# Patient Record
Sex: Male | Born: 1960 | Race: White | Hispanic: No | Smoking: Current every day smoker
Health system: Southern US, Community
[De-identification: ages and names within clinical notes are randomized; demographics above are authoritative.]

## PROBLEM LIST (undated history)

## (undated) DIAGNOSIS — I639 Cerebral infarction, unspecified: Secondary | ICD-10-CM

## (undated) DIAGNOSIS — N189 Chronic kidney disease, unspecified: Secondary | ICD-10-CM

## (undated) DIAGNOSIS — J9601 Acute respiratory failure with hypoxia: Secondary | ICD-10-CM

## (undated) DIAGNOSIS — I214 Non-ST elevation (NSTEMI) myocardial infarction: Secondary | ICD-10-CM

## (undated) DIAGNOSIS — I509 Heart failure, unspecified: Secondary | ICD-10-CM

## (undated) DIAGNOSIS — F329 Major depressive disorder, single episode, unspecified: Secondary | ICD-10-CM

## (undated) DIAGNOSIS — F319 Bipolar disorder, unspecified: Secondary | ICD-10-CM

## (undated) DIAGNOSIS — Z720 Tobacco use: Secondary | ICD-10-CM

## (undated) DIAGNOSIS — I255 Ischemic cardiomyopathy: Secondary | ICD-10-CM

## (undated) DIAGNOSIS — N183 Chronic kidney disease, stage 3 unspecified: Secondary | ICD-10-CM

## (undated) DIAGNOSIS — I5022 Chronic systolic (congestive) heart failure: Secondary | ICD-10-CM

## (undated) DIAGNOSIS — J449 Chronic obstructive pulmonary disease, unspecified: Secondary | ICD-10-CM

## (undated) DIAGNOSIS — R569 Unspecified convulsions: Secondary | ICD-10-CM

## (undated) DIAGNOSIS — I219 Acute myocardial infarction, unspecified: Secondary | ICD-10-CM

## (undated) DIAGNOSIS — I10 Essential (primary) hypertension: Secondary | ICD-10-CM

## (undated) DIAGNOSIS — J810 Acute pulmonary edema: Secondary | ICD-10-CM

## (undated) DIAGNOSIS — I2510 Atherosclerotic heart disease of native coronary artery without angina pectoris: Secondary | ICD-10-CM

## (undated) DIAGNOSIS — F32A Depression, unspecified: Secondary | ICD-10-CM

## (undated) DIAGNOSIS — Z95811 Presence of heart assist device: Secondary | ICD-10-CM

## (undated) HISTORY — DX: Chronic kidney disease, unspecified: N18.9

## (undated) HISTORY — PX: CORONARY ARTERY BYPASS GRAFT: SHX141

## (undated) HISTORY — DX: Heart failure, unspecified: I50.9

---

## 2012-07-13 DIAGNOSIS — S42002A Fracture of unspecified part of left clavicle, initial encounter for closed fracture: Secondary | ICD-10-CM | POA: Insufficient documentation

## 2015-07-09 DIAGNOSIS — J810 Acute pulmonary edema: Secondary | ICD-10-CM

## 2015-07-09 DIAGNOSIS — J9601 Acute respiratory failure with hypoxia: Secondary | ICD-10-CM

## 2015-07-09 HISTORY — DX: Acute pulmonary edema: J81.0

## 2015-07-09 HISTORY — DX: Acute respiratory failure with hypoxia: J96.01

## 2015-12-29 DIAGNOSIS — I10 Essential (primary) hypertension: Secondary | ICD-10-CM | POA: Insufficient documentation

## 2015-12-29 DIAGNOSIS — R7303 Prediabetes: Secondary | ICD-10-CM | POA: Insufficient documentation

## 2016-01-04 DIAGNOSIS — I340 Nonrheumatic mitral (valve) insufficiency: Secondary | ICD-10-CM | POA: Insufficient documentation

## 2016-01-06 DIAGNOSIS — Z95811 Presence of heart assist device: Secondary | ICD-10-CM

## 2016-01-06 HISTORY — DX: Presence of heart assist device: Z95.811

## 2016-08-09 DIAGNOSIS — I739 Peripheral vascular disease, unspecified: Secondary | ICD-10-CM | POA: Insufficient documentation

## 2016-08-09 DIAGNOSIS — F322 Major depressive disorder, single episode, severe without psychotic features: Secondary | ICD-10-CM | POA: Insufficient documentation

## 2016-08-09 DIAGNOSIS — F1921 Other psychoactive substance dependence, in remission: Secondary | ICD-10-CM | POA: Insufficient documentation

## 2016-08-09 DIAGNOSIS — F1011 Alcohol abuse, in remission: Secondary | ICD-10-CM | POA: Insufficient documentation

## 2016-08-28 DIAGNOSIS — I739 Peripheral vascular disease, unspecified: Secondary | ICD-10-CM | POA: Insufficient documentation

## 2016-11-19 DIAGNOSIS — E785 Hyperlipidemia, unspecified: Secondary | ICD-10-CM | POA: Insufficient documentation

## 2017-02-19 DIAGNOSIS — Z9181 History of falling: Secondary | ICD-10-CM | POA: Insufficient documentation

## 2017-06-20 DIAGNOSIS — W19XXXA Unspecified fall, initial encounter: Secondary | ICD-10-CM | POA: Insufficient documentation

## 2017-06-22 DIAGNOSIS — R338 Other retention of urine: Secondary | ICD-10-CM | POA: Insufficient documentation

## 2017-06-22 DIAGNOSIS — N401 Enlarged prostate with lower urinary tract symptoms: Secondary | ICD-10-CM | POA: Insufficient documentation

## 2017-06-26 DIAGNOSIS — I63512 Cerebral infarction due to unspecified occlusion or stenosis of left middle cerebral artery: Secondary | ICD-10-CM | POA: Insufficient documentation

## 2017-08-05 DIAGNOSIS — Z79899 Other long term (current) drug therapy: Secondary | ICD-10-CM | POA: Insufficient documentation

## 2017-10-11 DIAGNOSIS — R0789 Other chest pain: Secondary | ICD-10-CM | POA: Insufficient documentation

## 2018-09-17 DIAGNOSIS — M25562 Pain in left knee: Secondary | ICD-10-CM | POA: Insufficient documentation

## 2018-09-17 DIAGNOSIS — G8929 Other chronic pain: Secondary | ICD-10-CM | POA: Insufficient documentation

## 2018-11-03 ENCOUNTER — Inpatient Hospital Stay
Admission: EM | Admit: 2018-11-03 | Discharge: 2018-11-04 | DRG: 291 | Disposition: A | Discharge status: Home Health | Payer: Medicaid Other | Attending: Internal Medicine | Admitting: Internal Medicine

## 2018-11-03 ENCOUNTER — Inpatient Hospital Stay
Admit: 2018-11-03 | Discharge: 2018-11-03 | Disposition: A | Discharge status: Home / Self Care | Payer: Medicaid Other | Attending: Family Medicine | Admitting: Family Medicine

## 2018-11-03 ENCOUNTER — Other Ambulatory Visit: Payer: Self-pay

## 2018-11-03 ENCOUNTER — Emergency Department: Payer: Medicaid Other

## 2018-11-03 DIAGNOSIS — R778 Other specified abnormalities of plasma proteins: Secondary | ICD-10-CM

## 2018-11-03 DIAGNOSIS — Z8673 Personal history of transient ischemic attack (TIA), and cerebral infarction without residual deficits: Secondary | ICD-10-CM | POA: Diagnosis not present

## 2018-11-03 DIAGNOSIS — R0602 Shortness of breath: Secondary | ICD-10-CM | POA: Diagnosis present

## 2018-11-03 DIAGNOSIS — I447 Left bundle-branch block, unspecified: Secondary | ICD-10-CM | POA: Diagnosis present

## 2018-11-03 DIAGNOSIS — I2781 Cor pulmonale (chronic): Secondary | ICD-10-CM | POA: Diagnosis present

## 2018-11-03 DIAGNOSIS — Z79890 Hormone replacement therapy (postmenopausal): Secondary | ICD-10-CM

## 2018-11-03 DIAGNOSIS — E039 Hypothyroidism, unspecified: Secondary | ICD-10-CM | POA: Diagnosis present

## 2018-11-03 DIAGNOSIS — I252 Old myocardial infarction: Secondary | ICD-10-CM

## 2018-11-03 DIAGNOSIS — F17200 Nicotine dependence, unspecified, uncomplicated: Secondary | ICD-10-CM | POA: Diagnosis present

## 2018-11-03 DIAGNOSIS — J9621 Acute and chronic respiratory failure with hypoxia: Secondary | ICD-10-CM | POA: Diagnosis present

## 2018-11-03 DIAGNOSIS — Z91012 Allergy to eggs: Secondary | ICD-10-CM

## 2018-11-03 DIAGNOSIS — Z880 Allergy status to penicillin: Secondary | ICD-10-CM

## 2018-11-03 DIAGNOSIS — Z91018 Allergy to other foods: Secondary | ICD-10-CM

## 2018-11-03 DIAGNOSIS — Z881 Allergy status to other antibiotic agents status: Secondary | ICD-10-CM

## 2018-11-03 DIAGNOSIS — J9602 Acute respiratory failure with hypercapnia: Secondary | ICD-10-CM | POA: Diagnosis present

## 2018-11-03 DIAGNOSIS — I110 Hypertensive heart disease with heart failure: Principal | ICD-10-CM | POA: Diagnosis present

## 2018-11-03 DIAGNOSIS — R739 Hyperglycemia, unspecified: Secondary | ICD-10-CM | POA: Diagnosis present

## 2018-11-03 DIAGNOSIS — J9601 Acute respiratory failure with hypoxia: Secondary | ICD-10-CM | POA: Diagnosis present

## 2018-11-03 DIAGNOSIS — Z951 Presence of aortocoronary bypass graft: Secondary | ICD-10-CM

## 2018-11-03 DIAGNOSIS — E785 Hyperlipidemia, unspecified: Secondary | ICD-10-CM | POA: Diagnosis present

## 2018-11-03 DIAGNOSIS — R7989 Other specified abnormal findings of blood chemistry: Secondary | ICD-10-CM

## 2018-11-03 DIAGNOSIS — J441 Chronic obstructive pulmonary disease with (acute) exacerbation: Secondary | ICD-10-CM | POA: Diagnosis present

## 2018-11-03 DIAGNOSIS — N179 Acute kidney failure, unspecified: Secondary | ICD-10-CM | POA: Diagnosis present

## 2018-11-03 DIAGNOSIS — I2510 Atherosclerotic heart disease of native coronary artery without angina pectoris: Secondary | ICD-10-CM | POA: Diagnosis present

## 2018-11-03 DIAGNOSIS — I5043 Acute on chronic combined systolic (congestive) and diastolic (congestive) heart failure: Secondary | ICD-10-CM | POA: Diagnosis present

## 2018-11-03 DIAGNOSIS — R569 Unspecified convulsions: Secondary | ICD-10-CM | POA: Diagnosis present

## 2018-11-03 DIAGNOSIS — Z20828 Contact with and (suspected) exposure to other viral communicable diseases: Secondary | ICD-10-CM | POA: Diagnosis present

## 2018-11-03 DIAGNOSIS — Z95810 Presence of automatic (implantable) cardiac defibrillator: Secondary | ICD-10-CM | POA: Diagnosis not present

## 2018-11-03 DIAGNOSIS — J9600 Acute respiratory failure, unspecified whether with hypoxia or hypercapnia: Secondary | ICD-10-CM | POA: Diagnosis present

## 2018-11-03 DIAGNOSIS — I4892 Unspecified atrial flutter: Secondary | ICD-10-CM | POA: Diagnosis present

## 2018-11-03 DIAGNOSIS — Z91041 Radiographic dye allergy status: Secondary | ICD-10-CM

## 2018-11-03 DIAGNOSIS — F319 Bipolar disorder, unspecified: Secondary | ICD-10-CM | POA: Diagnosis present

## 2018-11-03 DIAGNOSIS — I255 Ischemic cardiomyopathy: Secondary | ICD-10-CM | POA: Diagnosis present

## 2018-11-03 DIAGNOSIS — Z882 Allergy status to sulfonamides status: Secondary | ICD-10-CM

## 2018-11-03 DIAGNOSIS — Z955 Presence of coronary angioplasty implant and graft: Secondary | ICD-10-CM

## 2018-11-03 DIAGNOSIS — I4891 Unspecified atrial fibrillation: Secondary | ICD-10-CM | POA: Diagnosis present

## 2018-11-03 DIAGNOSIS — I248 Other forms of acute ischemic heart disease: Secondary | ICD-10-CM | POA: Diagnosis present

## 2018-11-03 DIAGNOSIS — D649 Anemia, unspecified: Secondary | ICD-10-CM | POA: Diagnosis present

## 2018-11-03 DIAGNOSIS — I509 Heart failure, unspecified: Secondary | ICD-10-CM

## 2018-11-03 DIAGNOSIS — E872 Acidosis: Secondary | ICD-10-CM | POA: Diagnosis present

## 2018-11-03 HISTORY — DX: Chronic obstructive pulmonary disease, unspecified: J44.9

## 2018-11-03 HISTORY — DX: Tobacco use: Z72.0

## 2018-11-03 HISTORY — DX: Non-ST elevation (NSTEMI) myocardial infarction: I21.4

## 2018-11-03 HISTORY — DX: Cerebral infarction, unspecified: I63.9

## 2018-11-03 HISTORY — DX: Essential (primary) hypertension: I10

## 2018-11-03 HISTORY — DX: Depression, unspecified: F32.A

## 2018-11-03 HISTORY — DX: Bipolar disorder, unspecified: F31.9

## 2018-11-03 HISTORY — DX: Presence of heart assist device: Z95.811

## 2018-11-03 HISTORY — DX: Acute myocardial infarction, unspecified: I21.9

## 2018-11-03 HISTORY — DX: Unspecified convulsions: R56.9

## 2018-11-03 HISTORY — DX: Major depressive disorder, single episode, unspecified: F32.9

## 2018-11-03 HISTORY — DX: Acute respiratory failure with hypoxia: J96.01

## 2018-11-03 HISTORY — DX: Acute pulmonary edema: J81.0

## 2018-11-03 LAB — RESPIRATORY PANEL BY PCR

## 2018-11-03 LAB — PROCALCITONIN: Procalcitonin: <0.1 ng/mL

## 2018-11-03 LAB — LIPASE, BLOOD: Lipase: 38 U/L (ref 11–51)

## 2018-11-03 LAB — BLOOD GAS, VENOUS
Acid-base deficit: 19.7 mmol/L — ABNORMAL HIGH (ref 0.0–2.0)
Bicarbonate: 13.8 mmol/L — ABNORMAL LOW (ref 20.0–28.0)
Delivery systems: POSITIVE
FIO2: 0.7
O2 Saturation: 48.2 %
Patient temperature: 37
RATE: 10 resp/min
pCO2, Ven: 69 mmHg — ABNORMAL HIGH (ref 44.0–60.0)
pH, Ven: 6.91 — CL (ref 7.250–7.430)
pO2, Ven: 46 mmHg — ABNORMAL HIGH (ref 32.0–45.0)

## 2018-11-03 LAB — TROPONIN I
Troponin I: 0.05 ng/mL (ref <0.03)
Troponin I: 0.11 ng/mL (ref <0.03)
Troponin I: 0.08 ng/mL (ref <0.03)
Troponin I: 0.08 ng/mL (ref <0.03)

## 2018-11-03 LAB — TRIGLYCERIDES: Triglycerides: 220 mg/dL — ABNORMAL HIGH (ref <150)

## 2018-11-03 LAB — FIBRINOGEN: Fibrinogen: 355 mg/dL (ref 210–475)

## 2018-11-03 LAB — CBC
HCT: 33.6 % — ABNORMAL LOW (ref 39.0–52.0)
Hemoglobin: 11 g/dL — ABNORMAL LOW (ref 13.0–17.0)
MCH: 29.7 pg (ref 26.0–34.0)
MCHC: 32.7 g/dL (ref 30.0–36.0)
MCV: 90.8 fL (ref 80.0–100.0)
Platelets: 203 10*3/uL (ref 150–400)
RBC: 3.7 MIL/uL — ABNORMAL LOW (ref 4.22–5.81)
RDW: 15.9 % — ABNORMAL HIGH (ref 11.5–15.5)
WBC: 9.9 10*3/uL (ref 4.0–10.5)
nRBC: 0 % (ref 0.0–0.2)

## 2018-11-03 LAB — BASIC METABOLIC PANEL
Anion gap: 12 (ref 5–15)
BUN: 22 mg/dL — ABNORMAL HIGH (ref 6–20)
CO2: 17 mmol/L — ABNORMAL LOW (ref 22–32)
Calcium: 8.4 mg/dL — ABNORMAL LOW (ref 8.9–10.3)
Chloride: 108 mmol/L (ref 98–111)
Creatinine, Ser: 1.23 mg/dL (ref 0.61–1.24)
GFR calc Af Amer: >60 mL/min (ref >60)
GFR calc non Af Amer: >60 mL/min (ref >60)
Glucose, Bld: 160 mg/dL — ABNORMAL HIGH (ref 70–99)
Potassium: 3.7 mmol/L (ref 3.5–5.1)
Sodium: 137 mmol/L (ref 135–145)

## 2018-11-03 LAB — URINALYSIS, COMPLETE (UACMP) WITH MICROSCOPIC
Bacteria, UA: NONE SEEN
Bilirubin Urine: NEGATIVE
Glucose, UA: 50 mg/dL — AB
Ketones, ur: NEGATIVE mg/dL
Leukocytes,Ua: NEGATIVE
Nitrite: NEGATIVE
Protein, ur: 100 mg/dL — AB
Specific Gravity, Urine: 1.011 (ref 1.005–1.030)
Squamous Epithelial / HPF: NONE SEEN (ref 0–5)
pH: 6 (ref 5.0–8.0)

## 2018-11-03 LAB — CBC WITH DIFFERENTIAL/PLATELET
Abs Immature Granulocytes: 0.15 10*3/uL — ABNORMAL HIGH (ref 0.00–0.07)
Basophils Absolute: 0.1 10*3/uL (ref 0.0–0.1)
Basophils Relative: 1 %
Eosinophils Absolute: 0.4 10*3/uL (ref 0.0–0.5)
Eosinophils Relative: 2 %
HCT: 40.9 % (ref 39.0–52.0)
Hemoglobin: 12.3 g/dL — ABNORMAL LOW (ref 13.0–17.0)
Immature Granulocytes: 1 %
Lymphocytes Relative: 31 %
Lymphs Abs: 5.5 10*3/uL — ABNORMAL HIGH (ref 0.7–4.0)
MCH: 29.9 pg (ref 26.0–34.0)
MCHC: 30.1 g/dL (ref 30.0–36.0)
MCV: 99.3 fL (ref 80.0–100.0)
Monocytes Absolute: 1.7 10*3/uL — ABNORMAL HIGH (ref 0.1–1.0)
Monocytes Relative: 10 %
Neutro Abs: 10.1 10*3/uL — ABNORMAL HIGH (ref 1.7–7.7)
Neutrophils Relative %: 55 %
Platelets: 212 10*3/uL (ref 150–400)
RBC: 4.12 MIL/uL — ABNORMAL LOW (ref 4.22–5.81)
RDW: 16 % — ABNORMAL HIGH (ref 11.5–15.5)
WBC: 17.8 10*3/uL — ABNORMAL HIGH (ref 4.0–10.5)
nRBC: 0.3 % — ABNORMAL HIGH (ref 0.0–0.2)

## 2018-11-03 LAB — COMPREHENSIVE METABOLIC PANEL
ALT: 27 U/L (ref 0–44)
AST: 40 U/L (ref 15–41)
Albumin: 3.8 g/dL (ref 3.5–5.0)
Alkaline Phosphatase: 128 U/L — ABNORMAL HIGH (ref 38–126)
Anion gap: 25 — ABNORMAL HIGH (ref 5–15)
BUN: 18 mg/dL (ref 6–20)
CO2: 8 mmol/L — ABNORMAL LOW (ref 22–32)
Calcium: 9.1 mg/dL (ref 8.9–10.3)
Chloride: 106 mmol/L (ref 98–111)
Creatinine, Ser: 1.39 mg/dL — ABNORMAL HIGH (ref 0.61–1.24)
GFR calc Af Amer: >60 mL/min (ref >60)
GFR calc non Af Amer: 55 mL/min — ABNORMAL LOW (ref >60)
Glucose, Bld: 204 mg/dL — ABNORMAL HIGH (ref 70–99)
Potassium: 3.9 mmol/L (ref 3.5–5.1)
Sodium: 139 mmol/L (ref 135–145)
Total Bilirubin: 1.2 mg/dL (ref 0.3–1.2)
Total Protein: 7.6 g/dL (ref 6.5–8.1)

## 2018-11-03 LAB — BLOOD GAS, ARTERIAL
Acid-base deficit: 9.2 mmol/L — ABNORMAL HIGH (ref 0.0–2.0)
Bicarbonate: 15.1 mmol/L — ABNORMAL LOW (ref 20.0–28.0)
Delivery systems: POSITIVE
Expiratory PAP: 8
FIO2: 50
Inspiratory PAP: 14
Mechanical Rate: 10
O2 Saturation: 99.7 %
Patient temperature: 37
pCO2 arterial: 28 mmHg — ABNORMAL LOW (ref 32.0–48.0)
pH, Arterial: 7.34 — ABNORMAL LOW (ref 7.350–7.450)
pO2, Arterial: 215 mmHg — ABNORMAL HIGH (ref 83.0–108.0)

## 2018-11-03 LAB — FERRITIN: Ferritin: 201 ng/mL (ref 24–336)

## 2018-11-03 LAB — SARS CORONAVIRUS 2 BY RT PCR (HOSPITAL ORDER, PERFORMED IN ~~LOC~~ HOSPITAL LAB): SARS Coronavirus 2: NEGATIVE

## 2018-11-03 LAB — STREP PNEUMONIAE URINARY ANTIGEN: Strep Pneumo Urinary Antigen: NEGATIVE

## 2018-11-03 LAB — PROTIME-INR
INR: 1.1 (ref 0.8–1.2)
Prothrombin Time: 14.1 seconds (ref 11.4–15.2)

## 2018-11-03 LAB — C-REACTIVE PROTEIN: CRP: 0.9 mg/dL (ref <1.0)

## 2018-11-03 LAB — LACTIC ACID, PLASMA
Lactic Acid, Venous: 10.8 mmol/L (ref 0.5–1.9)
Lactic Acid, Venous: 4.1 mmol/L (ref 0.5–1.9)

## 2018-11-03 LAB — LACTATE DEHYDROGENASE: LDH: 226 U/L — ABNORMAL HIGH (ref 98–192)

## 2018-11-03 LAB — GLUCOSE, CAPILLARY: Glucose-Capillary: 115 mg/dL — ABNORMAL HIGH (ref 70–99)

## 2018-11-03 LAB — MRSA PCR SCREENING: MRSA by PCR: NEGATIVE

## 2018-11-03 LAB — BRAIN NATRIURETIC PEPTIDE: B Natriuretic Peptide: >4500 pg/mL — ABNORMAL HIGH (ref 0.0–100.0)

## 2018-11-03 LAB — FIBRIN DERIVATIVES D-DIMER (ARMC ONLY): Fibrin derivatives D-dimer (ARMC): 1643.47 ng/mL (FEU) — ABNORMAL HIGH (ref 0.00–499.00)

## 2018-11-03 LAB — TSH: TSH: 6.793 u[IU]/mL — ABNORMAL HIGH (ref 0.350–4.500)

## 2018-11-03 MED ORDER — METHYLPREDNISOLONE SODIUM SUCC 40 MG IJ SOLR
40.0000 mg | Freq: Four times a day (QID) | INTRAMUSCULAR | Status: DC
  Administered 2018-11-03: 40 mg via INTRAVENOUS
  Filled 2018-11-03: qty 1

## 2018-11-03 MED ORDER — METRONIDAZOLE IN NACL 5-0.79 MG/ML-% IV SOLN
500.0000 mg | Freq: Once | INTRAVENOUS | Status: AC
  Administered 2018-11-03: 500 mg via INTRAVENOUS
  Filled 2018-11-03: qty 100

## 2018-11-03 MED ORDER — IPRATROPIUM-ALBUTEROL 0.5-2.5 (3) MG/3ML IN SOLN: 3.0000 mL | RESPIRATORY_TRACT | Status: DC | PRN

## 2018-11-03 MED ORDER — BUDESONIDE 0.5 MG/2ML IN SUSP
0.5000 mg | Freq: Two times a day (BID) | RESPIRATORY_TRACT | Status: DC
  Administered 2018-11-03 – 2018-11-04 (×3): 0.5 mg via RESPIRATORY_TRACT
  Filled 2018-11-03 (×3): qty 2

## 2018-11-03 MED ORDER — SODIUM CHLORIDE 0.9 % IV BOLUS (SEPSIS): 500.0000 mL | Freq: Once | INTRAVENOUS | Status: DC

## 2018-11-03 MED ORDER — LISINOPRIL 5 MG PO TABS
2.5000 mg | ORAL_TABLET | Freq: Every day | ORAL | Status: DC
  Administered 2018-11-03 – 2018-11-04 (×2): 2.5 mg via ORAL
  Filled 2018-11-03 (×2): qty 1

## 2018-11-03 MED ORDER — TRAZODONE HCL 50 MG PO TABS
25.0000 mg | ORAL_TABLET | Freq: Every evening | ORAL | Status: DC | PRN
  Administered 2018-11-03: 25 mg via ORAL
  Filled 2018-11-03: qty 1

## 2018-11-03 MED ORDER — SODIUM CHLORIDE 0.9% FLUSH
3.0000 mL | Freq: Two times a day (BID) | INTRAVENOUS | Status: DC
  Administered 2018-11-03 – 2018-11-04 (×2): 3 mL via INTRAVENOUS

## 2018-11-03 MED ORDER — SODIUM CHLORIDE 0.9% FLUSH: 3.0000 mL | INTRAVENOUS | Status: DC | PRN

## 2018-11-03 MED ORDER — QUETIAPINE FUMARATE 25 MG PO TABS: 25.0000 mg | ORAL_TABLET | Freq: Every day | ORAL | Status: DC

## 2018-11-03 MED ORDER — METHYLPREDNISOLONE SODIUM SUCC 125 MG IJ SOLR
125.0000 mg | Freq: Once | INTRAMUSCULAR | Status: AC
  Administered 2018-11-03: 04:00:00 125 mg via INTRAVENOUS
  Filled 2018-11-03: qty 2

## 2018-11-03 MED ORDER — METOPROLOL TARTRATE 5 MG/5ML IV SOLN: 2.5000 mg | Freq: Four times a day (QID) | INTRAVENOUS | Status: DC | PRN

## 2018-11-03 MED ORDER — VITAMIN B-12 1000 MCG PO TABS
1000.0000 ug | ORAL_TABLET | Freq: Every day | ORAL | Status: DC
  Administered 2018-11-03 – 2018-11-04 (×2): 1000 ug via ORAL
  Filled 2018-11-03 (×2): qty 1

## 2018-11-03 MED ORDER — ACETAMINOPHEN 650 MG RE SUPP: 650.0000 mg | Freq: Four times a day (QID) | RECTAL | Status: DC | PRN

## 2018-11-03 MED ORDER — ASPIRIN EC 81 MG PO TBEC
81.0000 mg | DELAYED_RELEASE_TABLET | Freq: Every day | ORAL | Status: DC
  Administered 2018-11-03 – 2018-11-04 (×2): 81 mg via ORAL
  Filled 2018-11-03 (×2): qty 1

## 2018-11-03 MED ORDER — SODIUM CHLORIDE 0.9 % IV SOLN
1.0000 g | Freq: Three times a day (TID) | INTRAVENOUS | Status: DC
  Filled 2018-11-03 (×2): qty 1

## 2018-11-03 MED ORDER — QUETIAPINE FUMARATE 25 MG PO TABS
25.0000 mg | ORAL_TABLET | Freq: Every day | ORAL | Status: DC
  Administered 2018-11-04: 25 mg via ORAL
  Filled 2018-11-03: qty 1

## 2018-11-03 MED ORDER — MAGNESIUM HYDROXIDE 400 MG/5ML PO SUSP: 30.0000 mL | Freq: Every day | ORAL | Status: DC | PRN

## 2018-11-03 MED ORDER — IPRATROPIUM-ALBUTEROL 0.5-2.5 (3) MG/3ML IN SOLN
3.0000 mL | Freq: Once | RESPIRATORY_TRACT | Status: AC
  Administered 2018-11-03: 3 mL via RESPIRATORY_TRACT

## 2018-11-03 MED ORDER — TIOTROPIUM BROMIDE MONOHYDRATE 18 MCG IN CAPS
1.0000 | ORAL_CAPSULE | Freq: Every day | RESPIRATORY_TRACT | Status: DC
  Administered 2018-11-04: 18 ug via RESPIRATORY_TRACT
  Filled 2018-11-03: qty 5

## 2018-11-03 MED ORDER — SODIUM CHLORIDE 0.9 % IV SOLN
2.0000 g | Freq: Once | INTRAVENOUS | Status: AC
  Administered 2018-11-03: 03:00:00 2 g via INTRAVENOUS
  Filled 2018-11-03: qty 2

## 2018-11-03 MED ORDER — IPRATROPIUM-ALBUTEROL 0.5-2.5 (3) MG/3ML IN SOLN
3.0000 mL | Freq: Four times a day (QID) | RESPIRATORY_TRACT | Status: DC
  Administered 2018-11-03: 3 mL via RESPIRATORY_TRACT
  Filled 2018-11-03: qty 3

## 2018-11-03 MED ORDER — ENOXAPARIN SODIUM 40 MG/0.4ML ~~LOC~~ SOLN
40.0000 mg | SUBCUTANEOUS | Status: DC
  Administered 2018-11-03 – 2018-11-04 (×2): 40 mg via SUBCUTANEOUS
  Filled 2018-11-03 (×2): qty 0.4

## 2018-11-03 MED ORDER — VANCOMYCIN HCL 10 G IV SOLR: 1750.0000 mg | INTRAVENOUS | Status: DC

## 2018-11-03 MED ORDER — POTASSIUM CHLORIDE CRYS ER 20 MEQ PO TBCR
40.0000 meq | EXTENDED_RELEASE_TABLET | Freq: Once | ORAL | Status: AC
  Administered 2018-11-03: 16:00:00 40 meq via ORAL
  Filled 2018-11-03: qty 2

## 2018-11-03 MED ORDER — FUROSEMIDE 10 MG/ML IJ SOLN
40.0000 mg | Freq: Two times a day (BID) | INTRAMUSCULAR | Status: DC
  Administered 2018-11-03 – 2018-11-04 (×3): 40 mg via INTRAVENOUS
  Filled 2018-11-03 (×3): qty 4

## 2018-11-03 MED ORDER — LEVOFLOXACIN IN D5W 500 MG/100ML IV SOLN
500.0000 mg | INTRAVENOUS | Status: DC
  Administered 2018-11-03: 500 mg via INTRAVENOUS
  Filled 2018-11-03: qty 100

## 2018-11-03 MED ORDER — PREDNISONE 50 MG PO TABS
50.0000 mg | ORAL_TABLET | Freq: Every day | ORAL | Status: DC
  Administered 2018-11-04: 50 mg via ORAL
  Filled 2018-11-03: qty 1

## 2018-11-03 MED ORDER — ATORVASTATIN CALCIUM 20 MG PO TABS
80.0000 mg | ORAL_TABLET | Freq: Every day | ORAL | Status: DC
  Administered 2018-11-03 – 2018-11-04 (×2): 80 mg via ORAL
  Filled 2018-11-03 (×2): qty 4

## 2018-11-03 MED ORDER — QUETIAPINE FUMARATE 25 MG PO TABS
50.0000 mg | ORAL_TABLET | Freq: Every day | ORAL | Status: DC
  Administered 2018-11-03: 50 mg via ORAL
  Filled 2018-11-03: qty 2

## 2018-11-03 MED ORDER — GUAIFENESIN ER 600 MG PO TB12
600.0000 mg | ORAL_TABLET | Freq: Two times a day (BID) | ORAL | Status: DC
  Administered 2018-11-03 – 2018-11-04 (×3): 600 mg via ORAL
  Filled 2018-11-03 (×3): qty 1

## 2018-11-03 MED ORDER — ONDANSETRON HCL 4 MG PO TABS: 4.0000 mg | ORAL_TABLET | Freq: Four times a day (QID) | ORAL | Status: DC | PRN

## 2018-11-03 MED ORDER — SODIUM CHLORIDE 0.9 % IV BOLUS (SEPSIS)
1000.0000 mL | Freq: Once | INTRAVENOUS | Status: AC
  Administered 2018-11-03: 03:00:00 1000 mL via INTRAVENOUS

## 2018-11-03 MED ORDER — DIVALPROEX SODIUM 500 MG PO DR TAB
750.0000 mg | DELAYED_RELEASE_TABLET | Freq: Two times a day (BID) | ORAL | Status: DC
  Administered 2018-11-03 – 2018-11-04 (×2): 750 mg via ORAL
  Filled 2018-11-03 (×4): qty 1

## 2018-11-03 MED ORDER — VANCOMYCIN HCL 10 G IV SOLR
1750.0000 mg | Freq: Once | INTRAVENOUS | Status: AC
  Administered 2018-11-03: 1750 mg via INTRAVENOUS
  Filled 2018-11-03: qty 1750

## 2018-11-03 MED ORDER — PREDNISONE 20 MG PO TABS: 40.0000 mg | ORAL_TABLET | Freq: Every day | ORAL | Status: DC

## 2018-11-03 MED ORDER — ASPIRIN EC 81 MG PO TBEC: 81.0000 mg | DELAYED_RELEASE_TABLET | Freq: Every day | ORAL | Status: DC

## 2018-11-03 MED ORDER — VANCOMYCIN HCL IN DEXTROSE 1-5 GM/200ML-% IV SOLN: 1000.0000 mg | Freq: Once | INTRAVENOUS | Status: DC

## 2018-11-03 MED ORDER — IPRATROPIUM-ALBUTEROL 0.5-2.5 (3) MG/3ML IN SOLN
RESPIRATORY_TRACT | Status: AC
  Administered 2018-11-03: 02:00:00 3 mL via RESPIRATORY_TRACT
  Filled 2018-11-03: qty 9

## 2018-11-03 MED ORDER — ONDANSETRON HCL 4 MG/2ML IJ SOLN: 4.0000 mg | Freq: Four times a day (QID) | INTRAMUSCULAR | Status: DC | PRN

## 2018-11-03 MED ORDER — IPRATROPIUM-ALBUTEROL 0.5-2.5 (3) MG/3ML IN SOLN
3.0000 mL | Freq: Three times a day (TID) | RESPIRATORY_TRACT | Status: DC
  Administered 2018-11-03 – 2018-11-04 (×3): 3 mL via RESPIRATORY_TRACT
  Filled 2018-11-03 (×3): qty 3

## 2018-11-03 MED ORDER — LEVOTHYROXINE SODIUM 50 MCG PO TABS
25.0000 ug | ORAL_TABLET | Freq: Every day | ORAL | Status: DC
  Administered 2018-11-04: 06:00:00 25 ug via ORAL
  Filled 2018-11-03: qty 1

## 2018-11-03 MED ORDER — CLOPIDOGREL BISULFATE 75 MG PO TABS
75.0000 mg | ORAL_TABLET | Freq: Every day | ORAL | Status: DC
  Administered 2018-11-03 – 2018-11-04 (×2): 75 mg via ORAL
  Filled 2018-11-03 (×2): qty 1

## 2018-11-03 MED ORDER — SODIUM CHLORIDE 0.9 % IV BOLUS (SEPSIS): 1000.0000 mL | Freq: Once | INTRAVENOUS | Status: DC

## 2018-11-03 MED ORDER — FAMOTIDINE 20 MG PO TABS
20.0000 mg | ORAL_TABLET | Freq: Two times a day (BID) | ORAL | Status: DC
  Administered 2018-11-03 – 2018-11-04 (×2): 20 mg via ORAL
  Filled 2018-11-03 (×2): qty 1

## 2018-11-03 MED ORDER — CARVEDILOL 6.25 MG PO TABS
6.2500 mg | ORAL_TABLET | Freq: Two times a day (BID) | ORAL | Status: DC
  Administered 2018-11-03 – 2018-11-04 (×2): 6.25 mg via ORAL
  Filled 2018-11-03 (×2): qty 1

## 2018-11-03 MED ORDER — ACETAMINOPHEN 325 MG PO TABS
650.0000 mg | ORAL_TABLET | Freq: Four times a day (QID) | ORAL | Status: DC | PRN
  Administered 2018-11-03: 20:00:00 650 mg via ORAL
  Filled 2018-11-03: qty 2

## 2018-11-03 MED ORDER — FLUOXETINE HCL 20 MG PO CAPS
20.0000 mg | ORAL_CAPSULE | Freq: Every day | ORAL | Status: DC
  Administered 2018-11-03 – 2018-11-04 (×2): 20 mg via ORAL
  Filled 2018-11-03 (×2): qty 1

## 2018-11-03 MED ORDER — SODIUM CHLORIDE 0.9 % IV SOLN: 250.0000 mL | INTRAVENOUS | Status: DC | PRN

## 2018-11-03 NOTE — Progress Notes (Signed)
[facility] [place] [facility]   PATIENT NAME: Carl [name]    MR#:  [id number]  DATE OF BIRTH:  1960-07-17  SUBJECTIVE:  patient came in with acute onset shortness of breath the night before. He feels a lot better. Patient was diuresis with IV Lasix. He is currently 94% on room air. Denies any chest pain any productive cough or fever.  REVIEW OF SYSTEMS:   Review of Systems  Constitutional: Negative for chills, fever and weight loss.  HENT: Negative for ear discharge, ear pain and nosebleeds.   Eyes: Negative for blurred vision, pain and discharge.  Respiratory: Negative for sputum production, shortness of breath, wheezing and stridor.   Cardiovascular: Negative for chest pain, palpitations, orthopnea and PND.  Gastrointestinal: Negative for abdominal pain, diarrhea, nausea and vomiting.  Genitourinary: Negative for frequency and urgency.  Musculoskeletal: Negative for back pain and joint pain.  Neurological: Positive for weakness. Negative for sensory change, speech change and focal weakness.  Psychiatric/Behavioral: Negative for depression and hallucinations. The patient is not nervous/anxious.    Tolerating Diet:yes Tolerating PT:   DRUG ALLERGIES:   Allergies  Allergen Reactions  . Iodinated Diagnostic Agents Shortness Of Breath  . Penicillins Anaphylaxis and Shortness Of Breath    Respiratory  Tolerated cefuroxime on 01/06/16  . Strawberry Extract Anaphylaxis  . Cefepime Itching    Empiric antibiotic, developed pruritis.   . Eggs Or Egg-Derived Products Itching    Medicine related, pt tolerates ingestion of eggs  . Erythromycin Itching  . Sulfa Antibiotics Itching, Nausea And Vomiting and Nausea Only    VITALS:  Blood pressure 128/79, pulse 80, temperature 98.2 F (36.8 C), temperature source Oral, resp. rate (!) 22, height 5\' 9"  (1.753 m), weight 83.8 kg, SpO2 98 %.  PHYSICAL EXAMINATION:   Physical Exam  GENERAL:  58 y.o.-year-old  patient lying in the bed with no acute distress. He is chronically ill EYES: Pupils equal, round, reactive to light and accommodation. No scleral icterus. Extraocular muscles intact.  HEENT: Head atraumatic, normocephalic. Oropharynx and nasopharynx clear. edentulous NECK:  Supple, no jugular venous distention. No thyroid enlargement, no tenderness.  LUNGS: decreased breath sounds bilaterally, no wheezing, rales, rhonchi. No use of accessory muscles of respiration.  CARDIOVASCULAR: S1, S2 normal. No murmurs, rubs, or gallops.  ABDOMEN: Soft, nontender, nondistended. Bowel sounds present. No organomegaly or mass.  EXTREMITIES: No cyanosis, clubbing or edema b/l.    NEUROLOGIC: Cranial nerves II through XII are intact. No focal Motor or sensory deficits b/l.   PSYCHIATRIC:  patient is alert and oriented x 3.  SKIN: No obvious rash, lesion, or ulcer.   LABORATORY PANEL:  CBC Recent Labs  Lab 11/03/18 0842  WBC 9.9  HGB 11.0*  HCT 33.6*  PLT 203    Chemistries  Recent Labs  Lab 11/03/18 0205 11/03/18 0748  NA 139 137  K 3.9 3.7  CL 106 108  CO2 8* 17*  GLUCOSE 204* 160*  BUN 18 22*  CREATININE 1.39* 1.23  CALCIUM 9.1 8.4*  AST 40  --   ALT 27  --   ALKPHOS 128*  --   BILITOT 1.2  --    Cardiac Enzymes Recent Labs  Lab 11/03/18 0748  TROPONINI 0.11*   RADIOLOGY:  Dg Chest Portable 1 View  Result Date: 11/03/2018 CLINICAL DATA:  58 year old male with increased shortness of breath, wheezing. EXAM: PORTABLE CHEST 1 VIEW COMPARISON:  None. FINDINGS: Portable AP upright view at 0205 hours. Cardiomegaly  with left chest cardiac AICD. Prior median sternotomy and cardiac valve replacement. Other mediastinal contours are within normal limits. Normal lung volumes with mildly increased bilateral pulmonary interstitial opacity, symmetric. No pneumothorax, pleural effusion or consolidation. Prior instrumentation of the left clavicle. Visualized tracheal air column is within normal  limits. IMPRESSION: Cardiomegaly with increased bilateral pulmonary interstitial opacity. Consider acute interstitial edema versus viral/atypical respiratory infection. No pleural effusion identified. Electronically Signed   By: Odessa FlemingH  Hall M.D.   On: 11/03/2018 02:54   ASSESSMENT AND PLAN:  Carl AsalWiley [name]  is a 58 y.o. male with a known history of multiple medical problems that will be mentioned below including COPD, CHF, coronary artery disease status post CABG and stents and pacemaker, who presented to the emergency room with acute onset of severe respiratory distress  #1.  Acute respiratory failure likely secondary to severe COPD acute exacerbation  acute cor pulmonale with acute on chronic CHF likely systolic given history of ischemic cardiomyopathy  (EF 30%--he has AICD) -  I doubt pneumonia in his case and severe sepsis despite significantly elevated lactic acid level given his normal procalcitonin.  His lactic acid level elevation could be from acute CHF and acute respiratory failure.   -The patient was COVID-19 negative.   -patient diuresis well. He is saturating 94% on room air. Denies any cough shortness of breath or chest pain. No fever. I discontinued IV antibiotics. I have discussed this with the ICU attending. -Taper steroids.  2.  Coronary artery disease status post CABG status post PCI and stents.  l continue aspirin, beta-blocker therapy as well as statin therapy once medications are reconciled.  3.  Hypothyroidism.  His Synthroid to be continued   4.  Hypertension. continue his lisinopril and Coreg.  5.  Depression and bipolar disorder.  continue Prozac and Depakote.  6.  History of CVA and seizures.  Aspirin and Depakote XR   7.  Dyslipidemia.  Statin therapy will be resumed  8.  DVT prophylaxis.  lovenox    TOTAL TIME TAKING CARE OF THIS PATIENT: **30 minutes.  >50% time spent on counselling and coordination of care  POSSIBLE D/C IN *1-2 DAYS, DEPENDING ON CLINICAL  CONDITION.  Note: This dictation was prepared with Dragon dictation along with smaller phrase technology. Any transcriptional errors that result from this process are unintentional.  Enedina FinnerSona Fara Worthy M.D on 11/03/2018 at 2:58 PM  Between 7am to 6pm - Pager - [phone]  After 6pm go to [email or web address] - Social research officer, governmentpassword [email or web address]  [facility]  Office  [phone]  CC: Primary care physician; [facility] FacultyPatient ID: Carl [name], male   DOB: [birth year], 58 y.o.   MRN: [id number]

## 2018-11-03 NOTE — TOC Initial Note (Signed)
Transition of Care [name]) - Initial/Assessment Note    Patient Details  Name: Carl [name] MRN: [id number] Date of Birth: [birth year]  [facility] of [name]) CM/SW Contact:    [name], RN Phone Number: 11/03/2018, 3:40 PM  Clinical Narrative:        Patient is from from with 2 daughters and a son in law.  Admitted with respiratory distress.  History of COPD; CHF.  Does not have home oxygen or CPAP; on room air.  Has a functioning scale at home and weighs daily.  Current with PCP at [facility]-Dr. Theresia Lo.    Obtains medications at [facility].   [name] with [employer] is aware of COPD diagnoses and possible need for NIV.  Patient states daughter transports him to appointments.  States he feels safe at home.  He has a cane and a walker at home.          Expected Discharge Plan: Home/Self Care Barriers to Discharge: Continued Medical Work up   Patient Goals and CMS Choice        Expected Discharge Plan and Services Expected Discharge Plan: Home/Self Care   Discharge Planning Services: CM Consult   Living arrangements for the past 2 months: Single Family Home                                      Prior Living Arrangements/Services Living arrangements for the past 2 months: Single Family Home Lives with:: Adult Children Patient language and need for interpreter reviewed:: Yes Do you feel safe going back to the place where you live?: Yes            Criminal Activity/Legal Involvement Pertinent to Current Situation/Hospitalization: No - Comment as needed  Activities of Daily Living Home Assistive Devices/Equipment: None ADL Screening (condition at time of admission) Patient's cognitive ability adequate to safely complete daily activities?: Yes Is the patient deaf or have difficulty hearing?: No Does the patient have difficulty seeing, even when wearing glasses/contacts?: No Does the patient have difficulty concentrating, remembering, or  making decisions?: No Patient able to express need for assistance with ADLs?: Yes Does the patient have difficulty dressing or bathing?: No Independently performs ADLs?: Yes (appropriate for developmental age) Does the patient have difficulty walking or climbing stairs?: Yes Weakness of Legs: Left Weakness of Arms/Hands: None  Permission Sought/Granted                  Emotional Assessment Appearance:: Appears stated age Attitude/Demeanor/Rapport: Gracious Affect (typically observed): Accepting Orientation: : Oriented to Self, Oriented to Place, Oriented to  Time, Oriented to Situation Alcohol / Substance Use: Not Applicable    Admission diagnosis:  Demand ischemia (HCC) [I24.8] COPD exacerbation (HCC) [J44.1] Elevated troponin I level [R79.89] Elevated lactic acid level [R79.89] Acute respiratory failure with hypoxia and hypercapnia (HCC) [J96.01, J96.02] Acute on chronic congestive heart failure, unspecified heart failure type [facility]) [I50.9] Patient Active Problem List   Diagnosis Date Noted  . Acute respiratory failure (HCC) 11/03/2018   PCP:  [facility] Pharmacy:  No Pharmacies Listed    Social Determinants of Health (SDOH) Interventions    Readmission Risk Interventions Readmission Risk Prevention Plan 11/03/2018  Transportation Screening Complete  PCP or Specialist Appt within 5-7 Days Not Complete  Not Complete comments still in ICU  Home Care Screening Complete  Medication Review (RN CM) Complete

## 2018-11-03 NOTE — Progress Notes (Signed)
OT Cancellation Note  Patient Details Name: Carl [name] MRN: [id number] DOB: [birth year]   Cancelled Treatment:    Reason Eval/Treat Not Completed: Patient not medically ready. Consult received, chart reviewed. Pt noted to have been taken off BiPAP this am. Will hold OT evaluation this morning to ensure pt tolerates being off BiPAP well. Will continue to follow and re-attempt at later time as medically appropriate.   Richrd Prime, MPH, MS, OTR/L ascom [phone] 11/03/18, 10:46 AM

## 2018-11-03 NOTE — Progress Notes (Signed)
eLink Physician-Brief Progress Note Patient Name: Carl [name] DOB: 1960/11/22 MRN: [id number]   Date of Service  11/03/2018  HPI/Events of Note  58 y.o. male with a known history of multiple medical problems that will be mentioned below including COPD, CHF, coronary artery disease status post CABG and pacemaker, who presented to the emergency room with acute onset of severe respiratory distress.  He has been having dry cough as well as dyspnea and wheezing yesterday and significantly got worse last night.  He admitted to associated orthopnea and paroxysmal nocturnal dyspnea without significant lower extremity edema.  He was de-satting to 85% on room air per EMS and it was up to 100 percent on nonrebreather.  He was still in respiratory distress upon arrival to the ER that he had to be placed on BiPAP. COVID-19 negative. PCCM asked to assume care. VSS.  eICU Interventions  No new orders.      Intervention Category Evaluation Type: New Patient Evaluation  [name] 11/03/2018, 5:06 AM

## 2018-11-03 NOTE — Evaluation (Signed)
Physical Therapy Evaluation Patient Details Name: Carl [name] MRN: [id number] DOB: August 24, 1960 Today's Date: 11/03/2018   History of Present Illness  Patient is a pleasant 58 y.o. male with a known history of multiple medical problems including COPD, CHF, CAD s/p CABG and stents and pacemaker, Bipolar 1 disorder, HTN, depression, MI, NSTEMI, RVAD, seizures, ischemic cardiomyopathy with EF of 20%, and CVA who presented to the emergency room with acute onset of severe respiratory distress. Placed on nonrebreather with EMS. On BiPAP which was removed 4/28 am. COVID-19 negative.  Patient has history of L sided weakness and is very verbose and tangential making history challenging to obtain.  Clinical Impression  Patient is a pleasant 58 year old male who presents with LLE weakness and limited stability in standing. Patient is pleasantly verbose with tangential nature of conversation making PLOF and history challenging to obtain due to changing of stories frequently. Patient mod I in bed mobility, however demonstrates instability with standing mobility due to LLE buckling. Patient transfers with CGA and RW into standing position however does not utilize LLE. Upon attempt at weight shift onto LLE patient's knee buckled 5/5 tries. Ambulation not attempted due to inability to march in place with RW for safety. Patient required assistance retaining COM with L knee buckling.  During hospitalization patient will benefit from skilled physical therapy to increase LE strength, mobility, and decrease fall's risk. Due to patient's limited ability to ambulate safety and history of high fall rate patient would benefit from placement in SNF upon discharge from PT.     Follow Up Recommendations SNF    Equipment Recommendations  3in1 (PT);Wheelchair (measurements PT)    Recommendations for Other Services       Precautions / Restrictions Precautions Precautions: Fall;ICD/Pacemaker Precaution Comments: watch  HR/O2 Restrictions Weight Bearing Restrictions: No Other Position/Activity Restrictions: L knee buckling in standing       Mobility  Bed Mobility Overal bed mobility: Modified Independent             General bed mobility comments: cues to initiate and complete EOB but no physical assist  Transfers Overall transfer level: Needs assistance Equipment used: Standard walker Transfers: Sit to/from Stand Sit to Stand: Min guard        Lateral/Scoot Transfers: Min guard General transfer comment: performs transfer with BUE support and RLE support, heavy weight shift on RLE.   Ambulation/Gait             General Gait Details: not safe for ambulation at this time, LLE buckled 5/5 attempts to weight shift with static marching requiring PT and patient to catch self to stop from falling.   Stairs            Wheelchair Mobility    Modified Rankin (Stroke Patients Only)       Balance Overall balance assessment: Needs assistance;History of Falls Sitting-balance support: Feet supported Sitting balance-Leahy Scale: Fair Sitting balance - Comments: good reach within and outside BOS without LOB Postural control: Right lateral lean Standing balance support: Bilateral upper extremity supported;During functional activity Standing balance-Leahy Scale: Poor Standing balance comment: Requires BUE support for functional activities, LLE buckling with weight aceptance.                High Level Balance Comments: unable to perform              Pertinent Vitals/Pain Pain Assessment: 0-10 Pain Score: 8  Pain Location: L knee Pain Descriptors / Indicators: Aching Pain Intervention(s): Limited activity within  patient's tolerance;Monitored during session;Repositioned    Home Living Family/patient expects to be discharged to:: Private residence Living Arrangements: Children(daughter and son in law and step daughter) Available Help at Discharge: Family(not sure how much  they can help, daughter has mental issues per patient report) Type of Home: House Home Access: Stairs to enter Entrance Stairs-Rails: Can reach both Entrance Stairs-Number of Steps: 3-4 Home Layout: One level Home Equipment: Dance movement psychotherapist - 2 wheels;Other (comment)(lofstrand crutches, hurrycane, crutches) Additional Comments:  pt unreliable historian    Prior Function Level of Independence: Needs assistance   Gait / Transfers Assistance Needed: Per patient he falls a lot when he tries to walk, able to sometimes make it to the restroom in time but has difficulty with his left knee. Uses the power scooters at stores: daughter brings it to car and he scoots onto it from car seat.   ADL's / Homemaking Assistance Needed: per pt, family assists with IADL, but pt doesn't trust daughter and son in law so he manages his own medications so they don't go missing, family assists with showering and occasionally with LB dressing when L knee is acting up  Comments: pt endorses 25-30 falls in past 12 months primarily due to L knee buckling     Hand Dominance   Dominant Hand: (ambidextrous per pt)    Extremity/Trunk Assessment   Upper Extremity Assessment Upper Extremity Assessment: Defer to OT evaluation    Lower Extremity Assessment Lower Extremity Assessment: LLE deficits/detail(RLE 4+/5) LLE Deficits / Details: LLE: 3/5 gross assessment, unable to apply pressure due to pain with movements, slight tone noted in LLE LLE: Unable to fully assess due to pain LLE Sensation: decreased light touch LLE Coordination: decreased gross motor    Cervical / Trunk Assessment Cervical / Trunk Assessment: Normal  Communication   Communication: No difficulties  Cognition Arousal/Alertness: Awake/alert Behavior During Therapy: WFL for tasks assessed/performed Overall Cognitive Status: No family/caregiver present to determine baseline cognitive functioning                                  General Comments: pt endorses STM deficits since previous CVA, requires cues and redirection to answer simple questions as pt has tendency to be very verbose and extraneous in detail, follows commands fairly well, oriented to self, place, date, and somewhat to situation      General Comments General comments (skin integrity, edema, etc.): tone of LLE, scarring of L knee    Exercises Other Exercises Other Exercises: patient educated in safe transfers for mobility, Patient educated on techniques to reduce L knee buckling for safety   Assessment/Plan    PT Assessment Patient needs continued PT services  PT Problem List Decreased strength;Decreased activity tolerance;Decreased coordination;Decreased mobility;Decreased balance;Decreased knowledge of use of DME;Decreased safety awareness;Pain       PT Treatment Interventions DME instruction;Gait training;Stair training;Therapeutic exercise;Therapeutic activities;Functional mobility training;Balance training;Neuromuscular re-education;Manual techniques;Wheelchair mobility training;Patient/family education    PT Goals (Current goals can be found in the Care Plan section)  Acute Rehab PT Goals Patient Stated Goal: to get out of the hospital PT Goal Formulation: With patient Time For Goal Achievement: 11/17/18 Potential to Achieve Goals: Fair    Frequency Min 2X/week   Barriers to discharge Inaccessible home environment;Decreased caregiver support patient has steps to enter home and inconsistant assistance    Co-evaluation               AM-PAC  PT "6 Clicks" Mobility  Outcome Measure Help needed turning from your back to your side while in a flat bed without using bedrails?: None Help needed moving from lying on your back to sitting on the side of a flat bed without using bedrails?: A Little Help needed moving to and from a bed to a chair (including a wheelchair)?: A Little Help needed standing up from a chair using your  arms (e.g., wheelchair or bedside chair)?: A Little Help needed to walk in hospital room?: A Lot Help needed climbing 3-5 steps with a railing? : Total 6 Click Score: 16    End of Session Equipment Utilized During Treatment: Gait belt Activity Tolerance: Patient tolerated treatment well;Patient limited by pain(L knee pain ) Patient left: in bed;with call bell/phone within reach;with bed alarm set Nurse Communication: Mobility status;Other (comment)(L knee buckling, can SPT to bedside commode but will need CGA due to buckling ) PT Visit Diagnosis: Unsteadiness on feet (R26.81);Other abnormalities of gait and mobility (R26.89);Repeated falls (R29.6);Muscle weakness (generalized) (M62.81);History of falling (Z91.81);Difficulty in walking, not elsewhere classified (R26.2);Pain Pain - Right/Left: Left Pain - part of body: Knee    Time: 1455-1520 PT Time Calculation (min) (ACUTE ONLY): 25 min   Charges:   PT Evaluation $PT Eval Moderate Complexity: 1 Mod PT Treatments $Therapeutic Activity: 23-37 mins        Precious BardMarina Daysean Tinkham, PT, DPT    Precious BardMarina Kendricks Reap 11/03/2018, 4:23 PM

## 2018-11-03 NOTE — Evaluation (Signed)
Occupational Therapy Evaluation Patient Details Name: Carl [name] MRN: [id number] DOB: [birth year] Today's Date: 11/03/2018    History of Present Illness 58 y.o. male with a known history of multiple medical problems including COPD, CHF, CAD s/p CABG and stents and pacemaker, Bipolar 1 disorder, HTN, depression, MI, NSTEMI, RVAD, seizures, ischemic cardiomyopathy with EF of 20%, and CVA who presented to the emergency room with acute onset of severe respiratory distress. Placed on nonrebreather with EMS. On BiPAP which was removed 4/28 am. COVID-19 negative.    Clinical Impression   Pt seen for OT evaluation this date. Per pt, he required some assist with ADL/IADL including transportation, showering, and sometimes LB dressing. Difficult to determine pt's PLOF for mobility and use of AD as pt speaks to previous rehab and hospitalizations where he was using a w/c and crutches. Sounds like family has been assisting with mobility with AD (unable to verify this). Pt endorses previous STM deficits. Throughout session pt required cues to redirect and focus pt to answer specific questions asked. VSS throughout session and pt denies SOB. Pt required supervision for bed mobility, CGA for lateral transfers. Pt endorses L knee pain at baseline which limits him and has contributed to his 25-30 falls in past 12 months. Pt reports 8/10 L knee pain during session. RN notified. Pt requires MOD Assist for LB ADL tasks from seated position 2/2 L knee pain and poor activity tolerance. Pt instructed in energy conservation conservation strategies including pursed lip breathing, activity pacing, home/routines modifications, work simplification, AE/DME, prioritizing of meaningful occupations, and falls prevention. Pt would benefit from additional skilled OT services to maximize recall and carryover of learned techniques and facilitate implementation of learned techniques into daily routines. Upon discharge, recommend HHOT services  and 24/7 supervision pending progress with therapy during hospital stay.    Follow Up Recommendations  Home health OT;Supervision/Assistance - 24 hour    Equipment Recommendations  Toilet rise with handles    Recommendations for Other Services       Precautions / Restrictions Precautions Precautions: Fall;ICD/Pacemaker Precaution Comments: watch HR/O2 Restrictions Weight Bearing Restrictions: No Other Position/Activity Restrictions: pt reports hx of L knee pain/dysfunction resulting in buckling and significant number of falls      Mobility Bed Mobility Overal bed mobility: Modified Independent             General bed mobility comments: cues to initiate and complete EOB but no physical assist  Transfers Overall transfer level: Needs assistance   Transfers: Lateral/Scoot Transfers          Lateral/Scoot Transfers: Min guard      Balance                                           ADL either performed or assessed with clinical judgement   ADL Overall ADL's : Needs assistance/impaired Eating/Feeding: Independent   Grooming: Independent   Upper Body Bathing: Sitting;Set up;Supervision/ safety   Lower Body Bathing: Moderate assistance;Sit to/from stand   Upper Body Dressing : Sitting;Set up;Supervision/safety   Lower Body Dressing: Sit to/from stand;Moderate assistance                       Vision Baseline Vision/History: Wears glasses Wears Glasses: At all times(doesn't have them now) Patient Visual Report: No change from baseline Vision Assessment?: No apparent visual deficits     Perception  Praxis      Pertinent Vitals/Pain Pain Assessment: 0-10 Pain Score: 8  Pain Location: L knee Pain Descriptors / Indicators: Aching Pain Intervention(s): Limited activity within patient's tolerance;Monitored during session;Repositioned;Patient requesting pain meds-RN notified     Hand Dominance (ambidextrous per pt)    Extremity/Trunk Assessment Upper Extremity Assessment Upper Extremity Assessment: Overall WFL for tasks assessed   Lower Extremity Assessment Lower Extremity Assessment: LLE deficits/detail(RLE WFL) LLE Deficits / Details: significant L knee pain and some minor swelling noted which pt reports is baseline and not new, does functionally limit him, unable to fully assess LLE: Unable to fully assess due to pain   Cervical / Trunk Assessment Cervical / Trunk Assessment: Normal   Communication Communication Communication: No difficulties   Cognition Arousal/Alertness: Awake/alert Behavior During Therapy: WFL for tasks assessed/performed Overall Cognitive Status: No family/caregiver present to determine baseline cognitive functioning                                 General Comments: pt endorses STM deficits since previous CVA, requires cues and redirection to answer simple questions as pt has tendency to be very verbose and extraneous in detail, follows commands fairly well, oriented to self, place, date, and somewhat to situation   General Comments       Exercises Other Exercises Other Exercises: pt educated in ECS basics to support indep/safety with ADL tasks while minimizing SOB/over exertion leading to hospitalization - pt will benefit from additional instruction to support recall and carryover, particularly given STM deficits    Shoulder Instructions      Home Living Family/patient expects to be discharged to:: Private residence Living Arrangements: Children(daughter and son in law and step daughter) Available Help at Discharge: Family(pt reports he cannot trust his daughter and son in law) Type of Home: House Home Access: Stairs to enter Secretary/administrator of Steps: 3-4 Entrance Stairs-Rails: Can reach both Home Layout: One level     Bathroom Shower/Tub: Producer, television/film/video: Standard     Home Equipment: Horticulturist, commercial  Comments: loftstrand crutches? pt unreliable historian      Prior Functioning/Environment Level of Independence: Needs assistance  Gait / Transfers Assistance Needed: per pt, he was ambulating with family assist and no AD just prior to this hospitalization but reports he was at one time using a w/c and lofstrand crutches following a CVA and time at [facility] hospital ADL's / Homemaking Assistance Needed: per pt, family assists with IADL, but pt doesn't trust daughter and son in law so he manages his own medications so they don't go missing, family assists with showering and occasionally with LB dressing when L knee is acting up   Comments: pt endorses 25-30 falls in past 12 months primarily due to L knee buckling        OT Problem List: Decreased strength;Decreased range of motion;Decreased knowledge of use of DME or AE;Decreased cognition;Cardiopulmonary status limiting activity;Pain;Impaired balance (sitting and/or standing)      OT Treatment/Interventions: Self-care/ADL training;Balance training;Therapeutic exercise;Therapeutic activities;DME and/or AE instruction;Energy conservation;Patient/family education;Cognitive remediation/compensation    OT Goals(Current goals can be found in the care plan section) Acute Rehab OT Goals Patient Stated Goal: to get out of the hospital OT Goal Formulation: With patient Time For Goal Achievement: 11/17/18 Potential to Achieve Goals: Good ADL Goals Pt Will Perform Lower Body Dressing: with supervision;with adaptive equipment;sit to/from stand Pt Will Transfer to Toilet:  ambulating;bedside commode(with LRAD for amb, elevated commode) Additional ADL Goal #1: Pt will verbalize plan to implement at least 2 learned ECS to maximize safety/indep with ADL while minimizing SOB/over exertion.  OT Frequency: Min 1X/week   Barriers to D/C:            Co-evaluation              AM-PAC OT "6 Clicks" Daily Activity     Outcome Measure Help from another  person eating meals?: None Help from another person taking care of personal grooming?: None Help from another person toileting, which includes using toliet, bedpan, or urinal?: A Little Help from another person bathing (including washing, rinsing, drying)?: A Lot Help from another person to put on and taking off regular upper body clothing?: A Little Help from another person to put on and taking off regular lower body clothing?: A Lot 6 Click Score: 18   End of Session Nurse Communication: Patient requests pain meds  Activity Tolerance: Patient tolerated treatment well Patient left: in bed;with call bell/phone within reach;with bed alarm set;Other (comment)(echo in room)  OT Visit Diagnosis: Other abnormalities of gait and mobility (R26.89);Repeated falls (R29.6);Pain Pain - Right/Left: Left Pain - part of body: Knee                Time: 1325-1419 OT Time Calculation (min): 54 min Charges:  OT General Charges $OT Visit: 1 Visit OT Evaluation $OT Eval Moderate Complexity: 1 Mod OT Treatments $Self Care/Home Management : 23-37 mins  Richrd PrimeJamie Stiller, MPH, MS, OTR/L ascom [phone] 11/03/18, 3:05 PM

## 2018-11-03 NOTE — Consult Note (Signed)
Pharmacy Antibiotic Note  Carl [name] is a 58 y.o. male admitted on 11/03/2018 with sepsis.  Pharmacy has been consulted for aztreonam and vancomycin dosing. Patient has listed anaphylaxis allergy to PCN and pruritis with cefepime.   Plan: Vancomycin 1750 mg IV Q 24 hrs. Goal AUC 400-550. Expected AUC: 526.9 SCr used: 1.39  Start Aztreonam 1g every 8 hours   Height: 6' (182.9 cm) Weight: 180 lb (81.6 kg) IBW/kg (Calculated) : 77.6  Temp (24hrs), Avg:98.3 F (36.8 C), Min:98.3 F (36.8 C), Max:98.3 F (36.8 C)  Recent Labs  Lab 11/03/18 0151 11/03/18 0205  WBC  --  17.8*  CREATININE  --  1.39*  LATICACIDVEN 10.8*  --     Estimated Creatinine Clearance: 62.8 mL/min (A) (by C-G formula based on SCr of 1.39 mg/dL (H)).    Allergies  Allergen Reactions  . Iodinated Diagnostic Agents Shortness Of Breath  . Penicillins Anaphylaxis and Shortness Of Breath    Respiratory  Tolerated cefuroxime on 01/06/16  . Strawberry Extract Anaphylaxis  . Cefepime Itching    Empiric antibiotic, developed pruritis.   . Eggs Or Egg-Derived Products Itching    Medicine related, pt tolerates ingestion of eggs  . Erythromycin Itching  . Sulfa Antibiotics Itching, Nausea And Vomiting and Nausea Only    Antimicrobials this admission: 4/28 vancomycin >>  4/28 aztreonam  >>   Dose adjustments this admission:  Microbiology results: 4/28 BCx: pending  4/28 MRSA PCR: pending  4/28 SARS Coronavirus: negative   Thank you for allowing pharmacy to be a part of this patient's care.  Gardner Candle, PharmD, BCPS Clinical Pharmacist 11/03/2018 4:30 AM

## 2018-11-03 NOTE — Progress Notes (Signed)
PHARMACY CONSULT NOTE - FOLLOW UP  Pharmacy Consult for Electrolyte Monitoring and Replacement   Recent Labs: Potassium (mmol/L)  Date Value  11/03/2018 3.7   Calcium (mg/dL)  Date Value  16/04/9603 8.4 (L)   Albumin (g/dL)  Date Value  54/03/8118 3.8   Sodium (mmol/L)  Date Value  11/03/2018 137     Assessment: 57 yo male admitted with acute SOB.   Goal of Therapy:  Potassium ~4.0, Magnesium ~2.0  Plan:  Patient is receiving furosemide 40 mg IV q12h. Will order KCl 40 mEq PO x 1. Will order electrolytes with AM labs.  Pharmacy will continue to monitor.   Mauri Reading, PharmD Pharmacy Resident  11/03/2018 3:37 PM

## 2018-11-03 NOTE — Progress Notes (Signed)
CODE SEPSIS - PHARMACY COMMUNICATION  **Broad Spectrum Antibiotics should be administered within 1 hour of Sepsis diagnosis**  Time Code Sepsis Called/Page Received: @ 0237  Antibiotics Ordered: aztreonam, Flagyl, Vancomycin   Time of 1st antibiotic administration: @  0303  Additional action taken by pharmacy: Vancomycin dose adjusted to 20-25mg /kg loading dose. PCN allergy reviewed.   If necessary, Name of Provider/Nurse Contacted: N/A  Gardner Candle, PharmD, BCPS Clinical Pharmacist 11/03/2018 3:11 AM

## 2018-11-03 NOTE — ED Notes (Signed)
Coronavirus swab completed.

## 2018-11-03 NOTE — ED Notes (Signed)
EDP York Cerise notified in person of Lactic 10.8.

## 2018-11-03 NOTE — ED Notes (Signed)
Pt given warm blanket. Verbal okay to update daughter Jeanice Lim. Holly updated.

## 2018-11-03 NOTE — Progress Notes (Signed)
Pt taken off bipap tolerating well at this time. No shortness of breath at this time. Will continue to monitor.

## 2018-11-03 NOTE — Progress Notes (Signed)
*  PRELIMINARY RESULTS* Echocardiogram 2D Echocardiogram has been performed.  Joanette Gula Pranish Akhavan 11/03/2018, 3:20 PM

## 2018-11-03 NOTE — ED Provider Notes (Signed)
[facility] Emergency Department Provider Note  ____________________________________________   First MD Initiated Contact with Patient 11/03/18 0158     (approximate)  I have reviewed the triage vital signs and the nursing notes.   HISTORY  Chief Complaint Shortness of Breath  Level 5 caveat:  history/ROS limited by acute/critical illness  HPI Carl [name] is a 58 y.o. male with medical history as listed below who presents by EMS in severe respiratory distress.  There is almost no history; the patient apparently crawled into his daughter's bedroom and gasped for help during the night.  The patient is only able to speak in very short sentences (although he is trying to speak a lot).  He says that his shortness of breath came on acutely a few hours ago.  He also complains of chest tightness.  His symptoms are severe and any amount of exertion makes them worse and nothing makes them better.  The paramedics report that his room air oxygen saturation was 85% but on a nonrebreather he came up to 100% and he was out of percent when he arrived but in severe distress.  He says that he goes to [facility] for his medical care and he has had heart surgery in the past and has a pacer.         Past Medical History:  Diagnosis Date  . Acute pulmonary edema (HCC) 2017  . Acute respiratory failure with hypoxia (HCC) 2017  . Bipolar 1 disorder (HCC)   . COPD (chronic obstructive pulmonary disease) (HCC)   . Depression   . Hypertension   . Myocardial infarction (HCC)   . NSTEMI (non-ST elevated myocardial infarction) (HCC)   . 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 Seizures (HCC)    childhood  . Stroke (HCC)   . Tobacco abuse     Patient Active Problem List   Diagnosis Date Noted  . Acute respiratory failure (HCC) 11/03/2018    History reviewed. No pertinent surgical history.  Prior to Admission medications   Not on File    Allergies  Iodinated diagnostic agents; Penicillins; Strawberry extract; Cefepime; Eggs or egg-derived products; Erythromycin; and Sulfa antibiotics  History reviewed. No pertinent family history.  Social History Social History   Tobacco Use  . Smoking status: Current Every Day Smoker  . Smokeless tobacco: Never Used  Substance Use Topics  . Alcohol use: Not Currently  . Drug use: Never    Review of Systems Level 5 caveat:  history/ROS limited by acute/critical illness ____________________________________________   PHYSICAL EXAM:  VITAL SIGNS: ED Triage Vitals  Enc Vitals Group     BP 11/03/18 0200 (!) 158/99     Pulse Rate 11/03/18 0149 (!) 137     Resp 11/03/18 0148 (!) 51     Temp 11/03/18 0149 98.3 F (36.8 C)     Temp Source 11/03/18 0149 Oral     SpO2 11/03/18 0149 100 %     Weight 11/03/18 0154 81.6 kg (180 lb)     Height 11/03/18 0154 1.829 m (6')     Head Circumference --      Peak Flow --      Pain Score --      Pain Loc --      Pain Edu? --      Excl. in GC? --     Constitutional: Alert and oriented.  Severe respiratory distress nontoxic appearance. Eyes: Conjunctivae are normal.  Head: Atraumatic. Nose: No congestion/rhinnorhea.  Mouth/Throat: Mucous membranes are dry. Neck: No stridor.  No meningeal signs.   Cardiovascular: Rapid rate and variable rhythm, tachycardia in the 130s to 150s.Peri [place]. Good peripheral circulation. Grossly normal heart sounds. Respiratory: Greatly increased respiratory effort with intercostal muscle retractions and accessory muscle usage.  Tight breath sounds throughout with some expiratory wheezing, decreased air movement throughout all lung fields.  No coarse or wet breath sounds. Gastrointestinal: Soft and nontender. No distention.  Musculoskeletal: No lower extremity tenderness nor edema. No gross deformities of extremities. Neurologic:  No gross focal neurologic deficits are appreciated.  Skin:  Skin is pale, warm, dry and intact. No rash  noted.  ____________________________________________   LABS (all labs ordered are listed, but only abnormal results are displayed)  Labs Reviewed  BLOOD GAS, VENOUS - Abnormal; Notable for the following components:      Result Value   pH, Ven 6.91 (*)    pCO2, Ven 69 (*)    pO2, Ven 46.0 (*)    Bicarbonate 13.8 (*)    Acid-base deficit 19.7 (*)    All other components within normal limits  LACTIC ACID, PLASMA - Abnormal; Notable for the following components:   Lactic Acid, Venous 10.8 (*)    All other components within normal limits  CBC WITH DIFFERENTIAL/PLATELET - Abnormal; Notable for the following components:   WBC 17.8 (*)    RBC 4.12 (*)    Hemoglobin 12.3 (*)    RDW 16.0 (*)    nRBC 0.3 (*)    Neutro Abs 10.1 (*)    Lymphs Abs 5.5 (*)    Monocytes Absolute 1.7 (*)    Abs Immature Granulocytes 0.15 (*)    All other components within normal limits  COMPREHENSIVE METABOLIC PANEL - Abnormal; Notable for the following components:   CO2 8 (*)    Glucose, Bld 204 (*)    Creatinine, Ser 1.39 (*)    Alkaline Phosphatase 128 (*)    GFR calc non Af Amer 55 (*)    Anion gap 25 (*)    All other components within normal limits  FIBRIN DERIVATIVES D-DIMER ([facility] ONLY) - Abnormal; Notable for the following components:   Fibrin derivatives D-dimer [facility] 0,[phone],643.47 (*)    All other components within normal limits  LACTATE DEHYDROGENASE - Abnormal; Notable for the following components:   LDH 226 (*)    All other components within normal limits  TRIGLYCERIDES - Abnormal; Notable for the following components:   Triglycerides 220 (*)    All other components within normal limits  BRAIN NATRIURETIC PEPTIDE - Abnormal; Notable for the following components:   B Natriuretic Peptide >4,500.0 (*)    All other components within normal limits  TROPONIN I - Abnormal; Notable for the following components:   Troponin I 0.05 (*)    All other components within normal limits  SARS CORONAVIRUS 2  (HOSPITAL ORDER, PERFORMED IN [facility])  CULTURE, BLOOD (ROUTINE X 2)  CULTURE, BLOOD (ROUTINE X 2)  URINE CULTURE  EXPECTORATED SPUTUM ASSESSMENT W REFEX TO RESP CULTURE  CULTURE, BLOOD (ROUTINE X 2)  CULTURE, BLOOD (ROUTINE X 2)  PROCALCITONIN  FERRITIN  FIBRINOGEN  LIPASE, BLOOD  PROTIME-INR  LACTIC ACID, PLASMA  C-REACTIVE PROTEIN  URINALYSIS, COMPLETE (UACMP) WITH MICROSCOPIC  HIV ANTIBODY (ROUTINE TESTING W REFLEX)  BASIC METABOLIC PANEL  CBC  LACTIC ACID, PLASMA  LACTIC ACID, PLASMA   ____________________________________________  EKG  ED ECG REPORT I, Loleta Roseory Amaurie Wandel, the attending physician, personally viewed and interpreted  this ECG.  Date: 11/03/2018 EKG Time: 1:48 AM Rate: 145 Rhythm: A. fib with RVR QRS Axis: normal Intervals: Left bundle branch block ST/T Wave abnormalities: Non-specific ST segment / T-wave changes, but no clear evidence of acute ischemia. Narrative Interpretation: no definitive evidence of acute ischemia but is highly concerning for rate related to her demand ischemia.  No old EKGs are available in the computer against which to compare.  ____________________________________________  RADIOLOGY I, Loleta Rose, personally viewed and evaluated these images (plain radiographs) as part of my medical decision making, as well as reviewing the written report by the radiologist.  ED MD interpretation: Significant cardiomegaly with increased bilateral pulmonary interstitial opacities.  No obvious lobar pneumonia, interstitial edema versus atypical respiratory infection.  Official radiology report(s): Dg Chest Portable 1 View  Result Date: 11/03/2018 CLINICAL DATA:  58 year old male with increased shortness of breath, wheezing. EXAM: PORTABLE CHEST 1 VIEW COMPARISON:  None. FINDINGS: Portable AP upright view at 0205 hours. Cardiomegaly with left chest cardiac AICD. Prior median sternotomy and cardiac valve replacement. Other mediastinal  contours are within normal limits. Normal lung volumes with mildly increased bilateral pulmonary interstitial opacity, symmetric. No pneumothorax, pleural effusion or consolidation. Prior instrumentation of the left clavicle. Visualized tracheal air column is within normal limits. IMPRESSION: Cardiomegaly with increased bilateral pulmonary interstitial opacity. Consider acute interstitial edema versus viral/atypical respiratory infection. No pleural effusion identified. Electronically Signed   By: Odessa Fleming M.D.   On: 11/03/2018 02:54    ____________________________________________   PROCEDURES   Procedure(s) performed (including Critical Care):  .Critical Care Performed by: Loleta Rose, MD Authorized by: Loleta Rose, MD   Critical care provider statement:    Critical care time (minutes):  60   Critical care time was exclusive of:  Separately billable procedures and treating other patients   Critical care was necessary to treat or prevent imminent or life-threatening deterioration of the following conditions:  Respiratory failure and sepsis   Critical care was time spent personally by me on the following activities:  Development of treatment plan with patient or surrogate, discussions with consultants, evaluation of patient's response to treatment, examination of patient, obtaining history from patient or surrogate, ordering and performing treatments and interventions, ordering and review of laboratory studies, ordering and review of radiographic studies, pulse oximetry, re-evaluation of patient's condition and review of old charts     ____________________________________________   INITIAL IMPRESSION / MDM / ASSESSMENT AND PLAN / ED COURSE  As part of my medical decision making, I reviewed the following data within the electronic MEDICAL RECORD NUMBER Nursing notes reviewed and incorporated, Labs reviewed , EKG interpreted , Old chart reviewed, Radiograph reviewed , Discussed with admitting  physician  and Notes from prior ED visits      Carl [name] was evaluated in Emergency Department on 11/03/2018 for the symptoms described in the history of present illness. He was evaluated in the context of the global COVID-19 pandemic, which necessitated consideration that the patient might be at risk for infection with the SARS-CoV-2 virus that causes COVID-19. Institutional protocols and algorithms that pertain to the evaluation of patients at risk for COVID-19 are in a state of rapid change based on information released by regulatory bodies including the CDC and federal and state organizations. These policies and algorithms were followed during the patient's care in the ED.  Differential diagnosis includes, but is not limited to, COPD exacerbation, CHF exacerbation, ACS/MI, PE, COVID-19, pneumonia.  Minimal history is available and there are no  medical records on him currently.  He is in severe distress and after brief consideration about whether or not to intubate versus try BiPAP, I decided to start him on BiPAP with a viral filter given that this is most likely either a COPD or CHF exacerbation or both.  He has no high risk factors for COVID-19 than baseline as he has not been traveling and has not been in contact with anyone other than his family.  He denies any recent fever and states that the symptoms started acutely within the last couple of hours so that favor something like flash pulmonary edema although he does not sound wet or coarse.  I am sending a broad lab work and immediately we obtained a nasopharyngeal swab for COVID-19 testing.  Chest x-ray is pending.  EKG demonstrates left bundle branch block and A. fib with RVR but I have no old EKGs against which to compare.  His respiratory distress is the primary concern and I am also concerned that if I intubate him, it will be difficult to match his current respiratory rate which could lead to is significantly worse acidosis and  cardiopulmonary arrest.  He is protecting his airway at this time and on the BiPAP with duo nebs he may improve but we will move advanced airway equipment into the room and be prepared for intubation if needed.  He is in a negative pressure room.  Clinical Course as of Nov 03 403  Tue Nov 03, 2018  0224 Patient is tolerating the mask and his heart rate has improved but his respiratory rate is still in the 50s.  Venous blood gas is quite concerning with a pH of about 6.9 and a PCO2 of 69.  White blood cell count is 17.8 but it is unclear if this represents an infection or stress reaction to his respiratory failure.  Rest of his lab work is still pending.  I continue to be concerned about intubation given his acidosis and trying to match his respiratory rate on the ventilator will be very difficult.  I will hold off on any additional treatment at this time in favor of continued observation.  Chest x-ray was just performed I should see the image soon.   [CF]  [phone] The patient says he goes to [facility], but I cannot access his records through [facility]. I tried calling [facility] Records but was put on hold and then lost the phone connection.  Patient's identity was confirmed by his driver's license, and his address and DOB match.    [CF]  [phone] Given the patient's extreme lactic acid, which could simply be the result of respiratory failure, I am going to initiate code sepsis. I will give broad spectrum antibiotics until or unless we can prove this is not an infectious process, although this lactic acidosis would explain the severity of his VBG results. Awaiting CXR to determine if he is having volume overload, but he will need fluids.  Lactic Acid, Venous(!!): 10.8 [CF]  0252 [facility] medical records called back and we were able to get the 2 systems connected so I now have access to his history.   [CF]  0257 Probable atypical infection versus pulmonary edema, but I still feel that the patient needs the IV  fluids.  I am continuing a targeted 30 mL/kg total by giving 1 L at a time rather than bolusing 2 and half to 3 bag simultaneously.  Patient is continuing to tolerate BiPAP and his heart rate is come down  to 112.  DG Chest Portable 1 View [CF]  0315 Negative COVID-19 test which is reassuring  SARS Coronavirus 2: NEGATIVE [CF]  0324 Procalcitonin: <0.10 [CF]  0331 The patient's troponin slightly elevated 0.05 consistent with demand ischemia but unlikely to represent a myocardial infarction.  D-dimer is significantly elevated at 1600 but he has many reasons for the dimer to be elevated, not necessarily pulmonary embolism, which would also not explain his lactic acidosis or the fact that he improved so substantially on BiPAP.  Also confusing is the fact that his BNP is on measurably high greater than 4500, but clinically he is much better at this point after the BiPAP and almost 2 L of fluid.  His blood pressure is down to 119/82, respiratory rate is down to 20, and he is speaking in full sentences.  I have paged the hospitalist to discuss admission.   [CF]  [phone] Discussed the case with Dr. Arville Care with the hospitalist service.  He agreed with the plan for admission.   [CF]  [phone] Of note, the hospitalist and I agree that this patient does NOT appear consistent with sepsis, given the completely normal procalcitonin and the rest of the clinic picture.   [CF]    Clinical Course User Index [CF] Loleta Rose, MD     ____________________________________________  FINAL CLINICAL IMPRESSION(S) / ED DIAGNOSES  Final diagnoses:  Acute respiratory failure with hypoxia and hypercapnia (HCC)  Elevated troponin I level  Demand ischemia (HCC)  Elevated lactic acid level  COPD exacerbation (HCC)  Acute on chronic congestive heart failure, unspecified heart failure type (HCC)     MEDICATIONS GIVEN DURING THIS VISIT:  Medications  metroNIDAZOLE (FLAGYL) IVPB 500 mg (500 mg Intravenous New Bag/Given  11/03/18 0307)  vancomycin (VANCOCIN) 1,750 mg in sodium chloride 0.9 % 500 mL IVPB (1,750 mg Intravenous New Bag/Given 11/03/18 0324)  methylPREDNISolone sodium succinate (SOLU-MEDROL) 125 mg/2 mL injection 125 mg (has no administration in time range)  enoxaparin (LOVENOX) injection 40 mg (has no administration in time range)  sodium chloride flush (NS) 0.9 % injection 3 mL (has no administration in time range)  sodium chloride flush (NS) 0.9 % injection 3 mL (has no administration in time range)  0.9 %  sodium chloride infusion (has no administration in time range)  acetaminophen (TYLENOL) tablet 650 mg (has no administration in time range)    Or  acetaminophen (TYLENOL) suppository 650 mg (has no administration in time range)  traZODone (DESYREL) tablet 25 mg (has no administration in time range)  magnesium hydroxide (MILK OF MAGNESIA) suspension 30 mL (has no administration in time range)  ondansetron (ZOFRAN) tablet 4 mg (has no administration in time range)    Or  ondansetron (ZOFRAN) injection 4 mg (has no administration in time range)  aspirin EC tablet 81 mg (has no administration in time range)  methylPREDNISolone sodium succinate (SOLU-MEDROL) 40 mg/mL injection 40 mg (has no administration in time range)    Followed by  predniSONE (DELTASONE) tablet 40 mg (has no administration in time range)  furosemide (LASIX) injection 40 mg (has no administration in time range)  aztreonam (AZACTAM) 1 g in sodium chloride 0.9 % 100 mL IVPB (has no administration in time range)  ipratropium-albuterol (DUONEB) 0.5-2.5 (3) MG/3ML nebulizer solution 3 mL (has no administration in time range)  guaiFENesin (MUCINEX) 12 hr tablet 600 mg (has no administration in time range)  ipratropium-albuterol (DUONEB) 0.5-2.5 (3) MG/3ML nebulizer solution 3 mL (3 mLs Nebulization Given 11/03/18 0203)  ipratropium-albuterol (DUONEB) 0.5-2.5 (3) MG/3ML nebulizer solution 3 mL (3 mLs Nebulization Given 11/03/18 0203)   ipratropium-albuterol (DUONEB) 0.5-2.5 (3) MG/3ML nebulizer solution 3 mL (3 mLs Nebulization Given 11/03/18 0203)  aztreonam (AZACTAM) 2 g in sodium chloride 0.9 % 100 mL IVPB (0 g Intravenous Stopped 11/03/18 0344)  sodium chloride 0.9 % bolus 1,000 mL (1,000 mLs Intravenous New Bag/Given 11/03/18 0255)     ED Discharge Orders    None       Note:  This document was prepared using Dragon voice recognition software and may include unintentional dictation errors.   Loleta Rose, MD 11/03/18 [phone]

## 2018-11-03 NOTE — ED Notes (Signed)
2nd lav and 4 "gold/tiger tops" sent to lab.

## 2018-11-03 NOTE — Consult Note (Signed)
Name: Carl [name] MRN: [id number] DOB: 04-15-1961    ADMISSION DATE:  11/03/2018 CONSULTATION DATE: 11/03/2018  REFERRING MD : Dr. Sidney Ace   CHIEF COMPLAINT: Shortness of Breath   BRIEF PATIENT DESCRIPTION:  58 yo male admitted with acute hypoxic respiratory failure secondary to possible atypical respiratory infection vs. interstitial edema and AECOPD requiring Bipap. COVID-19 NEGATIVE    SIGNIFICANT EVENTS/STUDIES:  04/28-Pt admitted to the stepdown unit on Bipap   HISTORY OF PRESENT ILLNESS:   This is a 58 yo male with a PMH of Ischemic Left MCA Stroke, Hypothyroidism, Former ETOH and Drug Abuse, Tobacco Abuse, Stroke, Seizures, NSTEMI, MI, HTN, Depression, COPD, Prediabetes, Bipolar 1 Disorder, Pulmonary Edema, CAD, CABG with Bypass Grafting of OM1 and D1, Combined Systolic and Diastolic CHF (Echo 65/6812: EF 30-35%), PAD, and ICD.  He presented to [facility] ER via EMS on 04/28 with acute onset of shortness of breath and chest tightness. Per ER notes EMS reported pt remained hypoxic on NRB with O2 sats in the mid 80's.  In the ER pt noted to have audible wheezes with continued respiratory failure requiring Bipap.  COVID-19 negative, however CXR concerning for interstitial edema vs. atypical respiratory infection.  EKG revealed atrial fibrillation with rvr heart rate 145 bpm.  Lab results revealed BNP >4,500, lactic acid 10.8, triglycerides 220, CO2 8,  creatinine 1.39, anion gap 25, glucose 204, alk phos 128, LDH 226, troponin 0.05, wbc 17.8, hgb 12.3, pct <0.10, fibrin derivatives 1,643.47, and vbg 6.91, pCO2 69/acid base deficit 19/7/bicarb 13.8.  Therefore, code sepsis initiated pt received aztreonam, vancomycin, flagyl, and 1L NS bolus.  He also received duoneb x3 and 125 mg iv solumedrol.  He was subsequently admitted to the stepdown unit for additional workup and treatment.   PAST MEDICAL HISTORY :   has a past medical history of Acute pulmonary edema ([place]) (2017), Acute respiratory failure  with hypoxia ([place]) (2017), Bipolar 1 disorder ([place]), COPD (chronic obstructive pulmonary disease) ([place]), Depression, Hypertension, Myocardial infarction [facility]), NSTEMI (non-ST elevated myocardial infarction) [facility]), RVAD (right ventricular assist device) present ([place]) (01/06/2016), Seizures ([place]), Stroke ([place]), and Tobacco abuse.  has no past surgical history on file. Prior to Admission medications   Not on File   Allergies  Allergen Reactions   Iodinated Diagnostic Agents Shortness Of Breath   Penicillins Anaphylaxis and Shortness Of Breath    Respiratory  Tolerated cefuroxime on 01/06/16   Strawberry Extract Anaphylaxis   Cefepime Itching    Empiric antibiotic, developed pruritis.    Eggs Or Egg-Derived Products Itching    Medicine related, pt tolerates ingestion of eggs   Erythromycin Itching   Sulfa Antibiotics Itching, Nausea And Vomiting and Nausea Only    FAMILY HISTORY:  family history is not on file. SOCIAL HISTORY:  reports that he has been smoking. He has never used smokeless tobacco. He reports previous alcohol use. He reports that he does not use drugs.  REVIEW OF SYSTEMS:  Positives in BOLD  Constitutional: Negative for fever, chills, weight loss, malaise/fatigue and diaphoresis.  HENT: Negative for hearing loss, ear pain, nosebleeds, congestion, sore throat, neck pain, tinnitus and ear discharge.   Eyes: Negative for blurred vision, double vision, photophobia, pain, discharge and redness.  Respiratory: cough, hemoptysis, sputum production, shortness of breath, wheezing and stridor.   Cardiovascular: chest tightness, palpitations, orthopnea, claudication, leg swelling and PND.  Gastrointestinal: Negative for heartburn, nausea, vomiting, abdominal pain, diarrhea, constipation, blood in stool and melena.  Genitourinary: Negative for dysuria, urgency, frequency,  and flank pain.  °Musculoskeletal: Negative for myalgias, back pain, joint pain and falls.  °Skin:  Negative for itching and rash.  °Neurological: Negative for dizziness, tingling, tremors, sensory change, speech change, focal weakness, seizures, loss of consciousness, weakness and headaches.  °Endo/Heme/Allergies: Negative for environmental allergies and polydipsia. Does not bruise/bleed easily. ° °SUBJECTIVE:  °States breathing has improved on Bipap  ° °VITAL SIGNS: °Temp:  [98.3 °F (36.8 °C)] 98.3 °F (36.8 °C) (04/28 0149) °Pulse Rate:  [49-137] 98 (04/28 0302) °Resp:  [18-55] 27 (04/28 0330) °BP: (119-158)/(82-99) 121/94 (04/28 0330) °SpO2:  [99 %-100 %] 100 % (04/28 0319) °FiO2 (%):  [50 %] 50 % (04/28 0205) °Weight:  [81.6 kg] 81.6 kg (04/28 0154) ° °PHYSICAL EXAMINATION: °General: well developed, well nourished, NAD on Bipap  °Neuro: alert and oriented, follows commands  °HEENT: supple, no JVD  °Cardiovascular: irregular irregular, no R/G  °Lungs: faint crackles throughout, even, non labored °Abdomen: +BS x4, soft, non tender, reducible abdominal hernia, mildly distended   °Musculoskeletal: normal bulk and tone, no edema  °Skin: intact no rashes or lesions present  ° °Recent Labs  °Lab 11/03/18 °0205  °NA 139  °K 3.9  °CL 106  °CO2 8*  °BUN 18  °CREATININE 1.39*  °GLUCOSE 204*  ° °Recent Labs  °Lab 11/03/18 °0205  °HGB 12.3*  °HCT 40.9  °WBC 17.8*  °PLT 212  ° °Dg Chest Portable 1 View ° °Result Date: 11/03/2018 °CLINICAL DATA:  57-year-old male with increased shortness of breath, wheezing. EXAM: PORTABLE CHEST 1 VIEW COMPARISON:  None. FINDINGS: Portable AP upright view at 0205 hours. Cardiomegaly with left chest cardiac AICD. Prior median sternotomy and cardiac valve replacement. Other mediastinal contours are within normal limits. Normal lung volumes with mildly increased bilateral pulmonary interstitial opacity, symmetric. No pneumothorax, pleural effusion or consolidation. Prior instrumentation of the left clavicle. Visualized tracheal air column is within normal limits. IMPRESSION: Cardiomegaly with  increased bilateral pulmonary interstitial opacity. Consider acute interstitial edema versus viral/atypical respiratory infection. No pleural effusion identified. Electronically Signed   By: H  Hall M.D.   On: 11/03/2018 02:54  ° ° °ASSESSMENT / PLAN: ° °Acute hypoxic respiratory failure secondary to AECOPD and interstitial edema vs. atypical respiratory infection  °Hx: Tobacco Abuse  °Supplemental O2 for dyspnea and/or hypoxia °Prn and scheduled bronchodilator therapy  °IV and nebulized steroids  °Will check ABG  °IV lasix ° °Atrial fibrillation with rvr-improving °Elevated troponin likely demand ischemia in setting of acute respiratory failure  °Hx: HTN, CAD, NSTEMI, and RVAD °Continuous telemetry monitoring  °Trend troponin  °Echo pending  °Continue outpatient cardiac medications  ° °Acute renal failure  °Metabolic and lactic acidosis  °Trend BMP and lactic acid °Replace electrolytes as indicated  °Monitor UOP °Avoid nephrotoxic medications  ° °Leukocytosis in setting of possible atypical respiratory infection-COVID-19 NEGATIVE °Trend WBC and monitor fever curve  °Follow cultures  °Continue vancomycin and azactam, if MRSA PCR negative will discontinue vancomycin  ° °Anemia without obvious acute blood loss  °VTE px: subq lovenox  °Trend CBC  °Monitor for s/sx of bleeding and transfuse for hgb <7  ° °Hyperglycemia  °Hx: Hypothyroidism °CBG's ac/hs °SSI  °Hemoglobin A1c pending  °Continue synthroid  ° °Bipolar 1 disorder °Hx: Seizures and Depression  °Continue outpatient seroquel, fluoxetine, and depakote  ° ° , AGNP  °Pulmonary/Critical Care °Pager [phone] (please enter 7 digits) °PCCM Consult Pager [phone] (please enter 7 digits)  °

## 2018-11-03 NOTE — ED Notes (Signed)
RT x2 at bedside. Switched pt to BiPAP.

## 2018-11-03 NOTE — H&P (Addendum)
[facility] - [place] at [facility]   PATIENT NAME: Carl [name]    MR#:  [id number]  DATE OF BIRTH:  [birth year]  DATE OF ADMISSION:  11/03/2018  PRIMARY CARE PHYSICIAN: No primary care provider on file.   REQUESTING/REFERRING PHYSICIAN: Loleta RoseForbach, Cory, MD  CHIEF COMPLAINT:   Chief Complaint  Patient presents with   Shortness of Breath    HISTORY OF PRESENT ILLNESS:  Carl [name]  is a 58 y.o. male with a known history of multiple medical problems that will be mentioned below including COPD, CHF, coronary artery disease status post CABG and stents and pacemaker, who presented to the emergency room with acute onset of severe respiratory distress.  He has been having dry cough as well as dyspnea and wheezing yesterday and significantly got worse last night.  He admitted to associated orthopnea and paroxysmal nocturnal dyspnea without significant lower extremity edema.  He was de-satting to 85% on room air per EMS and it was up to 100 percent on nonrebreather.  He was still in respiratory distress upon arrival to the ER that he had to be placed on BiPAP.  Upon arrival to the ER he was significantly tachycardic with a pulse of 163 and tachypneic with a respiratory rate of 51, temperature 98.3 with a blood pressure 158/99 and pulse oximetry was 100% on 50% FiO2 on BiPAP.  Labs were remarkable for a creatinine of 1.39, chloride 106 and elevated BNP of more than 4500, LDH was 226 and troponin I 0.05 his lactic acid was 10.8 but procalcitonin less than 0.1.  CBC showed leukocytosis 17.8 and mild anemia with neutrophilia and absolute neutrophils of 10.1 and lymphocytes of 5.5.  Fibrinogen was 355 and fibrin derivatives 1643.47.  Blood glucose was 204.  The patient had a negative COVID-19 test.  Portable chest x-ray showed cardiomegaly, his pacemaker and interstitial pulmonary infiltrates concerning for pulmonary edema with differential diagnosis including atypical pneumonia.  2 blood  cultures were drawn.  EKG showed atrial flutter with rapid response of 145 with left bundle branch block.  The patient was given IV Azactam and vancomycin with Flagyl, duo nebs normal saline bolus and IV Solu-Medrol.  He will be admitted to a stepdown unit for further evaluation and management. PAST MEDICAL HISTORY:   Past Medical History:  Diagnosis Date   Acute pulmonary edema (HCC) 2017   Acute respiratory failure with hypoxia (HCC) 2017   Bipolar 1 disorder (HCC)    COPD (chronic obstructive pulmonary disease) (HCC)    Depression    Hypertension    Myocardial infarction [facility](HCC)    NSTEMI (non-ST elevated myocardial infarction) (HCC)    RVAD (right ventricular assist device) present (HCC) 01/06/2016   Seizures (HCC)    childhood   Stroke (HCC)    Tobacco abuse   -CVA -Ischemic cardiomyopathy with EF of 20% per the patient  PAST SURGICAL HISTORY:  History reviewed. No pertinent surgical history.  SOCIAL HISTORY:   Social History   Tobacco Use   Smoking status: Current Every Day Smoker   Smokeless tobacco: Never Used  Substance Use Topics   Alcohol use: Not Currently    FAMILY HISTORY:  History reviewed. No pertinent family history.  DRUG ALLERGIES:   Allergies  Allergen Reactions   Iodinated Diagnostic Agents Shortness Of Breath   Penicillins Anaphylaxis and Shortness Of Breath    Respiratory  Tolerated cefuroxime on 01/06/16   Strawberry Extract Anaphylaxis   Cefepime Itching    Empiric antibiotic, developed  pruritis.    Eggs Or Egg-Derived Products Itching    Medicine related, pt tolerates ingestion of eggs   Erythromycin Itching   Sulfa Antibiotics Itching, Nausea And Vomiting and Nausea Only    REVIEW OF SYSTEMS:   ROS As per history of present illness. All pertinent systems were reviewed above. Constitutional,  HEENT, cardiovascular, respiratory, GI, GU, musculoskeletal, neuro, psychiatric, endocrine,  integumentary and  hematologic systems were reviewed and are otherwise  negative/unremarkable except for positive findings mentioned above in the HPI.   MEDICATIONS AT HOME:   Prior to Admission medications   Not on File      VITAL SIGNS:  Blood pressure (!) 121/94, pulse 98, temperature 98.3 F (36.8 C), temperature source Oral, resp. rate (!) 27, height 6' (1.829 m), weight 81.6 kg, SpO2 100 %.  PHYSICAL EXAMINATION:  Physical Exam  GENERAL: Acutely ill 58 y.o.-year-old patient lying in the bed in moderat respiratory distress on BiPAP with conversational dyspnea.  EYES: Pupils equal, round, reactive to light and accommodation. No scleral icterus. Extraocular muscles intact.  HEENT: Head atraumatic, normocephalic. Oropharynx and nasopharynx clear.  NECK:  Supple, no jugular venous distention. No thyroid enlargement, no tenderness.  LUNGS: Bibasal crackles with diminished bibasilar breath sounds and rare mild expiratory wheezes and rhonchi. CARDIOVASCULAR: Regular rate and rhythm, S1, S2 normal. No murmurs, rubs, or gallops.  ABDOMEN: Soft, nondistended, nontender. Bowel sounds present. No organomegaly or mass.  EXTREMITIES: No pedal edema, cyanosis, or clubbing.  NEUROLOGIC: Cranial nerves II through XII are intact. Muscle strength 5/5 in all extremities. Sensation intact. Gait not checked.  PSYCHIATRIC: The patient is alert and oriented x 3.  Normal affect and good eye contact. SKIN: No obvious rash, lesion, or ulcer.   LABORATORY PANEL:   CBC Recent Labs  Lab 11/03/18 0205  WBC 17.8*  HGB 12.3*  HCT 40.9  PLT 212   ------------------------------------------------------------------------------------------------------------------  Chemistries  Recent Labs  Lab 11/03/18 0205  NA 139  K 3.9  CL 106  CO2 8*  GLUCOSE 204*  BUN 18  CREATININE 1.39*  CALCIUM 9.1  AST 40  ALT 27  ALKPHOS 128*  BILITOT 1.2    ------------------------------------------------------------------------------------------------------------------  Cardiac Enzymes Recent Labs  Lab 11/03/18 0205  TROPONINI 0.05*   ------------------------------------------------------------------------------------------------------------------  RADIOLOGY:  Dg Chest Portable 1 View  Result Date: 11/03/2018 CLINICAL DATA:  58 year old male with increased shortness of breath, wheezing. EXAM: PORTABLE CHEST 1 VIEW COMPARISON:  None. FINDINGS: Portable AP upright view at 0205 hours. Cardiomegaly with left chest cardiac AICD. Prior median sternotomy and cardiac valve replacement. Other mediastinal contours are within normal limits. Normal lung volumes with mildly increased bilateral pulmonary interstitial opacity, symmetric. No pneumothorax, pleural effusion or consolidation. Prior instrumentation of the left clavicle. Visualized tracheal air column is within normal limits. IMPRESSION: Cardiomegaly with increased bilateral pulmonary interstitial opacity. Consider acute interstitial edema versus viral/atypical respiratory infection. No pleural effusion identified. Electronically Signed   By: Odessa Fleming M.D.   On: 11/03/2018 02:54      IMPRESSION AND PLAN:   #1.  Acute respiratory failure likely secondary to severe COPD acute exacerbation likely secondary to acute bronchitis, in addition to likely acute cor pulmonale with acute on chronic CHF likely systolic given history of ischemic cardiomyopathy per available out of this hospital records.  The patient will be admitted to a stepdown unit.  We will continue him on scheduled and PRN duo nebs as well as antibiotic therapy with IV Azactam and vancomycin pending blood cultures  results.  Will provide mucolytics.  The patient will be diuresed with IV Lasix and we will obtain a 2D echo in a.m an serial troponin I.  I doubt pneumonia in his case and severe sepsis despite significantly elevated lactic acid  level given his normal procalcitonin.  His lactic acid level elevation could be from acute CHF and acute respiratory failure.  The patient was COVID-19 negative.  Will obtain ABG.  I notified the intensivist regarding this admission.  We also obtained a sputum culture and sensitivity and will follow his blood cultures.  Respiratory PCR panel was ordered.  2.  Coronary artery disease status post CABG status post PCI and stents.  Will continue aspirin, beta-blocker therapy as well as statin therapy once medications are reconciled.  3.  Hypothyroidism.  His Synthroid to be continued and TSH will be checked.  4.  Hypertension.  We will continue his lisinopril and Coreg.  5.  Depression and bipolar disorder.  We will continue Prozac and Depakote.  6.  History of CVA and seizures.  Aspirin and Depakote XR will be continued.  7.  Dyslipidemia.  Statin therapy will be resumed  8.  DVT prophylaxis.  This will be provided subtenons Lovenox.  GI prophylaxis will be provided with p.o. Pepcid given current steroid therapy.   All the records are reviewed and case discussed with ED provider. The plan of care was discussed in details with the patient (and family). I answered all questions. The patient agreed to proceed with the above mentioned plan. Further management will depend upon hospital course.   CODE STATUS: Full code  TOTAL CRITICAL CARE TIME TAKING CARE OF THIS PATIENT: 60 minutes.    Hannah Beat M.D on 11/03/2018 at 4:05 AM  Pager - [phone]  After 6pm go to [email or web address] - Scientist, research (life sciences) [facility]  Office  [phone]  CC: Primary care physician; No primary care provider on file.   Note: This dictation was prepared with Dragon dictation along with smaller phrase technology. Any transcriptional errors that result from this process are unintentional.

## 2018-11-03 NOTE — ED Triage Notes (Signed)
Pt in via EMS from home d/t SOB. EMS states pt crawled out to wife who called EMS. Pt sweating and has chest tightness. On nonrebreather with EMS as was satting mid 80s. Heavy wheezing. Aslo c/o abd pain.

## 2018-11-03 NOTE — Progress Notes (Signed)
Pt transported to ICU 18 on Bipap without incident. Pt remains on Bipap and is tol well at this time. Report given to ICU RT, ICU RT aware of ABG order.

## 2018-11-03 NOTE — Care Management (Signed)
This RNCM has attempted to see patient for assessment and CM consult for COPD.  Patient was working with PT and is now having ECHO performed.  Will attempt to assess at a later time.

## 2018-11-03 NOTE — ED Notes (Signed)
ED TO INPATIENT HANDOFF REPORT  ED Nurse Name and Phone #: Erie Noe [phone]  S Name/Age/Gender Carl [name] 58 y.o. male Room/Bed: ED11A/ED11A  Code Status   Code Status: Full Code  Home/SNF/Other Home Patient oriented to: self, place, time and situation Is this baseline? Yes   Triage Complete: Triage complete  Chief Complaint Breathing Difficulty  Triage Note Pt in via EMS from home d/t SOB. EMS states pt crawled out to wife who called EMS. Pt sweating and has chest tightness. On nonrebreather with EMS as was satting mid 80s. Heavy wheezing. Aslo c/o abd pain.    Allergies Allergies  Allergen Reactions  . Iodinated Diagnostic Agents Shortness Of Breath  . Penicillins Anaphylaxis and Shortness Of Breath    Respiratory  Tolerated cefuroxime on 01/06/16  . Strawberry Extract Anaphylaxis  . Cefepime Itching    Empiric antibiotic, developed pruritis.   . Eggs Or Egg-Derived Products Itching    Medicine related, pt tolerates ingestion of eggs  . Erythromycin Itching  . Sulfa Antibiotics Itching, Nausea And Vomiting and Nausea Only    Level of Care/Admitting Diagnosis ED Disposition    ED Disposition Condition Comment   Admit  Hospital Area: [facility] [[id number]]  Level of Care: Stepdown [14]  Covid Evaluation: N/A  Diagnosis: Acute respiratory failure (HCC) [518.81.ICD-9-CM]  Admitting Physician: Hannah Beat [[id number]]  Attending Physician: Hannah Beat [[id number]]  Estimated length of stay: past midnight tomorrow  Certification:: I certify this patient will need inpatient services for at least 2 midnights  PT Class (Do Not Modify): Inpatient [101]  PT Acc Code (Do Not Modify): Private [1]       B Medical/Surgery History Past Medical History:  Diagnosis Date  . Acute pulmonary edema (HCC) 2017  . Acute respiratory failure with hypoxia (HCC) 2017  . Bipolar 1 disorder (HCC)   . COPD (chronic obstructive pulmonary disease) (HCC)   . Depression    . Hypertension   . Myocardial infarction (HCC)   . NSTEMI (non-ST elevated myocardial infarction) (HCC)   . 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 Seizures (HCC)    childhood  . Stroke (HCC)   . Tobacco abuse    History reviewed. No pertinent surgical history.   A IV Location/Drains/Wounds Patient Lines/Drains/Airways Status   Active Line/Drains/Airways    Name:   Placement date:   Placement time:   Site:   Days:   Peripheral IV 11/03/18 Right Wrist   11/03/18    0145    Wrist   less than 1   Peripheral IV 11/03/18 Left Antecubital   11/03/18    0155    Antecubital   less than 1   Peripheral IV 11/03/18 Left Wrist   11/03/18    0321    Wrist   less than 1          Intake/Output Last 24 hours No intake or output data in the 24 hours ending 11/03/18 0432  Labs/Imaging Results for orders placed or performed during the hospital encounter of 11/03/18 (from the past 48 hour(s))  SARS Coronavirus 2 [facility] order, Performed in [facility] hospital lab)     Status: None   Collection Time: 11/03/18  1:51 AM  Result Value Ref Range   SARS Coronavirus 2 NEGATIVE NEGATIVE    Comment: (NOTE) If result is NEGATIVE SARS-CoV-2 target nucleic acids are NOT DETECTED. The SARS-CoV-2 RNA is generally detectable in upper and lower  respiratory specimens during the  acute phase of infection. The lowest  concentration of SARS-CoV-2 viral copies this assay can detect is 250  copies / mL. A negative result does not preclude SARS-CoV-2 infection  and should not be used as the sole basis for treatment or other  patient management decisions.  A negative result may occur with  improper specimen collection / handling, submission of specimen other  than nasopharyngeal swab, presence of viral mutation(s) within the  areas targeted by this assay, and inadequate number of viral copies  (<250 copies / mL). A negative result must be combined with clinical  observations,  patient history, and epidemiological information. If result is POSITIVE SARS-CoV-2 target nucleic acids are DETECTED. The SARS-CoV-2 RNA is generally detectable in upper and lower  respiratory specimens dur ing the acute phase of infection.  Positive  results are indicative of active infection with SARS-CoV-2.  Clinical  correlation with patient history and other diagnostic information is  necessary to determine patient infection status.  Positive results do  not rule out bacterial infection or co-infection with other viruses. If result is PRESUMPTIVE POSTIVE SARS-CoV-2 nucleic acids MAY BE PRESENT.   A presumptive positive result was obtained on the submitted specimen  and confirmed on repeat testing.  While 2019 novel coronavirus  (SARS-CoV-2) nucleic acids may be present in the submitted sample  additional confirmatory testing may be necessary for epidemiological  and / or clinical management purposes  to differentiate between  SARS-CoV-2 and other Sarbecovirus currently known to infect humans.  If clinically indicated additional testing with an alternate test  methodology [phone]) is advised. The SARS-CoV-2 RNA is generally  detectable in upper and lower respiratory sp ecimens during the acute  phase of infection. The expected result is Negative. Fact Sheet for Patients:  BoilerBrush.com.cy Fact Sheet for Healthcare Providers: https://pope.com/ This test is not yet approved or cleared by the [place] FDA and has been authorized for detection and/or diagnosis of SARS-CoV-2 by FDA under an Emergency Use Authorization (EUA).  This EUA will remain in effect (meaning this test can be used) for the duration of the COVID-19 declaration under Section 564(b)(1) of the Act, 21 U.S.C. section 360bbb-3(b)(1), unless the authorization is terminated or revoked sooner. Performed at [facility], [address]  [place], [place] [address]   Lactic acid, plasma     Status: Abnormal   Collection Time: 11/03/18  1:51 AM  Result Value Ref Range   Lactic Acid, Venous 10.8 (HH) 0.5 - 1.9 mmol/L    Comment: CRITICAL RESULT CALLED TO, READ BACK BY AND VERIFIED WITH GEORGIE HAHLE AT 0234 11/03/2018 SMA Performed at [facility], [address] [place], [place] [address]   Triglycerides     Status: Abnormal   Collection Time: 11/03/18  1:51 AM  Result Value Ref Range   Triglycerides 220 (H) <150 mg/dL    Comment: Performed at [facility], [address] [place], [place] [address]  Brain natriuretic peptide     Status: Abnormal   Collection Time: 11/03/18  1:51 AM  Result Value Ref Range   B Natriuretic Peptide >4,500.0 (H) 0.0 - 100.0 pg/mL    Comment: Performed at [facility], [address] [place], [place] [address]  Blood gas, venous     Status: Abnormal   Collection Time: 11/03/18  2:05 AM  Result Value Ref Range   FIO2 0.70    Delivery systems BILEVEL POSITIVE AIRWAY PRESSURE    LHR 10 resp/min   pH,  Ven 6.91 (LL) 7.250 - 7.430    Comment: CRITICAL RESULT CALLED TO, READ BACK BY AND VERIFIED WITH: FORBACH, MD AT 0211 ON 11/03/2018    pCO2, Ven 69 (H) 44.0 - 60.0 mmHg   pO2, Ven 46.0 (H) 32.0 - 45.0 mmHg   Bicarbonate 13.8 (L) 20.0 - 28.0 mmol/L   Acid-base deficit 19.7 (H) 0.0 - 2.0 mmol/L   O2 Saturation 48.2 %   Patient temperature 37.0    Collection site VEIN    Sample type VENOUS     Comment: Performed at [facility], [address] [place], [place] [address]  CBC WITH DIFFERENTIAL     Status: Abnormal   Collection Time: 11/03/18  2:05 AM  Result Value Ref Range   WBC 17.8 (H) 4.0 - 10.5 K/uL   RBC 4.12 (L) 4.22 - 5.81 MIL/uL   Hemoglobin 12.3 (L) 13.0 - 17.0 g/dL   HCT 78.440.9 69.639.0 - 29.552.0 %   MCV 99.3 80.0 - 100.0 fL   MCH 29.9 26.0 - 34.0 pg   MCHC 30.1 30.0 - 36.0 g/dL   RDW 28.416.0 (H) 13.211.5 - 44.015.5 %   Platelets 212 150 - 400 K/uL    nRBC 0.3 (H) 0.0 - 0.2 %   Neutrophils Relative % 55 %   Neutro Abs 10.1 (H) 1.7 - 7.7 K/uL   Lymphocytes Relative 31 %   Lymphs Abs 5.5 (H) 0.7 - 4.0 K/uL   Monocytes Relative 10 %   Monocytes Absolute 1.7 (H) 0.1 - 1.0 K/uL   Eosinophils Relative 2 %   Eosinophils Absolute 0.4 0.0 - 0.5 K/uL   Basophils Relative 1 %   Basophils Absolute 0.1 0.0 - 0.1 K/uL   Immature Granulocytes 1 %   Abs Immature Granulocytes 0.15 (H) 0.00 - 0.07 K/uL    Comment: Performed at [facility], [address] [place], [place] [address]  Comprehensive metabolic panel     Status: Abnormal   Collection Time: 11/03/18  2:05 AM  Result Value Ref Range   Sodium 139 135 - 145 mmol/L   Potassium 3.9 3.5 - 5.1 mmol/L   Chloride 106 98 - 111 mmol/L    Comment: LYTES REPEATED SMA   CO2 8 (L) 22 - 32 mmol/L   Glucose, Bld 204 (H) 70 - 99 mg/dL   BUN 18 6 - 20 mg/dL   Creatinine, Ser 5.361.39 (H) 0.61 - 1.24 mg/dL   Calcium 9.1 8.9 - 64.410.3 mg/dL   Total Protein 7.6 6.5 - 8.1 g/dL   Albumin 3.8 3.5 - 5.0 g/dL   AST 40 15 - 41 U/L   ALT 27 0 - 44 U/L   Alkaline Phosphatase 128 (H) 38 - 126 U/L   Total Bilirubin 1.2 0.3 - 1.2 mg/dL   GFR calc non Af Amer 55 (L) >60 mL/min   GFR calc Af Amer >60 >60 mL/min   Anion gap 25 (H) 5 - 15    Comment: Performed at [facility], [address] [place], [place] [address]  Fibrin derivatives D-Dimer     Status: Abnormal   Collection Time: 11/03/18  2:05 AM  Result Value Ref Range   Fibrin derivatives D-dimer ([facility]) 1,643.47 (H) 0.00 - 499.00 ng/mL (FEU)    Comment: (NOTE) <> Exclusion of Venous Thromboembolism (VTE) - OUTPATIENT ONLY   (Emergency Department or [place])   0-499 ng/ml (FEU): With a low to intermediate pretest probability  for VTE this test result excludes the diagnosis                      of VTE.   >499 ng/ml (FEU) : VTE not excluded; additional work up for VTE is                      required. <> Testing on  Inpatients and Evaluation of Disseminated Intravascular   Coagulation (DIC) Reference Range:   0-499 ng/ml (FEU) Performed at [facility], [address] [place], [place] [address]   Procalcitonin     Status: None   Collection Time: 11/03/18  2:05 AM  Result Value Ref Range   Procalcitonin <0.10 ng/mL    Comment:        Interpretation: PCT (Procalcitonin) <= 0.5 ng/mL: Systemic infection (sepsis) is not likely. Local bacterial infection is possible. (NOTE)       Sepsis PCT Algorithm           Lower Respiratory Tract                                      Infection PCT Algorithm    ----------------------------     ----------------------------         PCT < 0.25 ng/mL                PCT < 0.10 ng/mL         Strongly encourage             Strongly discourage   discontinuation of antibiotics    initiation of antibiotics    ----------------------------     -----------------------------       PCT 0.25 - 0.50 ng/mL            PCT 0.10 - 0.25 ng/mL               OR       >80% decrease in PCT            Discourage initiation of                                            antibiotics      Encourage discontinuation           of antibiotics    ----------------------------     -----------------------------         PCT >= 0.50 ng/mL              PCT 0.26 - 0.50 ng/mL               AND        <80% decrease in PCT             Encourage initiation of                                             antibiotics       Encourage continuation           of antibiotics    ----------------------------     -----------------------------        PCT >= 0.50 ng/mL  PCT > 0.50 ng/mL               AND         increase in PCT                  Strongly encourage                                      initiation of antibiotics    Strongly encourage escalation           of antibiotics                                     -----------------------------                                            PCT <= 0.25 ng/mL                                                 OR                                        > 80% decrease in PCT                                     Discontinue / Do not initiate                                             antibiotics Performed at [facility], [address] [place], [place] [address]   Lactate dehydrogenase     Status: Abnormal   Collection Time: 11/03/18  2:05 AM  Result Value Ref Range   LDH 226 (H) 98 - 192 U/L    Comment: Performed at [facility], [address] [place], [place] [address]  Ferritin     Status: None   Collection Time: 11/03/18  2:05 AM  Result Value Ref Range   Ferritin 201 24 - 336 ng/mL    Comment: Performed at [facility], [address] [place], [place] [address]  Fibrinogen     Status: None   Collection Time: 11/03/18  2:05 AM  Result Value Ref Range   Fibrinogen 355 210 - 475 mg/dL    Comment: Performed at [facility], [address] [place], [place] [address]  Troponin I - Once     Status: Abnormal   Collection Time: 11/03/18  2:05 AM  Result Value Ref Range   Troponin I 0.05 (HH) <0.03 ng/mL    Comment: CRITICAL RESULT CALLED TO, READ BACK BY AND VERIFIED WITH Craigmont BRYANT @ 6578 11/03/18 AKT Performed at [facility], [address] [place], [place] [address]   Lipase, blood     Status: None   Collection Time: 11/03/18  2:05 AM  Result Value Ref Range   Lipase 38 11 - 51 U/L    Comment: Performed at [facility], [address] [place], [place] [address]  Protime-INR     Status: None   Collection Time: 11/03/18  2:37 AM  Result Value Ref Range   Prothrombin Time 14.1 11.4 - 15.2 seconds   INR 1.1 0.8 - 1.2    Comment: (NOTE) INR goal varies based on device and disease states. Performed at [facility], [address] [place], [place] [address]    Dg Chest Portable 1 View  Result Date: 11/03/2018 CLINICAL DATA:  58 year old  male with increased shortness of breath, wheezing. EXAM: PORTABLE CHEST 1 VIEW COMPARISON:  None. FINDINGS: Portable AP upright view at 0205 hours. Cardiomegaly with left chest cardiac AICD. Prior median sternotomy and cardiac valve replacement. Other mediastinal contours are within normal limits. Normal lung volumes with mildly increased bilateral pulmonary interstitial opacity, symmetric. No pneumothorax, pleural effusion or consolidation. Prior instrumentation of the left clavicle. Visualized tracheal air column is within normal limits. IMPRESSION: Cardiomegaly with increased bilateral pulmonary interstitial opacity. Consider acute interstitial edema versus viral/atypical respiratory infection. No pleural effusion identified. Electronically Signed   By: Odessa Fleming M.D.   On: 11/03/2018 02:54    Pending Labs Unresulted Labs (From admission, onward)    Start     Ordered   11/03/18 0800  Troponin I - Now Then Q6H  Now then every 6 hours,   STAT     11/03/18 0430   11/03/18 0500  Basic metabolic panel  Tomorrow morning,   STAT     11/03/18 0352   11/03/18 0500  CBC  Tomorrow morning,   STAT     11/03/18 0352   11/03/18 0430  Respiratory Panel by PCR  (Respiratory virus panel with precautions)  Once,   STAT     11/03/18 0429   11/03/18 0430  Blood gas, arterial  Once,   STAT     11/03/18 0429   11/03/18 0420  MRSA PCR Screening  Once,   STAT     11/03/18 0419   11/03/18 0403  Lactic acid, plasma  STAT Now then every 3 hours,   STAT     11/03/18 0402   11/03/18 0356  Culture, sputum-assessment  Once,   STAT     11/03/18 0400   11/03/18 0356  Culture, blood (routine x 2) Call MD if unable to obtain prior to antibiotics being given  BLOOD CULTURE X 2,   STAT    Comments:  If blood cultures drawn in Emergency Department - Do not draw and cancel order    11/03/18 0400   11/03/18 0343  HIV antibody (Routine Testing)  Once,   STAT     11/03/18 0352   11/03/18 0236  Urinalysis, Complete w Microscopic   ONCE - STAT,   STAT     11/03/18 0238   11/03/18 0236  Urine culture  Add-on,   AD     11/03/18 0238   11/03/18 0200  Lactic acid, plasma  Now then every 2 hours,   STAT     11/03/18 0159   11/03/18 0200  Blood Culture (routine x 2)  BLOOD CULTURE X 2,   STAT     11/03/18 0159   11/03/18 0200  C-reactive protein  Once,   STAT     11/03/18 0159          Vitals/Pain Today's Vitals  11/03/18 0319 11/03/18 0319 11/03/18 0324 11/03/18 0330  BP:  119/82  (!) 121/94  Pulse:      Resp: (!) 31  (!) 22 (!) 27  Temp:      TempSrc:      SpO2: 100%     Weight:      Height:        Isolation Precautions Droplet precaution  Medications Medications  vancomycin (VANCOCIN) 1,750 mg in sodium chloride 0.9 % 500 mL IVPB (1,750 mg Intravenous New Bag/Given 11/03/18 0324)  enoxaparin (LOVENOX) injection 40 mg (has no administration in time range)  sodium chloride flush (NS) 0.9 % injection 3 mL (has no administration in time range)  sodium chloride flush (NS) 0.9 % injection 3 mL (has no administration in time range)  0.9 %  sodium chloride infusion (has no administration in time range)  acetaminophen (TYLENOL) tablet 650 mg (has no administration in time range)    Or  acetaminophen (TYLENOL) suppository 650 mg (has no administration in time range)  traZODone (DESYREL) tablet 25 mg (has no administration in time range)  magnesium hydroxide (MILK OF MAGNESIA) suspension 30 mL (has no administration in time range)  ondansetron (ZOFRAN) tablet 4 mg (has no administration in time range)    Or  ondansetron (ZOFRAN) injection 4 mg (has no administration in time range)  aspirin EC tablet 81 mg (has no administration in time range)  methylPREDNISolone sodium succinate (SOLU-MEDROL) 40 mg/mL injection 40 mg (has no administration in time range)    Followed by  predniSONE (DELTASONE) tablet 40 mg (has no administration in time range)  furosemide (LASIX) injection 40 mg (has no administration in time  range)  aztreonam (AZACTAM) 1 g in sodium chloride 0.9 % 100 mL IVPB (has no administration in time range)  ipratropium-albuterol (DUONEB) 0.5-2.5 (3) MG/3ML nebulizer solution 3 mL (has no administration in time range)  guaiFENesin (MUCINEX) 12 hr tablet 600 mg (has no administration in time range)  vancomycin (VANCOCIN) 1,750 mg in sodium chloride 0.9 % 500 mL IVPB (has no administration in time range)  ipratropium-albuterol (DUONEB) 0.5-2.5 (3) MG/3ML nebulizer solution 3 mL (3 mLs Nebulization Given 11/03/18 0203)  ipratropium-albuterol (DUONEB) 0.5-2.5 (3) MG/3ML nebulizer solution 3 mL (3 mLs Nebulization Given 11/03/18 0203)  ipratropium-albuterol (DUONEB) 0.5-2.5 (3) MG/3ML nebulizer solution 3 mL (3 mLs Nebulization Given 11/03/18 0203)  aztreonam (AZACTAM) 2 g in sodium chloride 0.9 % 100 mL IVPB (0 g Intravenous Stopped 11/03/18 0344)  metroNIDAZOLE (FLAGYL) IVPB 500 mg (0 mg Intravenous Stopped 11/03/18 0409)  sodium chloride 0.9 % bolus 1,000 mL (0 mLs Intravenous Stopped 11/03/18 0409)  methylPREDNISolone sodium succinate (SOLU-MEDROL) 125 mg/2 mL injection 125 mg (125 mg Intravenous Given 11/03/18 0410)    Mobility walks Moderate fall risk   Focused Assessments Cardiac Assessment Handoff:  Cardiac Rhythm: Atrial fibrillation Lab Results  Component Value Date   TROPONINI 0.05 (HH) 11/03/2018   No results found for: DDIMER Does the Patient currently have chest pain? No     R Recommendations: See Admitting Provider Note  Report given to:   Additional Notes:

## 2018-11-03 NOTE — ED Notes (Signed)
EKG completed

## 2018-11-03 NOTE — Progress Notes (Signed)
PT Cancellation Note  Patient Details Name: Carl [name] MRN: [id number] DOB: [birth year]   Cancelled Treatment:    Reason Eval/Treat Not Completed: Patient not medically ready Consult received, chart reviewed. Pt noted to have been taken off BiPAP this am. Will hold Physical Therapy evaluation this morning to ensure pt tolerates being off BiPAP. Will continue to follow/re-attempt at later time/date when medically appropriate.    Precious Bard, PT, DPT   11/03/2018, 10:52 AM

## 2018-11-04 LAB — BASIC METABOLIC PANEL
Anion gap: 9 (ref 5–15)
BUN: 32 mg/dL — ABNORMAL HIGH (ref 6–20)
CO2: 21 mmol/L — ABNORMAL LOW (ref 22–32)
Calcium: 9.2 mg/dL (ref 8.9–10.3)
Chloride: 110 mmol/L (ref 98–111)
Creatinine, Ser: 1.22 mg/dL (ref 0.61–1.24)
GFR calc Af Amer: >60 mL/min (ref >60)
GFR calc non Af Amer: >60 mL/min (ref >60)
Glucose, Bld: 128 mg/dL — ABNORMAL HIGH (ref 70–99)
Potassium: 4.7 mmol/L (ref 3.5–5.1)
Sodium: 140 mmol/L (ref 135–145)

## 2018-11-04 LAB — CBC
HCT: 29.8 % — ABNORMAL LOW (ref 39.0–52.0)
Hemoglobin: 9.7 g/dL — ABNORMAL LOW (ref 13.0–17.0)
MCH: 29.9 pg (ref 26.0–34.0)
MCHC: 32.6 g/dL (ref 30.0–36.0)
MCV: 92 fL (ref 80.0–100.0)
Platelets: 209 10*3/uL (ref 150–400)
RBC: 3.24 MIL/uL — ABNORMAL LOW (ref 4.22–5.81)
RDW: 16.2 % — ABNORMAL HIGH (ref 11.5–15.5)
WBC: 12.3 10*3/uL — ABNORMAL HIGH (ref 4.0–10.5)
nRBC: 0 % (ref 0.0–0.2)

## 2018-11-04 LAB — URINE CULTURE: Culture: NO GROWTH

## 2018-11-04 LAB — HEMOGLOBIN A1C
Hgb A1c MFr Bld: 5.6 % (ref 4.8–5.6)
Mean Plasma Glucose: 114.02 mg/dL

## 2018-11-04 LAB — ECHOCARDIOGRAM COMPLETE
Height: 69 in
Weight: 2955.93 oz

## 2018-11-04 LAB — HIV ANTIBODY (ROUTINE TESTING W REFLEX): HIV Screen 4th Generation wRfx: NONREACTIVE

## 2018-11-04 LAB — LEGIONELLA PNEUMOPHILA SEROGP 1 UR AG: L. pneumophila Serogp 1 Ur Ag: NEGATIVE

## 2018-11-04 MED ORDER — ENOXAPARIN SODIUM 40 MG/0.4ML ~~LOC~~ SOLN: 40.0000 mg | SUBCUTANEOUS | Status: DC

## 2018-11-04 MED ORDER — FUROSEMIDE 40 MG PO TABS: 40.0000 mg | ORAL_TABLET | Freq: Two times a day (BID) | ORAL | 0 refills | Status: DC

## 2018-11-04 MED ORDER — PREDNISONE 50 MG PO TABS: ORAL_TABLET | ORAL | 0 refills | Status: AC

## 2018-11-04 MED ORDER — FUROSEMIDE 10 MG/ML IJ SOLN: 40.0000 mg | Freq: Two times a day (BID) | INTRAMUSCULAR | Status: DC

## 2018-11-04 MED ORDER — FAMOTIDINE 20 MG PO TABS: 20.0000 mg | ORAL_TABLET | Freq: Two times a day (BID) | ORAL | Status: DC

## 2018-11-04 MED ORDER — ENSURE ENLIVE PO LIQD
237.0000 mL | Freq: Two times a day (BID) | ORAL | Status: DC
  Administered 2018-11-04 (×2): 237 mL via ORAL

## 2018-11-04 NOTE — Progress Notes (Signed)
CRITICAL CARE NOTE        SUBJECTIVE FINDINGS & SIGNIFICANT EVENTS    Prognosis is guarded, discussed with Dr Juliene Pina - will try to d/c to SNF - working with case management today.  Resting in bed comfortably, no new complaints.  Overnight events noted.   PAST MEDICAL HISTORY   Past Medical History:  Diagnosis Date  . Acute pulmonary edema (HCC) 2017  . Acute respiratory failure with hypoxia (HCC) 2017  . Bipolar 1 disorder (HCC)   . COPD (chronic obstructive pulmonary disease) (HCC)   . Depression   . Hypertension   . Myocardial infarction (HCC)   . NSTEMI (non-ST elevated myocardial infarction) (HCC)   . 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 Seizures (HCC)    childhood  . Stroke (HCC)   . Tobacco abuse      SURGICAL HISTORY   History reviewed. No pertinent surgical history.   FAMILY HISTORY   History reviewed. No pertinent family history.   SOCIAL HISTORY   Social History   Tobacco Use  . Smoking status: Current Every Day Smoker  . Smokeless tobacco: Never Used  Substance Use Topics  . Alcohol use: Not Currently  . Drug use: Never     MEDICATIONS   Current Medication:  Current Facility-Administered Medications:  .  0.9 %  sodium chloride infusion, 250 mL, Intravenous, PRN, Mansy, Jan A, MD .  acetaminophen (TYLENOL) tablet 650 mg, 650 mg, Oral, Q6H PRN, 650 mg at 11/03/18 2008 **OR** acetaminophen (TYLENOL) suppository 650 mg, 650 mg, Rectal, Q6H PRN, Mansy, Jan A, MD .  aspirin EC tablet 81 mg, 81 mg, Oral, Daily, Mansy, Jan A, MD, 81 mg at 11/03/18 0845 .  atorvastatin (LIPITOR) tablet 80 mg, 80 mg, Oral, Daily, Enedina Finner, MD, 80 mg at 11/03/18 1626 .  budesonide (PULMICORT) nebulizer solution 0.5 mg, 0.5 mg, Nebulization, BID, Eugenie Norrie,  NP, 0.5 mg at 11/04/18 0741 .  carvedilol (COREG) tablet 6.25 mg, 6.25 mg, Oral, BID, Enedina Finner, MD, 6.25 mg at 11/03/18 2237 .  clopidogrel (PLAVIX) tablet 75 mg, 75 mg, Oral, Daily, Enedina Finner, MD, 75 mg at 11/03/18 1626 .  divalproex (DEPAKOTE) DR tablet 750 mg, 750 mg, Oral, BID, Enedina Finner, MD, 750 mg at 11/03/18 2009 .  enoxaparin (LOVENOX) injection 40 mg, 40 mg, Subcutaneous, Q24H, Mansy, Jan A, MD, 40 mg at 11/03/18 0845 .  famotidine (PEPCID) tablet 20 mg, 20 mg, Oral, BID, Enedina Finner, MD, 20 mg at 11/03/18 2009 .  FLUoxetine (PROZAC) capsule 20 mg, 20 mg, Oral, Daily, Enedina Finner, MD, 20 mg at 11/03/18 1629 .  furosemide (LASIX) injection 40 mg, 40 mg, Intravenous, Q12H, Mansy, Jan A, MD, 40 mg at 11/04/18 0553 .  guaiFENesin (MUCINEX) 12 hr tablet 600 mg, 600 mg, Oral, BID, Mansy, Jan A, MD, 600 mg at 11/03/18 2237 .  ipratropium-albuterol (DUONEB) 0.5-2.5 (3) MG/3ML nebulizer solution 3 mL, 3 mL, Nebulization, TID, Mansy, Jan A, MD, 3 mL at 11/04/18 0741 .  ipratropium-albuterol (DUONEB) 0.5-2.5 (3) MG/3ML nebulizer solution 3 mL, 3 mL, Nebulization, Q4H PRN, Mansy, Jan A, MD .  levothyroxine (SYNTHROID) tablet 25 mcg, 25 mcg, Oral, Q0600, Enedina Finner, MD, 25 mcg at 11/04/18 0553 .  lisinopril (ZESTRIL) tablet 2.5 mg, 2.5 mg, Oral, Daily, Enedina Finner, MD, 2.5 mg at 11/03/18 1626 .  magnesium hydroxide (MILK OF MAGNESIA) suspension 30 mL, 30 mL, Oral, Daily PRN, Mansy, Jan A, MD .  metoprolol tartrate (  LOPRESSOR) injection 2.5-5 mg, 2.5-5 mg, Intravenous, Q6H PRN, [place] CasterBlakeney, Neldon Newportana G, NP .  ondansetron (ZOFRAN) tablet 4 mg, 4 mg, Oral, Q6H PRN **OR** ondansetron (ZOFRAN) injection 4 mg, 4 mg, Intravenous, Q6H PRN, Mansy, Jan A, MD .  predniSONE (DELTASONE) tablet 50 mg, 50 mg, Oral, Q breakfast, Enedina FinnerPatel, Sona, MD .  QUEtiapine (SEROQUEL) tablet 25 mg, 25 mg, Oral, Daily, Harlon DittyKeene, Jeremiah D, NP .  QUEtiapine (SEROQUEL) tablet 50 mg, 50 mg, Oral, QHS, Harlon DittyKeene, Jeremiah D, NP, 50 mg at  11/03/18 2237 .  sodium chloride flush (NS) 0.9 % injection 3 mL, 3 mL, Intravenous, Q12H, Mansy, Jan A, MD, 3 mL at 11/03/18 0846 .  sodium chloride flush (NS) 0.9 % injection 3 mL, 3 mL, Intravenous, PRN, Mansy, Jan A, MD .  tiotropium [facility](SPIRIVA) inhalation capsule ([facility] use ONLY) 18 mcg, 1 capsule, Inhalation, Daily, Enedina FinnerPatel, Sona, MD .  traZODone (DESYREL) tablet 25 mg, 25 mg, Oral, QHS PRN, Mansy, Jan A, MD, 25 mg at 11/03/18 2237 .  vitamin B-12 (CYANOCOBALAMIN) tablet 1,000 mcg, 1,000 mcg, Oral, Daily, Enedina FinnerPatel, Sona, MD, 1,000 mcg at 11/03/18 1626    ALLERGIES   Iodinated diagnostic agents; Penicillins; Strawberry extract; Cefepime; Eggs or egg-derived products; Erythromycin; and Sulfa antibiotics    REVIEW OF SYSTEMS     10 point ROS negative except as per subjective findings.   PHYSICAL EXAMINATION   Vitals:   11/04/18 0500 11/04/18 0600  BP:  115/69  Pulse: 67 62  Resp: (!) 29 19  Temp:    SpO2: 97% 97%    GENERAL: No apparent distress HEAD: Normocephalic, atraumatic.  EYES: Pupils equal, round, reactive to light.  No scleral icterus.  MOUTH: Moist mucosal membrane. NECK: Supple. No thyromegaly. No nodules. No JVD.  PULMONARY: Mild rhonchorous breath sounds CARDIOVASCULAR: S1 and S2. Regular rate and rhythm. No murmurs, rubs, or gallops.  GASTROINTESTINAL: Soft, nontender, non-distended. No masses. Positive bowel sounds. No hepatosplenomegaly.  MUSCULOSKELETAL: No swelling, clubbing, or edema.  NEUROLOGIC: Mild distress due to acute illness SKIN:intact,warm,dry   LABS AND IMAGING       LAB RESULTS: Recent Labs  Lab 11/03/18 0205 11/03/18 0748 11/04/18 0415  NA 139 137 140  K 3.9 3.7 4.7  CL 106 108 110  CO2 8* 17* 21*  BUN 18 22* 32*  CREATININE 1.39* 1.23 1.22  GLUCOSE 204* 160* 128*   Recent Labs  Lab 11/03/18 0205 11/03/18 0842 11/04/18 0415  HGB 12.3* 11.0* 9.7*  HCT 40.9 33.6* 29.8*  WBC 17.8* 9.9 12.3*  PLT 212 203 209     IMAGING  RESULTS: No results found.    ASSESSMENT AND PLAN   Acute hypoxic respiratory failure secondary to AECOPD  Hx: Tobacco Abuse  Supplemental O2 for dyspnea and/or hypoxia Prn and scheduled bronchodilator therapy  -Improved optimized for downgrade to medical floor  Atrial fibrillation with rvr-improving Elevated troponin likely demand ischemia in setting of acute respiratory failure  Hx: HTN, CAD, NSTEMI, and RVAD Continuous telemetry monitoring  Trend troponin  Echo pending  Continue outpatient cardiac medications   Acute renal failure  Metabolic and lactic acidosis  Trend BMP and lactic acid Replace electrolytes as indicated  Monitor UOP Avoid nephrotoxic medications   Leukocytosis in setting of possible atypical respiratory infection-COVID-19 NEGATIVE Trend WBC and monitor fever curve  Follow cultures    Anemia without obvious acute blood loss  VTE px: subq lovenox  Trend CBC  Monitor for s/sx of bleeding and transfuse for hgb <7  Hyperglycemia  Hx: Hypothyroidism CBG's ac/hs SSI  Hemoglobin A1c pending  Continue synthroid   Bipolar 1 disorder Hx: Seizures and Depression  Continue outpatient seroquel, fluoxetine, and depakote   This document was prepared using Dragon voice recognition software and may include unintentional dictation errors.    Vida Rigger, M.D.  Division of Pulmonary & Critical Care Medicine  [facility]

## 2018-11-04 NOTE — TOC Transition Note (Signed)
Transition of Care [facility]) - CM/SW Discharge Note   Patient Details  Name: Carl [name] MRN: [id number] Date of Birth: Dec 11, 1960  Transition of Care [facility]) CM/SW Contact:  [name], RN Phone Number: 11/04/2018, 2:42 PM   Clinical Narrative:   [facility] has accepted patient for home health services.  [facility] is working up patient for NIV at home.  Nebulizer delivered to room by [facility] with [facility].  Daughter will transport patient home.      Final next level of care: Home w Home Health Services Barriers to Discharge: No Barriers Identified   Patient Goals and CMS Choice Patient states their goals for this hospitalization and ongoing recovery are:: Wants to go home not rehab CMS Medicare.gov Compare Post Acute Care list provided to:: Patient Choice offered to / list presented to : Patient, Adult Children  Discharge Placement                       Discharge Plan and Services   Discharge Planning Services: CM Consult Post Acute Care Choice: Home Health          DME Arranged: Nebulizer machine DME Agency: [facility] Date DME Agency Contacted: 11/04/18 Time DME Agency Contacted: 1134 Representative spoke with at DME Agency: Mitchell Heir HH Arranged: RN, PT, OT, Nurse's Aide, Social Work [employer] Agency: [facility] Date HH Agency Contacted: 11/04/18 Time HH Agency Contacted: 1200 Representative spoke with at [facility] Agency: Barbara Cower  Social Determinants of Health (SDOH) Interventions     Readmission Risk Interventions Readmission Risk Prevention Plan 11/03/2018  Transportation Screening Complete  PCP or Specialist Appt within 5-7 Days Not Complete  Not Complete comments still in ICU  Home Care Screening Complete  Medication Review (RN CM) Complete

## 2018-11-04 NOTE — Progress Notes (Signed)
Pharmacy Electrolyte Monitoring Consult:  Pharmacy consulted to assist in monitoring and replacing electrolytes in this 58 y.o. male admitted on 11/03/2018 with Shortness of Breath   Labs:  Sodium (mmol/L)  Date Value  11/04/2018 140   Potassium (mmol/L)  Date Value  11/04/2018 4.7   Calcium (mg/dL)  Date Value  37/04/6268 9.2   Albumin (g/dL)  Date Value  48/54/6270 3.8    Assessment/Plan: Patient receiving furosemide 40mg  IV Q12hr. No replacement warranted.   Will replace to maintain electrolytes with in normal limits.   Pharmacy will continue to monitor and adjust per consult.   Jaquavion Mccannon L 11/04/2018 3:46 PM

## 2018-11-04 NOTE — Progress Notes (Signed)
Pt's FEV1 was 52%.

## 2018-11-04 NOTE — Progress Notes (Signed)
Initial Nutrition Assessment  RD working remotely.  DOCUMENTATION CODES:   Not applicable  INTERVENTION:  Provide Ensure Enlive po BID, each supplement provides 350 kcal and 20 grams of protein.  Encouraged adequate intake of protein at meals.  NUTRITION DIAGNOSIS:   Increased nutrient needs related to catabolic illness(AECOPD, CHF) as evidenced by estimated needs.  GOAL:   Patient will meet greater than or equal to 90% of their needs  MONITOR:   PO intake, Supplement acceptance, Labs, Weight trends, I & O's  REASON FOR ASSESSMENT:   Consult COPD Protocol  ASSESSMENT:   58 year old male with PMHx of COPD, bipolar 1 disorder, HTN, hx NSTEMI, hx placement of RVAD in 2017, hx CVA, hx seizures admitted with severe COPD exacerbation, acute on chronic CHF.   Spoke with patient over the phone to obtain nutrition and weight history. It was very difficult to obtain thorough history from patient as he kept getting off track and talking about unrelated things. Patient reports overall his appetite is decreased but he is unsure how long it has been like that. He is unable to describe specifics of intake PTA. Here he is eating about 50%, which is less than his usual intake. He is amenable to drinking ONS to help meet calorie/protein needs. Patient reports UBW is around 180 lbs. He thought he had lost down to 160 lbs. However, per weight taken yesterday he is 83.8 kg (184.75 lbs).   Medications reviewed and include: famotidine, Lasix 40 mg Q12hrs IV, levothyroxine, lisinopril, prednisone 50 mg daily, Seroquel, vitamin B12 1000 micrograms daily.  Labs reviewed: CO2 21, BUN 32.  Unable to determine if patient meets criteria for malnutrition at this time.  NUTRITION - FOCUSED PHYSICAL EXAM:  Unable to complete at this time.  Diet Order:   Diet Order            Diet Heart Room service appropriate? Yes; Fluid consistency: Thin  Diet effective now             EDUCATION NEEDS:   Not  appropriate for education at this time  Skin:  Skin Assessment: Reviewed RN Assessment  Last BM:  11/03/2018 - medium type 6  Height:   Ht Readings from Last 1 Encounters:  11/03/18 5\' 9"  (1.753 m)   Weight:   Wt Readings from Last 1 Encounters:  11/03/18 83.8 kg   Ideal Body Weight:  72.7 kg  BMI:  Body mass index is 27.28 kg/m.  Estimated Nutritional Needs:   Kcal:  2000-2200  Protein:  100-110 grams  Fluid:  1.8-2 L/day  Helane Rima, MS, RD, LDN Office: [phone] Pager: [phone] After Hours/Weekend Pager: [phone]

## 2018-11-04 NOTE — TOC Progression Note (Signed)
Transition of Care [facility]) - Progression Note    Patient Details  Name: Carl [name] MRN: [id number] Date of Birth: [birth year]  Transition of Care [facility]) CM/SW Contact  [name], RN Phone Number: 11/04/2018, 11:41 AM  Clinical Narrative:     Patient is being discharged today.  PT recommended SNF.  This RNCM spoke with patient and daughter Jeanice Lim and they both feel it would be best for him to return home with home health services.  Notified Dr. Juliene Pina via secure chat and will wait for orders.  Jeanice Lim states she can pick up her dad at anytime.  She also states he has had this knee buckling going on for about 4-5 months and uses a cane.  Offered a rolling walker with seat.  Referral made for walker to [facility] with [facility].  Referral for home health services to [facility] for RN, PT, OT, aide and SW.       Expected Discharge Plan: Home w Home Health Services Barriers to Discharge: No Barriers Identified  Expected Discharge Plan and Services Expected Discharge Plan: Home w Home Health Services   Discharge Planning Services: CM Consult Post Acute Care Choice: Home Health Living arrangements for the past 2 months: Single Family Home Expected Discharge Date: 11/04/18               DME Arranged: Dan Humphreys rolling with seat DME Agency: [facility] Date DME Agency Contacted: 11/04/18 Time DME Agency Contacted: 1134 Representative spoke with at DME Agency: Mitchell Heir HH Arranged: RN, PT, OT, Nurse's Aide, Social Work           Social Determinants of Health (SDOH) Interventions    Readmission Risk Interventions Readmission Risk Prevention Plan 11/03/2018  Transportation Screening Complete  PCP or Specialist Appt within 5-7 Days Not Complete  Not Complete comments still in ICU  Home Care Screening Complete  Medication Review (RN CM) Complete

## 2018-11-04 NOTE — Discharge Summary (Addendum)
[facility] - [employer] at [facility]   PATIENT NAME: Carl [name]    MR#:  [id number]  DATE OF BIRTH:  1960-09-03  DATE OF ADMISSION:  11/03/2018 ADMITTING PHYSICIAN: Hannah Beat, MD  DATE OF DISCHARGE: 11/04/2018  PRIMARY CARE PHYSICIAN: [facility]    ADMISSION DIAGNOSIS:  Demand ischemia (HCC) [I24.8] COPD exacerbation (HCC) [J44.1] Elevated troponin I level [R79.89] Elevated lactic acid level [R79.89] Acute respiratory failure with hypoxia and hypercapnia (HCC) [J96.01, J96.02] Acute on chronic congestive heart failure, unspecified heart failure type (HCC) [I50.9]  DISCHARGE DIAGNOSIS:  Active Problems:   Acute respiratory failure (HCC)   SECONDARY DIAGNOSIS:   Past Medical History:  Diagnosis Date  . Acute pulmonary edema (HCC) 2017  . Acute respiratory failure with hypoxia (HCC) 2017  . Bipolar 1 disorder (HCC)   . COPD (chronic obstructive pulmonary disease) (HCC)   . Depression   . Hypertension   . Myocardial infarction (HCC)   . NSTEMI (non-ST elevated myocardial infarction) (HCC)   . 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 Seizures (HCC)    childhood  . Stroke (HCC)   . Tobacco abuse     HOSPITAL COURSE:  58 year old male with a history of COPD and chronic systolic heart failure status post AICD/CABG who presented to the emergency room due to acute onset of severe respiratory distress.  1.  Acute respiratory failure with hypoxia due to acute COPD exacerbation and acute on chronic systolic heart failure with ejection fraction 30 to 35%: Patient was ruled out for pneumonia and COVID-19. Patient is now on room air.  Patient symptoms have much improved. Patient was diuresed with IV Lasix.Continue PO Lasix.  He will continue with steroids with outpatient inhalers. CHF clinic referral at discharge.   2.  CAD status post CABG/PCI and stents: Patient will continue outpatient regimen including aspirin,  Lipitor, Coreg, Plavix  3.  Depression: Continue Prozac, type pain  4.  HypoThyroid: Continue Synthroid  5.  Essential hypertension: Continue Coreg and lisinopril.  6. Left knee pain chronic: Patient orthopedic surgery consultation has been requested.   DISCHARGE CONDITIONS AND DIET:   Stable Cardiac diet  CONSULTS OBTAINED:  Treatment Team:  Pccm, Raymond Gurney, MD Enedina Finner, MD  DRUG ALLERGIES:   Allergies  Allergen Reactions  . Iodinated Diagnostic Agents Shortness Of Breath  . Penicillins Anaphylaxis and Shortness Of Breath    Respiratory  Tolerated cefuroxime on 01/06/16  . Strawberry Extract Anaphylaxis  . Cefepime Itching    Empiric antibiotic, developed pruritis.   . Eggs Or Egg-Derived Products Itching    Medicine related, pt tolerates ingestion of eggs  . Erythromycin Itching  . Sulfa Antibiotics Itching, Nausea And Vomiting and Nausea Only    DISCHARGE MEDICATIONS:   Allergies as of 11/04/2018      Reactions   Iodinated Diagnostic Agents Shortness Of Breath   Penicillins Anaphylaxis, Shortness Of Breath   Respiratory  Tolerated cefuroxime on 01/06/16   Strawberry Extract Anaphylaxis   Cefepime Itching   Empiric antibiotic, developed pruritis.    Eggs Or Egg-derived Products Itching   Medicine related, pt tolerates ingestion of eggs   Erythromycin Itching   Sulfa Antibiotics Itching, Nausea And Vomiting, Nausea Only      Medication List    TAKE these medications   albuterol 108 (90 Base) MCG/ACT inhaler Commonly known as:  VENTOLIN HFA Inhale 2 puffs into the lungs every 6 (six) hours as needed for wheezing.   aspirin  EC 81 MG tablet Take 1 tablet by mouth daily.   atorvastatin 80 MG tablet Commonly known as:  LIPITOR Take 1 tablet by mouth daily.   carvedilol 6.25 MG tablet Commonly known as:  COREG Take 1 tablet by mouth 2 (two) times daily.   clopidogrel 75 MG tablet Commonly known as:  PLAVIX Take 1 tablet by mouth daily.    divalproex 250 MG DR tablet Commonly known as:  DEPAKOTE Take 750 mg by mouth 2 (two) times a day.   famotidine 20 MG tablet Commonly known as:  PEPCID Take 1 tablet by mouth 2 (two) times a day.   FLUoxetine 20 MG capsule Commonly known as:  PROZAC Take 1 capsule by mouth daily.   furosemide 40 MG tablet Commonly known as:  Lasix Take 1 tablet (40 mg total) by mouth 2 (two) times daily. What changed:    medication strength  See the new instructions.   levothyroxine 25 MCG tablet Commonly known as:  SYNTHROID Take 1 tablet by mouth daily.   lisinopril 2.5 MG tablet Commonly known as:  ZESTRIL Take 1 tablet by mouth daily.   nicotine 14 mg/24hr patch Commonly known as:  NICODERM CQ - dosed in mg/24 hours Place 1 patch onto the skin daily.   predniSONE 50 MG tablet Commonly known as:  DELTASONE Take 1 tablet for 4 days daily then stopl Start taking on:  November 05, 2018   QUEtiapine 25 MG tablet Commonly known as:  SEROQUEL Take by mouth. Take 1 tablet (25 mg total) by mouth every morning and 2 tablets (50 mg total) nightly   Spiriva Respimat 2.5 MCG/ACT Aers Generic drug:  Tiotropium Bromide Monohydrate Inhale 2 puffs into the lungs daily.   therapeutic multivitamin-minerals tablet Take 1 tablet by mouth daily.   vitamin B-12 1000 MCG tablet Commonly known as:  CYANOCOBALAMIN Take 1 tablet by mouth daily.            Durable Medical Equipment  (From admission, onward)         Start     Ordered   11/04/18 1133  For home use only DME 4 wheeled rolling walker with seat  Once    Question:  Patient needs a walker to treat with the following condition  Answer:  Weakness   11/04/18 1132   11/04/18 1132  For home use only DME Walker rolling  Once    Question:  Patient needs a walker to treat with the following condition  Answer:  Weakness   11/04/18 1131            Today   CHIEF COMPLAINT:  Doing much better no sob or chest pain   VITAL SIGNS:   Blood pressure 115/69, pulse 62, temperature 98.5 F (36.9 C), temperature source Oral, resp. rate 19, height  (1.753 m), weight 83.8 kg, SpO2 97 %.   REVIEW OF SYSTEMS:  Review of Systems  Constitutional: Negative.  Negative for chills, fever and malaise/fatigue.  HENT: Negative.  Negative for ear discharge, ear pain, hearing loss, nosebleeds and sore throat.   Eyes: Negative.  Negative for blurred vision and pain.  Respiratory: Negative.  Negative for cough, hemoptysis, shortness of breath and wheezing.   Cardiovascular: Negative.  Negative for chest pain, palpitations and leg swelling.  Gastrointestinal: Negative.  Negative for abdominal pain, blood in stool, diarrhea, nausea and vomiting.  Genitourinary: Negative.  Negative for dysuria.  Musculoskeletal: Positive for joint pain. Negative for back pain.  Skin: Negative.  Neurological: Negative for dizziness, tremors, speech change, focal weakness, seizures and headaches.  Endo/Heme/Allergies: Negative.  Does not bruise/bleed easily.  Psychiatric/Behavioral: Negative.  Negative for depression, hallucinations and suicidal ideas.     PHYSICAL EXAMINATION:  GENERAL:  58 y.o.-year-old patient lying in the bed with no acute distress.  NECK:  Supple, no jugular venous distention. No thyroid enlargement, no tenderness.  LUNGS: Normal breath sounds bilaterally, no wheezing, rales,rhonchi  No use of accessory muscles of respiration.  CARDIOVASCULAR: S1, S2 normal. No murmurs, rubs, or gallops.  ABDOMEN: Soft, non-tender, non-distended. Bowel sounds present. No organomegaly or mass.  EXTREMITIES: No pedal edema, cyanosis, or clubbing.  PSYCHIATRIC: The patient is alert and oriented x 3.  SKIN: No obvious rash, lesion, or ulcer.   DATA REVIEW:   CBC Recent Labs  Lab 11/04/18 0415  WBC 12.3*  HGB 9.7*  HCT 29.8*  PLT 209    Chemistries  Recent Labs  Lab 11/03/18 0205  11/04/18 0415  NA 139   < > 140  K 3.9   < > 4.7   CL 106   < > 110  CO2 8*   < > 21*  GLUCOSE 204*   < > 128*  BUN 18   < > 32*  CREATININE 1.39*   < > 1.22  CALCIUM 9.1   < > 9.2  AST 40  --   --   ALT 27  --   --   ALKPHOS 128*  --   --   BILITOT 1.2  --   --    < > = values in this interval not displayed.    Cardiac Enzymes Recent Labs  Lab 11/03/18 0748 11/03/18 1607 11/03/18 1920  TROPONINI 0.11* 0.08* 0.08*    Microbiology Results  @MICRORSLT48 @  RADIOLOGY:  Dg Chest Portable 1 View  Result Date: 11/03/2018 CLINICAL DATA:  58 year old male with increased shortness of breath, wheezing. EXAM: PORTABLE CHEST 1 VIEW COMPARISON:  None. FINDINGS: Portable AP upright view at 0205 hours. Cardiomegaly with left chest cardiac AICD. Prior median sternotomy and cardiac valve replacement. Other mediastinal contours are within normal limits. Normal lung volumes with mildly increased bilateral pulmonary interstitial opacity, symmetric. No pneumothorax, pleural effusion or consolidation. Prior instrumentation of the left clavicle. Visualized tracheal air column is within normal limits. IMPRESSION: Cardiomegaly with increased bilateral pulmonary interstitial opacity. Consider acute interstitial edema versus viral/atypical respiratory infection. No pleural effusion identified. Electronically Signed   By: Odessa FlemingH  Hall M.D.   On: 11/03/2018 02:54      Allergies as of 11/04/2018      Reactions   Iodinated Diagnostic Agents Shortness Of Breath   Penicillins Anaphylaxis, Shortness Of Breath   Respiratory  Tolerated cefuroxime on 01/06/16   Strawberry Extract Anaphylaxis   Cefepime Itching   Empiric antibiotic, developed pruritis.    Eggs Or Egg-derived Products Itching   Medicine related, pt tolerates ingestion of eggs   Erythromycin Itching   Sulfa Antibiotics Itching, Nausea And Vomiting, Nausea Only      Medication List    TAKE these medications   albuterol 108 (90 Base) MCG/ACT inhaler Commonly known as:  VENTOLIN HFA Inhale 2  puffs into the lungs every 6 (six) hours as needed for wheezing.   aspirin EC 81 MG tablet Take 1 tablet by mouth daily.   atorvastatin 80 MG tablet Commonly known as:  LIPITOR Take 1 tablet by mouth daily.   carvedilol 6.25 MG tablet Commonly known as:  COREG Take 1  tablet by mouth 2 (two) times daily.   clopidogrel 75 MG tablet Commonly known as:  PLAVIX Take 1 tablet by mouth daily.   divalproex 250 MG DR tablet Commonly known as:  DEPAKOTE Take 750 mg by mouth 2 (two) times a day.   famotidine 20 MG tablet Commonly known as:  PEPCID Take 1 tablet by mouth 2 (two) times a day.   FLUoxetine 20 MG capsule Commonly known as:  PROZAC Take 1 capsule by mouth daily.   furosemide 40 MG tablet Commonly known as:  Lasix Take 1 tablet (40 mg total) by mouth 2 (two) times daily. What changed:    medication strength  See the new instructions.   levothyroxine 25 MCG tablet Commonly known as:  SYNTHROID Take 1 tablet by mouth daily.   lisinopril 2.5 MG tablet Commonly known as:  ZESTRIL Take 1 tablet by mouth daily.   nicotine 14 mg/24hr patch Commonly known as:  NICODERM CQ - dosed in mg/24 hours Place 1 patch onto the skin daily.   predniSONE 50 MG tablet Commonly known as:  DELTASONE Take 1 tablet for 4 days daily then stopl Start taking on:  November 05, 2018   QUEtiapine 25 MG tablet Commonly known as:  SEROQUEL Take by mouth. Take 1 tablet (25 mg total) by mouth every morning and 2 tablets (50 mg total) nightly   Spiriva Respimat 2.5 MCG/ACT Aers Generic drug:  Tiotropium Bromide Monohydrate Inhale 2 puffs into the lungs daily.   therapeutic multivitamin-minerals tablet Take 1 tablet by mouth daily.   vitamin B-12 1000 MCG tablet Commonly known as:  CYANOCOBALAMIN Take 1 tablet by mouth daily.            Durable Medical Equipment  (From admission, onward)         Start     Ordered   11/04/18 1133  For home use only DME 4 wheeled rolling walker  with seat  Once    Question:  Patient needs a walker to treat with the following condition  Answer:  Weakness   11/04/18 1132   11/04/18 1132  For home use only DME Walker rolling  Once    Question:  Patient needs a walker to treat with the following condition  Answer:  Weakness   11/04/18 1131            Management plans discussed with the patient and he is in agreement. Stable for discharge   Patient should follow up with pcp  CODE STATUS:     Code Status Orders  (From admission, onward)         Start     Ordered   11/03/18 0346  Full code  Continuous     11/03/18 0352        Code Status History    This patient has a current code status but no historical code status.      TOTAL TIME TAKING CARE OF THIS PATIENT: 38 minutes.    Note: This dictation was prepared with Dragon dictation along with smaller phrase technology. Any transcriptional errors that result from this process are unintentional.  Adrian Saran M.D on 11/04/2018 at 12:54 PM  Between 7am to 6pm - Pager - [phone] After 6pm go to [email or web address] - Social research officer, government  [facility]  Office  [phone]  CC: Primary care physician; [facility]

## 2018-11-08 LAB — CULTURE, BLOOD (ROUTINE X 2)
Culture: NO GROWTH
Culture: NO GROWTH
Special Requests: ADEQUATE
Special Requests: ADEQUATE

## 2018-12-23 ENCOUNTER — Encounter: Payer: Self-pay | Admitting: Emergency Medicine

## 2018-12-23 ENCOUNTER — Emergency Department: Payer: Medicaid Other

## 2018-12-23 ENCOUNTER — Emergency Department
Admission: EM | Admit: 2018-12-23 | Discharge: 2018-12-24 | Disposition: A | Discharge status: Other Acute Inpatient Hospital | Payer: Medicaid Other | Attending: Emergency Medicine | Admitting: Emergency Medicine

## 2018-12-23 ENCOUNTER — Other Ambulatory Visit: Payer: Self-pay

## 2018-12-23 DIAGNOSIS — I252 Old myocardial infarction: Secondary | ICD-10-CM | POA: Insufficient documentation

## 2018-12-23 DIAGNOSIS — F17200 Nicotine dependence, unspecified, uncomplicated: Secondary | ICD-10-CM | POA: Diagnosis not present

## 2018-12-23 DIAGNOSIS — J449 Chronic obstructive pulmonary disease, unspecified: Secondary | ICD-10-CM | POA: Diagnosis not present

## 2018-12-23 DIAGNOSIS — Z03818 Encounter for observation for suspected exposure to other biological agents ruled out: Secondary | ICD-10-CM | POA: Insufficient documentation

## 2018-12-23 DIAGNOSIS — I10 Essential (primary) hypertension: Secondary | ICD-10-CM | POA: Diagnosis not present

## 2018-12-23 DIAGNOSIS — Z8673 Personal history of transient ischemic attack (TIA), and cerebral infarction without residual deficits: Secondary | ICD-10-CM | POA: Insufficient documentation

## 2018-12-23 DIAGNOSIS — Z119 Encounter for screening for infectious and parasitic diseases, unspecified: Secondary | ICD-10-CM

## 2018-12-23 DIAGNOSIS — F319 Bipolar disorder, unspecified: Secondary | ICD-10-CM | POA: Diagnosis present

## 2018-12-23 DIAGNOSIS — R4689 Other symptoms and signs involving appearance and behavior: Secondary | ICD-10-CM | POA: Insufficient documentation

## 2018-12-23 DIAGNOSIS — Z79899 Other long term (current) drug therapy: Secondary | ICD-10-CM | POA: Insufficient documentation

## 2018-12-23 LAB — CBC
HCT: 38.9 % — ABNORMAL LOW (ref 39.0–52.0)
Hemoglobin: 12.8 g/dL — ABNORMAL LOW (ref 13.0–17.0)
MCH: 29.4 pg (ref 26.0–34.0)
MCHC: 32.9 g/dL (ref 30.0–36.0)
MCV: 89.2 fL (ref 80.0–100.0)
Platelets: 203 10*3/uL (ref 150–400)
RBC: 4.36 MIL/uL (ref 4.22–5.81)
RDW: 16.3 % — ABNORMAL HIGH (ref 11.5–15.5)
WBC: 8.1 10*3/uL (ref 4.0–10.5)
nRBC: 0 % (ref 0.0–0.2)

## 2018-12-23 LAB — ACETAMINOPHEN LEVEL: Acetaminophen (Tylenol), Serum: <10 ug/mL — ABNORMAL LOW (ref 10–30)

## 2018-12-23 LAB — SARS CORONAVIRUS 2 BY RT PCR (HOSPITAL ORDER, PERFORMED IN ~~LOC~~ HOSPITAL LAB): SARS Coronavirus 2: NEGATIVE

## 2018-12-23 LAB — URINE DRUG SCREEN, QUALITATIVE (ARMC ONLY)
Amphetamines, Ur Screen: NOT DETECTED
Barbiturates, Ur Screen: NOT DETECTED
Benzodiazepine, Ur Scrn: NOT DETECTED
Cannabinoid 50 Ng, Ur ~~LOC~~: NOT DETECTED
Cocaine Metabolite,Ur ~~LOC~~: NOT DETECTED
MDMA (Ecstasy)Ur Screen: NOT DETECTED
Methadone Scn, Ur: NOT DETECTED
Opiate, Ur Screen: NOT DETECTED
Phencyclidine (PCP) Ur S: NOT DETECTED
Tricyclic, Ur Screen: POSITIVE — AB

## 2018-12-23 LAB — COMPREHENSIVE METABOLIC PANEL
ALT: 22 U/L (ref 0–44)
AST: 32 U/L (ref 15–41)
Albumin: 4.2 g/dL (ref 3.5–5.0)
Alkaline Phosphatase: 68 U/L (ref 38–126)
Anion gap: 12 (ref 5–15)
BUN: 29 mg/dL — ABNORMAL HIGH (ref 6–20)
CO2: 23 mmol/L (ref 22–32)
Calcium: 9.7 mg/dL (ref 8.9–10.3)
Chloride: 104 mmol/L (ref 98–111)
Creatinine, Ser: 1.34 mg/dL — ABNORMAL HIGH (ref 0.61–1.24)
GFR calc Af Amer: >60 mL/min (ref >60)
GFR calc non Af Amer: 57 mL/min — ABNORMAL LOW (ref >60)
Glucose, Bld: 100 mg/dL — ABNORMAL HIGH (ref 70–99)
Potassium: 4.5 mmol/L (ref 3.5–5.1)
Sodium: 139 mmol/L (ref 135–145)
Total Bilirubin: 0.7 mg/dL (ref 0.3–1.2)
Total Protein: 8.1 g/dL (ref 6.5–8.1)

## 2018-12-23 LAB — URINALYSIS, ROUTINE W REFLEX MICROSCOPIC
Bacteria, UA: NONE SEEN
Bilirubin Urine: NEGATIVE
Glucose, UA: NEGATIVE mg/dL
Ketones, ur: NEGATIVE mg/dL
Leukocytes,Ua: NEGATIVE
Nitrite: NEGATIVE
Protein, ur: NEGATIVE mg/dL
Specific Gravity, Urine: 1.016 (ref 1.005–1.030)
pH: 6 (ref 5.0–8.0)

## 2018-12-23 LAB — LIPID PANEL
Cholesterol: 175 mg/dL (ref 0–200)
HDL: 38 mg/dL — ABNORMAL LOW (ref >40)
LDL Cholesterol: 104 mg/dL — ABNORMAL HIGH (ref 0–99)
Total CHOL/HDL Ratio: 4.6 RATIO
Triglycerides: 167 mg/dL — ABNORMAL HIGH (ref <150)
VLDL: 33 mg/dL (ref 0–40)

## 2018-12-23 LAB — TSH: TSH: 6.132 u[IU]/mL — ABNORMAL HIGH (ref 0.350–4.500)

## 2018-12-23 LAB — ETHANOL: Alcohol, Ethyl (B): <10 mg/dL (ref <10)

## 2018-12-23 LAB — SALICYLATE LEVEL: Salicylate Lvl: <7 mg/dL (ref 2.8–30.0)

## 2018-12-23 LAB — VALPROIC ACID LEVEL: Valproic Acid Lvl: 130 ug/mL — ABNORMAL HIGH (ref 50.0–100.0)

## 2018-12-23 MED ORDER — ASPIRIN EC 81 MG PO TBEC
81.0000 mg | DELAYED_RELEASE_TABLET | Freq: Every day | ORAL | Status: DC
  Administered 2018-12-24: 81 mg via ORAL
  Filled 2018-12-23: qty 1

## 2018-12-23 MED ORDER — CLOPIDOGREL BISULFATE 75 MG PO TABS
75.0000 mg | ORAL_TABLET | Freq: Every day | ORAL | Status: DC
  Administered 2018-12-24: 75 mg via ORAL
  Filled 2018-12-23: qty 1

## 2018-12-23 MED ORDER — QUETIAPINE FUMARATE 25 MG PO TABS
25.0000 mg | ORAL_TABLET | Freq: Two times a day (BID) | ORAL | Status: DC
  Administered 2018-12-24 (×2): 25 mg via ORAL
  Filled 2018-12-23 (×2): qty 1

## 2018-12-23 MED ORDER — TIOTROPIUM BROMIDE MONOHYDRATE 18 MCG IN CAPS
1.0000 | ORAL_CAPSULE | Freq: Every day | RESPIRATORY_TRACT | Status: DC
  Administered 2018-12-24: 18 ug via RESPIRATORY_TRACT
  Filled 2018-12-23: qty 5

## 2018-12-23 MED ORDER — LEVOTHYROXINE SODIUM 50 MCG PO TABS
25.0000 ug | ORAL_TABLET | Freq: Every day | ORAL | Status: DC
  Administered 2018-12-24: 25 ug via ORAL
  Filled 2018-12-23: qty 1

## 2018-12-23 MED ORDER — ATORVASTATIN CALCIUM 20 MG PO TABS
80.0000 mg | ORAL_TABLET | Freq: Every day | ORAL | Status: DC
  Administered 2018-12-24: 80 mg via ORAL
  Filled 2018-12-23: qty 4

## 2018-12-23 MED ORDER — QUETIAPINE FUMARATE 25 MG PO TABS
50.0000 mg | ORAL_TABLET | Freq: Every day | ORAL | Status: DC
  Administered 2018-12-23: 50 mg via ORAL
  Filled 2018-12-23: qty 2

## 2018-12-23 MED ORDER — ALBUTEROL SULFATE (2.5 MG/3ML) 0.083% IN NEBU: 3.0000 mL | INHALATION_SOLUTION | Freq: Four times a day (QID) | RESPIRATORY_TRACT | Status: DC | PRN

## 2018-12-23 MED ORDER — ADULT MULTIVITAMIN W/MINERALS CH
1.0000 | ORAL_TABLET | Freq: Every day | ORAL | Status: DC
  Administered 2018-12-24: 1 via ORAL
  Filled 2018-12-23: qty 1

## 2018-12-23 MED ORDER — LISINOPRIL 5 MG PO TABS
2.5000 mg | ORAL_TABLET | Freq: Every day | ORAL | Status: DC
  Administered 2018-12-24: 2.5 mg via ORAL
  Filled 2018-12-23: qty 1

## 2018-12-23 MED ORDER — DIVALPROEX SODIUM 500 MG PO DR TAB
750.0000 mg | DELAYED_RELEASE_TABLET | Freq: Two times a day (BID) | ORAL | Status: DC
  Administered 2018-12-23 – 2018-12-24 (×2): 750 mg via ORAL
  Filled 2018-12-23 (×2): qty 1

## 2018-12-23 MED ORDER — FAMOTIDINE 20 MG PO TABS
40.0000 mg | ORAL_TABLET | Freq: Two times a day (BID) | ORAL | Status: DC
  Administered 2018-12-23 – 2018-12-24 (×2): 40 mg via ORAL
  Filled 2018-12-23 (×2): qty 2

## 2018-12-23 MED ORDER — CARVEDILOL 6.25 MG PO TABS
6.2500 mg | ORAL_TABLET | Freq: Two times a day (BID) | ORAL | Status: DC
  Administered 2018-12-23 – 2018-12-24 (×2): 6.25 mg via ORAL
  Filled 2018-12-23 (×2): qty 1

## 2018-12-23 MED ORDER — VITAMIN B-12 1000 MCG PO TABS
1000.0000 ug | ORAL_TABLET | Freq: Every day | ORAL | Status: DC
  Administered 2018-12-24: 1000 ug via ORAL
  Filled 2018-12-23: qty 1

## 2018-12-23 NOTE — BH Assessment (Signed)
Writer called and spoke daughter [phone]). Updated her on patient's disposition. Recommended for inpatient treatment. Daughter requesting patient transfer to [facility]. Writer explained to her, [facility] hospital admit patient's from their ER and system first which results in psych patient's not transferring to their facility. However, Probation officer will try, per the family request.  Writer also explained, if he's unable to get a bed there, he will try other hospitals.  Daughter stated she understood and in agreement with the plan.
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
## 2018-12-23 NOTE — ED Notes (Signed)
Patient was sent from triage patient not dressed out ,patient was undressed and placed in our scrubs ,patient's clothes 1 pair of gray socks, 1 blue jeans, 1 red long sleeve shirt with buttons,,1 black belt , 1 pair blue underwear,1 pair of brown boots ,1 cell phone with 1 white charger all things placed in belonging bag and labeled

## 2018-12-23 NOTE — ED Notes (Signed)
Pt. Taken to X-ray.
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
## 2018-12-23 NOTE — ED Notes (Signed)
Pt. Waiting in hallway bed for additional tests.  Pt. Calm and cooperative at this time.

## 2018-12-23 NOTE — ED Notes (Signed)
IV[facility] PENDING  CONSULT ?
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
## 2018-12-23 NOTE — BH Assessment (Signed)
Referral information for Psychiatric Hospitalization faxed to;   [name] [phone]),   . [facility] [phone] -or- [phone]) [phone]  . [facility] [phone]---[phone]),  . [facility] [phone], [phone], [phone] or [phone]),   . [facility] [phone]),   . [facility] [phone] or [phone])  . [facility] [phone] or [phone]),   . [facility] [phone])  . [facility] ([phone]) Declined, they have no beds.

## 2018-12-23 NOTE — ED Notes (Signed)
Sent urine to lab
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
## 2018-12-23 NOTE — BH Assessment (Signed)
Per the request of Psych MD (Dr. Leverne Humbles), writer contacted [facility] and spoke with on call social worker. Writer submitted an APS report.
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
## 2018-12-24 DIAGNOSIS — F319 Bipolar disorder, unspecified: Secondary | ICD-10-CM

## 2018-12-24 NOTE — ED Provider Notes (Signed)
-----------------------------------------   4:53 AM on 12/24/2018 -----------------------------------------   Blood pressure 136/75, pulse 67, temperature 97.8 F (36.6 C), temperature source Oral, resp. rate 14, height 1.753 m (5\' 9" ), weight 83.8 kg, SpO2 100 %.  The patient is calm and cooperative at this time.  There have been no acute events since the last update.  Awaiting disposition plan from Behavioral Medicine team.    Hinda Kehr, MD 12/24/18 [phone]

## 2018-12-24 NOTE — ED Notes (Signed)
[facility]  DEPT  CALLED  FOR  TRANSFER  TO  OLD  [facility]

## 2018-12-24 NOTE — ED Notes (Signed)
EMTALA reviewed by charge RN 
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
## 2018-12-24 NOTE — ED Notes (Signed)
Patient ate 100% of lunch and beverage.  

## 2018-12-24 NOTE — BH Assessment (Signed)
Patient has been accepted to [facility].  Patient assigned to [place] Unit A Patient assigned to Bed: 107 Accepting physician is Dr. Dareen Piano.  Call report to [phone].  Representative was Roderic Palau (Intake Clinician).   ER Staff is aware of it: Lattie Haw, ER Secretary  Dr. Cinda Quest, ER MD  Berton Lan, Patient's Nurse     Patient's Family/Support System [phone]) have been updated as well.

## 2019-03-21 ENCOUNTER — Emergency Department: Payer: Medicaid Other

## 2019-03-21 ENCOUNTER — Inpatient Hospital Stay
Admission: EM | Admit: 2019-03-21 | Discharge: 2019-03-23 | DRG: 191 | Disposition: A | Discharge status: Home / Self Care | Payer: Medicaid Other | Attending: Internal Medicine | Admitting: Internal Medicine

## 2019-03-21 ENCOUNTER — Other Ambulatory Visit: Payer: Self-pay

## 2019-03-21 ENCOUNTER — Encounter: Payer: Self-pay | Admitting: Emergency Medicine

## 2019-03-21 DIAGNOSIS — I110 Hypertensive heart disease with heart failure: Secondary | ICD-10-CM | POA: Diagnosis present

## 2019-03-21 DIAGNOSIS — Z79899 Other long term (current) drug therapy: Secondary | ICD-10-CM

## 2019-03-21 DIAGNOSIS — F319 Bipolar disorder, unspecified: Secondary | ICD-10-CM | POA: Diagnosis present

## 2019-03-21 DIAGNOSIS — I5022 Chronic systolic (congestive) heart failure: Secondary | ICD-10-CM | POA: Diagnosis present

## 2019-03-21 DIAGNOSIS — I447 Left bundle-branch block, unspecified: Secondary | ICD-10-CM | POA: Diagnosis present

## 2019-03-21 DIAGNOSIS — Z91041 Radiographic dye allergy status: Secondary | ICD-10-CM

## 2019-03-21 DIAGNOSIS — Z881 Allergy status to other antibiotic agents status: Secondary | ICD-10-CM

## 2019-03-21 DIAGNOSIS — K5900 Constipation, unspecified: Secondary | ICD-10-CM | POA: Diagnosis present

## 2019-03-21 DIAGNOSIS — Z7982 Long term (current) use of aspirin: Secondary | ICD-10-CM

## 2019-03-21 DIAGNOSIS — F17210 Nicotine dependence, cigarettes, uncomplicated: Secondary | ICD-10-CM | POA: Diagnosis present

## 2019-03-21 DIAGNOSIS — J441 Chronic obstructive pulmonary disease with (acute) exacerbation: Principal | ICD-10-CM | POA: Diagnosis present

## 2019-03-21 DIAGNOSIS — E039 Hypothyroidism, unspecified: Secondary | ICD-10-CM | POA: Diagnosis present

## 2019-03-21 DIAGNOSIS — Z20828 Contact with and (suspected) exposure to other viral communicable diseases: Secondary | ICD-10-CM | POA: Diagnosis present

## 2019-03-21 DIAGNOSIS — Z825 Family history of asthma and other chronic lower respiratory diseases: Secondary | ICD-10-CM

## 2019-03-21 DIAGNOSIS — I252 Old myocardial infarction: Secondary | ICD-10-CM

## 2019-03-21 DIAGNOSIS — Z882 Allergy status to sulfonamides status: Secondary | ICD-10-CM

## 2019-03-21 DIAGNOSIS — R531 Weakness: Secondary | ICD-10-CM | POA: Diagnosis present

## 2019-03-21 DIAGNOSIS — Z7951 Long term (current) use of inhaled steroids: Secondary | ICD-10-CM

## 2019-03-21 DIAGNOSIS — I69398 Other sequelae of cerebral infarction: Secondary | ICD-10-CM

## 2019-03-21 DIAGNOSIS — Z880 Allergy status to penicillin: Secondary | ICD-10-CM

## 2019-03-21 DIAGNOSIS — Z79890 Hormone replacement therapy (postmenopausal): Secondary | ICD-10-CM

## 2019-03-21 LAB — COMPREHENSIVE METABOLIC PANEL
ALT: 36 U/L (ref 0–44)
AST: 45 U/L — ABNORMAL HIGH (ref 15–41)
Albumin: 4.2 g/dL (ref 3.5–5.0)
Alkaline Phosphatase: 193 U/L — ABNORMAL HIGH (ref 38–126)
Anion gap: 14 (ref 5–15)
BUN: 25 mg/dL — ABNORMAL HIGH (ref 6–20)
CO2: 22 mmol/L (ref 22–32)
Calcium: 10.1 mg/dL (ref 8.9–10.3)
Chloride: 101 mmol/L (ref 98–111)
Creatinine, Ser: 1.2 mg/dL (ref 0.61–1.24)
GFR calc Af Amer: >60 mL/min (ref >60)
GFR calc non Af Amer: >60 mL/min (ref >60)
Glucose, Bld: 121 mg/dL — ABNORMAL HIGH (ref 70–99)
Potassium: 3.8 mmol/L (ref 3.5–5.1)
Sodium: 137 mmol/L (ref 135–145)
Total Bilirubin: 0.6 mg/dL (ref 0.3–1.2)
Total Protein: 8.1 g/dL (ref 6.5–8.1)

## 2019-03-21 LAB — BRAIN NATRIURETIC PEPTIDE: B Natriuretic Peptide: 766 pg/mL — ABNORMAL HIGH (ref 0.0–100.0)

## 2019-03-21 LAB — CBC WITH DIFFERENTIAL/PLATELET
Abs Immature Granulocytes: 0.02 10*3/uL (ref 0.00–0.07)
Basophils Absolute: 0.1 10*3/uL (ref 0.0–0.1)
Basophils Relative: 1 %
Eosinophils Absolute: 0.2 10*3/uL (ref 0.0–0.5)
Eosinophils Relative: 2 %
HCT: 36.8 % — ABNORMAL LOW (ref 39.0–52.0)
Hemoglobin: 12.4 g/dL — ABNORMAL LOW (ref 13.0–17.0)
Immature Granulocytes: 0 %
Lymphocytes Relative: 25 %
Lymphs Abs: 2.1 10*3/uL (ref 0.7–4.0)
MCH: 29.7 pg (ref 26.0–34.0)
MCHC: 33.7 g/dL (ref 30.0–36.0)
MCV: 88.2 fL (ref 80.0–100.0)
Monocytes Absolute: 0.8 10*3/uL (ref 0.1–1.0)
Monocytes Relative: 10 %
Neutro Abs: 5.2 10*3/uL (ref 1.7–7.7)
Neutrophils Relative %: 62 %
Platelets: 290 10*3/uL (ref 150–400)
RBC: 4.17 MIL/uL — ABNORMAL LOW (ref 4.22–5.81)
RDW: 15.7 % — ABNORMAL HIGH (ref 11.5–15.5)
WBC: 8.4 10*3/uL (ref 4.0–10.5)
nRBC: 0 % (ref 0.0–0.2)

## 2019-03-21 LAB — TROPONIN I (HIGH SENSITIVITY): Troponin I (High Sensitivity): 32 ng/L — ABNORMAL HIGH (ref <18)

## 2019-03-21 LAB — SARS CORONAVIRUS 2 BY RT PCR (HOSPITAL ORDER, PERFORMED IN ~~LOC~~ HOSPITAL LAB): SARS Coronavirus 2: NEGATIVE

## 2019-03-21 MED ORDER — METHYLPREDNISOLONE SODIUM SUCC 125 MG IJ SOLR
80.0000 mg | Freq: Once | INTRAMUSCULAR | Status: AC
  Administered 2019-03-21: 80 mg via INTRAVENOUS
  Filled 2019-03-21: qty 2

## 2019-03-21 MED ORDER — IPRATROPIUM-ALBUTEROL 0.5-2.5 (3) MG/3ML IN SOLN
3.0000 mL | Freq: Once | RESPIRATORY_TRACT | Status: AC
  Administered 2019-03-21: 22:00:00 3 mL via RESPIRATORY_TRACT
  Filled 2019-03-21: qty 3

## 2019-03-21 MED ORDER — IPRATROPIUM-ALBUTEROL 0.5-2.5 (3) MG/3ML IN SOLN
3.0000 mL | Freq: Once | RESPIRATORY_TRACT | Status: AC
  Administered 2019-03-22: 3 mL via RESPIRATORY_TRACT
  Filled 2019-03-21: qty 3

## 2019-03-21 MED ORDER — FUROSEMIDE 10 MG/ML IJ SOLN
40.0000 mg | Freq: Once | INTRAMUSCULAR | Status: AC
  Administered 2019-03-21: 40 mg via INTRAVENOUS
  Filled 2019-03-21: qty 4

## 2019-03-21 NOTE — ED Triage Notes (Signed)
Per family c/o for breathing difficulties, and change of mental status.

## 2019-03-21 NOTE — ED Provider Notes (Signed)
[name] Emergency Department Provider Note  ____________________________________________   First MD Initiated Contact with Patient 03/21/19 2110     (approximate)  I have reviewed the triage vital signs and the nursing notes.   HISTORY  Chief Complaint Weakness    HPI Leanord AsalWiley Brecht is a 58 y.o. male with past medical history of COPD, heart failure with EF 25 to 30%, bipolar disorder, chronic, cyst, here with shortness of breath.  The patient reports that he was outside earlier today when he developed acute onset of severe shortness of breath.  Felt like he cannot catch his breath.  He had associated wheezing and increased cough.  He felt like he had some dull, substernal chest pressure as well.  Since then, he has not been able to catch his breath.  He tried using his inhaler which did mildly improve it, but has since returned.  Denies any fevers or chills.  No sputum production.  No leg swelling, but he does endorse sensation that his abdomen is increasingly swollen as well as his testicles, which she has had when he goes into CHF in the past.  He has been trying to take his medications but has intermittent difficulty obtaining them.  Has not seen cardiology in "a while."  Denies any other complaints.  No known COVID Exposures.        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 No pertinent surgical history.  Prior to Admission medications   Medication Sig Start Date End Date Taking? Authorizing Provider  albuterol (PROVENTIL) (2.5 MG/3ML) 0.083% nebulizer solution Take 2.5 mg by nebulization every 6 (six) hours as needed for wheezing or shortness of breath.   Yes [provider]  albuterol (VENTOLIN HFA) 108 (90 Base) MCG/ACT inhaler Inhale 2 puffs into the lungs every 6 (six) hours as needed for wheezing. 08/06/18 03/22/19 Yes [provider]  aspirin EC 81 MG tablet Take 81 mg by mouth daily.   Yes [provider]  atorvastatin (LIPITOR) 80 MG tablet Take 1 tablet by mouth daily. 08/06/18 03/22/19 Yes [provider]  carvedilol (COREG) 6.25 MG tablet Take 1 tablet by mouth 2 (two) times daily. 09/10/18 03/22/19 Yes [provider]  clopidogrel (PLAVIX) 75 MG tablet Take 1 tablet by mouth daily. 11/17/18 05/23/19 Yes [provider]  divalproex (DEPAKOTE ER) 500 MG 24 hr tablet Take 1 tablet by mouth 2 (two) times daily. 01/12/19  Yes [provider]  escitalopram (LEXAPRO) 10 MG tablet Take 1 tablet by mouth at bedtime. 01/12/19  Yes [provider]  furosemide (LASIX) 40 MG tablet Take 1 tablet by mouth 2 (two) times daily. 03/19/19 04/18/19 Yes [provider]  levothyroxine (SYNTHROID) 25 MCG tablet Take 1 tablet by mouth daily. 08/06/18 03/22/19 Yes [provider]  lisinopril (ZESTRIL) 2.5 MG tablet Take 1 tablet by mouth daily. 08/06/18 03/22/19 Yes [provider]  Multiple Vitamin (MULTI-VITAMIN) tablet Take 1 tablet by mouth daily. 08/06/18 06/02/19 Yes [provider]  QUEtiapine (SEROQUEL) 25 MG tablet Take by mouth. Take 2 tablets (50 mg total) by mouth every morning, 2 tablets at lunch, and 3 tablets (75 mg total) nightly 10/20/18 03/22/19 Yes [provider]  Tiotropium Bromide Monohydrate (SPIRIVA RESPIMAT) 2.5  MCG/ACT AERS Inhale 2 puffs into the lungs daily. 08/06/18 03/22/19 Yes [provider]  traZODone (DESYREL) 100 MG tablet Take 1 tablet by mouth at bedtime. 03/19/19  Yes [provider]  vitamin B-12 (CYANOCOBALAMIN) 1000 MCG tablet Take 1 tablet by mouth daily. 08/06/18  Yes [provider]    Allergies Iodinated diagnostic agents, Penicillins, Strawberry extract, Cefepime, Eggs or egg-derived products, Erythromycin, and Sulfa antibiotics  History reviewed. No pertinent family history.  Social History Social History   Tobacco Use   Smoking status: Current Every Day Smoker    Packs/day: 0.50    Types: Cigarettes   Smokeless tobacco: Never Used  Substance Use Topics   Alcohol use: Not Currently   Drug use: Never    Review of Systems  Review of Systems  Constitutional: Positive for fatigue. Negative for chills and fever.  HENT: Negative for sore throat.   Respiratory: Positive for cough, shortness of breath and wheezing.   Cardiovascular: Positive for leg swelling. Negative for chest pain.  Gastrointestinal: Positive for abdominal distention. Negative for abdominal pain.  Genitourinary: Negative for flank pain.  Musculoskeletal: Negative for neck pain.  Skin: Negative for rash and wound.  Allergic/Immunologic: Negative for immunocompromised state.  Neurological: Positive for weakness. Negative for numbness.  Hematological: Does not bruise/bleed easily.  All other systems reviewed and are negative.    ____________________________________________  PHYSICAL EXAM:      VITAL SIGNS: ED Triage Vitals  Enc Vitals Group     BP 03/21/19 2104 (!) 150/86     Pulse Rate 03/21/19 2104 89     Resp 03/21/19 2104 (!) 36     Temp 03/21/19 2104 98.9 F (37.2 C)     Temp src --      SpO2 03/21/19 2104 99 %     Weight 03/21/19 2106 177 lb (80.3 kg)     Height 03/21/19 2106 5\' 9"  (1.753 m)     Head Circumference --      Peak Flow --      Pain Score  03/21/19 2104 0     Pain Loc --      Pain Edu? --      Excl. in [place]? --      Physical Exam Vitals signs and nursing note reviewed.  Constitutional:      General: He is not in acute distress.    Appearance: He is well-developed.  HENT:     Head: Normocephalic and atraumatic.  Eyes:     Conjunctiva/sclera: Conjunctivae normal.  Neck:     Musculoskeletal: Neck supple.  Cardiovascular:     Rate and Rhythm: Normal rate and regular rhythm.     Heart sounds: Normal heart sounds. No murmur. No friction rub.  Pulmonary:     Effort: Respiratory distress present.     Breath sounds: Wheezing present. No rales.  Abdominal:     General: There is no distension.     Palpations: Abdomen is soft.     Tenderness: There is no abdominal tenderness.  Musculoskeletal:  Right lower leg: Edema present.     Left lower leg: Edema present.  Skin:    General: Skin is warm.     Capillary Refill: Capillary refill takes less than 2 seconds.  Neurological:     Mental Status: He is alert and oriented to person, place, and time.     Motor: No abnormal muscle tone.       ____________________________________________   LABS (all labs ordered are listed, but only abnormal results are displayed)  Labs Reviewed  BLOOD GAS, VENOUS - Abnormal; Notable for the following components:      Result Value   pH, Ven 7.46 (*)    pCO2, Ven 34 (*)    pO2, Ven 47.0 (*)    All other components within normal limits  CBC WITH DIFFERENTIAL/PLATELET - Abnormal; Notable for the following components:   RBC 4.17 (*)    Hemoglobin 12.4 (*)    HCT 36.8 (*)    RDW 15.7 (*)    All other components within normal limits  COMPREHENSIVE METABOLIC PANEL - Abnormal; Notable for the following components:   Glucose, Bld 121 (*)    BUN 25 (*)    AST 45 (*)    Alkaline Phosphatase 193 (*)    All other components within normal limits  BRAIN NATRIURETIC PEPTIDE - Abnormal; Notable for the following components:   B Natriuretic  Peptide 766.0 (*)    All other components within normal limits  TROPONIN I (HIGH SENSITIVITY) - Abnormal; Notable for the following components:   Troponin I (High Sensitivity) 32 (*)    All other components within normal limits  TROPONIN I (HIGH SENSITIVITY) - Abnormal; Notable for the following components:   Troponin I (High Sensitivity) 43 (*)    All other components within normal limits  SARS CORONAVIRUS 2 (HOSPITAL ORDER, PERFORMED IN [facility])  HIV ANTIBODY (ROUTINE TESTING W REFLEX)    ____________________________________________  EKG: NSR, VR 75, QRS 188, QTc 508. LBBB with multiform PVCs. No acute ischemia. ________________________________________  RADIOLOGY All imaging, including plain films, CT scans, and ultrasounds, independently reviewed by me, and interpretations confirmed via formal radiology reads.  ED MD interpretation:   CXR: Cardiomegaly, no PNA, no PTX  Official radiology report(s): Dg Chest Portable 1 View  Result Date: 03/21/2019 CLINICAL DATA:  Respiratory distress EXAM: PORTABLE CHEST 1 VIEW COMPARISON:  Radiograph December 23, 2018 FINDINGS: Pacer/defibrillator pack overlies the left chest wall with leads in stable position of the right atrium and cardiac apex. Postsurgical changes related to prior CABG including intact and aligned sternotomy wires and multiple surgical clips projecting over the mediastinum. Prior open reduction internal fixation of the left clavicle, unchanged appearance from comparison. The heart is somewhat enlarged though this may be in part due to the portable technique streaky opacities present in the bases are most compatible with atelectasis. No convincing evidence of edema. No consolidative process. No pneumothorax or visible effusion. No acute osseous or soft tissue abnormality. IMPRESSION: Borderline cardiomegaly, similar to priors. Basilar atelectasis, no other acute cardiopulmonary abnormality Electronically Signed   By:  Kreg Shropshire M.D.   On: 03/21/2019 21:37    ____________________________________________  PROCEDURES   Procedure(s) performed (including Critical Care):  Procedures  ____________________________________________  INITIAL IMPRESSION / MDM / ASSESSMENT AND PLAN / ED COURSE  As part of my medical decision making, I reviewed the following data within the electronic MEDICAL RECORD NUMBER Notes from prior ED visits and [place] Controlled Substance Database      *  Leanord AsalWiley Nold was evaluated in Emergency Department on 03/22/2019 for the symptoms described in the history of present illness. He was evaluated in the context of the global COVID-19 pandemic, which necessitated consideration that the patient might be at risk for infection with the SARS-CoV-2 virus that causes COVID-19. Institutional protocols and algorithms that pertain to the evaluation of patients at risk for COVID-19 are in a state of rapid change based on information released by regulatory bodies including the CDC and federal and state organizations. These policies and algorithms were followed during the patient's care in the ED.  Some ED evaluations and interventions may be delayed as a result of limited staffing during the pandemic.*      Medical Decision Making: 58 yo M here with wheezing, SOB. EKG nonischemic. Diffuse wheezing noted on exam and acuity of onset makes COPD exacerbation more likely, though could also be a component of CHF/edema given his history. COVID neg. No signs of infection or PNA. Admit to medicine.  ____________________________________________  FINAL CLINICAL IMPRESSION(S) / ED DIAGNOSES  Final diagnoses:  COPD exacerbation (HCC)  Chronic systolic congestive heart failure (HCC)     MEDICATIONS GIVEN DURING THIS VISIT:  Medications  atorvastatin (LIPITOR) tablet 80 mg (has no administration in time range)  carvedilol (COREG) tablet 6.25 mg (6.25 mg Oral Not Given 03/22/19 0143)  vitamin B-12 (CYANOCOBALAMIN)  tablet 1,000 mcg (has no administration in time range)  levothyroxine (SYNTHROID) tablet 25 mcg (has no administration in time range)  lisinopril (ZESTRIL) tablet 2.5 mg (has no administration in time range)  QUEtiapine (SEROQUEL) tablet 25 mg (has no administration in time range)  aspirin EC tablet 81 mg (has no administration in time range)  enoxaparin (LOVENOX) injection 40 mg (has no administration in time range)  ipratropium-albuterol (DUONEB) 0.5-2.5 (3) MG/3ML nebulizer solution 3 mL (has no administration in time range)  albuterol (PROVENTIL) (2.5 MG/3ML) 0.083% nebulizer solution 2.5 mg (has no administration in time range)  predniSONE (DELTASONE) tablet 40 mg (has no administration in time range)  ipratropium-albuterol (DUONEB) 0.5-2.5 (3) MG/3ML nebulizer solution 3 mL (3 mLs Nebulization Given 03/21/19 2144)  furosemide (LASIX) injection 40 mg (40 mg Intravenous Given 03/21/19 2356)  ipratropium-albuterol (DUONEB) 0.5-2.5 (3) MG/3ML nebulizer solution 3 mL (3 mLs Nebulization Given 03/22/19 0005)  methylPREDNISolone sodium succinate (SOLU-MEDROL) 125 mg/2 mL injection 80 mg (80 mg Intravenous Given 03/21/19 2359)     ED Discharge Orders    None       Note:  This document was prepared using Dragon voice recognition software and may include unintentional dictation errors.   Shaune PollackIsaacs, Tion Tse, MD 03/22/19 [phone]

## 2019-03-22 ENCOUNTER — Other Ambulatory Visit: Payer: Self-pay

## 2019-03-22 DIAGNOSIS — Z7982 Long term (current) use of aspirin: Secondary | ICD-10-CM | POA: Diagnosis not present

## 2019-03-22 DIAGNOSIS — I69398 Other sequelae of cerebral infarction: Secondary | ICD-10-CM | POA: Diagnosis not present

## 2019-03-22 DIAGNOSIS — Z20828 Contact with and (suspected) exposure to other viral communicable diseases: Secondary | ICD-10-CM | POA: Diagnosis present

## 2019-03-22 DIAGNOSIS — F319 Bipolar disorder, unspecified: Secondary | ICD-10-CM | POA: Diagnosis present

## 2019-03-22 DIAGNOSIS — K5900 Constipation, unspecified: Secondary | ICD-10-CM | POA: Diagnosis present

## 2019-03-22 DIAGNOSIS — Z880 Allergy status to penicillin: Secondary | ICD-10-CM | POA: Diagnosis not present

## 2019-03-22 DIAGNOSIS — Z91041 Radiographic dye allergy status: Secondary | ICD-10-CM | POA: Diagnosis not present

## 2019-03-22 DIAGNOSIS — I5022 Chronic systolic (congestive) heart failure: Secondary | ICD-10-CM | POA: Diagnosis present

## 2019-03-22 DIAGNOSIS — Z825 Family history of asthma and other chronic lower respiratory diseases: Secondary | ICD-10-CM | POA: Diagnosis not present

## 2019-03-22 DIAGNOSIS — J441 Chronic obstructive pulmonary disease with (acute) exacerbation: Secondary | ICD-10-CM | POA: Diagnosis present

## 2019-03-22 DIAGNOSIS — I110 Hypertensive heart disease with heart failure: Secondary | ICD-10-CM | POA: Diagnosis present

## 2019-03-22 DIAGNOSIS — Z7951 Long term (current) use of inhaled steroids: Secondary | ICD-10-CM | POA: Diagnosis not present

## 2019-03-22 DIAGNOSIS — F17210 Nicotine dependence, cigarettes, uncomplicated: Secondary | ICD-10-CM | POA: Diagnosis present

## 2019-03-22 DIAGNOSIS — E039 Hypothyroidism, unspecified: Secondary | ICD-10-CM | POA: Diagnosis present

## 2019-03-22 DIAGNOSIS — I252 Old myocardial infarction: Secondary | ICD-10-CM | POA: Diagnosis not present

## 2019-03-22 DIAGNOSIS — R531 Weakness: Secondary | ICD-10-CM | POA: Diagnosis present

## 2019-03-22 DIAGNOSIS — Z79890 Hormone replacement therapy (postmenopausal): Secondary | ICD-10-CM | POA: Diagnosis not present

## 2019-03-22 DIAGNOSIS — I447 Left bundle-branch block, unspecified: Secondary | ICD-10-CM | POA: Diagnosis present

## 2019-03-22 DIAGNOSIS — Z882 Allergy status to sulfonamides status: Secondary | ICD-10-CM | POA: Diagnosis not present

## 2019-03-22 DIAGNOSIS — Z881 Allergy status to other antibiotic agents status: Secondary | ICD-10-CM | POA: Diagnosis not present

## 2019-03-22 DIAGNOSIS — Z79899 Other long term (current) drug therapy: Secondary | ICD-10-CM | POA: Diagnosis not present

## 2019-03-22 LAB — TROPONIN I (HIGH SENSITIVITY): Troponin I (High Sensitivity): 43 ng/L — ABNORMAL HIGH (ref <18)

## 2019-03-22 MED ORDER — TIOTROPIUM BROMIDE MONOHYDRATE 2.5 MCG/ACT IN AERS: 2.0000 | INHALATION_SPRAY | Freq: Every day | RESPIRATORY_TRACT | Status: DC

## 2019-03-22 MED ORDER — FUROSEMIDE 40 MG PO TABS
40.0000 mg | ORAL_TABLET | Freq: Two times a day (BID) | ORAL | Status: DC
  Administered 2019-03-22 – 2019-03-23 (×2): 40 mg via ORAL
  Filled 2019-03-22 (×2): qty 1

## 2019-03-22 MED ORDER — BUDESONIDE 0.5 MG/2ML IN SUSP
0.5000 mg | Freq: Two times a day (BID) | RESPIRATORY_TRACT | Status: DC
  Administered 2019-03-22 – 2019-03-23 (×2): 0.5 mg via RESPIRATORY_TRACT
  Filled 2019-03-22 (×2): qty 2

## 2019-03-22 MED ORDER — CARVEDILOL 3.125 MG PO TABS
6.2500 mg | ORAL_TABLET | Freq: Two times a day (BID) | ORAL | Status: DC
  Administered 2019-03-22 – 2019-03-23 (×3): 6.25 mg via ORAL
  Filled 2019-03-22 (×3): qty 2

## 2019-03-22 MED ORDER — LISINOPRIL 5 MG PO TABS
2.5000 mg | ORAL_TABLET | Freq: Every day | ORAL | Status: DC
  Administered 2019-03-22 – 2019-03-23 (×2): 2.5 mg via ORAL
  Filled 2019-03-22 (×2): qty 1

## 2019-03-22 MED ORDER — ENOXAPARIN SODIUM 40 MG/0.4ML ~~LOC~~ SOLN
40.0000 mg | SUBCUTANEOUS | Status: DC
  Administered 2019-03-22: 40 mg via SUBCUTANEOUS
  Filled 2019-03-22: qty 0.4

## 2019-03-22 MED ORDER — OXYCODONE-ACETAMINOPHEN 5-325 MG PO TABS
1.0000 | ORAL_TABLET | Freq: Four times a day (QID) | ORAL | Status: DC | PRN
  Administered 2019-03-22: 1 via ORAL
  Filled 2019-03-22: qty 1

## 2019-03-22 MED ORDER — VITAMIN B-12 1000 MCG PO TABS
1000.0000 ug | ORAL_TABLET | Freq: Every day | ORAL | Status: DC
  Administered 2019-03-22 – 2019-03-23 (×2): 1000 ug via ORAL
  Filled 2019-03-22 (×2): qty 1

## 2019-03-22 MED ORDER — MAGNESIUM HYDROXIDE 400 MG/5ML PO SUSP
15.0000 mL | Freq: Once | ORAL | Status: AC
  Administered 2019-03-22: 23:00:00 15 mL via ORAL
  Filled 2019-03-22: qty 30

## 2019-03-22 MED ORDER — QUETIAPINE FUMARATE 25 MG PO TABS
25.0000 mg | ORAL_TABLET | Freq: Three times a day (TID) | ORAL | Status: DC
  Administered 2019-03-22 – 2019-03-23 (×4): 25 mg via ORAL
  Filled 2019-03-22 (×4): qty 1

## 2019-03-22 MED ORDER — FUROSEMIDE 20 MG PO TABS
20.0000 mg | ORAL_TABLET | Freq: Every day | ORAL | Status: DC
  Administered 2019-03-22: 12:00:00 20 mg via ORAL
  Filled 2019-03-22: qty 1

## 2019-03-22 MED ORDER — ASPIRIN EC 81 MG PO TBEC
81.0000 mg | DELAYED_RELEASE_TABLET | Freq: Every day | ORAL | Status: DC
  Administered 2019-03-22 – 2019-03-23 (×2): 81 mg via ORAL
  Filled 2019-03-22 (×2): qty 1

## 2019-03-22 MED ORDER — BUDESONIDE 0.5 MG/2ML IN SUSP: 2.0000 mg | Freq: Four times a day (QID) | RESPIRATORY_TRACT | Status: DC

## 2019-03-22 MED ORDER — DIVALPROEX SODIUM 500 MG PO DR TAB: 750.0000 mg | DELAYED_RELEASE_TABLET | Freq: Every day | ORAL | Status: DC

## 2019-03-22 MED ORDER — ALBUTEROL SULFATE (2.5 MG/3ML) 0.083% IN NEBU: 2.5000 mg | INHALATION_SOLUTION | RESPIRATORY_TRACT | Status: DC | PRN

## 2019-03-22 MED ORDER — ATORVASTATIN CALCIUM 20 MG PO TABS
80.0000 mg | ORAL_TABLET | Freq: Every day | ORAL | Status: DC
  Administered 2019-03-22 – 2019-03-23 (×2): 80 mg via ORAL
  Filled 2019-03-22 (×2): qty 4

## 2019-03-22 MED ORDER — IPRATROPIUM-ALBUTEROL 0.5-2.5 (3) MG/3ML IN SOLN
3.0000 mL | Freq: Four times a day (QID) | RESPIRATORY_TRACT | Status: DC
  Administered 2019-03-22 – 2019-03-23 (×6): 3 mL via RESPIRATORY_TRACT
  Filled 2019-03-22 (×6): qty 3

## 2019-03-22 MED ORDER — ALBUTEROL SULFATE HFA 108 (90 BASE) MCG/ACT IN AERS: 2.0000 | INHALATION_SPRAY | Freq: Four times a day (QID) | RESPIRATORY_TRACT | Status: DC | PRN

## 2019-03-22 MED ORDER — LEVOTHYROXINE SODIUM 25 MCG PO TABS
25.0000 ug | ORAL_TABLET | Freq: Every day | ORAL | Status: DC
  Administered 2019-03-22 – 2019-03-23 (×2): 25 ug via ORAL
  Filled 2019-03-22 (×2): qty 1

## 2019-03-22 MED ORDER — PREDNISONE 20 MG PO TABS
40.0000 mg | ORAL_TABLET | Freq: Every day | ORAL | Status: DC
  Administered 2019-03-22 – 2019-03-23 (×2): 40 mg via ORAL
  Filled 2019-03-22 (×2): qty 2

## 2019-03-22 NOTE — Evaluation (Signed)
Occupational Therapy Evaluation Patient Details Name: Carl [name] MRN: [id number] DOB: 30-Apr-1961 Today's Date: 03/22/2019    History of Present Illness 58 y.o. male with a known history of multiple medical problems including COPD, CHF, CAD s/p CABG and stents and pacemaker, Bipolar 1 disorder, HTN, depression, MI, NSTEMI, RVAD, seizures, ischemic cardiomyopathy with EF of 20%, and CVA who presented to the emergency room on 03/21/19 with c/o shortness of breath.   Clinical Impression   Carl [name] was seen for OT evaluation this date. Prior to hospital admission, Carl [name] required assistance for IADL and functional mobility. He reports significant falls history in the past 6 months due to limited balance and stability in BLE when ambulating even with assisted devices.  Carl [name] lives at home with his adult daughters, but is unclear as to how much his family is available to assist him throughout the day. Throughout the session this Carl [name] had difficulty with word finding and expressive communication. Was generally oriented to self, place, time, and limited situation. Perseverated t/o session on obtaining a wheelchair to assist him with functional mobility in the community. Carl [name] states he requires a wheel chair that will keep his knees from turning outward, and states that his legs turn out as he walks which causes him to fall. Carl [name] denied any SOB or lightheadedness t/o session and his SpO2 remained WNL (98-99) on RA.  Currently Carl [name] demonstrates impairments in balance, coordination, and BLE strength requiring CGA to moderate assistance for functional mobility and standing ADL tasks. Carl [name] would benefit from skilled OT to address noted impairments and functional limitations (see below for any additional details) in order to maximize safety and independence while minimizing falls risk and caregiver burden.  Carl [name] may be a good candidate for a wheelchair considering his significant falls history. Will continue to discuss with care team  members and monitor progress during this admission. Upon hospital discharge, recommend [facility] with supervision/assistance during functional mobility in the home.     Follow Up Recommendations  Home health OT;Supervision - Intermittent    Equipment Recommendations  3 in 1 bedside commode    Recommendations for Other Services       Precautions / Restrictions Precautions Precautions: Fall Precaution Comments: High fall Restrictions Weight Bearing Restrictions: No      Mobility Bed Mobility Overal bed mobility: Needs Assistance Bed Mobility: Supine to Sit;Sit to Supine     Supine to sit: Modified independent (Device/Increase time);Supervision Sit to supine: Modified independent (Device/Increase time);Supervision   General bed mobility comments: Carl [name] completes sup-sit more easily than sit-sup this date. Both require increased time/effort but no physical assist this date.  Transfers Overall transfer level: Needs assistance Equipment used: Rolling walker (2 wheeled) Transfers: Sit to/from Stand Sit to Stand: From elevated surface;Min assist         General transfer comment: Carl [name] completes STS from raised bed given cueing for safety and hand placement and min assist for lift-off this date. Heavy UE use on RW. Requires intermittent support during ambulation using RW.    Balance Overall balance assessment: Needs assistance Sitting-balance support: Feet supported;No upper extremity supported Sitting balance-Leahy Scale: Fair Sitting balance - Comments: Carl [name] initially required heavy UE support on bed to maintain seated balance. With encouragment is able to sit w/o UE support. Steady reaching within BOS. Limited weight shift during dynamic seated balance tasks. Postural control: Posterior lean Standing balance support: During functional activity;Bilateral upper extremity supported;Single extremity supported Standing balance-Leahy Scale: Poor Standing balance comment: Carl [name] unable to  maintain  standing balance without at least 1 UE supported. OT provided CGA to mod assist to support standing balance during functional tasks this date. Unsafe standing independently.                           ADL either performed or assessed with clinical judgement   ADL Overall ADL's : Needs assistance/impaired Eating/Feeding: Set up;Sitting   Grooming: Standing;Minimal assistance;Oral care;Cueing for safety;Cueing for sequencing Grooming Details (indicate cue type and reason): Carl [name] required CGA to min assist to maintain balance while standing at sink. Upper Body Bathing: Sitting;Minimal assistance;Min guard;Cueing for sequencing   Lower Body Bathing: Sitting/lateral leans;Moderate assistance;Cueing for sequencing   Upper Body Dressing : Sitting;Set up;Supervision/safety   Lower Body Dressing: Sit to/from stand;Minimal assistance;Moderate assistance   Toilet Transfer: Teacher, English as a foreign language;Set up;Minimal assistance;RW;Moderate assistance   Toileting- Clothing Manipulation and Hygiene: Set up;Min guard;Minimal assistance;Sit to/from stand       Functional mobility during ADLs: Rolling walker;Moderate assistance;Cueing for safety;Minimal assistance General ADL Comments: Carl [name] at times required up to mod assist to maintain standing balance during functional tasks.     Vision Baseline Vision/History: (Carl [name] states a Child psychotherapist is helping him get an eye doctor appt, but he hasn't been yet.) Patient Visual Report: No change from baseline       Perception     Praxis      Pertinent Vitals/Pain Pain Assessment: No/denies pain     Hand Dominance Right   Extremity/Trunk Assessment Upper Extremity Assessment Upper Extremity Assessment: Overall WFL for tasks assessed(BUE WFL for strength and ROM. Carl [name] noted to have some minor tremoring, particularly with resistance. Overall Carl [name] able to complete grooming, dressing, using BUE without difficulty.)   Lower Extremity Assessment Lower  Extremity Assessment: Generalized weakness;LLE deficits/detail;Defer to Carl [name] evaluation LLE Deficits / Details: Carl [name] LLE appears weaker that RLE during hip flexion and aD/Bduction. Carl [name] states his L-side weakness is residual from his past stroke. LLE Coordination: decreased gross motor       Communication Communication Communication: Expressive difficulties(Carl [name] noted to have significant difficulty with word finding on this date. Regularly had trouble expressing himself clearly. No family member present to determine if this is baseline.)   Cognition Arousal/Alertness: Awake/alert Behavior During Therapy: WFL for tasks assessed/performed;Impulsive Overall Cognitive Status: No family/caregiver present to determine baseline cognitive functioning                                 General Comments: Carl [name] required re-direction to task on occasion with limited ability to follow multi-step prompts without cueing from this therapist. Carl [name] perseverated on his percieved need for a wheelchair throughout the OT evaluation.   General Comments  SpO2 at 98 t/o session. Carl [name] denies SOB/dizziness on this date.    Exercises Other Exercises Other Exercises: Carl [name] educated on falls prevention strategies, safe use of AE for ADL mgt and functional mobility as well as limited ECS this date. Other Exercises: Carl [name] assisted with functional mobility in room and with completion of oral care on this date. OT provided up to moderate assist for Carl [name] to safely ambulate and stand at sink to brush his teeth.   Shoulder Instructions      Home Living Family/patient expects to be discharged to:: Private residence Living Arrangements: Children Available Help at Discharge: Family;Available PRN/intermittently(Carl [name] reprots family "helps out when they can" States dtr has health concerns of her own.) Type of Home: House  Home Access: Stairs to enter Entergy CorporationEntrance Stairs-Number of Steps: 3-4 Entrance Stairs-Rails: Can reach both Home Layout: One  level     Bathroom Shower/Tub: Other (comment)(Carl [name] very unclear about bathroom set-up. But states he generally sponge bathes in bathroom with assistance.)   Bathroom Toilet: Standard Bathroom Accessibility: No(Carl [name] states bathroom is very small.)   Home Equipment: Dance movement psychotherapisthower seat;Crutches;Walker - 2 wheels;Other (comment)   Additional Comments: Per chart, Carl [name] unreliable historian. Has difficulty with word finding, at times unclear about equipment he has or had used in the past.      Prior Functioning/Environment Level of Independence: Needs assistance  Gait / Transfers Assistance Needed: Carl [name] states he has used multiple adapted devices in the past including a SPC and a 2WW for functional mobiliyt, but he remains very unstable during functional mobility. Endorses significant falls history stating he falls "almost every day". ADL's / Homemaking Assistance Needed: Carl [name] states his family provides physical assist for functional mobility due to his poor balance and instability in his legs. Carl [name] states he is independent with dressing, sponge bathing, and medication mgt. Family assists with driving and community mobility.            OT Problem List: Decreased strength;Decreased coordination;Cardiopulmonary status limiting activity;Decreased range of motion;Decreased activity tolerance;Decreased safety awareness;Impaired balance (sitting and/or standing);Decreased knowledge of precautions;Decreased knowledge of use of DME or AE      OT Treatment/Interventions: Self-care/ADL training;Balance training;Therapeutic exercise;Therapeutic activities;Energy conservation;DME and/or AE instruction;Patient/family education    OT Goals(Current goals can be found in the care plan section) Acute Rehab OT Goals Patient Stated Goal: To be able to get out in the community more and have fewer falls. OT Goal Formulation: With patient Time For Goal Achievement: 04/05/19 Potential to Achieve Goals: Good ADL Goals Carl [name] Will  Perform Grooming: standing;with supervision;with min guard assist(With LRAD PRN for safety.) Carl [name] Will Perform Lower Body Bathing: with supervision;with min guard assist;sitting/lateral leans(With LRAD PRN for improved safety.) Carl [name] Will Perform Lower Body Dressing: with min guard assist;with supervision;sit to/from stand(With LRAD PRN for improved safety.) Carl [name] Will Transfer to Toilet: with min guard assist;with supervision;ambulating(With LRAD PRN for improved safety.)  OT Frequency: Min 2X/week   Barriers to D/C: Inaccessible home environment;Decreased caregiver support          Co-evaluation              AM-PAC OT "6 Clicks" Daily Activity     Outcome Measure Help from another person eating meals?: A Little Help from another person taking care of personal grooming?: A Little Help from another person toileting, which includes using toliet, bedpan, or urinal?: A Little Help from another person bathing (including washing, rinsing, drying)?: A Lot Help from another person to put on and taking off regular upper body clothing?: A Little Help from another person to put on and taking off regular lower body clothing?: A Lot 6 Click Score: 16   End of Session Equipment Utilized During Treatment: Gait belt;Rolling walker Nurse Communication: Mobility status  Activity Tolerance: Patient tolerated treatment well Patient left: in bed;with call bell/phone within reach;with bed alarm set  OT Visit Diagnosis: Other abnormalities of gait and mobility (R26.89);Muscle weakness (generalized) (M62.81);History of falling (Z91.81)                Time: [id number] OT Time Calculation (min): 34 min Charges:  OT General Charges $OT Visit: 1 Visit OT Evaluation $OT Eval Moderate Complexity: 1 Mod OT Treatments $Self Care/Home Management : 23-37 mins  Rockney GheeSerenity Courtez Twaddle, M.S.,  OTR/L Ascom: [phone] 03/22/19, 11:31 AM

## 2019-03-22 NOTE — Progress Notes (Signed)
Nutrition Brief Note  RD received consult for assessment of nutrition requirements/status per COPD gold protocol  Wt Readings from Last 15 Encounters:  03/21/19 80.3 kg  12/23/18 83.8 kg  11/03/18 83.8 kg   Patient reports his appetite is good now and at baseline. He is eating 100% of his meals here. At home he eats 100% of 3 meals daily. He reports he is weight-stable at around 175-180 lbs. Patient currently 80.3 kg (177 lbs). Patient does not meet criteria for malnutrition at this time. No nutrition questions or concerns at this time.  Body mass index is 26.14 kg/m. Patient meets criteria for overweight based on current BMI.   Current diet order is regular, patient is consuming approximately 100% of meals at this time. Labs and medications reviewed.   No nutrition interventions warranted at this time. If nutrition issues arise, please consult RD.   Willey Blade, MS, [place], LDN Office: [phone] Pager: [phone] After Hours/Weekend Pager: [phone]

## 2019-03-22 NOTE — Plan of Care (Signed)
  Problem: Education: Goal: Knowledge of General Education information will improve Description: Including pain rating scale, medication(s)/side effects and non-pharmacologic comfort measures Outcome: Progressing   Problem: Health Behavior/Discharge Planning: Goal: Ability to manage health-related needs will improve Outcome: Progressing   Problem: Clinical Measurements: Goal: Ability to maintain clinical measurements within normal limits will improve Outcome: Progressing Goal: Will remain free from infection Outcome: Progressing Goal: Diagnostic test results will improve Outcome: Progressing Goal: Respiratory complications will improve Outcome: Progressing Goal: Cardiovascular complication will be avoided Outcome: Progressing   Problem: Nutrition: Goal: Adequate nutrition will be maintained Outcome: Progressing   Problem: Activity: Goal: Risk for activity intolerance will decrease Outcome: Progressing   Problem: Coping: Goal: Level of anxiety will decrease Outcome: Progressing   Problem: Nutrition: Goal: Adequate nutrition will be maintained Outcome: Progressing   Problem: Elimination: Goal: Will not experience complications related to bowel motility Outcome: Progressing Goal: Will not experience complications related to urinary retention Outcome: Progressing   Problem: Pain Managment: Goal: General experience of comfort will improve Outcome: Progressing   Problem: Safety: Goal: Ability to remain free from injury will improve Outcome: Progressing

## 2019-03-22 NOTE — Progress Notes (Addendum)
Patient suffers from CVA, which impairs their ability to perform daily activities like  in the home.  A walking aid will not resolve  issue with performing activities of daily living. A wheelchair will allow patient to safely perform daily activities. Patient is not able to propel themselves in the home using a standard weight wheelchair due to weakness. Patient can self propel in the lightweight wheelchair. Length of need 99 months. Accessories: elevating leg rests, back cushion, wheel locks, extensions and anti-tippers.  Dr Loletha Grayer

## 2019-03-22 NOTE — H&P (Signed)
[facility] [place] [facility]   PATIENT NAME: Carl [name]    MR#:  [id number]  DATE OF BIRTH:  04-24-1961  DATE OF ADMISSION:  03/21/2019  PRIMARY CARE PHYSICIAN: [facility]   REQUESTING/REFERRING PHYSICIAN: Shaune Pollack, MD  CHIEF COMPLAINT:   Chief Complaint  Patient presents with   Weakness    HISTORY OF PRESENT ILLNESS:  Carl [name]  is a 58 y.o. male with a known history of COPD, heart failure with EF 25 to 30%, bipolar disorder is being admitted for COPD exacerbation. The patient reports that he was outside earlier today when he developed acute onset of severe shortness of breath.  Felt like he cannot catch his breath.  He had associated wheezing and increased cough.  He felt like he had some dull, substernal chest pressure as well.  Since then, he has not been able to catch his breath.  He tried using his inhaler which did mildly improve it, but has since returned.  He reports having swelling in the lower abdomen and is worried about having some urological issue as he feels he is not able to pee well.  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 No pertinent surgical history.  SOCIAL HISTORY:   Social History   Tobacco Use   Smoking status: Current Every Day Smoker    Packs/day: 0.50    Types: Cigarettes   Smokeless tobacco: Never Used  Substance Use Topics   Alcohol use: Not Currently    FAMILY HISTORY:  History reviewed.  No pertinent family history. Mother with COPD DRUG ALLERGIES:   Allergies  Allergen Reactions   Iodinated Diagnostic Agents Shortness Of Breath   Penicillins Anaphylaxis and Shortness Of Breath    Respiratory  Tolerated cefuroxime on 01/06/16   Strawberry Extract Anaphylaxis   Cefepime Itching    Empiric antibiotic, developed pruritis.    Eggs Or Egg-Derived Products Itching    Medicine related, pt tolerates ingestion of eggs   Erythromycin Itching   Sulfa Antibiotics Itching, Nausea And Vomiting and Nausea Only    REVIEW OF SYSTEMS:   Review of Systems  Constitutional: Negative for diaphoresis, fever, malaise/fatigue and weight loss.  HENT: Negative for ear discharge, ear pain, hearing loss, nosebleeds, sore throat and tinnitus.   Eyes: Negative for blurred vision and pain.  Respiratory: Positive for shortness of breath. Negative for cough, hemoptysis and wheezing.   Cardiovascular: Negative for chest pain, palpitations, orthopnea and leg swelling.  Gastrointestinal: Negative for abdominal pain, blood in stool, constipation, diarrhea, heartburn, nausea and vomiting.  Genitourinary: Negative for dysuria, frequency and urgency.  Musculoskeletal: Negative for back pain and myalgias.  Skin: Negative for itching and rash.  Neurological: Negative for dizziness, tingling, tremors, focal weakness, seizures, weakness and headaches.  Psychiatric/Behavioral: Negative for depression. The patient is not nervous/anxious.     MEDICATIONS AT HOME:   Prior to Admission medications   Medication Sig Start  Date End Date Taking? Authorizing Provider  albuterol (VENTOLIN HFA) 108 (90 Base) MCG/ACT inhaler Inhale 2 puffs into the lungs every 6 (six) hours as needed for wheezing. 08/06/18 12/23/18  [provider]  aspirin EC 81 MG tablet Take 81 mg by mouth daily.    [provider]  atorvastatin (LIPITOR) 80 MG tablet Take 1 tablet by mouth daily. 08/06/18 12/23/18  [provider]  carvedilol (COREG) 6.25 MG tablet Take 1 tablet by mouth 2 (two) times daily. 09/10/18 12/23/18  [provider]  divalproex (DEPAKOTE) 250 MG DR tablet Take 750 mg by mouth 2 (two) times a day. 10/20/18 01/18/19  [provider]  famotidine (PEPCID) 40 MG tablet Take 40 mg by mouth 2 (two) times a day.  08/06/18 12/23/18  [provider]  levothyroxine (SYNTHROID) 25 MCG tablet Take 1 tablet by mouth daily. 08/06/18 12/23/18  [provider]  lisinopril (ZESTRIL) 2.5 MG tablet Take 1 tablet by mouth daily. 08/06/18 12/23/18  [provider]  QUEtiapine (SEROQUEL) 25 MG tablet Take by mouth. Take 1 tablet (25 mg total) by mouth every morning, 1 tablet at lunch, and 2 tablets (50 mg total) nightly 10/20/18 01/28/19  [provider]  Tiotropium Bromide Monohydrate (SPIRIVA RESPIMAT) 2.5 MCG/ACT AERS Inhale 2 puffs into the lungs daily. 08/06/18 12/23/18  [provider]  vitamin B-12 (CYANOCOBALAMIN) 1000 MCG tablet Take 1 tablet by mouth daily. 08/06/18   [provider]      VITAL SIGNS:  Blood pressure 132/73, pulse 73, temperature 98.9 F (37.2 C), resp. rate 14, height 5\' 9"  (1.753 m), weight 80.3 kg, SpO2 95 %.  PHYSICAL EXAMINATION:  Physical Exam  GENERAL:  58 y.o.-year-old patient lying in the bed with no acute distress.  EYES: Pupils equal, round, reactive to light and accommodation. No scleral icterus. Extraocular muscles intact.  HEENT: Head atraumatic, normocephalic. Oropharynx and nasopharynx clear.  NECK:  Supple, no jugular venous distention. No thyroid enlargement, no tenderness.  LUNGS: Normal breath sounds bilaterally, no wheezing, rales,rhonchi or crepitation. No use of accessory muscles of respiration.  CARDIOVASCULAR: S1, S2 normal. No murmurs, rubs, or gallops.  ABDOMEN: Soft, nontender, nondistended. Bowel sounds present. No organomegaly or mass.  EXTREMITIES: No pedal edema, cyanosis, or clubbing.   NEUROLOGIC: Cranial nerves II through XII are intact. Muscle strength 5/5 in all extremities. Sensation intact. Gait not checked.  PSYCHIATRIC: The patient is alert and oriented x 3.  SKIN: No obvious rash, lesion, or ulcer.   LABORATORY PANEL:   CBC Recent Labs  Lab 03/21/19 2106  WBC 8.4  HGB 12.4*  HCT 36.8*  PLT 290   ------------------------------------------------------------------------------------------------------------------  Chemistries  Recent Labs  Lab 03/21/19 2106  NA 137  K 3.8  CL 101  CO2 22  GLUCOSE 121*  BUN 25*  CREATININE 1.20  CALCIUM 10.1  AST 45*  ALT 36  ALKPHOS 193*  BILITOT 0.6   ------------------------------------------------------------------------------------------------------------------  Cardiac Enzymes No results for input(s): TROPONINI in the last 168 hours. ------------------------------------------------------------------------------------------------------------------  RADIOLOGY:  Dg Chest Portable 1 View  Result Date: 03/21/2019 CLINICAL DATA:  Respiratory distress EXAM: PORTABLE CHEST 1 VIEW COMPARISON:  Radiograph December 23, 2018 FINDINGS: Pacer/defibrillator pack overlies the left chest wall with leads in stable position of the right atrium and cardiac apex. Postsurgical changes related to prior CABG including intact and aligned sternotomy wires and multiple surgical clips projecting over the mediastinum. Prior open reduction internal fixation of the left clavicle, unchanged appearance from comparison.  The heart is somewhat enlarged though this may be in part due to the portable technique streaky opacities present in the bases are most compatible with atelectasis. No convincing evidence of edema. No consolidative process. No pneumothorax or visible effusion. No acute osseous or soft tissue abnormality. IMPRESSION: Borderline cardiomegaly, similar to priors. Basilar atelectasis, no other acute cardiopulmonary abnormality  Electronically Signed   By: Lovena Le M.D.   On: 03/21/2019 21:37   IMPRESSION AND PLAN:  58 year old male being admitted for COPD exacerbation  *COPD exacerbation -IV steroids, nebulizer, continue inhalers  *Depression -Continue Seroquel  *Hypothyroidism -Continue Synthroid, check TSH  *Hypertension -Continue Coreg, lisinopril  *Weakness -Likely multifactorial -We will get PT and OT evaluation    All the records are reviewed and case discussed with ED provider. Management plans discussed with the patient, nursing and they are in agreement.  CODE STATUS: Full code  TOTAL TIME TAKING CARE OF THIS PATIENT: 45 minutes.    Max Sane M.D on 03/22/2019 at 12:45 AM  Between 7am to 6pm - Pager - [phone]  After 6pm go to [email or web address] - Proofreader  [facility] [place] [facility]  Office  [phone]  CC: Primary care physician; [facility]   Note: This dictation was prepared with Dragon dictation along with smaller phrase technology. Any transcriptional errors that result from this process are unintentional.

## 2019-03-22 NOTE — Plan of Care (Signed)
  Problem: Coping: Goal: Level of anxiety will decrease Outcome: Progressing   Problem: Pain Managment: Goal: General experience of comfort will improve Outcome: Progressing   Problem: Safety: Goal: Ability to remain free from injury will improve Outcome: Progressing   Problem: Skin Integrity: Goal: Risk for impaired skin integrity will decrease Outcome: Progressing   

## 2019-03-22 NOTE — Evaluation (Addendum)
Speech Language Pathology Evaluation Patient Details Name: Carl [name] MRN: [id number] DOB: 07/22/1960 Today's Date: 03/22/2019 Time: 9211-9417 SLP Time Calculation (min) (ACUTE ONLY): 45 min  Problem List:  Patient Active Problem List   Diagnosis Date Noted  . COPD exacerbation ([place]) 03/22/2019  . Bipolar 1 disorder ([place]) 12/23/2018  . Acute respiratory failure ([place]) 11/03/2018   Past Medical History:  Past Medical History:  Diagnosis Date  . Acute pulmonary edema ([place]) 2017  . Acute respiratory failure with hypoxia ([place]) 2017  . Bipolar 1 disorder ([place])   . COPD (chronic obstructive pulmonary disease) ([place])   . Depression   . Hypertension   . Myocardial infarction ([place])   . NSTEMI (non-ST elevated myocardial infarction) ([place])   . 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 Seizures ([place])    childhood  . Stroke ([place])   . Tobacco abuse    Past Surgical History: History reviewed. No pertinent surgical history. HPI:  Pt  is a 58 y.o. right-handed male with a PMHx of Bipolar I Dis, CHF(EF 25-30%), CAD, HTN, mitral insufficiency, COPD, ischemic cardiomyopathy s/p ICD and stents, hypothyroidism, left-sided MCA stroke(frontotemporal infarct), epilespy, and polysubstance abuse(tobacco use ongoing).  He was admitted w/ SOB; he has been trying to take his medications for CHF but has intermittent difficulty obtaining them.  Has not seen cardiology in "a while.".  He is asking to see Speech Therapy again as he did post his LMCA stroke prior to 2019(?).   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 Pt feels these deficits have "gotten worse" in the past few months. (Pt stated he had Speech Therapy post his CVA.) Also, unsure of pt's baseline Cognitive functioning as no family or caregiver was  present, but overt deficits were noted in his attention, memory, and problem solving/awareness of own medical issues. During this session, pt perseverated on his need to "get my body back on track again" and talked repeatedly about his PT at [facility] and not wanting to use a w/c but instead work on "moving my legs". His complaint w/ his speech was that he often "switched my Dtrs' names when calling for them", and he felt he had trouble "using his words". Noted word finding deficits, perseveration, and dysfluency during conversation. Brief education given on Slowing down when talking; further education on more strategies may be helpful. During oral motor exam, pt noted to have quite poor Dentition and a tongue tie, both of which can impact precision of speech articulation. Speech intelligibility was 100%.    Due to pt's overall presentation and his eagerness to explore further Speech Therapy, recommend formal assessment at discharge to address identified needs in his functional communication in ADLs.     SLP Assessment  SLP Recommendation/Assessment: All further Speech Lanaguage Pathology  needs can be addressed in the next venue of care SLP Visit Diagnosis: Aphasia (R47.01);Cognitive communication deficit (R41.841)    Follow Up Recommendations  Outpatient SLP    Frequency and Duration  TBD outpt        SLP Evaluation Cognition  Overall Cognitive Status: No family/caregiver present to determine baseline cognitive functioning Arousal/Alertness: Awake/alert Orientation Level: Oriented X4 Attention: Focused Focused Attention: Impaired Focused Attention Impairment: Verbal complex Memory: Impaired Memory Impairment: Decreased short term memory Awareness: Appears intact Problem Solving: Impaired Problem Solving Impairment: Verbal complex Executive Function: Reasoning Reasoning: Impaired Reasoning  Impairment: Verbal complex Behaviors: Restless;Confabulation Comments: further assessment is needed        Comprehension  Auditory Comprehension Overall Auditory Comprehension: Impaired at baseline Yes/No Questions: Within Functional Limits(basic) Commands: Within Functional Limits(basic) Conversation: Simple Other Conversation Comments: expressive deficits; motor speech deficits Interfering Components: Working memory;Processing speed;Motor planning EffectiveTechniques: Slowed speech;Pausing Visual Recognition/Discrimination Discrimination: Not tested Reading Comprehension Reading Status: Not tested    Expression Expression Primary Mode of Expression: Verbal Verbal Expression Overall Verbal Expression: Impaired Initiation: Impaired Automatic Speech: Name;Social Response;Day of week(WFL) Level of Generative/Spontaneous Verbalization: Conversation(where deficits were noted) Repetition: No impairment Naming: No impairment Interfering Components: Attention Non-Verbal Means of Communication: Not applicable Other Verbal Expression Comments: tongue tie; missing dentition Written Expression Dominant Hand: Right Written Expression: Not tested   Oral / Motor  Oral Motor/Sensory Function Overall Oral Motor/Sensory Function: Within functional limits(w/ tongue tie; missing dentition/poor condition) Motor Speech Overall Motor Speech: (unable to determine baseline deficits; tongue tie) Respiration: Within functional limits Phonation: Normal Resonance: Within functional limits   GO                      Carl SomKatherine Keri Veale, MS, CCC-SLP Carl [name] 03/22/2019, 5:02 PM

## 2019-03-22 NOTE — ED Notes (Signed)
ED TO INPATIENT HANDOFF REPORT  ED Nurse Name and Phone #: Marisue HumbleMaureen [phone]  S Name/Age/Gender Carl [name] 58 y.o. male Room/Bed: ED12A/ED12A  Code Status   Code Status: Prior  Home/SNF/Other Home Patient oriented to: self, place, time and situation Is this baseline? Yes   Triage Complete: Triage complete  Chief Complaint weakness  Triage Note Per family c/o for breathing difficulties, and change of mental status.    Allergies Allergies  Allergen Reactions  . Iodinated Diagnostic Agents Shortness Of Breath  . Penicillins Anaphylaxis and Shortness Of Breath    Respiratory  Tolerated cefuroxime on 01/06/16  . Strawberry Extract Anaphylaxis  . Cefepime Itching    Empiric antibiotic, developed pruritis.   . Eggs Or Egg-Derived Products Itching    Medicine related, pt tolerates ingestion of eggs  . Erythromycin Itching  . Sulfa Antibiotics Itching, Nausea And Vomiting and Nausea Only    Level of Care/Admitting Diagnosis ED Disposition    ED Disposition Condition Comment   Admit  [facility] [[id number]]  Level of Care: Med-Surg [16]  Covid Evaluation: Confirmed COVID Negative  Diagnosis: COPD exacerbation [facility](HCC) [[id number]][[id number]]  Admitting Physician: Delfino LovettSHAH, VIPUL [[id number]]  Attending Physician: Delfino LovettSHAH, VIPUL [[id number]]  Estimated length of stay: past midnight tomorrow  Certification:: I certify this patient will need inpatient services for at least 2 midnights  PT Class (Do Not Modify): Inpatient [101]  PT Acc Code (Do Not Modify): Private [1]       B Medical/Surgery History Past Medical History:  Diagnosis Date  . Acute pulmonary edema (HCC) 2017  . Acute respiratory failure with hypoxia (HCC) 2017  . Bipolar 1 disorder (HCC)   . COPD (chronic obstructive pulmonary disease) (HCC)   . Depression   . Hypertension   . Myocardial infarction (HCC)   . NSTEMI (non-ST elevated myocardial infarction) (HCC)   . RVAD (right ventricular assist  device) present (HCC) 01/06/2016  . Seizures (HCC)    childhood  . Stroke (HCC)   . Tobacco abuse    History reviewed. No pertinent surgical history.   A IV Location/Drains/Wounds Patient Lines/Drains/Airways Status   Active Line/Drains/Airways    Name:   Placement date:   Placement time:   Site:   Days:   Peripheral IV 03/21/19 Right Antecubital   03/21/19    2100    Antecubital   1   Peripheral IV 03/22/19 Left Antecubital   03/22/19    -    Antecubital   less than 1          Intake/Output Last 24 hours  Intake/Output Summary (Last 24 hours) at 03/22/2019 0034 Last data filed at 03/22/2019 0017 Gross per 24 hour  Intake -  Output 700 ml  Net -700 ml    Labs/Imaging Results for orders placed or performed during the hospital encounter of 03/21/19 (from the past 48 hour(s))  CBC with Differential     Status: Abnormal   Collection Time: 03/21/19  9:06 PM  Result Value Ref Range   WBC 8.4 4.0 - 10.5 K/uL   RBC 4.17 (L) 4.22 - 5.81 MIL/uL   Hemoglobin 12.4 (L) 13.0 - 17.0 g/dL   HCT 29.536.8 (L) 28.439.0 - 13.252.0 %   MCV 88.2 80.0 - 100.0 fL   MCH 29.7 26.0 - 34.0 pg   MCHC 33.7 30.0 - 36.0 g/dL   RDW 44.015.7 (H) 10.211.5 - 72.515.5 %   Platelets 290 150 - 400 K/uL   nRBC 0.0 0.0 -  0.2 %   Neutrophils Relative % 62 %   Neutro Abs 5.2 1.7 - 7.7 K/uL   Lymphocytes Relative 25 %   Lymphs Abs 2.1 0.7 - 4.0 K/uL   Monocytes Relative 10 %   Monocytes Absolute 0.8 0.1 - 1.0 K/uL   Eosinophils Relative 2 %   Eosinophils Absolute 0.2 0.0 - 0.5 K/uL   Basophils Relative 1 %   Basophils Absolute 0.1 0.0 - 0.1 K/uL   Immature Granulocytes 0 %   Abs Immature Granulocytes 0.02 0.00 - 0.07 K/uL    Comment: Performed at [facility], [address] [place], [place] [address]  Comprehensive metabolic panel     Status: Abnormal   Collection Time: 03/21/19  9:06 PM  Result Value Ref Range   Sodium 137 135 - 145 mmol/L   Potassium 3.8 3.5 - 5.1 mmol/L   Chloride 101 98 - 111 mmol/L   CO2  22 22 - 32 mmol/L   Glucose, Bld 121 (H) 70 - 99 mg/dL   BUN 25 (H) 6 - 20 mg/dL   Creatinine, Ser 1.20 0.61 - 1.24 mg/dL   Calcium 10.1 8.9 - 10.3 mg/dL   Total Protein 8.1 6.5 - 8.1 g/dL   Albumin 4.2 3.5 - 5.0 g/dL   AST 45 (H) 15 - 41 U/L   ALT 36 0 - 44 U/L   Alkaline Phosphatase 193 (H) 38 - 126 U/L   Total Bilirubin 0.6 0.3 - 1.2 mg/dL   GFR calc non Af Amer >60 >60 mL/min   GFR calc Af Amer >60 >60 mL/min   Anion gap 14 5 - 15    Comment: Performed at [facility], [address] [place], [place] [address]  Brain natriuretic peptide     Status: Abnormal   Collection Time: 03/21/19  9:06 PM  Result Value Ref Range   B Natriuretic Peptide 766.0 (H) 0.0 - 100.0 pg/mL    Comment: Performed at [facility], [place], [place] [address]  Troponin I (High Sensitivity)     Status: Abnormal   Collection Time: 03/21/19  9:06 PM  Result Value Ref Range   Troponin I (High Sensitivity) 32 (H) <18 ng/L    Comment: (NOTE) Elevated high sensitivity troponin I (hsTnI) values and significant  changes across serial measurements may suggest ACS but many other  chronic and acute conditions are known to elevate hsTnI results.  Refer to the "Links" section for chest pain algorithms and additional  guidance. Performed at [facility], [address] [place], [place] [address]   Blood gas, venous     Status: Abnormal (Preliminary result)   Collection Time: 03/21/19  9:32 PM  Result Value Ref Range   FIO2 PENDING    pH, Ven 7.46 (H) 7.250 - 7.430   pCO2, Ven 34 (L) 44.0 - 60.0 mmHg   pO2, Ven 47.0 (H) 32.0 - 45.0 mmHg   Bicarbonate 24.2 20.0 - 28.0 mmol/L   Acid-Base Excess 0.8 0.0 - 2.0 mmol/L   O2 Saturation 85.1 %   Patient temperature 37.0    Collection site VENOUS    Sample type VENOUS     Comment: Performed at [facility], [address] [place], [place] [address]   Mechanical Rate PENDING   SARS Coronavirus 2 [facility]  order, Performed in [facility] hospital lab) Nasopharyngeal Nasopharyngeal Swab     Status: None   Collection Time: 03/21/19  9:32 PM   Specimen:  Nasopharyngeal Swab  Result Value Ref Range   SARS Coronavirus 2 NEGATIVE NEGATIVE    Comment: (NOTE) If result is NEGATIVE SARS-CoV-2 target nucleic acids are NOT DETECTED. The SARS-CoV-2 RNA is generally detectable in upper and lower  respiratory specimens during the acute phase of infection. The lowest  concentration of SARS-CoV-2 viral copies this assay can detect is 250  copies / mL. A negative result does not preclude SARS-CoV-2 infection  and should not be used as the sole basis for treatment or other  patient management decisions.  A negative result may occur with  improper specimen collection / handling, submission of specimen other  than nasopharyngeal swab, presence of viral mutation(s) within the  areas targeted by this assay, and inadequate number of viral copies  (<250 copies / mL). A negative result must be combined with clinical  observations, patient history, and epidemiological information. If result is POSITIVE SARS-CoV-2 target nucleic acids are DETECTED. The SARS-CoV-2 RNA is generally detectable in upper and lower  respiratory specimens dur ing the acute phase of infection.  Positive  results are indicative of active infection with SARS-CoV-2.  Clinical  correlation with patient history and other diagnostic information is  necessary to determine patient infection status.  Positive results do  not rule out bacterial infection or co-infection with other viruses. If result is PRESUMPTIVE POSTIVE SARS-CoV-2 nucleic acids MAY BE PRESENT.   A presumptive positive result was obtained on the submitted specimen  and confirmed on repeat testing.  While 2019 novel coronavirus  (SARS-CoV-2) nucleic acids may be present in the submitted sample  additional confirmatory testing may be necessary for epidemiological  and / or  clinical management purposes  to differentiate between  SARS-CoV-2 and other Sarbecovirus currently known to infect humans.  If clinically indicated additional testing with an alternate test  methodology [phone] is advised. The SARS-CoV-2 RNA is generally  detectable in upper and lower respiratory sp ecimens during the acute  phase of infection. The expected result is Negative. Fact Sheet for Patients:  BoilerBrush.com.cyhttps://www.fda.gov/media/136312/download Fact Sheet for Healthcare Providers: https://pope.com/https://www.fda.gov/media/136313/download This test is not yet approved or cleared by the [place] FDA and has been authorized for detection and/or diagnosis of SARS-CoV-2 by FDA under an Emergency Use Authorization (EUA).  This EUA will remain in effect (meaning this test can be used) for the duration of the COVID-19 declaration under Section 564(b)(1) of the Act, 21 U.S.C. section 360bbb-3(b)(1), unless the authorization is terminated or revoked sooner. Performed at [facility], [address] [place], [place] [address]    Dg Chest Portable 1 View  Result Date: 03/21/2019 CLINICAL DATA:  Respiratory distress EXAM: PORTABLE CHEST 1 VIEW COMPARISON:  Radiograph December 23, 2018 FINDINGS: Pacer/defibrillator pack overlies the left chest wall with leads in stable position of the right atrium and cardiac apex. Postsurgical changes related to prior CABG including intact and aligned sternotomy wires and multiple surgical clips projecting over the mediastinum. Prior open reduction internal fixation of the left clavicle, unchanged appearance from comparison. The heart is somewhat enlarged though this may be in part due to the portable technique streaky opacities present in the bases are most compatible with atelectasis. No convincing evidence of edema. No consolidative process. No pneumothorax or visible effusion. No acute osseous or soft tissue abnormality. IMPRESSION: Borderline cardiomegaly, similar to  priors. Basilar atelectasis, no other acute cardiopulmonary abnormality Electronically Signed   By: Kreg ShropshirePrice  DeHay M.D.   On: 03/21/2019 21:37    Pending Labs [employer]resulted Labs (  From admission, onward)    Start     Ordered   Signed and Held  HIV antibody  Once,   R     Signed and Held   Signed and Held  CBC  (enoxaparin (LOVENOX)    CrCl >/= 30 ml/min)  Once,   R    Comments: Baseline for enoxaparin therapy IF NOT ALREADY DRAWN.  Notify MD if PLT < 100 K.    Signed and Held   Signed and Held  Creatinine, serum  (enoxaparin (LOVENOX)    CrCl >/= 30 ml/min)  Once,   R    Comments: Baseline for enoxaparin therapy IF NOT ALREADY DRAWN.    Signed and Held   Signed and Held  Creatinine, serum  (enoxaparin (LOVENOX)    CrCl >/= 30 ml/min)  Weekly,   R    Comments: while on enoxaparin therapy    Signed and Held          Vitals/Pain Today's Vitals   03/21/19 2300 03/21/19 2318 03/21/19 2330 03/22/19 0000  BP: 128/84  113/87 132/73  Pulse: 77  87 73  Resp: (!) 27  (!) 25 14  Temp:      SpO2: 95%  100% 95%  Weight:      Height:      PainSc:  0-No pain      Isolation Precautions No active isolations  Medications Medications  ipratropium-albuterol (DUONEB) 0.5-2.5 (3) MG/3ML nebulizer solution 3 mL (3 mLs Nebulization Given 03/21/19 2144)  furosemide (LASIX) injection 40 mg (40 mg Intravenous Given 03/21/19 2356)  ipratropium-albuterol (DUONEB) 0.5-2.5 (3) MG/3ML nebulizer solution 3 mL (3 mLs Nebulization Given 03/22/19 0005)  methylPREDNISolone sodium succinate (SOLU-MEDROL) 125 mg/2 mL injection 80 mg (80 mg Intravenous Given 03/21/19 2359)    Mobility walks with device Low fall risk   Focused Assessments Pulmonary Assessment Handoff:  Lung sounds: Bilateral Breath Sounds: Diminished(lower lobes) O2 Device: Room Air        R Recommendations: See Admitting Provider Note  Report given to:   Additional Notes: Pt's daughter's number in demographics

## 2019-03-22 NOTE — TOC Initial Note (Signed)
Transition of Care [facility]) - Initial/Assessment Note    Patient Details  Name: Carl [name] MRN: [id number] Date of Birth: September 25, 1960  [facility] CM/SW Contact:    Jurnei Latini, Lenice Llamas Phone Number: [phone]  03/22/2019, 2:47 PM  Clinical Narrative: Clinical Social Worker (CSW) met with patient alone at bedside to discuss D/C plan. Patient was alert and oriented X3 and was sitting up in the bed. OT is recommending home health OT and a wheel chair. Patient reported that he lives in [place] with his step daughter Helene Kelp, daughter Earnest Iyauna Sing and son in Sports coach. Patient reported that he has several canes and walkers at home. Patient requested a wheel chair. CSW sent [name] agency representative an email making him aware of above. CSW explained that barrier to home health is medicaid. CSW explained that most home health agencies will not accept medicaid. [facility], [facility] and [facility] declined home health referral. CSW explained that patient can go to outpatient therapy for PT, OT and SLP. Patient is agreeable to outpatient therapy at [facility] outpatient therapy. CSW faxed referral to [facility] outpatient therapy and confirmed they do accept Medicaid. CSW attempted to contact patient's daughter Earnest Tyyne Cliett however the phone line was busy and she did not answer. CSW will continue to follow and assist as needed.           Expected Discharge Plan: OP Rehab Barriers to Discharge: Continued Medical Work up   Patient Goals and CMS Choice Patient states their goals for this hospitalization and ongoing recovery are:: To go home.      Expected Discharge Plan and Services Expected Discharge Plan: OP Rehab In-house Referral: Clinical Social Work Discharge Planning Services: CM Consult Post Acute Care Choice: Home Health(Outpatient rehab.) Living arrangements for the past 2 months: Single Family Home Expected Discharge Date: 03/24/19               DME Arranged: Stage manager wheelchair with seat cushion DME Agency: [employer] Date DME Agency Contacted: 03/22/19 Time DME Agency Contacted: 16 Representative spoke with at DME Agency: Leroy Sea            Prior Living Arrangements/Services Living arrangements for the past 2 months: Independence with:: Adult Children Patient language and need for interpreter reviewed:: No Do you feel safe going back to the place where you live?: Yes      Need for Family Participation in Patient Care: Yes (Comment) Care giver support system in place?: Yes (comment) Current home services: DME(Patient reported that he has canes and walkers at home.) Criminal Activity/Legal Involvement Pertinent to Current Situation/Hospitalization: No - Comment as needed  Activities of Daily Living Home Assistive Devices/Equipment: Gilford Rile (specify type) ADL Screening (condition at time of admission) Patient's cognitive ability adequate to safely complete daily activities?: Yes Is the patient deaf or have difficulty hearing?: No Does the patient have difficulty seeing, even when wearing glasses/contacts?: No Does the patient have difficulty concentrating, remembering, or making decisions?: Yes Patient able to express need for assistance with ADLs?: Yes Does the patient have difficulty dressing or bathing?: Yes Independently performs ADLs?: Yes (appropriate for developmental age) Does the patient have difficulty walking or climbing stairs?: Yes Weakness of Legs: Both Weakness of Arms/Hands: None  Permission Sought/Granted Permission sought to share information with : Other (comment)(DME agnecy and outpatient PT agency.) Permission granted to share information with : Yes, Verbal Permission Granted  Emotional Assessment Appearance:: Appears stated age Attitude/Demeanor/Rapport: Engaged Affect (typically observed): Pleasant, Calm Orientation: : Oriented to Self, Oriented to Place, Oriented to  Time, Oriented  to Situation Alcohol / Substance Use: Not Applicable Psych Involvement: No (comment)  Admission diagnosis:  weakness Patient Active Problem List   Diagnosis Date Noted  . COPD exacerbation ([place]) 03/22/2019  . Bipolar 1 disorder ([place]) 12/23/2018  . Acute respiratory failure ([place]) 11/03/2018   PCP:  Physicians, [place]:   [place], [place] [address] [place] [address] Phone: [phone] Fax: [phone]     Social Determinants of Health (SDOH) Interventions    Readmission Risk Interventions Readmission Risk Prevention Plan 11/03/2018  Transportation Screening Complete  PCP or Specialist Appt within 5-7 Days Not Complete  Not Complete comments still in ICU  Home Care Screening Complete  Medication Review (RN CM) Complete

## 2019-03-22 NOTE — Evaluation (Signed)
Physical Therapy Evaluation Patient Details Name: Carl [name] MRN: [id number] DOB: 03/07/1961 Today's Date: 03/22/2019   History of Present Illness  Carl [name] is a 58yo male with PMH: COPD, CHF, CAD s/p CABG and stents and PPM, Bipolar 1 disorder, HTN, depression, MI, NSTEMI, RVAD, seizures, ischemic cardiomyopathy with EF of 20%, and multiple CVA (per patient). Pt comes to [facility] ED on 03/21/19 with c/o shortness of breath, admitted with COPD exacerbation. Pt was admitted in April with recommendations from PT at that time for [facility] and PT services at DC, pt issued a rollator but not clear whether he had PT. Pt back in June 2020 for IVC after HI toward family, DC to inpatient psych facility.  Clinical Impression  Pt admitted with above diagnosis. Pt currently with functional limitations due to the deficits listed below (see "PT Problem List"). Upon entry, pt in bed, awake and agreeable to participate. The pt is alert, pleasant, conversational, and generally a fair historian. Author finds it difficult to obtain details from patient at times, and some information is taken from prior admissions as well as what is available throughout 'care everywhere' records from [place]. In supine and sitting, full lower quarter exam reveals intact light touch sensation, abnormal testing of strength at most levels with pain/high level effort limiting ROM or end of range force production. Pt demonstrates neurological giving way with hips and knees bilat, and appears to have sciatic neural tension that limits ankle mobility. Carl [name] is negative in both hands, and tone is not tested as patient is not able to relax limbs when cued. Pt perseverates on biomechanical details of legs in sitting, transfers, and AMB that likely were offered as recommendations in the past, but no given this visit. Pt performs bed mobility and transfers at stand-by assist with some mild falls anxiety and caution, no cues needed for safety. Pt AMB ~71ft  with RW, then 46ft after a rest. Pt avoids knee flexion during the gait cycle bilat voicing concerns of buckling. Heavy UE support on RW is required for stability in AMB which is labor intensive even in the setting of sufficient strength, and in the context of EF: 20%, not ideal for limited community or community distances which would be more safely navigated with a WC. Functional mobility assessment demonstrates increased effort/time requirements, poor tolerance, and need for physical assistance, whereas the patient performed these at a higher level of independence PTA. Upon chart review, pt performed a 6MWT at [facility] prior to DC in Spring 2019 covering >560m, but has had decline in functional capacity since, etiology remains unclear to this author at this time. Pt will benefit from skilled PT intervention to increase independence and safety with basic mobility in preparation for discharge to the venue listed below. PT should be safe to DC to home from a mobility standpoint, as many of these findings are chronic, however, author strongly recommends close supervision for mobility at home and improved compliance with 4WW. Pt ideally should be using a WC for distances outside of the house.    Patient suffers from chronic leg buckling and 20% ejection fraction which impair their ability to perform daily activities like household distance ambulation in the home.  A walker alone will not resolve the issues with performing activities of daily living. A wheelchair will allow patient to safely perform daily activities.  The patient can self propel in the home or has a caregiver who can provide assistance.        Follow Up Recommendations  Supervision for mobility/OOB;Other (comment);Outpatient PT(OP neuro PT)    Equipment Recommendations  Wheelchair (measurements PT);Wheelchair cushion (measurements PT)(elevated leg rests, anti tipper devices.)    Recommendations for Other Services       Precautions /  Restrictions Precautions Precautions: Fall Restrictions Weight Bearing Restrictions: No      Mobility  Bed Mobility Overal bed mobility: Modified Independent             General bed mobility comments: additional time needed, no assist needed.  Transfers Overall transfer level: Needs assistance Equipment used: Rolling walker (2 wheeled) Transfers: Sit to/from Stand Sit to Stand: From elevated surface;Min guard;Supervision         General transfer comment: No cuing needed, good carryover from OT cues earlier in day. Heavy Effort and BUE use.  Ambulation/Gait Ambulation/Gait assistance: Min guard Gait Distance (Feet): 80 Feet(5935ft inititally; then again after rest.) Assistive device: Rolling walker (2 wheeled)       General Gait Details: high falls anxiety. Maintains normal stance width and perseverating over foot progression angle correctness/neutrality (origins unclear); Pt maintains a locke dknee gait bilat d/t concerns over bilat knee buckling AMB.(appears to have good awareness of limitations and generally stable with stand-by assist but Thereasa Parkinauthor concerned about potential for compliance with RW at DC.)  Stairs            Wheelchair Mobility    Modified Rankin (Stroke Patients Only)       Balance Overall balance assessment: History of Falls;Modified Independent(History of daily falls for ~10 months. Author cannot r/o polypharmacy or mental health contributions.)                                           Pertinent Vitals/Pain Pain Assessment: ("not too bad"; has pain/(sciatic) pulling in posterior calf/popiteal space with active ankle plantar flexion and passive ankle DF bilat)    Home Living Family/patient expects to be discharged to:: Private residence Living Arrangements: Children(DTR, SIL, stepDTR) Available Help at Discharge: Family;Available PRN/intermittently Type of Home: House Home Access: Stairs to enter Entrance  Stairs-Rails: Can reach both Entrance Stairs-Number of Steps: 3-4 Home Layout: One level Home Equipment: Shower seat;Crutches;Other (comment);Walker - 4 wheels;Cane - single point Additional Comments: Per chart, pt unreliable historian. Tangential speech and perseverating behavior, combined with chronic anomia s/p expressive aphasia CVA in 2018.    Prior Function Level of Independence: Needs assistance   Gait / Transfers Assistance Needed: PT reports daily falls beginning in November 2019, was issued a 4WW in April 2020 when admitted, but pt noncompliant with device, using fixed objects in home for stabilization and "hurrycane" when out of house.  ADL's / Homemaking Assistance Needed: Pt states his family provides physical assist for functional mobility due to his poor balance and instability in his legs. Pt states he is independent with dressing, sponge bathing, and medication mgt. Family assists with driving and community mobility.        Hand Dominance   Dominant Hand: Right    Extremity/Trunk Assessment   Upper Extremity Assessment Upper Extremity Assessment: Overall WFL for tasks assessed(excellent use of BUE to support self on RW in AMB)    Lower Extremity Assessment Lower Extremity Assessment: RLE deficits/detail;LLE deficits/detail RLE Deficits / Details: 5/5 ankle DFlexion; ankle PFlexion not tested d/t pain and limited range; quads 4+/5 with giving way at end range, hamstrings 3+/5; hip flexion 4+/5 with giving  way; hip ABDCT 3/5. LLE Deficits / Details: 5/5 ankle DFlexion; ankle PFlexion not tested d/t pain and limited range; quads 4+/5 with giving way at end range, hamstrings 3+/5; hip flexion 4+/5 with giving way; hip ABDCT 3/5. LLE Coordination: decreased gross motor       Communication   Communication: Expressive difficulties  Cognition Arousal/Alertness: Awake/alert Behavior During Therapy: Impulsive;Restless Overall Cognitive Status: No family/caregiver present  to determine baseline cognitive functioning                                 General Comments: Tangential speech, perseverating on several topics, requires frequent redirection. Attends to task of AMB when performing AMB.      General Comments      Exercises     Assessment/Plan    PT Assessment Patient needs continued PT services  PT Problem List Decreased strength;Decreased range of motion;Decreased activity tolerance;Decreased mobility;Decreased balance;Decreased cognition;Decreased knowledge of use of DME;Impaired tone       PT Treatment Interventions DME instruction;Gait training;Stair training;Functional mobility training;Therapeutic activities;Therapeutic exercise;Patient/family education;Cognitive remediation;Neuromuscular re-education;Balance training    PT Goals (Current goals can be found in the Care Plan section)  Acute Rehab PT Goals Patient Stated Goal: To be able to get out in the community more and have fewer falls. PT Goal Formulation: With patient Time For Goal Achievement: 04/05/19 Potential to Achieve Goals: Fair    Frequency Min 2X/week   Barriers to discharge Inaccessible home environment(steps to enter, but has been maanging these some how.)      Co-evaluation               AM-PAC PT "6 Clicks" Mobility  Outcome Measure Help needed turning from your back to your side while in a flat bed without using bedrails?: A Little Help needed moving from lying on your back to sitting on the side of a flat bed without using bedrails?: A Little Help needed moving to and from a bed to a chair (including a wheelchair)?: A Little Help needed standing up from a chair using your arms (e.g., wheelchair or bedside chair)?: A Little Help needed to walk in hospital room?: A Little Help needed climbing 3-5 steps with a railing? : A Lot 6 Click Score: 17    End of Session Equipment Utilized During Treatment: Gait belt Activity Tolerance: Patient  tolerated treatment well;No increased pain Patient left: in bed;with call bell/phone within reach;with bed alarm set Nurse Communication: Mobility status PT Visit Diagnosis: Unsteadiness on feet (R26.81);Difficulty in walking, not elsewhere classified (R26.2);Other abnormalities of gait and mobility (R26.89);Repeated falls (R29.6)    Time: 3009-2330 PT Time Calculation (min) (ACUTE ONLY): 30 min   Charges:   PT Evaluation $PT Eval High Complexity: 1 High PT Treatments $Gait Training: 8-22 mins        4:08 PM, 03/22/19 Rosamaria Lints, PT, DPT Physical Therapist - [facility]  [phone] (ASCOM)    , C 03/22/2019, 3:59 PM

## 2019-03-22 NOTE — Progress Notes (Signed)
Patient ID: Carl [name], male   DOB: [birth year], 59 y.o.   MRN: [id number]  [facility] PROGRESS NOTE  Carl [name] AGT:[id number] DOB: 1960-11-08 DOA: 03/21/2019 PCP: [facility]  HPI/Subjective: Patient states that he had pains in the chest and was breathing really hard and his nebulizers did not help and his blood pressure was very high.  He states he wheezes all the time.  He states that he needs a wheelchair at home.  Objective: Vitals:   03/22/19 0819 03/22/19 1331  BP:    Pulse:    Resp:    Temp:    SpO2: 96% 98%    Intake/Output Summary (Last 24 hours) at 03/22/2019 1504 Last data filed at 03/22/2019 0900 Gross per 24 hour  Intake 240 ml  Output 1675 ml  Net -1435 ml   Filed Weights   03/21/19 2106  Weight: 80.3 kg    ROS: Review of Systems  Constitutional: Negative for chills and fever.  Eyes: Negative for blurred vision.  Respiratory: Positive for cough and shortness of breath.   Cardiovascular: Negative for chest pain.  Gastrointestinal: Negative for abdominal pain, constipation, diarrhea, nausea and vomiting.  Genitourinary: Negative for dysuria.  Musculoskeletal: Negative for joint pain.  Neurological: Negative for dizziness and headaches.   Exam: Physical Exam  Constitutional: He is oriented to person, place, and time.  HENT:  Nose: No mucosal edema.  Mouth/Throat: No oropharyngeal exudate or posterior oropharyngeal edema.  Eyes: Pupils are equal, round, and reactive to light. Conjunctivae, EOM and lids are normal.  Neck: No JVD present. Carotid bruit is not present. No edema present. No thyroid mass and no thyromegaly present.  Cardiovascular: S1 normal and S2 normal. Exam reveals no gallop.  No murmur heard. Pulses:      Dorsalis pedis pulses are 2+ on the right side and 2+ on the left side.  Respiratory: No respiratory distress. He has decreased breath sounds in the right lower field and the left lower field. He has wheezes in the  right middle field, the right lower field and the left lower field. He has no rhonchi. He has no rales.  GI: Soft. Bowel sounds are normal. There is no abdominal tenderness.  Musculoskeletal:     Right ankle: He exhibits swelling.     Left ankle: He exhibits swelling.  Lymphadenopathy:    He has no cervical adenopathy.  Neurological: He is alert and oriented to person, place, and time. No cranial nerve deficit.  Skin: Skin is warm. No rash noted. Nails show no clubbing.  Psychiatric: He has a normal mood and affect.      Data Reviewed: Basic Metabolic Panel: Recent Labs  Lab 03/21/19 2106  NA 137  K 3.8  CL 101  CO2 22  GLUCOSE 121*  BUN 25*  CREATININE 1.20  CALCIUM 10.1   Liver Function Tests: Recent Labs  Lab 03/21/19 2106  AST 45*  ALT 36  ALKPHOS 193*  BILITOT 0.6  PROT 8.1  ALBUMIN 4.2   CBC: Recent Labs  Lab 03/21/19 2106  WBC 8.4  NEUTROABS 5.2  HGB 12.4*  HCT 36.8*  MCV 88.2  PLT 290   BNP (last 3 results) Recent Labs    11/03/18 0151 03/21/19 2106  BNP >4,500.0* 766.0*      Recent Results (from the past 240 hour(s))  SARS Coronavirus 2 [facility] order, Performed in [facility] hospital lab) Nasopharyngeal Nasopharyngeal Swab     Status: None   Collection Time: 03/21/19  9:32 PM   Specimen: Nasopharyngeal Swab  Result Value Ref Range Status   SARS Coronavirus 2 NEGATIVE NEGATIVE Final    Comment: (NOTE) If result is NEGATIVE SARS-CoV-2 target nucleic acids are NOT DETECTED. The SARS-CoV-2 RNA is generally detectable in upper and lower  respiratory specimens during the acute phase of infection. The lowest  concentration of SARS-CoV-2 viral copies this assay can detect is 250  copies / mL. A negative result does not preclude SARS-CoV-2 infection  and should not be used as the sole basis for treatment or other  patient management decisions.  A negative result may occur with  improper specimen collection / handling, submission of  specimen other  than nasopharyngeal swab, presence of viral mutation(s) within the  areas targeted by this assay, and inadequate number of viral copies  (<250 copies / mL). A negative result must be combined with clinical  observations, patient history, and epidemiological information. If result is POSITIVE SARS-CoV-2 target nucleic acids are DETECTED. The SARS-CoV-2 RNA is generally detectable in upper and lower  respiratory specimens dur ing the acute phase of infection.  Positive  results are indicative of active infection with SARS-CoV-2.  Clinical  correlation with patient history and other diagnostic information is  necessary to determine patient infection status.  Positive results do  not rule out bacterial infection or co-infection with other viruses. If result is PRESUMPTIVE POSTIVE SARS-CoV-2 nucleic acids MAY BE PRESENT.   A presumptive positive result was obtained on the submitted specimen  and confirmed on repeat testing.  While 2019 novel coronavirus  (SARS-CoV-2) nucleic acids may be present in the submitted sample  additional confirmatory testing may be necessary for epidemiological  and / or clinical management purposes  to differentiate between  SARS-CoV-2 and other Sarbecovirus currently known to infect humans.  If clinically indicated additional testing with an alternate test  methodology [phone]) is advised. The SARS-CoV-2 RNA is generally  detectable in upper and lower respiratory sp ecimens during the acute  phase of infection. The expected result is Negative. Fact Sheet for Patients:  BoilerBrush.com.cyhttps://www.fda.gov/media/136312/download Fact Sheet for Healthcare Providers: https://pope.com/https://www.fda.gov/media/136313/download This test is not yet approved or cleared by the [place] FDA and has been authorized for detection and/or diagnosis of SARS-CoV-2 by FDA under an Emergency Use Authorization (EUA).  This EUA will remain in effect (meaning this test can be used) for the  duration of the COVID-19 declaration under Section 564(b)(1) of the Act, 21 U.S.C. section 360bbb-3(b)(1), unless the authorization is terminated or revoked sooner. Performed at [facility], [address] [place], [place] [address]      Studies: Dg Chest Portable 1 View  Result Date: 03/21/2019 CLINICAL DATA:  Respiratory distress EXAM: PORTABLE CHEST 1 VIEW COMPARISON:  Radiograph December 23, 2018 FINDINGS: Pacer/defibrillator pack overlies the left chest wall with leads in stable position of the right atrium and cardiac apex. Postsurgical changes related to prior CABG including intact and aligned sternotomy wires and multiple surgical clips projecting over the mediastinum. Prior open reduction internal fixation of the left clavicle, unchanged appearance from comparison. The heart is somewhat enlarged though this may be in part due to the portable technique streaky opacities present in the bases are most compatible with atelectasis. No convincing evidence of edema. No consolidative process. No pneumothorax or visible effusion. No acute osseous or soft tissue abnormality. IMPRESSION: Borderline cardiomegaly, similar to priors. Basilar atelectasis, no other acute cardiopulmonary abnormality Electronically Signed   By: Coralie KeensPrice  DeHay M.D.  On: 03/21/2019 21:37    Scheduled Meds: . aspirin EC  81 mg Oral Daily  . atorvastatin  80 mg Oral Daily  . budesonide (PULMICORT) nebulizer solution  0.5 mg Nebulization BID  . carvedilol  6.25 mg Oral BID  . enoxaparin (LOVENOX) injection  40 mg Subcutaneous Q24H  . furosemide  40 mg Oral BID  . ipratropium-albuterol  3 mL Nebulization Q6H  . levothyroxine  25 mcg Oral Daily  . lisinopril  2.5 mg Oral Daily  . predniSONE  40 mg Oral Q breakfast  . QUEtiapine  25 mg Oral TID  . vitamin B-12  1,000 mcg Oral Daily   Continuous Infusions:  Assessment/Plan:  1. COPD exacerbation on DuoNeb nebulizer solution and budesonide.  P.o. prednisone.   Evaluate tomorrow 2. Chronic systolic congestive heart failure.  Restart Lasix 40 mg p.o. twice daily continue Coreg and lisinopril.  Will consider low-dose Aldactone upon discharge. 3. Hypertension on Coreg and lisinopril 4. Weakness physical therapy and Occupational Therapy evaluation.  Patient requesting wheelchair. 5. Depression on Seroquel 6. History of stroke on aspirin and atorvastatin 7. Hypothyroidism on levothyroxine  Code Status:     Code Status Orders  (From admission, onward)         Start     Ordered   03/22/19 0125  Full code  Continuous     03/22/19 0124        Code Status History    Date Active Date Inactive Code Status Order ID Comments User Context   11/03/2018 0352 11/04/2018 2115 Full Code [id number]  Mansy, Arvella Merles, MD ED   Advance Care Planning Activity     Family Communication: Tried to call the patient's daughter but no answer at either number in the computer Disposition Plan: Reevaluate tomorrow for disposition  Consultants:  Patient requested to see speech pathology  Time spent: 68 minutes  [place]

## 2019-03-23 LAB — HIV ANTIBODY (ROUTINE TESTING W REFLEX): HIV Screen 4th Generation wRfx: NONREACTIVE

## 2019-03-23 MED ORDER — NICOTINE 14 MG/24HR TD PT24: MEDICATED_PATCH | TRANSDERMAL | 0 refills | Status: DC

## 2019-03-23 MED ORDER — POLYETHYLENE GLYCOL 3350 17 G PO PACK: 17.0000 g | PACK | Freq: Every day | ORAL | Status: DC

## 2019-03-23 MED ORDER — POLYETHYLENE GLYCOL 3350 17 G PO PACK
17.0000 g | PACK | Freq: Every day | ORAL | Status: DC
  Administered 2019-03-23: 17 g via ORAL
  Filled 2019-03-23: qty 1

## 2019-03-23 MED ORDER — POLYETHYLENE GLYCOL 3350 17 G PO PACK: 17.0000 g | PACK | Freq: Every day | ORAL | 0 refills | Status: DC | PRN

## 2019-03-23 MED ORDER — PREDNISONE 20 MG PO TABS: ORAL_TABLET | ORAL | 0 refills | Status: AC

## 2019-03-23 NOTE — Discharge Summary (Signed)
[facility] - [place] at [facility]   PATIENT NAME: Carl [name]    MR#:  [id number]  DATE OF BIRTH:  02-Mar-1961  DATE OF ADMISSION:  03/21/2019 ADMITTING PHYSICIAN: Delfino LovettVipul Shah, MD  DATE OF DISCHARGE: 03/23/2019  PRIMARY CARE PHYSICIAN: [facility]    ADMISSION DIAGNOSIS:  weakness  DISCHARGE DIAGNOSIS:  Active Problems:   COPD exacerbation (HCC)   SECONDARY DIAGNOSIS:   Past Medical History:  Diagnosis Date  . Acute pulmonary edema (HCC) 2017  . Acute respiratory failure with hypoxia (HCC) 2017  . Bipolar 1 disorder (HCC)   . COPD (chronic obstructive pulmonary disease) (HCC)   . Depression   . Hypertension   . Myocardial infarction (HCC)   . NSTEMI (non-ST elevated myocardial infarction) (HCC)   . 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 Seizures (HCC)    childhood  . Stroke (HCC)   . Tobacco abuse     HOSPITAL COURSE:   1.  COPD exacerbation on DuoNeb nebulizer solution and budesonide.  Patient is on p.o. prednisone.  The patient states that he always wheezes a little bit.  Patient moving better air today than yesterday.  Patient believes that he is ready to go home.  I advised that he needs to stop smoking.  Daughter states that she is not to give any more cigarettes.  Nicotine patch prescribed also. 2.  Chronic systolic congestive heart failure.  Continue Lasix 40 mg twice a day Coreg and lisinopril.  Patient's blood pressure too low this morning to start Aldactone upon discharge. 3.  Hypertension on Coreg and lisinopril low-dose. 4.  Weakness.  Appreciate physical therapy and Occupational Therapy consultations.  Patient requesting a wheelchair.  Outpatient PT. 5.  Depression on Seroquel 6.  History of stroke on aspirin and atorvastatin 7.  Hypothyroidism on levothyroxine 8.  Constipation start MiraLAX  DISCHARGE CONDITIONS:   Satisfactory  CONSULTS OBTAINED:  None  DRUG ALLERGIES:   Allergies   Allergen Reactions  . Iodinated Diagnostic Agents Shortness Of Breath  . Penicillins Anaphylaxis and Shortness Of Breath    Respiratory  Tolerated cefuroxime on 01/06/16  . Strawberry Extract Anaphylaxis  . Cefepime Itching    Empiric antibiotic, developed pruritis.   . Eggs Or Egg-Derived Products Itching    Medicine related, pt tolerates ingestion of eggs  . Erythromycin Itching  . Sulfa Antibiotics Itching, Nausea And Vomiting and Nausea Only    DISCHARGE MEDICATIONS:   Allergies as of 03/23/2019      Reactions   Iodinated Diagnostic Agents Shortness Of Breath   Penicillins Anaphylaxis, Shortness Of Breath   Respiratory  Tolerated cefuroxime on 01/06/16   Strawberry Extract Anaphylaxis   Cefepime Itching   Empiric antibiotic, developed pruritis.    Eggs Or Egg-derived Products Itching   Medicine related, pt tolerates ingestion of eggs   Erythromycin Itching   Sulfa Antibiotics Itching, Nausea And Vomiting, Nausea Only      Medication List    TAKE these medications   albuterol (2.5 MG/3ML) 0.083% nebulizer solution Commonly known as: PROVENTIL Take 2.5 mg by nebulization every 6 (six) hours as needed for wheezing or shortness of breath. Notes to patient: Not given during this hospitalization   albuterol 108 (90 Base) MCG/ACT inhaler Commonly known as: VENTOLIN HFA Inhale 2 puffs into the lungs every 6 (six) hours as needed for wheezing. Notes to patient: Not given during this hospitalization   aspirin EC 81 MG tablet Take 81 mg by mouth  daily.   atorvastatin 80 MG tablet Commonly known as: LIPITOR Take 1 tablet by mouth daily.   carvedilol 6.25 MG tablet Commonly known as: COREG Take 1 tablet by mouth 2 (two) times daily.   clopidogrel 75 MG tablet Commonly known as: PLAVIX Take 1 tablet by mouth daily.   divalproex 500 MG 24 hr tablet Commonly known as: DEPAKOTE ER Take 1 tablet by mouth 2 (two) times daily.   escitalopram 10 MG tablet Commonly known  as: LEXAPRO Take 1 tablet by mouth at bedtime.   furosemide 40 MG tablet Commonly known as: LASIX Take 1 tablet by mouth 2 (two) times daily.   levothyroxine 25 MCG tablet Commonly known as: SYNTHROID Take 1 tablet by mouth daily.   lisinopril 2.5 MG tablet Commonly known as: ZESTRIL Take 1 tablet by mouth daily.   Multi-Vitamin tablet Take 1 tablet by mouth daily.   nicotine 14 mg/24hr patch Commonly known as: NICODERM CQ - dosed in mg/24 hours 14 mg patch chest wall daily (may substitute generic)   polyethylene glycol 17 g packet Commonly known as: MIRALAX / GLYCOLAX Take 17 g by mouth daily as needed for moderate constipation.   predniSONE 20 MG tablet Commonly known as: DELTASONE 2 tabs po daily for three days Start taking on: March 24, 2019   QUEtiapine 25 MG tablet Commonly known as: SEROQUEL Take by mouth. Take 2 tablets (50 mg total) by mouth every morning, 2 tablets at lunch, and 3 tablets (75 mg total) nightly   Spiriva Respimat 2.5 MCG/ACT Aers Generic drug: Tiotropium Bromide Monohydrate Inhale 2 puffs into the lungs daily.   traZODone 100 MG tablet Commonly known as: DESYREL Take 1 tablet by mouth at bedtime.   vitamin B-12 1000 MCG tablet Commonly known as: CYANOCOBALAMIN Take 1 tablet by mouth daily.            Durable Medical Equipment  (From admission, onward)         Start     Ordered   03/22/19 1405  For home use only DME lightweight manual wheelchair with seat cushion  Once    Comments: Patient suffers from CVA, which impairs their ability to perform daily activities like  in the home.  A walking aid will not resolve  issue with performing activities of daily living. A wheelchair will allow patient to safely perform daily activities. Patient is not able to propel themselves in the home using a standard weight wheelchair due to weakness. Patient can self propel in the lightweight wheelchair. Length of need 99 months. Accessories:  elevating leg rests, back cushion, wheel locks, extensions and anti-tippers.   03/22/19 1407           DISCHARGE INSTRUCTIONS:   Follow-up PMD 5 days  If you experience worsening of your admission symptoms, develop shortness of breath, life threatening emergency, suicidal or homicidal thoughts you must seek medical attention immediately by calling 911 or calling your MD immediately  if symptoms less severe.  You Must read complete instructions/literature along with all the possible adverse reactions/side effects for all the Medicines you take and that have been prescribed to you. Take any new Medicines after you have completely understood and accept all the possible adverse reactions/side effects.   Please note  You were cared for by a hospitalist during your hospital stay. If you have any questions about your discharge medications or the care you received while you were in the hospital after you are discharged, you can call the  unit and asked to speak with the hospitalist on call if the hospitalist that took care of you is not available. Once you are discharged, your primary care physician will handle any further medical issues. Please note that NO REFILLS for any discharge medications will be authorized once you are discharged, as it is imperative that you return to your primary care physician (or establish a relationship with a primary care physician if you do not have one) for your aftercare needs so that they can reassess your need for medications and monitor your lab values.    Today   CHIEF COMPLAINT:   Chief Complaint  Patient presents with  . Weakness    HISTORY OF PRESENT ILLNESS:  Carl [name]  is a 58 y.o. male coming in with weakness and shortness of breath   VITAL SIGNS:  Blood pressure 105/84, pulse 70, temperature (!) 97.5 F (36.4 C), temperature source Oral, resp. rate 20, height 5\' 9"  (1.753 m), weight 80.3 kg, SpO2 97 %.   PHYSICAL EXAMINATION:  GENERAL:   58 y.o.-year-old patient lying in the bed with no acute distress.  EYES: Pupils equal, round, reactive to light and accommodation. No scleral icterus. Extraocular muscles intact.  HEENT: Head atraumatic, normocephalic. Oropharynx and nasopharynx clear.  NECK:  Supple, no jugular venous distention. No thyroid enlargement, no tenderness.  LUNGS: Decreased breath sounds bilaterally, slight expiratory wheezing at bases.  No rales,rhonchi or crepitation. No use of accessory muscles of respiration.  CARDIOVASCULAR: S1, S2 normal. No murmurs, rubs, or gallops.  ABDOMEN: Soft, non-tender, non-distended. Bowel sounds present. No organomegaly or mass.  EXTREMITIES: No pedal edema, cyanosis, or clubbing.  NEUROLOGIC: Cranial nerves II through XII are intact. Muscle strength 5/5 in all extremities. Sensation intact. Gait not checked.  PSYCHIATRIC: The patient is alert and oriented x 3.  SKIN: No obvious rash, lesion, or ulcer.   DATA REVIEW:   CBC Recent Labs  Lab 03/21/19 2106  WBC 8.4  HGB 12.4*  HCT 36.8*  PLT 290    Chemistries  Recent Labs  Lab 03/21/19 2106  NA 137  K 3.8  CL 101  CO2 22  GLUCOSE 121*  BUN 25*  CREATININE 1.20  CALCIUM 10.1  AST 45*  ALT 36  ALKPHOS 193*  BILITOT 0.6      Microbiology Results  Results for orders placed or performed during the hospital encounter of 03/21/19  SARS Coronavirus 2 [facility](Hospital order, Performed in [facility] hospital lab) Nasopharyngeal Nasopharyngeal Swab     Status: None   Collection Time: 03/21/19  9:32 PM   Specimen: Nasopharyngeal Swab  Result Value Ref Range Status   SARS Coronavirus 2 NEGATIVE NEGATIVE Final    Comment: (NOTE) If result is NEGATIVE SARS-CoV-2 target nucleic acids are NOT DETECTED. The SARS-CoV-2 RNA is generally detectable in upper and lower  respiratory specimens during the acute phase of infection. The lowest  concentration of SARS-CoV-2 viral copies this assay can detect is 250  copies / mL. A  negative result does not preclude SARS-CoV-2 infection  and should not be used as the sole basis for treatment or other  patient management decisions.  A negative result may occur with  improper specimen collection / handling, submission of specimen other  than nasopharyngeal swab, presence of viral mutation(s) within the  areas targeted by this assay, and inadequate number of viral copies  (<250 copies / mL). A negative result must be combined with clinical  observations, patient history, and epidemiological information. If  result is POSITIVE SARS-CoV-2 target nucleic acids are DETECTED. The SARS-CoV-2 RNA is generally detectable in upper and lower  respiratory specimens dur ing the acute phase of infection.  Positive  results are indicative of active infection with SARS-CoV-2.  Clinical  correlation with patient history and other diagnostic information is  necessary to determine patient infection status.  Positive results do  not rule out bacterial infection or co-infection with other viruses. If result is PRESUMPTIVE POSTIVE SARS-CoV-2 nucleic acids MAY BE PRESENT.   A presumptive positive result was obtained on the submitted specimen  and confirmed on repeat testing.  While 2019 novel coronavirus  (SARS-CoV-2) nucleic acids may be present in the submitted sample  additional confirmatory testing may be necessary for epidemiological  and / or clinical management purposes  to differentiate between  SARS-CoV-2 and other Sarbecovirus currently known to infect humans.  If clinically indicated additional testing with an alternate test  methodology [phone]) is advised. The SARS-CoV-2 RNA is generally  detectable in upper and lower respiratory sp ecimens during the acute  phase of infection. The expected result is Negative. Fact Sheet for Patients:  StrictlyIdeas.no Fact Sheet for Healthcare Providers: BankingDealers.co.za This test is not  yet approved or cleared by the [place] FDA and has been authorized for detection and/or diagnosis of SARS-CoV-2 by FDA under an Emergency Use Authorization (EUA).  This EUA will remain in effect (meaning this test can be used) for the duration of the COVID-19 declaration under Section 564(b)(1) of the Act, 21 U.S.C. section 360bbb-3(b)(1), unless the authorization is terminated or revoked sooner. Performed at [facility], [address] [place], [place] [address]     RADIOLOGY:  Dg Chest Portable 1 View  Result Date: 03/21/2019 CLINICAL DATA:  Respiratory distress EXAM: PORTABLE CHEST 1 VIEW COMPARISON:  Radiograph December 23, 2018 FINDINGS: Pacer/defibrillator pack overlies the left chest wall with leads in stable position of the right atrium and cardiac apex. Postsurgical changes related to prior CABG including intact and aligned sternotomy wires and multiple surgical clips projecting over the mediastinum. Prior open reduction internal fixation of the left clavicle, unchanged appearance from comparison. The heart is somewhat enlarged though this may be in part due to the portable technique streaky opacities present in the bases are most compatible with atelectasis. No convincing evidence of edema. No consolidative process. No pneumothorax or visible effusion. No acute osseous or soft tissue abnormality. IMPRESSION: Borderline cardiomegaly, similar to priors. Basilar atelectasis, no other acute cardiopulmonary abnormality Electronically Signed   By: Lovena Le M.D.   On: 03/21/2019 21:37     Management plans discussed with the patient, family (patient's daughter at [phone] and they are in agreement.  CODE STATUS:     Code Status Orders  (From admission, onward)         Start     Ordered   03/22/19 0125  Full code  Continuous     03/22/19 0124        Code Status History    Date Active Date Inactive Code Status Order ID Comments User Context   11/03/2018 0352  11/04/2018 2115 Full Code [id number]  Mansy, Arvella Merles, MD ED   Advance Care Planning Activity      TOTAL TIME TAKING CARE OF THIS PATIENT: 35 minutes.    Loletha Grayer M.D on 03/23/2019 at 1:25 PM  Between 7am to 6pm - Pager - [phone]  After 6pm go to [email or web address] - Clinical cytogeneticist  [phone]  CC: Primary care physician; [facility]

## 2019-03-23 NOTE — Plan of Care (Signed)

## 2019-03-23 NOTE — TOC Transition Note (Signed)
Transition of Care [facility]) - CM/SW Discharge Note   Patient Details  Name: Carl [name] MRN: [id number] Date of Birth: 1961-01-24  Transition of Care [facility]) CM/SW Contact:  Su Hilt, RN Phone Number: 03/23/2019, 12:12 PM   Clinical Narrative:    Spoke with the patient and let home know per Leroy Sea with [employer] the Wheelchair he needs will be delivered to his home today, he stated that his daughter would be picking him up to take him home.  He has a RW at home that he can use until the [facility] gets there. I encouraged him to not be moving around a lot once he gets home until he gets that wheelchair delivered.  He stated understanding and agreed   Final next level of care: OP Rehab Barriers to Discharge: Barriers Resolved   Patient Goals and CMS Choice Patient states their goals for this hospitalization and ongoing recovery are:: To go home.      Discharge Placement                       Discharge Plan and Services In-house Referral: Clinical Social Work Discharge Planning Services: CM Consult Post Acute Care Choice: Home Health(Outpatient rehab.)          DME Arranged: Youth worker wheelchair with seat cushion DME Agency: [employer] Date DME Agency Contacted: 03/22/19 Time DME Agency Contacted: 44 Representative spoke with at DME Agency: Leroy Sea            Social Determinants of Health ([place]) Interventions     Readmission Risk Interventions Readmission Risk Prevention Plan 11/03/2018  Transportation Screening Complete  PCP or Specialist Appt within 5-7 Days Not Complete  Not Complete comments still in ICU  Home Care Screening Complete  Medication Review (RN CM) Complete

## 2019-03-23 NOTE — Progress Notes (Signed)
Pt is being discharged to home. Pt is Aox4, VSS, pt does not show any signs of distress. AVS/RX was given and explained to the pt and he verbalized understanding of all information.    

## 2019-05-06 DIAGNOSIS — N5089 Other specified disorders of the male genital organs: Secondary | ICD-10-CM | POA: Insufficient documentation

## 2019-05-06 DIAGNOSIS — R8290 Unspecified abnormal findings in urine: Secondary | ICD-10-CM | POA: Insufficient documentation

## 2019-05-06 DIAGNOSIS — K5900 Constipation, unspecified: Secondary | ICD-10-CM | POA: Insufficient documentation

## 2019-05-13 ENCOUNTER — Other Ambulatory Visit: Payer: Self-pay

## 2019-05-13 ENCOUNTER — Encounter: Payer: Self-pay | Admitting: Emergency Medicine

## 2019-05-13 DIAGNOSIS — Z7982 Long term (current) use of aspirin: Secondary | ICD-10-CM | POA: Diagnosis not present

## 2019-05-13 DIAGNOSIS — J449 Chronic obstructive pulmonary disease, unspecified: Secondary | ICD-10-CM | POA: Diagnosis not present

## 2019-05-13 DIAGNOSIS — Z79899 Other long term (current) drug therapy: Secondary | ICD-10-CM | POA: Diagnosis not present

## 2019-05-13 DIAGNOSIS — Z7902 Long term (current) use of antithrombotics/antiplatelets: Secondary | ICD-10-CM | POA: Diagnosis not present

## 2019-05-13 DIAGNOSIS — I252 Old myocardial infarction: Secondary | ICD-10-CM | POA: Insufficient documentation

## 2019-05-13 DIAGNOSIS — Z20828 Contact with and (suspected) exposure to other viral communicable diseases: Secondary | ICD-10-CM | POA: Insufficient documentation

## 2019-05-13 DIAGNOSIS — F332 Major depressive disorder, recurrent severe without psychotic features: Secondary | ICD-10-CM | POA: Diagnosis not present

## 2019-05-13 DIAGNOSIS — Z8673 Personal history of transient ischemic attack (TIA), and cerebral infarction without residual deficits: Secondary | ICD-10-CM | POA: Diagnosis not present

## 2019-05-13 DIAGNOSIS — F17210 Nicotine dependence, cigarettes, uncomplicated: Secondary | ICD-10-CM | POA: Diagnosis not present

## 2019-05-13 DIAGNOSIS — I10 Essential (primary) hypertension: Secondary | ICD-10-CM | POA: Diagnosis not present

## 2019-05-13 LAB — COMPREHENSIVE METABOLIC PANEL
ALT: 23 U/L (ref 0–44)
AST: 27 U/L (ref 15–41)
Albumin: 4.5 g/dL (ref 3.5–5.0)
Alkaline Phosphatase: 82 U/L (ref 38–126)
Anion gap: 12 (ref 5–15)
BUN: 18 mg/dL (ref 6–20)
CO2: 25 mmol/L (ref 22–32)
Calcium: 10.4 mg/dL — ABNORMAL HIGH (ref 8.9–10.3)
Chloride: 105 mmol/L (ref 98–111)
Creatinine, Ser: 1.04 mg/dL (ref 0.61–1.24)
GFR calc Af Amer: >60 mL/min (ref >60)
GFR calc non Af Amer: >60 mL/min (ref >60)
Glucose, Bld: 97 mg/dL (ref 70–99)
Potassium: 3.8 mmol/L (ref 3.5–5.1)
Sodium: 142 mmol/L (ref 135–145)
Total Bilirubin: 1 mg/dL (ref 0.3–1.2)
Total Protein: 8.1 g/dL (ref 6.5–8.1)

## 2019-05-13 LAB — BLOOD GAS, VENOUS
Acid-Base Excess: 0.8 mmol/L (ref 0.0–2.0)
Bicarbonate: 24.2 mmol/L (ref 20.0–28.0)
O2 Saturation: 85.1 %
Patient temperature: 37
pCO2, Ven: 34 mmHg — ABNORMAL LOW (ref 44.0–60.0)
pH, Ven: 7.46 — ABNORMAL HIGH (ref 7.250–7.430)
pO2, Ven: 47 mmHg — ABNORMAL HIGH (ref 32.0–45.0)

## 2019-05-13 LAB — CBC
HCT: 37.2 % — ABNORMAL LOW (ref 39.0–52.0)
Hemoglobin: 12.5 g/dL — ABNORMAL LOW (ref 13.0–17.0)
MCH: 30.3 pg (ref 26.0–34.0)
MCHC: 33.6 g/dL (ref 30.0–36.0)
MCV: 90.1 fL (ref 80.0–100.0)
Platelets: 257 10*3/uL (ref 150–400)
RBC: 4.13 MIL/uL — ABNORMAL LOW (ref 4.22–5.81)
RDW: 14.9 % (ref 11.5–15.5)
WBC: 7.4 10*3/uL (ref 4.0–10.5)
nRBC: 0 % (ref 0.0–0.2)

## 2019-05-13 LAB — ETHANOL: Alcohol, Ethyl (B): <10 mg/dL (ref <10)
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
## 2019-05-14 ENCOUNTER — Other Ambulatory Visit: Payer: Self-pay

## 2019-05-14 ENCOUNTER — Emergency Department
Admission: EM | Admit: 2019-05-14 | Discharge: 2019-05-14 | Disposition: A | Discharge status: Other Acute Inpatient Hospital | Payer: Medicaid Other | Attending: Emergency Medicine | Admitting: Emergency Medicine

## 2019-05-14 ENCOUNTER — Inpatient Hospital Stay
Admission: RE | Admit: 2019-05-14 | Discharge: 2019-05-24 | DRG: 885 | Disposition: A | Discharge status: Home / Self Care | Payer: Medicaid Other | Source: Intra-hospital | Attending: Psychiatry | Admitting: Psychiatry

## 2019-05-14 DIAGNOSIS — E039 Hypothyroidism, unspecified: Secondary | ICD-10-CM | POA: Diagnosis present

## 2019-05-14 DIAGNOSIS — I2510 Atherosclerotic heart disease of native coronary artery without angina pectoris: Secondary | ICD-10-CM

## 2019-05-14 DIAGNOSIS — I25118 Atherosclerotic heart disease of native coronary artery with other forms of angina pectoris: Secondary | ICD-10-CM | POA: Diagnosis present

## 2019-05-14 DIAGNOSIS — I10 Essential (primary) hypertension: Secondary | ICD-10-CM | POA: Diagnosis present

## 2019-05-14 DIAGNOSIS — F32A Depression, unspecified: Secondary | ICD-10-CM

## 2019-05-14 DIAGNOSIS — Z8673 Personal history of transient ischemic attack (TIA), and cerebral infarction without residual deficits: Secondary | ICD-10-CM | POA: Diagnosis not present

## 2019-05-14 DIAGNOSIS — Z79899 Other long term (current) drug therapy: Secondary | ICD-10-CM | POA: Diagnosis not present

## 2019-05-14 DIAGNOSIS — F411 Generalized anxiety disorder: Secondary | ICD-10-CM | POA: Diagnosis present

## 2019-05-14 DIAGNOSIS — Z7689 Persons encountering health services in other specified circumstances: Secondary | ICD-10-CM

## 2019-05-14 DIAGNOSIS — F329 Major depressive disorder, single episode, unspecified: Secondary | ICD-10-CM

## 2019-05-14 DIAGNOSIS — F319 Bipolar disorder, unspecified: Secondary | ICD-10-CM | POA: Diagnosis present

## 2019-05-14 DIAGNOSIS — F332 Major depressive disorder, recurrent severe without psychotic features: Secondary | ICD-10-CM | POA: Diagnosis not present

## 2019-05-14 DIAGNOSIS — Z87891 Personal history of nicotine dependence: Secondary | ICD-10-CM

## 2019-05-14 DIAGNOSIS — J449 Chronic obstructive pulmonary disease, unspecified: Secondary | ICD-10-CM | POA: Diagnosis present

## 2019-05-14 DIAGNOSIS — R45850 Homicidal ideations: Secondary | ICD-10-CM | POA: Diagnosis present

## 2019-05-14 DIAGNOSIS — R7611 Nonspecific reaction to tuberculin skin test without active tuberculosis: Secondary | ICD-10-CM

## 2019-05-14 DIAGNOSIS — I959 Hypotension, unspecified: Secondary | ICD-10-CM

## 2019-05-14 LAB — ACETAMINOPHEN LEVEL: Acetaminophen (Tylenol), Serum: <10 ug/mL — ABNORMAL LOW (ref 10–30)

## 2019-05-14 LAB — SALICYLATE LEVEL: Salicylate Lvl: <7 mg/dL (ref 2.8–30.0)

## 2019-05-14 LAB — SARS CORONAVIRUS 2 (TAT 6-24 HRS): SARS Coronavirus 2: NEGATIVE

## 2019-05-14 MED ORDER — ALBUTEROL SULFATE HFA 108 (90 BASE) MCG/ACT IN AERS
2.0000 | INHALATION_SPRAY | Freq: Four times a day (QID) | RESPIRATORY_TRACT | Status: DC | PRN
  Filled 2019-05-14: qty 6.7

## 2019-05-14 MED ORDER — ESCITALOPRAM OXALATE 10 MG PO TABS: 10.0000 mg | ORAL_TABLET | Freq: Every day | ORAL | Status: DC

## 2019-05-14 MED ORDER — LEVOTHYROXINE SODIUM 50 MCG PO TABS
25.0000 ug | ORAL_TABLET | Freq: Every day | ORAL | Status: DC
  Administered 2019-05-15 – 2019-05-24 (×10): 25 ug via ORAL
  Filled 2019-05-14 (×9): qty 1

## 2019-05-14 MED ORDER — ALBUTEROL SULFATE (2.5 MG/3ML) 0.083% IN NEBU: 2.5000 mg | INHALATION_SOLUTION | Freq: Four times a day (QID) | RESPIRATORY_TRACT | Status: DC | PRN

## 2019-05-14 MED ORDER — QUETIAPINE FUMARATE 25 MG PO TABS: 75.0000 mg | ORAL_TABLET | Freq: Every day | ORAL | Status: DC

## 2019-05-14 MED ORDER — LISINOPRIL 5 MG PO TABS
2.5000 mg | ORAL_TABLET | Freq: Every day | ORAL | Status: DC
  Administered 2019-05-14: 09:00:00 2.5 mg via ORAL
  Filled 2019-05-14: qty 1

## 2019-05-14 MED ORDER — LISINOPRIL 5 MG PO TABS
2.5000 mg | ORAL_TABLET | Freq: Every day | ORAL | Status: DC
  Administered 2019-05-15 – 2019-05-24 (×10): 2.5 mg via ORAL
  Filled 2019-05-14 (×10): qty 0.5

## 2019-05-14 MED ORDER — CLOPIDOGREL BISULFATE 75 MG PO TABS
75.0000 mg | ORAL_TABLET | Freq: Every day | ORAL | Status: DC
  Administered 2019-05-14: 09:00:00 75 mg via ORAL
  Filled 2019-05-14: qty 1

## 2019-05-14 MED ORDER — LEVOTHYROXINE SODIUM 25 MCG PO TABS
25.0000 ug | ORAL_TABLET | Freq: Every day | ORAL | Status: DC
  Administered 2019-05-14: 09:00:00 25 ug via ORAL
  Filled 2019-05-14: qty 1

## 2019-05-14 MED ORDER — CARVEDILOL 6.25 MG PO TABS
6.2500 mg | ORAL_TABLET | Freq: Two times a day (BID) | ORAL | Status: DC
  Administered 2019-05-15 – 2019-05-24 (×18): 6.25 mg via ORAL
  Filled 2019-05-14 (×19): qty 1

## 2019-05-14 MED ORDER — CLOPIDOGREL BISULFATE 75 MG PO TABS
75.0000 mg | ORAL_TABLET | Freq: Every day | ORAL | Status: DC
  Administered 2019-05-15 – 2019-05-24 (×10): 75 mg via ORAL
  Filled 2019-05-14 (×10): qty 1

## 2019-05-14 MED ORDER — ASPIRIN EC 81 MG PO TBEC
81.0000 mg | DELAYED_RELEASE_TABLET | Freq: Every day | ORAL | Status: DC
  Administered 2019-05-15 – 2019-05-24 (×10): 81 mg via ORAL
  Filled 2019-05-14 (×10): qty 1

## 2019-05-14 MED ORDER — TRAZODONE HCL 100 MG PO TABS: 100.0000 mg | ORAL_TABLET | Freq: Every day | ORAL | Status: DC

## 2019-05-14 MED ORDER — QUETIAPINE FUMARATE 25 MG PO TABS
50.0000 mg | ORAL_TABLET | ORAL | Status: DC
  Administered 2019-05-14: 09:00:00 50 mg via ORAL
  Filled 2019-05-14: qty 2

## 2019-05-14 MED ORDER — ALUM & MAG HYDROXIDE-SIMETH 200-200-20 MG/5ML PO SUSP: 30.0000 mL | ORAL | Status: DC | PRN

## 2019-05-14 MED ORDER — ACETAMINOPHEN 325 MG PO TABS
650.0000 mg | ORAL_TABLET | Freq: Four times a day (QID) | ORAL | Status: DC | PRN
  Administered 2019-05-15 – 2019-05-23 (×5): 650 mg via ORAL
  Filled 2019-05-14 (×5): qty 2

## 2019-05-14 MED ORDER — QUETIAPINE FUMARATE 25 MG PO TABS
50.0000 mg | ORAL_TABLET | Freq: Every day | ORAL | Status: DC
  Administered 2019-05-14: 50 mg via ORAL
  Filled 2019-05-14: qty 2

## 2019-05-14 MED ORDER — CARVEDILOL 6.25 MG PO TABS
6.2500 mg | ORAL_TABLET | Freq: Two times a day (BID) | ORAL | Status: DC
  Administered 2019-05-14: 09:00:00 6.25 mg via ORAL
  Filled 2019-05-14 (×2): qty 1

## 2019-05-14 MED ORDER — ESCITALOPRAM OXALATE 10 MG PO TABS
10.0000 mg | ORAL_TABLET | Freq: Every day | ORAL | Status: DC
  Administered 2019-05-15 – 2019-05-23 (×9): 10 mg via ORAL
  Filled 2019-05-14 (×9): qty 1

## 2019-05-14 MED ORDER — QUETIAPINE FUMARATE 100 MG PO TABS
50.0000 mg | ORAL_TABLET | Freq: Every day | ORAL | Status: DC
  Administered 2019-05-15 – 2019-05-23 (×9): 50 mg via ORAL
  Filled 2019-05-14 (×5): qty 1
  Filled 2019-05-14: qty 2
  Filled 2019-05-14: qty 1
  Filled 2019-05-14 (×2): qty 2

## 2019-05-14 MED ORDER — DIVALPROEX SODIUM ER 500 MG PO TB24
500.0000 mg | ORAL_TABLET | Freq: Two times a day (BID) | ORAL | Status: DC
  Administered 2019-05-15 – 2019-05-24 (×19): 500 mg via ORAL
  Filled 2019-05-14 (×19): qty 1

## 2019-05-14 MED ORDER — DIVALPROEX SODIUM ER 500 MG PO TB24
500.0000 mg | ORAL_TABLET | Freq: Two times a day (BID) | ORAL | Status: DC
  Administered 2019-05-14: 09:00:00 500 mg via ORAL
  Filled 2019-05-14: qty 2

## 2019-05-14 MED ORDER — MAGNESIUM HYDROXIDE 400 MG/5ML PO SUSP: 30.0000 mL | Freq: Every day | ORAL | Status: DC | PRN

## 2019-05-14 MED ORDER — QUETIAPINE FUMARATE 100 MG PO TABS
50.0000 mg | ORAL_TABLET | ORAL | Status: DC
  Administered 2019-05-15 – 2019-05-24 (×10): 50 mg via ORAL
  Filled 2019-05-14 (×2): qty 2
  Filled 2019-05-14 (×4): qty 1
  Filled 2019-05-14 (×3): qty 2
  Filled 2019-05-14 (×2): qty 1

## 2019-05-14 MED ORDER — ASPIRIN EC 81 MG PO TBEC
81.0000 mg | DELAYED_RELEASE_TABLET | Freq: Every day | ORAL | Status: DC
  Administered 2019-05-14: 09:00:00 81 mg via ORAL
  Filled 2019-05-14: qty 1

## 2019-05-14 NOTE — ED Notes (Signed)
Given Meal [employer]. 
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
## 2019-05-14 NOTE — ED Notes (Signed)
This RN and Alissa NT changed patient into hospital provided clothing. Patient's belongings placed into labeled bag.  Patient's belonging's include: 1 pair boots, 1 pair jeans, 1 belt, 1 pair grey pants, 1 shirt, 1 jacket, 1 pair underwear, 1 pair socks.

## 2019-05-14 NOTE — ED Notes (Signed)
Meal tray given 

## 2019-05-14 NOTE — ED Notes (Signed)
Report given to Wendy, RN.
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
## 2019-05-14 NOTE — ED Notes (Signed)
Breakfast tray was given with juice. 

## 2019-05-14 NOTE — ED Notes (Signed)
[facility] is involved in pt's care.  Pt is currently homeless.  APS would like to be made aware of any updates while he is in the hospital [phone].

## 2019-05-14 NOTE — ED Provider Notes (Signed)
-----------------------------------------   11:36 AM on 05/14/2019 -----------------------------------------  Patient has been seen and evaluated by psychiatry they will be admitting to their service once a bed becomes available.  Medical work-up largely nonrevealing.   Harvest Dark, MD 05/14/19 1136

## 2019-05-14 NOTE — ED Notes (Signed)
[facility]/PENDING PSYCH CONSULT 

## 2019-05-14 NOTE — ED Notes (Signed)
Patient ate 100% of supper and beverage.  
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
## 2019-05-15 DIAGNOSIS — Z8673 Personal history of transient ischemic attack (TIA), and cerebral infarction without residual deficits: Secondary | ICD-10-CM

## 2019-05-15 DIAGNOSIS — E039 Hypothyroidism, unspecified: Secondary | ICD-10-CM

## 2019-05-15 DIAGNOSIS — I959 Hypotension, unspecified: Secondary | ICD-10-CM

## 2019-05-15 DIAGNOSIS — I2510 Atherosclerotic heart disease of native coronary artery without angina pectoris: Secondary | ICD-10-CM

## 2019-05-15 DIAGNOSIS — I10 Essential (primary) hypertension: Secondary | ICD-10-CM

## 2019-05-15 DIAGNOSIS — R45850 Homicidal ideations: Secondary | ICD-10-CM

## 2019-05-15 LAB — LIPID PANEL
Cholesterol: 192 mg/dL (ref 0–200)
HDL: 46 mg/dL (ref >40)
LDL Cholesterol: 90 mg/dL (ref 0–99)
Total CHOL/HDL Ratio: 4.2 RATIO
Triglycerides: 281 mg/dL — ABNORMAL HIGH (ref <150)
VLDL: 56 mg/dL — ABNORMAL HIGH (ref 0–40)

## 2019-05-15 LAB — HEPATIC FUNCTION PANEL
ALT: 21 U/L (ref 0–44)
AST: 29 U/L (ref 15–41)
Albumin: 4.5 g/dL (ref 3.5–5.0)
Alkaline Phosphatase: 92 U/L (ref 38–126)
Bilirubin, Direct: <0.1 mg/dL (ref 0.0–0.2)
Total Bilirubin: 0.8 mg/dL (ref 0.3–1.2)
Total Protein: 7.9 g/dL (ref 6.5–8.1)

## 2019-05-15 LAB — TSH: TSH: 9.124 u[IU]/mL — ABNORMAL HIGH (ref 0.350–4.500)

## 2019-05-15 MED ORDER — INFLUENZA VAC SPLIT QUAD 0.5 ML IM SUSY: 0.5000 mL | PREFILLED_SYRINGE | INTRAMUSCULAR | Status: DC

## 2019-05-15 MED ORDER — QUETIAPINE FUMARATE 100 MG PO TABS
100.0000 mg | ORAL_TABLET | Freq: Every day | ORAL | Status: DC
  Administered 2019-05-15 – 2019-05-23 (×9): 100 mg via ORAL
  Filled 2019-05-15 (×9): qty 1
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
## 2019-05-15 NOTE — Plan of Care (Signed)
Encourage patient participation with unit programing , encourage verbalization of feeling  in therapy groups. Patient knowledgeable of information received  concerning medication.   Voice no concerns around sleep and wake cycle .  Patient working on  Estate agent , and anxiety. Patient  denies suicidal ideations  Problem: [facility] Concurrent Medical Problem Goal: LTG-Pt will be physically stable and he/significant other Description: (Patient will be physically stable and he/significant other will be able to verbalize understanding of follow-up care and symptoms that would warrant further treatment) Outcome: Progressing Goal: STG-Vital signs will be within defined limits or stabilized Description: (STG- Vital signs will be within defined limits or stabilized for individual) Outcome: Progressing Goal: STG-Compliance with medication and/or treatment as ordered Description: (STG-Compliance with medication and/or treatment as ordered by MD) Outcome: Progressing Goal: STG-Verbalize two symptoms that would warrant further Description: (STG-Verbalize two symptoms that would warrant further treatment) Outcome: Progressing Goal: STG-Patient will participate in management/stabilization Description: (STG-Patient will participate in management/stabilization of medical condition) Outcome: Progressing Goal: STG-Other (Specify): Description: STG-Other Concurrent Medical (Specify): Outcome: Progressing   Problem: Self-Concept: Goal: Will verbalize positive feelings about self Outcome: Progressing Goal: Level of anxiety will decrease Outcome: Progressing   Problem: Consults Goal: Concurrent Medical Patient Education Description: (See Patient Education Module for education specifics) Outcome: Progressing   Problem: Safety: Goal: Ability to disclose and discuss suicidal ideas will improve Outcome: Progressing Goal: Ability to identify and utilize support systems that promote safety will  improve Outcome: Progressing   Problem: Role Relationship: Goal: Will demonstrate positive changes in social behaviors and relationships Outcome: Progressing   Problem: Health Behavior/Discharge Planning: Goal: Ability to make decisions will improve Outcome: Progressing Goal: Compliance with therapeutic regimen will improve Outcome: Progressing   Problem: Coping: Goal: Coping ability will improve Outcome: Progressing Goal: Will verbalize feelings Outcome: Progressing   Problem: Activity: Goal: Interest or engagement in leisure activities will improve Outcome: Progressing Goal: Imbalance in normal sleep/wake cycle will improve Outcome: Progressing   Problem: Education: Goal: Utilization of techniques to improve thought processes will improve Outcome: Progressing Goal: Knowledge of the prescribed therapeutic regimen will improve Outcome: Progressing

## 2019-05-15 NOTE — [facility] Group Notes (Addendum)
LCSW Group Therapy 05/15/2019 1:00pm  Type of Therapy and Topic:  Group Therapy:  Setting Goals  Participation Level:  Active  Description of Group: In this process group, patients discussed using strengths to work toward goals and address challenges.  Patients identified two positive things about themselves and one goal they were working on.  Patients were given the opportunity to share openly and support each other's plan for self-empowerment.  The group discussed the value of gratitude and were encouraged to have a daily reflection of positive characteristics or circumstances.  Patients were encouraged to identify a plan to utilize their strengths to work on current challenges and goals.  Therapeutic Goals 1. Patient will verbalize personal strengths/positive qualities and relate how these can assist with achieving desired personal goals 2. Patients will verbalize affirmation of peers plans for personal change and goal setting 3. Patients will explore the value of gratitude and positive focus as related to successful achievement of goals 4. Patients will verbalize a plan for regular reinforcement of personal positive qualities and circumstances.  Summary of Patient Progress: Patient identified the definition of goals.Patients was given the opportunity to share openly and support other group members' plan for self-empowerment. Patient verbalized personal strength and how they relate to achieving the desired goal. Patient was able to identify positive goals to work towards when she returns home. Patient participated in group discussion. During check-ins, patient identified feeling "calm because I have been lying in bed all day trying to remain calm and not think about the things my daughter did." Patient discussed goals and identified what makes him happy. He participated in discussion of SMART goals. He discussed his goal of working with APS to find somewhere else to live. Patients were given a  SMART goal worksheet for them to write down their goals and verify that their goal is SMART.    Therapeutic Modalities Cognitive Behavioral Therapy Motivational Interviewing    Netta Neat, [place]
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
## 2019-05-15 NOTE — H&P (Signed)
Psychiatric Admission Assessment Adult  Patient Identification: Carl [name] MRN:  [id number] Date of Evaluation:  05/15/2019 Chief Complaint:  major depressive disorder Principal Diagnosis: Bipolar 1 disorder (HCC) Diagnosis:  Principal Problem:   Bipolar 1 disorder (HCC) Active Problems:   Coronary artery disease   Essential hypertension   History of stroke   Homicidal ideation   Hypothyroidism  History of Present Illness: Patient seen chart reviewed.  This is a gentleman with a history of chronic mood and behavior problems who was brought in under IVC filed by his daughter alleging that he had made homicidal threats.  The patient himself is an imperfect historian.  He has difficulty answering even the simplest yes or no questions without becoming tangential and disorganized.  Tends to ramble and get overly emotional.  Patient admits that he stated that he would kill his daughter Carl [name].  His rationale for this is convoluted and involves a long history of grievances.  Patient admits that he had been off of his medication for a while recently but he is unable to quantify it.  It is also unclear to me whether he had gotten back on his medicine properly prior to hospitalization or not.  Evidently there had been practical reasons somewhat related to the coronavirus that he had fallen out of treatment with his [facility] providers [facility].  Patient denies recent alcohol or drug abuse.  He denies hallucinations.  He denies suicidal thoughts.  Apparently Adult Protective Services is already involved in the case although exactly what they are take on it and what their plan is is not stated.  Patient is able to tell me that he needs to be on his Depakote and Seroquel.  When asked to describe the events leading up to this argument with his daughter he just rambles about a variety of complaints and cannot tell a coherent story.  Apparently his wife died of cancer a little over a year ago it sounds like since that  time he has been staying with his daughter and the friction has been pretty continuous. Associated Signs/Symptoms: Depression Symptoms:  difficulty concentrating, hopelessness, anxiety, disturbed sleep, (Hypo) Manic Symptoms:  Irritable Mood, Anxiety Symptoms:  Nothing specific Psychotic Symptoms:  His complaints are wide ranging.  I am not sure if any of that is delusional or not.  For example he accuses his son-in-law being a pedophile.  I have no idea whether that is delusional just exaggerated or what.  He denies hallucinations and does not appear to be responding to hallucinations. PTSD Symptoms: Negative Total Time spent with patient: 1 hour  Past Psychiatric History: I can only piece things together through some of the old notes from [facility].  Most of those old notes, as is typical for electronic medical records, contain a great deal of useless boiler plate and only scraps of useful clinical information.  It does sound however like this patient has had problems with mood and behavior going back to childhood.  Had seizures at least in childhood.  Has had several strokes.  Patient tells me he has never been in a psychiatric hospital before but I think that is probably untrue since it looks like the last time he was in our emergency room we referred him to geriatric psychiatry.  He denies ever having tried to kill himself in the past.  It sounds like anger problems and threats of violence are a common part of his issue.  Most common medicines I see over the course of the last  few years are Depakote and Seroquel.  Medication management somewhat limited I assume by his heart disease and his long QT interval.  Diagnosis used to be major depression recurrent but more recently changed to bipolar.  Is the patient at risk to self? Yes.    Has the patient been a risk to self in the past 6 months? Yes.    Has the patient been a risk to self within the distant past? Yes.    Is the patient a risk to others?  Yes.    Has the patient been a risk to others in the past 6 months? Yes.    Has the patient been a risk to others within the distant past? Yes.     Prior Inpatient Therapy:   Prior Outpatient Therapy:    Alcohol Screening: 1. How often do you have a drink containing alcohol?: Never 2. How many drinks containing alcohol do you have on a typical day when you are drinking?: 1 or 2 3. How often do you have six or more drinks on one occasion?: Never AUDIT-C Score: 0 4. How often during the last year have you found that you were not able to stop drinking once you had started?: Never 5. How often during the last year have you failed to do what was normally expected from you becasue of drinking?: Never 6. How often during the last year have you needed a first drink in the morning to get yourself going after a heavy drinking session?: Never 7. How often during the last year have you had a feeling of guilt of remorse after drinking?: Never 8. How often during the last year have you been unable to remember what happened the night before because you had been drinking?: Never 9. Have you or someone else been injured as a result of your drinking?: No 10. Has a relative or friend or a doctor or another health worker been concerned about your drinking or suggested you cut down?: No Alcohol Use Disorder Identification Test Final Score (AUDIT): 0 Alcohol Brief Interventions/Follow-up: AUDIT Score <7 follow-up not indicated Substance Abuse History in the last 12 months:  No. 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  I do not see any definite documentation of it so it must predate the records from [facility].  He tells me he does not use any alcohol or drugs recently. Previous Psychotropic Medications: Yes  Psychological Evaluations: Yes  Past Medical History:  Past Medical History:  Diagnosis Date  . Acute pulmonary edema ([place]) 2017  . Acute respiratory  failure with hypoxia ([place]) 2017  . Bipolar 1 disorder ([place])   . COPD (chronic obstructive pulmonary disease) ([place])   . Depression   . Hypertension   . Myocardial infarction ([place])   . NSTEMI (non-ST elevated myocardial infarction) ([place])   . 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 Seizures ([place])    childhood  . Stroke ([place])   . Tobacco abuse    History reviewed. No pertinent surgical history. Family History: History reviewed. No pertinent family history. Family Psychiatric  History: Knows of none Tobacco Screening: Have you used any form of tobacco in the last 30 days? (Cigarettes, Smokeless Tobacco, Cigars, and/or Pipes): No Social History:  Social History   Substance and Sexual Activity  Alcohol Use Not Currently     Social History   Substance and Sexual Activity  Drug Use Never    Additional  Social History:      History of alcohol / drug use?: Yes                    Allergies:   Allergies  Allergen Reactions  . Iodinated Diagnostic Agents Shortness Of Breath  . Penicillins Anaphylaxis and Shortness Of Breath    Respiratory  Tolerated cefuroxime on 01/06/16  . Strawberry Extract Anaphylaxis  . Cefepime Itching    Empiric antibiotic, developed pruritis.   . Eggs Or Egg-Derived Products Itching    Medicine related, pt tolerates ingestion of eggs  . Erythromycin Itching  . Sulfa Antibiotics Itching, Nausea And Vomiting and Nausea Only   Lab Results:  Results for orders placed or performed during the hospital encounter of 05/14/19 (from the past 48 hour(s))  Hepatic function panel     Status: None   Collection Time: 05/15/19  6:51 AM  Result Value Ref Range   Total Protein 7.9 6.5 - 8.1 g/dL   Albumin 4.5 3.5 - 5.0 g/dL   AST 29 15 - 41 U/L   ALT 21 0 - 44 U/L   Alkaline Phosphatase 92 38 - 126 U/L   Total Bilirubin 0.8 0.3 - 1.2 mg/dL   Bilirubin, Direct <1.6 0.0 - 0.2 mg/dL   Indirect Bilirubin NOT CALCULATED 0.3 - 0.9 mg/dL     Comment: Performed at [facility], [address] [place], [place] [address]  Lipid panel     Status: Abnormal   Collection Time: 05/15/19  6:51 AM  Result Value Ref Range   Cholesterol 192 0 - 200 mg/dL   Triglycerides 454 (H) <150 mg/dL   HDL 46 >09 mg/dL   Total CHOL/HDL Ratio 4.2 RATIO   VLDL 56 (H) 0 - 40 mg/dL   LDL Cholesterol 90 0 - 99 mg/dL    Comment:        Total Cholesterol/HDL:CHD Risk Coronary Heart Disease Risk Table                     Men   Women  1/2 Average Risk   3.4   3.3  Average Risk       5.0   4.4  2 X Average Risk   9.6   7.1  3 X Average Risk  23.4   11.0        Use the calculated Patient Ratio above and the CHD Risk Table to determine the patient's CHD Risk.        ATP III CLASSIFICATION (LDL):  <100     mg/dL   Optimal  811-914  mg/dL   Near or Above                    Optimal  130-159  mg/dL   Borderline  782-956  mg/dL   High  >213     mg/dL   Very High Performed at [facility], [address] [place], [place] [address]   TSH     Status: Abnormal   Collection Time: 05/15/19  6:51 AM  Result Value Ref Range   TSH 9.124 (H) 0.350 - 4.500 uIU/mL    Comment: Performed by a 3rd Generation assay with a functional sensitivity of <=0.01 uIU/mL. Performed at [facility], [address] [place], [place] [address]     Blood Alcohol level:  Lab Results  Component Value Date   [facility] <10 05/13/2019   ETH <10 12/23/2018    Metabolic  Disorder Labs:  Lab Results  Component Value Date   HGBA1C 5.6 11/03/2018   MPG 114.02 11/03/2018   No results found for: PROLACTIN Lab Results  Component Value Date   CHOL 192 05/15/2019   TRIG 281 (H) 05/15/2019   HDL 46 05/15/2019   CHOLHDL 4.2 05/15/2019   VLDL 56 (H) 05/15/2019   LDLCALC 90 05/15/2019   LDLCALC 104 (H) 12/23/2018    Current Medications: Current Facility-Administered Medications  Medication Dose Route Frequency Provider Last Rate Last Dose  .  acetaminophen (TYLENOL) tablet 650 mg  650 mg Oral Q6H PRN Cristofano, Paul A, MD      . albuterol (VENTOLIN HFA) 108 (90 Base) MCG/ACT inhaler 2 puff  2 puff Inhalation Q6H PRN Cristofano, Worthy Rancher, MD      . alum & mag hydroxide-simeth (MAALOX/MYLANTA) 200-200-20 MG/5ML suspension 30 mL  30 mL Oral Q4H PRN Cristofano, Worthy Rancher, MD      . aspirin EC tablet 81 mg  81 mg Oral Daily Cristofano, Worthy Rancher, MD   81 mg at 05/15/19 0820  . carvedilol (COREG) tablet 6.25 mg  6.25 mg Oral BID Cristofano, Worthy Rancher, MD   6.25 mg at 05/15/19 0820  . clopidogrel (PLAVIX) tablet 75 mg  75 mg Oral Daily Cristofano, Worthy Rancher, MD   75 mg at 05/15/19 0820  . divalproex (DEPAKOTE ER) 24 hr tablet 500 mg  500 mg Oral BID Cristofano, Worthy Rancher, MD   500 mg at 05/15/19 0820  . escitalopram (LEXAPRO) tablet 10 mg  10 mg Oral QHS Cristofano, Worthy Rancher, MD      . Melene Muller ON 05/16/2019] influenza vac split quadrivalent PF (FLUARIX) injection 0.5 mL  0.5 mL Intramuscular Tomorrow-1000 Beronica Lansdale T, MD      . levothyroxine (SYNTHROID) tablet 25 mcg  25 mcg Oral Daily Cristofano, Worthy Rancher, MD   25 mcg at 05/15/19 0729  . lisinopril (ZESTRIL) tablet 2.5 mg  2.5 mg Oral Daily Cristofano, Worthy Rancher, MD   2.5 mg at 05/15/19 0847  . magnesium hydroxide (MILK OF MAGNESIA) suspension 30 mL  30 mL Oral Daily PRN Cristofano, Paul A, MD      . QUEtiapine (SEROQUEL) tablet 100 mg  100 mg Oral QHS Mauro Arps T, MD      . QUEtiapine (SEROQUEL) tablet 50 mg  50 mg Oral BH-q7a Cristofano, Worthy Rancher, MD   50 mg at 05/15/19 0707  . QUEtiapine (SEROQUEL) tablet 50 mg  50 mg Oral Q1200 Cristofano, Worthy Rancher, MD       PTA Medications: Medications Prior to Admission  Medication Sig Dispense Refill Last Dose  . albuterol (PROVENTIL) (2.5 MG/3ML) 0.083% nebulizer solution Take 2.5 mg by nebulization every 6 (six) hours as needed for wheezing or shortness of breath.     [name] albuterol (VENTOLIN HFA) 108 (90 Base) MCG/ACT inhaler Inhale 2 puffs into the lungs every 6 (six)  hours as needed for wheezing.     [name] aspirin EC 81 MG tablet Take 81 mg by mouth daily.     [name] atorvastatin (LIPITOR) 80 MG tablet Take 1 tablet by mouth daily.     . carvedilol (COREG) 6.25 MG tablet Take 1 tablet by mouth 2 (two) times daily.     . clopidogrel (PLAVIX) 75 MG tablet Take 1 tablet by mouth daily.     . divalproex (DEPAKOTE ER) 500 MG 24 hr tablet Take 1 tablet by mouth 2 (two) times daily.     [name]  escitalopram (LEXAPRO) 10 MG tablet Take 1 tablet by mouth at bedtime.     [name] levothyroxine (SYNTHROID) 25 MCG tablet Take 1 tablet by mouth daily.     [name] lisinopril (ZESTRIL) 2.5 MG tablet Take 1 tablet by mouth daily.     . Multiple Vitamin (MULTI-VITAMIN) tablet Take 1 tablet by mouth daily.     . nicotine (NICODERM CQ - DOSED IN MG/24 HOURS) 14 mg/24hr patch 14 mg patch chest wall daily (may substitute generic) 30 patch 0   . polyethylene glycol (MIRALAX / GLYCOLAX) 17 g packet Take 17 g by mouth daily as needed for moderate constipation. 14 each 0   . QUEtiapine (SEROQUEL) 25 MG tablet Take by mouth. Take 2 tablets (50 mg total) by mouth every morning, 2 tablets at lunch, and 3 tablets (75 mg total) nightly     . Tiotropium Bromide Monohydrate (SPIRIVA RESPIMAT) 2.5 MCG/ACT AERS Inhale 2 puffs into the lungs daily.     . traZODone (DESYREL) 100 MG tablet Take 1 tablet by mouth at bedtime.     . vitamin B-12 (CYANOCOBALAMIN) 1000 MCG tablet Take 1 tablet by mouth daily.       Musculoskeletal: Strength & Muscle Tone: within normal limits Gait & Station: normal Patient leans: N/A  Psychiatric Specialty Exam: Physical Exam  Nursing note and vitals reviewed. Constitutional: He appears well-developed and well-nourished.  HENT:  Head: Normocephalic and atraumatic.  Eyes: Pupils are equal, round, and reactive to light. Conjunctivae are normal.  Neck: Normal range of motion.  Cardiovascular: Regular rhythm and normal heart sounds.  Respiratory: Effort normal.  GI: Soft.   Musculoskeletal: Normal range of motion.  Neurological: He is alert.  Skin: Skin is warm and dry.  Psychiatric: His mood appears anxious. His affect is angry and labile. His speech is delayed and tangential. He is agitated. He is not aggressive. Thought content is paranoid. Cognition and memory are impaired. He expresses impulsivity and inappropriate judgment. He expresses homicidal ideation. He expresses no homicidal plans. He exhibits abnormal recent memory and abnormal remote memory.    Review of Systems  Constitutional: Negative.   HENT: Negative.   Eyes: Negative.   Respiratory: Negative.   Cardiovascular: Negative.   Gastrointestinal: Negative.   Musculoskeletal: Negative.   Skin: Negative.   Neurological: Negative.   Psychiatric/Behavioral: Negative for depression, hallucinations, memory loss, substance abuse and suicidal ideas. The patient is not nervous/anxious and does not have insomnia.     Blood pressure 118/78, pulse 69, temperature 97.7 F (36.5 C), temperature source Oral, resp. rate 17, height 5\' 9"  (1.753 m), weight 78.9 kg, SpO2 100 %.Body mass index is 25.7 kg/m.  General Appearance: Casual  Eye Contact:  Minimal  Speech:  Blocked and Slow  Volume:  Decreased  Mood:  Angry, Dysphoric and Irritable  Affect:  Inappropriate and Labile  Thought Process:  Disorganized  Orientation:  Other:  Inexact.  Vaguely knows the basics but not the specifics  Thought Content:  Illogical, Paranoid Ideation, Rumination and Tangential  Suicidal Thoughts:  No  Homicidal Thoughts:  Yes.  without intent/plan  Memory:  Immediate;   Fair Recent;   Poor Remote;   Poor  Judgement:  Impaired  Insight:  Shallow  Psychomotor Activity:  Decreased  Concentration:  Concentration: Poor  Recall:  Poor  Fund of Knowledge:  Poor  Language:  Fair  Akathisia:  No  Handed:  Right  AIMS (if indicated):     Assets:  Desire for Improvement Housing  Resilience Social Support Others:  Current  reported involvement of Adult Protective Services  ADL's:  Impaired  Cognition:  Impaired,  Mild and Moderate  Sleep:  Number of Hours: 7.5    Treatment Plan Summary: Daily contact with patient to assess and evaluate symptoms and progress in treatment, Medication management and Plan Continue 15-minute checks.  Continue medication management with Depakote.  Slight increase in Seroquel.  Check EKG.  Continue medicines for hypothyroidism and hypertension.  We will get APS involved as soon as possible.  Observation Level/Precautions:  15 minute checks  Laboratory:  EKG  Psychotherapy:    Medications:    Consultations:    Discharge Concerns:    Estimated LOS:  Other:     Physician Treatment Plan for Primary Diagnosis: Bipolar 1 disorder (HCC) Long Term Goal(s): Improvement in symptoms so as ready for discharge  Short Term Goals: Ability to verbalize feelings will improve, Ability to demonstrate self-control will improve and Ability to identify and develop effective coping behaviors will improve  Physician Treatment Plan for Secondary Diagnosis: Principal Problem:   Bipolar 1 disorder (HCC) Active Problems:   Coronary artery disease   Essential hypertension   History of stroke   Homicidal ideation   Hypothyroidism  Long Term Goal(s): Improvement in symptoms so as ready for discharge  Short Term Goals: Ability to maintain clinical measurements within normal limits will improve and Compliance with prescribed medications will improve  I certify that inpatient services furnished can reasonably be expected to improve the patient's condition.    Mordecai Rasmussen, MD 11/7/202011:19 AM
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
## 2019-05-15 NOTE — Progress Notes (Addendum)
D: Bipolar A: Patient stated slept good last night .Stated appetite good and energy level  Is normal. Stated concentration is good . Stated on Depression scale 0, hopeless 10 and anxiety 0 .( low 0-10 high) Denies suicidal  homicidal ideations  .  No auditory hallucinations  No pain concerns . Appropriate ADL'S. Interacting with peers and staff. Encourage patient participation with unit programing , encourage verbalization of feeling  in therapy groups. Patient knowledgeable of information received  concerning medication.   Voice no concerns around sleep and wake cycle .  Patient working on  Estate agent , and anxiety. Patient  denies suicidal ideations  Encourage patient participation with unit programming . Instruction  Given on  Medication , verbalize understanding. Attended Treatment Team :  Stated  Goal  Is for placement  referral  To APS . Patient stated he had a altercation  With  daughter . Patient later stated  His sister thinks he attempted suicide . Out of bed attending  Unit programing.   R: Voice no other concerns. Staff continue to monitor

## 2019-05-15 NOTE — Tx Team (Signed)
Initial Treatment Plan 05/15/2019 12:26 AM Carl [name] [id number]    PATIENT STRESSORS: Health problems Marital or family conflict Traumatic event   PATIENT STRENGTHS: Ability for insight Average or above average intelligence Capable of independent living Motivation for treatment/growth   PATIENT IDENTIFIED PROBLEMS: medication noncompliance     Bipolar 1 disorder                   DISCHARGE CRITERIA:  Adequate post-discharge living arrangements Improved stabilization in mood, thinking, and/or behavior Motivation to continue treatment in a less acute level of care  PRELIMINARY DISCHARGE PLAN: Participate in family therapy  PATIENT/FAMILY INVOLVEMENT: This treatment plan has been presented to and reviewed with the patient, [name] patient and family have been given the opportunity to ask questions and make suggestions.  Harl Bowie, RN 05/15/2019, 12:26 AM

## 2019-05-16 NOTE — Plan of Care (Signed)
  Problem: Education: Goal: Utilization of techniques to improve thought processes will improve Outcome: Progressing Goal: Knowledge of the prescribed therapeutic regimen will improve Outcome: Progressing  D: Patient has been isolative to room. Mood is pleasant. Affect is appropriate to circumstance. Denies SI, HI and AVH A: continue to monitor for safety R: Safety maintained.

## 2019-05-16 NOTE — [facility] Group Notes (Signed)
LCSW Group Therapy Note 05/16/2019 1:15pm  Type of Therapy and Topic: Group Therapy: Feelings Around Returning Home & Establishing a Supportive Framework and Supporting Oneself When Supports Not Available  Participation Level: Active  Description of Group:  Patients first processed thoughts and feelings about upcoming discharge. These included fears of upcoming changes, lack of change, new living environments, judgements and expectations from others and overall stigma of mental health issues. The group then discussed the definition of a supportive framework, what that looks and feels like, and how do to discern it from an unhealthy non-supportive network. The group identified different types of supports as well as what to do when your family/friends are less than helpful or unavailable  Therapeutic Goals  1. Patient will identify one healthy supportive network that they can use at discharge. 2. Patient will identify one factor of a supportive framework and how to tell it from an unhealthy network. 3. Patient able to identify one coping skill to use when they do not have positive supports from others. 4. Patient will demonstrate ability to communicate their needs through discussion and/or role plays.  Summary of Patient Progress:   Patient scored his mood a 10 (10 best.) Pt engaged during group session. As patients processed their anxiety about discharge and described healthy supports patient shared he is not ready to be discharge.  Patients identified at least one self-care tool they were willing to use after discharge.   Therapeutic Modalities Cognitive Behavioral Therapy Motivational Interviewing   Tanette Chauca  CUEBAS-COLON, LCSW 05/16/2019 11:13 AM

## 2019-05-16 NOTE — Progress Notes (Signed)
[facility] MD Progress Note  05/16/2019 10:44 AM Carl [name]  MRN:  [id number] Subjective: Follow-up for this gentleman with bipolar disorder who got in an argument with his daughter and made some homicidal statements.  Patient managed to sleep okay last night.  Today he presents as significantly [place].  He was able to talk about his situation without getting agitated.  He is still mad at his daughter but did not make any homicidal statements.  He has been calm and appropriate here on the unit.  His only complaint to me was that he felt like he was not getting the diet that he ordered.  At first I had thought that must mean he was on a heart healthy diet but it looks like he is on regular diet so I do not know what the problem is.  He needs to just keep ordering what he wants.  His blood pressure is slightly high but not so much that I need to add any medicine.  No indication for changing his psychiatric medicine today. Principal Problem: Bipolar 1 disorder (HCC) Diagnosis: Principal Problem:   Bipolar 1 disorder (HCC) Active Problems:   Coronary artery disease   Essential hypertension   History of stroke   Homicidal ideation   Hypothyroidism  Total Time spent with patient: 30 minutes  Past Psychiatric History: Past history of longstanding anger problems and mood instability.  Past Medical History:  Past Medical History:  Diagnosis Date  . Acute pulmonary edema (HCC) 2017  . Acute respiratory failure with hypoxia (HCC) 2017  . Bipolar 1 disorder (HCC)   . COPD (chronic obstructive pulmonary disease) (HCC)   . Depression   . Hypertension   . Myocardial infarction (HCC)   . NSTEMI (non-ST elevated myocardial infarction) (HCC)   . 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 Seizures (HCC)    childhood  . Stroke (HCC)   . Tobacco abuse    History reviewed. No pertinent surgical history. Family History: History reviewed. No pertinent family history. Family Psychiatric   History: See previous Social History:  Social History   Substance and Sexual Activity  Alcohol Use Not Currently     Social History   Substance and Sexual Activity  Drug Use Never    Social History   Socioeconomic History  . Marital status: Widowed    Spouse name: Not on file  . Number of children: Not on file  . Years of education: Not on file  . Highest education level: Not on file  Occupational History  . Not on file  Social Needs  . Financial resource strain: Not on file  . Food insecurity    Worry: Not on file    Inability: Not on file  . Transportation needs    Medical: Not on file    Non-medical: Not on file  Tobacco Use  . Smoking status: Former Smoker    Packs/day: 0.00    Years: 0.00    Pack years: 0.00  . Smokeless tobacco: Never Used  Substance and Sexual Activity  . Alcohol use: Not Currently  . Drug use: Never  . Sexual activity: Not Currently    Birth control/protection: Abstinence  Lifestyle  . Physical activity    Days per week: Not on file    Minutes per session: Not on file  . Stress: Not on file  Relationships  . Social Musician on phone: Not on file    Gets together: Not on file  Attends religious service: Not on file    Active member of club or organization: Not on file    Attends meetings of clubs or organizations: Not on file    Relationship status: Not on file  Other Topics Concern  . Not on file  Social History Narrative  . Not on file   Additional Social History:    History of alcohol / drug use?: Yes                    Sleep: Fair  Appetite:  Fair  Current Medications: Current Facility-Administered Medications  Medication Dose Route Frequency Provider Last Rate Last Dose  . acetaminophen (TYLENOL) tablet 650 mg  650 mg Oral Q6H PRN Cristofano, Dorene Ar, MD   650 mg at 05/15/19 1439  . albuterol (VENTOLIN HFA) 108 (90 Base) MCG/ACT inhaler 2 puff  2 puff Inhalation Q6H PRN Cristofano, Paul A, MD       . alum & mag hydroxide-simeth (MAALOX/MYLANTA) 200-200-20 MG/5ML suspension 30 mL  30 mL Oral Q4H PRN Cristofano, Dorene Ar, MD      . aspirin EC tablet 81 mg  81 mg Oral Daily Cristofano, Dorene Ar, MD   81 mg at 05/16/19 0806  . carvedilol (COREG) tablet 6.25 mg  6.25 mg Oral BID Cristofano, Dorene Ar, MD   6.25 mg at 05/16/19 0806  . clopidogrel (PLAVIX) tablet 75 mg  75 mg Oral Daily Cristofano, Dorene Ar, MD   75 mg at 05/16/19 0806  . divalproex (DEPAKOTE ER) 24 hr tablet 500 mg  500 mg Oral BID Cristofano, Dorene Ar, MD   500 mg at 05/16/19 0806  . escitalopram (LEXAPRO) tablet 10 mg  10 mg Oral QHS Cristofano, Dorene Ar, MD   10 mg at 05/15/19 2122  . influenza vac split quadrivalent PF (FLUARIX) injection 0.5 mL  0.5 mL Intramuscular Tomorrow-1000 Clapacs, John T, MD      . levothyroxine (SYNTHROID) tablet 25 mcg  25 mcg Oral Daily Cristofano, Dorene Ar, MD   25 mcg at 05/15/19 0729  . lisinopril (ZESTRIL) tablet 2.5 mg  2.5 mg Oral Daily Cristofano, Dorene Ar, MD   2.5 mg at 05/16/19 0913  . magnesium hydroxide (MILK OF MAGNESIA) suspension 30 mL  30 mL Oral Daily PRN Cristofano, Paul A, MD      . QUEtiapine (SEROQUEL) tablet 100 mg  100 mg Oral QHS Clapacs, John T, MD   100 mg at 05/15/19 2123  . QUEtiapine (SEROQUEL) tablet 50 mg  50 mg Oral BH-q7a Cristofano, Dorene Ar, MD   50 mg at 05/16/19 0716  . QUEtiapine (SEROQUEL) tablet 50 mg  50 mg Oral Q1200 Cristofano, Dorene Ar, MD   50 mg at 05/15/19 1154    Lab Results:  Results for orders placed or performed during the hospital encounter of 05/14/19 (from the past 48 hour(s))  Hepatic function panel     Status: None   Collection Time: 05/15/19  6:51 AM  Result Value Ref Range   Total Protein 7.9 6.5 - 8.1 g/dL   Albumin 4.5 3.5 - 5.0 g/dL   AST 29 15 - 41 U/L   ALT 21 0 - 44 U/L   Alkaline Phosphatase 92 38 - 126 U/L   Total Bilirubin 0.8 0.3 - 1.2 mg/dL   Bilirubin, Direct <0.1 0.0 - 0.2 mg/dL   Indirect Bilirubin NOT CALCULATED 0.3 - 0.9 mg/dL     Comment: Performed at [facility], [address]  [place], [place] [address]  Lipid panel     Status: Abnormal   Collection Time: 05/15/19  6:51 AM  Result Value Ref Range   Cholesterol 192 0 - 200 mg/dL   Triglycerides 443 (H) <150 mg/dL   HDL 46 >15 mg/dL   Total CHOL/HDL Ratio 4.2 RATIO   VLDL 56 (H) 0 - 40 mg/dL   LDL Cholesterol 90 0 - 99 mg/dL    Comment:        Total Cholesterol/HDL:CHD Risk Coronary Heart Disease Risk Table                     Men   Women  1/2 Average Risk   3.4   3.3  Average Risk       5.0   4.4  2 X Average Risk   9.6   7.1  3 X Average Risk  23.4   11.0        Use the calculated Patient Ratio above and the CHD Risk Table to determine the patient's CHD Risk.        ATP III CLASSIFICATION (LDL):  <100     mg/dL   Optimal  400-867  mg/dL   Near or Above                    Optimal  130-159  mg/dL   Borderline  619-509  mg/dL   High  >326     mg/dL   Very High Performed at [facility], [address] [place], [place] [address]   TSH     Status: Abnormal   Collection Time: 05/15/19  6:51 AM  Result Value Ref Range   TSH 9.124 (H) 0.350 - 4.500 uIU/mL    Comment: Performed by a 3rd Generation assay with a functional sensitivity of <=0.01 uIU/mL. Performed at [facility], [address] [place], [place] [address]     Blood Alcohol level:  Lab Results  Component Value Date   [facility] <10 05/13/2019   ETH <10 12/23/2018    Metabolic Disorder Labs: Lab Results  Component Value Date   HGBA1C 5.6 11/03/2018   MPG 114.02 11/03/2018   No results found for: PROLACTIN Lab Results  Component Value Date   CHOL 192 05/15/2019   TRIG 281 (H) 05/15/2019   HDL 46 05/15/2019   CHOLHDL 4.2 05/15/2019   VLDL 56 (H) 05/15/2019   LDLCALC 90 05/15/2019   LDLCALC 104 (H) 12/23/2018    Physical Findings: AIMS: Facial and Oral Movements Muscles of Facial Expression: None, normal Lips and Perioral Area: None, normal Jaw:  None, normal Tongue: None, normal,Extremity Movements Upper (arms, wrists, hands, fingers): None, normal Lower (legs, knees, ankles, toes): None, normal, Trunk Movements Neck, shoulders, hips: None, normal, Overall Severity Severity of abnormal movements (highest score from questions above): None, normal Incapacitation due to abnormal movements: None, normal Patient's awareness of abnormal movements (rate only patient's report): No Awareness, Dental Status Current problems with teeth and/or dentures?: Yes Does patient usually wear dentures?: No  CIWA:  CIWA-Ar Total: 1 COWS:  COWS Total Score: 0  Musculoskeletal: Strength & Muscle Tone: within normal limits Gait & Station: normal Patient leans: N/A  Psychiatric Specialty Exam: Physical Exam  Nursing note and vitals reviewed. Constitutional: He appears well-developed and well-nourished.  HENT:  Head: Normocephalic and atraumatic.  Eyes: Pupils are equal, round, and reactive to light. Conjunctivae are normal.  Neck: Normal range of motion.  Cardiovascular: Regular rhythm and  normal heart sounds.  Respiratory: Effort normal. No respiratory distress.  GI: Soft.  Musculoskeletal: Normal range of motion.  Neurological: He is alert.  Skin: Skin is warm and dry.  Psychiatric: He has a normal mood and affect. His speech is normal and behavior is normal. Thought content normal. Cognition and memory are normal. He expresses impulsivity.    Review of Systems  Constitutional: Negative.   HENT: Negative.   Eyes: Negative.   Respiratory: Negative.   Cardiovascular: Negative.   Gastrointestinal: Negative.   Musculoskeletal: Negative.   Skin: Negative.   Neurological: Negative.   Psychiatric/Behavioral: Negative.     Blood pressure (!) 143/81, pulse 89, temperature 98 F (36.7 C), temperature source Oral, resp. rate 18, height 5\' 9"  (1.753 m), weight 78.9 kg, SpO2 99 %.Body mass index is 25.7 kg/m.  General Appearance: Casual  Eye  Contact:  Fair  Speech:  Clear and Coherent  Volume:  Normal  Mood:  Euthymic  Affect:  Constricted  Thought Process:  Coherent  Orientation:  Full (Time, Place, and Person)  Thought Content:  Logical  Suicidal Thoughts:  No  Homicidal Thoughts:  No  Memory:  Immediate;   Fair Recent;   Fair Remote;   Fair  Judgement:  Fair  Insight:  Fair  Psychomotor Activity:  Normal  Concentration:  Concentration: Fair  Recall:  [employer]Fair  Fund of Knowledge:  Fair  Language:  Fair  Akathisia:  Negative  Handed:  Right  AIMS (if indicated):     Assets:  Desire for Improvement  ADL's:  Intact  Cognition:  WNL  Sleep:  Number of Hours: 8     Treatment Plan Summary: Daily contact with patient to assess and evaluate symptoms and progress in treatment, Medication management and Plan EKG came back.  His QT corrected is still over 500 but it is also stable from what it was before.  This is not good but at least it is stable.  And Seroquel has been clearly helpful for him with his mood.  We will go up on the dose and if we needed to change something I will try and aim for something that would not have any QT interaction.  Meanwhile no change in psychiatric medicine as he seems to be reasonably stable today.  He is quite aware that Adult Protective Services are involved in the case and we will have to get in touch with them starting tomorrow to see what they are going to be able to do about placement.  Mordecai RasmussenJohn Clapacs, MD 05/16/2019, 10:44 AM

## 2019-05-16 NOTE — Progress Notes (Signed)
D: Patient has been isolative to room. Mood is pleasant. Affect is appropriate to circumstance. Denies SI, HI and AVH A: continue to monitor for safety R: Safety maintained.

## 2019-05-16 NOTE — Plan of Care (Signed)
D- Patient alert and oriented. Patient presents in a pleasant mood on assessment stating that he slept ok last night and had no complaints or concerns to voice to this Probation officer. Patient denies SI, HI, AVH, and pain at this time. Patient also denies any signs/symptoms of depression/anxiety, reporting that overall he is "doing good". Patient had no stated goals for today.  A- Scheduled medications administered to patient, per MD orders. Support and encouragement provided.  Routine safety checks conducted every 15 minutes.  Patient informed to notify staff with problems or concerns.  R- No adverse drug reactions noted. Patient contracts for safety at this time. Patient compliant with medications and treatment plan. Patient receptive, calm, and cooperative. Patient interacts well with others on the unit.  Patient remains safe at this time.  Problem: Education: Goal: Utilization of techniques to improve thought processes will improve Outcome: Progressing Goal: Knowledge of the prescribed therapeutic regimen will improve Outcome: Progressing   Problem: Activity: Goal: Interest or engagement in leisure activities will improve Outcome: Progressing Goal: Imbalance in normal sleep/wake cycle will improve Outcome: Progressing   Problem: Coping: Goal: Coping ability will improve Outcome: Progressing Goal: Will verbalize feelings Outcome: Progressing   Problem: Health Behavior/Discharge Planning: Goal: Ability to make decisions will improve Outcome: Progressing Goal: Compliance with therapeutic regimen will improve Outcome: Progressing   Problem: Role Relationship: Goal: Will demonstrate positive changes in social behaviors and relationships Outcome: Progressing   Problem: Safety: Goal: Ability to disclose and discuss suicidal ideas will improve Outcome: Progressing Goal: Ability to identify and utilize support systems that promote safety will improve Outcome: Progressing   Problem:  Self-Concept: Goal: Will verbalize positive feelings about self Outcome: Progressing Goal: Level of anxiety will decrease Outcome: Progressing   Problem: Consults Goal: Concurrent Medical Patient Education Description: (See Patient Education Module for education specifics) Outcome: Progressing   Problem: BHH Concurrent Medical Problem Goal: LTG-Pt will be physically stable and he/significant other Description: (Patient will be physically stable and he/significant other will be able to verbalize understanding of follow-up care and symptoms that would warrant further treatment) Outcome: Progressing Goal: STG-Vital signs will be within defined limits or stabilized Description: (STG- Vital signs will be within defined limits or stabilized for individual) Outcome: Progressing Goal: STG-Compliance with medication and/or treatment as ordered Description: (STG-Compliance with medication and/or treatment as ordered by MD) Outcome: Progressing Goal: STG-Verbalize two symptoms that would warrant further Description: (STG-Verbalize two symptoms that would warrant further treatment) Outcome: Progressing Goal: STG-Patient will participate in management/stabilization Description: (STG-Patient will participate in management/stabilization of medical condition) Outcome: Progressing Goal: STG-Other (Specify): Description: STG-Other Concurrent Medical (Specify): Outcome: Progressing

## 2019-05-16 NOTE — Progress Notes (Signed)
Patient already had breakfast, so this writer will administer Synthroid before lunch.

## 2019-05-17 DIAGNOSIS — F319 Bipolar disorder, unspecified: Principal | ICD-10-CM

## 2019-05-17 MED ORDER — TUBERCULIN PPD 5 UNIT/0.1ML ID SOLN
5.0000 [IU] | Freq: Once | INTRADERMAL | Status: AC
  Administered 2019-05-17: 5 [IU] via INTRADERMAL
  Filled 2019-05-17: qty 0.1

## 2019-05-17 NOTE — Progress Notes (Signed)
Recreation Therapy Notes  INPATIENT RECREATION THERAPY ASSESSMENT  Patient Details Name: Carl [name] MRN: [id number] DOB: 1961-01-04 Today's Date: 05/17/2019       Information Obtained From: Patient  Able to Participate in Assessment/Interview: Yes  Patient Presentation: Responsive  Reason for Admission (Per Patient): Active Symptoms  Patient Stressors:    Coping Skills:   TV, Talk  Leisure Interests (2+):  Individual - TV(Martial arts)  Frequency of Recreation/Participation: Monthly  Awareness of Community Resources:     [employer]:     Current Use:    If no, Barriers?:    Expressed Interest in [place] of Residence:  Insurance underwriter  Patient Main Form of Transportation: Other (Comment)(Daughter)  Patient Strengths:  Nice  Patient Identified Areas of Improvement:  Get Stronger  Patient Goal for Hospitalization:  To get a place for me and my step daughter  Current SI (including self-harm):  No  Current HI:  No  Current AVH: No  Staff Intervention Plan: Group Attendance, Collaborate with Interdisciplinary Treatment Team  Consent to Intern Participation: N/A  Carl [name] 05/17/2019, 2:44 PM

## 2019-05-17 NOTE — [place] FL2 (Signed)
[place] MEDICAID FL2 LEVEL OF CARE SCREENING TOOL     IDENTIFICATION  Patient Name: Carl [name] Birthdate: 12/12/1960 Sex: male Admission Date (Current Location): 05/14/2019  [place] and [place] Number:  Chiropodist and Address:  [facility] [address] [place], [place] [address]      Provider Number: [id number]  Attending Physician Name and Address:  Clapacs, Jackquline Denmark, MD  Relative Name and Phone Number:       Current Level of Care: Hospital Recommended Level of Care: Family Care Home, Other (Comment)(Group Home) Prior Approval Number:    Date Approved/Denied:   PASRR Number:    Discharge Plan: Other (Comment)([facility] or group home)    Current Diagnoses: Patient Active Problem List   Diagnosis Date Noted  . Coronary artery disease 05/15/2019  . Essential hypertension 05/15/2019  . History of stroke 05/15/2019  . Homicidal ideation 05/15/2019  . Hypothyroidism 05/15/2019  . COPD exacerbation (HCC) 03/22/2019  . Bipolar 1 disorder (HCC) 12/23/2018  . Acute respiratory failure (HCC) 11/03/2018    Orientation RESPIRATION BLADDER Height & Weight     Situation, Place, Time, Self  Normal Continent Weight: 174 lb (78.9 kg) Height:  5\' 9"  (175.3 cm)  BEHAVIORAL SYMPTOMS/MOOD NEUROLOGICAL BOWEL NUTRITION STATUS  Verbally abusive (none reported) Continent Diet(regular)  AMBULATORY STATUS COMMUNICATION OF NEEDS Skin   Independent Verbally Normal                       Personal Care Assistance Level of Assistance  (none reported)           Functional Limitations Info  Sight Sight Info: Impaired(wears glasses)        SPECIAL CARE FACTORS FREQUENCY  Blood pressure(Coronary artery disease)                    Contractures Contractures Info: Not present    Additional Factors Info  Code Status Code Status Info: FULL             Current Medications (05/17/2019):  This is the current hospital active medication  list Current Facility-Administered Medications  Medication Dose Route Frequency Provider Last Rate Last Dose  . acetaminophen (TYLENOL) tablet 650 mg  650 mg Oral Q6H PRN Cristofano, 13/03/2019, MD   650 mg at 05/15/19 1439  . albuterol (VENTOLIN HFA) 108 (90 Base) MCG/ACT inhaler 2 puff  2 puff Inhalation Q6H PRN Cristofano, Paul A, MD      . alum & mag hydroxide-simeth (MAALOX/MYLANTA) 200-200-20 MG/5ML suspension 30 mL  30 mL Oral Q4H PRN Cristofano, 01-14-2001, MD      . aspirin EC tablet 81 mg  81 mg Oral Daily Cristofano, Worthy Rancher, MD   81 mg at 05/17/19 0825  . carvedilol (COREG) tablet 6.25 mg  6.25 mg Oral BID Cristofano, 13/09/20, MD   6.25 mg at 05/17/19 0825  . clopidogrel (PLAVIX) tablet 75 mg  75 mg Oral Daily Cristofano, 13/09/20, MD   75 mg at 05/17/19 0825  . divalproex (DEPAKOTE ER) 24 hr tablet 500 mg  500 mg Oral BID Cristofano, 13/09/20, MD   500 mg at 05/17/19 0826  . escitalopram (LEXAPRO) tablet 10 mg  10 mg Oral QHS Cristofano, 13/09/20, MD   10 mg at 05/16/19 2124  . influenza vac split quadrivalent PF (FLUARIX) injection 0.5 mL  0.5 mL Intramuscular Tomorrow-1000 Clapacs, 2125, MD      . levothyroxine (SYNTHROID)  tablet 25 mcg  25 mcg Oral Daily Cristofano, Dorene Ar, MD   25 mcg at 05/17/19 0826  . lisinopril (ZESTRIL) tablet 2.5 mg  2.5 mg Oral Daily Cristofano, Dorene Ar, MD   2.5 mg at 05/17/19 0827  . magnesium hydroxide (MILK OF MAGNESIA) suspension 30 mL  30 mL Oral Daily PRN Cristofano, Paul A, MD      . QUEtiapine (SEROQUEL) tablet 100 mg  100 mg Oral QHS Clapacs, John T, MD   100 mg at 05/16/19 2124  . QUEtiapine (SEROQUEL) tablet 50 mg  50 mg Oral BH-q7a Cristofano, Dorene Ar, MD   50 mg at 05/17/19 0644  . QUEtiapine (SEROQUEL) tablet 50 mg  50 mg Oral Q1200 Cristofano, Dorene Ar, MD   50 mg at 05/17/19 1150  . tuberculin injection 5 Units  5 Units Intradermal Once Money, Lowry Ram, FNP         Discharge Medications: Please see discharge summary for a list of discharge  medications.  Relevant Imaging Results:  Relevant Lab Results:   Additional Aragon, LCSW

## 2019-05-17 NOTE — Progress Notes (Signed)
D - Patient was in the day room upon arrival to the unit. Patient was pleasant during assessment and denies SI/HI/AVH and pain. Patient endorses anxiety and depression. Patient observed interacting appropriately with staff and peers on the unit.   A - Patient compliant with medication administration per MD orders. Patient given education. Patient given support and encouragement to be active in his treatment plan. Patient informed to let staff know if there are any issues or problems on the unit.   R - Patient being monitored Q 15 minutes for safety per unit protocol. Patient remains safe on the unit.

## 2019-05-17 NOTE — Plan of Care (Signed)
Patient rated his depression and anxiety 3/10.Denies SI,HI and AVH.Patient stated that he need to get out of that house otherwise something worse will happen.Patient is appropriate with staff & peers.Compliant with medications.No issues verbalized.Appetite and energy level good.Attended some groups.Support and encouragement given.

## 2019-05-17 NOTE — Progress Notes (Signed)
D: Denies SI, HI and AVH A: continue to monitor for safety R: Safety maintained.

## 2019-05-17 NOTE — Progress Notes (Signed)
[facility] MD Progress Note  11/9/20[facility]20 4:57 PM Leanord AsalWiley Lukasiewicz  MRN:  [id number] Subjective: Follow-up for this gentleman with chronic anger and irritability.  Patient seen.  Patient has no new complaints other than still thinking that his menu is not coming in the way that he orders it.  He has not been angry or agitated today.  Taking care of his hygiene adequately.  Getting along with staff fine.  Tolerating medicine well.  Patient is aware that Adult Protective Services is on the case. Principal Problem: Bipolar 1 disorder (HCC) Diagnosis: Principal Problem:   Bipolar 1 disorder (HCC) Active Problems:   Coronary artery disease   Essential hypertension   History of stroke   Homicidal ideation   Hypothyroidism  Total Time spent with patient: 30 minutes  Past Psychiatric History: Longstanding problems with anger irritability aggression and impulsivity  Past Medical History:  Past Medical History:  Diagnosis Date  . Acute pulmonary edema (HCC) 2017  . Acute respiratory failure with hypoxia (HCC) 2017  . Bipolar 1 disorder (HCC)   . COPD (chronic obstructive pulmonary disease) (HCC)   . Depression   . Hypertension   . Myocardial infarction (HCC)   . NSTEMI (non-ST elevated myocardial infarction) (HCC)   . 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 Seizures (HCC)    childhood  . Stroke (HCC)   . Tobacco abuse    History reviewed. No pertinent surgical history. Family History: History reviewed. No pertinent family history. Family Psychiatric  History: See previous Social History:  Social History   Substance and Sexual Activity  Alcohol Use Not Currently     Social History   Substance and Sexual Activity  Drug Use Never    Social History   Socioeconomic History  . Marital status: Widowed    Spouse name: Not on file  . Number of children: Not on file  . Years of education: Not on file  . Highest education level: Not on file  Occupational History  .  Not on file  Social Needs  . Financial resource strain: Not on file  . Food insecurity    Worry: Not on file    Inability: Not on file  . Transportation needs    Medical: Not on file    Non-medical: Not on file  Tobacco Use  . Smoking status: Former Smoker    Packs/day: 0.00    Years: 0.00    Pack years: 0.00  . Smokeless tobacco: Never Used  Substance and Sexual Activity  . Alcohol use: Not Currently  . Drug use: Never  . Sexual activity: Not Currently    Birth control/protection: Abstinence  Lifestyle  . Physical activity    Days per week: Not on file    Minutes per session: Not on file  . Stress: Not on file  Relationships  . Social Musicianconnections    Talks on phone: Not on file    Gets together: Not on file    Attends religious service: Not on file    Active member of club or organization: Not on file    Attends meetings of clubs or organizations: Not on file    Relationship status: Not on file  Other Topics Concern  . Not on file  Social History Narrative  . Not on file   Additional Social History:    History of alcohol / drug use?: Yes  Sleep: Fair  Appetite:  Fair  Current Medications: Current Facility-Administered Medications  Medication Dose Route Frequency Provider Last Rate Last Dose  . acetaminophen (TYLENOL) tablet 650 mg  650 mg Oral Q6H PRN Cristofano, Dorene Ar, MD   650 mg at 05/15/19 1439  . albuterol (VENTOLIN HFA) 108 (90 Base) MCG/ACT inhaler 2 puff  2 puff Inhalation Q6H PRN Cristofano, Paul A, MD      . alum & mag hydroxide-simeth (MAALOX/MYLANTA) 200-200-20 MG/5ML suspension 30 mL  30 mL Oral Q4H PRN Cristofano, Dorene Ar, MD      . aspirin EC tablet 81 mg  81 mg Oral Daily Cristofano, Dorene Ar, MD   81 mg at 05/17/19 0825  . carvedilol (COREG) tablet 6.25 mg  6.25 mg Oral BID Cristofano, Dorene Ar, MD   6.25 mg at 05/17/19 1638  . clopidogrel (PLAVIX) tablet 75 mg  75 mg Oral Daily Cristofano, Dorene Ar, MD   75 mg at 05/17/19  0825  . divalproex (DEPAKOTE ER) 24 hr tablet 500 mg  500 mg Oral BID Cristofano, Dorene Ar, MD   500 mg at 05/17/19 1638  . escitalopram (LEXAPRO) tablet 10 mg  10 mg Oral QHS Cristofano, Dorene Ar, MD   10 mg at 05/16/19 2124  . influenza vac split quadrivalent PF (FLUARIX) injection 0.5 mL  0.5 mL Intramuscular Tomorrow-1000 Giulio Bertino T, MD      . levothyroxine (SYNTHROID) tablet 25 mcg  25 mcg Oral Daily Cristofano, Dorene Ar, MD   25 mcg at 05/17/19 0826  . lisinopril (ZESTRIL) tablet 2.5 mg  2.5 mg Oral Daily Cristofano, Dorene Ar, MD   2.5 mg at 05/17/19 0827  . magnesium hydroxide (MILK OF MAGNESIA) suspension 30 mL  30 mL Oral Daily PRN Cristofano, Paul A, MD      . QUEtiapine (SEROQUEL) tablet 100 mg  100 mg Oral QHS Florentina Marquart T, MD   100 mg at 05/16/19 2124  . QUEtiapine (SEROQUEL) tablet 50 mg  50 mg Oral BH-q7a Cristofano, Dorene Ar, MD   50 mg at 05/17/19 0644  . QUEtiapine (SEROQUEL) tablet 50 mg  50 mg Oral Q1200 Cristofano, Dorene Ar, MD   50 mg at 05/17/19 1150  . tuberculin injection 5 Units  5 Units Intradermal Once Money, Lowry Ram, FNP   5 Units at 05/17/19 1638    Lab Results: No results found for this or any previous visit (from the past 48 hour(s)).  Blood Alcohol level:  Lab Results  Component Value Date   ETH <10 05/13/2019   ETH <10 73/41/9379    Metabolic Disorder Labs: Lab Results  Component Value Date   HGBA1C 5.6 11/03/2018   MPG 114.02 11/03/2018   No results found for: PROLACTIN Lab Results  Component Value Date   CHOL 192 05/15/2019   TRIG 281 (H) 05/15/2019   HDL 46 05/15/2019   CHOLHDL 4.2 05/15/2019   VLDL 56 (H) 05/15/2019   LDLCALC 90 05/15/2019   LDLCALC 104 (H) 12/23/2018    Physical Findings: AIMS: Facial and Oral Movements Muscles of Facial Expression: None, normal Lips and Perioral Area: None, normal Jaw: None, normal Tongue: None, normal,Extremity Movements Upper (arms, wrists, hands, fingers): None, normal Lower (legs, knees, ankles,  toes): None, normal, Trunk Movements Neck, shoulders, hips: None, normal, Overall Severity Severity of abnormal movements (highest score from questions above): None, normal Incapacitation due to abnormal movements: None, normal Patient's awareness of abnormal movements (rate only patient's report): No Awareness, Dental Status Current  problems with teeth and/or dentures?: Yes Does patient usually wear dentures?: No  CIWA:  CIWA-Ar Total: 1 COWS:  COWS Total Score: 0  Musculoskeletal: Strength & Muscle Tone: within normal limits Gait & Station: normal Patient leans: N/A  Psychiatric Specialty Exam: Physical Exam  Nursing note and vitals reviewed. Constitutional: He appears well-developed and well-nourished.  HENT:  Head: Normocephalic and atraumatic.  Eyes: Pupils are equal, round, and reactive to light. Conjunctivae are normal.  Neck: Normal range of motion.  Cardiovascular: Regular rhythm and normal heart sounds.  Respiratory: Effort normal.  GI: Soft.  Musculoskeletal: Normal range of motion.  Neurological: He is alert.  Skin: Skin is warm and dry.  Psychiatric: He has a normal mood and affect. His speech is normal and behavior is normal. Thought content normal. He expresses impulsivity. He exhibits abnormal recent memory.    ROS  Blood pressure 120/78, pulse 80, temperature 97.6 F (36.4 C), temperature source Oral, resp. rate 18, height 5\' 9"  (1.753 m), weight 78.9 kg, SpO2 100 %.Body mass index is 25.7 kg/m.  General Appearance: Casual  Eye Contact:  Good  Speech:  Clear and Coherent  Volume:  Normal  Mood:  Euthymic  Affect:  Congruent  Thought Process:  Goal Directed  Orientation:  Full (Time, Place, and Person)  Thought Content:  Logical  Suicidal Thoughts:  No  Homicidal Thoughts:  No  Memory:  Immediate;   Fair Recent;   Fair Remote;   Fair  Judgement:  Fair  Insight:  Fair  Psychomotor Activity:  Normal  Concentration:  Concentration: Fair  Recall:  of Knowledge:  Fair  Language:  Fair  Akathisia:  No  Handed:  Right  AIMS (if indicated):     Assets:  Desire for Improvement Physical Health Resilience  ADL's:  Intact  Cognition:  Impaired,  Mild  Sleep:  Number of Hours: 8     Treatment Plan Summary: Daily contact with patient to assess and evaluate symptoms and progress in treatment, Medication management and Plan Patient seems to be doing well on current mood stabilizing medicine.  No new complaints.  Adult Protective Services is working with social work and we hope we will be able to come up with a discharge plan before too long.  Reassured patient that we will keep working on trying to get him the food he desires.  No change to medicine.  [employer], MD 05/17/2019, 4:57 PM

## 2019-05-17 NOTE — [facility] Group Notes (Signed)
LCSW Group Therapy Note   05/17/2019 1:00 PM  Type of Therapy and Topic:  Group Therapy:  Overcoming Obstacles   Participation Level:  Minimal   Description of Group:    In this group patients will be encouraged to explore what they see as obstacles to their own wellness and recovery. They will be guided to discuss their thoughts, feelings, and behaviors related to these obstacles. The group will process together ways to cope with barriers, with attention given to specific choices patients can make. Each patient will be challenged to identify changes they are motivated to make in order to overcome their obstacles. This group will be process-oriented, with patients participating in exploration of their own experiences as well as giving and receiving support and challenge from other group members.   Therapeutic Goals: 1. Patient will identify personal and current obstacles as they relate to admission. 2. Patient will identify barriers that currently interfere with their wellness or overcoming obstacles.  3. Patient will identify feelings, thought process and behaviors related to these barriers. 4. Patient will identify two changes they are willing to make to overcome these obstacles:      Summary of Patient Progress Patient was present in group. Patient participated some in discussing how "self-esteem" has been an obstacle for him. Patient was unable to identify how to to challenge this obstacle or what he was willing to do to address it.  Pt did appear to be an attentive listener as CSW discussed ways to address self-esteem, such as affirmations.     Therapeutic Modalities:   Cognitive Behavioral Therapy Solution Focused Therapy Motivational Interviewing Relapse Prevention Therapy  Assunta Curtis, MSW, LCSW 05/17/2019 12:38 PM

## 2019-05-17 NOTE — [facility] Counselor (Signed)
CSW received phone call from [place] at [facility] [phone] stating they have requested a protective order due to concerns of incompetency. She states the pt is homeless and cannot return to his family's home. She is requesting copy of covid 19 test, fl2, and tb test results and plans to work on finding the pt housing.

## 2019-05-17 NOTE — Plan of Care (Signed)
  Problem: Education: Goal: Utilization of techniques to improve thought processes will improve Outcome: Progressing Goal: Knowledge of the prescribed therapeutic regimen will improve Outcome: Progressing  D: Denies SI, HI and AVH A: continue to monitor for safety R: Safety maintained.

## 2019-05-17 NOTE — Plan of Care (Signed)
Patient compliant with medication administration per MD orders  Problem: Education: Goal: Knowledge of the prescribed therapeutic regimen will improve Outcome: Progressing   

## 2019-05-17 NOTE — Progress Notes (Signed)
Recreation Therapy Notes  Date: 05/17/2019  Time: 9:30 am  Location: Craft room  Behavioral response: Appropriate   Intervention Topic: Values  Discussion/Intervention:   Group content today was focused on values. The group identified what values are and where they come from. Individuals expressed some values and how many they have. Patients described how they go about add or removing values. The group described the importance of having values and how they go about using them in daily life. Patient participated in the intervention "My Values" where they were able to pick out values that were important to them and made a visual aide.  Clinical Observations/Feedback:  Patient came to group and was focused on what peers and staff had to say about values. Individual was social with peers and staff while participating in the intervention.    Carl [name] LRT/CTRS         Carl [name] 05/17/2019 12:17 PM

## 2019-05-17 NOTE — Progress Notes (Signed)
Pt reported that CSW team could contact his step-daughter, Cornelia Copa, but did not know her contact information. Information is not in pts chart.   Evalina Field, MSW, LCSW Clinical Social Work 05/17/2019 11:25 AM

## 2019-05-18 NOTE — Progress Notes (Signed)
Recreation Therapy Notes     Date: 05/18/2019  Time: 9:30 am  Location: Craft room  Behavioral response: Appropriate   Intervention Topic: Relaxation   Discussion/Intervention:   Group content today was focused on relaxation. The group defined relaxation and identified healthy ways to relax. Individuals expressed how much time they spend relaxing. Patients expressed how much their life would be if they did not make time for themselves to relax. The group stated ways they could improve their relaxation techniques in the future.  Individuals participated in the intervention "Time to Relax" where they had a chance to experience different relaxation techniques. Clinical Observations/Feedback:  Patient came to group and was focused on what peers and staff had to say about relaxation.  Individual was social with peers and staff while participating in the intervention.    Carl [name] LRT/CTRS       Jamirah Zelaya 05/18/2019 12:32 PM

## 2019-05-18 NOTE — Plan of Care (Signed)
Patient is appropriate in the unit.Rated his depression and anxiety 0/10.Denies SI,HI and AVH.Attended groups.Compliant with medications.Appetite and energy level good.Support and encouragement given.

## 2019-05-18 NOTE — Progress Notes (Signed)
[facility]  05/18/2019 4:07 PM Carl [name]  MRN:  [id number]   Subjective: Patient seen and reassessed by writer. Patient is alert and oriented at this time. Carl [name] is a 58 year old male who presents with chronic irritability and anger under IVC. Patient reports improved mood, and his affect is congruent. He is observed in the [employer] congregating with peers and watching TV. He rates his depression and anxiety 0/10 with 10 being the worst on Likert scale. He is taking his medication as directed. He denies any side effects at this time. He is anticipating discharge soon, once a secure place is found for him. He denies any si/hi/avh.    Principal Problem: Bipolar 1 disorder (HCC) Diagnosis: Principal Problem:   Bipolar 1 disorder (HCC) Active Problems:   Coronary artery disease   Essential hypertension   History of stroke   Homicidal ideation   Hypothyroidism  Total Time spent with patient: 30 minutes  Past Psychiatric History: Longstanding problems with anger irritability aggression and impulsivity  Past Medical History:  Past Medical History:  Diagnosis Date  . Acute pulmonary edema (HCC) 2017  . Acute respiratory failure with hypoxia (HCC) 2017  . Bipolar 1 disorder (HCC)   . COPD (chronic obstructive pulmonary disease) (HCC)   . Depression   . Hypertension   . Myocardial infarction (HCC)   . NSTEMI (non-ST elevated myocardial infarction) (HCC)   . 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 Seizures (HCC)    childhood  . Stroke (HCC)   . Tobacco abuse    History reviewed. No pertinent surgical history. Family History: History reviewed. No pertinent family history. Family Psychiatric  History: See previous Social History:  Social History   Substance and Sexual Activity  Alcohol Use Not Currently     Social History   Substance and Sexual Activity  Drug Use Never    Social History   Socioeconomic History  . Marital status: Widowed     Spouse name: Not on file  . Number of children: Not on file  . Years of education: Not on file  . Highest education level: Not on file  Occupational History  . Not on file  Social Needs  . Financial resource strain: Not on file  . Food insecurity    Worry: Not on file    Inability: Not on file  . Transportation needs    Medical: Not on file    Non-medical: Not on file  Tobacco Use  . Smoking status: Former Smoker    Packs/day: 0.00    Years: 0.00    Pack years: 0.00  . Smokeless tobacco: Never Used  Substance and Sexual Activity  . Alcohol use: Not Currently  . Drug use: Never  . Sexual activity: Not Currently    Birth control/protection: Abstinence  Lifestyle  . Physical activity    Days per week: Not on file    Minutes per session: Not on file  . Stress: Not on file  Relationships  . Social Musicianconnections    Talks on phone: Not on file    Gets together: Not on file    Attends religious service: Not on file    Active member of club or organization: Not on file    Attends meetings of clubs or organizations: Not on file    Relationship status: Not on file  Other Topics Concern  . Not on file  Social History Narrative  . Not on file   Additional  Social History:    History of alcohol / drug use?: Yes     Sleep: Fair  Appetite:  Fair  Current Medications: Current Facility-Administered Medications  Medication Dose Route Frequency Provider Last Rate Last Dose  . acetaminophen (TYLENOL) tablet 650 mg  650 mg Oral Q6H PRN Cristofano, Dorene Ar, MD   650 mg at 05/15/19 1439  . albuterol (VENTOLIN HFA) 108 (90 Base) MCG/ACT inhaler 2 puff  2 puff Inhalation Q6H PRN Cristofano, Paul A, MD      . alum & mag hydroxide-simeth (MAALOX/MYLANTA) 200-200-20 MG/5ML suspension 30 mL  30 mL Oral Q4H PRN Cristofano, Dorene Ar, MD      . aspirin EC tablet 81 mg  81 mg Oral Daily Cristofano, Dorene Ar, MD   81 mg at 05/18/19 5465  . carvedilol (COREG) tablet 6.25 mg  6.25 mg Oral BID  Cristofano, Dorene Ar, MD   6.25 mg at 05/18/19 6812  . clopidogrel (PLAVIX) tablet 75 mg  75 mg Oral Daily Cristofano, Dorene Ar, MD   75 mg at 05/18/19 7517  . divalproex (DEPAKOTE ER) 24 hr tablet 500 mg  500 mg Oral BID Cristofano, Dorene Ar, MD   500 mg at 05/18/19 0017  . escitalopram (LEXAPRO) tablet 10 mg  10 mg Oral QHS Cristofano, Dorene Ar, MD   10 mg at 05/17/19 2112  . influenza vac split quadrivalent PF (FLUARIX) injection 0.5 mL  0.5 mL Intramuscular Tomorrow-1000 Clapacs, John T, MD      . levothyroxine (SYNTHROID) tablet 25 mcg  25 mcg Oral Daily Cristofano, Dorene Ar, MD   25 mcg at 05/18/19 [phone]  . lisinopril (ZESTRIL) tablet 2.5 mg  2.5 mg Oral Daily Cristofano, Dorene Ar, MD   2.5 mg at 05/18/19 0811  . magnesium hydroxide (MILK OF MAGNESIA) suspension 30 mL  30 mL Oral Daily PRN Cristofano, Paul A, MD      . QUEtiapine (SEROQUEL) tablet 100 mg  100 mg Oral QHS Clapacs, John T, MD   100 mg at 05/17/19 2112  . QUEtiapine (SEROQUEL) tablet 50 mg  50 mg Oral Lavina Hamman A, RPH   50 mg at 05/18/19 9675  . QUEtiapine (SEROQUEL) tablet 50 mg  50 mg Oral Q1200 Rito Ehrlich A, RPH   50 mg at 05/18/19 1309  . tuberculin injection 5 Units  5 Units Intradermal Once Money, Lowry Ram, FNP   5 Units at 05/17/19 1638    Lab Results: No results found for this or any previous visit (from the past 48 hour(s)).  Blood Alcohol level:  Lab Results  Component Value Date   ETH <10 05/13/2019   ETH <10 91/63/8466    Metabolic Disorder Labs: Lab Results  Component Value Date   HGBA1C 5.6 11/03/2018   MPG 114.02 11/03/2018   No results found for: PROLACTIN Lab Results  Component Value Date   CHOL 192 05/15/2019   TRIG 281 (H) 05/15/2019   HDL 46 05/15/2019   CHOLHDL 4.2 05/15/2019   VLDL 56 (H) 05/15/2019   LDLCALC 90 05/15/2019   LDLCALC 104 (H) 12/23/2018    Physical Findings: AIMS: Facial and Oral Movements Muscles of Facial Expression: None, normal Lips and Perioral Area: None,  normal Jaw: None, normal Tongue: None, normal,Extremity Movements Upper (arms, wrists, hands, fingers): None, normal Lower (legs, knees, ankles, toes): None, normal, Trunk Movements Neck, shoulders, hips: None, normal, Overall Severity Severity of abnormal movements (highest score from questions above): None, normal Incapacitation due to abnormal movements:  None, normal Patient's awareness of abnormal movements (rate only patient's report): No Awareness, Dental Status Current problems with teeth and/or dentures?: Yes Does patient usually wear dentures?: No  CIWA:  CIWA-Ar Total: 1 COWS:  COWS Total Score: 0  Musculoskeletal: Strength & Muscle Tone: within normal limits Gait & Station: normal Patient leans: N/A  Psychiatric Specialty Exam: Physical Exam  Nursing note and vitals reviewed. Constitutional: He appears well-developed and well-nourished.  HENT:  Head: Normocephalic and atraumatic.  Eyes: Pupils are equal, round, and reactive to light. Conjunctivae are normal.  Neck: Normal range of motion.  Cardiovascular: Regular rhythm and normal heart sounds.  Respiratory: Effort normal.  GI: Soft.  Musculoskeletal: Normal range of motion.  Neurological: He is alert.  Skin: Skin is warm and dry.  Psychiatric: He has a normal mood and affect. His speech is normal and behavior is normal. Thought content normal. He expresses impulsivity. He exhibits abnormal recent memory.    ROS   Blood pressure 116/77, pulse 96, temperature 98.2 F (36.8 C), temperature source Oral, resp. rate 18, height 5\' 9"  (1.753 m), weight 78.9 kg, SpO2 100 %.Body mass index is 25.7 kg/m.  General Appearance: Casual  Eye Contact:  Good  Speech:  Clear and Coherent  Volume:  Normal  Mood:  Euthymic  Affect:  Congruent  Thought Process:  Goal Directed  Orientation:  Full (Time, Place, and Person)  Thought Content:  Logical  Suicidal Thoughts:  No  Homicidal Thoughts:  No  Memory:  Immediate;    Fair Recent;   Fair Remote;   Fair  Judgement:  Fair  Insight:  Fair  Psychomotor Activity:  Normal  Concentration:  Concentration: Fair  Recall:  of Knowledge:  Fair  Language:  Fair  Akathisia:  No  Handed:  Right  AIMS (if indicated):     Assets:  Desire for Improvement Physical Health Resilience  ADL's:  Intact  Cognition:  Impaired,  Mild  Sleep:  Number of Hours: 6.5     Treatment Plan Summary: No new changes to treatment at this time. WIll continue current treatment at this time.  Daily contact with patient to assess and evaluate symptoms and progress in treatment, Medication management and Plan Patient seems to be doing well on current mood stabilizing medicine.  No new complaints.  Adult Protective Services is working with social work and we hope we will be able to come up with a discharge plan before too long.  Reassured patient that we will keep working on trying to get him the food he desires.  No change to medicine.  [employer], FNP 05/18/2019, 4:07 PM

## 2019-05-18 NOTE — [facility] Group Notes (Signed)
Feelings Around Diagnosis 05/18/2019 1PM  Type of Therapy/Topic:  Group Therapy:  Feelings about Diagnosis  Participation Level:  Active   Description of Group:   This group will allow patients to explore their thoughts and feelings about diagnoses they have received. Patients will be guided to explore their level of understanding and acceptance of these diagnoses. Facilitator will encourage patients to process their thoughts and feelings about the reactions of others to their diagnosis and will guide patients in identifying ways to discuss their diagnosis with significant others in their lives. This group will be process-oriented, with patients participating in exploration of their own experiences, giving and receiving support, and processing challenge from other group members.   Therapeutic Goals: 1. Patient will demonstrate understanding of diagnosis as evidenced by identifying two or more symptoms of the disorder 2. Patient will be able to express two feelings regarding the diagnosis 3. Patient will demonstrate their ability to communicate their needs through discussion and/or role play  Summary of Patient Progress: Pt participated in todays session and discussed his relationship with his daughter and stepdaughter. Pt did not identify his diagnosis, unsure of his level of understanding and acceptance of his diagnosis. Pt respected boundaries during session.   Therapeutic Modalities:   Cognitive Behavioral Therapy Brief Therapy Feelings Identification    Yvette Rack, LCSW 05/18/2019 1:43 PM

## 2019-05-19 ENCOUNTER — Inpatient Hospital Stay: Payer: Medicaid Other

## 2019-05-19 NOTE — Progress Notes (Addendum)
Recreation Therapy Notes  Date: 05/19/2019  Time: 9:30 am  Location: Craft room  Behavioral response: Appropriate   Intervention Topic: Problem Solving  Discussion/Intervention:   Group content on today was focused on problem solving. The group described what problem solving is. Patients expressed how problems affect them and how they deal with problems. Individuals identified healthy ways to deal with problems. Patients explained what normally happens to them when they do not deal with problems. The group expressed reoccurring problems for them. The group participated in the intervention "Ways to Solve problems" where patients were given a chance to explore different ways to solve problems.  Clinical Observations/Feedback:  Patient came to group and was focused on what peers and staff had to say about problem solving. Individual was social with peers and staff while participating in group.  Ginevra Tacker LRT/CTRS         Lenell Lama 05/19/2019 10:58 AM

## 2019-05-19 NOTE — Progress Notes (Signed)
[facility]  05/19/2019 1:51 PM Carl [name]  MRN:  [id number]   Subjective: The follow-up on a patient diagnosed with bipolar 1 disorder.  Patient reports that he feels that he is doing pretty good today.  He states he has no complaints.  He denies any suicidal or homicidal ideations and denies any hallucinations.  He reports that he has been sleeping well and his appetite is good.  He then starts stating that he is still not getting all the food that he is requesting from the cafeteria and is continued to be focused on specific such as he did not get gravy on his mashed potatoes and that now he can have eggs and he is now allergic to them any longer.  Principal Problem: Bipolar 1 disorder (HCC) Diagnosis: Principal Problem:   Bipolar 1 disorder (HCC) Active Problems:   Coronary artery disease   Essential hypertension   History of stroke   Homicidal ideation   Hypothyroidism  Total Time spent with patient: 20 minutes  Past Psychiatric History: can only piece [facility]  Most of those old notes, as is typical for electronic medical records, contain a great deal of useless boiler plate and only scraps of useful clinical information.  It does sound however like this patient has had problems with mood and behavior going back to childhood.  Had seizures at least in childhood.  Has had several strokes.  Patient tells me he has never been in a psychiatric hospital before but I think that is probably untrue since it looks like the last time he was in our emergency room we referred him to geriatric psychiatry.  He denies ever having tried to kill himself in the past.  It sounds like anger problems and threats of violence are a common part of his issue.  Most common medicines I see over the course of the last few years are Depakote and Seroquel.  Medication management somewhat limited I assume by his heart disease and his long QT interval.  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 Acute pulmonary edema (HCC) 2017  . Acute respiratory failure with hypoxia (HCC) 2017  . Bipolar 1 disorder (HCC)   . COPD (chronic obstructive pulmonary disease) (HCC)   . Depression   . Hypertension   . Myocardial infarction (HCC)   . NSTEMI (non-ST elevated myocardial infarction) (HCC)   . 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 Seizures (HCC)    childhood  . Stroke (HCC)   . Tobacco abuse    History reviewed. No pertinent surgical history. Family History: History reviewed. No pertinent family history. Family Psychiatric  History: None known Social History:  Social History   Substance and Sexual Activity  Alcohol Use Not Currently     Social History   Substance and Sexual Activity  Drug Use Never    Social History   Socioeconomic History  . Marital status: Widowed    Spouse name: Not on file  . Number of children: Not on file  . Years of education: Not on file  . Highest education level: Not on file  Occupational History  . Not on file  Social Needs  . Financial resource strain: Not on file  . Food insecurity    Worry: Not on file    Inability: Not on file  .  Transportation needs    Medical: Not on file    Non-medical: Not on file  Tobacco Use  . Smoking status: Former Smoker    Packs/day: 0.00    Years: 0.00    Pack years: 0.00  . Smokeless tobacco: Never Used  Substance and Sexual Activity  . Alcohol use: Not Currently  . Drug use: Never  . Sexual activity: Not Currently    Birth control/protection: Abstinence  Lifestyle  . Physical activity    Days per week: Not on file    Minutes per session: Not on file  . Stress: Not on file  Relationships  . Social Herbalist on phone: Not on file    Gets together: Not on file    Attends religious service: Not on file    Active  member of club or organization: Not on file    Attends meetings of clubs or organizations: Not on file    Relationship status: Not on file  Other Topics Concern  . Not on file  Social History Narrative  . Not on file   Additional Social History:    History of alcohol / drug use?: Yes                    Sleep: Good  Appetite:  Good  Current Medications: Current Facility-Administered Medications  Medication Dose Route Frequency Provider Last Rate Last Dose  . acetaminophen (TYLENOL) tablet 650 mg  650 mg Oral Q6H PRN Cristofano, Dorene Ar, MD   650 mg at 05/15/19 1439  . albuterol (VENTOLIN HFA) 108 (90 Base) MCG/ACT inhaler 2 puff  2 puff Inhalation Q6H PRN Cristofano, Paul A, MD      . alum & mag hydroxide-simeth (MAALOX/MYLANTA) 200-200-20 MG/5ML suspension 30 mL  30 mL Oral Q4H PRN Cristofano, Dorene Ar, MD      . aspirin EC tablet 81 mg  81 mg Oral Daily Cristofano, Dorene Ar, MD   81 mg at 05/19/19 0814  . carvedilol (COREG) tablet 6.25 mg  6.25 mg Oral BID Cristofano, Dorene Ar, MD   6.25 mg at 05/19/19 0815  . clopidogrel (PLAVIX) tablet 75 mg  75 mg Oral Daily Cristofano, Dorene Ar, MD   75 mg at 05/19/19 0815  . divalproex (DEPAKOTE ER) 24 hr tablet 500 mg  500 mg Oral BID Cristofano, Dorene Ar, MD   500 mg at 05/19/19 0814  . escitalopram (LEXAPRO) tablet 10 mg  10 mg Oral QHS Cristofano, Dorene Ar, MD   10 mg at 05/18/19 2113  . influenza vac split quadrivalent PF (FLUARIX) injection 0.5 mL  0.5 mL Intramuscular Tomorrow-1000 Clapacs, John T, MD      . levothyroxine (SYNTHROID) tablet 25 mcg  25 mcg Oral Daily Cristofano, Dorene Ar, MD   25 mcg at 05/19/19 0646  . lisinopril (ZESTRIL) tablet 2.5 mg  2.5 mg Oral Daily Cristofano, Dorene Ar, MD   2.5 mg at 05/19/19 0815  . magnesium hydroxide (MILK OF MAGNESIA) suspension 30 mL  30 mL Oral Daily PRN Cristofano, Paul A, MD      . QUEtiapine (SEROQUEL) tablet 100 mg  100 mg Oral QHS Clapacs, John T, MD   100 mg at 05/18/19 2113  . QUEtiapine  (SEROQUEL) tablet 50 mg  50 mg Oral Lavina Hamman A, RPH   50 mg at 05/18/19 3295  . QUEtiapine (SEROQUEL) tablet 50 mg  50 mg Oral Q1200 Nazari, Walid A, RPH   50 mg at  05/19/19 1220  . tuberculin injection 5 Units  5 Units Intradermal Once Hermilo Dutter, Gerlene Burdock, FNP   5 Units at 05/17/19 1638    Lab Results: No results found for this or any previous visit (from the past 48 hour(s)).  Blood Alcohol level:  Lab Results  Component Value Date   ETH <10 05/13/2019   ETH <10 12/23/2018    Metabolic Disorder Labs: Lab Results  Component Value Date   HGBA1C 5.6 11/03/2018   MPG 114.02 11/03/2018   No results found for: PROLACTIN Lab Results  Component Value Date   CHOL 192 05/15/2019   TRIG 281 (H) 05/15/2019   HDL 46 05/15/2019   CHOLHDL 4.2 05/15/2019   VLDL 56 (H) 05/15/2019   LDLCALC 90 05/15/2019   LDLCALC 104 (H) 12/23/2018    Physical Findings: AIMS: Facial and Oral Movements Muscles of Facial Expression: None, normal Lips and Perioral Area: None, normal Jaw: None, normal Tongue: None, normal,Extremity Movements Upper (arms, wrists, hands, fingers): None, normal Lower (legs, knees, ankles, toes): None, normal, Trunk Movements Neck, shoulders, hips: None, normal, Overall Severity Severity of abnormal movements (highest score from questions above): None, normal Incapacitation due to abnormal movements: None, normal Patient's awareness of abnormal movements (rate only patient's report): No Awareness, Dental Status Current problems with teeth and/or dentures?: Yes Does patient usually wear dentures?: No  CIWA:  CIWA-Ar Total: 1 COWS:  COWS Total Score: 0  Musculoskeletal: Strength & Muscle Tone: within normal limits Gait & Station: normal Patient leans: N/A  Psychiatric Specialty Exam: Physical Exam  Nursing note and vitals reviewed. Constitutional: He is oriented to person, place, and time. He appears well-developed and well-nourished.  Cardiovascular: Normal  rate.  Respiratory: Effort normal.  Musculoskeletal: Normal range of motion.  Neurological: He is alert and oriented to person, place, and time.  Skin: Skin is warm.    Review of Systems  Constitutional: Negative.   HENT: Negative.   Eyes: Negative.   Respiratory: Negative.   Cardiovascular: Negative.   Gastrointestinal: Negative.   Genitourinary: Negative.   Musculoskeletal: Negative.   Skin: Negative.   Neurological: Negative.   Endo/Heme/Allergies: Negative.   Psychiatric/Behavioral: Negative.     Blood pressure 135/81, pulse 94, temperature 97.9 F (36.6 C), temperature source Oral, resp. rate 18, height 5\' 9"  (1.753 m), weight 78.9 kg, SpO2 98 %.Body mass index is 25.7 kg/m.  General Appearance: Casual  Eye Contact:  Good  Speech:  Clear and Coherent and Normal Rate  Volume:  Normal  Mood:  Euthymic  Affect:  Congruent  Thought Process:  Coherent and Descriptions of Associations: Intact  Orientation:  Full (Time, Place, and Person)  Thought Content:  WDL  Suicidal Thoughts:  No  Homicidal Thoughts:  No  Memory:  Immediate;   Good Recent;   Good Remote;   Good  Judgement:  Fair  Insight:  Fair  Psychomotor Activity:  Normal  Concentration:  Concentration: Good  Recall:  Good  Fund of Knowledge:  Good  Language:  Good  Akathisia:  No  Handed:  Right  AIMS (if indicated):     Assets:  Communication Skills Desire for Improvement Resilience Social Support  ADL's:  Intact  Cognition:  WNL  Sleep:  Number of Hours: 7.75   Assessment: Patient presents in the day room has been interacting with peers and staff properly.  Patient is pleasant, calm, cooperative.  We walked with his room to discuss today's events and he is very pleasant during his conversation.  He has remained focused on what he does and does not get on his meal trays.  However he is continued to deny any suicidal or homicidal ideations and denies any hallucinations.  He states that is his only concern  is where he is going to go after he is discharged from the hospital and has been told that they are working on group home placement for him.  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  Patient had reported to the RN that he had another positive test result in 1995.  Treatment Plan Summary: Daily contact with patient to assess and evaluate symptoms and progress in treatment and Medication management  Continue Depakote DR 500 mg p.o. twice daily for mood stability Continue Lexapro 10 mg p.o. nightly for depression and anxiety Continue Seroquel 100 mg p.o. nightly, 50 mg p.o. every morning and 50 mg at noon for mood stability Encourage group therapy participation Still awaiting APS assistance for placement Continue every 15 minute safety checks  Maryfrances Bunnell, FNP 05/19/2019, 1:51 PM

## 2019-05-19 NOTE — [facility] Group Notes (Signed)
[place] Group Notes:  (Nursing/MHT/Case Management/Adjunct)  Date:  05/19/2019  Time:  7:29 AM  Type of Therapy:  Group Therapy  Participation Level:  Active  Participation Quality:  Appropriate  Affect:  Appropriate  Cognitive:  Appropriate  Insight:  Good  Engagement in Group:  Engaged  Modes of Intervention:  Support  Summary of Progress/Problems:  Carl [name] 05/19/2019, 7:29 AM

## 2019-05-19 NOTE — Progress Notes (Signed)
Pt had a positive PPD. Dr. Weber Cooks was notified and orders out in. Collier Bullock RN

## 2019-05-19 NOTE — [facility] Group Notes (Signed)
LCSW Group Therapy Note  05/19/2019 1:49 PM  Type of Therapy/Topic:  Group Therapy:  Emotion Regulation  Participation Level:  Did Not Attend   Description of Group:   The purpose of this group is to assist patients in learning to regulate negative emotions and experience positive emotions. Patients will be guided to discuss ways in which they have been vulnerable to their negative emotions. These vulnerabilities will be juxtaposed with experiences of positive emotions or situations, and patients will be challenged to use positive emotions to combat negative ones. Special emphasis will be placed on coping with negative emotions in conflict situations, and patients will process healthy conflict resolution skills.  Therapeutic Goals: 1. Patient will identify two positive emotions or experiences to reflect on in order to balance out negative emotions 2. Patient will label two or more emotions that they find the most difficult to experience 3. Patient will demonstrate positive conflict resolution skills through discussion and/or role plays  Summary of Patient Progress: x   Therapeutic Modalities:   Cognitive Behavioral Therapy Feelings Identification Dialectical Behavioral Therapy   Evalina Field, MSW, LCSW Clinical Social Work 05/19/2019 1:49 PM

## 2019-05-19 NOTE — Plan of Care (Signed)
  Problem: Coping: Goal: Will verbalize feelings Outcome: Progressing  Patient verbalized feelings

## 2019-05-19 NOTE — Plan of Care (Signed)
Pt was rates depression and anxiety 2/10. Pt denies SI, HI and AVH. Pt was educated on care plan and verbalizes understanding. Collier Bullock RN Problem: Education: Goal: Utilization of techniques to improve thought processes will improve Outcome: Progressing Goal: Knowledge of the prescribed therapeutic regimen will improve Outcome: Progressing   Problem: Activity: Goal: Interest or engagement in leisure activities will improve Outcome: Progressing Goal: Imbalance in normal sleep/wake cycle will improve Outcome: Progressing   Problem: Coping: Goal: Coping ability will improve Outcome: Progressing Goal: Will verbalize feelings Outcome: Progressing   Problem: Health Behavior/Discharge Planning: Goal: Ability to make decisions will improve Outcome: Progressing Goal: Compliance with therapeutic regimen will improve Outcome: Progressing   Problem: Role Relationship: Goal: Will demonstrate positive changes in social behaviors and relationships Outcome: Progressing   Problem: Safety: Goal: Ability to disclose and discuss suicidal ideas will improve Outcome: Progressing Goal: Ability to identify and utilize support systems that promote safety will improve Outcome: Progressing   Problem: Self-Concept: Goal: Will verbalize positive feelings about self Outcome: Progressing Goal: Level of anxiety will decrease Outcome: Progressing   Problem: Consults Goal: Concurrent Medical Patient Education Description: (See Patient Education Module for education specifics) Outcome: Progressing   Problem: [facility] Concurrent Medical Problem Goal: LTG-Pt will be physically stable and he/significant other Description: (Patient will be physically stable and he/significant other will be able to verbalize understanding of follow-up care and symptoms that would warrant further treatment) Outcome: Progressing Goal: STG-Vital signs will be within defined limits or stabilized Description: (STG- Vital signs  will be within defined limits or stabilized for individual) Outcome: Progressing Goal: STG-Compliance with medication and/or treatment as ordered Description: (STG-Compliance with medication and/or treatment as ordered by MD) Outcome: Progressing Goal: STG-Verbalize two symptoms that would warrant further Description: (STG-Verbalize two symptoms that would warrant further treatment) Outcome: Progressing Goal: STG-Patient will participate in management/stabilization Description: (STG-Patient will participate in management/stabilization of medical condition) Outcome: Progressing Goal: STG-Other (Specify): Description: STG-Other Concurrent Medical (Specify): Outcome: Progressing

## 2019-05-20 NOTE — Plan of Care (Signed)
Patient compliant with medication administration per MD orders and procedures on the unit.   Problem: Education: Goal: Knowledge of the prescribed therapeutic regimen will improve Outcome: Progressing   

## 2019-05-20 NOTE — Plan of Care (Signed)
  Problem: Coping: Goal: Will verbalize feelings Outcome: Progressing  Patient verbalized feelings about his placement issues.

## 2019-05-20 NOTE — Progress Notes (Signed)
Recreation Therapy Notes   Date: 05/20/2019  Time: 9:30 am  Location: Craft room  Behavioral response: Appropriate   Intervention Topic: Emotions   Discussion/Intervention:   Group content on today was focused on emotions. The group identified what emotions are and why it is important to have emotions. Patients expressed some positive and negative emotions. Individuals gave some past experiences on how they normally dealt with emotions in the past. The group described some positive ways to deal with emotions in the future. Patients participated in the intervention "Name the Carl [name]" where individuals were given a chance to experience different emotions.  Clinical Observations/Feedback:  Patient came to group and defined emotions as the way you feel. Individual was social with peers and staff while participating in group.  Carl [name] LRT/CTRS         Nuh Lipton 05/20/2019 11:28 AM

## 2019-05-20 NOTE — Plan of Care (Signed)
D- Patient alert and oriented. Patient presents in a pleasant mood on assessment stating that he slept "good, sound" last night and he had no complaints or concerns to voice to this Probation officer. Patient denies depression/anxiety, however, he states "the only thing I have to worry about is APS finding me a place to go". Patient also denies SI, HI, AVH, and pain at this time. Patient's goal for today is "finding a home".  A- Scheduled medications administered to patient, per MD orders. Support and encouragement provided.  Routine safety checks conducted every 15 minutes.  Patient informed to notify staff with problems or concerns.  R- No adverse drug reactions noted. Patient contracts for safety at this time. Patient compliant with medications and treatment plan. Patient receptive, calm, and cooperative. Patient interacts well with others on the unit.  Patient remains safe at this time.  Problem: Education: Goal: Utilization of techniques to improve thought processes will improve Outcome: Progressing Goal: Knowledge of the prescribed therapeutic regimen will improve Outcome: Progressing   Problem: Activity: Goal: Interest or engagement in leisure activities will improve Outcome: Progressing Goal: Imbalance in normal sleep/wake cycle will improve Outcome: Progressing   Problem: Coping: Goal: Coping ability will improve Outcome: Progressing Goal: Will verbalize feelings Outcome: Progressing   Problem: Health Behavior/Discharge Planning: Goal: Ability to make decisions will improve Outcome: Progressing Goal: Compliance with therapeutic regimen will improve Outcome: Progressing   Problem: Role Relationship: Goal: Will demonstrate positive changes in social behaviors and relationships Outcome: Progressing   Problem: Safety: Goal: Ability to disclose and discuss suicidal ideas will improve Outcome: Progressing Goal: Ability to identify and utilize support systems that promote safety will  improve Outcome: Progressing   Problem: Self-Concept: Goal: Will verbalize positive feelings about self Outcome: Progressing Goal: Level of anxiety will decrease Outcome: Progressing   Problem: Consults Goal: Concurrent Medical Patient Education Description: (See Patient Education Module for education specifics) Outcome: Progressing   Problem: [facility] Concurrent Medical Problem Goal: LTG-Pt will be physically stable and he/significant other Description: (Patient will be physically stable and he/significant other will be able to verbalize understanding of follow-up care and symptoms that would warrant further treatment) Outcome: Progressing Goal: STG-Vital signs will be within defined limits or stabilized Description: (STG- Vital signs will be within defined limits or stabilized for individual) Outcome: Progressing Goal: STG-Compliance with medication and/or treatment as ordered Description: (STG-Compliance with medication and/or treatment as ordered by MD) Outcome: Progressing Goal: STG-Verbalize two symptoms that would warrant further Description: (STG-Verbalize two symptoms that would warrant further treatment) Outcome: Progressing Goal: STG-Patient will participate in management/stabilization Description: (STG-Patient will participate in management/stabilization of medical condition) Outcome: Progressing Goal: STG-Other (Specify): Description: STG-Other Concurrent Medical (Specify): Outcome: Progressing

## 2019-05-20 NOTE — [facility] Counselor (Signed)
CSW received phone call from [place] worker) who reports she has found housing for the pt at [facility] in [place]. She says he cannot be admitted until Monday(11/16). CSW informed physician.

## 2019-05-20 NOTE — Progress Notes (Signed)
[facility] MD Progress Note  05/20/2019 11:40 AM Carl [name]  MRN:  [id number]   Subjective: The follow-up on a patient diagnosed with bipolar 1 disorder.  Patient states that he is doing good today.  Patient denies having any complaints.  Patient denies any suicidal or homicidal ideations and denies any hallucinations.  Patient does report that he is hoping to find out a place that he is going to stay and then states that he found out that his social worker with [facility] is out of the office until the 15th.  Patient also reports that he would like more information on various mental health diagnoses because she would like to read up on them so that in case he has symptoms of them that he can recognize it before he gets bad.  Patient shows me that he has [facility]AMI literature on anxiety, depression, and psychosis.  Principal Problem: Bipolar 1 disorder (HCC) Diagnosis: Principal Problem:   Bipolar 1 disorder (HCC) Active Problems:   Coronary artery disease   Essential hypertension   History of stroke   Homicidal ideation   Hypothyroidism  Total Time spent with patient: 20 minutes  Past Psychiatric History: can only piece things together through some of the old notes from [facility].  Most of those old notes, as is typical for electronic medical records, contain a great deal of useless boiler plate and only scraps of useful clinical information.  It does sound however like this patient has had problems with mood and behavior going back to childhood.  Had seizures at least in childhood.  Has had several strokes.  Patient tells me he has never been in a psychiatric hospital before but I think that is probably untrue since it looks like the last time he was in our emergency room we referred him to geriatric psychiatry.  He denies ever having tried to kill himself in the past.  It sounds like anger problems and threats of violence are a common part of his issue.  Most common medicines I see over the course of the last few  years are Depakote and Seroquel.  Medication management somewhat limited I assume by his heart disease and his long QT interval.  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 Acute pulmonary edema (HCC) 2017  . Acute respiratory failure with hypoxia (HCC) 2017  . Bipolar 1 disorder (HCC)   . COPD (chronic obstructive pulmonary disease) (HCC)   . Depression   . Hypertension   . Myocardial infarction (HCC)   . NSTEMI (non-ST elevated myocardial infarction) (HCC)   . 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 Seizures (HCC)    childhood  . Stroke (HCC)   . Tobacco abuse    History reviewed. No pertinent surgical history. Family History: History reviewed. No pertinent family history. Family Psychiatric  History: None known Social History:  Social History   Substance and Sexual Activity  Alcohol Use Not Currently     Social History   Substance and Sexual Activity  Drug Use Never    Social History   Socioeconomic History  . Marital status: Widowed    Spouse name: Not on file  . Number of children: Not on file  . Years of education: Not on file  . Highest education level: Not on file  Occupational History  . Not on file  Social Needs  . Financial resource strain:  Not on file  . Food insecurity    Worry: Not on file    Inability: Not on file  . Transportation needs    Medical: Not on file    Non-medical: Not on file  Tobacco Use  . Smoking status: Former Smoker    Packs/day: 0.00    Years: 0.00    Pack years: 0.00  . Smokeless tobacco: Never Used  Substance and Sexual Activity  . Alcohol use: Not Currently  . Drug use: Never  . Sexual activity: Not Currently    Birth control/protection: Abstinence  Lifestyle  . Physical activity    Days per week: Not on file    Minutes per session: Not on file  . Stress: Not on file  Relationships   . Social Musicianconnections    Talks on phone: Not on file    Gets together: Not on file    Attends religious service: Not on file    Active member of club or organization: Not on file    Attends meetings of clubs or organizations: Not on file    Relationship status: Not on file  Other Topics Concern  . Not on file  Social History Narrative  . Not on file   Additional Social History:    History of alcohol / drug use?: Yes                    Sleep: Good  Appetite:  Good  Current Medications: Current Facility-Administered Medications  Medication Dose Route Frequency Provider Last Rate Last Dose  . acetaminophen (TYLENOL) tablet 650 mg  650 mg Oral Q6H PRN Cristofano, Worthy RancherPaul A, MD   650 mg at 05/19/19 2114  . albuterol (VENTOLIN HFA) 108 (90 Base) MCG/ACT inhaler 2 puff  2 puff Inhalation Q6H PRN Cristofano, Paul A, MD      . alum & mag hydroxide-simeth (MAALOX/MYLANTA) 200-200-20 MG/5ML suspension 30 mL  30 mL Oral Q4H PRN Cristofano, Worthy RancherPaul A, MD      . aspirin EC tablet 81 mg  81 mg Oral Daily Cristofano, Worthy RancherPaul A, MD   81 mg at 05/20/19 0803  . carvedilol (COREG) tablet 6.25 mg  6.25 mg Oral BID Cristofano, Worthy RancherPaul A, MD   6.25 mg at 05/20/19 0803  . clopidogrel (PLAVIX) tablet 75 mg  75 mg Oral Daily Cristofano, Worthy RancherPaul A, MD   75 mg at 05/20/19 0803  . divalproex (DEPAKOTE ER) 24 hr tablet 500 mg  500 mg Oral BID Cristofano, Worthy RancherPaul A, MD   500 mg at 05/20/19 0803  . escitalopram (LEXAPRO) tablet 10 mg  10 mg Oral QHS Cristofano, Worthy RancherPaul A, MD   10 mg at 05/19/19 2114  . influenza vac split quadrivalent PF (FLUARIX) injection 0.5 mL  0.5 mL Intramuscular Tomorrow-1000 Clapacs, John T, MD      . levothyroxine (SYNTHROID) tablet 25 mcg  25 mcg Oral Daily Cristofano, Worthy RancherPaul A, MD   25 mcg at 05/20/19 [phone]  . lisinopril (ZESTRIL) tablet 2.5 mg  2.5 mg Oral Daily Cristofano, Worthy RancherPaul A, MD   2.5 mg at 05/20/19 0803  . magnesium hydroxide (MILK OF MAGNESIA) suspension 30 mL  30 mL Oral Daily PRN  Cristofano, Paul A, MD      . QUEtiapine (SEROQUEL) tablet 100 mg  100 mg Oral QHS Clapacs, John T, MD   100 mg at 05/19/19 2115  . QUEtiapine (SEROQUEL) tablet 50 mg  50 mg Oral BH-q7a Nazari, Walid A, RPH   50 mg at  05/20/19 6967  . QUEtiapine (SEROQUEL) tablet 50 mg  50 mg Oral Q1200 Rito Ehrlich A, RPH   50 mg at 05/19/19 1220    Lab Results: No results found for this or any previous visit (from the past 48 hour(s)).  Blood Alcohol level:  Lab Results  Component Value Date   ETH <10 05/13/2019   ETH <10 89/38/1017    Metabolic Disorder Labs: Lab Results  Component Value Date   HGBA1C 5.6 11/03/2018   MPG 114.02 11/03/2018   No results found for: PROLACTIN Lab Results  Component Value Date   CHOL 192 05/15/2019   TRIG 281 (H) 05/15/2019   HDL 46 05/15/2019   CHOLHDL 4.2 05/15/2019   VLDL 56 (H) 05/15/2019   LDLCALC 90 05/15/2019   LDLCALC 104 (H) 12/23/2018    Physical Findings: AIMS: Facial and Oral Movements Muscles of Facial Expression: None, normal Lips and Perioral Area: None, normal Jaw: None, normal Tongue: None, normal,Extremity Movements Upper (arms, wrists, hands, fingers): None, normal Lower (legs, knees, ankles, toes): None, normal, Trunk Movements Neck, shoulders, hips: None, normal, Overall Severity Severity of abnormal movements (highest score from questions above): None, normal Incapacitation due to abnormal movements: None, normal Patient's awareness of abnormal movements (rate only patient's report): No Awareness, Dental Status Current problems with teeth and/or dentures?: Yes Does patient usually wear dentures?: No  CIWA:  CIWA-Ar Total: 1 COWS:  COWS Total Score: 0  Musculoskeletal: Strength & Muscle Tone: within normal limits Gait & Station: normal Patient leans: N/A  Psychiatric Specialty Exam: Physical Exam  Nursing note and vitals reviewed. Constitutional: He is oriented to person, place, and time. He appears well-developed and  well-nourished.  Cardiovascular: Normal rate.  Respiratory: Effort normal.  Musculoskeletal: Normal range of motion.  Neurological: He is alert and oriented to person, place, and time.  Skin: Skin is warm.    Review of Systems  Constitutional: Negative.   HENT: Negative.   Eyes: Negative.   Respiratory: Negative.   Cardiovascular: Negative.   Gastrointestinal: Negative.   Genitourinary: Negative.   Musculoskeletal: Negative.   Skin: Negative.   Neurological: Negative.   Endo/Heme/Allergies: Negative.   Psychiatric/Behavioral: Negative.     Blood pressure 118/83, pulse 94, temperature 98.4 F (36.9 C), temperature source Oral, resp. rate 15, height 5\' 9"  (1.753 m), weight 78.9 kg, SpO2 100 %.Body mass index is 25.7 kg/m.  General Appearance: Casual  Eye Contact:  Good  Speech:  Clear and Coherent and Normal Rate  Volume:  Normal  Mood:  Euthymic  Affect:  Congruent  Thought Process:  Coherent and Descriptions of Associations: Intact  Orientation:  Full (Time, Place, and Person)  Thought Content:  WDL  Suicidal Thoughts:  No  Homicidal Thoughts:  No  Memory:  Immediate;   Good Recent;   Good Remote;   Good  Judgement:  Fair  Insight:  Fair  Psychomotor Activity:  Normal  Concentration:  Concentration: Good  Recall:  Good  Fund of Knowledge:  Good  Language:  Good  Akathisia:  No  Handed:  Right  AIMS (if indicated):     Assets:  Communication Skills Desire for Improvement Resilience Social Support  ADL's:  Intact  Cognition:  WNL  Sleep:  Number of Hours: 6.5   Assessment: Patient presents in his room lying in the bed but is awake.  Patient is pleasant, calm, and cooperative.  Unsure if patient is attempting to try to stay longer about telling me that his caseworker with  [facility] is out of the office but was told by the CSW that that spoken with his [facility] case worker this morning.  He was concerned for the patient asking for additional literature on various mental  health disorders and will need to learn about it as if the patient may be attempting to learn the signs and symptoms that he needs to protracted for these disorders.  Still waiting for [facility] to find placement for this patient as he has been financially cleared.  Patient had chest x-ray which report does not inhale any type of tuberculosis signs or symptoms.  I am still waiting on the results of the QuantiFERON TB Gold lab  Treatment Plan Summary: Daily contact with patient to assess and evaluate symptoms and progress in treatment and Medication management  Continue Depakote DR 500 mg p.o. twice daily for mood stability Continue Lexapro 10 mg p.o. nightly for depression and anxiety Continue Seroquel 100 mg p.o. nightly, 50 mg p.o. every morning and 50 mg at noon for mood stability Encourage group therapy participation Still awaiting [facility]PS assistance for placement Continue every 15 minute safety checks  Maryfrances Bunnell, FNP 05/20/2019, 11:40 AM

## 2019-05-20 NOTE — [facility] Group Notes (Signed)
LCSW Group Therapy Note  05/20/2019 1:00 PM  Type of Therapy/Topic:  Group Therapy:  Balance in Life  Participation Level:  Active  Description of Group:    This group will address the concept of balance and how it feels and looks when one is unbalanced. Patients will be encouraged to process areas in their lives that are out of balance and identify reasons for remaining unbalanced. Facilitators will guide patients in utilizing problem-solving interventions to address and correct the stressor making their life unbalanced. Understanding and applying boundaries will be explored and addressed for obtaining and maintaining a balanced life. Patients will be encouraged to explore ways to assertively make their unbalanced needs known to significant others in their lives, using other group members and facilitator for support and feedback.  Therapeutic Goals: 1. Patient will identify two or more emotions or situations they have that consume much of in their lives. 2. Patient will identify signs/triggers that life has become out of balance:  3. Patient will identify two ways to set boundaries in order to achieve balance in their lives:  4. Patient will demonstrate ability to communicate their needs through discussion and/or role plays  Summary of Patient Progress: Patient was present and active in group.  Patient shared what wellness and health means to him.  Patient was supportive of other group members.  Patient participated in the Wellness wheel activity.    Therapeutic Modalities:   Cognitive Behavioral Therapy Solution-Focused Therapy Assertiveness Training  Assunta Curtis MSW, LCSW 05/20/2019 2:24 PM

## 2019-05-20 NOTE — Tx Team (Signed)
Interdisciplinary Treatment and Diagnostic Plan Update  05/20/2019 Time of Session: 8:30AM Carl [name] MRN: [id number]  Principal Diagnosis: Bipolar 1 disorder ([place])  Secondary Diagnoses: Principal Problem:   Bipolar 1 disorder ([place]) Active Problems:   Coronary artery disease   Essential hypertension   History of stroke   Homicidal ideation   Hypothyroidism   Current Medications:  Current Facility-Administered Medications  Medication Dose Route Frequency Provider Last Rate Last Dose  . acetaminophen (TYLENOL) tablet 650 mg  650 mg Oral Q6H PRN Cristofano, Dorene Ar, MD   650 mg at 05/19/19 2114  . albuterol (VENTOLIN HFA) 108 (90 Base) MCG/ACT inhaler 2 puff  2 puff Inhalation Q6H PRN Cristofano, Paul A, MD      . alum & mag hydroxide-simeth (MAALOX/MYLANTA) 200-200-20 MG/5ML suspension 30 mL  30 mL Oral Q4H PRN Cristofano, Dorene Ar, MD      . aspirin EC tablet 81 mg  81 mg Oral Daily Cristofano, Dorene Ar, MD   81 mg at 05/20/19 0803  . carvedilol (COREG) tablet 6.25 mg  6.25 mg Oral BID Cristofano, Dorene Ar, MD   6.25 mg at 05/20/19 0803  . clopidogrel (PLAVIX) tablet 75 mg  75 mg Oral Daily Cristofano, Dorene Ar, MD   75 mg at 05/20/19 0803  . divalproex (DEPAKOTE ER) 24 hr tablet 500 mg  500 mg Oral BID Cristofano, Dorene Ar, MD   500 mg at 05/20/19 0803  . escitalopram (LEXAPRO) tablet 10 mg  10 mg Oral QHS Cristofano, Dorene Ar, MD   10 mg at 05/19/19 2114  . influenza vac split quadrivalent PF (FLUARIX) injection 0.5 mL  0.5 mL Intramuscular Tomorrow-1000 Clapacs, John T, MD      . levothyroxine (SYNTHROID) tablet 25 mcg  25 mcg Oral Daily Cristofano, Dorene Ar, MD   25 mcg at 05/20/19 [phone]  . lisinopril (ZESTRIL) tablet 2.5 mg  2.5 mg Oral Daily Cristofano, Dorene Ar, MD   2.5 mg at 05/20/19 0803  . magnesium hydroxide (MILK OF MAGNESIA) suspension 30 mL  30 mL Oral Daily PRN Cristofano, Paul A, MD      . QUEtiapine (SEROQUEL) tablet 100 mg  100 mg Oral QHS Clapacs, John T, MD   100 mg at 05/19/19  2115  . QUEtiapine (SEROQUEL) tablet 50 mg  50 mg Oral Beryle Beams, Walid A, RPH   50 mg at 05/20/19 6979  . QUEtiapine (SEROQUEL) tablet 50 mg  50 mg Oral Q1200 Rito Ehrlich A, RPH   50 mg at 05/19/19 1220   PTA Medications: Medications Prior to Admission  Medication Sig Dispense Refill Last Dose  . albuterol (PROVENTIL) (2.5 MG/3ML) 0.083% nebulizer solution Take 2.5 mg by nebulization every 6 (six) hours as needed for wheezing or shortness of breath.     [name] albuterol (VENTOLIN HFA) 108 (90 Base) MCG/ACT inhaler Inhale 2 puffs into the lungs every 6 (six) hours as needed for wheezing.     [name] aspirin EC 81 MG tablet Take 81 mg by mouth daily.     [name] atorvastatin (LIPITOR) 80 MG tablet Take 1 tablet by mouth daily.     . carvedilol (COREG) 6.25 MG tablet Take 1 tablet by mouth 2 (two) times daily.     . clopidogrel (PLAVIX) 75 MG tablet Take 1 tablet by mouth daily.     . divalproex (DEPAKOTE ER) 500 MG 24 hr tablet Take 1 tablet by mouth 2 (two) times daily.     [name] escitalopram (LEXAPRO) 10 MG tablet  Take 1 tablet by mouth at bedtime.     [name]. levothyroxine (SYNTHROID) 25 MCG tablet Take 1 tablet by mouth daily.     [name]. lisinopril (ZESTRIL) 2.5 MG tablet Take 1 tablet by mouth daily.     . Multiple Vitamin (MULTI-VITAMIN) tablet Take 1 tablet by mouth daily.     . nicotine (NICODERM CQ - DOSED IN MG/24 HOURS) 14 mg/24hr patch 14 mg patch chest wall daily (may substitute generic) 30 patch 0   . polyethylene glycol (MIRALAX / GLYCOLAX) 17 g packet Take 17 g by mouth daily as needed for moderate constipation. 14 each 0   . QUEtiapine (SEROQUEL) 25 MG tablet Take by mouth. Take 2 tablets (50 mg total) by mouth every morning, 2 tablets at lunch, and 3 tablets (75 mg total) nightly     . Tiotropium Bromide Monohydrate (SPIRIVA RESPIMAT) 2.5 MCG/ACT AERS Inhale 2 puffs into the lungs daily.     . traZODone (DESYREL) 100 MG tablet Take 1 tablet by mouth at bedtime.     . vitamin B-12 (CYANOCOBALAMIN) 1000  MCG tablet Take 1 tablet by mouth daily.       Patient Stressors: Health problems Marital or family conflict Traumatic event  Patient Strengths: Ability for insight Average or above average intelligence Capable of independent living Motivation for treatment/growth  Treatment Modalities: Medication Management, Group therapy, Case management,  1 to 1 session with clinician, Psychoeducation, Recreational therapy.   Physician Treatment Plan for Primary Diagnosis: Bipolar 1 disorder (HCC) Long Term Goal(s): Improvement in symptoms so as ready for discharge Improvement in symptoms so as ready for discharge   Short Term Goals: Ability to verbalize feelings will improve Ability to demonstrate self-control will improve Ability to identify and develop effective coping behaviors will improve Ability to maintain clinical measurements within normal limits will improve Compliance with prescribed medications will improve  Medication Management: Evaluate patient's response, side effects, and tolerance of medication regimen.  Therapeutic Interventions: 1 to 1 sessions, Unit Group sessions and Medication administration.  Evaluation of Outcomes: Progressing  Physician Treatment Plan for Secondary Diagnosis: Principal Problem:   Bipolar 1 disorder (HCC) Active Problems:   Coronary artery disease   Essential hypertension   History of stroke   Homicidal ideation   Hypothyroidism  Long Term Goal(s): Improvement in symptoms so as ready for discharge Improvement in symptoms so as ready for discharge   Short Term Goals: Ability to verbalize feelings will improve Ability to demonstrate self-control will improve Ability to identify and develop effective coping behaviors will improve Ability to maintain clinical measurements within normal limits will improve Compliance with prescribed medications will improve     Medication Management: Evaluate patient's response, side effects, and tolerance of  medication regimen.  Therapeutic Interventions: 1 to 1 sessions, Unit Group sessions and Medication administration.  Evaluation of Outcomes: Progressing   RN Treatment Plan for Primary Diagnosis: Bipolar 1 disorder (HCC) Long Term Goal(s): Knowledge of disease and therapeutic regimen to maintain health will improve  Short Term Goals: Ability to remain free from injury will improve, Ability to verbalize frustration and anger appropriately will improve, Ability to demonstrate self-control, Ability to participate in decision making will improve, Ability to verbalize feelings will improve, Ability to disclose and discuss suicidal ideas, Ability to identify and develop effective coping behaviors will improve and Compliance with prescribed medications will improve  Medication Management: RN will administer medications as ordered by provider, will assess and evaluate patient's response and provide education to patient for  prescribed medication. RN will report any adverse and/or side effects to prescribing provider.  Therapeutic Interventions: 1 on 1 counseling sessions, Psychoeducation, Medication administration, Evaluate responses to treatment, Monitor vital signs and CBGs as ordered, Perform/monitor CIWA, COWS, AIMS and Fall Risk screenings as ordered, Perform wound care treatments as ordered.  Evaluation of Outcomes: Progressing   LCSW Treatment Plan for Primary Diagnosis: Bipolar 1 disorder (HCC) Long Term Goal(s): Safe transition to appropriate next level of care at discharge, Engage patient in therapeutic group addressing interpersonal concerns.  Short Term Goals: Engage patient in aftercare planning with referrals and resources, Increase social support, Increase ability to appropriately verbalize feelings, Increase emotional regulation, Facilitate acceptance of mental health diagnosis and concerns, Facilitate patient progression through stages of change regarding substance use diagnoses and  concerns, Identify triggers associated with mental health/substance abuse issues and Increase skills for wellness and recovery  Therapeutic Interventions: Assess for all discharge needs, 1 to 1 time with Social worker, Explore available resources and support systems, Assess for adequacy in community support network, Educate family and significant other(s) on suicide prevention, Complete Psychosocial Assessment, Interpersonal group therapy.  Evaluation of Outcomes: Progressing   Progress in Treatment: Attending groups: Yes. Participating in groups: Yes. Taking medication as prescribed: Yes. Toleration medication: Yes. Family/Significant other contact made: No, will contact:  when pt gives permission Patient understands diagnosis: Yes. Discussing patient identified problems/goals with staff: Yes. Medical problems stabilized or resolved: Yes. Denies suicidal/homicidal ideation:Yes Issues/concerns per patient self-inventory: No. Other: NA  New problem(s) identified: No, Describe:  None  New Short Term/Long Term Goal(s):  Engage patient in aftercare planning with referrals and resources, Increase social support, Increase emotional regulation, Facilitate acceptance of mental health diagnosis and concerns, Increase medication compliance and Increase skills for wellness and recovery  Patient Goals:  "to work with adult services to deal with the garbage from my other daughter"  Discharge Plan or Barriers: Patient will not return back to the home with his daughter after discharge. He is working with APS in an effort to secure placement. Update 05/20/19- Pt is involved with [place]  [facility] and they are planning to file protective order to take custody of the patient. They will also be assisting him with finding housing.   Reason for Continuation of Hospitalization: Homicidal ideation Other; describe Medication non-compliance  Estimated Length of Stay: TBD  Attendees: Patient:   05/20/2019  9:48 AM  Physician: Dr. Toni Amend 05/20/2019 9:48 AM  Nursing: Mickeal Needy [place] 05/20/2019 9:48 AM  RN Care Manager: 05/20/2019 9:48 AM  Social Worker: Suzan Slick, LCSW Terri Piedra 05/20/2019 9:48 AM  Recreational Therapist: Garret Reddish 05/20/2019 9:48 AM  Other:  05/20/2019 9:48 AM  Other:  05/20/2019 9:48 AM  Other: 05/20/2019 9:48 AM    Scribe for Treatment Team: Roselyn Bering, MSW, LCSW Clinical Social Work 05/20/2019 9:48 AM

## 2019-05-20 NOTE — Progress Notes (Signed)
Patient alert and oriented x 4, affect is flat but he brightens upon approach, interacting appropriately with peers and staff, no distress, noted, she appaers less anxious, compliant with medication regimen, was offered emotional support, 15 minutes safety checks maintained will continue to monitor .

## 2019-05-20 NOTE — Progress Notes (Signed)
D - Patient was in the day room upon arrival to the unit. Patient was pleasant during assessment and denies SI/HI/AVH and pain. Patient endorses anxiety and depression. Patient observed interacting appropriately with staff and peers on the unit.   A - Patient compliant with medication administration per MD orders. Patient given education. Patient given support and encouragement to be active in his treatment plan. Patient informed to let staff know if there are any issues or problems on the unit.   R - Patient being monitored Q 15 minutes for safety per unit protocol. Patient remains safe on the unit.  

## 2019-05-21 NOTE — [facility] Suicide Risk Assessment (Signed)
[facility] INPATIENT:  Family/Significant Other Suicide Prevention Education  Suicide Prevention Education:  Education Completed; Lydia Guiles legal guardian [facility] [id number] has been identified by the patient as the family member/significant other with whom the patient will be residing, and identified as the person(s) who will aid the patient in the event of a mental health crisis (suicidal ideations/suicide attempt).  With written consent from the patient, the family member/significant other has been provided the following suicide prevention education, prior to the and/or following the discharge of the patient.  The suicide prevention education provided includes the following:  Suicide risk factors  Suicide prevention and interventions  National Suicide Hotline telephone number  [facility] assessment telephone number  [facility] Emergency Assistance [place] and/or Residential Mobile Crisis Unit telephone number  Request made of family/significant other to:  Remove weapons (e.g., guns, rifles, knives), all items previously/currently identified as safety concern.    Remove drugs/medications (over-the-counter, prescriptions, illicit drugs), all items previously/currently identified as a safety concern.  The family member/significant other verbalizes understanding of the suicide prevention education information provided.  The family member/significant other agrees to remove the items of safety concern listed above. Ms. Roxy Manns reports [facility] DSS assumed guardianship for the pt on 11/12. She states she has secured housing for him and [facility]. In addition, she reports the pt is being referred to doctors making house calls for his outpatient mental health treatment, where medical providers will be able to provide treatment in the group home.  Oneal Schoenberger T Alyscia Carmon 05/21/2019, 11:26 AM

## 2019-05-21 NOTE — [facility] Group Notes (Signed)
Feelings Around Relapse 05/21/2019 1PM  Type of Therapy and Topic:  Group Therapy:  Feelings around Relapse and Recovery  Participation Level:  Minimal   Description of Group:    Patients in this group will discuss emotions they experience before and after a relapse. They will process how experiencing these feelings, or avoidance of experiencing them, relates to having a relapse. Facilitator will guide patients to explore emotions they have related to recovery. Patients will be encouraged to process which emotions are more powerful. They will be guided to discuss the emotional reaction significant others in their lives may have to patients' relapse or recovery. Patients will be assisted in exploring ways to respond to the emotions of others without this contributing to a relapse.  Therapeutic Goals: 1. Patient will identify two or more emotions that lead to a relapse for them 2. Patient will identify two emotions that result when they relapse 3. Patient will identify two emotions related to recovery 4. Patient will demonstrate ability to communicate their needs through discussion and/or role plays   Summary of Patient Progress:  Minimal participation during session. Pt sat quietly but did complete the assignment on relapse prevention. Minimal input provided.   Therapeutic Modalities:   Cognitive Behavioral Therapy Solution-Focused Therapy Assertiveness Training Relapse Prevention Therapy   Yvette Rack, LCSW 05/21/2019 2:09 PM

## 2019-05-21 NOTE — Progress Notes (Signed)
Recreation Therapy Notes    Date: 05/21/2019  Time: 9:30 am  Location: Craft room  Behavioral response: Appropriate   Intervention Topic: Self-esteem   Discussion/Intervention:   Group content today was focused on self-esteem. Patient defined self-esteem and where it comes form. The group described reasons self-esteem is important. Individuals stated things that impact self-esteem and positive ways to improve self-esteem. The group participated in the intervention "Exploring self-esteem" where patients were able to create a collage of positive things that makes them who they are. Clinical Observations/Feedback:  Patient came to group and stated self-esteem is control of a situation. Individual was social with peers and staff while participating in group.  Tayloranne Lekas LRT/CTRS         Marquite Attwood 05/21/2019 11:33 AM

## 2019-05-21 NOTE — Progress Notes (Signed)
[facility] MD Progress Note  05/21/2019 2:33 PM Carl [name]  MRN:  [id number] Subjective: Follow-up for this patient with a history of bipolar disorder.  Patient has no new complaints.  He has had no behavior problems.  He is calm and cooperative and appears to be lucid.  He is aware that he has a guardianship hearing next week and he is aware that he is likely to be discharged early next week Principal Problem: Bipolar 1 disorder (HCC) Diagnosis: Principal Problem:   Bipolar 1 disorder (HCC) Active Problems:   Coronary artery disease   Essential hypertension   History of stroke   Homicidal ideation   Hypothyroidism  Total Time spent with patient: 15 minutes  Past Psychiatric History: Long history of anger problems  Past Medical History:  Past Medical History:  Diagnosis Date  . Acute pulmonary edema (HCC) 2017  . Acute respiratory failure with hypoxia (HCC) 2017  . Bipolar 1 disorder (HCC)   . COPD (chronic obstructive pulmonary disease) (HCC)   . Depression   . Hypertension   . Myocardial infarction (HCC)   . NSTEMI (non-ST elevated myocardial infarction) (HCC)   . 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 Seizures (HCC)    childhood  . Stroke (HCC)   . Tobacco abuse    History reviewed. No pertinent surgical history. Family History: History reviewed. No pertinent family history. Family Psychiatric  History: See previous Social History:  Social History   Substance and Sexual Activity  Alcohol Use Not Currently     Social History   Substance and Sexual Activity  Drug Use Never    Social History   Socioeconomic History  . Marital status: Widowed    Spouse name: Not on file  . Number of children: Not on file  . Years of education: Not on file  . Highest education level: Not on file  Occupational History  . Not on file  Social Needs  . Financial resource strain: Not on file  . Food insecurity    Worry: Not on file    Inability: Not on  file  . Transportation needs    Medical: Not on file    Non-medical: Not on file  Tobacco Use  . Smoking status: Former Smoker    Packs/day: 0.00    Years: 0.00    Pack years: 0.00  . Smokeless tobacco: Never Used  Substance and Sexual Activity  . Alcohol use: Not Currently  . Drug use: Never  . Sexual activity: Not Currently    Birth control/protection: Abstinence  Lifestyle  . Physical activity    Days per week: Not on file    Minutes per session: Not on file  . Stress: Not on file  Relationships  . Social Musician on phone: Not on file    Gets together: Not on file    Attends religious service: Not on file    Active member of club or organization: Not on file    Attends meetings of clubs or organizations: Not on file    Relationship status: Not on file  Other Topics Concern  . Not on file  Social History Narrative  . Not on file   Additional Social History:    History of alcohol / drug use?: Yes                    Sleep: Fair  Appetite:  Fair  Current Medications: Current Facility-Administered Medications  Medication Dose  Route Frequency Provider Last Rate Last Dose  . acetaminophen (TYLENOL) tablet 650 mg  650 mg Oral Q6H PRN Cristofano, Worthy RancherPaul A, MD   650 mg at 05/21/19 1029  . albuterol (VENTOLIN HFA) 108 (90 Base) MCG/ACT inhaler 2 puff  2 puff Inhalation Q6H PRN Cristofano, Worthy RancherPaul A, MD      . alum & mag hydroxide-simeth (MAALOX/MYLANTA) 200-200-20 MG/5ML suspension 30 mL  30 mL Oral Q4H PRN Cristofano, Worthy RancherPaul A, MD      . aspirin EC tablet 81 mg  81 mg Oral Daily Cristofano, Worthy RancherPaul A, MD   81 mg at 05/21/19 0807  . carvedilol (COREG) tablet 6.25 mg  6.25 mg Oral BID Cristofano, Worthy RancherPaul A, MD   6.25 mg at 05/21/19 0808  . clopidogrel (PLAVIX) tablet 75 mg  75 mg Oral Daily Cristofano, Worthy RancherPaul A, MD   75 mg at 05/21/19 0808  . divalproex (DEPAKOTE ER) 24 hr tablet 500 mg  500 mg Oral BID Cristofano, Worthy RancherPaul A, MD   500 mg at 05/21/19 0808  . escitalopram  (LEXAPRO) tablet 10 mg  10 mg Oral QHS Cristofano, Worthy RancherPaul A, MD   10 mg at 05/20/19 2124  . influenza vac split quadrivalent PF (FLUARIX) injection 0.5 mL  0.5 mL Intramuscular Tomorrow-1000 Constance Hackenberg T, MD      . levothyroxine (SYNTHROID) tablet 25 mcg  25 mcg Oral Daily Cristofano, Worthy RancherPaul A, MD   25 mcg at 05/21/19 1028  . lisinopril (ZESTRIL) tablet 2.5 mg  2.5 mg Oral Daily Cristofano, Worthy RancherPaul A, MD   2.5 mg at 05/21/19 0808  . magnesium hydroxide (MILK OF MAGNESIA) suspension 30 mL  30 mL Oral Daily PRN Cristofano, Paul A, MD      . QUEtiapine (SEROQUEL) tablet 100 mg  100 mg Oral QHS Naz Denunzio T, MD   100 mg at 05/20/19 2124  . QUEtiapine (SEROQUEL) tablet 50 mg  50 mg Oral Minerva FesterBH-q7a Nazari, Walid A, RPH   50 mg at 05/21/19 [id number]  . QUEtiapine (SEROQUEL) tablet 50 mg  50 mg Oral Q1200 Mila Merryazari, Walid A, RPH   50 mg at 05/21/19 1212    Lab Results: No results found for this or any previous visit (from the past 48 hour(s)).  Blood Alcohol level:  Lab Results  Component Value Date   ETH <10 05/13/2019   ETH <10 12/23/2018    Metabolic Disorder Labs: Lab Results  Component Value Date   HGBA1C 5.6 11/03/2018   MPG 114.02 11/03/2018   No results found for: PROLACTIN Lab Results  Component Value Date   CHOL 192 05/15/2019   TRIG 281 (H) 05/15/2019   HDL 46 05/15/2019   CHOLHDL 4.2 05/15/2019   VLDL 56 (H) 05/15/2019   LDLCALC 90 05/15/2019   LDLCALC 104 (H) 12/23/2018    Physical Findings: AIMS: Facial and Oral Movements Muscles of Facial Expression: None, normal Lips and Perioral Area: None, normal Jaw: None, normal Tongue: None, normal,Extremity Movements Upper (arms, wrists, hands, fingers): None, normal Lower (legs, knees, ankles, toes): None, normal, Trunk Movements Neck, shoulders, hips: None, normal, Overall Severity Severity of abnormal movements (highest score from questions above): None, normal Incapacitation due to abnormal movements: None, normal Patient's  awareness of abnormal movements (rate only patient's report): No Awareness, Dental Status Current problems with teeth and/or dentures?: Yes Does patient usually wear dentures?: No  CIWA:  CIWA-Ar Total: 1 COWS:  COWS Total Score: 0  Musculoskeletal: Strength & Muscle Tone: within normal limits Gait & Station: normal  Patient leans: N/A  Psychiatric Specialty Exam: Physical Exam  Nursing note and vitals reviewed. Constitutional: He appears well-developed and well-nourished.  HENT:  Head: Normocephalic and atraumatic.  Eyes: Pupils are equal, round, and reactive to light. Conjunctivae are normal.  Neck: Normal range of motion.  Cardiovascular: Regular rhythm and normal heart sounds.  Respiratory: Effort normal. No respiratory distress.  GI: Soft.  Musculoskeletal: Normal range of motion.  Neurological: He is alert.  Skin: Skin is warm and dry.  Psychiatric: He has a normal mood and affect. His behavior is normal. Judgment and thought content normal.    Review of Systems  Constitutional: Negative.   HENT: Negative.   Eyes: Negative.   Respiratory: Negative.   Cardiovascular: Negative.   Gastrointestinal: Negative.   Musculoskeletal: Negative.   Skin: Negative.   Neurological: Negative.   Psychiatric/Behavioral: Negative.     Blood pressure 108/70, pulse 88, temperature 98.1 F (36.7 C), temperature source Oral, resp. rate 17, height 5\' 9"  (1.753 m), weight 78.9 kg, SpO2 100 %.Body mass index is 25.7 kg/m.  General Appearance: Casual  Eye Contact:  Good  Speech:  Clear and Coherent  Volume:  Normal  Mood:  Euthymic  Affect:  Congruent  Thought Process:  Goal Directed  Orientation:  Full (Time, Place, and Person)  Thought Content:  Logical  Suicidal Thoughts:  No  Homicidal Thoughts:  No  Memory:  Immediate;   Fair Recent;   Fair Remote;   Fair  Judgement:  Fair  Insight:  Fair  Psychomotor Activity:  Normal  Concentration:  Concentration: Fair  Recall:  [employer] of Knowledge:  Fair  Language:  Fair  Akathisia:  No  Handed:  Right  AIMS (if indicated):     Assets:  Desire for Improvement Housing Physical Health  ADL's:  Intact  Cognition:  WNL  Sleep:  Number of Hours: 8     Treatment Plan Summary: Daily contact with patient to assess and evaluate symptoms and progress in treatment, Medication management and Plan Patient appears to be calm and stable.  I reassured him that as far as I know the plan is for discharge on Monday.  He is agreeable to that.  No indication to change any treatment at this time.  He did have a positive PPD so we will check to make sure that his follow-up chest x-ray and blood test for tuberculosis are negative  Alethia Berthold, MD 05/21/2019, 2:33 PM

## 2019-05-21 NOTE — BHH Counselor (Addendum)
CSW spoke with Lydia Guiles, legal guardian([facility] [employer]) to discuss pt discharge plan. Carly reports she has made a referral to [facility] for the pt's outpatient mental health treatment. She states the medical professionals will be able to deliver services in the group home, no referral for outpatient treatment requested at this time. Carly reports she will pick up the pt on Monday 11/16 at 10am-11am [facility].

## 2019-05-21 NOTE — Plan of Care (Signed)
D- Patient alert and oriented. Patient presents in a pleasant mood on assessment stating that he slept "good, great" last night and had no complaints to voice to this Probation officer. Patient denies any signs/symptoms of depression/anxiety. Patient also denies SI, HI, AVH, and pain at this time. Patient's goal for today is to "go home".  A- Scheduled medications administered to patient, per MD orders. Support and encouragement provided.  Routine safety checks conducted every 15 minutes.  Patient informed to notify staff with problems or concerns.  R- No adverse drug reactions noted. Patient contracts for safety at this time. Patient compliant with medications and treatment plan. Patient receptive, calm, and cooperative. Patient interacts well with others on the unit.  Patient remains safe at this time.  Problem: Education: Goal: Utilization of techniques to improve thought processes will improve Outcome: Progressing Goal: Knowledge of the prescribed therapeutic regimen will improve Outcome: Progressing   Problem: Activity: Goal: Interest or engagement in leisure activities will improve Outcome: Progressing Goal: Imbalance in normal sleep/wake cycle will improve Outcome: Progressing   Problem: Coping: Goal: Coping ability will improve Outcome: Progressing Goal: Will verbalize feelings Outcome: Progressing   Problem: Health Behavior/Discharge Planning: Goal: Ability to make decisions will improve Outcome: Progressing Goal: Compliance with therapeutic regimen will improve Outcome: Progressing   Problem: Role Relationship: Goal: Will demonstrate positive changes in social behaviors and relationships Outcome: Progressing   Problem: Safety: Goal: Ability to disclose and discuss suicidal ideas will improve Outcome: Progressing Goal: Ability to identify and utilize support systems that promote safety will improve Outcome: Progressing   Problem: Self-Concept: Goal: Will verbalize positive  feelings about self Outcome: Progressing Goal: Level of anxiety will decrease Outcome: Progressing   Problem: Consults Goal: Concurrent Medical Patient Education Description: (See Patient Education Module for education specifics) Outcome: Progressing   Problem: BHH Concurrent Medical Problem Goal: LTG-Pt will be physically stable and he/significant other Description: (Patient will be physically stable and he/significant other will be able to verbalize understanding of follow-up care and symptoms that would warrant further treatment) Outcome: Progressing Goal: STG-Vital signs will be within defined limits or stabilized Description: (STG- Vital signs will be within defined limits or stabilized for individual) Outcome: Progressing Goal: STG-Compliance with medication and/or treatment as ordered Description: (STG-Compliance with medication and/or treatment as ordered by MD) Outcome: Progressing Goal: STG-Verbalize two symptoms that would warrant further Description: (STG-Verbalize two symptoms that would warrant further treatment) Outcome: Progressing Goal: STG-Patient will participate in management/stabilization Description: (STG-Patient will participate in management/stabilization of medical condition) Outcome: Progressing Goal: STG-Other (Specify): Description: STG-Other Concurrent Medical (Specify): Outcome: Progressing

## 2019-05-22 LAB — QUANTIFERON-TB GOLD PLUS (RQFGPL)
QuantiFERON Mitogen Value: >10 IU/mL
QuantiFERON Nil Value: 0.03 IU/mL
QuantiFERON TB1 Ag Value: 0.03 IU/mL
QuantiFERON TB2 Ag Value: 0.03 IU/mL

## 2019-05-22 LAB — QUANTIFERON-TB GOLD PLUS: QuantiFERON-TB Gold Plus: NEGATIVE

## 2019-05-22 NOTE — Progress Notes (Signed)
[facility]  05/22/2019 12:24 PM Leanord AsalWiley Skibinski  MRN:  [id number] Subjective: Patient is a 58 year old male with a past history of chronic mood and behavioral problems who originally was [facility] under involuntary commitment filed by his daughter alleging that he had made homicidal threats.  Objective: Patient is seen and examined.  Patient is a 58 year old male with a reported history of bipolar disorder.  He is seen in follow-up.  Review of the electronic medical record revealed that his plan for discharge as early this week.  He is having a guardianship hearing sometime next week.  He has no complaints today.  His only request was to have double portions, and I referred him to the dietitian.  He is otherwise calm and cooperative.  He stated he is going to go stay with a stepdaughter her granddaughter.  He continues on Lexapro 10 mg p.o. daily, Seroquel 50 mg p.o. daily and 100 mg p.o. nightly, and Depakote ER 500 mg p.o. twice daily.  His Depakote level has not been measured as of yet.  He is mildly anemic with a hemoglobin of 12.5 and hematocrit of 37.2.  Liver function enzymes were normal.  His vital signs are stable, he is afebrile.  He slept 7.25 hours last night.  He denied any auditory or visual hallucinations.  He denied any suicidal or homicidal ideation.  Principal Problem: Bipolar 1 disorder (HCC) Diagnosis: Principal Problem:   Bipolar 1 disorder (HCC) Active Problems:   Coronary artery disease   Essential hypertension   History of stroke   Homicidal ideation   Hypothyroidism  Total Time spent with patient: 20 minutes  Past Psychiatric History: See admission H&P  Past Medical History:  Past Medical History:  Diagnosis Date  . Acute pulmonary edema (HCC) 2017  . Acute respiratory failure with hypoxia (HCC) 2017  . Bipolar 1 disorder (HCC)   . COPD (chronic obstructive pulmonary disease) (HCC)   . Depression    . Hypertension   . Myocardial infarction (HCC)   . NSTEMI (non-ST elevated myocardial infarction) (HCC)   . 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 Seizures (HCC)    childhood  . Stroke (HCC)   . Tobacco abuse    History reviewed. No pertinent surgical history. Family History: History reviewed. No pertinent family history. Family Psychiatric  History: See admission H&P Social History:  Social History   Substance and Sexual Activity  Alcohol Use Not Currently     Social History   Substance and Sexual Activity  Drug Use Never    Social History   Socioeconomic History  . Marital status: Widowed    Spouse name: Not on file  . Number of children: Not on file  . Years of education: Not on file  . Highest education level: Not on file  Occupational History  . Not on file  Social Needs  . Financial resource strain: Not on file  . Food insecurity    Worry: Not on file    Inability: Not on file  . Transportation needs    Medical: Not on file    Non-medical: Not on file  Tobacco Use  . Smoking status: Former Smoker    Packs/day: 0.00    Years: 0.00    Pack years: 0.00  . Smokeless tobacco: Never Used  Substance and Sexual Activity  . Alcohol use: Not Currently  . Drug use: Never  . Sexual activity: Not Currently  Birth control/protection: Abstinence  Lifestyle  . Physical activity    Days per week: Not on file    Minutes per session: Not on file  . Stress: Not on file  Relationships  . Social Herbalist on phone: Not on file    Gets together: Not on file    Attends religious service: Not on file    Active member of club or organization: Not on file    Attends meetings of clubs or organizations: Not on file    Relationship status: Not on file  Other Topics Concern  . Not on file  Social History Narrative  . Not on file   Additional Social History:    History of alcohol / drug use?: Yes                     Sleep: Good  Appetite:  Good  Current Medications: Current Facility-Administered Medications  Medication Dose Route Frequency Provider Last Rate Last Dose  . acetaminophen (TYLENOL) tablet 650 mg  650 mg Oral Q6H PRN Cristofano, Dorene Ar, MD   650 mg at 05/21/19 1029  . albuterol (VENTOLIN HFA) 108 (90 Base) MCG/ACT inhaler 2 puff  2 puff Inhalation Q6H PRN Cristofano, Dorene Ar, MD      . alum & mag hydroxide-simeth (MAALOX/MYLANTA) 200-200-20 MG/5ML suspension 30 mL  30 mL Oral Q4H PRN Cristofano, Dorene Ar, MD      . aspirin EC tablet 81 mg  81 mg Oral Daily Cristofano, Dorene Ar, MD   81 mg at 05/22/19 0808  . carvedilol (COREG) tablet 6.25 mg  6.25 mg Oral BID Cristofano, Dorene Ar, MD   6.25 mg at 05/22/19 0808  . clopidogrel (PLAVIX) tablet 75 mg  75 mg Oral Daily Cristofano, Dorene Ar, MD   75 mg at 05/22/19 0808  . divalproex (DEPAKOTE ER) 24 hr tablet 500 mg  500 mg Oral BID Cristofano, Dorene Ar, MD   500 mg at 05/22/19 0808  . escitalopram (LEXAPRO) tablet 10 mg  10 mg Oral QHS Cristofano, Dorene Ar, MD   10 mg at 05/21/19 2119  . influenza vac split quadrivalent PF (FLUARIX) injection 0.5 mL  0.5 mL Intramuscular Tomorrow-1000 Clapacs, John T, MD      . levothyroxine (SYNTHROID) tablet 25 mcg  25 mcg Oral Daily Cristofano, Dorene Ar, MD   25 mcg at 05/22/19 [phone]  . lisinopril (ZESTRIL) tablet 2.5 mg  2.5 mg Oral Daily Cristofano, Dorene Ar, MD   2.5 mg at 05/22/19 0808  . magnesium hydroxide (MILK OF MAGNESIA) suspension 30 mL  30 mL Oral Daily PRN Cristofano, Paul A, MD      . QUEtiapine (SEROQUEL) tablet 100 mg  100 mg Oral QHS Clapacs, Madie Reno, MD   100 mg at 05/21/19 2119  . QUEtiapine (SEROQUEL) tablet 50 mg  50 mg Oral Beryle Beams, Walid A, RPH   50 mg at 05/22/19 4259  . QUEtiapine (SEROQUEL) tablet 50 mg  50 mg Oral Q1200 Rito Ehrlich A, RPH   50 mg at 05/21/19 1212    Lab Results: No results found for this or any previous visit (from the past 48 hour(s)).  Blood Alcohol level:  Lab Results   Component Value Date   [facility] <10 05/13/2019   ETH <10 56/38/7564    Metabolic Disorder Labs: Lab Results  Component Value Date   HGBA1C 5.6 11/03/2018   MPG 114.02 11/03/2018   No results found for: PROLACTIN Lab  Results  Component Value Date   CHOL 192 05/15/2019   TRIG 281 (H) 05/15/2019   HDL 46 05/15/2019   CHOLHDL 4.2 05/15/2019   VLDL 56 (H) 05/15/2019   LDLCALC 90 05/15/2019   LDLCALC 104 (H) 12/23/2018    Physical Findings: AIMS: Facial and Oral Movements Muscles of Facial Expression: None, normal Lips and Perioral Area: None, normal Jaw: None, normal Tongue: None, normal,Extremity Movements Upper (arms, wrists, hands, fingers): None, normal Lower (legs, knees, ankles, toes): None, normal, Trunk Movements Neck, shoulders, hips: None, normal, Overall Severity Severity of abnormal movements (highest score from questions above): None, normal Incapacitation due to abnormal movements: None, normal Patient's awareness of abnormal movements (rate only patient's report): No Awareness, Dental Status Current problems with teeth and/or dentures?: Yes Does patient usually wear dentures?: No  CIWA:  CIWA-Ar Total: 1 COWS:  COWS Total Score: 0  Musculoskeletal: Strength & Muscle Tone: within normal limits Gait & Station: normal Patient leans: N/A  Psychiatric Specialty Exam: Physical Exam  Nursing note and vitals reviewed. Constitutional: He appears well-developed and well-nourished.  HENT:  Head: Normocephalic and atraumatic.  Respiratory: Effort normal.    ROS  Blood pressure 124/75, pulse 74, temperature 97.7 F (36.5 C), temperature source Oral, resp. rate 17, height 5\' 9"  (1.753 m), weight 78.9 kg, SpO2 100 %.Body mass index is 25.7 kg/m.  General Appearance: Casual  Eye Contact:  Fair  Speech:  Normal Rate  Volume:  Normal  Mood:  Anxious  Affect:  Congruent  Thought Process:  Coherent and Descriptions of Associations: Intact  Orientation:  Full (Time,  Place, and Person)  Thought Content:  Logical  Suicidal Thoughts:  No  Homicidal Thoughts:  No  Memory:  Immediate;   Fair Recent;   Fair Remote;   Fair  Judgement:  Intact  Insight:  Fair  Psychomotor Activity:  Increased  Concentration:  Concentration: Fair and Attention Span: Fair  Recall:  of Knowledge:  Fair  Language:  Fair  Akathisia:  Negative  Handed:  Right  AIMS (if indicated):     Assets:  Desire for Improvement Resilience  ADL's:  Intact  Cognition:  WNL  Sleep:  Number of Hours: 7.25     Treatment Plan Summary: Daily contact with patient to assess and evaluate symptoms and progress in treatment, Medication management and Plan : Patient is seen and examined.  Patient is a 58 year old male with the above-stated past psychiatric history who is seen in follow-up.   Diagnosis: #1 bipolar disorder, type I  Patient is seen in follow-up.  He is doing well.  The plan is for discharge early this week.  Guardianship hearing is later this week.  He is sleeping well.  No evidence of psychosis or suicidal or homicidal ideation.  I will order Depakote levels, liver function enzymes and a CBC for him prior to discharge.  No other changes in his treatment plan or medications at this point. 1.  Continue Ventolin HFA 2 puffs every 6 hours as needed wheezing. 2.  Continue coated aspirin 81 mg p.o. daily for heart health. 3.  Continue carvedilol 6.25 mg p.o. twice daily for blood pressure and heart health. 4.  Continue Plavix 75 mg p.o. daily for anticoagulation. 5.  Continue Depakote ER 500 mg p.o. twice daily for mood stability. 6.  Continue Lexapro 10 mg p.o. daily for anxiety and depression. 7.  Continue levothyroxine 25 mcg p.o. daily for hypothyroidism. 8.  Continue lisinopril 2.5 mg p.o.  daily for hypertension. 8.  Continue Seroquel 50 mg p.o. daily and 100 mg p.o. nightly for mood stability. 9.  Disposition planning-in progress.  Antonieta Pert,  MD 05/22/2019, 12:24 PM

## 2019-05-22 NOTE — Progress Notes (Signed)
Patient alert and oriented x 4, affect is flat. he brightens upon approach, interacting appropriately with peers and staff, writer noted patient pacing the unit he stated " I am worried about where l am going " he appaers anxious, he was offered emotional support and informed him social work was helping him with placement. Patient is compliant with medication regimen,  15 minutes safety checks maintained will continue to monitor

## 2019-05-22 NOTE — Plan of Care (Signed)
Denies SI/AVH/HI. Reports "I am going home soon, I feel great. Denies any anxiety and depression at this time. Patient is seen in milieu interacting with peers. Patient's safety is maintained with safety rounds.    Problem: Education: Goal: Utilization of techniques to improve thought processes will improve Outcome: Not Progressing   Problem: Education: Goal: Utilization of techniques to improve thought processes will improve Outcome: Not Progressing Goal: Knowledge of the prescribed therapeutic regimen will improve Outcome: Not Progressing   Problem: Activity: Goal: Interest or engagement in leisure activities will improve Outcome: Not Progressing

## 2019-05-22 NOTE — [facility] Group Notes (Signed)
[facility] Group Notes:  (Nursing/MHT/Case Management/Adjunct)  Date:  05/22/2019  Time:  8:58 PM  Type of Therapy:  Wrap up  group  Participation Level:  Did Not Attend   Nehemiah Settle 05/22/2019, 8:58 PM

## 2019-05-23 NOTE — Plan of Care (Signed)
Patient is pending routine  discharge , is attending groups and is not requesting PRNs , denies SI/HI/AVH , sleep is restful through the night and only requiring routine visual check ,support and education is provided no distress.   Problem: Education: Goal: Utilization of techniques to improve thought processes will improve Outcome: Progressing Goal: Knowledge of the prescribed therapeutic regimen will improve Outcome: Progressing   Problem: Activity: Goal: Interest or engagement in leisure activities will improve Outcome: Progressing Goal: Imbalance in normal sleep/wake cycle will improve Outcome: Progressing   Problem: Coping: Goal: Coping ability will improve Outcome: Progressing Goal: Will verbalize feelings Outcome: Progressing   Problem: Health Behavior/Discharge Planning: Goal: Ability to make decisions will improve Outcome: Progressing Goal: Compliance with therapeutic regimen will improve Outcome: Progressing   Problem: Role Relationship: Goal: Will demonstrate positive changes in social behaviors and relationships Outcome: Progressing   Problem: Safety: Goal: Ability to disclose and discuss suicidal ideas will improve Outcome: Progressing Goal: Ability to identify and utilize support systems that promote safety will improve Outcome: Progressing   Problem: Self-Concept: Goal: Will verbalize positive feelings about self Outcome: Progressing Goal: Level of anxiety will decrease Outcome: Progressing   Problem: Consults Goal: Concurrent Medical Patient Education Description: (See Patient Education Module for education specifics) Outcome: Progressing   Problem: [facility] Concurrent Medical Problem Goal: LTG-Pt will be physically stable and he/significant other Description: (Patient will be physically stable and he/significant other will be able to verbalize understanding of follow-up care and symptoms that would warrant further treatment) Outcome: Progressing Goal:  STG-Vital signs will be within defined limits or stabilized Description: (STG- Vital signs will be within defined limits or stabilized for individual) Outcome: Progressing Goal: STG-Compliance with medication and/or treatment as ordered Description: (STG-Compliance with medication and/or treatment as ordered by MD) Outcome: Progressing Goal: STG-Verbalize two symptoms that would warrant further Description: (STG-Verbalize two symptoms that would warrant further treatment) Outcome: Progressing Goal: STG-Patient will participate in management/stabilization Description: (STG-Patient will participate in management/stabilization of medical condition) Outcome: Progressing Goal: STG-Other (Specify): Description: STG-Other Concurrent Medical (Specify): Outcome: Progressing

## 2019-05-23 NOTE — Progress Notes (Signed)
Patient stated to this writer that he is upset because the doctor mentioned to him about going to a nursing home. Patient stated that he couldn't deal with that because he's known people to die in nursing homes.

## 2019-05-23 NOTE — Plan of Care (Signed)
Patient said I am doing fine now that I know that I will be going home , I don't want anything spoil it", patient is comfortable and socializing well with peers with out any issues , patient is medication compliant and maintaining safety regimen, denies any SI/HI/AVH , did not request PRM meds, only needing 15 minutes safety check no distress.   Problem: Education: Goal: Utilization of techniques to improve thought processes will improve Outcome: Progressing Goal: Knowledge of the prescribed therapeutic regimen will improve Outcome: Progressing   Problem: Activity: Goal: Interest or engagement in leisure activities will improve Outcome: Progressing Goal: Imbalance in normal sleep/wake cycle will improve Outcome: Progressing   Problem: Coping: Goal: Coping ability will improve Outcome: Progressing Goal: Will verbalize feelings Outcome: Progressing   Problem: Health Behavior/Discharge Planning: Goal: Ability to make decisions will improve Outcome: Progressing Goal: Compliance with therapeutic regimen will improve Outcome: Progressing   Problem: Role Relationship: Goal: Will demonstrate positive changes in social behaviors and relationships Outcome: Progressing   Problem: Safety: Goal: Ability to disclose and discuss suicidal ideas will improve Outcome: Progressing Goal: Ability to identify and utilize support systems that promote safety will improve Outcome: Progressing   Problem: Self-Concept: Goal: Will verbalize positive feelings about self Outcome: Progressing Goal: Level of anxiety will decrease Outcome: Progressing   Problem: Consults Goal: Concurrent Medical Patient Education Description: (See Patient Education Module for education specifics) Outcome: Progressing   Problem: [facility] Concurrent Medical Problem Goal: LTG-Pt will be physically stable and he/significant other Description: (Patient will be physically stable and he/significant other will be able to  verbalize understanding of follow-up care and symptoms that would warrant further treatment) Outcome: Progressing Goal: STG-Vital signs will be within defined limits or stabilized Description: (STG- Vital signs will be within defined limits or stabilized for individual) Outcome: Progressing Goal: STG-Compliance with medication and/or treatment as ordered Description: (STG-Compliance with medication and/or treatment as ordered by MD) Outcome: Progressing Goal: STG-Verbalize two symptoms that would warrant further Description: (STG-Verbalize two symptoms that would warrant further treatment) Outcome: Progressing Goal: STG-Patient will participate in management/stabilization Description: (STG-Patient will participate in management/stabilization of medical condition) Outcome: Progressing Goal: STG-Other (Specify): Description: STG-Other Concurrent Medical (Specify): Outcome: Progressing

## 2019-05-23 NOTE — [facility] Group Notes (Signed)
LCSW Group Therapy Note 05/23/2019 1:15pm  Type of Therapy and Topic: Group Therapy: Feelings Around Returning Home & Establishing a Supportive Framework and Supporting Oneself When Supports Not Available  Participation Level: Active  Description of Group:  Patients first processed thoughts and feelings about upcoming discharge. These included fears of upcoming changes, lack of change, new living environments, judgements and expectations from others and overall stigma of mental health issues. The group then discussed the definition of a supportive framework, what that looks and feels like, and how do to discern it from an unhealthy non-supportive network. The group identified different types of supports as well as what to do when your family/friends are less than helpful or unavailable  Therapeutic Goals  1. Patient will identify one healthy supportive network that they can use at discharge. 2. Patient will identify one factor of a supportive framework and how to tell it from an unhealthy network. 3. Patient able to identify one coping skill to use when they do not have positive supports from others. 4. Patient will demonstrate ability to communicate their needs through discussion and/or role plays.  Summary of Patient Progress:  The patient reported he feels "good." Pt engaged during group session. As patients processed their anxiety about discharge and described healthy supports patient shared he is ready to be discharge.  Patients identified at least one self-care tool they were willing to use after discharge.   Therapeutic Modalities Cognitive Behavioral Therapy Motivational Interviewing   Cheree Ditto, LCSW 05/23/2019 12:24 PM

## 2019-05-23 NOTE — Plan of Care (Signed)
D- Patient alert and oriented. Patient presents in a pleasant mood on assessment stating that he slept good last night and had no complaints or concerns to voice. Patient denies SI, HI, AVH, and pain at this time. Patient also denies signs/symptoms of depression/anxiety to this Probation officer. Patient's goal for today is "going home", in which he "everything today" will allow him to achieve his goal.  A- Scheduled medications administered to patient, per MD orders. Support and encouragement provided.  Routine safety checks conducted every 15 minutes.  Patient informed to notify staff with problems or concerns.  R- No adverse drug reactions noted. Patient contracts for safety at this time. Patient compliant with medications and treatment plan. Patient receptive, calm, and cooperative. Patient interacts well with others on the unit.  Patient remains safe at this time.  Problem: Education: Goal: Utilization of techniques to improve thought processes will improve Outcome: Progressing Goal: Knowledge of the prescribed therapeutic regimen will improve Outcome: Progressing   Problem: Activity: Goal: Interest or engagement in leisure activities will improve Outcome: Progressing Goal: Imbalance in normal sleep/wake cycle will improve Outcome: Progressing   Problem: Coping: Goal: Coping ability will improve Outcome: Progressing Goal: Will verbalize feelings Outcome: Progressing   Problem: Health Behavior/Discharge Planning: Goal: Ability to make decisions will improve Outcome: Progressing Goal: Compliance with therapeutic regimen will improve Outcome: Progressing   Problem: Role Relationship: Goal: Will demonstrate positive changes in social behaviors and relationships Outcome: Progressing   Problem: Safety: Goal: Ability to disclose and discuss suicidal ideas will improve Outcome: Progressing Goal: Ability to identify and utilize support systems that promote safety will improve Outcome:  Progressing   Problem: Self-Concept: Goal: Will verbalize positive feelings about self Outcome: Progressing Goal: Level of anxiety will decrease Outcome: Progressing   Problem: Consults Goal: Concurrent Medical Patient Education Description: (See Patient Education Module for education specifics) Outcome: Progressing   Problem: [facility] Concurrent Medical Problem Goal: LTG-Pt will be physically stable and he/significant other Description: (Patient will be physically stable and he/significant other will be able to verbalize understanding of follow-up care and symptoms that would warrant further treatment) Outcome: Progressing Goal: STG-Vital signs will be within defined limits or stabilized Description: (STG- Vital signs will be within defined limits or stabilized for individual) Outcome: Progressing Goal: STG-Compliance with medication and/or treatment as ordered Description: (STG-Compliance with medication and/or treatment as ordered by MD) Outcome: Progressing Goal: STG-Verbalize two symptoms that would warrant further Description: (STG-Verbalize two symptoms that would warrant further treatment) Outcome: Progressing Goal: STG-Patient will participate in management/stabilization Description: (STG-Patient will participate in management/stabilization of medical condition) Outcome: Progressing Goal: STG-Other (Specify): Description: STG-Other Concurrent Medical (Specify): Outcome: Progressing

## 2019-05-23 NOTE — Progress Notes (Signed)
[facility] MD Progress Note  05/23/2019 10:16 AM Miliano Cotten  MRN:  [id number] Subjective:  Patient is a 58 year old male with a past history of chronic mood and behavioral problems who originally was brought into the [facility] emergency department under involuntary commitment filed by his daughter alleging that he had made homicidal threats.  Objective: Patient is seen and examined.  Patient is a 58 year old male with a reported past psychiatric history significant for bipolar disorder who is seen in follow-up.  He is essentially unchanged from yesterday.  Yesterday he was talking about going to his stepdaughters or some other relative on discharge on Monday.  Unfortunately this morning he still has not been able to make contact with any family members.  Nursing believes that he is going to some form of a group home.  His main focus this morning is finding out the outcome of the DSS investigation about his family.  I did remind him that his daughter is apparently his guardian, but he stated she was not.  He stated he was unable to return to that home because of the behavior that took place in the home.  His vital signs are stable this morning.  He slept 8 hours.  He does not have a TSH in the chart, and I have written for that to take place today or tomorrow.  No other new laboratories.  He denied any auditory or visual hallucinations.  He denied any suicidal or homicidal ideation.  Principal Problem: Bipolar 1 disorder (HCC) Diagnosis: Principal Problem:   Bipolar 1 disorder (HCC) Active Problems:   Coronary artery disease   Essential hypertension   History of stroke   Homicidal ideation   Hypothyroidism  Total Time spent with patient: 15 minutes  Past Psychiatric History: See admission H&P  Past Medical History:  Past Medical History:  Diagnosis Date  . Acute pulmonary edema (HCC) 2017  . Acute respiratory failure with hypoxia (HCC) 2017  . Bipolar 1 disorder (HCC)   .  COPD (chronic obstructive pulmonary disease) (HCC)   . Depression   . Hypertension   . Myocardial infarction (HCC)   . NSTEMI (non-ST elevated myocardial infarction) (HCC)   . 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 Seizures (HCC)    childhood  . Stroke (HCC)   . Tobacco abuse    History reviewed. No pertinent surgical history. Family History: History reviewed. No pertinent family history. Family Psychiatric  History: See admission H&P Social History:  Social History   Substance and Sexual Activity  Alcohol Use Not Currently     Social History   Substance and Sexual Activity  Drug Use Never    Social History   Socioeconomic History  . Marital status: Widowed    Spouse name: Not on file  . Number of children: Not on file  . Years of education: Not on file  . Highest education level: Not on file  Occupational History  . Not on file  Social Needs  . Financial resource strain: Not on file  . Food insecurity    Worry: Not on file    Inability: Not on file  . Transportation needs    Medical: Not on file    Non-medical: Not on file  Tobacco Use  . Smoking status: Former Smoker    Packs/day: 0.00    Years: 0.00    Pack years: 0.00  . Smokeless tobacco: Never Used  Substance and Sexual Activity  . Alcohol use: Not  Currently  . Drug use: Never  . Sexual activity: Not Currently    Birth control/protection: Abstinence  Lifestyle  . Physical activity    Days per week: Not on file    Minutes per session: Not on file  . Stress: Not on file  Relationships  . Social Musicianconnections    Talks on phone: Not on file    Gets together: Not on file    Attends religious service: Not on file    Active member of club or organization: Not on file    Attends meetings of clubs or organizations: Not on file    Relationship status: Not on file  Other Topics Concern  . Not on file  Social History Narrative  . Not on file   Additional Social History:     History of alcohol / drug use?: Yes                    Sleep: Good  Appetite:  Good  Current Medications: Current Facility-Administered Medications  Medication Dose Route Frequency Provider Last Rate Last Dose  . acetaminophen (TYLENOL) tablet 650 mg  650 mg Oral Q6H PRN Cristofano, Worthy RancherPaul A, MD   650 mg at 05/22/19 1346  . albuterol (VENTOLIN HFA) 108 (90 Base) MCG/ACT inhaler 2 puff  2 puff Inhalation Q6H PRN Cristofano, Worthy RancherPaul A, MD      . alum & mag hydroxide-simeth (MAALOX/MYLANTA) 200-200-20 MG/5ML suspension 30 mL  30 mL Oral Q4H PRN Cristofano, Worthy RancherPaul A, MD      . aspirin EC tablet 81 mg  81 mg Oral Daily Cristofano, Worthy RancherPaul A, MD   81 mg at 05/23/19 0829  . carvedilol (COREG) tablet 6.25 mg  6.25 mg Oral BID Cristofano, Worthy RancherPaul A, MD   6.25 mg at 05/23/19 0829  . clopidogrel (PLAVIX) tablet 75 mg  75 mg Oral Daily Cristofano, Worthy RancherPaul A, MD   75 mg at 05/23/19 0829  . divalproex (DEPAKOTE ER) 24 hr tablet 500 mg  500 mg Oral BID Cristofano, Worthy RancherPaul A, MD   500 mg at 05/23/19 0829  . escitalopram (LEXAPRO) tablet 10 mg  10 mg Oral QHS Cristofano, Worthy RancherPaul A, MD   10 mg at 05/22/19 2105  . influenza vac split quadrivalent PF (FLUARIX) injection 0.5 mL  0.5 mL Intramuscular Tomorrow-1000 Clapacs, John T, MD      . levothyroxine (SYNTHROID) tablet 25 mcg  25 mcg Oral Daily Cristofano, Worthy RancherPaul A, MD   25 mcg at 05/23/19 0615  . lisinopril (ZESTRIL) tablet 2.5 mg  2.5 mg Oral Daily Cristofano, Worthy RancherPaul A, MD   2.5 mg at 05/23/19 0829  . magnesium hydroxide (MILK OF MAGNESIA) suspension 30 mL  30 mL Oral Daily PRN Cristofano, Paul A, MD      . QUEtiapine (SEROQUEL) tablet 100 mg  100 mg Oral QHS Clapacs, John T, MD   100 mg at 05/22/19 2105  . QUEtiapine (SEROQUEL) tablet 50 mg  50 mg Oral Herbie DrapeBH-q7a Nazari, Walid A, RPH   50 mg at 05/23/19 0615  . QUEtiapine (SEROQUEL) tablet 50 mg  50 mg Oral Q1200 Mila Merryazari, Walid A, RPH   50 mg at 05/22/19 1256    Lab Results: No results found for this or any previous visit  (from the past 48 hour(s)).  Blood Alcohol level:  Lab Results  Component Value Date   [facility]   ETH <10 12/23/2018    Metabolic Disorder Labs: Lab Results  Component Value Date   HGBA1C  5.6 11/03/2018   MPG 114.02 11/03/2018   No results found for: PROLACTIN Lab Results  Component Value Date   CHOL 192 05/15/2019   TRIG 281 (H) 05/15/2019   HDL 46 05/15/2019   CHOLHDL 4.2 05/15/2019   VLDL 56 (H) 05/15/2019   LDLCALC 90 05/15/2019   LDLCALC 104 (H) 12/23/2018    Physical Findings: AIMS: Facial and Oral Movements Muscles of Facial Expression: None, normal Lips and Perioral Area: None, normal Jaw: None, normal Tongue: None, normal,Extremity Movements Upper (arms, wrists, hands, fingers): None, normal Lower (legs, knees, ankles, toes): None, normal, Trunk Movements Neck, shoulders, hips: None, normal, Overall Severity Severity of abnormal movements (highest score from questions above): None, normal Incapacitation due to abnormal movements: None, normal Patient's awareness of abnormal movements (rate only patient's report): No Awareness, Dental Status Current problems with teeth and/or dentures?: Yes Does patient usually wear dentures?: No  CIWA:  CIWA-Ar Total: 1 COWS:  COWS Total Score: 0  Musculoskeletal: Strength & Muscle Tone: within normal limits Gait & Station: normal Patient leans: N/A  Psychiatric Specialty Exam: Physical Exam  Vitals reviewed. Constitutional: He is oriented to person, place, and time. He appears well-developed and well-nourished.  HENT:  Head: Normocephalic and atraumatic.  Respiratory: Effort normal.  Neurological: He is alert and oriented to person, place, and time.    ROS  Blood pressure 133/86, pulse 74, temperature (!) 97.5 F (36.4 C), temperature source Oral, resp. rate 18, height  (1.753 m), weight 78.9 kg, SpO2 100 %.Body mass index is 25.7 kg/m.  General Appearance: Casual  Eye Contact:  Fair  Speech:   Normal Rate  Volume:  Normal  Mood:  Anxious and Irritable  Affect:  Congruent  Thought Process:  Coherent and Descriptions of Associations: Circumstantial  Orientation:  Full (Time, Place, and Person)  Thought Content:  Logical  Suicidal Thoughts:  No  Homicidal Thoughts:  No  Memory:  Immediate;   Fair Recent;   Fair Remote;   Fair  Judgement:  Intact  Insight:  Lacking  Psychomotor Activity:  Normal  Concentration:  Concentration: Fair and Attention Span: Fair  Recall:  [employer] of Knowledge:  Fair  Language:  Fair  Akathisia:  Negative  Handed:  Right  AIMS (if indicated):     Assets:  Desire for Improvement Resilience  ADL's:  Intact  Cognition:  WNL  Sleep:  Number of Hours: 8     Treatment Plan Summary: Daily contact with patient to assess and evaluate symptoms and progress in treatment, Medication management and Plan : Patient is seen and examined.  Patient is a 58 year old male with the above-stated past psychiatric history who is seen in follow-up.  Diagnosis: #1 bipolar disorder, type I, #2 hypothyroidism, #3 hypertension  Patient is seen in follow-up.  He is essentially unchanged from yesterday.  The plan about his discharge from his perspective is not very clear.  I have spoken the nursing to discuss with social work to give him a little bit more additional information of what the discharge plan is and where he is ending up going tomorrow.  He denied any suicidal or homicidal ideation.  No racing thoughts, no pressured speech.  His vital signs are stable, he is afebrile.  I have ordered a TSH.  On admission his TSH was elevated at 9, and he has been on 25 mcg p.o. daily.  Hopefully it will be trending down. 1.  Continue Ventolin HFA 2 puffs every 6 hours as  needed wheezing. 2.  Continue coated aspirin 81 mg p.o. daily for heart health. 3.  Continue carvedilol 6.25 mg p.o. twice daily for blood pressure and heart health. 4.  Continue Plavix 75 mg p.o. daily for  anticoagulation. 5.  Continue Depakote ER 500 mg p.o. twice daily for mood stability. 6.  Continue Lexapro 10 mg p.o. daily for anxiety and depression. 7.  Continue levothyroxine 25 mcg p.o. daily for hypothyroidism. 8.  Continue lisinopril 2.5 mg p.o. daily for hypertension. 8.  Continue Seroquel 50 mg p.o. daily and 100 mg p.o. nightly for mood stability. 9.    Order TSH prior to discharge. 10.  Disposition planning-in progress.   Sharma Covert, MD 05/23/2019, 10:16 AM

## 2019-05-24 LAB — HEPATIC FUNCTION PANEL
ALT: 75 U/L — ABNORMAL HIGH (ref 0–44)
AST: 61 U/L — ABNORMAL HIGH (ref 15–41)
Albumin: 3.7 g/dL (ref 3.5–5.0)
Alkaline Phosphatase: 86 U/L (ref 38–126)
Bilirubin, Direct: 0.1 mg/dL (ref 0.0–0.2)
Indirect Bilirubin: 0.7 mg/dL (ref 0.3–0.9)
Total Bilirubin: 0.8 mg/dL (ref 0.3–1.2)
Total Protein: 6.7 g/dL (ref 6.5–8.1)

## 2019-05-24 LAB — CBC WITH DIFFERENTIAL/PLATELET
Abs Immature Granulocytes: 0.05 10*3/uL (ref 0.00–0.07)
Basophils Absolute: 0.1 10*3/uL (ref 0.0–0.1)
Basophils Relative: 1 %
Eosinophils Absolute: 0.2 10*3/uL (ref 0.0–0.5)
Eosinophils Relative: 4 %
HCT: 35.2 % — ABNORMAL LOW (ref 39.0–52.0)
Hemoglobin: 11.6 g/dL — ABNORMAL LOW (ref 13.0–17.0)
Immature Granulocytes: 1 %
Lymphocytes Relative: 32 %
Lymphs Abs: 1.9 10*3/uL (ref 0.7–4.0)
MCH: 30.4 pg (ref 26.0–34.0)
MCHC: 33 g/dL (ref 30.0–36.0)
MCV: 92.1 fL (ref 80.0–100.0)
Monocytes Absolute: 0.8 10*3/uL (ref 0.1–1.0)
Monocytes Relative: 13 %
Neutro Abs: 2.9 10*3/uL (ref 1.7–7.7)
Neutrophils Relative %: 49 %
Platelets: 230 10*3/uL (ref 150–400)
RBC: 3.82 MIL/uL — ABNORMAL LOW (ref 4.22–5.81)
RDW: 14.7 % (ref 11.5–15.5)
WBC: 6 10*3/uL (ref 4.0–10.5)
nRBC: 0 % (ref 0.0–0.2)

## 2019-05-24 LAB — TSH: TSH: 4.523 u[IU]/mL — ABNORMAL HIGH (ref 0.350–4.500)

## 2019-05-24 LAB — VALPROIC ACID LEVEL: Valproic Acid Lvl: 83 ug/mL (ref 50.0–100.0)

## 2019-05-24 MED ORDER — LISINOPRIL 2.5 MG PO TABS: 2.5000 mg | ORAL_TABLET | Freq: Every day | ORAL | 1 refills | Status: DC

## 2019-05-24 MED ORDER — CARVEDILOL 6.25 MG PO TABS: 6.2500 mg | ORAL_TABLET | Freq: Two times a day (BID) | ORAL | 1 refills | Status: DC

## 2019-05-24 MED ORDER — ASPIRIN EC 81 MG PO TBEC: 81.0000 mg | DELAYED_RELEASE_TABLET | Freq: Every day | ORAL | 1 refills | Status: DC

## 2019-05-24 MED ORDER — CLOPIDOGREL BISULFATE 75 MG PO TABS: 75.0000 mg | ORAL_TABLET | Freq: Every day | ORAL | 1 refills | Status: DC

## 2019-05-24 MED ORDER — QUETIAPINE FUMARATE 50 MG PO TABS: 50.0000 mg | ORAL_TABLET | ORAL | 1 refills | Status: DC

## 2019-05-24 MED ORDER — ALBUTEROL SULFATE HFA 108 (90 BASE) MCG/ACT IN AERS: 2.0000 | INHALATION_SPRAY | Freq: Four times a day (QID) | RESPIRATORY_TRACT | 1 refills | Status: DC | PRN

## 2019-05-24 MED ORDER — DIVALPROEX SODIUM ER 500 MG PO TB24: 500.0000 mg | ORAL_TABLET | Freq: Two times a day (BID) | ORAL | 1 refills | Status: DC

## 2019-05-24 MED ORDER — ESCITALOPRAM OXALATE 10 MG PO TABS: 10.0000 mg | ORAL_TABLET | Freq: Every day | ORAL | 1 refills | Status: DC

## 2019-05-24 MED ORDER — LEVOTHYROXINE SODIUM 25 MCG PO TABS: 25.0000 ug | ORAL_TABLET | Freq: Every day | ORAL | 1 refills | Status: DC

## 2019-05-24 NOTE — [facility] Suicide Risk Assessment (Signed)
[facility] Discharge Suicide Risk Assessment   Principal Problem: Bipolar 1 disorder [facility]) Discharge Diagnoses: Principal Problem:   Bipolar 1 disorder ([place]) Active Problems:   Coronary artery disease   Essential hypertension   History of stroke   Homicidal ideation   Hypothyroidism   Total Time spent with patient: 30 minutes  Musculoskeletal: Strength & Muscle Tone: within normal limits Gait & Station: normal Patient leans: N/A  Psychiatric Specialty Exam: Review of Systems  Constitutional: Negative.   HENT: Negative.   Eyes: Negative.   Respiratory: Negative.   Cardiovascular: Negative.   Gastrointestinal: Negative.   Musculoskeletal: Negative.   Skin: Negative.   Neurological: Negative.   Psychiatric/Behavioral: Negative.     Blood pressure (!) 141/93, pulse 87, temperature (!) 97.3 F (36.3 C), temperature source Oral, resp. rate 18, height 5\' 9"  (1.753 m), weight 78.9 kg, SpO2 98 %.Body mass index is 25.7 kg/m.  General Appearance: Casual  Eye Contact::  Good  Speech:  Clear and [id number]  Volume:  Normal  Mood:  Euthymic  Affect:  Congruent  Thought Process:  Goal Directed  Orientation:  Full (Time, Place, and Person)  Thought Content:  Logical  Suicidal Thoughts:  No  Homicidal Thoughts:  No  Memory:  Immediate;   Fair Recent;   Fair Remote;   Fair  Judgement:  Fair  Insight:  Fair  Psychomotor Activity:  Normal  Concentration:  Fair  Recall:  [employer] of Knowledge:Fair  Language: Fair  Akathisia:  No  Handed:  Right  AIMS (if indicated):     Assets:  Communication Skills Desire for Improvement Resilience Social Support  Sleep:  Number of Hours: 8  Cognition: WNL  ADL's:  Intact   Mental Status Per Nursing Assessment::   On Admission:  NA  Demographic Factors:  Male  Loss Factors: Financial problems/change in socioeconomic status  Historical Factors: Impulsivity  Risk Reduction Factors:   Living with another person, especially a  relative, Positive social support and Positive therapeutic relationship  Continued Clinical Symptoms:  Bipolar Disorder:   Mixed State Schizophrenia:   Depressive state  Cognitive Features That Contribute To Risk:  None    Suicide Risk:  Minimal: No identifiable suicidal ideation.  Patients presenting with no risk factors but with morbid ruminations; may be classified as minimal risk based on the severity of the depressive symptoms  Follow-up Information    Legal guardian declined Follow up.   Why: Legal guardian is setting the pt up with doctors making house calls.          Plan Of Care/Follow-up recommendations:  Activity:  Activity as tolerated Diet:  Heart healthy diet if possible or regular diet Other:  Follow-up with outpatient providers as arranged by guardian  Alethia Berthold, MD 05/24/2019, 9:34 AM

## 2019-05-24 NOTE — Plan of Care (Signed)
  Problem: Group Participation Goal: STG - Patient will engage in groups without prompting or encouragement from LRT x3 group sessions within 5 recreation therapy group sessions Description: STG - Patient will engage in groups without prompting or encouragement from LRT x3 group sessions within 5 recreation therapy group sessions Outcome: Completed/Met
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
## 2019-05-24 NOTE — Progress Notes (Signed)
Recreation Therapy Notes   Date: 05/24/2019  Time: 9:30 am  Location: Craft room  Behavioral response: Appropriate   Intervention Topic: Goals   Discussion/Intervention:   Group content on today was focused on goals. Patients described what goals are and how they define goals. Individuals expressed how they go about setting goals and reaching them. The group identified how important goals are and if they make short term goals to reach long term goals. Patients described how many goals they work on at a time and what affects them not reaching their goal. Individuals described how much time they put into planning and obtaining their goals. The group participated in the intervention "My Goal Board" and made personal goal boards to help them achieve their goal. Clinical Observations/Feedback:  Patient came to group and was focused on what peers and staff had to say about setting goals. Individual was social with peers and staff while participating in group.  Carl [name] LRT/CTRS         Carl [name] 05/24/2019 1:29 PM

## 2019-05-24 NOTE — Progress Notes (Signed)
  [facility] Adult Case Management Discharge Plan :  Will you be returning to the same living situation after discharge:  No. [facility] At discharge, do you have transportation home?: Yes,  Lydia Guiles, legal guardian will pick up Do you have the ability to pay for your medications: Yes,  Medicaid  Release of information consent forms completed and in the chart;  Patient's signature needed at discharge.  Patient to Follow up at: Follow-up Information    Legal guardian declined Follow up.   Why: Legal guardian is setting the pt up with doctors making house calls.          Next level of care provider has access to [place] and Suicide Prevention discussed: Yes,  Lydia Guiles, legal guardian  Have you used any form of tobacco in the last 30 days? (Cigarettes, Smokeless Tobacco, Cigars, and/or Pipes): No  Has patient been referred to the Quitline?: N/A patient is not a smoker  Patient has been referred for addiction treatment: N/A  Yvette Rack, LCSW 05/24/2019, 9:45 AM

## 2019-05-24 NOTE — Progress Notes (Signed)
Recreation Therapy Notes  INPATIENT RECREATION TR PLAN  Patient Details Name: Carl [name] MRN: [id number] DOB: 1961-02-01 Today's Date: 05/24/2019  Rec Therapy Plan Is patient appropriate for Therapeutic Recreation?: Yes Treatment times per week: at least 3 Estimated Length of Stay: 5-7 days TR Treatment/Interventions: Group participation (Comment)  Discharge Criteria Pt will be discharged from therapy if:: Discharged Treatment plan/goals/alternatives discussed and agreed upon by:: Patient/family  Discharge Summary Short term goals set: Patient will engage in groups without prompting or encouragement from LRT x3 group sessions within 5 recreation therapy group sessions Short term goals met: Adequate for discharge Progress toward goals comments: Groups attended Which groups?: Self-esteem, Goal setting, Other (Comment)(Values, Relaxation,Emotions, Problem Solving) Reason goals not met: N/A Therapeutic equipment acquired: N/A Reason patient discharged from therapy: Discharge from hospital Pt/family agrees with progress & goals achieved: Yes Date patient discharged from therapy: 05/24/19   Vir Whetstine 05/24/2019, 1:28 PM

## 2019-05-24 NOTE — Progress Notes (Signed)
Patient ID: Carl [name], male   DOB: 1960-10-24, 58 y.o.   MRN: [id number]   Discharge Note:  Patient denies SI/HI/AVH at this time. Discharge instructions, AVS, prescriptions, and transition record gone over with patient. Patient agrees to comply with medication management and outpatient therapy. Patient belongings returned to patient. Patient questions and concerns addressed and answered. Patient ambulatory off unit. Patient discharged to APS custody, with Carly, an APS worker.

## 2019-11-18 ENCOUNTER — Inpatient Hospital Stay
Admission: EM | Admit: 2019-11-18 | Discharge: 2019-11-22 | DRG: 190 | Disposition: A | Discharge status: Nursing Home (Includes ICF) | Payer: Medicaid Other | Attending: Internal Medicine | Admitting: Internal Medicine

## 2019-11-18 ENCOUNTER — Other Ambulatory Visit: Payer: Self-pay

## 2019-11-18 ENCOUNTER — Inpatient Hospital Stay
Admit: 2019-11-18 | Discharge: 2019-11-18 | Disposition: A | Discharge status: Home / Self Care | Payer: Medicaid Other | Attending: Internal Medicine | Admitting: Internal Medicine

## 2019-11-18 ENCOUNTER — Emergency Department: Payer: Medicaid Other

## 2019-11-18 DIAGNOSIS — E872 Acidosis: Secondary | ICD-10-CM | POA: Diagnosis present

## 2019-11-18 DIAGNOSIS — I959 Hypotension, unspecified: Secondary | ICD-10-CM | POA: Diagnosis present

## 2019-11-18 DIAGNOSIS — J9621 Acute and chronic respiratory failure with hypoxia: Secondary | ICD-10-CM

## 2019-11-18 DIAGNOSIS — I69322 Dysarthria following cerebral infarction: Secondary | ICD-10-CM

## 2019-11-18 DIAGNOSIS — J441 Chronic obstructive pulmonary disease with (acute) exacerbation: Principal | ICD-10-CM | POA: Diagnosis present

## 2019-11-18 DIAGNOSIS — I447 Left bundle-branch block, unspecified: Secondary | ICD-10-CM | POA: Diagnosis present

## 2019-11-18 DIAGNOSIS — J9601 Acute respiratory failure with hypoxia: Secondary | ICD-10-CM | POA: Diagnosis present

## 2019-11-18 DIAGNOSIS — F319 Bipolar disorder, unspecified: Secondary | ICD-10-CM | POA: Diagnosis present

## 2019-11-18 DIAGNOSIS — Z881 Allergy status to other antibiotic agents status: Secondary | ICD-10-CM

## 2019-11-18 DIAGNOSIS — E785 Hyperlipidemia, unspecified: Secondary | ICD-10-CM | POA: Diagnosis present

## 2019-11-18 DIAGNOSIS — E039 Hypothyroidism, unspecified: Secondary | ICD-10-CM | POA: Diagnosis present

## 2019-11-18 DIAGNOSIS — I252 Old myocardial infarction: Secondary | ICD-10-CM

## 2019-11-18 DIAGNOSIS — I471 Supraventricular tachycardia: Secondary | ICD-10-CM | POA: Diagnosis present

## 2019-11-18 DIAGNOSIS — R0602 Shortness of breath: Secondary | ICD-10-CM | POA: Diagnosis present

## 2019-11-18 DIAGNOSIS — Z951 Presence of aortocoronary bypass graft: Secondary | ICD-10-CM | POA: Diagnosis not present

## 2019-11-18 DIAGNOSIS — K7031 Alcoholic cirrhosis of liver with ascites: Secondary | ICD-10-CM | POA: Diagnosis present

## 2019-11-18 DIAGNOSIS — Z880 Allergy status to penicillin: Secondary | ICD-10-CM

## 2019-11-18 DIAGNOSIS — F1020 Alcohol dependence, uncomplicated: Secondary | ICD-10-CM | POA: Diagnosis present

## 2019-11-18 DIAGNOSIS — Z20822 Contact with and (suspected) exposure to covid-19: Secondary | ICD-10-CM | POA: Diagnosis present

## 2019-11-18 DIAGNOSIS — Z8673 Personal history of transient ischemic attack (TIA), and cerebral infarction without residual deficits: Secondary | ICD-10-CM

## 2019-11-18 DIAGNOSIS — R188 Other ascites: Secondary | ICD-10-CM

## 2019-11-18 DIAGNOSIS — Z91041 Radiographic dye allergy status: Secondary | ICD-10-CM

## 2019-11-18 DIAGNOSIS — Z95810 Presence of automatic (implantable) cardiac defibrillator: Secondary | ICD-10-CM

## 2019-11-18 DIAGNOSIS — Z91012 Allergy to eggs: Secondary | ICD-10-CM

## 2019-11-18 DIAGNOSIS — I2510 Atherosclerotic heart disease of native coronary artery without angina pectoris: Secondary | ICD-10-CM | POA: Diagnosis present

## 2019-11-18 DIAGNOSIS — F17210 Nicotine dependence, cigarettes, uncomplicated: Secondary | ICD-10-CM | POA: Diagnosis present

## 2019-11-18 DIAGNOSIS — Z882 Allergy status to sulfonamides status: Secondary | ICD-10-CM | POA: Diagnosis not present

## 2019-11-18 DIAGNOSIS — Z79899 Other long term (current) drug therapy: Secondary | ICD-10-CM

## 2019-11-18 DIAGNOSIS — I5043 Acute on chronic combined systolic (congestive) and diastolic (congestive) heart failure: Secondary | ICD-10-CM | POA: Diagnosis present

## 2019-11-18 DIAGNOSIS — Z7982 Long term (current) use of aspirin: Secondary | ICD-10-CM

## 2019-11-18 DIAGNOSIS — E871 Hypo-osmolality and hyponatremia: Secondary | ICD-10-CM | POA: Diagnosis present

## 2019-11-18 DIAGNOSIS — Z79890 Hormone replacement therapy (postmenopausal): Secondary | ICD-10-CM

## 2019-11-18 DIAGNOSIS — I110 Hypertensive heart disease with heart failure: Secondary | ICD-10-CM | POA: Diagnosis present

## 2019-11-18 DIAGNOSIS — I10 Essential (primary) hypertension: Secondary | ICD-10-CM | POA: Diagnosis present

## 2019-11-18 DIAGNOSIS — I5023 Acute on chronic systolic (congestive) heart failure: Secondary | ICD-10-CM

## 2019-11-18 DIAGNOSIS — N179 Acute kidney failure, unspecified: Secondary | ICD-10-CM | POA: Diagnosis present

## 2019-11-18 DIAGNOSIS — Z7902 Long term (current) use of antithrombotics/antiplatelets: Secondary | ICD-10-CM

## 2019-11-18 DIAGNOSIS — R0902 Hypoxemia: Secondary | ICD-10-CM | POA: Diagnosis present

## 2019-11-18 DIAGNOSIS — Z9102 Food additives allergy status: Secondary | ICD-10-CM

## 2019-11-18 DIAGNOSIS — R778 Other specified abnormalities of plasma proteins: Secondary | ICD-10-CM

## 2019-11-18 LAB — COMPREHENSIVE METABOLIC PANEL
ALT: 23 U/L (ref 0–44)
AST: 35 U/L (ref 15–41)
Albumin: 3.4 g/dL — ABNORMAL LOW (ref 3.5–5.0)
Alkaline Phosphatase: 62 U/L (ref 38–126)
Anion gap: 13 (ref 5–15)
BUN: 13 mg/dL (ref 8–23)
CO2: 18 mmol/L — ABNORMAL LOW (ref 22–32)
Calcium: 8.7 mg/dL — ABNORMAL LOW (ref 8.9–10.3)
Chloride: 109 mmol/L (ref 98–111)
Creatinine, Ser: 1.28 mg/dL — ABNORMAL HIGH (ref 0.61–1.24)
GFR calc Af Amer: >60 mL/min (ref >60)
GFR calc non Af Amer: >60 mL/min (ref >60)
Glucose, Bld: 187 mg/dL — ABNORMAL HIGH (ref 70–99)
Potassium: 4.5 mmol/L (ref 3.5–5.1)
Sodium: 140 mmol/L (ref 135–145)
Total Bilirubin: 0.7 mg/dL (ref 0.3–1.2)
Total Protein: 6.6 g/dL (ref 6.5–8.1)

## 2019-11-18 LAB — CBC WITH DIFFERENTIAL/PLATELET
Abs Immature Granulocytes: 0.15 10*3/uL — ABNORMAL HIGH (ref 0.00–0.07)
Basophils Absolute: 0.1 10*3/uL (ref 0.0–0.1)
Basophils Relative: 1 %
Eosinophils Absolute: 0.5 10*3/uL (ref 0.0–0.5)
Eosinophils Relative: 6 %
HCT: 39.8 % (ref 39.0–52.0)
Hemoglobin: 12.6 g/dL — ABNORMAL LOW (ref 13.0–17.0)
Immature Granulocytes: 2 %
Lymphocytes Relative: 29 %
Lymphs Abs: 2.4 10*3/uL (ref 0.7–4.0)
MCH: 29.6 pg (ref 26.0–34.0)
MCHC: 31.7 g/dL (ref 30.0–36.0)
MCV: 93.4 fL (ref 80.0–100.0)
Monocytes Absolute: 0.7 10*3/uL (ref 0.1–1.0)
Monocytes Relative: 8 %
Neutro Abs: 4.6 10*3/uL (ref 1.7–7.7)
Neutrophils Relative %: 54 %
Platelets: 218 10*3/uL (ref 150–400)
RBC: 4.26 MIL/uL (ref 4.22–5.81)
RDW: 15.6 % — ABNORMAL HIGH (ref 11.5–15.5)
WBC: 8.3 10*3/uL (ref 4.0–10.5)
nRBC: 0 % (ref 0.0–0.2)

## 2019-11-18 LAB — BLOOD GAS, VENOUS
Acid-base deficit: 9 mmol/L — ABNORMAL HIGH (ref 0.0–2.0)
Bicarbonate: 18.8 mmol/L — ABNORMAL LOW (ref 20.0–28.0)
Delivery systems: POSITIVE
FIO2: 0.4
Mechanical Rate: 12
O2 Saturation: 76.7 %
Patient temperature: 37
RATE: 12 resp/min
pCO2, Ven: 47 mmHg (ref 44.0–60.0)
pH, Ven: 7.21 — ABNORMAL LOW (ref 7.250–7.430)
pO2, Ven: 51 mmHg — ABNORMAL HIGH (ref 32.0–45.0)

## 2019-11-18 LAB — TROPONIN I (HIGH SENSITIVITY)
Troponin I (High Sensitivity): 49 ng/L — ABNORMAL HIGH (ref <18)
Troponin I (High Sensitivity): 106 ng/L (ref <18)
Troponin I (High Sensitivity): 73 ng/L — ABNORMAL HIGH (ref <18)
Troponin I (High Sensitivity): 67 ng/L — ABNORMAL HIGH (ref <18)

## 2019-11-18 LAB — HIV ANTIBODY (ROUTINE TESTING W REFLEX): HIV Screen 4th Generation wRfx: NONREACTIVE

## 2019-11-18 LAB — MAGNESIUM: Magnesium: 1.9 mg/dL (ref 1.7–2.4)

## 2019-11-18 LAB — BRAIN NATRIURETIC PEPTIDE: B Natriuretic Peptide: 1292 pg/mL — ABNORMAL HIGH (ref 0.0–100.0)

## 2019-11-18 LAB — SARS CORONAVIRUS 2 BY RT PCR (HOSPITAL ORDER, PERFORMED IN ~~LOC~~ HOSPITAL LAB): SARS Coronavirus 2: NEGATIVE

## 2019-11-18 MED ORDER — IPRATROPIUM-ALBUTEROL 0.5-2.5 (3) MG/3ML IN SOLN
3.0000 mL | Freq: Once | RESPIRATORY_TRACT | Status: AC
  Administered 2019-11-18: 3 mL via RESPIRATORY_TRACT

## 2019-11-18 MED ORDER — IPRATROPIUM-ALBUTEROL 0.5-2.5 (3) MG/3ML IN SOLN
3.0000 mL | Freq: Four times a day (QID) | RESPIRATORY_TRACT | Status: DC
  Administered 2019-11-18 – 2019-11-21 (×13): 3 mL via RESPIRATORY_TRACT
  Filled 2019-11-18 (×13): qty 3

## 2019-11-18 MED ORDER — FUROSEMIDE 10 MG/ML IJ SOLN
20.0000 mg | Freq: Two times a day (BID) | INTRAMUSCULAR | Status: DC
  Administered 2019-11-18 – 2019-11-22 (×9): 20 mg via INTRAVENOUS
  Filled 2019-11-18: qty 4
  Filled 2019-11-18 (×4): qty 2
  Filled 2019-11-18: qty 4
  Filled 2019-11-18 (×2): qty 2
  Filled 2019-11-18: qty 4
  Filled 2019-11-18: qty 2

## 2019-11-18 MED ORDER — LISINOPRIL 5 MG PO TABS
2.5000 mg | ORAL_TABLET | Freq: Every day | ORAL | Status: DC
  Administered 2019-11-18 – 2019-11-22 (×5): 2.5 mg via ORAL
  Filled 2019-11-18 (×5): qty 1

## 2019-11-18 MED ORDER — ALBUTEROL SULFATE (2.5 MG/3ML) 0.083% IN NEBU
2.5000 mg | INHALATION_SOLUTION | RESPIRATORY_TRACT | Status: DC | PRN
  Administered 2019-11-19: 2.5 mg via RESPIRATORY_TRACT
  Filled 2019-11-18: qty 3

## 2019-11-18 MED ORDER — METHYLPREDNISOLONE SODIUM SUCC 40 MG IJ SOLR
40.0000 mg | Freq: Four times a day (QID) | INTRAMUSCULAR | Status: AC
  Administered 2019-11-18 (×3): 40 mg via INTRAVENOUS
  Filled 2019-11-18 (×3): qty 1

## 2019-11-18 MED ORDER — LEVOTHYROXINE SODIUM 25 MCG PO TABS
25.0000 ug | ORAL_TABLET | Freq: Every day | ORAL | Status: DC
  Administered 2019-11-18 – 2019-11-21 (×4): 25 ug via ORAL
  Filled 2019-11-18 (×4): qty 1

## 2019-11-18 MED ORDER — ROSUVASTATIN CALCIUM 20 MG PO TABS
40.0000 mg | ORAL_TABLET | Freq: Every day | ORAL | Status: DC
  Filled 2019-11-18: qty 2

## 2019-11-18 MED ORDER — METHYLPREDNISOLONE SODIUM SUCC 125 MG IJ SOLR
125.0000 mg | Freq: Once | INTRAMUSCULAR | Status: AC
  Administered 2019-11-18: 125 mg via INTRAVENOUS
  Filled 2019-11-18: qty 2

## 2019-11-18 MED ORDER — PREDNISONE 20 MG PO TABS
40.0000 mg | ORAL_TABLET | Freq: Every day | ORAL | Status: AC
  Administered 2019-11-19 – 2019-11-22 (×4): 40 mg via ORAL
  Filled 2019-11-18 (×4): qty 2

## 2019-11-18 MED ORDER — IPRATROPIUM-ALBUTEROL 0.5-2.5 (3) MG/3ML IN SOLN
RESPIRATORY_TRACT | Status: AC
  Filled 2019-11-18: qty 6

## 2019-11-18 MED ORDER — QUETIAPINE FUMARATE 100 MG PO TABS
100.0000 mg | ORAL_TABLET | Freq: Every day | ORAL | Status: DC
  Administered 2019-11-18 – 2019-11-21 (×4): 100 mg via ORAL
  Filled 2019-11-18: qty 1
  Filled 2019-11-18 (×2): qty 4
  Filled 2019-11-18 (×5): qty 1

## 2019-11-18 MED ORDER — DOXYCYCLINE HYCLATE 100 MG PO TABS
100.0000 mg | ORAL_TABLET | Freq: Two times a day (BID) | ORAL | Status: DC
  Administered 2019-11-18 – 2019-11-22 (×9): 100 mg via ORAL
  Filled 2019-11-18 (×9): qty 1

## 2019-11-18 MED ORDER — ENOXAPARIN SODIUM 40 MG/0.4ML ~~LOC~~ SOLN
40.0000 mg | SUBCUTANEOUS | Status: DC
  Administered 2019-11-18 – 2019-11-22 (×5): 40 mg via SUBCUTANEOUS
  Filled 2019-11-18 (×5): qty 0.4

## 2019-11-18 MED ORDER — FUROSEMIDE 10 MG/ML IJ SOLN
40.0000 mg | Freq: Once | INTRAMUSCULAR | Status: AC
  Administered 2019-11-18: 40 mg via INTRAVENOUS
  Filled 2019-11-18: qty 4

## 2019-11-18 MED ORDER — ROSUVASTATIN CALCIUM 10 MG PO TABS
40.0000 mg | ORAL_TABLET | Freq: Every day | ORAL | Status: DC
  Administered 2019-11-18 – 2019-11-21 (×4): 40 mg via ORAL
  Filled 2019-11-18: qty 4
  Filled 2019-11-18: qty 2
  Filled 2019-11-18: qty 4
  Filled 2019-11-18: qty 2
  Filled 2019-11-18: qty 4

## 2019-11-18 MED ORDER — FUROSEMIDE 10 MG/ML IJ SOLN
40.0000 mg | Freq: Once | INTRAMUSCULAR | Status: DC
  Filled 2019-11-18: qty 4

## 2019-11-18 MED ORDER — CARVEDILOL 6.25 MG PO TABS
6.2500 mg | ORAL_TABLET | Freq: Two times a day (BID) | ORAL | Status: DC
  Administered 2019-11-18 – 2019-11-22 (×9): 6.25 mg via ORAL
  Filled 2019-11-18 (×9): qty 1

## 2019-11-18 MED ORDER — DIVALPROEX SODIUM 500 MG PO DR TAB
500.0000 mg | DELAYED_RELEASE_TABLET | Freq: Two times a day (BID) | ORAL | Status: DC
  Administered 2019-11-18 – 2019-11-22 (×9): 500 mg via ORAL
  Filled 2019-11-18 (×10): qty 1

## 2019-11-18 MED ORDER — ASPIRIN EC 81 MG PO TBEC
81.0000 mg | DELAYED_RELEASE_TABLET | Freq: Every day | ORAL | Status: DC
  Administered 2019-11-18 – 2019-11-22 (×5): 81 mg via ORAL
  Filled 2019-11-18 (×5): qty 1

## 2019-11-18 MED ORDER — CLOPIDOGREL BISULFATE 75 MG PO TABS
75.0000 mg | ORAL_TABLET | Freq: Every day | ORAL | Status: DC
  Administered 2019-11-18 – 2019-11-22 (×5): 75 mg via ORAL
  Filled 2019-11-18 (×5): qty 1

## 2019-11-18 NOTE — Progress Notes (Signed)
OT Cancellation Note  Patient Details Name: Carl [name] MRN: [id number] DOB: 08/23/1960   Cancelled Treatment:    Reason Eval/Treat Not Completed: Other (comment). Order received and chart reviewed. Pt noted to be pending transfer to medical unit. Will hold OT evaluation until pt up to floor and POC established. Will continue to follow remotely and initiate services as available and pt medically appropriate for OT services. Thank you.    Rockney Ghee, M.S., OTR/L Ascom: [phone] 11/18/19, 12:51 PM

## 2019-11-18 NOTE — ED Notes (Signed)
Pt [facility] on stretcher in exam room with no distress noted; pt assisted into hosp gown; RT at bedside to adjust bipap mask for comfort; pt denies any c/o at present

## 2019-11-18 NOTE — ED Provider Notes (Signed)
[facility] Emergency Department Provider Note  ____________________________________________   First MD Initiated Contact with Patient 11/18/19 [phone]     (approximate)  I have reviewed the triage vital signs and the nursing notes.   HISTORY  Chief Complaint No chief complaint on file.    HPI Carl [name] is a 59 y.o. male with history of congestive heart failure COPD MI previous CABG presents to the emergency department via EMS secondary to acute onset of respiratory distress.  Patient was apparently outside of the facility where he resides smoking a cigarette when he abruptly started having shortness of breath.  Patient denies any chest pain.  EMS states that the patient's oxygen saturation was in the 40s on their arrival with apparent respiratory distress.  As such CPAP was applied        Past Medical History:  Diagnosis Date  . Acute pulmonary edema (HCC) 2017  . Acute respiratory failure with hypoxia (HCC) 2017  . Bipolar 1 disorder (HCC)   . COPD (chronic obstructive pulmonary disease) (HCC)   . Depression   . Hypertension   . Myocardial infarction (HCC)   . NSTEMI (non-ST elevated myocardial infarction) (HCC)   . 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 Seizures (HCC)    childhood  . Stroke (HCC)   . Tobacco abuse     Patient Active Problem List   Diagnosis Date Noted  . Acute on chronic systolic CHF (congestive heart failure) (HCC) 11/18/2019  . 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 Elevated troponin 11/18/2019  . Hypoxia 11/18/2019  . AKI (acute kidney injury) (HCC) 11/18/2019  . Coronary artery disease 05/15/2019  . Essential hypertension 05/15/2019  . History of stroke 05/15/2019  . Homicidal ideation 05/15/2019  . Hypothyroidism 05/15/2019  . COPD exacerbation (HCC) 03/22/2019  . Bipolar 1 disorder (HCC) 12/23/2018  . Acute respiratory failure with hypoxia (HCC) 11/03/2018      No past surgical history on file.  Prior to Admission medications   Medication Sig Start Date End Date Taking? Authorizing Provider  albuterol (VENTOLIN HFA) 108 (90 Base) MCG/ACT inhaler Inhale 2 puffs into the lungs every 6 (six) hours as needed for wheezing. 05/24/19 01/08/20  Clapacs, Jackquline Denmark, MD  aspirin EC 81 MG tablet Take 1 tablet (81 mg total) by mouth daily. 05/24/19   Clapacs, Jackquline Denmark, MD  carvedilol (COREG) 6.25 MG tablet Take 1 tablet (6.25 mg total) by mouth 2 (two) times daily. 05/24/19 12/04/19  Clapacs, Jackquline Denmark, MD  clopidogrel (PLAVIX) 75 MG tablet Take 1 tablet (75 mg total) by mouth daily. 05/25/19   Clapacs, Jackquline Denmark, MD  divalproex (DEPAKOTE ER) 500 MG 24 hr tablet Take 1 tablet (500 mg total) by mouth 2 (two) times daily. 05/24/19   Clapacs, Jackquline Denmark, MD  escitalopram (LEXAPRO) 10 MG tablet Take 1 tablet (10 mg total) by mouth at bedtime. 05/24/19   Clapacs, Jackquline Denmark, MD  levothyroxine (SYNTHROID) 25 MCG tablet Take 1 tablet (25 mcg total) by mouth daily. 05/24/19 01/08/20  Clapacs, Jackquline Denmark, MD  lisinopril (ZESTRIL) 2.5 MG tablet Take 1 tablet (2.5 mg total) by mouth daily. 05/24/19 01/08/20  Clapacs, Jackquline Denmark, MD  QUEtiapine (SEROQUEL) 50 MG tablet Take 1 tablet (50 mg total) by mouth every morning. 05/25/19   Clapacs, Jackquline Denmark, MD    Allergies Iodinated diagnostic agents, Penicillins, Strawberry extract, Cefepime, Eggs or egg-derived products, Erythromycin, and Sulfa antibiotics  No family history on file.  Social  History Social History   Tobacco Use  . Smoking status: Former Smoker    Packs/day: 0.00    Years: 0.00    Pack years: 0.00  . Smokeless tobacco: Never Used  Substance Use Topics  . Alcohol use: Not Currently  . Drug use: Never    Review of Systems Constitutional: No fever/chills Eyes: No visual changes. ENT: No sore throat. Cardiovascular: Denies chest pain. Respiratory: Positive for shortness of breath. Gastrointestinal: No abdominal pain.  No nausea, no  vomiting.  No diarrhea.  No constipation. Genitourinary: Negative for dysuria. Musculoskeletal: Negative for neck pain.  Negative for back pain. Integumentary: Negative for rash. Neurological: Negative for headaches, focal weakness or numbness.   ____________________________________________   PHYSICAL EXAM:  VITAL SIGNS: ED Triage Vitals [11/18/19 0241]  Enc Vitals Group     BP      Pulse Rate (!) 144     Resp (!) 43     Temp      Temp src      SpO2 99 %     Weight      Height      Head Circumference      Peak Flow      Pain Score      Pain Loc      Pain Edu?      Excl. in GC?    Constitutional: Alert and oriented.  Apparent respiratory distress Eyes: Conjunctivae are normal.  Mouth/Throat: Patient is wearing a mask. Neck: No stridor.  No meningeal signs.   Cardiovascular: Tachycardia, regular rhythm. Good peripheral circulation. Grossly normal heart sounds. Respiratory: Tachypnea, diffuse expiratory wheezing and rhonchi, positive accessory respiratory muscle use. Gastrointestinal: Soft and nontender. No distention.  Musculoskeletal: No lower extremity tenderness nor edema. No gross deformities of extremities. Neurologic:  Normal speech and language. No gross focal neurologic deficits are appreciated.  Skin:  Skin is warm, dry and intact. Psychiatric: Mood and affect are normal. Speech and behavior are normal.  ____________________________________________   LABS (all labs ordered are listed, but only abnormal results are displayed)  Labs Reviewed  CBC WITH DIFFERENTIAL/PLATELET - Abnormal; Notable for the following components:      Result Value   Hemoglobin 12.6 (*)    RDW 15.6 (*)    Abs Immature Granulocytes 0.15 (*)    All other components within normal limits  BRAIN NATRIURETIC PEPTIDE - Abnormal; Notable for the following components:   B Natriuretic Peptide 1,292.0 (*)    All other components within normal limits  COMPREHENSIVE METABOLIC PANEL - Abnormal;  Notable for the following components:   CO2 18 (*)    Glucose, Bld 187 (*)    Creatinine, Ser 1.28 (*)    Calcium 8.7 (*)    Albumin 3.4 (*)    All other components within normal limits  BLOOD GAS, VENOUS - Abnormal; Notable for the following components:   pH, Ven 7.21 (*)    pO2, Ven 51.0 (*)    Bicarbonate 18.8 (*)    Acid-base deficit 9.0 (*)    All other components within normal limits  TROPONIN I (HIGH SENSITIVITY) - Abnormal; Notable for the following components:   Troponin I (High Sensitivity) 49 (*)    All other components within normal limits  TROPONIN I (HIGH SENSITIVITY) - Abnormal; Notable for the following components:   Troponin I (High Sensitivity) 106 (*)    All other components within normal limits  SARS CORONAVIRUS 2 BY RT PCR (HOSPITAL ORDER, PERFORMED IN [facility])  MAGNESIUM  HIV ANTIBODY (ROUTINE TESTING W REFLEX)   ____________________________________________  EKG  ED ECG REPORT I, McLean N Dyan Creelman, the attending physician, personally viewed and interpreted this ECG.   Date: 11/18/2019  EKG Time: 2:42 AM  Rate: 142  Rhythm: Sinus tachycardia left bundle branch block  Axis: Normal  Intervals: Normal  ST&T Change: None  ____________________________________________  RADIOLOGY I, [place] N Deago Burruss, personally viewed and evaluated these images (plain radiographs) as part of my medical decision making, as well as reviewing the written report by the radiologist.  ED MD interpretation: Unchanged cardiomegaly without any pulmonary edema or focal airspace disease on chest x-ray impression per radiologist.  Official radiology report(s): DG Chest Port 1 View  Result Date: 11/18/2019 CLINICAL DATA:  Dyspnea EXAM: PORTABLE CHEST 1 VIEW COMPARISON:  05/19/2019 FINDINGS: Unchanged appearance of left chest wall AICD, median sternotomy wires and left clavicular hardware. There is moderate cardiomegaly, which is unchanged. No pulmonary edema or focal  airspace consolidation. No pleural effusion or pneumothorax. IMPRESSION: Unchanged cardiomegaly without pulmonary edema or focal airspace disease. Electronically Signed   By: Deatra Robinson M.D.   On: 11/18/2019 03:06    ____________________________________________   PROCEDURES     .Critical Care Performed by: Darci Current, MD Authorized by: Darci Current, MD   Critical care provider statement:    Critical care time (minutes):  30   Critical care time was exclusive of:  Separately billable procedures and treating other patients   Critical care was necessary to treat or prevent imminent or life-threatening deterioration of the following conditions:  Respiratory failure   Critical care was time spent personally by me on the following activities:  Development of treatment plan with patient or surrogate, discussions with consultants, evaluation of patient's response to treatment, examination of patient, obtaining history from patient or surrogate, ordering and performing treatments and interventions, ordering and review of laboratory studies, ordering and review of radiographic studies, pulse oximetry, re-evaluation of patient's condition and review of old charts     ____________________________________________   INITIAL IMPRESSION / MDM / ASSESSMENT AND PLAN / ED COURSE  As part of my medical decision making, I reviewed the following data within the electronic MEDICAL RECORD NUMBER  59 year old male presented with above-stated history and physical exam differential diagnosis including but not limited to COPD exacerbation, CHF exacerbation, ACS pulmonary edema pulmonary emboli.  Patient placed on BiPAP on arrival to the emergency department.  2 duo nebs administered as well as Solu-Medrol 125 mg.  Chest x-ray findings revealed cardiomegaly however no frank pulmonary edema.  Following beforementioned intervention patient's respiratory status markedly improved as such favoring COPD  exacerbation.  Patient discussed with Dr. Para March for hospital admission for further evaluation and management.  ____________________________________________  FINAL CLINICAL IMPRESSION(S) / ED DIAGNOSES  Final diagnoses:  Acute respiratory failure with hypoxia (HCC)     MEDICATIONS GIVEN DURING THIS VISIT:  Medications  furosemide (LASIX) injection 20 mg (has no administration in time range)  enoxaparin (LOVENOX) injection 40 mg (has no administration in time range)  methylPREDNISolone sodium succinate (SOLU-MEDROL) 40 mg/mL injection 40 mg (has no administration in time range)    Followed by  predniSONE (DELTASONE) tablet 40 mg (has no administration in time range)  doxycycline (VIBRA-TABS) tablet 100 mg (has no administration in time range)  ipratropium-albuterol (DUONEB) 0.5-2.5 (3) MG/3ML nebulizer solution 3 mL (3 mLs Nebulization Not Given 11/18/19 0622)  albuterol (PROVENTIL) (2.5 MG/3ML) 0.083% nebulizer solution 2.5 mg (has no  administration in time range)  carvedilol (COREG) tablet 6.25 mg (has no administration in time range)  lisinopril (ZESTRIL) tablet 2.5 mg (has no administration in time range)  levothyroxine (SYNTHROID) tablet 25 mcg (has no administration in time range)  clopidogrel (PLAVIX) tablet 75 mg (has no administration in time range)  ipratropium-albuterol (DUONEB) 0.5-2.5 (3) MG/3ML nebulizer solution 3 mL (3 mLs Nebulization Given 11/18/19 0254)  ipratropium-albuterol (DUONEB) 0.5-2.5 (3) MG/3ML nebulizer solution 3 mL (3 mLs Nebulization Given 11/18/19 0255)  methylPREDNISolone sodium succinate (SOLU-MEDROL) 125 mg/2 mL injection 125 mg (125 mg Intravenous Given 11/18/19 0259)  furosemide (LASIX) injection 40 mg (40 mg Intravenous Given 11/18/19 0542)     ED Discharge Orders    None      *Please note:  Alfie Rideaux was evaluated in Emergency Department on 11/18/2019 for the symptoms described in the history of present illness. He was evaluated in the context  of the global COVID-19 pandemic, which necessitated consideration that the patient might be at risk for infection with the SARS-CoV-2 virus that causes COVID-19. Institutional protocols and algorithms that pertain to the evaluation of patients at risk for COVID-19 are in a state of rapid change based on information released by regulatory bodies including the CDC and federal and state organizations. These policies and algorithms were followed during the patient's care in the ED.  Some ED evaluations and interventions may be delayed as a result of limited staffing during the pandemic.*  Note:  This document was prepared using Dragon voice recognition software and may include unintentional dictation errors.   Gregor Hams, MD 11/18/19 [phone]

## 2019-11-18 NOTE — Progress Notes (Signed)
*  PRELIMINARY RESULTS* Echocardiogram 2D Echocardiogram has been performed.  Joanette Gula Samule Life 11/18/2019, 9:44 AM

## 2019-11-18 NOTE — Progress Notes (Signed)
Pt taken off bipap and placed on 4lpm [place], respiratory rate 18/min, sats 98%. Pt stated that he does not have any shortness of breath at this time. Will continue to monitor RN notified.

## 2019-11-18 NOTE — ED Notes (Signed)
Pt titrated to 2L [place]. 95% SpO2

## 2019-11-18 NOTE — ED Triage Notes (Signed)
Pt to ED via [facility] from [facility]. Per EMS pt c/o SOB. Initial RA sats 46% and pt placed on CPAP with improvement to 94%. No meds given in route.   BP 118/68 CBG 221  Pt with hx COPD, CHF and pacemaker in place.

## 2019-11-18 NOTE — H&P (Signed)
History and Physical    Azazel Franze ZHG:[id number] DOB: 1960/08/13 DOA: 11/18/2019  PCP: [facility]   Patient coming from: Assisted living facility I have personally briefly reviewed patient's old medical records in [facility]  Chief Complaint: Shortness of breath  HPI: Vasili Fok is a 59 y.o. male with medical history significant for CAD with history of CABG, systolic heart failure status post AICD, COPD, hypertension, bipolar mood disorder and history of CVA and nicotine dependence who presents to the emergency room after he was awoken suddenly from his sleep with severe shortness of breath.  He said it felt like the last time he had heart failure.  He denied chest pain.  He denied pain or swelling in the legs.  Has had no cough fever or chills...  On arrival of EMS he was in severe respiratory distress with O2 sat was 40% on room air improving to 94% after they applied CPAP.  History limited due to acuity of his condition  ED Course: By arrival in the ER O2 sat was in the 90s and maintained at 100 after he was transitioned to BiPAP.  VBG done at the time of application of BiPAP revealed pH of 7.21, PO2 of 57 and PCO2 of 47.  Blood work significant for BNP of 1000 292, troponin 49.  Bicarb 18, creatinine 1.28, WBC 8.9.  EKG showed no acute ST-T wave changes and old left bundle branch block.  Chest x-ray showed unchanged cardiomegaly without pulmonary edema or focal airspace disease.  Patient was treated with repeat duo nebs and given IV Solu-Medrol.  Hospitalist consulted for admission.  Review of Systems: As per HPI otherwise 10 point review of systems negative.    Past Medical History:  Diagnosis Date  . Acute pulmonary edema (HCC) 2017  . Acute respiratory failure with hypoxia (HCC) 2017  . Bipolar 1 disorder (HCC)   . COPD (chronic obstructive pulmonary disease) (HCC)   . Depression   . Hypertension   . Myocardial infarction (HCC)   . NSTEMI (non-ST elevated  myocardial infarction) (HCC)   . 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 Seizures (HCC)    childhood  . Stroke (HCC)   . Tobacco abuse     No past surgical history on file.   reports that he has quit smoking. He smoked 0.00 packs per day for 0.00 years. He has never used smokeless tobacco. He reports previous alcohol use. He reports that he does not use drugs.  Allergies  Allergen Reactions  . Iodinated Diagnostic Agents Shortness Of Breath  . Penicillins Anaphylaxis and Shortness Of Breath    Respiratory  Tolerated cefuroxime on 01/06/16  . Strawberry Extract Anaphylaxis  . Cefepime Itching    Empiric antibiotic, developed pruritis.   . Eggs Or Egg-Derived Products Itching    Medicine related, pt tolerates ingestion of eggs  . Erythromycin Itching  . Sulfa Antibiotics Itching, Nausea And Vomiting and Nausea Only    No family history on file.   Prior to Admission medications   Medication Sig Start Date End Date Taking? Authorizing Provider  albuterol (VENTOLIN HFA) 108 (90 Base) MCG/ACT inhaler Inhale 2 puffs into the lungs every 6 (six) hours as needed for wheezing. 05/24/19 01/08/20  Clapacs, Jackquline Denmark, MD  aspirin EC 81 MG tablet Take 1 tablet (81 mg total) by mouth daily. 05/24/19   Clapacs, Jackquline Denmark, MD  carvedilol (COREG) 6.25 MG tablet Take 1 tablet (6.25 mg total) by mouth  2 (two) times daily. 05/24/19 12/04/19  Clapacs, Madie Reno, MD  clopidogrel (PLAVIX) 75 MG tablet Take 1 tablet (75 mg total) by mouth daily. 05/25/19   Clapacs, Madie Reno, MD  divalproex (DEPAKOTE ER) 500 MG 24 hr tablet Take 1 tablet (500 mg total) by mouth 2 (two) times daily. 05/24/19   Clapacs, Madie Reno, MD  escitalopram (LEXAPRO) 10 MG tablet Take 1 tablet (10 mg total) by mouth at bedtime. 05/24/19   Clapacs, Madie Reno, MD  levothyroxine (SYNTHROID) 25 MCG tablet Take 1 tablet (25 mcg total) by mouth daily. 05/24/19 01/08/20  Clapacs, Madie Reno, MD  lisinopril (ZESTRIL) 2.5 MG tablet Take 1  tablet (2.5 mg total) by mouth daily. 05/24/19 01/08/20  Clapacs, Madie Reno, MD  QUEtiapine (SEROQUEL) 50 MG tablet Take 1 tablet (50 mg total) by mouth every morning. 05/25/19   Clapacs, Madie Reno, MD    Physical Exam: Vitals:   11/18/19 0241 11/18/19 0242 11/18/19 0246 11/18/19 0247  BP:   116/72   Pulse: (!) 144   82  Resp: (!) 43     SpO2: 99% 99%    Weight:   91.6 kg   Height:   5\' 7"  (1.702 m)      Vitals:   11/18/19 0241 11/18/19 0242 11/18/19 0246 11/18/19 0247  BP:   116/72   Pulse: (!) 144   82  Resp: (!) 43     SpO2: 99% 99%    Weight:   91.6 kg   Height:   5\' 7"  (1.702 m)     Constitutional: Alert and awake, oriented x3, mild conversational dyspnea Eyes: PERLA, EOMI, irises appear normal, anicteric sclera,  ENMT: external ears and nose appear normal, normal hearing  Neck: neck appears normal, no masses, normal ROM, no thyromegaly, no JVD  CVS: S1-S2 clear, no murmur rubs or gallops,  , no carotid bruits, pedal pulses palpable, No LE edema Respiratory: Bilateral rhonchi, rales or rhonchi. Respiratory effort increased.  On BiPAP. No accessory muscle use.  Abdomen: soft nontender, nondistended, normal bowel sounds, no hepatosplenomegaly, no hernias Musculoskeletal: : no cyanosis, clubbing , no contractures or atrophy Neuro: No gross focal neurologic deficits Psych: judgement and insight appear normal, stable mood and affect,  Skin: no rashes or lesions or ulcers, no induration or nodules   Labs on Admission: I have personally reviewed following labs and imaging studies  CBC: Recent Labs  Lab 11/18/19 0249  WBC 8.3  NEUTROABS 4.6  HGB 12.6*  HCT 39.8  MCV 93.4  PLT 175   Basic Metabolic Panel: Recent Labs  Lab 11/18/19 0249  NA 140  K 4.5  CL 109  CO2 18*  GLUCOSE 187*  BUN 13  CREATININE 1.28*  CALCIUM 8.7*   GFR: Estimated Creatinine Clearance: 65.4 mL/min (A) (by C-G formula based on SCr of 1.28 mg/dL (H)). Liver Function Tests: Recent Labs  Lab  11/18/19 0249  AST 35  ALT 23  ALKPHOS 62  BILITOT 0.7  PROT 6.6  ALBUMIN 3.4*   No results for input(s): LIPASE, AMYLASE in the last 168 hours. No results for input(s): AMMONIA in the last 168 hours. Coagulation Profile: No results for input(s): INR, PROTIME in the last 168 hours. Cardiac Enzymes: No results for input(s): CKTOTAL, CKMB, CKMBINDEX, TROPONINI in the last 168 hours. BNP (last 3 results) No results for input(s): PROBNP in the last 8760 hours. HbA1C: No results for input(s): HGBA1C in the last 72 hours. CBG: No results for input(s): GLUCAP  in the last 168 hours. Lipid Profile: No results for input(s): CHOL, HDL, LDLCALC, TRIG, CHOLHDL, LDLDIRECT in the last 72 hours. Thyroid Function Tests: No results for input(s): TSH, T4TOTAL, FREET4, T3FREE, THYROIDAB in the last 72 hours. Anemia Panel: No results for input(s): VITAMINB12, FOLATE, FERRITIN, TIBC, IRON, RETICCTPCT in the last 72 hours. Urine analysis:    Component Value Date/Time   COLORURINE YELLOW (A) 12/23/2018 1648   APPEARANCEUR CLEAR (A) 12/23/2018 1648   LABSPEC 1.016 12/23/2018 1648   PHURINE 6.0 12/23/2018 1648   GLUCOSEU NEGATIVE 12/23/2018 1648   HGBUR SMALL (A) 12/23/2018 1648   BILIRUBINUR NEGATIVE 12/23/2018 1648   KETONESUR NEGATIVE 12/23/2018 1648   PROTEINUR NEGATIVE 12/23/2018 1648   NITRITE NEGATIVE 12/23/2018 1648   LEUKOCYTESUR NEGATIVE 12/23/2018 1648    Radiological Exams on Admission: DG Chest Port 1 View  Result Date: 11/18/2019 CLINICAL DATA:  Dyspnea EXAM: PORTABLE CHEST 1 VIEW COMPARISON:  05/19/2019 FINDINGS: Unchanged appearance of left chest wall AICD, median sternotomy wires and left clavicular hardware. There is moderate cardiomegaly, which is unchanged. No pulmonary edema or focal airspace consolidation. No pleural effusion or pneumothorax. IMPRESSION: Unchanged cardiomegaly without pulmonary edema or focal airspace disease. Electronically Signed   By: Deatra Robinson M.D.    On: 11/18/2019 03:06    EKG: Independently reviewed.   Assessment/Plan    Acute respiratory failure with hypoxia (HCC) -Patient presented with sudden onset shortness of breath with O2 sat of 40% on room air and extreme work of breathing, currently on BiPAP with VBG showing respiratory acidosis -Clinical assessment suggests COPD as the main etiology -Some suspicion for mild acute exacerbation of CHF -Acute PE not ruled out but low suspicion given overt wheezing consistent with COPD -Follow-up Covid test.  Airborne precautions pending rule out -Continue BiPAP and wean as tolerated to O2 via nasal cannula -Treat the underlying etiology as outlined below    COPD exacerbation (HCC) -Scheduled and as needed bronchodilator treatments -IV steroids and IV antibiotics -   Acute on chronic systolic CHF (congestive heart failure) (HCC)     AICD -BNP elevated at 1292 but chest x-ray shows no acute airspace disease -Last recorded EF 25 to 30% -Echocardiogram -Low-dose IV Lasix 20 mg twice daily, continue home carvedilol and lisinopril    Elevated troponin, suspect demand ischemia   Coronary artery disease with history of CABG -Troponin elevated at 49, suspect secondary to demand ischemia from increased work of breathing -Patient denies chest pain and EKG with no acute ST-T wave changes though with old left bundle branch block -Continue to trend troponin -Continue home carvedilol, lisinopril, clopidogrel pending med rec  Acute kidney injury -Creatinine 1.28 above baseline of 1.04 -Continue to monitor renal function in view of Lasix therapy    Essential hypertension -Continue home meds pending med rec, carvedilol and lisinopril    History of stroke -Continue clopidogrel.  Not currently on statin but full med rec pending    Hypothyroidism -Continue levothyroxine    DVT prophylaxis: Lovenox  Code Status: full code  Family Communication:  none  Disposition Plan: Back to previous home  environment Consults called: none  Status:obs    Andris Baumann MD [facility]     11/18/2019, 4:39 AM

## 2019-11-18 NOTE — Progress Notes (Signed)
Attempted to call [facility], They had me talk to three different people and at the very end they tell me, we need to call back in the morning to get the [facility].

## 2019-11-18 NOTE — Consult Note (Signed)
CARDIOLOGY CONSULT NOTE               Patient ID: Carl [name] MRN: [id number] DOB/AGE: Jul 06, 1961 59 y.o.  Admit date: 11/18/2019 Referring Physician: Delfino Lovett, MD  Primary Physician: [facility] Primary Cardiologist: [facility] Reason for Consultation: CHF exacerbation   HPI: Carl [name] is a 59 year old male with PMH significant for CAD, MI s/p CABG, NSTEMI, chronic combined systolic and diastolic CHF, SVT, s/p PPM/ACID, COPD, HTN, HLD, CVA, thyroid disease, bipolar 1 disorder and tobacco use who presented to [facility] ED for acute respiratory distress.   The patient lives at [facility] and was apparently smoking outside of the facility and began to become dyspneic. EMS was called and upon their arrival the patient was found to be in respiratory distress with an oxygen saturation in the 40's. He was immediately placed on CPAP and brought to [facility] ED.   ED course: Upon ED arrival the patient tachycardiac and tachypneic. Troponin=49>>106, BNP=1292.0, Creat=1.28, CXR revealed unchanged cardiomegaly without pulmonary edema or focal airspace disease, and his venous blood gas was abnormal. The patient was treated with 40mg  of IV lasix, Duo-Neb and solu-medrol. He was also placed on Bipap.   Upon examination the patient reports to be feeling slightly better and states that he became [facility] this morning around 0900 and it progressively got worse upon arrival to the hospital. The patient denies any chest pain, peripheral edema, palpitations or syncope. He states that he does not know his medication names, as all of his medications are managed by the facility in which he lives. The patient does report that he previously had 10 strokes and has residual dysarthria. The patient is in no apparent distress at this time, appears slightly hypervolemic as his abdomen is distended and taut. He is AOx4, vss, ECG unchanged. Previous Echocardiogram from 10/2018 revealed severely  reduced systolic function with and estimated EF of 20-25%, LV doppler parameters consistent with impaired relaxation and then right ventricle had moderately reduced systolic function with moderately elevated RV pressures.    Review of systems complete and found to be negative unless listed above     Past Medical History:  Diagnosis Date  . Acute pulmonary edema (HCC) 2017  . Acute respiratory failure with hypoxia (HCC) 2017  . Bipolar 1 disorder (HCC)   . COPD (chronic obstructive pulmonary disease) (HCC)   . Depression   . Hypertension   . Myocardial infarction (HCC)   . NSTEMI (non-ST elevated myocardial infarction) (HCC)   . 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 Seizures (HCC)    childhood  . Stroke (HCC)   . Tobacco abuse     No past surgical history on file.  (Not in a hospital admission)  Social History   Socioeconomic History  . Marital status: Widowed    Spouse name: Not on file  . Number of children: Not on file  . Years of education: Not on file  . Highest education level: Not on file  Occupational History  . Not on file  Tobacco Use  . Smoking status: Former Smoker    Packs/day: 0.00    Years: 0.00    Pack years: 0.00  . Smokeless tobacco: Never Used  Substance and Sexual Activity  . Alcohol use: Not Currently  . Drug use: Never  . Sexual activity: Not Currently    Birth control/protection: Abstinence  Other Topics Concern  . Not on file  Social History Narrative  .  Not on file   Social Determinants of Health   Financial Resource Strain:   . Difficulty of Paying Living Expenses:   Food Insecurity:   . Worried About Programme researcher, broadcasting/film/video in the Last Year:   . Barista in the Last Year:   Transportation Needs:   . Freight forwarder (Medical):   [name] Lack of Transportation (Non-Medical):   Physical Activity:   . Days of Exercise per Week:   . Minutes of Exercise per Session:   Stress:   . Feeling of Stress :    Social Connections:   . Frequency of Communication with Friends and Family:   . Frequency of Social Gatherings with Friends and Family:   . Attends Religious Services:   . Active Member of Clubs or Organizations:   . Attends Banker Meetings:   [name] Marital Status:   Intimate Partner Violence:   . Fear of Current or Ex-Partner:   . Emotionally Abused:   [name] Physically Abused:   . Sexually Abused:     No family history on file.    Review of systems complete and found to be negative unless listed above      PHYSICAL EXAM  General: Well developed, well nourished, in no acute distress HEENT:  Normocephalic and atraumatic Neck:  No JVD. Carotid +2. Supple.  Lungs: expiratory wheezes bilaterally to auscultation. Increased effort of breathing. Chest expansion symmetrical. Negative for rales, rhonchi or crackles.  Heart: HRRR . Normal S1 and S2 without gallops or murmurs.  Abdomen: Bowel sounds are positive, abdomen distended and taut and non-tender  Msk:  Back normal. Normal strength and tone for age. Extremities: No clubbing, cyanosis or edema.   Neuro: Alert and oriented X 4. Dysarthria noted.  Psych:  Good affect, responds appropriately  Labs:   Lab Results  Component Value Date   WBC 8.3 11/18/2019   HGB 12.6 (L) 11/18/2019   HCT 39.8 11/18/2019   MCV 93.4 11/18/2019   PLT 218 11/18/2019    Recent Labs  Lab 11/18/19 0249  NA 140  K 4.5  CL 109  CO2 18*  BUN 13  CREATININE 1.28*  CALCIUM 8.7*  PROT 6.6  BILITOT 0.7  ALKPHOS 62  ALT 23  AST 35  GLUCOSE 187*   Lab Results  Component Value Date   TROPONINI 0.08 (HH) 11/03/2018    Lab Results  Component Value Date   CHOL 192 05/15/2019   CHOL 175 12/23/2018   Lab Results  Component Value Date   HDL 46 05/15/2019   HDL 38 (L) 12/23/2018   Lab Results  Component Value Date   LDLCALC 90 05/15/2019   LDLCALC 104 (H) 12/23/2018   Lab Results  Component Value Date   TRIG 281 (H)  05/15/2019   TRIG 167 (H) 12/23/2018   TRIG 220 (H) 11/03/2018   Lab Results  Component Value Date   CHOLHDL 4.2 05/15/2019   CHOLHDL 4.6 12/23/2018   No results found for: LDLDIRECT    Radiology: [facility] Chest Port 1 View  Result Date: 11/18/2019 CLINICAL DATA:  Dyspnea EXAM: PORTABLE CHEST 1 VIEW COMPARISON:  05/19/2019 FINDINGS: Unchanged appearance of left chest wall AICD, median sternotomy wires and left clavicular hardware. There is moderate cardiomegaly, which is unchanged. No pulmonary edema or focal airspace consolidation. No pleural effusion or pneumothorax. IMPRESSION: Unchanged cardiomegaly without pulmonary edema or focal airspace disease. Electronically Signed   By: Deatra Robinson M.D.   On: 11/18/2019  03:06    EKG: atrial paced with LBBB. Non-specific ST-T wave changes.   ASSESSMENT AND PLAN:  Acute on chronic combined systolic and diastolic CHF, fairly stable  -We will continue IV diuresis with Lasix 20mg  twice daily.  -Echocardiogram pending  -Continue lisinopril and coreg. Dependent upon Echo results, we may switch patient to entresto due to reduced EF.  -Strict I&O's, low sodium diet, daily weights.   Elevated Troponin, (initial=49>>106), likely demand ischemia  -Recommend trending troponin.   Acute Respiratory Failure with hypoxia, likely due to COPD exacerbation versus CHF exacerbation, fairly stable  -Agree with current plan with bipap, nebulizers, steroids and supplement oxygen as needed.   -Recommend obtaining Covid-19 test.   CAD s/p CABG, stable  -Continue management with Plavix. We will consider adding aspirin 81mg .   - We will start crestor 40mg .   AKI, reasonably stable  -Recommend trending CMP.   -Continued Gentle diuresis as ordered.   HTN in the presence of episodic hypotension, reasonably stable  -Continue management with lisinopril and coreg.   -VS every 4 hours.   H/o CVA, stable, with residual dysarthria, patient is not on anticoagulant  -We  will start crestor.   Hypothyroidism,s table  -Recommend continuing levothyroxine.     Signed: Wren Gallaga ACNPC-AG 11/18/2019, 9:18 AM

## 2019-11-18 NOTE — ED Notes (Signed)
Pt on 3L  at this time. RR even and unlabored. NAD noted

## 2019-11-18 NOTE — ED Notes (Signed)
Dr Manson Passey notified of pt's BP and lasix held

## 2019-11-18 NOTE — Progress Notes (Signed)
Same day rounding progress note  Seen and examined while in the ED. patient waiting for floor bed.  He wants to get off the BiPAP as it is uncomfortable.  Acute respiratory failure with hypoxia (HCC) -Patient presented with sudden onset shortness of breath with O2 sat of 40% on room air and extreme work of breathing, currently on BiPAP and trying to wean off to 4 L nasal cannula -Likely due to COPD/CHF exacerbation -Acute PE not ruled out but low suspicion given overt wheezing consistent with COPD -Covid negative    COPD exacerbation (HCC) -Scheduled and as needed bronchodilator treatments -IV steroids and IV antibiotics    Acute on chronic systolic CHF (congestive heart failure) (HCC)     AICD -BNP elevated at 1292 but chest x-ray shows no acute airspace disease -Last recorded EF 25 to 30% -Echocardiogram pending -Low-dose IV Lasix 20 mg twice daily, continue home carvedilol and lisinopril -Strict I's and O's, low-sodium diet, daily weights    Elevated troponin, due to demand ischemia   Coronary artery disease with history of CABG -Troponin elevated at 49->106, suspect secondary to demand ischemia from increased work of breathing -Patient denies chest pain and EKG with no acute ST-T wave changes though with old left bundle branch block -Continue to trend troponin -Continue home carvedilol, lisinopril, clopidogrel   Acute kidney injury -Creatinine 1.28 above baseline of 1.04 -Continue to monitor renal function in view of Lasix therapy    Essential hypertension -Continue home meds pending med rec, carvedilol and lisinopril    History of stroke -Continue clopidogrel.  Not currently on statin     Hypothyroidism -Continue levothyroxine    DVT prophylaxis: Lovenox  Code Status: full code  Family Communication:  none  Disposition Plan: Back to previous home environment Consults called:  Cardiology Status:  Inpatient  Time spent: 15 minutes

## 2019-11-18 NOTE — ED Notes (Signed)
Pt given dinner tray.

## 2019-11-19 ENCOUNTER — Inpatient Hospital Stay: Payer: Medicaid Other

## 2019-11-19 ENCOUNTER — Encounter: Payer: Self-pay | Admitting: Internal Medicine

## 2019-11-19 LAB — GLUCOSE, CAPILLARY: Glucose-Capillary: 166 mg/dL — ABNORMAL HIGH (ref 70–99)

## 2019-11-19 LAB — BASIC METABOLIC PANEL
Anion gap: 12 (ref 5–15)
BUN: 31 mg/dL — ABNORMAL HIGH (ref 8–23)
CO2: 26 mmol/L (ref 22–32)
Calcium: 9.3 mg/dL (ref 8.9–10.3)
Chloride: 96 mmol/L — ABNORMAL LOW (ref 98–111)
Creatinine, Ser: 1.3 mg/dL — ABNORMAL HIGH (ref 0.61–1.24)
GFR calc Af Amer: >60 mL/min (ref >60)
GFR calc non Af Amer: 59 mL/min — ABNORMAL LOW (ref >60)
Glucose, Bld: 242 mg/dL — ABNORMAL HIGH (ref 70–99)
Potassium: 4.8 mmol/L (ref 3.5–5.1)
Sodium: 134 mmol/L — ABNORMAL LOW (ref 135–145)

## 2019-11-19 LAB — ECHOCARDIOGRAM COMPLETE
Height: 67 in
Weight: 3232 oz

## 2019-11-19 MED ORDER — ADULT MULTIVITAMIN W/MINERALS CH
1.0000 | ORAL_TABLET | Freq: Every day | ORAL | Status: DC
  Administered 2019-11-20 – 2019-11-22 (×3): 1 via ORAL
  Filled 2019-11-19 (×3): qty 1

## 2019-11-19 MED ORDER — ENSURE ENLIVE PO LIQD
237.0000 mL | Freq: Two times a day (BID) | ORAL | Status: DC
  Administered 2019-11-19 – 2019-11-22 (×6): 237 mL via ORAL

## 2019-11-19 NOTE — Evaluation (Signed)
Physical Therapy Evaluation Patient Details Name: Carl [name] MRN: [id number] DOB: 1961/06/21 Today's Date: 11/19/2019   History of Present Illness  59 y.o. male with medical history significant for CAD with history of CABG, systolic heart failure status post AICD, COPD, hypertension, bipolar mood disorder and history of CVA and nicotine dependence who presents to the emergency room after he was awoken suddenly from his sleep with severe shortness of breath.  He said it felt like the last time he had heart failure.  Aparently sats were in the 40s% before placing CPAP  Clinical Impression  Pt was consistently difficult to keep on task t/o the session, often perseverating on topics unrelated to conversation and difficult to redirect.  However from a PT stand-point he was able to do all mobility and a prolonged bout of ambulation w/o direct assist.  He reports some very mild fatigue with circumambulation of the nurses station but even being on room air his O2 remained in the high 90s.  Pt with some (baseline) expressive aphasia, but does indicate that he is close to his normal, he should be able to return to Assisted Living Facility w/o further PT follow up.     Follow Up Recommendations No PT follow up    Equipment Recommendations  None recommended by PT    Recommendations for Other Services       Precautions / Restrictions Precautions Precautions: Fall Restrictions Weight Bearing Restrictions: No      Mobility  Bed Mobility Overal bed mobility: Modified Independent             General bed mobility comments: Pt was able to easily and confidently get to sitting EOB from supint  Transfers Overall transfer level: Modified independent Equipment used: Rolling walker (2 wheeled)             General transfer comment: Pt was able to rise to standing w/o assist and w/o hesitation.  Did need extra cuing to get him to initiate transition.  Ambulation/Gait Ambulation/Gait  assistance: Supervision Gait Distance (Feet): 200 Feet Assistive device: Rolling walker (2 wheeled)       General Gait Details: Pt was able to circumambulate the nurses' station w/ only light use of walker.  He was on room air the entire time with sats in the high 90s t/o the effort, no shortness of breath and only expected c/o fatigue from longer bout of ambulation.  Pt reports he feels that he is close to his baseline with this effort.   Stairs            Wheelchair Mobility    Modified Rankin (Stroke Patients Only)       Balance Overall balance assessment: Modified Independent                                           Pertinent Vitals/Pain Pain Assessment: (chronic R knee pain (meniscus) )    Home Living Family/patient expects to be discharged to:: Assisted living               Home Equipment: Walker - standard;Cane - single point Additional Comments: Unreliable historian. Tangential speech and perseverating behavior, combined with chronic anomia s/p expressive aphasia CVA in 2018.    Prior Function Level of Independence: Needs assistance   Gait / Transfers Assistance Needed: Pt reports that he is able to get around in the hallway and mobilize  as needed, occasionally able to go on community trips           Hand Dominance   Dominant Hand: Right    Extremity/Trunk Assessment   Upper Extremity Assessment Upper Extremity Assessment: Overall WFL for tasks assessed    Lower Extremity Assessment Lower Extremity Assessment: Overall WFL for tasks assessed       Communication   Communication: Expressive difficulties  Cognition Arousal/Alertness: Awake/alert Behavior During Therapy: Restless Overall Cognitive Status: History of cognitive impairments - at baseline                                 General Comments: unsure of actual baseline, however per previous notes it appears he is near his baseline      General  Comments General comments (skin integrity, edema, etc.): Pt displays only very minimal limp on R, able to maintain consistent cadence, etc with good safety despite poor awareness.    Exercises     Assessment/Plan    PT Assessment Patent does not need any further PT services  PT Problem List Decreased strength;Decreased range of motion;Decreased activity tolerance;Decreased balance;Decreased mobility;Decreased cognition;Decreased knowledge of use of DME;Decreased safety awareness       PT Treatment Interventions      PT Goals (Current goals can be found in the Care Plan section)  Acute Rehab PT Goals Patient Stated Goal: go back to facility PT Goal Formulation: All assessment and education complete, DC therapy    Frequency     Barriers to discharge        Co-evaluation               AM-PAC PT "6 Clicks" Mobility  Outcome Measure Help needed turning from your back to your side while in a flat bed without using bedrails?: None Help needed moving from lying on your back to sitting on the side of a flat bed without using bedrails?: None Help needed moving to and from a bed to a chair (including a wheelchair)?: None Help needed standing up from a chair using your arms (e.g., wheelchair or bedside chair)?: None Help needed to walk in hospital room?: None Help needed climbing 3-5 steps with a railing? : A Little 6 Click Score: 23    End of Session Equipment Utilized During Treatment: Gait belt Activity Tolerance: Patient tolerated treatment well Patient left: with chair alarm set;with call bell/phone within reach Nurse Communication: Mobility status(O2 sats) PT Visit Diagnosis: Difficulty in walking, not elsewhere classified (R26.2);Unsteadiness on feet (R26.81);Muscle weakness (generalized) (M62.81)    Time: 0828-0900 PT Time Calculation (min) (ACUTE ONLY): 32 min   Charges:   PT Evaluation $PT Eval Low Complexity: 1 Low PT Treatments $Gait Training: 8-22 mins         Kreg Shropshire, DPT 11/19/2019, 10:01 AM

## 2019-11-19 NOTE — Plan of Care (Signed)
  Problem: Education: Goal: Knowledge of General Education information will improve Description: Including pain rating scale, medication(s)/side effects and non-pharmacologic comfort measures Outcome: Progressing   Problem: Health Behavior/Discharge Planning: Goal: Ability to manage health-related needs will improve Outcome: Progressing Note: Worked with P.T. today, they recommend no follow-up. Will continue to monitor activity tolerance for the remainder of the shift. Jari Favre [facility]

## 2019-11-19 NOTE — Evaluation (Signed)
Occupational Therapy Evaluation Patient Details Name: Carl [name] MRN: [id number] DOB: 06-07-1961 Today's Date: 11/19/2019    History of Present Illness 59 y.o. male with medical history significant for CAD with history of CABG, systolic heart failure status post AICD, COPD, hypertension, bipolar mood disorder and history of CVA and nicotine dependence who presents to the emergency room after he was awoken suddenly from his sleep with severe shortness of breath.  He said it felt like the last time he had heart failure.  Aparently sats were in the 40s% before placing CPAP   Clinical Impression   Carl [name] was seen for OT evaluation this date. Prior to hospital admission, pt was living in ALF. Pt presents to acute OT grossly SUPERVISION only for functional mobility, standing ADLs, and LBD seated EOB. Pt noted to have expressive aphasia difficulties that pt states is baseline. Pt has no further skilled acute OT needs identified at this time. Will sign off. Please re-consult if new needs arise. Anticipate no OT follow up after discharge.      Follow Up Recommendations  No OT follow up    Equipment Recommendations  None recommended by OT    Recommendations for Other Services       Precautions / Restrictions Precautions Precautions: Fall Restrictions Weight Bearing Restrictions: No      Mobility Bed Mobility Overal bed mobility: Modified Independent             General bed mobility comments: sup<>sit c SUPERVISION   Transfers Overall transfer level: Modified independent               General transfer comment: sit<>stand c no AD and SUPERVISION only     Balance Overall balance assessment: Modified Independent(SUPERVISION for static/dynamic standing at sink)                                         ADL either performed or assessed with clinical judgement   ADL Overall ADL's : At baseline                                        General ADL Comments: SUPERVISION grossly for standing grooming and LBD seated EOB.      Vision Baseline Vision/History: Wears glasses Wears Glasses: At all times       Perception     Praxis      Pertinent Vitals/Pain Pain Assessment: No/denies pain     Hand Dominance Right   Extremity/Trunk Assessment Upper Extremity Assessment Upper Extremity Assessment: Overall WFL for tasks assessed   Lower Extremity Assessment Lower Extremity Assessment: Overall WFL for tasks assessed       Communication Communication Communication: Expressive difficulties   Cognition Arousal/Alertness: Awake/alert Behavior During Therapy: WFL for tasks assessed/performed Overall Cognitive Status: History of cognitive impairments - at baseline                                 General Comments: unsure of actual baseline, however per previous notes it appears he is near his baseline   General Comments       Exercises Exercises: Other exercises Other Exercises Other Exercises: Pt educated re: OT role, energy conservation, home/routines modification Other Exercises: LBD, UBD, simulated grooming sink side, static/dynamic  sitting/standing balance, sup<>sit, sit<>stand   Shoulder Instructions      Home Living Family/patient expects to be discharged to:: Assisted living                             Home Equipment: Walker - standard;Cane - single point   Additional Comments: Pt had difficulty addressing home setup questions 2/2 high distractability      Prior Functioning/Environment Level of Independence: Needs assistance    ADL's / Homemaking Assistance Needed: Pt reports supervision for balance deficits             OT Problem List: Impaired balance (sitting and/or standing);Decreased safety awareness      OT Treatment/Interventions:      OT Goals(Current goals can be found in the care plan section) Acute Rehab OT Goals Patient Stated Goal: go back to  facility OT Goal Formulation: With patient Time For Goal Achievement: 12/03/19 Potential to Achieve Goals: Good  OT Frequency:     Barriers to D/C:            Co-evaluation              AM-PAC OT "6 Clicks" Daily Activity     Outcome Measure Help from another person eating meals?: None Help from another person taking care of personal grooming?: None Help from another person toileting, which includes using toliet, bedpan, or urinal?: A Little Help from another person bathing (including washing, rinsing, drying)?: A Little Help from another person to put on and taking off regular upper body clothing?: None Help from another person to put on and taking off regular lower body clothing?: None 6 Click Score: 22   End of Session    Activity Tolerance: Patient tolerated treatment well Patient left: in bed;with call bell/phone within reach;with bed alarm set  OT Visit Diagnosis: Other abnormalities of gait and mobility (R26.89)                Time: 5643-3295 OT Time Calculation (min): 9 min Charges:  OT General Charges $OT Visit: 1 Visit OT Evaluation $OT Eval Low Complexity: 1 Low  Carl [name], M.S. OTR/L  11/19/19, 3:09 PM

## 2019-11-19 NOTE — Progress Notes (Signed)
Initial Nutrition Assessment  DOCUMENTATION CODES:   Not applicable  INTERVENTION:   Ensure Enlive po BID, each supplement provides 350 kcal and 20 grams of protein  MVI daily   NUTRITION DIAGNOSIS:   Increased nutrient needs related to chronic illness(COPD, CHF) as evidenced by increased estimated needs.  GOAL:   Patient will meet greater than or equal to 90% of their needs  MONITOR:   PO intake, Supplement acceptance, Labs, Weight trends, Skin, I & O's  REASON FOR ASSESSMENT:   Consult Assessment of nutrition requirement/status  ASSESSMENT:   59 year old male with PMH significant for CAD, MI s/p CABG, NSTEMI, chronic combined systolic and diastolic CHF, SVT, s/p PPM/ACID, COPD, HTN, HLD, CVA, thyroid disease, bipolar 1 disorder and tobacco use who was admitted for acute respiratory distress likely secondary to COPD and CHF exacerbation.   Met with pt in room today. Pt is difficult to obtain history from as he gets distracted and gets off topic. Pt reports good appetite and oral intake at baseline. Pt reports that he is tired of the food at his facility because it is always the same. It sounds like pt does try to make heart healthy food choices but he has a lack of understanding of what foods are good and bad choices. Pt reports that he loves chocolate milk and drinks 3 per day. Pt is unable to tell me whether or not he likes supplements. Pt also has poor dentition and reports that he is scheduled to have his teeth removed sometime in the near future. Pt reports that he ate 100% of his meals at breakfast and lunch today. RD will add chocolate Ensure to help pt meet his estimated needs. Pt declines a mechanical soft diet as he reports that he was able to eat a hamburger while he was here.   Pt reports his UBW is ~176lbs. Per chart, pt with weight gain pta.   Medications reviewed and include: aspirin, plavix, doxycycline, lovenox, lasix, prednisone  Labs reviewed: Na 134(L), BUN  31(H), creat 1.30(H)  NUTRITION - FOCUSED PHYSICAL EXAM:    Most Recent Value  Orbital Region  No depletion  Upper Arm Region  Mild depletion  Thoracic and Lumbar Region  No depletion  Buccal Region  No depletion  Temple Region  Mild depletion  Clavicle Bone Region  Mild depletion  Clavicle and Acromion Bone Region  Mild depletion  Scapular Bone Region  No depletion  Dorsal Hand  Mild depletion  Patellar Region  Mild depletion  Anterior Thigh Region  Mild depletion  Posterior Calf Region  Moderate depletion  Edema (RD Assessment)  None  Hair  Reviewed  Eyes  Reviewed  Mouth  Reviewed  Skin  Reviewed  Nails  Reviewed     Diet Order:   Diet Order            Diet Heart Room service appropriate? Yes; Fluid consistency: Thin  Diet effective now             EDUCATION NEEDS:   Education needs have been addressed  Skin:  Skin Assessment: Reviewed RN Assessment  Last BM:  5/14  Height:   Ht Readings from Last 1 Encounters:  11/18/19 5' 9.5" (1.765 m)    Weight:   Wt Readings from Last 1 Encounters:  11/19/19 88.6 kg    Ideal Body Weight:  74 kg  BMI:  Body mass index is 28.43 kg/m.  Estimated Nutritional Needs:   Kcal:  1800-2100kcal/day  Protein:  90-100g/day  Fluid:  2L/day  Koleen Distance MS, RD, LDN Please refer to [facility] for RD and/or RD on-call/weekend/after hours pager

## 2019-11-19 NOTE — Progress Notes (Signed)
[place] at [facility]   PATIENT NAME: Carl [name]    MR#:  [id number]  DATE OF BIRTH:  05/11/1961  SUBJECTIVE:  CHIEF COMPLAINT:  No chief complaint on file. SOB improving, abd distension, sitting in chair, 3.2 liters neg REVIEW OF SYSTEMS:  Review of Systems  Constitutional: Negative for diaphoresis, fever, malaise/fatigue and weight loss.  HENT: Negative for ear discharge, ear pain, hearing loss, nosebleeds, sore throat and tinnitus.   Eyes: Negative for blurred vision and pain.  Respiratory: Positive for shortness of breath. Negative for cough, hemoptysis and wheezing.   Cardiovascular: Negative for chest pain, palpitations, orthopnea and leg swelling.  Gastrointestinal: Negative for abdominal pain, blood in stool, constipation, diarrhea, heartburn, nausea and vomiting.  Genitourinary: Negative for dysuria, frequency and urgency.  Musculoskeletal: Negative for back pain and myalgias.  Skin: Negative for itching and rash.  Neurological: Negative for dizziness, tingling, tremors, focal weakness, seizures, weakness and headaches.  Psychiatric/Behavioral: Negative for depression. The patient is not nervous/anxious.    DRUG ALLERGIES:   Allergies  Allergen Reactions  . Iodinated Diagnostic Agents Shortness Of Breath  . Penicillins Anaphylaxis and Shortness Of Breath    Respiratory  Tolerated cefuroxime on 01/06/16  . Strawberry Extract Anaphylaxis  . Cefepime Itching    Empiric antibiotic, developed pruritis.   . Eggs Or Egg-Derived Products Itching    Medicine related, pt tolerates ingestion of eggs  . Erythromycin Itching  . Sulfa Antibiotics Itching, Nausea And Vomiting and Nausea Only   VITALS:  Blood pressure 113/61, pulse 62, temperature 97.8 F (36.6 C), resp. rate 16, height 5' 9.5" (1.765 m), weight 88.6 kg, SpO2 96 %. PHYSICAL EXAMINATION:  Physical Exam HENT:     Head: Normocephalic and atraumatic.  Eyes:     Conjunctiva/sclera: Conjunctivae  normal.     Pupils: Pupils are equal, round, and reactive to light.  Neck:     Thyroid: No thyromegaly.     Trachea: No tracheal deviation.  Cardiovascular:     Rate and Rhythm: Normal rate and regular rhythm.     Heart sounds: Normal heart sounds.  Pulmonary:     Effort: Pulmonary effort is normal. No respiratory distress.     Breath sounds: Normal breath sounds. No wheezing.  Chest:     Chest wall: No tenderness.  Abdominal:     General: Bowel sounds are normal. There is distension.     Palpations: Abdomen is soft.     Tenderness: There is no abdominal tenderness.  Musculoskeletal:        General: Normal range of motion.     Cervical back: Normal range of motion and neck supple.  Skin:    General: Skin is warm and dry.     Findings: No rash.  Neurological:     Mental Status: He is alert and oriented to person, place, and time.     Cranial Nerves: No cranial nerve deficit.    LABORATORY PANEL:  Male CBC Recent Labs  Lab 11/18/19 0249  WBC 8.3  HGB 12.6*  HCT 39.8  PLT 218   ------------------------------------------------------------------------------------------------------------------ Chemistries  Recent Labs  Lab 11/18/19 0249 11/18/19 0249 11/19/19 0519  NA 140   < > 134*  K 4.5   < > 4.8  CL 109   < > 96*  CO2 18*   < > 26  GLUCOSE 187*   < > 242*  BUN 13   < > 31*  CREATININE 1.28*   < > 1.30*  CALCIUM 8.7*   < > 9.3  MG 1.9  --   --   AST 35  --   --   ALT 23  --   --   ALKPHOS 62  --   --   BILITOT 0.7  --   --    < > = values in this interval not displayed.   RADIOLOGY:  No results found. ASSESSMENT AND PLAN:  Carl [name] is a 59 year old male with PMH significant for CAD, MI s/p CABG, NSTEMI, chronic combined systolic and diastolic CHF, SVT, s/p PPM/ACID, COPD, HTN, HLD, CVA, thyroid disease, bipolar 1 disorder and tobacco use admitted for acute respiratory distress likely secondary to COPD and CHF exacerbation.   Acute respiratory failure  with hypoxia (HCC) -Patient presented with sudden onset shortness of breath with O2 sat of 40% on room air and extreme work of breathing, initially needed BiPAP and now weaned off to RA -Likely due to COPD/CHF exacerbation -Covid negative  COPD exacerbation ([place]) -Scheduled and as needed bronchodilator treatments -IV steroids and IV antibiotics  Acute on chronic systolic CHF (congestive heart failure) (HCC) AICD -BNP elevated at 1292 but chest x-ray shows no acute airspace disease -Last recorded EF 25 to 30% -Echocardiogram pending, Neg 3.2 liters fluid balance -Low-dose IV Lasix 20 mg twice daily, continue home carvedilol and lisinopril -Strict I's and O's, low-sodium diet, daily weights  ReDS Vest / Clip - 11/19/19 1240      ReDS Vest / Clip   Station Marker  D    Ruler Value  34    ReDS Value Range  Low volume    ReDS Actual Value  33       Elevated troponin, due to demand ischemia Coronary artery diseasewith history of CABG -Troponin elevated at 49->106->73->67, due to demand ischemia from increased work of breathing -Patient denies chest pain and EKG with no acute ST-T wave changes though with old left bundle branch block -Continue home carvedilol, clopidogrel   Acute kidney injury -Creatinine 1.30 above baseline of 1.04 -Continue to monitor renal function in view of Lasix therapy  Essential hypertension -Continue carvedilol - hold lisinopril due to AKI  History of stroke -Continue clopidogrel. Not currently on statin   Hypothyroidism -Continue levothyroxine  Ascites - abd [place], if significant collection, may do ascites tap   Consults called: Cardiology   Status is: Inpatient  Remains inpatient appropriate because:Ongoing diagnostic testing needed not appropriate for outpatient work up   Dispo: The patient is from: Home              Anticipated d/c is to: Home              Anticipated d/c date is: 3 days               Patient currently is not medically stable to d/c.    DVT prophylaxis: Lovenox Family Communication: Discussed with patient   All the records are reviewed and case discussed with Care Management/Social Worker. Management plans discussed with the patient, nursing and they are in agreement.  CODE STATUS: Full Code  TOTAL TIME TAKING CARE OF THIS PATIENT: 35 minutes.   More than 50% of the time was spent in counseling/coordination of care: YES  POSSIBLE D/C IN 3-4 DAYS, DEPENDING ON CLINICAL CONDITION.   Max Sane M.D on 11/19/2019 at 6:44 PM  [facility]   CC: Primary care physician; Physicians, [facility]  Note: This dictation was prepared with Dragon dictation along with  smaller phrase technology. Any transcriptional errors that result from this process are unintentional. 

## 2019-11-19 NOTE — Progress Notes (Signed)
[facility]  Patient Description:Carl [name] is a 59 year old male with PMH significant for CAD, MI s/p CABG, NSTEMI, chronic combined systolic and diastolic CHF, SVT, s/p PPM/ACID, COPD, HTN, HLD, CVA, thyroid disease, bipolar 1 disorder and tobacco use who was admitted for acute respiratory distress likely secondary to COPD and CHF exacerbation.   SUBJECTIVE: The patient reports to be feeling better on today, but continues to c/o dyspnea and wheezing. The patient denies any cardiac symptoms including chest pain, palpitations or peripheral edema at this time. He does express concerns over his future discharge back to the facility.   OBJECTIVE: The patient appears to be doing well, is in NAD and is now off of BIPAP but on 2L of O2 via [place]. The patient continues to be in NSR and Echocardiogram is pending. VSS.  Laboratory work on today reveals hyponatremia, hypochloridemia, hyperglycemia and elevated BUN=31 as well as creatinine=1.30. His abdomen continues to be distended and taut.   Vitals:   11/19/19 0515 11/19/19 0721 11/19/19 0740 11/19/19 1128  BP: 123/60 122/68  116/60  Pulse: 74 79  68  Resp:  17  18  Temp: 98 F (36.7 C) 98 F (36.7 C)  98.1 F (36.7 C)  TempSrc: Oral Oral    SpO2: 98% 97% 93% 98%  Weight: 88.6 kg     Height:         Intake/Output Summary (Last 24 hours) at 11/19/2019 1312 Last data filed at 11/19/2019 1238 Gross per 24 hour  Intake --  Output 2275 ml  Net -2275 ml      PHYSICAL EXAM General: Well developed, well nourished, in no acute distress HEENT:  Normocephalic and atraumatic Neck:   No JVD. Carotid +2. Supple.  Lungs: expiratory wheezes bilaterally to auscultation. Increased effort of breathing. Chest expansion symmetrical. Negative for rales, rhonchi or crackles.  Heart: HRRR . Normal S1 and S2 without gallops or murmurs.  Abdomen: Bowel sounds are positive, abdomen distended and taut and non-tender  Msk:  Back normal. Normal strength and tone for  age. Extremities: No clubbing, cyanosis or edema.   Neuro: Alert and oriented X 4. Dysarthria noted.  Psych:  Good affect, responds appropriately   LABS: Basic Metabolic Panel: Recent Labs    11/18/19 0249 11/19/19 0519  NA 140 134*  K 4.5 4.8  CL 109 96*  CO2 18* 26  GLUCOSE 187* 242*  BUN 13 31*  CREATININE 1.28* 1.30*  CALCIUM 8.7* 9.3  MG 1.9  --    Liver Function Tests: Recent Labs    11/18/19 0249  AST 35  ALT 23  ALKPHOS 62  BILITOT 0.7  PROT 6.6  ALBUMIN 3.4*   No results for input(s): LIPASE, AMYLASE in the last 72 hours. CBC: Recent Labs    11/18/19 0249  WBC 8.3  NEUTROABS 4.6  HGB 12.6*  HCT 39.8  MCV 93.4  PLT 218   Cardiac Enzymes: No results for input(s): CKTOTAL, CKMB, CKMBINDEX, TROPONINI in the last 72 hours. BNP: Invalid input(s): POCBNP D-Dimer: No results for input(s): DDIMER in the last 72 hours. Hemoglobin A1C: No results for input(s): HGBA1C in the last 72 hours. Fasting Lipid Panel: No results for input(s): CHOL, HDL, LDLCALC, TRIG, CHOLHDL, LDLDIRECT in the last 72 hours. Thyroid Function Tests: No results for input(s): TSH, T4TOTAL, T3FREE, THYROIDAB in the last 72 hours.  Invalid input(s): FREET3 Anemia Panel: No results for input(s): VITAMINB12, FOLATE, FERRITIN, TIBC, IRON, RETICCTPCT in the last 72 hours.  DG Chest [facility]  1 View  Result Date: 11/18/2019 CLINICAL DATA:  Dyspnea EXAM: PORTABLE CHEST 1 VIEW COMPARISON:  05/19/2019 FINDINGS: Unchanged appearance of left chest wall AICD, median sternotomy wires and left clavicular hardware. There is moderate cardiomegaly, which is unchanged. No pulmonary edema or focal airspace consolidation. No pleural effusion or pneumothorax. IMPRESSION: Unchanged cardiomegaly without pulmonary edema or focal airspace disease. Electronically Signed   By: Deatra Robinson M.D.   On: 11/18/2019 03:06     Echo: pending   TELEMETRY: NSR without any ST or T wave changes   ASSESSMENT AND  PLAN:  Principal Problem:   Acute respiratory failure with hypoxia (HCC) Active Problems:   COPD exacerbation (HCC)   Coronary artery disease   Essential hypertension   History of stroke   Hypothyroidism   Acute on chronic systolic CHF (congestive heart failure) (HCC)   AICD (automatic cardioverter/defibrillator) present   Elevated troponin   Hypoxia   AKI (acute kidney injury) (HCC)  Electrolyte imbalance     Acute on chronic combined systolic and diastolic CHF, fairly stable             -We will continue IV diuresis with Lasix 20mg  twice daily.             -Echocardiogram pending             -Continue lisinopril and coreg. Dependent upon Echo results, we may switch patient to entresto due to reduced EF.             -Strict I&O's, low sodium diet, daily weights (weight at 88.6kg down from initial 91.6kg).   Elevated Troponin, (initial=49>>106>>73>>67), likely demand ischemia             -We will continue conservative management at this time.  -Recommend outpatient stress test.   Acute Respiratory Failure with hypoxia, likely due to COPD exacerbation versus CHF exacerbation, fairly stable             -Agree with current plan with nebulizers, steroids and supplement oxygen as needed.              -Covid-19 test negative.   CAD s/p CABG, stable             -Continue management with Plavix and aspirin.              - continue crestor 40mg .   AKI, progressive worsening, BUN=31 (previous 13), creatinine=1.30 (previous 1.28)             -Recommend trending CMP. Consider Nephrology consult if kidney functioning continues to worsen.              -Continue Gentle diuresis as ordered.   HTN in the presence of episodic hypotension, reasonably stable             -Continue management with lisinopril and coreg.              -VS every 4 hours.   H/o CVA, stable, with residual dysarthria, patient is not on anticoagulant             -We will continue crestor.   Hypothyroidism,  stable             -Recommend continuing levothyroxine.    Electrolyte imbalance  -Recommend electrolyte replacement.    Jaythen Hamme, ACNPC-AG  11/19/2019 1:12 PM

## 2019-11-20 LAB — CBC
HCT: 35.1 % — ABNORMAL LOW (ref 39.0–52.0)
Hemoglobin: 12.2 g/dL — ABNORMAL LOW (ref 13.0–17.0)
MCH: 30 pg (ref 26.0–34.0)
MCHC: 34.8 g/dL (ref 30.0–36.0)
MCV: 86.2 fL (ref 80.0–100.0)
Platelets: 203 10*3/uL (ref 150–400)
RBC: 4.07 MIL/uL — ABNORMAL LOW (ref 4.22–5.81)
RDW: 15.3 % (ref 11.5–15.5)
WBC: 15.2 10*3/uL — ABNORMAL HIGH (ref 4.0–10.5)
nRBC: 0 % (ref 0.0–0.2)

## 2019-11-20 LAB — COMPREHENSIVE METABOLIC PANEL
ALT: 23 U/L (ref 0–44)
AST: 21 U/L (ref 15–41)
Albumin: 3.4 g/dL — ABNORMAL LOW (ref 3.5–5.0)
Alkaline Phosphatase: 48 U/L (ref 38–126)
Anion gap: 9 (ref 5–15)
BUN: 49 mg/dL — ABNORMAL HIGH (ref 8–23)
CO2: 28 mmol/L (ref 22–32)
Calcium: 9.5 mg/dL (ref 8.9–10.3)
Chloride: 99 mmol/L (ref 98–111)
Creatinine, Ser: 1.02 mg/dL (ref 0.61–1.24)
GFR calc Af Amer: >60 mL/min (ref >60)
GFR calc non Af Amer: >60 mL/min (ref >60)
Glucose, Bld: 121 mg/dL — ABNORMAL HIGH (ref 70–99)
Potassium: 4.2 mmol/L (ref 3.5–5.1)
Sodium: 136 mmol/L (ref 135–145)
Total Bilirubin: 0.6 mg/dL (ref 0.3–1.2)
Total Protein: 6.6 g/dL (ref 6.5–8.1)

## 2019-11-20 NOTE — Evaluation (Signed)
Clinical/Bedside Swallow Evaluation Patient Details  Name: Carl [name] MRN: [id number] Date of Birth: Sep 23, 1960  Today's Date: 11/20/2019 Time: SLP Start Time (ACUTE ONLY): 0915 SLP Stop Time (ACUTE ONLY): 0955 SLP Time Calculation (min) (ACUTE ONLY): 40 min  Past Medical History:  Past Medical History:  Diagnosis Date  . Acute pulmonary edema (HCC) 2017  . Acute respiratory failure with hypoxia (HCC) 2017  . Bipolar 1 disorder (HCC)   . COPD (chronic obstructive pulmonary disease) (HCC)   . Depression   . Hypertension   . Myocardial infarction (HCC)   . NSTEMI (non-ST elevated myocardial infarction) (HCC)   . 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 Seizures (HCC)    childhood  . Stroke (HCC)   . Tobacco abuse    Past Surgical History: History reviewed. No pertinent surgical history. HPI:  Pt is a 59 y.o. male with medical history significant for CAD with history of CABG, systolic heart failure status post AICD, COPD, hypertension, bipolar mood disorder and history of CVA and nicotine dependence who presents to the emergency room after he was awoken suddenly from his sleep with severe shortness of breath.  He said it felt like the last time he had heart failure.  He denied chest pain.  He denied pain or swelling in the legs.  Has had no cough fever or chills...  On arrival of EMS he was in severe respiratory distress with O2 sat was 40% on room air improving to 94% after they applied CPAP.  CXR: "cardiomegaly without pulmonary edema or focal airspace disease".    Assessment / Plan / Recommendation Clinical Impression  Pt appears to present w/ adequate pharyngeal phase swallow function; grossly adequate oral phase swallow function -- pt is missing most Dentition which impacts mastication effort and efficiency. This is Baseline for pt; he states he does "fine" and "chews the foods" well. Recommend general aspiration precautions; a more mech soft diet  consistency w/ gravies added to cut meats to moisten. Reccommend monitoring of status while admitted, need for tray setup at meals and/or any difficulty swallowing Pills w/ NSG. ST services can be reconsult if any new needs while admitted.  SLP Visit Diagnosis: Dysphagia, unspecified (R13.10)    Aspiration Risk  (reduced following precs.)    Diet Recommendation  Mech Soft foods w/ meats cut small, moistened foods; Thin liquids. General aspiration precautions.  Medication Administration: Whole meds with liquid(or in Puree if needed)    Other  Recommendations Recommended Consults: (Dietician f/u as needed) Oral Care Recommendations: Oral care BID;Oral care before and after PO;Patient independent with oral care Other Recommendations: (n/a)   Follow up Recommendations None      Frequency and Duration (n/a)  (n/a)       Prognosis Prognosis for Safe Diet Advancement: Good Barriers to Reach Goals: Cognitive deficits;Severity of deficits;Behavior(baseline)      Swallow Study   General Date of Onset: 11/18/19 HPI: Pt is a 59 y.o. male with medical history significant for CAD with history of CABG, systolic heart failure status post AICD, COPD, hypertension, bipolar mood disorder and history of CVA and nicotine dependence who presents to the emergency room after he was awoken suddenly from his sleep with severe shortness of breath.  He said it felt like the last time he had heart failure.  He denied chest pain.  He denied pain or swelling in the legs.  Has had no cough fever or chills...  On arrival of EMS he was in  severe respiratory distress with O2 sat was 40% on room air improving to 94% after they applied CPAP.  CXR: "cardiomegaly without pulmonary edema or focal airspace disease".  Type of Study: Bedside Swallow Evaluation Previous Swallow Assessment: none Diet Prior to this Study: Regular;Thin liquids Temperature Spikes Noted: No Respiratory Status: Nasal cannula(2L) History of Recent  Intubation: No Behavior/Cognition: Alert;Cooperative;Pleasant mood;Distractible;Requires cueing Oral Cavity Assessment: Within Functional Limits Oral Care Completed by SLP: Recent completion by staff Oral Cavity - Dentition: Poor condition;Missing dentition(most teeth missing) Vision: Functional for self-feeding Self-Feeding Abilities: Able to feed self;Needs set up Patient Positioning: Upright in bed(already sitting fully upright) Baseline Vocal Quality: Normal Volitional Cough: Strong Volitional Swallow: Able to elicit    Oral/Motor/Sensory Function Overall Oral Motor/Sensory Function: Within functional limits   Ice Chips Ice chips: Not tested   Thin Liquid Thin Liquid: Within functional limits Presentation: Cup;Self Fed(bottle, several sips/ozs)    Nectar Thick Nectar Thick Liquid: Not tested   Honey Thick Honey Thick Liquid: Not tested   Puree Puree: Within functional limits Presentation: Spoon;Self Fed(5 trials)   Solid     Solid: Not tested       Orinda Kenner, MS, CCC-SLP Watson,Katherine 11/20/2019,12:11 PM

## 2019-11-20 NOTE — Progress Notes (Addendum)
[place] [facility] [place] NAME: Carl [name]    MR#:  [id number]  DATE OF BIRTH:  1960-07-25  SUBJECTIVE:  CHIEF COMPLAINT:  No chief complaint on file. SOB improving, abd distension, sitting in chair, 6 liters neg REVIEW OF SYSTEMS:  Review of Systems  Constitutional: Negative for diaphoresis, fever, malaise/fatigue and weight loss.  HENT: Negative for ear discharge, ear pain, hearing loss, nosebleeds, sore throat and tinnitus.   Eyes: Negative for blurred vision and pain.  Respiratory: Positive for shortness of breath. Negative for cough, hemoptysis and wheezing.   Cardiovascular: Negative for chest pain, palpitations, orthopnea and leg swelling.  Gastrointestinal: Negative for abdominal pain, blood in stool, constipation, diarrhea, heartburn, nausea and vomiting.  Genitourinary: Negative for dysuria, frequency and urgency.  Musculoskeletal: Negative for back pain and myalgias.  Skin: Negative for itching and rash.  Neurological: Negative for dizziness, tingling, tremors, focal weakness, seizures, weakness and headaches.  Psychiatric/Behavioral: Negative for depression. The patient is not nervous/anxious.    DRUG ALLERGIES:   Allergies  Allergen Reactions  . Iodinated Diagnostic Agents Shortness Of Breath  . Penicillins Anaphylaxis and Shortness Of Breath    Respiratory  Tolerated cefuroxime on 01/06/16  . Strawberry Extract Anaphylaxis  . Cefepime Itching    Empiric antibiotic, developed pruritis.   . Erythromycin Itching  . Sulfa Antibiotics Itching, Nausea And Vomiting and Nausea Only   VITALS:  Blood pressure (!) 141/97, pulse 80, temperature 97.7 F (36.5 C), temperature source Oral, resp. rate 17, height 5' 9.5" (1.765 m), weight 87.7 kg, SpO2 98 %. PHYSICAL EXAMINATION:  Physical Exam HENT:     Head: Normocephalic and atraumatic.  Eyes:     Conjunctiva/sclera: Conjunctivae normal.     Pupils: Pupils are equal, round, and reactive to light.    Neck:     Thyroid: No thyromegaly.     Trachea: No tracheal deviation.  Cardiovascular:     Rate and Rhythm: Normal rate and regular rhythm.     Heart sounds: Normal heart sounds.  Pulmonary:     Effort: Pulmonary effort is normal. No respiratory distress.     Breath sounds: Normal breath sounds. No wheezing.  Chest:     Chest wall: No tenderness.  Abdominal:     General: Bowel sounds are normal. There is distension.     Palpations: Abdomen is soft.     Tenderness: There is no abdominal tenderness.  Musculoskeletal:        General: Normal range of motion.     Cervical back: Normal range of motion and neck supple.  Skin:    General: Skin is warm and dry.     Findings: No rash.  Neurological:     Mental Status: He is alert and oriented to person, place, and time.     Cranial Nerves: No cranial nerve deficit.    LABORATORY PANEL:  Male CBC Recent Labs  Lab 11/20/19 0552  WBC 15.2*  HGB 12.2*  HCT 35.1*  PLT 203   ------------------------------------------------------------------------------------------------------------------ Chemistries  Recent Labs  Lab 11/18/19 0249 11/19/19 0519 11/20/19 0552  NA 140   < > 136  K 4.5   < > 4.2  CL 109   < > 99  CO2 18*   < > 28  GLUCOSE 187*   < > 121*  BUN 13   < > 49*  CREATININE 1.28*   < > 1.02  CALCIUM 8.7*   < > 9.5  MG 1.9  --   --  AST 35  --  21  ALT 23  --  23  ALKPHOS 62  --  48  BILITOT 0.7  --  0.6   < > = values in this interval not displayed.   RADIOLOGY:  US Abdomen Limited  Result Date: 11/19/2019 CLINICAL DATA:  Ascites. EXAM: LIMITED ABDOMEN ULTRASOUND FOR ASCITES TECHNIQUE: Limited ultrasound survey for ascites was performed in all four abdominal quadrants. COMPARISON:  None. FINDINGS: No significant or drainable ascites in all 4 quadrants of the abdomen. IMPRESSION: No significant or drainable ascites in all 4 quadrants of the abdomen. Electronically Signed   By: Narda Rutherford M.D.   On:  11/19/2019 23:19   ASSESSMENT AND PLAN:  Mr. Dollens is a 59 year old male with PMH significant for CAD, MI s/p CABG, NSTEMI, chronic combined systolic and diastolic CHF, SVT, s/p PPM/ACID, COPD, HTN, HLD, CVA, thyroid disease, bipolar 1 disorder and tobacco use admitted for acute respiratory distress likely secondary to COPD and CHF exacerbation.   Acute respiratory failure with hypoxia (HCC) -Patient presented with sudden onset shortness of breath with O2 sat of 40% on room air and extreme work of breathing, initially needed BiPAP and now weaned off to RA -Likely due to COPD/CHF exacerbation -Covid negative  COPD exacerbation (HCC) -Scheduled and as needed bronchodilator treatments -IV steroids and IV antibiotics  Acute on chronic systolic CHF (congestive heart failure) (HCC) AICD -BNP elevated at 1292 but chest x-ray shows no acute airspace disease -Last recorded EF 25 to 30% -Echocardiogram pending, Neg 6 liters fluid balance -Low-dose IV Lasix 20 mg twice daily, continue home carvedilol and lisinopril -Strict I's and O's, low-sodium diet, daily weights  ReDS Vest / Clip - 11/19/19 1240      ReDS Vest / Clip   Station Marker  D    Ruler Value  34    ReDS Value Range  Low volume    ReDS Actual Value  33       Elevated troponin, due to demand ischemia Coronary artery diseasewith history of CABG -Troponin elevated at 49->106->73->67, due to demand ischemia from increased work of breathing -Patient denies chest pain and EKG with no acute ST-T wave changes though with old left bundle branch block -Continue home carvedilol, clopidogrel   Acute kidney injury -Creatinine 1.02 -monitor while on Lasix  Essential hypertension -Continue carvedilol - hold lisinopril due to AKI  History of stroke -Continue clopidogrel. Not currently on statin   Hypothyroidism -Continue levothyroxine  Abdominal distention Ultrasound not showing significant amount of  fluid for ascites tap   Consults called: Cardiology   Status is: Inpatient  Remains inpatient appropriate because:Ongoing diagnostic testing needed not appropriate for outpatient work up   Dispo: The patient is from: Home              Anticipated d/c is to: Home              Anticipated d/c date is: 2 days              Patient currently is not medically stable to d/c.    DVT prophylaxis: Lovenox Family Communication: Discussed with patient   All the records are reviewed and case discussed with Care Management/Social Worker. Management plans discussed with the patient, nursing and they are in agreement.  CODE STATUS: Full Code  TOTAL TIME TAKING CARE OF THIS PATIENT: 35 minutes.   More than 50% of the time was spent in counseling/coordination of care: YES  POSSIBLE D/C IN 2-3  DAYS, DEPENDING ON CLINICAL CONDITION.   Delfino Lovett M.D on 11/20/2019 at 1:57 PM  [facility]   CC: Primary care physician; Physicians, [facility]  Note: This dictation was prepared with Dragon dictation along with smaller phrase technology. Any transcriptional errors that result from this process are unintentional.

## 2019-11-21 LAB — BASIC METABOLIC PANEL
Anion gap: 10 (ref 5–15)
BUN: 53 mg/dL — ABNORMAL HIGH (ref 8–23)
CO2: 29 mmol/L (ref 22–32)
Calcium: 9.6 mg/dL (ref 8.9–10.3)
Chloride: 97 mmol/L — ABNORMAL LOW (ref 98–111)
Creatinine, Ser: 1.27 mg/dL — ABNORMAL HIGH (ref 0.61–1.24)
GFR calc Af Amer: >60 mL/min (ref >60)
GFR calc non Af Amer: >60 mL/min (ref >60)
Glucose, Bld: 118 mg/dL — ABNORMAL HIGH (ref 70–99)
Potassium: 4.4 mmol/L (ref 3.5–5.1)
Sodium: 136 mmol/L (ref 135–145)

## 2019-11-21 MED ORDER — NICOTINE 21 MG/24HR TD PT24
21.0000 mg | MEDICATED_PATCH | Freq: Every day | TRANSDERMAL | Status: DC
  Administered 2019-11-21 – 2019-11-22 (×2): 21 mg via TRANSDERMAL
  Filled 2019-11-21 (×2): qty 1

## 2019-11-21 MED ORDER — CALCIUM CARBONATE ANTACID 500 MG PO CHEW: 1.0000 | CHEWABLE_TABLET | Freq: Once | ORAL | Status: DC

## 2019-11-21 MED ORDER — ACETAMINOPHEN 325 MG PO TABS
650.0000 mg | ORAL_TABLET | Freq: Four times a day (QID) | ORAL | Status: DC | PRN
  Administered 2019-11-21: 650 mg via ORAL
  Filled 2019-11-21: qty 2

## 2019-11-21 MED ORDER — IPRATROPIUM-ALBUTEROL 0.5-2.5 (3) MG/3ML IN SOLN
3.0000 mL | Freq: Three times a day (TID) | RESPIRATORY_TRACT | Status: DC
  Administered 2019-11-21 – 2019-11-22 (×4): 3 mL via RESPIRATORY_TRACT
  Filled 2019-11-21 (×4): qty 3

## 2019-11-21 NOTE — Progress Notes (Signed)
[place] at [facility]   PATIENT NAME: Carl [name]    MR#:  [id number]  DATE OF BIRTH:  06-May-1961  SUBJECTIVE:  CHIEF COMPLAINT:  No chief complaint on file. SOB improving, requesting nicotine patch, sitting in chair, 7.8 liters neg REVIEW OF SYSTEMS:  Review of Systems  Constitutional: Negative for diaphoresis, fever, malaise/fatigue and weight loss.  HENT: Negative for ear discharge, ear pain, hearing loss, nosebleeds, sore throat and tinnitus.   Eyes: Negative for blurred vision and pain.  Respiratory: Positive for shortness of breath. Negative for cough, hemoptysis and wheezing.   Cardiovascular: Negative for chest pain, palpitations, orthopnea and leg swelling.  Gastrointestinal: Negative for abdominal pain, blood in stool, constipation, diarrhea, heartburn, nausea and vomiting.  Genitourinary: Negative for dysuria, frequency and urgency.  Musculoskeletal: Negative for back pain and myalgias.  Skin: Negative for itching and rash.  Neurological: Negative for dizziness, tingling, tremors, focal weakness, seizures, weakness and headaches.  Psychiatric/Behavioral: Negative for depression. The patient is not nervous/anxious.    DRUG ALLERGIES:   Allergies  Allergen Reactions  . Iodinated Diagnostic Agents Shortness Of Breath  . Penicillins Anaphylaxis and Shortness Of Breath    Respiratory  Tolerated cefuroxime on 01/06/16  . Strawberry Extract Anaphylaxis  . Cefepime Itching    Empiric antibiotic, developed pruritis.   . Erythromycin Itching  . Sulfa Antibiotics Itching, Nausea And Vomiting and Nausea Only   VITALS:  Blood pressure 121/66, pulse 72, temperature 98.2 F (36.8 C), resp. rate 15, height 5' 9.5" (1.765 m), weight 86.9 kg, SpO2 98 %. PHYSICAL EXAMINATION:  Physical Exam HENT:     Head: Normocephalic and atraumatic.  Eyes:     Conjunctiva/sclera: Conjunctivae normal.     Pupils: Pupils are equal, round, and reactive to light.  Neck:   Thyroid: No thyromegaly.     Trachea: No tracheal deviation.  Cardiovascular:     Rate and Rhythm: Normal rate and regular rhythm.     Heart sounds: Normal heart sounds.  Pulmonary:     Effort: Pulmonary effort is normal. No respiratory distress.     Breath sounds: Normal breath sounds. No wheezing.  Chest:     Chest wall: No tenderness.  Abdominal:     General: Bowel sounds are normal. There is distension.     Palpations: Abdomen is soft.     Tenderness: There is no abdominal tenderness.  Musculoskeletal:        General: Normal range of motion.     Cervical back: Normal range of motion and neck supple.  Skin:    General: Skin is warm and dry.     Findings: No rash.  Neurological:     Mental Status: He is alert and oriented to person, place, and time.     Cranial Nerves: No cranial nerve deficit.    LABORATORY PANEL:  Male CBC Recent Labs  Lab 11/20/19 0552  WBC 15.2*  HGB 12.2*  HCT 35.1*  PLT 203   ------------------------------------------------------------------------------------------------------------------ Chemistries  Recent Labs  Lab 11/18/19 0249 11/19/19 0519 11/20/19 0552 11/20/19 0552 11/21/19 0445  NA 140   < > 136   < > 136  K 4.5   < > 4.2   < > 4.4  CL 109   < > 99   < > 97*  CO2 18*   < > 28   < > 29  GLUCOSE 187*   < > 121*   < > 118*  BUN 13   < >  49*   < > 53*  CREATININE 1.28*   < > 1.02   < > 1.27*  CALCIUM 8.7*   < > 9.5   < > 9.6  MG 1.9  --   --   --   --   AST 35  --  21  --   --   ALT 23  --  23  --   --   ALKPHOS 62  --  48  --   --   BILITOT 0.7  --  0.6  --   --    < > = values in this interval not displayed.   RADIOLOGY:  No results found. ASSESSMENT AND PLAN:  Mr. Caples is a 59 year old male with PMH significant for CAD, MI s/p CABG, NSTEMI, chronic combined systolic and diastolic CHF, SVT, s/p PPM/ACID, COPD, HTN, HLD, CVA, thyroid disease, bipolar 1 disorder and tobacco use admitted for acute respiratory distress  likely secondary to COPD and CHF exacerbation.   Acute respiratory failure with hypoxia (HCC) -Patient presented with sudden onset shortness of breath with O2 sat of 40% on room air and extreme work of breathing, initially needed BiPAP and now weaned off to RA -Likely due to COPD/CHF exacerbation -Covid negative  COPD exacerbation (HCC) -Scheduled and as needed bronchodilator treatments -IV steroids and IV antibiotics -We will add nicotine patch per patient request  Acute on chronic systolic CHF (congestive heart failure) (HCC) AICD -BNP elevated at 1292 but chest x-ray shows no acute airspace disease -Last recorded EF 25 to 30% -Echocardiogram pending, Neg 7.8 liters fluid balance -Low-dose IV Lasix 20 mg twice daily, continue home carvedilol and lisinopril -Strict I's and O's, low-sodium diet, daily weights  ReDS Vest / Clip - 11/19/19 1240      ReDS Vest / Clip   Station Marker  D    Ruler Value  34    ReDS Value Range  Low volume    ReDS Actual Value  33      Elevated troponin, due to demand ischemia Coronary artery diseasewith history of CABG -Troponin elevated at 49->106->73->67, due to demand ischemia from increased work of breathing -Patient denies chest pain and EKG with no acute ST-T wave changes though with old left bundle branch block -Continue home carvedilol, clopidogrel   Acute kidney injury -Creatinine 1.02->1.27 -monitor while on Lasix  Essential hypertension -Continue carvedilol - hold lisinopril due to AKI  History of stroke -Continue clopidogrel. Not currently on statin   Hypothyroidism -Continue levothyroxine  Abdominal distention Ultrasound not showing significant amount of fluid for ascites tap   Consults called: Cardiology. No follow-up on 5/15 and 5/16   Status is: Inpatient  Remains inpatient appropriate because:Ongoing diagnostic testing needed not appropriate for outpatient work up   Dispo: The patient  is from: Group [facility]              Anticipated d/c is to: Group home              Anticipated d/c date is: 2 days              Patient currently is not medically stable to d/c.    DVT prophylaxis: Lovenox Family Communication: Discussed with patient   All the records are reviewed and case discussed with Care Management/Social Worker. Management plans discussed with the patient, nursing and they are in agreement.  CODE STATUS: Full Code  TOTAL TIME TAKING CARE OF THIS PATIENT: 35 minutes.   More  than 50% of the time was spent in counseling/coordination of care: YES  POSSIBLE D/C IN 1-2 DAYS, DEPENDING ON CLINICAL CONDITION.   Max Sane M.D on 11/21/2019 at 1:43 PM  [facility]   CC: Primary care physician; [facility], [facility]  Note: This dictation was prepared with Dragon dictation along with smaller phrase technology. Any transcriptional errors that result from this process are unintentional.

## 2019-11-21 NOTE — Progress Notes (Signed)
While administering meds this AM, patient stated he had thoughts of taking an exacto knife to his neck and had thoughts of "murdering his sister". Patient stated he feels better when he is able to talk to someone about it. He does not appear to show any signs of agitation or aggression. MD made aware. Sharps container taken out of pts rm for safety.

## 2019-11-21 NOTE — Plan of Care (Signed)
  Problem: Clinical Measurements: Goal: Respiratory complications will improve Outcome: Progressing   Problem: Activity: Goal: Risk for activity intolerance will decrease Outcome: Progressing   Problem: Activity: Goal: Capacity to carry out activities will improve Outcome: Progressing   

## 2019-11-22 DIAGNOSIS — J441 Chronic obstructive pulmonary disease with (acute) exacerbation: Principal | ICD-10-CM

## 2019-11-22 DIAGNOSIS — K7031 Alcoholic cirrhosis of liver with ascites: Secondary | ICD-10-CM

## 2019-11-22 LAB — BASIC METABOLIC PANEL
Anion gap: 12 (ref 5–15)
BUN: 63 mg/dL — ABNORMAL HIGH (ref 8–23)
CO2: 25 mmol/L (ref 22–32)
Calcium: 9.7 mg/dL (ref 8.9–10.3)
Chloride: 94 mmol/L — ABNORMAL LOW (ref 98–111)
Creatinine, Ser: 1.4 mg/dL — ABNORMAL HIGH (ref 0.61–1.24)
GFR calc Af Amer: >60 mL/min (ref >60)
GFR calc non Af Amer: 54 mL/min — ABNORMAL LOW (ref >60)
Glucose, Bld: 113 mg/dL — ABNORMAL HIGH (ref 70–99)
Potassium: 4.5 mmol/L (ref 3.5–5.1)
Sodium: 131 mmol/L — ABNORMAL LOW (ref 135–145)

## 2019-11-22 LAB — CBC
HCT: 39.9 % (ref 39.0–52.0)
Hemoglobin: 14.2 g/dL (ref 13.0–17.0)
MCH: 30 pg (ref 26.0–34.0)
MCHC: 35.6 g/dL (ref 30.0–36.0)
MCV: 84.4 fL (ref 80.0–100.0)
Platelets: 240 10*3/uL (ref 150–400)
RBC: 4.73 MIL/uL (ref 4.22–5.81)
RDW: 14.8 % (ref 11.5–15.5)
WBC: 13.3 10*3/uL — ABNORMAL HIGH (ref 4.0–10.5)
nRBC: 0 % (ref 0.0–0.2)

## 2019-11-22 MED ORDER — FUROSEMIDE 20 MG PO TABS
20.0000 mg | ORAL_TABLET | Freq: Every day | ORAL | 11 refills | Status: DC | PRN
Start: 2019-11-22 — End: 2020-01-27

## 2019-11-22 MED ORDER — NICOTINE 21 MG/24HR TD PT24: 21.0000 mg | MEDICATED_PATCH | Freq: Every day | TRANSDERMAL | 0 refills | Status: DC

## 2019-11-22 MED ORDER — SPIRIVA HANDIHALER 18 MCG IN CAPS
18.0000 ug | ORAL_CAPSULE | Freq: Every day | RESPIRATORY_TRACT | 0 refills | Status: DC
Start: 2019-11-22 — End: 2020-03-01

## 2019-11-22 MED ORDER — FLUTICASONE-SALMETEROL 250-50 MCG/DOSE IN AEPB: 1.0000 | INHALATION_SPRAY | Freq: Two times a day (BID) | RESPIRATORY_TRACT | 0 refills | Status: DC

## 2019-11-22 MED ORDER — SPIRIVA HANDIHALER 18 MCG IN CAPS
18.0000 ug | ORAL_CAPSULE | Freq: Every day | RESPIRATORY_TRACT | 0 refills | Status: DC
Start: 2019-11-22 — End: 2019-11-22

## 2019-11-22 MED ORDER — FUROSEMIDE 20 MG PO TABS
20.0000 mg | ORAL_TABLET | Freq: Every day | ORAL | 11 refills | Status: DC | PRN
Start: 2019-11-22 — End: 2019-11-22

## 2019-11-22 NOTE — Discharge Instructions (Signed)

## 2019-11-22 NOTE — Progress Notes (Signed)
Discharge instructions explained to [facility] from [facility]/ verbalized an understanding/ ivs removed/ will transport pt off unit when ride arrives

## 2019-11-22 NOTE — NC FL2 (Signed)
[place] MEDICAID FL2 LEVEL OF CARE SCREENING TOOL     IDENTIFICATION  Patient Name: Carl [name] Birthdate: Nov 22, 1960 Sex: male Admission Date (Current Location): 11/18/2019  [facility] and [place] Number:  Chiropodist and Address:  [facility], [address], [place], [place] [address]      Provider Number: [id number]  Attending Physician Name and Address:  Delfino Lovett, MD  Relative Name and Phone Number:       Current Level of Care: Hospital Recommended Level of Care: Assisted Living Facility Prior Approval Number:    Date Approved/Denied:   PASRR Number:    Discharge Plan: (ALF)    Current Diagnoses: Patient Active Problem List   Diagnosis Date Noted  . Ascites due to alcoholic cirrhosis (HCC)   . Acute on chronic systolic CHF (congestive heart failure) (HCC) 11/18/2019  . 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 Elevated troponin 11/18/2019  . Hypoxia 11/18/2019  . AKI (acute kidney injury) (HCC) 11/18/2019  . Coronary artery disease 05/15/2019  . Essential hypertension 05/15/2019  . History of stroke 05/15/2019  . Homicidal ideation 05/15/2019  . Hypothyroidism 05/15/2019  . COPD exacerbation (HCC) 03/22/2019  . Bipolar 1 disorder (HCC) 12/23/2018  . Acute respiratory failure with hypoxia (HCC) 11/03/2018    Orientation RESPIRATION BLADDER Height & Weight     Self, Place, Situation  Normal Continent Weight: 86.6 kg Height:  5' 9.5" (176.5 cm)  BEHAVIORAL SYMPTOMS/MOOD NEUROLOGICAL BOWEL NUTRITION STATUS      Continent Diet(Regular)  AMBULATORY STATUS COMMUNICATION OF NEEDS Skin   Supervision Verbally Normal                       Personal Care Assistance Level of Assistance  Dressing, Bathing Bathing Assistance: Limited assistance   Dressing Assistance: Limited assistance     Functional Limitations Info  Sight Sight Info: Adequate Hearing Info: Adequate      SPECIAL CARE  FACTORS FREQUENCY                       Contractures Contractures Info: Not present    Additional Factors Info  Code Status, Allergies Code Status Info: Full Allergies Info: iodinated diagnostic agents, strawberry extract, penicillin, sulfa, cefepime, eurythromycin           Current Medications (11/22/2019):  This is the current hospital active medication list Current Facility-Administered Medications  Medication Dose Route Frequency Provider Last Rate Last Admin  . acetaminophen (TYLENOL) tablet 650 mg  650 mg Oral Q6H PRN Jimmye Norman, NP   650 mg at 11/21/19 1958  . albuterol (PROVENTIL) (2.5 MG/3ML) 0.083% nebulizer solution 2.5 mg  2.5 mg Nebulization Q2H PRN Andris Baumann, MD   2.5 mg at 11/19/19 2346  . aspirin EC tablet 81 mg  81 mg Oral Daily White, Delicia, NP   81 mg at 11/22/19 0833  . calcium carbonate (TUMS - dosed in mg elemental calcium) chewable tablet 200 mg of elemental calcium  1 tablet Oral Once Delfino Lovett, MD      . carvedilol (COREG) tablet 6.25 mg  6.25 mg Oral BID Andris Baumann, MD   6.25 mg at 11/22/19 0834  . clopidogrel (PLAVIX) tablet 75 mg  75 mg Oral Daily Andris Baumann, MD   75 mg at 11/22/19 0834  . divalproex (DEPAKOTE) DR tablet 500 mg  500 mg Oral Q12H Delfino Lovett, MD   500 mg at 11/22/19 0834  .  doxycycline (VIBRA-TABS) tablet 100 mg  100 mg Oral Q12H Andris Baumann, MD   100 mg at 11/22/19 4562  . enoxaparin (LOVENOX) injection 40 mg  40 mg Subcutaneous Q24H Andris Baumann, MD   40 mg at 11/22/19 5638  . feeding supplement (ENSURE ENLIVE) (ENSURE ENLIVE) liquid 237 mL  237 mL Oral BID BM Sherryll Burger, Vipul, MD   237 mL at 11/22/19 0835  . furosemide (LASIX) injection 20 mg  20 mg Intravenous Q12H Andris Baumann, MD   20 mg at 11/22/19 9373  . ipratropium-albuterol (DUONEB) 0.5-2.5 (3) MG/3ML nebulizer solution 3 mL  3 mL Nebulization TID Delfino Lovett, MD   3 mL at 11/22/19 0743  . levothyroxine (SYNTHROID) tablet 25 mcg  25 mcg  Oral Daily Andris Baumann, MD   25 mcg at 11/21/19 [phone]  . lisinopril (ZESTRIL) tablet 2.5 mg  2.5 mg Oral Daily Lindajo Royal V, MD   2.5 mg at 11/22/19 6811  . multivitamin with minerals tablet 1 tablet  1 tablet Oral Daily Delfino Lovett, MD   1 tablet at 11/22/19 0834  . nicotine (NICODERM CQ - dosed in mg/24 hours) patch 21 mg  21 mg Transdermal Daily Delfino Lovett, MD   21 mg at 11/22/19 0834  . QUEtiapine (SEROQUEL) tablet 100 mg  100 mg Oral QHS Delfino Lovett, MD   100 mg at 11/21/19 2130  . rosuvastatin (CRESTOR) tablet 40 mg  40 mg Oral Q2000 Delfino Lovett, MD   40 mg at 11/21/19 1958     Discharge Medications: TAKE these medications   albuterol 108 (90 Base) MCG/ACT inhaler Commonly known as: VENTOLIN HFA Inhale 2 puffs into the lungs every 6 (six) hours as needed for wheezing.   aspirin EC 81 MG tablet Take 1 tablet (81 mg total) by mouth daily.   carvedilol 6.25 MG tablet Commonly known as: COREG Take 1 tablet (6.25 mg total) by mouth 2 (two) times daily.   clopidogrel 75 MG tablet Commonly known as: PLAVIX Take 1 tablet (75 mg total) by mouth daily.   divalproex 500 MG 24 hr tablet Commonly known as: DEPAKOTE ER Take 1 tablet (500 mg total) by mouth 2 (two) times daily.   escitalopram 10 MG tablet Commonly known as: LEXAPRO Take 1 tablet (10 mg total) by mouth at bedtime.   Fluticasone-Salmeterol 250-50 MCG/DOSE Aepb Commonly known as: Advair Diskus Inhale 1 puff into the lungs 2 (two) times daily.   furosemide 20 MG tablet Commonly known as: Lasix Take 1 tablet (20 mg total) by mouth daily as needed.   levothyroxine 25 MCG tablet Commonly known as: SYNTHROID Take 1 tablet (25 mcg total) by mouth daily.   lisinopril 2.5 MG tablet Commonly known as: ZESTRIL Take 1 tablet (2.5 mg total) by mouth daily.   nicotine 21 mg/24hr patch Commonly known as: NICODERM CQ - dosed in mg/24 hours Place 1 patch (21 mg total) onto the skin daily. Start taking on:  Nov 23, 2019   omeprazole 20 MG capsule Commonly known as: PRILOSEC Take 20 mg by mouth daily.   polyethylene glycol powder 17 GM/SCOOP powder Commonly known as: GLYCOLAX/MIRALAX Take 17 g (1 packet mixed in 4-8 oz of liquid)  by mouth daily as needed for moderate constipation.   QUEtiapine 100 MG tablet Commonly known as: SEROQUEL Take 100 mg by mouth at bedtime.   QUETIAPINE FUMARATE ER PO Take 500 mg by mouth in the morning and at bedtime.   simvastatin 40 MG  tablet Commonly known as: ZOCOR Take 40 mg by mouth at bedtime.   Spiriva HandiHaler 18 MCG inhalation capsule Generic drug: tiotropium Place 1 capsule (18 mcg total) into inhaler and inhale daily.     Relevant Imaging Results:  Relevant Lab Results:   Additional Information    Victorino Dike, RN

## 2019-11-22 NOTE — TOC Transition Note (Signed)
Transition of Care [facility]) - CM/SW Discharge Note   Patient Details  Name: Carl [name] MRN: [id number] Date of Birth: [birth year]  [facility]) CM/SW Contact:  Shawn Route, RN Phone Number: 11/22/2019, 2:27 PM   Clinical Narrative:     Patient to return to [facility] ALF facility, spoke with Administrator Leanne Chang.  She will arrange transportation.  New FL2 completed and placed in discharge packet on chart.  Bedside nurse notified.     Final next level of care: Group Home Barriers to Discharge: Barriers Resolved   Patient Goals and CMS Choice        Discharge Placement                       Discharge Plan and Services                                     Social Determinants of Health (SDOH) Interventions     Readmission Risk Interventions Readmission Risk Prevention Plan 11/03/2018  Transportation Screening Complete  PCP or Specialist Appt within 5-7 Days Not Complete  Not Complete comments still in ICU  Home Care Screening Complete  Medication Review (RN CM) Complete

## 2019-11-22 NOTE — Discharge Summary (Addendum)
5        [place] [facility]   PATIENT NAME: Carl [name]    MR#:  [id number]  DATE OF BIRTH:  1960-12-15  DATE OF ADMISSION:  11/18/2019   ADMITTING PHYSICIAN: Andris Baumann, MD  DATE OF DISCHARGE: 11/22/2019  PRIMARY CARE PHYSICIAN: [facility]   ADMISSION DIAGNOSIS:  Hypoxia [R09.02] Acute respiratory failure with hypoxia (HCC) [J96.01] DISCHARGE DIAGNOSIS:  Principal Problem:   Acute respiratory failure with hypoxia (HCC) Active Problems:   COPD exacerbation (HCC)   Coronary artery disease   Essential hypertension   History of stroke   Hypothyroidism   Acute on chronic systolic CHF (congestive heart failure) (HCC)   AICD (automatic cardioverter/defibrillator) present   Elevated troponin   Hypoxia   AKI (acute kidney injury) (HCC)   Ascites due to alcoholic cirrhosis (HCC)  SECONDARY DIAGNOSIS:   Past Medical History:  Diagnosis Date  . Acute pulmonary edema (HCC) 2017  . Acute respiratory failure with hypoxia (HCC) 2017  . Bipolar 1 disorder (HCC)   . COPD (chronic obstructive pulmonary disease) (HCC)   . Depression   . Hypertension   . Myocardial infarction (HCC)   . NSTEMI (non-ST elevated myocardial infarction) (HCC)   . 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 Seizures (HCC)    childhood  . Stroke (HCC)   . Tobacco abuse    HOSPITAL COURSE:  Carl [name] is a 59 year old male with PMH significant for CAD, MI s/p CABG, NSTEMI, chronic combined systolic and diastolic CHF, SVT, s/p PPM/ACID, COPD, HTN, HLD, CVA, thyroid disease, bipolar 1 disorder and tobacco use admitted foracute respiratory distress likely secondary to COPD and CHF exacerbation  Acute respiratory failure with hypoxia (HCC) - initially needed BiPAPand now weaned off to RA -due to COPD/CHF exacerbation -Covid negative  COPD exacerbation (HCC) -Improved with steroids, empiric antibiotics and bronchodilator.  He is no longer wheezing  and is at baseline now -Patient requested nicotine patch while in the hospital which was ordered and will continue at discharge per his request  Acute on chronic systolic CHF, AICD -Echo on this admission showed EF of 25 to 30% -Patient is diuresed about 10.7 L since admission with IV Lasix, will switch him to Lasix 20 mg p.o. daily as needed (for weight > 3 lbs/day or leg edema) at D/C until he follows up with cardiology and/or CHF clinic considering some worsening of kidney function       ReDS Vest / Clip - 11/19/19 1240            ReDS Vest / Clip   Station Marker  D    Ruler Value  34    ReDS Value Range  Low volume    ReDS Actual Value  33       Elevated troponin,due todemand ischemia Coronary artery diseasewith history of CABG -Troponin elevated at 49->106->73->67, due to demand ischemia from increased work of breathing -Patient denies chest pain and EKG with no acute ST-T wave changes though with old left bundle branch block -Continue home carvedilol, clopidogrel   Acute kidney injury -Creatinine 1.4 -change Lasix to as needed basis on discharge.  Recommend repeating labs as an outpatient to monitor kidney function  Essential hypertension -Continue carvedilol and lisinopril. Lasix (as need) and lisinopril may need to be held if her kidney function continues to get worse as an outpatient  History of stroke -Continue clopidogrel. Not currently on statin   Hypothyroidism -Continue levothyroxine  Abdominal distention Ultrasound not showing significant amount of fluid for ascites tap  DISCHARGE CONDITIONS:  Stable CONSULTS OBTAINED:   DRUG ALLERGIES:   Allergies  Allergen Reactions  . Iodinated Diagnostic Agents Shortness Of Breath  . Penicillins Anaphylaxis and Shortness Of Breath    Respiratory  Tolerated cefuroxime on 01/06/16  . Strawberry Extract Anaphylaxis  . Cefepime Itching    Empiric antibiotic, developed pruritis.   .  Erythromycin Itching  . Sulfa Antibiotics Itching, Nausea And Vomiting and Nausea Only   DISCHARGE MEDICATIONS:   Allergies as of 11/22/2019      Reactions   Iodinated Diagnostic Agents Shortness Of Breath   Penicillins Anaphylaxis, Shortness Of Breath   Respiratory  Tolerated cefuroxime on 01/06/16   Strawberry Extract Anaphylaxis   Cefepime Itching   Empiric antibiotic, developed pruritis.    Erythromycin Itching   Sulfa Antibiotics Itching, Nausea And Vomiting, Nausea Only      Medication List    TAKE these medications   albuterol 108 (90 Base) MCG/ACT inhaler Commonly known as: VENTOLIN HFA Inhale 2 puffs into the lungs every 6 (six) hours as needed for wheezing.   aspirin EC 81 MG tablet Take 1 tablet (81 mg total) by mouth daily.   carvedilol 6.25 MG tablet Commonly known as: COREG Take 1 tablet (6.25 mg total) by mouth 2 (two) times daily.   clopidogrel 75 MG tablet Commonly known as: PLAVIX Take 1 tablet (75 mg total) by mouth daily.   divalproex 500 MG 24 hr tablet Commonly known as: DEPAKOTE ER Take 1 tablet (500 mg total) by mouth 2 (two) times daily.   escitalopram 10 MG tablet Commonly known as: LEXAPRO Take 1 tablet (10 mg total) by mouth at bedtime.   Fluticasone-Salmeterol 250-50 MCG/DOSE Aepb Commonly known as: Advair Diskus Inhale 1 puff into the lungs 2 (two) times daily.   furosemide 20 MG tablet Commonly known as: Lasix Take 1 tablet (20 mg total) by mouth daily as needed.   levothyroxine 25 MCG tablet Commonly known as: SYNTHROID Take 1 tablet (25 mcg total) by mouth daily.   lisinopril 2.5 MG tablet Commonly known as: ZESTRIL Take 1 tablet (2.5 mg total) by mouth daily.   nicotine 21 mg/24hr patch Commonly known as: NICODERM CQ - dosed in mg/24 hours Place 1 patch (21 mg total) onto the skin daily. Start taking on: Nov 23, 2019   omeprazole 20 MG capsule Commonly known as: PRILOSEC Take 20 mg by mouth daily.   polyethylene  glycol powder 17 GM/SCOOP powder Commonly known as: GLYCOLAX/MIRALAX Take 17 g (1 packet mixed in 4-8 oz of liquid)  by mouth daily as needed for moderate constipation.   QUEtiapine 100 MG tablet Commonly known as: SEROQUEL Take 100 mg by mouth at bedtime.   QUETIAPINE FUMARATE ER PO Take 500 mg by mouth in the morning and at bedtime.   simvastatin 40 MG tablet Commonly known as: ZOCOR Take 40 mg by mouth at bedtime.   Spiriva HandiHaler 18 MCG inhalation capsule Generic drug: tiotropium Place 1 capsule (18 mcg total) into inhaler and inhale daily.      DISCHARGE INSTRUCTIONS:  Recommend BMP recheck in 2 days (11/24/2019) with results to PCP and cardiology to monitor kidney function and adjust Lasix and lisinopril if needed DIET:  Renal diet DISCHARGE CONDITION:  Stable ACTIVITY:  Activity as tolerated OXYGEN:  Home Oxygen: No.  Oxygen Delivery: room air DISCHARGE LOCATION:  group home   If you experience worsening of your  admission symptoms, develop shortness of breath, life threatening emergency, suicidal or homicidal thoughts you must seek medical attention immediately by calling 911 or calling your MD immediately  if symptoms less severe.  You Must read complete instructions/literature along with all the possible adverse reactions/side effects for all the Medicines you take and that have been prescribed to you. Take any new Medicines after you have completely understood and accpet all the possible adverse reactions/side effects.   Please note  You were cared for by a hospitalist during your hospital stay. If you have any questions about your discharge medications or the care you received while you were in the hospital after you are discharged, you can call the unit and asked to speak with the hospitalist on call if the hospitalist that took care of you is not available. Once you are discharged, your primary care physician will handle any further medical issues. Please note  that NO REFILLS for any discharge medications will be authorized once you are discharged, as it is imperative that you return to your primary care physician (or establish a relationship with a primary care physician if you do not have one) for your aftercare needs so that they can reassess your need for medications and monitor your lab values.    On the day of Discharge:  VITAL SIGNS:  Blood pressure 135/86, pulse 72, temperature 97.7 F (36.5 C), resp. rate 18, height 5' 9.5" (1.765 m), weight 86.6 kg, SpO2 97 %. PHYSICAL EXAMINATION:  GENERAL:  59 y.o.-year-old patient lying in the bed with no acute distress.  EYES: Pupils equal, round, reactive to light and accommodation. No scleral icterus. Extraocular muscles intact.  HEENT: Head atraumatic, normocephalic. Oropharynx and nasopharynx clear.  NECK:  Supple, no jugular venous distention. No thyroid enlargement, no tenderness.  LUNGS: Normal breath sounds bilaterally, no wheezing, rales,rhonchi or crepitation. No use of accessory muscles of respiration.  CARDIOVASCULAR: S1, S2 normal. No murmurs, rubs, or gallops.  ABDOMEN: Soft, non-tender, non-distended. Bowel sounds present. No organomegaly or mass.  EXTREMITIES: No pedal edema, cyanosis, or clubbing.  NEUROLOGIC: Cranial nerves II through XII are intact. Muscle strength 5/5 in all extremities. Sensation intact. Gait not checked.  PSYCHIATRIC: The patient is alert and oriented x 3.  SKIN: No obvious rash, lesion, or ulcer.  DATA REVIEW:   CBC Recent Labs  Lab 11/22/19 0416  WBC 13.3*  HGB 14.2  HCT 39.9  PLT 240    Chemistries  Recent Labs  Lab 11/18/19 0249 11/19/19 0519 11/20/19 0552 11/21/19 0445 11/22/19 0416  NA 140   < > 136   < > 131*  K 4.5   < > 4.2   < > 4.5  CL 109   < > 99   < > 94*  CO2 18*   < > 28   < > 25  GLUCOSE 187*   < > 121*   < > 113*  BUN 13   < > 49*   < > 63*  CREATININE 1.28*   < > 1.02   < > 1.40*  CALCIUM 8.7*   < > 9.5   < > 9.7  MG 1.9   --   --   --   --   AST 35  --  21  --   --   ALT 23  --  23  --   --   ALKPHOS 62  --  48  --   --   BILITOT 0.7  --  0.6  --   --    < > = values in this interval not displayed.     Outpatient follow-up Follow-up Information    [facility] Follow up on 11/25/2019.   Specialty: Cardiology Why: at 3:00pm. Enter through the Medical Mall entrance Contact information: [address] [place] [address] [phone]       [facility]. Schedule an appointment as soon as possible for a visit in 1 week(s).   Contact information: [address] [place] [address] [phone]        Antonieta Iba, MD. Schedule an appointment as soon as possible for a visit in 1 week(s).   Specialty: Cardiology Contact information: [address] [place] [address] [phone]        Vida Rigger, MD. Schedule an appointment as soon as possible for a visit in 1 week(s).   Specialty: Pulmonary Disease Contact information: [address] [place] [address] [phone]            Management plans discussed with the patient, family and they are in agreement.  CODE STATUS: Full Code   TOTAL TIME TAKING CARE OF THIS PATIENT: 45 minutes.    Delfino Lovett M.D on 11/22/2019 at 12:11 PM  [facility]   CC: Primary care physician; [facility]   Note: This dictation was prepared with Dragon dictation along with smaller phrase technology. Any transcriptional errors that result from this process are unintentional.

## 2019-11-23 ENCOUNTER — Telehealth: Payer: Self-pay | Admitting: Family

## 2019-11-23 ENCOUNTER — Telehealth: Payer: Self-pay

## 2019-11-23 NOTE — Telephone Encounter (Signed)
Spoke to staff at [facility] who confirmed Carl [name]'s New patient CHF Clinic appointment on 5/20. They said he is doing well by taking his medications daily, daily weight, and following a low sodium diet. He is not exercising and and patient has no complaints of any symptoms related to HF. Patient does have legal guardian.    [name], [place]

## 2019-11-23 NOTE — Telephone Encounter (Signed)
hosp fu Received: [employer] stream[employer], Ladona Ridgel R  P C[facility] Scheduling  LVM for patient to call and schedule hosp fu

## 2019-11-23 NOTE — Telephone Encounter (Signed)
Attempted to schedule no ans no vm  

## 2019-11-23 NOTE — Telephone Encounter (Signed)
-----   Message from Muhammad A Arida, MD sent at 11/22/2019 12:17 PM EDT ----- This is a new patient being discharged from [facility] today.  They wanted him to establish with our clinic as a new patient regarding CAD and heart failure.  Please schedule with first available MD. Previous care at [facility]  

## 2019-11-24 NOTE — Progress Notes (Deleted)
   Patient ID: Bradly Sangiovanni, male    DOB: May 07, 1961, 59 y.o.   MRN: [id number]  HPI  Mr Beevers is a 59 y/o male with a history of  Echo report from 11/18/19 reviewed and showed an EF of 25-30%.  Admitted 11/18/19 due to acute on chronic HF. Cardiology consult obtained. Needed bipap and then weaned off to room air. Initially needed IV lasix and then transitioned to oral diuretics with resultant loss of 10.7 L. REDS vest reading was 33. Elevated troponin thought to be due to demand ischemia. Discharged after 4 days.   He presents today for his initial visit with a chief complaint of  Review of Systems    Physical Exam    Assessment & Plan:  1: Chronic heart failure with reduced ejection fraction- - NYHA class - BNP 11/18/19 was 1292.0  2: HTN- - BP - saw PCP Pascal Lux @ [facility]) 05/06/2019 - BMP 11/22/19 reviewed and showed sodium 131, potassium 4.5, creatinine 1.4 and GFR 54

## 2019-11-25 ENCOUNTER — Ambulatory Visit: Payer: Medicaid Other | Admitting: Family

## 2019-11-25 NOTE — Telephone Encounter (Signed)
Attempted to schedule no ans no vm  

## 2019-11-25 NOTE — Telephone Encounter (Signed)
-----   Message from Iran Ouch, MD sent at 11/22/2019 12:17 PM EDT ----- This is a new patient being discharged from [facility] today.  They wanted him to establish with our clinic as a new patient regarding CAD and heart failure.  Please schedule with first available MD. Previous care at [facility]

## 2019-12-02 ENCOUNTER — Ambulatory Visit (INDEPENDENT_AMBULATORY_CARE_PROVIDER_SITE_OTHER): Payer: Medicaid Other | Admitting: Cardiovascular Disease

## 2019-12-02 ENCOUNTER — Other Ambulatory Visit: Payer: Self-pay

## 2019-12-02 ENCOUNTER — Encounter: Payer: Self-pay | Admitting: Cardiovascular Disease

## 2019-12-02 ENCOUNTER — Encounter: Payer: Self-pay | Admitting: Family

## 2019-12-02 ENCOUNTER — Ambulatory Visit: Payer: Medicaid Other | Attending: Family | Admitting: Family

## 2019-12-02 VITALS — BP 119/67 | HR 71 | Resp 18 | Ht 69.0 in | Wt 194.0 lb

## 2019-12-02 VITALS — BP 96/60 | HR 64 | Ht 69.0 in | Wt 194.2 lb

## 2019-12-02 DIAGNOSIS — I252 Old myocardial infarction: Secondary | ICD-10-CM | POA: Diagnosis not present

## 2019-12-02 DIAGNOSIS — F319 Bipolar disorder, unspecified: Secondary | ICD-10-CM | POA: Diagnosis not present

## 2019-12-02 DIAGNOSIS — I471 Supraventricular tachycardia: Secondary | ICD-10-CM | POA: Diagnosis not present

## 2019-12-02 DIAGNOSIS — Z79899 Other long term (current) drug therapy: Secondary | ICD-10-CM | POA: Insufficient documentation

## 2019-12-02 DIAGNOSIS — J449 Chronic obstructive pulmonary disease, unspecified: Secondary | ICD-10-CM | POA: Insufficient documentation

## 2019-12-02 DIAGNOSIS — I10 Essential (primary) hypertension: Secondary | ICD-10-CM

## 2019-12-02 DIAGNOSIS — F419 Anxiety disorder, unspecified: Secondary | ICD-10-CM | POA: Diagnosis not present

## 2019-12-02 DIAGNOSIS — I739 Peripheral vascular disease, unspecified: Secondary | ICD-10-CM | POA: Diagnosis not present

## 2019-12-02 DIAGNOSIS — Z882 Allergy status to sulfonamides status: Secondary | ICD-10-CM | POA: Insufficient documentation

## 2019-12-02 DIAGNOSIS — Z8673 Personal history of transient ischemic attack (TIA), and cerebral infarction without residual deficits: Secondary | ICD-10-CM | POA: Diagnosis not present

## 2019-12-02 DIAGNOSIS — M79606 Pain in leg, unspecified: Secondary | ICD-10-CM | POA: Insufficient documentation

## 2019-12-02 DIAGNOSIS — I130 Hypertensive heart and chronic kidney disease with heart failure and stage 1 through stage 4 chronic kidney disease, or unspecified chronic kidney disease: Secondary | ICD-10-CM | POA: Insufficient documentation

## 2019-12-02 DIAGNOSIS — R079 Chest pain, unspecified: Secondary | ICD-10-CM

## 2019-12-02 DIAGNOSIS — Z87891 Personal history of nicotine dependence: Secondary | ICD-10-CM | POA: Insufficient documentation

## 2019-12-02 DIAGNOSIS — I5022 Chronic systolic (congestive) heart failure: Secondary | ICD-10-CM

## 2019-12-02 DIAGNOSIS — Z881 Allergy status to other antibiotic agents status: Secondary | ICD-10-CM | POA: Insufficient documentation

## 2019-12-02 DIAGNOSIS — Z7982 Long term (current) use of aspirin: Secondary | ICD-10-CM | POA: Insufficient documentation

## 2019-12-02 DIAGNOSIS — Z79890 Hormone replacement therapy (postmenopausal): Secondary | ICD-10-CM | POA: Insufficient documentation

## 2019-12-02 DIAGNOSIS — Z7951 Long term (current) use of inhaled steroids: Secondary | ICD-10-CM | POA: Insufficient documentation

## 2019-12-02 DIAGNOSIS — Z720 Tobacco use: Secondary | ICD-10-CM

## 2019-12-02 DIAGNOSIS — Z880 Allergy status to penicillin: Secondary | ICD-10-CM | POA: Insufficient documentation

## 2019-12-02 DIAGNOSIS — N189 Chronic kidney disease, unspecified: Secondary | ICD-10-CM | POA: Diagnosis not present

## 2019-12-02 DIAGNOSIS — I25118 Atherosclerotic heart disease of native coronary artery with other forms of angina pectoris: Secondary | ICD-10-CM

## 2019-12-02 NOTE — Patient Instructions (Signed)
Medication Instructions:  Your physician recommends that you continue on your current medications as directed. Please refer to the Current Medication list given to you today.  *If you need a refill on your cardiac medications before your next appointment, please call your pharmacy*   Lab Work: None ordered  If you have labs (blood work) drawn today and your tests are completely normal, you will receive your results only by: [name] MyChart Message (if you have MyChart) OR . A paper copy in the mail If you have any lab test that is abnormal or we need to change your treatment, we will call you to review the results.   Testing/Procedures: 1- [place]  Your caregiver has ordered a Stress Test with nuclear imaging. The purpose of this test is to evaluate the blood supply to your heart muscle. This procedure is referred to as a "Non-Invasive Stress Test." This is because other than having an IV started in your vein, nothing is inserted or "invades" your body. Cardiac stress tests are done to find areas of poor blood flow to the heart by determining the extent of coronary artery disease (CAD). Some patients exercise on a treadmill, which naturally increases the blood flow to your heart, while others who are  unable to walk on a treadmill due to physical limitations have a pharmacologic/chemical stress agent called Lexiscan . This medicine will mimic walking on a treadmill by temporarily increasing your coronary blood flow.   Please note: these test may take anywhere between 2-4 hours to complete  PLEASE REPORT TO [facility] AT THE FIRST DESK WILL DIRECT YOU WHERE TO GO  Date of Procedure:_____________________________________  Arrival Time for Procedure:______________________________  Instructions regarding medication:   __x__:  Hold betablocker(s) night before procedure and morning of procedure (coreg)  __x__:  Hold other medications as follows:________Lasix  hold morning of ___________________________________________________________________________  PLEASE NOTIFY THE OFFICE AT LEAST 24 HOURS IN ADVANCE IF YOU ARE UNABLE TO KEEP YOUR APPOINTMENT.  [phone] AND  PLEASE NOTIFY NUCLEAR MEDICINE AT [facility] AT LEAST 24 HOURS IN ADVANCE IF YOU ARE UNABLE TO KEEP YOUR APPOINTMENT. [phone]  How to prepare for your Myoview test:  1. Do not eat or drink after midnight 2. No caffeine for 24 hours prior to test 3. No smoking 24 hours prior to test. 4. Your medication may be taken with water.  If your doctor stopped a medication because of this test, do not take that medication. 5. Ladies, please do not wear dresses.  Skirts or pants are appropriate. Please wear a short sleeve shirt. 6. No perfume, cologne or lotion. 7. Wear comfortable walking shoes. No heels!  2- Your physician has requested that you have a lower extremity arterial exercise duplex. During this test, exercise and ultrasound are used to evaluate arterial blood flow in the legs. Allow one hour for this exam. There are no restrictions or special instructions.  3- Your physician has requested that you have an ankle brachial index (ABI). During this test an ultrasound and blood pressure cuff are used to evaluate the arteries that supply the arms and legs with blood. Allow thirty minutes for this exam. There are no restrictions or special instructions.     Follow-Up: At [facility], you and your health needs are our priority.  As part of our continuing mission to provide you with exceptional heart care, we have created designated Provider Care Teams.  These Care Teams include your primary Cardiologist (physician) and Advanced  Practice Providers (APPs -  Physician Assistants and Nurse Practitioners) who all work together to provide you with the care you need, when you need it.  We recommend signing up for the patient portal called "MyChart".  Sign up information is provided on this After  Visit Summary.  MyChart is used to connect with patients for Virtual Visits (Telemedicine).  Patients are able to view lab/test results, encounter notes, upcoming appointments, etc.  Non-urgent messages can be sent to your provider as well.   To learn more about what you can do with MyChart, go to [email or web address].    Your next appointment:   1 month(s)  The format for your next appointment:   In Person  Provider:    You may see Dr. Kirke Corin or one of the following Advanced Practice Providers on your designated Care Team:    Nicolasa Ducking, NP  Eula Listen, PA-C  Marisue Ivan, PA-C

## 2019-12-02 NOTE — Progress Notes (Signed)
Patient ID: Carl [name], male    DOB: Jun 18, 1961, 59 y.o.   MRN: [id number]  HPI  Carl [name] is a 59 y/o male with a history of HTN, CKD, stroke, COPD, NSTEMI, bipolar, depression, seizures, current tobacco use and chronic heart failure.   Echo report from 11/18/19 reviewed and showed an EF of 25-30%.  Admitted 11/18/19 due to acute on chronic HF. Cardiology consult obtained. Needed bipap and then weaned off to room air. Initially needed IV lasix and then transitioned to oral diuretics with resultant loss of 10.7 L. REDS vest reading was 33. Elevated troponin thought to be due to demand ischemia. Discharged after 4 days.   He presents today for his initial visit with a chief complaint of minimal shortness of breath upon moderate exertion. He describes this as chronic in nature having been present for several months. He has associated fatigue, intermittent chest pain, leg pain, light-headedness, anxiety and chronic difficulty sleeping along with this. He denies any abdominal distention, palpitations, pedal edema, cough or change in appetite.  He just saw cardiology earlier today who will be ordering a stress test along with dopplers of lower extremities.   Past Medical History:  Diagnosis Date  . Acute pulmonary edema ([place]) 2017  . Acute respiratory failure with hypoxia ([place]) 2017  . Bipolar 1 disorder ([place])   . CHF (congestive heart failure) ([place])   . Chronic kidney disease   . COPD (chronic obstructive pulmonary disease) ([place])   . Depression   . Hypertension   . Myocardial infarction ([place])   . NSTEMI (non-ST elevated myocardial infarction) ([place])   . 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 Seizures ([place])    childhood  . Stroke ([place])   . Tobacco abuse    History reviewed. No pertinent surgical history. History reviewed. No pertinent family history. Social History   Tobacco Use  . Smoking status: Former Smoker    Packs/day: 0.00    Years: 0.00    Pack  years: 0.00  . Smokeless tobacco: Never Used  Substance Use Topics  . Alcohol use: Not Currently   Allergies  Allergen Reactions  . Iodinated Diagnostic Agents Shortness Of Breath  . Penicillins Anaphylaxis and Shortness Of Breath    Respiratory  Tolerated cefuroxime on 01/06/16  . Strawberry Extract Anaphylaxis  . Cefepime Itching    Empiric antibiotic, developed pruritis.   . Erythromycin Itching  . Sulfa Antibiotics Itching, Nausea And Vomiting and Nausea Only   Prior to Admission medications   Medication Sig Start Date End Date Taking? Authorizing Provider  acetaminophen (TYLENOL) 500 MG tablet Take 500 mg by mouth every 6 (six) hours as needed for mild pain.   Yes [provider]  albuterol (VENTOLIN HFA) 108 (90 Base) MCG/ACT inhaler Inhale 2 puffs into the lungs every 6 (six) hours as needed for wheezing. 05/24/19 01/08/20 Yes Clapacs, Madie Reno, MD  aspirin EC 81 MG tablet Take 1 tablet (81 mg total) by mouth daily. 05/24/19  Yes Clapacs, Madie Reno, MD  carvedilol (COREG) 6.25 MG tablet Take 1 tablet (6.25 mg total) by mouth 2 (two) times daily. 05/24/19 12/04/19 Yes Clapacs, Madie Reno, MD  clopidogrel (PLAVIX) 75 MG tablet Take 1 tablet (75 mg total) by mouth daily. 05/25/19  Yes Clapacs, Madie Reno, MD  divalproex (DEPAKOTE ER) 500 MG 24 hr tablet Take 1 tablet (500 mg total) by mouth 2 (two) times daily. 05/24/19  Yes Clapacs, Madie Reno, MD  escitalopram (LEXAPRO) 10  MG tablet Take 1 tablet (10 mg total) by mouth at bedtime. 05/24/19  Yes Clapacs, Jackquline Denmark, MD  Fluticasone-Salmeterol (ADVAIR DISKUS) 250-50 MCG/DOSE AEPB Inhale 1 puff into the lungs 2 (two) times daily. 11/22/19 12/22/19 Yes Delfino Lovett, MD  furosemide (LASIX) 20 MG tablet Take 1 tablet (20 mg total) by mouth daily as needed. 11/22/19 11/21/20 Yes Delfino Lovett, MD  levothyroxine (SYNTHROID) 25 MCG tablet Take 1 tablet (25 mcg total) by mouth daily. 05/24/19 01/08/20 Yes Clapacs, Jackquline Denmark, MD  lisinopril (ZESTRIL) 2.5 MG tablet Take 1  tablet (2.5 mg total) by mouth daily. 05/24/19 01/08/20 Yes Clapacs, Jackquline Denmark, MD  nicotine (NICODERM CQ - DOSED IN MG/24 HOURS) 21 mg/24hr patch Place 1 patch (21 mg total) onto the skin daily. 11/23/19  Yes Delfino Lovett, MD  omeprazole (PRILOSEC) 20 MG capsule Take 20 mg by mouth daily.   Yes [provider]  QUEtiapine (SEROQUEL) 100 MG tablet Take 100 mg by mouth at bedtime.   Yes [provider]  QUETIAPINE FUMARATE ER PO Take 50 mg by mouth daily. Taking 1 tablet of 50MG  daily in the morning.   Yes [provider]  simvastatin (ZOCOR) 40 MG tablet Take 40 mg by mouth at bedtime.   Yes [provider]  tiotropium (SPIRIVA HANDIHALER) 18 MCG inhalation capsule Place 1 capsule (18 mcg total) into inhaler and inhale daily. 11/22/19 12/22/19 Yes 12/24/19, MD  polyethylene glycol powder (GLYCOLAX/MIRALAX) 17 GM/SCOOP powder Take 17 g (1 packet mixed in 4-8 oz of liquid)  by mouth daily as needed for moderate constipation. 03/23/19   [provider]    Review of Systems  Constitutional: Positive for fatigue. Negative for appetite change.  HENT: Negative for congestion, postnasal drip and sore throat.   Eyes: Negative.   Respiratory: Positive for shortness of breath. Negative for cough.   Cardiovascular: Positive for chest pain (sometimes; worse when stressed or upset). Negative for palpitations and leg swelling.  Gastrointestinal: Negative for abdominal distention and abdominal pain.  Endocrine: Negative.   Genitourinary: Negative.   Musculoskeletal: Positive for arthralgias (leg pain). Negative for gait problem.  Skin: Negative.   Allergic/Immunologic: Negative.   Neurological: Positive for light-headedness. Negative for dizziness.  Hematological: Negative for adenopathy. Does not bruise/bleed easily.  Psychiatric/Behavioral: Positive for sleep disturbance (off and on at times; sleeping on 2 pillows). Negative for dysphoric mood. The patient is  nervous/anxious.     Vitals:   12/02/19 1051  BP: 119/67  Pulse: 71  Resp: 18  SpO2: 98%  Weight: 194 lb (88 kg)  Height: 5\' 9"  (1.753 m)   Wt Readings from Last 3 Encounters:  12/02/19 194 lb (88 kg)  12/02/19 194 lb 4 oz (88.1 kg)  11/22/19 191 lb (86.6 kg)   Lab Results  Component Value Date   CREATININE 1.40 (H) 11/22/2019   CREATININE 1.27 (H) 11/21/2019   CREATININE 1.02 11/20/2019    Physical Exam Vitals and nursing note reviewed. Exam conducted with a chaperone present.  Constitutional:      Appearance: Normal appearance.  HENT:     Head: Normocephalic and atraumatic.  Cardiovascular:     Rate and Rhythm: Normal rate and regular rhythm.  Pulmonary:     Effort: Pulmonary effort is normal. No respiratory distress.     Breath sounds: No wheezing or rales.  Abdominal:     Palpations: Abdomen is soft.     Tenderness: There is no abdominal tenderness.  Musculoskeletal:  Cervical back: Normal range of motion and neck supple.     Right lower leg: No edema.     Left lower leg: No edema.  Skin:    General: Skin is warm and dry.  Neurological:     General: No focal deficit present.     Mental Status: He is alert and oriented to person, place, and time.  Psychiatric:        Mood and Affect: Mood is anxious.        Behavior: Behavior normal.    Assessment & Plan:  1: Chronic heart failure with reduced ejection fraction- - NYHA class II - euvolemic today - not weighing daily but staff member says that they can do that; advised to weigh daily and call for an overnight weight gain of >2 pounds or a weekly weight gain of >5 pounds - not adding salt and facility doesn't cook with salt either - saw cardiology Kirke Corin) earlier today; discussion about changing lisinopril to entresto in the future as well as adding spironolactone if able - stress test to be ordered - has ICD; cardiology referred to EP Graciela Husbands)  - BNP 11/18/19 was 1292.0 - PharmD reconciled facility  medications   2: HTN- - BP looks good today although was low at earlier cardiology visit - saw PCP Pascal Lux @ [facility]) 05/06/2019 - BMP 11/22/19 reviewed and showed sodium 131, potassium 4.5, creatinine 1.4 and GFR 54  3: Tobacco use- - currently wearing nicotine patch 21mg  every other day - patient says that he doesn't smoke but caregiver says that she thinks he does - complete cessation discussed for 3 minutes with him  4: Bipolar- - currently living at [facility] - patient cooperative   Facility medication list reviewed.   Return in 2 months or sooner for any questions/problems before then.

## 2019-12-02 NOTE — Patient Instructions (Addendum)
Begin weighing daily and call for an overnight weight gain of > 2 pounds or a weekly weight gain of >5 pounds. 

## 2019-12-02 NOTE — Progress Notes (Signed)
Cardiology Office Note   Date:  12/02/2019   ID:  Carl [name], DOB 09/18/1960, MRN [id number]  PCP:  Orvis Brill, [facility]  Cardiologist:   Kathlyn Sacramento, MD   Chief Complaint  Patient presents with  . office visit    Pt concern w/ area on chest getting bigger/ pain in joints (hips/ Knees)/ swelling. Meds verbally reviewed w/ pt.       History of Present Illness: Carl [name] is a 59 y.o. male who presents to establish cardiovascular care.  He was previously followed by [facility] cardiology. He has history of coronary artery disease with previous myocardial infarction in 2017 status post CABG, chronic systolic heart failure due to ischemic cardiomyopathy status post ICD placement in 2017, SVT, COPD, essential hypertension, hyperlipidemia, previous stroke, bipolar disorder and tobacco use.  The patient has significant mental health issues.  He used to have problems with follow-up when he was living with his daughter.  Social services got involved and he was moved to an assisted living facility.  [facility] lives at [facility].  He was recently smoking outside and became short of breath.  EMS called and he was noted to be in respiratory distress with oxygen saturation in the 40s.  He was placed on CPAP.  He was tachycardic and tachypneic on presentation.  High-sensitivity troponin was mildly elevated at 106.  BNP was 1300.  Chest x-ray showed cardiomegaly without pulmonary edema.  He was treated for COPD and also was given IV furosemide.  Previous echocardiogram in 2020 showed an EF of 20 to 25%.  Repeat echocardiogram during hospitalization showed an EF of 25 to 30% with mild biatrial enlargement. He reports improvement in symptoms since hospital discharge.  Currently he is taking furosemide 20 mg once daily.  He is trying to quit smoking with the help of nicotine patch.  He reports episodes of chest pain while he was hospitalized described as tightness feeling.  No recent  ischemic evaluation. Patient does have underlying left bundle branch block.  There was discussion about upgrading his device to CRT but that never happened. He has been having bilateral leg pain at rest and with walking.   Past Medical History:  Diagnosis Date  . Acute pulmonary edema ([place]) 2017  . Acute respiratory failure with hypoxia ([place]) 2017  . Bipolar 1 disorder ([place])   . COPD (chronic obstructive pulmonary disease) ([place])   . Depression   . Hypertension   . Myocardial infarction ([place])   . NSTEMI (non-ST elevated myocardial infarction) ([place])   . 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 Seizures ([place])    childhood  . Stroke ([place])   . Tobacco abuse     History reviewed. No pertinent surgical history.   Current Outpatient Medications  Medication Sig Dispense Refill  . albuterol (VENTOLIN HFA) 108 (90 Base) MCG/ACT inhaler Inhale 2 puffs into the lungs every 6 (six) hours as needed for wheezing. 8 g 1  . aspirin EC 81 MG tablet Take 1 tablet (81 mg total) by mouth daily. 30 tablet 1  . carvedilol (COREG) 6.25 MG tablet Take 1 tablet (6.25 mg total) by mouth 2 (two) times daily. 60 tablet 1  . clopidogrel (PLAVIX) 75 MG tablet Take 1 tablet (75 mg total) by mouth daily. 30 tablet 1  . divalproex (DEPAKOTE ER) 500 MG 24 hr tablet Take 1 tablet (500 mg total) by mouth 2 (two) times daily. 60 tablet 1  . escitalopram (  LEXAPRO) 10 MG tablet Take 1 tablet (10 mg total) by mouth at bedtime. 30 tablet 1  . Fluticasone-Salmeterol (ADVAIR DISKUS) 250-50 MCG/DOSE AEPB Inhale 1 puff into the lungs 2 (two) times daily. 60 each 0  . furosemide (LASIX) 20 MG tablet Take 1 tablet (20 mg total) by mouth daily as needed. 30 tablet 11  . levothyroxine (SYNTHROID) 25 MCG tablet Take 1 tablet (25 mcg total) by mouth daily. 30 tablet 1  . lisinopril (ZESTRIL) 2.5 MG tablet Take 1 tablet (2.5 mg total) by mouth daily. 30 tablet 1  . nicotine (NICODERM CQ - DOSED IN MG/24  HOURS) 21 mg/24hr patch Place 1 patch (21 mg total) onto the skin daily. 28 patch 0  . omeprazole (PRILOSEC) 20 MG capsule Take 20 mg by mouth daily.    . polyethylene glycol powder (GLYCOLAX/MIRALAX) 17 GM/SCOOP powder Take 17 g (1 packet mixed in 4-8 oz of liquid)  by mouth daily as needed for moderate constipation.    . QUEtiapine (SEROQUEL) 100 MG tablet Take 100 mg by mouth at bedtime.    [name] QUETIAPINE FUMARATE ER PO Take 50 mg by mouth daily. Taking 1 tablet of 50MG  daily in the morning.    . simvastatin (ZOCOR) 40 MG tablet Take 40 mg by mouth at bedtime.    tiotropium (SPIRIVA HANDIHALER) 18 MCG inhalation capsule Place 1 capsule (18 mcg total) into inhaler and inhale daily. 30 capsule 0   No current facility-administered medications for this visit.    Allergies:   Iodinated diagnostic agents, Penicillins, Strawberry extract, Cefepime, Erythromycin, and Sulfa antibiotics    Social History:  The patient  reports that he has quit smoking. He smoked 0.00 packs per day for 0.00 years. He has never used smokeless tobacco. He reports previous alcohol use. He reports that he does not use drugs.   Family History:  The patient does not know his family history.   ROS:  Please see the history of present illness.   Otherwise, review of systems are positive for none.   All other systems are reviewed and negative.    PHYSICAL EXAM: VS:  BP 96/60 (BP Location: Left Arm, Patient Position: Sitting, Cuff Size: Normal)   Pulse 64   Ht 5\' 9"  (1.753 m)   Wt 194 lb 4 oz (88.1 kg)   SpO2 98%   BMI 28.69 kg/m  , BMI Body mass index is 28.69 kg/m. GEN: Well nourished, well developed, in no acute distress  HEENT: normal  Neck: no JVD, carotid bruits, or masses Cardiac: RRR; no murmurs, rubs, or gallops, mild bilateral leg edema. Respiratory:  clear to auscultation bilaterally, normal work of breathing GI: soft, nontender, nondistended, + BS MS: no deformity or atrophy  Skin: warm and dry, no  rash Neuro:  Strength and sensation are intact Psych: euthymic mood, full affect   EKG:  EKG is ordered today. The ekg ordered today demonstrates normal sinus rhythm with left bundle branch block.  QRS duration is 172 ms.   Recent Labs: 05/24/2019: TSH 4.523 11/18/2019: B Natriuretic Peptide 1,292.0; Magnesium 1.9 11/20/2019: ALT 23 11/22/2019: BUN 63; Creatinine, Ser 1.40; Hemoglobin 14.2; Platelets 240; Potassium 4.5; Sodium 131    Lipid Panel    Component Value Date/Time   CHOL 192 05/15/2019 0651   TRIG 281 (H) 05/15/2019 0651   HDL 46 05/15/2019 0651   CHOLHDL 4.2 05/15/2019 0651   VLDL 56 (H) 05/15/2019 0651   LDLCALC 90 05/15/2019 13/01/2019  Wt Readings from Last 3 Encounters:  12/02/19 194 lb 4 oz (88.1 kg)  11/22/19 191 lb (86.6 kg)  05/13/19 170 lb (77.1 kg)      Other studies Reviewed: Additional studies/ records that were reviewed today include: I reviewed his previous records from [facility] and recent hospitalizations at [facility].. Review of the above records demonstrates: Findings summarized above.  Spent at least 30 minutes reviewing records.  No flowsheet data found.    ASSESSMENT AND PLAN:  1.  Chronic systolic heart failure: Due to ischemic cardiomyopathy with recent echo showing an EF of 25 to 30%.  He is currently [place] heart association class III with recent hospitalization for heart failure and COPD exacerbation.  He appears to be mildly volume overloaded and currently is on small dose furosemide 20 mg daily.  He did have issues with worsening renal function recently during hospitalization and thus am going to keep him on the same dose for now. We should consider switching lisinopril to [facility].  However, his systolic blood pressure is below 100 and not able to do that at the present time.  This will be considered in the near future.  In addition, we should consider adding spironolactone.  2.  Coronary artery disease involving native coronary arteries with  other forms of angina: The patient had recent chest pain in the setting of heart failure exacerbation with no recent ischemic evaluation.  I requested a Lexiscan Myoview.  3.  Status post ICD placement: He does have underlying left bundle branch block.  I am going to refer to Dr. Graciela Husbands to establish with our device clinic and also consider upgrading his device to CRT.  4.  Tobacco use: We had a prolonged discussion about the importance of smoking cessation.  He is currently using a nicotine patch 21 mg.  We can consider decreasing the dose to 14 in 1 month.  5.  Bilateral leg pain: Overall symptoms atypical for claudication but he has prolonged history of tobacco use and I requested lower extremity arterial Doppler.   Disposition:   FU with me in 1 month  Signed,  Lorine Bears, MD  12/02/2019 9:55 AM    [facility]

## 2019-12-09 ENCOUNTER — Other Ambulatory Visit: Payer: Self-pay

## 2019-12-09 ENCOUNTER — Encounter
Admission: RE | Admit: 2019-12-09 | Discharge: 2019-12-09 | Disposition: A | Discharge status: Home / Self Care | Payer: Medicaid Other | Source: Ambulatory Visit | Attending: Cardiovascular Disease | Admitting: Cardiovascular Disease

## 2019-12-09 DIAGNOSIS — R079 Chest pain, unspecified: Secondary | ICD-10-CM

## 2019-12-09 LAB — NM MYOCAR MULTI W/SPECT W/WALL MOTION / EF
LV dias vol: 281 mL (ref 62–150)
LV sys vol: 219 mL
Peak HR: 78 {beats}/min
Percent HR: 49 %
Rest HR: 61 {beats}/min
SDS: 2
SRS: 32
SSS: 27
TID: 1.18

## 2019-12-09 MED ORDER — TECHNETIUM TC 99M TETROFOSMIN IV KIT
32.3500 | PACK | Freq: Once | INTRAVENOUS | Status: AC | PRN
  Administered 2019-12-09: 32.35 via INTRAVENOUS

## 2019-12-09 MED ORDER — TECHNETIUM TC 99M TETROFOSMIN IV KIT
10.0120 | PACK | Freq: Once | INTRAVENOUS | Status: AC | PRN
  Administered 2019-12-09: 10.012 via INTRAVENOUS

## 2019-12-09 MED ORDER — REGADENOSON 0.4 MG/5ML IV SOLN
0.4000 mg | Freq: Once | INTRAVENOUS | Status: AC
  Administered 2019-12-09: 0.4 mg via INTRAVENOUS
  Filled 2019-12-09: qty 5

## 2020-01-07 ENCOUNTER — Ambulatory Visit (INDEPENDENT_AMBULATORY_CARE_PROVIDER_SITE_OTHER): Payer: Medicaid Other

## 2020-01-07 ENCOUNTER — Other Ambulatory Visit: Payer: Self-pay

## 2020-01-07 DIAGNOSIS — I739 Peripheral vascular disease, unspecified: Secondary | ICD-10-CM | POA: Diagnosis not present

## 2020-01-13 ENCOUNTER — Other Ambulatory Visit: Payer: Self-pay

## 2020-01-13 ENCOUNTER — Ambulatory Visit (INDEPENDENT_AMBULATORY_CARE_PROVIDER_SITE_OTHER): Payer: Medicaid Other | Admitting: Internal Medicine

## 2020-01-13 ENCOUNTER — Telehealth: Payer: Self-pay | Admitting: Internal Medicine

## 2020-01-13 ENCOUNTER — Encounter: Payer: Self-pay | Admitting: Internal Medicine

## 2020-01-13 VITALS — BP 102/62 | HR 67 | Ht 69.5 in | Wt 204.0 lb

## 2020-01-13 DIAGNOSIS — I255 Ischemic cardiomyopathy: Secondary | ICD-10-CM

## 2020-01-13 DIAGNOSIS — I5022 Chronic systolic (congestive) heart failure: Secondary | ICD-10-CM

## 2020-01-13 MED ORDER — APIXABAN 5 MG PO TABS: 5.0000 mg | ORAL_TABLET | Freq: Two times a day (BID) | ORAL | 11 refills | Status: DC

## 2020-01-13 NOTE — Patient Instructions (Addendum)
Medication Instructions:  - Your physician has recommended you make the following change in your medication:    1) Stop aspirin  2) Stop plavix  3) Start eliquis 5 mg- take 1 tablet by mouth twice daily   *If you need a refill on your cardiac medications before your next appointment, please call your pharmacy*   Lab Work: - none ordered  If you have labs (blood work) drawn today and your tests are completely normal, you will receive your results only by: [name] MyChart Message (if you have MyChart) OR . A paper copy in the mail If you have any lab test that is abnormal or we need to change your treatment, we will call you to review the results.   Testing/Procedures: - none ordered   Follow-Up: At [facility], you and your health needs are our priority.  As part of our continuing mission to provide you with exceptional heart care, we have created designated Provider Care Teams.  These Care Teams include your primary Cardiologist (physician) and Advanced Practice Providers (APPs -  Physician Assistants and Nurse Practitioners) who all work together to provide you with the care you need, when you need it.  We recommend signing up for the patient portal called "MyChart".  Sign up information is provided on this After Visit Summary.  MyChart is used to connect with patients for Virtual Visits (Telemedicine).  Patients are able to view lab/test results, encounter notes, upcoming appointments, etc.  Non-urgent messages can be sent to your provider as well.   To learn more about what you can do with MyChart, go to [email or web address].    Your next appointment:   1) 6 weeks with Dr. Kirke Corin APP  2) 1 year with Dr. Graciela Husbands  The format for your next appointment:   In Person  Provider:   As above   Other Instructions - Please call the office after discussion with the county/ state about a possible device upgrade [phone] Seta, RN (for Dr. Graciela Husbands)

## 2020-01-13 NOTE — Progress Notes (Signed)
ELECTROPHYSIOLOGY CONSULT NOTE  Patient ID: Carl [name], MRN: [id number], DOB/AGE: April 09, 1961 59 y.o. Admit date: (Not on file) Date of Consult: 01/13/2020  Primary Physician: [facility] Primary Cardiologist: Carl [name]     Carl [name] is a 59 y.o. male who is being seen today for the evaluation of ICD  at the request of Carl [name].    HPI Carl [name] is a 59 y.o. male seen to establish Medtronic ICD DUAL chamber follow-up as well as to be considered for CRT upgrade  He has a history of ischemic heart disease previously managed at [facility].  He underwent bypass surgery following MI in 2017 with mitral valve repair and closure of a PFO.  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 But he and the woman who came with him from [place] years described shortness of breath with modest activity, walking back and forth across the grounds.  Not able to tell whether he has nocturnal dyspnea. No edema. But some abdominal distention which seems to aggravate his dyspnea.  No known atrial fibrillation per him (see below)  Recently hospitalized for dyspnea ascribed to COPD exacerbation although BNP was 1200  He has a legal [facility]  DATE TEST EF   6/17 LHC    % LAD p-80, D1-80, CX m-80 RCA m >>DESx2  4/20 Echo   20-25% %   5/21 Echo  25-30%    Date Cr K Hgb  5/21 1.4<<1.02 4.5 14.2          Thromboembolic risk factors (, HTN-1, TIA/CVA-2, Vasc disease -1, CHF-1) for a CHADSVASc Score of .>=5   Past Medical History:  Diagnosis Date  . Acute pulmonary edema (HCC) 2017  . Acute respiratory failure with hypoxia (HCC) 2017  . Bipolar 1 disorder (HCC)   . CHF (congestive heart failure) (HCC)   . Chronic kidney disease   . COPD (chronic obstructive pulmonary disease) (HCC)   . Depression   . Hypertension   . Myocardial infarction (HCC)   . NSTEMI  (non-ST elevated myocardial infarction) (HCC)   . 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 Seizures (HCC)    childhood  . Stroke (HCC)   . Tobacco abuse       Surgical History:  Past Surgical History:  Procedure Laterality Date  . CORONARY ARTERY BYPASS GRAFT       Home Meds: Current Meds  Medication Sig  . acetaminophen (TYLENOL) 500 MG tablet Take 500 mg by mouth every 6 (six) hours as needed for mild pain.  [name] albuterol (VENTOLIN HFA) 108 (90 Base) MCG/ACT inhaler Inhale 2 puffs into the lungs every 6 (six) hours as needed for wheezing.  [name] aspirin EC 81 MG tablet Take 1 tablet (81 mg total) by mouth daily.  . carvedilol (COREG) 6.25 MG tablet Take 1 tablet (6.25 mg total) by mouth 2 (two) times daily.  . clopidogrel (PLAVIX) 75 MG tablet Take 1 tablet (75 mg total) by mouth daily.  . divalproex (DEPAKOTE ER) 500 MG 24 hr tablet Take 1 tablet (500 mg total) by mouth 2 (two) times daily.  [name] escitalopram (LEXAPRO) 10 MG tablet Take 1 tablet (10 mg total) by mouth at bedtime.  . Fluticasone-Salmeterol (ADVAIR DISKUS) 250-50 MCG/DOSE AEPB Inhale 1 puff into the lungs 2 (two) times daily.  . furosemide (LASIX) 20 MG tablet Take 1  tablet (20 mg total) by mouth daily as needed.  [name] levothyroxine (SYNTHROID) 25 MCG tablet Take 1 tablet (25 mcg total) by mouth daily.  [name] lisinopril (ZESTRIL) 2.5 MG tablet Take 1 tablet (2.5 mg total) by mouth daily.  . nicotine (NICODERM CQ - DOSED IN MG/24 HOURS) 21 mg/24hr patch Place 1 patch (21 mg total) onto the skin daily.  [name] omeprazole (PRILOSEC) 20 MG capsule Take 20 mg by mouth daily.  . polyethylene glycol powder (GLYCOLAX/MIRALAX) 17 GM/SCOOP powder Take 17 g (1 packet mixed in 4-8 oz of liquid)  by mouth daily as needed for moderate constipation.  . QUEtiapine (SEROQUEL) 100 MG tablet Take 100 mg by mouth at bedtime.  [name] QUETIAPINE FUMARATE ER PO Take 50 mg by mouth daily. Taking 1 tablet of 50MG  daily in the morning.  .  simvastatin (ZOCOR) 40 MG tablet Take 40 mg by mouth at bedtime.  tiotropium (SPIRIVA HANDIHALER) 18 MCG inhalation capsule Place 1 capsule (18 mcg total) into inhaler and inhale daily.    Allergies:  Allergies  Allergen Reactions  . Iodinated Diagnostic Agents Shortness Of Breath  . Penicillins Anaphylaxis and Shortness Of Breath    Respiratory  Tolerated cefuroxime on 01/06/16  . Strawberry Extract Anaphylaxis  . Cefepime Itching    Empiric antibiotic, developed pruritis.   . Erythromycin Itching  . Sulfa Antibiotics Itching, Nausea And Vomiting and Nausea Only    Social History   Socioeconomic History  . Marital status: Widowed    Spouse name: Not on file  . Number of children: Not on file  . Years of education: Not on file  . Highest education level: Not on file  Occupational History  . Not on file  Tobacco Use  . Smoking status: Former Smoker    Packs/day: 0.00    Years: 0.00    Pack years: 0.00  . Smokeless tobacco: Never Used  Substance and Sexual Activity  . Alcohol use: Not Currently  . Drug use: Never  . Sexual activity: Not Currently    Birth control/protection: Abstinence  Other Topics Concern  . Not on file  Social History Narrative  . Not on file   Social Determinants of Health   Financial Resource Strain:   . Difficulty of Paying Living Expenses:   Food Insecurity:   . Worried About 03/08/16 in the Last Year:   . Programme researcher, broadcasting/film/video in the Last Year:   Transportation Needs:   . Barista (Medical):   Freight forwarder Lack of Transportation (Non-Medical):   Physical Activity:   . Days of Exercise per Week:   . Minutes of Exercise per Session:   Stress:   . Feeling of Stress :   Social Connections:   . Frequency of Communication with Friends and Family:   . Frequency of Social Gatherings with Friends and Family:   . Attends Religious Services:   . Active Member of Clubs or Organizations:   . Attends [name] Meetings:   Banker  Marital Status:   Intimate Partner Violence:   . Fear of Current or Ex-Partner:   . Emotionally Abused:   [name] Physically Abused:   . Sexually Abused:      History reviewed. No pertinent family history.  Neg for cardiac disease  ROS:  Please see the history of present illness.     All other systems reviewed and negative.    Physical Exam:  Blood pressure 102/62, pulse 67, height 5' 9.5" (  1.765 m), weight 204 lb (92.5 kg), SpO2 98 %. General: Well developed, well nourished male in no acute distress. Head: Normocephalic, atraumatic, sclera non-icteric, no xanthomas, nares are without discharge. EENT: normal  Lymph Nodes:  none Neck: Negative for carotid bruits. JVD not elevated. Back:without scoliosis kyphosis  Lungs: Clear bilaterally to auscultation without wheezes, rales, or rhonchi. Breathing is unlabored. Heart: RRR with S1 S2. No murmur . No rubs, or gallops appreciated. Abdomen: Soft, non-tender, non-distended with normoactive bowel sounds. No hepatomegaly. No rebound/guarding. No obvious abdominal masses. Msk:  Strength and tone appear normal for age. Extremities: No clubbing or cyanosis. No edema.  Distal pedal pulses are 2+ and equal bilaterally. Skin: Warm and Dry Neuro: Alert and oriented X 3. CN III-XII intact Grossly normal sensory and motor function . Psych:  Responds to questions appropriately with a normal affect. Speech dysarthric and word selection poor      Labs: Cardiac Enzymes No results for input(s): CKTOTAL, CKMB, TROPONINI in the last 72 hours. CBC Lab Results  Component Value Date   WBC 13.3 (H) 11/22/2019   HGB 14.2 11/22/2019   HCT 39.9 11/22/2019   MCV 84.4 11/22/2019   PLT 240 11/22/2019   PROTIME: No results for input(s): LABPROT, INR in the last 72 hours. Chemistry No results for input(s): NA, K, CL, CO2, BUN, CREATININE, CALCIUM, PROT, BILITOT, ALKPHOS, ALT, AST, GLUCOSE in the last 168 hours.  Invalid input(s): LABALBU Lipids Lab Results   Component Value Date   CHOL 192 05/15/2019   HDL 46 05/15/2019   LDLCALC 90 05/15/2019   TRIG 281 (H) 05/15/2019   BNP No results found for: PROBNP Thyroid Function Tests: No results for input(s): TSH, T4TOTAL, T3FREE, THYROIDAB in the last 72 hours.  Invalid input(s): FREET3 Miscellaneous No results found for: DDIMER  Radiology/Studies:  VAS [place] LOWER EXT ART SEG MULTI (SEGMENTALS & LE RAYNAUDS)  Result Date: 01/08/2020 LOWER EXTREMITY DOPPLER STUDY Indications: "Spastic" muscle pain from lower back to knees. Intermittent and              unpredictable but ambulation usually exacerbates the symptoms. L>R.              Present for less than a year. Rest usually resolves pain but not              always. High Risk Factors: Hypertension, past history of smoking, prior MI, coronary                    artery disease, prior CVA.  Comparison Study: No previous report on record Performing Technologist: Quentin Ore RDMS, RVT, RDCS  Examination Guidelines: A complete evaluation includes at minimum, Doppler waveform signals and systolic blood pressure reading at the level of bilateral brachial, anterior tibial, and posterior tibial arteries, when vessel segments are accessible. Bilateral testing is considered an integral part of a complete examination. Photoelectric Plethysmograph (PPG) waveforms and toe systolic pressure readings are included as required and additional duplex testing as needed. Limited examinations for reoccurring indications may be performed as noted.  ABI Findings: +---------+------------------+-----+---------+--------+ Right    Rt Pressure (mmHg)IndexWaveform Comment  +---------+------------------+-----+---------+--------+ Brachial 126                    triphasic         +---------+------------------+-----+---------+--------+ ATA      140               1.11 triphasic         +---------+------------------+-----+---------+--------+  PTA      139               1.10  triphasic         +---------+------------------+-----+---------+--------+ PERO     141               1.12 triphasic         +---------+------------------+-----+---------+--------+ Great Toe95                0.75 Normal            +---------+------------------+-----+---------+--------+ +---------+------------------+-----+---------+-------+ Left     Lt Pressure (mmHg)IndexWaveform Comment +---------+------------------+-----+---------+-------+ Brachial 124                    triphasic        +---------+------------------+-----+---------+-------+ ATA      131               1.04 triphasic        +---------+------------------+-----+---------+-------+ PTA      141               1.12 triphasic        +---------+------------------+-----+---------+-------+ PERO     141               1.12 triphasic        +---------+------------------+-----+---------+-------+ Great Toe86                0.68 Normal           +---------+------------------+-----+---------+-------+ +-------+-----------+-----------+------------+------------+ ABI/TBIToday's ABIToday's TBIPrevious ABIPrevious TBI +-------+-----------+-----------+------------+------------+ Right  1.12       0.75                                +-------+-----------+-----------+------------+------------+ Left   1.12       0.68                                +-------+-----------+-----------+------------+------------+  Summary: Right: Resting right ankle-brachial index is within normal range. No evidence of significant right lower extremity arterial disease. The right toe-brachial index is normal. Left: Resting left ankle-brachial index is within normal range. No evidence of significant left lower extremity arterial disease. The left toe-brachial index is abnormal.  *See table(s) above for measurements and observations.  Electronically signed by Nanetta Batty MD on 01/08/2020 at 7:30:02 AM.    Final     EKG: Sinus  rhythm with left bundle branch block  QRS duration about 165 ms   Assessment and Plan:  Ischemic cardiomyopathy-'s s/p CABG plus mitral valve ring  Congestive heart failure-chronic-systolic-class IIb-IIIa  Implantable defibrillator-dual-chamber-Medtronic  Left bundle branch block  Atrial fibrillation-subclinical  Strokes-multiple  Bipolar disorder  Renal insufficiency acute/chronic    The patient has a dual-chamber defibrillator implanted according to him but not notes that I can corroborate for ventricular tachycardia  He has intercurrently developed left bundle branch block and notes from [facility] raise the possibility of CRT upgrade. His heart failure status including recent hospitalizations with support such an undertaking. We have discussed potential benefits and risks. Leadership from [place] [facility] was here and they will have to reach out to this county for consent  We have reviewed the benefits and risks of generator replacement.  These include but are not limited to lead fracture and infection as well as complications related to the placement including pneumothorax, cardiac perforation and lead dislodgment the  patient voices understanding  The patient is also found to have significant subclinical atrial fibrillation with episodes of at least 6 hours. Given his history of strokes, reasonable to transition from antiplatelet therapy to anticoagulation. Have reviewed this. We'll begin him on Eliquis 5 mg twice daily.  We'll plan to see him in 1 years time and earlier depending on consent for CRT upgrade     Sherryl MangesSteven Cesia Orf

## 2020-01-13 NOTE — Telephone Encounter (Signed)
Guardian from [facility] calling to discuss care.

## 2020-01-20 ENCOUNTER — Ambulatory Visit: Payer: Medicaid Other | Admitting: Nurse Practitioner

## 2020-01-24 ENCOUNTER — Other Ambulatory Visit: Payer: Self-pay

## 2020-01-24 ENCOUNTER — Inpatient Hospital Stay
Admit: 2020-01-24 | Discharge: 2020-01-24 | Disposition: A | Discharge status: Home / Self Care | Payer: Medicaid Other | Attending: Internal Medicine | Admitting: Internal Medicine

## 2020-01-24 ENCOUNTER — Emergency Department: Payer: Medicaid Other

## 2020-01-24 ENCOUNTER — Inpatient Hospital Stay
Admission: EM | Admit: 2020-01-24 | Discharge: 2020-01-27 | DRG: 871 | Disposition: A | Discharge status: Home / Self Care | Payer: Medicaid Other | Source: Skilled Nursing Facility | Attending: Internal Medicine | Admitting: Internal Medicine

## 2020-01-24 DIAGNOSIS — J440 Chronic obstructive pulmonary disease with acute lower respiratory infection: Secondary | ICD-10-CM | POA: Diagnosis present

## 2020-01-24 DIAGNOSIS — E785 Hyperlipidemia, unspecified: Secondary | ICD-10-CM | POA: Diagnosis present

## 2020-01-24 DIAGNOSIS — R569 Unspecified convulsions: Secondary | ICD-10-CM

## 2020-01-24 DIAGNOSIS — J441 Chronic obstructive pulmonary disease with (acute) exacerbation: Secondary | ICD-10-CM | POA: Diagnosis present

## 2020-01-24 DIAGNOSIS — Z87891 Personal history of nicotine dependence: Secondary | ICD-10-CM | POA: Diagnosis not present

## 2020-01-24 DIAGNOSIS — J9601 Acute respiratory failure with hypoxia: Secondary | ICD-10-CM | POA: Diagnosis present

## 2020-01-24 DIAGNOSIS — I5043 Acute on chronic combined systolic (congestive) and diastolic (congestive) heart failure: Secondary | ICD-10-CM | POA: Diagnosis present

## 2020-01-24 DIAGNOSIS — Z79890 Hormone replacement therapy (postmenopausal): Secondary | ICD-10-CM

## 2020-01-24 DIAGNOSIS — I959 Hypotension, unspecified: Secondary | ICD-10-CM | POA: Diagnosis present

## 2020-01-24 DIAGNOSIS — G40909 Epilepsy, unspecified, not intractable, without status epilepticus: Secondary | ICD-10-CM | POA: Diagnosis present

## 2020-01-24 DIAGNOSIS — N179 Acute kidney failure, unspecified: Secondary | ICD-10-CM | POA: Diagnosis present

## 2020-01-24 DIAGNOSIS — I25118 Atherosclerotic heart disease of native coronary artery with other forms of angina pectoris: Secondary | ICD-10-CM | POA: Diagnosis not present

## 2020-01-24 DIAGNOSIS — I10 Essential (primary) hypertension: Secondary | ICD-10-CM

## 2020-01-24 DIAGNOSIS — I2510 Atherosclerotic heart disease of native coronary artery without angina pectoris: Secondary | ICD-10-CM | POA: Diagnosis present

## 2020-01-24 DIAGNOSIS — Z8673 Personal history of transient ischemic attack (TIA), and cerebral infarction without residual deficits: Secondary | ICD-10-CM | POA: Diagnosis not present

## 2020-01-24 DIAGNOSIS — A419 Sepsis, unspecified organism: Principal | ICD-10-CM | POA: Diagnosis present

## 2020-01-24 DIAGNOSIS — I130 Hypertensive heart and chronic kidney disease with heart failure and stage 1 through stage 4 chronic kidney disease, or unspecified chronic kidney disease: Secondary | ICD-10-CM | POA: Diagnosis present

## 2020-01-24 DIAGNOSIS — J189 Pneumonia, unspecified organism: Secondary | ICD-10-CM | POA: Diagnosis present

## 2020-01-24 DIAGNOSIS — Z7951 Long term (current) use of inhaled steroids: Secondary | ICD-10-CM | POA: Diagnosis not present

## 2020-01-24 DIAGNOSIS — I252 Old myocardial infarction: Secondary | ICD-10-CM

## 2020-01-24 DIAGNOSIS — N1831 Chronic kidney disease, stage 3a: Secondary | ICD-10-CM | POA: Diagnosis present

## 2020-01-24 DIAGNOSIS — I5023 Acute on chronic systolic (congestive) heart failure: Secondary | ICD-10-CM | POA: Diagnosis not present

## 2020-01-24 DIAGNOSIS — E875 Hyperkalemia: Secondary | ICD-10-CM | POA: Diagnosis present

## 2020-01-24 DIAGNOSIS — Z95810 Presence of automatic (implantable) cardiac defibrillator: Secondary | ICD-10-CM

## 2020-01-24 DIAGNOSIS — Z6830 Body mass index (BMI) 30.0-30.9, adult: Secondary | ICD-10-CM

## 2020-01-24 DIAGNOSIS — Z7901 Long term (current) use of anticoagulants: Secondary | ICD-10-CM

## 2020-01-24 DIAGNOSIS — I509 Heart failure, unspecified: Secondary | ICD-10-CM

## 2020-01-24 DIAGNOSIS — Z79899 Other long term (current) drug therapy: Secondary | ICD-10-CM | POA: Diagnosis not present

## 2020-01-24 DIAGNOSIS — Z20822 Contact with and (suspected) exposure to covid-19: Secondary | ICD-10-CM | POA: Diagnosis present

## 2020-01-24 DIAGNOSIS — F319 Bipolar disorder, unspecified: Secondary | ICD-10-CM | POA: Diagnosis present

## 2020-01-24 DIAGNOSIS — E039 Hypothyroidism, unspecified: Secondary | ICD-10-CM | POA: Diagnosis present

## 2020-01-24 DIAGNOSIS — J9621 Acute and chronic respiratory failure with hypoxia: Secondary | ICD-10-CM | POA: Diagnosis present

## 2020-01-24 DIAGNOSIS — E669 Obesity, unspecified: Secondary | ICD-10-CM | POA: Diagnosis present

## 2020-01-24 DIAGNOSIS — Z951 Presence of aortocoronary bypass graft: Secondary | ICD-10-CM

## 2020-01-24 DIAGNOSIS — Z87898 Personal history of other specified conditions: Secondary | ICD-10-CM

## 2020-01-24 LAB — PROCALCITONIN: Procalcitonin: <0.1 ng/mL

## 2020-01-24 LAB — COMPREHENSIVE METABOLIC PANEL
ALT: 24 U/L (ref 0–44)
AST: 35 U/L (ref 15–41)
Albumin: 3.7 g/dL (ref 3.5–5.0)
Alkaline Phosphatase: 102 U/L (ref 38–126)
Anion gap: 10 (ref 5–15)
BUN: 23 mg/dL (ref 8–23)
CO2: 22 mmol/L (ref 22–32)
Calcium: 8.6 mg/dL — ABNORMAL LOW (ref 8.9–10.3)
Chloride: 104 mmol/L (ref 98–111)
Creatinine, Ser: 1.68 mg/dL — ABNORMAL HIGH (ref 0.61–1.24)
GFR calc Af Amer: 50 mL/min — ABNORMAL LOW (ref >60)
GFR calc non Af Amer: 43 mL/min — ABNORMAL LOW (ref >60)
Glucose, Bld: 168 mg/dL — ABNORMAL HIGH (ref 70–99)
Potassium: 5.2 mmol/L — ABNORMAL HIGH (ref 3.5–5.1)
Sodium: 136 mmol/L (ref 135–145)
Total Bilirubin: 0.7 mg/dL (ref 0.3–1.2)
Total Protein: 7.6 g/dL (ref 6.5–8.1)

## 2020-01-24 LAB — ECHOCARDIOGRAM COMPLETE
AR max vel: 2.86 cm2
AV Area VTI: 2.83 cm2
AV Area mean vel: 2.42 cm2
AV Mean grad: 4 mmHg
AV Peak grad: 7.1 mmHg
Ao pk vel: 1.33 m/s
Area-P 1/2: 3.27 cm2
Calc EF: 29.4 %
Height: 69 in
S' Lateral: 5.44 cm
Single Plane A2C EF: 27.6 %
Single Plane A4C EF: 30.3 %
Weight: 3284.8 oz

## 2020-01-24 LAB — MRSA PCR SCREENING: MRSA by PCR: NEGATIVE

## 2020-01-24 LAB — EXPECTORATED SPUTUM ASSESSMENT W GRAM STAIN, RFLX TO RESP C

## 2020-01-24 LAB — CBC WITH DIFFERENTIAL/PLATELET
Abs Immature Granulocytes: 0.08 10*3/uL — ABNORMAL HIGH (ref 0.00–0.07)
Basophils Absolute: 0.1 10*3/uL (ref 0.0–0.1)
Basophils Relative: 1 %
Eosinophils Absolute: 0.3 10*3/uL (ref 0.0–0.5)
Eosinophils Relative: 2 %
HCT: 39.1 % (ref 39.0–52.0)
Hemoglobin: 13 g/dL (ref 13.0–17.0)
Immature Granulocytes: 1 %
Lymphocytes Relative: 9 %
Lymphs Abs: 1 10*3/uL (ref 0.7–4.0)
MCH: 29.8 pg (ref 26.0–34.0)
MCHC: 33.2 g/dL (ref 30.0–36.0)
MCV: 89.7 fL (ref 80.0–100.0)
Monocytes Absolute: 0.9 10*3/uL (ref 0.1–1.0)
Monocytes Relative: 7 %
Neutro Abs: 9.5 10*3/uL — ABNORMAL HIGH (ref 1.7–7.7)
Neutrophils Relative %: 80 %
Platelets: 249 10*3/uL (ref 150–400)
RBC: 4.36 MIL/uL (ref 4.22–5.81)
RDW: 16.3 % — ABNORMAL HIGH (ref 11.5–15.5)
WBC: 11.8 10*3/uL — ABNORMAL HIGH (ref 4.0–10.5)
nRBC: 0 % (ref 0.0–0.2)

## 2020-01-24 LAB — BLOOD GAS, VENOUS
Acid-base deficit: 2.1 mmol/L — ABNORMAL HIGH (ref 0.0–2.0)
Bicarbonate: 22.4 mmol/L (ref 20.0–28.0)
O2 Saturation: 75.7 %
Patient temperature: 37
pCO2, Ven: 37 mmHg — ABNORMAL LOW (ref 44.0–60.0)
pH, Ven: 7.39 (ref 7.250–7.430)
pO2, Ven: 41 mmHg (ref 32.0–45.0)

## 2020-01-24 LAB — SARS CORONAVIRUS 2 BY RT PCR (HOSPITAL ORDER, PERFORMED IN ~~LOC~~ HOSPITAL LAB): SARS Coronavirus 2: NEGATIVE

## 2020-01-24 LAB — BRAIN NATRIURETIC PEPTIDE: B Natriuretic Peptide: 1089.7 pg/mL — ABNORMAL HIGH (ref 0.0–100.0)

## 2020-01-24 LAB — LACTIC ACID, PLASMA: Lactic Acid, Venous: 2.5 mmol/L (ref 0.5–1.9)

## 2020-01-24 MED ORDER — FUROSEMIDE 10 MG/ML IJ SOLN
40.0000 mg | Freq: Once | INTRAMUSCULAR | Status: AC
  Administered 2020-01-24: 40 mg via INTRAVENOUS
  Filled 2020-01-24: qty 4

## 2020-01-24 MED ORDER — QUETIAPINE FUMARATE 25 MG PO TABS
50.0000 mg | ORAL_TABLET | Freq: Every morning | ORAL | Status: DC
  Administered 2020-01-25 – 2020-01-27 (×3): 50 mg via ORAL
  Filled 2020-01-24 (×3): qty 2

## 2020-01-24 MED ORDER — ESCITALOPRAM OXALATE 10 MG PO TABS
10.0000 mg | ORAL_TABLET | Freq: Every day | ORAL | Status: DC
  Administered 2020-01-24 – 2020-01-26 (×3): 10 mg via ORAL
  Filled 2020-01-24 (×4): qty 1

## 2020-01-24 MED ORDER — LORAZEPAM 2 MG/ML IJ SOLN: 0.5000 mg | INTRAMUSCULAR | Status: DC | PRN

## 2020-01-24 MED ORDER — ALBUTEROL SULFATE (2.5 MG/3ML) 0.083% IN NEBU: 5.0000 mg | INHALATION_SOLUTION | Freq: Once | RESPIRATORY_TRACT | Status: AC

## 2020-01-24 MED ORDER — MOMETASONE FURO-FORMOTEROL FUM 200-5 MCG/ACT IN AERO
2.0000 | INHALATION_SPRAY | Freq: Two times a day (BID) | RESPIRATORY_TRACT | Status: DC
  Administered 2020-01-24 – 2020-01-27 (×6): 2 via RESPIRATORY_TRACT
  Filled 2020-01-24: qty 8.8

## 2020-01-24 MED ORDER — QUETIAPINE FUMARATE 25 MG PO TABS
100.0000 mg | ORAL_TABLET | Freq: Every day | ORAL | Status: DC
  Administered 2020-01-24 – 2020-01-26 (×3): 100 mg via ORAL
  Filled 2020-01-24 (×3): qty 4

## 2020-01-24 MED ORDER — IPRATROPIUM-ALBUTEROL 0.5-2.5 (3) MG/3ML IN SOLN
3.0000 mL | RESPIRATORY_TRACT | Status: DC
  Administered 2020-01-24 – 2020-01-25 (×5): 3 mL via RESPIRATORY_TRACT
  Filled 2020-01-24 (×5): qty 3

## 2020-01-24 MED ORDER — DIVALPROEX SODIUM ER 500 MG PO TB24
500.0000 mg | ORAL_TABLET | Freq: Two times a day (BID) | ORAL | Status: DC
  Administered 2020-01-24 – 2020-01-27 (×6): 500 mg via ORAL
  Filled 2020-01-24 (×8): qty 1

## 2020-01-24 MED ORDER — PANTOPRAZOLE SODIUM 40 MG PO TBEC
40.0000 mg | DELAYED_RELEASE_TABLET | Freq: Every day | ORAL | Status: DC
  Administered 2020-01-25 – 2020-01-27 (×3): 40 mg via ORAL
  Filled 2020-01-24 (×3): qty 1

## 2020-01-24 MED ORDER — VANCOMYCIN HCL IN DEXTROSE 1-5 GM/200ML-% IV SOLN: 1000.0000 mg | Freq: Once | INTRAVENOUS | Status: DC

## 2020-01-24 MED ORDER — IPRATROPIUM-ALBUTEROL 0.5-2.5 (3) MG/3ML IN SOLN
3.0000 mL | Freq: Once | RESPIRATORY_TRACT | Status: AC
  Administered 2020-01-24: 3 mL via RESPIRATORY_TRACT
  Filled 2020-01-24: qty 3

## 2020-01-24 MED ORDER — APIXABAN 5 MG PO TABS
5.0000 mg | ORAL_TABLET | Freq: Two times a day (BID) | ORAL | Status: DC
  Administered 2020-01-24 – 2020-01-27 (×6): 5 mg via ORAL
  Filled 2020-01-24 (×6): qty 1

## 2020-01-24 MED ORDER — POLYETHYLENE GLYCOL 3350 17 G PO PACK
17.0000 g | PACK | Freq: Every day | ORAL | Status: DC | PRN
  Administered 2020-01-24: 17 g via ORAL
  Filled 2020-01-24: qty 1

## 2020-01-24 MED ORDER — METHYLPREDNISOLONE SODIUM SUCC 125 MG IJ SOLR
125.0000 mg | Freq: Once | INTRAMUSCULAR | Status: AC
  Administered 2020-01-24: 125 mg via INTRAVENOUS
  Filled 2020-01-24: qty 2

## 2020-01-24 MED ORDER — ALBUTEROL SULFATE HFA 108 (90 BASE) MCG/ACT IN AERS
2.0000 | INHALATION_SPRAY | RESPIRATORY_TRACT | Status: DC | PRN
  Filled 2020-01-24: qty 6.7

## 2020-01-24 MED ORDER — METHYLPREDNISOLONE SODIUM SUCC 40 MG IJ SOLR
40.0000 mg | Freq: Two times a day (BID) | INTRAMUSCULAR | Status: DC
  Administered 2020-01-24 – 2020-01-25 (×2): 40 mg via INTRAVENOUS
  Filled 2020-01-24 (×2): qty 1

## 2020-01-24 MED ORDER — POLYETHYLENE GLYCOL 3350 17 GM/SCOOP PO POWD
1.0000 | Freq: Every day | ORAL | Status: DC | PRN
  Filled 2020-01-24: qty 255

## 2020-01-24 MED ORDER — SODIUM CHLORIDE 0.9 % IV SOLN
2.0000 g | Freq: Once | INTRAVENOUS | Status: AC
  Administered 2020-01-24: 2 g via INTRAVENOUS
  Filled 2020-01-24: qty 2

## 2020-01-24 MED ORDER — DOXYCYCLINE HYCLATE 100 MG PO TABS
100.0000 mg | ORAL_TABLET | Freq: Two times a day (BID) | ORAL | Status: DC
  Administered 2020-01-24 – 2020-01-25 (×2): 100 mg via ORAL
  Filled 2020-01-24 (×2): qty 1

## 2020-01-24 MED ORDER — SODIUM CHLORIDE 0.9 % IV SOLN: 250.0000 mL | INTRAVENOUS | Status: DC | PRN

## 2020-01-24 MED ORDER — LEVOTHYROXINE SODIUM 25 MCG PO TABS
25.0000 ug | ORAL_TABLET | Freq: Every day | ORAL | Status: DC
  Administered 2020-01-25 – 2020-01-27 (×3): 25 ug via ORAL
  Filled 2020-01-24 (×3): qty 1

## 2020-01-24 MED ORDER — FUROSEMIDE 10 MG/ML IJ SOLN
40.0000 mg | Freq: Two times a day (BID) | INTRAMUSCULAR | Status: DC
  Administered 2020-01-24 – 2020-01-27 (×5): 40 mg via INTRAVENOUS
  Filled 2020-01-24 (×5): qty 4

## 2020-01-24 MED ORDER — VANCOMYCIN HCL 2000 MG/400ML IV SOLN
2000.0000 mg | Freq: Once | INTRAVENOUS | Status: AC
  Administered 2020-01-24: 2000 mg via INTRAVENOUS
  Filled 2020-01-24: qty 400

## 2020-01-24 MED ORDER — IPRATROPIUM-ALBUTEROL 0.5-2.5 (3) MG/3ML IN SOLN
3.0000 mL | Freq: Once | RESPIRATORY_TRACT | Status: AC
  Administered 2020-01-24: 3 mL via RESPIRATORY_TRACT

## 2020-01-24 MED ORDER — SODIUM CHLORIDE 0.9% FLUSH
3.0000 mL | Freq: Two times a day (BID) | INTRAVENOUS | Status: DC
  Administered 2020-01-24 – 2020-01-27 (×6): 3 mL via INTRAVENOUS

## 2020-01-24 MED ORDER — SODIUM CHLORIDE 0.9% FLUSH: 3.0000 mL | INTRAVENOUS | Status: DC | PRN

## 2020-01-24 MED ORDER — CARVEDILOL 6.25 MG PO TABS
6.2500 mg | ORAL_TABLET | Freq: Two times a day (BID) | ORAL | Status: DC
  Administered 2020-01-24 – 2020-01-27 (×6): 6.25 mg via ORAL
  Filled 2020-01-24 (×6): qty 1

## 2020-01-24 MED ORDER — NICOTINE 21 MG/24HR TD PT24
21.0000 mg | MEDICATED_PATCH | Freq: Every day | TRANSDERMAL | Status: DC
  Administered 2020-01-26: 21 mg via TRANSDERMAL
  Filled 2020-01-24 (×2): qty 1

## 2020-01-24 MED ORDER — ACETAMINOPHEN 325 MG PO TABS: 650.0000 mg | ORAL_TABLET | ORAL | Status: DC | PRN

## 2020-01-24 MED ORDER — SIMVASTATIN 20 MG PO TABS
40.0000 mg | ORAL_TABLET | Freq: Every day | ORAL | Status: DC
  Administered 2020-01-24 – 2020-01-26 (×3): 40 mg via ORAL
  Filled 2020-01-24 (×3): qty 2

## 2020-01-24 MED ORDER — DM-GUAIFENESIN ER 30-600 MG PO TB12: 1.0000 | ORAL_TABLET | Freq: Two times a day (BID) | ORAL | Status: DC | PRN

## 2020-01-24 NOTE — ED Notes (Signed)
Grey tube sent to lab

## 2020-01-24 NOTE — ED Provider Notes (Addendum)
[facility] Emergency Department Provider Note    First MD Initiated Contact with Patient 01/24/20 [phone]     (approximate)  I have reviewed the triage vital signs and the nursing notes.   HISTORY  Chief Complaint Shortness of Breath    HPI Carl [name] is a 59 y.o. male the below listed past medical history presents to the ER for worsening shortness of breath wheezing congestion for the past 24 hours.  Denies any chest pain.  Has history of COPD as well as CHF.  States has been compliant with his Lasix.  Was given an albuterol inhaler in route via EMS with some improvement.  Denies any productive cough.  No recent fevers.  Does not wear home oxygen.    Past Medical History:  Diagnosis Date  . Acute pulmonary edema (HCC) 2017  . Acute respiratory failure with hypoxia (HCC) 2017  . Bipolar 1 disorder (HCC)   . CHF (congestive heart failure) (HCC)   . Chronic kidney disease   . COPD (chronic obstructive pulmonary disease) (HCC)   . Depression   . Hypertension   . Myocardial infarction (HCC)   . NSTEMI (non-ST elevated myocardial infarction) (HCC)   . 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 Seizures (HCC)    childhood  . Stroke (HCC)   . Tobacco abuse    Family History  Problem Relation Age of Onset  . Cancer Mother        "femal cancer"  . Prostate cancer Father    Past Surgical History:  Procedure Laterality Date  . CORONARY ARTERY BYPASS GRAFT     Patient Active Problem List   Diagnosis Date Noted  . HCAP (healthcare-associated pneumonia) 01/24/2020  . Hyperkalemia 01/24/2020  . Acute on chronic systolic (congestive) heart failure (HCC) 01/24/2020  . Seizure (HCC) 01/24/2020  . HLD (hyperlipidemia) 01/24/2020  . Ascites due to alcoholic cirrhosis (HCC)   . Acute on chronic systolic CHF (congestive heart failure) (HCC) 11/18/2019  . 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 Elevated  troponin 11/18/2019  . Hypoxia 11/18/2019  . Acute renal failure superimposed on stage 3a chronic kidney disease (HCC) 11/18/2019  . Coronary artery disease 05/15/2019  . Essential hypertension 05/15/2019  . History of stroke 05/15/2019  . Homicidal ideation 05/15/2019  . Hypothyroidism 05/15/2019  . COPD exacerbation (HCC) 03/22/2019  . Bipolar 1 disorder (HCC) 12/23/2018  . Acute on chronic respiratory failure with hypoxia (HCC) 11/03/2018      Prior to Admission medications   Medication Sig Start Date End Date Taking? Authorizing Provider  acetaminophen (TYLENOL) 500 MG tablet Take 500 mg by mouth every 6 (six) hours as needed for mild pain.   Yes [provider]  albuterol (VENTOLIN HFA) 108 (90 Base) MCG/ACT inhaler Inhale 2 puffs into the lungs every 6 (six) hours as needed for wheezing. 05/24/19 01/24/20 Yes Clapacs, Jackquline Denmark, MD  apixaban (ELIQUIS) 5 MG TABS tablet Take 1 tablet (5 mg total) by mouth 2 (two) times daily. 01/13/20  Yes Duke Salvia, MD  carvedilol (COREG) 6.25 MG tablet Take 1 tablet (6.25 mg total) by mouth 2 (two) times daily. 05/24/19 01/24/20 Yes Clapacs, Jackquline Denmark, MD  divalproex (DEPAKOTE ER) 500 MG 24 hr tablet Take 1 tablet (500 mg total) by mouth 2 (two) times daily. 05/24/19  Yes Clapacs, Jackquline Denmark, MD  escitalopram (LEXAPRO) 10 MG tablet Take 1 tablet (10 mg total) by mouth at bedtime. 05/24/19  Yes Clapacs,  Jackquline Denmark, MD  Fluticasone-Salmeterol (ADVAIR DISKUS) 250-50 MCG/DOSE AEPB Inhale 1 puff into the lungs 2 (two) times daily. 11/22/19 01/24/20 Yes Delfino Lovett, MD  furosemide (LASIX) 20 MG tablet Take 1 tablet (20 mg total) by mouth daily as needed. 11/22/19 11/21/20 Yes Delfino Lovett, MD  levothyroxine (SYNTHROID) 25 MCG tablet Take 1 tablet (25 mcg total) by mouth daily. 05/24/19 01/24/20 Yes Clapacs, Jackquline Denmark, MD  lisinopril (ZESTRIL) 2.5 MG tablet Take 1 tablet (2.5 mg total) by mouth daily. 05/24/19 01/24/20 Yes Clapacs, Jackquline Denmark, MD  nicotine (NICODERM CQ -  DOSED IN MG/24 HOURS) 21 mg/24hr patch Place 1 patch (21 mg total) onto the skin daily. 11/23/19  Yes Delfino Lovett, MD  omeprazole (PRILOSEC) 20 MG capsule Take 20 mg by mouth daily.   Yes [provider]  polyethylene glycol powder (GLYCOLAX/MIRALAX) 17 GM/SCOOP powder Take 17 g (1 packet mixed in 4-8 oz of liquid)  by mouth daily as needed for moderate constipation. 03/23/19  Yes [provider]  QUEtiapine (SEROQUEL) 100 MG tablet Take 100 mg by mouth at bedtime.   Yes [provider]  QUEtiapine (SEROQUEL) 50 MG tablet Take 50 mg by mouth in the morning.   Yes [provider]  simvastatin (ZOCOR) 40 MG tablet Take 40 mg by mouth at bedtime.   Yes [provider]  tiotropium (SPIRIVA HANDIHALER) 18 MCG inhalation capsule Place 1 capsule (18 mcg total) into inhaler and inhale daily. 11/22/19 01/24/20 Yes Delfino Lovett, MD    Allergies Iodinated diagnostic agents, Penicillins, Strawberry extract, Cefepime, Erythromycin, and Sulfa antibiotics    Social History Social History   Tobacco Use  . Smoking status: Former Smoker    Packs/day: 0.00    Years: 0.00    Pack years: 0.00  . Smokeless tobacco: Never Used  Substance Use Topics  . Alcohol use: Not Currently  . Drug use: Never    Review of Systems Patient denies headaches, rhinorrhea, blurry vision, numbness, shortness of breath, chest pain, edema, cough, abdominal pain, nausea, vomiting, diarrhea, dysuria, fevers, rashes or hallucinations unless otherwise stated above in HPI. ____________________________________________   PHYSICAL EXAM:  VITAL SIGNS: Vitals:   01/24/20 1200 01/24/20 1300  BP: 123/75 131/73  Pulse: 69 71  Resp: 15 (!) 24  Temp:    SpO2: 100% 99%    Constitutional: Alert and oriented.  Eyes: Conjunctivae are normal.  Head: Atraumatic. Nose: No congestion/rhinnorhea. Mouth/Throat: Mucous membranes are moist.   Neck: No stridor. Painless ROM.  Cardiovascular: Normal  rate, regular rhythm. Grossly normal heart sounds.  Good peripheral circulation. Respiratory:tachypnea with diffuse inspiratory and expiratory wheeze, speaking in short phrases Gastrointestinal: Soft and nontender. No distention. No abdominal bruits. No CVA tenderness. Genitourinary:  Musculoskeletal: No lower extremity tenderness, 2+ BLE edema.  No joint effusions. Neurologic:  Normal speech and language. No gross focal neurologic deficits are appreciated. No facial droop Skin:  Skin is warm, dry and intact. No rash noted. Psychiatric: Mood and affect are normal. Speech and behavior are normal.  ____________________________________________   LABS (all labs ordered are listed, but only abnormal results are displayed)  Results for orders placed or performed during the hospital encounter of 01/24/20 (from the past 24 hour(s))  CBC with Differential/Platelet     Status: Abnormal   Collection Time: 01/24/20  7:53 AM  Result Value Ref Range   WBC 11.8 (H) 4.0 - 10.5 K/uL   RBC 4.36 4.22 - 5.81 MIL/uL   Hemoglobin 13.0 13.0 - 17.0 g/dL  HCT 39.1 39 - 52 %   MCV 89.7 80.0 - 100.0 fL   MCH 29.8 26.0 - 34.0 pg   MCHC 33.2 30.0 - 36.0 g/dL   RDW 16.116.3 (H) 09.611.5 - 04.515.5 %   Platelets 249 150 - 400 K/uL   nRBC 0.0 0.0 - 0.2 %   Neutrophils Relative % 80 %   Neutro Abs 9.5 (H) 1.7 - 7.7 K/uL   Lymphocytes Relative 9 %   Lymphs Abs 1.0 0.7 - 4.0 K/uL   Monocytes Relative 7 %   Monocytes Absolute 0.9 0 - 1 K/uL   Eosinophils Relative 2 %   Eosinophils Absolute 0.3 0 - 0 K/uL   Basophils Relative 1 %   Basophils Absolute 0.1 0 - 0 K/uL   Immature Granulocytes 1 %   Abs Immature Granulocytes 0.08 (H) 0.00 - 0.07 K/uL  Comprehensive metabolic panel     Status: Abnormal   Collection Time: 01/24/20  7:53 AM  Result Value Ref Range   Sodium 136 135 - 145 mmol/L   Potassium 5.2 (H) 3.5 - 5.1 mmol/L   Chloride 104 98 - 111 mmol/L   CO2 22 22 - 32 mmol/L   Glucose, Bld 168 (H) 70 - 99 mg/dL    BUN 23 8 - 23 mg/dL   Creatinine, Ser 4.091.68 (H) 0.61 - 1.24 mg/dL   Calcium 8.6 (L) 8.9 - 10.3 mg/dL   Total Protein 7.6 6.5 - 8.1 g/dL   Albumin 3.7 3.5 - 5.0 g/dL   AST 35 15 - 41 U/L   ALT 24 0 - 44 U/L   Alkaline Phosphatase 102 38 - 126 U/L   Total Bilirubin 0.7 0.3 - 1.2 mg/dL   GFR calc non Af Amer 43 (L) >60 mL/min   GFR calc Af Amer 50 (L) >60 mL/min   Anion gap 10 5 - 15  Brain natriuretic peptide     Status: Abnormal   Collection Time: 01/24/20  7:53 AM  Result Value Ref Range   B Natriuretic Peptide 1,089.7 (H) 0.0 - 100.0 pg/mL  Procalcitonin     Status: None   Collection Time: 01/24/20  7:53 AM  Result Value Ref Range   Procalcitonin <0.10 ng/mL  Blood gas, venous     Status: Abnormal   Collection Time: 01/24/20  9:01 AM  Result Value Ref Range   pH, Ven 7.39 7.25 - 7.43   pCO2, Ven 37 (L) 44 - 60 mmHg   pO2, Ven 41.0 32 - 45 mmHg   Bicarbonate 22.4 20.0 - 28.0 mmol/L   Acid-base deficit 2.1 (H) 0.0 - 2.0 mmol/L   O2 Saturation 75.7 %   Patient temperature 37.0    Collection site VEIN    Sample type VENOUS   SARS Coronavirus 2 by RT PCR (hospital order, performed in [facility] lab) Nasopharyngeal Nasopharyngeal Swab     Status: None   Collection Time: 01/24/20  9:27 AM   Specimen: Nasopharyngeal Swab  Result Value Ref Range   SARS Coronavirus 2 NEGATIVE NEGATIVE  Lactic acid, plasma     Status: Abnormal   Collection Time: 01/24/20 11:07 AM  Result Value Ref Range   Lactic Acid, Venous 2.5 (HH) 0.5 - 1.9 mmol/L   ____________________________________________  EKG My review and personal interpretation at Time: 7:53   Indication: sob  Rate: 90  Rhythm: sinus Axis: left Other: Ivcd, abnml ekg ____________________________________________  RADIOLOGY I personally reviewed all radiographic images ordered to evaluate for the  above acute complaints and reviewed radiology reports and findings.  These findings were personally discussed with the patient.   Please see medical record for radiology report.   ____________________________________________   PROCEDURES  Procedure(s) performed:  .Critical Care Performed by: Willy Eddy, MD Authorized by: Willy Eddy, MD   Critical care provider statement:    Critical care time (minutes):  15   Critical care time was exclusive of:  Separately billable procedures and treating other patients   Critical care was necessary to treat or prevent imminent or life-threatening deterioration of the following conditions:  Respiratory failure   Critical care was time spent personally by me on the following activities:  Development of treatment plan with patient or surrogate, discussions with consultants, evaluation of patient's response to treatment, examination of patient, obtaining history from patient or surrogate, ordering and performing treatments and interventions, ordering and review of laboratory studies, ordering and review of radiographic studies, pulse oximetry, re-evaluation of patient's condition and review of old charts      Critical Care performed: yes ____________________________________________   INITIAL IMPRESSION / ASSESSMENT AND PLAN / ED COURSE  Pertinent labs & imaging results that were available during my care of the patient were reviewed by me and considered in my medical decision making (see chart for details).   DDX: Asthma, copd, CHF, pna, ptx, malignancy, Pe, anemia   Braycen Gorin is a 59 y.o. who presents to the ED with respiratory symptoms as described above.  Patient is tachypneic speaking short phrases.  Does have diffuse wheezing and feeling some improvement on after receiving nebulizers.  We will continue the same also has a history of CHF.  Does feel is having some chills and has some productive cough but not febrile here.  Clinical Course as of Jan 24 1316  Mon Jan 24, 2020  0907 Patient does seem to have had some improvement after nebs.   [PR]  1120  Patient with some significant improvement with nebs.  Will also give dose of Lasix.  Patient received dose of antibiotics due to concern for pneumonia but is not having other signs or infectious markers.  May simply be edema.  Nevertheless given his respiratory symptoms patient will require hospitalization for further work-up and medical management.  Will discuss with hospitalist.   [PR]  1137 Lactate is elevated which I suspect is more related to demand ischemia in setting of his shortness of breath and increased work of breathing.  When I went to evaluate him just now he does feel improved after nebs and current therapies.  Does not have any other markers to suggest sepsis.   [PR]    Clinical Course User Index [PR] Willy Eddy, MD    The patient was evaluated in Emergency Department today for the symptoms described in the history of present illness. He/she was evaluated in the context of the global COVID-19 pandemic, which necessitated consideration that the patient might be at risk for infection with the SARS-CoV-2 virus that causes COVID-19. Institutional protocols and algorithms that pertain to the evaluation of patients at risk for COVID-19 are in a state of rapid change based on information released by regulatory bodies including the CDC and federal and state organizations. These policies and algorithms were followed during the patient's care in the ED.  As part of my medical decision making, I reviewed the following data within the electronic MEDICAL RECORD NUMBER Nursing notes reviewed and incorporated, Labs reviewed, notes from prior ED visits and [place] Controlled Substance Database  ____________________________________________   FINAL CLINICAL IMPRESSION(S) / ED DIAGNOSES  Final diagnoses:  COPD exacerbation (HCC)  Acute on chronic congestive heart failure, unspecified heart failure type (HCC)      NEW MEDICATIONS STARTED DURING THIS VISIT:  New Prescriptions   No medications  on file     Note:  This document was prepared using Dragon voice recognition software and may include unintentional dictation errors.    Willy Eddy, MD 01/24/20 1122    Willy Eddy, MD 01/24/20 [phone]

## 2020-01-24 NOTE — ED Triage Notes (Signed)
Pt arrives via [facility] from [facility] for reports of shob that started this morning. Pt with audible wheezing and difficulty speaking in complete sentences without shob. Pt A&Ox4, skin warm and dry. Pt got 5 of albuterol in route. Pt reports legs are swollen with hx CHF.

## 2020-01-24 NOTE — Progress Notes (Signed)
Pt is refusing the bed alarm. Pt educated on safety precautions and verbalizes understanding. Pt states he "will call for help before he gets up but does not want the bed alarm on." Pt agreeable to seizure precautions bed rail pads. Yellow socks applied to pt and call bell is within reach. Will continue to monitor.

## 2020-01-24 NOTE — ED Notes (Signed)
Light green, lavender, red and blue tubes sent to lab

## 2020-01-24 NOTE — ED Notes (Signed)
Dr. Robinson informed of critical lactic 

## 2020-01-24 NOTE — ED Notes (Signed)
Pt placed on 2L [place] for O2 90% on RA

## 2020-01-24 NOTE — H&P (Signed)
History and Physical    Yuval Rubens YQI:[id number] DOB: 1961/03/31 DOA: 01/24/2020  Referring MD/NP/PA:   PCP: [facility]   Patient coming from:  The patient is coming from ALF.  At baseline, pt is dependent for most of ADL.        Chief Complaint: SOB   HPI: Carl [name] is a 59 y.o. male with medical history significant of hypertension, hyperlipidemia, COPD, stroke, hypothyroidism, bipolar, depression,, smoker, seizure, RVAD (right ventricular assistant device present), CAD, CABG, CKD-3, alcohol abuse, AICD placement, dCHF with EF 25-30%, on Eliquis, who presents with shortness of breath.  Patient started having shortness breath started this early morning, which has been progressively worsening.  Patient has cough with little mucus production.  Denies chest pain, fever or chills.  Patient does not have nausea, vomiting, diarrhea, abdominal pain, symptoms of UTI or unilateral weakness.  Patient has worsening bilateral lower leg edema. Oxygen saturation 82--89% on room air.  Patient also has bilateral leg edema.  ED Course: pt was found to have BNP 1089, negative COVID-19 PCR, potassium 5.2, worsening renal function, temperature normal, blood pressure 134/77, tachycardia, tachypnea, oxygen saturation 94% on 2 L nasal cannula oxygen, chest x-ray showed new left middle lobe infiltration.  Patient is admitted to progressive bed as inpatient.   Review of Systems:   General: no fevers, chills, no body weight gain, has fatigue HEENT: no blurry vision, hearing changes or sore throat Respiratory: has dyspnea, coughing, wheezing CV: no chest pain, no palpitations GI: no nausea, vomiting, abdominal pain, diarrhea, constipation GU: no dysuria, burning on urination, increased urinary frequency, hematuria  Ext: has leg edema Neuro: no unilateral weakness, numbness, or tingling, no vision change or hearing loss Skin: no rash, no skin tear. MSK: No muscle spasm, no deformity, no  limitation of range of movement in spin Heme: No easy bruising.  Travel history: No recent long distant travel.  Allergy:  Allergies  Allergen Reactions  . Iodinated Diagnostic Agents Shortness Of Breath  . Penicillins Anaphylaxis and Shortness Of Breath    Respiratory  Tolerated cefuroxime on 01/06/16  . Strawberry Extract Anaphylaxis  . Cefepime Itching    Empiric antibiotic, developed pruritis.   . Erythromycin Itching  . Sulfa Antibiotics Itching, Nausea And Vomiting and Nausea Only    Past Medical History:  Diagnosis Date  . Acute pulmonary edema (HCC) 2017  . Acute respiratory failure with hypoxia (HCC) 2017  . Bipolar 1 disorder (HCC)   . CHF (congestive heart failure) (HCC)   . Chronic kidney disease   . COPD (chronic obstructive pulmonary disease) (HCC)   . Depression   . Hypertension   . Myocardial infarction (HCC)   . NSTEMI (non-ST elevated myocardial infarction) (HCC)   . 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 Seizures (HCC)    childhood  . Stroke (HCC)   . Tobacco abuse     Past Surgical History:  Procedure Laterality Date  . CORONARY ARTERY BYPASS GRAFT      Social History:  reports that he has quit smoking. He smoked 0.00 packs per day for 0.00 years. He has never used smokeless tobacco. He reports previous alcohol use. He reports that he does not use drugs.  Family History:  Family History  Problem Relation Age of Onset  . Cancer Mother        "femal cancer"  . Prostate cancer Father      Prior to Admission medications   Medication Sig Start Date  End Date Taking? Authorizing Provider  acetaminophen (TYLENOL) 500 MG tablet Take 500 mg by mouth every 6 (six) hours as needed for mild pain.    [provider]  albuterol (VENTOLIN HFA) 108 (90 Base) MCG/ACT inhaler Inhale 2 puffs into the lungs every 6 (six) hours as needed for wheezing. 05/24/19 01/13/20  Clapacs, Jackquline Denmark, MD  apixaban (ELIQUIS) 5 MG TABS tablet Take  1 tablet (5 mg total) by mouth 2 (two) times daily. 01/13/20   Duke Salvia, MD  carvedilol (COREG) 6.25 MG tablet Take 1 tablet (6.25 mg total) by mouth 2 (two) times daily. 05/24/19 01/13/20  Clapacs, Jackquline Denmark, MD  divalproex (DEPAKOTE ER) 500 MG 24 hr tablet Take 1 tablet (500 mg total) by mouth 2 (two) times daily. 05/24/19   Clapacs, Jackquline Denmark, MD  escitalopram (LEXAPRO) 10 MG tablet Take 1 tablet (10 mg total) by mouth at bedtime. 05/24/19   Clapacs, Jackquline Denmark, MD  Fluticasone-Salmeterol (ADVAIR DISKUS) 250-50 MCG/DOSE AEPB Inhale 1 puff into the lungs 2 (two) times daily. 11/22/19 01/13/20  Delfino Lovett, MD  furosemide (LASIX) 20 MG tablet Take 1 tablet (20 mg total) by mouth daily as needed. 11/22/19 11/21/20  Delfino Lovett, MD  levothyroxine (SYNTHROID) 25 MCG tablet Take 1 tablet (25 mcg total) by mouth daily. 05/24/19 01/13/20  Clapacs, Jackquline Denmark, MD  lisinopril (ZESTRIL) 2.5 MG tablet Take 1 tablet (2.5 mg total) by mouth daily. 05/24/19 01/13/20  Clapacs, Jackquline Denmark, MD  nicotine (NICODERM CQ - DOSED IN MG/24 HOURS) 21 mg/24hr patch Place 1 patch (21 mg total) onto the skin daily. 11/23/19   Delfino Lovett, MD  omeprazole (PRILOSEC) 20 MG capsule Take 20 mg by mouth daily.    [provider]  polyethylene glycol powder (GLYCOLAX/MIRALAX) 17 GM/SCOOP powder Take 17 g (1 packet mixed in 4-8 oz of liquid)  by mouth daily as needed for moderate constipation. 03/23/19   [provider]  QUEtiapine (SEROQUEL) 100 MG tablet Take 100 mg by mouth at bedtime.    [provider]  QUETIAPINE FUMARATE ER PO Take 50 mg by mouth daily. Taking 1 tablet of 50MG  daily in the morning.    [provider]  simvastatin (ZOCOR) 40 MG tablet Take 40 mg by mouth at bedtime.    [provider]  tiotropium (SPIRIVA HANDIHALER) 18 MCG inhalation capsule Place 1 capsule (18 mcg total) into inhaler and inhale daily. 11/22/19 01/13/20  03/15/20, MD    Physical Exam: Vitals:   01/24/20 01/26/20 01/24/20 0754  01/24/20 0801 01/24/20 0830  BP: 117/80 117/80 131/74 134/77  Pulse: 93 93 (!) 107 93  Resp: (!) 40 (!) 30  (!) 45  Temp: 98.9 F (37.2 C)     TempSrc: Oral     SpO2: 96% 96% 100% 94%  Weight:  117.9 kg    Height:  5\' 9"  (1.753 m)     General: Not in acute distress HEENT:       Eyes: PERRL, EOMI, no scleral icterus.       ENT: No discharge from the ears and nose, no pharynx injection, no tonsillar enlargement.        Neck: Positive JVD, no bruit, no mass felt. Heme: No neck lymph node enlargement. Cardiac: S1/S2, RRR, No murmurs, No gallops or rubs. Respiratory: Has fine crackles bilaterally, has mild wheezing bilaterally GI: Soft, nondistended, nontender, no rebound pain, no organomegaly, BS present. GU: No hematuria Ext: 1+ pitting leg edema bilaterally. 1+DP/PT pulse  bilaterally. Musculoskeletal: No joint deformities, No joint redness or warmth, no limitation of ROM in spin. Skin: No rashes.  Neuro: Alert, oriented X3, cranial nerves II-XII grossly intact, moves all extremities normally.  Psych: Patient is not psychotic, no suicidal or hemocidal ideation.  Labs on Admission: I have personally reviewed following labs and imaging studies  CBC: Recent Labs  Lab 01/24/20 0753  WBC 11.8*  NEUTROABS 9.5*  HGB 13.0  HCT 39.1  MCV 89.7  PLT 249   Basic Metabolic Panel: Recent Labs  Lab 01/24/20 0753  NA 136  K 5.2*  CL 104  CO2 22  GLUCOSE 168*  BUN 23  CREATININE 1.68*  CALCIUM 8.6*   GFR: Estimated Creatinine Clearance: 58.5 mL/min (A) (by C-G formula based on SCr of 1.68 mg/dL (H)). Liver Function Tests: Recent Labs  Lab 01/24/20 0753  AST 35  ALT 24  ALKPHOS 102  BILITOT 0.7  PROT 7.6  ALBUMIN 3.7   No results for input(s): LIPASE, AMYLASE in the last 168 hours. No results for input(s): AMMONIA in the last 168 hours. Coagulation Profile: No results for input(s): INR, PROTIME in the last 168 hours. Cardiac Enzymes: No results for input(s): CKTOTAL,  CKMB, CKMBINDEX, TROPONINI in the last 168 hours. BNP (last 3 results) No results for input(s): PROBNP in the last 8760 hours. HbA1C: No results for input(s): HGBA1C in the last 72 hours. CBG: No results for input(s): GLUCAP in the last 168 hours. Lipid Profile: No results for input(s): CHOL, HDL, LDLCALC, TRIG, CHOLHDL, LDLDIRECT in the last 72 hours. Thyroid Function Tests: No results for input(s): TSH, T4TOTAL, FREET4, T3FREE, THYROIDAB in the last 72 hours. Anemia Panel: No results for input(s): VITAMINB12, FOLATE, FERRITIN, TIBC, IRON, RETICCTPCT in the last 72 hours. Urine analysis:    Component Value Date/Time   COLORURINE YELLOW (A) 12/23/2018 1648   APPEARANCEUR CLEAR (A) 12/23/2018 1648   LABSPEC 1.016 12/23/2018 1648   PHURINE 6.0 12/23/2018 1648   GLUCOSEU NEGATIVE 12/23/2018 1648   HGBUR SMALL (A) 12/23/2018 1648   BILIRUBINUR NEGATIVE 12/23/2018 1648   KETONESUR NEGATIVE 12/23/2018 1648   PROTEINUR NEGATIVE 12/23/2018 1648   NITRITE NEGATIVE 12/23/2018 1648   LEUKOCYTESUR NEGATIVE 12/23/2018 1648   Sepsis Labs: (procalcitonin:4,lacticidven:4) ) Recent Results (from the past 240 hour(s))  SARS Coronavirus 2 by RT PCR (hospital order, performed in [facility] hospital lab) Nasopharyngeal Nasopharyngeal Swab     Status: None   Collection Time: 01/24/20  9:27 AM   Specimen: Nasopharyngeal Swab  Result Value Ref Range Status   SARS Coronavirus 2 NEGATIVE NEGATIVE Final    Comment: (NOTE) SARS-CoV-2 target nucleic acids are NOT DETECTED.  The SARS-CoV-2 RNA is generally detectable in upper and lower respiratory specimens during the acute phase of infection. The lowest concentration of SARS-CoV-2 viral copies this assay can detect is 250 copies / mL. A negative result does not preclude SARS-CoV-2 infection and should not be used as the sole basis for treatment or other patient management decisions.  A negative result may occur with improper specimen  collection / handling, submission of specimen other than nasopharyngeal swab, presence of viral mutation(s) within the areas targeted by this assay, and inadequate number of viral copies (<250 copies / mL). A negative result must be combined with clinical observations, patient history, and epidemiological information.  Fact Sheet for Patients:   BoilerBrush.com.cy  Fact Sheet for Healthcare Providers: https://pope.com/  This test is not yet approved or  cleared by the [place] FDA  and has been authorized for detection and/or diagnosis of SARS-CoV-2 by FDA under an Emergency Use Authorization (EUA).  This EUA will remain in effect (meaning this test can be used) for the duration of the COVID-19 declaration under Section 564(b)(1) of the Act, 21 U.S.C. section 360bbb-3(b)(1), unless the authorization is terminated or revoked sooner.  Performed at [facility], [address] [place], [place] [address]      Radiological Exams on Admission: DG Chest Portable 1 View  Result Date: 01/24/2020 CLINICAL DATA:  Shortness of breath. EXAM: PORTABLE CHEST 1 VIEW COMPARISON:  11/18/2019 FINDINGS: The pacer wires are stable. Stable surgical changes from bypass surgery and valve replacement surgery. Stable cardiac enlargement and age advanced atherosclerotic calcification involving the thoracic aorta. Mild vascular congestion but no overt pulmonary edema or pleural effusions. New airspace opacity in the left mid lung suspicious for pneumonia. IMPRESSION: New left mid lung airspace opacity suspicious for pneumonia. Electronically Signed   By: Rudie Meyer M.D.   On: 01/24/2020 08:36     EKG: Independently reviewed.  Sinus rhythm, QTC 508, poor R wave progression, atypical left bundle blockage  Assessment/Plan Principal Problem:   Acute on chronic respiratory failure with hypoxia (HCC) Active Problems:   Bipolar 1 disorder (HCC)    COPD exacerbation (HCC)   Coronary artery disease   Essential hypertension   History of stroke   Hypothyroidism   Acute renal failure superimposed on stage 3a chronic kidney disease (HCC)   Hyperkalemia   Acute on chronic systolic (congestive) heart failure (HCC)   Seizure (HCC)   HLD (hyperlipidemia)   Acute on chronic respiratory failure with hypoxia: Likely due to combination of CHF and COPD exacerbation.  Patient has wheezing on auscultation, indicating COPD exacerbation.  Patient has 1+ leg edema, positive JVD, elevated BNP 1019, clinically consistent with CHF exacerbation.  Chest x-ray showed new left middle lobe infiltration, but patient has only mild leukocytosis with WBC 11.8, temperature normal.  Procalcitonin <0.10, clinically does not seem to have pneumonia.  Patient received 1 dose of vancomycin and cefepime by ED, will switch antibiotics to doxycycline for COPD exacerbation.  -Admit to progressive bed as inpatient -Nasal cannula oxygen to maintain oxygen saturation above 93% -Bronchodilators -IV Lasix for CHF -Treat COPD and CHF exacerbation as below  Acute on chronic systolic (congestive) heart failure: 2D echo on 11/18/2019 showed EF 25-30% -Lasix 40 mg bid by IV -2d echo -Daily weights -strict I/O's -Low salt diet -Fluid restriction -Obtain REDs Vest reading  COPD exacerbation: -Bronchodilators -Solu-Medrol 40 mg IV tid - Doxycycline 100 mg twice daily -Mucinex for cough  -Follow up blood culture x2, sputum culture -Nasal cannula oxygen as needed to maintain O2 saturation 93% or greater  Bipolar 1 disorder (HCC) -Depakote, Seroquel, Lexapro  Coronary artery disease: s/p of CABG. No CP -Continue Zocor, Coreg  HLD: -zocor  Essential hypertension -IV hydralazine as needed -Continue Coreg -Patient is IV Lasix -Hold lisinopril due to worsening renal function  History of stroke -Patient is on Eliquis and Toprol  Hypothyroidism -Synthroid  Acute  renal failure superimposed on stage 3a chronic kidney disease (HCC): Baseline creatinine 1.0-1.3.  His creatinine is 1.68, BUN 23.  Likely due to cardiorenal syndrome and continuation of lisinopril and diuretics. -Hold lisinopril -Avoid using renal toxic medications -Follow-up by BMP  Hyperkalemia: Potassium 5.2 -Expect correction with IV Lasix  Seizure -Seizure precaution -When necessary Ativan for seizure -Continue Home medications: Depakote    Eliquis use: pt is on Eliquis,  not sure why. He has sCHF with EF 25-30.  Chart review showed that patient has RVAD (right ventricular assist in the device presents) -Continue Eliquis       DVT ppx: on Eliquis Code Status: Full code Family Communication: not done, no family member is at bed side.  I have tried to call patient's legal guardian without success Disposition Plan:  Anticipate discharge back to previous group environment Consults called:  none Admission status:  progressive unit as inpt         Status is: Inpatient  Remains inpatient appropriate because:Inpatient level of care appropriate due to severity of illness patient's multiple comorbidities, now presents with acute on chronic respiratory failure with hypoxia possibly due to combination of CHF exacerbation and COPD exacerbation.  Patient also has hyperkalemia and worsening renal function.  His presentation is highly complicated.  Patient is at high risk of deteriorating.  Patient will need to be treated in the hospital for at least 2 days.   Dispo: The patient is from: ALF               Anticipated d/c is to: ALF               Anticipated d/c date is: 2 days              Patient currently is not medically stable to d/c.           Date of Service 01/24/2020    Lorretta HarpXilin Naiya Corral [facility]   If 7PM-7AM, please contact night-coverage [email or web address] 01/24/2020, 12:27 PM

## 2020-01-24 NOTE — Progress Notes (Signed)
*  PRELIMINARY RESULTS* Echocardiogram 2D Echocardiogram has been performed.  Carl Caul C Shaconda [name] 01/24/2020, 7:10 PM

## 2020-01-24 NOTE — Consult Note (Signed)
PHARMACY -  BRIEF ANTIBIOTIC NOTE   Pharmacy has received consult(s) for Vancomycin/Cefepime for PNA from an ED provider.  The patient's profile has been reviewed for ht/wt/allergies/indication/available labs.    One time order(s) placed for Vancomycin 2000mg  IV x 1 and Cefepime 2g IV x 1  Further antibiotics/pharmacy consults should be ordered by admitting physician if indicated.                       Thank you,  , PharmD, BCPS Clinical Pharmacist 01/24/2020 9:15 AM

## 2020-01-25 LAB — BASIC METABOLIC PANEL
Anion gap: 11 (ref 5–15)
BUN: 35 mg/dL — ABNORMAL HIGH (ref 8–23)
CO2: 24 mmol/L (ref 22–32)
Calcium: 8.8 mg/dL — ABNORMAL LOW (ref 8.9–10.3)
Chloride: 103 mmol/L (ref 98–111)
Creatinine, Ser: 1.65 mg/dL — ABNORMAL HIGH (ref 0.61–1.24)
GFR calc Af Amer: 51 mL/min — ABNORMAL LOW (ref >60)
GFR calc non Af Amer: 44 mL/min — ABNORMAL LOW (ref >60)
Glucose, Bld: 211 mg/dL — ABNORMAL HIGH (ref 70–99)
Potassium: 4.1 mmol/L (ref 3.5–5.1)
Sodium: 138 mmol/L (ref 135–145)

## 2020-01-25 LAB — CBC
HCT: 34.8 % — ABNORMAL LOW (ref 39.0–52.0)
Hemoglobin: 11.5 g/dL — ABNORMAL LOW (ref 13.0–17.0)
MCH: 29.9 pg (ref 26.0–34.0)
MCHC: 33 g/dL (ref 30.0–36.0)
MCV: 90.6 fL (ref 80.0–100.0)
Platelets: 207 10*3/uL (ref 150–400)
RBC: 3.84 MIL/uL — ABNORMAL LOW (ref 4.22–5.81)
RDW: 16.1 % — ABNORMAL HIGH (ref 11.5–15.5)
WBC: 14.6 10*3/uL — ABNORMAL HIGH (ref 4.0–10.5)
nRBC: 0 % (ref 0.0–0.2)

## 2020-01-25 LAB — GLUCOSE, CAPILLARY: Glucose-Capillary: 252 mg/dL — ABNORMAL HIGH (ref 70–99)

## 2020-01-25 LAB — LACTIC ACID, PLASMA
Lactic Acid, Venous: 4.5 mmol/L (ref 0.5–1.9)
Lactic Acid, Venous: 3.3 mmol/L (ref 0.5–1.9)

## 2020-01-25 LAB — MAGNESIUM: Magnesium: 1.9 mg/dL (ref 1.7–2.4)

## 2020-01-25 MED ORDER — ENSURE ENLIVE PO LIQD
237.0000 mL | Freq: Two times a day (BID) | ORAL | Status: DC
  Administered 2020-01-25 – 2020-01-27 (×2): 237 mL via ORAL

## 2020-01-25 MED ORDER — PREDNISONE 20 MG PO TABS
40.0000 mg | ORAL_TABLET | Freq: Every day | ORAL | Status: DC
  Administered 2020-01-26 – 2020-01-27 (×2): 40 mg via ORAL
  Filled 2020-01-25 (×2): qty 2

## 2020-01-25 MED ORDER — SODIUM CHLORIDE 0.9 % IV SOLN
1.0000 g | INTRAVENOUS | Status: DC
  Administered 2020-01-25 – 2020-01-26 (×2): 1 g via INTRAVENOUS
  Filled 2020-01-25: qty 1
  Filled 2020-01-25: qty 10
  Filled 2020-01-25: qty 1

## 2020-01-25 MED ORDER — DIPHENHYDRAMINE HCL 50 MG/ML IJ SOLN: 25.0000 mg | Freq: Four times a day (QID) | INTRAMUSCULAR | Status: DC | PRN

## 2020-01-25 MED ORDER — SODIUM CHLORIDE 0.9 % IV SOLN
500.0000 mg | INTRAVENOUS | Status: DC
  Administered 2020-01-25: 500 mg via INTRAVENOUS
  Filled 2020-01-25 (×2): qty 500

## 2020-01-25 NOTE — Progress Notes (Signed)
PROGRESS NOTE    Carl [name]   ZOX:[id number]  DOB: 1961-01-19  PCP: [facility]    DOA: 01/24/2020 LOS: 1   Brief Narrative   Carl [name] is a 59 y.o. male with medical history of hypertension, hyperlipidemia, COPD, CVA, hypothyroidism, bipolar disorder, depression, smoker, seizure, RVAD (right ventricular assistant device present), CAD, CABG, CKD-3, alcohol abuse, AICD placement, sCHF with EF 25-30%, on Eliquis, who presented to the ED on 01/24/20 with progressive shortness of breath, productive cough and lower extremity edema.   In the ED, oxygen saturation 82--89% on room air but imporved to 94% on 2L/min oxygen.  Afebrile, tachycardic and tachypneic.  Labs notable for BNP 1089, Covid-19 PCR negative, K 5.2, worsening renal function.  Chest xray showed new left mid lung infiltrate.   Admitted to hospitalist service.  On IV antibiotics for PNA and undergoing IV diuresis.  Echo on 7/19 showed EF 25-30% with LV global hypokinesis, grade 1 diastolic dysfunction, mild-mod MR (unchanged from prior Echo).     Assessment & Plan   Principal Problem:   Acute on chronic respiratory failure with hypoxia (HCC) Active Problems:   Bipolar 1 disorder (HCC)   COPD exacerbation (HCC)   Coronary artery disease   Essential hypertension   History of stroke   Hypothyroidism   Acute renal failure superimposed on stage 3a chronic kidney disease (HCC)   Hyperkalemia   Acute on chronic systolic (congestive) heart failure (HCC)   Seizure (HCC)   HLD (hyperlipidemia)   Acute on chronic respiratory failure with hypoxia -present on admission, due to pneumonia, CHF and mild COPD exacerbation.  Supplemental O2 to maintain sat greater than 88%.  Monitor respiratory status closely.  Further management follow.  Sepsis -present on admission with tachycardia, tachypnea, leukocytosis and chest x-ray concerning for pneumonia.  Treated per sepsis protocol in the ED.  Lactic acid increased  today from 2.5-4.5.  Repeat is pending.  Undergoing IV diuresis for acute on chronic systolic CHF so will hold fluids for now and monitor.  Community-acquired pneumonia - present on admission symptoms as above and x-ray showing infiltration in the left midlung zone.  Continue Rocephin and Zithromax IV.  Follow-up blood and sputum cultures.  Antitussives as needed.  Tylenol as needed fever.  Acute on chronic mixed systolic/diastolic CHF -present on admission with symptoms of worsening shortness of breath, edema, BNP over 1000.  Improving with diuresis.  Continue IV Lasix 40 mg IV twice daily.  Likely can scale back tomorrow.  Monitor volume status closely -strict I/O's and daily weights.  Low-sodium diet with fluid restriction.  Echo is unchanged from prior, no cardiology consult indicated at this time.  Outpatient cardiology follow-up.  Monitor renal function and electrolytes closely with diuresis.  COPD exacerbation -mild, present on admission.  Not wheezing today.  Will change steroids to prednisone 40 daily and taper over few days.  Continue bronchodilators, Zithromax as above.  AKI superimposed on CKD stage IIIa -AKI present on admission likely prerenal versus cardiorenal syndrome.  Presented with creatinine 1.68, baseline appears to be 1.01.3.  Hold lisinopril.  Avoid nephrotoxins and hypotension.  Daily BMP to monitor.  Hyperkalemia -presented with K5.2.  Resolved with IV Lasix.  Daily BMP.  Coronary artery disease -chronic, stable without any chest pain or ischemic changes on EKG.  History of CABG.  Continue Coreg and Zocor.  Essential hypertension -continue home Coreg, hold lisinopril due to AKI.  Undergoing IV diuresis.  IV hydralazine as needed.  Hyperlipidemia -continue Zocor  Seizure disorder -continue home Depakote, as needed Ativan for seizure activity.  Seizure precautions.  Bipolar 1 disorder -continue home Depakote, Seroquel, Lexapro  Hypothyroidism -continue  Synthroid  History of CVA -continue Eliquis   Obesity: Body mass index is 30.27 kg/m.  Complicates overall care and prognosis.   DVT prophylaxis:  apixaban (ELIQUIS) tablet 5 mg   Diet:  Diet Orders (From admission, onward)    Start     Ordered   01/24/20 1427  Diet 2 gram sodium Room service appropriate? Yes; Fluid consistency: Thin  Diet effective now       Question Answer Comment  Room service appropriate? Yes   Fluid consistency: Thin      01/24/20 1426            Code Status: Full Code    Subjective 01/25/20    Patient seen and examined at bedside today.  He says he is feeling better.  Denies any chest pain or shortness of breath.  Says his leg swelling is improved.  Denies fevers or chills.  No acute events reported no further acute complaints.   Disposition Plan & Communication   Status is: Inpatient  Remains inpatient appropriate because:IV treatments appropriate due to intensity of illness or inability to take PO   Dispo: The patient is from: ALF              Anticipated d/c is to: ALF              Anticipated d/c date is: 2 days              Patient currently is not medically stable to d/c.        Family Communication: None at bedside, will attempt to call   Consults, Procedures, Significant Events   Consultants:   None  Procedures:   Echo  Antimicrobials:   Rocephin and Zithromax   Objective   Vitals:   01/25/20 0509 01/25/20 0747 01/25/20 1056 01/25/20 1526  BP: (!) 116/58 109/64 (!) 115/59 (!) 102/56  Pulse: 68 67 67 72  Resp:  17 18 18   Temp: 97.6 F (36.4 C) (!) 97.5 F (36.4 C) 97.6 F (36.4 C) 97.9 F (36.6 C)  TempSrc: Oral Oral Oral Oral  SpO2: 98% 96% 98% 95%  Weight: 93 kg     Height:        Intake/Output Summary (Last 24 hours) at 01/25/2020 1721 Last data filed at 01/25/2020 1707 Gross per 24 hour  Intake 960 ml  Output 2075 ml  Net -1115 ml   Filed Weights   01/24/20 1421 01/24/20 1427 01/25/20 0509   Weight: 93.1 kg 93.1 kg 93 kg    Physical Exam:  General exam: awake, alert, no acute distress HEENT: moist mucus membranes, hearing grossly normal  Respiratory system: Left-sided crackles, no wheezes or rhonchi, normal respiratory effort. Cardiovascular system: normal S1/S2, RRR, no JVD, murmurs, rubs, gallops, no pedal edema.   Gastrointestinal system: soft, NT, ND, no HSM felt, +bowel sounds. Central nervous system: A&O x3. no gross focal neurologic deficits, normal speech Extremities: moves all, no cyanosis, normal tone Skin: dry, intact, normal temperature, normal color Psychiatry: normal mood, congruent affect, judgement and insight appear normal  Labs   Data Reviewed: I have personally reviewed following labs and imaging studies  CBC: Recent Labs  Lab 01/24/20 0753 01/25/20 0456  WBC 11.8* 14.6*  NEUTROABS 9.5*  --   HGB 13.0 11.5*  HCT 39.1 34.8*  MCV 89.7 90.6  PLT 249 207   Basic Metabolic Panel: Recent Labs  Lab 01/24/20 0753 01/25/20 0456  NA 136 138  K 5.2* 4.1  CL 104 103  CO2 22 24  GLUCOSE 168* 211*  BUN 23 35*  CREATININE 1.68* 1.65*  CALCIUM 8.6* 8.8*  MG  --  1.9   GFR: Estimated Creatinine Clearance: 52.9 mL/min (A) (by C-G formula based on SCr of 1.65 mg/dL (H)). Liver Function Tests: Recent Labs  Lab 01/24/20 0753  AST 35  ALT 24  ALKPHOS 102  BILITOT 0.7  PROT 7.6  ALBUMIN 3.7   No results for input(s): LIPASE, AMYLASE in the last 168 hours. No results for input(s): AMMONIA in the last 168 hours. Coagulation Profile: No results for input(s): INR, PROTIME in the last 168 hours. Cardiac Enzymes: No results for input(s): CKTOTAL, CKMB, CKMBINDEX, TROPONINI in the last 168 hours. BNP (last 3 results) No results for input(s): PROBNP in the last 8760 hours. HbA1C: No results for input(s): HGBA1C in the last 72 hours. CBG: No results for input(s): GLUCAP in the last 168 hours. Lipid Profile: No results for input(s): CHOL, HDL,  LDLCALC, TRIG, CHOLHDL, LDLDIRECT in the last 72 hours. Thyroid Function Tests: No results for input(s): TSH, T4TOTAL, FREET4, T3FREE, THYROIDAB in the last 72 hours. Anemia Panel: No results for input(s): VITAMINB12, FOLATE, FERRITIN, TIBC, IRON, RETICCTPCT in the last 72 hours. Sepsis Labs: Recent Labs  Lab 01/24/20 0753 01/24/20 1107 01/25/20 0906  PROCALCITON <0.10  --   --   LATICACIDVEN  --  2.5* 4.5*    Recent Results (from the past 240 hour(s))  Blood culture (routine x 2)     Status: None (Preliminary result)   Collection Time: 01/24/20  9:26 AM   Specimen: BLOOD  Result Value Ref Range Status   Specimen Description BLOOD LEFT ASSIST CONTROL  Final   Special Requests   Final    BOTTLES DRAWN AEROBIC AND ANAEROBIC Blood Culture adequate volume   Culture   Final    NO GROWTH < 24 HOURS Performed at [facility], [address], [place], [place] [address]    Report Status PENDING  Incomplete  Blood culture (routine x 2)     Status: None (Preliminary result)   Collection Time: 01/24/20  9:27 AM   Specimen: BLOOD  Result Value Ref Range Status   Specimen Description BLOOD LEFT HAND  Final   Special Requests   Final    BOTTLES DRAWN AEROBIC AND ANAEROBIC Blood Culture results may not be optimal due to an inadequate volume of blood received in culture bottles   Culture   Final    NO GROWTH < 24 HOURS Performed at [facility], [address] [place], [place] [address]    Report Status PENDING  Incomplete  SARS Coronavirus 2 by RT PCR (hospital order, performed in [facility] hospital lab) Nasopharyngeal Nasopharyngeal Swab     Status: None   Collection Time: 01/24/20  9:27 AM   Specimen: Nasopharyngeal Swab  Result Value Ref Range Status   SARS Coronavirus 2 NEGATIVE NEGATIVE Final    Comment: (NOTE) SARS-CoV-2 target nucleic acids are NOT DETECTED.  The SARS-CoV-2 RNA is generally detectable in upper and lower respiratory specimens during the  acute phase of infection. The lowest concentration of SARS-CoV-2 viral copies this assay can detect is 250 copies / mL. A negative result does not preclude SARS-CoV-2 infection and should not be used as the sole basis for treatment or other  patient management decisions.  A negative result may occur with improper specimen collection / handling, submission of specimen other than nasopharyngeal swab, presence of viral mutation(s) within the areas targeted by this assay, and inadequate number of viral copies (<250 copies / mL). A negative result must be combined with clinical observations, patient history, and epidemiological information.  Fact Sheet for Patients:   BoilerBrush.com.cy  Fact Sheet for Healthcare Providers: https://pope.com/  This test is not yet approved or  cleared by the [place] FDA and has been authorized for detection and/or diagnosis of SARS-CoV-2 by FDA under an Emergency Use Authorization (EUA).  This EUA will remain in effect (meaning this test can be used) for the duration of the COVID-19 declaration under Section 564(b)(1) of the Act, 21 U.S.C. section 360bbb-3(b)(1), unless the authorization is terminated or revoked sooner.  Performed at [facility], [address] [place], [place] [address]   Expectorated sputum assessment w rflx to resp cult     Status: None   Collection Time: 01/24/20  2:46 PM   Specimen: Expectorated Sputum  Result Value Ref Range Status   Specimen Description EXPECTORATED SPUTUM  Final   Special Requests NONE  Final   Sputum evaluation   Final    Sputum specimen not acceptable for testing.  Please recollect.   AMY DALTON RN AT 1639 ON 01/24/20 [facility] Performed at [facility], [address] [place], [place] [address]    Report Status 01/24/2020 FINAL  Final  MRSA PCR Screening     Status: None   Collection Time: 01/24/20  2:46 PM   Specimen: Nasal Mucosa;  Nasopharyngeal  Result Value Ref Range Status   MRSA by PCR NEGATIVE NEGATIVE Final    Comment:        The GeneXpert MRSA Assay (FDA approved for NASAL specimens only), is one component of a comprehensive MRSA colonization surveillance program. It is not intended to diagnose MRSA infection nor to guide or monitor treatment for MRSA infections. Performed at [facility], [address], [place], [place] [address]       Imaging Studies   DG Chest Portable 1 View  Result Date: 01/24/2020 CLINICAL DATA:  Shortness of breath. EXAM: PORTABLE CHEST 1 VIEW COMPARISON:  11/18/2019 FINDINGS: The pacer wires are stable. Stable surgical changes from bypass surgery and valve replacement surgery. Stable cardiac enlargement and age advanced atherosclerotic calcification involving the thoracic aorta. Mild vascular congestion but no overt pulmonary edema or pleural effusions. New airspace opacity in the left mid lung suspicious for pneumonia. IMPRESSION: New left mid lung airspace opacity suspicious for pneumonia. Electronically Signed   By: Rudie Meyer M.D.   On: 01/24/2020 08:36   ECHOCARDIOGRAM COMPLETE  Result Date: 01/24/2020    ECHOCARDIOGRAM REPORT   Patient Name:   ELADIO DENTREMONT Date of Exam: 01/24/2020 Medical Rec #:  [id number]    Height:       69.0 in Accession #:    [id number]   Weight:       205.3 lb Date of Birth:  [birth year]    BSA:          2.089 m Patient Age:    59 years     BP:           114/67 mmHg Patient Gender: M            HR:           68 bpm. Exam Location:  [facility] Procedure: 2D Echo, Cardiac  Doppler and Color Doppler Indications:     CHF-Acute Systolic 428.21 / I50.21  History:         Patient has prior history of Echocardiogram examinations.                  Stroke; Risk Factors:Hypertension. MI.  Sonographer:     Neysa Bonito Roar Referring Phys:  Wynona Neat NIU Diagnosing Phys: Adrian Blackwater MD IMPRESSIONS  1. Left ventricular ejection fraction, by estimation, is 25 to 30%.  The left ventricle has severely decreased function. The left ventricle demonstrates global hypokinesis. Left ventricular diastolic parameters are consistent with Grade I diastolic dysfunction (impaired relaxation).  2. Right ventricular systolic function is mildly reduced. The right ventricular size is moderately enlarged. There is mildly elevated pulmonary artery systolic pressure.  3. Left atrial size was mild to moderately dilated.  4. Right atrial size was moderately dilated.  5. The mitral valve is degenerative. Mild to moderate mitral valve regurgitation. No evidence of mitral stenosis.  6. The aortic valve is normal in structure. Aortic valve regurgitation is trivial. No aortic stenosis is present.  7. The inferior vena cava is normal in size with greater than 50% respiratory variability, suggesting right atrial pressure of 3 mmHg. Conclusion(s)/Recommendation(s): Findings consistent with ischemic cardiomyopathy. FINDINGS  Left Ventricle: Left ventricular ejection fraction, by estimation, is 25 to 30%. The left ventricle has severely decreased function. The left ventricle demonstrates global hypokinesis. The left ventricular internal cavity size was normal in size. There is no left ventricular hypertrophy. Left ventricular diastolic parameters are consistent with Grade I diastolic dysfunction (impaired relaxation). Right Ventricle: The right ventricular size is moderately enlarged. No increase in right ventricular wall thickness. Right ventricular systolic function is mildly reduced. There is mildly elevated pulmonary artery systolic pressure. The tricuspid regurgitant velocity is 2.93 m/s, and with an assumed right atrial pressure of 10 mmHg, the estimated right ventricular systolic pressure is 44.3 mmHg. Left Atrium: Left atrial size was mild to moderately dilated. Right Atrium: Right atrial size was moderately dilated. Pericardium: There is no evidence of pericardial effusion. Mitral Valve: The mitral  valve is degenerative in appearance. There is moderate thickening of the posterior mitral valve leaflet(s). There is moderate calcification of the posterior mitral valve leaflet(s). Moderately decreased mobility of the mitral valve leaflets. Severe mitral annular calcification. Mild to moderate mitral valve regurgitation. No evidence of mitral valve stenosis. MV peak gradient, 13.0 mmHg. The mean mitral valve gradient is 6.0 mmHg. Tricuspid Valve: The tricuspid valve is normal in structure. Tricuspid valve regurgitation is mild . No evidence of tricuspid stenosis. Aortic Valve: The aortic valve is normal in structure. Aortic valve regurgitation is trivial. No aortic stenosis is present. Aortic valve mean gradient measures 4.0 mmHg. Aortic valve peak gradient measures 7.1 mmHg. Aortic valve area, by VTI measures 2.83 cm. Pulmonic Valve: The pulmonic valve was normal in structure. Pulmonic valve regurgitation is not visualized. No evidence of pulmonic stenosis. Aorta: The aortic root is normal in size and structure. Venous: The inferior vena cava is normal in size with greater than 50% respiratory variability, suggesting right atrial pressure of 3 mmHg. IAS/Shunts: No atrial level shunt detected by color flow Doppler.  LEFT VENTRICLE PLAX 2D LVIDd:         6.39 cm      Diastology LVIDs:         5.44 cm      LV e' lateral:   8.81 cm/s LV PW:  1.25 cm      LV E/e' lateral: 18.6 LV IVS:        1.45 cm      LV e' medial:    5.44 cm/s LVOT diam:     2.10 cm      LV E/e' medial:  30.1 LV SV:         80 LV SV Index:   38 LVOT Area:     3.46 cm  LV Volumes (MOD) LV vol d, MOD A2C: 174.0 ml LV vol d, MOD A4C: 145.0 ml LV vol s, MOD A2C: 126.0 ml LV vol s, MOD A4C: 101.0 ml LV SV MOD A2C:     48.0 ml LV SV MOD A4C:     145.0 ml LV SV MOD BP:      48.0 ml RIGHT VENTRICLE RV Mid diam:    3.28 cm RV S prime:     11.20 cm/s TAPSE (M-mode): 1.3 cm LEFT ATRIUM              Index       RIGHT ATRIUM           Index LA diam:         4.80 cm  2.30 cm/m  RA Area:     17.70 cm LA Vol (A2C):   95.5 ml  45.71 ml/m RA Volume:   45.10 ml  21.59 ml/m LA Vol (A4C):   101.0 ml 48.35 ml/m LA Biplane Vol: 101.0 ml 48.35 ml/m  AORTIC VALVE                   PULMONIC VALVE AV Area (Vmax):    2.86 cm    PV Vmax:       1.19 m/s AV Area (Vmean):   2.42 cm    PV Peak grad:  5.7 mmHg AV Area (VTI):     2.83 cm AV Vmax:           133.00 cm/s AV Vmean:          99.500 cm/s AV VTI:            0.283 m AV Peak Grad:      7.1 mmHg AV Mean Grad:      4.0 mmHg LVOT Vmax:         110.00 cm/s LVOT Vmean:        69.600 cm/s LVOT VTI:          0.231 m LVOT/AV VTI ratio: 0.82  AORTA Ao Root diam: 2.90 cm MITRAL VALVE                TRICUSPID VALVE MV Area (PHT): 3.27 cm     TR Peak grad:   34.3 mmHg MV Peak grad:  13.0 mmHg    TR Vmax:        293.00 cm/s MV Mean grad:  6.0 mmHg MV Vmax:       1.80 m/s     SHUNTS MV Vmean:      118.0 cm/s   Systemic VTI:  0.23 m MV Decel Time: 232 msec     Systemic Diam: 2.10 cm MV E velocity: 164.00 cm/s MV A velocity: 111.00 cm/s MV E/A ratio:  1.48 MV A Prime:    6.4 cm/s Adrian Blackwater MD Electronically signed by Adrian Blackwater MD Signature Date/Time: 01/24/2020/8:53:51 PM    Final      Medications   Scheduled Meds: . apixaban  5 mg Oral BID  .  carvedilol  6.25 mg Oral BID  . divalproex  500 mg Oral BID  . escitalopram  10 mg Oral QHS  . feeding supplement (ENSURE ENLIVE)  237 mL Oral BID BM  . furosemide  40 mg Intravenous Q12H  . levothyroxine  25 mcg Oral Q0600  . mometasone-formoterol  2 puff Inhalation BID  . nicotine  21 mg Transdermal Daily  . pantoprazole  40 mg Oral Daily  . [START ON 01/26/2020] predniSONE  40 mg Oral Q breakfast  . QUEtiapine  100 mg Oral QHS  . QUEtiapine  50 mg Oral q AM  . simvastatin  40 mg Oral QHS  . sodium chloride flush  3 mL Intravenous Q12H   Continuous Infusions: . sodium chloride    . azithromycin 500 mg (01/25/20 1217)  . cefTRIAXone (ROCEPHIN)  IV 1 g (01/25/20  1129)       LOS: 1 day    Time spent: 30 minutes    Pennie BanterKelly A Cordie Buening, DO [facility]  01/25/2020, 5:21 PM    If 7PM-7AM, please contact night-coverage. How to contact the [facility] Attending or Consulting provider 7A - 7P or covering provider during after hours 7P -7A, for this patient?    1. Check the care team in [facility] and look for a) attending/consulting [facility] provider listed and b) the [facility] team listed 2. Log into [email or web address] and use [place]'s universal password to access. If you do not have the password, please contact the hospital operator. 3. Locate the [facility] provider you are looking for under [facility] and page to a number that you can be directly reached. 4. If you still have difficulty reaching the provider, please page the [facility] (Director on Call) for the Hospitalists listed on amion for assistance.

## 2020-01-25 NOTE — Hospital Course (Addendum)
Wilmore Rodenberg is a 59 y.o. male with medical history of hypertension, hyperlipidemia, COPD, CVA, hypothyroidism, bipolar disorder, depression, smoker, seizure, RVAD (right ventricular assistant device present), CAD, CABG, CKD-3, alcohol abuse, AICD placement, sCHF with EF 25-30%, on Eliquis, who presented to the ED on 01/24/20 with progressive shortness of breath, productive cough and lower extremity edema.   In the ED, oxygen saturation 82--89% on room air but imporved to 94% on 2L/min oxygen.  Afebrile, tachycardic and tachypneic.  Labs notable for BNP 1089, Covid-19 PCR negative, K 5.2, worsening renal function.  Chest xray showed new left mid lung infiltrate.   Admitted to hospitalist service.  On IV antibiotics for PNA and undergoing IV diuresis.  Echo on 7/19 showed EF 25-30% with LV global hypokinesis, grade 1 diastolic dysfunction, mild-mod MR (unchanged from prior Echo).

## 2020-01-26 LAB — BASIC METABOLIC PANEL
Anion gap: 9 (ref 5–15)
BUN: 46 mg/dL — ABNORMAL HIGH (ref 8–23)
CO2: 28 mmol/L (ref 22–32)
Calcium: 9.4 mg/dL (ref 8.9–10.3)
Chloride: 101 mmol/L (ref 98–111)
Creatinine, Ser: 1.34 mg/dL — ABNORMAL HIGH (ref 0.61–1.24)
GFR calc Af Amer: >60 mL/min (ref >60)
GFR calc non Af Amer: 57 mL/min — ABNORMAL LOW (ref >60)
Glucose, Bld: 147 mg/dL — ABNORMAL HIGH (ref 70–99)
Potassium: 4.2 mmol/L (ref 3.5–5.1)
Sodium: 138 mmol/L (ref 135–145)

## 2020-01-26 LAB — CBC
HCT: 32.9 % — ABNORMAL LOW (ref 39.0–52.0)
Hemoglobin: 11.4 g/dL — ABNORMAL LOW (ref 13.0–17.0)
MCH: 30.4 pg (ref 26.0–34.0)
MCHC: 34.7 g/dL (ref 30.0–36.0)
MCV: 87.7 fL (ref 80.0–100.0)
Platelets: 230 10*3/uL (ref 150–400)
RBC: 3.75 MIL/uL — ABNORMAL LOW (ref 4.22–5.81)
RDW: 16.3 % — ABNORMAL HIGH (ref 11.5–15.5)
WBC: 16.8 10*3/uL — ABNORMAL HIGH (ref 4.0–10.5)
nRBC: 0.1 % (ref 0.0–0.2)

## 2020-01-26 LAB — MAGNESIUM: Magnesium: 2 mg/dL (ref 1.7–2.4)

## 2020-01-26 MED ORDER — AZITHROMYCIN 250 MG PO TABS
500.0000 mg | ORAL_TABLET | Freq: Every day | ORAL | Status: DC
  Administered 2020-01-26 – 2020-01-27 (×2): 500 mg via ORAL
  Filled 2020-01-26 (×2): qty 2

## 2020-01-26 NOTE — Progress Notes (Signed)
Progress Note    Carl [name]  WJX:[id number] DOB: 11/30/1960  DOA: 01/24/2020 PCP: [facility]      Brief Narrative:    Medical records reviewed and are as summarized below:  Carl [name] is a 59 y.o. male with medical history ofhypertension, hyperlipidemia, COPD, CVA, hypothyroidism, bipolar disorder, depression, smoker, seizure, RVAD (right ventricular assistant device present), CAD, CABG, CKD-3, alcohol abuse, AICD placement,sCHF with EF 25-30%,on Eliquis,who presented to the ED on 01/24/20 with progressive shortness of breath, productive cough and lower extremity edema.  In the ED, oxygen saturation 82--89% on room air but imporved to 94% on 2L/min oxygen.   Echo on 7/19 showed EF 25-30% with LV global hypokinesis, grade 1 diastolic dysfunction, mild-mod MR (unchanged from prior Echo).   Assessment/Plan:   Principal Problem:   Acute on chronic respiratory failure with hypoxia (HCC) Active Problems:   Bipolar 1 disorder (HCC)   COPD exacerbation (HCC)   Coronary artery disease   Essential hypertension   History of stroke   Hypothyroidism   Acute renal failure superimposed on stage 3a chronic kidney disease (HCC)   Hyperkalemia   Acute on chronic systolic (congestive) heart failure (HCC)   Seizure (HCC)   HLD (hyperlipidemia)   Acute on chronic respiratory failure with hypoxia -present on admission, due to pneumonia, CHF and mild COPD exacerbation.  Supplemental O2 to maintain sat greater than 88%.  Monitor respiratory status closely.   Sepsis -present on admission with tachycardia, tachypnea, leukocytosis and chest x-ray concerning for pneumonia.  Treated per sepsis protocol in the ED.  Lactic acid peaked at 4.5.  Repeat lactic acid was 3.3 on 01/25/2020.   Community-acquired pneumonia - present on admission symptoms as above and x-ray showing infiltration in the left midlung zone.  Continue IV Rocephin and Zithromax. Follow-up blood cultures.   Antitussives as needed.  Tylenol as needed fever.  Acute on chronic mixed systolic/diastolic CHF -present on admission with symptoms of worsening shortness of breath, edema, BNP over 1000.  Improving with diuresis.  Continue IV Lasix 40 mg IV twice daily.   Monitor BMP, daily weights and urine output. Echo is unchanged from prior, no cardiology consult indicated at this time.  Outpatient cardiology follow-up.  Monitor renal function and electrolytes closely with diuresis.  COPD exacerbation -mild, present on admission. continue prednisone.  Bronchodilators, Zithromax as above.  AKI superimposed on CKD stage IIIa -AKI present on admission likely prerenal versus cardiorenal syndrome.  Presented with creatinine 1.68, baseline appears to be around 1.01.  Hold lisinopril.  Avoid nephrotoxins and hypotension.  Daily BMP to monitor.  Hyperkalemia -presented with K5.2.  Resolved.  Coronary artery disease -chronic, stable without any chest pain or ischemic changes on EKG.  History of CABG.  Continue Coreg and Zocor.  Essential hypertension -continue home Coreg, hold lisinopril due to AKI.  Undergoing IV diuresis.  IV hydralazine as needed.  Hyperlipidemia -continue Zocor  Seizure disorder -continue home Depakote, as needed Ativan for seizure activity.  Seizure precautions.  Bipolar 1 disorder -continue home Depakote, Seroquel, Lexapro  Hypothyroidism -continue Synthroid  History of CVA -continue Eliquis    Body mass index is 29.82 kg/m.  Diet Order            Diet 2 gram sodium Room service appropriate? Yes; Fluid consistency: Thin  Diet effective now                       Medications:   .  apixaban  5 mg Oral BID  . azithromycin  500 mg Oral Daily  . carvedilol  6.25 mg Oral BID  . divalproex  500 mg Oral BID  . escitalopram  10 mg Oral QHS  . feeding supplement (ENSURE ENLIVE)  237 mL Oral BID BM  . furosemide  40 mg Intravenous Q12H  . levothyroxine  25 mcg  Oral Q0600  . mometasone-formoterol  2 puff Inhalation BID  . nicotine  21 mg Transdermal Daily  . pantoprazole  40 mg Oral Daily  . predniSONE  40 mg Oral Q breakfast  . QUEtiapine  100 mg Oral QHS  . QUEtiapine  50 mg Oral q AM  . simvastatin  40 mg Oral QHS  . sodium chloride flush  3 mL Intravenous Q12H   Continuous Infusions: . sodium chloride    . cefTRIAXone (ROCEPHIN)  IV 1 g (01/26/20 1121)     Anti-infectives (From admission, onward)   Start     Dose/Rate Route Frequency Ordered Stop   01/26/20 1100  azithromycin (ZITHROMAX) tablet 500 mg     Discontinue     500 mg Oral Daily 01/26/20 1008 01/30/20 0959   01/25/20 1145  cefTRIAXone (ROCEPHIN) 1 g in sodium chloride 0.9 % 100 mL IVPB     Discontinue     1 g 200 mL/hr over 30 Minutes Intravenous Every 24 hours 01/25/20 1059     01/25/20 1145  azithromycin (ZITHROMAX) 500 mg in sodium chloride 0.9 % 250 mL IVPB  Status:  Discontinued        500 mg 250 mL/hr over 60 Minutes Intravenous Every 24 hours 01/25/20 1059 01/26/20 1008   01/24/20 1200  doxycycline (VIBRA-TABS) tablet 100 mg  Status:  Discontinued        100 mg Oral Every 12 hours 01/24/20 1146 01/25/20 1059   01/24/20 0915  vancomycin (VANCOCIN) IVPB 1000 mg/200 mL premix  Status:  Discontinued        1,000 mg 200 mL/hr over 60 Minutes Intravenous  Once 01/24/20 0904 01/24/20 0913   01/24/20 0915  ceFEPIme (MAXIPIME) 2 g in sodium chloride 0.9 % 100 mL IVPB        2 g 200 mL/hr over 30 Minutes Intravenous  Once 01/24/20 0904 01/24/20 1107   01/24/20 0915  vancomycin (VANCOREADY) IVPB 2000 mg/400 mL        2,000 mg 200 mL/hr over 120 Minutes Intravenous  Once 01/24/20 0913 01/24/20 1508             Family Communication/Anticipated D/C date and plan/Code Status   DVT prophylaxis:  apixaban (ELIQUIS) tablet 5 mg     Code Status: Full Code  Family Communication:  Disposition Plan:    Status is: Inpatient  Remains inpatient appropriate because:IV  treatments appropriate due to intensity of illness or inability to take PO and Inpatient level of care appropriate due to severity of illness   Dispo:  Patient From: Assisted Living Facility  Planned Disposition: ALF  Expected discharge date: 01/27/20  Medically stable for discharge: No            Subjective:   C/o difficulty breathing and coughing up "lots of mucus".  Objective:    Vitals:   01/25/20 1937 01/26/20 0420 01/26/20 0746 01/26/20 1153  BP: 118/64 127/71 112/73 125/88  Pulse: 67 62 75 64  Resp: 19 19    Temp: 97.6 F (36.4 C) 98.1 F (36.7 C) 97.7 F (36.5 C) (!) 97.5 F (36.4  C)  TempSrc: Oral Oral Oral Oral  SpO2: 99% 97% 97% 97%  Weight:  91.6 kg    Height:       No data found.   Intake/Output Summary (Last 24 hours) at 01/26/2020 1437 Last data filed at 01/26/2020 1400 Gross per 24 hour  Intake 1788.41 ml  Output 2525 ml  Net -736.59 ml   Filed Weights   01/24/20 1427 01/25/20 0509 01/26/20 0420  Weight: 93.1 kg 93 kg 91.6 kg    Exam:  GEN: NAD SKIN: No rash EYES: EOMI ENT: MMM, poor dentition CV: RRR PULM: Bilateral wheezing and rales ABD: soft, ND, NT, +BS, epigastric hernia (nontender) CNS: AAO x 3, non focal EXT: No edema or tenderness   Data Reviewed:   I have personally reviewed following labs and imaging studies:  Labs: Labs show the following:   Basic Metabolic Panel: Recent Labs  Lab 01/24/20 0753 01/24/20 0753 01/25/20 0456 01/26/20 0424  NA 136  --  138 138  K 5.2*   < > 4.1 4.2  CL 104  --  103 101  CO2 22  --  24 28  GLUCOSE 168*  --  211* 147*  BUN 23  --  35* 46*  CREATININE 1.68*  --  1.65* 1.34*  CALCIUM 8.6*  --  8.8* 9.4  MG  --   --  1.9 2.0   < > = values in this interval not displayed.   GFR Estimated Creatinine Clearance: 64.8 mL/min (A) (by C-G formula based on SCr of 1.34 mg/dL (H)). Liver Function Tests: Recent Labs  Lab 01/24/20 0753  AST 35  ALT 24  ALKPHOS 102  BILITOT 0.7   PROT 7.6  ALBUMIN 3.7   No results for input(s): LIPASE, AMYLASE in the last 168 hours. No results for input(s): AMMONIA in the last 168 hours. Coagulation profile No results for input(s): INR, PROTIME in the last 168 hours.  CBC: Recent Labs  Lab 01/24/20 0753 01/25/20 0456 01/26/20 0424  WBC 11.8* 14.6* 16.8*  NEUTROABS 9.5*  --   --   HGB 13.0 11.5* 11.4*  HCT 39.1 34.8* 32.9*  MCV 89.7 90.6 87.7  PLT 249 207 230   Cardiac Enzymes: No results for input(s): CKTOTAL, CKMB, CKMBINDEX, TROPONINI in the last 168 hours. BNP (last 3 results) No results for input(s): PROBNP in the last 8760 hours. CBG: Recent Labs  Lab 01/25/20 2115  GLUCAP 252*   D-Dimer: No results for input(s): DDIMER in the last 72 hours. Hgb A1c: No results for input(s): HGBA1C in the last 72 hours. Lipid Profile: No results for input(s): CHOL, HDL, LDLCALC, TRIG, CHOLHDL, LDLDIRECT in the last 72 hours. Thyroid function studies: No results for input(s): TSH, T4TOTAL, T3FREE, THYROIDAB in the last 72 hours.  Invalid input(s): FREET3 Anemia work up: No results for input(s): VITAMINB12, FOLATE, FERRITIN, TIBC, IRON, RETICCTPCT in the last 72 hours. Sepsis Labs: Recent Labs  Lab 01/24/20 0753 01/24/20 1107 01/25/20 0456 01/25/20 0906 01/25/20 1741 01/26/20 0424  PROCALCITON <0.10  --   --   --   --   --   WBC 11.8*  --  14.6*  --   --  16.8*  LATICACIDVEN  --  2.5*  --  4.5* 3.3*  --     Microbiology Recent Results (from the past 240 hour(s))  Blood culture (routine x 2)     Status: None (Preliminary result)   Collection Time: 01/24/20  9:26 AM   Specimen: BLOOD  Result Value Ref Range Status   Specimen Description BLOOD LEFT ASSIST CONTROL  Final   Special Requests   Final    BOTTLES DRAWN AEROBIC AND ANAEROBIC Blood Culture adequate volume   Culture   Final    NO GROWTH 2 DAYS Performed at [facility], [address], [place], [place] [address]    Report Status PENDING   Incomplete  Blood culture (routine x 2)     Status: None (Preliminary result)   Collection Time: 01/24/20  9:27 AM   Specimen: BLOOD  Result Value Ref Range Status   Specimen Description BLOOD LEFT HAND  Final   Special Requests   Final    BOTTLES DRAWN AEROBIC AND ANAEROBIC Blood Culture results may not be optimal due to an inadequate volume of blood received in culture bottles   Culture   Final    NO GROWTH 2 DAYS Performed at [facility] [address] [place], [place] [address]    Report Status PENDING  Incomplete  SARS Coronavirus 2 by RT PCR (hospital order, performed in [facility] hospital lab) Nasopharyngeal Nasopharyngeal Swab     Status: None   Collection Time: 01/24/20  9:27 AM   Specimen: Nasopharyngeal Swab  Result Value Ref Range Status   SARS Coronavirus 2 NEGATIVE NEGATIVE Final    Comment: (NOTE) SARS-CoV-2 target nucleic acids are NOT DETECTED.  The SARS-CoV-2 RNA is generally detectable in upper and lower respiratory specimens during the acute phase of infection. The lowest concentration of SARS-CoV-2 viral copies this assay can detect is 250 copies / mL. A negative result does not preclude SARS-CoV-2 infection and should not be used as the sole basis for treatment or other patient management decisions.  A negative result may occur with improper specimen collection / handling, submission of specimen other than nasopharyngeal swab, presence of viral mutation(s) within the areas targeted by this assay, and inadequate number of viral copies (<250 copies / mL). A negative result must be combined with clinical observations, patient history, and epidemiological information.  Fact Sheet for Patients:   BoilerBrush.com.cy  Fact Sheet for Healthcare Providers: https://pope.com/  This test is not yet approved or  cleared by the [place] FDA and has been authorized for detection and/or diagnosis of  SARS-CoV-2 by FDA under an Emergency Use Authorization (EUA).  This EUA will remain in effect (meaning this test can be used) for the duration of the COVID-19 declaration under Section 564(b)(1) of the Act, 21 U.S.C. section 360bbb-3(b)(1), unless the authorization is terminated or revoked sooner.  Performed at [facility], [address] [place], [place] [address]   Expectorated sputum assessment w rflx to resp cult     Status: None   Collection Time: 01/24/20  2:46 PM   Specimen: Expectorated Sputum  Result Value Ref Range Status   Specimen Description EXPECTORATED SPUTUM  Final   Special Requests NONE  Final   Sputum evaluation   Final    Sputum specimen not acceptable for testing.  Please recollect.   AMY DALTON RN AT 1639 ON 01/24/20 [facility] Performed at [facility], [address] [place], [place] [address]    Report Status 01/24/2020 FINAL  Final  MRSA PCR Screening     Status: None   Collection Time: 01/24/20  2:46 PM   Specimen: Nasal Mucosa; Nasopharyngeal  Result Value Ref Range Status   MRSA by PCR NEGATIVE NEGATIVE Final    Comment:  The GeneXpert MRSA Assay (FDA approved for NASAL specimens only), is one component of a comprehensive MRSA colonization surveillance program. It is not intended to diagnose MRSA infection nor to guide or monitor treatment for MRSA infections. Performed at [facility], [address] [place], [place] [address]     Procedures and diagnostic studies:  ECHOCARDIOGRAM COMPLETE  Result Date: 01/24/2020    ECHOCARDIOGRAM REPORT   [name] Date of Exam: 01/24/2020 Medical Rec #:  [id number]    Height:       69.0 in Accession #:    [id number]   Weight:       205.3 lb Date of Birth:  03-30-1961    BSA:          2.089 m Patient Age:    59 years     BP:           114/67 mmHg Patient Gender: M            HR:           68 bpm. Exam Location:  [facility] Procedure: 2D Echo, Cardiac Doppler and  Color Doppler Indications:     CHF-Acute Systolic 428.21 / I50.21  History:         Patient has prior history of Echocardiogram examinations.                  Stroke; Risk Factors:Hypertension. MI.  Sonographer:     Neysa Bonito Roar Referring Phys:  Wynona Neat NIU Diagnosing Phys: Adrian Blackwater MD IMPRESSIONS  1. Left ventricular ejection fraction, by estimation, is 25 to 30%. The left ventricle has severely decreased function. The left ventricle demonstrates global hypokinesis. Left ventricular diastolic parameters are consistent with Grade I diastolic dysfunction (impaired relaxation).  2. Right ventricular systolic function is mildly reduced. The right ventricular size is moderately enlarged. There is mildly elevated pulmonary artery systolic pressure.  3. Left atrial size was mild to moderately dilated.  4. Right atrial size was moderately dilated.  5. The mitral valve is degenerative. Mild to moderate mitral valve regurgitation. No evidence of mitral stenosis.  6. The aortic valve is normal in structure. Aortic valve regurgitation is trivial. No aortic stenosis is present.  7. The inferior vena cava is normal in size with greater than 50% respiratory variability, suggesting right atrial pressure of 3 mmHg. Conclusion(s)/Recommendation(s): Findings consistent with ischemic cardiomyopathy. FINDINGS  Left Ventricle: Left ventricular ejection fraction, by estimation, is 25 to 30%. The left ventricle has severely decreased function. The left ventricle demonstrates global hypokinesis. The left ventricular internal cavity size was normal in size. There is no left ventricular hypertrophy. Left ventricular diastolic parameters are consistent with Grade I diastolic dysfunction (impaired relaxation). Right Ventricle: The right ventricular size is moderately enlarged. No increase in right ventricular wall thickness. Right ventricular systolic function is mildly reduced. There is mildly elevated pulmonary artery systolic  pressure. The tricuspid regurgitant velocity is 2.93 m/s, and with an assumed right atrial pressure of 10 mmHg, the estimated right ventricular systolic pressure is 44.3 mmHg. Left Atrium: Left atrial size was mild to moderately dilated. Right Atrium: Right atrial size was moderately dilated. Pericardium: There is no evidence of pericardial effusion. Mitral Valve: The mitral valve is degenerative in appearance. There is moderate thickening of the posterior mitral valve leaflet(s). There is moderate calcification of the posterior mitral valve leaflet(s). Moderately decreased mobility of the mitral valve leaflets. Severe mitral annular calcification. Mild to moderate mitral valve regurgitation.  No evidence of mitral valve stenosis. MV peak gradient, 13.0 mmHg. The mean mitral valve gradient is 6.0 mmHg. Tricuspid Valve: The tricuspid valve is normal in structure. Tricuspid valve regurgitation is mild . No evidence of tricuspid stenosis. Aortic Valve: The aortic valve is normal in structure. Aortic valve regurgitation is trivial. No aortic stenosis is present. Aortic valve mean gradient measures 4.0 mmHg. Aortic valve peak gradient measures 7.1 mmHg. Aortic valve area, by VTI measures 2.83 cm. Pulmonic Valve: The pulmonic valve was normal in structure. Pulmonic valve regurgitation is not visualized. No evidence of pulmonic stenosis. Aorta: The aortic root is normal in size and structure. Venous: The inferior vena cava is normal in size with greater than 50% respiratory variability, suggesting right atrial pressure of 3 mmHg. IAS/Shunts: No atrial level shunt detected by color flow Doppler.  LEFT VENTRICLE PLAX 2D LVIDd:         6.39 cm      Diastology LVIDs:         5.44 cm      LV e' lateral:   8.81 cm/s LV PW:         1.25 cm      LV E/e' lateral: 18.6 LV IVS:        1.45 cm      LV e' medial:    5.44 cm/s LVOT diam:     2.10 cm      LV E/e' medial:  30.1 LV SV:         80 LV SV Index:   38 LVOT Area:     3.46 cm   LV Volumes (MOD) LV vol d, MOD A2C: 174.0 ml LV vol d, MOD A4C: 145.0 ml LV vol s, MOD A2C: 126.0 ml LV vol s, MOD A4C: 101.0 ml LV SV MOD A2C:     48.0 ml LV SV MOD A4C:     145.0 ml LV SV MOD BP:      48.0 ml RIGHT VENTRICLE RV Mid diam:    3.28 cm RV S prime:     11.20 cm/s TAPSE (M-mode): 1.3 cm LEFT ATRIUM              Index       RIGHT ATRIUM           Index LA diam:        4.80 cm  2.30 cm/m  RA Area:     17.70 cm LA Vol (A2C):   95.5 ml  45.71 ml/m RA Volume:   45.10 ml  21.59 ml/m LA Vol (A4C):   101.0 ml 48.35 ml/m LA Biplane Vol: 101.0 ml 48.35 ml/m  AORTIC VALVE                   PULMONIC VALVE AV Area (Vmax):    2.86 cm    PV Vmax:       1.19 m/s AV Area (Vmean):   2.42 cm    PV Peak grad:  5.7 mmHg AV Area (VTI):     2.83 cm AV Vmax:           133.00 cm/s AV Vmean:          99.500 cm/s AV VTI:            0.283 m AV Peak Grad:      7.1 mmHg AV Mean Grad:      4.0 mmHg LVOT Vmax:         110.00 cm/s LVOT Vmean:  69.600 cm/s LVOT VTI:          0.231 m LVOT/AV VTI ratio: 0.82  AORTA Ao Root diam: 2.90 cm MITRAL VALVE                TRICUSPID VALVE MV Area (PHT): 3.27 cm     TR Peak grad:   34.3 mmHg MV Peak grad:  13.0 mmHg    TR Vmax:        293.00 cm/s MV Mean grad:  6.0 mmHg MV Vmax:       1.80 m/s     SHUNTS MV Vmean:      118.0 cm/s   Systemic VTI:  0.23 m MV Decel Time: 232 msec     Systemic Diam: 2.10 cm MV E velocity: 164.00 cm/s MV A velocity: 111.00 cm/s MV E/A ratio:  1.48 MV A Prime:    6.4 cm/s Adrian Blackwater MD Electronically signed by Adrian Blackwater MD Signature Date/Time: 01/24/2020/8:53:51 PM    Final                LOS: 2 days   Audreyana Huntsberry  [facility]     01/26/2020, 2:37 PM

## 2020-01-26 NOTE — Plan of Care (Signed)
  Problem: Education: Goal: Knowledge of General Education information will improve Description Including pain rating scale, medication(s)/side effects and non-pharmacologic comfort measures Outcome: Progressing   

## 2020-01-27 LAB — CBC WITH DIFFERENTIAL/PLATELET
Abs Immature Granulocytes: 0.33 10*3/uL — ABNORMAL HIGH (ref 0.00–0.07)
Basophils Absolute: 0.1 10*3/uL (ref 0.0–0.1)
Basophils Relative: 0 %
Eosinophils Absolute: 0 10*3/uL (ref 0.0–0.5)
Eosinophils Relative: 0 %
HCT: 35.7 % — ABNORMAL LOW (ref 39.0–52.0)
Hemoglobin: 11.7 g/dL — ABNORMAL LOW (ref 13.0–17.0)
Immature Granulocytes: 2 %
Lymphocytes Relative: 13 %
Lymphs Abs: 1.9 10*3/uL (ref 0.7–4.0)
MCH: 29.5 pg (ref 26.0–34.0)
MCHC: 32.8 g/dL (ref 30.0–36.0)
MCV: 90.2 fL (ref 80.0–100.0)
Monocytes Absolute: 1.2 10*3/uL — ABNORMAL HIGH (ref 0.1–1.0)
Monocytes Relative: 9 %
Neutro Abs: 10.7 10*3/uL — ABNORMAL HIGH (ref 1.7–7.7)
Neutrophils Relative %: 76 %
Platelets: 251 10*3/uL (ref 150–400)
RBC: 3.96 MIL/uL — ABNORMAL LOW (ref 4.22–5.81)
RDW: 15.9 % — ABNORMAL HIGH (ref 11.5–15.5)
WBC: 14.1 10*3/uL — ABNORMAL HIGH (ref 4.0–10.5)
nRBC: 0.1 % (ref 0.0–0.2)

## 2020-01-27 LAB — BASIC METABOLIC PANEL
Anion gap: 9 (ref 5–15)
BUN: 47 mg/dL — ABNORMAL HIGH (ref 8–23)
CO2: 30 mmol/L (ref 22–32)
Calcium: 9.5 mg/dL (ref 8.9–10.3)
Chloride: 100 mmol/L (ref 98–111)
Creatinine, Ser: 1.19 mg/dL (ref 0.61–1.24)
GFR calc Af Amer: >60 mL/min (ref >60)
GFR calc non Af Amer: >60 mL/min (ref >60)
Glucose, Bld: 123 mg/dL — ABNORMAL HIGH (ref 70–99)
Potassium: 4.3 mmol/L (ref 3.5–5.1)
Sodium: 139 mmol/L (ref 135–145)

## 2020-01-27 LAB — MAGNESIUM: Magnesium: 2.2 mg/dL (ref 1.7–2.4)

## 2020-01-27 MED ORDER — CEFUROXIME AXETIL 500 MG PO TABS
500.0000 mg | ORAL_TABLET | Freq: Two times a day (BID) | ORAL | Status: DC
  Filled 2020-01-27 (×2): qty 1

## 2020-01-27 MED ORDER — PREDNISONE 20 MG PO TABS: 40.0000 mg | ORAL_TABLET | Freq: Every day | ORAL | 0 refills | Status: AC

## 2020-01-27 MED ORDER — CEFUROXIME AXETIL 500 MG PO TABS: 500.0000 mg | ORAL_TABLET | Freq: Two times a day (BID) | ORAL | 0 refills | Status: AC

## 2020-01-27 MED ORDER — FUROSEMIDE 20 MG PO TABS
20.0000 mg | ORAL_TABLET | Freq: Every day | ORAL | 0 refills | Status: DC
Start: 2020-01-27 — End: 2020-08-01

## 2020-01-27 NOTE — Evaluation (Signed)
Physical Therapy Evaluation Patient Details Name: Carl [name] MRN: [id number] DOB: 1961-01-22 Today's Date: 01/27/2020   History of Present Illness  Pt is a 59 y.o. male presenting to hospital 7/19 with SOB, wheezing, congestion, and LE edema.  Pt admitted with acute on chronic respiratory failure with hypoxia, acute on chronic systolic CHF, COPD exacerbation, and sepsis.  PMH includes acute pulmonary edema, acute respiratory failure with hypoxia, Bipolar 1 disorder, CHF, CKD, COPD, depression, htn, MI, NSTEMI, RVAD, seizures (childhood), stroke, CABG.  Clinical Impression  Prior to hospital admission, pt was independent with ambulation; lives at [facility].  Currently pt is modified independent with bed mobility; independent with transfers; CGA to ambulate 10 feet x2 in room (pt preferring to hold onto furniture in room for stability) and SBA to ambulate 180 feet with RW (L LE external rotation noted--pt reports h/o stroke causing L LE impairments).  Overall pt steady and safe with functional mobility during session.  Pt focusing on needing back brace though d/t reporting it would improve his posture and stability in back/hips (MD notified).  Pt would benefit from skilled PT to address noted impairments and functional limitations (see below for any additional details).  Upon hospital discharge, pt would benefit from OP PT.    Follow Up Recommendations Outpatient PT    Equipment Recommendations   (pt appears appropriate (in terms of balance and safety) to use rollator that pt prefers and has at home already)    Recommendations for Other Services       Precautions / Restrictions Precautions Precautions: Fall Precaution Comments: seizure precautions Restrictions Weight Bearing Restrictions: No      Mobility  Bed Mobility Overal bed mobility: Modified Independent             General bed mobility comments: Semi-supine to/from sitting without any noted  difficulties  Transfers Overall transfer level: Independent Equipment used: None             General transfer comment: steady safe transfers noted  Ambulation/Gait Ambulation/Gait assistance: Min guard;Supervision Gait Distance (Feet):  (10 feet x2 no AD (pt preferring to hold onto furniture in room though); 180 feet with RW) Assistive device: None;Rolling walker (2 wheeled)   Gait velocity: decreased   General Gait Details: step through gait pattern; L LE external rotation noted; pt preferring to hold onto furniture in room and pt preferring using of RW in hallway (steady and safe with both methods)  Stairs            Wheelchair Mobility    Modified Rankin (Stroke Patients Only)       Balance Overall balance assessment: Needs assistance Sitting-balance support: No upper extremity supported;Feet supported Sitting balance-Leahy Scale: Normal Sitting balance - Comments: steady sitting reaching outside BOS   Standing balance support: No upper extremity supported Standing balance-Leahy Scale: Good Standing balance comment: steady standing adjusting sheets on bed                             Pertinent Vitals/Pain Pain Assessment: Faces Faces Pain Scale: No hurt Pain Intervention(s): Limited activity within patient's tolerance;Monitored during session;Repositioned  Vitals (HR and O2 on room air) stable and WFL throughout treatment session.    Home Living Family/patient expects to be discharged to:: Assisted living               Home Equipment: Walker - 4 wheels;Wheelchair - manual Additional Comments: [facility].  Pt reports  ramp to enter.  Difficult to get rest of home set-up details (pt tangential and unable to redirect to answer these questions)    Prior Function Level of Independence: Independent         Comments: Pt reports only 1 fall (about 2-3 weeks ago) in last 6 months (L LE giving out d/t h/o stroke).  Pt reports he tries  not to use 4ww or manual w/c (unless it is a medical emergency).     Hand Dominance   Dominant Hand: Right    Extremity/Trunk Assessment   Upper Extremity Assessment Upper Extremity Assessment: Overall WFL for tasks assessed    Lower Extremity Assessment Lower Extremity Assessment: RLE deficits/detail;LLE deficits/detail RLE Deficits / Details: hip flexion, knee flexion/extension, and DF 4+/5 LLE Deficits / Details: hip flexion 4/5; knee extension/flexion 4/5; DF 4/5    Cervical / Trunk Assessment Cervical / Trunk Assessment:  (mild forward head posture)  Communication   Communication: Other (comment) (Tangential; often repeating need for back brace to improve posture and back stability)  Cognition Arousal/Alertness: Awake/alert Behavior During Therapy: Impulsive Overall Cognitive Status: Within Functional Limits for tasks assessed                                        General Comments   Nursing cleared pt for participation in physical therapy.  Pt agreeable to PT session.    Exercises  Ambulation with RW   Assessment/Plan    PT Assessment Patient needs continued PT services  PT Problem List Decreased strength;Decreased balance;Decreased mobility;Decreased safety awareness       PT Treatment Interventions DME instruction;Gait training;Functional mobility training;Therapeutic activities;Therapeutic exercise;Balance training;Patient/family education    PT Goals (Current goals can be found in the Care Plan section)  Acute Rehab PT Goals Patient Stated Goal: to improve walking PT Goal Formulation: With patient Time For Goal Achievement: 02/10/20 Potential to Achieve Goals: Good    Frequency Min 2X/week   Barriers to discharge        Co-evaluation               AM-PAC PT "6 Clicks" Mobility  Outcome Measure Help needed turning from your back to your side while in a flat bed without using bedrails?: None Help needed moving from lying on  your back to sitting on the side of a flat bed without using bedrails?: None Help needed moving to and from a bed to a chair (including a wheelchair)?: None Help needed standing up from a chair using your arms (e.g., wheelchair or bedside chair)?: None Help needed to walk in hospital room?: A Little Help needed climbing 3-5 steps with a railing? : A Little 6 Click Score: 22    End of Session Equipment Utilized During Treatment: Gait belt Activity Tolerance: Patient tolerated treatment well Patient left: in bed;with call bell/phone within reach;with bed alarm set Nurse Communication: Mobility status;Precautions PT Visit Diagnosis: Other abnormalities of gait and mobility (R26.89);History of falling (Z91.81);Muscle weakness (generalized) (M62.81)    Time: 6063-0160 PT Time Calculation (min) (ACUTE ONLY): 23 min   Charges:   PT Evaluation $PT Eval Low Complexity: 1 Low         Marshe Shrestha, PT 01/27/20, 10:09 AM

## 2020-01-27 NOTE — TOC Initial Note (Signed)
Transition of Care [facility]) - Initial/Assessment Note    Patient Details  Name: Carl [name] MRN: [id number] Date of Birth: Jul 18, 1960  [facility]) CM/SW Contact:    Maree Krabbe, LCSW Phone Number: 01/27/2020, 1:50 PM  Clinical Narrative:     Pt is alert and oriented. Pt is agreeable to outpatient PT. Pt states facility transports him to apts. Referral made to [facility] Outpatient clinic. CSW spoke with facility and they will pick pt up at d/c.             Expected Discharge Plan: Group Home Barriers to Discharge: No Barriers Identified   Patient Goals and CMS Choice Patient states their goals for this hospitalization and ongoing recovery are:: to go back home      Expected Discharge Plan and Services Expected Discharge Plan: Group Home In-house Referral: Clinical Social Work   Post Acute Care Choice: NA Living arrangements for the past 2 months: Group Home Expected Discharge Date: 01/27/20                                    Prior Living Arrangements/Services Living arrangements for the past 2 months: Group Home Lives with:: Self Patient language and need for interpreter reviewed:: Yes Do you feel safe going back to the place where you live?: Yes      Need for Family Participation in Patient Care: Yes (Comment) Care giver support system in place?: Yes (comment)   Criminal Activity/Legal Involvement Pertinent to Current Situation/Hospitalization: No - Comment as needed  Activities of Daily Living Home Assistive Devices/Equipment: Walker (specify type), Eyeglasses ADL Screening (condition at time of admission) Patient's cognitive ability adequate to safely complete daily activities?: Yes Is the patient deaf or have difficulty hearing?: No Does the patient have difficulty seeing, even when wearing glasses/contacts?: No Does the patient have difficulty concentrating, remembering, or making decisions?: No Patient able to express need for assistance with  ADLs?: Yes Does the patient have difficulty dressing or bathing?: No Independently performs ADLs?: Yes (appropriate for developmental age) Does the patient have difficulty walking or climbing stairs?: No Weakness of Legs: None Weakness of Arms/Hands: None  Permission Sought/Granted Permission sought to share information with : Guardian (DSS)                Emotional Assessment Appearance:: Appears stated age Attitude/Demeanor/Rapport: Engaged Affect (typically observed): Accepting, Appropriate Orientation: : Oriented to Self, Oriented to Place, Oriented to  Time, Oriented to Situation Alcohol / Substance Use: Not Applicable Psych Involvement: No (comment)  Admission diagnosis:  COPD exacerbation (HCC) [J44.1] Acute on chronic systolic (congestive) heart failure (HCC) [I50.23] Acute on chronic congestive heart failure, unspecified heart failure type [facility]) [I50.9] Patient Active Problem List   Diagnosis Date Noted  . HCAP (healthcare-associated pneumonia) 01/24/2020  . Hyperkalemia 01/24/2020  . Acute on chronic systolic (congestive) heart failure (HCC) 01/24/2020  . Seizure (HCC) 01/24/2020  . HLD (hyperlipidemia) 01/24/2020  . Ascites due to alcoholic cirrhosis (HCC)   . Acute on chronic systolic CHF (congestive heart failure) (HCC) 11/18/2019  . 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 Elevated troponin 11/18/2019  . Hypoxia 11/18/2019  . Acute renal failure superimposed on stage 3a chronic kidney disease (HCC) 11/18/2019  . Coronary artery disease 05/15/2019  . Essential hypertension 05/15/2019  . History of stroke 05/15/2019  . Homicidal ideation 05/15/2019  . Hypothyroidism 05/15/2019  . COPD exacerbation (HCC) 03/22/2019  .  Bipolar 1 disorder (HCC) 12/23/2018  . Acute on chronic respiratory failure with hypoxia (HCC) 11/03/2018   PCP:  [facility]:   [facility] - [place], [place] - [address] [place] [address] Phone: [phone] Fax: [phone]     Social Determinants of Health (SDOH) Interventions    Readmission Risk Interventions Readmission Risk Prevention Plan 11/03/2018  Transportation Screening Complete  PCP or Specialist Appt within 5-7 Days Not Complete  Not Complete comments still in ICU  Home Care Screening Complete  Medication Review (RN CM) Complete

## 2020-01-27 NOTE — Discharge Summary (Signed)
Physician Discharge Summary  Carl [name] WJX:[id number] DOB: August 01, 1960 DOA: 01/24/2020  PCP: Carl [name], [facility]  Admit date: 01/24/2020 Discharge date: 01/27/2020  Discharge disposition: Group home   Recommendations for Outpatient Follow-Up:   Outpatient follow-up with PCP in 1 week Outpatient follow-up at heart failure clinic as scheduled.   Discharge Diagnosis:   Principal Problem:   Acute on chronic respiratory failure with hypoxia (HCC) Active Problems:   Bipolar 1 disorder (HCC)   COPD exacerbation (HCC)   Coronary artery disease   Essential hypertension   History of stroke   Hypothyroidism   Acute renal failure superimposed on stage 3a chronic kidney disease (HCC)   Hyperkalemia   Acute on chronic systolic (congestive) heart failure (HCC)   Seizure (HCC)   HLD (hyperlipidemia)    Discharge Condition: Stable.  Diet recommendation:  Diet Order            Diet - low sodium heart healthy           Diet 2 gram sodium Room service appropriate? Yes; Fluid consistency: Thin  Diet effective now                   Code Status: Full Code     Hospital Course:   [name] is a 59 y.o. male with medical history ofhypertension, hyperlipidemia, COPD, CVA, hypothyroidism, bipolar disorder, depression, tobacco use disorder seizure, RVAD (right ventricular assistant device present), CAD, CABG, CKD stage 3a, alcohol abuse, AICD placement,sCHF with EF 25-30%,on Eliquis,who presented to the ED on 01/24/20 with progressive shortness of breath, productive cough and lower extremity edema. In the ED, oxygen saturation 82--89% on room air but imporved to 94% on 2L/min oxygen.  He was treated with empiric IV antibiotics for sepsis secondary to community-acquired pneumonia.  He was also treated with IV Lasix for acute exacerbation of chronic systolic and diastolic CHF and also received prednisone for COPD exacerbation.  His condition has improved and he has been  successfully weaned off of oxygen.  He had acute kidney injury on admission but this has improved with diuretics. Echo on 7/19 showed EF 25-30% with LV global hypokinesis, grade 1 diastolic dysfunction, mild-mod MR (unchanged from prior Echo).  His condition has improved and he is deemed stable for discharge to home today.      Discharge Exam:   Vitals:   01/27/20 0600 01/27/20 0746  BP:  121/83  Pulse:  (!) 54  Resp: 20   Temp:  97.7 F (36.5 C)  SpO2:  99%   Vitals:   01/26/20 2149 01/27/20 0438 01/27/20 0600 01/27/20 0746  BP: (!) 118/44 118/68  121/83  Pulse: 81 65  (!) 54  Resp: Temp: 98.7 F (37.1 C) 98.1 F (36.7 C)  97.7 F (36.5 C)  TempSrc:  Oral  Oral  SpO2: 100% 96%  99%  Weight:  91.8 kg    Height:         GEN: NAD SKIN: No rash EYES: EOMI ENT: MMM, poor dentition CV: RRR PULM: Mild bilateral wheezing, no rales heard. ABD: soft, ND, NT, +BS CNS: AAO x 3, non focal EXT: No edema or tenderness   The results of significant diagnostics from this hospitalization (including imaging, microbiology, ancillary and laboratory) are listed below for reference.     Procedures and Diagnostic Studies:   DG Chest Portable 1 View  Result Date: 01/24/2020 CLINICAL DATA:  Shortness of breath. EXAM: PORTABLE CHEST 1 VIEW COMPARISON:  11/18/2019 FINDINGS: The pacer wires are stable. Stable surgical changes from bypass surgery and valve replacement surgery. Stable cardiac enlargement and age advanced atherosclerotic calcification involving the thoracic aorta. Mild vascular congestion but no overt pulmonary edema or pleural effusions. New airspace opacity in the left mid lung suspicious for pneumonia. IMPRESSION: New left mid lung airspace opacity suspicious for pneumonia. Electronically Signed   By: Rudie Meyer M.D.   On: 01/24/2020 08:36   ECHOCARDIOGRAM COMPLETE  Result Date: 01/24/2020    ECHOCARDIOGRAM REPORT   [name] Date of  Exam: 01/24/2020 Medical Rec #:  [id number]    Height:       69.0 in Accession #:    [id number]   Weight:       205.3 lb Date of Birth:  [birth year]    BSA:          2.089 m Patient Age:    59 years     BP:           114/67 mmHg Patient Gender: M            HR:           68 bpm. Exam Location:  [facility] Procedure: 2D Echo, Cardiac Doppler and Color Doppler Indications:     CHF-Acute Systolic 428.21 / I50.21  History:         Patient has prior history of Echocardiogram examinations.                  Stroke; Risk Factors:Hypertension. MI.  Sonographer:     Carl [name] Referring Phys:  Carl [name] NIU Diagnosing Phys: Carl Blackwater MD IMPRESSIONS  1. Left ventricular ejection fraction, by estimation, is 25 to 30%. The left ventricle has severely decreased function. The left ventricle demonstrates global hypokinesis. Left ventricular diastolic parameters are consistent with Grade I diastolic dysfunction (impaired relaxation).  2. Right ventricular systolic function is mildly reduced. The right ventricular size is moderately enlarged. There is mildly elevated pulmonary artery systolic pressure.  3. Left atrial size was mild to moderately dilated.  4. Right atrial size was moderately dilated.  5. The mitral valve is degenerative. Mild to moderate mitral valve regurgitation. No evidence of mitral stenosis.  6. The aortic valve is normal in structure. Aortic valve regurgitation is trivial. No aortic stenosis is present.  7. The inferior vena cava is normal in size with greater than 50% respiratory variability, suggesting right atrial pressure of 3 mmHg. Conclusion(s)/Recommendation(s): Findings consistent with ischemic cardiomyopathy. FINDINGS  Left Ventricle: Left ventricular ejection fraction, by estimation, is 25 to 30%. The left ventricle has severely decreased function. The left ventricle demonstrates global hypokinesis. The left ventricular internal cavity size was normal in size. There is no left ventricular hypertrophy.  Left ventricular diastolic parameters are consistent with Grade I diastolic dysfunction (impaired relaxation). Right Ventricle: The right ventricular size is moderately enlarged. No increase in right ventricular wall thickness. Right ventricular systolic function is mildly reduced. There is mildly elevated pulmonary artery systolic pressure. The tricuspid regurgitant velocity is 2.93 m/s, and with an assumed right atrial pressure of 10 mmHg, the estimated right ventricular systolic pressure is 44.3 mmHg. Left Atrium: Left atrial size was mild to moderately dilated. Right Atrium: Right atrial size was moderately dilated. Pericardium: There is no evidence of pericardial effusion. Mitral Valve: The mitral valve is degenerative in appearance. There is moderate thickening of the posterior mitral valve leaflet(s). There is moderate calcification of the posterior mitral valve  leaflet(s). Moderately decreased mobility of the mitral valve leaflets. Severe mitral annular calcification. Mild to moderate mitral valve regurgitation. No evidence of mitral valve stenosis. MV peak gradient, 13.0 mmHg. The mean mitral valve gradient is 6.0 mmHg. Tricuspid Valve: The tricuspid valve is normal in structure. Tricuspid valve regurgitation is mild . No evidence of tricuspid stenosis. Aortic Valve: The aortic valve is normal in structure. Aortic valve regurgitation is trivial. No aortic stenosis is present. Aortic valve mean gradient measures 4.0 mmHg. Aortic valve peak gradient measures 7.1 mmHg. Aortic valve area, by VTI measures 2.83 cm. Pulmonic Valve: The pulmonic valve was normal in structure. Pulmonic valve regurgitation is not visualized. No evidence of pulmonic stenosis. Aorta: The aortic root is normal in size and structure. Venous: The inferior vena cava is normal in size with greater than 50% respiratory variability, suggesting right atrial pressure of 3 mmHg. IAS/Shunts: No atrial level shunt detected by color flow Doppler.   LEFT VENTRICLE PLAX 2D LVIDd:         6.39 cm      Diastology LVIDs:         5.44 cm      LV e' lateral:   8.81 cm/s LV PW:         1.25 cm      LV E/e' lateral: 18.6 LV IVS:        1.45 cm      LV e' medial:    5.44 cm/s LVOT diam:     2.10 cm      LV E/e' medial:  30.1 LV SV:         80 LV SV Index:   38 LVOT Area:     3.46 cm  LV Volumes (MOD) LV vol d, MOD A2C: 174.0 ml LV vol d, MOD A4C: 145.0 ml LV vol s, MOD A2C: 126.0 ml LV vol s, MOD A4C: 101.0 ml LV SV MOD A2C:     48.0 ml LV SV MOD A4C:     145.0 ml LV SV MOD BP:      48.0 ml RIGHT VENTRICLE RV Mid diam:    3.28 cm RV S prime:     11.20 cm/s TAPSE (M-mode): 1.3 cm LEFT ATRIUM              Index       RIGHT ATRIUM           Index LA diam:        4.80 cm  2.30 cm/m  RA Area:     17.70 cm LA Vol (A2C):   95.5 ml  45.71 ml/m RA Volume:   45.10 ml  21.59 ml/m LA Vol (A4C):   101.0 ml 48.35 ml/m LA Biplane Vol: 101.0 ml 48.35 ml/m  AORTIC VALVE                   PULMONIC VALVE AV Area (Vmax):    2.86 cm    PV Vmax:       1.19 m/s AV Area (Vmean):   2.42 cm    PV Peak grad:  5.7 mmHg AV Area (VTI):     2.83 cm AV Vmax:           133.00 cm/s AV Vmean:          99.500 cm/s AV VTI:            0.283 m AV Peak Grad:      7.1 mmHg AV Mean Grad:  4.0 mmHg LVOT Vmax:         110.00 cm/s LVOT Vmean:        69.600 cm/s LVOT VTI:          0.231 m LVOT/AV VTI ratio: 0.82  AORTA Ao Root diam: 2.90 cm MITRAL VALVE                TRICUSPID VALVE MV Area (PHT): 3.27 cm     TR Peak grad:   34.3 mmHg MV Peak grad:  13.0 mmHg    TR Vmax:        293.00 cm/s MV Mean grad:  6.0 mmHg MV Vmax:       1.80 m/s     SHUNTS MV Vmean:      118.0 cm/s   Systemic VTI:  0.23 m MV Decel Time: 232 msec     Systemic Diam: 2.10 cm MV E velocity: 164.00 cm/s MV A velocity: 111.00 cm/s MV E/A ratio:  1.48 MV A Prime:    6.4 cm/s Carl Blackwater MD Electronically signed by Carl Blackwater MD Signature Date/Time: 01/24/2020/8:53:51 PM    Final      Labs:   Basic Metabolic Panel: Recent  Labs  Lab 01/24/20 0753 01/24/20 0753 01/25/20 0456 01/25/20 0456 01/26/20 0424 01/27/20 0514  NA 136  --  138  --  138 139  K 5.2*   < > 4.1   < > 4.2 4.3  CL 104  --  103  --  101 100  CO2 22  --  24  --  28 30  GLUCOSE 168*  --  211*  --  147* 123*  BUN 23  --  35*  --  46* 47*  CREATININE 1.68*  --  1.65*  --  1.34* 1.19  CALCIUM 8.6*  --  8.8*  --  9.4 9.5  MG  --   --  1.9  --  2.0 2.2   < > = values in this interval not displayed.   GFR Estimated Creatinine Clearance: 72.9 mL/min (by C-G formula based on SCr of 1.19 mg/dL). Liver Function Tests: Recent Labs  Lab 01/24/20 0753  AST 35  ALT 24  ALKPHOS 102  BILITOT 0.7  PROT 7.6  ALBUMIN 3.7   No results for input(s): LIPASE, AMYLASE in the last 168 hours. No results for input(s): AMMONIA in the last 168 hours. Coagulation profile No results for input(s): INR, PROTIME in the last 168 hours.  CBC: Recent Labs  Lab 01/24/20 0753 01/25/20 0456 01/26/20 0424 01/27/20 0514  WBC 11.8* 14.6* 16.8* 14.1*  NEUTROABS 9.5*  --   --  10.7*  HGB 13.0 11.5* 11.4* 11.7*  HCT 39.1 34.8* 32.9* 35.7*  MCV 89.7 90.6 87.7 90.2  PLT 249 207 230 251   Cardiac Enzymes: No results for input(s): CKTOTAL, CKMB, CKMBINDEX, TROPONINI in the last 168 hours. BNP: Invalid input(s): POCBNP CBG: Recent Labs  Lab 01/25/20 2115  GLUCAP 252*   D-Dimer No results for input(s): DDIMER in the last 72 hours. Hgb A1c No results for input(s): HGBA1C in the last 72 hours. Lipid Profile No results for input(s): CHOL, HDL, LDLCALC, TRIG, CHOLHDL, LDLDIRECT in the last 72 hours. Thyroid function studies No results for input(s): TSH, T4TOTAL, T3FREE, THYROIDAB in the last 72 hours.  Invalid input(s): FREET3 Anemia work up No results for input(s): VITAMINB12, FOLATE, FERRITIN, TIBC, IRON, RETICCTPCT in the last 72 hours. Microbiology Recent Results (from the past 240 hour(s))  Blood  culture (routine x 2)     Status: None (Preliminary  result)   Collection Time: 01/24/20  9:26 AM   Specimen: BLOOD  Result Value Ref Range Status   Specimen Description BLOOD LEFT ASSIST CONTROL  Final   Special Requests   Final    BOTTLES DRAWN AEROBIC AND ANAEROBIC Blood Culture adequate volume   Culture   Final    NO GROWTH 3 DAYS Performed at [facility], [address] [place], [place] [address]    Report Status PENDING  Incomplete  Blood culture (routine x 2)     Status: None (Preliminary result)   Collection Time: 01/24/20  9:27 AM   Specimen: BLOOD  Result Value Ref Range Status   Specimen Description BLOOD LEFT HAND  Final   Special Requests   Final    BOTTLES DRAWN AEROBIC AND ANAEROBIC Blood Culture results may not be optimal due to an inadequate volume of blood received in culture bottles   Culture   Final    NO GROWTH 3 DAYS Performed at [facility], [address] [place], [place] [address]    Report Status PENDING  Incomplete  SARS Coronavirus 2 by RT PCR (hospital order, performed in [facility] hospital lab) Nasopharyngeal Nasopharyngeal Swab     Status: None   Collection Time: 01/24/20  9:27 AM   Specimen: Nasopharyngeal Swab  Result Value Ref Range Status   SARS Coronavirus 2 NEGATIVE NEGATIVE Final    Comment: (NOTE) SARS-CoV-2 target nucleic acids are NOT DETECTED.  The SARS-CoV-2 RNA is generally detectable in upper and lower respiratory specimens during the acute phase of infection. The lowest concentration of SARS-CoV-2 viral copies this assay can detect is 250 copies / mL. A negative result does not preclude SARS-CoV-2 infection and should not be used as the sole basis for treatment or other patient management decisions.  A negative result may occur with improper specimen collection / handling, submission of specimen other than nasopharyngeal swab, presence of viral mutation(s) within the areas targeted by this assay, and inadequate number of viral copies (<250 copies / mL). A  negative result must be combined with clinical observations, patient history, and epidemiological information.  Fact Sheet for Patients:   BoilerBrush.com.cy  Fact Sheet for Healthcare Providers: https://pope.com/  This test is not yet approved or  cleared by the [place] FDA and has been authorized for detection and/or diagnosis of SARS-CoV-2 by FDA under an Emergency Use Authorization (EUA).  This EUA will remain in effect (meaning this test can be used) for the duration of the COVID-19 declaration under Section 564(b)(1) of the Act, 21 U.S.C. section 360bbb-3(b)(1), unless the authorization is terminated or revoked sooner.  Performed at [facility], [address] [place], [place] [address]   Expectorated sputum assessment w rflx to resp cult     Status: None   Collection Time: 01/24/20  2:46 PM   Specimen: Expectorated Sputum  Result Value Ref Range Status   Specimen Description EXPECTORATED SPUTUM  Final   Special Requests NONE  Final   Sputum evaluation   Final    Sputum specimen not acceptable for testing.  Please recollect.   AMY DALTON RN AT 1639 ON 01/24/20 [facility] Performed at [facility], [address], [place], [place] [address]    Report Status 01/24/2020 FINAL  Final  MRSA PCR Screening     Status: None   Collection Time: 01/24/20  2:46 PM   Specimen: Nasal Mucosa; Nasopharyngeal  Result Value Ref Range Status   MRSA by PCR NEGATIVE NEGATIVE Final    Comment:        The GeneXpert MRSA Assay (FDA approved for NASAL specimens only), is one component of a comprehensive MRSA colonization surveillance program. It is not intended to diagnose MRSA infection nor to guide or monitor treatment for MRSA infections. Performed at [facility], [address] [place], [place] [address]      Discharge Instructions:   Discharge Instructions    AMB referral to CHF clinic   Complete  by: As directed    Diet - low sodium heart healthy   Complete by: As directed    Discharge instructions   Complete by: As directed    Stop smoking cigarettes   Increase activity slowly   Complete by: As directed      Allergies as of 01/27/2020      Reactions   Iodinated Diagnostic Agents Shortness Of Breath   Penicillins Anaphylaxis, Shortness Of Breath   Respiratory  Tolerated cefuroxime on 01/06/16   Strawberry Extract Anaphylaxis   Cefepime Itching   Empiric antibiotic, developed pruritis.    Erythromycin Itching   Sulfa Antibiotics Itching, Nausea And Vomiting, Nausea Only      Medication List    TAKE these medications   acetaminophen 500 MG tablet Commonly known as: TYLENOL Take 500 mg by mouth every 6 (six) hours as needed for mild pain.   albuterol 108 (90 Base) MCG/ACT inhaler Commonly known as: VENTOLIN HFA Inhale 2 puffs into the lungs every 6 (six) hours as needed for wheezing.   apixaban 5 MG Tabs tablet Commonly known as: Eliquis Take 1 tablet (5 mg total) by mouth 2 (two) times daily.   carvedilol 6.25 MG tablet Commonly known as: COREG Take 1 tablet (6.25 mg total) by mouth 2 (two) times daily.   cefUROXime 500 MG tablet Commonly known as: CEFTIN Take 1 tablet (500 mg total) by mouth 2 (two) times daily with a meal for 3 days.   divalproex 500 MG 24 hr tablet Commonly known as: DEPAKOTE ER Take 1 tablet (500 mg total) by mouth 2 (two) times daily.   escitalopram 10 MG tablet Commonly known as: LEXAPRO Take 1 tablet (10 mg total) by mouth at bedtime.   Fluticasone-Salmeterol 250-50 MCG/DOSE Aepb Commonly known as: Advair Diskus Inhale 1 puff into the lungs 2 (two) times daily.   furosemide 20 MG tablet Commonly known as: Lasix Take 1 tablet (20 mg total) by mouth daily. What changed:   when to take this  reasons to take this   levothyroxine 25 MCG tablet Commonly known as: SYNTHROID Take 1 tablet (25 mcg total) by mouth daily.     lisinopril 2.5 MG tablet Commonly known as: ZESTRIL Take 1 tablet (2.5 mg total) by mouth daily.   nicotine 21 mg/24hr patch Commonly known as: NICODERM CQ - dosed in mg/24 hours Place 1 patch (21 mg total) onto the skin daily.   omeprazole 20 MG capsule Commonly known as: PRILOSEC Take 20 mg by mouth daily.   polyethylene glycol powder 17 GM/SCOOP powder Commonly known as: GLYCOLAX/MIRALAX Take 17 g (1 packet mixed in 4-8 oz of liquid)  by mouth daily as needed for moderate constipation.   predniSONE 20 MG tablet Commonly known as: DELTASONE Take 2 tablets (40 mg total) by mouth daily with breakfast for 3 days. Start taking on: January 28, 2020   QUEtiapine 100 MG tablet Commonly known as: SEROQUEL Take  100 mg by mouth at bedtime.   QUEtiapine 50 MG tablet Commonly known as: SEROQUEL Take 50 mg by mouth in the morning.   simvastatin 40 MG tablet Commonly known as: ZOCOR Take 40 mg by mouth at bedtime.   Spiriva HandiHaler 18 MCG inhalation capsule Generic drug: tiotropium Place 1 capsule (18 mcg total) into inhaler and inhale daily.       Follow-up Information    [facility] Follow up on 03/01/2020.   Specialty: Cardiology Why: at 9:00am. Enter through the Medical Mall entrance Contact information: [address] [place] [address] [phone]               Time coordinating discharge: 32 minutes  Signed:  Lurene Shadow  [facility] 01/27/2020, 11:52 AM

## 2020-01-27 NOTE — Plan of Care (Signed)
Pt progressing toward goal, slept well, no prn given, VSS. Denies any pain all night.

## 2020-01-28 ENCOUNTER — Telehealth: Payer: Self-pay | Admitting: Family

## 2020-01-28 NOTE — Telephone Encounter (Signed)
Spoke to patients cargiver who said patient is doing well since he was discharged from the hospital. He is checking his weight daily, following a low sodium diet, staying active as much as he can with no complaints or symptoms. They confirmed his follow up CHF Clinic appointment with [place] on 8/25.   Allie Ousley, NT

## 2020-01-29 LAB — CULTURE, BLOOD (ROUTINE X 2)
Culture: NO GROWTH
Culture: NO GROWTH
Special Requests: ADEQUATE

## 2020-02-22 NOTE — Telephone Encounter (Addendum)
I called and spoke with Sela Hua, Social Worker for the patient. She states that they will give approval for the patient to have an upgrade of his device with Dr. Graciela Husbands. They need consents and documentation faxed to them for sign off and coordination of care. I advised we do not have the consents for the procedure in the office, but I will fax her a copy of Dr. Odessa Fleming most recent office note to her.  [facility] - DSS  AttnSela Hua Fax: [phone]   The patient is due to see Gillian Shields, NP on 02/28/20. I have advised Charlene that due to the spike in COVID cases, Dr. Graciela Husbands wanted to delay the patient's procedure until ~ November as this is not urgent.   Charlene advised I will need to coordinate a date for the procedure with the patient's caretaker on Monday 8/23 as she will be at that appointment.   I advised Westley Hummer I will talk with the patient's care taker on Monday and coordinate a date for his device upgrade to be done. I will then fax notes to her from Dr. Odessa Fleming visit and Caitlin's visit.   The patient will need an updated H & P closer to the time of his procedure.  I would like to plan on:  1) 05/02/20 >> OV with Dr. Graciela Husbands (would obtain labs/ COVID testing that day)  - 05/05/20>> upgrade of his device or  2) 05/04/20>> OV with Dr. Graciela Husbands (would obtain labs/ COVID testing that day)  - 05/08/20>> upgrade of his device

## 2020-02-24 ENCOUNTER — Ambulatory Visit: Payer: Medicaid Other | Admitting: Family

## 2020-02-28 ENCOUNTER — Ambulatory Visit (INDEPENDENT_AMBULATORY_CARE_PROVIDER_SITE_OTHER): Payer: Medicaid Other | Admitting: Family

## 2020-02-28 ENCOUNTER — Encounter: Payer: Self-pay | Admitting: Family

## 2020-02-28 ENCOUNTER — Other Ambulatory Visit: Payer: Self-pay

## 2020-02-28 VITALS — BP 120/68 | HR 63 | Ht 69.0 in | Wt 210.8 lb

## 2020-02-28 DIAGNOSIS — E785 Hyperlipidemia, unspecified: Secondary | ICD-10-CM

## 2020-02-28 DIAGNOSIS — I480 Paroxysmal atrial fibrillation: Secondary | ICD-10-CM

## 2020-02-28 DIAGNOSIS — I255 Ischemic cardiomyopathy: Secondary | ICD-10-CM

## 2020-02-28 DIAGNOSIS — I5042 Chronic combined systolic (congestive) and diastolic (congestive) heart failure: Secondary | ICD-10-CM | POA: Diagnosis not present

## 2020-02-28 DIAGNOSIS — Z720 Tobacco use: Secondary | ICD-10-CM

## 2020-02-28 DIAGNOSIS — Z7901 Long term (current) use of anticoagulants: Secondary | ICD-10-CM

## 2020-02-28 DIAGNOSIS — I25118 Atherosclerotic heart disease of native coronary artery with other forms of angina pectoris: Secondary | ICD-10-CM

## 2020-02-28 DIAGNOSIS — I10 Essential (primary) hypertension: Secondary | ICD-10-CM | POA: Diagnosis not present

## 2020-02-28 MED ORDER — ENTRESTO 24-26 MG PO TABS: 1.0000 | ORAL_TABLET | Freq: Two times a day (BID) | ORAL | 6 refills | Status: DC

## 2020-02-28 NOTE — Patient Instructions (Addendum)
Medication Instructions:  Your physician has recommended you make the following change in your medication:   STOP Lisinopril  AFTER 3 DAYS  START Entresto 24-26mg  twice daily  *If you need a refill on your cardiac medications before your next appointment, please call your pharmacy*  Lab Work: Your provider recommends lab work today: CBC, BMP  If you have labs (blood work) drawn today and your tests are completely normal, you will receive your results only by:  MyChart Message (if you have MyChart) OR  A paper copy in the mail If you have any lab test that is abnormal or we need to change your treatment, we wil call you to review the results.   Testing/Procedures: 1) Your EKG today was stable compared to previous.   2) Device upgrade with Dr. Graciela Husbands at [facility] in [place] - will be scheduled for Friday 05/05/20 (arrive at 7:30 am for a 9:30 am case) - Dr. Odessa Fleming nurse, Herbert Seta, will fax a detailed letter of instructions  Follow-Up: At [facility], you and your health needs are our priority.  As part of our continuing mission to provide you with exceptional heart care, we have created designated Provider Care Teams.  These Care Teams include your primary Cardiologist (physician) and Advanced Practice Providers (APPs -  Physician Assistants and Nurse Practitioners) who all work together to provide you with the care you need, when you need it.  We recommend signing up for the patient portal called "MyChart".  Sign up information is provided on this After Visit Summary.  MyChart is used to connect with patients for Virtual Visits (Telemedicine).  Patients are able to view lab/test results, encounter notes, upcoming appointments, etc.  Non-urgent messages can be sent to your provider as well.   To learn more about what you can do with MyChart, go to [email or web address].    Your next appointment:   1) In 1 month with Dr. Kirke Corin or APP  2) On Tuesday 05/02/20 with  Dr. Graciela Husbands (to get an updated History & Phyiscal prior to your procedure - you will need pre-procedure labs & a COVID swab that day  Other Instructions  Recommend checking blood pressure once per day at least 2 hours after your medications and keeping a log. If blood pressure is consistently less than 100/60 or if there is new lightheadedness, dizziness, near-syncope, or syncope - please call our office  Recommend less than 2L of fluid per day (about 4 sixteen ounce bottles).

## 2020-02-28 NOTE — Progress Notes (Signed)
Office Visit    Patient Name: Carl [name] Date of Encounter: 02/28/2020  Primary Care Provider:  Almetta Lovely, [facility] Cardiologist:  Lorine Bears, MD Electrophysiologist:  [name], MD   Chief Complaint    Carl [name] is a 59 y.o. male with a hx of CAD s/p CABG 2017 with MV repair and PFO closure, ischemic heart disease, CVA, Metronic ICD DUAL chamber, COPD, HTN, LBBB, PAF presents today for follow up of heart failure.   Past Medical History    Past Medical History:  Diagnosis Date  . Acute pulmonary edema (HCC) 2017  . Acute respiratory failure with hypoxia (HCC) 2017  . Bipolar 1 disorder (HCC)   . CHF (congestive heart failure) (HCC)   . Chronic kidney disease   . COPD (chronic obstructive pulmonary disease) (HCC)   . Depression   . Hypertension   . Myocardial infarction (HCC)   . NSTEMI (non-ST elevated myocardial infarction) (HCC)   . 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 Seizures (HCC)    childhood  . Stroke (HCC)   . Tobacco abuse    Past Surgical History:  Procedure Laterality Date  . CORONARY ARTERY BYPASS GRAFT      Allergies  Allergies  Allergen Reactions  . Iodinated Diagnostic Agents Shortness Of Breath  . Penicillins Anaphylaxis and Shortness Of Breath    Respiratory  Tolerated cefuroxime on 01/06/16  . Strawberry Extract Anaphylaxis  . Cefepime Itching    Empiric antibiotic, developed pruritis.   . Erythromycin Itching  . Sulfa Antibiotics Itching, Nausea And Vomiting and Nausea Only    History of Present Illness    Carl [name] is a 59 y.o. male with a hx of  CAD s/p CABG 2017 with MV repair and PFO closure, ischemic cardiomyopathy/combined systolic and diastolic heart failure, CVA, Metronic ICD DUAL chamber, COPD, HTN, LBBB, PAF on Eliquis, tobacco use, thyroid disease. He was last seen 01/13/20 by Dr. Graciela Husbands.  He was hospitalized at [facility] 11/18/2019 for acute respiratory distress in setting of  COPD and heart failure exacerbation.  Treated with steroid, empiric antibiotics, bronchodilator, IV diuresis at 10.7 L.  Echo during admission with EF 25-30%.  He previously followed with [facility] cardiology.  He has since established with Dr. Kirke Corin, [facility] heart failure clinic, and Dr. Graciela Husbands.  When seen by Dr. Kirke Corin 12/02/2019 for hospital follow-up he was recommended for [facility].  He was referred to Dr. Graciela Husbands for consideration of upgrade to CRT device.  Lexiscan Myoview 12/08/2019 showed evidence of prior infarct with no significant ischemia -large defect of severe severity present in mid anterior, mid inferior, mid inferolateral, apical anterior, apical inferior and apex location with no significant reversibility and EF 23%.  He was readmitted 01/24/2020 to 01/27/2020 treated with IV antibiotics for sepsis secondary to community-acquired pneumonia.  Also given IV Lasix for acute CHF exacerbation and prednisone for COPD exacerbation.  Repeat echocardiogram 01/24/2020 with EF 25-30%, grade 1 diastolic dysfunction, mild to moderate MR unchanged compared to previous.  Lives at [facility]. Presents today with his caretaker.   His chief complaint today is lower back pain that radiates up to his neck.  Per discussion with his caretaker Medicaid only pays for 3 PT visits per year so he has not been recommended for therapy.  He uses Tylenol 500 mg as needed with some relief but tries not to use frequently.  We discussed stretching regimen.  Reports no chest pain, pressure, tightness.  Reports no shortness of breath at rest.  Endorses continued dyspnea on exertion.  Reports no lower extremity edema, orthopnea, PND.  Tells me he does not sleep well at night due to his back and neck pain.  He reports no melena, hematuria.  We reviewed recent echocardiogram with continued low EF.  We discussed transition from lisinopril to [facility].  He presently has his blood pressure checked once per month  at his facility and we discussed need to check daily along with daily weight.  He is hopeful to have his device upgraded by Dr. Graciela Husbands for optimal care.  EKGs/Labs/Other Studies Reviewed:   The following studies were reviewed today:  Echo 01/24/20 1. Left ventricular ejection fraction, by estimation, is 25 to 30%. The  left ventricle has severely decreased function. The left ventricle  demonstrates global hypokinesis. Left ventricular diastolic parameters are  consistent with Grade I diastolic  dysfunction (impaired relaxation).   2. Right ventricular systolic function is mildly reduced. The right  ventricular size is moderately enlarged. There is mildly elevated  pulmonary artery systolic pressure.   3. Left atrial size was mild to moderately dilated.   4. Right atrial size was moderately dilated.   5. The mitral valve is degenerative. Mild to moderate mitral valve  regurgitation. No evidence of mitral stenosis.   6. The aortic valve is normal in structure. Aortic valve regurgitation is  trivial. No aortic stenosis is present.   7. The inferior vena cava is normal in size with greater than 50%  respiratory variability, suggesting right atrial pressure of 3 mmHg.   VAS [place] LE ABI 01/07/20 Right: Resting right ankle-brachial index is within normal range. No  evidence of significant right lower extremity arterial disease. The right  toe-brachial index is normal.   Left: Resting left ankle-brachial index is within normal range. No  evidence of significant left lower extremity arterial disease. The left  toe-brachial index is abnormal.   Echo 11/18/19  1. Left ventricular ejection fraction, by estimation, is 25 to 30%. The  left ventricle has moderate to severely decreased function. The left  ventricle demonstrates global hypokinesis. The left ventricular internal  cavity size was moderately dilated.  Left ventricular diastolic parameters were normal.   2. Right ventricular systolic  function is normal. The right ventricular  size is mildly enlarged.   3. Left atrial size was mildly dilated.   4. Right atrial size was mildly dilated.   5. The mitral valve is normal in structure. No evidence of mitral valve  regurgitation.   6. The aortic valve is grossly normal. Aortic valve regurgitation is not  visualized.   EKG:  EKG is  ordered today.  The ekg ordered today demonstrates atrial paced rhythm 63 bpm with left bundle branch block and no acute ST/T wave changes.  Recent Labs: 05/24/2019: TSH 4.523 01/24/2020: ALT 24; B Natriuretic Peptide 1,089.7 01/27/2020: BUN 47; Creatinine, Ser 1.19; Hemoglobin 11.7; Magnesium 2.2; Platelets 251; Potassium 4.3; Sodium 139  Recent Lipid Panel    Component Value Date/Time   CHOL 192 05/15/2019 0651   TRIG 281 (H) 05/15/2019 0651   HDL 46 05/15/2019 0651   CHOLHDL 4.2 05/15/2019 0651   VLDL 56 (H) 05/15/2019 0651   LDLCALC 90 05/15/2019 0651    Home Medications   Current Meds  Medication Sig  . acetaminophen (TYLENOL) 500 MG tablet Take 500 mg by mouth every 6 (six) hours as needed for mild pain.  [name] albuterol (VENTOLIN HFA) 108 (90 Base)  MCG/ACT inhaler Inhale 2 puffs into the lungs every 6 (six) hours as needed for wheezing.  [name]. apixaban (ELIQUIS) 5 MG TABS tablet Take 1 tablet (5 mg total) by mouth 2 (two) times daily.  . carvedilol (COREG) 6.25 MG tablet Take 1 tablet (6.25 mg total) by mouth 2 (two) times daily.  . divalproex (DEPAKOTE ER) 500 MG 24 hr tablet Take 1 tablet (500 mg total) by mouth 2 (two) times daily.  [name]. escitalopram (LEXAPRO) 10 MG tablet Take 1 tablet (10 mg total) by mouth at bedtime.  . Fluticasone-Salmeterol (ADVAIR DISKUS) 250-50 MCG/DOSE AEPB Inhale 1 puff into the lungs 2 (two) times daily.  . furosemide (LASIX) 20 MG tablet Take 1 tablet (20 mg total) by mouth daily.  [name]. levothyroxine (SYNTHROID) 25 MCG tablet Take 1 tablet (25 mcg total) by mouth daily.  [name]. lisinopril (ZESTRIL) 2.5 MG tablet Take 1  tablet (2.5 mg total) by mouth daily.  . nicotine (NICODERM CQ - DOSED IN MG/24 HOURS) 21 mg/24hr patch Place 1 patch (21 mg total) onto the skin daily.  [name]. omeprazole (PRILOSEC) 20 MG capsule Take 20 mg by mouth daily.  . polyethylene glycol powder (GLYCOLAX/MIRALAX) 17 GM/SCOOP powder Take 17 g (1 packet mixed in 4-8 oz of liquid)  by mouth daily as needed for moderate constipation.  . QUEtiapine (SEROQUEL) 100 MG tablet Take 100 mg by mouth at bedtime.  [name]. QUEtiapine (SEROQUEL) 50 MG tablet Take 50 mg by mouth in the morning.  . simvastatin (ZOCOR) 40 MG tablet Take 40 mg by mouth at bedtime.  [name]. tiotropium (SPIRIVA HANDIHALER) 18 MCG inhalation capsule Place 1 capsule (18 mcg total) into inhaler and inhale daily.    Review of Systems   Review of Systems  Constitutional: Negative for chills, fever and malaise/fatigue.  Cardiovascular: Positive for dyspnea on exertion. Negative for chest pain, irregular heartbeat, leg swelling, near-syncope, orthopnea, palpitations and syncope.  Respiratory: Negative for cough, shortness of breath and wheezing.   Musculoskeletal: Positive for back pain and neck pain.  Gastrointestinal: Positive for bloating. Negative for melena, nausea and vomiting.  Genitourinary: Negative for hematuria.  Neurological: Negative for dizziness, light-headedness and weakness.   All other systems reviewed and are otherwise negative except as noted above.  Physical Exam    VS:  BP 120/68 (BP Location: Left Arm, Patient Position: Sitting, Cuff Size: Normal)   Pulse 63   Ht 5\' 9"  (1.753 m)   Wt 210 lb 12.8 oz (95.6 kg)   SpO2 98%   BMI 31.13 kg/m  , BMI Body mass index is 31.13 kg/m. GEN: Well nourished, overweight, well developed, in no acute distress. HEENT: normal. Neck: Supple, no JVD, carotid bruits, or masses. Cardiac: RRR, no murmurs, rubs, or gallops. No clubbing, cyanosis.  Trace pedal edema.  Radials/PT 2+ and equal bilaterally.  Respiratory:  Respirations  regular and unlabored, clear to auscultation bilaterally. GI: Abdomen is firm to touch but nontender. MS: No deformity or atrophy. Skin: Warm and dry, no rash. Neuro:  Strength and sensation are intact. Psych: Normal affect.  Assessment & Plan    1. Chronic systolic and diastolic heart failure/ischemic cardiomyopathy - Mild volume overload on exam with abdominal bloating and trace pedal edema.  He has had multiple hospitalizations over the last few months for COPD exacerbation and heart failure exacerbation.  Stop lisinopril and after 3 days start Entresto 24-26 mg twice daily.  We discussed the importance of stopping lisinopril for 3 days prior to initiating Entresto.  BMP today for monitoring.  GDMT will now include Coreg, Lasix, Entresto.  Future consideration include addition of spironolactone.  Recommend less than 2 g sodium diet.  Recommend less than 2 L of fluid per day.  Recommend reduction of dark sodas as a contain high amounts of sodium.  As he has had relative low blood pressure in the past will need careful monitoring of BP in setting of Entresto.  Please check blood pressure once per day at least 2 hours after medications.  If BP is routinely less than 100/60 or if there is new onset lightheadedness, dizziness, near-syncope, syncope recommend call our office.  Recommend to daily weights.  If weight gain of 2 pounds overnight or 5 pounds in 1 week please call our office.  Plan for upgrade to CRT, as below  2. CAD -Lexiscan Myoview 12/14/2019 with prior infarct noted no acute ischemia.  Reports no chest pain, pressure, tightness.  GDMT includes Coreg, simvastatin.  No aspirin secondary to chronic anticoagulation.  Regular cardiovascular exercise encouraged.  3. S/p ICD/LBBB -EKG today shows atrial paced rhythm with known LBBB.  Denies lightheadedness, dizziness, near-syncope, syncope.  Continue to follow with EP Dr. Graciela Husbands.  Plan for device upgrade to CRT device in setting of  cardiomyopathy and LBBB. Heather, RN who works with Dr. Graciela Husbands has been in touch with patient and his caretaker today and dates for preoperative testing were communicated as well as placed in AVS.  4. Back/neck pain - Reports back and neck pain predominantly when he lies down at night.  Relieved by 500 mg Tylenol.  Unfortunately per his caretaker Medicaid only pays for 3 PT sessions.  Encouraged stretching regimen.  5. COPD -discussed importance of tobacco cessation in the setting of COPD.  Continue Spiriva daily and PRN albuterol inhaler.  6. HLD, LDL <70 - Continue Simvastatin.   7. PAF - Noted significant subclinical atrial fibrillation with episodes up to 6 hours.  Due to history of CVA he was transitioned from antiplatelet to Eliquis 5 mg twice daily by Dr. Graciela Husbands.  Denies bleeding complications on Eliquis.  CBC today for monitoring.  8. Tobacco use - Continues to work on reducing his cigarette intake by using nicotine patch.  Complete cessation encouraged.  Congratulated on reducing how much he smokes.   9. Bilateral leg pain - Lower extremity Doppler 12/2019 without evidence of PAD.  Reports no recurrent leg pain.  Walking regimen encouraged.  10. Hypothyroidism -continue to follow with PCP.  Continue present levothyroxine dose.  Disposition: Follow up in 1 month(s) with Dr. Kirke Corin or APP for follow up of heart failure after addition of Entresto.   Alver Sorrow, NP 02/28/2020, 12:17 PM

## 2020-02-29 LAB — CBC WITH DIFFERENTIAL/PLATELET
Basophils Absolute: 0.1 10*3/uL (ref 0.0–0.2)
Basos: 1 %
EOS (ABSOLUTE): 0.2 10*3/uL (ref 0.0–0.4)
Eos: 3 %
Hematocrit: 37.6 % (ref 37.5–51.0)
Hemoglobin: 12.4 g/dL — ABNORMAL LOW (ref 13.0–17.7)
Immature Grans (Abs): 0.1 10*3/uL (ref 0.0–0.1)
Immature Granulocytes: 2 %
Lymphocytes Absolute: 1.7 10*3/uL (ref 0.7–3.1)
Lymphs: 22 %
MCH: 30.2 pg (ref 26.6–33.0)
MCHC: 33 g/dL (ref 31.5–35.7)
MCV: 92 fL (ref 79–97)
Monocytes Absolute: 1.1 10*3/uL — ABNORMAL HIGH (ref 0.1–0.9)
Monocytes: 15 %
Neutrophils Absolute: 4.5 10*3/uL (ref 1.4–7.0)
Neutrophils: 57 %
Platelets: 194 10*3/uL (ref 150–450)
RBC: 4.1 x10E6/uL — ABNORMAL LOW (ref 4.14–5.80)
RDW: 15.6 % — ABNORMAL HIGH (ref 11.6–15.4)
WBC: 7.8 10*3/uL (ref 3.4–10.8)

## 2020-02-29 LAB — BASIC METABOLIC PANEL
BUN/Creatinine Ratio: 19 (ref 10–24)
BUN: 19 mg/dL (ref 8–27)
CO2: 24 mmol/L (ref 20–29)
Calcium: 9.7 mg/dL (ref 8.6–10.2)
Chloride: 100 mmol/L (ref 96–106)
Creatinine, Ser: 1.02 mg/dL (ref 0.76–1.27)
GFR calc Af Amer: 91 mL/min/{1.73_m2} (ref >59)
GFR calc non Af Amer: 79 mL/min/{1.73_m2} (ref >59)
Glucose: 83 mg/dL (ref 65–99)
Potassium: 4.5 mmol/L (ref 3.5–5.2)
Sodium: 136 mmol/L (ref 134–144)

## 2020-03-01 ENCOUNTER — Encounter: Payer: Self-pay | Admitting: Family

## 2020-03-01 ENCOUNTER — Other Ambulatory Visit: Payer: Self-pay

## 2020-03-01 ENCOUNTER — Ambulatory Visit: Payer: Medicaid Other | Attending: Family | Admitting: Family

## 2020-03-01 VITALS — BP 109/52 | HR 66 | Resp 16 | Ht 68.0 in | Wt 208.0 lb

## 2020-03-01 DIAGNOSIS — I5022 Chronic systolic (congestive) heart failure: Secondary | ICD-10-CM | POA: Insufficient documentation

## 2020-03-01 DIAGNOSIS — F319 Bipolar disorder, unspecified: Secondary | ICD-10-CM | POA: Insufficient documentation

## 2020-03-01 DIAGNOSIS — Z881 Allergy status to other antibiotic agents status: Secondary | ICD-10-CM | POA: Diagnosis not present

## 2020-03-01 DIAGNOSIS — I10 Essential (primary) hypertension: Secondary | ICD-10-CM

## 2020-03-01 DIAGNOSIS — Z7951 Long term (current) use of inhaled steroids: Secondary | ICD-10-CM | POA: Diagnosis not present

## 2020-03-01 DIAGNOSIS — Z882 Allergy status to sulfonamides status: Secondary | ICD-10-CM | POA: Diagnosis not present

## 2020-03-01 DIAGNOSIS — I130 Hypertensive heart and chronic kidney disease with heart failure and stage 1 through stage 4 chronic kidney disease, or unspecified chronic kidney disease: Secondary | ICD-10-CM | POA: Insufficient documentation

## 2020-03-01 DIAGNOSIS — Z8673 Personal history of transient ischemic attack (TIA), and cerebral infarction without residual deficits: Secondary | ICD-10-CM | POA: Diagnosis not present

## 2020-03-01 DIAGNOSIS — I252 Old myocardial infarction: Secondary | ICD-10-CM | POA: Insufficient documentation

## 2020-03-01 DIAGNOSIS — J449 Chronic obstructive pulmonary disease, unspecified: Secondary | ICD-10-CM | POA: Insufficient documentation

## 2020-03-01 DIAGNOSIS — Z7901 Long term (current) use of anticoagulants: Secondary | ICD-10-CM | POA: Diagnosis not present

## 2020-03-01 DIAGNOSIS — N189 Chronic kidney disease, unspecified: Secondary | ICD-10-CM | POA: Insufficient documentation

## 2020-03-01 DIAGNOSIS — Z720 Tobacco use: Secondary | ICD-10-CM

## 2020-03-01 DIAGNOSIS — Z951 Presence of aortocoronary bypass graft: Secondary | ICD-10-CM | POA: Diagnosis not present

## 2020-03-01 DIAGNOSIS — F419 Anxiety disorder, unspecified: Secondary | ICD-10-CM | POA: Diagnosis not present

## 2020-03-01 DIAGNOSIS — I5042 Chronic combined systolic (congestive) and diastolic (congestive) heart failure: Secondary | ICD-10-CM

## 2020-03-01 DIAGNOSIS — Z79899 Other long term (current) drug therapy: Secondary | ICD-10-CM | POA: Insufficient documentation

## 2020-03-01 DIAGNOSIS — Z880 Allergy status to penicillin: Secondary | ICD-10-CM | POA: Diagnosis not present

## 2020-03-01 DIAGNOSIS — F17210 Nicotine dependence, cigarettes, uncomplicated: Secondary | ICD-10-CM | POA: Insufficient documentation

## 2020-03-01 NOTE — Progress Notes (Signed)
Patient ID: Carl [name], male    DOB: 1961-04-12, 59 y.o.   MRN: [id number]  HPI  Carl [name] is a 59 y/o male with a history of HTN, CKD, stroke, COPD, NSTEMI, bipolar, depression, seizures, current tobacco use and chronic heart failure.   Echo report from 01/24/20 reviewed and showed an EF of 25-30% along with mildly elevated PA pressure, LAE and mild/moderate Carl. Echo report from 11/18/19 reviewed and showed an EF of 25-30%.  Admitted 01/24/20 due to HF/COPD exacerbation. Initially given IV lasix with transition to oral diuretics. Needed oxygen but was able to be weaned to room air. Given antibiotics for CAP along with prednisone for COPD.     Discharged after 3 days. Admitted 11/18/19 due to acute on chronic HF. Cardiology consult obtained. Needed bipap and then weaned off to room air. Initially needed IV lasix and then transitioned to oral diuretics with resultant loss of 10.7 L. REDS vest reading was 33. Elevated troponin thought to be due to demand ischemia. Discharged after 4 days.   He presents today for a follow-up visit with a chief complaint of minimal shortness of breath upon moderate exertion. He describes this as chronic in nature having been present for several years. He has associated fatigue, increased appetite, intermittent chest pain, light-headedness, anxiety and gradual weight gain along with this. He denies any difficulty sleeping, abdominal distention, palpitations, pedal edema or cough.   Caregiver that is with him says that he's eating "constantly" and also tends to eat non healthy foods like potato chips, sweet tea and lots of pasta.   Lisinopril has been stopped and will be starting entresto BID in 2 days.   Past Medical History:  Diagnosis Date  . Acute pulmonary edema (HCC) 2017  . Acute respiratory failure with hypoxia (HCC) 2017  . Bipolar 1 disorder (HCC)   . CHF (congestive heart failure) (HCC)   . Chronic kidney disease   . COPD (chronic obstructive pulmonary  disease) (HCC)   . Depression   . Hypertension   . Myocardial infarction (HCC)   . NSTEMI (non-ST elevated myocardial infarction) (HCC)   . 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 Seizures (HCC)    childhood  . Stroke (HCC)   . Tobacco abuse    Past Surgical History:  Procedure Laterality Date  . CORONARY ARTERY BYPASS GRAFT     Family History  Problem Relation Age of Onset  . Cancer Mother        "femal cancer"  . Prostate cancer Father    Social History   Tobacco Use  . Smoking status: Current Every Day Smoker    Packs/day: 0.25    Years: 0.00    Pack years: 0.00    Types: Cigarettes  . Smokeless tobacco: Never Used  . Tobacco comment: Using patch to cut back  Substance Use Topics  . Alcohol use: Not Currently   Allergies  Allergen Reactions  . Iodinated Diagnostic Agents Shortness Of Breath  . Penicillins Anaphylaxis and Shortness Of Breath    Respiratory  Tolerated cefuroxime on 01/06/16  . Strawberry Extract Anaphylaxis  . Cefepime Itching    Empiric antibiotic, developed pruritis.   . Erythromycin Itching  . Sulfa Antibiotics Itching, Nausea And Vomiting and Nausea Only   Prior to Admission medications   Medication Sig Start Date End Date Taking? Authorizing Provider  acetaminophen (TYLENOL) 500 MG tablet Take 500 mg by mouth every 6 (six) hours as needed for mild pain.  Yes [provider]  albuterol (VENTOLIN HFA) 108 (90 Base) MCG/ACT inhaler Inhale 2 puffs into the lungs every 6 (six) hours as needed for wheezing. 05/24/19 03/01/20 Yes Clapacs, Jackquline Denmark, MD  apixaban (ELIQUIS) 5 MG TABS tablet Take 1 tablet (5 mg total) by mouth 2 (two) times daily. 01/13/20  Yes Duke Salvia, MD  carvedilol (COREG) 6.25 MG tablet Take 1 tablet (6.25 mg total) by mouth 2 (two) times daily. 05/24/19 03/01/20 Yes Clapacs, Jackquline Denmark, MD  divalproex (DEPAKOTE ER) 500 MG 24 hr tablet Take 1 tablet (500 mg total) by mouth 2 (two) times daily.  05/24/19  Yes Clapacs, Jackquline Denmark, MD  escitalopram (LEXAPRO) 10 MG tablet Take 1 tablet (10 mg total) by mouth at bedtime. 05/24/19  Yes Clapacs, Jackquline Denmark, MD  Fluticasone-Salmeterol (ADVAIR DISKUS) 250-50 MCG/DOSE AEPB Inhale 1 puff into the lungs 2 (two) times daily. 11/22/19 03/01/20 Yes Delfino Lovett, MD  furosemide (LASIX) 20 MG tablet Take 1 tablet (20 mg total) by mouth daily. 01/27/20  Yes Lurene Shadow, MD  levothyroxine (SYNTHROID) 25 MCG tablet Take 1 tablet (25 mcg total) by mouth daily. 05/24/19 03/01/20 Yes Clapacs, Jackquline Denmark, MD  nicotine (NICODERM CQ - DOSED IN MG/24 HOURS) 21 mg/24hr patch Place 1 patch (21 mg total) onto the skin daily. 11/23/19  Yes Delfino Lovett, MD  omeprazole (PRILOSEC) 20 MG capsule Take 20 mg by mouth daily.   Yes [provider]  polyethylene glycol powder (GLYCOLAX/MIRALAX) 17 GM/SCOOP powder Take 17 g (1 packet mixed in 4-8 oz of liquid)  by mouth daily as needed for moderate constipation. 03/23/19  Yes [provider]  QUEtiapine (SEROQUEL) 100 MG tablet Take 100 mg by mouth at bedtime.   Yes [provider]  QUEtiapine (SEROQUEL) 50 MG tablet Take 50 mg by mouth in the morning.   Yes [provider]  simvastatin (ZOCOR) 40 MG tablet Take 40 mg by mouth at bedtime.   Yes [provider]  sacubitril-valsartan (ENTRESTO) 24-26 MG Take 1 tablet by mouth 2 (two) times daily. 02/28/20   Alver Sorrow, NP     Review of Systems  Constitutional: Positive for appetite change (increased) and fatigue.  HENT: Negative for congestion, postnasal drip and sore throat.   Eyes: Negative.   Respiratory: Positive for shortness of breath. Negative for cough.   Cardiovascular: Positive for chest pain (sometimes; worse when stressed or upset). Negative for palpitations and leg swelling.  Gastrointestinal: Negative for abdominal distention and abdominal pain.  Endocrine: Negative.   Genitourinary: Negative.   Musculoskeletal: Positive for  arthralgias (leg pain). Negative for gait problem.  Skin: Negative.   Allergic/Immunologic: Negative.   Neurological: Positive for light-headedness. Negative for dizziness.  Hematological: Negative for adenopathy. Does not bruise/bleed easily.  Psychiatric/Behavioral: Negative for dysphoric mood and sleep disturbance (sleeping on 2 pillows). The patient is nervous/anxious.    Vitals:   03/01/20 0900  BP: (!) 109/52  Pulse: 66  Resp: 16  SpO2: 99%  Weight: 208 lb (94.3 kg)  Height: 5\' 8"  (1.727 m)   Wt Readings from Last 3 Encounters:  03/01/20 208 lb (94.3 kg)  02/28/20 210 lb 12.8 oz (95.6 kg)  01/27/20 202 lb 6.4 oz (91.8 kg)   Lab Results  Component Value Date   CREATININE 1.02 02/28/2020   CREATININE 1.19 01/27/2020   CREATININE 1.34 (H) 01/26/2020    Physical Exam Vitals and nursing note reviewed. Exam conducted with a chaperone present.  Constitutional:  Appearance: Normal appearance.  HENT:     Head: Normocephalic and atraumatic.  Cardiovascular:     Rate and Rhythm: Normal rate and regular rhythm.  Pulmonary:     Effort: Pulmonary effort is normal. No respiratory distress.     Breath sounds: No wheezing or rales.  Abdominal:     Palpations: Abdomen is soft.     Tenderness: There is no abdominal tenderness.  Musculoskeletal:        General: No tenderness.     Cervical back: Normal range of motion and neck supple.     Right lower leg: Edema (trace pitting) present.     Left lower leg: Edema (trace pitting) present.  Skin:    General: Skin is warm and dry.  Neurological:     General: No focal deficit present.     Mental Status: He is alert and oriented to person, place, and time.  Psychiatric:        Mood and Affect: Mood is anxious.        Behavior: Behavior normal.    Assessment & Plan:  1: Chronic heart failure with reduced ejection fraction- - NYHA class II - euvolemic today - weighing daily and staff member says that weight has gradually  risen; reminded to call for an overnight weight gain of >2 pounds or a weekly weight gain of >5 pounds - weight up 14 pounds from last visit here 3 months ago - had lengthy discussion about diet and decreasing portion sizes/ carb intake & increasing activity - saw cardiology Dan Humphreys) 02/28/20 and lisinopril has been stopped to transition to entresto 24/26mg  BID - saw EP Graciela Husbands) 01/13/20 - BNP 01/24/20 was 1089.7  2: HTN- - BP looks good today - [facility] does his primary care  - BMP 02/28/20 reviewed and showed sodium 136, potassium 4.5, creatinine 1.02 and GFR 79  3: Tobacco use- - currently wearing nicotine patch 21mg  every other day - on the days he doesn't wear the patch, he does smoke  - complete cessation discussed for 3 minutes with him  4: Bipolar- - currently living at [facility] - patient cooperative   Facility medication list reviewed.   Due to HF stability, will not make a return appointment for patient at this time. Advised patient and caregiver that they could call back at anytime to schedule another appointment.

## 2020-03-01 NOTE — Patient Instructions (Addendum)
Continue weighing daily and call for an overnight weight gain of > 2 pounds or a weekly weight gain of >5 pounds.   Call us in the future if you'd like to schedule another appointment 

## 2020-03-10 ENCOUNTER — Encounter: Payer: Self-pay | Admitting: *Deleted

## 2020-03-10 NOTE — Telephone Encounter (Signed)
I spoke with the patient and his guardian, Carl [name], at his last office visit on 02/28/20 with Gillian Shields, NP.  The patient will be coming for an appointment on 05/02/20 with Dr. Graciela Husbands for an updated H&P. He will have his Bi-V ICD upgrade on 05/05/20.   I have completed a copy of the patient's pre-procedure instructions and faxed them to [facility] at [phone] per her request.  I have also faxed a copy of these instructions, Dr. Odessa Fleming last office note, & Gillian Shields, NP's last office note to Sela Hua, Social Worker at [phone].  Fax confirmations received for both locations.

## 2020-03-24 ENCOUNTER — Encounter: Payer: Self-pay | Admitting: Emergency Medicine

## 2020-03-24 ENCOUNTER — Emergency Department
Admission: EM | Admit: 2020-03-24 | Discharge: 2020-03-25 | Disposition: A | Discharge status: Home / Self Care | Payer: Medicaid Other | Attending: Emergency Medicine | Admitting: Emergency Medicine

## 2020-03-24 ENCOUNTER — Other Ambulatory Visit: Payer: Self-pay

## 2020-03-24 ENCOUNTER — Emergency Department: Payer: Medicaid Other

## 2020-03-24 DIAGNOSIS — Z79899 Other long term (current) drug therapy: Secondary | ICD-10-CM | POA: Diagnosis not present

## 2020-03-24 DIAGNOSIS — I5023 Acute on chronic systolic (congestive) heart failure: Secondary | ICD-10-CM | POA: Diagnosis not present

## 2020-03-24 DIAGNOSIS — J449 Chronic obstructive pulmonary disease, unspecified: Secondary | ICD-10-CM | POA: Diagnosis not present

## 2020-03-24 DIAGNOSIS — E039 Hypothyroidism, unspecified: Secondary | ICD-10-CM | POA: Diagnosis not present

## 2020-03-24 DIAGNOSIS — I2510 Atherosclerotic heart disease of native coronary artery without angina pectoris: Secondary | ICD-10-CM | POA: Diagnosis not present

## 2020-03-24 DIAGNOSIS — Z7951 Long term (current) use of inhaled steroids: Secondary | ICD-10-CM | POA: Diagnosis not present

## 2020-03-24 DIAGNOSIS — F17210 Nicotine dependence, cigarettes, uncomplicated: Secondary | ICD-10-CM | POA: Insufficient documentation

## 2020-03-24 DIAGNOSIS — N1831 Chronic kidney disease, stage 3a: Secondary | ICD-10-CM | POA: Diagnosis not present

## 2020-03-24 DIAGNOSIS — I130 Hypertensive heart and chronic kidney disease with heart failure and stage 1 through stage 4 chronic kidney disease, or unspecified chronic kidney disease: Secondary | ICD-10-CM | POA: Diagnosis not present

## 2020-03-24 DIAGNOSIS — R569 Unspecified convulsions: Secondary | ICD-10-CM

## 2020-03-24 LAB — URINALYSIS, COMPLETE (UACMP) WITH MICROSCOPIC
Bacteria, UA: NONE SEEN
Bilirubin Urine: NEGATIVE
Glucose, UA: NEGATIVE mg/dL
Hgb urine dipstick: NEGATIVE
Ketones, ur: NEGATIVE mg/dL
Leukocytes,Ua: NEGATIVE
Nitrite: NEGATIVE
Protein, ur: NEGATIVE mg/dL
Specific Gravity, Urine: 1.017 (ref 1.005–1.030)
Squamous Epithelial / HPF: NONE SEEN (ref 0–5)
pH: 6 (ref 5.0–8.0)

## 2020-03-24 LAB — CBC
HCT: 40.8 % (ref 39.0–52.0)
Hemoglobin: 13.4 g/dL (ref 13.0–17.0)
MCH: 30 pg (ref 26.0–34.0)
MCHC: 32.8 g/dL (ref 30.0–36.0)
MCV: 91.5 fL (ref 80.0–100.0)
Platelets: 219 10*3/uL (ref 150–400)
RBC: 4.46 MIL/uL (ref 4.22–5.81)
RDW: 16.3 % — ABNORMAL HIGH (ref 11.5–15.5)
WBC: 6.3 10*3/uL (ref 4.0–10.5)
nRBC: 0 % (ref 0.0–0.2)

## 2020-03-24 LAB — BASIC METABOLIC PANEL
Anion gap: 9 (ref 5–15)
BUN: 24 mg/dL — ABNORMAL HIGH (ref 8–23)
CO2: 26 mmol/L (ref 22–32)
Calcium: 9.3 mg/dL (ref 8.9–10.3)
Chloride: 101 mmol/L (ref 98–111)
Creatinine, Ser: 1.55 mg/dL — ABNORMAL HIGH (ref 0.61–1.24)
GFR calc Af Amer: 55 mL/min — ABNORMAL LOW (ref >60)
GFR calc non Af Amer: 48 mL/min — ABNORMAL LOW (ref >60)
Glucose, Bld: 92 mg/dL (ref 70–99)
Potassium: 4.3 mmol/L (ref 3.5–5.1)
Sodium: 136 mmol/L (ref 135–145)

## 2020-03-24 LAB — GLUCOSE, CAPILLARY: Glucose-Capillary: 96 mg/dL (ref 70–99)

## 2020-03-24 MED ORDER — LEVETIRACETAM 250 MG PO TABS
250.0000 mg | ORAL_TABLET | Freq: Two times a day (BID) | ORAL | 0 refills | Status: DC
Start: 2020-03-24 — End: 2022-11-18

## 2020-03-24 NOTE — ED Notes (Addendum)
Pt's daughter contacted to ask permission for pt to be discharged back to [facility] via cab. Pt verbalizes consent for cab. Cheyenne Adas called for cab, driver states they will be 20 minutes. First nurse given cab voucher.   [facility] contacted at this time to expect patient's arrival.

## 2020-03-24 NOTE — ED Notes (Signed)
Pt given meal tray and sprite. 

## 2020-03-24 NOTE — ED Provider Notes (Signed)
[facility] Emergency Department Provider Note  Time seen: 10:16 PM  I have reviewed the triage vital signs and the nursing notes.   HISTORY  Chief Complaint Dizziness and Seizures   HPI Carl [name] is a 59 y.o. male with a past medical history of bipolar, CHF, CKD, COPD, hypertension, presents to the emergency department after a seizure-like episode.  According to the patient as a child he was an epileptic, was on medications until adulthood.  Patient states he has not had a seizure in many many years, but over the past week or so he has been having intermittent jerking motions mostly of his left side which she states has been previously affected by a stroke.  Patient uses a cane when ambulating.  He states last night he had a bad episode where his left side was shaking and his head jerked back and states he almost fell on the ground but he did not.  Patient also states last night he had a moderate headache which is atypical as well.  Currently patient appears well, denies any headache at this time denies any episodes today.  Patient states he has not seen a neurologist in many many years.  Patient states he is hungry and is asking for something to eat.   Past Medical History:  Diagnosis Date  . Acute pulmonary edema (HCC) 2017  . Acute respiratory failure with hypoxia (HCC) 2017  . Bipolar 1 disorder (HCC)   . CHF (congestive heart failure) (HCC)   . Chronic kidney disease   . COPD (chronic obstructive pulmonary disease) (HCC)   . Depression   . Hypertension   . Myocardial infarction (HCC)   . NSTEMI (non-ST elevated myocardial infarction) (HCC)   . 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 Seizures (HCC)    childhood  . Stroke (HCC)   . Tobacco abuse     Patient Active Problem List   Diagnosis Date Noted  . HCAP (healthcare-associated pneumonia) 01/24/2020  . Hyperkalemia 01/24/2020  . Acute on chronic systolic (congestive)  heart failure (HCC) 01/24/2020  . Seizure (HCC) 01/24/2020  . HLD (hyperlipidemia) 01/24/2020  . Ascites due to alcoholic cirrhosis (HCC)   . Acute on chronic systolic CHF (congestive heart failure) (HCC) 11/18/2019  . 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 Elevated troponin 11/18/2019  . Hypoxia 11/18/2019  . Acute renal failure superimposed on stage 3a chronic kidney disease (HCC) 11/18/2019  . Coronary artery disease 05/15/2019  . Essential hypertension 05/15/2019  . History of stroke 05/15/2019  . Homicidal ideation 05/15/2019  . Hypothyroidism 05/15/2019  . COPD exacerbation (HCC) 03/22/2019  . Bipolar 1 disorder (HCC) 12/23/2018  . Acute on chronic respiratory failure with hypoxia (HCC) 11/03/2018    Past Surgical History:  Procedure Laterality Date  . CORONARY ARTERY BYPASS GRAFT      Prior to Admission medications   Medication Sig Start Date End Date Taking? Authorizing Provider  acetaminophen (TYLENOL) 500 MG tablet Take 500 mg by mouth every 6 (six) hours as needed for mild pain.    [provider]  albuterol (VENTOLIN HFA) 108 (90 Base) MCG/ACT inhaler Inhale 2 puffs into the lungs every 6 (six) hours as needed for wheezing. 05/24/19 03/01/20  Clapacs, Jackquline Denmark, MD  apixaban (ELIQUIS) 5 MG TABS tablet Take 1 tablet (5 mg total) by mouth 2 (two) times daily. 01/13/20   Duke Salvia, MD  carvedilol (COREG) 6.25 MG tablet Take 1 tablet (6.25 mg total)  by mouth 2 (two) times daily. 05/24/19 03/01/20  Clapacs, Jackquline Denmark, MD  divalproex (DEPAKOTE ER) 500 MG 24 hr tablet Take 1 tablet (500 mg total) by mouth 2 (two) times daily. 05/24/19   Clapacs, Jackquline Denmark, MD  escitalopram (LEXAPRO) 10 MG tablet Take 1 tablet (10 mg total) by mouth at bedtime. 05/24/19   Clapacs, Jackquline Denmark, MD  Fluticasone-Salmeterol (ADVAIR DISKUS) 250-50 MCG/DOSE AEPB Inhale 1 puff into the lungs 2 (two) times daily. 11/22/19 03/01/20  Delfino Lovett, MD  furosemide (LASIX) 20 MG tablet  Take 1 tablet (20 mg total) by mouth daily. 01/27/20   Lurene Shadow, MD  levothyroxine (SYNTHROID) 25 MCG tablet Take 1 tablet (25 mcg total) by mouth daily. 05/24/19 03/01/20  Clapacs, Jackquline Denmark, MD  nicotine (NICODERM CQ - DOSED IN MG/24 HOURS) 21 mg/24hr patch Place 1 patch (21 mg total) onto the skin daily. 11/23/19   Delfino Lovett, MD  omeprazole (PRILOSEC) 20 MG capsule Take 20 mg by mouth daily.    [provider]  polyethylene glycol powder (GLYCOLAX/MIRALAX) 17 GM/SCOOP powder Take 17 g (1 packet mixed in 4-8 oz of liquid)  by mouth daily as needed for moderate constipation. 03/23/19   [provider]  QUEtiapine (SEROQUEL) 100 MG tablet Take 100 mg by mouth at bedtime.    [provider]  QUEtiapine (SEROQUEL) 50 MG tablet Take 50 mg by mouth in the morning.    [provider]  sacubitril-valsartan (ENTRESTO) 24-26 MG Take 1 tablet by mouth 2 (two) times daily. 02/28/20   Alver Sorrow, NP  simvastatin (ZOCOR) 40 MG tablet Take 40 mg by mouth at bedtime.    [provider]    Allergies  Allergen Reactions  . Iodinated Diagnostic Agents Shortness Of Breath  . Penicillins Anaphylaxis and Shortness Of Breath    Respiratory  Tolerated cefuroxime on 01/06/16  . Strawberry Extract Anaphylaxis  . Cefepime Itching    Empiric antibiotic, developed pruritis.   . Erythromycin Itching  . Sulfa Antibiotics Itching, Nausea And Vomiting and Nausea Only    Family History  Problem Relation Age of Onset  . Cancer Mother        "femal cancer"  . Prostate cancer Father     Social History Social History   Tobacco Use  . Smoking status: Current Every Day Smoker    Packs/day: 0.25    Years: 0.00    Pack years: 0.00    Types: Cigarettes  . Smokeless tobacco: Never Used  . Tobacco comment: Using patch to cut back  Substance Use Topics  . Alcohol use: Not Currently  . Drug use: Never    Review of Systems Constitutional: Negative for  fever. Cardiovascular: Negative for chest pain. Respiratory: Negative for shortness of breath. Gastrointestinal: Negative for abdominal pain Musculoskeletal: Negative for musculoskeletal complaints Neurological: Headache last night, none today.  Denies any new weakness or numbness. All other ROS negative  ____________________________________________   PHYSICAL EXAM:  VITAL SIGNS: ED Triage Vitals  Enc Vitals Group     BP 03/24/20 1913 121/75     Pulse Rate 03/24/20 1913 64     Resp 03/24/20 1913 20     Temp 03/24/20 1913 (!) 97.5 F (36.4 C)     Temp Source 03/24/20 1913 Oral     SpO2 03/24/20 1913 100 %     Weight 03/24/20 1914 202 lb (91.6 kg)     Height 03/24/20 1914 5\' 9"  (1.753 m)  Head Circumference --      Peak Flow --      Pain Score 03/24/20 1914 0     Pain Loc --      Pain Edu? --      Excl. in GC? --    Constitutional: Alert and oriented. Well appearing and in no distress. Eyes: Normal exam ENT      Head: Normocephalic and atraumatic.      Mouth/Throat: Mucous membranes are moist. Cardiovascular: Normal rate, regular rhythm Respiratory: Normal respiratory effort without tachypnea nor retractions. Breath sounds are clear  Gastrointestinal: Soft and nontender. No distention.  Musculoskeletal: Nontender with normal range of motion in all extremities.  Neurologic:  Normal speech and language. No gross focal neurologic deficits  Skin:  Skin is warm, dry and intact.  Psychiatric: Mood and affect are normal  ____________________________________________    EKG  EKG viewed and interpreted by myself shows atrial paced rhythm at 64 bpm with a widened QRS, normal axis largely normal intervals nonspecific ST changes  ____________________________________________    RADIOLOGY  IMPRESSION:  1. No acute intracranial abnormality identified.  2. 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  3. Chronic appearing left  maxillary sinusitis.   ____________________________________________   INITIAL IMPRESSION / ASSESSMENT AND PLAN / ED COURSE  Pertinent labs & imaging results that were available during my care of the patient were reviewed by me and considered in my medical decision making (see chart for details).   Patient presents emergency department with concerns over episodes in which he states is mostly left side will convulse at times.  Patient denies any loss of consciousness.  Denies any head trauma.  Patient does state a history of seizures in the past including partial and grand mal seizures has not had a seizure in many many years.  Patient's description of the episodes could be consistent with partial seizure.  Given the patient's headache and possible new onset of seizures after years of remission we will obtain CT imaging of the head to rule out mass tumor, etc.  Patient's lab work is largely nonrevealing, besides slight renal insufficiency.  Patient is orally hydrating.  He is also requesting something to eat we will feed the patient.  We will obtain CT scan, if CT scan is negative anticipate likely discharge home.  I do believe however with the patient's description of symptoms patient should be started on antiepileptic such as Keppra with neurology follow-up.  Patient agreeable to plan of care.  Patient CT scan is essentially negative for acute abnormality.  We will discharge patient from the emergency department on a low dose of Keppra with PCP follow-up and neurology follow-up.  Patient agreeable.  Carl [name] was evaluated in Emergency Department on 03/24/2020 for the symptoms described in the history of present illness. He was evaluated in the context of the global COVID-19 pandemic, which necessitated consideration that the patient might be at risk for infection with the SARS-CoV-2 virus that causes COVID-19. Institutional protocols and algorithms that pertain to the evaluation of patients at risk  for COVID-19 are in a state of rapid change based on information released by regulatory bodies including the CDC and federal and state organizations. These policies and algorithms were followed during the patient's care in the ED.  ____________________________________________   FINAL CLINICAL IMPRESSION(S) / ED DIAGNOSES  Seizure-like activity    Minna Antis, MD 03/24/20 2319

## 2020-03-24 NOTE — ED Notes (Signed)
Unable to contact patient's legal guardian for discharge.
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
## 2020-03-24 NOTE — ED Triage Notes (Signed)
Pt presents to ER from [facility] facility reports he had a seizure episode yesterday, reports history of seizures stopped taking his medication back in 2008. Pt talks in complete sentences no respiratory distress noted.

## 2020-03-30 ENCOUNTER — Ambulatory Visit (INDEPENDENT_AMBULATORY_CARE_PROVIDER_SITE_OTHER): Payer: Medicaid Other | Admitting: Family

## 2020-03-30 ENCOUNTER — Other Ambulatory Visit: Payer: Self-pay

## 2020-03-30 ENCOUNTER — Encounter: Payer: Self-pay | Admitting: Family

## 2020-03-30 VITALS — BP 100/56 | HR 76 | Ht 71.0 in | Wt 203.4 lb

## 2020-03-30 DIAGNOSIS — I255 Ischemic cardiomyopathy: Secondary | ICD-10-CM

## 2020-03-30 DIAGNOSIS — I5042 Chronic combined systolic (congestive) and diastolic (congestive) heart failure: Secondary | ICD-10-CM

## 2020-03-30 DIAGNOSIS — I25118 Atherosclerotic heart disease of native coronary artery with other forms of angina pectoris: Secondary | ICD-10-CM | POA: Diagnosis not present

## 2020-03-30 DIAGNOSIS — E785 Hyperlipidemia, unspecified: Secondary | ICD-10-CM | POA: Diagnosis not present

## 2020-03-30 DIAGNOSIS — Z720 Tobacco use: Secondary | ICD-10-CM

## 2020-03-30 DIAGNOSIS — Z7901 Long term (current) use of anticoagulants: Secondary | ICD-10-CM

## 2020-03-30 DIAGNOSIS — I480 Paroxysmal atrial fibrillation: Secondary | ICD-10-CM

## 2020-03-30 MED ORDER — LISINOPRIL 2.5 MG PO TABS: 2.5000 mg | ORAL_TABLET | Freq: Every day | ORAL | 1 refills | Status: DC

## 2020-03-30 NOTE — Progress Notes (Signed)
Office Visit    Patient Name: Carl [name] Date of Encounter: 03/30/2020  Primary Care Provider:  Almetta Lovely, [facility] Primary Cardiologist:  Lorine Bears, MD Electrophysiologist:  [name], MD   Chief Complaint    Carl [name] is a 59 y.o. male with a hx of CAD s/p CABG 2017 with MV repair and PFO closure, ischemic heart disease, CVA, Metronic ICD DUAL chamber, COPD, HTN, LBBB, PAF presents today for follow up of heart failure after addition of Entresto.   Past Medical History    Past Medical History:  Diagnosis Date  . Acute pulmonary edema (HCC) 2017  . Acute respiratory failure with hypoxia (HCC) 2017  . Bipolar 1 disorder (HCC)   . CHF (congestive heart failure) (HCC)   . Chronic kidney disease   . COPD (chronic obstructive pulmonary disease) (HCC)   . Depression   . Hypertension   . Myocardial infarction (HCC)   . NSTEMI (non-ST elevated myocardial infarction) (HCC)   . 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 Seizures (HCC)    childhood  . Stroke (HCC)   . Tobacco abuse    Past Surgical History:  Procedure Laterality Date  . CORONARY ARTERY BYPASS GRAFT      Allergies  Allergies  Allergen Reactions  . Iodinated Diagnostic Agents Shortness Of Breath  . Penicillins Anaphylaxis and Shortness Of Breath    Respiratory  Tolerated cefuroxime on 01/06/16  . Strawberry Extract Anaphylaxis  . Cefepime Itching    Empiric antibiotic, developed pruritis.   . Erythromycin Itching  . Sulfa Antibiotics Itching, Nausea And Vomiting and Nausea Only    History of Present Illness    Carl [name] is a 59 y.o. male with a hx of  CAD s/p CABG 2017 with MV repair and PFO closure, ischemic cardiomyopathy/combined systolic and diastolic heart failure, CVA, Metronic ICD DUAL chamber, COPD, HTN, LBBB, PAF on Eliquis, tobacco use, thyroid disease. He was last seen 02/28/20.[name]  He was hospitalized at [facility] 11/18/2019 for acute respiratory  distress in setting of COPD and heart failure exacerbation.  Treated with steroid, empiric antibiotics, bronchodilator, IV diuresis at 10.7 L.  Echo during admission with EF 25-30%.  He previously followed with [facility] cardiology.  He has since established with Dr. Kirke Corin, [facility] heart failure clinic, and Dr. Graciela Husbands.  When seen by Dr. Kirke Corin 12/02/2019 for hospital follow-up he was recommended for [facility]  He was referred to Dr. Graciela Husbands for consideration of upgrade to CRT device.  Lexiscan Myoview 12/08/2019 showed evidence of prior infarct with no significant ischemia -large defect of severe severity present in mid anterior, mid inferior, mid inferolateral, apical anterior, apical inferior and apex location with no significant reversibility and EF 23%.  He was readmitted 01/24/2020 to 01/27/2020 treated with IV antibiotics for sepsis secondary to community-acquired pneumonia.  Also given IV Lasix for acute CHF exacerbation and prednisone for COPD exacerbation.  Repeat echocardiogram 01/24/2020 with EF 25-30%, grade 1 diastolic dysfunction, mild to moderate MR unchanged compared to previous.  Lives at [facility]. Presents today with his caretaker.   Seen in clinic 02/28/20. Lisinopril switched to [facility] for optimization of GDMT.  Tells me yesterday he spent most of the day in bed. Tells me he "felt like crap".  Difficult to ascertain history.  Tells me he feels better today. His weight is down 7 pounds from clinic visit one month ago. Reports no lightheadedness nor dizziness.  His blood pressure is not  routinely checked at his assisted living facility.  Does report some fatigue and weakness.  Reports intermittent lower extremity edema worse in the evening though does still eat some high sodium foods.  Reports no chest pain, pressure, tightness. .   EKGs/Labs/Other Studies Reviewed:   The following studies were reviewed today:  Echo 01/24/20 1. Left ventricular ejection fraction,  by estimation, is 25 to 30%. The  left ventricle has severely decreased function. The left ventricle  demonstrates global hypokinesis. Left ventricular diastolic parameters are  consistent with Grade I diastolic  dysfunction (impaired relaxation).   2. Right ventricular systolic function is mildly reduced. The right  ventricular size is moderately enlarged. There is mildly elevated  pulmonary artery systolic pressure.   3. Left atrial size was mild to moderately dilated.   4. Right atrial size was moderately dilated.   5. The mitral valve is degenerative. Mild to moderate mitral valve  regurgitation. No evidence of mitral stenosis.   6. The aortic valve is normal in structure. Aortic valve regurgitation is  trivial. No aortic stenosis is present.   7. The inferior vena cava is normal in size with greater than 50%  respiratory variability, suggesting right atrial pressure of 3 mmHg.   VAS [place] LE ABI 01/07/20 Right: Resting right ankle-brachial index is within normal range. No  evidence of significant right lower extremity arterial disease. The right  toe-brachial index is normal.   Left: Resting left ankle-brachial index is within normal range. No  evidence of significant left lower extremity arterial disease. The left  toe-brachial index is abnormal.   Echo 11/18/19  1. Left ventricular ejection fraction, by estimation, is 25 to 30%. The  left ventricle has moderate to severely decreased function. The left  ventricle demonstrates global hypokinesis. The left ventricular internal  cavity size was moderately dilated.  Left ventricular diastolic parameters were normal.   2. Right ventricular systolic function is normal. The right ventricular  size is mildly enlarged.   3. Left atrial size was mildly dilated.   4. Right atrial size was mildly dilated.   5. The mitral valve is normal in structure. No evidence of mitral valve  regurgitation.   6. The aortic valve is grossly normal. Aortic  valve regurgitation is not  visualized.   EKG: No EKG today today.  EKG independently reviewed from 02/28/2020 demonstrates  atrial paced rhythm 63 bpm with left bundle branch block and no acute ST/T wave changes.  Recent Labs: 05/24/2019: TSH 4.523 01/24/2020: ALT 24; B Natriuretic Peptide 1,089.7 01/27/2020: Magnesium 2.2 03/24/2020: BUN 24; Creatinine, Ser 1.55; Hemoglobin 13.4; Platelets 219; Potassium 4.3; Sodium 136  Recent Lipid Panel    Component Value Date/Time   CHOL 192 05/15/2019 0651   TRIG 281 (H) 05/15/2019 0651   HDL 46 05/15/2019 0651   CHOLHDL 4.2 05/15/2019 0651   VLDL 56 (H) 05/15/2019 0651   LDLCALC 90 05/15/2019 0651    Home Medications   Current Meds  Medication Sig  . acetaminophen (TYLENOL) 500 MG tablet Take 500 mg by mouth every 6 (six) hours as needed for mild pain.  [name] albuterol (VENTOLIN HFA) 108 (90 Base) MCG/ACT inhaler Inhale 2 puffs into the lungs every 6 (six) hours as needed for wheezing.  [name] apixaban (ELIQUIS) 5 MG TABS tablet Take 1 tablet (5 mg total) by mouth 2 (two) times daily.  . carvedilol (COREG) 6.25 MG tablet Take 1 tablet (6.25 mg total) by mouth 2 (two) times daily.  . divalproex (DEPAKOTE  ER) 500 MG 24 hr tablet Take 1 tablet (500 mg total) by mouth 2 (two) times daily.  [name] escitalopram (LEXAPRO) 10 MG tablet Take 1 tablet (10 mg total) by mouth at bedtime.  . fenofibrate 160 MG tablet Take 160 mg by mouth daily.  . Fluticasone-Salmeterol (ADVAIR DISKUS) 250-50 MCG/DOSE AEPB Inhale 1 puff into the lungs 2 (two) times daily.  . furosemide (LASIX) 20 MG tablet Take 1 tablet (20 mg total) by mouth daily.  [name] levETIRAcetam (KEPPRA) 250 MG tablet Take 1 tablet (250 mg total) by mouth 2 (two) times daily.  [name] levothyroxine (SYNTHROID) 25 MCG tablet Take 1 tablet (25 mcg total) by mouth daily.  . nicotine (NICODERM CQ - DOSED IN MG/24 HOURS) 21 mg/24hr patch Place 1 patch (21 mg total) onto the skin daily.  [name] omeprazole (PRILOSEC) 20 MG capsule  Take 20 mg by mouth daily.  . polyethylene glycol powder (GLYCOLAX/MIRALAX) 17 GM/SCOOP powder Take 17 g (1 packet mixed in 4-8 oz of liquid)  by mouth daily as needed for moderate constipation.  . QUEtiapine (SEROQUEL) 100 MG tablet Take 100 mg by mouth at bedtime.  [name] QUEtiapine (SEROQUEL) 50 MG tablet Take 50 mg by mouth in the morning.  . simvastatin (ZOCOR) 40 MG tablet Take 40 mg by mouth at bedtime.  . [DISCONTINUED] sacubitril-valsartan (ENTRESTO) 24-26 MG Take 1 tablet by mouth 2 (two) times daily.    Review of Systems  Review of Systems  Constitutional: Positive for malaise/fatigue. Negative for chills and fever.  Cardiovascular: Positive for dyspnea on exertion and leg swelling. Negative for chest pain, irregular heartbeat, near-syncope, orthopnea, palpitations and syncope.  Respiratory: Negative for cough, shortness of breath and wheezing.   Gastrointestinal: Negative for melena, nausea and vomiting.  Genitourinary: Negative for hematuria.  Neurological: Negative for dizziness, light-headedness and weakness.   All other systems reviewed and are otherwise negative except as noted above.  Physical Exam    VS:  BP (!) 100/56   Pulse 76   Ht 5\' 11"  (1.803 m)   Wt 203 lb 6 oz (92.3 kg)   SpO2 98%   BMI 28.37 kg/m  , BMI Body mass index is 28.37 kg/m. GEN: Well nourished, overweight, well developed, in no acute distress. HEENT: normal. Neck: Supple, no JVD, carotid bruits, or masses. Cardiac: RRR, no murmurs, rubs, or gallops. No clubbing, cyanosis.  Trace pedal edema.  Radials/PT 2+ and equal bilaterally.  Respiratory:  Respirations regular and unlabored, clear to auscultation bilaterally. GI: Abdomen is firm to touch but nontender. MS: No deformity or atrophy. Skin: Warm and dry, no rash. Neuro:  Strength and sensation are intact. Psych: Normal affect.  Assessment & Plan    1. Chronic systolic and diastolic heart failure/ischemic cardiomyopathy -unable to tolerate  Entresto due to hypotension.  Stop Entresto, resume lisinopril 2.5 mg daily.  Additional GDMT includes Coreg 6.25 mg twice daily, furosemide 20 mg daily. Future consideration include addition of spironolactone.  Recommend less than 2 g sodium diet.  Recommend less than 2 L of fluid per day.  Recommend reduction of dark sodas as a contain high amounts of sodium.  Recommend to daily weights.  If weight gain of 2 pounds overnight or 5 pounds in 1 week please call our office.  Plan for upgrade to CRT, as below  2. CAD -Lexiscan Myoview 12/14/2019 with prior infarct noted no acute ischemia.  Reports no chest pain, pressure, tightness.  GDMT includes Coreg, simvastatin.  No aspirin secondary to chronic  anticoagulation.  Regular cardiovascular exercise encouraged.  3. S/p ICD/LBBB -.  Denies lightheadedness, dizziness, near-syncope, syncope.  Continue to follow with EP Dr. Graciela HusbandsKlein.  Plan for device upgrade to CRT device in setting of cardiomyopathy and LBBB.  Scheduled in October.  4. COPD -discussed importance of tobacco cessation in the setting of COPD.  Continue Spiriva daily and PRN albuterol inhaler.  5. HLD, LDL <70 - Continue Simvastatin.   6. PAF - Noted significant subclinical atrial fibrillation with episodes up to 6 hours.  Due to history of CVA he was transitioned from antiplatelet to Eliquis 5 mg twice daily by Dr. Graciela HusbandsKlein.  Denies bleeding complications on Eliquis.  Recent CBC stable.  7. Tobacco use - Continues to work on reducing his cigarette intake by using nicotine patch.  Complete cessation encouraged.  Congratulated on reducing how much he smokes.   8. Bilateral leg pain - Lower extremity Doppler 12/2019 without evidence of PAD.  Reports no recurrent leg pain.  Walking regimen encouraged.  9. Hypothyroidism -continue to follow with PCP.  Continue present levothyroxine dose.  Disposition: Follow up in October with Dr. Graciela HusbandsKlein as previously scheduled and in 3 to 4 months with Dr. Kirke CorinArida  or APP  Alver Sorrowaitlin S Charlei Ramsaran, NP 03/30/2020, 4:53 PM

## 2020-03-30 NOTE — Patient Instructions (Addendum)
Medication Instructions:  Your physician has recommended you make the following change in your medication:   STOP Entresto   After 3 days   START Lisinopril 2.5mg  daily  *If you need a refill on your cardiac medications before your next appointment, please call your pharmacy*  Lab Work: None ordered today  Testing/Procedures: None ordered today.   Follow-Up: At [facility], you and your health needs are our priority.  As part of our continuing mission to provide you with exceptional heart care, we have created designated Provider Care Teams.  These Care Teams include your primary Cardiologist (physician) and Advanced Practice Providers (APPs -  Physician Assistants and Nurse Practitioners) who all work together to provide you with the care you need, when you need it.  We recommend signing up for the patient portal called "MyChart".  Sign up information is provided on this After Visit Summary.  MyChart is used to connect with patients for Virtual Visits (Telemedicine).  Patients are able to view lab/test results, encounter notes, upcoming appointments, etc.  Non-urgent messages can be sent to your provider as well.   To learn more about what you can do with MyChart, go to [email or web address].    Your next appointment:   With Dr. Graciela Husbands in October as previously scheduled.   In 3 months with Dr. Kirke Corin or APP  Other Instructions  Continue low salt diet.  Continue to drink less than 2 liters of fluid per day.

## 2020-05-02 ENCOUNTER — Ambulatory Visit (INDEPENDENT_AMBULATORY_CARE_PROVIDER_SITE_OTHER): Payer: Medicaid Other | Admitting: Internal Medicine

## 2020-05-02 ENCOUNTER — Other Ambulatory Visit: Payer: Self-pay

## 2020-05-02 ENCOUNTER — Other Ambulatory Visit
Admission: RE | Admit: 2020-05-02 | Discharge: 2020-05-02 | Disposition: A | Discharge status: Home / Self Care | Payer: Medicaid Other | Source: Ambulatory Visit | Attending: Internal Medicine | Admitting: Internal Medicine

## 2020-05-02 ENCOUNTER — Encounter: Payer: Self-pay | Admitting: Internal Medicine

## 2020-05-02 ENCOUNTER — Telehealth: Payer: Self-pay | Admitting: Internal Medicine

## 2020-05-02 ENCOUNTER — Encounter: Payer: Self-pay | Admitting: *Deleted

## 2020-05-02 VITALS — BP 108/70 | HR 64 | Ht 71.0 in | Wt 202.0 lb

## 2020-05-02 DIAGNOSIS — Z01812 Encounter for preprocedural laboratory examination: Secondary | ICD-10-CM

## 2020-05-02 DIAGNOSIS — I255 Ischemic cardiomyopathy: Secondary | ICD-10-CM | POA: Diagnosis not present

## 2020-05-02 DIAGNOSIS — Z95810 Presence of automatic (implantable) cardiac defibrillator: Secondary | ICD-10-CM | POA: Diagnosis not present

## 2020-05-02 DIAGNOSIS — Z20822 Contact with and (suspected) exposure to covid-19: Secondary | ICD-10-CM | POA: Diagnosis not present

## 2020-05-02 DIAGNOSIS — I5042 Chronic combined systolic (congestive) and diastolic (congestive) heart failure: Secondary | ICD-10-CM

## 2020-05-02 DIAGNOSIS — I447 Left bundle-branch block, unspecified: Secondary | ICD-10-CM

## 2020-05-02 LAB — SARS CORONAVIRUS 2 (TAT 6-24 HRS): SARS Coronavirus 2: NEGATIVE

## 2020-05-02 NOTE — Progress Notes (Signed)
    Patient Care Team: Housecalls, Doctors Making as PCP - General (Geriatric Medicine) Arida, Muhammad A, MD as PCP - Cardiology (Cardiology) Korrie Hofbauer C, MD as PCP - Electrophysiology (Cardiology)   HPI  Carl [name] is a 59 y.o. male seen in follow-up for dual-chamber ICD with intercurrent left bundle branch block and the issue raised at [facility] regarding CRT upgrade.    History of SCAF detected on the device.  Greater than 6 hours.  Started on Eliquis.  Intercurrent hospitalization for community-acquired pneumonia complicated by CHF exacerbation.  Intercurrently started on Entresto.  With clinical improvement but still with moderate shortness of breath  Scheduled for CRT upgrade aware of the plan and hoping for clinical improvement  He has a state as his legal guardian.  DATE TEST EF   6/17 LHC    % LAD p-80, D1-80, CX m-80 RCA m >>DESx2  4/20 Echo   20-25% %   5/21 Echo  25-30%   6/21 Myoview 23% Antero/infero apical Defect fixed   7/21 Echo  25-30% MR mild-mod   Date Cr K Hgb  5/21 1.4<<1.02 4.5 14.2          Thromboembolic risk factors (, HTN-1, TIA/CVA-2, Vasc disease -1, CHF-1) for a CHADSVASc Score of .>=5  Records and Results Reviewed   Past Medical History:  Diagnosis Date  . Acute pulmonary edema (HCC) 2017  . Acute respiratory failure with hypoxia (HCC) 2017  . Bipolar 1 disorder (HCC)   . CHF (congestive heart failure) (HCC)   . Chronic kidney disease   . COPD (chronic obstructive pulmonary disease) (HCC)   . Depression   . Hypertension   . Myocardial infarction (HCC)   . NSTEMI (non-ST elevated myocardial infarction) (HCC)   . 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 Seizures (HCC)    childhood  . Stroke (HCC)   . Tobacco abuse     Past Surgical History:  Procedure Laterality Date  . CORONARY ARTERY BYPASS GRAFT      No outpatient medications have been marked as taking for the 05/02/20 encounter  (Office Visit) with Alexcis Bicking C, MD.    Allergies  Allergen Reactions  . Iodinated Diagnostic Agents Shortness Of Breath  . Penicillins Anaphylaxis and Shortness Of Breath    Respiratory  Tolerated cefuroxime on 01/06/16  . Strawberry Extract Anaphylaxis  . Cefepime Itching    Empiric antibiotic, developed pruritis.   . Erythromycin Itching  . Sulfa Antibiotics Itching, Nausea And Vomiting and Nausea Only      Review of Systems negative except from HPI and PMH  Physical Exam BP 108/70 (BP Location: Left Arm, Patient Position: Sitting, Cuff Size: Normal)   Pulse 64   Ht 5' 11" (1.803 m)   Wt 202 lb (91.6 kg)   SpO2 98%   BMI 28.17 kg/m  Well developed and well nourished in no acute distress HENT normal E scleral and icterus clear Neck Supple JVP flat; carotids brisk and full Clear to ausculation  Regular rate and rhythm, no murmurs gallops or rub Soft with active bowel sounds No clubbing cyanosis  Edema Alert and oriented, grossly normal motor and sensory function Skin Warm and Dry  ECG sinus with left bundle branch block  CrCl cannot be calculated (Patient's most recent lab result is older than the maximum 21 days allowed.).   Assessment and  Plan  Ischemic cardiomyopathy-'s s/p CABG plus mitral valve ring  Congestive heart failure-chronic-systolic-class IIb-IIIa  Implantable   defibrillator-dual-chamber-Medtronic  Left bundle branch block  Atrial fibrillation-subclinical  Strokes-multiple  Bipolar disorder  Renal insufficiency acute/chronic   For CRT upgrade.  Potential risks include but are not limited to pneumothorax, cardiac perforation, bleeding in the pocket, diaphragmatic stimulation, death and infection.  There is increasing RV pacing thresholds, will attempt left bundle branch block area pacing and/or CRT depending on the anatomy.  We will hold the Eliquis for 24 hours prior to the procedure.  We will be in touch with [facility] as his legal guardian.  Without symptoms of ischemia      Current medicines are reviewed at length with the patient today .  The patient does not  have concerns regarding medicines.

## 2020-05-02 NOTE — H&P (View-Only) (Signed)
Patient Care Team: Housecalls, Doctors Making as PCP - General (Geriatric Medicine) Iran Ouch, MD as PCP - Cardiology (Cardiology) Duke Salvia, MD as PCP - Electrophysiology (Cardiology)   HPI  Carl [name] is a 59 y.o. male seen in follow-up for dual-chamber ICD with intercurrent left bundle branch block and the issue raised at [facility] regarding CRT upgrade.    History of SCAF detected on the device.  Greater than 6 hours.  Started on Eliquis.  Intercurrent hospitalization for community-acquired pneumonia complicated by CHF exacerbation.  Intercurrently started on Entresto.  With clinical improvement but still with moderate shortness of breath  Scheduled for CRT upgrade aware of the plan and hoping for clinical improvement  He has a state as his legal guardian.  DATE TEST EF   6/17 LHC    % LAD p-80, D1-80, CX m-80 RCA m >>DESx2  4/20 Echo   20-25% %   5/21 Echo  25-30%   6/21 Myoview 23% Antero/infero apical Defect fixed   7/21 Echo  25-30% MR mild-mod   Date Cr K Hgb  5/21 1.4<<1.02 4.5 14.2          Thromboembolic risk factors (, HTN-1, TIA/CVA-2, Vasc disease -1, CHF-1) for a CHADSVASc Score of .>=5  Records and Results Reviewed   Past Medical History:  Diagnosis Date  . Acute pulmonary edema (HCC) 2017  . Acute respiratory failure with hypoxia (HCC) 2017  . Bipolar 1 disorder (HCC)   . CHF (congestive heart failure) (HCC)   . Chronic kidney disease   . COPD (chronic obstructive pulmonary disease) (HCC)   . Depression   . Hypertension   . Myocardial infarction (HCC)   . NSTEMI (non-ST elevated myocardial infarction) (HCC)   . 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 Seizures (HCC)    childhood  . Stroke (HCC)   . Tobacco abuse     Past Surgical History:  Procedure Laterality Date  . CORONARY ARTERY BYPASS GRAFT      No outpatient medications have been marked as taking for the 05/02/20 encounter  (Office Visit) with Duke Salvia, MD.    Allergies  Allergen Reactions  . Iodinated Diagnostic Agents Shortness Of Breath  . Penicillins Anaphylaxis and Shortness Of Breath    Respiratory  Tolerated cefuroxime on 01/06/16  . Strawberry Extract Anaphylaxis  . Cefepime Itching    Empiric antibiotic, developed pruritis.   . Erythromycin Itching  . Sulfa Antibiotics Itching, Nausea And Vomiting and Nausea Only      Review of Systems negative except from HPI and PMH  Physical Exam BP 108/70 (BP Location: Left Arm, Patient Position: Sitting, Cuff Size: Normal)   Pulse 64   Ht 5\' 11"  (1.803 m)   Wt 202 lb (91.6 kg)   SpO2 98%   BMI 28.17 kg/m  Well developed and well nourished in no acute distress HENT normal E scleral and icterus clear Neck Supple JVP flat; carotids brisk and full Clear to ausculation  Regular rate and rhythm, no murmurs gallops or rub Soft with active bowel sounds No clubbing cyanosis  Edema Alert and oriented, grossly normal motor and sensory function Skin Warm and Dry  ECG sinus with left bundle branch block  CrCl cannot be calculated (Patient's most recent lab result is older than the maximum 21 days allowed.).   Assessment and  Plan  Ischemic cardiomyopathy-'s s/p CABG plus mitral valve ring  Congestive heart failure-chronic-systolic-class IIb-IIIa  Implantable  defibrillator-dual-chamber-Medtronic  Left bundle branch block  Atrial fibrillation-subclinical  Strokes-multiple  Bipolar disorder  Renal insufficiency acute/chronic   For CRT upgrade.  Potential risks include but are not limited to pneumothorax, cardiac perforation, bleeding in the pocket, diaphragmatic stimulation, death and infection.  There is increasing RV pacing thresholds, will attempt left bundle branch block area pacing and/or CRT depending on the anatomy.  We will hold the Eliquis for 24 hours prior to the procedure.  We will be in touch with [facility] as his legal guardian.  Without symptoms of ischemia      Current medicines are reviewed at length with the patient today .  The patient does not  have concerns regarding medicines.

## 2020-05-02 NOTE — Patient Instructions (Signed)
Medication Instructions:  - Your physician recommends that you continue on your current medications as directed. Please refer to the Current Medication list given to you today.  *If you need a refill on your cardiac medications before your next appointment, please call your pharmacy*   Lab Work: 1) Pre procedure lab work: today- BMP/ CBC  2) Pre procedure COVID swab: today - please drive around to [facility] - staff will come out to the car to swab you  If you have labs (blood work) drawn today and your tests are completely normal, you will receive your results only by: [name] MyChart Message (if you have MyChart) OR . A paper copy in the mail If you have any lab test that is abnormal or we need to change your treatment, we will call you to review the results.   Testing/Procedures: - Your physician has recommended that you have a defibrillator upgrade.  - see attached instruction sheet   Follow-Up: At [facility], you and your health needs are our priority.  As part of our continuing mission to provide you with exceptional heart care, we have created designated Provider Care Teams.  These Care Teams include your primary Cardiologist (physician) and Advanced Practice Providers (APPs -  Physician Assistants and Nurse Practitioners) who all work together to provide you with the care you need, when you need it.  We recommend signing up for the patient portal called "MyChart".  Sign up information is provided on this After Visit Summary.  MyChart is used to connect with patients for Virtual Visits (Telemedicine).  Patients are able to view lab/test results, encounter notes, upcoming appointments, etc.  Non-urgent messages can be sent to your provider as well.   To learn more about what you can do with MyChart, go to [email or web address].    Your next appointment:   1) 10-14 days (from 05/05/20) with the Device Clinic for wound check    2) 91 days (from 05/05/20) with Dr.  Graciela Husbands  The format for your next appointment:   In Person  Provider:   As above   Other Instructions

## 2020-05-02 NOTE — Telephone Encounter (Signed)
Reynaldo Minium with DSS returning call re: forms to be signed   Fax: [phone]   Phone : [phone]

## 2020-05-03 LAB — CBC WITH DIFFERENTIAL/PLATELET
Basophils Absolute: 0.1 10*3/uL (ref 0.0–0.2)
Basos: 1 %
EOS (ABSOLUTE): 0.2 10*3/uL (ref 0.0–0.4)
Eos: 2 %
Hematocrit: 44.5 % (ref 37.5–51.0)
Hemoglobin: 14.4 g/dL (ref 13.0–17.7)
Immature Grans (Abs): 0.1 10*3/uL (ref 0.0–0.1)
Immature Granulocytes: 1 %
Lymphocytes Absolute: 1.8 10*3/uL (ref 0.7–3.1)
Lymphs: 23 %
MCH: 28.9 pg (ref 26.6–33.0)
MCHC: 32.4 g/dL (ref 31.5–35.7)
MCV: 89 fL (ref 79–97)
Monocytes Absolute: 0.9 10*3/uL (ref 0.1–0.9)
Monocytes: 11 %
Neutrophils Absolute: 5.1 10*3/uL (ref 1.4–7.0)
Neutrophils: 62 %
Platelets: 259 10*3/uL (ref 150–450)
RBC: 4.99 x10E6/uL (ref 4.14–5.80)
RDW: 14.4 % (ref 11.6–15.4)
WBC: 8.1 10*3/uL (ref 3.4–10.8)

## 2020-05-03 LAB — BASIC METABOLIC PANEL
BUN/Creatinine Ratio: 15 (ref 10–24)
BUN: 22 mg/dL (ref 8–27)
CO2: 24 mmol/L (ref 20–29)
Calcium: 10 mg/dL (ref 8.6–10.2)
Chloride: 98 mmol/L (ref 96–106)
Creatinine, Ser: 1.47 mg/dL — ABNORMAL HIGH (ref 0.76–1.27)
GFR calc Af Amer: 59 mL/min/{1.73_m2} — ABNORMAL LOW (ref >59)
GFR calc non Af Amer: 51 mL/min/{1.73_m2} — ABNORMAL LOW (ref >59)
Glucose: 84 mg/dL (ref 65–99)
Potassium: 4.7 mmol/L (ref 3.5–5.2)
Sodium: 136 mmol/L (ref 134–144)

## 2020-05-03 NOTE — Telephone Encounter (Signed)
I called and spoke with staff at [facility], pre surgery. I have advised them that I am needing a paper copy of the consent form that they use for EP procedures as this has to be signed by his case worker since the patient is a ward of the state.  I have been advised this will be faxed to me.   Waiting on fax to be received.

## 2020-05-04 MED ORDER — SODIUM CHLORIDE 0.9 % IV SOLN
80.0000 mg | INTRAVENOUS | Status: AC
  Administered 2020-05-05: 80 mg
  Filled 2020-05-04: qty 2

## 2020-05-04 MED ORDER — SODIUM CHLORIDE 0.9 % IV SOLN
80.0000 mg | INTRAVENOUS | Status: DC
  Filled 2020-05-04: qty 2

## 2020-05-04 NOTE — Telephone Encounter (Signed)
Late entry:  Attempted again to call Freda, just prior to 5:00 pm. No answer- I left a message to please call back ASAP regarding the patient's consent form.   Dr. Graciela Husbands is aware I do not currently have this in my possession.  DSS contact #'s Freda: [phone] Fax: [phone]   I have checked the fax again at this time and no consent has been obtained.  I have notified Pam, RN of what is going on and she will follow up on this in the AM.

## 2020-05-04 NOTE — Telephone Encounter (Signed)
I have received a copy of the patient's Informed Consent for his procedure tomorrow with Dr. Graciela Husbands.  I attempted to contact Lucille Passy w/ DSS. No answer- I left a message to please call back today.

## 2020-05-04 NOTE — Telephone Encounter (Signed)
No call received back from DSS yet.  I have faxed a copy of the consent to the fax # listed below for Freda: [phone]. Confirmation received.    Staff message also received from Janne Napoleon in the cath lab: Jolyn Nap, RN  Jefferey Pica, RN Did we every get the consent form? If so please fax to cath lab [phone]. I will take to short stay    I asked on the fax that Freda fax this to myself at the office or directly to the cath lab fax # as above.

## 2020-05-05 ENCOUNTER — Ambulatory Visit (HOSPITAL_COMMUNITY): Payer: Medicaid Other | Admitting: Certified Registered"

## 2020-05-05 ENCOUNTER — Ambulatory Visit (HOSPITAL_COMMUNITY)
Admission: RE | Admit: 2020-05-05 | Discharge: 2020-05-05 | Disposition: A | Discharge status: Home / Self Care | Payer: Medicaid Other | Attending: Internal Medicine | Admitting: Internal Medicine

## 2020-05-05 ENCOUNTER — Encounter (HOSPITAL_COMMUNITY): Payer: Self-pay | Admitting: Internal Medicine

## 2020-05-05 ENCOUNTER — Ambulatory Visit (HOSPITAL_COMMUNITY): Payer: Medicaid Other

## 2020-05-05 ENCOUNTER — Encounter (HOSPITAL_COMMUNITY)
Admission: RE | Disposition: A | Discharge status: Home / Self Care | Payer: Medicaid Other | Source: Home / Self Care | Attending: Internal Medicine

## 2020-05-05 ENCOUNTER — Other Ambulatory Visit: Payer: Self-pay

## 2020-05-05 DIAGNOSIS — Z95810 Presence of automatic (implantable) cardiac defibrillator: Secondary | ICD-10-CM

## 2020-05-05 DIAGNOSIS — I130 Hypertensive heart and chronic kidney disease with heart failure and stage 1 through stage 4 chronic kidney disease, or unspecified chronic kidney disease: Secondary | ICD-10-CM | POA: Diagnosis not present

## 2020-05-05 DIAGNOSIS — Z881 Allergy status to other antibiotic agents status: Secondary | ICD-10-CM | POA: Diagnosis not present

## 2020-05-05 DIAGNOSIS — F319 Bipolar disorder, unspecified: Secondary | ICD-10-CM | POA: Insufficient documentation

## 2020-05-05 DIAGNOSIS — Z880 Allergy status to penicillin: Secondary | ICD-10-CM | POA: Insufficient documentation

## 2020-05-05 DIAGNOSIS — I5022 Chronic systolic (congestive) heart failure: Secondary | ICD-10-CM | POA: Insufficient documentation

## 2020-05-05 DIAGNOSIS — Z882 Allergy status to sulfonamides status: Secondary | ICD-10-CM | POA: Diagnosis not present

## 2020-05-05 DIAGNOSIS — Z7901 Long term (current) use of anticoagulants: Secondary | ICD-10-CM | POA: Insufficient documentation

## 2020-05-05 DIAGNOSIS — I4891 Unspecified atrial fibrillation: Secondary | ICD-10-CM | POA: Diagnosis not present

## 2020-05-05 DIAGNOSIS — Z8673 Personal history of transient ischemic attack (TIA), and cerebral infarction without residual deficits: Secondary | ICD-10-CM | POA: Insufficient documentation

## 2020-05-05 DIAGNOSIS — Z951 Presence of aortocoronary bypass graft: Secondary | ICD-10-CM | POA: Insufficient documentation

## 2020-05-05 DIAGNOSIS — I447 Left bundle-branch block, unspecified: Secondary | ICD-10-CM | POA: Diagnosis present

## 2020-05-05 DIAGNOSIS — I255 Ischemic cardiomyopathy: Secondary | ICD-10-CM | POA: Insufficient documentation

## 2020-05-05 DIAGNOSIS — Z4502 Encounter for adjustment and management of automatic implantable cardiac defibrillator: Secondary | ICD-10-CM | POA: Diagnosis not present

## 2020-05-05 HISTORY — PX: BIV UPGRADE: EP1202

## 2020-05-05 SURGERY — BIV UPGRADE
Anesthesia: Monitor Anesthesia Care

## 2020-05-05 MED ORDER — METHYLPREDNISOLONE SODIUM SUCC 125 MG IJ SOLR
125.0000 mg | Freq: Once | INTRAMUSCULAR | Status: AC
  Administered 2020-05-05: 125 mg via INTRAVENOUS

## 2020-05-05 MED ORDER — CHLORHEXIDINE GLUCONATE 4 % EX LIQD
4.0000 "application " | Freq: Once | CUTANEOUS | Status: DC
  Filled 2020-05-05: qty 60

## 2020-05-05 MED ORDER — MIDAZOLAM HCL 5 MG/5ML IJ SOLN
INTRAMUSCULAR | Status: DC | PRN
  Administered 2020-05-05 (×2): 1 mg via INTRAVENOUS

## 2020-05-05 MED ORDER — METHYLPREDNISOLONE SODIUM SUCC 125 MG IJ SOLR
INTRAMUSCULAR | Status: AC
  Filled 2020-05-05: qty 2

## 2020-05-05 MED ORDER — HEPARIN (PORCINE) IN NACL 1000-0.9 UT/500ML-% IV SOLN
INTRAVENOUS | Status: DC | PRN
  Administered 2020-05-05: 500 mL

## 2020-05-05 MED ORDER — SODIUM CHLORIDE 0.9 % IV SOLN: INTRAVENOUS | Status: DC

## 2020-05-05 MED ORDER — ACETAMINOPHEN 325 MG PO TABS: 325.0000 mg | ORAL_TABLET | ORAL | Status: DC | PRN

## 2020-05-05 MED ORDER — DIPHENHYDRAMINE HCL 50 MG/ML IJ SOLN
INTRAMUSCULAR | Status: AC
  Filled 2020-05-05: qty 1

## 2020-05-05 MED ORDER — VANCOMYCIN HCL IN DEXTROSE 1-5 GM/200ML-% IV SOLN
1000.0000 mg | INTRAVENOUS | Status: AC
  Administered 2020-05-05: 1000 mg via INTRAVENOUS

## 2020-05-05 MED ORDER — PROPOFOL 10 MG/ML IV BOLUS
INTRAVENOUS | Status: DC | PRN
  Administered 2020-05-05: 20 mg via INTRAVENOUS

## 2020-05-05 MED ORDER — DIPHENHYDRAMINE HCL 50 MG/ML IJ SOLN
25.0000 mg | Freq: Once | INTRAMUSCULAR | Status: AC
  Administered 2020-05-05: 25 mg via INTRAVENOUS

## 2020-05-05 MED ORDER — ONDANSETRON HCL 4 MG/2ML IJ SOLN: 4.0000 mg | Freq: Four times a day (QID) | INTRAMUSCULAR | Status: DC | PRN

## 2020-05-05 MED ORDER — FUROSEMIDE 10 MG/ML IJ SOLN
INTRAMUSCULAR | Status: AC
  Filled 2020-05-05: qty 8

## 2020-05-05 MED ORDER — FUROSEMIDE 10 MG/ML IJ SOLN
INTRAMUSCULAR | Status: DC | PRN
  Administered 2020-05-05: 80 mg via INTRAMUSCULAR

## 2020-05-05 MED ORDER — PHENYLEPHRINE HCL-NACL 10-0.9 MG/250ML-% IV SOLN
INTRAVENOUS | Status: DC | PRN
  Administered 2020-05-05: 15 ug/min via INTRAVENOUS

## 2020-05-05 MED ORDER — VANCOMYCIN HCL IN DEXTROSE 1-5 GM/200ML-% IV SOLN
INTRAVENOUS | Status: AC
  Filled 2020-05-05: qty 200

## 2020-05-05 MED ORDER — FENTANYL CITRATE (PF) 100 MCG/2ML IJ SOLN
INTRAMUSCULAR | Status: DC | PRN
  Administered 2020-05-05: 50 ug via INTRAVENOUS

## 2020-05-05 MED ORDER — LIDOCAINE HCL (PF) 1 % IJ SOLN
INTRAMUSCULAR | Status: DC | PRN
  Administered 2020-05-05: 60 mL

## 2020-05-05 MED ORDER — SODIUM CHLORIDE 0.9 % IV SOLN
INTRAVENOUS | Status: AC
  Filled 2020-05-05: qty 2

## 2020-05-05 MED ORDER — IOHEXOL 350 MG/ML SOLN
INTRAVENOUS | Status: DC | PRN
  Administered 2020-05-05: 10 mL

## 2020-05-05 MED ORDER — PROPOFOL 500 MG/50ML IV EMUL
INTRAVENOUS | Status: DC | PRN
  Administered 2020-05-05: 50 ug/kg/min via INTRAVENOUS

## 2020-05-05 MED ORDER — LIDOCAINE 2% (20 MG/ML) 5 ML SYRINGE
INTRAMUSCULAR | Status: DC | PRN
  Administered 2020-05-05: 40 mg via INTRAVENOUS

## 2020-05-05 SURGICAL SUPPLY — 18 items
BALLN ATTAIN 80 (BALLOONS) ×4
BALLN ATTAIN 80CM 6215 (BALLOONS) ×2
BALLOON ATTAIN 80 (BALLOONS) ×2 IMPLANT
BLANKET WARM UNDERBOD FULL ACC (MISCELLANEOUS) ×3 IMPLANT
CABLE SURGICAL S-101-97-12 (CABLE) ×3 IMPLANT
CATH CPS DIRECT 135 DS2C020 (CATHETERS) ×3 IMPLANT
HEMOSTAT SURGICEL 2X4 FIBR (HEMOSTASIS) ×3 IMPLANT
ICD CLARIA MRI DTMA1QQ (ICD Generator) ×3 IMPLANT
LEAD QUARTET 1456Q-86 (Lead) ×1 IMPLANT
MAT PREVALON FULL STRYKER (MISCELLANEOUS) ×3 IMPLANT
PAD PRO RADIOLUCENT 2001M-C (PAD) ×3 IMPLANT
QUARTET 1456Q-86 (Lead) ×3 IMPLANT
SHEATH 9.5FR PRELUDE SNAP 13 (SHEATH) ×3 IMPLANT
SHEATH 9FR PRELUDE SNAP 25 (SHEATH) ×3 IMPLANT
SLITTER UNIVERSAL DS2A003 (MISCELLANEOUS) ×3 IMPLANT
TRAY PACEMAKER INSERTION (PACKS) ×3 IMPLANT
WIRE ACUITY WHISPER EDS 4648 (WIRE) ×3 IMPLANT
WIRE HI TORQ VERSACORE-J 145CM (WIRE) ×3 IMPLANT

## 2020-05-05 NOTE — Telephone Encounter (Signed)
Spoke with Jerolyn Center at [facility] office and she explained that she is a Child psychotherapist and it Control and instrumentation engineer in order to give verbal consent. She is trying to reach one of them to try and exopodite this to be done so his procedure does not have to be canceled. She did get to speak with Dr. Graciela Husbands and will have someone call with consent once she reaches them on the phone.

## 2020-05-05 NOTE — Progress Notes (Signed)
Attempted to contact [place] DSS message left for her to call me back.

## 2020-05-05 NOTE — Transfer of Care (Signed)
Immediate Anesthesia Transfer of Care Note  Patient: Carl [name]  Procedure(s) Performed: BIV ICD UPGRADE (N/A )  Patient Location: Cath Lab  Anesthesia Type:MAC  Level of Consciousness: awake, alert , oriented and patient cooperative  Airway & Oxygen Therapy: Patient Spontanous Breathing  Post-op Assessment: Report given to RN and Post -op Vital signs reviewed and stable  Post vital signs: Reviewed and stable  Last Vitals:  Vitals Value Taken Time  BP    Temp    Pulse    Resp    SpO2      Last Pain:  Vitals:   05/05/20 0756  TempSrc:   PainSc: 0-No pain         Complications: No complications documented.

## 2020-05-05 NOTE — Anesthesia Procedure Notes (Signed)
Procedure Name: MAC Date/Time: 05/05/2020 11:25 AM Performed by: Colin Benton, CRNA Pre-anesthesia Checklist: Patient identified, Emergency Drugs available, Suction available and Patient being monitored Patient Re-evaluated:Patient Re-evaluated prior to induction Oxygen Delivery Method: Simple face mask Induction Type: IV induction Placement Confirmation: positive ETCO2 Dental Injury: Teeth and Oropharynx as per pre-operative assessment

## 2020-05-05 NOTE — Telephone Encounter (Signed)
Spoke with Jerolyn Center and she reports they were able to get consent for this patient to proceed with his procedure. She was appreciative for assisting with no further needs at this time.

## 2020-05-05 NOTE — Anesthesia Preprocedure Evaluation (Signed)
Anesthesia Evaluation  Patient identified by MRN, date of birth, ID band Patient awake    Reviewed: Allergy & Precautions, NPO status , Patient's Chart, lab work & pertinent test results  Airway Mallampati: II  TM Distance: >3 FB Neck ROM: Full    Dental no notable dental hx.    Pulmonary COPD, Current Smoker and Patient abstained from smoking.,    Pulmonary exam normal breath sounds clear to auscultation       Cardiovascular hypertension, + CAD, + Past MI and +CHF  Normal cardiovascular exam+ Cardiac Defibrillator  Rhythm:Regular Rate:Normal     Neuro/Psych Seizures -,  Depression Bipolar Disorder CVA negative psych ROS   GI/Hepatic negative GI ROS, Neg liver ROS,   Endo/Other  Hypothyroidism   Renal/GU Renal InsufficiencyRenal disease  negative genitourinary   Musculoskeletal negative musculoskeletal ROS (+)   Abdominal   Peds negative pediatric ROS (+)  Hematology negative hematology ROS (+)   Anesthesia Other Findings   Reproductive/Obstetrics negative OB ROS                             Anesthesia Physical Anesthesia Plan  ASA: IV  Anesthesia Plan: MAC   Post-op Pain Management:    Induction: Intravenous  PONV Risk Score and Plan: 0 and Treatment may vary due to age or medical condition  Airway Management Planned: Simple Face Mask  Additional Equipment:   Intra-op Plan:   Post-operative Plan:   Informed Consent: I have reviewed the patients History and Physical, chart, labs and discussed the procedure including the risks, benefits and alternatives for the proposed anesthesia with the patient or authorized representative who has indicated his/her understanding and acceptance.     Dental advisory given  Plan Discussed with: CRNA  Anesthesia Plan Comments:         Anesthesia Quick Evaluation

## 2020-05-05 NOTE — Progress Notes (Signed)
Consent obtained from the director of [facility] DSS

## 2020-05-05 NOTE — Interval H&P Note (Signed)
History and Physical Interval Note:  05/05/2020 9:23 AM  Carl [name]  has presented today for surgery, with the diagnosis of cardiomyopathy, left bundle branch block.  The various methods of treatment have been discussed with the patient and family. After consideration of risks, benefits and other options for treatment, the patient has consented to  Procedure(s): BIV ICD UPGRADE (N/A) as a surgical intervention.  The patient's history has been reviewed, patient examined, no change in status, stable for surgery.  I have reviewed the patient's chart and labs.  Questions were answered to the patient's satisfaction.     [name]  Pt has dye allergy and has been premedicated Await consent from guardian

## 2020-05-05 NOTE — Telephone Encounter (Signed)
Confirmed with pre procedure that they did in fact receive verbal consent for patient to have procedure. No further needs.

## 2020-05-05 NOTE — Progress Notes (Signed)
Attempted to call Lucille Passy no answer.

## 2020-05-05 NOTE — Telephone Encounter (Signed)
Spoke with Jerolyn Center at [facility] and she inquired about need for procedure. She also wanted to know if they could do verbal consent to have this done. Called over to pre procedure at [facility] and they stated they could in fact do verbal consent. They provided me with their number to pass on to [place]. Will follow up with pre procedure to see if they were able to obtain consent for patient.

## 2020-05-05 NOTE — Discharge Instructions (Signed)
    Supplemental Discharge Instructions for  Pacemaker/Defibrillator Patients  Tomorrow, 05/06/20, send in a device transmission  Activity No heavy lifting or vigorous activity with your left/right arm for 6 to 8 weeks.  Do not raise your left/right arm above your head for one week.  Gradually raise your affected arm as drawn below.             05/09/2020                 05/10/2020                05/11/2020               05/12/2020 __  NO DRIVING for 1 week  ; you may begin driving on   37/02/5884  .  WOUND CARE - Keep the wound area clean and dry.  Do not get this area wet , no showers for 24 hours; you may shower on   05/06/2020 evening. - Dr. Graciela Husbands used DERMABOND to close your wound.  DO NOT peel this off.  Do not rub/scrub the area, pat dry. - DO  NOT apply any creams, oils, or ointments to the wound area.  No bandage is needed on the site - If you notice any drainage or discharge from the wound, any swelling or bruising at the site, or you develop a fever > 101? F after you are discharged home, call the office at once.  Special Instructions - You are still able to use cellular telephones; use the ear opposite the side where you have your pacemaker/defibrillator.  Avoid carrying your cellular phone near your device. - When traveling through airports, show security personnel your identification card to avoid being screened in the metal detectors.  Ask the security personnel to use the hand wand. - Avoid arc welding equipment, MRI testing (magnetic resonance imaging), TENS units (transcutaneous nerve stimulators).  Call the office for questions about other devices. - Avoid electrical appliances that are in poor condition or are not properly grounded. - Microwave ovens are safe to be near or to operate.  Additional information for defibrillator patients should your device go off: - If your device goes off ONCE and you feel fine afterward, notify the device clinic nurses. - If your device  goes off ONCE and you do not feel well afterward, call 911. - If your device goes off TWICE, call 911. - If your device goes off THREE times in one day, call 911.  DO NOT DRIVE YOURSELF OR A FAMILY MEMBER WITH A DEFIBRILLATOR TO THE HOSPITAL--CALL 911.

## 2020-05-05 NOTE — Progress Notes (Signed)
Spoke with Jerolyn Center at [facility] office. States she will have her director call me back. Director is Lady Gary

## 2020-05-08 ENCOUNTER — Encounter (HOSPITAL_COMMUNITY): Payer: Self-pay | Admitting: Internal Medicine

## 2020-05-09 NOTE — Anesthesia Postprocedure Evaluation (Signed)
Anesthesia Post Note  Patient: Carl [name]  Procedure(s) Performed: BIV ICD UPGRADE (N/A )     Patient location during evaluation: PACU Anesthesia Type: MAC Level of consciousness: awake and alert Pain management: pain level controlled Vital Signs Assessment: post-procedure vital signs reviewed and stable Respiratory status: spontaneous breathing, nonlabored ventilation and respiratory function stable Cardiovascular status: blood pressure returned to baseline and stable Postop Assessment: no apparent nausea or vomiting Anesthetic complications: no   No complications documented.  Last Vitals:  Vitals:   05/05/20 1710 05/05/20 1740  BP: (!) 111/59 106/63  Pulse: 63 64  Resp: 18 20  Temp:    SpO2: 100% 97%    Last Pain:  Vitals:   05/05/20 1455  TempSrc:   PainSc: 0-No pain                 Lynda Rainwater

## 2020-05-16 ENCOUNTER — Other Ambulatory Visit: Payer: Self-pay

## 2020-05-16 ENCOUNTER — Ambulatory Visit (INDEPENDENT_AMBULATORY_CARE_PROVIDER_SITE_OTHER): Payer: Medicaid Other | Admitting: Emergency Medicine

## 2020-05-16 ENCOUNTER — Ambulatory Visit
Admission: RE | Admit: 2020-05-16 | Discharge: 2020-05-16 | Disposition: A | Discharge status: Home / Self Care | Payer: Medicaid Other | Source: Ambulatory Visit | Attending: Cardiology | Admitting: Cardiology

## 2020-05-16 DIAGNOSIS — Z95810 Presence of automatic (implantable) cardiac defibrillator: Secondary | ICD-10-CM | POA: Diagnosis not present

## 2020-05-16 DIAGNOSIS — T82110D Breakdown (mechanical) of cardiac electrode, subsequent encounter: Secondary | ICD-10-CM | POA: Diagnosis not present

## 2020-05-16 DIAGNOSIS — I5023 Acute on chronic systolic (congestive) heart failure: Secondary | ICD-10-CM | POA: Diagnosis not present

## 2020-05-16 LAB — CUP PACEART INCLINIC DEVICE CHECK
Battery Remaining Longevity: 56 mo
Battery Voltage: 3.02 V
Brady Statistic AP VP Percent: 39.3 %
Brady Statistic AP VS Percent: 0.64 %
Brady Statistic AS VP Percent: 58.47 %
Brady Statistic AS VS Percent: 1.59 %
Brady Statistic RA Percent Paced: 38.8 %
Brady Statistic RV Percent Paced: 29.71 %
Date Time Interrogation Session: 20211109163717
HighPow Impedance: 69 Ohm
Implantable Lead Implant Date: 20171201
Implantable Lead Implant Date: 20171201
Implantable Lead Implant Date: 20211029
Implantable Lead Location: 753858
Implantable Lead Location: 753859
Implantable Lead Location: 753860
Implantable Lead Model: 5076
Implantable Pulse Generator Implant Date: 20211029
Lead Channel Impedance Value: 180.5 Ohm
Lead Channel Impedance Value: 180.5 Ohm
Lead Channel Impedance Value: 205.114
Lead Channel Impedance Value: 205.114
Lead Channel Impedance Value: 205.114
Lead Channel Impedance Value: 361 Ohm
Lead Channel Impedance Value: 361 Ohm
Lead Channel Impedance Value: 361 Ohm
Lead Channel Impedance Value: 418 Ohm
Lead Channel Impedance Value: 475 Ohm
Lead Channel Impedance Value: 532 Ohm
Lead Channel Impedance Value: 551 Ohm
Lead Channel Impedance Value: 589 Ohm
Lead Channel Impedance Value: 703 Ohm
Lead Channel Impedance Value: 722 Ohm
Lead Channel Impedance Value: 722 Ohm
Lead Channel Impedance Value: 874 Ohm
Lead Channel Impedance Value: 950 Ohm
Lead Channel Pacing Threshold Amplitude: 0.5 V
Lead Channel Pacing Threshold Amplitude: 1.5 V
Lead Channel Pacing Threshold Amplitude: 2 V
Lead Channel Pacing Threshold Pulse Width: 0.4 ms
Lead Channel Pacing Threshold Pulse Width: 0.4 ms
Lead Channel Pacing Threshold Pulse Width: 1 ms
Lead Channel Sensing Intrinsic Amplitude: 1.1 mV
Lead Channel Sensing Intrinsic Amplitude: 21 mV
Lead Channel Setting Pacing Amplitude: 1.5 V
Lead Channel Setting Pacing Amplitude: 4.5 V
Lead Channel Setting Pacing Amplitude: 6 V
Lead Channel Setting Pacing Pulse Width: 0.4 ms
Lead Channel Setting Pacing Pulse Width: 0.4 ms
Lead Channel Setting Sensing Sensitivity: 0.3 mV

## 2020-05-16 MED ORDER — CLINDAMYCIN HCL 150 MG PO CAPS: 300.0000 mg | ORAL_CAPSULE | Freq: Three times a day (TID) | ORAL | Status: DC

## 2020-05-16 NOTE — Patient Instructions (Signed)
Please call if you see increased redness, drainage, fever or chills. Please do not touch you site. Wash with warm soup and water, clean wash cloth each time and pat dry. Keep site clean and dry.

## 2020-05-16 NOTE — Progress Notes (Signed)
CRT-D device/wound check in office. Dermabond was removed prior to OV by patient. Wound has appox. 1.5 cm of open area to distal end of wound with scabbing noted over site. Redness noted around site. No pain or tenderness noted. Patient reports of clear drainage today to site, none present at OV. Patient reports of picking at site and appears to not healed well after per patient. Patient encouraged to not touch site and avoid in hopes to reduce any further possible infection. Wound care education provided to patient.  Dr. Estill Dooms made aware and assess wound himself. VO to start patient on Clindymacin, sent to pharmacy. Sensing and impedance trends stable over time. LV threshold elevated 3.5 mV @ 0.4 ms. Expended LV pulse width to 1.0 ms and received 2.0 V output. [employer] rep Kathlene November present to assist. CXR ordered, VO from Dr. Elberta Fortis.  Patient not dependent. AT/AF burden <0.1%, no episodes noted, + Eliquis. No ventricular arrhythmia episodes recorded. Histogram distribution appropriate for patient and level of activity. Patient educated about wound care, arm mobility, lifting restrictions, shock plan. ROV in 3 months with Dr. Graciela Husbands. Patient education completed including shock plan.  CXR images have resulted. Dr. Elberta Fortis message sent to make aware.  Patient has follow up with Dr. Graciela Husbands next week (05/25/20) at [facility]. Office. Patient and visitor aware of location, date and time.  Patient did not receive new remote monitor at implant. Kem aware and will call [employer] to a monitor sent to patient.

## 2020-05-17 ENCOUNTER — Telehealth: Payer: Self-pay

## 2020-05-17 MED ORDER — CLINDAMYCIN HCL 300 MG PO CAPS: 300.0000 mg | ORAL_CAPSULE | Freq: Three times a day (TID) | ORAL | 0 refills | Status: AC

## 2020-05-17 NOTE — Addendum Note (Signed)
Addended by: Judy Pimple E on: 05/17/2020 10:41 AM   Modules accepted: Orders

## 2020-05-17 NOTE — Telephone Encounter (Signed)
I had to order the pt a new monitor because the old monitor was not assigned at implant date. It will take 7-10 business days for the monitor to get to the patient.

## 2020-05-17 NOTE — Progress Notes (Signed)
Rx not sent to pharmacy, ordered as clinic admisitered.  Updated Rx and sent to [facility] pharmacy.

## 2020-05-22 DIAGNOSIS — I5022 Chronic systolic (congestive) heart failure: Secondary | ICD-10-CM | POA: Insufficient documentation

## 2020-05-22 DIAGNOSIS — I255 Ischemic cardiomyopathy: Secondary | ICD-10-CM | POA: Insufficient documentation

## 2020-05-22 DIAGNOSIS — I5042 Chronic combined systolic (congestive) and diastolic (congestive) heart failure: Secondary | ICD-10-CM | POA: Insufficient documentation

## 2020-05-25 ENCOUNTER — Encounter: Payer: Self-pay | Admitting: Internal Medicine

## 2020-05-25 ENCOUNTER — Ambulatory Visit (INDEPENDENT_AMBULATORY_CARE_PROVIDER_SITE_OTHER): Payer: Medicaid Other | Admitting: Internal Medicine

## 2020-05-25 ENCOUNTER — Other Ambulatory Visit: Payer: Self-pay

## 2020-05-25 VITALS — BP 110/80 | HR 79 | Ht 69.0 in | Wt 203.8 lb

## 2020-05-25 DIAGNOSIS — I255 Ischemic cardiomyopathy: Secondary | ICD-10-CM

## 2020-05-25 DIAGNOSIS — I5022 Chronic systolic (congestive) heart failure: Secondary | ICD-10-CM

## 2020-05-25 NOTE — Patient Instructions (Addendum)
Medication Instructions:  Your physician recommends that you continue on your current medications as directed. Please refer to the Current Medication list given to you today.  *If you need a refill on your cardiac medications before your next appointment, please call your pharmacy*   Lab Work: None ordered.  If you have labs (blood work) drawn today and your tests are completely normal, you will receive your results only by: [name] MyChart Message (if you have MyChart) OR . A paper copy in the mail If you have any lab test that is abnormal or we need to change your treatment, we will call you to review the results.   Testing/Procedures: None ordered.    Follow-Up: At [facility], you and your health needs are our priority.  As part of our continuing mission to provide you with exceptional heart care, we have created designated Provider Care Teams.  These Care Teams include your primary Cardiologist (physician) and Advanced Practice Providers (APPs -  Physician Assistants and Nurse Practitioners) who all work together to provide you with the care you need, when you need it.  We recommend signing up for the patient portal called "MyChart".  Sign up information is provided on this After Visit Summary.  MyChart is used to connect with patients for Virtual Visits (Telemedicine).  Patients are able to view lab/test results, encounter notes, upcoming appointments, etc.  Non-urgent messages can be sent to your provider as well.   To learn more about what you can do with MyChart, go to [email or web address].    Your next appointment:   As scheduled   Other Instructions Wet to dry dressings 4 times daily as discussed by Dr Graciela Husbands

## 2020-05-25 NOTE — Progress Notes (Signed)
Patient Care Team: [facility] as PCP - General (Geriatric Medicine) Carl Ouch, MD as PCP - Cardiology (Cardiology) Carl Salvia, MD as PCP - Electrophysiology (Cardiology)   HPI  Carl [name] is a 59 y.o. male seen in follow-up for dual-chamber ICD with intercurrent left bundle branch block and the issue raised at [facility] following CRT upgrade. 10/21   He was seen following device implantation and was noted to have a superficial infection along the lateral incision he was started on antibiotics  History of SCAF detected on the device.  Greater than 6 hours.  Started on Eliquis.  Intercurrent hospitalization for community-acquired pneumonia complicated by CHF exacerbation.  Intercurrently started on Entresto.  With clinical improvement but still with moderate shortness of breath    He has a state as his legal guardian.  DATE TEST EF   6/17 LHC    % LAD p-80, D1-80, CX m-80 RCA m >>DESx2  4/20 Echo   20-25% %   5/21 Echo  25-30%   6/21 Myoview 23% Antero/infero apical Defect fixed   7/21 Echo  25-30% MR mild-mod   Date Cr K Hgb  5/21 1.4<<1.02 4.5 14.2          Thromboembolic risk factors (, HTN-1, TIA/CVA-2, Vasc disease -1, CHF-1) for a CHADSVASc Score of .>=5  Records and Results Reviewed   Past Medical History:  Diagnosis Date  . Acute pulmonary edema (HCC) 2017  . Acute respiratory failure with hypoxia (HCC) 2017  . Bipolar 1 disorder (HCC)   . CHF (congestive heart failure) (HCC)   . Chronic kidney disease   . COPD (chronic obstructive pulmonary disease) (HCC)   . Depression   . Hypertension   . Myocardial infarction (HCC)   . NSTEMI (non-ST elevated myocardial infarction) (HCC)   . 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 Seizures (HCC)    childhood  . Stroke (HCC)   . Tobacco abuse     Past Surgical History:  Procedure Laterality Date  . BIV UPGRADE N/A 05/05/2020   Procedure: BIV  ICD UPGRADE;  Surgeon: Carl Salvia, MD;  Location: [facility] INVASIVE CV LAB;  Service: Cardiovascular;  Laterality: N/A;  . CORONARY ARTERY BYPASS GRAFT      Current Meds  Medication Sig  . acetaminophen (TYLENOL) 500 MG tablet Take 500 mg by mouth every 6 (six) hours as needed for mild pain.  [name] albuterol (VENTOLIN HFA) 108 (90 Base) MCG/ACT inhaler Inhale 2 puffs into the lungs every 6 (six) hours as needed for wheezing.  [name] apixaban (ELIQUIS) 5 MG TABS tablet Take 1 tablet (5 mg total) by mouth 2 (two) times daily.  . carvedilol (COREG) 6.25 MG tablet Take 1 tablet (6.25 mg total) by mouth 2 (two) times daily.  . divalproex (DEPAKOTE ER) 500 MG 24 hr tablet Take 1 tablet (500 mg total) by mouth 2 (two) times daily.  [name] escitalopram (LEXAPRO) 10 MG tablet Take 1 tablet (10 mg total) by mouth at bedtime.  . fenofibrate 160 MG tablet Take 160 mg by mouth daily.  . Fluticasone-Salmeterol (ADVAIR DISKUS) 250-50 MCG/DOSE AEPB Inhale 1 puff into the lungs 2 (two) times daily.  . furosemide (LASIX) 20 MG tablet Take 1 tablet (20 mg total) by mouth daily.  [name] levETIRAcetam (KEPPRA) 250 MG tablet Take 1 tablet (250 mg total) by mouth 2 (two) times daily.  [name] levothyroxine (SYNTHROID) 25 MCG tablet Take 1 tablet (25 mcg total)  by mouth daily.  [name] lisinopril (ZESTRIL) 2.5 MG tablet Take 1 tablet (2.5 mg total) by mouth daily.  . nicotine (NICODERM CQ - DOSED IN MG/24 HOURS) 21 mg/24hr patch Place 1 patch (21 mg total) onto the skin daily.  [name] omeprazole (PRILOSEC) 20 MG capsule Take 20 mg by mouth daily.  . QUEtiapine (SEROQUEL) 100 MG tablet Take 100 mg by mouth at bedtime.  [name] QUEtiapine (SEROQUEL) 50 MG tablet Take 50 mg by mouth in the morning.  . simvastatin (ZOCOR) 40 MG tablet Take 40 mg by mouth at bedtime.  [name] tiotropium (SPIRIVA) 18 MCG inhalation capsule Place 18 mcg into inhaler and inhale daily.    Allergies  Allergen Reactions  . Iodinated Diagnostic Agents Shortness Of Breath  . Penicillins  Anaphylaxis and Shortness Of Breath    Respiratory  Tolerated cefuroxime on 01/06/16  . Strawberry Extract Anaphylaxis  . Cefepime Itching    Empiric antibiotic, developed pruritis.   . Erythromycin Itching  . Sulfa Antibiotics Itching, Nausea And Vomiting and Nausea Only      Review of Systems negative except from HPI and PMH  Physical Exam BP 110/80   [name] 79   Ht 5\' 9"  (1.753 m)   Wt 203 lb 12.8 oz (92.4 kg)   SpO2 97%   BMI 30.10 kg/m  Well developed and well nourished in no acute distress HENT normal Neck supple with JVP-flat Clear Device pocket has lateral separation of the incision with an eschar over the spacing of about 2 cm.  There is minimal surrounding erythema and no tenderness Regular rate and rhythm, no  gallop No  murmur Abd-soft with active BS No Clubbing cyanosis  edema Skin-warm and dry A & Oriented  Grossly normal sensory and motor function       Assessment and  Plan  Ischemic cardiomyopathy-'s s/p CABG plus mitral valve ring  Congestive heart failure-chronic-systolic-class IIb-IIIa  Implantable defibrillator-dual-chamber-[employer]  Left bundle branch block  Atrial fibrillation-subclinical  Strokes-multiple  Bipolar disorder  Renal insufficiency acute/chronic   There is poor healing and concern of device infection.  There is clearly superficial incisional infection.  We will begin wet-to-dry dressings and plan to evaluate the wound again next week.  Threshold was high in the 2 3 configuration of the device was reprogrammed 1-coil

## 2020-05-30 ENCOUNTER — Ambulatory Visit (INDEPENDENT_AMBULATORY_CARE_PROVIDER_SITE_OTHER): Payer: Medicaid Other | Admitting: Internal Medicine

## 2020-05-30 ENCOUNTER — Other Ambulatory Visit: Payer: Self-pay

## 2020-05-30 ENCOUNTER — Encounter: Payer: Self-pay | Admitting: Internal Medicine

## 2020-05-30 VITALS — BP 126/72 | HR 81 | Ht 69.0 in | Wt 203.0 lb

## 2020-05-30 DIAGNOSIS — I255 Ischemic cardiomyopathy: Secondary | ICD-10-CM

## 2020-05-30 DIAGNOSIS — I10 Essential (primary) hypertension: Secondary | ICD-10-CM

## 2020-05-30 DIAGNOSIS — I5022 Chronic systolic (congestive) heart failure: Secondary | ICD-10-CM | POA: Diagnosis not present

## 2020-05-30 DIAGNOSIS — Z95810 Presence of automatic (implantable) cardiac defibrillator: Secondary | ICD-10-CM | POA: Diagnosis not present

## 2020-05-30 DIAGNOSIS — I480 Paroxysmal atrial fibrillation: Secondary | ICD-10-CM

## 2020-05-30 DIAGNOSIS — I447 Left bundle-branch block, unspecified: Secondary | ICD-10-CM | POA: Diagnosis not present

## 2020-05-30 NOTE — Patient Instructions (Signed)
Medication Instructions:  - Your physician recommends that you continue on your current medications as directed. Please refer to the Current Medication list given to you today.  *If you need a refill on your cardiac medications before your next appointment, please call your pharmacy*   Lab Work: - none ordered  If you have labs (blood work) drawn today and your tests are completely normal, you will receive your results only by: [name] MyChart Message (if you have MyChart) OR . A paper copy in the mail If you have any lab test that is abnormal or we need to change your treatment, we will call you to review the results.   Testing/Procedures: - none ordered   Follow-Up: At [facility], you and your health needs are our priority.  As part of our continuing mission to provide you with exceptional heart care, we have created designated Provider Care Teams.  These Care Teams include your primary Cardiologist (physician) and Advanced Practice Providers (APPs -  Physician Assistants and Nurse Practitioners) who all work together to provide you with the care you need, when you need it.  We recommend signing up for the patient portal called "MyChart".  Sign up information is provided on this After Visit Summary.  MyChart is used to connect with patients for Virtual Visits (Telemedicine).  Patients are able to view lab/test results, encounter notes, upcoming appointments, etc.  Non-urgent messages can be sent to your provider as well.   To learn more about what you can do with MyChart, go to [email or web address].    Your next appointment:   2 week(s)- wound check  The format for your next appointment:   In Person  Provider:   [name], MD   Other Instructions

## 2020-05-30 NOTE — Progress Notes (Signed)
Seen in follow-up for an eschar on the lateral aspect of his incision following CRT upgrade 05/05/2020  Eschar seems some smaller.  Measured today and its transverse diameter at 7/8" minimal erythema and no tenderness  Continue wet-to-dry we will see again in 2 weeks

## 2020-06-13 ENCOUNTER — Other Ambulatory Visit: Payer: Self-pay

## 2020-06-13 ENCOUNTER — Ambulatory Visit (INDEPENDENT_AMBULATORY_CARE_PROVIDER_SITE_OTHER): Payer: Medicaid Other | Admitting: Internal Medicine

## 2020-06-13 ENCOUNTER — Encounter: Payer: Self-pay | Admitting: Internal Medicine

## 2020-06-13 VITALS — BP 104/68 | HR 70 | Ht 69.0 in | Wt 202.0 lb

## 2020-06-13 DIAGNOSIS — I480 Paroxysmal atrial fibrillation: Secondary | ICD-10-CM

## 2020-06-13 DIAGNOSIS — I447 Left bundle-branch block, unspecified: Secondary | ICD-10-CM

## 2020-06-13 DIAGNOSIS — I5022 Chronic systolic (congestive) heart failure: Secondary | ICD-10-CM

## 2020-06-13 DIAGNOSIS — I255 Ischemic cardiomyopathy: Secondary | ICD-10-CM

## 2020-06-13 DIAGNOSIS — I10 Essential (primary) hypertension: Secondary | ICD-10-CM

## 2020-06-13 DIAGNOSIS — Z95810 Presence of automatic (implantable) cardiac defibrillator: Secondary | ICD-10-CM

## 2020-06-13 NOTE — Patient Instructions (Signed)
Medication Instructions:  - Your physician has recommended you make the following change in your medication:   1) ok to stop nicotine patches  *If you need a refill on your cardiac medications before your next appointment, please call your pharmacy*   Lab Work: - none ordered  If you have labs (blood work) drawn today and your tests are completely normal, you will receive your results only by: [name] MyChart Message (if you have MyChart) OR . A paper copy in the mail If you have any lab test that is abnormal or we need to change your treatment, we will call you to review the results.   Testing/Procedures: - none ordered   Follow-Up: At [facility], you and your health needs are our priority.  As part of our continuing mission to provide you with exceptional heart care, we have created designated Provider Care Teams.  These Care Teams include your primary Cardiologist (physician) and Advanced Practice Providers (APPs -  Physician Assistants and Nurse Practitioners) who all work together to provide you with the care you need, when you need it.  We recommend signing up for the patient portal called "MyChart".  Sign up information is provided on this After Visit Summary.  MyChart is used to connect with patients for Virtual Visits (Telemedicine).  Patients are able to view lab/test results, encounter notes, upcoming appointments, etc.  Non-urgent messages can be sent to your provider as well.   To learn more about what you can do with MyChart, go to [email or web address].    Your next appointment:   2 week(s)- wound check only (ok to double book)  The format for your next appointment:   In Person  Provider:   [name], MD   Other Instructions n/a

## 2020-06-13 NOTE — Progress Notes (Signed)
Seen in follow-up for an eschar on the lateral aspect of his incision following CRT upgrade 05/05/2020  Eschar much smaller.  Continue wet-to-dry dressings will check again in 2 weeks

## 2020-06-27 ENCOUNTER — Encounter: Payer: Self-pay | Admitting: Internal Medicine

## 2020-06-27 ENCOUNTER — Ambulatory Visit (INDEPENDENT_AMBULATORY_CARE_PROVIDER_SITE_OTHER): Payer: Medicaid Other | Admitting: Internal Medicine

## 2020-06-27 ENCOUNTER — Other Ambulatory Visit: Payer: Self-pay

## 2020-06-27 VITALS — BP 90/60 | HR 88 | Ht 71.0 in | Wt 202.1 lb

## 2020-06-27 DIAGNOSIS — Z950 Presence of cardiac pacemaker: Secondary | ICD-10-CM

## 2020-06-27 NOTE — Progress Notes (Signed)
Seen in follow-up for slowly healing wound following device implantation.  Much improved.  Small scab about 1/2 cm in diameter.

## 2020-06-27 NOTE — Patient Instructions (Signed)
Medication Instructions:  - Your physician recommends that you continue on your current medications as directed. Please refer to the Current Medication list given to you today.  *If you need a refill on your cardiac medications before your next appointment, please call your pharmacy*   Lab Work: - none ordered  If you have labs (blood work) drawn today and your tests are completely normal, you will receive your results only by: [name] MyChart Message (if you have MyChart) OR . A paper copy in the mail If you have any lab test that is abnormal or we need to change your treatment, we will call you to review the results.   Testing/Procedures: - none ordered   Follow-Up: At [facility], you and your health needs are our priority.  As part of our continuing mission to provide you with exceptional heart care, we have created designated Provider Care Teams.  These Care Teams include your primary Cardiologist (physician) and Advanced Practice Providers (APPs -  Physician Assistants and Nurse Practitioners) who all work together to provide you with the care you need, when you need it.  We recommend signing up for the patient portal called "MyChart".  Sign up information is provided on this After Visit Summary.  MyChart is used to connect with patients for Virtual Visits (Telemedicine).  Patients are able to view lab/test results, encounter notes, upcoming appointments, etc.  Non-urgent messages can be sent to your provider as well.   To learn more about what you can do with MyChart, go to [email or web address].    Your next appointment:   As scheduled in January    The format for your next appointment:   In Person  Provider:   [name], MD   Other Instructions n/a

## 2020-06-29 ENCOUNTER — Ambulatory Visit: Payer: Medicaid Other | Admitting: Family

## 2020-07-19 ENCOUNTER — Ambulatory Visit: Payer: Medicaid Other | Admitting: Family

## 2020-07-24 ENCOUNTER — Telehealth: Payer: Self-pay

## 2020-07-24 NOTE — Telephone Encounter (Signed)
Carelink alert- Alert for AT/AF >= 6 hr for 3 days.; AF burden 5.7%; longest logged 10 hours; EGMs suggest A. Fib - ongoing;. Per V rate histogram ~55% binned >100 bpm.  Known PAF on apixaban and carvedilol.   Attempted to reach pt, primary # on record if for [facility] with [facility] care.  Left message requesting she callback.  We will need a manual transmission to determine if R has converted;  Assess symptoms and med compliance.     '

## 2020-07-26 NOTE — Telephone Encounter (Signed)
Follow-up transmission received, at time of transmission 07/24/20 8pm, pt was still in AF.    Spoke with Leanne Chang at [employer] care.  She reports pt feeling good, no cardiac complaints.  She will have patient send another transmission to determine if still in AF.  She also confirmed patient compliance with meds as ordered including: Eliquis 5mg  BID and Carvedilol 6.25mg  BID.  Pt is also taking Furosemide 20mg  daily as prescribed.

## 2020-07-26 NOTE — Telephone Encounter (Signed)
Follow-up trasnmission received, pt appears to be in AF/ AFl still.  Forwarding to MD for recommednation since pt is asymptomatic for both AF and Fluid retention.

## 2020-07-28 MED ORDER — CARVEDILOL 12.5 MG PO TABS: 12.5000 mg | ORAL_TABLET | Freq: Two times a day (BID) | ORAL | 5 refills | Status: DC

## 2020-07-28 NOTE — Telephone Encounter (Signed)
Ladies  can we 1) increase his carvedilol from 6.25>>12.5 for HR control 2) see an AP for DCCV  Thanks SK

## 2020-07-28 NOTE — Telephone Encounter (Signed)
Spoke with Leanne Chang, pt caregiver.  She v/u to increase dose of Carvedilol.  Advised I can have scheduling contact her to set up appt with APP.  She states that patient is scheduled to see Dr. Graciela Husbands on Tuesday 1/25.  They will keep that appt.

## 2020-08-01 ENCOUNTER — Ambulatory Visit (INDEPENDENT_AMBULATORY_CARE_PROVIDER_SITE_OTHER): Payer: Medicaid Other | Admitting: Internal Medicine

## 2020-08-01 ENCOUNTER — Encounter: Payer: Self-pay | Admitting: Internal Medicine

## 2020-08-01 ENCOUNTER — Other Ambulatory Visit: Payer: Self-pay

## 2020-08-01 VITALS — BP 110/60 | HR 86 | Ht 69.0 in | Wt 201.2 lb

## 2020-08-01 DIAGNOSIS — I5022 Chronic systolic (congestive) heart failure: Secondary | ICD-10-CM

## 2020-08-01 DIAGNOSIS — I447 Left bundle-branch block, unspecified: Secondary | ICD-10-CM | POA: Diagnosis not present

## 2020-08-01 DIAGNOSIS — Z95810 Presence of automatic (implantable) cardiac defibrillator: Secondary | ICD-10-CM | POA: Diagnosis not present

## 2020-08-01 DIAGNOSIS — I255 Ischemic cardiomyopathy: Secondary | ICD-10-CM

## 2020-08-01 MED ORDER — FUROSEMIDE 40 MG PO TABS: 40.0000 mg | ORAL_TABLET | Freq: Every day | ORAL | 3 refills | Status: DC

## 2020-08-01 NOTE — Patient Instructions (Signed)
Medication Instructions:  - Your physician has recommended you make the following change in your medication:   1) INCREASE lasix (furosemide) to 40 mg- take 1 tablet by mouth once daily   *If you need a refill on your cardiac medications before your next appointment, please call your pharmacy*   Lab Work: - none ordered  If you have labs (blood work) drawn today and your tests are completely normal, you will receive your results only by: [name] MyChart Message (if you have MyChart) OR . A paper copy in the mail If you have any lab test that is abnormal or we need to change your treatment, we will call you to review the results.   Testing/Procedures: - Your physician has requested that you have an echocardiogram. Echocardiography is a painless test that uses sound waves to create images of your heart. It provides your doctor with information about the size and shape of your heart and how well your heart's chambers and valves are working. This procedure takes approximately one hour. There are no restrictions for this procedure.There is a possibility that an IV may need to be started during your test to inject an image enhancing agent. This is done to obtain more optimal pictures of your heart. Therefore we ask that you do at least drink some water prior to coming in to hydrate your veins.     Follow-Up: At [facility], you and your health needs are our priority.  As part of our continuing mission to provide you with exceptional heart care, we have created designated Provider Care Teams.  These Care Teams include your primary Cardiologist (physician) and Advanced Practice Providers (APPs -  Physician Assistants and Nurse Practitioners) who all work together to provide you with the care you need, when you need it.  We recommend signing up for the patient portal called "MyChart".  Sign up information is provided on this After Visit Summary.  MyChart is used to connect with patients for Virtual  Visits (Telemedicine).  Patients are able to view lab/test results, encounter notes, upcoming appointments, etc.  Non-urgent messages can be sent to your provider as well.   To learn more about what you can do with MyChart, go to [email or web address].    Your next appointment:   3 month(s)  The format for your next appointment:   In Person  Provider:   [name], MD   Other Instructions   Echocardiogram An echocardiogram is a test that uses sound waves (ultrasound) to produce images of the heart. Images from an echocardiogram can provide important information about:  Heart size and shape.  The size and thickness and movement of your heart's walls.  Heart muscle function and strength.  Heart valve function or if you have stenosis. Stenosis is when the heart valves are too narrow.  If blood is flowing backward through the heart valves (regurgitation).  A tumor or infectious growth around the heart valves.  Areas of heart muscle that are not working well because of poor blood flow or injury from a heart attack.  Aneurysm detection. An aneurysm is a weak or damaged part of an artery wall. The wall bulges out from the normal force of blood pumping through the body. Tell a health care provider about:  Any allergies you have.  All medicines you are taking, including vitamins, herbs, eye drops, creams, and over-the-counter medicines.  Any blood disorders you have.  Any surgeries you have had.  Any medical conditions you have.  Whether you  are pregnant or may be pregnant. What are the risks? Generally, this is a safe test. However, problems may occur, including an allergic reaction to dye (contrast) that may be used during the test. What happens before the test? No specific preparation is needed. You may eat and drink normally. What happens during the test?  You will take off your clothes from the waist up and put on a hospital gown.  Electrodes or  electrocardiogram (ECG)patches may be placed on your chest. The electrodes or patches are then connected to a device that monitors your heart rate and rhythm.  You will lie down on a table for an ultrasound exam. A gel will be applied to your chest to help sound waves pass through your skin.  A handheld device, called a transducer, will be pressed against your chest and moved over your heart. The transducer produces sound waves that travel to your heart and bounce back (or "echo" back) to the transducer. These sound waves will be captured in real-time and changed into images of your heart that can be viewed on a video monitor. The images will be recorded on a computer and reviewed by your health care provider.  You may be asked to change positions or hold your breath for a short time. This makes it easier to get different views or better views of your heart.  In some cases, you may receive contrast through an IV in one of your veins. This can improve the quality of the pictures from your heart. The procedure may vary among health care providers and hospitals.   What can I expect after the test? You may return to your normal, everyday life, including diet, activities, and medicines, unless your health care provider tells you not to do that. Follow these instructions at home:  It is up to you to get the results of your test. Ask your health care provider, or the department that is doing the test, when your results will be ready.  Keep all follow-up visits. This is important. Summary  An echocardiogram is a test that uses sound waves (ultrasound) to produce images of the heart.  Images from an echocardiogram can provide important information about the size and shape of your heart, heart muscle function, heart valve function, and other possible heart problems.  You do not need to do anything to prepare before this test. You may eat and drink normally.  After the echocardiogram is completed, you  may return to your normal, everyday life, unless your health care provider tells you not to do that. This information is not intended to replace advice given to you by your health care provider. Make sure you discuss any questions you have with your health care provider. Document Revised: 02/15/2020 Document Reviewed: 02/15/2020 Elsevier Patient Education  2021 [employer].

## 2020-08-01 NOTE — Progress Notes (Signed)
Patient Care Team: [facility]calls, Doctors Making as PCP - General (Geriatric Medicine) Iran Ouch, MD as PCP - Cardiology (Cardiology) Duke Salvia, MD as PCP - Electrophysiology (Cardiology)   HPI  Carl [name] is a 60 y.o. male seen in follow-up for dual-chamber ICD with intercurrent left bundle branch block and the issue raised at [facility] following CRT upgrade. 10/21  Slow healing and great care at the facility >>> well healed  Intermittent DOE without chest pain, occ edema  No palps       SCAF detected on the device.  Greater than 6 hours.  Started on Eliquis. Now recurrent episodes lasting days     Previously on Entresto stopped because of weakness and hypotension    He has a state is his legal guardian.  DATE TEST EF   6/17 LHC    % LAD p-80, D1-80, CX m-80 RCA m >>DESx2  4/20 Echo   20-25% %   5/21 Echo  25-30%   6/21 Myoview 23% Antero/infero apical Defect fixed   7/21 Echo  25-30% MR mild-mod   Date Cr K Hgb  5/21 1.4<<1.02 4.5 14.2          Thromboembolic risk factors (, HTN-1, TIA/CVA-2, Vasc disease -1, CHF-1) for a CHADSVASc Score of .>=5  Records and Results Reviewed   Past Medical History:  Diagnosis Date  . Acute pulmonary edema (HCC) 2017  . Acute respiratory failure with hypoxia (HCC) 2017  . Bipolar 1 disorder (HCC)   . CHF (congestive heart failure) (HCC)   . Chronic kidney disease   . COPD (chronic obstructive pulmonary disease) (HCC)   . Depression   . Hypertension   . Myocardial infarction (HCC)   . NSTEMI (non-ST elevated myocardial infarction) (HCC)   . 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 Seizures (HCC)    childhood  . Stroke (HCC)   . Tobacco abuse     Past Surgical History:  Procedure Laterality Date  . BIV UPGRADE N/A 05/05/2020   Procedure: BIV ICD UPGRADE;  Surgeon: Duke Salvia, MD;  Location: [facility] INVASIVE CV LAB;  Service: Cardiovascular;  Laterality: N/A;  . CORONARY  ARTERY BYPASS GRAFT      Current Meds  Medication Sig  . acetaminophen (TYLENOL) 500 MG tablet Take 500 mg by mouth every 6 (six) hours as needed for mild pain.  [name] albuterol (VENTOLIN HFA) 108 (90 Base) MCG/ACT inhaler Inhale 2 puffs into the lungs every 6 (six) hours as needed for wheezing.  [name] apixaban (ELIQUIS) 5 MG TABS tablet Take 1 tablet (5 mg total) by mouth 2 (two) times daily.  . carvedilol (COREG) 12.5 MG tablet Take 1 tablet (12.5 mg total) by mouth 2 (two) times daily.  . divalproex (DEPAKOTE ER) 500 MG 24 hr tablet Take 1 tablet (500 mg total) by mouth 2 (two) times daily.  [name] escitalopram (LEXAPRO) 10 MG tablet Take 1 tablet (10 mg total) by mouth at bedtime.  . fenofibrate 160 MG tablet Take 160 mg by mouth daily.  . Fluticasone-Salmeterol (ADVAIR DISKUS) 250-50 MCG/DOSE AEPB Inhale 1 puff into the lungs 2 (two) times daily.  . furosemide (LASIX) 20 MG tablet Take 1 tablet (20 mg total) by mouth daily.  [name] levETIRAcetam (KEPPRA) 250 MG tablet Take 1 tablet (250 mg total) by mouth 2 (two) times daily.  [name] levothyroxine (SYNTHROID) 25 MCG tablet Take 1 tablet (25 mcg total) by mouth daily.  [name] lisinopril (ZESTRIL)  2.5 MG tablet Take 1 tablet (2.5 mg total) by mouth daily.  [name] omeprazole (PRILOSEC) 20 MG capsule Take 20 mg by mouth daily.  . QUEtiapine (SEROQUEL) 100 MG tablet Take 100 mg by mouth at bedtime.  [name] QUEtiapine (SEROQUEL) 50 MG tablet Take 50 mg by mouth in the morning.  . simvastatin (ZOCOR) 40 MG tablet Take 40 mg by mouth at bedtime.  [name] tiotropium (SPIRIVA) 18 MCG inhalation capsule Place 18 mcg into inhaler and inhale daily.    Allergies  Allergen Reactions  . Iodinated Diagnostic Agents Shortness Of Breath  . Penicillins Anaphylaxis and Shortness Of Breath    Respiratory  Tolerated cefuroxime on 01/06/16  . Strawberry Extract Anaphylaxis  . Cefepime Itching    Empiric antibiotic, developed pruritis.   . Erythromycin Itching  . Sulfa Antibiotics Itching, Nausea  And Vomiting and Nausea Only      Review of Systems negative except from HPI and PMH  Physical Exam BP 110/60 (BP Location: Left Arm, Patient Position: Sitting, Cuff Size: Normal)   Pulse 86   Ht 5\' 9"  (1.753 m)   Wt 201 lb 4 oz (91.3 kg)   SpO2 98%   BMI 29.72 kg/m  Well developed and well nourished in no acute distress HENT normal Neck supple with JVP-8-10 Basilar crackles Device pocket well healed; without hematoma or erythema.  There is no tethering  Regular rate and rhythm, no  gallop No  murmur Abd-soft with active BS No Clubbing cyanosis 1+ edema Skin-warm and dry A & Oriented  Grossly normal sensory and motor function  ECG sinus with P synchronous pacing upright QRS in V1 negative QRS lead one  Assessment and  Plan  Ischemic cardiomyopathy-'s s/p CABG plus mitral valve ring  Congestive heart failure-chronic-systolic-class IIb-IIIa  Implantable defibrillator-dual-chamber-Medtronic  Left bundle branch block  Atrial fibrillation-paroxysmal   strokes-multiple  Bipolar disorder  Renal insufficiency acute/chronic  Without symptoms of ischemia--some of his exercise intolerance might be ischemia.  He might benefit from adjunctive nitrates.  Initially however, with his volume overload and peripheral edema we will increase his furosemide from 20 daily--40 daily.  Further up titration may be beneficial  Continues with paroxysms of atrial fibrillation rate control seems reasonable however, we do lose CRT which has resulted in about 85% effective CRT.  AV junction ablation and/or catheter ablation might be reasonable for mortality benefit.  No bleeding on Eliquis  We will repeat his echocardiogram to reassess left ventricular function now following CRT.  This may inform treatment options.  Wound is well-healed

## 2020-08-02 NOTE — Addendum Note (Signed)
Addended by: Festus Aloe on: 08/02/2020 01:44 PM   Modules accepted: Orders

## 2020-08-03 ENCOUNTER — Ambulatory Visit: Payer: Medicaid Other | Admitting: Family

## 2020-08-04 ENCOUNTER — Ambulatory Visit (INDEPENDENT_AMBULATORY_CARE_PROVIDER_SITE_OTHER): Payer: Medicaid Other

## 2020-08-04 DIAGNOSIS — I255 Ischemic cardiomyopathy: Secondary | ICD-10-CM

## 2020-08-04 LAB — CUP PACEART REMOTE DEVICE CHECK
Battery Remaining Longevity: 98 mo
Battery Voltage: 2.99 V
Brady Statistic AP VP Percent: 76.25 %
Brady Statistic AP VS Percent: 1.3 %
Brady Statistic AS VP Percent: 21.9 %
Brady Statistic AS VS Percent: 0.54 %
Brady Statistic RA Percent Paced: 75.73 %
Brady Statistic RV Percent Paced: 76.22 %
Date Time Interrogation Session: 20220128012403
HighPow Impedance: 89 Ohm
Implantable Lead Implant Date: 20171201
Implantable Lead Implant Date: 20171201
Implantable Lead Implant Date: 20211029
Implantable Lead Location: 753858
Implantable Lead Location: 753859
Implantable Lead Location: 753860
Implantable Lead Model: 5076
Implantable Pulse Generator Implant Date: 20211029
Lead Channel Impedance Value: 246.635
Lead Channel Impedance Value: 246.635
Lead Channel Impedance Value: 256.5 Ohm
Lead Channel Impedance Value: 256.5 Ohm
Lead Channel Impedance Value: 256.5 Ohm
Lead Channel Impedance Value: 456 Ohm
Lead Channel Impedance Value: 475 Ohm
Lead Channel Impedance Value: 513 Ohm
Lead Channel Impedance Value: 513 Ohm
Lead Channel Impedance Value: 513 Ohm
Lead Channel Impedance Value: 722 Ohm
Lead Channel Impedance Value: 760 Ohm
Lead Channel Impedance Value: 779 Ohm
Lead Channel Impedance Value: 817 Ohm
Lead Channel Impedance Value: 817 Ohm
Lead Channel Impedance Value: 836 Ohm
Lead Channel Impedance Value: 874 Ohm
Lead Channel Impedance Value: 893 Ohm
Lead Channel Pacing Threshold Amplitude: 0.625 V
Lead Channel Pacing Threshold Amplitude: 0.875 V
Lead Channel Pacing Threshold Amplitude: 2.5 V
Lead Channel Pacing Threshold Pulse Width: 0.4 ms
Lead Channel Pacing Threshold Pulse Width: 0.4 ms
Lead Channel Pacing Threshold Pulse Width: 0.4 ms
Lead Channel Sensing Intrinsic Amplitude: 0.75 mV
Lead Channel Sensing Intrinsic Amplitude: 0.75 mV
Lead Channel Sensing Intrinsic Amplitude: 17.375 mV
Lead Channel Sensing Intrinsic Amplitude: 17.375 mV
Lead Channel Setting Pacing Amplitude: 1.5 V
Lead Channel Setting Pacing Amplitude: 1.5 V
Lead Channel Setting Pacing Amplitude: 2.5 V
Lead Channel Setting Pacing Pulse Width: 0.4 ms
Lead Channel Setting Pacing Pulse Width: 0.8 ms
Lead Channel Setting Sensing Sensitivity: 0.3 mV

## 2020-08-08 ENCOUNTER — Ambulatory Visit: Payer: Medicaid Other | Admitting: Family

## 2020-08-12 NOTE — Progress Notes (Signed)
Remote ICD transmission.   

## 2020-08-16 ENCOUNTER — Ambulatory Visit (INDEPENDENT_AMBULATORY_CARE_PROVIDER_SITE_OTHER): Payer: Medicaid Other

## 2020-08-16 ENCOUNTER — Other Ambulatory Visit: Payer: Self-pay

## 2020-08-16 DIAGNOSIS — I255 Ischemic cardiomyopathy: Secondary | ICD-10-CM

## 2020-08-16 DIAGNOSIS — I5022 Chronic systolic (congestive) heart failure: Secondary | ICD-10-CM

## 2020-08-16 LAB — ECHOCARDIOGRAM COMPLETE
AR max vel: 3.14 cm2
AV Area VTI: 2.74 cm2
AV Area mean vel: 2.61 cm2
AV Mean grad: 3 mmHg
AV Peak grad: 4.5 mmHg
Ao pk vel: 1.06 m/s
Area-P 1/2: 2.54 cm2
Calc EF: 40.5 %
MV M vel: 4.78 m/s
MV Peak grad: 91.4 mmHg
MV VTI: 1.64 cm2
S' Lateral: 5.4 cm
Single Plane A2C EF: 39.5 %
Single Plane A4C EF: 43.5 %

## 2020-08-16 MED ORDER — PERFLUTREN LIPID MICROSPHERE
1.0000 mL | INTRAVENOUS | Status: AC | PRN
  Administered 2020-08-16: 3 mL via INTRAVENOUS

## 2020-11-03 ENCOUNTER — Ambulatory Visit (INDEPENDENT_AMBULATORY_CARE_PROVIDER_SITE_OTHER): Payer: Medicaid Other

## 2020-11-03 DIAGNOSIS — I5022 Chronic systolic (congestive) heart failure: Secondary | ICD-10-CM

## 2020-11-03 DIAGNOSIS — I255 Ischemic cardiomyopathy: Secondary | ICD-10-CM

## 2020-11-03 LAB — CUP PACEART REMOTE DEVICE CHECK
Battery Remaining Longevity: 92 mo
Battery Voltage: 3.01 V
Brady Statistic AP VP Percent: 78.51 %
Brady Statistic AP VS Percent: 1.4 %
Brady Statistic AS VP Percent: 19.57 %
Brady Statistic AS VS Percent: 0.52 %
Brady Statistic RA Percent Paced: 77.24 %
Brady Statistic RV Percent Paced: 56.24 %
Date Time Interrogation Session: 20220429012203
HighPow Impedance: 84 Ohm
Implantable Lead Implant Date: 20171201
Implantable Lead Implant Date: 20171201
Implantable Lead Implant Date: 20211029
Implantable Lead Location: 753858
Implantable Lead Location: 753859
Implantable Lead Location: 753860
Implantable Lead Model: 5076
Implantable Pulse Generator Implant Date: 20211029
Lead Channel Impedance Value: 237.5 Ohm
Lead Channel Impedance Value: 246.635
Lead Channel Impedance Value: 246.635
Lead Channel Impedance Value: 246.635
Lead Channel Impedance Value: 256.5 Ohm
Lead Channel Impedance Value: 418 Ohm
Lead Channel Impedance Value: 475 Ohm
Lead Channel Impedance Value: 475 Ohm
Lead Channel Impedance Value: 513 Ohm
Lead Channel Impedance Value: 513 Ohm
Lead Channel Impedance Value: 779 Ohm
Lead Channel Impedance Value: 779 Ohm
Lead Channel Impedance Value: 817 Ohm
Lead Channel Impedance Value: 817 Ohm
Lead Channel Impedance Value: 836 Ohm
Lead Channel Impedance Value: 836 Ohm
Lead Channel Impedance Value: 874 Ohm
Lead Channel Impedance Value: 931 Ohm
Lead Channel Pacing Threshold Amplitude: 0.5 V
Lead Channel Pacing Threshold Amplitude: 1 V
Lead Channel Pacing Threshold Amplitude: 2.5 V
Lead Channel Pacing Threshold Pulse Width: 0.4 ms
Lead Channel Pacing Threshold Pulse Width: 0.4 ms
Lead Channel Pacing Threshold Pulse Width: 0.4 ms
Lead Channel Sensing Intrinsic Amplitude: 1 mV
Lead Channel Sensing Intrinsic Amplitude: 1 mV
Lead Channel Sensing Intrinsic Amplitude: 17.375 mV
Lead Channel Sensing Intrinsic Amplitude: 17.375 mV
Lead Channel Setting Pacing Amplitude: 1.5 V
Lead Channel Setting Pacing Amplitude: 1.5 V
Lead Channel Setting Pacing Amplitude: 2.5 V
Lead Channel Setting Pacing Pulse Width: 0.4 ms
Lead Channel Setting Pacing Pulse Width: 0.8 ms
Lead Channel Setting Sensing Sensitivity: 0.3 mV

## 2020-11-14 ENCOUNTER — Ambulatory Visit (INDEPENDENT_AMBULATORY_CARE_PROVIDER_SITE_OTHER): Payer: Medicaid Other | Admitting: Internal Medicine

## 2020-11-14 ENCOUNTER — Encounter: Payer: Self-pay | Admitting: Internal Medicine

## 2020-11-14 ENCOUNTER — Other Ambulatory Visit: Payer: Self-pay

## 2020-11-14 VITALS — BP 104/60 | HR 68 | Ht 69.0 in | Wt 197.0 lb

## 2020-11-14 DIAGNOSIS — Z79899 Other long term (current) drug therapy: Secondary | ICD-10-CM

## 2020-11-14 DIAGNOSIS — Z95810 Presence of automatic (implantable) cardiac defibrillator: Secondary | ICD-10-CM

## 2020-11-14 DIAGNOSIS — I255 Ischemic cardiomyopathy: Secondary | ICD-10-CM

## 2020-11-14 DIAGNOSIS — I5022 Chronic systolic (congestive) heart failure: Secondary | ICD-10-CM

## 2020-11-14 DIAGNOSIS — I447 Left bundle-branch block, unspecified: Secondary | ICD-10-CM | POA: Diagnosis not present

## 2020-11-14 DIAGNOSIS — I480 Paroxysmal atrial fibrillation: Secondary | ICD-10-CM

## 2020-11-14 LAB — PACEMAKER DEVICE OBSERVATION

## 2020-11-14 NOTE — Patient Instructions (Signed)
Medication Instructions:  - Your physician recommends that you continue on your current medications as directed. Please refer to the Current Medication list given to you today.  *If you need a refill on your cardiac medications before your next appointment, please call your pharmacy*   Lab Work: - Your physician recommends that you have lab work today: BMP/ CBC  If you have labs (blood work) drawn today and your tests are completely normal, you will receive your results only by: [name] MyChart Message (if you have MyChart) OR . A paper copy in the mail If you have any lab test that is abnormal or we need to change your treatment, we will call you to review the results.   Testing/Procedures: - none ordered   Follow-Up: At [facility], you and your health needs are our priority.  As part of our continuing mission to provide you with exceptional heart care, we have created designated Provider Care Teams.  These Care Teams include your primary Cardiologist (physician) and Advanced Practice Providers (APPs -  Physician Assistants and Nurse Practitioners) who all work together to provide you with the care you need, when you need it.  We recommend signing up for the patient portal called "MyChart".  Sign up information is provided on this After Visit Summary.  MyChart is used to connect with patients for Virtual Visits (Telemedicine).  Patients are able to view lab/test results, encounter notes, upcoming appointments, etc.  Non-urgent messages can be sent to your provider as well.   To learn more about what you can do with MyChart, go to [email or web address].    Your next appointment:   9 month(s)  The format for your next appointment:   In Person  Provider:   [name], MD   Other Instructions n/a

## 2020-11-14 NOTE — Progress Notes (Signed)
Patient Care Team: Housecalls, Doctors Making as PCP - General (Geriatric Medicine) Iran Ouch, MD as PCP - Cardiology (Cardiology) Duke Salvia, MD as PCP - Electrophysiology (Cardiology)  CC CHF  HPI  Carl [name] is a 60 y.o. male seen in follow-up for  CHF and atrial fibrillation with  dual-chamber ICD, intercurrent left bundle branch block and the issue raised at [facility] of CRT  Underwent CRT upgrade 10/21  Slow healing and great care at the facility >>> well healed  The patient denies chest pain, nocturnal dyspnea, orthopnea or peripheral edema.  There have been no palpitations, lightheadedness or syncope  Shortness of breath is much less problematic  Describes recurrent falls.  This is new information to accompany healthcare provider.  They have cameras in the institutions.  Have not seen this.  Describes lightheadedness with standing.  Same feelings associated with his falls.  Often accompanied by body jerking.  No falls in the shower..     Previously on Entresto stopped because of weakness and hypotension   SCAF detected on the device.  Greater than 6 hours.  Started on Eliquis. Now recurrent episodes lasting days   No bleeding      The state is his legal guardian.  DATE TEST EF   6/17 LHC    % LAD p-80, D1-80, CX m-80 RCA m >>DESx2  4/20 Echo   20-25% %   5/21 Echo  25-30%   6/21 Myoview 23% Antero/infero apical Defect fixed   7/21 Echo  25-30% MR mild-mod  2/22 Echo  30-35% LAE severe   Date Cr K Hgb  5/21 1.4<<1.02 4.5 14.2   10/21 1.47 4.7 14.4    Thromboembolic risk factors (, HTN-1, TIA/CVA-2, Vasc disease -1, CHF-1) for a CHADSVASc Score of .>=5  Records and Results Reviewed   Past Medical History:  Diagnosis Date  . Acute pulmonary edema (HCC) 2017  . Acute respiratory failure with hypoxia (HCC) 2017  . Bipolar 1 disorder (HCC)   . CHF (congestive heart failure) (HCC)   . Chronic kidney disease   . COPD (chronic obstructive  pulmonary disease) (HCC)   . Depression   . Hypertension   . Myocardial infarction (HCC)   . NSTEMI (non-ST elevated myocardial infarction) (HCC)   . 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 Seizures (HCC)    childhood  . Stroke (HCC)   . Tobacco abuse     Past Surgical History:  Procedure Laterality Date  . BIV UPGRADE N/A 05/05/2020   Procedure: BIV ICD UPGRADE;  Surgeon: Duke Salvia, MD;  Location: [facility] INVASIVE CV LAB;  Service: Cardiovascular;  Laterality: N/A;  . CORONARY ARTERY BYPASS GRAFT      Current Meds  Medication Sig  . acetaminophen (TYLENOL) 500 MG tablet Take 500 mg by mouth every 6 (six) hours as needed for mild pain.  [name] albuterol (VENTOLIN HFA) 108 (90 Base) MCG/ACT inhaler Inhale 2 puffs into the lungs every 6 (six) hours as needed for wheezing.  [name] apixaban (ELIQUIS) 5 MG TABS tablet Take 1 tablet (5 mg total) by mouth 2 (two) times daily.  . carvedilol (COREG) 12.5 MG tablet Take 1 tablet (12.5 mg total) by mouth 2 (two) times daily.  . divalproex (DEPAKOTE ER) 500 MG 24 hr tablet Take 1 tablet (500 mg total) by mouth 2 (two) times daily.  [name] escitalopram (LEXAPRO) 10 MG tablet Take 1 tablet (10 mg total) by mouth at bedtime.  [name]  fenofibrate 160 MG tablet Take 160 mg by mouth daily.  . Fluticasone-Salmeterol (ADVAIR DISKUS) 250-50 MCG/DOSE AEPB Inhale 1 puff into the lungs 2 (two) times daily.  [name] levETIRAcetam (KEPPRA) 250 MG tablet Take 1 tablet (250 mg total) by mouth 2 (two) times daily.  [name] levothyroxine (SYNTHROID) 25 MCG tablet Take 1 tablet (25 mcg total) by mouth daily.  [name] omeprazole (PRILOSEC) 20 MG capsule Take 20 mg by mouth daily.  . QUEtiapine (SEROQUEL) 100 MG tablet Take 100 mg by mouth at bedtime.  [name] QUEtiapine (SEROQUEL) 50 MG tablet Take 50 mg by mouth in the morning.  . simvastatin (ZOCOR) 40 MG tablet Take 40 mg by mouth at bedtime.  [name] tiotropium (SPIRIVA) 18 MCG inhalation capsule Place 18 mcg into inhaler and  inhale daily.    Allergies  Allergen Reactions  . Iodinated Diagnostic Agents Shortness Of Breath  . Penicillins Anaphylaxis and Shortness Of Breath    Respiratory  Tolerated cefuroxime on 01/06/16  . Strawberry Extract Anaphylaxis  . Cefepime Itching    Empiric antibiotic, developed pruritis.   . Erythromycin Itching  . Sulfa Antibiotics Itching, Nausea And Vomiting and Nausea Only      Review of Systems negative except from HPI and PMH  Physical Exam BP 104/60   Pulse 68   Ht 5\' 9"  (1.753 m)   Wt 197 lb (89.4 kg)   BMI 29.09 kg/m  Well developed and well nourished in no acute distress HENT normal Neck supple with JVP-flat Clear Device pocket well healed; without hematom; mild discoloration at the lateral aspect of incision There is no tethering  No tenderness  Regular rate and rhythm, no  murmur Abd-soft with active BS No Clubbing cyanosis   edema Skin-warm and dry A & Oriented  Grossly normal sensory and motor function  ECG AV pacing with upright QRS lead V1 and neg Lead 1   Assessment and  Plan  Ischemic cardiomyopathy-'s s/p CABG plus mitral valve ring  Congestive heart failure-chronic-systolic-class IIb-IIIa  Implantable defibrillator-CRT-Medtronic  Left bundle branch block  Atrial fibrillation-paroxysmal   strokes-multiple  Bipolar disorder  Renal insufficiency chronic class 2 (GFR -65  2/22)  Without symptoms of ischemia  Euvolemic continue current meds--uptitration of Guideline directed medical therapy limited by symptomatic hypotension.  No intercurrent atrial fibrillation or flutter, On Anticoagulation;  No bleeding issues   Surprised that orthostatic VS were not more demonstrative, but BP runs low so will not augment meds for cardiomyopathy  Seeing psych actively--to the CMA made threats of suicide and killing the people who dont let him eat salt  This information was given to the accompanying health care provider who says these are  recurrent and well known

## 2020-11-15 LAB — CBC WITH DIFFERENTIAL/PLATELET
Basophils Absolute: 0.1 10*3/uL (ref 0.0–0.2)
Basos: 1 %
EOS (ABSOLUTE): 0.2 10*3/uL (ref 0.0–0.4)
Eos: 2 %
Hematocrit: 36.2 % — ABNORMAL LOW (ref 37.5–51.0)
Hemoglobin: 12.1 g/dL — ABNORMAL LOW (ref 13.0–17.7)
Immature Grans (Abs): 0.2 10*3/uL — ABNORMAL HIGH (ref 0.0–0.1)
Immature Granulocytes: 2 %
Lymphocytes Absolute: 1.6 10*3/uL (ref 0.7–3.1)
Lymphs: 18 %
MCH: 30.3 pg (ref 26.6–33.0)
MCHC: 33.4 g/dL (ref 31.5–35.7)
MCV: 91 fL (ref 79–97)
Monocytes Absolute: 1.1 10*3/uL — ABNORMAL HIGH (ref 0.1–0.9)
Monocytes: 12 %
Neutrophils Absolute: 6 10*3/uL (ref 1.4–7.0)
Neutrophils: 65 %
Platelets: 320 10*3/uL (ref 150–450)
RBC: 3.99 x10E6/uL — ABNORMAL LOW (ref 4.14–5.80)
RDW: 14.1 % (ref 11.6–15.4)
WBC: 9.2 10*3/uL (ref 3.4–10.8)

## 2020-11-15 LAB — BASIC METABOLIC PANEL
BUN/Creatinine Ratio: 16 (ref 10–24)
BUN: 27 mg/dL (ref 8–27)
CO2: 24 mmol/L (ref 20–29)
Calcium: 9.6 mg/dL (ref 8.6–10.2)
Chloride: 98 mmol/L (ref 96–106)
Creatinine, Ser: 1.69 mg/dL — ABNORMAL HIGH (ref 0.76–1.27)
Glucose: 92 mg/dL (ref 65–99)
Potassium: 4.6 mmol/L (ref 3.5–5.2)
Sodium: 137 mmol/L (ref 134–144)
eGFR: 46 mL/min/{1.73_m2} — ABNORMAL LOW (ref >59)

## 2020-11-21 ENCOUNTER — Telehealth: Payer: Self-pay | Admitting: Internal Medicine

## 2020-11-21 NOTE — Telephone Encounter (Signed)
Duke Salvia, MD  11/17/2020 3:07 PM EDT      Please Inform Patient  Labs are normal x *continued variability of renal function  plan to recheck by his PCP in 2 months   Thanks

## 2020-11-21 NOTE — Telephone Encounter (Signed)
I called and spoke with the patient's caregiver, Leanne Chang, regarding his lab results. I inquired if his PCP will be seeing him anytime soon. The patient is under DSS and [facility] advised [employer] should be seeing him next week.   I advised I will forward a copy of his labs to them.  Queen voices understanding and is agreeable.

## 2020-11-23 NOTE — Progress Notes (Signed)
Remote ICD transmission.   

## 2021-01-18 ENCOUNTER — Other Ambulatory Visit: Payer: Self-pay | Admitting: Internal Medicine

## 2021-01-18 ENCOUNTER — Other Ambulatory Visit: Payer: Self-pay | Admitting: Family

## 2021-01-18 NOTE — Telephone Encounter (Signed)
Pt last saw Dr Graciela Husbands 11/14/20, last labs 11/14/20 Creat 1.69, age 60, weight 89.4kg, based on specified criteria pt is on appropriate dosage of Eliquis 5mg  BID.  Will refill rx.

## 2021-02-02 ENCOUNTER — Ambulatory Visit (INDEPENDENT_AMBULATORY_CARE_PROVIDER_SITE_OTHER): Payer: Medicaid Other

## 2021-02-02 DIAGNOSIS — I255 Ischemic cardiomyopathy: Secondary | ICD-10-CM

## 2021-02-03 LAB — CUP PACEART REMOTE DEVICE CHECK
Battery Remaining Longevity: 85 mo
Battery Voltage: 3 V
Brady Statistic AP VP Percent: 62.09 %
Brady Statistic AP VS Percent: 1.1 %
Brady Statistic AS VP Percent: 32.9 %
Brady Statistic AS VS Percent: 3.9 %
Brady Statistic RA Percent Paced: 59.87 %
Brady Statistic RV Percent Paced: 70.01 %
Date Time Interrogation Session: 20220729043825
HighPow Impedance: 78 Ohm
Implantable Lead Implant Date: 20171201
Implantable Lead Implant Date: 20171201
Implantable Lead Implant Date: 20211029
Implantable Lead Location: 753858
Implantable Lead Location: 753859
Implantable Lead Location: 753860
Implantable Lead Model: 5076
Implantable Pulse Generator Implant Date: 20211029
Lead Channel Impedance Value: 218.087
Lead Channel Impedance Value: 222.34 Ohm
Lead Channel Impedance Value: 232.653
Lead Channel Impedance Value: 232.653
Lead Channel Impedance Value: 237.5 Ohm
Lead Channel Impedance Value: 418 Ohm
Lead Channel Impedance Value: 418 Ohm
Lead Channel Impedance Value: 456 Ohm
Lead Channel Impedance Value: 475 Ohm
Lead Channel Impedance Value: 475 Ohm
Lead Channel Impedance Value: 760 Ohm
Lead Channel Impedance Value: 760 Ohm
Lead Channel Impedance Value: 760 Ohm
Lead Channel Impedance Value: 779 Ohm
Lead Channel Impedance Value: 817 Ohm
Lead Channel Impedance Value: 817 Ohm
Lead Channel Impedance Value: 817 Ohm
Lead Channel Impedance Value: 950 Ohm
Lead Channel Pacing Threshold Amplitude: 0.625 V
Lead Channel Pacing Threshold Amplitude: 1.25 V
Lead Channel Pacing Threshold Amplitude: 2.5 V
Lead Channel Pacing Threshold Pulse Width: 0.4 ms
Lead Channel Pacing Threshold Pulse Width: 0.4 ms
Lead Channel Pacing Threshold Pulse Width: 0.4 ms
Lead Channel Sensing Intrinsic Amplitude: 0.625 mV
Lead Channel Sensing Intrinsic Amplitude: 0.625 mV
Lead Channel Sensing Intrinsic Amplitude: 16.5 mV
Lead Channel Sensing Intrinsic Amplitude: 16.5 mV
Lead Channel Setting Pacing Amplitude: 1.5 V
Lead Channel Setting Pacing Amplitude: 2 V
Lead Channel Setting Pacing Amplitude: 2.5 V
Lead Channel Setting Pacing Pulse Width: 0.4 ms
Lead Channel Setting Pacing Pulse Width: 0.8 ms
Lead Channel Setting Sensing Sensitivity: 0.3 mV

## 2021-02-28 NOTE — Progress Notes (Signed)
Remote ICD transmission.   

## 2021-03-01 ENCOUNTER — Other Ambulatory Visit: Payer: Self-pay

## 2021-03-01 ENCOUNTER — Other Ambulatory Visit (INDEPENDENT_AMBULATORY_CARE_PROVIDER_SITE_OTHER): Payer: Self-pay

## 2021-03-01 DIAGNOSIS — Z1211 Encounter for screening for malignant neoplasm of colon: Secondary | ICD-10-CM

## 2021-03-01 MED ORDER — PEG 3350-KCL-NA BICARB-NACL 420 G PO SOLR: 4000.0000 mL | Freq: Once | ORAL | 0 refills | Status: AC

## 2021-03-01 NOTE — Progress Notes (Signed)
Gastroenterology Pre-Procedure Review  Request Date: 04/10/21 Requesting Physician: Dr. Maximino Greenland  PATIENT REVIEW QUESTIONS: The patient responded to the following health history questions as indicated:    1. Are you having any GI issues? no 2. Do you have a personal history of Polyps? no 3. Do you have a family history of Colon Cancer or Polyps? no 4. Diabetes Mellitus? no 5. Joint replacements in the past 12 months?no 6. Major health problems in the past 3 months?no 7. Any artificial heart valves, MVP, or defibrillator?yes (AICD defibrillator)    MEDICATIONS & ALLERGIES:    Patient reports the following regarding taking any anticoagulation/antiplatelet therapy:   Plavix, Coumadin, Eliquis, Xarelto, Lovenox, Pradaxa, Brilinta, or Effient? yes (Eliquis 5 mg) Aspirin? no  Patient confirms/reports the following medications:  Current Outpatient Medications  Medication Sig Dispense Refill   acetaminophen (TYLENOL) 500 MG tablet Take 500 mg by mouth every 6 (six) hours as needed for mild pain.     albuterol (VENTOLIN HFA) 108 (90 Base) MCG/ACT inhaler Inhale 2 puffs into the lungs every 6 (six) hours as needed for wheezing. 8 g 1   apixaban (ELIQUIS) 5 MG TABS tablet TAKE 1 TABLET BY MOUTH TWICE A DAY 60 tablet 6   carvedilol (COREG) 12.5 MG tablet TAKE 1 TABLET (12.5MG ) BY MOUTH TWICE A DAY 60 tablet 11   divalproex (DEPAKOTE ER) 500 MG 24 hr tablet Take 1 tablet (500 mg total) by mouth 2 (two) times daily. 60 tablet 1   escitalopram (LEXAPRO) 10 MG tablet Take 1 tablet (10 mg total) by mouth at bedtime. 30 tablet 1   fenofibrate 160 MG tablet Take 160 mg by mouth daily.     Fluticasone-Salmeterol (ADVAIR DISKUS) 250-50 MCG/DOSE AEPB Inhale 1 puff into the lungs 2 (two) times daily. 60 each 0   furosemide (LASIX) 40 MG tablet Take 1 tablet (40 mg total) by mouth daily. 90 tablet 3   levETIRAcetam (KEPPRA) 250 MG tablet Take 1 tablet (250 mg total) by mouth 2 (two) times daily. 60 tablet 0    levothyroxine (SYNTHROID) 25 MCG tablet Take 1 tablet (25 mcg total) by mouth daily. 30 tablet 1   lisinopril (ZESTRIL) 2.5 MG tablet TAKE 1 TABLET BY MOUTH ONCE DAILY 30 tablet 11   omeprazole (PRILOSEC) 20 MG capsule Take 20 mg by mouth daily.     QUEtiapine (SEROQUEL) 100 MG tablet Take 100 mg by mouth at bedtime.     QUEtiapine (SEROQUEL) 50 MG tablet Take 50 mg by mouth in the morning.     simvastatin (ZOCOR) 40 MG tablet Take 40 mg by mouth at bedtime.     tiotropium (SPIRIVA) 18 MCG inhalation capsule Place 18 mcg into inhaler and inhale daily.     No current facility-administered medications for this visit.    Patient confirms/reports the following allergies:  Allergies  Allergen Reactions   Iodinated Diagnostic Agents Shortness Of Breath   Penicillins Anaphylaxis and Shortness Of Breath    Respiratory  Tolerated cefuroxime on 01/06/16   Strawberry Extract Anaphylaxis   Cefepime Itching    Empiric antibiotic, developed pruritis.    Erythromycin Itching   Sulfa Antibiotics Itching, Nausea And Vomiting and Nausea Only    No orders of the defined types were placed in this encounter.   AUTHORIZATION INFORMATION Primary Insurance: 1D#: Group #:  Secondary Insurance: 1D#: Group #:  SCHEDULE INFORMATION: Date: 04/10/21 Time: Location: [facility]

## 2021-03-01 NOTE — Progress Notes (Signed)
Blood/Cardiac clearance sent to Dr. Graciela Husbands.

## 2021-03-03 IMAGING — DX DG CHEST 1V PORT
1 series · 1 of 1 positions shown · non-contrast
Comparison: 11/18/2019

CLINICAL DATA: Shortness of breath.

EXAM:
PORTABLE CHEST 1 VIEW

[chest ap]
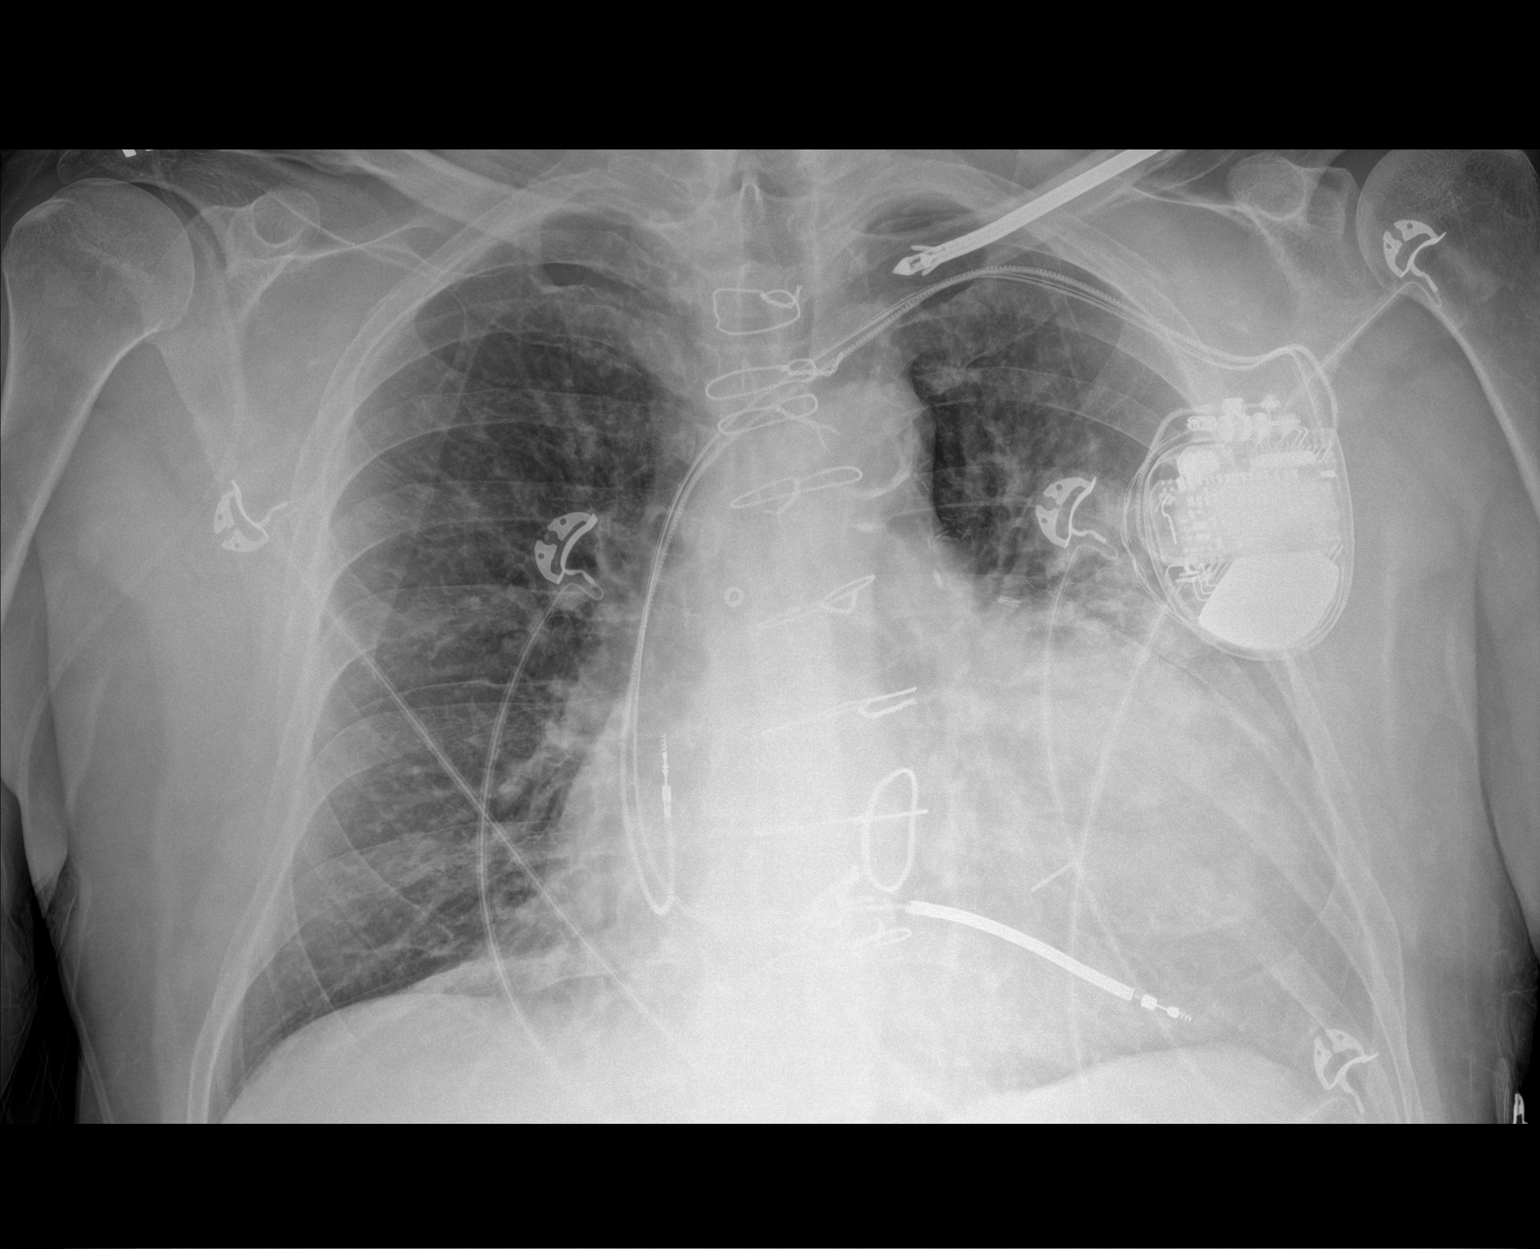

[1 of 1 positions shown; findings below may reference images not displayed]

FINDINGS: The pacer wires are stable.

Stable surgical changes from bypass surgery and valve replacement
surgery.

Stable cardiac enlargement and age advanced atherosclerotic
calcification involving the thoracic aorta.

Mild vascular congestion but no overt pulmonary edema or pleural
effusions. New airspace opacity in the left mid lung suspicious for
pneumonia.
IMPRESSION: New left mid lung airspace opacity suspicious for pneumonia.

## 2021-03-14 NOTE — Progress Notes (Signed)
Cardiology Office Note:    Date:  03/15/2021   ID:  Carl [name], DOB May 06, 1961, MRN [id number]  PCP:  [facility]  [facility] Cardiologist:  Kathlyn Sacramento, MD  [facility] Electrophysiologist:  Virl Axe, MD   Referring MD: Orvis Brill, Doctors [facility]*   Chief Complaint:pre-op cardiac eval  History of Present Illness:    Carl [name] is a 60 y.o. male with a hx of CAD s/p CABG in 2017 with MV repair and PFO closure, ischemic heart disease, CVA, Metronic ICD Dual chamber, PAF on Eliquis, COPD, HTN, LBBB, CKD stage 2-3, multiple strokes who presents for follow-up.   Previously followed with [facility] cardiology.  Patient was hospitalized at [facility] 11/18/2019 for acute respiratory distress in the setting of COPD and heart failure exacerbation.  Treated with steroids antibiotics, bronchodilators, IV diuretics.  Echo during admission showed EF 25 to 30%.  Patient started following with Dr. Velva Harman, [facility] heart failure clinic, Dr. Caryl Comes.  Seen by Dr. Velva Harman 12/02/2019 for hospital follow-up and recommended Lexiscan Myoview.  He was referred to Dr. Caryl Comes for consideration of upgrade to CRT device.  CT scan Myoview 12/08/2019 showed evidence of prior infarct with no significant ischemia, large defect of severe severity present and mid anterior, mid inferior, mid inferolateral, apical anterior, apical inferior and apex location with no significant reversibility and EF of 23%.  Patient was admitted 01/25/2020 for sepsis secondary to pneumonia.  Also treated with IV Lasix for CHF exacerbation.  Repeat echo 01/24/2020 showed EF 25 to 41%, grade 1 diastolic dysfunction, mild to moderate MR, unchanged compared to previous.  In the past Entresto stopped due to weakness and hypotension.  Recently patient has been seen by Dr. Caryl Comes.  He underwent BiV ICD upgrade on 32/44/0102, postop complicated by infection.  Most recent echo 08/2020 showed improved EF to 30-35%, global HK, moderately dilated LA, G2DD,  mild to moderate MR gradient 40mHg, dilated IVC  Today, the pateint is here for cardiac evalution prior to getting all his teeth pulled. He will be put under. He is accompanied by a caregiver. Has been waiting a year for this. Last device check showed Afib burden 19.5%. Patient reports chest pain with movement. Breathing is normal. No LLE, orthopnea, pnd. He can walk up and down the hall. Dont' have stairs. He lives in a facility. He can perform all ADLs.   Past Medical History:  Diagnosis Date   [place]ute pulmonary edema ([place]   Acute respiratory failure with hypoxia ([place] 2017   Bipolar 1 disorder (HCC)    CHF (congestive heart failure) (HCC)    Chronic kidney disease    COPD (chronic obstructive pulmonary disease) (HCC)    Depression    Hypertension    Myocardial infarction ([facility]    NSTEMI (non[place]ST elevated myocardial infarction) ([place]    RVAD (right ventricular assist device) present ([place] 01/06/2016   Seizures ([place]    childhood   Stroke ([place]    Tobacco abuse     Past Surgical History:  Procedure Laterality Date   BIV UPGRADE N/A 05/05/2020   Procedure: BIV ICD UPGRADE;  Surgeon: KDeboraha Sprang MD;  [place];  Service: Cardiovascular;  Laterality: N/A;   CORONARY ARTERY BYPASS GRAFT      Current Medications: Current Meds  Medication Sig   acetaminophen (TYLENOL) 500 MG tablet Take 500 mg by mouth every 6 (six) hours as needed for mild pain.   albuterol (VENTOLIN HFA) 108 (90 Base) MCG/ACT inhaler Inhale  2 puffs into the lungs every 6 (six) hours as needed for wheezing.   apixaban (ELIQUIS) 5 MG TABS tablet TAKE 1 TABLET BY MOUTH TWICE A DAY   carvedilol (COREG) 12.5 MG tablet TAKE 1 TABLET (12.5MG) BY MOUTH TWICE A DAY   divalproex (DEPAKOTE ER) 500 MG 24 hr tablet Take 1 tablet (500 mg total) by mouth 2 (two) times daily.   escitalopram (LEXAPRO) 10 MG tablet Take 1 tablet (10 mg total) by mouth at bedtime.   fenofibrate 160 MG tablet Take 160 mg by  mouth daily.   Fluticasone-Salmeterol (ADVAIR DISKUS) 250-50 MCG/DOSE AEPB Inhale 1 puff into the lungs 2 (two) times daily.   furosemide (LASIX) 40 MG tablet Take 1 tablet (40 mg total) by mouth daily.   levETIRAcetam (KEPPRA) 250 MG tablet Take 1 tablet (250 mg total) by mouth 2 (two) times daily.   levothyroxine (SYNTHROID) 25 MCG tablet Take 1 tablet (25 mcg total) by mouth daily.   lisinopril (ZESTRIL) 2.5 MG tablet TAKE 1 TABLET BY MOUTH ONCE DAILY   omeprazole (PRILOSEC) 20 MG capsule Take 20 mg by mouth daily.   QUEtiapine (SEROQUEL) 100 MG tablet Take 100 mg by mouth at bedtime.   QUEtiapine (SEROQUEL) 50 MG tablet Take 50 mg by mouth in the morning.   simvastatin (ZOCOR) 80 MG tablet Take 80 mg by mouth daily.   tiotropium (SPIRIVA) 18 MCG inhalation capsule Place 18 mcg into inhaler and inhale daily.     Allergies:   Iodinated diagnostic agents, Penicillins, Strawberry extract, Cefepime, Erythromycin, and Sulfa antibiotics   Social History   Socioeconomic History   Marital status: Widowed    Spouse name: Not on file   Number of children: Not on file   Years of education: Not on file   Highest education level: Not on file  Occupational History   Not on file  Tobacco Use   Smoking status: Every Day    Packs/day: 0.25    Years: 0.00    Pack years: 0.00    Types: Cigarettes   Smokeless tobacco: Never  Vaping Use   Vaping Use: Never used  Substance and Sexual Activity   Alcohol use: Not Currently   Drug use: Never   Sexual activity: Not Currently    Birth control/protection: Abstinence  Other Topics Concern   Not on file  Social History Narrative   Not on file   Social Determinants of Health   Financial Resource Strain: Not on file  Food Insecurity: Not on file  Transportation Needs: Not on file  Physical Activity: Not on file  Stress: Not on file  Social Connections: Not on file     Family History: The patient's family history includes Cancer in his  mother; Prostate cancer in his father.  ROS:   Please see the history of present illness.     All other systems reviewed and are negative.  EKGs/Labs/Other Studies Reviewed:    The following studies were reviewed today:    Echo 01/24/20 1. Left ventricular ejection fraction, by estimation, is 25 to 30%. The  left ventricle has severely decreased function. The left ventricle  demonstrates global hypokinesis. Left ventricular diastolic parameters are  consistent with Grade I diastolic  dysfunction (impaired relaxation).   2. Right ventricular systolic function is mildly reduced. The right  ventricular size is moderately enlarged. There is mildly elevated  pulmonary artery systolic pressure.   3. Left atrial size was mild to moderately dilated.   4. Right atrial  size was moderately dilated.   5. The mitral valve is degenerative. Mild to moderate mitral valve  regurgitation. No evidence of mitral stenosis.   6. The aortic valve is normal in structure. Aortic valve regurgitation is  trivial. No aortic stenosis is present.   7. The inferior vena cava is normal in size with greater than 50%  respiratory variability, suggesting right atrial pressure of 3 mmHg.    VAS [place] LE ABI 01/07/20 Right: Resting right ankle-brachial index is within normal range. No  evidence of significant right lower extremity arterial disease. The right  toe-brachial index is normal.   Left: Resting left ankle-brachial index is within normal range. No  evidence of significant left lower extremity arterial disease. The left  toe-brachial index is abnormal.    Echo 11/18/19  1. Left ventricular ejection fraction, by estimation, is 25 to 30%. The  left ventricle has moderate to severely decreased function. The left  ventricle demonstrates global hypokinesis. The left ventricular internal  cavity size was moderately dilated.  Left ventricular diastolic parameters were normal.   2. Right ventricular systolic  function is normal. The right ventricular  size is mildly enlarged.   3. Left atrial size was mildly dilated.   4. Right atrial size was mildly dilated.   5. The mitral valve is normal in structure. No evidence of mitral valve  regurgitation.   6. The aortic valve is grossly normal. Aortic valve regurgitation is not  visualized.   Myoview Lexiscan 12/2019 Narrative & Impression  There was no ST segment deviation noted during stress. No T wave inversion was noted during stress. Defect 1: There is a large defect of severe severity present in the mid anterior, mid inferior, mid inferolateral, apical anterior, apical inferior and apex location. Findings consistent with prior myocardial infarction in the mid to distal LAD and RCA/left circumflex. No significant reversibility. This is a high risk study. Nuclear stress EF: 23%.   Echo 2/20221. Left ventricular ejection fraction, by estimation, is 30 to 35%. The  left ventricle has moderate to severely decreased function. The left  ventricle demonstrates global hypokinesis. The left ventricular internal  cavity size was moderately dilated.  Left ventricular diastolic parameters are consistent with Grade II  diastolic dysfunction (pseudonormalization).   2. Right ventricular systolic function is normal. The right ventricular  size is normal.   3. Left atrial size was severely dilated.   4. The mitral valve is degenerative. Mild to moderate mitral valve  regurgitation. Mild mitral stenosis. The mean mitral valve gradient is 4.0  mmHg.   5. The aortic valve is tricuspid. Aortic valve regurgitation is not  visualized.   6. The inferior vena cava is dilated in size with >50% respiratory  variability, suggesting right atrial pressure of 8 mmHg.   Comparison(s): EF 25-30%, global hypokinesis, mild-moderate dilated LA and  RA, moderate MAC posterior leaflet.   EKG:  EKG is  ordered today.  The ekg ordered today demonstrates AV paced rhythm,  65bpm, PVC, no significant change  Recent Labs: 11/14/2020: BUN 27; Creatinine, Ser 1.69; Hemoglobin 12.1; Platelets 320; Potassium 4.6; Sodium 137  Recent Lipid Panel    Component Value Date/Time   CHOL 192 05/15/2019 0651   TRIG 281 (H) 05/15/2019 0651   HDL 46 05/15/2019 0651   CHOLHDL 4.2 05/15/2019 0651   VLDL 56 (H) 05/15/2019 0651   LDLCALC 90 05/15/2019 0651    Physical Exam:    VS:  BP 100/60 (BP Location: Left Arm, Patient  Position: Sitting, Cuff Size: Large)   Pulse 75   Ht _0  (1.803 m)   Wt 196 lb (88.9 kg)   SpO2 97%   BMI 27.34 kg/m     Wt Readings from Last 3 Encounters:  03/15/21 196 lb (88.9 kg)  11/14/20 197 lb (89.4 kg)  08/01/20 201 lb 4 oz (91.3 kg)     GEN:  Well nourished, well developed in no acute distress HEENT: Normal NECK: No JVD; No carotid bruits LYMPHATICS: No lymphadenopathy CARDIAC: RRR, no murmurs, rubs, gallops RESPIRATORY:  Clear to auscultation without rales, wheezing or rhonchi  ABDOMEN: Soft, non-tender, non-distended MUSCULOSKELETAL:  No edema; No deformity  SKIN: Warm and dry NEUROLOGIC:  Alert and oriented x 3 PSYCHIATRIC:  Normal affect   ASSESSMENT:    1. Preoperative cardiovascular examination   2. Coronary artery disease involving native coronary artery of native heart with other form of angina pectoris ([place])   3. Essential hypertension   4. Chronic systolic heart failure (HCC)    PLAN:    In order of problems listed above:  Preoperative cardiac evaluation Patient to undergo teeth extraction, he will put under since they will be taking out all teeth. Wanting to hold Eliquis. Patient has history of PAF with last device reading with 19.5% Afib. He denies and chest pain or shortness of breath. He is euvolemic on exam. EKG with no change. Prior [facility] 12/2019 showed prior infarct and no acute ischemia. Known h/o HFrEF with last echo 08/2020 showing LVEF up 20-25%>>30-35%. METS>4. According to the Revised  Cardiac Risk assessment patient is class IV risk, 15% 30 day risk of death, MI, or cardiac arrest.   Spoke with Dr. Caryl Comes regarding holding Eliquis. We do not recommend stopping Eliquis given history of Afib and multiple strokes. If surgery is absolutely necessary can stop Eliquis 36 hours before and restart as soon as possible afterwards. Defer this risk to surgeon.    Disposition: Follow up in 6 month with MD/APP    Signed, Mackinley Kiehn Ninfa Meeker, PA-C  03/15/2021 9:02 PM    [facility]

## 2021-03-15 ENCOUNTER — Ambulatory Visit (INDEPENDENT_AMBULATORY_CARE_PROVIDER_SITE_OTHER): Payer: Medicaid Other | Admitting: Medical

## 2021-03-15 ENCOUNTER — Other Ambulatory Visit: Payer: Self-pay

## 2021-03-15 ENCOUNTER — Encounter: Payer: Self-pay | Admitting: Medical

## 2021-03-15 VITALS — BP 100/60 | HR 75 | Ht 71.0 in | Wt 196.0 lb

## 2021-03-15 DIAGNOSIS — I5022 Chronic systolic (congestive) heart failure: Secondary | ICD-10-CM

## 2021-03-15 DIAGNOSIS — I10 Essential (primary) hypertension: Secondary | ICD-10-CM

## 2021-03-15 DIAGNOSIS — I25118 Atherosclerotic heart disease of native coronary artery with other forms of angina pectoris: Secondary | ICD-10-CM

## 2021-03-15 DIAGNOSIS — Z01810 Encounter for preprocedural cardiovascular examination: Secondary | ICD-10-CM

## 2021-03-15 NOTE — Patient Instructions (Signed)
Medication Instructions:   Your physician recommends that you continue on your current medications as directed. Please refer to the Current Medication list given to you today.  *If you need a refill on your cardiac medications before your next appointment, please call your pharmacy*   Lab Work:  None ordered  Testing/Procedures:  None ordered   Follow-Up: At [facility], you and your health needs are our priority.  As part of our continuing mission to provide you with exceptional heart care, we have created designated Provider Care Teams.  These Care Teams include your primary Cardiologist (physician) and Advanced Practice Providers (APPs -  Physician Assistants and Nurse Practitioners) who all work together to provide you with the care you need, when you need it.  We recommend signing up for the patient portal called "MyChart".  Sign up information is provided on this After Visit Summary.  MyChart is used to connect with patients for Virtual Visits (Telemedicine).  Patients are able to view lab/test results, encounter notes, upcoming appointments, etc.  Non-urgent messages can be sent to your provider as well.   To learn more about what you can do with MyChart, go to [email or web address].    Your next appointment:   6 month(s)  The format for your next appointment:   In Person  Provider:   You may see Muhammad Arida, MD or one of the following Advanced Practice Providers on your designated Care Team:   Christopher Berge, NP Ryan Dunn, PA-C Jacquelyn Visser, PA-C Cadence Furth, PA-C   

## 2021-03-30 ENCOUNTER — Telehealth: Payer: Self-pay | Admitting: *Deleted

## 2021-03-30 NOTE — Telephone Encounter (Signed)
Patient with diagnosis of afib on Eliquis for anticoagulation.    Procedure: colonoscopy Date of procedure: 04/10/21  CHA2DS2-VASc Score = 5  This indicates a 7.2% annual risk of stroke. The patient's score is based upon: CHF History: 1 HTN History: 1 Diabetes History: 0 Stroke History: 2 Vascular Disease History: 1 Age Score: 0 Gender Score: 0   CrCl 25mL/min Platelet count 320K  Per office protocol, patient can hold Eliquis for 1 day prior to procedure. Resume as soon as safely possible given elevated CV risk (hx of multiple strokes).

## 2021-03-30 NOTE — Telephone Encounter (Signed)
    Patient Name: Emerald Gehres  DOB: [birth year] MRN: [id number]  Primary Cardiologist: Lorine Bears, MD  Chart reviewed as part of pre-operative protocol coverage. Given past medical history and time since last visit, based on ACC/AHA guidelines, Yedidya Pfalzgraf would be at acceptable risk for the planned procedure without further cardiovascular testing.   Patient may hold Eliquis for 1 day prior to the procedure and restart as soon as possible afterward at the GI doctor's discretion.   I will route this recommendation to the requesting party via Epic fax function and remove from pre-op pool.  Please call with questions.  [place], [place] 03/30/2021, 10:37 PM

## 2021-03-30 NOTE — Telephone Encounter (Signed)
   [facility] Pre-operative Risk Assessment    Patient Name: Carl [name]  DOB: Jul 21, 1960 MRN: [id number]  HEARTCARE STAFF:  - IMPORTANT!!!!!! Under Visit Info/Reason for Call, type in Other and utilize the format Clearance MM/DD/YY or Clearance TBD. Do not use dashes or single digits. - Please review there is not already an duplicate clearance open for this procedure. - If request is for dental extraction, please clarify the # of teeth to be extracted. - If the patient is currently at the dentist's office, call Pre-Op Callback Staff (MA/nurse) to input urgent request.  - If the patient is not currently in the dentist office, please route to the Pre-Op pool.  Request for surgical clearance:  What type of surgery is being performed? COLONOSCOPY  When is this surgery scheduled? 04/10/21  What type of clearance is required (medical clearance vs. Pharmacy clearance to hold med vs. Both)? BOTH  Are there any medications that need to be held prior to surgery and how long? [place] name and name of physician performing surgery? [facility]; DR. Vonda Antigua  What is the office phone number? [phone]   7.   What is the office fax number? [phone]  8.   Anesthesia type (None, local, MAC, general) ? NOT LISTED (PROPOFOL?)   Julaine Hua 03/30/2021, 2:07 PM  _________________________________________________________________   (provider comments below)

## 2021-03-30 NOTE — Telephone Encounter (Signed)
Clinical pharmacist to review Eliquis.  See recent note, patient was seen for preoperative clearance in early September prior to multiple teeth extractions.  She was instructed to hold Eliquis for 36 hours prior to and restart as soon as possible after the procedure.  Does the same apply to colonoscopy?

## 2021-04-02 NOTE — Telephone Encounter (Signed)
I will fax notes to requesting office giving clearance as well as medication instructions. Will remove from the pre op call back pool.

## 2021-04-04 ENCOUNTER — Telehealth: Payer: Self-pay

## 2021-04-04 NOTE — Telephone Encounter (Signed)
Left message on voicemail    Primary Cardiologist: Lorine Bears, MD   Chart reviewed as part of pre-operative protocol coverage. Given past medical history and time since last visit, based on ACC/AHA guidelines, Carl [name] would be at acceptable risk for the planned procedure without further cardiovascular testing.    Patient may hold Eliquis for 1 day prior to the procedure and restart as soon as possible afterward at the GI doctor's discretion.

## 2021-04-09 ENCOUNTER — Encounter: Payer: Self-pay | Admitting: Gastroenterology

## 2021-04-10 ENCOUNTER — Ambulatory Visit: Admission: RE | Admit: 2021-04-10 | Payer: Medicaid Other | Source: Home / Self Care | Admitting: Gastroenterology

## 2021-04-10 ENCOUNTER — Encounter: Payer: Self-pay | Admitting: Registered Nurse

## 2021-04-10 ENCOUNTER — Encounter: Admission: RE | Payer: Self-pay | Source: Home / Self Care

## 2021-04-10 SURGERY — COLONOSCOPY WITH PROPOFOL
Anesthesia: General

## 2021-04-10 MED ORDER — PROPOFOL 500 MG/50ML IV EMUL
INTRAVENOUS | Status: AC
  Filled 2021-04-10: qty 200

## 2021-04-10 MED ORDER — PHENYLEPHRINE HCL (PRESSORS) 10 MG/ML IV SOLN
INTRAVENOUS | Status: AC
  Filled 2021-04-10: qty 1

## 2021-04-10 MED ORDER — LIDOCAINE HCL (PF) 2 % IJ SOLN
INTRAMUSCULAR | Status: AC
  Filled 2021-04-10: qty 5

## 2021-04-10 MED ORDER — LIDOCAINE HCL (PF) 2 % IJ SOLN
INTRAMUSCULAR | Status: AC
  Filled 2021-04-10: qty 20

## 2021-04-10 MED ORDER — PROPOFOL 10 MG/ML IV BOLUS
INTRAVENOUS | Status: AC
  Filled 2021-04-10: qty 20

## 2021-05-04 ENCOUNTER — Ambulatory Visit (INDEPENDENT_AMBULATORY_CARE_PROVIDER_SITE_OTHER): Payer: Medicaid Other

## 2021-05-04 DIAGNOSIS — I255 Ischemic cardiomyopathy: Secondary | ICD-10-CM | POA: Diagnosis not present

## 2021-05-04 LAB — CUP PACEART REMOTE DEVICE CHECK
Battery Remaining Longevity: 81 mo
Battery Voltage: 2.99 V
Brady Statistic AP VP Percent: 80.3 %
Brady Statistic AP VS Percent: 1.54 %
Brady Statistic AS VP Percent: 17.64 %
Brady Statistic AS VS Percent: 0.52 %
Brady Statistic RA Percent Paced: 78.83 %
Brady Statistic RV Percent Paced: 66.83 %
Date Time Interrogation Session: 20221028033524
HighPow Impedance: 85 Ohm
Implantable Lead Implant Date: 20171201
Implantable Lead Implant Date: 20171201
Implantable Lead Implant Date: 20211029
Implantable Lead Location: 753858
Implantable Lead Location: 753859
Implantable Lead Location: 753860
Implantable Lead Model: 5076
Implantable Pulse Generator Implant Date: 20211029
Lead Channel Impedance Value: 212.8 Ohm
Lead Channel Impedance Value: 212.8 Ohm
Lead Channel Impedance Value: 228 Ohm
Lead Channel Impedance Value: 241.412
Lead Channel Impedance Value: 241.412
Lead Channel Impedance Value: 399 Ohm
Lead Channel Impedance Value: 399 Ohm
Lead Channel Impedance Value: 456 Ohm
Lead Channel Impedance Value: 456 Ohm
Lead Channel Impedance Value: 513 Ohm
Lead Channel Impedance Value: 703 Ohm
Lead Channel Impedance Value: 703 Ohm
Lead Channel Impedance Value: 703 Ohm
Lead Channel Impedance Value: 760 Ohm
Lead Channel Impedance Value: 760 Ohm
Lead Channel Impedance Value: 779 Ohm
Lead Channel Impedance Value: 836 Ohm
Lead Channel Impedance Value: 836 Ohm
Lead Channel Pacing Threshold Amplitude: 0.5 V
Lead Channel Pacing Threshold Amplitude: 1.125 V
Lead Channel Pacing Threshold Amplitude: 2.5 V
Lead Channel Pacing Threshold Pulse Width: 0.4 ms
Lead Channel Pacing Threshold Pulse Width: 0.4 ms
Lead Channel Pacing Threshold Pulse Width: 0.4 ms
Lead Channel Sensing Intrinsic Amplitude: 1.25 mV
Lead Channel Sensing Intrinsic Amplitude: 1.25 mV
Lead Channel Sensing Intrinsic Amplitude: 18.75 mV
Lead Channel Sensing Intrinsic Amplitude: 18.75 mV
Lead Channel Setting Pacing Amplitude: 1.5 V
Lead Channel Setting Pacing Amplitude: 1.75 V
Lead Channel Setting Pacing Amplitude: 2.5 V
Lead Channel Setting Pacing Pulse Width: 0.4 ms
Lead Channel Setting Pacing Pulse Width: 0.8 ms
Lead Channel Setting Sensing Sensitivity: 0.3 mV

## 2021-05-14 NOTE — Progress Notes (Signed)
Remote ICD transmission.   

## 2021-05-22 ENCOUNTER — Other Ambulatory Visit: Payer: Self-pay

## 2021-05-22 ENCOUNTER — Emergency Department: Payer: Medicaid Other

## 2021-05-22 DIAGNOSIS — I480 Paroxysmal atrial fibrillation: Secondary | ICD-10-CM | POA: Diagnosis not present

## 2021-05-22 DIAGNOSIS — Y92129 Unspecified place in nursing home as the place of occurrence of the external cause: Secondary | ICD-10-CM | POA: Diagnosis not present

## 2021-05-22 DIAGNOSIS — Z79899 Other long term (current) drug therapy: Secondary | ICD-10-CM | POA: Insufficient documentation

## 2021-05-22 DIAGNOSIS — F17210 Nicotine dependence, cigarettes, uncomplicated: Secondary | ICD-10-CM | POA: Diagnosis not present

## 2021-05-22 DIAGNOSIS — S0990XA Unspecified injury of head, initial encounter: Principal | ICD-10-CM | POA: Insufficient documentation

## 2021-05-22 DIAGNOSIS — Z95810 Presence of automatic (implantable) cardiac defibrillator: Secondary | ICD-10-CM | POA: Diagnosis not present

## 2021-05-22 DIAGNOSIS — I2510 Atherosclerotic heart disease of native coronary artery without angina pectoris: Secondary | ICD-10-CM | POA: Insufficient documentation

## 2021-05-22 DIAGNOSIS — W19XXXA Unspecified fall, initial encounter: Secondary | ICD-10-CM | POA: Insufficient documentation

## 2021-05-22 DIAGNOSIS — I5022 Chronic systolic (congestive) heart failure: Secondary | ICD-10-CM | POA: Diagnosis not present

## 2021-05-22 DIAGNOSIS — E039 Hypothyroidism, unspecified: Secondary | ICD-10-CM | POA: Insufficient documentation

## 2021-05-22 DIAGNOSIS — I130 Hypertensive heart and chronic kidney disease with heart failure and stage 1 through stage 4 chronic kidney disease, or unspecified chronic kidney disease: Secondary | ICD-10-CM | POA: Insufficient documentation

## 2021-05-22 DIAGNOSIS — Z20822 Contact with and (suspected) exposure to covid-19: Secondary | ICD-10-CM | POA: Insufficient documentation

## 2021-05-22 DIAGNOSIS — Z951 Presence of aortocoronary bypass graft: Secondary | ICD-10-CM | POA: Insufficient documentation

## 2021-05-22 DIAGNOSIS — J441 Chronic obstructive pulmonary disease with (acute) exacerbation: Secondary | ICD-10-CM | POA: Diagnosis not present

## 2021-05-22 DIAGNOSIS — N189 Chronic kidney disease, unspecified: Secondary | ICD-10-CM | POA: Insufficient documentation

## 2021-05-22 DIAGNOSIS — Z7901 Long term (current) use of anticoagulants: Secondary | ICD-10-CM | POA: Insufficient documentation

## 2021-05-22 LAB — BASIC METABOLIC PANEL
Anion gap: 8 (ref 5–15)
BUN: 15 mg/dL (ref 8–23)
CO2: 25 mmol/L (ref 22–32)
Calcium: 8.5 mg/dL — ABNORMAL LOW (ref 8.9–10.3)
Chloride: 102 mmol/L (ref 98–111)
Creatinine, Ser: 1.18 mg/dL (ref 0.61–1.24)
GFR, Estimated: >60 mL/min (ref >60)
Glucose, Bld: 101 mg/dL — ABNORMAL HIGH (ref 70–99)
Potassium: 3.6 mmol/L (ref 3.5–5.1)
Sodium: 135 mmol/L (ref 135–145)

## 2021-05-22 LAB — CBC
HCT: 38.8 % — ABNORMAL LOW (ref 39.0–52.0)
Hemoglobin: 13.1 g/dL (ref 13.0–17.0)
MCH: 31.2 pg (ref 26.0–34.0)
MCHC: 33.8 g/dL (ref 30.0–36.0)
MCV: 92.4 fL (ref 80.0–100.0)
Platelets: 172 10*3/uL (ref 150–400)
RBC: 4.2 MIL/uL — ABNORMAL LOW (ref 4.22–5.81)
RDW: 15.4 % (ref 11.5–15.5)
WBC: 8.5 10*3/uL (ref 4.0–10.5)
nRBC: 0 % (ref 0.0–0.2)

## 2021-05-22 NOTE — ED Triage Notes (Signed)
Pt comes into the ED via EMS from [facility], confusion at baseline on arrival, fell back with rectal pain today is the chief complaint  136/70 88 97%RA

## 2021-05-22 NOTE — ED Notes (Signed)
While obtaining VS, pt began talking about attempted suicide 6 months ago because of woman named "love" at facility that he does not like, showed RN scar on left wrist. Denies SI at this time. Also started talking about daughters in APS and how they are "motherfuckers".  Per first nurse note, pt altered at baseline.  No contact info for legal guardian in chart, pt unable to provide . MSE Loralyn Freshwater NP in triage

## 2021-05-23 ENCOUNTER — Observation Stay
Admission: EM | Admit: 2021-05-23 | Discharge: 2021-05-26 | Disposition: A | Discharge status: Home / Self Care | Payer: Medicaid Other | Attending: Internal Medicine | Admitting: Internal Medicine

## 2021-05-23 ENCOUNTER — Emergency Department: Payer: Medicaid Other

## 2021-05-23 DIAGNOSIS — J441 Chronic obstructive pulmonary disease with (acute) exacerbation: Secondary | ICD-10-CM

## 2021-05-23 DIAGNOSIS — I714 Abdominal aortic aneurysm, without rupture, unspecified: Secondary | ICD-10-CM

## 2021-05-23 DIAGNOSIS — Z8673 Personal history of transient ischemic attack (TIA), and cerebral infarction without residual deficits: Secondary | ICD-10-CM

## 2021-05-23 DIAGNOSIS — R531 Weakness: Secondary | ICD-10-CM

## 2021-05-23 DIAGNOSIS — I5022 Chronic systolic (congestive) heart failure: Secondary | ICD-10-CM | POA: Diagnosis present

## 2021-05-23 DIAGNOSIS — Z95810 Presence of automatic (implantable) cardiac defibrillator: Secondary | ICD-10-CM | POA: Diagnosis present

## 2021-05-23 DIAGNOSIS — F319 Bipolar disorder, unspecified: Secondary | ICD-10-CM | POA: Diagnosis present

## 2021-05-23 DIAGNOSIS — I255 Ischemic cardiomyopathy: Secondary | ICD-10-CM | POA: Diagnosis present

## 2021-05-23 DIAGNOSIS — Z7901 Long term (current) use of anticoagulants: Secondary | ICD-10-CM

## 2021-05-23 DIAGNOSIS — I5042 Chronic combined systolic (congestive) and diastolic (congestive) heart failure: Secondary | ICD-10-CM | POA: Diagnosis present

## 2021-05-23 DIAGNOSIS — W19XXXA Unspecified fall, initial encounter: Secondary | ICD-10-CM

## 2021-05-23 DIAGNOSIS — I480 Paroxysmal atrial fibrillation: Secondary | ICD-10-CM

## 2021-05-23 DIAGNOSIS — I959 Hypotension, unspecified: Secondary | ICD-10-CM | POA: Diagnosis present

## 2021-05-23 DIAGNOSIS — I5023 Acute on chronic systolic (congestive) heart failure: Secondary | ICD-10-CM

## 2021-05-23 DIAGNOSIS — E039 Hypothyroidism, unspecified: Secondary | ICD-10-CM | POA: Diagnosis present

## 2021-05-23 DIAGNOSIS — I2510 Atherosclerotic heart disease of native coronary artery without angina pectoris: Secondary | ICD-10-CM | POA: Diagnosis present

## 2021-05-23 DIAGNOSIS — I10 Essential (primary) hypertension: Secondary | ICD-10-CM | POA: Diagnosis present

## 2021-05-23 LAB — URINALYSIS, COMPLETE (UACMP) WITH MICROSCOPIC
Bacteria, UA: NONE SEEN
Bilirubin Urine: NEGATIVE
Glucose, UA: NEGATIVE mg/dL
Hgb urine dipstick: NEGATIVE
Ketones, ur: NEGATIVE mg/dL
Leukocytes,Ua: NEGATIVE
Nitrite: NEGATIVE
Protein, ur: 30 mg/dL — AB
Specific Gravity, Urine: 1.025 (ref 1.005–1.030)
Squamous Epithelial / HPF: NONE SEEN (ref 0–5)
pH: 5 (ref 5.0–8.0)

## 2021-05-23 LAB — URINE DRUG SCREEN, QUALITATIVE (ARMC ONLY)
Amphetamines, Ur Screen: NOT DETECTED
Barbiturates, Ur Screen: NOT DETECTED
Benzodiazepine, Ur Scrn: NOT DETECTED
Cannabinoid 50 Ng, Ur ~~LOC~~: NOT DETECTED
Cocaine Metabolite,Ur ~~LOC~~: NOT DETECTED
MDMA (Ecstasy)Ur Screen: POSITIVE — AB
Methadone Scn, Ur: NOT DETECTED
Opiate, Ur Screen: POSITIVE — AB
Phencyclidine (PCP) Ur S: NOT DETECTED
Tricyclic, Ur Screen: POSITIVE — AB

## 2021-05-23 LAB — VALPROIC ACID LEVEL: Valproic Acid Lvl: 48 ug/mL — ABNORMAL LOW (ref 50.0–100.0)

## 2021-05-23 LAB — RESP PANEL BY RT-PCR (FLU A&B, COVID) ARPGX2
Influenza A by PCR: NEGATIVE
Influenza B by PCR: NEGATIVE
SARS Coronavirus 2 by RT PCR: NEGATIVE

## 2021-05-23 LAB — HIV ANTIBODY (ROUTINE TESTING W REFLEX): HIV Screen 4th Generation wRfx: NONREACTIVE

## 2021-05-23 MED ORDER — LISINOPRIL 5 MG PO TABS
2.5000 mg | ORAL_TABLET | Freq: Every day | ORAL | Status: DC
  Administered 2021-05-24 – 2021-05-25 (×2): 2.5 mg via ORAL
  Filled 2021-05-23 (×3): qty 1
  Filled 2021-05-23: qty 0.5

## 2021-05-23 MED ORDER — MOMETASONE FURO-FORMOTEROL FUM 200-5 MCG/ACT IN AERO
2.0000 | INHALATION_SPRAY | Freq: Two times a day (BID) | RESPIRATORY_TRACT | Status: DC
  Administered 2021-05-23 – 2021-05-26 (×6): 2 via RESPIRATORY_TRACT
  Filled 2021-05-23: qty 8.8

## 2021-05-23 MED ORDER — IPRATROPIUM-ALBUTEROL 0.5-2.5 (3) MG/3ML IN SOLN
3.0000 mL | Freq: Once | RESPIRATORY_TRACT | Status: AC
  Administered 2021-05-23: 3 mL via RESPIRATORY_TRACT
  Filled 2021-05-23: qty 3

## 2021-05-23 MED ORDER — HYDROCODONE-ACETAMINOPHEN 5-325 MG PO TABS
1.0000 | ORAL_TABLET | Freq: Once | ORAL | Status: AC
  Administered 2021-05-23: 1 via ORAL
  Filled 2021-05-23: qty 1

## 2021-05-23 MED ORDER — FENOFIBRATE 160 MG PO TABS
160.0000 mg | ORAL_TABLET | Freq: Every day | ORAL | Status: DC
  Administered 2021-05-24 – 2021-05-26 (×3): 160 mg via ORAL
  Filled 2021-05-23 (×4): qty 1

## 2021-05-23 MED ORDER — PREDNISONE 20 MG PO TABS
60.0000 mg | ORAL_TABLET | Freq: Once | ORAL | Status: AC
  Administered 2021-05-23: 60 mg via ORAL
  Filled 2021-05-23: qty 3

## 2021-05-23 MED ORDER — ALBUTEROL SULFATE (2.5 MG/3ML) 0.083% IN NEBU
2.5000 mg | INHALATION_SOLUTION | RESPIRATORY_TRACT | Status: DC | PRN
  Administered 2021-05-24: 07:00:00 2.5 mg via RESPIRATORY_TRACT
  Filled 2021-05-23: qty 3

## 2021-05-23 MED ORDER — LEVETIRACETAM 250 MG PO TABS
250.0000 mg | ORAL_TABLET | Freq: Two times a day (BID) | ORAL | Status: DC
  Administered 2021-05-23 – 2021-05-26 (×6): 250 mg via ORAL
  Filled 2021-05-23 (×8): qty 1

## 2021-05-23 MED ORDER — QUETIAPINE FUMARATE 25 MG PO TABS
100.0000 mg | ORAL_TABLET | Freq: Every day | ORAL | Status: DC
  Administered 2021-05-23 – 2021-05-25 (×3): 100 mg via ORAL
  Filled 2021-05-23 (×3): qty 4

## 2021-05-23 MED ORDER — HYDROCODONE-ACETAMINOPHEN 5-325 MG PO TABS
1.0000 | ORAL_TABLET | ORAL | Status: DC | PRN
  Administered 2021-05-24 (×2): 1 via ORAL
  Filled 2021-05-23 (×2): qty 1

## 2021-05-23 MED ORDER — ONDANSETRON HCL 4 MG/2ML IJ SOLN: 4.0000 mg | Freq: Four times a day (QID) | INTRAMUSCULAR | Status: DC | PRN

## 2021-05-23 MED ORDER — ATORVASTATIN CALCIUM 20 MG PO TABS
40.0000 mg | ORAL_TABLET | Freq: Every day | ORAL | Status: DC
  Administered 2021-05-24 – 2021-05-26 (×3): 40 mg via ORAL
  Filled 2021-05-23 (×4): qty 2

## 2021-05-23 MED ORDER — IPRATROPIUM-ALBUTEROL 0.5-2.5 (3) MG/3ML IN SOLN
3.0000 mL | Freq: Four times a day (QID) | RESPIRATORY_TRACT | Status: DC
  Administered 2021-05-23 – 2021-05-26 (×10): 3 mL via RESPIRATORY_TRACT
  Filled 2021-05-23 (×11): qty 3

## 2021-05-23 MED ORDER — APIXABAN 5 MG PO TABS
5.0000 mg | ORAL_TABLET | Freq: Two times a day (BID) | ORAL | Status: DC
  Administered 2021-05-23 – 2021-05-26 (×6): 5 mg via ORAL
  Filled 2021-05-23 (×6): qty 1

## 2021-05-23 MED ORDER — ESCITALOPRAM OXALATE 10 MG PO TABS
10.0000 mg | ORAL_TABLET | Freq: Every day | ORAL | Status: DC
  Administered 2021-05-23 – 2021-05-25 (×3): 10 mg via ORAL
  Filled 2021-05-23 (×4): qty 1

## 2021-05-23 MED ORDER — FUROSEMIDE 40 MG PO TABS
40.0000 mg | ORAL_TABLET | Freq: Every day | ORAL | Status: DC
  Administered 2021-05-24 – 2021-05-26 (×3): 40 mg via ORAL
  Filled 2021-05-23 (×3): qty 1

## 2021-05-23 MED ORDER — ACETAMINOPHEN 500 MG PO TABS: 500.0000 mg | ORAL_TABLET | Freq: Four times a day (QID) | ORAL | Status: DC | PRN

## 2021-05-23 MED ORDER — DIVALPROEX SODIUM ER 500 MG PO TB24
500.0000 mg | ORAL_TABLET | Freq: Two times a day (BID) | ORAL | Status: DC
  Administered 2021-05-23 – 2021-05-26 (×5): 500 mg via ORAL
  Filled 2021-05-23: qty 2
  Filled 2021-05-23 (×3): qty 1
  Filled 2021-05-23: qty 2
  Filled 2021-05-23 (×2): qty 1

## 2021-05-23 MED ORDER — QUETIAPINE FUMARATE 25 MG PO TABS
50.0000 mg | ORAL_TABLET | Freq: Every day | ORAL | Status: DC
  Administered 2021-05-24 – 2021-05-26 (×3): 50 mg via ORAL
  Filled 2021-05-23 (×3): qty 2

## 2021-05-23 MED ORDER — ONDANSETRON HCL 4 MG PO TABS: 4.0000 mg | ORAL_TABLET | Freq: Four times a day (QID) | ORAL | Status: DC | PRN

## 2021-05-23 MED ORDER — ONDANSETRON 4 MG PO TBDP
4.0000 mg | ORAL_TABLET | Freq: Once | ORAL | Status: AC
  Administered 2021-05-23: 4 mg via ORAL
  Filled 2021-05-23: qty 1

## 2021-05-23 MED ORDER — PREDNISONE 20 MG PO TABS
40.0000 mg | ORAL_TABLET | Freq: Every day | ORAL | Status: DC
  Administered 2021-05-23 – 2021-05-26 (×4): 40 mg via ORAL
  Filled 2021-05-23 (×4): qty 2

## 2021-05-23 MED ORDER — LEVOTHYROXINE SODIUM 25 MCG PO TABS
25.0000 ug | ORAL_TABLET | Freq: Every day | ORAL | Status: DC
  Administered 2021-05-23 – 2021-05-26 (×4): 25 ug via ORAL
  Filled 2021-05-23 (×4): qty 1

## 2021-05-23 MED ORDER — CARVEDILOL 12.5 MG PO TABS
12.5000 mg | ORAL_TABLET | Freq: Two times a day (BID) | ORAL | Status: DC
  Administered 2021-05-24 – 2021-05-26 (×4): 12.5 mg via ORAL
  Filled 2021-05-23: qty 2
  Filled 2021-05-23 (×3): qty 1

## 2021-05-23 MED ORDER — PANTOPRAZOLE SODIUM 40 MG PO TBEC
40.0000 mg | DELAYED_RELEASE_TABLET | Freq: Every day | ORAL | Status: DC
  Administered 2021-05-24 – 2021-05-26 (×3): 40 mg via ORAL
  Filled 2021-05-23 (×3): qty 1

## 2021-05-23 NOTE — ED Notes (Signed)
Sitting with patient at this time.

## 2021-05-23 NOTE — ED Provider Notes (Addendum)
[facility] Emergency Department Provider Note  ____________________________________________   Event Date/Time   First MD Initiated Contact with Patient 05/23/21 0005     (approximate)  I have reviewed the triage vital signs and the nursing notes.   HISTORY  Chief Complaint Fall    HPI Carl [name] is a 60 y.o. male with history of TBI, hypertension, CAD status post CABG, CVA, CHF status post pacemaker, chronic kidney disease, COPD, bipolar disorder, PAF on Eliquis, MV repair who presents to the emergency department from [place] [facility] living [facility] after he had a fall.  It is very difficult to get a clear history from patient.  He is unable to tell me why he fell but states he did hit his head.  He is not able to tell me if he is on blood thinners but it looks like per our records he is on Eliquis.  He has not complaining of any headache, neck pain but is complaining of lower lumbar pain, sacral pain.  Also complaining of left hand pain.  Patient currently speaking in short sentences and has audible wheezing on exam.  He states he is feeling short of breath.  No fevers, cough, chest pain.  Patient states he is still smoking.        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   Seizures (HCC)    childhood   Stroke (HCC)    Tobacco abuse     Patient Active Problem List   Diagnosis Date Noted   COPD with acute exacerbation (HCC) 05/23/2021   PAF (paroxysmal atrial fibrillation) (HCC) 05/23/2021   Chronic anticoagulation 05/23/2021    Abdominal aortic aneurysm (AAA) 35 to 39 mm in diameter 05/23/2021   Ischemic cardiomyopathy 05/22/2020   Chronic systolic heart failure (HCC) 05/22/2020   HCAP (healthcare-associated pneumonia) 01/24/2020   Hyperkalemia 01/24/2020   Acute on chronic systolic (congestive) heart failure (HCC) 01/24/2020   Seizure (HCC) 01/24/2020   HLD (hyperlipidemia) 01/24/2020   Ascites due to alcoholic cirrhosis (HCC)    Acute on chronic systolic CHF (congestive heart failure) (HCC) 11/18/2019   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/18/2019   Elevated troponin 11/18/2019   Hypoxia 11/18/2019   Acute renal failure superimposed on stage 3a chronic kidney disease (HCC) 11/18/2019   Coronary artery disease 05/15/2019   Essential hypertension 05/15/2019   History of stroke 05/15/2019   Homicidal ideation 05/15/2019   Hypothyroidism 05/15/2019   Constipation 05/06/2019   Scrotal mass 05/06/2019   Bipolar 1 disorder (HCC) 12/23/2018   Acute on chronic respiratory failure with hypoxia (HCC) 11/03/2018   Chronic pain of left knee 09/17/2018   Atypical chest pain 10/11/2017   Polypharmacy 08/05/2017   Cerebrovascular accident (CVA) due to stenosis of left middle cerebral artery (HCC) 06/26/2017   Urinary retention due to benign prostatic hyperplasia 06/22/2017   Falls 06/20/2017   Risk for falls 02/19/2017   Dyslipidemia 11/19/2016   Peripheral artery disease (HCC) 08/28/2016   Claudication (HCC) 08/09/2016   History of alcohol abuse 08/09/2016  History of drug dependence/abuse ([place]) 08/09/2016   Severe single current episode of major depressive disorder, without psychotic features ([place]) 08/09/2016   Mitral insufficiency 01/04/2016   Prediabetes 12/29/2015   Fracture of left clavicle 07/13/2012    Past Surgical History:  Procedure Laterality Date   BIV UPGRADE N/A 05/05/2020   Procedure: BIV ICD UPGRADE;  Surgeon: Deboraha Sprang, MD;  Location: [place] [facility];  Service:  Cardiovascular;  Laterality: N/A;   CORONARY ARTERY BYPASS GRAFT      Prior to Admission medications   Medication Sig Start Date End Date Taking? Authorizing Provider  acetaminophen (TYLENOL) 500 MG tablet Take 500 mg by mouth every 6 (six) hours as needed for mild pain.    [provider]  albuterol (VENTOLIN HFA) 108 (90 Base) MCG/ACT inhaler Inhale 2 puffs into the lungs every 6 (six) hours as needed for wheezing. 05/24/19 04/28/22  Clapacs, Madie Reno, MD  apixaban (ELIQUIS) 5 MG TABS tablet TAKE 1 TABLET BY MOUTH TWICE A DAY 01/18/21   Deboraha Sprang, MD  carvedilol (COREG) 12.5 MG tablet TAKE 1 TABLET (12.5MG ) BY MOUTH TWICE A DAY 01/18/21   Deboraha Sprang, MD  divalproex (DEPAKOTE ER) 500 MG 24 hr tablet Take 1 tablet (500 mg total) by mouth 2 (two) times daily. 05/24/19   Clapacs, Madie Reno, MD  escitalopram (LEXAPRO) 10 MG tablet Take 1 tablet (10 mg total) by mouth at bedtime. 05/24/19   Clapacs, Madie Reno, MD  fenofibrate 160 MG tablet Take 160 mg by mouth daily.    [provider]  Fluticasone-Salmeterol (ADVAIR DISKUS) 250-50 MCG/DOSE AEPB Inhale 1 puff into the lungs 2 (two) times daily. 11/22/19 04/28/21  Max Sane, MD  furosemide (LASIX) 40 MG tablet Take 1 tablet (40 mg total) by mouth daily. 08/01/20   Deboraha Sprang, MD  levETIRAcetam (KEPPRA) 250 MG tablet Take 1 tablet (250 mg total) by mouth 2 (two) times daily. 03/24/20   Harvest Dark, MD  levothyroxine (SYNTHROID) 25 MCG tablet Take 1 tablet (25 mcg total) by mouth daily. 05/24/19 04/28/21  Clapacs, Madie Reno, MD  lisinopril (ZESTRIL) 2.5 MG tablet TAKE 1 TABLET BY MOUTH ONCE DAILY 01/21/21   Loel Dubonnet, NP  omeprazole (PRILOSEC) 20 MG capsule Take 20 mg by mouth daily.    [provider]  QUEtiapine (SEROQUEL) 100 MG tablet Take 100 mg by mouth at bedtime.    [provider]  QUEtiapine (SEROQUEL) 50 MG tablet Take 50 mg by mouth in the morning.    [provider]  simvastatin  (ZOCOR) 80 MG tablet Take 80 mg by mouth daily.    [provider]  tiotropium (SPIRIVA) 18 MCG inhalation capsule Place 18 mcg into inhaler and inhale daily.    [provider]    Allergies Iodinated diagnostic agents, Penicillins, Strawberry extract, Cefepime, Erythromycin, and Sulfa antibiotics  Family History  Problem Relation Age of Onset   Cancer Mother        "femal cancer"   Prostate cancer Father     Social History Social History   Tobacco Use   Smoking status: Every Day    Packs/day: 0.25    Years: 0.00    Pack years: 0.00    Types: Cigarettes   Smokeless tobacco: Never  Vaping Use   Vaping Use: Never used  Substance Use Topics   Alcohol use: Not Currently   Drug use: Never    Review of Systems Level 5 caveat due  to patient's confusion  ____________________________________________   PHYSICAL EXAM:  VITAL SIGNS: ED Triage Vitals  Enc Vitals Group     BP 05/22/21 1327 131/81     Pulse Rate 05/22/21 1327 69     Resp 05/22/21 1327 18     Temp 05/22/21 1327 98.8 F (37.1 C)     Temp Source 05/22/21 1327 Oral     SpO2 05/22/21 1327 93 %     Weight --      Height --      Head Circumference --      Peak Flow --      Pain Score 05/23/21 0008 8     Pain Loc --      Pain Edu? --      Excl. in GC? --    CONSTITUTIONAL: Alert and oriented person but not time and place and responds appropriately to questions. Well-appearing; well-nourished HEAD: Normocephalic, appears atraumatic EYES: Conjunctivae clear, pupils appear equal, EOM appear intact ENT: normal nose; moist mucous membranes NECK: Supple, normal ROM, no midline spinal tenderness or step-off or deformity CARD: RRR; S1 and S2 appreciated; + murmur, no clicks, no rubs, no gallops RESP: Patient is tachypneic.  He is speaking truncated sentences and has diffuse inspiratory and expiratory wheezing on exam without rhonchi or rales.  He is not in respiratory distress.  He has not hypoxic  on room air at rest. ABD/GI: Normal bowel sounds; non-distended; soft, non-tender, no rebound, no guarding, no peritoneal signs, no hepatosplenomegaly BACK: The back appears normal, small amount of tenderness over the lower lumbar spine without step-off or deformity.  He is very tender to palpation over the sacrum. EXT: Normal ROM in all joints; no deformity noted, no edema; no cyanosis, no calf tenderness or calf swelling.  Patient is tender to palpation over the dorsal left hand just proximal to the first and second digits without deformity.  Pelvis is stable and there is no leg length discrepancy. SKIN: Normal color for age and race; warm; no rash on exposed skin NEURO: Moves all extremities equally PSYCH: Patient is calm but does have some rapid and pressured speech but is redirectable.  ____________________________________________   LABS (all labs ordered are listed, but only abnormal results are displayed)  Labs Reviewed  CBC - Abnormal; Notable for the following components:      Result Value   RBC 4.20 (*)    HCT 38.8 (*)    All other components within normal limits  BASIC METABOLIC PANEL - Abnormal; Notable for the following components:   Glucose, Bld 101 (*)    Calcium 8.5 (*)    All other components within normal limits  RESP PANEL BY RT-PCR (FLU A&B, COVID) ARPGX2  HIV ANTIBODY (ROUTINE TESTING W REFLEX)  VALPROIC ACID LEVEL  URINALYSIS, COMPLETE (UACMP) WITH MICROSCOPIC   ____________________________________________  EKG   EKG Interpretation  Date/Time:  Tuesday May 22 2021 13:41:14 EST Ventricular Rate:  70 PR Interval:  194 QRS Duration: 158 QT Interval:  478 QTC Calculation: 516 R Axis:   91 Text Interpretation: Atrial-sensed ventricular-paced rhythm with Premature atrial complexes with Abberant conduction Abnormal ECG Confirmed by UNCONFIRMED, DOCTOR ([id number]), editor Mel Almond, Tammy [phone]) on 05/22/2021 2:55:35 PM         ____________________________________________  RADIOLOGY Jessie Foot Ilse Billman, personally viewed and evaluated these images (plain radiographs) as part of my medical decision making, as well as reviewing the written report by the radiologist.  ED MD interpretation: CT head shows no intracranial abnormality.  X-ray is unremarkable as well.  Official radiology report(s): DG Chest 2 View  Result Date: 05/23/2021 CLINICAL DATA:  Dyspnea EXAM: CHEST - 2 VIEW COMPARISON:  05/16/2020 FINDINGS: Lungs are clear. No pneumothorax or pleural effusion. Coronary artery bypass grafting and mitral valve replacement of been performed. Moderate cardiomegaly is likely stable when accounting for changes in patient positioning. Left subclavian pacemaker defibrillator is unchanged. Pulmonary vascularity is normal. Left clavicle ir or IF has been performed. No acute bone abnormality IMPRESSION: No active cardiopulmonary disease.  Stable cardiomegaly. Electronically Signed   By: Fidela Salisbury M.D.   On: 05/23/2021 01:10   DG Lumbar Spine Complete  Result Date: 05/23/2021 CLINICAL DATA:  Fall, pelvic pain EXAM: LUMBAR SPINE - COMPLETE 4+ VIEW COMPARISON:  None. FINDINGS: Normal lumbar lordosis. No acute fracture or listhesis of the lumbar spine. Vertebral body height and intervertebral disc heights are preserved. Extensive atherosclerotic calcification noted within the aortoiliac vasculature. Minimum 3.5 cm infrarenal abdominal aortic aneurysm noted, not well assessed on this examination. IMPRESSION: No acute fracture or listhesis of the lumbar spine. 3.5 cm minimum diameter infrarenal abdominal aortic aneurysm. This would be better assessed with dedicated CT or ultrasound imaging, if clinically indicated. Electronically Signed   By: Fidela Salisbury M.D.   On: 05/23/2021 01:12   DG Pelvis 1-2 Views  Result Date: 05/23/2021 CLINICAL DATA:  Fall, pelvic pain EXAM: PELVIS - 1-2 VIEW COMPARISON:  None. FINDINGS: There is no  evidence of pelvic fracture or diastasis. No pelvic bone lesions are seen. Advanced vascular calcifications are seen within the pelvis and medial thighs. IMPRESSION: No acute abnormality. Electronically Signed   By: Fidela Salisbury M.D.   On: 05/23/2021 01:13   DG Sacrum/Coccyx  Result Date: 05/23/2021 CLINICAL DATA:  Fall, pelvic pain EXAM: SACRUM AND COCCYX - 2+ VIEW COMPARISON:  None. FINDINGS: There is no evidence of fracture or other focal bone lesions. IMPRESSION: Negative. Electronically Signed   By: Fidela Salisbury M.D.   On: 05/23/2021 01:16   CT HEAD WO CONTRAST (5MM)  Result Date: 05/22/2021 CLINICAL DATA:  Head trauma EXAM: CT HEAD WITHOUT CONTRAST TECHNIQUE: Contiguous axial images were obtained from the base of the skull through the vertex without intravenous contrast. COMPARISON:  CT head dated March 24, 2020 FINDINGS: Brain: Old left MCA territory infarct. No evidence of acute infarction, hemorrhage, hydrocephalus, extra-axial collection or mass lesion/mass effect. Vascular: No hyperdense vessel or unexpected calcification. Skull: Complete opacification of the left maxillary sinus, unchanged compared to prior. Mucocele of the right maxillary sinus. No acute abnormality. Sinuses/Orbits: No acute finding. Other: None. IMPRESSION: No acute intracranial abnormality. Electronically Signed   By: Yetta Glassman M.D.   On: 05/22/2021 14:26   DG Hand Complete Left  Result Date: 05/23/2021 CLINICAL DATA:  Fall, left hand pain EXAM: LEFT HAND - COMPLETE 3+ VIEW COMPARISON:  None. FINDINGS: Normal alignment. No acute fracture or dislocation. A corticated density is seen dorsal to the proximal carpal row possibly representing a remote triquetral fracture. Degenerative changes are noted at the first [facility] joint and radiocarpal articulation. Remaining joint spaces are preserved. Soft tissues are unremarkable save for scattered vascular calcifications. IMPRESSION: No acute fracture or dislocation.  Electronically Signed   By: Fidela Salisbury M.D.   On: 05/23/2021 01:18    ____________________________________________   PROCEDURES  Procedure(s) performed (including Critical Care):  Procedures    ____________________________________________   INITIAL IMPRESSION / ASSESSMENT AND PLAN / ED COURSE  As part of my medical decision making,  I reviewed the following data within the [facility] notes reviewed and incorporated, Labs reviewed  EKG interpreted , Old EKG reviewed, Old chart reviewed, Radiograph reviewed , Discussed with admitting physician , and Notes from prior ED visits         Patient here after a fall at his assisted living facility.  He is unable to tell me why he fell.  He seems confused but per nursing notes this is his baseline.  Unfortunately has no family at bedside and have attempted to get in touch with his assisted living facility several times without success.  It appears he has a legal guardian.  I have reviewed his records from [facility] as well as cardiology at [facility] that do not seem to mention AMS/confusion.  CT head shows no abnormality here.  Complaining of lower back and sacral pain.    Also having left hand pain.  Will give pain medication here.  Will obtain x-rays.  Labs obtained from triage are unremarkable.  Patient is currently having a COPD exacerbation.  He does have some increased work of breathing and is speaking truncated sentences but is not hypoxic.  Will give duo nebs, prednisone and reassess.  ED PROGRESS  X-rays reviewed by myself and radiologist show no acute abnormality.  He does have a 3.5 cm infrarenal abdominal aneurysm but has no abdominal pain and had no symptoms concerning for dissection or rupture at this time.  I feel this is an incidental finding that can be followed as an outpatient.  Chest x-ray shows stable cardiomegaly but no infiltrate, edema or pneumothorax.  He continues to wheeze despite 2 DuoNeb treatments  but his work of breathing is improving.  Will give third treatment I discussed with medicine for admission.  2:30 AM  Discussed patient's case with hospitalist, Dr. Damita Dunnings.  I have recommended admission and patient (and family if present) agree with this plan. Admitting physician will place admission orders.   I reviewed all nursing notes, vitals, pertinent previous records and reviewed/interpreted all EKGs, lab and urine results, imaging (as available).  3:30 AM  On continued review of his records, I do not see anywhere that it states that he is confused at baseline.  This could be psychiatric in nature but will check Depakote level, urinalysis and also interrogate his pacemaker given we are not sure why he fell.  His EKG is nonischemic today and shows a paced rhythm.  4:30 AM  Hospitalist was able to have a more coherent conversation with the patient.  She was able to find in previous psychiatric notes that patient has history of possible TBI which likely explains his behavior/cognition in the ED.  6:04 AM  Spoke with Eddie Dibbles with [employer].  He states the device was last interrogated on Nov 14, 2020.  He states there are stable measurements and the device appears to be functioning as programmed.  No episodes noted in the past 24 to 48 hours.  His last episode of AT/AF was in June. ____________________________________________   FINAL CLINICAL IMPRESSION(S) / ED DIAGNOSES  Final diagnoses:  COPD exacerbation ([place])  Fall, initial encounter     ED Discharge Orders     None       *Please note:  Carl [name] was evaluated in Emergency Department on 05/23/2021 for the symptoms described in the history of present illness. He was evaluated in the context of the global COVID-19 pandemic, which necessitated consideration that the patient might be at risk for infection  with the SARS-CoV-2 virus that causes COVID-19. Institutional protocols and algorithms that pertain to the evaluation of patients at  risk for COVID-19 are in a state of rapid change based on information released by regulatory bodies including the CDC and federal and state organizations. These policies and algorithms were followed during the patient's care in the ED.  Some ED evaluations and interventions may be delayed as a result of limited staffing during and the pandemic.*   Note:  This document was prepared using Dragon voice recognition software and may include unintentional dictation errors.    Delshawn Stech, Delice Bison, DO 05/23/21 [place], Delice Bison, DO 05/23/21 [phone]

## 2021-05-23 NOTE — ED Notes (Signed)
Patient is sitting up eating his breakfast in his own.

## 2021-05-23 NOTE — H&P (Signed)
History and Physical    Carl [name] WGN:[id number] DOB: 04/15/1961 DOA: 05/23/2021  PCP: [facility]   Patient coming from: home  I have personally briefly reviewed patient's relevant medical records in [facility]  Chief Complaint: fall, sacral pain, wheezing  HPI: Carl [name] is a 60 y.o. male with medical history significant for pAF on Eliquis, CAD s/p CABG, systolic CHF [EF 78-46% 08/2020] ischemic cardiomyopathy s/p AICD, COPD, HTN, stroke, bipolar mood disorder, resident of SNF who was initially sent to the ED for evaluation of a fall but who developed acute wheezing and shortness of breath during his extended wait while in the ED.  Patient was hospitalized twice in 2021 for COPD exacerbation.  He denied chest pain, cough, fever or chills.  He endorses sacral pain from his fall and while he hit his head when he did fall denies headache.  ED course: On arrival, vitals within normal limits CBC and BMP unremarkable COVID and flu negative  EKG, personally viewed and interpreted: Paced rhythm at 77 with no acute ST-T wave changes  Imaging: Extensive imaging including CT head and x-rays of chest, pelvis, sacrum, left hand and pelvis with no evidence of acute trauma.AAA on lumbar x ray  Patient treated with duo nebs x3, prednisone, and given hydrocodone for pain.  Hospitalist consulted for admission.    Review of Systems: As per HPI otherwise all other systems on review of systems negative.    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 He has been smoking an average of 0.25 packs per day. He has never used smokeless tobacco. He reports that he does not currently use alcohol. He reports that he does not use drugs.  Allergies  Allergen Reactions   Iodinated Diagnostic Agents Shortness Of Breath   Penicillins Anaphylaxis and Shortness Of Breath    Respiratory  Tolerated cefuroxime on 01/06/16   Strawberry Extract Anaphylaxis   Cefepime Itching    Empiric antibiotic, developed pruritis.    Erythromycin Itching   Sulfa Antibiotics Itching, Nausea And Vomiting and Nausea Only    Family History  Problem Relation Age of Onset   Cancer Mother        "femal cancer"   Prostate cancer Father       Prior to Admission medications   Medication Sig Start Date End Date Taking? Authorizing Provider  acetaminophen (TYLENOL) 500 MG tablet Take 500 mg by mouth every 6 (six) hours as needed for mild pain.    [provider]  albuterol (VENTOLIN HFA) 108 (90 Base) MCG/ACT inhaler Inhale 2 puffs into the lungs  every 6 (six) hours as needed for wheezing. 05/24/19 04/28/22  Clapacs, Jackquline Denmark, MD  apixaban (ELIQUIS) 5 MG TABS tablet TAKE 1 TABLET BY MOUTH TWICE A DAY 01/18/21   Duke Salvia, MD  carvedilol (COREG) 12.5 MG tablet TAKE 1 TABLET (12.5MG ) BY MOUTH TWICE A DAY 01/18/21   Duke Salvia, MD  divalproex (DEPAKOTE ER) 500 MG 24 hr tablet Take 1 tablet (500 mg total) by mouth 2 (two) times daily. 05/24/19   Clapacs, Jackquline Denmark, MD  escitalopram (LEXAPRO) 10 MG tablet Take 1 tablet (10 mg total) by mouth at bedtime. 05/24/19   Clapacs, Jackquline Denmark, MD  fenofibrate 160 MG tablet  Take 160 mg by mouth daily.    [provider]  Fluticasone-Salmeterol (ADVAIR DISKUS) 250-50 MCG/DOSE AEPB Inhale 1 puff into the lungs 2 (two) times daily. 11/22/19 04/28/21  Delfino Lovett, MD  furosemide (LASIX) 40 MG tablet Take 1 tablet (40 mg total) by mouth daily. 08/01/20   Duke Salvia, MD  levETIRAcetam (KEPPRA) 250 MG tablet Take 1 tablet (250 mg total) by mouth 2 (two) times daily. 03/24/20   Minna Antis, MD  levothyroxine (SYNTHROID) 25 MCG tablet Take 1 tablet (25 mcg total) by mouth daily. 05/24/19 04/28/21  Clapacs, Jackquline Denmark, MD  lisinopril (ZESTRIL) 2.5 MG tablet TAKE 1 TABLET BY MOUTH ONCE DAILY 01/21/21   Alver Sorrow, NP  omeprazole (PRILOSEC) 20 MG capsule Take 20 mg by mouth daily.    [provider]  QUEtiapine (SEROQUEL) 100 MG tablet Take 100 mg by mouth at bedtime.    [provider]  QUEtiapine (SEROQUEL) 50 MG tablet Take 50 mg by mouth in the morning.    [provider]  simvastatin (ZOCOR) 80 MG tablet Take 80 mg by mouth daily.    [provider]  tiotropium (SPIRIVA) 18 MCG inhalation capsule Place 18 mcg into inhaler and inhale daily.    [provider]    Physical Exam: Vitals:   05/22/21 1845 05/22/21 2003 05/23/21 0008 05/23/21 0118  BP: 128/79 122/86 (!) 143/84 (!) 148/90  Pulse: 65 73 82 63  Resp: 18 18 19 18   Temp: 98.6 F (37 C)     TempSrc: Oral     SpO2: 99% 98% 96% 98%   Constitutional: Alert and oriented x 3 . Not in any apparent distress HEENT:      Head: Normocephalic and atraumatic.         Eyes: PERLA, EOMI, Conjunctivae are normal. Sclera is non-icteric.       Mouth/Throat: Mucous membranes are moist.       Neck: Supple with no signs of meningismus. Cardiovascular: Regular rate and rhythm. No murmurs, gallops, or rubs. 2+ symmetrical distal pulses are present . No JVD. No  LE edema Respiratory: Respiratory effort normal with bilateral rhonchi Gastrointestinal: Soft, non  tender, non distended. Positive bowel sounds.  Genitourinary: No CVA tenderness. Musculoskeletal: Nontender with normal range of motion in all extremities. No cyanosis, or erythema of extremities. Neurologic:  Face is symmetric. Moving all extremities. No gross focal neurologic deficits . Skin: Skin is warm, dry.  No rash or ulcers Psychiatric: Mood and affect are appropriate    Labs on Admission: I have personally reviewed following labs and imaging studies  CBC: Recent Labs  Lab 05/22/21 1333  WBC 8.5  HGB 13.1  HCT 38.8*  MCV 92.4  PLT 172   Basic Metabolic Panel: Recent Labs  Lab 05/22/21 1333  NA 135  K 3.6  CL 102  CO2 25  GLUCOSE 101*  BUN 15  CREATININE 1.18  CALCIUM 8.5*   GFR: CrCl cannot be calculated (Unknown ideal weight.). Liver Function Tests: No results for input(s): AST, ALT, ALKPHOS, BILITOT, PROT, ALBUMIN in the last 168 hours. No results for input(s): LIPASE, AMYLASE in the last 168 hours. No results for input(s): AMMONIA in the last 168 hours. Coagulation Profile: No results for input(s): INR, PROTIME in the last 168 hours. Cardiac Enzymes: No results for input(s): CKTOTAL, CKMB, CKMBINDEX, TROPONINI in the last 168 hours. BNP (last 3 results) No results for input(s): PROBNP in the last 8760 hours. HbA1C: No results for input(s): HGBA1C in the last 72 hours. CBG: No results for input(s): GLUCAP in the last 168 hours. Lipid Profile: No results for input(s): CHOL, HDL, LDLCALC, TRIG, CHOLHDL, LDLDIRECT in the last 72 hours. Thyroid Function Tests: No results for input(s): TSH, T4TOTAL, FREET4, T3FREE, THYROIDAB in the last 72 hours. Anemia Panel: No results for input(s): VITAMINB12, FOLATE, FERRITIN, TIBC, IRON, RETICCTPCT in the last 72 hours. Urine analysis:    Component Value Date/Time   COLORURINE YELLOW (A) 03/24/2020 1924   APPEARANCEUR CLEAR (A) 03/24/2020 1924   LABSPEC 1.017 03/24/2020 1924   PHURINE 6.0 03/24/2020 1924    GLUCOSEU NEGATIVE 03/24/2020 1924   HGBUR NEGATIVE 03/24/2020 1924   BILIRUBINUR NEGATIVE 03/24/2020 1924   KETONESUR NEGATIVE 03/24/2020 1924   PROTEINUR NEGATIVE 03/24/2020 1924   NITRITE NEGATIVE 03/24/2020 1924   LEUKOCYTESUR NEGATIVE 03/24/2020 1924    Radiological Exams on Admission: DG Chest 2 View  Result Date: 05/23/2021 CLINICAL DATA:  Dyspnea EXAM: CHEST - 2 VIEW COMPARISON:  05/16/2020 FINDINGS: Lungs are clear. No pneumothorax or pleural effusion. Coronary artery bypass grafting and mitral valve replacement of been performed. Moderate cardiomegaly is likely stable when accounting for changes in patient positioning. Left subclavian pacemaker defibrillator is unchanged. Pulmonary vascularity is normal. Left clavicle ir or IF has been performed. No acute bone abnormality IMPRESSION: No active cardiopulmonary disease.  Stable cardiomegaly. Electronically Signed   By: Helyn Numbers M.D.   On: 05/23/2021 01:10   DG Lumbar Spine Complete  Result Date: 05/23/2021 CLINICAL DATA:  Fall, pelvic pain EXAM: LUMBAR SPINE - COMPLETE 4+ VIEW COMPARISON:  None. FINDINGS: Normal lumbar lordosis. No acute fracture or listhesis of the lumbar spine. Vertebral body height and intervertebral disc heights are preserved. Extensive atherosclerotic calcification noted within the aortoiliac vasculature. Minimum 3.5 cm infrarenal abdominal aortic aneurysm noted, not well assessed on this examination. IMPRESSION: No acute fracture or listhesis of the lumbar spine. 3.5 cm minimum diameter infrarenal abdominal aortic aneurysm. This would be better assessed with dedicated CT or ultrasound imaging, if clinically indicated. Electronically Signed   By: Helyn Numbers M.D.   On: 05/23/2021 01:12   DG Pelvis 1-2 Views  Result Date: 05/23/2021 CLINICAL DATA:  Fall, pelvic pain EXAM: PELVIS - 1-2 VIEW COMPARISON:  None. FINDINGS: There is no evidence of pelvic fracture or diastasis. No pelvic bone lesions are seen.  Advanced vascular calcifications are seen within the pelvis and medial thighs. IMPRESSION: No acute abnormality. Electronically Signed   By: Helyn Numbers M.D.   On: 05/23/2021 01:13   DG Sacrum/Coccyx  Result Date: 05/23/2021 CLINICAL DATA:  Fall, pelvic pain EXAM: SACRUM AND COCCYX - 2+ VIEW COMPARISON:  None. FINDINGS: There is no evidence of fracture or other focal bone lesions. IMPRESSION: Negative. Electronically Signed   By: Helyn Numbers M.D.   On:  05/23/2021 01:16   CT HEAD WO CONTRAST ( )  Result Date: 05/22/2021 CLINICAL DATA:  Head trauma EXAM: CT HEAD WITHOUT CONTRAST TECHNIQUE: Contiguous axial images were obtained from the base of the skull through the vertex without intravenous contrast. COMPARISON:  CT head dated March 24, 2020 FINDINGS: Brain: Old left MCA territory infarct. No evidence of acute infarction, hemorrhage, hydrocephalus, extra-axial collection or mass lesion/mass effect. Vascular: No hyperdense vessel or unexpected calcification. Skull: Complete opacification of the left maxillary sinus, unchanged compared to prior. Mucocele of the right maxillary sinus. No acute abnormality. Sinuses/Orbits: No acute finding. Other: None. IMPRESSION: No acute intracranial abnormality. Electronically Signed   By: Allegra Lai M.D.   On: 05/22/2021 14:26   DG Hand Complete Left  Result Date: 05/23/2021 CLINICAL DATA:  Fall, left hand pain EXAM: LEFT HAND - COMPLETE 3+ VIEW COMPARISON:  None. FINDINGS: Normal alignment. No acute fracture or dislocation. A corticated density is seen dorsal to the proximal carpal row possibly representing a remote triquetral fracture. Degenerative changes are noted at the first [facility] Dt joint and radiocarpal articulation. Remaining joint spaces are preserved. Soft tissues are unremarkable save for scattered vascular calcifications. IMPRESSION: No acute fracture or dislocation. Electronically Signed   By: Helyn Numbers M.D.   On: 05/23/2021 01:18     Assessment/Plan    COPD with acute exacerbation (HCC) -Scheduled and as needed nebulized bronchodilator treatments -oral steroids, antitussives -Supplemental oxygen as needed  Fall at home, initial encounter Sacral pain - Pain control - Fall precautions    Coronary artery disease -Stable, with no complaints of chest pain -Continue carvedilol.  And simvastatin    Chronic systolic heart failure (HCC) Ischemic cardiomyopathy s/p AICD -Continue carvedilol, lisinopril and Lasix    Bipolar 1 disorder (HCC) Continue Depakote, Seroquel  Seizure disorder - Continue Keppra    Essential hypertension - Controlled, continue lisinopril and carvedilol    History of stroke - Continue Eliquis and simvastatin    Hypothyroidism - Continue levothyroxine    PAF (paroxysmal atrial fibrillation) (HCC)   Chronic anticoagulation - Continue Coreg and Eliquis  Infrarenal AAA on Lumbar xray -- for outpatient surveillance  DVT prophylaxis: Eliquis Code Status: full code  Family Communication:  none  Disposition Plan: Back to previous home environment Consults called: none  Status: Observation   Andris Baumann MD [facility]   05/23/2021, 3:08 AM

## 2021-05-23 NOTE — ED Notes (Signed)
Patient assisted to use urinal at bedside.  Provided with chocolate milk per request and a warm blanket.  Updated on plan of care

## 2021-05-23 NOTE — Progress Notes (Addendum)
PROGRESS NOTE   HPI was taken from Dr. Para March: Carl [name] is a 60 y.o. male with medical history significant for pAF on Eliquis, CAD s/p CABG, systolic CHF [EF 31-49% 08/2020] ischemic cardiomyopathy s/p AICD, COPD, HTN, stroke, bipolar mood disorder, resident of SNF who was initially sent to the ED for evaluation of a fall but who developed acute wheezing and shortness of breath during his extended wait while in the ED.  Patient was hospitalized twice in 2021 for COPD exacerbation.  He denied chest pain, cough, fever or chills.  He endorses sacral pain from his fall and while he hit his head when he did fall denies headache.   ED course: On arrival, vitals within normal limits CBC and BMP unremarkable COVID and flu negative   EKG, personally viewed and interpreted: Paced rhythm at 77 with no acute ST-T wave changes   Imaging: Extensive imaging including CT head and x-rays of chest, pelvis, sacrum, left hand and pelvis with no evidence of acute trauma.AAA on lumbar x ray   Patient treated with duo nebs x3, prednisone, and given hydrocodone for pain.  Hospitalist consulted for admission.    Cindy Brindisi  FWY:[id number] DOB: 1961/01/09 DOA: 05/23/2021 PCP: [facility]   Assessment & Plan:   Principal Problem:   COPD with acute exacerbation (HCC) Active Problems:   Bipolar 1 disorder (HCC)   Coronary artery disease   Essential hypertension   History of stroke   Hypothyroidism   AICD (automatic cardioverter/defibrillator) present   Ischemic cardiomyopathy   Chronic systolic heart failure (HCC)   PAF (paroxysmal atrial fibrillation) (HCC)   Chronic anticoagulation   Abdominal aortic aneurysm (AAA) 35 to 39 mm in diameter  Possible COPD exacerbation: continue on bronchodilators, steroids & encourage incentive spirometry    Fall at home: PT/OT consulted. Fall precautions    Hx of CAD: continue on coreg, lisinopril, statin. Pt denies any chest pain    Chronic  systolic heart failure : w/ hx of ischemic cardiomyopathy s/p AICD. Continue on coreg, statin, lisinoprol    Bipolar disorder: unknown type or severity. Continue on depakote, seroquel  Suicidal ideations: occurred overnight as per pt's nurse. Continue w/ sitter. Psych consulted   Encephalopathy: possibly metabolic vs toxic vs infection. CT head shows no acute intracranial findings. Oriented to person only. Urine cx ordered. Will order urine drug screen   Seizure disorder: continue on keppra   HTN: continue on coreg, lisinopril    Hx of CVA: continue on eliquis, statin    Hypothyroidism: continue on levothyroxine    PAF: continue on coreg, eliquis    Infrarenal AAA :on lumbar xray. Will need outpatient f/u     DVT prophylaxis: eliquis  Code Status: full  Family Communication:  Disposition Plan: unclear, depends on OT/PT recs & psych recs  Level of care: Med-Surg  Status is: Observation  The patient remains OBS appropriate and will d/c before 2 midnights.       Consultants:  Psych   Procedures:   Antimicrobials:  Subjective: Pt is pleasantly confused   Objective: Vitals:   05/23/21 0254 05/23/21 0552 05/23/21 0630 05/23/21 0904  BP:  128/66 122/68 127/64  Pulse: 89 70 65 60  Resp: (!) 28 (!) 24 (!) 23 19  Temp:      TempSrc:      SpO2: 93% 97% 96% 95%    Intake/Output Summary (Last 24 hours) at 05/23/2021 1707 Last data filed at 05/23/2021 1249 Gross per 24 hour  Intake  240 ml  Output --  Net 240 ml   There were no vitals filed for this visit.  Examination:  General exam: Appears calm and comfortable but confused Respiratory system: diminished breath sounds b/l  Cardiovascular system: S1 & S2 +. No rubs, gallops or clicks.  Gastrointestinal system: Abdomen is nondistended, soft and nontender. Normal bowel sounds heard. Central nervous system: Alert and awake. Moves all extremities  Psychiatry: Judgement and insight appear poor. Tangential  thinking     Data Reviewed: I have personally reviewed following labs and imaging studies  CBC: Recent Labs  Lab 05/22/21 1333  WBC 8.5  HGB 13.1  HCT 38.8*  MCV 92.4  PLT 172   Basic Metabolic Panel: Recent Labs  Lab 05/22/21 1333  NA 135  K 3.6  CL 102  CO2 25  GLUCOSE 101*  BUN 15  CREATININE 1.18  CALCIUM 8.5*   GFR: CrCl cannot be calculated (Unknown ideal weight.). Liver Function Tests: No results for input(s): AST, ALT, ALKPHOS, BILITOT, PROT, ALBUMIN in the last 168 hours. No results for input(s): LIPASE, AMYLASE in the last 168 hours. No results for input(s): AMMONIA in the last 168 hours. Coagulation Profile: No results for input(s): INR, PROTIME in the last 168 hours. Cardiac Enzymes: No results for input(s): CKTOTAL, CKMB, CKMBINDEX, TROPONINI in the last 168 hours. BNP (last 3 results) No results for input(s): PROBNP in the last 8760 hours. HbA1C: No results for input(s): HGBA1C in the last 72 hours. CBG: No results for input(s): GLUCAP in the last 168 hours. Lipid Profile: No results for input(s): CHOL, HDL, LDLCALC, TRIG, CHOLHDL, LDLDIRECT in the last 72 hours. Thyroid Function Tests: No results for input(s): TSH, T4TOTAL, FREET4, T3FREE, THYROIDAB in the last 72 hours. Anemia Panel: No results for input(s): VITAMINB12, FOLATE, FERRITIN, TIBC, IRON, RETICCTPCT in the last 72 hours. Sepsis Labs: No results for input(s): PROCALCITON, LATICACIDVEN in the last 168 hours.  Recent Results (from the past 240 hour(s))  Resp Panel by RT-PCR (Flu A&B, Covid) Nasopharyngeal Swab     Status: None   Collection Time: 05/23/21 12:18 AM   Specimen: Nasopharyngeal Swab; Nasopharyngeal(NP) swabs in vial transport medium  Result Value Ref Range Status   SARS Coronavirus 2 by RT PCR NEGATIVE NEGATIVE Final    Comment: (NOTE) SARS-CoV-2 target nucleic acids are NOT DETECTED.  The SARS-CoV-2 RNA is generally detectable in upper respiratory specimens during  the acute phase of infection. The lowest concentration of SARS-CoV-2 viral copies this assay can detect is 138 copies/mL. A negative result does not preclude SARS-Cov-2 infection and should not be used as the sole basis for treatment or other patient management decisions. A negative result may occur with  improper specimen collection/handling, submission of specimen other than nasopharyngeal swab, presence of viral mutation(s) within the areas targeted by this assay, and inadequate number of viral copies(<138 copies/mL). A negative result must be combined with clinical observations, patient history, and epidemiological information. The expected result is Negative.  Fact Sheet for Patients:  BloggerCourse.com  Fact Sheet for Healthcare Providers:  SeriousBroker.it  This test is no t yet approved or cleared by the [place] FDA and  has been authorized for detection and/or diagnosis of SARS-CoV-2 by FDA under an Emergency Use Authorization (EUA). This EUA will remain  in effect (meaning this test can be used) for the duration of the COVID-19 declaration under Section 564(b)(1) of the Act, 21 U.S.C.section 360bbb-3(b)(1), unless the authorization is terminated  or revoked sooner.  Influenza A by PCR NEGATIVE NEGATIVE Final   Influenza B by PCR NEGATIVE NEGATIVE Final    Comment: (NOTE) The Xpert Xpress SARS-CoV-2/FLU/RSV plus assay is intended as an aid in the diagnosis of influenza from Nasopharyngeal swab specimens and should not be used as a sole basis for treatment. Nasal washings and aspirates are unacceptable for Xpert Xpress SARS-CoV-2/FLU/RSV testing.  Fact Sheet for Patients: BloggerCourse.com  Fact Sheet for Healthcare Providers: SeriousBroker.it  This test is not yet approved or cleared by the [place] FDA and has been authorized for detection and/or  diagnosis of SARS-CoV-2 by FDA under an Emergency Use Authorization (EUA). This EUA will remain in effect (meaning this test can be used) for the duration of the COVID-19 declaration under Section 564(b)(1) of the Act, 21 U.S.C. section 360bbb-3(b)(1), unless the authorization is terminated or revoked.  Performed at [facility], [address] [place], [place] [address]          Radiology Studies: DG Chest 2 View  Result Date: 05/23/2021 CLINICAL DATA:  Dyspnea EXAM: CHEST - 2 VIEW COMPARISON:  05/16/2020 FINDINGS: Lungs are clear. No pneumothorax or pleural effusion. Coronary artery bypass grafting and mitral valve replacement of been performed. Moderate cardiomegaly is likely stable when accounting for changes in patient positioning. Left subclavian pacemaker defibrillator is unchanged. Pulmonary vascularity is normal. Left clavicle ir or IF has been performed. No acute bone abnormality IMPRESSION: No active cardiopulmonary disease.  Stable cardiomegaly. Electronically Signed   By: Helyn Numbers M.D.   On: 05/23/2021 01:10   DG Lumbar Spine Complete  Result Date: 05/23/2021 CLINICAL DATA:  Fall, pelvic pain EXAM: LUMBAR SPINE - COMPLETE 4+ VIEW COMPARISON:  None. FINDINGS: Normal lumbar lordosis. No acute fracture or listhesis of the lumbar spine. Vertebral body height and intervertebral disc heights are preserved. Extensive atherosclerotic calcification noted within the aortoiliac vasculature. Minimum 3.5 cm infrarenal abdominal aortic aneurysm noted, not well assessed on this examination. IMPRESSION: No acute fracture or listhesis of the lumbar spine. 3.5 cm minimum diameter infrarenal abdominal aortic aneurysm. This would be better assessed with dedicated CT or ultrasound imaging, if clinically indicated. Electronically Signed   By: Helyn Numbers M.D.   On: 05/23/2021 01:12   DG Pelvis 1-2 Views  Result Date: 05/23/2021 CLINICAL DATA:  Fall, pelvic pain EXAM: PELVIS -  1-2 VIEW COMPARISON:  None. FINDINGS: There is no evidence of pelvic fracture or diastasis. No pelvic bone lesions are seen. Advanced vascular calcifications are seen within the pelvis and medial thighs. IMPRESSION: No acute abnormality. Electronically Signed   By: Helyn Numbers M.D.   On: 05/23/2021 01:13   DG Sacrum/Coccyx  Result Date: 05/23/2021 CLINICAL DATA:  Fall, pelvic pain EXAM: SACRUM AND COCCYX - 2+ VIEW COMPARISON:  None. FINDINGS: There is no evidence of fracture or other focal bone lesions. IMPRESSION: Negative. Electronically Signed   By: Helyn Numbers M.D.   On: 05/23/2021 01:16   CT HEAD WO CONTRAST ( )  Result Date: 05/22/2021 CLINICAL DATA:  Head trauma EXAM: CT HEAD WITHOUT CONTRAST TECHNIQUE: Contiguous axial images were obtained from the base of the skull through the vertex without intravenous contrast. COMPARISON:  CT head dated March 24, 2020 FINDINGS: Brain: Old left MCA territory infarct. No evidence of acute infarction, hemorrhage, hydrocephalus, extra-axial collection or mass lesion/mass effect. Vascular: No hyperdense vessel or unexpected calcification. Skull: Complete opacification of the left maxillary sinus, unchanged compared to prior. Mucocele of the right maxillary sinus. No acute abnormality. Sinuses/Orbits: No  acute finding. Other: None. IMPRESSION: No acute intracranial abnormality. Electronically Signed   By: Allegra Lai M.D.   On: 05/22/2021 14:26   DG Hand Complete Left  Result Date: 05/23/2021 CLINICAL DATA:  Fall, left hand pain EXAM: LEFT HAND - COMPLETE 3+ VIEW COMPARISON:  None. FINDINGS: Normal alignment. No acute fracture or dislocation. A corticated density is seen dorsal to the proximal carpal row possibly representing a remote triquetral fracture. Degenerative changes are noted at the first [facility] joint and radiocarpal articulation. Remaining joint spaces are preserved. Soft tissues are unremarkable save for scattered vascular  calcifications. IMPRESSION: No acute fracture or dislocation. Electronically Signed   By: Helyn Numbers M.D.   On: 05/23/2021 01:18        Scheduled Meds:  apixaban  5 mg Oral BID   atorvastatin  40 mg Oral Daily   carvedilol  12.5 mg Oral BID WC   divalproex  500 mg Oral BID   escitalopram  10 mg Oral QHS   fenofibrate  160 mg Oral Daily   [START ON 05/24/2021] furosemide  40 mg Oral Daily   ipratropium-albuterol  3 mL Nebulization Q6H   levETIRAcetam  250 mg Oral BID   levothyroxine  25 mcg Oral Daily   lisinopril  2.5 mg Oral Daily   pantoprazole  40 mg Oral Daily   predniSONE  40 mg Oral Q breakfast   QUEtiapine  100 mg Oral QHS   [START ON 05/24/2021] QUEtiapine  50 mg Oral Q breakfast   Continuous Infusions:   LOS: 0 days    Time spent: 33 mins     Charise Killian, MD [facility] Pager [phone]  If 7PM-7AM, please contact night-coverage 05/23/2021, 5:07 PM

## 2021-05-23 NOTE — ED Notes (Signed)
Patient had two pieces of french toast and 6 oz cup of coffee.

## 2021-05-23 NOTE — Consult Note (Signed)
[facility] Face-to-Face Psychiatry Consult   Reason for Consult: Presented to the ED after a fall and mention to RN suicidal Referring Physician: EDP Patient Identification: Carl [name] MRN:  [id number] Principal Diagnosis: COPD with acute exacerbation (HCC) Diagnosis:  Principal Problem:   COPD with acute exacerbation (HCC) Active Problems:   Bipolar 1 disorder (HCC)   Coronary artery disease   Essential hypertension   History of stroke   Hypothyroidism   AICD (automatic cardioverter/defibrillator) present   Ischemic cardiomyopathy   Chronic systolic heart failure (HCC)   PAF (paroxysmal atrial fibrillation) (HCC)   Chronic anticoagulation   Abdominal aortic aneurysm (AAA) 35 to 39 mm in diameter   Total Time spent with patient: 1 hour  Subjective:   Carl [name] is a 60 y.o. male patient admitted with "they need to let me smoke more at the facility."  HPI: Patient was seen for psych consult after presenting from his assisted living facility after a fall.  Patient has history of bipolar 1 disorder, with several medical comorbidities.  Psych consult was requested because apparently the patient told a nurse that he had been having some suicidal thoughts and tried to hurt himself yesterday.  On evaluation, patient is pleasant and cooperative.  He is tangential and a bit disorganized, though pleasant and often smiles or giggles appropriately during interview.  He states that he has bipolar disorder and has been "crazy for a long time."  He states that he hits his head sometimes when he has racing thoughts.  He says "I do it is when he is 300 times.  It is my cardiologist.  I have made my last psychiatrist go crazy."  Patient talks about his disability that he lives in now, and them not allowing him to smoke more cigarettes.  That is a frustration for him.  He also talks about his roommate, who he says he has a good relationship with: "We help each other a lot and help each other keep going."  He  states that sometimes he "would just like to end it when it gets bad, but I would not do it."  Patient states " I could not do it, anyway and I know I need to keep going and get myself in a position to find my daughter and talk to her."  He gets very tangential and provides complaints against his daughter.  He talks about APS taking his any other "disabled" daughter from his home.  He speaks of other residents in his facility having enjoyable time together.  Patient laughs appropriately during this conversation, talking about how they can each other. Patient is alert to self, situation (fall and came here by ambulance, but doesn't know [place] or [place]) Patient denies any previous history of suicide attempts or self harming behaviors.  Denies auditory or visual hallucinations or paranoia.  Patient does not appear to be in need psychiatric hospitalization at this point.    Past Psychiatric History: He was hospitalized in this facility in November 2020.  Per chart review," patient has a long history of anger problems going back to childhood.  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   Seizures (HCC)    childhood   Stroke [facility])    Tobacco abuse     Past Surgical History:  Procedure Laterality Date   BIV UPGRADE N/A 05/05/2020   Procedure: BIV ICD UPGRADE;  Surgeon: Duke Salvia, MD;  Location: [facility] INVASIVE CV  LAB;  Service: Cardiovascular;  Laterality: N/A;   CORONARY ARTERY BYPASS GRAFT     Family History:  Family History  Problem Relation Age of Onset   Cancer Mother        "femal cancer"   Prostate cancer Father    Family Psychiatric  History: none reported  Social History:  Social History   Substance and Sexual Activity  Alcohol Use Not Currently     Social History   Substance and Sexual Activity  Drug Use Never    Social History   Socioeconomic History   Marital status: Widowed    Spouse name: Not on file   Number of children: Not on file   Years of education: Not on file   Highest education level: Not on file  Occupational History   Not on file  Tobacco Use   Smoking status: Every Day    Packs/day: 0.25    Years: 0.00    Pack years: 0.00    Types: Cigarettes   Smokeless tobacco: Never  Vaping Use   Vaping Use: Never used  Substance and Sexual Activity   Alcohol use: Not Currently   Drug use: Never   Sexual activity: Not Currently    Birth control/protection: Abstinence  Other Topics Concern   Not on file  Social History Narrative   Not on file   Social Determinants of Health   Financial Resource Strain: Not on file  Food Insecurity: Not on file  Transportation Needs: Not on file  Physical Activity: Not on file  Stress: Not on file  Social Connections: Not on file   Additional Social History:    Allergies:   Allergies  Allergen Reactions   Iodinated Diagnostic Agents Shortness Of Breath   Penicillins Anaphylaxis and Shortness Of Breath    Respiratory  Tolerated cefuroxime on 01/06/16   Strawberry Extract Anaphylaxis   Cefepime Itching    Empiric antibiotic, developed pruritis.    Erythromycin Itching   Sulfa Antibiotics Itching, Nausea And Vomiting and Nausea Only    Labs:  Results for orders placed or performed during the hospital encounter of 05/23/21 (from the past 48 hour(s))  CBC     Status: Abnormal   Collection Time: 05/22/21  1:33  PM  Result Value Ref Range   WBC 8.5 4.0 - 10.5 K/uL   RBC 4.20 (L) 4.22 - 5.81 MIL/uL   Hemoglobin 13.1 13.0 - 17.0 g/dL   HCT 35.5 (L) 73.2 - 20.2 %   MCV 92.4 80.0 - 100.0 fL   MCH 31.2 26.0 - 34.0 pg   MCHC 33.8 30.0 - 36.0 g/dL   RDW 54.2 70.6 - 23.7 %   Platelets 172 150 - 400 K/uL   nRBC 0.0 0.0 - 0.2 %    Comment: Performed at [facility], [address] [place], [place] [address]  Basic metabolic panel     Status: Abnormal   Collection  Time: 05/22/21  1:33 PM  Result Value Ref Range   Sodium 135 135 - 145 mmol/L   Potassium 3.6 3.5 - 5.1 mmol/L   Chloride 102 98 - 111 mmol/L   CO2 25 22 - 32 mmol/L   Glucose, Bld 101 (H) 70 - 99 mg/dL    Comment: Glucose reference range applies only to samples taken after fasting for at least 8 hours.   BUN 15 8 - 23 mg/dL   Creatinine, Ser 2.44 0.61 - 1.24 mg/dL   Calcium 8.5 (L) 8.9 - 10.3 mg/dL   GFR, Estimated >01 >02 mL/min    Comment: (NOTE) Calculated using the CKD-EPI Creatinine Equation (2021)    Anion gap 8 5 - 15    Comment: Performed at [facility], [address], [place], [place] [address]  Resp Panel by RT-PCR (Flu A&B, Covid) Nasopharyngeal Swab     Status: None   Collection Time: 05/23/21 12:18 AM   Specimen: Nasopharyngeal Swab; Nasopharyngeal(NP) swabs in vial transport medium  Result Value Ref Range   SARS Coronavirus 2 by RT PCR NEGATIVE NEGATIVE    Comment: (NOTE) SARS-CoV-2 target nucleic acids are NOT DETECTED.  The SARS-CoV-2 RNA is generally detectable in upper respiratory specimens during the acute phase of infection. The lowest concentration of SARS-CoV-2 viral copies this assay can detect is 138 copies/mL. A negative result does not preclude SARS-Cov-2 infection and should not be used as the sole basis for treatment or other patient management decisions. A negative result may occur with  improper specimen collection/handling, submission of specimen other than nasopharyngeal  swab, presence of viral mutation(s) within the areas targeted by this assay, and inadequate number of viral copies(<138 copies/mL). A negative result must be combined with clinical observations, patient history, and epidemiological information. The expected result is Negative.  Fact Sheet for Patients:  BloggerCourse.com  Fact Sheet for Healthcare Providers:  SeriousBroker.it  This test is no t yet approved or cleared by the [place] FDA and  has been authorized for detection and/or diagnosis of SARS-CoV-2 by FDA under an Emergency Use Authorization (EUA). This EUA will remain  in effect (meaning this test can be used) for the duration of the COVID-19 declaration under Section 564(b)(1) of the Act, 21 U.S.C.section 360bbb-3(b)(1), unless the authorization is terminated  or revoked sooner.       Influenza A by PCR NEGATIVE NEGATIVE   Influenza B by PCR NEGATIVE NEGATIVE    Comment: (NOTE) The Xpert Xpress SARS-CoV-2/FLU/RSV plus assay is intended as an aid in the diagnosis of influenza from Nasopharyngeal swab specimens and should not be used as a sole basis for treatment. Nasal washings and aspirates are unacceptable for Xpert Xpress SARS-CoV-2/FLU/RSV testing.  Fact Sheet for Patients: BloggerCourse.com  Fact Sheet for Healthcare Providers: SeriousBroker.it  This test is not yet approved or cleared by the [place] FDA and has been authorized for detection and/or diagnosis of SARS-CoV-2 by FDA under an Emergency Use Authorization (EUA). This EUA will remain in effect (meaning this test can be used) for the duration of the COVID-19 declaration under Section 564(b)(1) of the Act, 21 U.S.C. section 360bbb-3(b)(1), unless the authorization is terminated or revoked.  Performed at [facility], [address] [place], [place] [address]   Valproic acid level      Status: Abnormal   Collection Time: 05/23/21  5:42 AM  Result Value Ref Range   Valproic Acid Lvl 48 (L) 50.0 - 100.0 ug/mL    Comment:  Performed at [facility], [address] [place], [place] [address]  HIV Antibody (routine testing w rflx)     Status: None   Collection Time: 05/23/21  5:43 AM  Result Value Ref Range   HIV Screen 4th Generation wRfx Non Reactive Non Reactive    Comment: Performed at [facility], [address] [place], [place] [address]  Urinalysis, Complete w Microscopic     Status: Abnormal   Collection Time: 05/23/21  5:44 AM  Result Value Ref Range   Color, Urine AMBER (A) YELLOW    Comment: BIOCHEMICALS MAY BE AFFECTED BY COLOR   APPearance CLEAR (A) CLEAR   Specific Gravity, Urine 1.025 1.005 - 1.030   pH 5.0 5.0 - 8.0   Glucose, UA NEGATIVE NEGATIVE mg/dL   Hgb urine dipstick NEGATIVE NEGATIVE   Bilirubin Urine NEGATIVE NEGATIVE   Ketones, ur NEGATIVE NEGATIVE mg/dL   Protein, ur 30 (A) NEGATIVE mg/dL   Nitrite NEGATIVE NEGATIVE   Leukocytes,Ua NEGATIVE NEGATIVE   RBC / HPF 11-20 0 - 5 RBC/hpf   WBC, UA 0-5 0 - 5 WBC/hpf   Bacteria, UA NONE SEEN NONE SEEN   Squamous Epithelial / LPF NONE SEEN 0 - 5   Mucus PRESENT     Comment: Performed at [facility], [address], [place], [place] [address]    Current Facility-Administered Medications  Medication Dose Route Frequency Provider Last Rate Last Admin   acetaminophen (TYLENOL) tablet 500 mg  500 mg Oral Q6H PRN Andris Baumann, MD       albuterol (PROVENTIL) (2.5 MG/3ML) 0.083% nebulizer solution 2.5 mg  2.5 mg Nebulization Q2H PRN Andris Baumann, MD       apixaban Everlene Balls) tablet 5 mg  5 mg Oral BID Andris Baumann, MD       atorvastatin (LIPITOR) tablet 40 mg  40 mg Oral Daily Lindajo Royal V, MD       carvedilol (COREG) tablet 12.5 mg  12.5 mg Oral BID WC Andris Baumann, MD       divalproex (DEPAKOTE ER) 24 hr tablet 500 mg  500 mg Oral BID Andris Baumann, MD        escitalopram (LEXAPRO) tablet 10 mg  10 mg Oral QHS Andris Baumann, MD       fenofibrate tablet 160 mg  160 mg Oral Daily Andris Baumann, MD       [START ON 05/24/2021] furosemide (LASIX) tablet 40 mg  40 mg Oral Daily Andris Baumann, MD       HYDROcodone-acetaminophen (NORCO/VICODIN) 5-325 MG per tablet 1-2 tablet  1-2 tablet Oral Q4H PRN Andris Baumann, MD       ipratropium-albuterol (DUONEB) 0.5-2.5 (3) MG/3ML nebulizer solution 3 mL  3 mL Nebulization Q6H Andris Baumann, MD   3 mL at 05/23/21 0917   levETIRAcetam (KEPPRA) tablet 250 mg  250 mg Oral BID Andris Baumann, MD       levothyroxine (SYNTHROID) tablet 25 mcg  25 mcg Oral Daily Andris Baumann, MD   25 mcg at 05/23/21 0610   lisinopril (ZESTRIL) tablet 2.5 mg  2.5 mg Oral Daily Andris Baumann, MD       mometasone-formoterol [facility]) 200-5 MCG/ACT inhaler 2 puff  2 puff Inhalation BID Charise Killian, MD       ondansetron [facility]) tablet 4 mg  4 mg Oral Q6H PRN Andris Baumann, MD  Or   ondansetron (ZOFRAN) injection 4 mg  4 mg Intravenous Q6H PRN Andris Baumann, MD       pantoprazole (PROTONIX) EC tablet 40 mg  40 mg Oral Daily Andris Baumann, MD       predniSONE (DELTASONE) tablet 40 mg  40 mg Oral Q breakfast Andris Baumann, MD   40 mg at 05/23/21 0916   QUEtiapine (SEROQUEL) tablet 100 mg  100 mg Oral QHS Andris Baumann, MD       [START ON 05/24/2021] QUEtiapine (SEROQUEL) tablet 50 mg  50 mg Oral Q breakfast Andris Baumann, MD       Current Outpatient Medications  Medication Sig Dispense Refill   acetaminophen (TYLENOL) 500 MG tablet Take 500 mg by mouth every 6 (six) hours as needed for mild pain.     albuterol (VENTOLIN HFA) 108 (90 Base) MCG/ACT inhaler Inhale 2 puffs into the lungs every 6 (six) hours as needed for wheezing. 8 g 1   apixaban (ELIQUIS) 5 MG TABS tablet TAKE 1 TABLET BY MOUTH TWICE A DAY 60 tablet 6   carvedilol (COREG) 12.5 MG tablet TAKE 1 TABLET (12.5MG ) BY MOUTH TWICE A DAY 60  tablet 11   divalproex (DEPAKOTE ER) 500 MG 24 hr tablet Take 1 tablet (500 mg total) by mouth 2 (two) times daily. 60 tablet 1   escitalopram (LEXAPRO) 10 MG tablet Take 1 tablet (10 mg total) by mouth at bedtime. 30 tablet 1   fenofibrate 160 MG tablet Take 160 mg by mouth daily.     Fluticasone-Salmeterol (ADVAIR DISKUS) 250-50 MCG/DOSE AEPB Inhale 1 puff into the lungs 2 (two) times daily. 60 each 0   folic acid (FOLVITE) 1 MG tablet Take 1 mg by mouth daily.     furosemide (LASIX) 40 MG tablet Take 1 tablet (40 mg total) by mouth daily. 90 tablet 3   levETIRAcetam (KEPPRA) 250 MG tablet Take 1 tablet (250 mg total) by mouth 2 (two) times daily. 60 tablet 0   levothyroxine (SYNTHROID) 25 MCG tablet Take 1 tablet (25 mcg total) by mouth daily. 30 tablet 1   lisinopril (ZESTRIL) 2.5 MG tablet TAKE 1 TABLET BY MOUTH ONCE DAILY 30 tablet 11   omeprazole (PRILOSEC) 20 MG capsule Take 20 mg by mouth daily.     QUEtiapine (SEROQUEL) 100 MG tablet Take 200 mg by mouth at bedtime.     QUEtiapine (SEROQUEL) 50 MG tablet Take 50 mg by mouth in the morning.     tiotropium (SPIRIVA) 18 MCG inhalation capsule Place 18 mcg into inhaler and inhale daily.     simvastatin (ZOCOR) 80 MG tablet Take 80 mg by mouth daily.      Musculoskeletal: Strength & Muscle Tone:  did not assess Gait & Station:  did not assess Patient leans: N/A     Psychiatric Specialty Exam:  Presentation  General Appearance: Disheveled Eye Contact:Good Speech:Clear and Coherent Speech Volume:Normal Handedness:No data recorded  Mood and Affect  Mood:Euthymic Affect:Appropriate  Thought Process  Thought Processes:Disorganized (possibly at baseline) Descriptions of Associations:Tangential Orientation:Partial Thought Content:Perseveration; Scattered History of Schizophrenia/Schizoaffective disorder:No data recorded Duration of Psychotic Symptoms:No data recorded Hallucinations:Hallucinations: None Ideas of  Reference:None Suicidal Thoughts:Suicidal Thoughts: No Homicidal Thoughts:Homicidal Thoughts: No  Sensorium  Memory:Immediate Fair Judgment:Fair Insight:Fair  Executive Functions  Concentration:Poor Attention Span:Fair Recall:Fair Fund of Knowledge:Fair Language:Fair  Psychomotor Activity  Psychomotor Activity:Psychomotor Activity: Normal  Assets  Assets:Resilience; Housing  Sleep  Sleep:No data recorded  Physical Exam: Physical  Exam Vitals and nursing note reviewed.  HENT:     Head: Normocephalic.  Eyes:     General:        Right eye: No discharge.        Left eye: No discharge.  Cardiovascular:     Rate and Rhythm: Normal rate.  Pulmonary:     Effort: Pulmonary effort is normal.  Musculoskeletal:        General: Normal range of motion.     Cervical back: Normal range of motion.  Skin:    General: Skin is dry.  Neurological:     Mental Status: He is alert. Mental status is at baseline.  Psychiatric:        Mood and Affect: Mood normal.   Review of Systems  Psychiatric/Behavioral:  Positive for depression (stable). Negative for hallucinations, memory loss, substance abuse and suicidal ideas. The patient is not nervous/anxious and does not have insomnia.   All other systems reviewed and are negative. Blood pressure 127/64, pulse 60, temperature 98.6 F (37 C), temperature source Oral, resp. rate 19, SpO2 95 %. There is no height or weight on file to calculate BMI.  Treatment Plan Summary: Plan 60 year old male presenting to the ED after a fall at his assisted living facility.  Patient had voiced some suicidal thoughts to RN.  Psychiatric assessment reveals no apparent imminent risk. Psychiatry can continue to follow while patient is hospitalized.   Disposition: No evidence of imminent risk to self or others at present.   Patient does not meet criteria for psychiatric inpatient admission.  Vanetta Mulders, NP 05/23/2021 5:44 PM

## 2021-05-23 NOTE — ED Notes (Signed)
Patient assisted to stand at side of bed to use urinal.  Patient with hx of confusion at baseline.  Currently talking about "if I see Gwen I am not going to be happy.  I can't stand here."  Patient unable to tell RN who "Dedra Skeens" is.  Talking about his room mate and how "I hit the metal door over and over again."  Unable to redirect patient to answer orientation questions.  Does recall that he is from a nursing facility and that "my room mate is a prisoner, but he has my back."  Patient assisted back to bed.  Sheet and pillow readjusted for comfort.

## 2021-05-23 NOTE — Plan of Care (Addendum)
Patient shared with me, RN, that he's been struggling with suicidal thoughts and even tried to hurt himself yesterday.  Sitter pulled to sit (for safety - Hosp notified and placed psych consult to assess.  1345 -Psych rounded and assessed.  Did not feel patient needed a psych admission or suicide sitter at this time.    I discontinued suicide sitter and changed to Recruitment consultant.  Patient continues to be High Fall risk - Admitted d/t fall, has AMS, weak and not following instructions.

## 2021-05-23 NOTE — ED Notes (Signed)
Walked patient to bathroom.

## 2021-05-23 NOTE — ED Notes (Signed)
Patient is resting watching TV at this time.

## 2021-05-24 DIAGNOSIS — Z95810 Presence of automatic (implantable) cardiac defibrillator: Secondary | ICD-10-CM

## 2021-05-24 DIAGNOSIS — Z7901 Long term (current) use of anticoagulants: Secondary | ICD-10-CM

## 2021-05-24 DIAGNOSIS — I714 Abdominal aortic aneurysm, without rupture, unspecified: Secondary | ICD-10-CM | POA: Diagnosis not present

## 2021-05-24 DIAGNOSIS — F319 Bipolar disorder, unspecified: Secondary | ICD-10-CM | POA: Diagnosis not present

## 2021-05-24 DIAGNOSIS — I10 Essential (primary) hypertension: Secondary | ICD-10-CM

## 2021-05-24 DIAGNOSIS — Z8673 Personal history of transient ischemic attack (TIA), and cerebral infarction without residual deficits: Secondary | ICD-10-CM

## 2021-05-24 DIAGNOSIS — J441 Chronic obstructive pulmonary disease with (acute) exacerbation: Secondary | ICD-10-CM | POA: Diagnosis not present

## 2021-05-24 LAB — COMPREHENSIVE METABOLIC PANEL
ALT: 16 U/L (ref 0–44)
AST: 25 U/L (ref 15–41)
Albumin: 3 g/dL — ABNORMAL LOW (ref 3.5–5.0)
Alkaline Phosphatase: 34 U/L — ABNORMAL LOW (ref 38–126)
Anion gap: 9 (ref 5–15)
BUN: 25 mg/dL — ABNORMAL HIGH (ref 8–23)
CO2: 27 mmol/L (ref 22–32)
Calcium: 9.4 mg/dL (ref 8.9–10.3)
Chloride: 101 mmol/L (ref 98–111)
Creatinine, Ser: 1.07 mg/dL (ref 0.61–1.24)
GFR, Estimated: >60 mL/min (ref >60)
Glucose, Bld: 99 mg/dL (ref 70–99)
Potassium: 3.7 mmol/L (ref 3.5–5.1)
Sodium: 137 mmol/L (ref 135–145)
Total Bilirubin: 0.6 mg/dL (ref 0.3–1.2)
Total Protein: 6.3 g/dL — ABNORMAL LOW (ref 6.5–8.1)

## 2021-05-24 LAB — CBC
HCT: 34.4 % — ABNORMAL LOW (ref 39.0–52.0)
Hemoglobin: 11.4 g/dL — ABNORMAL LOW (ref 13.0–17.0)
MCH: 30.6 pg (ref 26.0–34.0)
MCHC: 33.1 g/dL (ref 30.0–36.0)
MCV: 92.2 fL (ref 80.0–100.0)
Platelets: 166 10*3/uL (ref 150–400)
RBC: 3.73 MIL/uL — ABNORMAL LOW (ref 4.22–5.81)
RDW: 15.5 % (ref 11.5–15.5)
WBC: 10.9 10*3/uL — ABNORMAL HIGH (ref 4.0–10.5)
nRBC: 0 % (ref 0.0–0.2)

## 2021-05-24 LAB — URINE CULTURE: Culture: NO GROWTH

## 2021-05-24 MED ORDER — DOXYCYCLINE HYCLATE 100 MG PO TABS
100.0000 mg | ORAL_TABLET | Freq: Two times a day (BID) | ORAL | Status: DC
  Administered 2021-05-24 – 2021-05-26 (×4): 100 mg via ORAL
  Filled 2021-05-24 (×5): qty 1

## 2021-05-24 NOTE — ED Notes (Signed)
Patient on phone with dietary at this time ordering lunch

## 2021-05-24 NOTE — ED Notes (Signed)
PAtient up ambulating with PT

## 2021-05-24 NOTE — Evaluation (Signed)
Physical Therapy Evaluation Patient Details Name: Carl [name] MRN: [id number] DOB: [birth year] Today's Date: 05/24/2021  History of Present Illness  Pt is a 60 y/o M with PMH: pAF on Eliquis, CAD s/p CABG, systolic CHF (EF 02-58% 08/2020) ischemic cardiomyopathy s/p AICD, COPD, HTN, stroke, bipolar mood disorder, resident of SNF who was initially sent to the ED for evaluation of a fall but who developed acute wheezing and shortness of breath during his extended wait while in the ED.  Patient was hospitalized twice in 2021 for COPD exacerbation.  He denied chest pain, cough, fever or chills.  He endorses sacral pain from his fall and while he hit his head when he did fall denies headache. w/u includes: CT head and x-rays of chest, pelvis, sacrum, left hand and pelvis with no evidence of acute trauma and AAA on lumbar x ray. Pt placed on observation for COPD exacerbation.  Clinical Impression  Pt was eager and willing to work with PT but did need some extra time and cuing to insure he stayed on task.  Pt with confident and relatively safe bout of ambulation using RW, despite reporting that he spends almost the entire day in the wheelchair.  Pt did well with mobility and though he is a little weaker and less steady than baseline he should be safe to return to his group home with HHPT following.      Recommendations for follow up therapy are one component of a multi-disciplinary discharge planning process, led by the attending physician.  Recommendations may be updated based on patient status, additional functional criteria and insurance authorization.  Follow Up Recommendations Home health PT    Assistance Recommended at Discharge None  Functional Status Assessment Patient has had a recent decline in their functional status and demonstrates the ability to make significant improvements in function in a reasonable and predictable amount of time.  Equipment Recommendations  Rolling walker (2 wheels) (pt  indicates that his walker is not in very good working order?)    Recommendations for Other Services       Precautions / Restrictions Precautions Precautions: Fall Restrictions Weight Bearing Restrictions: No      Mobility  Bed Mobility Overal bed mobility: Modified Independent             General bed mobility comments: Pt able to confidently get himself to sitting EOB w/o assist    Transfers Overall transfer level: Modified independent Equipment used: Rolling walker (2 wheels)               General transfer comment: Pt was able to rise to standing with minimal cuing to insure appropriate UE use.    Ambulation/Gait Ambulation/Gait assistance: Supervision Gait Distance (Feet): 250 Feet Assistive device: Rolling walker (2 wheels)         General Gait Details: Pt was able to confidently ambulate up/down the hallway with light UE use on the walker and no overt LOBs.  Stairs            Wheelchair Mobility    Modified Rankin (Stroke Patients Only)       Balance Overall balance assessment: Modified Independent                                           Pertinent Vitals/Pain      Home Living Family/patient expects to be discharged to:: Assisted living  Home Equipment: Agricultural consultant (2 wheels) Additional Comments: ramped entrance    Prior Function Prior Level of Function : Needs assist             Mobility Comments: Pt reports primarily being in w/c at facility (per staff making him), but reports he walks with walker  and sometimes with nothing Unsure of efficacy.       Hand Dominance        Extremity/Trunk Assessment   Upper Extremity Assessment Upper Extremity Assessment: Overall WFL for tasks assessed;Difficult to assess due to impaired cognition    Lower Extremity Assessment Lower Extremity Assessment: Overall WFL for tasks assessed;Difficult to assess due to impaired cognition        Communication   Communication: Other (comment)  Cognition Arousal/Alertness: Awake/alert Behavior During Therapy: WFL for tasks assessed/performed Overall Cognitive Status: History of cognitive impairments - at baseline Area of Impairment: Attention                                        General Comments General comments (skin integrity, edema, etc.): Pt with some mild impulsivity and need for reinforcement to insure appropriate safety awareness and directional assist    Exercises     Assessment/Plan    PT Assessment Patient needs continued PT services  PT Problem List Decreased strength;Decreased range of motion;Decreased activity tolerance;Decreased mobility;Decreased balance;Decreased coordination;Decreased cognition;Decreased knowledge of use of DME;Decreased safety awareness       PT Treatment Interventions DME instruction;Stair training;Functional mobility training;Therapeutic activities;Therapeutic exercise;Balance training;Cognitive remediation;Patient/family education;Gait training    PT Goals (Current goals can be found in the Care Plan section)  Acute Rehab PT Goals Patient Stated Goal: Go home PT Goal Formulation: With patient Time For Goal Achievement: 06/07/21 Potential to Achieve Goals: Good    Frequency Min 2X/week   Barriers to discharge        Co-evaluation               AM-PAC PT "6 Clicks" Mobility  Outcome Measure Help needed turning from your back to your side while in a flat bed without using bedrails?: A Little Help needed moving from lying on your back to sitting on the side of a flat bed without using bedrails?: A Little Help needed moving to and from a bed to a chair (including a wheelchair)?: A Little Help needed standing up from a chair using your arms (e.g., wheelchair or bedside chair)?: A Little Help needed to walk in hospital room?: A Little Help needed climbing 3-5 steps with a railing? : A Little 6 Click Score:  18    End of Session Equipment Utilized During Treatment: Gait belt Activity Tolerance: Patient tolerated treatment well Patient left: with bed alarm set;with call bell/phone within reach Nurse Communication: Mobility status PT Visit Diagnosis: Muscle weakness (generalized) (M62.81);Difficulty in walking, not elsewhere classified (R26.2);Unsteadiness on feet (R26.81)    Time: 1093-2355 PT Time Calculation (min) (ACUTE ONLY): 23 min   Charges:   PT Evaluation $PT Eval Low Complexity: 1 Low PT Treatments $Gait Training: 8-22 mins        Malachi Pro, DPT 05/24/2021, 3:23 PM

## 2021-05-24 NOTE — ED Notes (Signed)
Patietn stood at bedside to void.

## 2021-05-24 NOTE — ED Notes (Signed)
Informed RN bed assigned 

## 2021-05-24 NOTE — Progress Notes (Signed)
[facility]  PROGRESS NOTE  Carl [name] [id number] DOB: 09-Dec-1960 DOA: 05/23/2021 PCP: [facility]   Brief HPI:   60 year old male with medical history of paroxysmal atrial fibrillation on Eliquis, CAD s/p CABG, systolic CHF with EF 30 to 35%, ischemic cardiomyopathy, s/p AICD, COPD, hypertension, stroke, bipolar mood disorder, resident of skilled nursing facility was sent to the ED for evaluation of fall but developed acute wheezing and shortness of breath while waiting in the ED.  Patient was hospitalized in 2021 for COPD exacerbation.  Denies chest pain, cough, fever or chills. In the ED extensive imaging was negative for acute trauma  Patient treated with duo nebs x3 prednisone in the ED  Subjective   Patient seen and examined, continues to have wheezing.   Assessment/Plan:    COPD exacerbation -Patient still has wheezing bilaterally -Continue DuoNeb nebulizers every 6 hours -Prednisone 40 mg daily -We will add doxycycline 100 mg p.o. twice daily -Continue Dulera 2 puffs inhalation 2 times daily  S/p fall at home -PT/OT consulted  History of CAD -Stable -Continue Coreg, lisinopril, statin  Encephalopathy -Resolved, unclear etiology -CT head showed no acute findings  Chronic systolic heart failure -Continue home medications including Coreg, statin, lisinopril, Lasix -Follow BMP in am  Suicidal ideation -Seen by psychiatry, -Cleared, no threat to self or others -No need for inpatient psychiatric admission  Seizure disorder -Continue Keppra  Hypertension -Continue Coreg, lisinopril  History of CVA -Continue Eliquis, statin  Hypothyroidism -Continue Synthroid  Infrarenal AAA -Seen on lumbar x-ray -Outpatient follow-up  Medications     apixaban  5 mg Oral BID   atorvastatin  40 mg Oral Daily   carvedilol  12.5 mg Oral BID WC   divalproex  500 mg Oral BID   escitalopram  10 mg Oral QHS   fenofibrate  160 mg Oral Daily    furosemide  40 mg Oral Daily   ipratropium-albuterol  3 mL Nebulization Q6H   levETIRAcetam  250 mg Oral BID   levothyroxine  25 mcg Oral Daily   lisinopril  2.5 mg Oral Daily   mometasone-formoterol  2 puff Inhalation BID   pantoprazole  40 mg Oral Daily   predniSONE  40 mg Oral Q breakfast   QUEtiapine  100 mg Oral QHS   QUEtiapine  50 mg Oral Q breakfast     Data Reviewed:   CBG:  No results for input(s): GLUCAP in the last 168 hours.  SpO2: 98 %    Vitals:   05/24/21 1200 05/24/21 1230 05/24/21 1317 05/24/21 1423  BP: 114/61 100/66 113/61   Pulse: 60 69 63   Resp: (!) 22 (!) 31 (!) 26 (!) 26  Temp:      TempSrc:      SpO2: 94% 96% 98%      Intake/Output Summary (Last 24 hours) at 05/24/2021 1517 Last data filed at 05/24/2021 1418 Gross per 24 hour  Intake --  Output 950 ml  Net -950 ml    11/15 1901 - 11/17 0700 In: 240 [P.O.:240] Out: -   There were no vitals filed for this visit.  Data Reviewed: Basic Metabolic Panel: Recent Labs  Lab 05/22/21 1333 05/24/21 0616  NA 135 137  K 3.6 3.7  CL 102 101  CO2 25 27  GLUCOSE 101* 99  BUN 15 25*  CREATININE 1.18 1.07  CALCIUM 8.5* 9.4   Liver Function Tests: Recent Labs  Lab 05/24/21 0616  AST 25  ALT 16  ALKPHOS 34*  BILITOT 0.6  PROT 6.3*  ALBUMIN 3.0*   No results for input(s): LIPASE, AMYLASE in the last 168 hours. No results for input(s): AMMONIA in the last 168 hours. CBC: Recent Labs  Lab 05/22/21 1333 05/24/21 0616  WBC 8.5 10.9*  HGB 13.1 11.4*  HCT 38.8* 34.4*  MCV 92.4 92.2  PLT 172 166   Cardiac Enzymes: No results for input(s): CKTOTAL, CKMB, CKMBINDEX, TROPONINI in the last 168 hours. BNP (last 3 results) No results for input(s): BNP in the last 8760 hours.  ProBNP (last 3 results) No results for input(s): PROBNP in the last 8760 hours.  CBG: No results for input(s): GLUCAP in the last 168 hours.     Radiology Reports  DG Chest 2 View  Result Date:  05/23/2021 CLINICAL DATA:  Dyspnea EXAM: CHEST - 2 VIEW COMPARISON:  05/16/2020 FINDINGS: Lungs are clear. No pneumothorax or pleural effusion. Coronary artery bypass grafting and mitral valve replacement of been performed. Moderate cardiomegaly is likely stable when accounting for changes in patient positioning. Left subclavian pacemaker defibrillator is unchanged. Pulmonary vascularity is normal. Left clavicle ir or IF has been performed. No acute bone abnormality IMPRESSION: No active cardiopulmonary disease.  Stable cardiomegaly. Electronically Signed   By: Fidela Salisbury M.D.   On: 05/23/2021 01:10   DG Lumbar Spine Complete  Result Date: 05/23/2021 CLINICAL DATA:  Fall, pelvic pain EXAM: LUMBAR SPINE - COMPLETE 4+ VIEW COMPARISON:  None. FINDINGS: Normal lumbar lordosis. No acute fracture or listhesis of the lumbar spine. Vertebral body height and intervertebral disc heights are preserved. Extensive atherosclerotic calcification noted within the aortoiliac vasculature. Minimum 3.5 cm infrarenal abdominal aortic aneurysm noted, not well assessed on this examination. IMPRESSION: No acute fracture or listhesis of the lumbar spine. 3.5 cm minimum diameter infrarenal abdominal aortic aneurysm. This would be better assessed with dedicated CT or ultrasound imaging, if clinically indicated. Electronically Signed   By: Fidela Salisbury M.D.   On: 05/23/2021 01:12   DG Pelvis 1-2 Views  Result Date: 05/23/2021 CLINICAL DATA:  Fall, pelvic pain EXAM: PELVIS - 1-2 VIEW COMPARISON:  None. FINDINGS: There is no evidence of pelvic fracture or diastasis. No pelvic bone lesions are seen. Advanced vascular calcifications are seen within the pelvis and medial thighs. IMPRESSION: No acute abnormality. Electronically Signed   By: Fidela Salisbury M.D.   On: 05/23/2021 01:13   DG Sacrum/Coccyx  Result Date: 05/23/2021 CLINICAL DATA:  Fall, pelvic pain EXAM: SACRUM AND COCCYX - 2+ VIEW COMPARISON:  None. FINDINGS: There  is no evidence of fracture or other focal bone lesions. IMPRESSION: Negative. Electronically Signed   By: Fidela Salisbury M.D.   On: 05/23/2021 01:16   DG Hand Complete Left  Result Date: 05/23/2021 CLINICAL DATA:  Fall, left hand pain EXAM: LEFT HAND - COMPLETE 3+ VIEW COMPARISON:  None. FINDINGS: Normal alignment. No acute fracture or dislocation. A corticated density is seen dorsal to the proximal carpal row possibly representing a remote triquetral fracture. Degenerative changes are noted at the first [facility] joint and radiocarpal articulation. Remaining joint spaces are preserved. Soft tissues are unremarkable save for scattered vascular calcifications. IMPRESSION: No acute fracture or dislocation. Electronically Signed   By: Fidela Salisbury M.D.   On: 05/23/2021 01:18       Antibiotics: Anti-infectives (From admission, onward)    None         DVT prophylaxis: Apixaban  Code Status: Full code  Family Communication: No family at bedside   Consultants:  Procedures:     Objective    Physical Examination:   General-appears in no acute distress Heart-S1-S2, regular, no murmur auscultated Lungs-bilateral wheezing auscultated Abdomen-soft, nontender, no organomegaly Extremities-no edema in the lower extremities Neuro-alert, oriented x3, no focal deficit noted  Status is: Inpatient  Dispo: The patient is from: Home              Anticipated d/c is to: Home              Anticipated d/c date is: 05/26/2021              Patient currently not stable for discharge  Barrier to discharge-COPD exacerbation  COVID-19 Labs  No results for input(s): DDIMER, FERRITIN, LDH, CRP in the last 72 hours.  Lab Results  Component Value Date   SARSCOV2NAA NEGATIVE 05/23/2021   [place] NEGATIVE 05/02/2020   [place] NEGATIVE 01/24/2020   [place] NEGATIVE 11/18/2019            Recent Results (from the past 240 hour(s))  Resp Panel by RT-PCR (Flu A&B, Covid)  Nasopharyngeal Swab     Status: None   Collection Time: 05/23/21 12:18 AM   Specimen: Nasopharyngeal Swab; Nasopharyngeal(NP) swabs in vial transport medium  Result Value Ref Range Status   SARS Coronavirus 2 by RT PCR NEGATIVE NEGATIVE Final    Comment: (NOTE) SARS-CoV-2 target nucleic acids are NOT DETECTED.  The SARS-CoV-2 RNA is generally detectable in upper respiratory specimens during the acute phase of infection. The lowest concentration of SARS-CoV-2 viral copies this assay can detect is 138 copies/mL. A negative result does not preclude SARS-Cov-2 infection and should not be used as the sole basis for treatment or other patient management decisions. A negative result may occur with  improper specimen collection/handling, submission of specimen other than nasopharyngeal swab, presence of viral mutation(s) within the areas targeted by this assay, and inadequate number of viral copies(<138 copies/mL). A negative result must be combined with clinical observations, patient history, and epidemiological information. The expected result is Negative.  Fact Sheet for Patients:  EntrepreneurPulse.com.au  Fact Sheet for Healthcare Providers:  IncredibleEmployment.be  This test is no t yet approved or cleared by the [place] FDA and  has been authorized for detection and/or diagnosis of SARS-CoV-2 by FDA under an Emergency Use Authorization (EUA). This EUA will remain  in effect (meaning this test can be used) for the duration of the COVID-19 declaration under Section 564(b)(1) of the Act, 21 U.S.C.section 360bbb-3(b)(1), unless the authorization is terminated  or revoked sooner.       Influenza A by PCR NEGATIVE NEGATIVE Final   Influenza B by PCR NEGATIVE NEGATIVE Final    Comment: (NOTE) The Xpert Xpress SARS-CoV-2/FLU/RSV plus assay is intended as an aid in the diagnosis of influenza from Nasopharyngeal swab specimens and should not be  used as a sole basis for treatment. Nasal washings and aspirates are unacceptable for Xpert Xpress SARS-CoV-2/FLU/RSV testing.  Fact Sheet for Patients: EntrepreneurPulse.com.au  Fact Sheet for Healthcare Providers: IncredibleEmployment.be  This test is not yet approved or cleared by the [place] FDA and has been authorized for detection and/or diagnosis of SARS-CoV-2 by FDA under an Emergency Use Authorization (EUA). This EUA will remain in effect (meaning this test can be used) for the duration of the COVID-19 declaration under Section 564(b)(1) of the Act, 21 U.S.C. section 360bbb-3(b)(1), unless the authorization is terminated or revoked.  Performed at [facility], [address], [place], [place] [address]  Meredeth Ide   [facility] If 7PM-7AM, please contact night-coverage at [email or web address], Office  [phone]   05/24/2021, 3:17 PM  LOS: 0 days

## 2021-05-24 NOTE — Consult Note (Signed)
  Writer saw patient face-to-face in ED just prior to his admission to medical unit.(Early afternoon) Patient recognized Clinical research associate and smiled and said "hi" and that he was going to behave. Patient looked neat and clean. Stated his mood was "good." Denies any thoughts of harming himself or others. Speaks in linear sentences at this time. Not agitated.

## 2021-05-24 NOTE — Evaluation (Signed)
Occupational Therapy Evaluation Patient Details Name: Carl [name] MRN: [id number] DOB: 02/06/1961 Today's Date: 05/24/2021   History of Present Illness Pt is a 60 y/o M with PMH: pAF on Eliquis, CAD s/p CABG, systolic CHF (EF 15-86% 08/2020) ischemic cardiomyopathy s/p AICD, COPD, HTN, stroke, bipolar mood disorder, resident of SNF who was initially sent to the ED for evaluation of a fall but who developed acute wheezing and shortness of breath during his extended wait while in the ED.  Patient was hospitalized twice in 2021 for COPD exacerbation.  He denied chest pain, cough, fever or chills.  He endorses sacral pain from his fall and while he hit his head when he did fall denies headache. w/u includes: CT head and x-rays of chest, pelvis, sacrum, left hand and pelvis with no evidence of acute trauma and AAA on lumbar x ray. Pt placed on observation for COPD exacerbation.   Clinical Impression   Pt seen for OT evaluation this date in setting of acute hospitalization d/t COPD exacerbation. Pt reports living in ALF and having facility help for things like meals, but is relatively able to complete the rest of his self care w/o assistance. Pt is very tangential in speech and disoriented to date and situation so unsure of efficacy. Pt comes to sitting with MOD A and requires CGA for STS with hand held assist. Pt demos F standing balance, notably benefits from UE support with less sway noted. Pt requires MIN A for seated LB ADLs at this time d/t some decreased dynamic sitting balance and strength. He is near his functional baseline per his report, but will require HHOT f/u to ensure he is safe with self care in the natural environment.     Recommendations for follow up therapy are one component of a multi-disciplinary discharge planning process, led by the attending physician.  Recommendations may be updated based on patient status, additional functional criteria and insurance authorization.   Follow Up  Recommendations  Home health OT    Assistance Recommended at Discharge Intermittent Supervision/Assistance  Functional Status Assessment  Patient has had a recent decline in their functional status and demonstrates the ability to make significant improvements in function in a reasonable and predictable amount of time.  Equipment Recommendations  Tub/shower seat    Recommendations for Other Services       Precautions / Restrictions Precautions Precautions: Fall Restrictions Weight Bearing Restrictions: No      Mobility Bed Mobility Overal bed mobility: Modified Independent             General bed mobility comments: increased time, but no physical assist for into/out of bed    Transfers Overall transfer level: Needs assistance Equipment used: 1 person hand held assist Transfers: Sit to/from Stand Sit to Stand: Min guard           General transfer comment: cues for safety, not gross LOB, but noted to be somewhat shakey.      Balance Overall balance assessment: Mild deficits observed, not formally tested                                         ADL either performed or assessed with clinical judgement   ADL  General ADL Comments: SETUP for seated UB ADLs, MIN A For seated LB ADLs, CGA for ADL transfers with HHA     Vision   Additional Comments: difficult to formally assess d/t cognition, noted to have moderately dilated pupils throughout. Able to track appropriately.     Perception     Praxis      Pertinent Vitals/Pain Pain Assessment: Faces Faces Pain Scale: No hurt     Hand Dominance Right   Extremity/Trunk Assessment Upper Extremity Assessment Upper Extremity Assessment: Overall WFL for tasks assessed;Difficult to assess due to impaired cognition   Lower Extremity Assessment Lower Extremity Assessment: Generalized weakness;Difficult to assess due to impaired cognition        Communication Communication Communication: Other (comment) (slow cadence, tangential)   Cognition Arousal/Alertness: Awake/alert Behavior During Therapy: WFL for tasks assessed/performed Overall Cognitive Status: History of cognitive impairments - at baseline Area of Impairment: Attention;Orientation;Following commands;Safety/judgement;Problem solving                   Current Attention Level: Selective   Following Commands: Follows one step commands with increased time Safety/Judgement: Decreased awareness of deficits   Problem Solving: Slow processing;Requires verbal cues;Requires tactile cues General Comments: appropriate with most simple commands, very tangential speech     General Comments  noted some impulsivity and some sway/shakiness with fxl mobility, benefits from UE support    Exercises Other Exercises Other Exercises: OT engages pt in safety ed including fall prevention considerations and use of call light for further distances as he is noted to have some sway with fxl mobility/standing   Shoulder Instructions      Home Living Family/patient expects to be discharged to:: Assisted living ([facility])                             Home Equipment: Rolling Walker (2 wheels)   Additional Comments: ramped entrance      Prior Functioning/Environment Prior Level of Function : Needs assist       Physical Assist : ADLs (physical);Mobility (physical) Mobility (physical): Gait ADLs (physical): IADLs Mobility Comments: Pt reports primarily being in w/c at facility (per staff making him), but reports he walks with walker  and sometimes with nothing Unsure of efficacy. ADLs Comments: facility helps with meals, and transporation to appointments. Pt reports that he can bathe and dress himself. Unsure of efficacy as pt is not oriented.        OT Problem List: Decreased strength;Decreased activity tolerance;Impaired balance (sitting and/or  standing)      OT Treatment/Interventions: Self-care/ADL training;Therapeutic exercise;DME and/or AE instruction;Therapeutic activities;Balance training    OT Goals(Current goals can be found in the care plan section) Acute Rehab OT Goals Patient Stated Goal: none stated OT Goal Formulation: With patient Time For Goal Achievement: 06/07/21 Potential to Achieve Goals: Good ADL Goals Pt Will Perform Lower Body Dressing: with supervision Pt Will Transfer to Toilet: with supervision Pt Will Perform Toileting - Clothing Manipulation and hygiene: with supervision  OT Frequency: Min 2X/week   Barriers to D/C:            Co-evaluation              AM-PAC OT "6 Clicks" Daily Activity     Outcome Measure Help from another person eating meals?: None Help from another person taking care of personal grooming?: A Little Help from another person toileting, which includes using toliet, bedpan, or urinal?: A  Little Help from another person bathing (including washing, rinsing, drying)?: A Little Help from another person to put on and taking off regular upper body clothing?: None Help from another person to put on and taking off regular lower body clothing?: A Little 6 Click Score: 20   End of Session Equipment Utilized During Treatment: Gait belt;Rolling walker (2 wheels) Nurse Communication: Mobility status  Activity Tolerance: Patient tolerated treatment well Patient left: in bed;with call bell/phone within reach;with bed alarm set  OT Visit Diagnosis: Unsteadiness on feet (R26.81);Muscle weakness (generalized) (M62.81)                Time: 1610-9604 OT Time Calculation (min): 18 min Charges:  OT General Charges $OT Visit: 1 Visit OT Evaluation $OT Eval Low Complexity: 1 Low OT Treatments $Self Care/Home Management : 8-22 mins  Rejeana Brock, MS, OTR/L ascom [phone] 05/24/21, 3:45 PM

## 2021-05-25 DIAGNOSIS — G9341 Metabolic encephalopathy: Secondary | ICD-10-CM

## 2021-05-25 DIAGNOSIS — J441 Chronic obstructive pulmonary disease with (acute) exacerbation: Secondary | ICD-10-CM | POA: Diagnosis not present

## 2021-05-25 DIAGNOSIS — F1990 Other psychoactive substance use, unspecified, uncomplicated: Secondary | ICD-10-CM | POA: Diagnosis not present

## 2021-05-25 LAB — CBC
HCT: 34.3 % — ABNORMAL LOW (ref 39.0–52.0)
Hemoglobin: 11.2 g/dL — ABNORMAL LOW (ref 13.0–17.0)
MCH: 29.8 pg (ref 26.0–34.0)
MCHC: 32.7 g/dL (ref 30.0–36.0)
MCV: 91.2 fL (ref 80.0–100.0)
Platelets: 188 10*3/uL (ref 150–400)
RBC: 3.76 MIL/uL — ABNORMAL LOW (ref 4.22–5.81)
RDW: 15.5 % (ref 11.5–15.5)
WBC: 10.9 10*3/uL — ABNORMAL HIGH (ref 4.0–10.5)
nRBC: 0 % (ref 0.0–0.2)

## 2021-05-25 LAB — BASIC METABOLIC PANEL
Anion gap: 6 (ref 5–15)
BUN: 32 mg/dL — ABNORMAL HIGH (ref 8–23)
CO2: 29 mmol/L (ref 22–32)
Calcium: 9.5 mg/dL (ref 8.9–10.3)
Chloride: 102 mmol/L (ref 98–111)
Creatinine, Ser: 1.32 mg/dL — ABNORMAL HIGH (ref 0.61–1.24)
GFR, Estimated: >60 mL/min (ref >60)
Glucose, Bld: 105 mg/dL — ABNORMAL HIGH (ref 70–99)
Potassium: 4.2 mmol/L (ref 3.5–5.1)
Sodium: 137 mmol/L (ref 135–145)

## 2021-05-25 MED ORDER — ADULT MULTIVITAMIN W/MINERALS CH
1.0000 | ORAL_TABLET | Freq: Every day | ORAL | Status: DC
  Administered 2021-05-26: 1 via ORAL
  Filled 2021-05-25: qty 1

## 2021-05-25 MED ORDER — ENSURE ENLIVE PO LIQD
237.0000 mL | Freq: Two times a day (BID) | ORAL | Status: DC
  Administered 2021-05-26: 237 mL via ORAL

## 2021-05-25 NOTE — Progress Notes (Signed)
PROGRESS NOTE   HPI was taken from Dr. Para March: Carl [name] is a 60 y.o. male with medical history significant for pAF on Eliquis, CAD s/p CABG, systolic CHF [EF 96-04% 08/2020] ischemic cardiomyopathy s/p AICD, COPD, HTN, stroke, bipolar mood disorder, resident of SNF who was initially sent to the ED for evaluation of a fall but who developed acute wheezing and shortness of breath during his extended wait while in the ED.  Patient was hospitalized twice in 2021 for COPD exacerbation.  He denied chest pain, cough, fever or chills.  He endorses sacral pain from his fall and while he hit his head when he did fall denies headache.   ED course: On arrival, vitals within normal limits CBC and BMP unremarkable COVID and flu negative   EKG, personally viewed and interpreted: Paced rhythm at 77 with no acute ST-T wave changes   Imaging: Extensive imaging including CT head and x-rays of chest, pelvis, sacrum, left hand and pelvis with no evidence of acute trauma.AAA on lumbar x ray   Patient treated with duo nebs x3, prednisone, and given hydrocodone for pain.  Hospitalist consulted for admission.    As per Dr. Sharl Ma: 60 year old male with medical history of paroxysmal atrial fibrillation on Eliquis, CAD s/p CABG, systolic CHF with EF 30 to 35%, ischemic cardiomyopathy, s/p AICD, COPD, hypertension, stroke, bipolar mood disorder, resident of skilled nursing facility was sent to the ED for evaluation of fall but developed acute wheezing and shortness of breath while waiting in the ED.  Patient was hospitalized in 2021 for COPD exacerbation.  Denies chest pain, cough, fever or chills. In the ED extensive imaging was negative for acute trauma   Patient treated with duo nebs x3 prednisone in the ED  As per Dr. Mayford Knife 11/16 & 05/25/21: Pt still has course breath sounds and wheezing b/l. Pt will continue on bronchodilators, steroids, doxycycline & incentive spirometry. Pt can likely be d/c tomorrow if  respiratory status has improved. Of note, pt's drug screen was positive for MDMA, opiates, TCAs.   Carl [name]  VWU:[id number] DOB: 20-Feb-1961 DOA: 05/23/2021 PCP: [facility]   Assessment & Plan:   Principal Problem:   COPD with acute exacerbation (HCC) Active Problems:   Bipolar 1 disorder (HCC)   Coronary artery disease   Essential hypertension   History of stroke   Hypothyroidism   AICD (automatic cardioverter/defibrillator) present   Ischemic cardiomyopathy   Chronic systolic heart failure (HCC)   PAF (paroxysmal atrial fibrillation) (HCC)   Chronic anticoagulation   Abdominal aortic aneurysm (AAA) 35 to 39 mm in diameter  Possible COPD exacerbation: still w/ course breath sounds & wheezing b/l. Continue on steroids, doxycycline, bronchodilators & encourage incentive spirometry   Fall at home: PT/OT recs HH. Fall precautions    Hx of CAD: continue on coreg, lisinopril, statin. Pt denies any chest pain    Chronic systolic heart failure : w/ hx of ischemic cardiomyopathy s/p AICD. Continue on coreg, statin, lisinoprol    Bipolar disorder: unknown type or severity. Continue on depakote, seroquel  Suicidal ideations: does not inpatient psych criteria as per psych  Illicit drug use: urine drug screen was positive for MDMA, opiates & TCAs.   Encephalopathy: possibly metabolic vs toxic. CT head shows no acute intracranial findings. Urine cx shows no growth. Urine drug screen was positive for TCAs, opiates & MDMA. Oriented to self and place ony    Seizure disorder: continue on keppra    HTN: continue on lisinopril,  coreg    Hx of CVA: continue on statin, eliquis    Hypothyroidism: continue on levothyroxine    PAF: continue on coreg, eliquis    Infrarenal AAA :on lumbar xray. Will need outpatient f/u     DVT prophylaxis: eliquis  Code Status: full  Family Communication:  Disposition Plan: back to group home   Level of care: Med-Surg  Status is:  Observation  The patient remains OBS appropriate and will d/c before 2 midnights.       Consultants:  Psych   Procedures:   Antimicrobials: doxycycline   Subjective: Pt denies any complaints. Pt is only oriented to person and place   Objective: Vitals:   05/24/21 2125 05/25/21 0009 05/25/21 0244 05/25/21 0316  BP:  103/60  (!) 100/56  Pulse:  72  66  Resp:  17  17  Temp:  98.2 F (36.8 C)  98.5 F (36.9 C)  TempSrc:      SpO2: 95% 96% 97% 95%    Intake/Output Summary (Last 24 hours) at 05/25/2021 0738 Last data filed at 05/24/2021 2211 Gross per 24 hour  Intake --  Output 1300 ml  Net -1300 ml   There were no vitals filed for this visit.  Examination:  General exam: Appears comfortable and confused Respiratory system: course breath sounds b/l. Wheezes b/l  Cardiovascular system: S1/S2+. No rubs or clicks  Gastrointestinal system: Abd is soft, NT, ND & normal bowel sounds Central nervous system: Alert and awake. Moves all extremities  Psychiatry: Judgement and insight appear poor. Flat mood and affect    Data Reviewed: I have personally reviewed following labs and imaging studies  CBC: Recent Labs  Lab 05/22/21 1333 05/24/21 0616 05/25/21 0549  WBC 8.5 10.9* 10.9*  HGB 13.1 11.4* 11.2*  HCT 38.8* 34.4* 34.3*  MCV 92.4 92.2 91.2  PLT 172 166 188   Basic Metabolic Panel: Recent Labs  Lab 05/22/21 1333 05/24/21 0616 05/25/21 0549  NA 135 137 137  K 3.6 3.7 4.2  CL 102 101 102  CO2 25 27 29   GLUCOSE 101* 99 105*  BUN 15 25* 32*  CREATININE 1.18 1.07 1.32*  CALCIUM 8.5* 9.4 9.5   GFR: CrCl cannot be calculated (Unknown ideal weight.). Liver Function Tests: Recent Labs  Lab 05/24/21 0616  AST 25  ALT 16  ALKPHOS 34*  BILITOT 0.6  PROT 6.3*  ALBUMIN 3.0*   No results for input(s): LIPASE, AMYLASE in the last 168 hours. No results for input(s): AMMONIA in the last 168 hours. Coagulation Profile: No results for input(s): INR,  PROTIME in the last 168 hours. Cardiac Enzymes: No results for input(s): CKTOTAL, CKMB, CKMBINDEX, TROPONINI in the last 168 hours. BNP (last 3 results) No results for input(s): PROBNP in the last 8760 hours. HbA1C: No results for input(s): HGBA1C in the last 72 hours. CBG: No results for input(s): GLUCAP in the last 168 hours. Lipid Profile: No results for input(s): CHOL, HDL, LDLCALC, TRIG, CHOLHDL, LDLDIRECT in the last 72 hours. Thyroid Function Tests: No results for input(s): TSH, T4TOTAL, FREET4, T3FREE, THYROIDAB in the last 72 hours. Anemia Panel: No results for input(s): VITAMINB12, FOLATE, FERRITIN, TIBC, IRON, RETICCTPCT in the last 72 hours. Sepsis Labs: No results for input(s): PROCALCITON, LATICACIDVEN in the last 168 hours.  Recent Results (from the past 240 hour(s))  Resp Panel by RT-PCR (Flu A&B, Covid) Nasopharyngeal Swab     Status: None   Collection Time: 05/23/21 12:18 AM   Specimen: Nasopharyngeal Swab;  Nasopharyngeal(NP) swabs in vial transport medium  Result Value Ref Range Status   SARS Coronavirus 2 by RT PCR NEGATIVE NEGATIVE Final    Comment: (NOTE) SARS-CoV-2 target nucleic acids are NOT DETECTED.  The SARS-CoV-2 RNA is generally detectable in upper respiratory specimens during the acute phase of infection. The lowest concentration of SARS-CoV-2 viral copies this assay can detect is 138 copies/mL. A negative result does not preclude SARS-Cov-2 infection and should not be used as the sole basis for treatment or other patient management decisions. A negative result may occur with  improper specimen collection/handling, submission of specimen other than nasopharyngeal swab, presence of viral mutation(s) within the areas targeted by this assay, and inadequate number of viral copies(<138 copies/mL). A negative result must be combined with clinical observations, patient history, and epidemiological information. The expected result is Negative.  Fact  Sheet for Patients:  BloggerCourse.com  Fact Sheet for Healthcare Providers:  SeriousBroker.it  This test is no t yet approved or cleared by the [place] FDA and  has been authorized for detection and/or diagnosis of SARS-CoV-2 by FDA under an Emergency Use Authorization (EUA). This EUA will remain  in effect (meaning this test can be used) for the duration of the COVID-19 declaration under Section 564(b)(1) of the Act, 21 U.S.C.section 360bbb-3(b)(1), unless the authorization is terminated  or revoked sooner.       Influenza A by PCR NEGATIVE NEGATIVE Final   Influenza B by PCR NEGATIVE NEGATIVE Final    Comment: (NOTE) The Xpert Xpress SARS-CoV-2/FLU/RSV plus assay is intended as an aid in the diagnosis of influenza from Nasopharyngeal swab specimens and should not be used as a sole basis for treatment. Nasal washings and aspirates are unacceptable for Xpert Xpress SARS-CoV-2/FLU/RSV testing.  Fact Sheet for Patients: BloggerCourse.com  Fact Sheet for Healthcare Providers: SeriousBroker.it  This test is not yet approved or cleared by the [place] FDA and has been authorized for detection and/or diagnosis of SARS-CoV-2 by FDA under an Emergency Use Authorization (EUA). This EUA will remain in effect (meaning this test can be used) for the duration of the COVID-19 declaration under Section 564(b)(1) of the Act, 21 U.S.C. section 360bbb-3(b)(1), unless the authorization is terminated or revoked.  Performed at [facility], [address], [place], [place] [address]   Urine Culture     Status: None   Collection Time: 05/23/21  5:20 PM   Specimen: Urine, Clean Catch  Result Value Ref Range Status   Specimen Description   Final    URINE, CLEAN CATCH Performed at [facility], [address] [place], [place] [address]    Special Requests   Final     NONE Performed at [facility], [address], [place], [place] [address]    Culture   Final    NO GROWTH Performed at [facility], [address] [place], [place] [address]    Report Status 05/24/2021 FINAL  Final         Radiology Studies: No results found.      Scheduled Meds:  apixaban  5 mg Oral BID   atorvastatin  40 mg Oral Daily   carvedilol  12.5 mg Oral BID WC   divalproex  500 mg Oral BID   doxycycline  100 mg Oral Q12H   escitalopram  10 mg Oral QHS   fenofibrate  160 mg Oral Daily   furosemide  40 mg Oral Daily   ipratropium-albuterol  3 mL Nebulization  Q6H   levETIRAcetam  250 mg Oral BID   levothyroxine  25 mcg Oral Daily   lisinopril  2.5 mg Oral Daily   mometasone-formoterol  2 puff Inhalation BID   pantoprazole  40 mg Oral Daily   predniSONE  40 mg Oral Q breakfast   QUEtiapine  100 mg Oral QHS   QUEtiapine  50 mg Oral Q breakfast   Continuous Infusions:   LOS: 0 days    Time spent: 30 mins     Charise Killian, MD [facility] Pager [phone]  If 7PM-7AM, please contact night-coverage 05/25/2021, 7:38 AM

## 2021-05-25 NOTE — TOC Progression Note (Addendum)
Transition of Care [facility]) - Progression Note    Patient Details  Name: Carl [name] MRN: [id number] Date of Birth: 1961/06/22  Transition of Care [facility]) CM/SW Contact  Marlowe Sax, RN Phone Number: 05/25/2021, 10:22 AM  Clinical Narrative:   The patient is a resident of the [name],  Attempted to call [place] DSS who is listed as the Legal guardian, Received a VM for Kiley Marrow at DSS, left a secure VM for a call back and provided the contact phone number, Attempted to reach the owner of [facility] to update on the patient and make aware of plan to DC over there weekend, Her name is Dagmar Hait and she is not available due to being out for an appointment, Was told that they would have her call me back, I provided the phone number to call.   The patient is able to answer questions about his name and Dob, He knows that he lives at the [facility], He tells me that he walks with a Walker and it is silver, he stated that the manager of the facility tells him to use a Wheelcahir so he does not fall but he gets up with his walker anyway.  He curses a lot and when talking about the facility and the manager he stated that they make him behave and do what they say.  He stated that he does what he wants anyways.    I reached out to  [facility], [facility], [facility], [facility], and [facility], as well as [facility] and [facility], to see if He can get [facility] PT, and possibly Nursing for COPD  [facility] in not in Network, [facility] is unable to accept,  [facility] for [facility] services however the facility has [facility] as their preferred, [facility] did accept the patient, I notified Adelina Mings at [facility] to cancel the referral   Update, Dagmar Hait from [facility] called back and I let her know we anticipate DC over the weekend, She stated to call her for transport at [phone]       Expected Discharge Plan and Services                                                  Social Determinants of Health (SDOH) Interventions    Readmission Risk Interventions Readmission Risk Prevention Plan 11/03/2018  Transportation Screening Complete  PCP or Specialist Appt within 5-7 Days Not Complete  Not Complete comments still in ICU  Home Care Screening Complete  Medication Review (RN CM) Complete

## 2021-05-25 NOTE — Progress Notes (Signed)
Initial Nutrition Assessment  DOCUMENTATION CODES:   Not applicable  INTERVENTION:   -Magic cup BID with meals, each supplement provides 290 kcal and 9 grams of protein  -MVI with minerals daily -Liberalize diet to regular  NUTRITION DIAGNOSIS:   Increased nutrient needs related to chronic illness (COPD) as evidenced by estimated needs.  GOAL:   Patient will meet greater than or equal to 90% of their needs  MONITOR:   PO intake, Supplement acceptance, Labs, Weight trends, Skin, I & O's  REASON FOR ASSESSMENT:   Malnutrition Screening Tool    ASSESSMENT:   Carl [name] is a 60 y.o. male with medical history significant for pAF on Eliquis, CAD s/p CABG, systolic CHF (EF 61-44% 08/2020) ischemic cardiomyopathy s/p AICD, COPD, HTN, stroke, bipolar mood disorder, resident of SNF who was initially sent to the ED for evaluation of a fall but who developed acute wheezing and shortness of breath during his extended wait while in the ED.  Patient was hospitalized twice in 2021 for COPD exacerbation.  He denied chest pain, cough, fever or chills.  He endorses sacral pain from his fall and while he hit his head when he did fall denies headache.  Pt admitted with COPD exacerbation.    Reviewed I/O's: -1.3 L x 24 hours and -1.1 L since admission  UOP: 1.3 L x 24 hours  Spoke with pt at bedside, who reports feeling "okay" today. He very tangential at time of visit and expresses dissatisfaction with his legal guardian and his inability to make medical decisions for himself. He reports fair appetite, consuming 100% of breakfast meals, but upset that he was unable to ger bacon.   PTA, pt reports good appetite and shares he usually eats well. He consumes 3 meals per day and is a given a choice of two entrees. He is requesting a "20 foot pizza with pepperoni and all the meats and toppings". He resides at [facility].   Pt unsure of UBW or weight loss. Reviewed wt hx; pt has experienced a  6.5% wt loss over the past 11 months, which is not significant for time frame.   Discussed importance of good meal and supplement intake to promote healing. Pt with multiple missing teeth, but denies difficulty chewing foods.   Medications reviewed and include lasix and prednisone.   Labs reviewed.   NUTRITION - FOCUSED PHYSICAL EXAM:  Flowsheet Row Most Recent Value  Orbital Region No depletion  Upper Arm Region No depletion  Thoracic and Lumbar Region No depletion  Buccal Region No depletion  Temple Region No depletion  Clavicle Bone Region No depletion  Clavicle and Acromion Bone Region No depletion  Scapular Bone Region No depletion  Dorsal Hand No depletion  Patellar Region No depletion  Anterior Thigh Region No depletion  Posterior Calf Region No depletion  Edema (RD Assessment) None  Hair Reviewed  Eyes Reviewed  Mouth Reviewed  Skin Reviewed  Nails Reviewed       Diet Order:   Diet Order             Diet Heart Room service appropriate? Yes; Fluid consistency: Thin  Diet effective now                   EDUCATION NEEDS:   Education needs have been addressed  Skin:  Skin Assessment: Reviewed RN Assessment  Last BM:  Unknown  Height:   Ht Readings from Last 1 Encounters:  03/15/21 5\' 11"  (1.803 m)  Weight:   Wt Readings from Last 1 Encounters:  05/25/21 85.7 kg    Ideal Body Weight:  78.2 kg  BMI:  Body mass index is 26.35 kg/m.  Estimated Nutritional Needs:   Kcal:  2100-2300  Protein:  110-125 grams  Fluid:  > 2.1 L    Levada Schilling, RD, LDN, CDCES Registered Dietitian II Certified Diabetes Care and Education Specialist Please refer to [facility] for RD and/or RD on-call/weekend/after hours pager

## 2021-05-26 DIAGNOSIS — R531 Weakness: Secondary | ICD-10-CM | POA: Diagnosis not present

## 2021-05-26 DIAGNOSIS — I5022 Chronic systolic (congestive) heart failure: Secondary | ICD-10-CM

## 2021-05-26 DIAGNOSIS — F319 Bipolar disorder, unspecified: Secondary | ICD-10-CM | POA: Diagnosis not present

## 2021-05-26 DIAGNOSIS — E039 Hypothyroidism, unspecified: Secondary | ICD-10-CM

## 2021-05-26 DIAGNOSIS — I480 Paroxysmal atrial fibrillation: Secondary | ICD-10-CM

## 2021-05-26 DIAGNOSIS — I714 Abdominal aortic aneurysm, without rupture, unspecified: Secondary | ICD-10-CM | POA: Diagnosis not present

## 2021-05-26 DIAGNOSIS — J441 Chronic obstructive pulmonary disease with (acute) exacerbation: Secondary | ICD-10-CM | POA: Diagnosis not present

## 2021-05-26 LAB — BASIC METABOLIC PANEL
Anion gap: 7 (ref 5–15)
BUN: 48 mg/dL — ABNORMAL HIGH (ref 8–23)
CO2: 28 mmol/L (ref 22–32)
Calcium: 9.8 mg/dL (ref 8.9–10.3)
Chloride: 101 mmol/L (ref 98–111)
Creatinine, Ser: 1.28 mg/dL — ABNORMAL HIGH (ref 0.61–1.24)
GFR, Estimated: >60 mL/min (ref >60)
Glucose, Bld: 97 mg/dL (ref 70–99)
Potassium: 3.8 mmol/L (ref 3.5–5.1)
Sodium: 136 mmol/L (ref 135–145)

## 2021-05-26 LAB — CBC
HCT: 34.9 % — ABNORMAL LOW (ref 39.0–52.0)
Hemoglobin: 11.6 g/dL — ABNORMAL LOW (ref 13.0–17.0)
MCH: 29.9 pg (ref 26.0–34.0)
MCHC: 33.2 g/dL (ref 30.0–36.0)
MCV: 89.9 fL (ref 80.0–100.0)
Platelets: 216 10*3/uL (ref 150–400)
RBC: 3.88 MIL/uL — ABNORMAL LOW (ref 4.22–5.81)
RDW: 15.2 % (ref 11.5–15.5)
WBC: 11.1 10*3/uL — ABNORMAL HIGH (ref 4.0–10.5)
nRBC: 0 % (ref 0.0–0.2)

## 2021-05-26 MED ORDER — DOXYCYCLINE HYCLATE 100 MG PO TABS: 100.0000 mg | ORAL_TABLET | Freq: Two times a day (BID) | ORAL | 0 refills | Status: AC

## 2021-05-26 MED ORDER — CARVEDILOL 6.25 MG PO TABS: 6.2500 mg | ORAL_TABLET | Freq: Two times a day (BID) | ORAL | 0 refills | Status: DC

## 2021-05-26 MED ORDER — ENSURE ENLIVE PO LIQD: 237.0000 mL | Freq: Two times a day (BID) | ORAL | 0 refills | Status: DC

## 2021-05-26 MED ORDER — SODIUM CHLORIDE 0.9 % IV BOLUS
250.0000 mL | Freq: Once | INTRAVENOUS | Status: AC
  Administered 2021-05-26: 250 mL via INTRAVENOUS

## 2021-05-26 MED ORDER — CARVEDILOL 3.125 MG PO TABS: 6.2500 mg | ORAL_TABLET | Freq: Two times a day (BID) | ORAL | Status: DC

## 2021-05-26 MED ORDER — FLUTICASONE-SALMETEROL 250-50 MCG/DOSE IN AEPB: 1.0000 | INHALATION_SPRAY | Freq: Two times a day (BID) | RESPIRATORY_TRACT | 0 refills | Status: DC

## 2021-05-26 MED ORDER — PREDNISONE 20 MG PO TABS: ORAL_TABLET | ORAL | 0 refills | Status: DC

## 2021-05-26 NOTE — NC FL2 (Signed)
[place] LEVEL OF CARE SCREENING TOOL     IDENTIFICATION  Patient Name: Carl [name] Birthdate: 06/26/1961 Sex: male Admission Date (Current Location): 05/23/2021  [facility] and [place] Number:  Engineering geologist and Address:  [facility], [address] [place], [place] [address]      Provider Number: [id number]  Attending Physician Name and Address:  Loletha Grayer, MD  Relative Name and Phone Number:  DSS, [place] [employer] (Legal Guardian)   [phone] (Home Phone)    Current Level of Care: Hospital Recommended Level of Care: Assisted Living Facility Prior Approval Number:    Date Approved/Denied:   PASRR Number:    Discharge Plan:      Current Diagnoses: Patient Active Problem List   Diagnosis Date Noted   Weakness    COPD with acute exacerbation ([place]) 05/23/2021   PAF (paroxysmal atrial fibrillation) ([place]) 05/23/2021   Chronic anticoagulation 05/23/2021   Abdominal aortic aneurysm (AAA) 35 to 39 mm in diameter 05/23/2021   Ischemic cardiomyopathy [id number]   Chronic systolic heart failure ([place]) 05/22/2020   HCAP (healthcare-associated pneumonia) 01/24/2020   Hyperkalemia 01/24/2020   Acute on chronic systolic (congestive) heart failure ([place]) 01/24/2020   Seizure ([place]) 01/24/2020   HLD (hyperlipidemia) 01/24/2020   Ascites due to alcoholic cirrhosis (HCC)    Acute on chronic systolic CHF (congestive heart failure) ([place]) 11/18/2019   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/18/2019   Elevated troponin 11/18/2019   Hypoxia 11/18/2019   Acute renal failure superimposed on stage 3a chronic kidney disease ([place]) 11/18/2019   Coronary artery disease 05/15/2019   Essential hypertension 05/15/2019   History of stroke 05/15/2019   Homicidal ideation 05/15/2019   Hypothyroidism 05/15/2019   Constipation 05/06/2019   Scrotal mass 05/06/2019   Bipolar 1 disorder ([place]) 12/23/2018   Acute on chronic respiratory  failure with hypoxia ([place]) 11/03/2018   Chronic pain of left knee 09/17/2018   Atypical chest pain 10/11/2017   Polypharmacy 08/05/2017   Cerebrovascular accident (CVA) due to stenosis of left middle cerebral artery ([place]) 06/26/2017   Urinary retention due to benign prostatic hyperplasia 06/22/2017   Falls 06/20/2017   Risk for falls 02/19/2017   Dyslipidemia 11/19/2016   Peripheral artery disease ([place]) 08/28/2016   Claudication ([place]) 08/09/2016   History of alcohol abuse 08/09/2016   History of drug dependence/abuse ([place]) 08/09/2016   Severe single current episode of major depressive disorder, without psychotic features ([place]) 08/09/2016   Mitral insufficiency 01/04/2016   Prediabetes 12/29/2015   Fracture of left clavicle 07/13/2012    Orientation RESPIRATION BLADDER Height & Weight     Self, Time, Situation, Place  Normal Continent Weight: 188 lb 15.3 oz (85.7 kg) Height:     BEHAVIORAL SYMPTOMS/MOOD NEUROLOGICAL BOWEL NUTRITION STATUS      Continent Diet (regular)  AMBULATORY STATUS COMMUNICATION OF NEEDS Skin   Supervision Verbally Normal                       Personal Care Assistance Level of Assistance  Bathing, Feeding, Dressing Bathing Assistance: Limited assistance Feeding assistance: Independent Dressing Assistance: Limited assistance     Functional Limitations Info             SPECIAL CARE FACTORS FREQUENCY  PT (By licensed PT), OT (By licensed OT)     PT Frequency: home health OT Frequency: home health            Contractures      Additional  Factors Info  Code Status, Allergies Code Status Info: full Allergies Info: Iodinated Diagnostic Agents, Penicillins, Strawberry Extract, Cefepime, Erythromycin, Sulfa Antibiotics           Medication List       TAKE these medications     acetaminophen 500 MG tablet Commonly known as: TYLENOL Take 500 mg by mouth every 6 (six) hours as needed for mild pain.    albuterol 108 (90 Base)  MCG/ACT inhaler Commonly known as: VENTOLIN HFA Inhale 2 puffs into the lungs every 6 (six) hours as needed for wheezing.    carvedilol 6.25 MG tablet Commonly known as: COREG Take 1 tablet (6.25 mg total) by mouth 2 (two) times daily with a meal. What changed:  medication strength See the new instructions.    divalproex 500 MG 24 hr tablet Commonly known as: DEPAKOTE ER Take 1 tablet (500 mg total) by mouth 2 (two) times daily.    doxycycline 100 MG tablet Commonly known as: VIBRA-TABS Take 1 tablet (100 mg total) by mouth every 12 (twelve) hours for 5 days.    Eliquis 5 MG Tabs tablet Generic drug: apixaban TAKE 1 TABLET BY MOUTH TWICE A DAY    escitalopram 10 MG tablet Commonly known as: LEXAPRO Take 1 tablet (10 mg total) by mouth at bedtime.    feeding supplement Liqd Take 237 mLs by mouth 2 (two) times daily between meals.    fenofibrate 160 MG tablet Take 160 mg by mouth daily.    Fluticasone-Salmeterol 250-50 MCG/DOSE Aepb Commonly known as: ADVAIR Inhale 1 puff into the lungs 2 (two) times daily.    folic acid 1 MG tablet Commonly known as: FOLVITE Take 1 mg by mouth daily.    furosemide 40 MG tablet Commonly known as: LASIX Take 1 tablet (40 mg total) by mouth daily.    levETIRAcetam 250 MG tablet Commonly known as: Keppra Take 1 tablet (250 mg total) by mouth 2 (two) times daily.    levothyroxine 25 MCG tablet Commonly known as: SYNTHROID Take 1 tablet (25 mcg total) by mouth daily.    lisinopril 2.5 MG tablet Commonly known as: ZESTRIL TAKE 1 TABLET BY MOUTH ONCE DAILY    omeprazole 20 MG capsule Commonly known as: PRILOSEC Take 20 mg by mouth daily.    predniSONE 20 MG tablet Commonly known as: DELTASONE Two tabs po daily for thee days Start taking on: May 27, 2021    QUEtiapine 100 MG tablet Commonly known as: SEROQUEL Take 200 mg by mouth at bedtime.    QUEtiapine 50 MG tablet Commonly known as: SEROQUEL Take 50 mg by mouth  in the morning.    simvastatin 80 MG tablet Commonly known as: ZOCOR Take 80 mg by mouth daily.    tiotropium 18 MCG inhalation capsule Commonly known as: SPIRIVA Place 18 mcg into inhaler and inhale daily.     Relevant Imaging Results:  Relevant Lab Results:   Additional Information    Aaralyn Kil E Saryah Loper, LCSW

## 2021-05-26 NOTE — Discharge Summary (Signed)
[facility] [place] [facility]   PATIENT NAME: Carl [name]    MR#:  [id number]  DATE OF BIRTH:  04-04-1961  DATE OF ADMISSION:  05/23/2021 ADMITTING PHYSICIAN: Andris Baumann, MD  DATE OF DISCHARGE: 05/26/2021  PRIMARY CARE PHYSICIAN: [facility]    ADMISSION DIAGNOSIS:  COPD exacerbation (HCC) [J44.1] COPD with acute exacerbation (HCC) [J44.1] Fall, initial encounter [W19.XXXA]  DISCHARGE DIAGNOSIS:  Principal Problem:   COPD with acute exacerbation (HCC) Active Problems:   Bipolar 1 disorder (HCC)   Coronary artery disease   Essential hypertension   History of stroke   Hypothyroidism   AICD (automatic cardioverter/defibrillator) present   Ischemic cardiomyopathy   Chronic systolic heart failure (HCC)   PAF (paroxysmal atrial fibrillation) (HCC)   Chronic anticoagulation   Abdominal aortic aneurysm (AAA) 35 to 39 mm in diameter   SECONDARY DIAGNOSIS:   Past Medical History:  Diagnosis Date   Acute pulmonary edema (HCC) 2017   Acute respiratory failure with hypoxia (HCC) 2017   Bipolar 1 disorder (HCC)    CHF (congestive heart failure) (HCC)    Chronic kidney disease    COPD (chronic obstructive pulmonary disease) (HCC)    Depression    Hypertension    Myocardial infarction [facility])    NSTEMI (non-ST elevated myocardial infarction) (HCC)    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  Will be discharged on prednisone for 3 more days and continue doxycycline to completion.  Continue inhalers. Weakness and falls at home.  Physical therapy recommends home health PT.  Patient does have a wheelchair and would like to use it.  Group home will have the physical therapist evaluate his wheelchair on whether a new one is needed.  Will prescribe a walker. History of CAD on Coreg, lisinopril and  statin. Chronic systolic heart failure.  No signs of heart failure on this hospital stay.  Continue Coreg Lasix and lisinopril Bipolar disorder unspecified.  Continue Depakote and Seroquel.  Seen by psychiatry during the hospital course Urine drug screen positive for MDMA, opiates and TCA. Acute metabolic encephalopathy.  This has improved. Seizure disorder on Keppra Essential hypertension on lisinopril and Coreg History of CVA on Eliquis and statin Paroxysmal atrial fibrillation on Coreg and Eliquis Hypothyroidism unspecified on levothyroxine Infrarenal AAA on lumbar x-ray.  Will need outpatient ultrasound for continued monitoring.  Measured 3.5 cm on x-ray  DISCHARGE CONDITIONS:   Satisfactory  CONSULTS OBTAINED:    Psychiatry  DRUG ALLERGIES:   Allergies  Allergen Reactions   Iodinated Diagnostic Agents Shortness Of Breath   Penicillins Anaphylaxis and Shortness Of Breath    Respiratory  Tolerated cefuroxime on 62/1/17   Strawberry Extract Anaphylaxis   Cefepime Itching    Empiric antibiotic, developed pruritis.    Erythromycin Itching   Sulfa Antibiotics Itching, Nausea And Vomiting and Nausea Only    DISCHARGE MEDICATIONS:   Allergies as of 05/26/2021       Reactions   Iodinated Diagnostic Agents Shortness Of Breath   Penicillins Anaphylaxis, Shortness Of Breath   Respiratory  Tolerated cefuroxime on 62/1/17   Strawberry Extract Anaphylaxis   Cefepime Itching   Empiric antibiotic, developed pruritis.    Erythromycin Itching   Sulfa Antibiotics Itching, Nausea And Vomiting, Nausea Only        Medication List  TAKE these medications    acetaminophen 500 MG tablet Commonly known as: TYLENOL Take 500 mg by mouth every 6 (six) hours as needed for mild pain.   albuterol 108 (90 Base) MCG/ACT inhaler Commonly known as: VENTOLIN HFA Inhale 2 puffs into the lungs every 6 (six) hours as needed for wheezing.   carvedilol 6.25 MG tablet Commonly known as:  COREG Take 1 tablet (6.25 mg total) by mouth 2 (two) times daily with a meal. What changed:  medication strength See the new instructions.   divalproex 500 MG 24 hr tablet Commonly known as: DEPAKOTE ER Take 1 tablet (500 mg total) by mouth 2 (two) times daily.   doxycycline 100 MG tablet Commonly known as: VIBRA-TABS Take 1 tablet (100 mg total) by mouth every 12 (twelve) hours for 5 days.   Eliquis 5 MG Tabs tablet Generic drug: apixaban TAKE 1 TABLET BY MOUTH TWICE A DAY   escitalopram 10 MG tablet Commonly known as: LEXAPRO Take 1 tablet (10 mg total) by mouth at bedtime.   feeding supplement Liqd Take 237 mLs by mouth 2 (two) times daily between meals.   fenofibrate 160 MG tablet Take 160 mg by mouth daily.   Fluticasone-Salmeterol 250-50 MCG/DOSE Aepb Commonly known as: ADVAIR Inhale 1 puff into the lungs 2 (two) times daily.   folic acid 1 MG tablet Commonly known as: FOLVITE Take 1 mg by mouth daily.   furosemide 40 MG tablet Commonly known as: LASIX Take 1 tablet (40 mg total) by mouth daily.   levETIRAcetam 250 MG tablet Commonly known as: Keppra Take 1 tablet (250 mg total) by mouth 2 (two) times daily.   levothyroxine 25 MCG tablet Commonly known as: SYNTHROID Take 1 tablet (25 mcg total) by mouth daily.   lisinopril 2.5 MG tablet Commonly known as: ZESTRIL TAKE 1 TABLET BY MOUTH ONCE DAILY   omeprazole 20 MG capsule Commonly known as: PRILOSEC Take 20 mg by mouth daily.   predniSONE 20 MG tablet Commonly known as: DELTASONE Two tabs po daily for thee days Start taking on: May 27, 2021   QUEtiapine 100 MG tablet Commonly known as: SEROQUEL Take 200 mg by mouth at bedtime.   QUEtiapine 50 MG tablet Commonly known as: SEROQUEL Take 50 mg by mouth in the morning.   simvastatin 80 MG tablet Commonly known as: ZOCOR Take 80 mg by mouth daily.   tiotropium 18 MCG inhalation capsule Commonly known as: SPIRIVA Place 18 mcg into  inhaler and inhale daily.               Durable Medical Equipment  (From admission, onward)           Start     Ordered   05/26/21 0931  For home use only DME Walker rolling  Once       Question Answer Comment  Walker: With 5 Inch Wheels   Patient needs a walker to treat with the following condition Unsteady gait when walking      05/26/21 0930             DISCHARGE INSTRUCTIONS:  Follow-up PMD 5 days  If you experience worsening of your admission symptoms, develop shortness of breath, life threatening emergency, suicidal or homicidal thoughts you must seek medical attention immediately by calling 911 or calling your MD immediately  if symptoms less severe.  You Must read complete instructions/literature along with all the possible adverse reactions/side effects for all the Medicines you take and that have  been prescribed to you. Take any new Medicines after you have completely understood and accept all the possible adverse reactions/side effects.   Please note  You were cared for by a hospitalist during your hospital stay. If you have any questions about your discharge medications or the care you received while you were in the hospital after you are discharged, you can call the unit and asked to speak with the hospitalist on call if the hospitalist that took care of you is not available. Once you are discharged, your primary care physician will handle any further medical issues. Please note that NO REFILLS for any discharge medications will be authorized once you are discharged, as it is imperative that you return to your primary care physician (or establish a relationship with a primary care physician if you do not have one) for your aftercare needs so that they can reassess your need for medications and monitor your lab values.    Today   CHIEF COMPLAINT:   Chief Complaint  Patient presents with   Fall    HISTORY OF PRESENT ILLNESS:  Balthazar Dooly  is a 60  y.o. male came in after a fall and found to have COPD exacerbation   VITAL SIGNS:  Blood pressure 116/71, pulse 70, temperature 98.3 F (36.8 C), resp. rate 19, weight 85.7 kg, SpO2 97 %.  I/O:   Intake/Output Summary (Last 24 hours) at 05/26/2021 0948 Last data filed at 05/26/2021 0459 Gross per 24 hour  Intake 960 ml  Output 1250 ml  Net -290 ml    PHYSICAL EXAMINATION:  GENERAL:  60 y.o.-year-old patient lying in the bed with no acute distress.  EYES: Pupils equal, round, reactive to light and accommodation. No scleral icterus.  HEENT: Head atraumatic, normocephalic. Oropharynx and nasopharynx clear.  NECK:  Supple, no jugular venous distention. No thyroid enlargement, no tenderness.  LUNGS: Normal breath sounds bilaterally, no wheezing, rales,rhonchi or crepitation. No use of accessory muscles of respiration.  CARDIOVASCULAR: S1, S2 normal. No murmurs, rubs, or gallops.  ABDOMEN: Soft, non-tender, non-distended.  EXTREMITIES: No pedal edema.  NEUROLOGIC: Cranial nerves II through XII are intact. Muscle strength 5/5 in all extremities. Sensation intact. Gait not checked.  PSYCHIATRIC: The patient is alert and answers questions. SKIN: No obvious rash, lesion, or ulcer.   DATA REVIEW:   CBC Recent Labs  Lab 05/26/21 0439  WBC 11.1*  HGB 11.6*  HCT 34.9*  PLT 216    Chemistries  Recent Labs  Lab 05/24/21 0616 05/25/21 0549 05/26/21 0439  NA 137   < > 136  K 3.7   < > 3.8  CL 101   < > 101  CO2 27   < > 28  GLUCOSE 99   < > 97  BUN 25*   < > 48*  CREATININE 1.07   < > 1.28*  CALCIUM 9.4   < > 9.8  AST 25  --   --   ALT 16  --   --   ALKPHOS 34*  --   --   BILITOT 0.6  --   --    < > = values in this interval not displayed.    Microbiology Results  Results for orders placed or performed during the hospital encounter of 05/23/21  Resp Panel by RT-PCR (Flu A&B, Covid) Nasopharyngeal Swab     Status: None   Collection Time: 05/23/21 12:18 AM   Specimen:  Nasopharyngeal Swab; Nasopharyngeal(NP) swabs in vial transport medium  Result  Value Ref Range Status   SARS Coronavirus 2 by RT PCR NEGATIVE NEGATIVE Final    Comment: (NOTE) SARS-CoV-2 target nucleic acids are NOT DETECTED.  The SARS-CoV-2 RNA is generally detectable in upper respiratory specimens during the acute phase of infection. The lowest concentration of SARS-CoV-2 viral copies this assay can detect is 138 copies/mL. A negative result does not preclude SARS-Cov-2 infection and should not be used as the sole basis for treatment or other patient management decisions. A negative result may occur with  improper specimen collection/handling, submission of specimen other than nasopharyngeal swab, presence of viral mutation(s) within the areas targeted by this assay, and inadequate number of viral copies(<138 copies/mL). A negative result must be combined with clinical observations, patient history, and epidemiological information. The expected result is Negative.  Fact Sheet for Patients:  BloggerCourse.com  Fact Sheet for Healthcare Providers:  SeriousBroker.it  This test is no t yet approved or cleared by the [place] FDA and  has been authorized for detection and/or diagnosis of SARS-CoV-2 by FDA under an Emergency Use Authorization (EUA). This EUA will remain  in effect (meaning this test can be used) for the duration of the COVID-19 declaration under Section 564(b)(1) of the Act, 21 U.S.C.section 360bbb-3(b)(1), unless the authorization is terminated  or revoked sooner.       Influenza A by PCR NEGATIVE NEGATIVE Final   Influenza B by PCR NEGATIVE NEGATIVE Final    Comment: (NOTE) The Xpert Xpress SARS-CoV-2/FLU/RSV plus assay is intended as an aid in the diagnosis of influenza from Nasopharyngeal swab specimens and should not be used as a sole basis for treatment. Nasal washings and aspirates are unacceptable for  Xpert Xpress SARS-CoV-2/FLU/RSV testing.  Fact Sheet for Patients: BloggerCourse.com  Fact Sheet for Healthcare Providers: SeriousBroker.it  This test is not yet approved or cleared by the [place] FDA and has been authorized for detection and/or diagnosis of SARS-CoV-2 by FDA under an Emergency Use Authorization (EUA). This EUA will remain in effect (meaning this test can be used) for the duration of the COVID-19 declaration under Section 564(b)(1) of the Act, 21 U.S.C. section 360bbb-3(b)(1), unless the authorization is terminated or revoked.  Performed at [facility], [address], [place], [place] [address]   Urine Culture     Status: None   Collection Time: 05/23/21  5:20 PM   Specimen: Urine, Clean Catch  Result Value Ref Range Status   Specimen Description   Final    URINE, CLEAN CATCH Performed at [facility], [address] [place], [place] [address]    Special Requests   Final    NONE Performed at [facility], [address] [place], [place] [address]    Culture   Final    NO GROWTH Performed at [facility], [address] [place], [place] [address]    Report Status 05/24/2021 FINAL  Final     Management plans discussed with the patient.  CODE STATUS:     Code Status Orders  (From admission, onward)           Start     Ordered   05/23/21 0320  Full code  Continuous        05/23/21 0321           Code Status History     Date Active Date Inactive Code Status Order ID Comments User Context   05/05/2020 1336 05/06/2020 0006 Full Code [id number]  Duke Salvia, MD  Inpatient   01/24/2020 1426 01/27/2020 1948 Full Code [id number]  Lorretta Harp, MD Inpatient   11/18/2019 0437 11/22/2019 2044 Full Code [id number]  Andris Baumann, MD ED   05/14/2019 2247 05/24/2019 1355 Full Code [id number]  Clement Sayres, MD Inpatient   03/22/2019 0125 03/23/2019 1747 Full  Code [id number]  Delfino Lovett, MD Inpatient   11/03/2018 0352 11/04/2018 2115 Full Code [id number]  Mansy, Vernetta Honey, MD ED       TOTAL TIME TAKING CARE OF THIS PATIENT: 34 minutes.    Alford Highland M.D on 05/26/2021 at 9:48 AM   [facility]  CC: Primary care physician; [facility]

## 2021-05-26 NOTE — TOC Progression Note (Signed)
Transition of Care [facility]) - Progression Note    Patient Details  Name: Carl [name] MRN: [id number] Date of Birth: 1960-07-31  Transition of Care [facility]) CM/SW Contact  Liliana Cline, LCSW Phone Number: 05/26/2021, 9:24 AM  Clinical Narrative:   Patient medically ready to DC today. Called nonemergent line and requested call from on call DSS SW.    Expected Discharge Plan: Home w Home Health Services Barriers to Discharge: Continued Medical Work up  Expected Discharge Plan and Services Expected Discharge Plan: Home w Home Health Services   Discharge Planning Services: CM Consult Post Acute Care Choice: Home Health Living arrangements for the past 2 months: Assisted Living Facility                 DME Arranged: N/A         HH Arranged: PT, RN HH Agency: [facility] Date HH Agency Contacted: 05/25/21 Time HH Agency Contacted: 1116 Representative spoke with at [facility] Agency: [place]   Social Determinants of Health (SDOH) Interventions    Readmission Risk Interventions Readmission Risk Prevention Plan 11/03/2018  Transportation Screening Complete  PCP or Specialist Appt within 5-7 Days Not Complete  Not Complete comments still in ICU  Home Care Screening Complete  Medication Review (RN CM) Complete

## 2021-05-26 NOTE — TOC Transition Note (Addendum)
Transition of Care [facility]) - CM/SW Discharge Note   Patient Details  Name: Carl [name] MRN: [id number] Date of Birth: March 23, 1961  Transition of Care [facility]) CM/SW Contact:  Liliana Cline, LCSW Phone Number: 05/26/2021, 10:14 AM   Clinical Narrative:   Patient to DC back to [facility] today. Notified: Fleet Contras with [facility] DSS On Call (Guardian), [place] with [facility], and [place] with [facility].  Notified Fleet Contras that per RN patient was wanting to see about moving to another ALF. Fleet Contras reported she will notify patient's assigned DSS Worker. Confirmed pharmacy with Leanne Chang and that they will get patient's meds today.  Asked about if patient needs a new w/c, Leanne Chang stated she will have HHPT evaluate patient's current w/c and will reach out to PCP about getting a new one if needed. [facility] to pick up patient around 1pm per RN request. Leanne Chang to call 1A when she arrives. FL2 with DC meds from DC Summary has been placed in DC packet.    Final next level of care: Home w Home Health Services Barriers to Discharge: Barriers Resolved   Patient Goals and CMS Choice Patient states their goals for this hospitalization and ongoing recovery are:: home with home health CMS Medicare.gov Compare Post Acute Care list provided to:: Patient Represenative (must comment) Choice offered to / list presented to : [facility] POA / Guardian  Discharge Placement                Patient to be transferred to facility by: [facility]   Patient and family notified of of transfer: 05/26/21  Discharge Plan and Services   Discharge Planning Services: CM Consult Post Acute Care Choice: Home Health          DME Arranged: N/A         HH Arranged: PT, RN HH Agency: [facility] Date [facility] Agency Contacted: 05/26/21 Time HH Agency Contacted: 1116 Representative spoke with at [facility] Agency: [place]  Social Determinants of Health (SDOH) Interventions     Readmission Risk  Interventions Readmission Risk Prevention Plan 11/03/2018  Transportation Screening Complete  PCP or Specialist Appt within 5-7 Days Not Complete  Not Complete comments still in ICU  Home Care Screening Complete  Medication Review (RN CM) Complete

## 2021-08-03 ENCOUNTER — Ambulatory Visit (INDEPENDENT_AMBULATORY_CARE_PROVIDER_SITE_OTHER): Payer: Medicaid Other

## 2021-08-03 DIAGNOSIS — I255 Ischemic cardiomyopathy: Secondary | ICD-10-CM | POA: Diagnosis not present

## 2021-08-03 LAB — CUP PACEART REMOTE DEVICE CHECK
Battery Remaining Longevity: 81 mo
Battery Voltage: 2.99 V
Brady Statistic AP VP Percent: 47.14 %
Brady Statistic AP VS Percent: 0.95 %
Brady Statistic AS VP Percent: 49.8 %
Brady Statistic AS VS Percent: 2.11 %
Brady Statistic RA Percent Paced: 45.53 %
Brady Statistic RV Percent Paced: 15.24 %
Date Time Interrogation Session: 20230127022723
HighPow Impedance: 84 Ohm
Implantable Lead Implant Date: 20171201
Implantable Lead Implant Date: 20171201
Implantable Lead Implant Date: 20211029
Implantable Lead Location: 753858
Implantable Lead Location: 753859
Implantable Lead Location: 753860
Implantable Lead Model: 5076
Implantable Pulse Generator Implant Date: 20211029
Lead Channel Impedance Value: 232.653
Lead Channel Impedance Value: 241.412
Lead Channel Impedance Value: 246.635
Lead Channel Impedance Value: 250.943
Lead Channel Impedance Value: 261.164
Lead Channel Impedance Value: 456 Ohm
Lead Channel Impedance Value: 456 Ohm
Lead Channel Impedance Value: 475 Ohm
Lead Channel Impedance Value: 513 Ohm
Lead Channel Impedance Value: 532 Ohm
Lead Channel Impedance Value: 779 Ohm
Lead Channel Impedance Value: 779 Ohm
Lead Channel Impedance Value: 779 Ohm
Lead Channel Impedance Value: 779 Ohm
Lead Channel Impedance Value: 836 Ohm
Lead Channel Impedance Value: 836 Ohm
Lead Channel Impedance Value: 893 Ohm
Lead Channel Impedance Value: 893 Ohm
Lead Channel Pacing Threshold Amplitude: 0.5 V
Lead Channel Pacing Threshold Amplitude: 1.375 V
Lead Channel Pacing Threshold Amplitude: 2.5 V
Lead Channel Pacing Threshold Pulse Width: 0.4 ms
Lead Channel Pacing Threshold Pulse Width: 0.4 ms
Lead Channel Pacing Threshold Pulse Width: 0.4 ms
Lead Channel Sensing Intrinsic Amplitude: 0.75 mV
Lead Channel Sensing Intrinsic Amplitude: 0.75 mV
Lead Channel Sensing Intrinsic Amplitude: 18.25 mV
Lead Channel Sensing Intrinsic Amplitude: 18.25 mV
Lead Channel Setting Pacing Amplitude: 1.5 V
Lead Channel Setting Pacing Amplitude: 2 V
Lead Channel Setting Pacing Amplitude: 2.5 V
Lead Channel Setting Pacing Pulse Width: 0.4 ms
Lead Channel Setting Pacing Pulse Width: 0.8 ms
Lead Channel Setting Sensing Sensitivity: 0.3 mV

## 2021-08-13 NOTE — Progress Notes (Signed)
Remote ICD transmission.   

## 2021-08-21 ENCOUNTER — Encounter: Payer: Self-pay | Admitting: Internal Medicine

## 2021-08-21 ENCOUNTER — Other Ambulatory Visit: Payer: Self-pay

## 2021-08-21 ENCOUNTER — Ambulatory Visit (INDEPENDENT_AMBULATORY_CARE_PROVIDER_SITE_OTHER): Payer: Medicaid Other | Admitting: Internal Medicine

## 2021-08-21 VITALS — BP 88/58 | HR 66 | Ht 71.0 in | Wt 189.0 lb

## 2021-08-21 DIAGNOSIS — Z95810 Presence of automatic (implantable) cardiac defibrillator: Secondary | ICD-10-CM

## 2021-08-21 DIAGNOSIS — I5022 Chronic systolic (congestive) heart failure: Secondary | ICD-10-CM

## 2021-08-21 DIAGNOSIS — I255 Ischemic cardiomyopathy: Secondary | ICD-10-CM

## 2021-08-21 DIAGNOSIS — I480 Paroxysmal atrial fibrillation: Secondary | ICD-10-CM

## 2021-08-21 LAB — PACEMAKER DEVICE OBSERVATION

## 2021-08-21 NOTE — Progress Notes (Signed)
Patient Care Team: [facility] as PCP - General (Geriatric Medicine) Wellington Hampshire, MD as PCP - Cardiology (Cardiology) Deboraha Sprang, MD as PCP - Electrophysiology (Cardiology)  CC CHF  HPI  Carl [name] is a 61 y.o. male seen in follow-up for  CHF and atrial fibrillation with  dual-chamber ICD, intercurrent left bundle branch block and the issue raised at [facility] of CRT  Underwent CRT upgrade 10/21  (He is accompanied by [name])  The patient denies chest pain, nocturnal dyspnea, orthopnea or peripheral edema.  There have been no palpitations, lightheadedness or syncope.  Complains of some dyspnea.  As best as I could gather no lightheadedness..   No bleeding on the Eliquis  Previously on Entresto stopped because of weakness and hypotension          The state is his legal guardian.   DATE TEST EF    6/17 LHC    % LAD p-80, D1-80, CX m-80 RCA m >>DESx2  4/20 Echo   20-25% %    5/21 Echo  25-30%    6/21 Myoview 23% Antero/infero apical Defect fixed   7/21 Echo  25-30% MR mild-mod  2/22 Echo  30-35% LAE severe    Date Cr K Hgb  5/21 1.4<<1.02 4.5 14.2   10/21 1.47 4.7 14.4      Thromboembolic risk factors (, HTN-1, TIA/CVA-2, Vasc disease -1, CHF-1) for a CHADSVASc Score of .>=5  Records and Results Reviewed   Past Medical History:  Diagnosis Date   Acute pulmonary edema ([place]) 2017   Acute respiratory failure with hypoxia ([place]) 2017   Bipolar 1 disorder (HCC)    CHF (congestive heart failure) (HCC)    Chronic kidney disease    COPD (chronic obstructive pulmonary disease) ([place])    Depression    Hypertension    Myocardial infarction 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   albuterol (VENTOLIN HFA) 108 (90 Base) MCG/ACT inhaler Inhale 2 puffs into the lungs every 6 (six) hours as needed for wheezing.   apixaban (ELIQUIS) 5 MG TABS tablet TAKE 1 TABLET BY MOUTH TWICE A DAY   aspirin EC 81 MG tablet Take 81 mg by mouth daily.   carvedilol (COREG) 3.125 MG tablet Take 3.125 mg by mouth 2 (two) times daily with a meal.   clopidogrel (PLAVIX) 75 MG tablet Take 75 mg by mouth daily.   divalproex (DEPAKOTE ER) 500 MG 24 hr tablet Take 1 tablet (500 mg total) by mouth 2 (two) times daily.   escitalopram (LEXAPRO) 10 MG tablet Take 1 tablet (10 mg total) by mouth at bedtime.   feeding supplement (ENSURE ENLIVE / ENSURE PLUS) LIQD Take 237 mLs by mouth 2 (two) times daily between meals.   fenofibrate 160 MG tablet Take 160 mg by mouth daily.   folic  acid (FOLVITE) 1 MG tablet Take 1 mg by mouth daily.   furosemide (LASIX) 40 MG tablet Take 1 tablet (40 mg total) by mouth daily.   levETIRAcetam (KEPPRA) 250 MG tablet Take 1 tablet (250 mg total) by mouth 2 (two) times daily.   lisinopril (ZESTRIL) 2.5 MG tablet TAKE 1 TABLET BY MOUTH ONCE DAILY   omeprazole (PRILOSEC) 20 MG capsule Take 20 mg by mouth daily.   Potassium Chloride ER 20 MEQ TBCR Take 2 tablets by mouth daily.   QUEtiapine (SEROQUEL) 100 MG tablet Take 200 mg by mouth at bedtime.   QUEtiapine (SEROQUEL) 50 MG tablet Take 50 mg by mouth in the morning.   simvastatin (ZOCOR) 80 MG tablet Take 80 mg by mouth daily.   tiotropium (SPIRIVA) 18 MCG inhalation capsule Place 18 mcg into inhaler and inhale daily.    Allergies  Allergen Reactions   Iodinated Contrast Media Shortness Of Breath   Penicillins Anaphylaxis and  Shortness Of Breath    Respiratory  Tolerated cefuroxime on 01/06/16   Strawberry Extract Anaphylaxis   Cefepime Itching    Empiric antibiotic, developed pruritis.    Erythromycin Itching   Sulfa Antibiotics Itching, Nausea And Vomiting and Nausea Only      Review of Systems negative except from HPI and PMH  Physical Exam BP (!) 88/58 (BP Location: Left Arm, Patient Position: Sitting, Cuff Size: Normal)    Pulse 66    Ht 5\' 11"  (1.803 m)    Wt 189 lb (85.7 kg)    SpO2 94%    BMI 26.36 kg/m  Well developed and well nourished in no acute distress HENT normal Neck supple with JVP-flat Clear Device pocket well healed; without hematoma or erythema.  There is no tethering  Regular rate and rhythm, no  murmur Abd-soft with active BS No Clubbing cyanosis  edema Skin-warm and dry A is a 71 look at that was even better done at his totally upright in a biphasic grossly normal sensory and motor function  ECG AV pacing with a rS in V1 and a qR in lead I  Assessment and  Plan  Ischemic cardiomyopathy-'s s/p CABG plus mitral valve ring   Congestive heart failure-chronic-systolic-class IIb-IIIa   Implantable defibrillator-CRT-[employer]   Left bundle branch block   Atrial fibrillation-persistent  strokes-multiple   Bipolar disorder   Renal insufficiency chronic class 2 (GFR -65  2/22)  Volume status is stable.  We will continue him on Lasix 40 mg daily.  With his cardiomyopathy, we will continue him on carvedilol 3.125 twice daily lisinopril 2.5 daily.  His blood pressure precludes up titration surprisingly seems to be asymptomatic.  No interval atrial fibrillation over the last 6 months.  We will continue him on Eliquis at 5 mg twice daily.  We will discontinue his aspirin     Without symptoms of ischemia  Euvolemic continue current meds--uptitration of Guideline directed medical therapy limited by symptomatic hypotension.  No intercurrent atrial fibrillation or flutter, On  Anticoagulation;  No bleeding issues   Surprised that orthostatic VS were not more demonstrative, but BP runs low so will not augment meds for cardiomyopathy  Seeing psych actively--to the CMA made threats of suicide and killing the people who dont let him eat salt  This information was given to the accompanying health care provider who says these are recurrent and well known

## 2021-08-21 NOTE — Patient Instructions (Signed)
Medication Instructions:  Your physician has recommended you make the following change in your medication:  STOP ASPIRIN *If you need a refill on your cardiac medications before your next appointment, please call your pharmacy*   Lab Work: None ordered today If you have labs (blood work) drawn today and your tests are completely normal, you will receive your results only by: MyChart Message (if you have MyChart) OR A paper copy in the mail If you have any lab test that is abnormal or we need to change your treatment, we will call you to review the results.   Testing/Procedures: No testing done today.   Follow-Up: At [facility], you and your health needs are our priority.  As part of our continuing mission to provide you with exceptional heart care, we have created designated Provider Care Teams.  These Care Teams include your primary Cardiologist (physician) and Advanced Practice Providers (APPs -  Physician Assistants and Nurse Practitioners) who all work together to provide you with the care you need, when you need it.  We recommend signing up for the patient portal called "MyChart".  Sign up information is provided on this After Visit Summary.  MyChart is used to connect with patients for Virtual Visits (Telemedicine).  Patients are able to view lab/test results, encounter notes, upcoming appointments, etc.  Non-urgent messages can be sent to your provider as well.   To learn more about what you can do with MyChart, go to [email or web address].    Your next appointment:   1 year(s)  The format for your next appointment:   In Person  Provider:   [name], MD    Other Instructions

## 2021-09-20 ENCOUNTER — Ambulatory Visit (INDEPENDENT_AMBULATORY_CARE_PROVIDER_SITE_OTHER): Payer: Medicaid Other | Admitting: Cardiovascular Disease

## 2021-09-20 ENCOUNTER — Encounter: Payer: Self-pay | Admitting: Cardiovascular Disease

## 2021-09-20 ENCOUNTER — Other Ambulatory Visit: Payer: Self-pay

## 2021-09-20 VITALS — BP 90/50 | HR 73 | Ht 71.0 in | Wt 185.2 lb

## 2021-09-20 DIAGNOSIS — I2510 Atherosclerotic heart disease of native coronary artery without angina pectoris: Secondary | ICD-10-CM

## 2021-09-20 DIAGNOSIS — I255 Ischemic cardiomyopathy: Secondary | ICD-10-CM

## 2021-09-20 DIAGNOSIS — E782 Mixed hyperlipidemia: Secondary | ICD-10-CM

## 2021-09-20 DIAGNOSIS — I5022 Chronic systolic (congestive) heart failure: Secondary | ICD-10-CM | POA: Diagnosis not present

## 2021-09-20 MED ORDER — ATORVASTATIN CALCIUM 40 MG PO TABS: 40.0000 mg | ORAL_TABLET | Freq: Every day | ORAL | 3 refills | Status: DC

## 2021-09-20 NOTE — Patient Instructions (Signed)
Medication Instructions:  ?Your physician has recommended you make the following change in your medication:  ? ?- STOP Fenofibrate ?- STOP Simvastatin ? ?- START Atorvastatin 40 mg daily. An Rx has been sent to your pharmacy ? ?*If you need a refill on your cardiac medications before your next appointment, please call your pharmacy* ? ? ?Lab Work: ?None ordered ? ?If you have labs (blood work) drawn today and your tests are completely normal, you will receive your results only by: ?MyChart Message (if you have MyChart) OR ?A paper copy in the mail ?If you have any lab test that is abnormal or we need to change your treatment, we will call you to review the results. ? ? ?Testing/Procedures: ?None ordered ? ? ?Follow-Up: ?At [facility], you and your health needs are our priority.  As part of our continuing mission to provide you with exceptional heart care, we have created designated Provider Care Teams.  These Care Teams include your primary Cardiologist (physician) and Advanced Practice Providers (APPs -  Physician Assistants and Nurse Practitioners) who all work together to provide you with the care you need, when you need it. ? ?We recommend signing up for the patient portal called "MyChart".  Sign up information is provided on this After Visit Summary.  MyChart is used to connect with patients for Virtual Visits (Telemedicine).  Patients are able to view lab/test results, encounter notes, upcoming appointments, etc.  Non-urgent messages can be sent to your provider as well.   ?To learn more about what you can do with MyChart, go to [email or web address].   ? ?Your next appointment:   ?6 month(s) ? ?The format for your next appointment:   ?In Person ? ?Provider:   ?You may see Lorine Bears, MD or one of the following Advanced Practice Providers on your designated Care Team:   ?Nicolasa Ducking, NP ?Eula Listen, PA-C ?Cadence Fransico Michael, PA-C ? ? ?Other Instructions ?N/A ? ?

## 2021-09-20 NOTE — Progress Notes (Signed)
?  ?Cardiology Office Note ? ? ?Date:  09/20/2021  ? ?ID:  Carl AsalWiley [name], DOB 1960-08-30, MRN [id number] ? ?PCP:  [facility]  ?Cardiologist:   Lorine BearsMuhammad Tashi Andujo, MD  ? ?Chief Complaint  ?Patient presents with  ? Other  ?  6 Month f/u no complaints today. Meds reviewed verbally with pt.  ? ? ?  ?History of Present Illness: ?Carl [name] is a 61 y.o. male who is here today for follow-up visit regarding coronary artery disease.   ?He has history of coronary artery disease with previous myocardial infarction in 2017 status post CABG, chronic systolic heart failure due to ischemic cardiomyopathy status post ICD placement in 2017, SVT, COPD, essential hypertension, hyperlipidemia, previous stroke, bipolar disorder and tobacco use.  ?The patient has significant mental health issues.  He lives in assisted living facility.   ? ?He was hospitalized in May 2021 with respiratory distress in the setting of COPD and heart failure.  Echocardiogram showed an EF of 25 to 30%..  He underwent a Lexiscan Myoview which showed evidence of prior infarct with no ischemia.  He was hospitalized in July 2021 for sepsis secondary to pneumonia.  He ultimately underwent biventricular ICD upgrade by Dr. Graciela HusbandsKlein in October 2021.  Most recent echocardiogram in February 2022 showed an EF of 30 to 35% with mild to moderate mitral regurgitation. ?He was hospitalized in November with COPD exacerbation.  He continues to smoke 15 cigarettes a day. ?He reports no chest pain or worsening dyspnea.  He is on anticoagulation with Eliquis due to persistent atrial fibrillation. ? ?Past Medical History:  ?Diagnosis Date  ? Acute pulmonary edema (HCC) 2017  ? Acute respiratory failure with hypoxia (HCC) 2017  ? Bipolar 1 disorder (HCC)   ? CHF (congestive heart failure) (HCC)   ? Chronic kidney disease   ? COPD (chronic obstructive pulmonary disease) (HCC)   ? Depression   ? Hypertension   ? Myocardial infarction [facility](HCC)   ? NSTEMI (non-ST elevated  myocardial infarction) (HCC)   ? RVAD (right ventricular assist device) present (HCC) 01/06/2016  ? Seizures (HCC)   ? childhood  ? Stroke [facility](HCC)   ? Tobacco abuse   ? ? ?Past Surgical History:  ?Procedure Laterality Date  ? BIV UPGRADE N/A 05/05/2020  ? Procedure: BIV ICD UPGRADE;  Surgeon: Duke SalviaKlein, Steven C, MD;  [facility];  Service: Cardiovascular;  Laterality: N/A;  ? CORONARY ARTERY BYPASS GRAFT    ? ? ? ?Current Outpatient Medications  ?Medication Sig Dispense Refill  ? acetaminophen (TYLENOL) 500 MG tablet Take 500 mg by mouth every 6 (six) hours as needed for mild pain.    ? albuterol (VENTOLIN HFA) 108 (90 Base) MCG/ACT inhaler Inhale 2 puffs into the lungs every 6 (six) hours as needed for wheezing. 8 g 1  ? apixaban (ELIQUIS) 5 MG TABS tablet TAKE 1 TABLET BY MOUTH TWICE A DAY 60 tablet 6  ? atorvastatin (LIPITOR) 40 MG tablet Take 1 tablet (40 mg total) by mouth daily. 90 tablet 3  ? carvedilol (COREG) 3.125 MG tablet Take 3.125 mg by mouth 2 (two) times daily with a meal.    ? clopidogrel (PLAVIX) 75 MG tablet Take 75 mg by mouth daily.    ? divalproex (DEPAKOTE ER) 500 MG 24 hr tablet Take 1 tablet (500 mg total) by mouth 2 (two) times daily. 60 tablet 1  ? escitalopram (LEXAPRO) 10 MG tablet Take 1 tablet (10 mg total) by mouth at  bedtime. 30 tablet 1  ? feeding supplement (ENSURE ENLIVE / ENSURE PLUS) LIQD Take 237 mLs by mouth 2 (two) times daily between meals. 14220 mL 0  ? Fluticasone-Salmeterol (ADVAIR) 250-50 MCG/DOSE AEPB Inhale 1 puff into the lungs 2 (two) times daily. 60 each 0  ? folic acid (FOLVITE) 1 MG tablet Take 1 mg by mouth daily.    ? furosemide (LASIX) 40 MG tablet Take 1 tablet (40 mg total) by mouth daily. 90 tablet 3  ? levETIRAcetam (KEPPRA) 250 MG tablet Take 1 tablet (250 mg total) by mouth 2 (two) times daily. 60 tablet 0  ? levothyroxine (SYNTHROID) 25 MCG tablet Take 1 tablet (25 mcg total) by mouth daily. 30 tablet 1  ? lisinopril (ZESTRIL) 2.5 MG tablet  TAKE 1 TABLET BY MOUTH ONCE DAILY 30 tablet 11  ? omeprazole (PRILOSEC) 20 MG capsule Take 20 mg by mouth daily.    ? Potassium Chloride ER 20 MEQ TBCR Take 2 tablets by mouth daily.    ? QUEtiapine (SEROQUEL) 100 MG tablet Take 200 mg by mouth at bedtime.    ? QUEtiapine (SEROQUEL) 50 MG tablet Take 50 mg by mouth in the morning.    ? tiotropium (SPIRIVA) 18 MCG inhalation capsule Place 18 mcg into inhaler and inhale daily.    ? ?No current facility-administered medications for this visit.  ? ? ?Allergies:   Iodinated contrast media, Penicillins, Strawberry extract, Cefepime, Erythromycin, and Sulfa antibiotics  ? ? ?Social History:  The patient  reports that he has been smoking cigarettes. He has been smoking an average of 0.25 packs per day. He has never used smokeless tobacco. He reports that he does not currently use alcohol. He reports that he does not use drugs.  ? ?Family History:  The patient does not know his family history. ? ? ?ROS:  Please see the history of present illness.   Otherwise, review of systems are positive for none.   All other systems are reviewed and negative.  ? ? ?PHYSICAL EXAM: ?VS:  BP (!) 90/50 (BP Location: Left Arm, Patient Position: Sitting, Cuff Size: Normal)   Pulse 73   Ht 5\' 11"  (1.803 m)   Wt 185 lb 4 oz (84 kg)   SpO2 98%   BMI 25.84 kg/m?  , BMI Body mass index is 25.84 kg/m?. ?GEN: Well nourished, well developed, in no acute distress  ?HEENT: normal  ?Neck: no JVD, carotid bruits, or masses ?Cardiac: RRR; no murmurs, rubs, or gallops, trace bilateral leg edema. ?Respiratory:  clear to auscultation bilaterally, normal work of breathing ?GI: soft, nontender, nondistended, + BS ?MS: no deformity or atrophy  ?Skin: warm and dry, no rash ?Neuro:  Strength and sensation are intact ?Psych: euthymic mood, full affect ? ? ?EKG:  EKG is ordered today. ?The ekg ordered today demonstrates ventricular paced rhythm with underlying motion artifact. ? ?Recent Labs: ?05/24/2021: ALT  16 ?05/26/2021: BUN 48; Creatinine, Ser 1.28; Hemoglobin 11.6; Platelets 216; Potassium 3.8; Sodium 136  ? ? ?Lipid Panel ?   ?Component Value Date/Time  ? CHOL 192 05/15/2019 0651  ? TRIG 281 (H) 05/15/2019 13/01/2019  ? HDL 46 05/15/2019 0651  ? CHOLHDL 4.2 05/15/2019 0651  ? VLDL 56 (H) 05/15/2019 13/01/2019  ? LDLCALC 90 05/15/2019 0651  ? ?  ? ?Wt Readings from Last 3 Encounters:  ?09/20/21 185 lb 4 oz (84 kg)  ?08/21/21 189 lb (85.7 kg)  ?05/25/21 188 lb 15.3 oz (85.7 kg)  ?  ? ? ?  Other studies Reviewed: ?Additional studies/ records that were reviewed today include: I reviewed his previous records from [facility] and recent hospitalizations at [facility].[name] ?Review of the above records demonstrates: Findings summarized above.  Spent at least 30 minutes reviewing records. ? ?No flowsheet data found. ? ? ? ?ASSESSMENT AND PLAN: ? ?1.  Chronic systolic heart failure: Due to ischemic cardiomyopathy with recent echo showing an EF of 30 to 35%.  He appears to be euvolemic on furosemide 40 mg once daily.  Continue Coreg on small dose lisinopril.  He did not tolerate Entresto due to hypotension.   ? ?2.  Coronary artery disease involving native coronary arteries with other forms of angina: Symptoms are overall controlled with medications.  He continues to be on clopidogrel and given that he is on Eliquis, we should consider stopping this given no recent ischemic cardiac events ? ?3.  Status post biventricular ICD: Followed by Dr. Graciela Husbands. ? ?4.  Tobacco use: I discussed the importance of smoking cessation. ? ?5.  Hyperlipidemia: The patient has known history of mixed hyperlipidemia with elevated triglyceride.  However, fibrate's do not have any cardiovascular benefit.  I elected to discontinue fenofibrate and simvastatin and switch him to atorvastatin 40 mg once daily. ? ? ?Disposition:   FU with me in 6 months ? ?Signed, ? ?Lorine Bears, MD  ?09/20/2021 1:08 PM    ?[facility] ?

## 2021-11-02 ENCOUNTER — Ambulatory Visit (INDEPENDENT_AMBULATORY_CARE_PROVIDER_SITE_OTHER): Payer: Medicaid Other

## 2021-11-02 DIAGNOSIS — I255 Ischemic cardiomyopathy: Secondary | ICD-10-CM | POA: Diagnosis not present

## 2021-11-02 LAB — CUP PACEART REMOTE DEVICE CHECK
Battery Remaining Longevity: 76 mo
Battery Voltage: 2.99 V
Brady Statistic AP VP Percent: 72.39 %
Brady Statistic AP VS Percent: 1.53 %
Brady Statistic AS VP Percent: 24.55 %
Brady Statistic AS VS Percent: 1.53 %
Brady Statistic RA Percent Paced: 69.02 %
Brady Statistic RV Percent Paced: 47.32 %
Date Time Interrogation Session: 20230428043724
HighPow Impedance: 76 Ohm
Implantable Lead Implant Date: 20171201
Implantable Lead Implant Date: 20171201
Implantable Lead Implant Date: 20211029
Implantable Lead Location: 753858
Implantable Lead Location: 753859
Implantable Lead Location: 753860
Implantable Lead Model: 5076
Implantable Pulse Generator Implant Date: 20211029
Lead Channel Impedance Value: 204.14 Ohm
Lead Channel Impedance Value: 212.8 Ohm
Lead Channel Impedance Value: 218.087
Lead Channel Impedance Value: 222.34 Ohm
Lead Channel Impedance Value: 232.653
Lead Channel Impedance Value: 399 Ohm
Lead Channel Impedance Value: 418 Ohm
Lead Channel Impedance Value: 418 Ohm
Lead Channel Impedance Value: 456 Ohm
Lead Channel Impedance Value: 475 Ohm
Lead Channel Impedance Value: 646 Ohm
Lead Channel Impedance Value: 665 Ohm
Lead Channel Impedance Value: 665 Ohm
Lead Channel Impedance Value: 703 Ohm
Lead Channel Impedance Value: 722 Ohm
Lead Channel Impedance Value: 722 Ohm
Lead Channel Impedance Value: 760 Ohm
Lead Channel Impedance Value: 817 Ohm
Lead Channel Pacing Threshold Amplitude: 0.625 V
Lead Channel Pacing Threshold Amplitude: 1.125 V
Lead Channel Pacing Threshold Amplitude: 2.5 V
Lead Channel Pacing Threshold Pulse Width: 0.4 ms
Lead Channel Pacing Threshold Pulse Width: 0.4 ms
Lead Channel Pacing Threshold Pulse Width: 0.4 ms
Lead Channel Sensing Intrinsic Amplitude: 0.75 mV
Lead Channel Sensing Intrinsic Amplitude: 0.75 mV
Lead Channel Sensing Intrinsic Amplitude: 17.625 mV
Lead Channel Sensing Intrinsic Amplitude: 17.625 mV
Lead Channel Setting Pacing Amplitude: 1.5 V
Lead Channel Setting Pacing Amplitude: 1.75 V
Lead Channel Setting Pacing Amplitude: 2.5 V
Lead Channel Setting Pacing Pulse Width: 0.4 ms
Lead Channel Setting Pacing Pulse Width: 0.8 ms
Lead Channel Setting Sensing Sensitivity: 0.3 mV

## 2021-11-19 NOTE — Progress Notes (Signed)
Remote ICD transmission.   

## 2021-11-28 ENCOUNTER — Other Ambulatory Visit: Payer: Self-pay | Admitting: Cardiovascular Disease

## 2022-02-01 ENCOUNTER — Ambulatory Visit (INDEPENDENT_AMBULATORY_CARE_PROVIDER_SITE_OTHER): Payer: Medicaid Other

## 2022-02-01 DIAGNOSIS — I255 Ischemic cardiomyopathy: Secondary | ICD-10-CM

## 2022-02-01 LAB — CUP PACEART REMOTE DEVICE CHECK
Battery Remaining Longevity: 74 mo
Battery Voltage: 2.99 V
Brady Statistic AP VP Percent: 51.58 %
Brady Statistic AP VS Percent: 1.26 %
Brady Statistic AS VP Percent: 45.03 %
Brady Statistic AS VS Percent: 2.13 %
Brady Statistic RA Percent Paced: 50.54 %
Brady Statistic RV Percent Paced: 42.42 %
Date Time Interrogation Session: 20230728043623
HighPow Impedance: 94 Ohm
Implantable Lead Implant Date: 20171201
Implantable Lead Implant Date: 20171201
Implantable Lead Implant Date: 20211029
Implantable Lead Location: 753858
Implantable Lead Location: 753859
Implantable Lead Location: 753860
Implantable Lead Model: 5076
Implantable Pulse Generator Implant Date: 20211029
Lead Channel Impedance Value: 212.8 Ohm
Lead Channel Impedance Value: 216.848
Lead Channel Impedance Value: 232.653
Lead Channel Impedance Value: 232.653
Lead Channel Impedance Value: 237.5 Ohm
Lead Channel Impedance Value: 399 Ohm
Lead Channel Impedance Value: 418 Ohm
Lead Channel Impedance Value: 456 Ohm
Lead Channel Impedance Value: 475 Ohm
Lead Channel Impedance Value: 475 Ohm
Lead Channel Impedance Value: 665 Ohm
Lead Channel Impedance Value: 703 Ohm
Lead Channel Impedance Value: 703 Ohm
Lead Channel Impedance Value: 722 Ohm
Lead Channel Impedance Value: 760 Ohm
Lead Channel Impedance Value: 817 Ohm
Lead Channel Impedance Value: 817 Ohm
Lead Channel Impedance Value: 836 Ohm
Lead Channel Pacing Threshold Amplitude: 0.5 V
Lead Channel Pacing Threshold Amplitude: 1.125 V
Lead Channel Pacing Threshold Amplitude: 2.5 V
Lead Channel Pacing Threshold Pulse Width: 0.4 ms
Lead Channel Pacing Threshold Pulse Width: 0.4 ms
Lead Channel Pacing Threshold Pulse Width: 0.4 ms
Lead Channel Sensing Intrinsic Amplitude: 1.125 mV
Lead Channel Sensing Intrinsic Amplitude: 1.125 mV
Lead Channel Sensing Intrinsic Amplitude: 20.25 mV
Lead Channel Sensing Intrinsic Amplitude: 20.25 mV
Lead Channel Setting Pacing Amplitude: 1.5 V
Lead Channel Setting Pacing Amplitude: 1.75 V
Lead Channel Setting Pacing Amplitude: 2.5 V
Lead Channel Setting Pacing Pulse Width: 0.4 ms
Lead Channel Setting Pacing Pulse Width: 0.8 ms
Lead Channel Setting Sensing Sensitivity: 0.3 mV

## 2022-02-15 ENCOUNTER — Other Ambulatory Visit: Payer: Self-pay

## 2022-02-15 ENCOUNTER — Emergency Department: Payer: Medicaid Other

## 2022-02-15 ENCOUNTER — Inpatient Hospital Stay
Admission: EM | Admit: 2022-02-15 | Discharge: 2022-02-20 | DRG: 291 | Disposition: A | Discharge status: Home Health | Payer: Medicaid Other | Source: Skilled Nursing Facility | Attending: Internal Medicine | Admitting: Internal Medicine

## 2022-02-15 ENCOUNTER — Observation Stay: Payer: Medicaid Other

## 2022-02-15 DIAGNOSIS — I5023 Acute on chronic systolic (congestive) heart failure: Secondary | ICD-10-CM | POA: Diagnosis present

## 2022-02-15 DIAGNOSIS — E785 Hyperlipidemia, unspecified: Secondary | ICD-10-CM | POA: Diagnosis present

## 2022-02-15 DIAGNOSIS — Z87898 Personal history of other specified conditions: Secondary | ICD-10-CM

## 2022-02-15 DIAGNOSIS — E782 Mixed hyperlipidemia: Secondary | ICD-10-CM

## 2022-02-15 DIAGNOSIS — F319 Bipolar disorder, unspecified: Secondary | ICD-10-CM | POA: Diagnosis present

## 2022-02-15 DIAGNOSIS — I10 Essential (primary) hypertension: Secondary | ICD-10-CM

## 2022-02-15 DIAGNOSIS — I25118 Atherosclerotic heart disease of native coronary artery with other forms of angina pectoris: Secondary | ICD-10-CM | POA: Diagnosis not present

## 2022-02-15 DIAGNOSIS — R569 Unspecified convulsions: Secondary | ICD-10-CM | POA: Diagnosis present

## 2022-02-15 DIAGNOSIS — I739 Peripheral vascular disease, unspecified: Secondary | ICD-10-CM | POA: Diagnosis present

## 2022-02-15 DIAGNOSIS — Z20822 Contact with and (suspected) exposure to covid-19: Secondary | ICD-10-CM | POA: Diagnosis present

## 2022-02-15 DIAGNOSIS — Z8042 Family history of malignant neoplasm of prostate: Secondary | ICD-10-CM

## 2022-02-15 DIAGNOSIS — F1011 Alcohol abuse, in remission: Secondary | ICD-10-CM | POA: Diagnosis present

## 2022-02-15 DIAGNOSIS — R531 Weakness: Secondary | ICD-10-CM

## 2022-02-15 DIAGNOSIS — Z881 Allergy status to other antibiotic agents status: Secondary | ICD-10-CM

## 2022-02-15 DIAGNOSIS — R7989 Other specified abnormal findings of blood chemistry: Secondary | ICD-10-CM | POA: Diagnosis present

## 2022-02-15 DIAGNOSIS — I130 Hypertensive heart and chronic kidney disease with heart failure and stage 1 through stage 4 chronic kidney disease, or unspecified chronic kidney disease: Principal | ICD-10-CM | POA: Diagnosis present

## 2022-02-15 DIAGNOSIS — R778 Other specified abnormalities of plasma proteins: Secondary | ICD-10-CM | POA: Diagnosis not present

## 2022-02-15 DIAGNOSIS — Z8673 Personal history of transient ischemic attack (TIA), and cerebral infarction without residual deficits: Secondary | ICD-10-CM

## 2022-02-15 DIAGNOSIS — E039 Hypothyroidism, unspecified: Secondary | ICD-10-CM | POA: Diagnosis present

## 2022-02-15 DIAGNOSIS — N401 Enlarged prostate with lower urinary tract symptoms: Secondary | ICD-10-CM | POA: Diagnosis present

## 2022-02-15 DIAGNOSIS — K760 Fatty (change of) liver, not elsewhere classified: Secondary | ICD-10-CM | POA: Diagnosis present

## 2022-02-15 DIAGNOSIS — R791 Abnormal coagulation profile: Secondary | ICD-10-CM | POA: Diagnosis present

## 2022-02-15 DIAGNOSIS — F419 Anxiety disorder, unspecified: Secondary | ICD-10-CM | POA: Diagnosis present

## 2022-02-15 DIAGNOSIS — R109 Unspecified abdominal pain: Secondary | ICD-10-CM | POA: Diagnosis present

## 2022-02-15 DIAGNOSIS — I5022 Chronic systolic (congestive) heart failure: Secondary | ICD-10-CM | POA: Diagnosis not present

## 2022-02-15 DIAGNOSIS — N1831 Chronic kidney disease, stage 3a: Secondary | ICD-10-CM | POA: Diagnosis present

## 2022-02-15 DIAGNOSIS — Z720 Tobacco use: Secondary | ICD-10-CM | POA: Diagnosis present

## 2022-02-15 DIAGNOSIS — Z7901 Long term (current) use of anticoagulants: Secondary | ICD-10-CM

## 2022-02-15 DIAGNOSIS — I255 Ischemic cardiomyopathy: Secondary | ICD-10-CM | POA: Diagnosis present

## 2022-02-15 DIAGNOSIS — I509 Heart failure, unspecified: Secondary | ICD-10-CM

## 2022-02-15 DIAGNOSIS — J9601 Acute respiratory failure with hypoxia: Secondary | ICD-10-CM | POA: Diagnosis present

## 2022-02-15 DIAGNOSIS — R0902 Hypoxemia: Secondary | ICD-10-CM

## 2022-02-15 DIAGNOSIS — J441 Chronic obstructive pulmonary disease with (acute) exacerbation: Secondary | ICD-10-CM | POA: Diagnosis present

## 2022-02-15 DIAGNOSIS — Z95810 Presence of automatic (implantable) cardiac defibrillator: Secondary | ICD-10-CM | POA: Diagnosis not present

## 2022-02-15 DIAGNOSIS — R1011 Right upper quadrant pain: Secondary | ICD-10-CM

## 2022-02-15 DIAGNOSIS — R0600 Dyspnea, unspecified: Secondary | ICD-10-CM | POA: Diagnosis not present

## 2022-02-15 DIAGNOSIS — I2510 Atherosclerotic heart disease of native coronary artery without angina pectoris: Secondary | ICD-10-CM | POA: Diagnosis present

## 2022-02-15 DIAGNOSIS — I480 Paroxysmal atrial fibrillation: Secondary | ICD-10-CM | POA: Diagnosis present

## 2022-02-15 DIAGNOSIS — I5020 Unspecified systolic (congestive) heart failure: Secondary | ICD-10-CM | POA: Diagnosis not present

## 2022-02-15 DIAGNOSIS — I5043 Acute on chronic combined systolic (congestive) and diastolic (congestive) heart failure: Secondary | ICD-10-CM | POA: Diagnosis not present

## 2022-02-15 DIAGNOSIS — I252 Old myocardial infarction: Secondary | ICD-10-CM

## 2022-02-15 DIAGNOSIS — Z7951 Long term (current) use of inhaled steroids: Secondary | ICD-10-CM

## 2022-02-15 DIAGNOSIS — Z951 Presence of aortocoronary bypass graft: Secondary | ICD-10-CM

## 2022-02-15 DIAGNOSIS — R0609 Other forms of dyspnea: Secondary | ICD-10-CM | POA: Diagnosis not present

## 2022-02-15 DIAGNOSIS — Z79899 Other long term (current) drug therapy: Secondary | ICD-10-CM

## 2022-02-15 DIAGNOSIS — R0602 Shortness of breath: Secondary | ICD-10-CM | POA: Diagnosis present

## 2022-02-15 DIAGNOSIS — Z91018 Allergy to other foods: Secondary | ICD-10-CM

## 2022-02-15 DIAGNOSIS — F17210 Nicotine dependence, cigarettes, uncomplicated: Secondary | ICD-10-CM | POA: Diagnosis present

## 2022-02-15 DIAGNOSIS — Z7902 Long term (current) use of antithrombotics/antiplatelets: Secondary | ICD-10-CM

## 2022-02-15 DIAGNOSIS — R338 Other retention of urine: Secondary | ICD-10-CM | POA: Diagnosis present

## 2022-02-15 DIAGNOSIS — Z882 Allergy status to sulfonamides status: Secondary | ICD-10-CM

## 2022-02-15 DIAGNOSIS — Z91041 Radiographic dye allergy status: Secondary | ICD-10-CM

## 2022-02-15 DIAGNOSIS — Z880 Allergy status to penicillin: Secondary | ICD-10-CM

## 2022-02-15 DIAGNOSIS — Z79890 Hormone replacement therapy (postmenopausal): Secondary | ICD-10-CM

## 2022-02-15 DIAGNOSIS — I959 Hypotension, unspecified: Secondary | ICD-10-CM | POA: Diagnosis present

## 2022-02-15 LAB — CBC WITH DIFFERENTIAL/PLATELET
Abs Immature Granulocytes: 0.11 10*3/uL — ABNORMAL HIGH (ref 0.00–0.07)
Basophils Absolute: 0.1 10*3/uL (ref 0.0–0.1)
Basophils Relative: 1 %
Eosinophils Absolute: 0.2 10*3/uL (ref 0.0–0.5)
Eosinophils Relative: 2 %
HCT: 42.4 % (ref 39.0–52.0)
Hemoglobin: 13.7 g/dL (ref 13.0–17.0)
Immature Granulocytes: 1 %
Lymphocytes Relative: 23 %
Lymphs Abs: 2.4 10*3/uL (ref 0.7–4.0)
MCH: 28.7 pg (ref 26.0–34.0)
MCHC: 32.3 g/dL (ref 30.0–36.0)
MCV: 88.9 fL (ref 80.0–100.0)
Monocytes Absolute: 1 10*3/uL (ref 0.1–1.0)
Monocytes Relative: 10 %
Neutro Abs: 6.5 10*3/uL (ref 1.7–7.7)
Neutrophils Relative %: 63 %
Platelets: 184 10*3/uL (ref 150–400)
RBC: 4.77 MIL/uL (ref 4.22–5.81)
RDW: 16.7 % — ABNORMAL HIGH (ref 11.5–15.5)
WBC: 10.3 10*3/uL (ref 4.0–10.5)
nRBC: 0.2 % (ref 0.0–0.2)

## 2022-02-15 LAB — CBG MONITORING, ED: Glucose-Capillary: 167 mg/dL — ABNORMAL HIGH (ref 70–99)

## 2022-02-15 LAB — URINALYSIS, ROUTINE W REFLEX MICROSCOPIC
Bacteria, UA: NONE SEEN
Bilirubin Urine: NEGATIVE
Glucose, UA: NEGATIVE mg/dL
Hgb urine dipstick: NEGATIVE
Ketones, ur: NEGATIVE mg/dL
Leukocytes,Ua: NEGATIVE
Nitrite: NEGATIVE
Protein, ur: 30 mg/dL — AB
Specific Gravity, Urine: 1.009 (ref 1.005–1.030)
pH: 6 (ref 5.0–8.0)

## 2022-02-15 LAB — APTT
aPTT: >200 seconds (ref 24–36)
aPTT: 35 seconds (ref 24–36)

## 2022-02-15 LAB — COMPREHENSIVE METABOLIC PANEL
ALT: 32 U/L (ref 0–44)
AST: 49 U/L — ABNORMAL HIGH (ref 15–41)
Albumin: 3.4 g/dL — ABNORMAL LOW (ref 3.5–5.0)
Alkaline Phosphatase: 82 U/L (ref 38–126)
Anion gap: 12 (ref 5–15)
BUN: 24 mg/dL — ABNORMAL HIGH (ref 8–23)
CO2: 20 mmol/L — ABNORMAL LOW (ref 22–32)
Calcium: 8.9 mg/dL (ref 8.9–10.3)
Chloride: 106 mmol/L (ref 98–111)
Creatinine, Ser: 1.35 mg/dL — ABNORMAL HIGH (ref 0.61–1.24)
GFR, Estimated: 59 mL/min — ABNORMAL LOW (ref >60)
Glucose, Bld: 231 mg/dL — ABNORMAL HIGH (ref 70–99)
Potassium: 4.3 mmol/L (ref 3.5–5.1)
Sodium: 138 mmol/L (ref 135–145)
Total Bilirubin: 1 mg/dL (ref 0.3–1.2)
Total Protein: 7 g/dL (ref 6.5–8.1)

## 2022-02-15 LAB — TROPONIN I (HIGH SENSITIVITY)
Troponin I (High Sensitivity): 65 ng/L — ABNORMAL HIGH (ref <18)
Troponin I (High Sensitivity): 68 ng/L — ABNORMAL HIGH (ref <18)
Troponin I (High Sensitivity): 102 ng/L (ref <18)
Troponin I (High Sensitivity): 83 ng/L — ABNORMAL HIGH (ref <18)

## 2022-02-15 LAB — AMMONIA: Ammonia: 21 umol/L (ref 9–35)

## 2022-02-15 LAB — PROTIME-INR
INR: 1.3 — ABNORMAL HIGH (ref 0.8–1.2)
Prothrombin Time: 16.5 seconds — ABNORMAL HIGH (ref 11.4–15.2)

## 2022-02-15 LAB — HEPARIN LEVEL (UNFRACTIONATED): Heparin Unfractionated: >1.1 IU/mL — ABNORMAL HIGH (ref 0.30–0.70)

## 2022-02-15 LAB — LACTIC ACID, PLASMA
Lactic Acid, Venous: 3.6 mmol/L (ref 0.5–1.9)
Lactic Acid, Venous: 3.3 mmol/L (ref 0.5–1.9)

## 2022-02-15 LAB — BLOOD GAS, VENOUS
Acid-base deficit: 4.8 mmol/L — ABNORMAL HIGH (ref 0.0–2.0)
Bicarbonate: 21.6 mmol/L (ref 20.0–28.0)
Delivery systems: POSITIVE
FIO2: 60 %
O2 Saturation: 98.8 %
Patient temperature: 37
pCO2, Ven: 44 mmHg (ref 44–60)
pH, Ven: 7.3 (ref 7.25–7.43)
pO2, Ven: 106 mmHg — ABNORMAL HIGH (ref 32–45)

## 2022-02-15 LAB — D-DIMER, QUANTITATIVE: D-Dimer, Quant: 0.63 ug/mL-FEU — ABNORMAL HIGH (ref 0.00–0.50)

## 2022-02-15 LAB — PROCALCITONIN: Procalcitonin: <0.1 ng/mL

## 2022-02-15 LAB — BRAIN NATRIURETIC PEPTIDE: B Natriuretic Peptide: 888.4 pg/mL — ABNORMAL HIGH (ref 0.0–100.0)

## 2022-02-15 LAB — RESP PANEL BY RT-PCR (FLU A&B, COVID) ARPGX2
Influenza A by PCR: NEGATIVE
Influenza B by PCR: NEGATIVE
SARS Coronavirus 2 by RT PCR: NEGATIVE

## 2022-02-15 LAB — SARS CORONAVIRUS 2 BY RT PCR: SARS Coronavirus 2 by RT PCR: NEGATIVE

## 2022-02-15 LAB — FIBRINOGEN: Fibrinogen: 361 mg/dL (ref 210–475)

## 2022-02-15 MED ORDER — ROSUVASTATIN CALCIUM 20 MG PO TABS
40.0000 mg | ORAL_TABLET | Freq: Every day | ORAL | Status: DC
Start: 2022-02-15 — End: 2022-02-15

## 2022-02-15 MED ORDER — QUETIAPINE FUMARATE 25 MG PO TABS
100.0000 mg | ORAL_TABLET | Freq: Every day | ORAL | Status: DC
  Administered 2022-02-15 – 2022-02-19 (×5): 100 mg via ORAL
  Filled 2022-02-15 (×5): qty 4

## 2022-02-15 MED ORDER — METHYLPREDNISOLONE SODIUM SUCC 40 MG IJ SOLR
40.0000 mg | Freq: Once | INTRAMUSCULAR | Status: AC
  Administered 2022-02-15: 40 mg via INTRAVENOUS
  Filled 2022-02-15: qty 1

## 2022-02-15 MED ORDER — DIPHENHYDRAMINE HCL 50 MG/ML IJ SOLN
50.0000 mg | Freq: Once | INTRAMUSCULAR | Status: AC
  Administered 2022-02-15: 50 mg via INTRAVENOUS
  Filled 2022-02-15: qty 1

## 2022-02-15 MED ORDER — QUETIAPINE FUMARATE 200 MG PO TABS
200.0000 mg | ORAL_TABLET | Freq: Every day | ORAL | Status: DC
Start: 2022-02-15 — End: 2022-02-15

## 2022-02-15 MED ORDER — DIVALPROEX SODIUM ER 500 MG PO TB24
500.0000 mg | ORAL_TABLET | Freq: Two times a day (BID) | ORAL | Status: DC
  Administered 2022-02-15 – 2022-02-20 (×10): 500 mg via ORAL
  Filled 2022-02-15 (×10): qty 1

## 2022-02-15 MED ORDER — LISINOPRIL 5 MG PO TABS
2.5000 mg | ORAL_TABLET | Freq: Every day | ORAL | Status: DC
  Administered 2022-02-16 – 2022-02-20 (×5): 2.5 mg via ORAL
  Filled 2022-02-15 (×5): qty 1

## 2022-02-15 MED ORDER — HYDROXYZINE HCL 10 MG PO TABS
10.0000 mg | ORAL_TABLET | Freq: Two times a day (BID) | ORAL | Status: DC
  Administered 2022-02-15 – 2022-02-20 (×10): 10 mg via ORAL
  Filled 2022-02-15 (×10): qty 1

## 2022-02-15 MED ORDER — LEVOTHYROXINE SODIUM 25 MCG PO TABS
25.0000 ug | ORAL_TABLET | Freq: Every day | ORAL | Status: DC
  Administered 2022-02-15 – 2022-02-20 (×6): 25 ug via ORAL
  Filled 2022-02-15 (×6): qty 1

## 2022-02-15 MED ORDER — TRAMADOL HCL 50 MG PO TABS
50.0000 mg | ORAL_TABLET | Freq: Two times a day (BID) | ORAL | Status: DC | PRN
  Administered 2022-02-18: 50 mg via ORAL
  Filled 2022-02-15: qty 1

## 2022-02-15 MED ORDER — IOHEXOL 350 MG/ML SOLN
75.0000 mL | Freq: Once | INTRAVENOUS | Status: AC | PRN
  Administered 2022-02-15: 75 mL via INTRAVENOUS

## 2022-02-15 MED ORDER — HEPARIN (PORCINE) 25000 UT/250ML-% IV SOLN: 10.0000 [IU]/kg/h | INTRAVENOUS | Status: DC

## 2022-02-15 MED ORDER — ONDANSETRON HCL 4 MG/2ML IJ SOLN
4.0000 mg | Freq: Four times a day (QID) | INTRAMUSCULAR | Status: AC | PRN
Start: 2022-02-15 — End: 2022-02-20

## 2022-02-15 MED ORDER — ASPIRIN 81 MG PO CHEW
324.0000 mg | CHEWABLE_TABLET | Freq: Once | ORAL | Status: AC
  Administered 2022-02-15: 324 mg via ORAL
  Filled 2022-02-15: qty 4

## 2022-02-15 MED ORDER — FUROSEMIDE 10 MG/ML IJ SOLN
40.0000 mg | Freq: Every day | INTRAMUSCULAR | Status: AC
  Administered 2022-02-16 – 2022-02-17 (×2): 40 mg via INTRAVENOUS
  Filled 2022-02-15 (×2): qty 4

## 2022-02-15 MED ORDER — ACETAMINOPHEN 325 MG PO TABS: 650.0000 mg | ORAL_TABLET | Freq: Four times a day (QID) | ORAL | Status: AC | PRN

## 2022-02-15 MED ORDER — IPRATROPIUM-ALBUTEROL 0.5-2.5 (3) MG/3ML IN SOLN
3.0000 mL | Freq: Three times a day (TID) | RESPIRATORY_TRACT | Status: DC | PRN
  Administered 2022-02-16: 3 mL via RESPIRATORY_TRACT
  Filled 2022-02-15: qty 3

## 2022-02-15 MED ORDER — ACETAMINOPHEN 650 MG RE SUPP: 650.0000 mg | Freq: Four times a day (QID) | RECTAL | Status: AC | PRN

## 2022-02-15 MED ORDER — LEVETIRACETAM 250 MG PO TABS
250.0000 mg | ORAL_TABLET | Freq: Two times a day (BID) | ORAL | Status: DC
  Administered 2022-02-15 – 2022-02-20 (×10): 250 mg via ORAL
  Filled 2022-02-15 (×10): qty 1

## 2022-02-15 MED ORDER — IPRATROPIUM-ALBUTEROL 0.5-2.5 (3) MG/3ML IN SOLN
3.0000 mL | Freq: Once | RESPIRATORY_TRACT | Status: AC
  Administered 2022-02-15: 3 mL via RESPIRATORY_TRACT

## 2022-02-15 MED ORDER — ESCITALOPRAM OXALATE 10 MG PO TABS
10.0000 mg | ORAL_TABLET | Freq: Every day | ORAL | Status: DC
  Administered 2022-02-15 – 2022-02-19 (×5): 10 mg via ORAL
  Filled 2022-02-15 (×5): qty 1

## 2022-02-15 MED ORDER — DIPHENHYDRAMINE HCL 25 MG PO CAPS
50.0000 mg | ORAL_CAPSULE | Freq: Once | ORAL | Status: AC
  Filled 2022-02-15: qty 2

## 2022-02-15 MED ORDER — ATORVASTATIN CALCIUM 20 MG PO TABS
40.0000 mg | ORAL_TABLET | Freq: Every day | ORAL | Status: DC
Start: 2022-02-15 — End: 2022-02-19
  Administered 2022-02-15 – 2022-02-18 (×4): 40 mg via ORAL
  Filled 2022-02-15 (×4): qty 2

## 2022-02-15 MED ORDER — FUROSEMIDE 10 MG/ML IJ SOLN
40.0000 mg | Freq: Once | INTRAMUSCULAR | Status: AC
  Administered 2022-02-15: 40 mg via INTRAVENOUS
  Filled 2022-02-15: qty 4

## 2022-02-15 MED ORDER — NITROGLYCERIN 2 % TD OINT
0.5000 [in_us] | TOPICAL_OINTMENT | Freq: Once | TRANSDERMAL | Status: AC
  Administered 2022-02-15: 0.5 [in_us] via TOPICAL
  Filled 2022-02-15: qty 1

## 2022-02-15 MED ORDER — HEPARIN (PORCINE) 25000 UT/250ML-% IV SOLN
1100.0000 [IU]/h | INTRAVENOUS | Status: AC
  Administered 2022-02-15: 1100 [IU]/h via INTRAVENOUS
  Administered 2022-02-15: 950 [IU]/h via INTRAVENOUS
  Administered 2022-02-16: 1100 [IU]/h via INTRAVENOUS
  Filled 2022-02-15: qty 250

## 2022-02-15 MED ORDER — SENNOSIDES-DOCUSATE SODIUM 8.6-50 MG PO TABS: 1.0000 | ORAL_TABLET | Freq: Every evening | ORAL | Status: DC | PRN

## 2022-02-15 MED ORDER — HEPARIN SODIUM (PORCINE) 5000 UNIT/ML IJ SOLN
4000.0000 [IU] | Freq: Once | INTRAMUSCULAR | Status: AC
  Administered 2022-02-15: 4000 [IU] via INTRAVENOUS
  Filled 2022-02-15: qty 1

## 2022-02-15 MED ORDER — CARVEDILOL 3.125 MG PO TABS
3.1250 mg | ORAL_TABLET | Freq: Two times a day (BID) | ORAL | Status: DC
  Administered 2022-02-15 – 2022-02-20 (×10): 3.125 mg via ORAL
  Filled 2022-02-15 (×9): qty 1

## 2022-02-15 MED ORDER — MOMETASONE FURO-FORMOTEROL FUM 200-5 MCG/ACT IN AERO
2.0000 | INHALATION_SPRAY | Freq: Two times a day (BID) | RESPIRATORY_TRACT | Status: DC
  Administered 2022-02-15: 2 via RESPIRATORY_TRACT
  Filled 2022-02-15: qty 8.8

## 2022-02-15 MED ORDER — NICOTINE 21 MG/24HR TD PT24
21.0000 mg | MEDICATED_PATCH | Freq: Every day | TRANSDERMAL | Status: DC | PRN
Start: 2022-02-15 — End: 2022-02-20
  Filled 2022-02-15: qty 1

## 2022-02-15 MED ORDER — METHYLPREDNISOLONE SODIUM SUCC 125 MG IJ SOLR
INTRAMUSCULAR | Status: AC
  Filled 2022-02-15: qty 2

## 2022-02-15 MED ORDER — ONDANSETRON HCL 4 MG PO TABS: 4.0000 mg | ORAL_TABLET | Freq: Four times a day (QID) | ORAL | Status: AC | PRN

## 2022-02-15 MED ORDER — IPRATROPIUM-ALBUTEROL 0.5-2.5 (3) MG/3ML IN SOLN
3.0000 mL | Freq: Once | RESPIRATORY_TRACT | Status: AC
  Administered 2022-02-15: 3 mL via RESPIRATORY_TRACT
  Filled 2022-02-15: qty 3

## 2022-02-15 MED ORDER — IPRATROPIUM-ALBUTEROL 0.5-2.5 (3) MG/3ML IN SOLN
RESPIRATORY_TRACT | Status: AC
  Filled 2022-02-15: qty 3

## 2022-02-15 NOTE — Assessment & Plan Note (Signed)
-   Eliquis 5 mg p.o. twice daily has been held on admission as patient is on heparin gtt.

## 2022-02-15 NOTE — ED Notes (Signed)
Pt assisted with drink per request and reoriented to room/urinals at bedside.

## 2022-02-15 NOTE — Assessment & Plan Note (Addendum)
-   I suspect secondary to demand ischemia and low clinical suspicion for ACS at this time due to EKG negative for ischemic changes and patient denies chest pain - However continue with heparin GTT per pharmacy via EDP order and trend troponins - We will continue with main treatment for primary problem

## 2022-02-15 NOTE — Progress Notes (Signed)
Patient received from ED via stretcher and transport staff. IV Heparin was not infusing. Reached out to pharmacy, Belenda Cruise [facility] aware, ordered restart heparin at 9.40ml/hr.

## 2022-02-15 NOTE — ED Notes (Signed)
Pt placed back on bipap at this time.

## 2022-02-15 NOTE — ED Notes (Signed)
Pt placed on 4 liter's [place] for CT- oxygen reading 97% currently

## 2022-02-15 NOTE — Assessment & Plan Note (Signed)
-   Levothyroxine 25 mcg daily resumed 

## 2022-02-15 NOTE — ED Triage Notes (Addendum)
[facility] that started about 1 hour prior to EMS arrival- pt was 80% on RA upon their arrival and was 90% on NRB- pt got 125 solumedrol, 2 albuterol, 1 duoneb, and 2g mag by EMS- pt arrives on cpap at 97%

## 2022-02-15 NOTE — ED Notes (Signed)
DO [facility] notified of PTT of >200

## 2022-02-15 NOTE — Progress Notes (Signed)
ANTICOAGULATION CONSULT NOTE - Initial Consult  Pharmacy Consult for Heparin Drip Indication: chest pain/ACS  Allergies  Allergen Reactions   Iodinated Contrast Media Shortness Of Breath   Penicillins Anaphylaxis and Shortness Of Breath    Respiratory  Tolerated cefuroxime on 01/06/16   Strawberry Extract Anaphylaxis   Cefepime Itching    Empiric antibiotic, developed pruritis.    Erythromycin Itching   Sulfa Antibiotics Itching, Nausea And Vomiting and Nausea Only    Patient Measurements: Height: 5\' 10"  (177.8 cm) Weight: 87 kg (191 lb 12.8 oz) IBW/kg (Calculated) : 73 Heparin Dosing Weight: 87 kg  Vital Signs: Temp: 97.9 F (36.6 C) (08/11 1200) Temp Source: Oral (08/11 1200) BP: 120/75 (08/11 1300) Pulse Rate: 74 (08/11 1300)  Labs: Recent Labs    02/15/22 0736 02/15/22 0925 02/15/22 1243  HGB 13.7  --   --   HCT 42.4  --   --   PLT 184  --   --   LABPROT  --   --  16.5*  INR  --   --  1.3*  HEPARINUNFRC  --   --  >1.10*  CREATININE 1.35*  --   --   TROPONINIHS 65* 102*  --     Estimated Creatinine Clearance: 57.8 mL/min (A) (by C-G formula based on SCr of 1.35 mg/dL (H)).   Medical History: Past Medical History:  Diagnosis Date   Acute pulmonary edema (HCC) 2017   Acute respiratory failure with hypoxia (HCC) 2017   Bipolar 1 disorder (HCC)    CHF (congestive heart failure) (HCC)    Chronic kidney disease    COPD (chronic obstructive pulmonary disease) (HCC)    Depression    Hypertension    Myocardial infarction [facility])    NSTEMI (non-ST elevated myocardial infarction) (HCC)    RVAD (right ventricular assist device) present (HCC) 01/06/2016   Seizures (HCC)    childhood   Stroke (HCC)    Tobacco abuse     Medications:  Apixaban prior to admission  Assessment: Patient is a 61yo male presenting from Assisted Living facility with SOB. Troponin is trending up. Pharmacy consulted for Heparin dosing. Patient was taking Apixaban prior to  admission.  Baseline HL: >1.10  Goal of Therapy:  Heparin level 0.3-0.7 units/ml aPTT 66-102 seconds Monitor platelets by anticoagulation protocol: Yes   Plan:  Heparin 4000 units IV bolus Heparin 1100 units/hr infusion Since patient was taking Apixaban prior to admission and his baseline HL is elevated at >1.10 will need to use aPTT to guide dosing. Check aPTT in 6 hours Will check a daily HL to assess when aPTT and HL correlate Daily CBC while on Heparin infusion  61yo, PharmD, BCPS 02/15/2022 1:51 PM

## 2022-02-15 NOTE — H&P (Addendum)
History and Physical   Carl [name] [id number] DOB: [birth year] DOA: 02/15/2022  PCP: Orvis Brill, [facility]  Outpatient Specialists: Dr. Fletcher Anon, [facility] cardiology Patient coming from: [place] via EMS  I have personally briefly reviewed patient's old medical records in [place].  Chief Concern: Shortness of breath  HPI: Carl [name] is a 61 year old male with history of CAD status post CABG in 2017, biventricular ICD presents status post upgrade on October 2021, atrial fibrillation on Eliquis, hyperlipidemia, hypothyroid, ckd3a, hypertension, heart failure reduced ejection fraction, persistent daily tobacco cigarette smoking, who presents emergency department for chief concerns of shortness of breath.  Per report he was found to be hypoxic with SpO2 in the 80s via EMS and was placed on CPAP and transferred to the emergency department for further evaluation.  Initial vitals in the emergency department showed temperature of 97.7, respiration rate of 31, heart rate of 85, blood pressure 134/88, SpO2 currently 100% on BiPAP.  VBG in the emergency department showed 7.3/44/106.  Serum sodium 138, potassium 4.3, chloride 106, bicarb 20, BUN of 24, serum creatinine of 1.35, GFR 59, nonfasting blood glucose 231.  His D-dimer was elevated at 0.63.  High sensitive troponin was 65 and increased to 102.  UA was negative for nitrates and leukocytes.  Lactic acid was elevated at 3.6 to 3.3.  EDP ordered a CTA of the chest to rule out PE.  ED treatment: Lasix 40 mg IV one-time dose, DuoNebs x 2, Solu-Medrol 40 mg IV one-time dose, nitroglycerin, Benadryl.  At bedside patient was able to tell me his name.  He was not able to tell me his age.  He does not know the current, the year or the current month.  He knows he is in the hospital.  Patient appears confused and not able to answer my questions appropriately.  He was able to tell me that approximately 5:30 AM on day  of admission he was sitting up in bed and when he experienced sudden shortness of breath and epigastric with chest pressure.  He states it felt like somebody was sitting on him.  He reports he is never felt this way before.  He was not able to tell me his experience in 2017 when he underwent CABG surgery.  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  His wife at that time urged him to be get evaluated.  Social history: He is currently living at [facility].  He is a legal guardian and it is not his family members.  He reports he is a widower.  He denies current tobacco, EtOH, recreational drug use.  He is currently disabled and formerly was a Training and development officer.  ROS: Constitutional: no weight change, no fever ENT/Mouth: no sore throat, no rhinorrhea Eyes: no eye pain, no vision changes Cardiovascular: no chest pain, no dyspnea,  no edema, no palpitations Respiratory: no cough, no sputum, no wheezing Gastrointestinal: no nausea, no vomiting, no diarrhea, no constipation Genitourinary: no urinary incontinence, no dysuria, no hematuria Musculoskeletal: no arthralgias, no myalgias Skin: no skin lesions, no pruritus, Neuro: + weakness, no loss of consciousness, no syncope Psych: no anxiety, no depression, + decrease appetite Heme/Lymph: no bruising, no bleeding  ED Course: Discussed with emergency medicine provider, patient requiring hospitalization for chief concerns of heart exacerbation.  Assessment/Plan  Principal Problem:   Acute exacerbation of CHF (congestive heart failure) (  HCC) Active Problems:   Elevated troponin   Bipolar 1 disorder (HCC)   Coronary artery disease   Essential hypertension   Hypothyroidism   Seizure (HCC)   HLD (hyperlipidemia)   Ischemic cardiomyopathy   History of alcohol abuse   Peripheral artery disease (HCC)   Urinary  retention due to benign prostatic hyperplasia   Chronic anticoagulation   Weakness   Anxiety   Tobacco use   Elevated partial thromboplastin time (PTT)   Abdominal pain   Assessment and Plan:  * Acute exacerbation of CHF (congestive heart failure) (HCC) On chronic systolic heart failure secondary to ischemic cardiomyopathy CTA of chest imaging read as changes from prior cardiac surgery and mitral valve replacement - Etiology workup in progress at this time - Check procalcitonin, COVID PCR - Complete echo ordered - Continue with BiPAP continuously - Patient endorses continued shortness of breath and/or chest pain, we will consider initiating nitroglycerin gtt. - Cardiology/[facility] has been consulted for heart failure medication optimization hospital evaluation for mitral valve integrity - Admit  to stepdown, inpatient  Elevated troponin - I suspect secondary to demand ischemia and low clinical suspicion for ACS at this time due to EKG negative for ischemic changes and patient denies chest pain - However continue with heparin GTT per pharmacy via EDP order and trend troponins - We will continue with main treatment for primary problem  Abdominal pain - Right upper quadrant - Right upper quadrant ultrasound  Elevated partial thromboplastin time (PTT) I suspect this is secondary to heparin GTT - We will repeat PTT - I ordered fibrinogen - Patient was negative for petechiae and bleeding from IV sites on admission physical exam and platelet is reassuring  - Discussed with nursing staff to let pharmacy know and if needed we will adjust heparin GTT rate - Repeat CBC in the a.m.  Tobacco use - Nicotine as needed patch ordered  Anxiety - Seroquel 100 milligrams nightly  Chronic anticoagulation - Eliquis 5 mg p.o. twice daily has been held on admission as patient is on heparin gtt.  Ischemic cardiomyopathy - Status post ICD placement in 2017 with biventricular ICD upgrade in October  2021  HLD (hyperlipidemia) - Rosuvastatin 40 mg nightly resumed  Hypothyroidism - Levothyroxine 25 mcg daily resumed  Essential hypertension - Lisinopril 2.5 mg p.o. daily resumed for 02/16/2022 - Coreg 3.125 mg p.o. twice daily resumed on day of admission - Furosemide 40 mg IV daily ordered for 2 doses starting on 02/16/2022  Coronary artery disease - Status post CABG in 2017  Bipolar 1 disorder (HCC) - Depakote 500 mg p.o. twice daily and Keppra to 50 mg p.o. twice daily resumed  Chart reviewed.   Complete echo on 08/16/2020: Left ejection fraction estimated at 30 to 35%, left ventricle has moderate to severely decreased function.  The left ventricle demonstrates global hypokinesis.  Left ventricular diastolic parameters are consistent with grade 2 diastolic dysfunction.  DVT prophylaxis: Heparin GGT Code Status: Full code Diet: Heart healthy now Family Communication: No Disposition Plan: Pending clinical course Consults called: Cardiology Admission status: Stepdown, inpatient  Past Medical History:  Diagnosis Date   Acute pulmonary edema (HCC) 2017   Acute respiratory failure with hypoxia (HCC) 2017   Bipolar 1 disorder (HCC)    CHF (congestive heart failure) (HCC)    Chronic kidney disease    COPD (chronic obstructive pulmonary disease) (HCC)    Depression    Hypertension    Myocardial infarction [facility])    NSTEMI (non-ST  elevated myocardial infarction) [facility])    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 He has been smoking an average of 0.25 packs per day. He has never  used smokeless tobacco. He reports that he does not currently use alcohol. He reports that he does not use drugs.  Allergies  Allergen Reactions   Iodinated Contrast Media Shortness Of Breath   Penicillins Anaphylaxis and Shortness Of Breath    Respiratory  Tolerated cefuroxime on 01/06/16   Strawberry Extract Anaphylaxis   Cefepime Itching    Empiric antibiotic, developed pruritis.    Erythromycin Itching   Sulfa Antibiotics Itching, Nausea And Vomiting and Nausea Only   Family History  Problem Relation Age of Onset   Cancer Mother        "femal cancer"   Prostate cancer Father    Family history: Family history reviewed and not pertinent.  Prior to Admission medications   Medication Sig Start Date End Date Taking? Authorizing Provider  acetaminophen (TYLENOL) 500 MG tablet Take 500 mg by mouth every 6 (six) hours as needed for mild pain.    [provider]  albuterol (VENTOLIN HFA) 108 (90 Base) MCG/ACT inhaler Inhale 2 puffs into the lungs every 6 (six) hours as needed for wheezing. 05/24/19 04/28/22  Clapacs, Jackquline Denmark, MD  apixaban (ELIQUIS) 5 MG TABS tablet TAKE 1 TABLET BY MOUTH TWICE A DAY 01/18/21   Duke Salvia, MD  atorvastatin (LIPITOR) 40 MG tablet Take 1 tablet (40 mg total) by mouth daily. 09/20/21 12/19/21  Iran Ouch, MD  carvedilol (COREG) 3.125 MG tablet Take 3.125 mg by mouth 2 (two) times daily with a meal.    [provider]  clopidogrel (PLAVIX) 75 MG tablet Take 75 mg by mouth daily. 05/17/21   [provider]  divalproex (DEPAKOTE ER) 500 MG 24 hr tablet Take 1 tablet (500 mg total) by mouth 2 (two) times daily. 05/24/19   Clapacs, Jackquline Denmark, MD  escitalopram (LEXAPRO) 10 MG tablet Take 1 tablet (10 mg total) by mouth at bedtime. 05/24/19   Clapacs, Jackquline Denmark, MD  feeding supplement (ENSURE ENLIVE / ENSURE PLUS) LIQD Take 237 mLs by mouth 2 (two) times daily between meals. 05/26/21   Alford Highland, MD  Fluticasone-Salmeterol (ADVAIR)  250-50 MCG/DOSE AEPB Inhale 1 puff into the lungs 2 (two) times daily. 05/26/21   Alford Highland, MD  folic acid (FOLVITE) 1 MG tablet Take 1 mg by mouth daily.    [provider]  furosemide (LASIX) 40 MG tablet Take 1 tablet (40 mg total) by mouth daily. 08/01/20   Duke Salvia, MD  Hydrocortisone Acetate 1 % CREA SMARTSIG:sparingly Topical 3 Times Daily 12/24/21   [provider]  hydrOXYzine (ATARAX) 10 MG tablet Take 10 mg by mouth 2 (two) times daily. 12/24/21   [provider]  levETIRAcetam (KEPPRA) 250 MG tablet Take 1 tablet (250 mg total) by mouth 2 (two) times daily. 03/24/20   Minna Antis, MD  levothyroxine (SYNTHROID) 25 MCG tablet Take 1 tablet (25 mcg  total) by mouth daily. 05/24/19   Clapacs, Madie Reno, MD  lisinopril (ZESTRIL) 2.5 MG tablet TAKE 1 TABLET BY MOUTH ONCE DAILY 01/21/21   Loel Dubonnet, NP  omeprazole (PRILOSEC) 20 MG capsule Take 20 mg by mouth daily.    [provider]  Potassium Chloride ER 20 MEQ TBCR Take 2 tablets by mouth daily. 05/17/21   [provider]  QUEtiapine (SEROQUEL) 100 MG tablet Take 200 mg by mouth at bedtime.    [provider]  QUEtiapine (SEROQUEL) 50 MG tablet Take 50 mg by mouth in the morning.    [provider]  rosuvastatin (CRESTOR) 40 MG tablet Take 40 mg by mouth at bedtime. 02/13/22   [provider]  tiotropium (SPIRIVA) 18 MCG inhalation capsule Place 18 mcg into inhaler and inhale daily.    [provider]  traMADol (ULTRAM) 50 MG tablet Take 50 mg by mouth 2 (two) times daily as needed. 01/16/22   [provider]   Physical Exam: Vitals:   02/15/22 1500 02/15/22 1530 02/15/22 1600 02/15/22 1601  BP: (!) 102/58 106/60 119/66   Pulse: (!) 59 (!) 59 67   Resp: 17 19 (!) 23   Temp:    98 F (36.7 C)  TempSrc:    Oral  SpO2: 100% 100% 100%   Weight:      Height:       Constitutional: appears older than chronological age, chronically  ill, NAD, calm, comfortable Eyes: PERRL, lids and conjunctivae normal ENMT: Mucous membranes are moist. Posterior pharynx clear of any exudate or lesions. Age-appropriate dentition. Hearing appropriate Neck: normal, supple, no masses, no thyromegaly Respiratory: clear to auscultation bilaterally, no wheezing, no crackles. Normal respiratory effort. No accessory muscle use.  Cardiovascular: Regular rate and rhythm, no murmurs / rubs / gallops. No extremity edema. 2+ pedal pulses. No carotid bruits.  Abdomen: Obese abdomen, mild tenderness in epigastric and right upper quadrant, no masses palpated, no hepatosplenomegaly. Bowel sounds positive.  Musculoskeletal: no clubbing / cyanosis. No joint deformity upper and lower extremities. Good ROM, no contractures, no atrophy. Normal muscle tone.  Skin: no rashes, lesions, ulcers. No induration Neurologic: Sensation intact. Strength 5/5 in all 4.  Psychiatric: Normal judgment and insight. Alert and oriented x self. Normal mood.   EKG: independently reviewed, showing sinus rhythm with rate of 94, QTc 526  Chest x-ray on Admission: I personally reviewed and I agree with radiologist reading as below.  CT Angio Chest PE W and/or Wo Contrast  Result Date: 02/15/2022 CLINICAL DATA:  Positive D-dimer. Clinical suspicion for pulmonary embolism. EXAM: CT ANGIOGRAPHY CHEST WITH CONTRAST TECHNIQUE: Multidetector CT imaging of the chest was performed using the standard protocol during bolus administration of intravenous contrast. Multiplanar CT image reconstructions and MIPs were obtained to evaluate the vascular anatomy. RADIATION DOSE REDUCTION: This exam was performed according to the departmental dose-optimization program which includes automated exposure control, adjustment of the mA and/or kV according to patient size and/or use of iterative reconstruction technique. CONTRAST:  107mL OMNIPAQUE IOHEXOL 350 MG/ML SOLN COMPARISON:  Current and prior chest  radiographs. FINDINGS: Cardiovascular: Pulmonary arteries are well opacified. There is no evidence of a pulmonary embolism. Heart mildly enlarged. There changes from prior cardiac surgery and mitral valve replacement. No pericardial effusion. Great vessels are normal in caliber. Aorta not opacified. Aortic atherosclerotic calcifications. Mediastinum/Nodes: No neck base, mediastinal or hilar masses or enlarged lymph nodes. Trachea and esophagus are unremarkable. Lungs/Pleura: Mild dependent atelectasis, lower lobes and  left upper lobe lingula. Lungs otherwise clear. No pleural effusion. No pneumothorax. Upper Abdomen: No acute findings. Fat containing midline upper abdominal hernia. Aortic atherosclerosis. Musculoskeletal: No acute fracture or acute finding. Prior left clavicle fracture ORIF. No osteoblastic or osteolytic lesions. No chest mass. Review of the MIP images confirms the above findings. IMPRESSION: 1. No evidence of a pulmonary embolism. 2. No acute findings. 3. Cardiomegaly and changes from prior cardiac surgery. 4. Aortic atherosclerosis. Aortic Atherosclerosis (ICD10-I70.0). Electronically Signed   By: Lajean Manes M.D.   On: 02/15/2022 12:27   DG Chest Port 1 View  Result Date: 02/15/2022 CLINICAL DATA:  Dyspnea EXAM: PORTABLE CHEST 1 VIEW COMPARISON:  05/23/2021 chest radiograph. FINDINGS: Intact sternotomy wires. Stable configuration of 3 lead left subclavian ICD and cardiac valve prosthesis. Stable cardiomediastinal silhouette with mild cardiomegaly. No pneumothorax. No pleural effusion. Borderline mild pulmonary edema. IMPRESSION: Borderline mild congestive heart failure. Electronically Signed   By: Ilona Sorrel M.D.   On: 02/15/2022 07:58    Labs on Admission: I have personally reviewed following labs  CBC: Recent Labs  Lab 02/15/22 0736  WBC 10.3  NEUTROABS 6.5  HGB 13.7  HCT 42.4  MCV 88.9  PLT [id number]   Basic Metabolic Panel: Recent Labs  Lab 02/15/22 0736  NA 138  K 4.3   CL 106  CO2 20*  GLUCOSE 231*  BUN 24*  CREATININE 1.35*  CALCIUM 8.9   GFR: Estimated Creatinine Clearance: 57.8 mL/min (A) (by C-G formula based on SCr of 1.35 mg/dL (H)).  Liver Function Tests: Recent Labs  Lab 02/15/22 0736  AST 49*  ALT 32  ALKPHOS 82  BILITOT 1.0  PROT 7.0  ALBUMIN 3.4*   Urine analysis:    Component Value Date/Time   COLORURINE YELLOW (A) 02/15/2022 0900   APPEARANCEUR CLEAR (A) 02/15/2022 0900   LABSPEC 1.009 02/15/2022 0900   PHURINE 6.0 02/15/2022 0900   GLUCOSEU NEGATIVE 02/15/2022 0900   HGBUR NEGATIVE 02/15/2022 0900   BILIRUBINUR NEGATIVE 02/15/2022 0900   KETONESUR NEGATIVE 02/15/2022 0900   PROTEINUR 30 (A) 02/15/2022 0900   NITRITE NEGATIVE 02/15/2022 0900   LEUKOCYTESUR NEGATIVE 02/15/2022 0900   CRITICAL CARE Performed by: Jarissa Sheriff  Total critical care time: 35 minutes  Critical care time was exclusive of separately billable procedures and treating other patients.  Critical care was necessary to treat or prevent imminent or life-threatening deterioration.  Critical care was time spent personally by me on the following activities: development of treatment plan with patient and/or surrogate as well as nursing, discussions with consultants, evaluation of patient's response to treatment, examination of patient, obtaining history from patient or surrogate, ordering and performing treatments and interventions, ordering and review of laboratory studies, ordering and review of radiographic studies, pulse oximetry and re-evaluation of patient's condition.  Dr. Tobie Poet [facility]  If 7PM-7AM, please contact overnight-coverage provider If 7AM-7PM, please contact day coverage provider [email or web address]  02/15/2022, 4:35 PM

## 2022-02-15 NOTE — ED Notes (Signed)
Informed RN bed assigned 

## 2022-02-15 NOTE — ED Notes (Signed)
Informed rn bed assigned 

## 2022-02-15 NOTE — ED Notes (Signed)
Called lab to request add-on troponin using light green sent for procalcitonin.

## 2022-02-15 NOTE — Progress Notes (Signed)
[facility]  Bed placement changed from stepdown to PCU as bipap is currently at 45%.  Dr. Sedalia Muta

## 2022-02-15 NOTE — Assessment & Plan Note (Signed)
-   Status post CABG in 2017

## 2022-02-15 NOTE — ED Notes (Addendum)
Respiratory called and notified of transport to inpatient room

## 2022-02-15 NOTE — Consult Note (Addendum)
Cardiology Consultation:   Patient ID: Carl [name] MRN: [id number]; DOB: August 02, 1960  Admit date: 02/15/2022 Date of Consult: 02/15/2022  PCP:  Orvis Brill, [facility] Cardiologist:  Kathlyn Sacramento, MD  Electrophysiologist:  Virl Axe, MD  {    Patient Profile:   Carl [name] is a 61 y.o. male with a hx of CAD/CABG, who is being seen 02/15/2022 for the evaluation of shortness of breath at the request of Dr. Tobie Poet.  History of Present Illness:   Carl [name] is a 61 year old male with history of CAD/CABG (2017), ischemic cardiomyopathy EF 30 to 35%, s/p ICD 2017, COPD, paroxysmal atrial fibrillation on Eliquis, CVA, bipolar disorder, current smoker presenting with acute shortness of breath.  History taking limited by condition of patient as he is currently on BiPAP, very somnolent.  Patient lives in an assisted living facility.  Became acutely short of breath this morning with sats in the 80s.  EMS was called and he was placed on a nonrebreather with improvement in oxygenation to 92.  He was later started on CPAP, given albuterol neb treatment, Solu-Medrol per EMS en route to the ED.  Upon arrival in the emergency room, he was very short of breath, denies any chest pain and breathing very rapidly.  Was placed on BiPAP.  CT angio chest in the ED showed no evidence for pulmonary embolism, mild dependent atelectasis otherwise clear lungs.  Initial troponins 68, 102.  BNP 888, given IV Lasix 40 mg x 1 for possible CHF exacerbation.  Started on heparin IV in the ED for possible NSTEMI.  Patient sleeping comfortably upon my exam.   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 Authorizing Provider  acetaminophen (TYLENOL) 500 MG tablet Take 500 mg by mouth every 6 (six) hours as needed for mild pain.   Yes [provider]  albuterol (VENTOLIN HFA) 108 (90 Base) MCG/ACT inhaler Inhale 2 puffs into the lungs every 6 (six) hours as needed for wheezing. 05/24/19 04/28/22 Yes Clapacs, Madie Reno, MD  apixaban (ELIQUIS) 5 MG TABS tablet TAKE 1 TABLET BY MOUTH TWICE A DAY 01/18/21  Yes Deboraha Sprang, MD  atorvastatin (LIPITOR) 40 MG tablet Take 1 tablet (40 mg total) by mouth daily. Patient taking differently: Take 40 mg by mouth at bedtime as needed. 09/20/21 02/15/22 Yes Wellington Hampshire, MD  carvedilol (COREG) 3.125 MG tablet Take 3.125 mg by mouth 2 (two) times daily with a meal.   Yes [provider]  divalproex (DEPAKOTE ER) 500 MG 24 hr  tablet Take 1 tablet (500 mg total) by mouth 2 (two) times daily. 05/24/19  Yes Clapacs, Madie Reno, MD  escitalopram (LEXAPRO) 10 MG tablet Take 1 tablet (10 mg total) by mouth at bedtime. 05/24/19  Yes Clapacs, Madie Reno, MD  Fluticasone-Salmeterol (ADVAIR) 250-50 MCG/DOSE AEPB Inhale 1 puff into the lungs 2 (two) times daily. 05/26/21  Yes Wieting, Richard, MD  furosemide (LASIX) 40 MG tablet Take 1 tablet (40 mg total) by mouth daily. 08/01/20  Yes Deboraha Sprang, MD  hydrOXYzine (ATARAX) 10 MG tablet Take 10 mg by mouth 2 (two) times daily. 12/24/21  Yes [provider]   levETIRAcetam (KEPPRA) 250 MG tablet Take 1 tablet (250 mg total) by mouth 2 (two) times daily. 03/24/20  Yes Harvest Dark, MD  levothyroxine (SYNTHROID) 25 MCG tablet Take 1 tablet (25 mcg total) by mouth daily. 05/24/19  Yes Clapacs, Madie Reno, MD  lisinopril (ZESTRIL) 2.5 MG tablet TAKE 1 TABLET BY MOUTH ONCE DAILY 01/21/21  Yes Loel Dubonnet, NP  omeprazole (PRILOSEC) 20 MG capsule Take 20 mg by mouth daily.   Yes [provider]  QUEtiapine (SEROQUEL) 200 MG tablet Take 200 mg by mouth at bedtime.   Yes [provider]  tiotropium (SPIRIVA) 18 MCG inhalation capsule Place 18 mcg into inhaler and inhale daily.   Yes [provider]  traMADol (ULTRAM) 50 MG tablet Take 50 mg by mouth 2 (two) times daily as needed. 01/16/22  Yes [provider]  feeding supplement (ENSURE ENLIVE / ENSURE PLUS) LIQD Take 237 mLs by mouth 2 (two) times daily between meals. 05/26/21   Loletha Grayer, MD  Hydrocortisone Acetate 1 % CREA SMARTSIG:sparingly Topical 3 Times Daily 12/24/21   [provider]    Inpatient Medications: Scheduled Meds:  atorvastatin  40 mg Oral QHS   carvedilol  3.125 mg Oral BID WC   divalproex  500 mg Oral BID   escitalopram  10 mg Oral QHS   [START ON 02/16/2022] furosemide  40 mg Intravenous Daily   hydrOXYzine  10 mg Oral BID   levETIRAcetam  250 mg Oral BID   levothyroxine  25 mcg Oral Q0600   [START ON 02/16/2022] lisinopril  2.5 mg Oral Daily   mometasone-formoterol  2 puff Inhalation BID   QUEtiapine  100 mg Oral QHS   Continuous Infusions:  heparin Stopped (02/15/22 1458)   PRN Meds: acetaminophen **OR** acetaminophen, ipratropium-albuterol, nicotine, ondansetron **OR** ondansetron (ZOFRAN) IV, senna-docusate, traMADol  Allergies:    Allergies  Allergen Reactions   Iodinated Contrast Media Shortness Of Breath   Penicillins Anaphylaxis and Shortness Of Breath    Respiratory  Tolerated cefuroxime on 01/06/16    Strawberry Extract Anaphylaxis   Cefepime Itching    Empiric antibiotic, developed pruritis.    Erythromycin Itching   Sulfa Antibiotics Itching, Nausea And Vomiting and Nausea Only    Social History:   Social History   Socioeconomic History   Marital status: Widowed    Spouse name: Not on file   Number of children: Not on file   Years of education: Not on file   Highest education level: Not on file  Occupational History   Not on file  Tobacco Use   Smoking status: Every Day    Packs/day: 0.25    Years: 0.00    Total pack years: 0.00    Types: Cigarettes   Smokeless tobacco: Never  Vaping Use   Vaping Use: Never used  Substance and Sexual Activity  Alcohol use: Not Currently   Drug use: Never   Sexual activity: Not Currently    Birth control/protection: Abstinence  Other Topics Concern   Not on file  Social History Narrative   Not on file   Social Determinants of Health   Financial Resource Strain: Not on file  Food Insecurity: Not on file  Transportation Needs: Not on file  Physical Activity: Not on file  Stress: Not on file  Social Connections: Not on file  Intimate Partner Violence: Not on file    Family History:    Family History  Problem Relation Age of Onset   Cancer Mother        "femal cancer"   Prostate cancer Father      ROS:  Please see the history of present illness.   All other ROS reviewed and negative.     Physical Exam/Data:   Vitals:   02/15/22 1500 02/15/22 1530 02/15/22 1600 02/15/22 1601  BP: (!) 102/58 106/60 119/66   Pulse: (!) 59 (!) 59 67   Resp: 17 19 (!) 23   Temp:    98 F (36.7 C)  TempSrc:    Oral  SpO2: 100% 100% 100%   Weight:      Height:        Intake/Output Summary (Last 24 hours) at 02/15/2022 1622 Last data filed at 02/15/2022 1157 Gross per 24 hour  Intake --  Output 1130 ml  Net -1130 ml      02/15/2022    7:38 AM 09/20/2021   10:58 AM 08/21/2021   10:53 AM  Last 3 Weights  Weight (lbs) 191 lb  12.8 oz 185 lb 4 oz 189 lb  Weight (kg) 87 kg 84.029 kg 85.73 kg     Body mass index is 27.52 kg/m.  General:  Well nourished, well developed, in no acute distress, somnolent, not easily arousable HEENT: normal Neck: no JVD, BiPAP mask on Vascular: No carotid bruits; Distal pulses 2+ bilaterally Cardiac:  normal S1, S2; RRR; Lungs: Diminished breath sounds, no wheezing Abd: soft, nontender, no hepatomegaly  Ext: no edema Musculoskeletal:  No deformities, Skin: warm and dry  Neuro: Somnolent Psych: Unable to assess  EKG:  The EKG was personally reviewed and demonstrates: A sensed V paced rhythm Telemetry:  Telemetry was personally reviewed and demonstrates: A sensed V paced rhythm  Relevant CV Studies:  TTE 08/2020  1. Left ventricular ejection fraction, by estimation, is 30 to 35%. The  left ventricle has moderate to severely decreased function. The left  ventricle demonstrates global hypokinesis. The left ventricular internal  cavity size was moderately dilated.  Left ventricular diastolic parameters are consistent with Grade II  diastolic dysfunction (pseudonormalization).   2. Right ventricular systolic function is normal. The right ventricular  size is normal.   3. Left atrial size was severely dilated.   4. The mitral valve is degenerative. Mild to moderate mitral valve  regurgitation. Mild mitral stenosis. The mean mitral valve gradient is 4.0  mmHg.   5. The aortic valve is tricuspid. Aortic valve regurgitation is not  visualized.   6. The inferior vena cava is dilated in size with >50% respiratory  variability, suggesting right atrial pressure of 8 mmHg.   Laboratory Data:  High Sensitivity Troponin:   Recent Labs  Lab 02/15/22 0736 02/15/22 0739 02/15/22 0925  TROPONINIHS 65* 68* 102*     Chemistry Recent Labs  Lab 02/15/22 0736  NA 138  K 4.3  CL 106  CO2 20*  GLUCOSE 231*  BUN 24*  CREATININE 1.35*  CALCIUM 8.9  GFRNONAA 59*  ANIONGAP 12     Recent Labs  Lab 02/15/22 0736  PROT 7.0  ALBUMIN 3.4*  AST 49*  ALT 32  ALKPHOS 82  BILITOT 1.0   Lipids No results for input(s): "CHOL", "TRIG", "HDL", "LABVLDL", "LDLCALC", "CHOLHDL" in the last 168 hours.  Hematology Recent Labs  Lab 02/15/22 0736  WBC 10.3  RBC 4.77  HGB 13.7  HCT 42.4  MCV 88.9  MCH 28.7  MCHC 32.3  RDW 16.7*  PLT 184   Thyroid No results for input(s): "TSH", "FREET4" in the last 168 hours.  BNP Recent Labs  Lab 02/15/22 0739  BNP 888.4*    DDimer  Recent Labs  Lab 02/15/22 0736  DDIMER 0.63*     Radiology/Studies:  CT Angio Chest PE W and/or Wo Contrast  Result Date: 02/15/2022 CLINICAL DATA:  Positive D-dimer. Clinical suspicion for pulmonary embolism. EXAM: CT ANGIOGRAPHY CHEST WITH CONTRAST TECHNIQUE: Multidetector CT imaging of the chest was performed using the standard protocol during bolus administration of intravenous contrast. Multiplanar CT image reconstructions and MIPs were obtained to evaluate the vascular anatomy. RADIATION DOSE REDUCTION: This exam was performed according to the departmental dose-optimization program which includes automated exposure control, adjustment of the mA and/or kV according to patient size and/or use of iterative reconstruction technique. CONTRAST:  29mL OMNIPAQUE IOHEXOL 350 MG/ML SOLN COMPARISON:  Current and prior chest radiographs. FINDINGS: Cardiovascular: Pulmonary arteries are well opacified. There is no evidence of a pulmonary embolism. Heart mildly enlarged. There changes from prior cardiac surgery and mitral valve replacement. No pericardial effusion. Great vessels are normal in caliber. Aorta not opacified. Aortic atherosclerotic calcifications. Mediastinum/Nodes: No neck base, mediastinal or hilar masses or enlarged lymph nodes. Trachea and esophagus are unremarkable. Lungs/Pleura: Mild dependent atelectasis, lower lobes and left upper lobe lingula. Lungs otherwise clear. No pleural effusion. No  pneumothorax. Upper Abdomen: No acute findings. Fat containing midline upper abdominal hernia. Aortic atherosclerosis. Musculoskeletal: No acute fracture or acute finding. Prior left clavicle fracture ORIF. No osteoblastic or osteolytic lesions. No chest mass. Review of the MIP images confirms the above findings. IMPRESSION: 1. No evidence of a pulmonary embolism. 2. No acute findings. 3. Cardiomegaly and changes from prior cardiac surgery. 4. Aortic atherosclerosis. Aortic Atherosclerosis (ICD10-I70.0). Electronically Signed   By: Amie Portland M.D.   On: 02/15/2022 12:27   DG Chest Port 1 View  Result Date: 02/15/2022 CLINICAL DATA:  Dyspnea EXAM: PORTABLE CHEST 1 VIEW COMPARISON:  05/23/2021 chest radiograph. FINDINGS: Intact sternotomy wires. Stable configuration of 3 lead left subclavian ICD and cardiac valve prosthesis. Stable cardiomediastinal silhouette with mild cardiomegaly. No pneumothorax. No pleural effusion. Borderline mild pulmonary edema. IMPRESSION: Borderline mild congestive heart failure. Electronically Signed   By: Delbert Phenix M.D.   On: 02/15/2022 07:58     Assessment and Plan:   Shortness of breath, HFrEF -Etiology for shortness of breath likely combination of COPD and CHF -Clinically does not appear volume overloaded, chest CT with clear lungs, no edema on exam -Okay to continue Lasix 40 mg daily IV, monitor ins and out -Repeat echo ordered, previous echo was over a year ago. -Continue low-dose Coreg 3.125 mg twice daily, lisinopril 2.5 mg daily -History of hypotension with Entresto.  2.  Elevated troponins hx of CAD/CABG -Not consistent with ACS, likely demand supply mismatch -Continue to trend troponins until peaked -Okay to continue heparin for now due to  history of A-fib -I Do not anticipate any invasive work-up -Crestor  3.  Paroxysmal atrial fibrillation -Heart rate controlled -Continue Coreg, heparin -Restart Eliquis prior to discharge or upon stopping  heparin  4.  ICM s/p ICD -Functioning properly -EKG showing a sensed V paced rhythm  5.  Shortness of breath, COPD, current smoker -Possible COPD exacerbation -BiPAP, neb treatment as per primary team/pulmonary medicine -Smoking cessation recommended  Total encounter time more than 85 minutes  Greater than 50% was spent in counseling and coordination of care with the patient   Signed, Kate Sable, MD  02/15/2022 4:22 PM

## 2022-02-15 NOTE — Assessment & Plan Note (Addendum)
I suspect this is secondary to heparin GTT - We will repeat PTT - I ordered fibrinogen - Patient was negative for petechiae and bleeding from IV sites on admission physical exam and platelet is reassuring  - Discussed with nursing staff to let pharmacy know and if needed we will adjust heparin GTT rate - Repeat CBC in the a.m.

## 2022-02-15 NOTE — ED Provider Notes (Signed)
[facility] Provider Note    Event Date/Time   First MD Initiated Contact with Patient 02/15/22 [phone]     (approximate)   History   Shortness of Breath   HPI  Carl [name] is a 61 y.o. male who comes from nursing home by EMS.  Reportedly he became acutely short of breath this morning with sats in the 80s.  EMS got him up to 92% nonrebreather and then put him on CPAP.  He is still very short of breath only able to speak in 2 or 3 word sentences.  He denies anything hurting him.  He is breathing rapidly.  Sats are in the upper 90s on BiPAP now.  Patient had 2 albuterol nebs a DuoNeb 2 g of magnesium and Solu-Medrol with EMS.    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 --      Excl. in GC? --     Most recent vital signs: Vitals:   02/15/22 1130 02/15/22 1153  BP: (!) 140/73   Pulse: 73 77  Resp: (!) 27 (!) 23  Temp:    SpO2: 100% 94%     General: Awake, no respiratory  distress CV:  Good peripheral perfusion.  Heart regular rhythm and rate no audible murmurs Resp:  Increased effort tight diffuse wheezing even now Abd:  Some distention there is an upper midline abdominal wall hernia no tenderness Extremities with no pain swelling or edema   ED Results / Procedures / Treatments   Labs (all labs ordered are listed, but only abnormal results are displayed) Labs Reviewed  BRAIN NATRIURETIC PEPTIDE - Abnormal; Notable for the following components:      Result Value   B Natriuretic Peptide 888.4 (*)    All other components within normal limits  COMPREHENSIVE METABOLIC PANEL - Abnormal; Notable for the following components:   CO2 20 (*)    Glucose, Bld 231 (*)    BUN 24 (*)    Creatinine, Ser 1.35 (*)    Albumin 3.4 (*)    AST 49 (*)  GFR, Estimated 59 (*)    All other components within normal limits  LACTIC ACID, PLASMA - Abnormal; Notable for the following components:   Lactic Acid, Venous 3.6 (*)    All other components within normal limits  LACTIC ACID, PLASMA - Abnormal; Notable for the following components:   Lactic Acid, Venous 3.3 (*)    All other components within normal limits  CBC WITH DIFFERENTIAL/PLATELET - Abnormal; Notable for the following components:   RDW 16.7 (*)    Abs Immature Granulocytes 0.11 (*)    All other components within normal limits  D-DIMER, QUANTITATIVE - Abnormal; Notable for the following components:   D-Dimer, Quant 0.63 (*)    All other components within normal limits  BLOOD GAS, VENOUS - Abnormal; Notable for the following components:   pO2, Ven 106 (*)    Acid-base deficit 4.8 (*)    All other components within normal limits  URINALYSIS, ROUTINE W REFLEX MICROSCOPIC - Abnormal; Notable for the following components:   Color, Urine YELLOW (*)    APPearance CLEAR (*)    Protein, ur 30 (*)    All other components within normal limits  TROPONIN I (HIGH SENSITIVITY) - Abnormal; Notable for the following  components:   Troponin I (High Sensitivity) 65 (*)    All other components within normal limits  TROPONIN I (HIGH SENSITIVITY) - Abnormal; Notable for the following components:   Troponin I (High Sensitivity) 102 (*)    All other components within normal limits  RESP PANEL BY RT-PCR (FLU A&B, COVID) ARPGX2  APTT  PROTIME-INR  HEPARIN LEVEL (UNFRACTIONATED)     EKG  EKG shows sinus rhythm rate of 94 extreme left axis EKG appears to be fully paced no acute ST-T wave changes are seen   RADIOLOGY Chest x-ray read reviewed by me shows what appears to be some increased fluid markings.  Bedside ultrasound done by me shows a lot of search light pattern consistent with fluid.  Heart seems to be beating fairly well currently.   PROCEDURES:  Critical Care performed: Critical care time 35 minutes this includes seeing the patient interpreting his test during the bedside ultrasound helping him get a urinal taking the BiPAP off briefly so I could discuss with him his elevated troponin etc. let him know about the hospital doc admitting him and then him asking me to get him something to drink which I did.  I then got have another urinal and discussed him with the hospitalist.  Procedures   MEDICATIONS ORDERED IN ED: Medications  rosuvastatin (CRESTOR) tablet 40 mg (has no administration in time range)  acetaminophen (TYLENOL) tablet 650 mg (has no administration in time range)    Or  acetaminophen (TYLENOL) suppository 650 mg (has no administration in time range)  ondansetron (ZOFRAN) tablet 4 mg (has no administration in time range)    Or  ondansetron (ZOFRAN) injection 4 mg (has no administration in time range)  senna-docusate (Senokot-S) tablet 1 tablet (has no administration in time range)  furosemide (LASIX) injection 40 mg (has no administration in time range)  heparin ADULT infusion 100 units/mL (25000 units/211mL) (has no administration in time range)  levothyroxine (SYNTHROID)  tablet 25 mcg (has no administration in time range)  QUEtiapine (SEROQUEL) tablet 100 mg (has no administration in time range)  iohexol (OMNIPAQUE) 350 MG/ML injection 75 mL (has no administration in time range)  ipratropium-albuterol (DUONEB) 0.5-2.5 (3) MG/3ML nebulizer solution 3 mL (3 mLs Nebulization Given 02/15/22 0742)  ipratropium-albuterol (DUONEB)  0.5-2.5 (3) MG/3ML nebulizer solution 3 mL (3 mLs Nebulization Given 02/15/22 0743)  furosemide (LASIX) injection 40 mg (40 mg Intravenous Given 02/15/22 0816)  nitroGLYCERIN (NITROGLYN) 2 % ointment 0.5 inch (0.5 inches Topical Given 02/15/22 0812)  methylPREDNISolone sodium succinate (SOLU-MEDROL) 40 mg/mL injection 40 mg (40 mg Intravenous Given 02/15/22 0858)  diphenhydrAMINE (BENADRYL) capsule 50 mg ( Oral See Alternative 02/15/22 1148)    Or  diphenhydrAMINE (BENADRYL) injection 50 mg (50 mg Intravenous Given 02/15/22 1148)  aspirin chewable tablet 324 mg (324 mg Oral Given 02/15/22 1148)  heparin injection 4,000 Units (4,000 Units Intravenous Given 02/15/22 1150)     IMPRESSION / MDM / ASSESSMENT AND PLAN / ED COURSE  I reviewed the triage vital signs and the nursing notes. Patient getting DuoNebs without any response.  X-ray and bedside ultrasound seem to indicate more of a congestive heart failure pattern.  Will try some Lasix and some Nitropaste Lactic acid has come down slightly.  I feel is probably due to his respiratory distress has this improves as it is lactic acidosis should improve.  I really cannot give him fluids for it at this point. Differential diagnosis includes, but is not limited to, possibility of pulmonary embolus and of course his congestive heart failure which appears to be going on the rising troponin which may be due to an NSTEMI or more likely to strain from the congestive heart failure these are the most likely of many possibilities.  Patient's presentation is most consistent with acute presentation with potential  threat to life or bodily function.  The patient is on the cardiac monitor to evaluate for evidence of arrhythmia and/or significant heart rate changes none have been seen    FINAL CLINICAL IMPRESSION(S) / ED DIAGNOSES   Final diagnoses:  Dyspnea, unspecified type  Hypoxia  Elevated troponin  Elevated d-dimer  Congestive heart failure, unspecified HF chronicity, unspecified heart failure type (HCC)     Rx / DC Orders   ED Discharge Orders     None        Note:  This document was prepared using Dragon voice recognition software and may include unintentional dictation errors.   Arnaldo Natal, MD 02/15/22 [phone]

## 2022-02-15 NOTE — Hospital Course (Addendum)
[name] is a 61 year old male with history of CAD status post CABG in 2017, biventricular ICD presents status post upgrade on October 2021, atrial fibrillation on Eliquis, hyperlipidemia, hypothyroid, ckd3a, hypertension, heart failure reduced ejection fraction, persistent daily tobacco cigarette smoking, who presents emergency department for chief concerns of shortness of breath.  Per report he was found to be hypoxic with SpO2 in the 80s via EMS and was placed on CPAP and transferred to the emergency department for further evaluation.  Initial vitals in the emergency department showed temperature of 97.7, respiration rate of 31, heart rate of 85, blood pressure 134/88, SpO2 currently 100% on BiPAP.  VBG in the emergency department showed 7.3/44/106.  Serum sodium 138, potassium 4.3, chloride 106, bicarb 20, BUN of 24, serum creatinine of 1.35, GFR 59, nonfasting blood glucose 231.  His D-dimer was elevated at 0.63.  High sensitive troponin was 65 and increased to 102.  UA was negative for nitrates and leukocytes.  Lactic acid was elevated at 3.6 to 3.3.  EDP ordered a CTA of the chest to rule out PE.  ED treatment: Lasix 40 mg IV one-time dose, DuoNebs x 2, Solu-Medrol 40 mg IV one-time dose, nitroglycerin, Benadryl.

## 2022-02-15 NOTE — Assessment & Plan Note (Addendum)
On chronic systolic heart failure secondary to ischemic cardiomyopathy CTA of chest imaging read as changes from prior cardiac surgery and mitral valve replacement - Etiology workup in progress at this time - Check procalcitonin, COVID PCR - Complete echo ordered - Continue with BiPAP continuously - Patient endorses continued shortness of breath and/or chest pain, we will consider initiating nitroglycerin gtt. - Cardiology/[facility] has been consulted for heart failure medication optimization hospital evaluation for mitral valve integrity - Admit  to stepdown, inpatient

## 2022-02-15 NOTE — ED Notes (Addendum)
Transportation requested. Pt taken off BiPAP and placed on 4 liter's R[place].

## 2022-02-15 NOTE — ED Notes (Signed)
[facility] notified that PTT is >200 for redosing.Holding heparin at this time per pharmacy instruction- awaiting secure chat w/ further dosing information.

## 2022-02-15 NOTE — ED Notes (Signed)
Pt sleeping with Bipap in place. Respirations even and unlabored. 20 gauge IV on right forearm is patent, no swelling, redness, leaking, discoloration, or bleeding noted.

## 2022-02-15 NOTE — Assessment & Plan Note (Signed)
-   Lisinopril 2.5 mg p.o. daily resumed for 02/16/2022 - Coreg 3.125 mg p.o. twice daily resumed on day of admission - Furosemide 40 mg IV daily ordered for 2 doses starting on 02/16/2022

## 2022-02-15 NOTE — Assessment & Plan Note (Signed)
-   Nicotine as needed patch ordered ?

## 2022-02-15 NOTE — Assessment & Plan Note (Addendum)
-   Seroquel 100 milligrams nightly

## 2022-02-15 NOTE — Assessment & Plan Note (Addendum)
-   Depakote 500 mg p.o. twice daily and Keppra to 50 mg p.o. twice daily resumed

## 2022-02-15 NOTE — Assessment & Plan Note (Signed)
-   Status post ICD placement in 2017 with biventricular ICD upgrade in October 2021

## 2022-02-15 NOTE — ED Notes (Signed)
Pt placed in hospital gown and repositioned in bed. Pt is alert, oriented to self only. Pt able to move upper and lower extremities, strength equal bilaterally. Respirations even, pt is dyspneic w/ exertion and speaks in disconnected sentences. Inspiratory wheeze noted bilaterally. Pt given TV remote.

## 2022-02-15 NOTE — ED Notes (Signed)
Lung sounds clear bilaterally to auscultation.

## 2022-02-15 NOTE — Assessment & Plan Note (Signed)
Continue statin 

## 2022-02-15 NOTE — Assessment & Plan Note (Signed)
-   Right upper quadrant - Right upper quadrant ultrasound

## 2022-02-15 NOTE — Progress Notes (Signed)
ANTICOAGULATION CONSULT NOTE -   Pharmacy Consult for Heparin Drip Indication: chest pain/ACS  Allergies  Allergen Reactions   Iodinated Contrast Media Shortness Of Breath   Penicillins Anaphylaxis and Shortness Of Breath    Respiratory  Tolerated cefuroxime on 01/06/16   Strawberry Extract Anaphylaxis   Cefepime Itching    Empiric antibiotic, developed pruritis.    Erythromycin Itching   Sulfa Antibiotics Itching, Nausea And Vomiting and Nausea Only    Patient Measurements: Height: 5\' 10"  (177.8 cm) Weight: 87 kg (191 lb 12.8 oz) IBW/kg (Calculated) : 73 Heparin Dosing Weight: 87 kg  Vital Signs: Temp: 97.5 F (36.4 C) (08/11 1748) Temp Source: Oral (08/11 1748) BP: 116/67 (08/11 1748) Pulse Rate: 70 (08/11 1748)  Labs: Recent Labs    02/15/22 0736 02/15/22 0739 02/15/22 0925 02/15/22 1243 02/15/22 1821  HGB 13.7  --   --   --   --   HCT 42.4  --   --   --   --   PLT 184  --   --   --   --   APTT  --   --   --  >200* 35  LABPROT  --   --   --  16.5*  --   INR  --   --   --  1.3*  --   HEPARINUNFRC  --   --   --  >1.10*  --   CREATININE 1.35*  --   --   --   --   TROPONINIHS 65* 68* 102*  --   --      Estimated Creatinine Clearance: 57.8 mL/min (A) (by C-G formula based on SCr of 1.35 mg/dL (H)).   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 Troponin is trending up. Pharmacy consulted for Heparin dosing. Patient was taking Apixaban prior to admission.  Baseline HL: >1.10, aPTT>200  (note heparin bolus given 1150)  08/11 1821 aPTT=35  (note Heparin drip was paused at 1458)  will restart at 950 units/hr.    Goal of Therapy:  Heparin level 0.3-0.7 units/ml aPTT 66-102 seconds Monitor platelets by anticoagulation protocol: Yes   Plan:  Heparin 4000 units IV bolus Heparin 1100 units/hr infusion Since patient was taking Apixaban prior to admission and his baseline HL is elevated at >1.10 will need to use aPTT to guide dosing. Check aPTT in 6 hours Will check a daily HL to assess when aPTT and HL correlate Daily CBC while on Heparin infusion  08/11 1821 aPTT=35  (note Heparin drip was paused at 1458)  will restart at 950 units/hr. Check aPTT in 6 hrs CBC daily  10/11 PharmD Clinical Pharmacist 02/15/2022

## 2022-02-16 ENCOUNTER — Inpatient Hospital Stay: Payer: Medicaid Other

## 2022-02-16 ENCOUNTER — Inpatient Hospital Stay (HOSPITAL_COMMUNITY)
Admit: 2022-02-16 | Discharge: 2022-02-16 | Disposition: A | Discharge status: Home / Self Care | Payer: Medicaid Other | Attending: Internal Medicine | Admitting: Internal Medicine

## 2022-02-16 DIAGNOSIS — I5023 Acute on chronic systolic (congestive) heart failure: Secondary | ICD-10-CM

## 2022-02-16 DIAGNOSIS — R0609 Other forms of dyspnea: Secondary | ICD-10-CM | POA: Diagnosis not present

## 2022-02-16 DIAGNOSIS — R0600 Dyspnea, unspecified: Secondary | ICD-10-CM

## 2022-02-16 DIAGNOSIS — R778 Other specified abnormalities of plasma proteins: Secondary | ICD-10-CM

## 2022-02-16 DIAGNOSIS — I10 Essential (primary) hypertension: Secondary | ICD-10-CM

## 2022-02-16 DIAGNOSIS — I5043 Acute on chronic combined systolic (congestive) and diastolic (congestive) heart failure: Secondary | ICD-10-CM | POA: Diagnosis not present

## 2022-02-16 LAB — BASIC METABOLIC PANEL
Anion gap: 7 (ref 5–15)
BUN: 30 mg/dL — ABNORMAL HIGH (ref 8–23)
CO2: 26 mmol/L (ref 22–32)
Calcium: 9.5 mg/dL (ref 8.9–10.3)
Chloride: 103 mmol/L (ref 98–111)
Creatinine, Ser: 1.21 mg/dL (ref 0.61–1.24)
GFR, Estimated: >60 mL/min (ref >60)
Glucose, Bld: 121 mg/dL — ABNORMAL HIGH (ref 70–99)
Potassium: 4.2 mmol/L (ref 3.5–5.1)
Sodium: 136 mmol/L (ref 135–145)

## 2022-02-16 LAB — CBC
HCT: 36.7 % — ABNORMAL LOW (ref 39.0–52.0)
Hemoglobin: 12.2 g/dL — ABNORMAL LOW (ref 13.0–17.0)
MCH: 29.1 pg (ref 26.0–34.0)
MCHC: 33.2 g/dL (ref 30.0–36.0)
MCV: 87.6 fL (ref 80.0–100.0)
Platelets: 160 10*3/uL (ref 150–400)
RBC: 4.19 MIL/uL — ABNORMAL LOW (ref 4.22–5.81)
RDW: 16.3 % — ABNORMAL HIGH (ref 11.5–15.5)
WBC: 12 10*3/uL — ABNORMAL HIGH (ref 4.0–10.5)
nRBC: 0 % (ref 0.0–0.2)

## 2022-02-16 LAB — GLUCOSE, CAPILLARY: Glucose-Capillary: 188 mg/dL — ABNORMAL HIGH (ref 70–99)

## 2022-02-16 LAB — HEPARIN LEVEL (UNFRACTIONATED): Heparin Unfractionated: 0.64 IU/mL (ref 0.30–0.70)

## 2022-02-16 LAB — APTT
aPTT: 60 seconds — ABNORMAL HIGH (ref 24–36)
aPTT: 71 seconds — ABNORMAL HIGH (ref 24–36)
aPTT: 75 seconds — ABNORMAL HIGH (ref 24–36)
aPTT: 77 seconds — ABNORMAL HIGH (ref 24–36)

## 2022-02-16 MED ORDER — ARFORMOTEROL TARTRATE 15 MCG/2ML IN NEBU
15.0000 ug | INHALATION_SOLUTION | Freq: Two times a day (BID) | RESPIRATORY_TRACT | Status: DC
  Administered 2022-02-16 – 2022-02-20 (×9): 15 ug via RESPIRATORY_TRACT
  Filled 2022-02-16 (×9): qty 2

## 2022-02-16 MED ORDER — IPRATROPIUM-ALBUTEROL 0.5-2.5 (3) MG/3ML IN SOLN
3.0000 mL | Freq: Four times a day (QID) | RESPIRATORY_TRACT | Status: DC
  Administered 2022-02-16 – 2022-02-18 (×7): 3 mL via RESPIRATORY_TRACT
  Filled 2022-02-16 (×7): qty 3

## 2022-02-16 MED ORDER — PERFLUTREN LIPID MICROSPHERE
1.0000 mL | INTRAVENOUS | Status: AC | PRN
  Administered 2022-02-16: 2 mL via INTRAVENOUS

## 2022-02-16 MED ORDER — HEPARIN BOLUS VIA INFUSION
1300.0000 [IU] | Freq: Once | INTRAVENOUS | Status: AC
  Administered 2022-02-16: 1300 [IU] via INTRAVENOUS
  Filled 2022-02-16: qty 1300

## 2022-02-16 MED ORDER — BUDESONIDE 0.25 MG/2ML IN SUSP
0.2500 mg | Freq: Two times a day (BID) | RESPIRATORY_TRACT | Status: DC
  Administered 2022-02-16 – 2022-02-20 (×9): 0.25 mg via RESPIRATORY_TRACT
  Filled 2022-02-16 (×9): qty 2

## 2022-02-16 MED ORDER — METHYLPREDNISOLONE SODIUM SUCC 40 MG IJ SOLR
40.0000 mg | Freq: Once | INTRAMUSCULAR | Status: AC
  Administered 2022-02-16: 40 mg via INTRAVENOUS
  Filled 2022-02-16: qty 1

## 2022-02-16 NOTE — Progress Notes (Signed)
ANTICOAGULATION CONSULT NOTE  Pharmacy Consult for Heparin Drip Indication: chest pain/ACS  Allergies  Allergen Reactions   Iodinated Contrast Media Shortness Of Breath   Penicillins Anaphylaxis and Shortness Of Breath    Respiratory  Tolerated cefuroxime on 01/06/16   Strawberry Extract Anaphylaxis   Cefepime Itching    Empiric antibiotic, developed pruritis.    Erythromycin Itching   Sulfa Antibiotics Itching, Nausea And Vomiting and Nausea Only    Patient Measurements: Height: 5\' 10"  (177.8 cm) Weight: 87 kg (191 lb 12.8 oz) IBW/kg (Calculated) : 73 Heparin Dosing Weight: 87 kg  Vital Signs: Temp: 98 F (36.7 C) (08/12 0821) Temp Source: Oral (08/12 0423) BP: 160/125 (08/12 0821) Pulse Rate: 71 (08/12 0821)  Labs: Recent Labs    02/15/22 0736 02/15/22 0739 02/15/22 0925 02/15/22 1243 02/15/22 1243 02/15/22 1821 02/16/22 0049 02/16/22 0503 02/16/22 0926  HGB 13.7  --   --   --   --   --   --  12.2*  --   HCT 42.4  --   --   --   --   --   --  36.7*  --   PLT 184  --   --   --   --   --   --  160  --   APTT  --   --   --  >200*   < > 35 71*  --  60*  LABPROT  --   --   --  16.5*  --   --   --   --   --   INR  --   --   --  1.3*  --   --   --   --   --   HEPARINUNFRC  --   --   --  >1.10*  --   --   --   --  0.64  CREATININE 1.35*  --   --   --   --   --   --  1.21  --   TROPONINIHS 65* 68* 102*  --   --  83*  --   --   --    < > = values in this interval not displayed.     Estimated Creatinine Clearance: 64.5 mL/min (by C-G formula based on SCr of 1.21 mg/dL).   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 Troponin is trending up. Pharmacy consulted for Heparin dosing. Patient was taking Apixaban prior to admission. Baseline HL @1243 : >1.10, aPTT>200  (note heparin bolus given 1150)  08/11 1821 aPTT=35  (note Heparin drip was paused at 1458)  will restart at 950 units/hr. 8/12 0049 aPTT 71, therapeutic x 1 8/12 0926 aPTT 60, HL 0.64  subthera, 1300 u bolus and increase drip from 950 to  1100 u/hr   Goal of Therapy:  Heparin level 0.3-0.7 units/ml aPTT 66-102 seconds Monitor platelets by anticoagulation protocol: Yes   Plan:  8/12 0926 aPTT 60, HL 0.64  subtherapeutic, 1300 u bolus and increase drip from 950 to 1100 u/hr Since patient was taking Apixaban prior to admission and his baseline HL is elevated at >1.10 will need to use aPTT to guide dosing. reCheck aPTT in 6 hours Will check a daily HL to assess when aPTT and HL correlate Daily CBC while on Heparin infusion  Bari Mantis PharmD Clinical Pharmacist 02/16/2022

## 2022-02-16 NOTE — Progress Notes (Addendum)
Dr Duke Salvia is on the floor and made aware per ccmd patient has a 17 beat run vtach at 0750, currently paced on telemetry.

## 2022-02-16 NOTE — Progress Notes (Signed)
ANTICOAGULATION CONSULT NOTE  Pharmacy Consult for Heparin Drip Indication: chest pain/ACS  Allergies  Allergen Reactions   Iodinated Contrast Media Shortness Of Breath   Penicillins Anaphylaxis and Shortness Of Breath    Respiratory  Tolerated cefuroxime on 01/06/16   Strawberry Extract Anaphylaxis   Cefepime Itching    Empiric antibiotic, developed pruritis.    Erythromycin Itching   Sulfa Antibiotics Itching, Nausea And Vomiting and Nausea Only    Patient Measurements: Height: 5\' 10"  (177.8 cm) Weight: 87 kg (191 lb 12.8 oz) IBW/kg (Calculated) : 73 Heparin Dosing Weight: 87 kg  Vital Signs: Temp: 98.3 F (36.8 C) (08/12 0040) Temp Source: Oral (08/12 0040) BP: 125/72 (08/12 0040) Pulse Rate: 79 (08/12 0040)  Labs: Recent Labs    02/15/22 0736 02/15/22 0739 02/15/22 0925 02/15/22 1243 02/15/22 1821 02/16/22 0049  HGB 13.7  --   --   --   --   --   HCT 42.4  --   --   --   --   --   PLT 184  --   --   --   --   --   APTT  --   --   --  >200* 35 71*  LABPROT  --   --   --  16.5*  --   --   INR  --   --   --  1.3*  --   --   HEPARINUNFRC  --   --   --  >1.10*  --   --   CREATININE 1.35*  --   --   --   --   --   TROPONINIHS 65* 68* 102*  --  83*  --      Estimated Creatinine Clearance: 57.8 mL/min (A) (by C-G formula based on SCr of 1.35 mg/dL (H)).   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 Troponin is trending up. Pharmacy consulted for Heparin dosing. Patient was taking Apixaban prior to admission.  Baseline HL: >1.10, aPTT>200  (note heparin bolus given 1150)  08/11 1821 aPTT=35  (note Heparin drip was paused at 1458)  will restart at 950 units/hr. 8/12 0049 aPTT 71, therapeutic x 1  Goal of Therapy:  Heparin level 0.3-0.7 units/ml aPTT 66-102 seconds Monitor platelets by anticoagulation protocol: Yes   Plan:  Continue heparin infusion at 950 units/hr Since patient was taking Apixaban prior to admission and his baseline HL is elevated at >1.10 will need to use aPTT to guide dosing. Check aPTT in 6 hours Will check a daily HL to assess when aPTT and HL correlate Daily CBC while on Heparin infusion  10/12, PharmD, [facility] 02/16/2022 1:55 AM

## 2022-02-16 NOTE — Progress Notes (Signed)
*  PRELIMINARY RESULTS* Echocardiogram 2D Echocardiogram has been performed.  Joanette Gula Wende [place] 02/16/2022, 8:25 AM

## 2022-02-16 NOTE — Progress Notes (Signed)
PROGRESS NOTE    Carl [name]  DUK:[id number] DOB: June 20, 1961 DOA: 02/15/2022 PCP: Almetta Lovely, [facility]    Brief Narrative:  61 year old male with history of CAD status post CABG in 2017, biventricular ICD presents status post upgrade on October 2021, atrial fibrillation on Eliquis, hyperlipidemia, hypothyroid, ckd3a, hypertension, heart failure reduced ejection fraction, persistent daily tobacco cigarette smoking, who presents emergency department for chief concerns of shortness of breath.   Per report he was found to be hypoxic with SpO2 in the 80s via EMS and was placed on CPAP and transferred to the emergency department for further evaluation.  Weaned off CPAP.  Currently stable on nasal cannula.  Seen in consultation by cardiology.  Likely multifactorial presentation consistent with COPD and CHF.   Assessment & Plan:   Principal Problem:   Acute exacerbation of CHF (congestive heart failure) (HCC) Active Problems:   Elevated troponin   Bipolar 1 disorder (HCC)   Coronary artery disease   Essential hypertension   Hypothyroidism   Acute on chronic combined systolic (congestive) and diastolic (congestive) heart failure (HCC)   Seizure (HCC)   HLD (hyperlipidemia)   Ischemic cardiomyopathy   History of alcohol abuse   Peripheral artery disease (HCC)   Urinary retention due to benign prostatic hyperplasia   Chronic anticoagulation   Weakness   Anxiety   Tobacco use   Elevated partial thromboplastin time (PTT)   Abdominal pain   Dyspnea  Acute on chronic systolic congestive heart failure Ischemic cardiomyopathy status post ICD Suspect presentation is consistent with both CHF and COPD.  Has been weaned off of supplemental oxygen.  Cardiology [facility] consulted. Plan: Continue IV Lasix 40 mg daily Follow-up 2D echocardiogram Continue GDMT with Coreg and lisinopril  Decompensated COPD And presentation likely multifactorial owing to decompensated COPD and CHF.  COPD in  the setting of chronic tobacco abuse. Plan: Low-dose steroid Bronchodilator regimen DuoNebs every 6 hours Twice daily Brovana Twice daily Pulmicort  Acute hypoxic respiratory failure Initially required NIPPV, now weaned off Is on room air as of 8/12 Decompensated CHF and COPD as above  Elevated troponin CAD status post CABG Not entirely consistent with ACS Continue IV heparin x48 hours followed by transition back to home Eliquis 2D echocardiogram Lipitor and Coreg  Paroxysmal atrial fibrillation Currently on IV heparin x48 hours Rate controlled, telemetry monitoring Incision back to Eliquis after 2 days  Abdominal pain Located to right upper quadrant.  Unclear etiology.  Will evaluate with RUQ ultrasound  Tobacco use Nicotine as needed patch ordered   Anxiety Seroquel 100 milligrams nightly  HLD (hyperlipidemia) Rosuvastatin 40 mg nightly resumed   Hypothyroidism Levothyroxine 25 mcg daily resumed   Essential hypertension Lisinopril 2.5 mg p.o. daily resumed for 02/16/2022 Coreg 3.125 mg p.o. twice daily resumed on day of admission Furosemide 40 mg IV daily ordered for 2 doses starting on 02/16/2022  Bipolar 1 disorder (HCC) Depakote 500 mg p.o. twice daily and Keppra to 50 mg p.o. twice daily resumed   DVT prophylaxis: Heparin GTT Code Status: Full code Family Communication: None today.  Offered to call but patient declined Disposition Plan: Status is: Inpatient Remains inpatient appropriate because: Decompensated CHF and COPD   Level of care: Progressive  Consultants:  Cardiology-[facility]  Procedures:  None  Antimicrobials: None   Subjective: Seen and examined.  Shortness of breath improved.  No pain complaints  Objective: Vitals:   02/15/22 2126 02/16/22 0040 02/16/22 0423 02/16/22 0821  BP: 126/80 125/72 108/67 (!) 160/125  Pulse: 74  79 74 71  Resp: 20 20 20 18   Temp: 97.8 F (36.6 C) 98.3 F (36.8 C) 98.7 F (37.1 C) 98 F (36.7 C)   TempSrc: Oral Oral Oral   SpO2: 98% 95% 95% 96%  Weight:      Height:        Intake/Output Summary (Last 24 hours) at 02/16/2022 1209 Last data filed at 02/16/2022 1038 Gross per 24 hour  Intake 341.54 ml  Output 1450 ml  Net -1108.46 ml   Filed Weights   02/15/22 0738  Weight: 87 kg    Examination:  General exam: Appears calm and comfortable  Respiratory system: End expiratory wheeze.  Diffuse crackles.  Normal work of breathing.  Room air Cardiovascular system: S2, RRR, no murmurs, no pedal edema Gastrointestinal system: Soft, NT/ND, normal bowel sounds Central nervous system: Alert and oriented. No focal neurological deficits. Extremities: Symmetric 5 x 5 power. Skin: No rashes, lesions or ulcers Psychiatry: Judgement and insight appear normal. Mood & affect appropriate.     Data Reviewed: I have personally reviewed following labs and imaging studies  CBC: Recent Labs  Lab 02/15/22 0736 02/16/22 0503  WBC 10.3 12.0*  NEUTROABS 6.5  --   HGB 13.7 12.2*  HCT 42.4 36.7*  MCV 88.9 87.6  PLT 184 160   Basic Metabolic Panel: Recent Labs  Lab 02/15/22 0736 02/16/22 0503  NA 138 136  K 4.3 4.2  CL 106 103  CO2 20* 26  GLUCOSE 231* 121*  BUN 24* 30*  CREATININE 1.35* 1.21  CALCIUM 8.9 9.5   GFR: Estimated Creatinine Clearance: 64.5 mL/min (by C-G formula based on SCr of 1.21 mg/dL). Liver Function Tests: Recent Labs  Lab 02/15/22 0736  AST 49*  ALT 32  ALKPHOS 82  BILITOT 1.0  PROT 7.0  ALBUMIN 3.4*   No results for input(s): "LIPASE", "AMYLASE" in the last 168 hours. Recent Labs  Lab 02/15/22 1455  AMMONIA 21   Coagulation Profile: Recent Labs  Lab 02/15/22 1243  INR 1.3*   Cardiac Enzymes: No results for input(s): "CKTOTAL", "CKMB", "CKMBINDEX", "TROPONINI" in the last 168 hours. BNP (last 3 results) No results for input(s): "PROBNP" in the last 8760 hours. HbA1C: No results for input(s): "HGBA1C" in the last 72 hours. CBG: Recent  Labs  Lab 02/15/22 1152 02/16/22 0822  GLUCAP 167* 188*   Lipid Profile: No results for input(s): "CHOL", "HDL", "LDLCALC", "TRIG", "CHOLHDL", "LDLDIRECT" in the last 72 hours. Thyroid Function Tests: No results for input(s): "TSH", "T4TOTAL", "FREET4", "T3FREE", "THYROIDAB" in the last 72 hours. Anemia Panel: No results for input(s): "VITAMINB12", "FOLATE", "FERRITIN", "TIBC", "IRON", "RETICCTPCT" in the last 72 hours. Sepsis Labs: Recent Labs  Lab 02/15/22 0744 02/15/22 0925 02/15/22 1243  PROCALCITON  --   --  <0.10  LATICACIDVEN 3.6* 3.3*  --     Recent Results (from the past 240 hour(s))  Resp Panel by RT-PCR (Flu A&B, Covid) Anterior Nasal Swab     Status: None   Collection Time: 02/15/22  7:44 AM   Specimen: Anterior Nasal Swab  Result Value Ref Range Status   SARS Coronavirus 2 by RT PCR NEGATIVE NEGATIVE Final    Comment: (NOTE) SARS-CoV-2 target nucleic acids are NOT DETECTED.  The SARS-CoV-2 RNA is generally detectable in upper respiratory specimens during the acute phase of infection. The lowest concentration of SARS-CoV-2 viral copies this assay can detect is 138 copies/mL. A negative result does not preclude SARS-Cov-2 infection and should not be used as  the sole basis for treatment or other patient management decisions. A negative result may occur with  improper specimen collection/handling, submission of specimen other than nasopharyngeal swab, presence of viral mutation(s) within the areas targeted by this assay, and inadequate number of viral copies(<138 copies/mL). A negative result must be combined with clinical observations, patient history, and epidemiological information. The expected result is Negative.  Fact Sheet for Patients:  BloggerCourse.com  Fact Sheet for Healthcare Providers:  SeriousBroker.it  This test is no t yet approved or cleared by the [place] FDA and  has been authorized  for detection and/or diagnosis of SARS-CoV-2 by FDA under an Emergency Use Authorization (EUA). This EUA will remain  in effect (meaning this test can be used) for the duration of the COVID-19 declaration under Section 564(b)(1) of the Act, 21 U.S.C.section 360bbb-3(b)(1), unless the authorization is terminated  or revoked sooner.       Influenza A by PCR NEGATIVE NEGATIVE Final   Influenza B by PCR NEGATIVE NEGATIVE Final    Comment: (NOTE) The Xpert Xpress SARS-CoV-2/FLU/RSV plus assay is intended as an aid in the diagnosis of influenza from Nasopharyngeal swab specimens and should not be used as a sole basis for treatment. Nasal washings and aspirates are unacceptable for Xpert Xpress SARS-CoV-2/FLU/RSV testing.  Fact Sheet for Patients: BloggerCourse.com  Fact Sheet for Healthcare Providers: SeriousBroker.it  This test is not yet approved or cleared by the [place] FDA and has been authorized for detection and/or diagnosis of SARS-CoV-2 by FDA under an Emergency Use Authorization (EUA). This EUA will remain in effect (meaning this test can be used) for the duration of the COVID-19 declaration under Section 564(b)(1) of the Act, 21 U.S.C. section 360bbb-3(b)(1), unless the authorization is terminated or revoked.  Performed at [facility], [address] [place], [place] [address]   SARS Coronavirus 2 by RT PCR (hospital order, performed in [facility] hospital lab) *cepheid single result test* Anterior Nasal Swab     Status: None   Collection Time: 02/15/22 12:43 PM   Specimen: Anterior Nasal Swab  Result Value Ref Range Status   SARS Coronavirus 2 by RT PCR NEGATIVE NEGATIVE Final    Comment: (NOTE) SARS-CoV-2 target nucleic acids are NOT DETECTED.  The SARS-CoV-2 RNA is generally detectable in upper and lower respiratory specimens during the acute phase of infection. The lowest concentration of  SARS-CoV-2 viral copies this assay can detect is 250 copies / mL. A negative result does not preclude SARS-CoV-2 infection and should not be used as the sole basis for treatment or other patient management decisions.  A negative result may occur with improper specimen collection / handling, submission of specimen other than nasopharyngeal swab, presence of viral mutation(s) within the areas targeted by this assay, and inadequate number of viral copies (<250 copies / mL). A negative result must be combined with clinical observations, patient history, and epidemiological information.  Fact Sheet for Patients:   RoadLapTop.co.za  Fact Sheet for Healthcare Providers: http://kim-miller.com/  This test is not yet approved or  cleared by the [place] FDA and has been authorized for detection and/or diagnosis of SARS-CoV-2 by FDA under an Emergency Use Authorization (EUA).  This EUA will remain in effect (meaning this test can be used) for the duration of the COVID-19 declaration under Section 564(b)(1) of the Act, 21 U.S.C. section 360bbb-3(b)(1), unless the authorization is terminated or revoked sooner.  Performed at [facility], [address] [place], [place] [address]  Radiology Studies: CT Angio Chest PE W and/or Wo Contrast  Result Date: 02/15/2022 CLINICAL DATA:  Positive D-dimer. Clinical suspicion for pulmonary embolism. EXAM: CT ANGIOGRAPHY CHEST WITH CONTRAST TECHNIQUE: Multidetector CT imaging of the chest was performed using the standard protocol during bolus administration of intravenous contrast. Multiplanar CT image reconstructions and MIPs were obtained to evaluate the vascular anatomy. RADIATION DOSE REDUCTION: This exam was performed according to the departmental dose-optimization program which includes automated exposure control, adjustment of the mA and/or kV according to patient size and/or use of  iterative reconstruction technique. CONTRAST:  63mL OMNIPAQUE IOHEXOL 350 MG/ML SOLN COMPARISON:  Current and prior chest radiographs. FINDINGS: Cardiovascular: Pulmonary arteries are well opacified. There is no evidence of a pulmonary embolism. Heart mildly enlarged. There changes from prior cardiac surgery and mitral valve replacement. No pericardial effusion. Great vessels are normal in caliber. Aorta not opacified. Aortic atherosclerotic calcifications. Mediastinum/Nodes: No neck base, mediastinal or hilar masses or enlarged lymph nodes. Trachea and esophagus are unremarkable. Lungs/Pleura: Mild dependent atelectasis, lower lobes and left upper lobe lingula. Lungs otherwise clear. No pleural effusion. No pneumothorax. Upper Abdomen: No acute findings. Fat containing midline upper abdominal hernia. Aortic atherosclerosis. Musculoskeletal: No acute fracture or acute finding. Prior left clavicle fracture ORIF. No osteoblastic or osteolytic lesions. No chest mass. Review of the MIP images confirms the above findings. IMPRESSION: 1. No evidence of a pulmonary embolism. 2. No acute findings. 3. Cardiomegaly and changes from prior cardiac surgery. 4. Aortic atherosclerosis. Aortic Atherosclerosis (ICD10-I70.0). Electronically Signed   By: Amie Portland M.D.   On: 02/15/2022 12:27   DG Chest Port 1 View  Result Date: 02/15/2022 CLINICAL DATA:  Dyspnea EXAM: PORTABLE CHEST 1 VIEW COMPARISON:  05/23/2021 chest radiograph. FINDINGS: Intact sternotomy wires. Stable configuration of 3 lead left subclavian ICD and cardiac valve prosthesis. Stable cardiomediastinal silhouette with mild cardiomegaly. No pneumothorax. No pleural effusion. Borderline mild pulmonary edema. IMPRESSION: Borderline mild congestive heart failure. Electronically Signed   By: Delbert Phenix M.D.   On: 02/15/2022 07:58        Scheduled Meds:  arformoterol  15 mcg Nebulization BID   atorvastatin  40 mg Oral QHS   budesonide (PULMICORT)  nebulizer solution  0.25 mg Nebulization BID   carvedilol  3.125 mg Oral BID WC   divalproex  500 mg Oral BID   escitalopram  10 mg Oral QHS   furosemide  40 mg Intravenous Daily   hydrOXYzine  10 mg Oral BID   ipratropium-albuterol  3 mL Nebulization Q6H   levETIRAcetam  250 mg Oral BID   levothyroxine  25 mcg Oral Q0600   lisinopril  2.5 mg Oral Daily   QUEtiapine  100 mg Oral QHS   Continuous Infusions:  heparin 1,100 Units/hr (02/16/22 1038)     LOS: 1 day      Tresa Moore, MD [facility]   If 7PM-7AM, please contact night-coverage  02/16/2022, 12:09 PM

## 2022-02-16 NOTE — Progress Notes (Signed)
Progress Note  Patient Name: Carl [name] Date of Encounter: 02/16/2022  [facility] Cardiologist: Kathlyn Sacramento, MD   Subjective   Patient is sleeping during rounds. UOP -1.6L.   Inpatient Medications    Scheduled Meds:  atorvastatin  40 mg Oral QHS   carvedilol  3.125 mg Oral BID WC   divalproex  500 mg Oral BID   escitalopram  10 mg Oral QHS   furosemide  40 mg Intravenous Daily   hydrOXYzine  10 mg Oral BID   levETIRAcetam  250 mg Oral BID   levothyroxine  25 mcg Oral Q0600   lisinopril  2.5 mg Oral Daily   mometasone-formoterol  2 puff Inhalation BID   QUEtiapine  100 mg Oral QHS   Continuous Infusions:  heparin 950 Units/hr (02/15/22 1916)   PRN Meds: acetaminophen **OR** acetaminophen, ipratropium-albuterol, nicotine, ondansetron **OR** ondansetron (ZOFRAN) IV, senna-docusate, traMADol   Vital Signs    Vitals:   02/15/22 1830 02/15/22 2126 02/16/22 0040 02/16/22 0423  BP:  126/80 125/72 108/67  Pulse:  74 79 74  Resp:  _0 Temp:  97.8 F (36.6 C) 98.3 F (36.8 C) 98.7 F (37.1 C)  TempSrc:  Oral Oral Oral  SpO2: 99% 98% 95% 95%  Weight:      Height:        Intake/Output Summary (Last 24 hours) at 02/16/2022 0706 Last data filed at 02/16/2022 0432 Gross per 24 hour  Intake 341.54 ml  Output 1980 ml  Net -1638.46 ml      02/15/2022    7:38 AM 09/20/2021   10:58 AM 08/21/2021   10:53 AM  Last 3 Weights  Weight (lbs) 191 lb 12.8 oz 185 lb 4 oz 189 lb  Weight (kg) 87 kg 84.029 kg 85.73 kg      Telemetry    AV pacing with PVCs, HR 70s - Personally Reviewed  ECG    No new - Personally Reviewed  Physical Exam   GEN: No acute distress.   Neck: No JVD Cardiac: RRR, no murmurs, rubs, or gallops.  Respiratory: wheezing, course sounds. GI: Soft, nontender, non-distended  MS: No edema; No deformity. Neuro:  Nonfocal  Psych: Normal affect   Labs    High Sensitivity Troponin:   Recent Labs  Lab 02/15/22 0736 02/15/22 0739  02/15/22 0925 02/15/22 1821  TROPONINIHS 65* 68* 102* 83*     Chemistry Recent Labs  Lab 02/15/22 0736 02/16/22 0503  NA 138 136  K 4.3 4.2  CL 106 103  CO2 20* 26  GLUCOSE 231* 121*  BUN 24* 30*  CREATININE 1.35* 1.21  CALCIUM 8.9 9.5  PROT 7.0  --   ALBUMIN 3.4*  --   AST 49*  --   ALT 32  --   ALKPHOS 82  --   BILITOT 1.0  --   GFRNONAA 59* >60  ANIONGAP 12 7    Lipids No results for input(s): "CHOL", "TRIG", "HDL", "LABVLDL", "LDLCALC", "CHOLHDL" in the last 168 hours.  Hematology Recent Labs  Lab 02/15/22 0736 02/16/22 0503  WBC 10.3 12.0*  RBC 4.77 4.19*  HGB 13.7 12.2*  HCT 42.4 36.7*  MCV 88.9 87.6  MCH 28.7 29.1  MCHC 32.3 33.2  RDW 16.7* 16.3*  PLT 184 160   Thyroid No results for input(s): "TSH", "FREET4" in the last 168 hours.  BNP Recent Labs  Lab 02/15/22 0739  BNP 888.4*    DDimer  Recent Labs  Lab 02/15/22 [phone]  DDIMER 0.63*     Radiology    CT Angio Chest PE W and/or Wo Contrast  Result Date: 02/15/2022 CLINICAL DATA:  Positive D-dimer. Clinical suspicion for pulmonary embolism. EXAM: CT ANGIOGRAPHY CHEST WITH CONTRAST TECHNIQUE: Multidetector CT imaging of the chest was performed using the standard protocol during bolus administration of intravenous contrast. Multiplanar CT image reconstructions and MIPs were obtained to evaluate the vascular anatomy. RADIATION DOSE REDUCTION: This exam was performed according to the departmental dose-optimization program which includes automated exposure control, adjustment of the mA and/or kV according to patient size and/or use of iterative reconstruction technique. CONTRAST:  39m OMNIPAQUE IOHEXOL 350 MG/ML SOLN COMPARISON:  Current and prior chest radiographs. FINDINGS: Cardiovascular: Pulmonary arteries are well opacified. There is no evidence of a pulmonary embolism. Heart mildly enlarged. There changes from prior cardiac surgery and mitral valve replacement. No pericardial effusion. Great vessels  are normal in caliber. Aorta not opacified. Aortic atherosclerotic calcifications. Mediastinum/Nodes: No neck base, mediastinal or hilar masses or enlarged lymph nodes. Trachea and esophagus are unremarkable. Lungs/Pleura: Mild dependent atelectasis, lower lobes and left upper lobe lingula. Lungs otherwise clear. No pleural effusion. No pneumothorax. Upper Abdomen: No acute findings. Fat containing midline upper abdominal hernia. Aortic atherosclerosis. Musculoskeletal: No acute fracture or acute finding. Prior left clavicle fracture ORIF. No osteoblastic or osteolytic lesions. No chest mass. Review of the MIP images confirms the above findings. IMPRESSION: 1. No evidence of a pulmonary embolism. 2. No acute findings. 3. Cardiomegaly and changes from prior cardiac surgery. 4. Aortic atherosclerosis. Aortic Atherosclerosis (ICD10-I70.0). Electronically Signed   By: DLajean ManesM.D.   On: 02/15/2022 12:27   DG Chest Port 1 View  Result Date: 02/15/2022 CLINICAL DATA:  Dyspnea EXAM: PORTABLE CHEST 1 VIEW COMPARISON:  05/23/2021 chest radiograph. FINDINGS: Intact sternotomy wires. Stable configuration of 3 lead left subclavian ICD and cardiac valve prosthesis. Stable cardiomediastinal silhouette with mild cardiomegaly. No pneumothorax. No pleural effusion. Borderline mild pulmonary edema. IMPRESSION: Borderline mild congestive heart failure. Electronically Signed   By: JIlona SorrelM.D.   On: 02/15/2022 07:58    Cardiac Studies   Echo ordered  Echo 2022  1. Left ventricular ejection fraction, by estimation, is 30 to 35%. The  left ventricle has moderate to severely decreased function. The left  ventricle demonstrates global hypokinesis. The left ventricular internal  cavity size was moderately dilated.  Left ventricular diastolic parameters are consistent with Grade II  diastolic dysfunction (pseudonormalization).   2. Right ventricular systolic function is normal. The right ventricular  size is  normal.   3. Left atrial size was severely dilated.   4. The mitral valve is degenerative. Mild to moderate mitral valve  regurgitation. Mild mitral stenosis. The mean mitral valve gradient is 4.0  mmHg.   5. The aortic valve is tricuspid. Aortic valve regurgitation is not  visualized.   6. The inferior vena cava is dilated in size with >50% respiratory  variability, suggesting right atrial pressure of 8 mmHg.   Comparison(s): EF 25-30%, global hypokinesis, mild-moderate dilated LA and  RA, moderate MAC posterior leaflet.   Myoview Lexsican 12/2019 Narrative & Impression  There was no ST segment deviation noted during stress. No T wave inversion was noted during stress. Defect 1: There is a large defect of severe severity present in the mid anterior, mid inferior, mid inferolateral, apical anterior, apical inferior and apex location. Findings consistent with prior myocardial infarction in the mid to distal LAD and RCA/left circumflex. No significant reversibility.  This is a high risk study. Nuclear stress EF: 23%.    Patient Profile     61 y.o. male with a h/o CAD s/p CABG, ICM EF 30-35%, s/p ICD 2017, COPD, paroxysmal Afib on Eliquis, CVA, bipolar disorder, tobacco use who is being seen for SOB.   Assessment & Plan    Shortness of breath HFrEF ICM s/p ICD - SOB multifactorial given COPD and CHF, requiring Bipap - long history of ICM - CT chest without CHF - BNP elevated to 800 - IV lasix 79m daily - UOP -1.6L - kidney function better today - Echo pending - continue coreg and lisinopril. BP soft at times, limiting GDMT  Elevated troponin CAD s/p CABG - HS troponin peak 102, not consistent with ACS - IV heparin, can continuex48 hours - echo as above - do not suspect we will pursue invasive work-up - continue Lipitor  and Coreg  Paroxysmal Afib - IV heparin - coreg for rate control - transition back to Eliquis    For questions or updates, please contact [facility] Please consult [email or web address] for contact info under        Signed, Navah Grondin HNinfa Meeker PA-C  02/16/2022, 7:06 AM

## 2022-02-16 NOTE — Progress Notes (Signed)
ANTICOAGULATION CONSULT NOTE  Pharmacy Consult for Heparin Drip Indication: chest pain/ACS  Allergies  Allergen Reactions   Iodinated Contrast Media Shortness Of Breath   Penicillins Anaphylaxis and Shortness Of Breath    Respiratory  Tolerated cefuroxime on 01/06/16   Strawberry Extract Anaphylaxis   Cefepime Itching    Empiric antibiotic, developed pruritis.    Erythromycin Itching   Sulfa Antibiotics Itching, Nausea And Vomiting and Nausea Only    Patient Measurements: Height: 5\' 10"  (177.8 cm) Weight: 87 kg (191 lb 12.8 oz) IBW/kg (Calculated) : 73 Heparin Dosing Weight: 87 kg  Vital Signs: Temp: 97.7 F (36.5 C) (08/12 1643) Temp Source: Oral (08/12 1643) BP: 118/69 (08/12 1643) Pulse Rate: 79 (08/12 1643)  Labs: Recent Labs    02/15/22 0736 02/15/22 0739 02/15/22 0925 02/15/22 1243 02/15/22 1243 02/15/22 1821 02/16/22 0049 02/16/22 0503 02/16/22 0926 02/16/22 1735  HGB 13.7  --   --   --   --   --   --  12.2*  --   --   HCT 42.4  --   --   --   --   --   --  36.7*  --   --   PLT 184  --   --   --   --   --   --  160  --   --   APTT  --   --   --  >200*   < > 35 71*  --  60* 75*  LABPROT  --   --   --  16.5*  --   --   --   --   --   --   INR  --   --   --  1.3*  --   --   --   --   --   --   HEPARINUNFRC  --   --   --  >1.10*  --   --   --   --  0.64  --   CREATININE 1.35*  --   --   --   --   --   --  1.21  --   --   TROPONINIHS 65* 68* 102*  --   --  83*  --   --   --   --    < > = values in this interval not displayed.     Estimated Creatinine Clearance: 64.5 mL/min (by C-G formula based on SCr of 1.21 mg/dL).   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 Troponin is trending up. Pharmacy consulted for Heparin dosing. Patient was taking Apixaban prior to admission. Baseline HL @1243 : >1.10, aPTT>200  (note heparin bolus given 1150)  08/11 1821 aPTT=35  (note Heparin drip was paused at 1458)  will restart  at 950 units/hr. 8/12 0049 aPTT 71, therapeutic x 1 8/12 0926 aPTT 60, HL 0.64  subthera, 1300 u bolus and increase drip from 950 to 1100 u/hr 8/12 1735 aPTT 75,  therapeutic x 1   Goal of Therapy:  Heparin level 0.3-0.7 units/ml aPTT 66-102 seconds Monitor platelets by anticoagulation protocol: Yes   Plan:   8/12 1735 aPTT 75,  therapeutic x 1  continue at 1100 u/hr Since patient was taking Apixaban prior to admission and his baseline HL is elevated at >1.10 will need to use aPTT to guide dosing. Check confirmatory aPTT in 6 hours Will check a daily HL to assess when aPTT and HL correlate Daily CBC while on Heparin infusion  Bari Mantis PharmD Clinical Pharmacist 02/16/2022

## 2022-02-17 DIAGNOSIS — E785 Hyperlipidemia, unspecified: Secondary | ICD-10-CM

## 2022-02-17 DIAGNOSIS — I5043 Acute on chronic combined systolic (congestive) and diastolic (congestive) heart failure: Secondary | ICD-10-CM

## 2022-02-17 DIAGNOSIS — J441 Chronic obstructive pulmonary disease with (acute) exacerbation: Secondary | ICD-10-CM

## 2022-02-17 DIAGNOSIS — Z7901 Long term (current) use of anticoagulants: Secondary | ICD-10-CM

## 2022-02-17 DIAGNOSIS — I2510 Atherosclerotic heart disease of native coronary artery without angina pectoris: Secondary | ICD-10-CM | POA: Diagnosis not present

## 2022-02-17 DIAGNOSIS — I5023 Acute on chronic systolic (congestive) heart failure: Secondary | ICD-10-CM | POA: Diagnosis not present

## 2022-02-17 LAB — ECHOCARDIOGRAM COMPLETE
AR max vel: 2.49 cm2
AV Area VTI: 2.54 cm2
AV Area mean vel: 2.59 cm2
AV Mean grad: 3 mmHg
AV Peak grad: 6 mmHg
Ao pk vel: 1.22 m/s
Area-P 1/2: 3.16 cm2
Calc EF: 34 %
Height: 70 in
MV M vel: 4.21 m/s
MV Peak grad: 70.9 mmHg
S' Lateral: 4.79 cm
Single Plane A2C EF: 35.2 %
Single Plane A4C EF: 36.1 %
Weight: 3068.8 oz

## 2022-02-17 LAB — BASIC METABOLIC PANEL
Anion gap: 11 (ref 5–15)
BUN: 41 mg/dL — ABNORMAL HIGH (ref 8–23)
CO2: 27 mmol/L (ref 22–32)
Calcium: 9.8 mg/dL (ref 8.9–10.3)
Chloride: 99 mmol/L (ref 98–111)
Creatinine, Ser: 1.33 mg/dL — ABNORMAL HIGH (ref 0.61–1.24)
GFR, Estimated: >60 mL/min (ref >60)
Glucose, Bld: 108 mg/dL — ABNORMAL HIGH (ref 70–99)
Potassium: 4.2 mmol/L (ref 3.5–5.1)
Sodium: 137 mmol/L (ref 135–145)

## 2022-02-17 LAB — CBC
HCT: 36.8 % — ABNORMAL LOW (ref 39.0–52.0)
Hemoglobin: 12.3 g/dL — ABNORMAL LOW (ref 13.0–17.0)
MCH: 29.1 pg (ref 26.0–34.0)
MCHC: 33.4 g/dL (ref 30.0–36.0)
MCV: 87.2 fL (ref 80.0–100.0)
Platelets: 170 10*3/uL (ref 150–400)
RBC: 4.22 MIL/uL (ref 4.22–5.81)
RDW: 16.5 % — ABNORMAL HIGH (ref 11.5–15.5)
WBC: 14.6 10*3/uL — ABNORMAL HIGH (ref 4.0–10.5)
nRBC: 0 % (ref 0.0–0.2)

## 2022-02-17 LAB — APTT: aPTT: 77 seconds — ABNORMAL HIGH (ref 24–36)

## 2022-02-17 LAB — MAGNESIUM: Magnesium: 2.1 mg/dL (ref 1.7–2.4)

## 2022-02-17 LAB — HEPARIN LEVEL (UNFRACTIONATED): Heparin Unfractionated: 0.44 IU/mL (ref 0.30–0.70)

## 2022-02-17 MED ORDER — METHYLPREDNISOLONE SODIUM SUCC 40 MG IJ SOLR
40.0000 mg | INTRAMUSCULAR | Status: DC
  Administered 2022-02-17 – 2022-02-19 (×3): 40 mg via INTRAVENOUS
  Filled 2022-02-17 (×3): qty 1

## 2022-02-17 MED ORDER — FUROSEMIDE 40 MG PO TABS
40.0000 mg | ORAL_TABLET | Freq: Every day | ORAL | Status: DC
  Administered 2022-02-18 – 2022-02-20 (×3): 40 mg via ORAL
  Filled 2022-02-17 (×3): qty 1

## 2022-02-17 MED ORDER — APIXABAN 5 MG PO TABS
5.0000 mg | ORAL_TABLET | Freq: Two times a day (BID) | ORAL | Status: DC
  Administered 2022-02-17 – 2022-02-20 (×7): 5 mg via ORAL
  Filled 2022-02-17 (×7): qty 1

## 2022-02-17 NOTE — Progress Notes (Signed)
ANTICOAGULATION CONSULT NOTE  Pharmacy Consult for Heparin Drip Indication: chest pain/ACS  Allergies  Allergen Reactions   Iodinated Contrast Media Shortness Of Breath   Penicillins Anaphylaxis and Shortness Of Breath    Respiratory  Tolerated cefuroxime on 01/06/16   Strawberry Extract Anaphylaxis   Cefepime Itching    Empiric antibiotic, developed pruritis.    Erythromycin Itching   Sulfa Antibiotics Itching, Nausea And Vomiting and Nausea Only    Patient Measurements: Height: 5\' 10"  (177.8 cm) Weight: 87 kg (191 lb 12.8 oz) IBW/kg (Calculated) : 73 Heparin Dosing Weight: 87 kg  Vital Signs: Temp: 97.7 F (36.5 C) (08/12 1643) Temp Source: Oral (08/12 1643) BP: 141/76 (08/12 2200) Pulse Rate: 79 (08/12 1643)  Labs: Recent Labs    02/15/22 0736 02/15/22 0739 02/15/22 0925 02/15/22 1243 02/15/22 1243 02/15/22 1821 02/16/22 0049 02/16/22 0503 02/16/22 0926 02/16/22 1735 02/16/22 2330  HGB 13.7  --   --   --   --   --   --  12.2*  --   --   --   HCT 42.4  --   --   --   --   --   --  36.7*  --   --   --   PLT 184  --   --   --   --   --   --  160  --   --   --   APTT  --   --   --  >200*   < > 35   < >  --  60* 75* 77*  LABPROT  --   --   --  16.5*  --   --   --   --   --   --   --   INR  --   --   --  1.3*  --   --   --   --   --   --   --   HEPARINUNFRC  --   --   --  >1.10*  --   --   --   --  0.64  --   --   CREATININE 1.35*  --   --   --   --   --   --  1.21  --   --   --   TROPONINIHS 65* 68* 102*  --   --  83*  --   --   --   --   --    < > = values in this interval not displayed.     Estimated Creatinine Clearance: 64.5 mL/min (by C-G formula based on SCr of 1.21 mg/dL).   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 Troponin is trending up. Pharmacy consulted for Heparin dosing. Patient was taking  Apixaban prior to admission. Baseline HL @1243 : >1.10, aPTT>200  (note heparin bolus given 1150)  08/11 1821 aPTT=35  (note Heparin drip was paused at 1458)  will restart at 950 units/hr. 8/12 0049 aPTT 71, therapeutic x 1 8/12 0926 aPTT 60, HL 0.64  subthera, 1300 u bolus and increase drip from 950 to 1100 u/hr 8/12 1735 aPTT 75,  therapeutic x 1 8/12 2330 aPTT 77, therapeutic x 2   Goal of Therapy:  Heparin level 0.3-0.7 units/ml aPTT 66-102 seconds Monitor platelets by anticoagulation protocol: Yes   Plan:  Continue heparin infusion at 1100 u/hr Since patient was taking Apixaban prior to admission and his baseline HL is elevated at >1.10 will need to use aPTT to guide dosing. Recheck aPTT daily w/ AM labs while therapeutic  Will check a daily HL to assess when aPTT and HL correlate Daily CBC while on Heparin infusion  10/12, PharmD, [facility] 02/17/2022 12:08 AM

## 2022-02-17 NOTE — Progress Notes (Signed)
ANTICOAGULATION CONSULT NOTE  Pharmacy Consult for Heparin Drip Indication: chest pain/ACS  Allergies  Allergen Reactions   Iodinated Contrast Media Shortness Of Breath   Penicillins Anaphylaxis and Shortness Of Breath    Respiratory  Tolerated cefuroxime on 01/06/16   Strawberry Extract Anaphylaxis   Cefepime Itching    Empiric antibiotic, developed pruritis.    Erythromycin Itching   Sulfa Antibiotics Itching, Nausea And Vomiting and Nausea Only    Patient Measurements: Height: 5\' 10"  (177.8 cm) Weight: 87 kg (191 lb 12.8 oz) IBW/kg (Calculated) : 73 Heparin Dosing Weight: 87 kg  Vital Signs: Temp: 97.8 F (36.6 C) (08/13 0415) Temp Source: Oral (08/13 0415) BP: 129/71 (08/13 0415) Pulse Rate: 71 (08/13 0415)  Labs: Recent Labs    02/15/22 0736 02/15/22 0739 02/15/22 0925 02/15/22 1243 02/15/22 1243 02/15/22 1821 02/16/22 0049 02/16/22 0503 02/16/22 0926 02/16/22 1735 02/16/22 2330 02/17/22 0547  HGB 13.7  --   --   --   --   --   --  12.2*  --   --   --  12.3*  HCT 42.4  --   --   --   --   --   --  36.7*  --   --   --  36.8*  PLT 184  --   --   --   --   --   --  160  --   --   --  170  APTT  --   --   --  >200*   < > 35   < >  --  60* 75* 77* 77*  LABPROT  --   --   --  16.5*  --   --   --   --   --   --   --   --   INR  --   --   --  1.3*  --   --   --   --   --   --   --   --   HEPARINUNFRC  --   --   --  >1.10*  --   --   --   --  0.64  --   --  0.44  CREATININE 1.35*  --   --   --   --   --   --  1.21  --   --   --   --   TROPONINIHS 65* 68* 102*  --   --  83*  --   --   --   --   --   --    < > = values in this interval not displayed.     Estimated Creatinine Clearance: 64.5 mL/min (by C-G formula based on SCr of 1.21 mg/dL).   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 Troponin is trending up. Pharmacy consulted for Heparin dosing. Patient was taking Apixaban prior to admission. Baseline HL @1243 : >1.10, aPTT>200  (note heparin bolus given 1150)  08/11 1821 aPTT=35  (note Heparin drip was paused at 1458)  will restart at 950 units/hr. 8/12 0049 aPTT 71, therapeutic x 1 8/12 0926 aPTT 60, HL 0.64  subthera, 1300 u bolus and increase drip from 950 to 1100 u/hr 8/12 1735 aPTT 75,  therapeutic x 1 8/12 2330 aPTT 77, therapeutic x 2 8/13 0547 aPTT 77, therapeutic x 3, HL 0.44   Goal of Therapy:  Heparin level 0.3-0.7 units/ml aPTT 66-102 seconds Monitor platelets by anticoagulation protocol: Yes   Plan:  Continue heparin infusion at 1100 u/hr Since patient was taking Apixaban prior to admission and his baseline HL is elevated at >1.10 will need to use aPTT to guide dosing. Recheck aPTT daily w/ AM labs while therapeutic  aPTT & HL starting to correlate Will check HL to to confirm correlation, then follow HL thereafter Daily CBC while on Heparin infusion  9/13, PharmD, [facility] 02/17/2022 6:22 AM

## 2022-02-17 NOTE — Evaluation (Signed)
Physical Therapy Evaluation Patient Details Name: Carl [name] MRN: [id number] DOB: 28-Sep-1960 Today's Date: 02/17/2022  History of Present Illness  Patient is a 61 year old male with chief concerns of shortness of breath, was hypoxic.  Acute on chronic systolic congestive heart failure, ischemic cardiomyopathy status post ICD, decompensated COPD, acute hypoxic respiratory failure. History of CAD and CABG, ICD, daily tobacco cigarette smoking, bipolar disorder.  Clinical Impression  Patient was agreeable to PT. He is able to follow single step commands consistently. The patient is a poor historian, but the chart indicates patient is from a ALF.  No physical assistance required for bed mobility or transfers. Patient ambulated in the room without assistive device with dyspnea with exertion. Sp02 93% on room air after walking 57ft. Endurance impaired for prolonged standing activity. The patient may benefit from a rolling walker with ambulation as he was reaching out for furniture at times for support, however he declined. Recommend PT follow up to maximize independence and decrease caregiver burden. Anticipate patient can return to ALF at discharge with HHPT.      Recommendations for follow up therapy are one component of a multi-disciplinary discharge planning process, led by the attending physician.  Recommendations may be updated based on patient status, additional functional criteria and insurance authorization.  Follow Up Recommendations Home health PT      Assistance Recommended at Discharge PRN  Patient can return home with the following  Assist for transportation;Direct supervision/assist for medications management;Assistance with cooking/housework;A little help with bathing/dressing/bathroom    Equipment Recommendations None recommended by PT  Recommendations for Other Services       Functional Status Assessment Patient has had a recent decline in their functional status and  demonstrates the ability to make significant improvements in function in a reasonable and predictable amount of time.     Precautions / Restrictions Precautions Precautions: Fall Restrictions Weight Bearing Restrictions: No      Mobility  Bed Mobility Overal bed mobility: Modified Independent                  Transfers Overall transfer level: Needs assistance   Transfers: Sit to/from Stand Sit to Stand: Supervision           General transfer comment: supervision for safety    Ambulation/Gait Ambulation/Gait assistance: Supervision, Min guard Gait Distance (Feet): 25 Feet Assistive device: None Gait Pattern/deviations: Step-through pattern, Narrow base of support, Trunk flexed Gait velocity: decreased     General Gait Details: patient was occasionally reaching out for furniture for support with ambulation. when asked if he wanted to use a rolling walker or cane he declined. Sp02 93% on room air, although patient has dyspnea with exertion. cues for safety  Stairs            Wheelchair Mobility    Modified Rankin (Stroke Patients Only)       Balance Overall balance assessment: Needs assistance Sitting-balance support: Feet supported Sitting balance-Leahy Scale: Good Sitting balance - Comments: no loss of balance   Standing balance support: During functional activity, No upper extremity supported Standing balance-Leahy Scale: Fair Standing balance comment: patient is able to stand to urinate without external support. close stand by assistance for safety                             Pertinent Vitals/Pain      Home Living Family/patient expects to be discharged to:: [facility]isted [facility])  Prior Function Prior Level of Function : Patient poor historian/Family not available             Mobility Comments: patient reports he ambulates without assistance, no assistive device ADLs Comments:  patient reports "it depends" when asked if he needs assistance for bathing. presumably patient needs at least set-up assistance for bathing/dressing     Hand Dominance        Extremity/Trunk Assessment   Upper Extremity Assessment Upper Extremity Assessment: Generalized weakness    Lower Extremity Assessment Lower Extremity Assessment: Generalized weakness       Communication      Cognition Arousal/Alertness: Awake/alert Behavior During Therapy: WFL for tasks assessed/performed Overall Cognitive Status: No family/caregiver present to determine baseline cognitive functioning                                 General Comments: patient is able to follow single step commands consistently. he has tangential speech at times and often will answer questions with a response that is not relevant to the topic or give a vague respose such as "it depends". the patient is a poor historian        General Comments      Exercises     Assessment/Plan    PT Assessment Patient needs continued PT services  PT Problem List Decreased strength;Decreased range of motion;Decreased activity tolerance;Decreased balance;Decreased mobility;Cardiopulmonary status limiting activity       PT Treatment Interventions DME instruction;Gait training;Stair training;Functional mobility training;Therapeutic activities;Therapeutic exercise;Balance training;Neuromuscular re-education;Cognitive remediation    PT Goals (Current goals can be found in the Care Plan section)  Acute Rehab PT Goals Patient Stated Goal: none stated PT Goal Formulation: With patient Time For Goal Achievement: 03/03/22 Potential to Achieve Goals: Fair    Frequency Min 2X/week     Co-evaluation               AM-PAC PT "6 Clicks" Mobility  Outcome Measure Help needed turning from your back to your side while in a flat bed without using bedrails?: None Help needed moving from lying on your back to sitting on  the side of a flat bed without using bedrails?: None Help needed moving to and from a bed to a chair (including a wheelchair)?: A Little Help needed standing up from a chair using your arms (e.g., wheelchair or bedside chair)?: A Little Help needed to walk in hospital room?: A Little Help needed climbing 3-5 steps with a railing? : A Little 6 Click Score: 20    End of Session   Activity Tolerance: Patient limited by fatigue;Patient tolerated treatment well Patient left: in bed;with call bell/phone within reach;with bed alarm set   PT Visit Diagnosis: Unsteadiness on feet (R26.81);Muscle weakness (generalized) (M62.81)    Time: 1884-1660 PT Time Calculation (min) (ACUTE ONLY): 11 min   Charges:   PT Evaluation $PT Eval Low Complexity: 1 Low          Donna Bernard, PT, MPT   Ina Homes 02/17/2022, 1:50 PM

## 2022-02-17 NOTE — Progress Notes (Signed)
PROGRESS NOTE    Carl [name]  UMP:[id number] DOB: [birth year] DOA: 02/15/2022 PCP: Almetta Lovely, [facility]    Brief Narrative:  61 year old male with history of CAD status post CABG in 2017, biventricular ICD presents status post upgrade on October 2021, atrial fibrillation on Eliquis, hyperlipidemia, hypothyroid, ckd3a, hypertension, heart failure reduced ejection fraction, persistent daily tobacco cigarette smoking, who presents emergency department for chief concerns of shortness of breath.   Per report he was found to be hypoxic with SpO2 in the 80s via EMS and was placed on CPAP and transferred to the emergency department for further evaluation.  Weaned off CPAP.  Currently stable on nasal cannula.  Seen in consultation by cardiology.  Likely multifactorial presentation consistent with COPD and CHF.  8/13: Volume status improved.  4 L net negative.  Still wheezing   Assessment & Plan:   Principal Problem:   Acute exacerbation of CHF (congestive heart failure) (HCC) Active Problems:   Elevated troponin   Bipolar 1 disorder (HCC)   Coronary artery disease   Essential hypertension   Hypothyroidism   Acute on chronic combined systolic (congestive) and diastolic (congestive) heart failure (HCC)   Seizure (HCC)   HLD (hyperlipidemia)   Ischemic cardiomyopathy   History of alcohol abuse   Peripheral artery disease (HCC)   Urinary retention due to benign prostatic hyperplasia   Chronic anticoagulation   Weakness   Anxiety   Tobacco use   Elevated partial thromboplastin time (PTT)   Abdominal pain   Dyspnea  Acute on chronic systolic congestive heart failure Ischemic cardiomyopathy status post ICD Suspect presentation is consistent with both CHF and COPD.  Has been weaned off of supplemental oxygen.  Cardiology [facility] consulted. EF 25 to 30% on TTE Plan: Continue IV Lasix 40 mg daily Beta-blocker ACE inhibitor Follow-up 2D echocardiogram Continue GDMT with Coreg and  lisinopril  Decompensated COPD And presentation likely multifactorial owing to decompensated COPD and CHF.  COPD in the setting of chronic tobacco abuse. 8/13, still wheezing Plan: Solu-Medrol Bronchodilator regimen: DuoNebs every 6 hours Twice daily Brovana Twice daily Pulmicort Oxygen if necessary We will consider MetaNeb if patient's respiratory status remains poor  Acute hypoxic respiratory failure Initially required NIPPV, now weaned off Is on room air as of 8/12 Decompensated CHF and COPD as above  Elevated troponin CAD status post CABG Not entirely consistent with ACS We will complete 48 hours of IV heparin 8/13 at 11:45 AM Can transition back to p.o. Eliquis Lipitor and Coreg  Paroxysmal atrial fibrillation Rate p.o. Eliquis today Rate controlled, telemetry monitoring  Abdominal pain Located to right upper quadrant.  Unclear etiology.   Right upper quadrant ultrasound with hepatic steatosis otherwise unremarkable Pain appears resolved  Tobacco use Nicotine as needed patch ordered   Anxiety Seroquel 100 milligrams nightly  HLD (hyperlipidemia) Rosuvastatin 40 mg nightly resumed   Hypothyroidism Levothyroxine 25 mcg daily resumed   Essential hypertension Lisinopril 2.5 mg p.o. daily resumed for 02/16/2022 Coreg 3.125 mg p.o. twice daily resumed on day of admission Furosemide 40 mg IV daily ordered for 2 doses starting on 02/16/2022  Bipolar 1 disorder (HCC) Depakote 500 mg p.o. twice daily and Keppra to 50 mg p.o. twice daily resumed   DVT prophylaxis: Eliquis Code Status: Full code Family Communication: None today.  Offered to call but patient declined Disposition Plan: Status is: Inpatient Remains inpatient appropriate because: Decompensated CHF and COPD   Level of care: Progressive  Consultants:  Cardiology-[facility]  Procedures:  None  Antimicrobials:  None   Subjective: Seen and examined.  Shortness of breath improved.  Endorses feeling  cold.  Objective: Vitals:   02/16/22 2200 02/17/22 0046 02/17/22 0415 02/17/22 0803  BP: (!) 141/76 117/72 129/71 110/78  Pulse:  70 71 65  Resp:  16 18 (!) 21  Temp:  98.9 F (37.2 C) 97.8 F (36.6 C) 97.8 F (36.6 C)  TempSrc:   Oral Oral  SpO2:  96% 95% 99%  Weight:      Height:        Intake/Output Summary (Last 24 hours) at 02/17/2022 1122 Last data filed at 02/17/2022 1027 Gross per 24 hour  Intake 397.36 ml  Output 2175 ml  Net -1777.64 ml   Filed Weights   02/15/22 0738  Weight: 87 kg    Examination:  General exam: NAD Respiratory system: Bibasilar crackles.  End expiratory wheeze.  Normal work of breathing.  Room air Cardiovascular system: S2, RRR, no murmurs, no pedal edema Gastrointestinal system: Soft, NT/ND, normal bowel sounds Central nervous system: Alert and oriented. No focal neurological deficits. Extremities: Symmetric 5 x 5 power. Skin: No rashes, lesions or ulcers Psychiatry: Judgement and insight appear normal. Mood & affect appropriate.     Data Reviewed: I have personally reviewed following labs and imaging studies  CBC: Recent Labs  Lab 02/15/22 0736 02/16/22 0503 02/17/22 0547  WBC 10.3 12.0* 14.6*  NEUTROABS 6.5  --   --   HGB 13.7 12.2* 12.3*  HCT 42.4 36.7* 36.8*  MCV 88.9 87.6 87.2  PLT 184 160 170   Basic Metabolic Panel: Recent Labs  Lab 02/15/22 0736 02/16/22 0503 02/17/22 1002  NA 138 136 137  K 4.3 4.2 4.2  CL 106 103 99  CO2 20* 26 27  GLUCOSE 231* 121* 108*  BUN 24* 30* 41*  CREATININE 1.35* 1.21 1.33*  CALCIUM 8.9 9.5 9.8  MG  --   --  2.1   GFR: Estimated Creatinine Clearance: 58.7 mL/min (A) (by C-G formula based on SCr of 1.33 mg/dL (H)). Liver Function Tests: Recent Labs  Lab 02/15/22 0736  AST 49*  ALT 32  ALKPHOS 82  BILITOT 1.0  PROT 7.0  ALBUMIN 3.4*   No results for input(s): "LIPASE", "AMYLASE" in the last 168 hours. Recent Labs  Lab 02/15/22 1455  AMMONIA 21   Coagulation  Profile: Recent Labs  Lab 02/15/22 1243  INR 1.3*   Cardiac Enzymes: No results for input(s): "CKTOTAL", "CKMB", "CKMBINDEX", "TROPONINI" in the last 168 hours. BNP (last 3 results) No results for input(s): "PROBNP" in the last 8760 hours. HbA1C: No results for input(s): "HGBA1C" in the last 72 hours. CBG: Recent Labs  Lab 02/15/22 1152 02/16/22 0822  GLUCAP 167* 188*   Lipid Profile: No results for input(s): "CHOL", "HDL", "LDLCALC", "TRIG", "CHOLHDL", "LDLDIRECT" in the last 72 hours. Thyroid Function Tests: No results for input(s): "TSH", "T4TOTAL", "FREET4", "T3FREE", "THYROIDAB" in the last 72 hours. Anemia Panel: No results for input(s): "VITAMINB12", "FOLATE", "FERRITIN", "TIBC", "IRON", "RETICCTPCT" in the last 72 hours. Sepsis Labs: Recent Labs  Lab 02/15/22 0744 02/15/22 0925 02/15/22 1243  PROCALCITON  --   --  <0.10  LATICACIDVEN 3.6* 3.3*  --     Recent Results (from the past 240 hour(s))  Resp Panel by RT-PCR (Flu A&B, Covid) Anterior Nasal Swab     Status: None   Collection Time: 02/15/22  7:44 AM   Specimen: Anterior Nasal Swab  Result Value Ref Range Status   SARS Coronavirus  2 by RT PCR NEGATIVE NEGATIVE Final    Comment: (NOTE) SARS-CoV-2 target nucleic acids are NOT DETECTED.  The SARS-CoV-2 RNA is generally detectable in upper respiratory specimens during the acute phase of infection. The lowest concentration of SARS-CoV-2 viral copies this assay can detect is 138 copies/mL. A negative result does not preclude SARS-Cov-2 infection and should not be used as the sole basis for treatment or other patient management decisions. A negative result may occur with  improper specimen collection/handling, submission of specimen other than nasopharyngeal swab, presence of viral mutation(s) within the areas targeted by this assay, and inadequate number of viral copies(<138 copies/mL). A negative result must be combined with clinical observations, patient  history, and epidemiological information. The expected result is Negative.  Fact Sheet for Patients:  BloggerCourse.com  Fact Sheet for Healthcare Providers:  SeriousBroker.it  This test is no t yet approved or cleared by the [place] FDA and  has been authorized for detection and/or diagnosis of SARS-CoV-2 by FDA under an Emergency Use Authorization (EUA). This EUA will remain  in effect (meaning this test can be used) for the duration of the COVID-19 declaration under Section 564(b)(1) of the Act, 21 U.S.C.section 360bbb-3(b)(1), unless the authorization is terminated  or revoked sooner.       Influenza A by PCR NEGATIVE NEGATIVE Final   Influenza B by PCR NEGATIVE NEGATIVE Final    Comment: (NOTE) The Xpert Xpress SARS-CoV-2/FLU/RSV plus assay is intended as an aid in the diagnosis of influenza from Nasopharyngeal swab specimens and should not be used as a sole basis for treatment. Nasal washings and aspirates are unacceptable for Xpert Xpress SARS-CoV-2/FLU/RSV testing.  Fact Sheet for Patients: BloggerCourse.com  Fact Sheet for Healthcare Providers: SeriousBroker.it  This test is not yet approved or cleared by the [place] FDA and has been authorized for detection and/or diagnosis of SARS-CoV-2 by FDA under an Emergency Use Authorization (EUA). This EUA will remain in effect (meaning this test can be used) for the duration of the COVID-19 declaration under Section 564(b)(1) of the Act, 21 U.S.C. section 360bbb-3(b)(1), unless the authorization is terminated or revoked.  Performed at [facility], [address] [place], [place] [address]   SARS Coronavirus 2 by RT PCR (hospital order, performed in [facility] hospital lab) *cepheid single result test* Anterior Nasal Swab     Status: None   Collection Time: 02/15/22 12:43 PM   Specimen: Anterior  Nasal Swab  Result Value Ref Range Status   SARS Coronavirus 2 by RT PCR NEGATIVE NEGATIVE Final    Comment: (NOTE) SARS-CoV-2 target nucleic acids are NOT DETECTED.  The SARS-CoV-2 RNA is generally detectable in upper and lower respiratory specimens during the acute phase of infection. The lowest concentration of SARS-CoV-2 viral copies this assay can detect is 250 copies / mL. A negative result does not preclude SARS-CoV-2 infection and should not be used as the sole basis for treatment or other patient management decisions.  A negative result may occur with improper specimen collection / handling, submission of specimen other than nasopharyngeal swab, presence of viral mutation(s) within the areas targeted by this assay, and inadequate number of viral copies (<250 copies / mL). A negative result must be combined with clinical observations, patient history, and epidemiological information.  Fact Sheet for Patients:   RoadLapTop.co.za  Fact Sheet for Healthcare Providers: http://kim-miller.com/  This test is not yet approved or  cleared by the [place] FDA and has been authorized for detection  and/or diagnosis of SARS-CoV-2 by FDA under an Emergency Use Authorization (EUA).  This EUA will remain in effect (meaning this test can be used) for the duration of the COVID-19 declaration under Section 564(b)(1) of the Act, 21 U.S.C. section 360bbb-3(b)(1), unless the authorization is terminated or revoked sooner.  Performed at [facility], [address], [place], [place] [address]          Radiology Studies: ECHOCARDIOGRAM COMPLETE  Result Date: 02/17/2022    ECHOCARDIOGRAM REPORT   Patient Name:   LEELYN JASINSKI Date of Exam: 02/16/2022 Medical Rec #:  [id number]    Height:       70.0 in Accession #:    [id number]   Weight:       191.8 lb Date of Birth:  08/24/1960    BSA:          2.051 m Patient Age:    61 years     BP:            108/67 mmHg Patient Gender: M            HR:           72 bpm. Exam Location:  [facility] Procedure: 2D Echo, Color Doppler, Cardiac Doppler and Intracardiac            Opacification Agent Indications:     R06.00 Dyspnea; Elevated troponin  History:         Patient has prior history of Echocardiogram examinations, most                  recent 08/16/2020. CHF, Previous Myocardial Infarction,                  Defibrillator, CKD, COPD and Stroke; Risk Factors:Current                  Smoker and Hypertension.  Sonographer:     Humphrey Rolls Referring Phys:  [id number] AMY N COX Diagnosing Phys: Chilton Si MD IMPRESSIONS  1. Left ventricular ejection fraction, by estimation, is 25 to 30%. The left ventricle has severely decreased function. The left ventricle demonstrates regional wall motion abnormalities (see scoring diagram/findings for description). The left ventricular internal cavity size was moderately dilated. There is mild concentric left ventricular hypertrophy. Left ventricular diastolic parameters are indeterminate. Elevated left ventricular end-diastolic pressure.  2. Right ventricular systolic function is normal. The right ventricular size is normal.  3. Left atrial size was severely dilated.  4. Right atrial size was mildly dilated.  5. The mitral valve is normal in structure. Mild mitral valve regurgitation. No evidence of mitral stenosis.  6. The aortic valve is tricuspid. Aortic valve regurgitation is mild. No aortic stenosis is present.  7. The inferior vena cava is normal in size with <50% respiratory variability, suggesting right atrial pressure of 8 mmHg. FINDINGS  Left Ventricle: Left ventricular ejection fraction, by estimation, is 25 to 30%. The left ventricle has severely decreased function. The left ventricle demonstrates regional wall motion abnormalities. Definity contrast agent was given IV to delineate the left ventricular endocardial borders. The left ventricular internal cavity size was  moderately dilated. There is mild concentric left ventricular hypertrophy. Left ventricular diastolic function could not be evaluated due to atrial fibrillation. Left  ventricular diastolic parameters are indeterminate. Elevated left ventricular end-diastolic pressure.  LV Wall Scoring: The mid and distal anterior wall, apical lateral segment, mid anterolateral segment, and apex are akinetic. The posterior wall, basal anterolateral segment, basal anterior  segment, and apical inferior segment are hypokinetic. The entire septum and inferior wall are normal. Right Ventricle: The right ventricular size is normal. No increase in right ventricular wall thickness. Right ventricular systolic function is normal. Left Atrium: Left atrial size was severely dilated. Right Atrium: Right atrial size was mildly dilated. Pericardium: There is no evidence of pericardial effusion. Mitral Valve: The mitral valve is normal in structure. Mild mitral annular calcification. Mild mitral valve regurgitation. No evidence of mitral valve stenosis. Tricuspid Valve: The tricuspid valve is normal in structure. Tricuspid valve regurgitation is mild . No evidence of tricuspid stenosis. Aortic Valve: The aortic valve is tricuspid. Aortic valve regurgitation is mild. No aortic stenosis is present. Aortic valve mean gradient measures 3.0 mmHg. Aortic valve peak gradient measures 6.0 mmHg. Aortic valve area, by VTI measures 2.54 cm. Pulmonic Valve: The pulmonic valve was normal in structure. Pulmonic valve regurgitation is not visualized. No evidence of pulmonic stenosis. Aorta: The aortic root is normal in size and structure. Venous: The inferior vena cava is normal in size with less than 50% respiratory variability, suggesting right atrial pressure of 8 mmHg. IAS/Shunts: No atrial level shunt detected by color flow Doppler.  LEFT VENTRICLE PLAX 2D LVIDd:         6.21 cm      Diastology LVIDs:         4.79 cm      LV e' lateral:   5.11 cm/s LV PW:          1.14 cm      LV E/e' lateral: 34.8 LV IVS:        1.03 cm LVOT diam:     2.10 cm LV SV:         62 LV SV Index:   30 LVOT Area:     3.46 cm  LV Volumes (MOD) LV vol d, MOD A2C: 233.0 ml LV vol d, MOD A4C: 233.0 ml LV vol s, MOD A2C: 151.0 ml LV vol s, MOD A4C: 149.0 ml LV SV MOD A2C:     82.0 ml LV SV MOD A4C:     233.0 ml LV SV MOD BP:      79.7 ml RIGHT VENTRICLE RV Basal diam:  4.12 cm LEFT ATRIUM             Index        RIGHT ATRIUM           Index LA diam:        6.50 cm 3.17 cm/m   RA Area:     20.50 cm LA Vol (A2C):   90.2 ml 43.99 ml/m  RA Volume:   59.60 ml  29.07 ml/m LA Vol (A4C):   70.9 ml 34.58 ml/m LA Biplane Vol: 80.5 ml 39.26 ml/m  AORTIC VALVE                    PULMONIC VALVE AV Area (Vmax):    2.49 cm     PV Vmax:       0.91 m/s AV Area (Vmean):   2.59 cm     PV Peak grad:  3.3 mmHg AV Area (VTI):     2.54 cm AV Vmax:           122.00 cm/s AV Vmean:          84.300 cm/s AV VTI:            0.243 m AV Peak Grad:  6.0 mmHg AV Mean Grad:      3.0 mmHg LVOT Vmax:         87.70 cm/s LVOT Vmean:        63.100 cm/s LVOT VTI:          0.178 m LVOT/AV VTI ratio: 0.73  AORTA Ao Root diam: 3.50 cm MITRAL VALVE                TRICUSPID VALVE MV Area (PHT): 3.16 cm     TR Peak grad:   25.4 mmHg MV Decel Time: 240 msec     TR Vmax:        252.00 cm/s MR Peak grad: 70.9 mmHg MR Vmax:      421.00 cm/s   SHUNTS MV E velocity: 178.00 cm/s  Systemic VTI:  0.18 m                             Systemic Diam: 2.10 cm Chilton Si MD Electronically signed by Chilton Si MD Signature Date/Time: 02/17/2022/11:16:32 AM    Final    US Abdomen Limited RUQ (LIVER/GB)  Result Date: 02/16/2022 CLINICAL DATA:  Right upper quadrant pain. EXAM: ULTRASOUND ABDOMEN LIMITED RIGHT UPPER QUADRANT COMPARISON:  None Available. FINDINGS: Gallbladder: The gallbladder is contracted. No stone or sonographic Eulah Pont sign is present. Common bile duct: Diameter: 1.9 mm, within normal limits. Liver: The liver is  diffusely echogenic. No focal lesions are present. Portal vein is patent on color Doppler imaging with normal direction of blood flow towards the liver. Other: None. IMPRESSION: 1. Diffusely echogenic liver suggesting fatty infiltration. 2. Contracted gallbladder. Patient was not fasting. No acute abnormality. Electronically Signed   By: Marin Roberts M.D.   On: 02/16/2022 13:24   CT Angio Chest PE W and/or Wo Contrast  Result Date: 02/15/2022 CLINICAL DATA:  Positive D-dimer. Clinical suspicion for pulmonary embolism. EXAM: CT ANGIOGRAPHY CHEST WITH CONTRAST TECHNIQUE: Multidetector CT imaging of the chest was performed using the standard protocol during bolus administration of intravenous contrast. Multiplanar CT image reconstructions and MIPs were obtained to evaluate the vascular anatomy. RADIATION DOSE REDUCTION: This exam was performed according to the departmental dose-optimization program which includes automated exposure control, adjustment of the mA and/or kV according to patient size and/or use of iterative reconstruction technique. CONTRAST:  46mL OMNIPAQUE IOHEXOL 350 MG/ML SOLN COMPARISON:  Current and prior chest radiographs. FINDINGS: Cardiovascular: Pulmonary arteries are well opacified. There is no evidence of a pulmonary embolism. Heart mildly enlarged. There changes from prior cardiac surgery and mitral valve replacement. No pericardial effusion. Great vessels are normal in caliber. Aorta not opacified. Aortic atherosclerotic calcifications. Mediastinum/Nodes: No neck base, mediastinal or hilar masses or enlarged lymph nodes. Trachea and esophagus are unremarkable. Lungs/Pleura: Mild dependent atelectasis, lower lobes and left upper lobe lingula. Lungs otherwise clear. No pleural effusion. No pneumothorax. Upper Abdomen: No acute findings. Fat containing midline upper abdominal hernia. Aortic atherosclerosis. Musculoskeletal: No acute fracture or acute finding. Prior left clavicle  fracture ORIF. No osteoblastic or osteolytic lesions. No chest mass. Review of the MIP images confirms the above findings. IMPRESSION: 1. No evidence of a pulmonary embolism. 2. No acute findings. 3. Cardiomegaly and changes from prior cardiac surgery. 4. Aortic atherosclerosis. Aortic Atherosclerosis (ICD10-I70.0). Electronically Signed   By: Amie Portland M.D.   On: 02/15/2022 12:27        Scheduled Meds:  apixaban  5 mg Oral BID   arformoterol  15 mcg Nebulization BID   atorvastatin  40 mg Oral QHS   budesonide (PULMICORT) nebulizer solution  0.25 mg Nebulization BID   carvedilol  3.125 mg Oral BID WC   divalproex  500 mg Oral BID   escitalopram  10 mg Oral QHS   hydrOXYzine  10 mg Oral BID   ipratropium-albuterol  3 mL Nebulization Q6H   levETIRAcetam  250 mg Oral BID   levothyroxine  25 mcg Oral Q0600   lisinopril  2.5 mg Oral Daily   methylPREDNISolone (SOLU-MEDROL) injection  40 mg Intravenous Q24H   QUEtiapine  100 mg Oral QHS   Continuous Infusions:  heparin 1,100 Units/hr (02/16/22 1640)     LOS: 2 days      Tresa MooreSudheer B Derrick Tiegs, MD [facility]   If 7PM-7AM, please contact night-coverage  02/17/2022, 11:22 AM

## 2022-02-17 NOTE — Progress Notes (Signed)
ANTICOAGULATION CONSULT NOTE  Pharmacy Consult for transition from UFH to Eliquis Indication: Afib  Allergies  Allergen Reactions   Iodinated Contrast Media Shortness Of Breath   Penicillins Anaphylaxis and Shortness Of Breath    Respiratory  Tolerated cefuroxime on 01/06/16   Strawberry Extract Anaphylaxis   Cefepime Itching    Empiric antibiotic, developed pruritis.    Erythromycin Itching   Sulfa Antibiotics Itching, Nausea And Vomiting and Nausea Only    Patient Measurements: Height: 5\' 10"  (177.8 cm) Weight: 87 kg (191 lb 12.8 oz) IBW/kg (Calculated) : 73  Vital Signs: Temp: 97.8 F (36.6 C) (08/13 0803) Temp Source: Oral (08/13 0803) BP: 110/78 (08/13 0803) Pulse Rate: 65 (08/13 0803)  Labs: Recent Labs    02/15/22 0736 02/15/22 0739 02/15/22 0925 02/15/22 1243 02/15/22 1243 02/15/22 1821 02/16/22 0049 02/16/22 0503 02/16/22 0926 02/16/22 1735 02/16/22 2330 02/17/22 0547  HGB 13.7  --   --   --   --   --   --  12.2*  --   --   --  12.3*  HCT 42.4  --   --   --   --   --   --  36.7*  --   --   --  36.8*  PLT 184  --   --   --   --   --   --  160  --   --   --  170  APTT  --   --   --  >200*   < > 35   < >  --  60* 75* 77* 77*  LABPROT  --   --   --  16.5*  --   --   --   --   --   --   --   --   INR  --   --   --  1.3*  --   --   --   --   --   --   --   --   HEPARINUNFRC  --   --   --  >1.10*  --   --   --   --  0.64  --   --  0.44  CREATININE 1.35*  --   --   --   --   --   --  1.21  --   --   --   --   TROPONINIHS 65* 68* 102*  --   --  83*  --   --   --   --   --   --    < > = values in this interval not displayed.    Estimated Creatinine Clearance: 64.5 mL/min (by C-G formula based on SCr of 1.21 mg/dL).   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 PMH includes CAD s/p CABG 2017, Afib, HLD, CKD Stage 3A, HTN, HFrEF (LVEF 30-35%), daily cigarette smoking. Pt was taking Eliquis PTA for Afib stroke ppx but was transitioned to UFH upon admission due to elevated troponin. Clinical suspicion for ACS was low due to ECG negative for ischemic changes and no chest pain. Presentation likely due to [facility] and COPD. Pt is VSS. Hgb, Hct, and PLT are stable. Pharmacy now consulted for managing transition back to Eliquis from UFH.   Plan:  Discontinue heparin drip Restart Eliquis 5 mg BID at time of drip discontinuation CBC every 3 days while on DOAC  Terie Purser, PharmD PGY-1 Pharmacy Resident 02/17/2022 10:28 AM

## 2022-02-17 NOTE — Progress Notes (Signed)
Progress Note  Patient Name: Carl [name] Date of Encounter: 02/17/2022  [facility] Cardiologist: Lorine BearsMuhammad Arida, MD   Subjective   Breathing is still poor.  Eager to go home.   Inpatient Medications    Scheduled Meds:  apixaban  5 mg Oral BID   arformoterol  15 mcg Nebulization BID   atorvastatin  40 mg Oral QHS   budesonide (PULMICORT) nebulizer solution  0.25 mg Nebulization BID   carvedilol  3.125 mg Oral BID WC   divalproex  500 mg Oral BID   escitalopram  10 mg Oral QHS   hydrOXYzine  10 mg Oral BID   ipratropium-albuterol  3 mL Nebulization Q6H   levETIRAcetam  250 mg Oral BID   levothyroxine  25 mcg Oral Q0600   lisinopril  2.5 mg Oral Daily   methylPREDNISolone (SOLU-MEDROL) injection  40 mg Intravenous Q24H   QUEtiapine  100 mg Oral QHS   Continuous Infusions:  PRN Meds: acetaminophen **OR** acetaminophen, nicotine, ondansetron **OR** ondansetron (ZOFRAN) IV, senna-docusate, traMADol   Vital Signs    Vitals:   02/17/22 0046 02/17/22 0415 02/17/22 0803 02/17/22 1124  BP: 117/72 129/71 110/78 113/66  Pulse: 70 71 65 82  Resp: 16 18 (!) 21 (!) 21  Temp: 98.9 F (37.2 C) 97.8 F (36.6 C) 97.8 F (36.6 C) 98.1 F (36.7 C)  TempSrc:  Oral Oral   SpO2: 96% 95% 99% 95%  Weight:      Height:        Intake/Output Summary (Last 24 hours) at 02/17/2022 1226 Last data filed at 02/17/2022 1151 Gross per 24 hour  Intake 468.06 ml  Output 1825 ml  Net -1356.94 ml      02/15/2022    7:38 AM 09/20/2021   10:58 AM 08/21/2021   10:53 AM  Last 3 Weights  Weight (lbs) 191 lb 12.8 oz 185 lb 4 oz 189 lb  Weight (kg) 87 kg 84.029 kg 85.73 kg      Telemetry    ASVP - Personally Reviewed  ECG    Sinus rhythm.  ASVP.  Rate 94 bpm.  PVC - Personally Reviewed  Physical Exam   VS:  BP 113/66   Pulse 82   Temp 98.1 F (36.7 C)   Resp (!) 21   Ht 5\' 10"  (1.778 m)   Wt 87 kg   SpO2 95%   BMI 27.52 kg/m  , BMI Body mass index is 27.52 kg/m. GENERAL:   Chronically ill-appearing.  No acute distress.  HEENT: Pupils equal round and reactive, fundi not visualized, oral mucosa unremarkable.  Poor dentition NECK:  No jugular venous distention, waveform within normal limits, carotid upstroke brisk and symmetric, no bruits, no thyromegaly LUNGS: Diffuse expiratory wheezing.  HEART:  RRR.  PMI not displaced or sustained,S1 and S2 within normal limits, no S3, no S4, no clicks, no rubs, mo murmurs ABD:  Flat, positive bowel sounds normal in frequency in pitch, no bruits, no rebound, no guarding, no midline pulsatile mass, no hepatomegaly, no splenomegaly EXT:  2 plus pulses throughout, no edema, no cyanosis no clubbing SKIN:  No rashes no nodules NEURO:  Cranial nerves II through XII grossly intact, motor grossly intact throughout PSYCH:  Cognitively intact, oriented to person place and time   Labs    High Sensitivity Troponin:   Recent Labs  Lab 02/15/22 0736 02/15/22 0739 02/15/22 0925 02/15/22 1821  TROPONINIHS 65* 68* 102* 83*     Chemistry Recent Labs  Lab  02/15/22 0736 02/16/22 0503 02/17/22 1002  NA 138 136 137  K 4.3 4.2 4.2  CL 106 103 99  CO2 20* 26 27  GLUCOSE 231* 121* 108*  BUN 24* 30* 41*  CREATININE 1.35* 1.21 1.33*  CALCIUM 8.9 9.5 9.8  MG  --   --  2.1  PROT 7.0  --   --   ALBUMIN 3.4*  --   --   AST 49*  --   --   ALT 32  --   --   ALKPHOS 82  --   --   BILITOT 1.0  --   --   GFRNONAA 59* >60 >60  ANIONGAP 12 7 11     Lipids No results for input(s): "CHOL", "TRIG", "HDL", "LABVLDL", "LDLCALC", "CHOLHDL" in the last 168 hours.  Hematology Recent Labs  Lab 02/15/22 0736 02/16/22 0503 02/17/22 0547  WBC 10.3 12.0* 14.6*  RBC 4.77 4.19* 4.22  HGB 13.7 12.2* 12.3*  HCT 42.4 36.7* 36.8*  MCV 88.9 87.6 87.2  MCH 28.7 29.1 29.1  MCHC 32.3 33.2 33.4  RDW 16.7* 16.3* 16.5*  PLT 184 160 170   Thyroid No results for input(s): "TSH", "FREET4" in the last 168 hours.  BNP Recent Labs  Lab 02/15/22 0739   BNP 888.4*    DDimer  Recent Labs  Lab 02/15/22 0736  DDIMER 0.63*     Radiology    ECHOCARDIOGRAM COMPLETE  Result Date: 02/17/2022    ECHOCARDIOGRAM REPORT   [name] Date of Exam: 02/16/2022 Medical Rec #:  04/18/2022    Height:       70.0 in Accession #:    [id number]   Weight:       191.8 lb Date of Birth:  07/22/1960    BSA:          2.051 m Patient Age:    61 years     BP:           108/67 mmHg Patient Gender: M            HR:           72 bpm. Exam Location:  [facility] Procedure: 2D Echo, Color Doppler, Cardiac Doppler and Intracardiac            Opacification Agent Indications:     R06.00 Dyspnea; Elevated troponin  History:         Patient has prior history of Echocardiogram examinations, most                  recent 08/16/2020. CHF, Previous Myocardial Infarction,                  Defibrillator, CKD, COPD and Stroke; Risk Factors:Current                  Smoker and Hypertension.  Sonographer:     10/14/2020 Referring Phys:  Humphrey Rolls AMY N COX Diagnosing Phys: [id number] MD IMPRESSIONS  1. Left ventricular ejection fraction, by estimation, is 25 to 30%. The left ventricle has severely decreased function. The left ventricle demonstrates regional wall motion abnormalities (see scoring diagram/findings for description). The left ventricular internal cavity size was moderately dilated. There is mild concentric left ventricular hypertrophy. Left ventricular diastolic parameters are indeterminate. Elevated left ventricular end-diastolic pressure.  2. Right ventricular systolic function is normal. The right ventricular size is normal.  3. Left atrial size was severely dilated.  4. Right atrial size was mildly  dilated.  5. The mitral valve is normal in structure. Mild mitral valve regurgitation. No evidence of mitral stenosis.  6. The aortic valve is tricuspid. Aortic valve regurgitation is mild. No aortic stenosis is present.  7. The inferior vena cava is normal in size with <50%  respiratory variability, suggesting right atrial pressure of 8 mmHg. FINDINGS  Left Ventricle: Left ventricular ejection fraction, by estimation, is 25 to 30%. The left ventricle has severely decreased function. The left ventricle demonstrates regional wall motion abnormalities. Definity contrast agent was given IV to delineate the left ventricular endocardial borders. The left ventricular internal cavity size was moderately dilated. There is mild concentric left ventricular hypertrophy. Left ventricular diastolic function could not be evaluated due to atrial fibrillation. Left  ventricular diastolic parameters are indeterminate. Elevated left ventricular end-diastolic pressure.  LV Wall Scoring: The mid and distal anterior wall, apical lateral segment, mid anterolateral segment, and apex are akinetic. The posterior wall, basal anterolateral segment, basal anterior segment, and apical inferior segment are hypokinetic. The entire septum and inferior wall are normal. Right Ventricle: The right ventricular size is normal. No increase in right ventricular wall thickness. Right ventricular systolic function is normal. Left Atrium: Left atrial size was severely dilated. Right Atrium: Right atrial size was mildly dilated. Pericardium: There is no evidence of pericardial effusion. Mitral Valve: The mitral valve is normal in structure. Mild mitral annular calcification. Mild mitral valve regurgitation. No evidence of mitral valve stenosis. Tricuspid Valve: The tricuspid valve is normal in structure. Tricuspid valve regurgitation is mild . No evidence of tricuspid stenosis. Aortic Valve: The aortic valve is tricuspid. Aortic valve regurgitation is mild. No aortic stenosis is present. Aortic valve mean gradient measures 3.0 mmHg. Aortic valve peak gradient measures 6.0 mmHg. Aortic valve area, by VTI measures 2.54 cm. Pulmonic Valve: The pulmonic valve was normal in structure. Pulmonic valve regurgitation is not visualized.  No evidence of pulmonic stenosis. Aorta: The aortic root is normal in size and structure. Venous: The inferior vena cava is normal in size with less than 50% respiratory variability, suggesting right atrial pressure of 8 mmHg. IAS/Shunts: No atrial level shunt detected by color flow Doppler.  LEFT VENTRICLE PLAX 2D LVIDd:         6.21 cm      Diastology LVIDs:         4.79 cm      LV e' lateral:   5.11 cm/s LV PW:         1.14 cm      LV E/e' lateral: 34.8 LV IVS:        1.03 cm LVOT diam:     2.10 cm LV SV:         62 LV SV Index:   30 LVOT Area:     3.46 cm  LV Volumes (MOD) LV vol d, MOD A2C: 233.0 ml LV vol d, MOD A4C: 233.0 ml LV vol s, MOD A2C: 151.0 ml LV vol s, MOD A4C: 149.0 ml LV SV MOD A2C:     82.0 ml LV SV MOD A4C:     233.0 ml LV SV MOD BP:      79.7 ml RIGHT VENTRICLE RV Basal diam:  4.12 cm LEFT ATRIUM             Index        RIGHT ATRIUM           Index LA diam:        6.50 cm  3.17 cm/m   RA Area:     20.50 cm LA Vol (A2C):   90.2 ml 43.99 ml/m  RA Volume:   59.60 ml  29.07 ml/m LA Vol (A4C):   70.9 ml 34.58 ml/m LA Biplane Vol: 80.5 ml 39.26 ml/m  AORTIC VALVE                    PULMONIC VALVE AV Area (Vmax):    2.49 cm     PV Vmax:       0.91 m/s AV Area (Vmean):   2.59 cm     PV Peak grad:  3.3 mmHg AV Area (VTI):     2.54 cm AV Vmax:           122.00 cm/s AV Vmean:          84.300 cm/s AV VTI:            0.243 m AV Peak Grad:      6.0 mmHg AV Mean Grad:      3.0 mmHg LVOT Vmax:         87.70 cm/s LVOT Vmean:        63.100 cm/s LVOT VTI:          0.178 m LVOT/AV VTI ratio: 0.73  AORTA Ao Root diam: 3.50 cm MITRAL VALVE                TRICUSPID VALVE MV Area (PHT): 3.16 cm     TR Peak grad:   25.4 mmHg MV Decel Time: 240 msec     TR Vmax:        252.00 cm/s MR Peak grad: 70.9 mmHg MR Vmax:      421.00 cm/s   SHUNTS MV E velocity: 178.00 cm/s  Systemic VTI:  0.18 m                             Systemic Diam: 2.10 cm Chilton Si MD Electronically signed by Chilton Si MD  Signature Date/Time: 02/17/2022/11:16:32 AM    Final    US Abdomen Limited RUQ (LIVER/GB)  Result Date: 02/16/2022 CLINICAL DATA:  Right upper quadrant pain. EXAM: ULTRASOUND ABDOMEN LIMITED RIGHT UPPER QUADRANT COMPARISON:  None Available. FINDINGS: Gallbladder: The gallbladder is contracted. No stone or sonographic Eulah Pont sign is present. Common bile duct: Diameter: 1.9 mm, within normal limits. Liver: The liver is diffusely echogenic. No focal lesions are present. Portal vein is patent on color Doppler imaging with normal direction of blood flow towards the liver. Other: None. IMPRESSION: 1. Diffusely echogenic liver suggesting fatty infiltration. 2. Contracted gallbladder. Patient was not fasting. No acute abnormality. Electronically Signed   By: Marin Roberts M.D.   On: 02/16/2022 13:24    Cardiac Studies   Echo 02/16/22: IMPRESSIONS    1. Left ventricular ejection fraction, by estimation, is 25 to 30%. The  left ventricle has severely decreased function. The left ventricle  demonstrates regional wall motion abnormalities (see scoring  diagram/findings for description). The left  ventricular internal cavity size was moderately dilated. There is mild  concentric left ventricular hypertrophy. Left ventricular diastolic  parameters are indeterminate. Elevated left ventricular end-diastolic  pressure.   2. Right ventricular systolic function is normal. The right ventricular  size is normal.   3. Left atrial size was severely dilated.   4. Right atrial size was mildly dilated.   5. The mitral valve is normal in structure. Mild mitral  valve  regurgitation. No evidence of mitral stenosis.   6. The aortic valve is tricuspid. Aortic valve regurgitation is mild. No  aortic stenosis is present.   7. The inferior vena cava is normal in size with <50% respiratory  variability, suggesting right atrial pressure of 8 mmHg.   Patient Profile     [name] is a 37M with CAD s/p CABG, HFrEF,  ischemic cardiomyopathy, s/p ICD, COPD, PAF, CVA, bipolar disorder admitted with shortness of breath.  Assessment & Plan    # Hypoxic respiratory failure: # COPD exacerbation:  # Shortness of breath:  # Chronic systolic and diastolic heart failure:  He is now back on room air but still remains short of breath and is still very wheezy on exam.  He appears to be euvolemic and his renal function is worsening.  We will stop IV Lasix and put him back on his home dose.  Continue management of his COPD exacerbation per primary team.  Continue lisinopril and carvedilol.  Resume home dose of oral Lasix tomorrow.  Echo showed that his systolic function is stable and unchanged from prior.  # Hypertension:  Blood pressure stable on carvedilol and lisinopril.  #CAD status post CABG: High-sensitivity troponin was elevated but in a pattern consistent with demand ischemia.  He has no chest pain.  Continue with medical management.  Continue Eliquis, carvedilol, and atorvastatin.  # PAF:  He remains in sinus rhythm.  Continue carvedilol and Eliquis.     For questions or updates, please contact [facility] Please consult [email or web address] for contact info under        Signed, Chilton Si, MD  02/17/2022, 12:26 PM
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
## 2022-02-18 DIAGNOSIS — I5022 Chronic systolic (congestive) heart failure: Secondary | ICD-10-CM

## 2022-02-18 DIAGNOSIS — J441 Chronic obstructive pulmonary disease with (acute) exacerbation: Secondary | ICD-10-CM | POA: Diagnosis not present

## 2022-02-18 DIAGNOSIS — I5023 Acute on chronic systolic (congestive) heart failure: Secondary | ICD-10-CM | POA: Diagnosis not present

## 2022-02-18 DIAGNOSIS — J9601 Acute respiratory failure with hypoxia: Secondary | ICD-10-CM

## 2022-02-18 DIAGNOSIS — I480 Paroxysmal atrial fibrillation: Secondary | ICD-10-CM | POA: Diagnosis not present

## 2022-02-18 LAB — BASIC METABOLIC PANEL
Anion gap: 7 (ref 5–15)
BUN: 44 mg/dL — ABNORMAL HIGH (ref 8–23)
CO2: 27 mmol/L (ref 22–32)
Calcium: 10.1 mg/dL (ref 8.9–10.3)
Chloride: 103 mmol/L (ref 98–111)
Creatinine, Ser: 1.14 mg/dL (ref 0.61–1.24)
GFR, Estimated: >60 mL/min (ref >60)
Glucose, Bld: 139 mg/dL — ABNORMAL HIGH (ref 70–99)
Potassium: 4.5 mmol/L (ref 3.5–5.1)
Sodium: 137 mmol/L (ref 135–145)

## 2022-02-18 LAB — CBC
HCT: 36.5 % — ABNORMAL LOW (ref 39.0–52.0)
Hemoglobin: 12.2 g/dL — ABNORMAL LOW (ref 13.0–17.0)
MCH: 29.1 pg (ref 26.0–34.0)
MCHC: 33.4 g/dL (ref 30.0–36.0)
MCV: 87.1 fL (ref 80.0–100.0)
Platelets: 189 10*3/uL (ref 150–400)
RBC: 4.19 MIL/uL — ABNORMAL LOW (ref 4.22–5.81)
RDW: 16.5 % — ABNORMAL HIGH (ref 11.5–15.5)
WBC: 13.6 10*3/uL — ABNORMAL HIGH (ref 4.0–10.5)
nRBC: 0 % (ref 0.0–0.2)

## 2022-02-18 LAB — MAGNESIUM: Magnesium: 2.3 mg/dL (ref 1.7–2.4)

## 2022-02-18 MED ORDER — IPRATROPIUM-ALBUTEROL 0.5-2.5 (3) MG/3ML IN SOLN
3.0000 mL | Freq: Two times a day (BID) | RESPIRATORY_TRACT | Status: DC
Start: 2022-02-18 — End: 2022-02-20
  Administered 2022-02-18 – 2022-02-20 (×4): 3 mL via RESPIRATORY_TRACT
  Filled 2022-02-18 (×4): qty 3

## 2022-02-18 NOTE — Progress Notes (Signed)
Physical Therapy Treatment Patient Details Name: Carl [name] MRN: [id number] DOB: Nov 22, 1960 Today's Date: 02/18/2022   History of Present Illness Patient is a 61 year old male with chief concerns of shortness of breath, was hypoxic.  Acute on chronic systolic congestive heart failure, ischemic cardiomyopathy status post ICD, decompensated COPD, acute hypoxic respiratory failure. History of CAD and CABG, ICD, daily tobacco cigarette smoking, bipolar disorder.    PT Comments    Patient alert, agreeable to PT with some encouragement, stated he did just walk with staff earlier. Denied pain. He was able to perform bed mobility modI, and sit <> stand with and without RW, supervision. He ambulated ~392ft total with CGA-supervision with and without RW. Part of DGI performed without RW and pt noted for mild gait deviations with head turns but no LOB. Some SOB with exertion but spO2 95% or greater on room air throughout. Returned to room with all needs in reach. The patient would benefit from further skilled PT intervention to continue to progress towards goals. Recommendation remains appropriate.     Recommendations for follow up therapy are one component of a multi-disciplinary discharge planning process, led by the attending physician.  Recommendations may be updated based on patient status, additional functional criteria and insurance authorization.  Follow Up Recommendations  Home health PT     Assistance Recommended at Discharge PRN  Patient can return home with the following Assist for transportation;Direct supervision/assist for medications management;Assistance with cooking/housework;A little help with bathing/dressing/bathroom   Equipment Recommendations  None recommended by PT    Recommendations for Other Services       Precautions / Restrictions Precautions Precautions: Fall Precaution Comments: Mod fall risk Restrictions Weight Bearing Restrictions: No     Mobility  Bed  Mobility Overal bed mobility: Modified Independent                  Transfers Overall transfer level: Needs assistance Equipment used: Rolling walker (2 wheels), None Transfers: Sit to/from Stand Sit to Stand: Supervision                Ambulation/Gait Ambulation/Gait assistance: Supervision, Min guard Gait Distance (Feet): 300 Feet Assistive device: Rolling walker (2 wheels), None   Gait velocity: ambulates quickly, cued for pacing for safety         Stairs             Wheelchair Mobility    Modified Rankin (Stroke Patients Only)       Balance Overall balance assessment: Needs assistance Sitting-balance support: Feet supported Sitting balance-Leahy Scale: Good Sitting balance - Comments: no loss of balance     Standing balance-Leahy Scale: Good                   Standardized Balance Assessment Standardized Balance Assessment : Dynamic Gait Index   Dynamic Gait Index Level Surface: Normal Change in Gait Speed: Normal Gait with Horizontal Head Turns: Mild Impairment Gait with Vertical Head Turns: Mild Impairment Gait and Pivot Turn: Normal Step Over Obstacle:  (not tested) Step Around Obstacles:  (not tested) Steps:  (not tested)      Cognition Arousal/Alertness: Awake/alert Behavior During Therapy: WFL for tasks assessed/performed Overall Cognitive Status: No family/caregiver present to determine baseline cognitive functioning                                 General Comments: Pt able to follow single step directions.  Expressive difficulties, including word-finding difficulties and non-sensical speech. some improved ability to provide PLOf        Exercises      General Comments        Pertinent Vitals/Pain Pain Assessment Pain Assessment: No/denies pain    Home Living                          Prior Function            PT Goals (current goals can now be found in the care plan section)  Progress towards PT goals: Progressing toward goals    Frequency    Min 2X/week      PT Plan Current plan remains appropriate    Co-evaluation              AM-PAC PT "6 Clicks" Mobility   Outcome Measure  Help needed turning from your back to your side while in a flat bed without using bedrails?: None Help needed moving from lying on your back to sitting on the side of a flat bed without using bedrails?: None Help needed moving to and from a bed to a chair (including a wheelchair)?: None Help needed standing up from a chair using your arms (e.g., wheelchair or bedside chair)?: None Help needed to walk in hospital room?: None Help needed climbing 3-5 steps with a railing? : A Little 6 Click Score: 23    End of Session Equipment Utilized During Treatment: Gait belt Activity Tolerance: Patient tolerated treatment well Patient left: in bed;with call bell/phone within reach   PT Visit Diagnosis: Unsteadiness on feet (R26.81);Muscle weakness (generalized) (M62.81)     Time: 9518-8416 PT Time Calculation (min) (ACUTE ONLY): 10 min  Charges:  $Therapeutic Activity: 8-22 mins                     Olga Coaster PT, DPT 1:43 PM,02/18/22

## 2022-02-18 NOTE — Progress Notes (Signed)
Progress Note  Patient Name: Carl [name] Date of Encounter: 02/18/2022  [facility] Cardiologist: Lorine Bears, MD   Subjective   Patient feeling well today.  Still wheezing on exam. Down 379 ml in the last 24 hours.  Inpatient Medications    Scheduled Meds:  apixaban  5 mg Oral BID   arformoterol  15 mcg Nebulization BID   atorvastatin  40 mg Oral QHS   budesonide (PULMICORT) nebulizer solution  0.25 mg Nebulization BID   carvedilol  3.125 mg Oral BID WC   divalproex  500 mg Oral BID   escitalopram  10 mg Oral QHS   furosemide  40 mg Oral Daily   hydrOXYzine  10 mg Oral BID   ipratropium-albuterol  3 mL Nebulization Q6H   levETIRAcetam  250 mg Oral BID   levothyroxine  25 mcg Oral Q0600   lisinopril  2.5 mg Oral Daily   methylPREDNISolone (SOLU-MEDROL) injection  40 mg Intravenous Q24H   QUEtiapine  100 mg Oral QHS   Continuous Infusions:  PRN Meds: acetaminophen **OR** acetaminophen, nicotine, ondansetron **OR** ondansetron (ZOFRAN) IV, senna-docusate, traMADol   Vital Signs    Vitals:   02/17/22 2116 02/17/22 2126 02/18/22 0033 02/18/22 0501  BP: (!) 112/59  (!) 112/55 119/74  Pulse: 70  63 64  Resp: 18  16 18   Temp: 98.4 F (36.9 C)  97.6 F (36.4 C) 97.6 F (36.4 C)  TempSrc: Oral  Oral Oral  SpO2: 94% 94% 94% 97%  Weight:      Height:        Intake/Output Summary (Last 24 hours) at 02/18/2022 0836 Last data filed at 02/17/2022 2000 Gross per 24 hour  Intake 550.7 ml  Output 930 ml  Net -379.3 ml      02/15/2022    7:38 AM 09/20/2021   10:58 AM 08/21/2021   10:53 AM  Last 3 Weights  Weight (lbs) 191 lb 12.8 oz 185 lb 4 oz 189 lb  Weight (kg) 87 kg 84.029 kg 85.73 kg      Telemetry    Not on telemetry - Personally Reviewed  ECG    No new tracings - Personally Reviewed  Physical Exam   GEN: No acute distress.   Neck: No JVD Cardiac: RRR, no murmurs, rubs, or gallops.  Respiratory: wheezes bilaterally. Respirations are unlabored  at rest on room air GI: Soft, nontender, non-distended  MS: No edema; No deformity. Compression hose on to bilateral lower extremities Neuro:  Nonfocal  Psych: Normal affect   Labs    High Sensitivity Troponin:   Recent Labs  Lab 02/15/22 0736 02/15/22 0739 02/15/22 0925 02/15/22 1821  TROPONINIHS 65* 68* 102* 83*     Chemistry Recent Labs  Lab 02/15/22 0736 02/16/22 0503 02/17/22 1002 02/18/22 0525  NA 138 136 137 137  K 4.3 4.2 4.2 4.5  CL 106 103 99 103  CO2 20* 26 27 27   GLUCOSE 231* 121* 108* 139*  BUN 24* 30* 41* 44*  CREATININE 1.35* 1.21 1.33* 1.14  CALCIUM 8.9 9.5 9.8 10.1  MG  --   --  2.1 2.3  PROT 7.0  --   --   --   ALBUMIN 3.4*  --   --   --   AST 49*  --   --   --   ALT 32  --   --   --   ALKPHOS 82  --   --   --   BILITOT 1.0  --   --   --  GFRNONAA 59* >60 >60 >60  ANIONGAP 12 7 11 7     Lipids No results for input(s): "CHOL", "TRIG", "HDL", "LABVLDL", "LDLCALC", "CHOLHDL" in the last 168 hours.  Hematology Recent Labs  Lab 02/16/22 0503 02/17/22 0547 02/18/22 0525  WBC 12.0* 14.6* 13.6*  RBC 4.19* 4.22 4.19*  HGB 12.2* 12.3* 12.2*  HCT 36.7* 36.8* 36.5*  MCV 87.6 87.2 87.1  MCH 29.1 29.1 29.1  MCHC 33.2 33.4 33.4  RDW 16.3* 16.5* 16.5*  PLT 160 170 189   Thyroid No results for input(s): "TSH", "FREET4" in the last 168 hours.  BNP Recent Labs  Lab 02/15/22 0739  BNP 888.4*    DDimer  Recent Labs  Lab 02/15/22 0736  DDIMER 0.63*     Radiology    04/17/22 Abdomen Limited RUQ (LIVER/GB)  Result Date: 02/16/2022 CLINICAL DATA:  Right upper quadrant pain. EXAM: ULTRASOUND ABDOMEN LIMITED RIGHT UPPER QUADRANT COMPARISON:  None Available. FINDINGS: Gallbladder: The gallbladder is contracted. No stone or sonographic 04/18/2022 sign is present. Common bile duct: Diameter: 1.9 mm, within normal limits. Liver: The liver is diffusely echogenic. No focal lesions are present. Portal vein is patent on color Doppler imaging with normal direction of  blood flow towards the liver. Other: None. IMPRESSION: 1. Diffusely echogenic liver suggesting fatty infiltration. 2. Contracted gallbladder. Patient was not fasting. No acute abnormality. Electronically Signed   By: Eulah Pont M.D.   On: 02/16/2022 13:24    Cardiac Studies   Echo 02/16/22: IMPRESSIONS    1. Left ventricular ejection fraction, by estimation, is 25 to 30%. The  left ventricle has severely decreased function. The left ventricle  demonstrates regional wall motion abnormalities (see scoring  diagram/findings for description). The left  ventricular internal cavity size was moderately dilated. There is mild  concentric left ventricular hypertrophy. Left ventricular diastolic  parameters are indeterminate. Elevated left ventricular end-diastolic  pressure.   2. Right ventricular systolic function is normal. The right ventricular  size is normal.   3. Left atrial size was severely dilated.   4. Right atrial size was mildly dilated.   5. The mitral valve is normal in structure. Mild mitral valve  regurgitation. No evidence of mitral stenosis.   6. The aortic valve is tricuspid. Aortic valve regurgitation is mild. No  aortic stenosis is present.   7. The inferior vena cava is normal in size with <50% respiratory  variability, suggesting right atrial pressure of 8 mmHg.   Myoview Lexiscan 12/2019 Narrative & Impression  There was no ST segment deviation noted during stress. No T wave inversion was noted during stress. Defect 1: There is a large defect of severe severity present in the mid anterior, mid inferior, mid inferolateral, apical anterior, apical inferior and apex location. Findings consistent with prior myocardial infarction in the mid to distal LAD and RCA/left circumflex. No significant reversibility. This is a high risk study. Nuclear stress EF: 23%.    Patient Profile     61 y.o. male with CAD s/p CABG, HFrEF, ischemic cardiomyopathy, s/p ICD, COPD,  PAF, CVA, bipolar disorder admitted with shortness of breath.  Assessment & Plan    Acute hypoxic respiratory failure/COPD exacerbation/Chronic systolic and diastolic heart failure/ ICD - s/p BIV ICD upgrade in 10/21 - On room air, saturating at 97% - Continues to have expiratory wheezing but no dyspnea - Euvolemic on exam - Echo showed that his systolic function is stable and unchanged from prior - Creatinine 1.14 today, down from 1.33 yesterday after  stopping IV lasix - Continue lasix 40 mg PO daily - Continued management of his COPD exacerbation per primary team - Continue lisinopril and carvedilol   Hypertension - Blood pressure stable, 119/74 - Continue carvedilol 3.125 mg BID and lisinopril 2.5 mg daily - vital signs per un it protocol   CAD status post CABG: - High-sensitivity troponin was elevated with peak of 102 - Likely demand ischemia, Denies chest pain   - Continue with medical management - Continue Eliquis, carvedilol, and atorvastatin  Paroxysmal Atrial Fibrillation - Recorded normal sinus on telemetry yesterday on rounds, not currently on telemetry - Telemetry monitoring - Continue carvedilol and Eliquis  Hyperlipidemia - LDL 90 on 05/2019, recommend recheck - Continue atorvastatin 40 mg daily  Tobacco Use - Cessation recommended  For questions or updates, please contact [facility] Please consult [email or web address] for contact info under     Signed, Dariane Natzke, NP  02/18/2022, 8:36 AM

## 2022-02-18 NOTE — Evaluation (Signed)
Clinical/Bedside Swallow Evaluation Patient Details  Name: Carl [name] MRN: [id number] Date of Birth: 1960/09/06  Today's Date: 02/18/2022 Time: SLP Start Time (ACUTE ONLY): 1340 SLP Stop Time (ACUTE ONLY): 1440 SLP Time Calculation (min) (ACUTE ONLY): 60 min  Past Medical History:  Past Medical History:  Diagnosis Date   Acute pulmonary edema (HCC) 2017   Acute respiratory failure with hypoxia (HCC) 2017   Bipolar 1 disorder (HCC)    CHF (congestive heart failure) (HCC)    Chronic kidney disease    COPD (chronic obstructive pulmonary disease) (HCC)    Depression    Hypertension    Myocardial infarction [facility])    NSTEMI (non-ST elevated myocardial infarction) (HCC)    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  Acute on chronic systolic congestive heart failure, ischemic cardiomyopathy status post ICD, decompensated COPD, acute hypoxic respiratory failure. PMH includes: CAD and CABG, CVA(left-sided MCA stroke - frontotemporal infarct) per chart, ICD, daily tobacco cigarette smoking, Bipolar Disorder Seizures since childhood.  CT Angio of Chest: No acute findings. Cardiomegaly and changes from prior cardiac surgery. Per Imaging, No recent acute findings on CXRs in recent 2 years.    Assessment / Plan / Recommendation  Clinical Impression  Pt seen for BSE today. Pt was seen in 2021 w/ similar oral phase issues of poor mastication of solids d/t missing/poor condition of his Dentition. Pt is currently on a Regular diet and spoke of  wanting "pizza" for dinner tonight.  He was awake, verbal w/ some hesitation in thought processing. Min+ distracted and impulsive getting up to go to the bathroom w/out warning -- said he did not need to use his walker when offered it. Unsure of pt's baseline Cognitive functioning as no family nor caregiver was present. Overt deficits were noted in his attention, memory, and problem solving/awareness. Noted perseveration on his issues at the Group Home re: "food". Noted min dysfluency during conversation; delay in thought processing.  The above was all noted during the evaluation in 2021. Brief education given on Slowing down when talking to allow time to organize his thoughts and what he wanted to say. Pt agreed.  Pt denied any decline in his swallowing but instead frequently described during this evaluation "difficulty" w/ the foods he was "made to eat" at the Group Home -- stated he kept his own stash of peperoni and cheese in his room. He also indicated difficulty communicating w/ Staff members at his Group Home -- encouraged him to f/u w/ the SW/CM at this admit to discuss this.   Pt appears to present w/ grossly adequate oropharyngeal phase swallow function -- NO pharyngeal phase deficits were noted during po trials. Pt sat on the EOB and followed general aspiration precautions during oral intake at this evaluation(small sips, slowly). No overt neuromuscular deficits noted. Pt consumed po trials w/ No overt, clinical s/s of aspiration during po trials. However, pt does exhibit mild oral phase deficits c/b min increased time for mashing/gumming foods  d/t missing/poor Dentition -- this is Baseline for pt and was noted in evaluation in 2021.  Pt appears at reduced risk for aspiration following general aspiration precautions and eating foods of a more mech soft diet consistency -- moistened, easy to chew.   During po trials, pt consumed all consistencies w/ no overt coughing, decline in vocal quality, or  change in respiratory presentation during/post trials. Oral phase appeared [facility] w/ timely bolus management and control of bolus propulsion for A-P transfer for swallowing. Min increased mastication effort/time noted w/ soft solids(moistened). Oral clearing achieved w/ all trial consistencies w/ time. Pt fed self w/ setup support.  During oral motor exam, pt noted to have quite poor Dentition and a tongue tie, both of which can impact precision of speech articulation. No unilateral lingual/oral weakness. Speech intelligibility was 100%. Missing most Dentition impacts mastication effort and efficiency. This is Baseline for pt; he states he does "fine" and "chews the foods" well -- likes to eat "pizza" and "pepperoni slices".   Recommend a more Mech soft diet consistency w/ gravies added to well-cut meats to moisten. Thin liquids -- monitor straw use and use a CUP if increased coughing noted w/ straw use. Recommend general aspiration precautions, Reduce Distractions at mealtimes. Pills w/ liquids but WHOLE in Puree for safer, easier swallowing IF needed.  Education given on Pills in Puree; food consistencies and easy to eat options d/t poor Dentition status; general aspiration precautions. NSG to reconsult if any new needs arise. NSG agreed. MD updated.  SLP Visit Diagnosis: Dysphagia, oral phase (R13.11) (mastication challenges d/t poor dentition status)    Aspiration Risk   (reduced when following general aspiration precautions)    Diet Recommendation   a more Mech soft diet consistency w/ gravies added to well-cut meats to moisten. Thin liquids -- monitor straw use and use a CUP if increased coughing noted w/ straw use. MUST sit UP for all oral intake. Recommend general aspiration precautions, Reduce Distractions at mealtimes. Tray setup at meals as needed.  Medication Administration: Whole meds with liquid (one at a time; or w/ a puree if any difficulty)    Other  Recommendations Recommended Consults:   (no overt) Oral Care Recommendations: Oral care BID;Oral care before and after PO;Staff/trained caregiver to provide oral care (support) Other Recommendations:  (n/a)    Recommendations for follow up therapy are one component of a multi-disciplinary discharge planning process, led by the attending physician.  Recommendations may be updated based on patient status, additional functional criteria and insurance authorization.  Follow up Recommendations No SLP follow up (pt appears at his baseline)      Assistance Recommended at Discharge PRN (if need indicates at mealtime)  Functional Status Assessment Patient has not had a recent decline in their functional status  Frequency and Duration  (n/a)   (n/a)       Prognosis Prognosis for Safe Diet Advancement: Fair Barriers to Reach Goals: Cognitive deficits;Time post onset;Severity of deficits;Behavior;Language deficits;Motivation Barriers/Prognosis Comment: unsure of pt's Cognitive-linguistic abilities and awareness/motivation w/ situations      Swallow Study   General Date of Onset: 02/15/22 HPI: Patient is a 61 year old male with chief concerns of shortness of breath, was hypoxic.  Acute on chronic systolic congestive heart failure, ischemic cardiomyopathy status post ICD, decompensated COPD, acute hypoxic respiratory failure. PMH includes: CAD and CABG, CVA, ICD, daily tobacco cigarette smoking, Bipolar Disorder Seizures since childhood. Type of Study: Bedside Swallow Evaluation Previous Swallow Assessment: 2021 Diet Prior  to this Study: Regular;Thin liquids Temperature Spikes Noted: No (wbc declinging) Respiratory Status: Room air History of Recent Intubation: No Behavior/Cognition: Alert;Cooperative;Pleasant mood;Distractible;Requires cueing (mild hesitations during conversation - suspect delay in thought processing. this was noted at evaluation during admit in 2021. Usnure of pt's Baseline Cog-linguistic abilities then. Outpt ST  services for f/u rec'd then but unsure if any f/u.) Oral Cavity Assessment:  (not dry) Oral Care Completed by SLP: Recent completion by staff Oral Cavity - Dentition: Poor condition;Missing dentition (pt denied need for oral care but agreed to do so later w/ encouragement - told him that his gums need cleaning too.) Vision: Functional for self-feeding Self-Feeding Abilities: Able to feed self;Needs set up Patient Positioning: Upright in bed (sat EOB) Baseline Vocal Quality: Normal Volitional Cough: Strong Volitional Swallow: Able to elicit    Oral/Motor/Sensory Function Overall Oral Motor/Sensory Function: Within functional limits (no unilateral weakness)   Ice Chips Ice chips: Not tested   Thin Liquid Thin Liquid: Within functional limits Presentation: Self Fed;Straw (~6+ ozs) Other Comments: instructed pt to take breaks, use smaller sips when drinking    Nectar Thick Nectar Thick Liquid: Not tested   Honey Thick Honey Thick Liquid: Not tested   Puree Puree: Within functional limits Presentation: Spoon (5 trials) Other Comments: pudding accepted   Solid     Solid: Impaired (mastication challenges d/t poor dentition status) Presentation: Self Fed (5 trials) Oral Phase Impairments: Impaired mastication (mastication challenges d/t poor dentition status) Oral Phase Functional Implications: Impaired mastication (mastication challenges d/t poor dentition status) Pharyngeal Phase Impairments:  (none) Other Comments: moistened soft solid well         Carl Som, MS, CCC-SLP Speech Language Pathologist Rehab Services; [facility] [place] [phone] (ascom) Carl [name] 02/18/2022,2:50 PM

## 2022-02-18 NOTE — TOC Initial Note (Signed)
Transition of Care [facility]) - Initial/Assessment Note    Patient Details  Name: Carl [name] MRN: [id number] Date of Birth: 1960/10/29  Transition [facility]) CM/SW Contact:    Margarito Liner, LCSW Phone Number: 02/18/2022, 3:53 PM  Clinical Narrative: CSW called Achilles Dunk who owns [facility] and confirmed patient is a resident. He was not receiving home health services prior to admission. [facility] is his legal guardian. Sent secure email to Sharlyne Cai to see who his assigned social worker is.                 Expected Discharge Plan: Assisted Living (with home health if able to secure agency) Barriers to Discharge: Continued Medical Work up   Patient Goals and CMS Choice        Expected Discharge Plan and Services Expected Discharge Plan: Assisted Living (with home health if able to secure agency)     Post Acute Care Choice:  (TBD) Living arrangements for the past 2 months: Assisted Living Facility                                      Prior Living Arrangements/Services Living arrangements for the past 2 months: Assisted Living Facility Lives with:: Facility Resident Patient language and need for interpreter reviewed:: Yes        Need for Family Participation in Patient Care: Yes (Comment) Care giver support system in place?: Yes (comment)   Criminal Activity/Legal Involvement Pertinent to Current Situation/Hospitalization: No - Comment as needed  Activities of Daily Living Home Assistive Devices/Equipment: Dan Humphreys (specify type) ADL Screening (condition at time of admission) Patient's cognitive ability adequate to safely complete daily activities?: Yes Is the patient deaf or have difficulty hearing?: No Does the patient have difficulty seeing, even when wearing glasses/contacts?: No Does the patient have difficulty concentrating, remembering, or making decisions?: No Patient able to express need for assistance with ADLs?: Yes Does the  patient have difficulty dressing or bathing?: No Independently performs ADLs?: Yes (appropriate for developmental age) Does the patient have difficulty walking or climbing stairs?: No Weakness of Legs: None Weakness of Arms/Hands: None  Permission Sought/Granted Permission sought to share information with : Facility Medical sales representative, Guardian       Permission granted to share info w AGENCY: [facility], [place] [facility]        Emotional Assessment       Orientation: : Oriented to Self, Oriented to Place Alcohol / Substance Use: Not Applicable Psych Involvement: No (comment)  Admission diagnosis:  Hypoxia [R09.02] Elevated troponin [R77.8] Acute exacerbation of CHF (congestive heart failure) (HCC) [I50.9] Elevated d-dimer [R79.89] Dyspnea, unspecified type [R06.00] Congestive heart failure, unspecified HF chronicity, unspecified heart failure type [facility]) [I50.9] Patient Active Problem List   Diagnosis Date Noted   Dyspnea    Acute exacerbation of CHF (congestive heart failure) (HCC) 02/15/2022   Anxiety 02/15/2022   Tobacco use 02/15/2022   Elevated partial thromboplastin time (PTT) 02/15/2022   Abdominal pain 02/15/2022   HFrEF (heart failure with reduced ejection fraction) (HCC)    Weakness    COPD with acute exacerbation (HCC) 05/23/2021   PAF (paroxysmal atrial fibrillation) (HCC) 05/23/2021   Chronic anticoagulation 05/23/2021   Abdominal aortic aneurysm (AAA) 35 to 39 mm in diameter (HCC) 05/23/2021   Ischemic cardiomyopathy 05/22/2020   Chronic systolic heart failure (HCC) 05/22/2020   HCAP (healthcare-associated pneumonia)  01/24/2020   Hyperkalemia 01/24/2020   Acute on chronic combined systolic (congestive) and diastolic (congestive) heart failure (HCC) 01/24/2020   Seizure (HCC) 01/24/2020   HLD (hyperlipidemia) 01/24/2020   Ascites due to alcoholic cirrhosis (HCC)    Acute on chronic systolic CHF (congestive heart failure) (HCC) 11/18/2019    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/18/2019   Elevated troponin 11/18/2019   Hypoxia 11/18/2019   Acute renal failure superimposed on stage 3a chronic kidney disease (HCC) 11/18/2019   Coronary artery disease 05/15/2019   Essential hypertension 05/15/2019   History of stroke 05/15/2019   Homicidal ideation 05/15/2019   Hypothyroidism 05/15/2019   Constipation 05/06/2019   Scrotal mass 05/06/2019   Bipolar 1 disorder (HCC) 12/23/2018   Acute on chronic respiratory failure with hypoxia (HCC) 11/03/2018   Chronic pain of left knee 09/17/2018   Atypical chest pain 10/11/2017   Polypharmacy 08/05/2017   Cerebrovascular accident (CVA) due to stenosis of left middle cerebral artery (HCC) 06/26/2017   Urinary retention due to benign prostatic hyperplasia 06/22/2017   Falls 06/20/2017   Risk for falls 02/19/2017   Peripheral artery disease (HCC) 08/28/2016   Claudication (HCC) 08/09/2016   History of alcohol abuse 08/09/2016   History of drug dependence/abuse (HCC) 08/09/2016   Severe single current episode of major depressive disorder, without psychotic features (HCC) 08/09/2016   Mitral insufficiency 01/04/2016   Prediabetes 12/29/2015   Fracture of left clavicle 07/13/2012   PCP:  [facility]:   [facility] - [place], [place] - [address] [place] [address] Phone: [phone] Fax: [phone]     Social Determinants of Health (SDOH) Interventions    Readmission Risk Interventions     No data to display

## 2022-02-18 NOTE — Progress Notes (Signed)
PROGRESS NOTE    Carl [name]  ZOX:[id number] DOB: 06-16-1961 DOA: 02/15/2022 PCP: Almetta [facility]    Brief Narrative:  61 year old male with history of CAD status post CABG in 2017, biventricular ICD presents status post upgrade on October 2021, atrial fibrillation on Eliquis, hyperlipidemia, hypothyroid, ckd3a, hypertension, heart failure reduced ejection fraction, persistent daily tobacco cigarette smoking, who presents emergency department for chief concerns of shortness of breath.   Per report he was found to be hypoxic with SpO2 in the 80s via EMS and was placed on CPAP and transferred to the emergency department for further evaluation.  Weaned off CPAP.  Currently stable on nasal cannula.  Seen in consultation by cardiology.  Likely multifactorial presentation consistent with COPD and CHF.  8/13: Volume status improved.  4 L net negative.  Still wheezing   Assessment & Plan:   Principal Problem:   Acute exacerbation of CHF (congestive heart failure) (HCC) Active Problems:   Elevated troponin   Bipolar 1 disorder (HCC)   Coronary artery disease   Essential hypertension   Hypothyroidism   Acute on chronic combined systolic (congestive) and diastolic (congestive) heart failure (HCC)   Seizure (HCC)   HLD (hyperlipidemia)   Ischemic cardiomyopathy   History of alcohol abuse   Peripheral artery disease (HCC)   Urinary retention due to benign prostatic hyperplasia   Chronic anticoagulation   Weakness   Anxiety   Tobacco use   Elevated partial thromboplastin time (PTT)   Abdominal pain   Dyspnea  Acute on chronic systolic congestive heart failure Ischemic cardiomyopathy status post ICD Suspect presentation is consistent with both CHF and COPD.  Has been weaned off of supplemental oxygen.  Cardiology [facility] consulted. EF 25 to 30% on TTE IV Lasix stopped 8/13 Plan: Continue p.o. Lasix 40 mg daily Continue GDMT with Coreg and lisinopril  Decompensated  COPD presentation likely multifactorial owing to decompensated COPD and CHF.  COPD in the setting of chronic tobacco abuse. 8/13, still wheezing Plan: Continue Solu-Medrol IV 40 mg daily Bronchodilator regimen: DuoNebs every 6 hours Twice daily Brovana Twice daily Pulmicort Oxygen if necessary We will consider MetaNeb if patient's respiratory status remains poor  Acute hypoxic respiratory failure Initially required NIPPV, now weaned off Is on room air as of 8/12 Decompensated CHF and COPD treatment as above  Elevated troponin CAD status post CABG Not entirely consistent with ACS Plan to 48 hours of IV heparin Transitioned back to p.o. Eliquis Lipitor and Coreg  Paroxysmal atrial fibrillation P.o. Eliquis  Abdominal pain Located to right upper quadrant.  Unclear etiology.   Right upper quadrant ultrasound with hepatic steatosis otherwise unremarkable Pain appears resolved  Tobacco use Nicotine as needed patch ordered   Anxiety Seroquel 100 milligrams nightly  HLD (hyperlipidemia) Rosuvastatin 40 mg nightly resumed   Hypothyroidism Levothyroxine 25 mcg daily resumed   Essential hypertension Lisinopril 2.5 mg p.o. daily resumed for 02/16/2022 Coreg 3.125 mg p.o. twice daily resumed on day of admission Furosemide milligrams p.o. daily  Bipolar 1 disorder (HCC) Depakote 500 mg p.o. twice daily and Keppra to 50 mg p.o. twice daily resumed   DVT prophylaxis: Eliquis Code Status: Full code Family Communication: None today.  Offered to call but patient declined Disposition Plan: Status is: Inpatient Remains inpatient appropriate because: Decompensated CHF and COPD.  Still wheezing but improving.  Anticipate 24 to 48 hours prior to discharge.   Level of care: Progressive  Consultants:  Cardiology-[facility]  Procedures:  None  Antimicrobials: None  Subjective: Seen and examined.  Shortness of breath improved  Objective: Vitals:   02/18/22 0033 02/18/22  0501 02/18/22 0850 02/18/22 0851  BP: (!) 112/55 119/74 (!) 100/42   Pulse: 63 64 64   Resp: 16 18 16    Temp: 97.6 F (36.4 C) 97.6 F (36.4 C) 97.7 F (36.5 C)   TempSrc: Oral Oral    SpO2: 94% 97% 99% 96%  Weight:      Height:        Intake/Output Summary (Last 24 hours) at 02/18/2022 1109 Last data filed at 02/17/2022 2000 Gross per 24 hour  Intake 550.7 ml  Output 530 ml  Net 20.7 ml   Filed Weights   02/15/22 0738  Weight: 87 kg    Examination:  General exam: No acute distress Respiratory system: Diffuse respiratory wheeze.  Normal work of breathing.  Room air Cardiovascular system: S2, RRR, no murmurs, no pedal edema Gastrointestinal system: Soft, NT/ND, normal bowel sounds Central nervous system: Alert and oriented. No focal neurological deficits. Extremities: Symmetric 5 x 5 power. Skin: No rashes, lesions or ulcers Psychiatry: Judgement and insight appear normal. Mood & affect appropriate.     Data Reviewed: I have personally reviewed following labs and imaging studies  CBC: Recent Labs  Lab 02/15/22 0736 02/16/22 0503 02/17/22 0547 02/18/22 0525  WBC 10.3 12.0* 14.6* 13.6*  NEUTROABS 6.5  --   --   --   HGB 13.7 12.2* 12.3* 12.2*  HCT 42.4 36.7* 36.8* 36.5*  MCV 88.9 87.6 87.2 87.1  PLT 184 160 170 189   Basic Metabolic Panel: Recent Labs  Lab 02/15/22 0736 02/16/22 0503 02/17/22 1002 02/18/22 0525  NA 138 136 137 137  K 4.3 4.2 4.2 4.5  CL 106 103 99 103  CO2 20* 26 27 27   GLUCOSE 231* 121* 108* 139*  BUN 24* 30* 41* 44*  CREATININE 1.35* 1.21 1.33* 1.14  CALCIUM 8.9 9.5 9.8 10.1  MG  --   --  2.1 2.3   GFR: Estimated Creatinine Clearance: 68.5 mL/min (by C-G formula based on SCr of 1.14 mg/dL). Liver Function Tests: Recent Labs  Lab 02/15/22 0736  AST 49*  ALT 32  ALKPHOS 82  BILITOT 1.0  PROT 7.0  ALBUMIN 3.4*   No results for input(s): "LIPASE", "AMYLASE" in the last 168 hours. Recent Labs  Lab 02/15/22 1455  AMMONIA  21   Coagulation Profile: Recent Labs  Lab 02/15/22 1243  INR 1.3*   Cardiac Enzymes: No results for input(s): "CKTOTAL", "CKMB", "CKMBINDEX", "TROPONINI" in the last 168 hours. BNP (last 3 results) No results for input(s): "PROBNP" in the last 8760 hours. HbA1C: No results for input(s): "HGBA1C" in the last 72 hours. CBG: Recent Labs  Lab 02/15/22 1152 02/16/22 0822  GLUCAP 167* 188*   Lipid Profile: No results for input(s): "CHOL", "HDL", "LDLCALC", "TRIG", "CHOLHDL", "LDLDIRECT" in the last 72 hours. Thyroid Function Tests: No results for input(s): "TSH", "T4TOTAL", "FREET4", "T3FREE", "THYROIDAB" in the last 72 hours. Anemia Panel: No results for input(s): "VITAMINB12", "FOLATE", "FERRITIN", "TIBC", "IRON", "RETICCTPCT" in the last 72 hours. Sepsis Labs: Recent Labs  Lab 02/15/22 0744 02/15/22 0925 02/15/22 1243  PROCALCITON  --   --  <0.10  LATICACIDVEN 3.6* 3.3*  --     Recent Results (from the past 240 hour(s))  Resp Panel by RT-PCR (Flu A&B, Covid) Anterior Nasal Swab     Status: None   Collection Time: 02/15/22  7:44 AM   Specimen: Anterior Nasal  Swab  Result Value Ref Range Status   SARS Coronavirus 2 by RT PCR NEGATIVE NEGATIVE Final    Comment: (NOTE) SARS-CoV-2 target nucleic acids are NOT DETECTED.  The SARS-CoV-2 RNA is generally detectable in upper respiratory specimens during the acute phase of infection. The lowest concentration of SARS-CoV-2 viral copies this assay can detect is 138 copies/mL. A negative result does not preclude SARS-Cov-2 infection and should not be used as the sole basis for treatment or other patient management decisions. A negative result may occur with  improper specimen collection/handling, submission of specimen other than nasopharyngeal swab, presence of viral mutation(s) within the areas targeted by this assay, and inadequate number of viral copies(<138 copies/mL). A negative result must be combined with clinical  observations, patient history, and epidemiological information. The expected result is Negative.  Fact Sheet for Patients:  BloggerCourse.com  Fact Sheet for Healthcare Providers:  SeriousBroker.it  This test is no t yet approved or cleared by the [place] FDA and  has been authorized for detection and/or diagnosis of SARS-CoV-2 by FDA under an Emergency Use Authorization (EUA). This EUA will remain  in effect (meaning this test can be used) for the duration of the COVID-19 declaration under Section 564(b)(1) of the Act, 21 U.S.C.section 360bbb-3(b)(1), unless the authorization is terminated  or revoked sooner.       Influenza A by PCR NEGATIVE NEGATIVE Final   Influenza B by PCR NEGATIVE NEGATIVE Final    Comment: (NOTE) The Xpert Xpress SARS-CoV-2/FLU/RSV plus assay is intended as an aid in the diagnosis of influenza from Nasopharyngeal swab specimens and should not be used as a sole basis for treatment. Nasal washings and aspirates are unacceptable for Xpert Xpress SARS-CoV-2/FLU/RSV testing.  Fact Sheet for Patients: BloggerCourse.com  Fact Sheet for Healthcare Providers: SeriousBroker.it  This test is not yet approved or cleared by the [place] FDA and has been authorized for detection and/or diagnosis of SARS-CoV-2 by FDA under an Emergency Use Authorization (EUA). This EUA will remain in effect (meaning this test can be used) for the duration of the COVID-19 declaration under Section 564(b)(1) of the Act, 21 U.S.C. section 360bbb-3(b)(1), unless the authorization is terminated or revoked.  Performed at [facility], [address] [place], [place] [address]   SARS Coronavirus 2 by RT PCR (hospital order, performed in [facility] hospital lab) *cepheid single result test* Anterior Nasal Swab     Status: None   Collection Time: 02/15/22 12:43 PM    Specimen: Anterior Nasal Swab  Result Value Ref Range Status   SARS Coronavirus 2 by RT PCR NEGATIVE NEGATIVE Final    Comment: (NOTE) SARS-CoV-2 target nucleic acids are NOT DETECTED.  The SARS-CoV-2 RNA is generally detectable in upper and lower respiratory specimens during the acute phase of infection. The lowest concentration of SARS-CoV-2 viral copies this assay can detect is 250 copies / mL. A negative result does not preclude SARS-CoV-2 infection and should not be used as the sole basis for treatment or other patient management decisions.  A negative result may occur with improper specimen collection / handling, submission of specimen other than nasopharyngeal swab, presence of viral mutation(s) within the areas targeted by this assay, and inadequate number of viral copies (<250 copies / mL). A negative result must be combined with clinical observations, patient history, and epidemiological information.  Fact Sheet for Patients:   RoadLapTop.co.za  Fact Sheet for Healthcare Providers: http://kim-miller.com/  This test is not yet approved or  cleared  by the [place] and has been authorized for detection and/or diagnosis of SARS-CoV-2 by FDA under an Emergency Use Authorization (EUA).  This EUA will remain in effect (meaning this test can be used) for the duration of the COVID-19 declaration under Section 564(b)(1) of the Act, 21 U.S.C. section 360bbb-3(b)(1), unless the authorization is terminated or revoked sooner.  Performed at [facility], [address], [place], [place] [address]          Radiology Studies: US Abdomen Limited RUQ (LIVER/GB)  Result Date: 02/16/2022 CLINICAL DATA:  Right upper quadrant pain. EXAM: ULTRASOUND ABDOMEN LIMITED RIGHT UPPER QUADRANT COMPARISON:  None Available. FINDINGS: Gallbladder: The gallbladder is contracted. No stone or sonographic Eulah Pont sign is present. Common  bile duct: Diameter: 1.9 mm, within normal limits. Liver: The liver is diffusely echogenic. No focal lesions are present. Portal vein is patent on color Doppler imaging with normal direction of blood flow towards the liver. Other: None. IMPRESSION: 1. Diffusely echogenic liver suggesting fatty infiltration. 2. Contracted gallbladder. Patient was not fasting. No acute abnormality. Electronically Signed   By: Marin Roberts M.D.   On: 02/16/2022 13:24        Scheduled Meds:  apixaban  5 mg Oral BID   arformoterol  15 mcg Nebulization BID   atorvastatin  40 mg Oral QHS   budesonide (PULMICORT) nebulizer solution  0.25 mg Nebulization BID   carvedilol  3.125 mg Oral BID WC   divalproex  500 mg Oral BID   escitalopram  10 mg Oral QHS   furosemide  40 mg Oral Daily   hydrOXYzine  10 mg Oral BID   ipratropium-albuterol  3 mL Nebulization Q6H   levETIRAcetam  250 mg Oral BID   levothyroxine  25 mcg Oral Q0600   lisinopril  2.5 mg Oral Daily   methylPREDNISolone (SOLU-MEDROL) injection  40 mg Intravenous Q24H   QUEtiapine  100 mg Oral QHS   Continuous Infusions:     LOS: 3 days      Tresa Moore, MD [facility]   If 7PM-7AM, please contact night-coverage  02/18/2022, 11:09 AM

## 2022-02-18 NOTE — Progress Notes (Signed)
Mobility Specialist - Progress Note    02/18/22 0913  Mobility  Activity Ambulated independently in hallway;Stood at bedside;Dangled on edge of bed  Level of Assistance Independent  Assistive Device None  Distance Ambulated (ft) 160 ft  Activity Response Tolerated well  $Mobility charge 1 Mobility   Pt EOB on RA upon arrival. Pt sts and ambulates 1 lap around NS indep. Pt returns to EOB with needs in reach.   Terrilyn Saver  Mobility Specialist  02/18/22 9:14 AM

## 2022-02-18 NOTE — Progress Notes (Signed)
Mobility Specialist - Progress Note    02/18/22 1300  Mobility  Activity Ambulated independently in hallway;Stood at bedside;Dangled on edge of bed  Level of Assistance Independent  Assistive Device None  Distance Ambulated (ft) 160 ft  Activity Response Tolerated well  $Mobility charge 1 Mobility   Pt EOB on RA upon arrival. Pt sts and ambulates 1 lap around NS indep. Pt returns to EOB with needs in reach.   Terrilyn Saver  Mobility Specialist  02/18/22 1:19 PM

## 2022-02-18 NOTE — Consult Note (Signed)
   Heart Failure Nurse Navigator Note  HFrEF 5 to 30%.  Internal ventricular cavity moderately dilated.  Mild concentric LVH.  Determinate diastolic dysfunction.  Mild mitral regurgitation.  He presented to the emergency room with complaints of shortness of breath by way of EMS from [facility].  Comorbidities:  Coronary artery disease/CABG-MVR Atrial fibrillation on Eliquis Hyperlipidemia  Hypothyroidism kidney disease stage III Hypertension Continued tobacco abuse  BiV ICD  Medications:  Apixaban 5 mg 2 times a day Atorvastatin 40 mg at bedtime Carvedilol 3.125 mg 2 times a day with meals Furosemide 40 mg daily Levothyroxine 25 mcg daily Lisinopril 2.5 mg daily NicoDerm patch daily  Labs:  Sodium 137, potassium 4.5, chloride 103, CO2 27, BUN 44, creatinine 1.14 estimated GFR greater than 60.  Magnesium 2.3, hemoglobin 12.2 and hematocrit 36.5. Weight was not performed today previously had been 87 kg Blood pressure 121/69 Intake 550 mL Output 930 mL  Initial meeting with patient, he was lying quietly in bed in no acute distress.  Is oriented and answers questions appropriately.  He states that he lives in a nursing facility and they do not weigh him daily.  I questioned if patient felt that he could weigh himself daily.  He states that he is active, will go outside and go for walks.  Discussed doing the daily weights and recording reporting to his nurse if he notices a weight gain of 3 pounds overnight or 5 pounds within the week.  Discussed his fluid intake, he states with each meal that he will take into containers of orange juice and 2 cartons of chocolate milk.  This the importance of keeping in less fluids throughout the day, to first try with decreasing his intake with each meal with 1 carton of chocolate milk 1 carton of orange juice.  States that he does not add salt to his food but states that some of the meals he feels are salty and taste.  He  was given the living with heart failure teaching booklet, zone magnet, info on low-sodium and heart failure along with weight chart.  Patient states that he did not bring his glasses with him from the nursing home so he is unable to read the materials, him that I will visit each day and we will continue to discuss all of that and follow-up.  He had no further questions.  Tresa Endo RN [facility]

## 2022-02-19 ENCOUNTER — Telehealth (HOSPITAL_COMMUNITY): Payer: Self-pay | Admitting: Pharmacy Technician

## 2022-02-19 ENCOUNTER — Other Ambulatory Visit (HOSPITAL_COMMUNITY): Payer: Self-pay

## 2022-02-19 DIAGNOSIS — J441 Chronic obstructive pulmonary disease with (acute) exacerbation: Secondary | ICD-10-CM | POA: Diagnosis not present

## 2022-02-19 DIAGNOSIS — I5023 Acute on chronic systolic (congestive) heart failure: Secondary | ICD-10-CM | POA: Diagnosis not present

## 2022-02-19 LAB — BASIC METABOLIC PANEL
Anion gap: 7 (ref 5–15)
BUN: 45 mg/dL — ABNORMAL HIGH (ref 8–23)
CO2: 25 mmol/L (ref 22–32)
Calcium: 10 mg/dL (ref 8.9–10.3)
Chloride: 102 mmol/L (ref 98–111)
Creatinine, Ser: 1.08 mg/dL (ref 0.61–1.24)
GFR, Estimated: >60 mL/min (ref >60)
Glucose, Bld: 144 mg/dL — ABNORMAL HIGH (ref 70–99)
Potassium: 5.2 mmol/L — ABNORMAL HIGH (ref 3.5–5.1)
Sodium: 134 mmol/L — ABNORMAL LOW (ref 135–145)

## 2022-02-19 LAB — MAGNESIUM: Magnesium: 2.1 mg/dL (ref 1.7–2.4)

## 2022-02-19 MED ORDER — PREDNISONE 20 MG PO TABS
20.0000 mg | ORAL_TABLET | Freq: Every day | ORAL | Status: DC
  Administered 2022-02-20: 20 mg via ORAL
  Filled 2022-02-19: qty 1

## 2022-02-19 MED ORDER — ROSUVASTATIN CALCIUM 10 MG PO TABS
40.0000 mg | ORAL_TABLET | Freq: Every day | ORAL | Status: DC
  Administered 2022-02-19: 40 mg via ORAL
  Filled 2022-02-19: qty 4

## 2022-02-19 MED ORDER — PREDNISONE 20 MG PO TABS
40.0000 mg | ORAL_TABLET | Freq: Every day | ORAL | Status: DC
Start: 2022-02-20 — End: 2022-02-19

## 2022-02-19 NOTE — Progress Notes (Signed)
Mobility Specialist - Progress Note    02/19/22 0912  Mobility  Activity Ambulated independently in hallway  Level of Assistance Standby assist, set-up cues, supervision of patient - no hands on  Assistive Device None  Distance Ambulated (ft) 320 ft  Activity Response Tolerated well  $Mobility charge 1 Mobility   Pt OOB on RA upon arrival. Pt ambulates to restroom and 2 laps around NS SBA. Pt returns to bed with needs in reach.   Terrilyn Saver  Mobility Specialist  02/19/22 9:13 AM

## 2022-02-19 NOTE — TOC Benefit Eligibility Note (Signed)
Patient Product/process development scientist completed.    The patient is currently admitted and upon discharge could be taking Entresto 24-26 mg.  Requires Prior Authorization  The patient is currently admitted and upon discharge could be taking Farxiga 10 mg.  Requires Prior Authorization  The patient is currently admitted and upon discharge could be taking Jardiance 10 mg.  Requires Prior Authorization  The patient is insured through [facility] Medicaid    Roland Earl, CPhT Pharmacy Patient Advocate Specialist [facility] Patient Advocate Team Direct Number: [phone]  Fax: [phone]

## 2022-02-19 NOTE — Progress Notes (Signed)
Occupational Therapy Treatment Patient Details Name: Carl [name] MRN: [id number] DOB: 10/25/1960 Today's Date: 02/19/2022   History of present illness Patient is a 61 year old male with chief concerns of shortness of breath, was hypoxic.  Acute on chronic systolic congestive heart failure, ischemic cardiomyopathy status post ICD, decompensated COPD, acute hypoxic respiratory failure. History of CAD and CABG, ICD, daily tobacco cigarette smoking, bipolar disorder.   OT comments  Upon entering session, pt resting in bed. Pt able to complete bed mobility with Mod I, functional transfers with supervision, ADL tasks with set up A, and functional mobility to the bathroom with Min guard and no AD. Pt followed one step and multi step directions from therapist and demonstrated good safety awareness with OOB mobility after initial VC to take it slow during functional mobility. Pt able to make needs known to therapist and requesting to lay back down to rest after toileting. Pt tolerated treatment session well and is making good progress toward goals. D/C recommendation remains appropriate. OT to continue to follow pt acutely.   Recommendations for follow up therapy are one component of a multi-disciplinary discharge planning process, led by the attending physician.  Recommendations may be updated based on patient status, additional functional criteria and insurance authorization.    Follow Up Recommendations  Home health OT    Assistance Recommended at Discharge Intermittent Supervision/Assistance  Patient can return home with the following  A little help with bathing/dressing/bathroom;Assistance with cooking/housework;Direct supervision/assist for medications management   Equipment Recommendations  Other (comment) (possible RW or SPC)    Recommendations for Other Services      Precautions / Restrictions Precautions Precautions: Fall Restrictions Weight Bearing Restrictions: No       Mobility  Bed Mobility Overal bed mobility: Modified Independent             General bed mobility comments: supine <> sit, HOB flat    Transfers Overall transfer level: Needs assistance Equipment used: None Transfers: Sit to/from Stand Sit to Stand: Supervision           General transfer comment: supervision for safety, no AD     Balance Overall balance assessment: Needs assistance Sitting-balance support: Feet supported Sitting balance-Leahy Scale: Good     Standing balance support: During functional activity, No upper extremity supported Standing balance-Leahy Scale: Good Standing balance comment: patient is able to stand to urinate without external support. close stand-by assistance for safety                           ADL either performed or assessed with clinical judgement   ADL Overall ADL's : At baseline;Needs assistance/impaired     Grooming: Wash/dry face;Wash/dry hands;Oral care;Standing;Supervision/safety;Set up Grooming Details (indicate cue type and reason): Pt completed grooming tasks while standing at sink with set up A and supervision for safety             Lower Body Dressing: Supervision/safety;Sitting/lateral leans;Sit to/from stand Lower Body Dressing Details (indicate cue type and reason): Pt able to thread BLEs through brief while sitting EOB with supervision, able to hike briefs over B hips in standing with Min guard     Toileting- Clothing Manipulation and Hygiene: Supervision/safety Toileting - Clothing Manipulation Details (indicate cue type and reason): Pt urinated in standing with close supervision     Functional mobility during ADLs: Min guard General ADL Comments: Pt completed functional mobility to the bathroom with Min guard and no AD (pt deferring  to use RW stating "I don't need it")    Extremity/Trunk Assessment Upper Extremity Assessment Upper Extremity Assessment: Overall WFL for tasks assessed   Lower Extremity  Assessment Lower Extremity Assessment: Overall WFL for tasks assessed   Cervical / Trunk Assessment Cervical / Trunk Assessment: Normal    Vision       Perception     Praxis      Cognition Arousal/Alertness: Awake/alert Behavior During Therapy: WFL for tasks assessed/performed Overall Cognitive Status: No family/caregiver present to determine baseline cognitive functioning                                 General Comments: Pt able to follow one step commands consistently. No instances of nonsensical statements noted. Able to engage in conversation with therapist and make needs known (i.e. toileting).        Exercises      Shoulder Instructions       General Comments      Pertinent Vitals/ Pain       Pain Assessment Pain Assessment: No/denies pain  Home Living                                          Prior Functioning/Environment              Frequency  Min 2X/week        Progress Toward Goals  OT Goals(current goals can now be found in the care plan section)  Progress towards OT goals: Progressing toward goals  Acute Rehab OT Goals Patient Stated Goal: return home OT Goal Formulation: With patient Time For Goal Achievement: 03/03/22 Potential to Achieve Goals: Good  Plan Discharge plan remains appropriate;Frequency remains appropriate    Co-evaluation                 AM-PAC OT "6 Clicks" Daily Activity     Outcome Measure   Help from another person eating meals?: None Help from another person taking care of personal grooming?: A Little Help from another person toileting, which includes using toliet, bedpan, or urinal?: A Little Help from another person bathing (including washing, rinsing, drying)?: A Little Help from another person to put on and taking off regular upper body clothing?: None Help from another person to put on and taking off regular lower body clothing?: A Little 6 Click Score: 20     End of Session Equipment Utilized During Treatment: Gait belt  OT Visit Diagnosis: Unsteadiness on feet (R26.81);Muscle weakness (generalized) (M62.81);Cognitive communication deficit (R41.841)   Activity Tolerance Patient tolerated treatment well   Patient Left in bed;with call bell/phone within reach   Nurse Communication Mobility status        Time: 3382-5053 OT Time Calculation (min): 10 min  Charges: OT General Charges $OT Visit: 1 Visit OT Treatments $Self Care/Home Management : 8-22 mins  [facility] MS, OTR/L ascom [phone]  02/19/22, 3:00 PM

## 2022-02-19 NOTE — Progress Notes (Signed)
PROGRESS NOTE    Carl [name]  IOX:[id number] DOB: [birth year] DOA: 02/15/2022 PCP: Almetta Lovely, [facility]    Brief Narrative:  61 year old male with history of CAD status post CABG in 2017, biventricular ICD presents status post upgrade on October 2021, atrial fibrillation on Eliquis, hyperlipidemia, hypothyroid, ckd3a, hypertension, heart failure reduced ejection fraction, persistent daily tobacco cigarette smoking, who presents emergency department for chief concerns of shortness of breath.   Per report he was found to be hypoxic with SpO2 in the 80s via EMS and was placed on CPAP and transferred to the emergency department for further evaluation.  Weaned off CPAP.  Currently stable on nasal cannula.  Seen in consultation by cardiology.  Likely multifactorial presentation consistent with COPD and CHF.  8/13: Volume status improved.  4 L net negative.  Still wheezing 8/15: Volume status optimized.  Cardiology signed off.  Respiratory status improving but patient still with bilateral wheeze.   Assessment & Plan:   Principal Problem:   Acute exacerbation of CHF (congestive heart failure) (HCC) Active Problems:   Elevated troponin   Bipolar 1 disorder (HCC)   Coronary artery disease   Essential hypertension   Hypothyroidism   Acute on chronic combined systolic (congestive) and diastolic (congestive) heart failure (HCC)   Seizure (HCC)   HLD (hyperlipidemia)   Ischemic cardiomyopathy   History of alcohol abuse   Peripheral artery disease (HCC)   Urinary retention due to benign prostatic hyperplasia   Chronic anticoagulation   Weakness   Anxiety   Tobacco use   Elevated partial thromboplastin time (PTT)   Abdominal pain   Dyspnea  Acute on chronic systolic congestive heart failure Ischemic cardiomyopathy status post ICD Suspect presentation is consistent with both CHF and COPD.  Has been weaned off of supplemental oxygen.  Cardiology [facility] consulted. EF 25 to 30% on  TTE IV Lasix stopped 8/13 Plan: Continue p.o. Lasix 40 mg daily Continue GDMT with Coreg and lisinopril [facility] cardiology signed off  Decompensated COPD presentation likely multifactorial owing to decompensated COPD and CHF.  COPD in the setting of chronic tobacco abuse. 8/13, still wheezing Plan: Continue Solu-Medrol 40 mg IV daily for today P.o. prednisone 20 mg daily from tomorrow, plan for 5-day course Bronchodilator regimen: DuoNebs every 6 hours Twice daily Brovana Twice daily Pulmicort Oxygen if necessary Possible discharge 8/16  Acute hypoxic respiratory failure Initially required NIPPV, now weaned off Is on room air as of 8/12 Decompensated CHF and COPD treatment as above  Elevated troponin CAD status post CABG Not entirely consistent with ACS Plan to 48 hours of IV heparin Transitioned back to p.o. Eliquis Lipitor and Coreg  Paroxysmal atrial fibrillation P.o. Eliquis  Abdominal pain Located to right upper quadrant.  Unclear etiology.   Right upper quadrant ultrasound with hepatic steatosis otherwise unremarkable Pain appears resolved  Tobacco use Nicotine as needed patch ordered   Anxiety Seroquel 100 milligrams nightly  HLD (hyperlipidemia) Rosuvastatin 40 mg nightly resumed   Hypothyroidism Levothyroxine 25 mcg daily resumed   Essential hypertension Lisinopril 2.5 mg p.o. daily resumed for 02/16/2022 Coreg 3.125 mg p.o. twice daily resumed on day of admission Furosemide milligrams p.o. daily  Bipolar 1 disorder (HCC) Depakote 500 mg p.o. twice daily and Keppra to 50 mg p.o. twice daily resumed   DVT prophylaxis: Eliquis Code Status: Full code Family Communication: None today.  Offered to call but patient declined Disposition Plan: Status is: Inpatient Remains inpatient appropriate because: Decompensated CHF and COPD.  Still wheezing but improving.  Anticipate medical readiness for discharge on 8/16   Level of care:  Progressive  Consultants:  Cardiology-[facility]  Procedures:  None  Antimicrobials: None   Subjective: Seen and examined.  Shortness of breath improved  Objective: Vitals:   02/18/22 2033 02/19/22 0030 02/19/22 0349 02/19/22 0749  BP:  130/78 129/73 121/71  Pulse:  78 76 70  Resp:  18 16 17   Temp:  97.6 F (36.4 C) 97.7 F (36.5 C) 97.7 F (36.5 C)  TempSrc:   Oral Oral  SpO2: 94% 97% 97% 96%  Weight:      Height:       No intake or output data in the 24 hours ending 02/19/22 1148  Filed Weights   02/15/22 0738  Weight: 87 kg    Examination:  General exam: NAD Respiratory system: Scattered end expiratory wheeze.  Normal work of breathing.  Room air Cardiovascular system: S2, RRR, no murmurs, no pedal edema Gastrointestinal system: Soft, NT/ND, normal bowel sounds Central nervous system: Alert and oriented. No focal neurological deficits. Extremities: Symmetric 5 x 5 power. Skin: No rashes, lesions or ulcers Psychiatry: Judgement and insight appear normal. Mood & affect appropriate.     Data Reviewed: I have personally reviewed following labs and imaging studies  CBC: Recent Labs  Lab 02/15/22 0736 02/16/22 0503 02/17/22 0547 02/18/22 0525  WBC 10.3 12.0* 14.6* 13.6*  NEUTROABS 6.5  --   --   --   HGB 13.7 12.2* 12.3* 12.2*  HCT 42.4 36.7* 36.8* 36.5*  MCV 88.9 87.6 87.2 87.1  PLT 184 160 170 189   Basic Metabolic Panel: Recent Labs  Lab 02/15/22 0736 02/16/22 0503 02/17/22 1002 02/18/22 0525 02/19/22 0530  NA 138 136 137 137 134*  K 4.3 4.2 4.2 4.5 5.2*  CL 106 103 99 103 102  CO2 20* 26 27 27 25   GLUCOSE 231* 121* 108* 139* 144*  BUN 24* 30* 41* 44* 45*  CREATININE 1.35* 1.21 1.33* 1.14 1.08  CALCIUM 8.9 9.5 9.8 10.1 10.0  MG  --   --  2.1 2.3 2.1   GFR: Estimated Creatinine Clearance: 72.3 mL/min (by C-G formula based on SCr of 1.08 mg/dL). Liver Function Tests: Recent Labs  Lab 02/15/22 0736  AST 49*  ALT 32  ALKPHOS 82   BILITOT 1.0  PROT 7.0  ALBUMIN 3.4*   No results for input(s): "LIPASE", "AMYLASE" in the last 168 hours. Recent Labs  Lab 02/15/22 1455  AMMONIA 21   Coagulation Profile: Recent Labs  Lab 02/15/22 1243  INR 1.3*   Cardiac Enzymes: No results for input(s): "CKTOTAL", "CKMB", "CKMBINDEX", "TROPONINI" in the last 168 hours. BNP (last 3 results) No results for input(s): "PROBNP" in the last 8760 hours. HbA1C: No results for input(s): "HGBA1C" in the last 72 hours. CBG: Recent Labs  Lab 02/15/22 1152 02/16/22 0822  GLUCAP 167* 188*   Lipid Profile: No results for input(s): "CHOL", "HDL", "LDLCALC", "TRIG", "CHOLHDL", "LDLDIRECT" in the last 72 hours. Thyroid Function Tests: No results for input(s): "TSH", "T4TOTAL", "FREET4", "T3FREE", "THYROIDAB" in the last 72 hours. Anemia Panel: No results for input(s): "VITAMINB12", "FOLATE", "FERRITIN", "TIBC", "IRON", "RETICCTPCT" in the last 72 hours. Sepsis Labs: Recent Labs  Lab 02/15/22 0744 02/15/22 0925 02/15/22 1243  PROCALCITON  --   --  <0.10  LATICACIDVEN 3.6* 3.3*  --     Recent Results (from the past 240 hour(s))  Resp Panel by RT-PCR (Flu A&B, Covid) Anterior Nasal Swab     Status:  None   Collection Time: 02/15/22  7:44 AM   Specimen: Anterior Nasal Swab  Result Value Ref Range Status   SARS Coronavirus 2 by RT PCR NEGATIVE NEGATIVE Final    Comment: (NOTE) SARS-CoV-2 target nucleic acids are NOT DETECTED.  The SARS-CoV-2 RNA is generally detectable in upper respiratory specimens during the acute phase of infection. The lowest concentration of SARS-CoV-2 viral copies this assay can detect is 138 copies/mL. A negative result does not preclude SARS-Cov-2 infection and should not be used as the sole basis for treatment or other patient management decisions. A negative result may occur with  improper specimen collection/handling, submission of specimen other than nasopharyngeal swab, presence of viral  mutation(s) within the areas targeted by this assay, and inadequate number of viral copies(<138 copies/mL). A negative result must be combined with clinical observations, patient history, and epidemiological information. The expected result is Negative.  Fact Sheet for Patients:  BloggerCourse.com  Fact Sheet for Healthcare Providers:  SeriousBroker.it  This test is no t yet approved or cleared by the [place] FDA and  has been authorized for detection and/or diagnosis of SARS-CoV-2 by FDA under an Emergency Use Authorization (EUA). This EUA will remain  in effect (meaning this test can be used) for the duration of the COVID-19 declaration under Section 564(b)(1) of the Act, 21 U.S.C.section 360bbb-3(b)(1), unless the authorization is terminated  or revoked sooner.       Influenza A by PCR NEGATIVE NEGATIVE Final   Influenza B by PCR NEGATIVE NEGATIVE Final    Comment: (NOTE) The Xpert Xpress SARS-CoV-2/FLU/RSV plus assay is intended as an aid in the diagnosis of influenza from Nasopharyngeal swab specimens and should not be used as a sole basis for treatment. Nasal washings and aspirates are unacceptable for Xpert Xpress SARS-CoV-2/FLU/RSV testing.  Fact Sheet for Patients: BloggerCourse.com  Fact Sheet for Healthcare Providers: SeriousBroker.it  This test is not yet approved or cleared by the [place] FDA and has been authorized for detection and/or diagnosis of SARS-CoV-2 by FDA under an Emergency Use Authorization (EUA). This EUA will remain in effect (meaning this test can be used) for the duration of the COVID-19 declaration under Section 564(b)(1) of the Act, 21 U.S.C. section 360bbb-3(b)(1), unless the authorization is terminated or revoked.  Performed at [facility], [address] [place], [place] [address]   SARS Coronavirus 2 by RT PCR  (hospital order, performed in [facility] hospital lab) *cepheid single result test* Anterior Nasal Swab     Status: None   Collection Time: 02/15/22 12:43 PM   Specimen: Anterior Nasal Swab  Result Value Ref Range Status   SARS Coronavirus 2 by RT PCR NEGATIVE NEGATIVE Final    Comment: (NOTE) SARS-CoV-2 target nucleic acids are NOT DETECTED.  The SARS-CoV-2 RNA is generally detectable in upper and lower respiratory specimens during the acute phase of infection. The lowest concentration of SARS-CoV-2 viral copies this assay can detect is 250 copies / mL. A negative result does not preclude SARS-CoV-2 infection and should not be used as the sole basis for treatment or other patient management decisions.  A negative result may occur with improper specimen collection / handling, submission of specimen other than nasopharyngeal swab, presence of viral mutation(s) within the areas targeted by this assay, and inadequate number of viral copies (<250 copies / mL). A negative result must be combined with clinical observations, patient history, and epidemiological information.  Fact Sheet for Patients:   RoadLapTop.co.za  Fact Sheet  for Healthcare Providers: http://kim-miller.com/  This test is not yet approved or  cleared by the [place] and has been authorized for detection and/or diagnosis of SARS-CoV-2 by FDA under an Emergency Use Authorization (EUA).  This EUA will remain in effect (meaning this test can be used) for the duration of the COVID-19 declaration under Section 564(b)(1) of the Act, 21 U.S.C. section 360bbb-3(b)(1), unless the authorization is terminated or revoked sooner.  Performed at [facility], [address] [place], [place] [address]          Radiology Studies: No results found.      Scheduled Meds:  apixaban  5 mg Oral BID   arformoterol  15 mcg Nebulization BID   atorvastatin  40 mg  Oral QHS   budesonide (PULMICORT) nebulizer solution  0.25 mg Nebulization BID   carvedilol  3.125 mg Oral BID WC   divalproex  500 mg Oral BID   escitalopram  10 mg Oral QHS   furosemide  40 mg Oral Daily   hydrOXYzine  10 mg Oral BID   ipratropium-albuterol  3 mL Nebulization BID   levETIRAcetam  250 mg Oral BID   levothyroxine  25 mcg Oral Q0600   lisinopril  2.5 mg Oral Daily   methylPREDNISolone (SOLU-MEDROL) injection  40 mg Intravenous Q24H   QUEtiapine  100 mg Oral QHS   Continuous Infusions:     LOS: 4 days      Tresa Moore, MD [facility]   If 7PM-7AM, please contact night-coverage  02/19/2022, 11:48 AM

## 2022-02-19 NOTE — Progress Notes (Signed)
PT Cancellation Note  Patient Details Name: Carl [name] MRN: [id number] DOB: 02-21-1961   Cancelled Treatment:    Reason Eval/Treat Not Completed: Other (comment). Pt ambulating with mobility tech at this time, PT to re-attempt as able.    Olga Coaster PT, DPT 9:10 AM,02/19/22

## 2022-02-19 NOTE — TOC Progression Note (Signed)
Transition of Care [facility]) - Progression Note    Patient Details  Name: Carl [name] MRN: [id number] Date of Birth: [birth year]  Transition of Care [facility]) CM/SW Contact  Margarito Liner, LCSW Phone Number: 02/19/2022, 1:36 PM  Clinical Narrative:  Starting home health search.   Expected Discharge Plan: Assisted Living (with home health if able to secure agency) Barriers to Discharge: Continued Medical Work up  Expected Discharge Plan and Services Expected Discharge Plan: Assisted Living (with home health if able to secure agency)     Post Acute Care Choice:  (TBD) Living arrangements for the past 2 months: Assisted Living Facility                                       Social Determinants of Health (SDOH) Interventions    Readmission Risk Interventions     No data to display

## 2022-02-19 NOTE — Telephone Encounter (Signed)
Pharmacy Patient Advocate Encounter  Insurance verification completed.    The patient is insured through [employer] [facility]   The patient is currently admitted and ran test claims for the following: Sherryll Burger, Farxiga, Jardiance.  Copays and coinsurance results were relayed to Inpatient clinical team.

## 2022-02-20 DIAGNOSIS — F319 Bipolar disorder, unspecified: Secondary | ICD-10-CM

## 2022-02-20 DIAGNOSIS — R1011 Right upper quadrant pain: Secondary | ICD-10-CM | POA: Diagnosis not present

## 2022-02-20 DIAGNOSIS — F419 Anxiety disorder, unspecified: Secondary | ICD-10-CM | POA: Diagnosis not present

## 2022-02-20 DIAGNOSIS — I5023 Acute on chronic systolic (congestive) heart failure: Secondary | ICD-10-CM | POA: Diagnosis not present

## 2022-02-20 DIAGNOSIS — I5043 Acute on chronic combined systolic (congestive) and diastolic (congestive) heart failure: Secondary | ICD-10-CM | POA: Diagnosis not present

## 2022-02-20 LAB — BASIC METABOLIC PANEL
Anion gap: 8 (ref 5–15)
BUN: 51 mg/dL — ABNORMAL HIGH (ref 8–23)
CO2: 22 mmol/L (ref 22–32)
Calcium: 9.7 mg/dL (ref 8.9–10.3)
Chloride: 104 mmol/L (ref 98–111)
Creatinine, Ser: 1.1 mg/dL (ref 0.61–1.24)
GFR, Estimated: >60 mL/min (ref >60)
Glucose, Bld: 107 mg/dL — ABNORMAL HIGH (ref 70–99)
Potassium: 4.6 mmol/L (ref 3.5–5.1)
Sodium: 134 mmol/L — ABNORMAL LOW (ref 135–145)

## 2022-02-20 LAB — LIPID PANEL
Cholesterol: 183 mg/dL (ref 0–200)
HDL: 52 mg/dL (ref >40)
LDL Cholesterol: 104 mg/dL — ABNORMAL HIGH (ref 0–99)
Total CHOL/HDL Ratio: 3.5 RATIO
Triglycerides: 134 mg/dL (ref <150)
VLDL: 27 mg/dL (ref 0–40)

## 2022-02-20 LAB — MAGNESIUM: Magnesium: 2.4 mg/dL (ref 1.7–2.4)

## 2022-02-20 MED ORDER — PREDNISONE 20 MG PO TABS: 20.0000 mg | ORAL_TABLET | Freq: Every day | ORAL | 0 refills | Status: AC

## 2022-02-20 NOTE — Progress Notes (Signed)
Remote ICD transmission.   

## 2022-02-20 NOTE — TOC Transition Note (Signed)
Transition of Care [facility]) - CM/SW Discharge Note   Patient Details  Name: Carl [name] MRN: [id number] Date of Birth: 11-29-1960  Transition of Care [facility]) CM/SW Contact:  Margarito Liner, LCSW Phone Number: 02/20/2022, 1:40 PM   Clinical Narrative:   Patient has orders to discharge back to [facility] today. Faxed FL2 and discharge summary to facility and put copy in discharge back to go with him to facility per owner request. RN will call owner to coordinate pickup time. No further concerns. CSW signing off.  Final next level of care: Assisted Living (with home health) Barriers to Discharge: Barriers Resolved   Patient Goals and CMS Choice     Choice offered to / list presented to : [facility] POA / Guardian  Discharge Placement                Patient to be transferred to facility by: ALF Staff Name of family member notified: [name] and Sharlyne Cai ([facility] Guardians) Patient and family notified of of transfer: 02/20/22  Discharge Plan and Services     Post Acute Care Choice:  (TBD)                    HH Arranged: RN, PT, OT HH Agency: [facility] Date [facility] Agency Contacted: 02/20/22   Representative spoke with at [facility] Agency: [facility]  Social Determinants of Health (SDOH) Interventions     Readmission Risk Interventions     No data to display

## 2022-02-20 NOTE — [place] FL2 (Signed)
[place] MEDICAID FL2 LEVEL OF CARE SCREENING TOOL     IDENTIFICATION  Patient Name: Carl [name] Birthdate: 1960-12-19 Sex: male Admission Date (Current Location): 02/15/2022  [facility] and [place] Number:  Chiropodist and Address:  [facility], [address], [place], [place] [address]      Provider Number: [id number]  Attending Physician Name and Address:  Lynn Ito, MD  Relative Name and Phone Number:       Current Level of Care: Hospital Recommended Level of Care: Assisted Living Facility (with PT, OT, RN through [facility].) Prior Approval Number:    Date Approved/Denied:   PASRR Number:    Discharge Plan: Other (Comment) (ALF with PT, OT, and RN through [facility])    Current Diagnoses: Patient Active Problem List   Diagnosis Date Noted   Dyspnea    Acute exacerbation of CHF (congestive heart failure) (HCC) 02/15/2022   Anxiety 02/15/2022   Tobacco use 02/15/2022   Elevated partial thromboplastin time (PTT) 02/15/2022   Abdominal pain 02/15/2022   HFrEF (heart failure with reduced ejection fraction) (HCC)    Weakness    COPD with acute exacerbation (HCC) 05/23/2021   PAF (paroxysmal atrial fibrillation) (HCC) 05/23/2021   Chronic anticoagulation 05/23/2021   Abdominal aortic aneurysm (AAA) 35 to 39 mm in diameter (HCC) 05/23/2021   Ischemic cardiomyopathy 05/22/2020   Chronic systolic heart failure (HCC) 05/22/2020   HCAP (healthcare-associated pneumonia) 01/24/2020   Hyperkalemia 01/24/2020   Acute on chronic combined systolic (congestive) and diastolic (congestive) heart failure (HCC) 01/24/2020   Seizure (HCC) 01/24/2020   HLD (hyperlipidemia) 01/24/2020   Ascites due to alcoholic cirrhosis (HCC)    Acute on chronic systolic CHF (congestive heart failure) (HCC) 11/18/2019   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/18/2019   Elevated troponin 11/18/2019   Hypoxia 11/18/2019   Acute renal failure  superimposed on stage 3a chronic kidney disease (HCC) 11/18/2019   Coronary artery disease 05/15/2019   Essential hypertension 05/15/2019   History of stroke 05/15/2019   Homicidal ideation 05/15/2019   Hypothyroidism 05/15/2019   Constipation 05/06/2019   Scrotal mass 05/06/2019   Bipolar 1 disorder (HCC) 12/23/2018   Acute on chronic respiratory failure with hypoxia (HCC) 11/03/2018   Chronic pain of left knee 09/17/2018   Atypical chest pain 10/11/2017   Polypharmacy 08/05/2017   Cerebrovascular accident (CVA) due to stenosis of left middle cerebral artery (HCC) 06/26/2017   Urinary retention due to benign prostatic hyperplasia 06/22/2017   Falls 06/20/2017   Risk for falls 02/19/2017   Peripheral artery disease (HCC) 08/28/2016   Claudication (HCC) 08/09/2016   History of alcohol abuse 08/09/2016   History of drug dependence/abuse (HCC) 08/09/2016   Severe single current episode of major depressive disorder, without psychotic features (HCC) 08/09/2016   Mitral insufficiency 01/04/2016   Prediabetes 12/29/2015   Fracture of left clavicle 07/13/2012    Orientation RESPIRATION BLADDER Height & Weight     Self, Time, Situation, Place  Normal Continent Weight: 191 lb 12.8 oz (87 kg) Height:  5\' 10"  (177.8 cm)  BEHAVIORAL SYMPTOMS/MOOD NEUROLOGICAL BOWEL NUTRITION STATUS   (None)   Continent Diet (Heart healthy)  AMBULATORY STATUS COMMUNICATION OF NEEDS Skin   Limited Assist Verbally Bruising                       Personal Care Assistance Level of Assistance  Bathing, Feeding, Dressing Bathing Assistance: Limited assistance Feeding assistance: Limited assistance Dressing Assistance: Limited assistance  Functional Limitations Info  Sight, Hearing, Speech Sight Info: Adequate Hearing Info: Adequate Speech Info: Adequate    SPECIAL CARE FACTORS FREQUENCY  PT (By licensed PT), OT (By licensed OT)     PT Frequency: 3 x week OT Frequency: 3 x week             Contractures Contractures Info: Not present    Additional Factors Info  Code Status, Allergies, Psychotropic Code Status Info: Full code Allergies Info: Iodinated Contrast Media, Penicillins, Strawberry Extract, Cefepime, Erythromycin, Sulfa Antibiotics Psychotropic Info: Bipolar I, Anxiety         Current Medications (02/20/2022):  This is the current hospital active medication list Current Facility-Administered Medications  Medication Dose Route Frequency Provider Last Rate Last Admin   apixaban (ELIQUIS) tablet 5 mg  5 mg Oral BID Orson Aloe, RPH   5 mg at 02/20/22 2542   arformoterol (BROVANA) nebulizer solution 15 mcg  15 mcg Nebulization BID Lolita Patella B, MD   15 mcg at 02/20/22 0754   budesonide (PULMICORT) nebulizer solution 0.25 mg  0.25 mg Nebulization BID Lolita Patella B, MD   0.25 mg at 02/20/22 0754   carvedilol (COREG) tablet 3.125 mg  3.125 mg Oral BID WC Cox, Amy N, DO   3.125 mg at 02/20/22 0810   divalproex (DEPAKOTE ER) 24 hr tablet 500 mg  500 mg Oral BID Cox, Amy N, DO   500 mg at 02/20/22 0810   escitalopram (LEXAPRO) tablet 10 mg  10 mg Oral QHS Cox, Amy N, DO   10 mg at 02/19/22 2121   furosemide (LASIX) tablet 40 mg  40 mg Oral Daily Chilton Si, MD   40 mg at 02/20/22 7062   hydrOXYzine (ATARAX) tablet 10 mg  10 mg Oral BID Cox, Amy N, DO   10 mg at 02/20/22 3762   ipratropium-albuterol (DUONEB) 0.5-2.5 (3) MG/3ML nebulizer solution 3 mL  3 mL Nebulization BID Lolita Patella B, MD   3 mL at 02/20/22 0754   levETIRAcetam (KEPPRA) tablet 250 mg  250 mg Oral BID Cox, Amy N, DO   250 mg at 02/20/22 8315   levothyroxine (SYNTHROID) tablet 25 mcg  25 mcg Oral Q0600 Cox, Amy N, DO   25 mcg at 02/20/22 0616   lisinopril (ZESTRIL) tablet 2.5 mg  2.5 mg Oral Daily Cox, Amy N, DO   2.5 mg at 02/20/22 1761   nicotine (NICODERM CQ - dosed in mg/24 hours) patch 21 mg  21 mg Transdermal Daily PRN Cox, Amy N, DO       predniSONE (DELTASONE)  tablet 20 mg  20 mg Oral Q breakfast Georgeann Oppenheim, Sudheer B, MD   20 mg at 02/20/22 6073   QUEtiapine (SEROQUEL) tablet 100 mg  100 mg Oral QHS Cox, Amy N, DO   100 mg at 02/19/22 2121   rosuvastatin (CRESTOR) tablet 40 mg  40 mg Oral Daily Lolita Patella B, MD   40 mg at 02/19/22 2121   senna-docusate (Senokot-S) tablet 1 tablet  1 tablet Oral QHS PRN Cox, Amy N, DO       traMADol (ULTRAM) tablet 50 mg  50 mg Oral BID PRN Cox, Amy N, DO   50 mg at 02/18/22 0827     Discharge Medications: TAKE these medications     acetaminophen 500 MG tablet Commonly known as: TYLENOL Take 500 mg by mouth every 6 (six) hours as needed for mild pain.    albuterol 108 (90 Base) MCG/ACT  inhaler Commonly known as: VENTOLIN HFA Inhale 2 puffs into the lungs every 6 (six) hours as needed for wheezing.    atorvastatin 40 MG tablet Commonly known as: LIPITOR Take 1 tablet (40 mg total) by mouth daily. What changed:  when to take this reasons to take this    carvedilol 3.125 MG tablet Commonly known as: COREG Take 3.125 mg by mouth 2 (two) times daily with a meal.    divalproex 500 MG 24 hr tablet Commonly known as: DEPAKOTE ER Take 1 tablet (500 mg total) by mouth 2 (two) times daily.    Eliquis 5 MG Tabs tablet Generic drug: apixaban TAKE 1 TABLET BY MOUTH TWICE A DAY    escitalopram 10 MG tablet Commonly known as: LEXAPRO Take 1 tablet (10 mg total) by mouth at bedtime.    feeding supplement Liqd Take 237 mLs by mouth 2 (two) times daily between meals.    Fluticasone-Salmeterol 250-50 MCG/DOSE Aepb Commonly known as: ADVAIR Inhale 1 puff into the lungs 2 (two) times daily.    furosemide 40 MG tablet Commonly known as: LASIX Take 1 tablet (40 mg total) by mouth daily.    Hydrocortisone Acetate 1 % Crea SMARTSIG:sparingly Topical 3 Times Daily    hydrOXYzine 10 MG tablet Commonly known as: ATARAX Take 10 mg by mouth 2 (two) times daily.    levETIRAcetam 250 MG tablet Commonly  known as: Keppra Take 1 tablet (250 mg total) by mouth 2 (two) times daily.    levothyroxine 25 MCG tablet Commonly known as: SYNTHROID Take 1 tablet (25 mcg total) by mouth daily.    lisinopril 2.5 MG tablet Commonly known as: ZESTRIL TAKE 1 TABLET BY MOUTH ONCE DAILY    omeprazole 20 MG capsule Commonly known as: PRILOSEC Take 20 mg by mouth daily.    predniSONE 20 MG tablet Commonly known as: DELTASONE Take 1 tablet (20 mg total) by mouth daily with breakfast for 4 days. Start taking on: February 21, 2022    QUEtiapine 200 MG tablet Commonly known as: SEROQUEL Take 200 mg by mouth at bedtime.    tiotropium 18 MCG inhalation capsule Commonly known as: SPIRIVA Place 18 mcg into inhaler and inhale daily.    traMADol 50 MG tablet Commonly known as: ULTRAM Take 50 mg by mouth 2 (two) times daily as needed.    Relevant Imaging Results:  Relevant Lab Results:   Additional [place], LCSW

## 2022-02-20 NOTE — Progress Notes (Signed)
Mobility Specialist - Progress Note    02/20/22 0902  Mobility  Activity Ambulated independently in hallway;Stood at bedside;Dangled on edge of bed  Level of Assistance Standby assist, set-up cues, supervision of patient - no hands on  Distance Ambulated (ft) 320 ft  Activity Response Tolerated well  $Mobility charge 1 Mobility   Pt supine in bed on RA upon arrival. Pt dons shoes and STS indep. Pt ambulates 2 laps around NS SBA. Two LOB corrected by pt. Pt returns to bed with needs in reach.   Terrilyn Saver  Mobility Specialist  02/20/22 9:04 AM

## 2022-02-20 NOTE — Discharge Summary (Signed)
Carl [name] TDS:[id number] DOB: 30-Aug-1960 DOA: 02/15/2022  PCP: [facility]  Admit date: 02/15/2022 Discharge date: 02/20/2022  Admitted From: assisted living Disposition:  assisted living  Recommendations for Outpatient Follow-up:  Follow up with PCP in 1 week Please obtain BMP/CBC in one week Please follow up cardiology week  Home Health:yes    Discharge Condition:Stable CODE STATUS:full  Diet recommendation: Heart Healthy  Brief/Interim Summary: Per HPI:61 year old male with history of CAD status post CABG in 2017, biventricular ICD presents status post upgrade on October 2021, atrial fibrillation on Eliquis, hyperlipidemia, hypothyroid, ckd3a, hypertension, heart failure reduced ejection fraction, persistent daily tobacco cigarette smoking, who presents emergency department for chief concerns of shortness of breath.   Per report he was found to be hypoxic with SpO2 in the 80s via EMS and was placed on CPAP and transferred to the emergency department for further evaluation.   Weaned off CPAP.  Currently stable on nasal cannula.  Seen in consultation by cardiology.  Likely multifactorial presentation consistent with COPD and CHF.   Acute on chronic systolic congestive heart failure Ischemic cardiomyopathy status post ICD Cardiology was consulted Was treated with IV Lasix EF 25 to 30% on TTE Daily weight Continue CHF medications and Lasix 40 mg daily Follow-up with cardiology/CHF clinic in 1 week  Discussed 64 ounces/eight 8 ounce cups or 2 L limit per day   Decompensated COPD presentation likely multifactorial owing to decompensated COPD and CHF.  COPD in the setting of chronic tobacco abuse. Was treated with IV Solu-Medrol transition to prednisone p.o. today.  Plan for 20 mg daily for 4 more days to start tomorrow Continue inhalers      Acute hypoxic respiratory failure Initially required NIPPV, now wean Decompensated CHF and COPD treatment as  above Treated as above   Elevated troponin CAD status post CABG Not entirely consistent with ACS Treated with IV heparin Transitioned back to p.o. Eliquis Lipitor and Coreg   Paroxysmal atrial fibrillation P.o. Eliquis and beta-blockers   Abdominal pain Located to right upper quadrant.  Unclear etiology.   Right upper quadrant ultrasound with hepatic steatosis otherwise unremarkable Pain appears resolved   Tobacco use Needs to stop smoking counseled   Anxiety Seroquel 100 milligrams nightly   HLD (hyperlipidemia) Continue statins   Hypothyroidism Levothyroxine 25 mcg daily   Essential hypertension Continue with lisinopril and Coreg   Bipolar 1 disorder (HCC) Continue with Depakote and Keppra          Discharge Diagnoses:  Principal Problem:   Acute exacerbation of CHF (congestive heart failure) (HCC) Active Problems:   Elevated troponin   Bipolar 1 disorder (HCC)   Coronary artery disease   Essential hypertension   Hypothyroidism   Acute on chronic combined systolic (congestive) and diastolic (congestive) heart failure (HCC)   Seizure (HCC)   HLD (hyperlipidemia)   Ischemic cardiomyopathy   History of alcohol abuse   Peripheral artery disease (HCC)   Urinary retention due to benign prostatic hyperplasia   Chronic anticoagulation   Weakness   Anxiety   Tobacco use   Elevated partial thromboplastin time (PTT)   Abdominal pain   Dyspnea    Discharge Instructions  Discharge Instructions     Diet - low sodium heart healthy   Complete by: As directed    Increase activity slowly   Complete by: As directed       Allergies as of 02/20/2022       Reactions   Iodinated Contrast Media Shortness Of Breath  Penicillins Anaphylaxis, Shortness Of Breath   Respiratory  Tolerated cefuroxime on 01/06/16   Strawberry Extract Anaphylaxis   Cefepime Itching   Empiric antibiotic, developed pruritis.    Erythromycin Itching   Sulfa Antibiotics Itching,  Nausea And Vomiting, Nausea Only        Medication List     TAKE these medications    acetaminophen 500 MG tablet Commonly known as: TYLENOL Take 500 mg by mouth every 6 (six) hours as needed for mild pain.   albuterol 108 (90 Base) MCG/ACT inhaler Commonly known as: VENTOLIN HFA Inhale 2 puffs into the lungs every 6 (six) hours as needed for wheezing.   atorvastatin 40 MG tablet Commonly known as: LIPITOR Take 1 tablet (40 mg total) by mouth daily. What changed:  when to take this reasons to take this   carvedilol 3.125 MG tablet Commonly known as: COREG Take 3.125 mg by mouth 2 (two) times daily with a meal.   divalproex 500 MG 24 hr tablet Commonly known as: DEPAKOTE ER Take 1 tablet (500 mg total) by mouth 2 (two) times daily.   Eliquis 5 MG Tabs tablet Generic drug: apixaban TAKE 1 TABLET BY MOUTH TWICE A DAY   escitalopram 10 MG tablet Commonly known as: LEXAPRO Take 1 tablet (10 mg total) by mouth at bedtime.   feeding supplement Liqd Take 237 mLs by mouth 2 (two) times daily between meals.   Fluticasone-Salmeterol 250-50 MCG/DOSE Aepb Commonly known as: ADVAIR Inhale 1 puff into the lungs 2 (two) times daily.   furosemide 40 MG tablet Commonly known as: LASIX Take 1 tablet (40 mg total) by mouth daily.   Hydrocortisone Acetate 1 % Crea SMARTSIG:sparingly Topical 3 Times Daily   hydrOXYzine 10 MG tablet Commonly known as: ATARAX Take 10 mg by mouth 2 (two) times daily.   levETIRAcetam 250 MG tablet Commonly known as: Keppra Take 1 tablet (250 mg total) by mouth 2 (two) times daily.   levothyroxine 25 MCG tablet Commonly known as: SYNTHROID Take 1 tablet (25 mcg total) by mouth daily.   lisinopril 2.5 MG tablet Commonly known as: ZESTRIL TAKE 1 TABLET BY MOUTH ONCE DAILY   omeprazole 20 MG capsule Commonly known as: PRILOSEC Take 20 mg by mouth daily.   predniSONE 20 MG tablet Commonly known as: DELTASONE Take 1 tablet (20 mg total)  by mouth daily with breakfast for 4 days. Start taking on: February 21, 2022   QUEtiapine 200 MG tablet Commonly known as: SEROQUEL Take 200 mg by mouth at bedtime.   tiotropium 18 MCG inhalation capsule Commonly known as: SPIRIVA Place 18 mcg into inhaler and inhale daily.   traMADol 50 MG tablet Commonly known as: ULTRAM Take 50 mg by mouth 2 (two) times daily as needed.        Follow-up Information     [facility] Follow up.   Specialty: Home Health Services Why: They will follow up with you for your home health needs. Start of care Tuesday 8/22. Contact information: [address] [place] [address] [phone]         Iran Ouch, MD Follow up in 1 week(s).   Specialty: Cardiology Contact information: [address] [place] [address] [phone]         [facility] Follow up in 1 week(s).   Specialty: Geriatric Medicine Contact information: [address] [place] [address] [phone]  Allergies  Allergen Reactions   Iodinated Contrast Media Shortness Of Breath   Penicillins Anaphylaxis and Shortness Of Breath    Respiratory  Tolerated cefuroxime on 01/06/16   Strawberry Extract Anaphylaxis   Cefepime Itching    Empiric antibiotic, developed pruritis.    Erythromycin Itching   Sulfa Antibiotics Itching, Nausea And Vomiting and Nausea Only    Consultations:  Cardiology  Procedures/Studies: ECHOCARDIOGRAM COMPLETE  Result Date: 02/17/2022    ECHOCARDIOGRAM REPORT   [name] Date of Exam: 02/16/2022 Medical Rec #:  [id number]    Height:       70.0 in Accession #:    [id number]   Weight:       191.8 lb Date of Birth:  [birth year]    BSA:          2.051 m Patient Age:    61 years     BP:           108/67 mmHg Patient Gender: M            HR:           72 bpm. Exam Location:  [facility] Procedure: 2D Echo, Color Doppler, Cardiac Doppler  and Intracardiac            Opacification Agent Indications:     R06.00 Dyspnea; Elevated troponin  History:         Patient has prior history of Echocardiogram examinations, most                  recent 08/16/2020. CHF, Previous Myocardial Infarction,                  Defibrillator, CKD, COPD and Stroke; Risk Factors:Current                  Smoker and Hypertension.  Sonographer:     Humphrey Rolls Referring Phys:  [id number] AMY N COX Diagnosing Phys: Chilton Si MD IMPRESSIONS  1. Left ventricular ejection fraction, by estimation, is 25 to 30%. The left ventricle has severely decreased function. The left ventricle demonstrates regional wall motion abnormalities (see scoring diagram/findings for description). The left ventricular internal cavity size was moderately dilated. There is mild concentric left ventricular hypertrophy. Left ventricular diastolic parameters are indeterminate. Elevated left ventricular end-diastolic pressure.  2. Right ventricular systolic function is normal. The right ventricular size is normal.  3. Left atrial size was severely dilated.  4. Right atrial size was mildly dilated.  5. The mitral valve is normal in structure. Mild mitral valve regurgitation. No evidence of mitral stenosis.  6. The aortic valve is tricuspid. Aortic valve regurgitation is mild. No aortic stenosis is present.  7. The inferior vena cava is normal in size with <50% respiratory variability, suggesting right atrial pressure of 8 mmHg. FINDINGS  Left Ventricle: Left ventricular ejection fraction, by estimation, is 25 to 30%. The left ventricle has severely decreased function. The left ventricle demonstrates regional wall motion abnormalities. Definity contrast agent was given IV to delineate the left ventricular endocardial borders. The left ventricular internal cavity size was moderately dilated. There is mild concentric left ventricular hypertrophy. Left ventricular diastolic function could not be evaluated due to  atrial fibrillation. Left  ventricular diastolic parameters are indeterminate. Elevated left ventricular end-diastolic pressure.  LV Wall Scoring: The mid and distal anterior wall, apical lateral segment, mid anterolateral segment, and apex are akinetic. The posterior wall, basal anterolateral segment, basal anterior segment, and apical inferior segment  are hypokinetic. The entire septum and inferior wall are normal. Right Ventricle: The right ventricular size is normal. No increase in right ventricular wall thickness. Right ventricular systolic function is normal. Left Atrium: Left atrial size was severely dilated. Right Atrium: Right atrial size was mildly dilated. Pericardium: There is no evidence of pericardial effusion. Mitral Valve: The mitral valve is normal in structure. Mild mitral annular calcification. Mild mitral valve regurgitation. No evidence of mitral valve stenosis. Tricuspid Valve: The tricuspid valve is normal in structure. Tricuspid valve regurgitation is mild . No evidence of tricuspid stenosis. Aortic Valve: The aortic valve is tricuspid. Aortic valve regurgitation is mild. No aortic stenosis is present. Aortic valve mean gradient measures 3.0 mmHg. Aortic valve peak gradient measures 6.0 mmHg. Aortic valve area, by VTI measures 2.54 cm. Pulmonic Valve: The pulmonic valve was normal in structure. Pulmonic valve regurgitation is not visualized. No evidence of pulmonic stenosis. Aorta: The aortic root is normal in size and structure. Venous: The inferior vena cava is normal in size with less than 50% respiratory variability, suggesting right atrial pressure of 8 mmHg. IAS/Shunts: No atrial level shunt detected by color flow Doppler.  LEFT VENTRICLE PLAX 2D LVIDd:         6.21 cm      Diastology LVIDs:         4.79 cm      LV e' lateral:   5.11 cm/s LV PW:         1.14 cm      LV E/e' lateral: 34.8 LV IVS:        1.03 cm LVOT diam:     2.10 cm LV SV:         62 LV SV Index:   30 LVOT Area:      3.46 cm  LV Volumes (MOD) LV vol d, MOD A2C: 233.0 ml LV vol d, MOD A4C: 233.0 ml LV vol s, MOD A2C: 151.0 ml LV vol s, MOD A4C: 149.0 ml LV SV MOD A2C:     82.0 ml LV SV MOD A4C:     233.0 ml LV SV MOD BP:      79.7 ml RIGHT VENTRICLE RV Basal diam:  4.12 cm LEFT ATRIUM             Index        RIGHT ATRIUM           Index LA diam:        6.50 cm 3.17 cm/m   RA Area:     20.50 cm LA Vol (A2C):   90.2 ml 43.99 ml/m  RA Volume:   59.60 ml  29.07 ml/m LA Vol (A4C):   70.9 ml 34.58 ml/m LA Biplane Vol: 80.5 ml 39.26 ml/m  AORTIC VALVE                    PULMONIC VALVE AV Area (Vmax):    2.49 cm     PV Vmax:       0.91 m/s AV Area (Vmean):   2.59 cm     PV Peak grad:  3.3 mmHg AV Area (VTI):     2.54 cm AV Vmax:           122.00 cm/s AV Vmean:          84.300 cm/s AV VTI:            0.243 m AV Peak Grad:      6.0 mmHg AV Mean  Grad:      3.0 mmHg LVOT Vmax:         87.70 cm/s LVOT Vmean:        63.100 cm/s LVOT VTI:          0.178 m LVOT/AV VTI ratio: 0.73  AORTA Ao Root diam: 3.50 cm MITRAL VALVE                TRICUSPID VALVE MV Area (PHT): 3.16 cm     TR Peak grad:   25.4 mmHg MV Decel Time: 240 msec     TR Vmax:        252.00 cm/s MR Peak grad: 70.9 mmHg MR Vmax:      421.00 cm/s   SHUNTS MV E velocity: 178.00 cm/s  Systemic VTI:  0.18 m                             Systemic Diam: 2.10 cm Chilton Si MD Electronically signed by Chilton Si MD Signature Date/Time: 02/17/2022/11:16:32 AM    Final    US Abdomen Limited RUQ (LIVER/GB)  Result Date: 02/16/2022 CLINICAL DATA:  Right upper quadrant pain. EXAM: ULTRASOUND ABDOMEN LIMITED RIGHT UPPER QUADRANT COMPARISON:  None Available. FINDINGS: Gallbladder: The gallbladder is contracted. No stone or sonographic Eulah Pont sign is present. Common bile duct: Diameter: 1.9 mm, within normal limits. Liver: The liver is diffusely echogenic. No focal lesions are present. Portal vein is patent on color Doppler imaging with normal direction of blood flow towards  the liver. Other: None. IMPRESSION: 1. Diffusely echogenic liver suggesting fatty infiltration. 2. Contracted gallbladder. Patient was not fasting. No acute abnormality. Electronically Signed   By: Marin Roberts M.D.   On: 02/16/2022 13:24   CT Angio Chest PE W and/or Wo Contrast  Result Date: 02/15/2022 CLINICAL DATA:  Positive D-dimer. Clinical suspicion for pulmonary embolism. EXAM: CT ANGIOGRAPHY CHEST WITH CONTRAST TECHNIQUE: Multidetector CT imaging of the chest was performed using the standard protocol during bolus administration of intravenous contrast. Multiplanar CT image reconstructions and MIPs were obtained to evaluate the vascular anatomy. RADIATION DOSE REDUCTION: This exam was performed according to the departmental dose-optimization program which includes automated exposure control, adjustment of the mA and/or kV according to patient size and/or use of iterative reconstruction technique. CONTRAST:  52mL OMNIPAQUE IOHEXOL 350 MG/ML SOLN COMPARISON:  Current and prior chest radiographs. FINDINGS: Cardiovascular: Pulmonary arteries are well opacified. There is no evidence of a pulmonary embolism. Heart mildly enlarged. There changes from prior cardiac surgery and mitral valve replacement. No pericardial effusion. Great vessels are normal in caliber. Aorta not opacified. Aortic atherosclerotic calcifications. Mediastinum/Nodes: No neck base, mediastinal or hilar masses or enlarged lymph nodes. Trachea and esophagus are unremarkable. Lungs/Pleura: Mild dependent atelectasis, lower lobes and left upper lobe lingula. Lungs otherwise clear. No pleural effusion. No pneumothorax. Upper Abdomen: No acute findings. Fat containing midline upper abdominal hernia. Aortic atherosclerosis. Musculoskeletal: No acute fracture or acute finding. Prior left clavicle fracture ORIF. No osteoblastic or osteolytic lesions. No chest mass. Review of the MIP images confirms the above findings. IMPRESSION: 1. No  evidence of a pulmonary embolism. 2. No acute findings. 3. Cardiomegaly and changes from prior cardiac surgery. 4. Aortic atherosclerosis. Aortic Atherosclerosis (ICD10-I70.0). Electronically Signed   By: Amie Portland M.D.   On: 02/15/2022 12:27   DG Chest Port 1 View  Result Date: 02/15/2022 CLINICAL DATA:  Dyspnea EXAM: PORTABLE CHEST 1 VIEW COMPARISON:  05/23/2021 chest  radiograph. FINDINGS: Intact sternotomy wires. Stable configuration of 3 lead left subclavian ICD and cardiac valve prosthesis. Stable cardiomediastinal silhouette with mild cardiomegaly. No pneumothorax. No pleural effusion. Borderline mild pulmonary edema. IMPRESSION: Borderline mild congestive heart failure. Electronically Signed   By: Delbert Phenix M.D.   On: 02/15/2022 07:58   CUP PACEART REMOTE DEVICE CHECK  Result Date: 02/01/2022 Scheduled remote reviewed. Normal device function.  Hx of PAF, burden 0.9%, Eliquis, Coreg Next remote 91 days. LA     Subjective: Feels better.  No chest pain or shortness of breath  Discharge Exam: Vitals:   02/20/22 0809 02/20/22 1138  BP: 113/82 124/70  Pulse: (!) 57 65  Resp: 18 16  Temp:  97.9 F (36.6 C)  SpO2: 98% 98%   Vitals:   02/20/22 0357 02/20/22 0756 02/20/22 0809 02/20/22 1138  BP: 123/81  113/82 124/70  Pulse: 72  (!) 57 65  Resp: Temp: 98 F (36.7 C)   97.9 F (36.6 C)  TempSrc: Oral   Oral  SpO2: 96% 96% 98% 98%  Weight:      Height:        General: Pt is alert, awake, not in acute distress Cardiovascular: RRR, S1/S2 +, no rubs, no gallops Respiratory: CTA bilaterally, no wheezing, no rhonchi Abdominal: Soft, NT, ND, bowel sounds + Extremities: no edema, no cyanosis    The results of significant diagnostics from this hospitalization (including imaging, microbiology, ancillary and laboratory) are listed below for reference.     Microbiology: Recent Results (from the past 240 hour(s))  Resp Panel by RT-PCR (Flu A&B, Covid) Anterior  Nasal Swab     Status: None   Collection Time: 02/15/22  7:44 AM   Specimen: Anterior Nasal Swab  Result Value Ref Range Status   SARS Coronavirus 2 by RT PCR NEGATIVE NEGATIVE Final    Comment: (NOTE) SARS-CoV-2 target nucleic acids are NOT DETECTED.  The SARS-CoV-2 RNA is generally detectable in upper respiratory specimens during the acute phase of infection. The lowest concentration of SARS-CoV-2 viral copies this assay can detect is 138 copies/mL. A negative result does not preclude SARS-Cov-2 infection and should not be used as the sole basis for treatment or other patient management decisions. A negative result may occur with  improper specimen collection/handling, submission of specimen other than nasopharyngeal swab, presence of viral mutation(s) within the areas targeted by this assay, and inadequate number of viral copies(<138 copies/mL). A negative result must be combined with clinical observations, patient history, and epidemiological information. The expected result is Negative.  Fact Sheet for Patients:  BloggerCourse.com  Fact Sheet for Healthcare Providers:  SeriousBroker.it  This test is no t yet approved or cleared by the [place] FDA and  has been authorized for detection and/or diagnosis of SARS-CoV-2 by FDA under an Emergency Use Authorization (EUA). This EUA will remain  in effect (meaning this test can be used) for the duration of the COVID-19 declaration under Section 564(b)(1) of the Act, 21 U.S.C.section 360bbb-3(b)(1), unless the authorization is terminated  or revoked sooner.       Influenza A by PCR NEGATIVE NEGATIVE Final   Influenza B by PCR NEGATIVE NEGATIVE Final    Comment: (NOTE) The Xpert Xpress SARS-CoV-2/FLU/RSV plus assay is intended as an aid in the diagnosis of influenza from Nasopharyngeal swab specimens and should not be used as a sole basis for treatment. Nasal washings  and aspirates are unacceptable for Xpert Xpress SARS-CoV-2/FLU/RSV testing.  Fact Sheet for Patients: BloggerCourse.com  Fact Sheet for Healthcare Providers: SeriousBroker.it  This test is not yet approved or cleared by the [place] FDA and has been authorized for detection and/or diagnosis of SARS-CoV-2 by FDA under an Emergency Use Authorization (EUA). This EUA will remain in effect (meaning this test can be used) for the duration of the COVID-19 declaration under Section 564(b)(1) of the Act, 21 U.S.C. section 360bbb-3(b)(1), unless the authorization is terminated or revoked.  Performed at [facility], [address] [place], [place] [address]   SARS Coronavirus 2 by RT PCR (hospital order, performed in [facility] hospital lab) *cepheid single result test* Anterior Nasal Swab     Status: None   Collection Time: 02/15/22 12:43 PM   Specimen: Anterior Nasal Swab  Result Value Ref Range Status   SARS Coronavirus 2 by RT PCR NEGATIVE NEGATIVE Final    Comment: (NOTE) SARS-CoV-2 target nucleic acids are NOT DETECTED.  The SARS-CoV-2 RNA is generally detectable in upper and lower respiratory specimens during the acute phase of infection. The lowest concentration of SARS-CoV-2 viral copies this assay can detect is 250 copies / mL. A negative result does not preclude SARS-CoV-2 infection and should not be used as the sole basis for treatment or other patient management decisions.  A negative result may occur with improper specimen collection / handling, submission of specimen other than nasopharyngeal swab, presence of viral mutation(s) within the areas targeted by this assay, and inadequate number of viral copies (<250 copies / mL). A negative result must be combined with clinical observations, patient history, and epidemiological information.  Fact Sheet for Patients:    RoadLapTop.co.za  Fact Sheet for Healthcare Providers: http://kim-miller.com/  This test is not yet approved or  cleared by the [place] FDA and has been authorized for detection and/or diagnosis of SARS-CoV-2 by FDA under an Emergency Use Authorization (EUA).  This EUA will remain in effect (meaning this test can be used) for the duration of the COVID-19 declaration under Section 564(b)(1) of the Act, 21 U.S.C. section 360bbb-3(b)(1), unless the authorization is terminated or revoked sooner.  Performed at [facility], [address] [place], [place] [address]      Labs: BNP (last 3 results) Recent Labs    02/15/22 0739  BNP 888.4*   Basic Metabolic Panel: Recent Labs  Lab 02/16/22 0503 02/17/22 1002 02/18/22 0525 02/19/22 0530 02/20/22 0446  NA 136 137 137 134* 134*  K 4.2 4.2 4.5 5.2* 4.6  CL 103 99 103 102 104  CO2 GLUCOSE 121* 108* 139* 144* 107*  BUN 30* 41* 44* 45* 51*  CREATININE 1.21 1.33* 1.14 1.08 1.10  CALCIUM 9.5 9.8 10.1 10.0 9.7  MG  --  2.1 2.3 2.1 2.4   Liver Function Tests: Recent Labs  Lab 02/15/22 0736  AST 49*  ALT 32  ALKPHOS 82  BILITOT 1.0  PROT 7.0  ALBUMIN 3.4*   No results for input(s): "LIPASE", "AMYLASE" in the last 168 hours. Recent Labs  Lab 02/15/22 1455  AMMONIA 21   CBC: Recent Labs  Lab 02/15/22 0736 02/16/22 0503 02/17/22 0547 02/18/22 0525  WBC 10.3 12.0* 14.6* 13.6*  NEUTROABS 6.5  --   --   --   HGB 13.7 12.2* 12.3* 12.2*  HCT 42.4 36.7* 36.8* 36.5*  MCV 88.9 87.6 87.2 87.1  PLT 184 160 170 189   Cardiac Enzymes: No results for input(s): "CKTOTAL", "CKMB", "CKMBINDEX", "  TROPONINI" in the last 168 hours. BNP: Invalid input(s): "POCBNP" CBG: Recent Labs  Lab 02/15/22 1152 02/16/22 0822  GLUCAP 167* 188*   D-Dimer No results for input(s): "DDIMER" in the last 72 hours. Hgb A1c No results for input(s): "HGBA1C" in the  last 72 hours. Lipid Profile Recent Labs    02/20/22 0446  CHOL 183  HDL 52  LDLCALC 104*  TRIG 134  CHOLHDL 3.5   Thyroid function studies No results for input(s): "TSH", "T4TOTAL", "T3FREE", "THYROIDAB" in the last 72 hours.  Invalid input(s): "FREET3" Anemia work up No results for input(s): "VITAMINB12", "FOLATE", "FERRITIN", "TIBC", "IRON", "RETICCTPCT" in the last 72 hours. Urinalysis    Component Value Date/Time   COLORURINE YELLOW (A) 02/15/2022 0900   APPEARANCEUR CLEAR (A) 02/15/2022 0900   LABSPEC 1.009 02/15/2022 0900   PHURINE 6.0 02/15/2022 0900   GLUCOSEU NEGATIVE 02/15/2022 0900   HGBUR NEGATIVE 02/15/2022 0900   BILIRUBINUR NEGATIVE 02/15/2022 0900   KETONESUR NEGATIVE 02/15/2022 0900   PROTEINUR 30 (A) 02/15/2022 0900   NITRITE NEGATIVE 02/15/2022 0900   LEUKOCYTESUR NEGATIVE 02/15/2022 0900   Sepsis Labs Recent Labs  Lab 02/15/22 0736 02/16/22 0503 02/17/22 0547 02/18/22 0525  WBC 10.3 12.0* 14.6* 13.6*   Microbiology Recent Results (from the past 240 hour(s))  Resp Panel by RT-PCR (Flu A&B, Covid) Anterior Nasal Swab     Status: None   Collection Time: 02/15/22  7:44 AM   Specimen: Anterior Nasal Swab  Result Value Ref Range Status   SARS Coronavirus 2 by RT PCR NEGATIVE NEGATIVE Final    Comment: (NOTE) SARS-CoV-2 target nucleic acids are NOT DETECTED.  The SARS-CoV-2 RNA is generally detectable in upper respiratory specimens during the acute phase of infection. The lowest concentration of SARS-CoV-2 viral copies this assay can detect is 138 copies/mL. A negative result does not preclude SARS-Cov-2 infection and should not be used as the sole basis for treatment or other patient management decisions. A negative result may occur with  improper specimen collection/handling, submission of specimen other than nasopharyngeal swab, presence of viral mutation(s) within the areas targeted by this assay, and inadequate number of viral copies(<138  copies/mL). A negative result must be combined with clinical observations, patient history, and epidemiological information. The expected result is Negative.  Fact Sheet for Patients:  BloggerCourse.com  Fact Sheet for Healthcare Providers:  SeriousBroker.it  This test is no t yet approved or cleared by the [place] FDA and  has been authorized for detection and/or diagnosis of SARS-CoV-2 by FDA under an Emergency Use Authorization (EUA). This EUA will remain  in effect (meaning this test can be used) for the duration of the COVID-19 declaration under Section 564(b)(1) of the Act, 21 U.S.C.section 360bbb-3(b)(1), unless the authorization is terminated  or revoked sooner.       Influenza A by PCR NEGATIVE NEGATIVE Final   Influenza B by PCR NEGATIVE NEGATIVE Final    Comment: (NOTE) The Xpert Xpress SARS-CoV-2/FLU/RSV plus assay is intended as an aid in the diagnosis of influenza from Nasopharyngeal swab specimens and should not be used as a sole basis for treatment. Nasal washings and aspirates are unacceptable for Xpert Xpress SARS-CoV-2/FLU/RSV testing.  Fact Sheet for Patients: BloggerCourse.com  Fact Sheet for Healthcare Providers: SeriousBroker.it  This test is not yet approved or cleared by the [place] FDA and has been authorized for detection and/or diagnosis of SARS-CoV-2 by FDA under an Emergency Use Authorization (EUA). This EUA will remain in effect (meaning this  test can be used) for the duration of the COVID-19 declaration under Section 564(b)(1) of the Act, 21 U.S.C. section 360bbb-3(b)(1), unless the authorization is terminated or revoked.  Performed at [facility], [address] [place], [place] [address]   SARS Coronavirus 2 by RT PCR (hospital order, performed in [facility] hospital lab) *cepheid single result test* Anterior Nasal  Swab     Status: None   Collection Time: 02/15/22 12:43 PM   Specimen: Anterior Nasal Swab  Result Value Ref Range Status   SARS Coronavirus 2 by RT PCR NEGATIVE NEGATIVE Final    Comment: (NOTE) SARS-CoV-2 target nucleic acids are NOT DETECTED.  The SARS-CoV-2 RNA is generally detectable in upper and lower respiratory specimens during the acute phase of infection. The lowest concentration of SARS-CoV-2 viral copies this assay can detect is 250 copies / mL. A negative result does not preclude SARS-CoV-2 infection and should not be used as the sole basis for treatment or other patient management decisions.  A negative result may occur with improper specimen collection / handling, submission of specimen other than nasopharyngeal swab, presence of viral mutation(s) within the areas targeted by this assay, and inadequate number of viral copies (<250 copies / mL). A negative result must be combined with clinical observations, patient history, and epidemiological information.  Fact Sheet for Patients:   RoadLapTop.co.za  Fact Sheet for Healthcare Providers: http://kim-miller.com/  This test is not yet approved or  cleared by the [place] FDA and has been authorized for detection and/or diagnosis of SARS-CoV-2 by FDA under an Emergency Use Authorization (EUA).  This EUA will remain in effect (meaning this test can be used) for the duration of the COVID-19 declaration under Section 564(b)(1) of the Act, 21 U.S.C. section 360bbb-3(b)(1), unless the authorization is terminated or revoked sooner.  Performed at [facility], [address] [place], [place] [address]      Time coordinating discharge: Over 30 minutes  SIGNED:   Lynn Ito, MD  [facility] 02/20/2022, 11:43 AM Pager   If 7PM-7AM, please contact night-coverage [email or web address] Password [email or web address]

## 2022-02-20 NOTE — TOC Progression Note (Addendum)
Transition of Care [facility]) - Progression Note    Patient Details  Name: Carl [name] MRN: [id number] Date of Birth: 1961/02/25  Transition of Care [facility]) CM/SW Contact  Margarito Liner, LCSW Phone Number: 02/20/2022, 11:17 AM  Clinical Narrative:  [facility] decision is pending. [facility] [place], [place], [facility], [facility] declined referral.   11:33 am: [facility] can accept. Start of care 8/22.  Expected Discharge Plan: Assisted Living (with home health if able to secure agency) Barriers to Discharge: Continued Medical Work up  Expected Discharge Plan and Services Expected Discharge Plan: Assisted Living (with home health if able to secure agency)     Post Acute Care Choice:  (TBD) Living arrangements for the past 2 months: Assisted Living Facility                                       Social Determinants of Health (SDOH) Interventions    Readmission Risk Interventions     No data to display

## 2022-02-20 NOTE — Progress Notes (Signed)
   Heart Failure Nurse Navigator Note  Met with patient today he was awake and alert lying in bed in no acute distress.  Teach back method went over daily weights and what to report.  He did not need any reinforcement.  Discussing fluid restriction he knew that he needed to cut back but could not tell me his limit was.  Discussed 64 ounces/eight 8 ounce cups or 2 L.  Patient was given a scale so he can start to weigh himself upon discharge.  Went over documentation on the weight chart.  Had no further questions.  Pricilla Riffle RN CHFN

## 2022-02-25 ENCOUNTER — Telehealth: Payer: Self-pay | Admitting: Cardiovascular Disease

## 2022-02-25 ENCOUNTER — Encounter: Payer: Self-pay | Admitting: Cardiovascular Disease

## 2022-02-25 NOTE — Telephone Encounter (Signed)
Error

## 2022-02-25 NOTE — Telephone Encounter (Signed)
Carl [name] is calling for and order for nurse visit,  1 week 4, 1 week 7, and 1 PRN visit.

## 2022-02-25 NOTE — Telephone Encounter (Signed)
Will send additional request for PT and nurse visit in the home, to Cadence Lorna Few, who will be seeing pt for follow-up tomorrow 8/22.  She can advise at that time if she would like to provide requested orders, or defer to pts PCP on file.

## 2022-02-25 NOTE — Telephone Encounter (Signed)
Phu a physical therapist with [facility] is also calling requesting verbal orders to give the patient physical therapy for 1 week 9. Please advise.

## 2022-02-25 NOTE — Telephone Encounter (Signed)
Pt c/o swelling: STAT is pt has developed SOB within 24 hours  If swelling, where is the swelling located? No noticeable swelling  How much weight have you gained and in what time span? 5 lbs in 3 days   Have you gained 3 pounds in a day or 5 pounds in a week? 5 lbs in 3 days   Do you have a log of your daily weights (if so, list)?  194 lbs Thursday  199 lbs Sunday 199 lbs today   Are you currently taking a fluid pill? Yes  Are you currently SOB? No   Have you traveled recently? Yes    Was advised to call if patient gained 3 lbs in a day or 5 lbs in a week. Patient is scheduled to come in for a hospital f/u tomorrow, 08/22.

## 2022-02-25 NOTE — Progress Notes (Unsigned)
Cardiology Office Note:    Date:  02/26/2022   ID:  [name], DOB 1960-08-28, MRN [id number]  PCP:  [facility]  [place] Cardiologist:  Kathlyn Sacramento, MD  [facility] Electrophysiologist:  Virl Axe, MD   Referring MD: Orvis Brill, [facility]*   Chief Complaint: Hospital follow-up  History of Present Illness:    Carl [name] is a 61 y.o. male with a hx of CAD s/p CABG in 2017, ICM EF 30-35%, s/p ICD 2017, COPD, paroxysmal Afib on Eliquis, CVA, bipolar disorder, current smoker who presents for hospital follow-up. Previously followed with [facility] cardiology.   He was hospitalized in May 2021 with respiratory distress in the setting of COPD and heart failure. Echocardiogram showed an EF of 25 to 30%..  He underwent a Lexiscan Myoview which showed evidence of prior infarct with no ischemia.  He was hospitalized in July 2021 for sepsis secondary to pneumonia.  He ultimately underwent biventricular ICD upgrade by Dr. Caryl Comes in October 2021. Echocardiogram in February 2022 showed an EF of 30 to 35% with mild to moderate mitral regurgitation.  He was hospitalized in November 2022 with COPD exacerbation.  The patient was recently admitted for SOB with sats on the 80s. He lives at a nursing facility. EMS was called and patient intermittently placed on Bipap. CTA negative for PE. HS trop 68, 102. BNP 888. Started on IV heparin and treated with IV lasix. Suspected elevated troponin from demand ischemia. Echo showed LVEF 25-30%, elevated LVEDP, severely dilated LA, mid MR. He was treated for COPD exacerbation. He was transitioned back to Eliquis.   Today, the patient reports he is overall feeling OK. He reports intermittent chest pressure and some DOE. Symptoms are mainly with activity, but notes some chest pains while sitting. He feels chest pressures is worse than before. Pain is worse with movement and he is tender to palpation. He also reports wheezing. No LLE, orthopnea or  pnd.   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   albuterol (VENTOLIN HFA) 108 (90 Base) MCG/ACT inhaler Inhale 2 puffs into the lungs every 6 (six) hours as needed for wheezing.   apixaban (ELIQUIS) 5 MG TABS tablet TAKE 1 TABLET BY MOUTH TWICE A DAY   atorvastatin (LIPITOR) 40 MG tablet Take 1 tablet (40 mg total) by mouth daily.   carvedilol (COREG) 3.125 MG tablet Take 3.125 mg by mouth 2 (two) times daily with a meal.   divalproex (DEPAKOTE ER) 500 MG  24 hr tablet Take 1 tablet (500 mg total) by mouth 2 (two) times daily.   escitalopram (LEXAPRO) 10 MG tablet Take 1 tablet (10 mg total) by mouth at bedtime.   feeding supplement (ENSURE ENLIVE / ENSURE PLUS) LIQD Take 237 mLs by mouth 2 (two) times daily between meals.   Fluticasone-Salmeterol (ADVAIR) 250-50 MCG/DOSE AEPB Inhale 1 puff into the lungs 2 (two) times daily.   furosemide (LASIX) 40 MG tablet Take 1 tablet (40 mg total) by mouth daily.   Hydrocortisone Acetate 1 % CREA SMARTSIG:sparingly Topical 3 Times Daily   hydrOXYzine (ATARAX) 10 MG  tablet Take 10 mg by mouth 2 (two) times daily.   levETIRAcetam (KEPPRA) 250 MG tablet Take 1 tablet (250 mg total) by mouth 2 (two) times daily.   levothyroxine (SYNTHROID) 25 MCG tablet Take 1 tablet (25 mcg total) by mouth daily.   lisinopril (ZESTRIL) 2.5 MG tablet TAKE 1 TABLET BY MOUTH ONCE DAILY   omeprazole (PRILOSEC) 20 MG capsule Take 20 mg by mouth daily.   QUEtiapine (SEROQUEL) 200 MG tablet Take 200 mg by mouth at bedtime.   tiotropium (SPIRIVA) 18 MCG inhalation capsule Place 18 mcg into inhaler and inhale daily.   traMADol (ULTRAM) 50 MG tablet Take 50 mg by mouth 2 (two) times daily as needed.     Allergies:   Iodinated contrast media, Penicillins, Strawberry extract, Cefepime, Erythromycin, and Sulfa antibiotics   Social History   Socioeconomic History   Marital status: Widowed    Spouse name: Not on file   Number of children: Not on file   Years of education: Not on file   Highest education level: Not on file  Occupational History   Not on file  Tobacco Use   Smoking status: Every Day    Packs/day: 0.25    Years: 0.00    Total pack years: 0.00    Types: Cigarettes   Smokeless tobacco: Never  Vaping Use   Vaping Use: Never used  Substance and Sexual Activity   Alcohol use: Not Currently   Drug use: Never   Sexual activity: Not Currently    Birth control/protection: Abstinence  Other Topics Concern   Not on file  Social History Narrative   Not on file   Social Determinants of Health   Financial Resource Strain: Not on file  Food Insecurity: Not on file  Transportation Needs: Not on file  Physical Activity: Not on file  Stress: Not on file  Social Connections: Not on file     Family History: The patient's family history includes Cancer in his mother; Prostate cancer in his father.  ROS:   Please see the history of present illness.     All other systems reviewed and are negative.  EKGs/Labs/Other Studies Reviewed:    The following studies were  reviewed today:  Echo 02/2022  1. Left ventricular ejection fraction, by estimation, is 25 to 30%. The  left ventricle has severely decreased function. The left ventricle  demonstrates regional wall motion abnormalities (see scoring  diagram/findings for description). The left  ventricular internal cavity size was moderately dilated. There is mild  concentric left ventricular hypertrophy. Left ventricular diastolic  parameters are indeterminate. Elevated left ventricular end-diastolic  pressure.   2. Right ventricular systolic function is normal. The right ventricular  size is normal.   3. Left atrial size was severely dilated.   4. Right atrial size was mildly dilated.   5. The mitral valve is normal in structure. Mild  mitral valve  regurgitation. No evidence of mitral stenosis.   6. The aortic valve is tricuspid. Aortic valve regurgitation is mild. No  aortic stenosis is present.   7. The inferior vena cava is normal in size with <50% respiratory  variability, suggesting right atrial pressure of 8 mmHg.   Echo 08/2020  1. Left ventricular ejection fraction, by estimation, is 30 to 35%. The  left ventricle has moderate to severely decreased function. The left  ventricle demonstrates global hypokinesis. The left ventricular internal  cavity size was moderately dilated.  Left ventricular diastolic parameters are consistent with Grade II  diastolic dysfunction (pseudonormalization).   2. Right ventricular systolic function is normal. The right ventricular  size is normal.   3. Left atrial size was severely dilated.   4. The mitral valve is degenerative. Mild to moderate mitral valve  regurgitation. Mild mitral stenosis. The mean mitral valve gradient is 4.0  mmHg.   5. The aortic valve is tricuspid. Aortic valve regurgitation is not  visualized.   6. The inferior vena cava is dilated in size with >50% respiratory  variability, suggesting right atrial pressure of 8 mmHg.    Comparison(s): EF 25-30%, global hypokinesis, mild-moderate dilated LA and  RA, moderate MAC posterior leaflet.   Myoview Lexsican 12/2019 Narrative & Impression  There was no ST segment deviation noted during stress. No T wave inversion was noted during stress. Defect 1: There is a large defect of severe severity present in the mid anterior, mid inferior, mid inferolateral, apical anterior, apical inferior and apex location. Findings consistent with prior myocardial infarction in the mid to distal LAD and RCA/left circumflex. No significant reversibility. This is a high risk study. Nuclear stress EF: 23%.    EKG:  EKG is  ordered today.  The ekg ordered today demonstrates AV paced rhythm 74bpm, PVCs, no changes  Recent Labs: 02/15/2022: ALT 32; B Natriuretic Peptide 888.4 02/18/2022: Hemoglobin 12.2; Platelets 189 02/20/2022: BUN 51; Creatinine, Ser 1.10; Magnesium 2.4; Potassium 4.6; Sodium 134  Recent Lipid Panel    Component Value Date/Time   CHOL 183 02/20/2022 0446   TRIG 134 02/20/2022 0446   HDL 52 02/20/2022 0446   CHOLHDL 3.5 02/20/2022 0446   VLDL 27 02/20/2022 0446   LDLCALC 104 (H) 02/20/2022 0446    Physical Exam:    VS:  BP (!) 112/54 (BP Location: Left Arm, Patient Position: Sitting, Cuff Size: Normal)   Pulse 74   Ht _0  (1.803 m)   Wt 193 lb (87.5 kg)   SpO2 96%   BMI 26.92 kg/m     Wt Readings from Last 3 Encounters:  02/26/22 193 lb (87.5 kg)  02/15/22 191 lb 12.8 oz (87 kg)  09/20/21 185 lb 4 oz (84 kg)     GEN:  Well nourished, well developed in no acute distress HEENT: Normal NECK: No JVD; No carotid bruits LYMPHATICS: No lymphadenopathy CARDIAC: RRR, no murmurs, rubs, gallops; TTP RESPIRATORY:  Clear to auscultation without rales, wheezing or rhonchi  ABDOMEN: Soft, non-tender, non-distended MUSCULOSKELETAL:  No edema; No deformity  SKIN: Warm and dry NEUROLOGIC:  Alert and oriented x 3 PSYCHIATRIC:  Normal affect   ASSESSMENT:     1. Chronic systolic heart failure ([place])   2. Coronary artery disease involving native coronary artery of native heart without angina pectoris   3. PAF (paroxysmal atrial fibrillation) ([place])   4. Left bundle branch block   5. Chest pain, unspecified type   6. Hyperlipidemia, mixed  7. ICD (implantable cardioverter-defibrillator) in place    PLAN:    In order of problems listed above:  HFrEF Chronic systolic heart failure S/p ICD s/p BIV ICD upgrade in 10/21 Recent admission for acute hypoxic respiratory failure, COPD, and heart failure with BNP up to 800s. Echo showed LVEF 25-30%, which is unchanged from prior. The patient is euvolemic on exam today. Continue lasix 49m daily. BP did not tolerate Entresto in the past. Continue Coreg 3.1237mBID and Lisinopril 2.5 mg daily.   HTN BP is good today. Continue Coreg and Lisinopril.   Elevated troponin Chest pain CAD s/p CABG Elevated troponin in the hospital to 102, suspected demand ischemia in the setting of acute heart failure. He reports chest pain with typical and atypical features as well as DOE. Also he is TTP on exam. EKG with no ischemic changes. I will order a Myoview Lexiscan. No ASA with Eliquis. Continue Coreg, Lipitor and Lisinopril. With low BP in the past, I am reluctant to make medications changes for this reason.    Paroxysmal Afib EKG shows AV paced rhythm with PVCs. Continue Eliquis 22m17mID. Continue Coreg 3.1222m43mD for rate control.   HLD LDL 104, goal<70. Continue Lipitor 40mg61mly. We can increase at follow-up.   Disposition: Follow up in 1 month(s) with MD/APP    Signed, Mrytle Bento H FurNinfa MeekerC  02/26/2022 10:11 AM    [facility]

## 2022-02-25 NOTE — Telephone Encounter (Signed)
Pt will be seeing Cadence Lorna Few for follow-up tomorrow 8/22, requested by the pts PCP.   Will route this message to Cadence Lorna Few for her review and advisement if appropriate, at tomorrow's office visit with the pt.

## 2022-02-25 NOTE — Telephone Encounter (Signed)
Updated callback number for physical therapist [facility] with [facility]

## 2022-02-26 ENCOUNTER — Encounter: Payer: Self-pay | Admitting: Medical

## 2022-02-26 ENCOUNTER — Ambulatory Visit (INDEPENDENT_AMBULATORY_CARE_PROVIDER_SITE_OTHER): Payer: Medicaid Other | Admitting: Medical

## 2022-02-26 VITALS — BP 112/54 | HR 74 | Ht 71.0 in | Wt 193.0 lb

## 2022-02-26 DIAGNOSIS — I5022 Chronic systolic (congestive) heart failure: Secondary | ICD-10-CM | POA: Diagnosis not present

## 2022-02-26 DIAGNOSIS — I447 Left bundle-branch block, unspecified: Secondary | ICD-10-CM

## 2022-02-26 DIAGNOSIS — I480 Paroxysmal atrial fibrillation: Secondary | ICD-10-CM | POA: Diagnosis not present

## 2022-02-26 DIAGNOSIS — I2510 Atherosclerotic heart disease of native coronary artery without angina pectoris: Secondary | ICD-10-CM

## 2022-02-26 DIAGNOSIS — R079 Chest pain, unspecified: Secondary | ICD-10-CM

## 2022-02-26 DIAGNOSIS — Z95810 Presence of automatic (implantable) cardiac defibrillator: Secondary | ICD-10-CM

## 2022-02-26 DIAGNOSIS — E782 Mixed hyperlipidemia: Secondary | ICD-10-CM

## 2022-02-26 NOTE — Telephone Encounter (Signed)
Patient seen today by Terrilee Croak, PA

## 2022-02-26 NOTE — Telephone Encounter (Signed)
Returned the call to [facility] and spoke with [place]. Graylon Good that they will need to contact the pt pcp to rqst orders for home health and PT services. Phu verbalized understanding an voiced appreciation for the call back.

## 2022-02-26 NOTE — Patient Instructions (Addendum)
Medication Instructions:  NO CHANGES *If you need a refill on your cardiac medications before your next appointment, please call your pharmacy*   Lab Work: NONE If you have labs (blood work) drawn today and your tests are completely normal, you will receive your results only by: MyChart Message (if you have MyChart) OR A paper copy in the mail If you have any lab test that is abnormal or we need to change your treatment, we will call you to review the results.   Testing/Procedures: [facility] MYOVIEW  Your caregiver has ordered a Stress Test with nuclear imaging. The purpose of this test is to evaluate the blood supply to your heart muscle. This procedure is referred to as a "Non-Invasive Stress Test." This is because other than having an IV started in your vein, nothing is inserted or "invades" your body. Cardiac stress tests are done to find areas of poor blood flow to the heart by determining the extent of coronary artery disease (CAD). Some patients exercise on a treadmill, which naturally increases the blood flow to your heart, while others who are  unable to walk on a treadmill due to physical limitations have a pharmacologic/chemical stress agent called Lexiscan . This medicine will mimic walking on a treadmill by temporarily increasing your coronary blood flow.   Please note: these test may take anywhere between 2-4 hours to complete  PLEASE REPORT TO [facility] MEDICAL MALL ENTRANCE  THE VOLUNTEERS AT THE FIRST DESK WILL DIRECT YOU WHERE TO GO  Date of Procedure:_____________________________________  Arrival Time for Procedure:______________________________  Instructions regarding medication:   ____ : Hold diabetes medication morning of procedure  ____:  Hold betablocker(s) night before procedure and morning of procedure (only for exercise or dobutamine studies )   ____:  Hold other medications as  follows:_________________________________________________________________________________________________________________________________________________________________________________________________________________________________________________________________________________________  PLEASE NOTIFY THE OFFICE AT LEAST 24 HOURS IN ADVANCE IF YOU ARE UNABLE TO KEEP YOUR APPOINTMENT.  [phone] AND  PLEASE NOTIFY NUCLEAR MEDICINE AT [facility] AT LEAST 24 HOURS IN ADVANCE IF YOU ARE UNABLE TO KEEP YOUR APPOINTMENT. [phone]  How to prepare for your Myoview test:  Do not eat or drink after midnight No caffeine for 24 hours prior to test No smoking 24 hours prior to test. Your medication may be taken with water.  If your doctor stopped a medication because of this test, do not take that medication. Ladies, please do not wear dresses.  Skirts or pants are appropriate. Please wear a short sleeve shirt. No perfume, cologne or lotion. Wear comfortable walking shoes. No heels!    Follow-Up: At [facility], you and your health needs are our priority.  As part of our continuing mission to provide you with exceptional heart care, we have created designated Provider Care Teams.  These Care Teams include your primary Cardiologist (physician) and Advanced Practice Providers (APPs -  Physician Assistants and Nurse Practitioners) who all work together to provide you with the care you need, when you need it.  We recommend signing up for the patient portal called "MyChart".  Sign up information is provided on this After Visit Summary.  MyChart is used to connect with patients for Virtual Visits (Telemedicine).  Patients are able to view lab/test results, encounter notes, upcoming appointments, etc.  Non-urgent messages can be sent to your provider as well.   To learn more about what you can do with MyChart, go to [email or web address].    Your next appointment:   1 month(s)  The format for your  next appointment:  In Person  Provider:   You will see one of the following Advanced Practice Providers on your designated Care Team:     Cadence Fransico Michael, [place]      Other Instructions NONE  Important Information About Sugar

## 2022-02-26 NOTE — Addendum Note (Signed)
Addended by: Scherrie Bateman E on: 02/26/2022 03:04 PM   Modules accepted: Orders

## 2022-03-05 ENCOUNTER — Other Ambulatory Visit: Payer: Self-pay

## 2022-03-05 ENCOUNTER — Emergency Department
Admission: EM | Admit: 2022-03-05 | Discharge: 2022-03-06 | Disposition: A | Discharge status: Other Acute Inpatient Hospital | Payer: No Typology Code available for payment source | Attending: Emergency Medicine | Admitting: Emergency Medicine

## 2022-03-05 ENCOUNTER — Encounter: Payer: Self-pay | Admitting: *Deleted

## 2022-03-05 DIAGNOSIS — R45851 Suicidal ideations: Secondary | ICD-10-CM | POA: Insufficient documentation

## 2022-03-05 DIAGNOSIS — F419 Anxiety disorder, unspecified: Secondary | ICD-10-CM | POA: Insufficient documentation

## 2022-03-05 DIAGNOSIS — N189 Chronic kidney disease, unspecified: Secondary | ICD-10-CM | POA: Insufficient documentation

## 2022-03-05 DIAGNOSIS — I714 Abdominal aortic aneurysm, without rupture, unspecified: Secondary | ICD-10-CM | POA: Insufficient documentation

## 2022-03-05 DIAGNOSIS — Z720 Tobacco use: Secondary | ICD-10-CM | POA: Diagnosis not present

## 2022-03-05 DIAGNOSIS — R45850 Homicidal ideations: Secondary | ICD-10-CM | POA: Diagnosis not present

## 2022-03-05 DIAGNOSIS — F319 Bipolar disorder, unspecified: Secondary | ICD-10-CM | POA: Insufficient documentation

## 2022-03-05 DIAGNOSIS — E039 Hypothyroidism, unspecified: Secondary | ICD-10-CM | POA: Insufficient documentation

## 2022-03-05 DIAGNOSIS — Z20822 Contact with and (suspected) exposure to covid-19: Secondary | ICD-10-CM | POA: Insufficient documentation

## 2022-03-05 DIAGNOSIS — I2510 Atherosclerotic heart disease of native coronary artery without angina pectoris: Secondary | ICD-10-CM | POA: Diagnosis not present

## 2022-03-05 DIAGNOSIS — Z8659 Personal history of other mental and behavioral disorders: Secondary | ICD-10-CM | POA: Diagnosis not present

## 2022-03-05 DIAGNOSIS — I5023 Acute on chronic systolic (congestive) heart failure: Secondary | ICD-10-CM | POA: Diagnosis not present

## 2022-03-05 DIAGNOSIS — I130 Hypertensive heart and chronic kidney disease with heart failure and stage 1 through stage 4 chronic kidney disease, or unspecified chronic kidney disease: Secondary | ICD-10-CM | POA: Insufficient documentation

## 2022-03-05 DIAGNOSIS — F1011 Alcohol abuse, in remission: Secondary | ICD-10-CM | POA: Diagnosis present

## 2022-03-05 DIAGNOSIS — Z9181 History of falling: Secondary | ICD-10-CM

## 2022-03-05 DIAGNOSIS — Z79899 Other long term (current) drug therapy: Secondary | ICD-10-CM

## 2022-03-05 LAB — COMPREHENSIVE METABOLIC PANEL
ALT: 25 U/L (ref 0–44)
AST: 25 U/L (ref 15–41)
Albumin: 3.7 g/dL (ref 3.5–5.0)
Alkaline Phosphatase: 70 U/L (ref 38–126)
Anion gap: 12 (ref 5–15)
BUN: 29 mg/dL — ABNORMAL HIGH (ref 8–23)
CO2: 26 mmol/L (ref 22–32)
Calcium: 9.1 mg/dL (ref 8.9–10.3)
Chloride: 98 mmol/L (ref 98–111)
Creatinine, Ser: 1.75 mg/dL — ABNORMAL HIGH (ref 0.61–1.24)
GFR, Estimated: 43 mL/min — ABNORMAL LOW (ref >60)
Glucose, Bld: 106 mg/dL — ABNORMAL HIGH (ref 70–99)
Potassium: 4.2 mmol/L (ref 3.5–5.1)
Sodium: 136 mmol/L (ref 135–145)
Total Bilirubin: 1.1 mg/dL (ref 0.3–1.2)
Total Protein: 7.3 g/dL (ref 6.5–8.1)

## 2022-03-05 LAB — CBC
HCT: 42.8 % (ref 39.0–52.0)
Hemoglobin: 14.2 g/dL (ref 13.0–17.0)
MCH: 29.3 pg (ref 26.0–34.0)
MCHC: 33.2 g/dL (ref 30.0–36.0)
MCV: 88.4 fL (ref 80.0–100.0)
Platelets: 172 10*3/uL (ref 150–400)
RBC: 4.84 MIL/uL (ref 4.22–5.81)
RDW: 17.7 % — ABNORMAL HIGH (ref 11.5–15.5)
WBC: 13.5 10*3/uL — ABNORMAL HIGH (ref 4.0–10.5)
nRBC: 0 % (ref 0.0–0.2)

## 2022-03-05 LAB — ETHANOL: Alcohol, Ethyl (B): <10 mg/dL (ref <10)

## 2022-03-05 LAB — URINALYSIS, COMPLETE (UACMP) WITH MICROSCOPIC
Bacteria, UA: NONE SEEN
Bilirubin Urine: NEGATIVE
Glucose, UA: NEGATIVE mg/dL
Ketones, ur: NEGATIVE mg/dL
Leukocytes,Ua: NEGATIVE
Nitrite: NEGATIVE
Protein, ur: NEGATIVE mg/dL
Specific Gravity, Urine: 1.015 (ref 1.005–1.030)
pH: 6 (ref 5.0–8.0)

## 2022-03-05 LAB — URINE DRUG SCREEN, QUALITATIVE (ARMC ONLY)
Amphetamines, Ur Screen: NOT DETECTED
Barbiturates, Ur Screen: NOT DETECTED
Benzodiazepine, Ur Scrn: NOT DETECTED
Cannabinoid 50 Ng, Ur ~~LOC~~: NOT DETECTED
Cocaine Metabolite,Ur ~~LOC~~: NOT DETECTED
MDMA (Ecstasy)Ur Screen: NOT DETECTED
Methadone Scn, Ur: NOT DETECTED
Opiate, Ur Screen: NOT DETECTED
Phencyclidine (PCP) Ur S: NOT DETECTED
Tricyclic, Ur Screen: NOT DETECTED

## 2022-03-05 LAB — SALICYLATE LEVEL: Salicylate Lvl: <7 mg/dL — ABNORMAL LOW (ref 7.0–30.0)

## 2022-03-05 LAB — ACETAMINOPHEN LEVEL: Acetaminophen (Tylenol), Serum: <10 ug/mL — ABNORMAL LOW (ref 10–30)

## 2022-03-05 NOTE — BH Assessment (Signed)
Referral information for Psychiatric Hospitalization faxed to;   Carl [name] ([phone]- [phone]),   Carl [name] [phone]) [phone]  Carl [name] [phone]),  Carl [address] [phone]),   Carl [name] [phone]- [phone]),   Carl [name] [phone])  Carl [name] [phone] or [phone]),   Carl [name] [phone])

## 2022-03-05 NOTE — ED Triage Notes (Addendum)
Pt brought in by [facility] from [facility].  Pt reports SI and HI.  Pt is IVC.  Per papers, pt threatened to cut self and family with a knife.    Pt denies drugs or etoh use.  Pt calm and cooperative in triage.  Pt alert.

## 2022-03-05 NOTE — ED Provider Notes (Signed)
[facility] Provider Note    Event Date/Time   First MD Initiated Contact with Patient 03/05/22 1808     (approximate)   History   Psychiatric Evaluation   HPI  Carl [name] is a 60 y.o. male with a history of CAD, biventricular ICD, atrial fibrillation on Eliquis, hyperlipidemia, hypothyroidism, hypertension, chronic kidney disease, CHF, and bipolar disorder who presents from his facility with suicidal and homicidal ideation.  The patient is under involuntary commitment.  He threatened to cut himself and family members with a knife.  The patient does endorse suicidal ideation.  He states he is feeling calm now but when he feels "pressure on me" he starts to have suicidal thoughts.  He denies any acute medical complaints.      Physical Exam   Triage Vital Signs: ED Triage Vitals  Enc Vitals Group     BP 03/05/22 1708 (!) 166/133     Pulse Rate 03/05/22 1708 73     Resp 03/05/22 1708 18     Temp 03/05/22 1708 98.5 F (36.9 C)     Temp Source 03/05/22 1708 Oral     SpO2 03/05/22 1708 96 %     Weight 03/05/22 1705 191 lb 12.8 oz (87 kg)     Height 03/05/22 1705 5\' 11"  (1.803 m)     Head Circumference --      Peak Flow --      Pain Score 03/05/22 1704 0     Pain Loc --      Pain Edu? --      Excl. in GC? --     Most recent vital signs: Vitals:   03/05/22 1708 03/05/22 2014  BP: (!) 166/133 (!) 127/46  Pulse: 73 69  Resp: 18 20  Temp: 98.5 F (36.9 C) 98.4 F (36.9 C)  SpO2: 96% 98%     General: Awake, no distress.  CV:  Good peripheral perfusion.  Resp:  Normal effort.  Abd:  No distention.  Other:  Calm and cooperative.   ED Results / Procedures / Treatments   Labs (all labs ordered are listed, but only abnormal results are displayed) Labs Reviewed  COMPREHENSIVE METABOLIC PANEL - Abnormal; Notable for the following components:      Result Value   Glucose, Bld 106 (*)    BUN 29 (*)    Creatinine, Ser 1.75 (*)    GFR,  Estimated 43 (*)    All other components within normal limits  SALICYLATE LEVEL - Abnormal; Notable for the following components:   Salicylate Lvl <7.0 (*)    All other components within normal limits  ACETAMINOPHEN LEVEL - Abnormal; Notable for the following components:   Acetaminophen (Tylenol), Serum <10 (*)    All other components within normal limits  CBC - Abnormal; Notable for the following components:   WBC 13.5 (*)    RDW 17.7 (*)    All other components within normal limits  URINALYSIS, COMPLETE (UACMP) WITH MICROSCOPIC - Abnormal; Notable for the following components:   Color, Urine YELLOW (*)    APPearance CLEAR (*)    Hgb urine dipstick SMALL (*)    All other components within normal limits  SARS CORONAVIRUS 2 BY RT PCR  ETHANOL  URINE DRUG SCREEN, QUALITATIVE ([facility] ONLY)     EKG     RADIOLOGY   PROCEDURES:  Critical Care performed: No  Procedures   MEDICATIONS ORDERED IN ED: Medications - No data to display  IMPRESSION / MDM / ASSESSMENT AND PLAN / ED COURSE  I reviewed the triage vital signs and the nursing notes.  61 year old male with PMH as noted above presents with suicidal and homicidal ideation.  He denies any acute medical complaints.  I reviewed the past medical records.  The patient was just admitted earlier this month.  Per the hospitalist discharge summary from 02/11/2015, he presented with shortness of breath and was found to be significantly hypoxic.  He was diagnosed with likely acute on chronic CHF and decompensated COPD.  On exam currently the patient is comfortable appearing.  O2 saturation is in the mid 90s on room air and he has no respiratory distress or other acute symptoms.  Differential diagnosis includes, but is not limited to, bipolar disorder, acute psychosis, medication side effects, dementia.  Patient's presentation is most consistent with acute presentation with potential threat to life or bodily function.  We will  obtain lab work-up for medical clearance, psychiatry and TTS consults, and reassess.  The patient has been placed in psychiatric observation due to the need to provide a safe environment for the patient while obtaining psychiatric consultation and evaluation, as well as ongoing medical and medication management to treat the patient's condition.  The patient has been placed under full IVC at this time.   ----------------------------------------- 11:06 PM on 03/05/2022 -----------------------------------------  Lab work-up is unremarkable.  The patient has mild leukocytosis but no clinical evidence of infection.  Electrolytes are unremarkable.  I discussed the case with NP Janee Morn from psychiatry.  The recommendation is inpatient psychiatric admission.   FINAL CLINICAL IMPRESSION(S) / ED DIAGNOSES   Final diagnoses:  Suicidal ideation     Rx / DC Orders   ED Discharge Orders     None        Note:  This document was prepared using Dragon voice recognition software and may include unintentional dictation errors.    Dionne Bucy, MD 03/05/22 [phone]

## 2022-03-05 NOTE — BH Assessment (Signed)
Comprehensive Clinical Assessment (CCA) Note  03/05/2022 Carl [name] [id number]  Chief Complaint: Patient is a 61 year old male presenting to [facility] ED under IVC. Per triage note Pt brought in by [facility].  Pt reports SI and HI. Pt is IVC.  Per papers, pt threatened to cut self and family with a knife. Pt denies drugs or etoh use.  Pt calm and cooperative in triage. Pt alert. During assessment patient appears alert and oriented x4, cooperative and somewhat irritable. Patient reports "I had an issue today, the past 2-3 days I have felt like breaking my hands and I was trying to beat the hell out my nurse." "On Saturday there was too much hatred with the nurses, my medication is too low I'm trying to get some help." Patient continues to report SI/HI, does not appear to be responding to any internal or external stimuli.  Per Psyc NP Elenore Paddy patient is recommended for Inpatient Chief Complaint  Patient presents with   Psychiatric Evaluation   Visit Diagnosis: Bipolar 1 Disorder    CCA Screening, Triage and Referral (STR)  Patient Reported Information How did you hear about [place]? Legal System  Referral name: No data recorded Referral phone number: No data recorded  Whom do you see for routine medical problems? No data recorded Practice/Facility Name: No data recorded Practice/Facility Phone Number: No data recorded Name of Contact: No data recorded Contact Number: No data recorded Contact Fax Number: No data recorded Prescriber Name: No data recorded Prescriber Address (if known): No data recorded  What Is the Reason for Your Visit/Call Today? Pt brought in by [facility].  Pt reports SI and HI.  Pt is IVC.  Per papers, pt threatened to cut self and family with a knife.    Pt denies drugs or etoh use.  Pt calm and cooperative in triage.  Pt alert.  How Long Has This Been Causing You Problems? > than 6 months  What Do You Feel Would Help You the Most Today? No  data recorded  Have You Recently Been in Any Inpatient Treatment (Hospital/Detox/Crisis Center/28-Day Program)? No data recorded Name/Location of Program/Hospital:No data recorded How Long Were You There? No data recorded When Were You Discharged? No data recorded  Have You Ever Received Services From [facility] Before? No data recorded Who Do You See at [facility]? No data recorded  Have You Recently Had Any Thoughts About Hurting Yourself? Yes  Are You Planning to Commit Suicide/Harm Yourself At This time? No   Have you Recently Had Thoughts About Hurting Someone Karolee Ohs? Yes  Explanation: No data recorded  Have You Used Any Alcohol or Drugs in the Past 24 Hours? No  How Long Ago Did You Use Drugs or Alcohol? No data recorded What Did You Use and How Much? No data recorded  Do You Currently Have a Therapist/Psychiatrist? -- (Unknown)  Name of Therapist/Psychiatrist: No data recorded  Have You Been Recently Discharged From Any Office Practice or Programs? No  Explanation of Discharge From Practice/Program: No data recorded    CCA Screening Triage Referral Assessment Type of Contact: Face-to-Face  Is this Initial or Reassessment? No data recorded Date Telepsych consult ordered in [facility]:  No data recorded Time Telepsych consult ordered in [facility]:  No data recorded  Patient Reported Information Reviewed? No data recorded Patient Left Without Being Seen? No data recorded Reason for Not Completing Assessment: No data recorded  Collateral Involvement: No data recorded  Does Patient  Have a Stage manager Guardian? No data recorded Name and Contact of Legal Guardian: No data recorded If Minor and Not Living with Parent(s), Who has Custody? No data recorded Is CPS involved or ever been involved? Never  Is APS involved or ever been involved? Never   Patient Determined To Be At Risk for Harm To Self or Others Based on Review of Patient Reported Information or Presenting  Complaint? No  Method: No data recorded Availability of Means: No data recorded Intent: No data recorded Notification Required: No data recorded Additional Information for Danger to Others Potential: No data recorded Additional Comments for Danger to Others Potential: No data recorded Are There Guns or Other Weapons in Your Home? No data recorded Types of Guns/Weapons: No data recorded Are These Weapons Safely Secured?                            No data recorded Who Could Verify You Are Able To Have These Secured: No data recorded Do You Have any Outstanding Charges, Pending Court Dates, Parole/Probation? No data recorded Contacted To Inform of Risk of Harm To Self or Others: No data recorded  Location of Assessment: [facility] ED   Does Patient Present under Involuntary Commitment? Yes  IVC Papers Initial File Date: 03/05/22   [place] of Residence: [place]   Patient Currently Receiving the Following Services: Medication Management   Determination of Need: Emergent (2 hours)   Options For Referral: No data recorded    CCA Biopsychosocial Intake/Chief Complaint:  No data recorded Current Symptoms/Problems: No data recorded  Patient Reported Schizophrenia/Schizoaffective Diagnosis in Past: No   Strengths: Patient is able to communicate  Preferences: No data recorded Abilities: No data recorded  Type of Services Patient Feels are Needed: No data recorded  Initial Clinical Notes/Concerns: No data recorded  Mental Health Symptoms Depression:   None   Duration of Depressive symptoms: No data recorded  Mania:   Irritability; Recklessness   Anxiety:    Irritability; Restlessness   Psychosis:   None   Duration of Psychotic symptoms: No data recorded  Trauma:   None   Obsessions:   Poor insight   Compulsions:   Absent insight/delusional; "Driven" to perform behaviors/acts; Poor Insight; Repeated behaviors/mental acts   Inattention:   None    Hyperactivity/Impulsivity:   None   Oppositional/Defiant Behaviors:   Angry; Argumentative; Defies rules   Emotional Irregularity:   Intense/inappropriate anger   Other Mood/Personality Symptoms:  No data recorded   Mental Status Exam Appearance and self-care  Stature:   Average   Weight:   Overweight   Clothing:   Casual   Grooming:   Normal   Cosmetic use:   None   Posture/gait:   Normal   Motor activity:   Not Remarkable   Sensorium  Attention:   Normal   Concentration:   Normal   Orientation:   X5   Recall/memory:   Normal   Affect and Mood  Affect:   Appropriate   Mood:   Irritable   Relating  Eye contact:   Normal   Facial expression:   Responsive   Attitude toward examiner:   Cooperative   Thought and Language  Speech flow:  Clear and Coherent   Thought content:   Appropriate to Mood and Circumstances   Preoccupation:   None   Hallucinations:   None   Organization:  No data recorded  [employer] of  Knowledge:   Fair   Intelligence:   Average   Abstraction:   Functional   Judgement:   Poor   Reality Testing:   Adequate   Insight:   Fair   Decision Making:   Impulsive   Social Functioning  Social Maturity:   Impulsive   Social Judgement:   Heedless   Stress  Stressors:   Housing   Coping Ability:   Exhausted   Skill Deficits:   None   Supports:   Friends/Service system     Religion: Religion/Spirituality Are You A Religious Person?: No  Leisure/Recreation: Leisure / Recreation Do You Have Hobbies?: No  Exercise/Diet: Exercise/Diet Do You Exercise?: No Have You Gained or Lost A Significant Amount of Weight in the Past Six Months?: No Do You Follow a Special Diet?: No Do You Have Any Trouble Sleeping?: No   CCA Employment/Education Employment/Work Situation: Employment / Work Systems developer: On disability Why is Patient on Disability: Mental  Health How Long has Patient Been on Disability: Unknown Patient's Job has Been Impacted by Current Illness: No Has Patient ever Been in the [employer]?: No  Education: Education Is Patient Currently Attending School?: No Did You Product manager?: No Did You Have An Individualized Education Program (IIEP): No Did You Have Any Difficulty At School?: No Patient's Education Has Been Impacted by Current Illness: No   CCA Family/Childhood History Family and Relationship History: Family history Marital status: Widowed Widowed, when?: Unknown Does patient have children?: No  Childhood History:  Childhood History By whom was/is the patient raised?: Both parents Did patient suffer any verbal/emotional/physical/sexual abuse as a child?: No Did patient suffer from severe childhood neglect?: No Has patient ever been sexually abused/assaulted/raped as an adolescent or adult?: No Was the patient ever a victim of a crime or a disaster?: No Witnessed domestic violence?: No Has patient been affected by domestic violence as an adult?: No  Child/Adolescent Assessment:     CCA Substance Use Alcohol/Drug Use: Alcohol / Drug Use Pain Medications: See MAR Prescriptions: See MAR Over the Counter: See MAR History of alcohol / drug use?: No history of alcohol / drug abuse                         ASAM's:  Six Dimensions of Multidimensional Assessment  Dimension 1:  Acute Intoxication and/or Withdrawal Potential:      Dimension 2:  Biomedical Conditions and Complications:      Dimension 3:  Emotional, Behavioral, or Cognitive Conditions and Complications:     Dimension 4:  Readiness to Change:     Dimension 5:  Relapse, Continued use, or Continued Problem Potential:     Dimension 6:  Recovery/Living Environment:     ASAM Severity Score:    ASAM Recommended Level of Treatment:     Substance use Disorder (SUD)    Recommendations for Services/Supports/Treatments:    DSM5  Diagnoses: Patient Active Problem List   Diagnosis Date Noted   Dyspnea    Acute exacerbation of CHF (congestive heart failure) (HCC) 02/15/2022   Anxiety 02/15/2022   Tobacco use 02/15/2022   Elevated partial thromboplastin time (PTT) 02/15/2022   Abdominal pain 02/15/2022   HFrEF (heart failure with reduced ejection fraction) (HCC)    Weakness    COPD with acute exacerbation (HCC) 05/23/2021   PAF (paroxysmal atrial fibrillation) (HCC) 05/23/2021   Chronic anticoagulation 05/23/2021   Abdominal aortic aneurysm (AAA) 35 to 39 mm in diameter (HCC) 05/23/2021  Ischemic cardiomyopathy [id number]   Chronic systolic heart failure ([place]) 05/22/2020   HCAP (healthcare-associated pneumonia) 01/24/2020   Hyperkalemia 01/24/2020   Acute on chronic combined systolic (congestive) and diastolic (congestive) heart failure ([place]) 01/24/2020   Seizure ([place]) 01/24/2020   HLD (hyperlipidemia) 01/24/2020   Ascites due to alcoholic cirrhosis (HCC)    Acute on chronic systolic CHF (congestive heart failure) ([place]) 11/18/2019   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/18/2019   Elevated troponin 11/18/2019   Hypoxia 11/18/2019   Acute renal failure superimposed on stage 3a chronic kidney disease ([place]) 11/18/2019   Coronary artery disease 05/15/2019   Essential hypertension 05/15/2019   History of stroke 05/15/2019   Homicidal ideation 05/15/2019   Hypothyroidism 05/15/2019   Constipation 05/06/2019   Scrotal mass 05/06/2019   Bipolar 1 disorder ([place]) 12/23/2018   Acute on chronic respiratory failure with hypoxia ([place]) 11/03/2018   Chronic pain of left knee 09/17/2018   Atypical chest pain 10/11/2017   Polypharmacy 08/05/2017   Cerebrovascular accident (CVA) due to stenosis of left middle cerebral artery ([place]) 06/26/2017   Urinary retention due to benign prostatic hyperplasia 06/22/2017   Falls 06/20/2017   Risk for falls 02/19/2017   Peripheral artery disease ([place]) 08/28/2016    Claudication ([place]) 08/09/2016   History of alcohol abuse 08/09/2016   History of drug dependence/abuse ([place]) 08/09/2016   Severe single current episode of major depressive disorder, without psychotic features ([place]) 08/09/2016   Mitral insufficiency 01/04/2016   Prediabetes 12/29/2015   Fracture of left clavicle 07/13/2012    Patient Centered Plan: Patient is on the following Treatment Plan(s):  Impulse Control   Referrals to Alternative Service(s): Referred to Alternative Service(s):   Place:   Date:   Time:    Referred to Alternative Service(s):   Place:   Date:   Time:    Referred to Alternative Service(s):   Place:   Date:   Time:    Referred to Alternative Service(s):   Place:   Date:   Time:      @BHCOLLABOFCARE @  [employer], LCAS-A

## 2022-03-05 NOTE — ED Notes (Signed)
PT IVC /PSYCH CONSULT ORDERED/ PENDING  

## 2022-03-05 NOTE — ED Notes (Signed)
Black wallet/chain White Architect Black belt [employer] Gray socks

## 2022-03-05 NOTE — ED Notes (Signed)
Pt given a turkey sandwich tray and a cup of sprite at this time.  

## 2022-03-05 NOTE — ED Notes (Signed)
Report received from [place], English as a second language teacher. Patient alert and oriented, warm and dry, and in no acute distress. Patient denies AVH and pain. Patient made aware of Q15 minute rounds and Psychologist, counselling presence for their safety. Patient instructed to come to this nurse with needs or concerns.

## 2022-03-06 DIAGNOSIS — F319 Bipolar disorder, unspecified: Secondary | ICD-10-CM

## 2022-03-06 LAB — SARS CORONAVIRUS 2 BY RT PCR: SARS Coronavirus 2 by RT PCR: NEGATIVE

## 2022-03-06 LAB — VALPROIC ACID LEVEL: Valproic Acid Lvl: 68 ug/mL (ref 50.0–100.0)

## 2022-03-06 MED ORDER — TIOTROPIUM BROMIDE MONOHYDRATE 18 MCG IN CAPS: 18.0000 ug | ORAL_CAPSULE | Freq: Every day | RESPIRATORY_TRACT | Status: DC

## 2022-03-06 MED ORDER — APIXABAN 5 MG PO TABS
5.0000 mg | ORAL_TABLET | Freq: Two times a day (BID) | ORAL | Status: DC
  Administered 2022-03-06: 5 mg via ORAL
  Filled 2022-03-06: qty 1

## 2022-03-06 MED ORDER — QUETIAPINE FUMARATE 200 MG PO TABS: 200.0000 mg | ORAL_TABLET | Freq: Every day | ORAL | Status: DC

## 2022-03-06 MED ORDER — LISINOPRIL 5 MG PO TABS
2.5000 mg | ORAL_TABLET | Freq: Every day | ORAL | Status: DC
  Administered 2022-03-06: 2.5 mg via ORAL
  Filled 2022-03-06: qty 1

## 2022-03-06 MED ORDER — ALBUTEROL SULFATE (2.5 MG/3ML) 0.083% IN NEBU: 2.5000 mg | INHALATION_SOLUTION | Freq: Four times a day (QID) | RESPIRATORY_TRACT | Status: DC | PRN

## 2022-03-06 MED ORDER — FUROSEMIDE 40 MG PO TABS
40.0000 mg | ORAL_TABLET | Freq: Every day | ORAL | Status: DC
  Administered 2022-03-06: 40 mg via ORAL
  Filled 2022-03-06 (×2): qty 1

## 2022-03-06 MED ORDER — ACETAMINOPHEN 500 MG PO TABS: 500.0000 mg | ORAL_TABLET | Freq: Four times a day (QID) | ORAL | Status: DC | PRN

## 2022-03-06 MED ORDER — DIVALPROEX SODIUM ER 500 MG PO TB24
500.0000 mg | ORAL_TABLET | Freq: Two times a day (BID) | ORAL | Status: DC
  Administered 2022-03-06: 500 mg via ORAL
  Filled 2022-03-06: qty 1

## 2022-03-06 MED ORDER — ATORVASTATIN CALCIUM 20 MG PO TABS
40.0000 mg | ORAL_TABLET | Freq: Every day | ORAL | Status: DC
  Administered 2022-03-06: 40 mg via ORAL
  Filled 2022-03-06 (×2): qty 2

## 2022-03-06 MED ORDER — LEVETIRACETAM 250 MG PO TABS
250.0000 mg | ORAL_TABLET | Freq: Two times a day (BID) | ORAL | Status: DC
  Administered 2022-03-06: 250 mg via ORAL
  Filled 2022-03-06 (×2): qty 1

## 2022-03-06 MED ORDER — HYDROXYZINE HCL 10 MG PO TABS
10.0000 mg | ORAL_TABLET | Freq: Two times a day (BID) | ORAL | Status: DC
  Administered 2022-03-06: 10 mg via ORAL
  Filled 2022-03-06: qty 1

## 2022-03-06 MED ORDER — MOMETASONE FURO-FORMOTEROL FUM 200-5 MCG/ACT IN AERO: 2.0000 | INHALATION_SPRAY | Freq: Two times a day (BID) | RESPIRATORY_TRACT | Status: DC

## 2022-03-06 MED ORDER — PANTOPRAZOLE SODIUM 40 MG PO TBEC
40.0000 mg | DELAYED_RELEASE_TABLET | Freq: Every day | ORAL | Status: DC
  Administered 2022-03-06: 40 mg via ORAL
  Filled 2022-03-06: qty 1

## 2022-03-06 MED ORDER — ESCITALOPRAM OXALATE 10 MG PO TABS: 10.0000 mg | ORAL_TABLET | Freq: Every day | ORAL | Status: DC

## 2022-03-06 MED ORDER — ENSURE ENLIVE PO LIQD: 237.0000 mL | Freq: Two times a day (BID) | ORAL | Status: DC

## 2022-03-06 MED ORDER — CARVEDILOL 3.125 MG PO TABS
3.1250 mg | ORAL_TABLET | Freq: Two times a day (BID) | ORAL | Status: DC
  Administered 2022-03-06: 3.125 mg via ORAL
  Filled 2022-03-06: qty 1

## 2022-03-06 MED ORDER — LEVOTHYROXINE SODIUM 25 MCG PO TABS
25.0000 ug | ORAL_TABLET | Freq: Every day | ORAL | Status: DC
  Administered 2022-03-06: 25 ug via ORAL
  Filled 2022-03-06: qty 1

## 2022-03-06 NOTE — ED Notes (Signed)
Ivc /patient has been accepted to [facility] patient assigned to [facility] building accepting physician is dr reddy call report to [phone] rep was kia / bed available after 8:00 am moved to [id number]

## 2022-03-06 NOTE — BH Assessment (Signed)
PATIENT BED AVAILABLE AFTER 8AM ON 03/06/22  Patient has been accepted to [facility]  Patient assigned to [facility] Building Accepting physician is Dr. Betti Cruz.  Call report to [phone].  Representative was Kia.   ER Staff is aware of it:  Carlene ER Secretary  Dr. Elesa Massed, ER MD  Thayer Ohm Patient's Nurse

## 2022-03-06 NOTE — ED Notes (Signed)
Patient discharged to [facility], He was pleasant and cooperative, all belongings taken with him. Transported via [employer].

## 2022-03-06 NOTE — ED Notes (Signed)
Sprite given to patient at this time

## 2022-03-06 NOTE — ED Notes (Signed)
Called [facility] Com for Sheriff's transport to [employer]  [phone]
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
## 2022-03-06 NOTE — ED Notes (Signed)
Pt. To BHU from ED ambulatory without difficulty, to room  BHU 6. Report from Chris RN. Pt. Is alert and oriented, warm and dry in no distress. Pt. Denies SI, HI, and AVH. Pt. Calm and cooperative. Pt. Made aware of security cameras and Q15 minute rounds. Pt. Encouraged to let Nursing staff know of any concerns or needs.    ENVIRONMENTAL ASSESSMENT Potentially harmful objects out of patient reach: Yes.   Personal belongings secured: Yes.   Patient dressed in hospital provided attire only: Yes.   Plastic bags out of patient reach: Yes.   Patient care equipment (cords, cables, call bells, lines, and drains) shortened, removed, or accounted for: Yes.   Equipment and supplies removed from bottom of stretcher: Yes.   Potentially toxic materials out of patient reach: Yes.   Sharps container removed or out of patient reach: Yes.     

## 2022-03-14 ENCOUNTER — Emergency Department: Payer: Medicaid Other

## 2022-03-14 ENCOUNTER — Other Ambulatory Visit: Payer: Self-pay

## 2022-03-14 ENCOUNTER — Encounter: Payer: Self-pay | Admitting: Emergency Medicine

## 2022-03-14 ENCOUNTER — Telehealth: Payer: Self-pay | Admitting: Family

## 2022-03-14 ENCOUNTER — Inpatient Hospital Stay
Admission: EM | Admit: 2022-03-14 | Discharge: 2022-03-21 | DRG: 190 | Disposition: A | Discharge status: Home Health | Payer: Medicaid Other | Attending: Internal Medicine | Admitting: Internal Medicine

## 2022-03-14 ENCOUNTER — Ambulatory Visit: Payer: Medicaid Other | Admitting: Family

## 2022-03-14 DIAGNOSIS — F319 Bipolar disorder, unspecified: Secondary | ICD-10-CM | POA: Diagnosis present

## 2022-03-14 DIAGNOSIS — I5043 Acute on chronic combined systolic (congestive) and diastolic (congestive) heart failure: Secondary | ICD-10-CM | POA: Diagnosis not present

## 2022-03-14 DIAGNOSIS — Z8673 Personal history of transient ischemic attack (TIA), and cerebral infarction without residual deficits: Secondary | ICD-10-CM | POA: Diagnosis not present

## 2022-03-14 DIAGNOSIS — F17210 Nicotine dependence, cigarettes, uncomplicated: Secondary | ICD-10-CM | POA: Diagnosis present

## 2022-03-14 DIAGNOSIS — I2510 Atherosclerotic heart disease of native coronary artery without angina pectoris: Secondary | ICD-10-CM | POA: Diagnosis present

## 2022-03-14 DIAGNOSIS — Z7901 Long term (current) use of anticoagulants: Secondary | ICD-10-CM

## 2022-03-14 DIAGNOSIS — N182 Chronic kidney disease, stage 2 (mild): Secondary | ICD-10-CM | POA: Diagnosis present

## 2022-03-14 DIAGNOSIS — I480 Paroxysmal atrial fibrillation: Secondary | ICD-10-CM | POA: Diagnosis not present

## 2022-03-14 DIAGNOSIS — I5084 End stage heart failure: Secondary | ICD-10-CM | POA: Diagnosis present

## 2022-03-14 DIAGNOSIS — Z789 Other specified health status: Secondary | ICD-10-CM | POA: Diagnosis not present

## 2022-03-14 DIAGNOSIS — R079 Chest pain, unspecified: Secondary | ICD-10-CM | POA: Diagnosis present

## 2022-03-14 DIAGNOSIS — I5023 Acute on chronic systolic (congestive) heart failure: Secondary | ICD-10-CM | POA: Diagnosis not present

## 2022-03-14 DIAGNOSIS — Z66 Do not resuscitate: Secondary | ICD-10-CM | POA: Diagnosis not present

## 2022-03-14 DIAGNOSIS — E1165 Type 2 diabetes mellitus with hyperglycemia: Secondary | ICD-10-CM | POA: Diagnosis not present

## 2022-03-14 DIAGNOSIS — U071 COVID-19: Secondary | ICD-10-CM | POA: Diagnosis not present

## 2022-03-14 DIAGNOSIS — Z515 Encounter for palliative care: Secondary | ICD-10-CM | POA: Diagnosis not present

## 2022-03-14 DIAGNOSIS — R1084 Generalized abdominal pain: Secondary | ICD-10-CM | POA: Diagnosis present

## 2022-03-14 DIAGNOSIS — Z23 Encounter for immunization: Secondary | ICD-10-CM | POA: Diagnosis not present

## 2022-03-14 DIAGNOSIS — E039 Hypothyroidism, unspecified: Secondary | ICD-10-CM | POA: Diagnosis present

## 2022-03-14 DIAGNOSIS — Z7951 Long term (current) use of inhaled steroids: Secondary | ICD-10-CM

## 2022-03-14 DIAGNOSIS — N170 Acute kidney failure with tubular necrosis: Secondary | ICD-10-CM | POA: Diagnosis not present

## 2022-03-14 DIAGNOSIS — G9341 Metabolic encephalopathy: Secondary | ICD-10-CM | POA: Diagnosis not present

## 2022-03-14 DIAGNOSIS — I252 Old myocardial infarction: Secondary | ICD-10-CM | POA: Diagnosis not present

## 2022-03-14 DIAGNOSIS — Z882 Allergy status to sulfonamides status: Secondary | ICD-10-CM

## 2022-03-14 DIAGNOSIS — Z951 Presence of aortocoronary bypass graft: Secondary | ICD-10-CM | POA: Diagnosis not present

## 2022-03-14 DIAGNOSIS — N1831 Chronic kidney disease, stage 3a: Secondary | ICD-10-CM | POA: Diagnosis not present

## 2022-03-14 DIAGNOSIS — J9622 Acute and chronic respiratory failure with hypercapnia: Secondary | ICD-10-CM | POA: Diagnosis not present

## 2022-03-14 DIAGNOSIS — I130 Hypertensive heart and chronic kidney disease with heart failure and stage 1 through stage 4 chronic kidney disease, or unspecified chronic kidney disease: Secondary | ICD-10-CM | POA: Diagnosis present

## 2022-03-14 DIAGNOSIS — Z8042 Family history of malignant neoplasm of prostate: Secondary | ICD-10-CM

## 2022-03-14 DIAGNOSIS — Z880 Allergy status to penicillin: Secondary | ICD-10-CM

## 2022-03-14 DIAGNOSIS — J9621 Acute and chronic respiratory failure with hypoxia: Secondary | ICD-10-CM | POA: Diagnosis not present

## 2022-03-14 DIAGNOSIS — I4510 Unspecified right bundle-branch block: Secondary | ICD-10-CM | POA: Diagnosis present

## 2022-03-14 DIAGNOSIS — G40909 Epilepsy, unspecified, not intractable, without status epilepticus: Secondary | ICD-10-CM

## 2022-03-14 DIAGNOSIS — Z20822 Contact with and (suspected) exposure to covid-19: Secondary | ICD-10-CM | POA: Diagnosis present

## 2022-03-14 DIAGNOSIS — Z91018 Allergy to other foods: Secondary | ICD-10-CM

## 2022-03-14 DIAGNOSIS — E785 Hyperlipidemia, unspecified: Secondary | ICD-10-CM

## 2022-03-14 DIAGNOSIS — Z7952 Long term (current) use of systemic steroids: Secondary | ICD-10-CM

## 2022-03-14 DIAGNOSIS — J441 Chronic obstructive pulmonary disease with (acute) exacerbation: Principal | ICD-10-CM | POA: Diagnosis present

## 2022-03-14 DIAGNOSIS — J9601 Acute respiratory failure with hypoxia: Secondary | ICD-10-CM | POA: Diagnosis not present

## 2022-03-14 DIAGNOSIS — I959 Hypotension, unspecified: Secondary | ICD-10-CM | POA: Diagnosis present

## 2022-03-14 DIAGNOSIS — Z95810 Presence of automatic (implantable) cardiac defibrillator: Secondary | ICD-10-CM

## 2022-03-14 DIAGNOSIS — Z881 Allergy status to other antibiotic agents status: Secondary | ICD-10-CM

## 2022-03-14 DIAGNOSIS — R778 Other specified abnormalities of plasma proteins: Secondary | ICD-10-CM | POA: Diagnosis not present

## 2022-03-14 DIAGNOSIS — Z91041 Radiographic dye allergy status: Secondary | ICD-10-CM

## 2022-03-14 DIAGNOSIS — I10 Essential (primary) hypertension: Secondary | ICD-10-CM | POA: Diagnosis present

## 2022-03-14 LAB — COMPREHENSIVE METABOLIC PANEL
ALT: 42 U/L (ref 0–44)
AST: 40 U/L (ref 15–41)
Albumin: 3.3 g/dL — ABNORMAL LOW (ref 3.5–5.0)
Alkaline Phosphatase: 82 U/L (ref 38–126)
Anion gap: 10 (ref 5–15)
BUN: 18 mg/dL (ref 8–23)
CO2: 21 mmol/L — ABNORMAL LOW (ref 22–32)
Calcium: 9.6 mg/dL (ref 8.9–10.3)
Chloride: 107 mmol/L (ref 98–111)
Creatinine, Ser: 1.28 mg/dL — ABNORMAL HIGH (ref 0.61–1.24)
GFR, Estimated: >60 mL/min (ref >60)
Glucose, Bld: 118 mg/dL — ABNORMAL HIGH (ref 70–99)
Potassium: 4.4 mmol/L (ref 3.5–5.1)
Sodium: 138 mmol/L (ref 135–145)
Total Bilirubin: 0.6 mg/dL (ref 0.3–1.2)
Total Protein: 6.9 g/dL (ref 6.5–8.1)

## 2022-03-14 LAB — BRAIN NATRIURETIC PEPTIDE: B Natriuretic Peptide: 378.3 pg/mL — ABNORMAL HIGH (ref 0.0–100.0)

## 2022-03-14 LAB — BLOOD GAS, VENOUS
Acid-Base Excess: 1.7 mmol/L (ref 0.0–2.0)
Bicarbonate: 27.7 mmol/L (ref 20.0–28.0)
O2 Saturation: 19.9 %
Patient temperature: 37
pCO2, Ven: 48 mmHg (ref 44–60)
pH, Ven: 7.37 (ref 7.25–7.43)
pO2, Ven: <31 mmHg — CL (ref 32–45)

## 2022-03-14 LAB — URINALYSIS, ROUTINE W REFLEX MICROSCOPIC
Bacteria, UA: NONE SEEN
Bilirubin Urine: NEGATIVE
Glucose, UA: NEGATIVE mg/dL
Hgb urine dipstick: NEGATIVE
Ketones, ur: NEGATIVE mg/dL
Leukocytes,Ua: NEGATIVE
Nitrite: NEGATIVE
Protein, ur: 100 mg/dL — AB
Specific Gravity, Urine: 1.012 (ref 1.005–1.030)
Squamous Epithelial / HPF: NONE SEEN (ref 0–5)
pH: 6 (ref 5.0–8.0)

## 2022-03-14 LAB — CBC
HCT: 37.7 % — ABNORMAL LOW (ref 39.0–52.0)
Hemoglobin: 12.2 g/dL — ABNORMAL LOW (ref 13.0–17.0)
MCH: 28.8 pg (ref 26.0–34.0)
MCHC: 32.4 g/dL (ref 30.0–36.0)
MCV: 89.1 fL (ref 80.0–100.0)
Platelets: 212 10*3/uL (ref 150–400)
RBC: 4.23 MIL/uL (ref 4.22–5.81)
RDW: 17.2 % — ABNORMAL HIGH (ref 11.5–15.5)
WBC: 8.6 10*3/uL (ref 4.0–10.5)
nRBC: 0 % (ref 0.0–0.2)

## 2022-03-14 LAB — AMMONIA: Ammonia: <10 umol/L (ref 9–35)

## 2022-03-14 LAB — LIPASE, BLOOD: Lipase: 40 U/L (ref 11–51)

## 2022-03-14 LAB — TROPONIN I (HIGH SENSITIVITY)
Troponin I (High Sensitivity): 100 ng/L (ref <18)
Troponin I (High Sensitivity): 120 ng/L (ref <18)

## 2022-03-14 LAB — VALPROIC ACID LEVEL: Valproic Acid Lvl: 50 ug/mL (ref 50.0–100.0)

## 2022-03-14 LAB — ETHANOL: Alcohol, Ethyl (B): <10 mg/dL (ref <10)

## 2022-03-14 MED ORDER — PREDNISONE 10 MG PO TABS: 10.0000 mg | ORAL_TABLET | Freq: Every day | ORAL | Status: DC

## 2022-03-14 MED ORDER — IPRATROPIUM-ALBUTEROL 0.5-2.5 (3) MG/3ML IN SOLN
3.0000 mL | Freq: Once | RESPIRATORY_TRACT | Status: AC
  Administered 2022-03-14: 3 mL via RESPIRATORY_TRACT
  Filled 2022-03-14: qty 3

## 2022-03-14 MED ORDER — MAGNESIUM HYDROXIDE 400 MG/5ML PO SUSP
30.0000 mL | Freq: Every day | ORAL | Status: DC | PRN
  Administered 2022-03-19: 30 mL via ORAL
  Filled 2022-03-14: qty 30

## 2022-03-14 MED ORDER — TRAMADOL HCL 50 MG PO TABS
50.0000 mg | ORAL_TABLET | Freq: Two times a day (BID) | ORAL | Status: DC | PRN
  Administered 2022-03-15 – 2022-03-16 (×2): 50 mg via ORAL
  Filled 2022-03-14 (×3): qty 1

## 2022-03-14 MED ORDER — ESCITALOPRAM OXALATE 10 MG PO TABS
10.0000 mg | ORAL_TABLET | Freq: Every day | ORAL | Status: DC
  Administered 2022-03-14 – 2022-03-20 (×7): 10 mg via ORAL
  Filled 2022-03-14 (×7): qty 1

## 2022-03-14 MED ORDER — APIXABAN 5 MG PO TABS
5.0000 mg | ORAL_TABLET | Freq: Two times a day (BID) | ORAL | Status: DC
  Administered 2022-03-14 – 2022-03-18 (×8): 5 mg via ORAL
  Filled 2022-03-14 (×8): qty 1

## 2022-03-14 MED ORDER — ONDANSETRON HCL 4 MG/2ML IJ SOLN: 4.0000 mg | Freq: Four times a day (QID) | INTRAMUSCULAR | Status: DC | PRN

## 2022-03-14 MED ORDER — ONDANSETRON HCL 4 MG PO TABS: 4.0000 mg | ORAL_TABLET | Freq: Four times a day (QID) | ORAL | Status: DC | PRN

## 2022-03-14 MED ORDER — DIVALPROEX SODIUM ER 500 MG PO TB24
500.0000 mg | ORAL_TABLET | Freq: Two times a day (BID) | ORAL | Status: DC
  Administered 2022-03-14 – 2022-03-21 (×14): 500 mg via ORAL
  Filled 2022-03-14 (×3): qty 1
  Filled 2022-03-14: qty 2
  Filled 2022-03-14 (×10): qty 1

## 2022-03-14 MED ORDER — TRAZODONE HCL 50 MG PO TABS
25.0000 mg | ORAL_TABLET | Freq: Every evening | ORAL | Status: DC | PRN
  Administered 2022-03-16: 25 mg via ORAL
  Filled 2022-03-14: qty 1

## 2022-03-14 MED ORDER — SODIUM CHLORIDE 0.9 % IV SOLN: INTRAVENOUS | Status: DC

## 2022-03-14 MED ORDER — METHYLPREDNISOLONE SODIUM SUCC 125 MG IJ SOLR
125.0000 mg | Freq: Once | INTRAMUSCULAR | Status: AC
  Administered 2022-03-14: 125 mg via INTRAMUSCULAR
  Filled 2022-03-14: qty 2

## 2022-03-14 MED ORDER — CARVEDILOL 3.125 MG PO TABS
3.1250 mg | ORAL_TABLET | Freq: Two times a day (BID) | ORAL | Status: DC
  Administered 2022-03-15 – 2022-03-21 (×12): 3.125 mg via ORAL
  Filled 2022-03-14 (×12): qty 1

## 2022-03-14 MED ORDER — PANTOPRAZOLE SODIUM 40 MG PO TBEC
40.0000 mg | DELAYED_RELEASE_TABLET | Freq: Every day | ORAL | Status: DC
  Administered 2022-03-15 – 2022-03-21 (×7): 40 mg via ORAL
  Filled 2022-03-14 (×7): qty 1

## 2022-03-14 MED ORDER — IPRATROPIUM-ALBUTEROL 0.5-2.5 (3) MG/3ML IN SOLN
3.0000 mL | Freq: Four times a day (QID) | RESPIRATORY_TRACT | Status: DC
  Administered 2022-03-15: 3 mL via RESPIRATORY_TRACT
  Filled 2022-03-14: qty 3

## 2022-03-14 MED ORDER — HYDROXYZINE HCL 10 MG PO TABS
10.0000 mg | ORAL_TABLET | Freq: Two times a day (BID) | ORAL | Status: DC
  Administered 2022-03-14 – 2022-03-21 (×14): 10 mg via ORAL
  Filled 2022-03-14 (×14): qty 1

## 2022-03-14 MED ORDER — ACETAMINOPHEN 650 MG RE SUPP: 650.0000 mg | Freq: Four times a day (QID) | RECTAL | Status: DC | PRN

## 2022-03-14 MED ORDER — PREDNISONE 20 MG PO TABS
40.0000 mg | ORAL_TABLET | Freq: Every day | ORAL | Status: DC
  Administered 2022-03-16: 40 mg via ORAL
  Filled 2022-03-14: qty 2

## 2022-03-14 MED ORDER — GUAIFENESIN ER 600 MG PO TB12
600.0000 mg | ORAL_TABLET | Freq: Two times a day (BID) | ORAL | Status: DC
  Administered 2022-03-14 – 2022-03-21 (×14): 600 mg via ORAL
  Filled 2022-03-14 (×14): qty 1

## 2022-03-14 MED ORDER — ACETAMINOPHEN 325 MG PO TABS
650.0000 mg | ORAL_TABLET | Freq: Four times a day (QID) | ORAL | Status: DC | PRN
  Administered 2022-03-20 – 2022-03-21 (×3): 650 mg via ORAL
  Filled 2022-03-14 (×3): qty 2

## 2022-03-14 MED ORDER — METHYLPREDNISOLONE SODIUM SUCC 40 MG IJ SOLR
40.0000 mg | Freq: Two times a day (BID) | INTRAMUSCULAR | Status: AC
  Administered 2022-03-15 (×2): 40 mg via INTRAVENOUS
  Filled 2022-03-14 (×2): qty 1

## 2022-03-14 MED ORDER — LEVOTHYROXINE SODIUM 25 MCG PO TABS
25.0000 ug | ORAL_TABLET | Freq: Every day | ORAL | Status: DC
  Administered 2022-03-15 – 2022-03-21 (×7): 25 ug via ORAL
  Filled 2022-03-14 (×7): qty 1

## 2022-03-14 MED ORDER — TIOTROPIUM BROMIDE MONOHYDRATE 18 MCG IN CAPS
18.0000 ug | ORAL_CAPSULE | Freq: Every day | RESPIRATORY_TRACT | Status: DC
Start: 2022-03-15 — End: 2022-03-15
  Filled 2022-03-14: qty 5

## 2022-03-14 MED ORDER — ATORVASTATIN CALCIUM 20 MG PO TABS
40.0000 mg | ORAL_TABLET | Freq: Every day | ORAL | Status: DC
  Administered 2022-03-15 – 2022-03-20 (×6): 40 mg via ORAL
  Filled 2022-03-14 (×7): qty 2

## 2022-03-14 MED ORDER — QUETIAPINE FUMARATE 200 MG PO TABS
200.0000 mg | ORAL_TABLET | Freq: Every day | ORAL | Status: DC
  Administered 2022-03-14 – 2022-03-20 (×7): 200 mg via ORAL
  Filled 2022-03-14 (×4): qty 1
  Filled 2022-03-14 (×2): qty 8
  Filled 2022-03-14: qty 1

## 2022-03-14 MED ORDER — HYDROCOD POLI-CHLORPHE POLI ER 10-8 MG/5ML PO SUER
5.0000 mL | Freq: Two times a day (BID) | ORAL | Status: DC | PRN
  Administered 2022-03-18: 5 mL via ORAL
  Filled 2022-03-14: qty 5

## 2022-03-14 MED ORDER — LEVETIRACETAM 250 MG PO TABS
250.0000 mg | ORAL_TABLET | Freq: Two times a day (BID) | ORAL | Status: DC
  Administered 2022-03-14 – 2022-03-18 (×8): 250 mg via ORAL
  Filled 2022-03-14 (×8): qty 1

## 2022-03-14 MED ORDER — ENOXAPARIN SODIUM 40 MG/0.4ML IJ SOSY: 40.0000 mg | PREFILLED_SYRINGE | INTRAMUSCULAR | Status: DC

## 2022-03-14 MED ORDER — ENSURE ENLIVE PO LIQD
237.0000 mL | Freq: Two times a day (BID) | ORAL | Status: DC
  Administered 2022-03-15 – 2022-03-21 (×13): 237 mL via ORAL

## 2022-03-14 MED ORDER — FUROSEMIDE 20 MG PO TABS
40.0000 mg | ORAL_TABLET | Freq: Every day | ORAL | Status: DC
  Administered 2022-03-15 – 2022-03-19 (×5): 40 mg via ORAL
  Filled 2022-03-14 (×2): qty 1
  Filled 2022-03-14: qty 2
  Filled 2022-03-14 (×2): qty 1

## 2022-03-14 NOTE — Assessment & Plan Note (Signed)
-   The patient will be admitted to a medically monitored bed. - We will place the patient IV steroid therapy with IV Solu-Medrol as well as nebulized bronchodilator therapy with duonebs q.i.d. and q.4 hours p.r.n.[name] - Mucolytic therapy will be provided with Mucinex and antibiotic therapy with IV Levaquin. - O2 protocol will be followed.

## 2022-03-14 NOTE — ED Provider Notes (Signed)
[facility] Provider Note    Event Date/Time   First MD Initiated Contact with Patient 03/14/22 1833     (approximate)   History   Abdominal Pain   HPI  Carl [name] is a 61 y.o. male CHF, CAD, hypertension, CVA, renal failure, cirrhosis, polysubstance abuse, COPD, SI/HI and as listed in EMR presents to the emergency department for evaluation of chest and abdominal pain after sitting outside in a wheelchair today per EMS. Patient is either confused or experiencing flight of ideas. Recent evaluation here for SI/HI. Sent to [facility], then to [facility].   Physical Exam   Triage Vital Signs: ED Triage Vitals  Enc Vitals Group     BP 03/14/22 1633 105/78     Pulse Rate 03/14/22 1633 89     Resp 03/14/22 1633 17     Temp 03/14/22 1633 97.8 F (36.6 C)     Temp Source 03/14/22 1633 Oral     SpO2 03/14/22 1633 97 %     Weight --      Height --      Head Circumference --      Peak Flow --      Pain Score 03/14/22 1634 6     Pain Loc --      Pain Edu? --      Excl. in GC? --     Most recent vital signs: Vitals:   03/14/22 1841 03/14/22 1930  BP: 110/80 128/79  Pulse: 80 83  Resp: 16 (!) 24  Temp:    SpO2: 98% 93%    General: Awake, no distress.  CV:  Good peripheral perfusion.  Resp:  Normal effort. Diffuse wheezing. Abd:  Distended, soft, ventral hernia without concern for incarceration. Other:  Alert.    ED Results / Procedures / Treatments   Labs (all labs ordered are listed, but only abnormal results are displayed) Labs Reviewed  COMPREHENSIVE METABOLIC PANEL - Abnormal; Notable for the following components:      Result Value   CO2 21 (*)    Glucose, Bld 118 (*)    Creatinine, Ser 1.28 (*)    Albumin 3.3 (*)    All other components within normal limits  CBC - Abnormal; Notable for the following components:   Hemoglobin 12.2 (*)    HCT 37.7 (*)    RDW 17.2 (*)    All other components within normal limits  URINALYSIS,  ROUTINE W REFLEX MICROSCOPIC - Abnormal; Notable for the following components:   Color, Urine YELLOW (*)    APPearance CLEAR (*)    Protein, ur 100 (*)    All other components within normal limits  BRAIN NATRIURETIC PEPTIDE - Abnormal; Notable for the following components:   B Natriuretic Peptide 378.3 (*)    All other components within normal limits  BLOOD GAS, VENOUS - Abnormal; Notable for the following components:   pO2, Ven <31 (*)    All other components within normal limits  TROPONIN I (HIGH SENSITIVITY) - Abnormal; Notable for the following components:   Troponin I (High Sensitivity) 100 (*)    All other components within normal limits  LIPASE, BLOOD  VALPROIC ACID LEVEL  ETHANOL  AMMONIA     EKG  Atrial fibrillation with PVCs.   RADIOLOGY  Image interpreted and radiology report reviewed by me.  Cardiomegaly without acute change from previous on 02/15/22. CT head without obvious acute infarct/bleed.  Radiology report confirms above.  PROCEDURES:  Critical Care  performed: No  Procedures   MEDICATIONS ORDERED IN ED: Medications  methylPREDNISolone sodium succinate (SOLU-MEDROL) 125 mg/2 mL injection 125 mg (has no administration in time range)  ipratropium-albuterol (DUONEB) 0.5-2.5 (3) MG/3ML nebulizer solution 3 mL (3 mLs Nebulization Given 03/14/22 1945)     IMPRESSION / MDM / ASSESSMENT AND PLAN / ED COURSE   I have reviewed the triage note.  Differential diagnosis includes, but is not limited to encephalopathy, delirium, CVA, CO2 retention; COPD exacerbation, CHF exacerbation, cardiac event.  61 year old male presents to the ER via EMS. On my exam, he is unable to tell me why he is here. He does not complete a thought. Does not directly answer questions. He is wheezing on exam. Duoneb, solumedrol, chest x-ray ordered in addition to labs. Plan will be to move patient to main ER where he can be monitored closely.   Care relinquished to Dr. Larinda Buttery who  will assume care.      FINAL CLINICAL IMPRESSION(S) / ED DIAGNOSES   Final diagnoses:  COPD exacerbation (HCC)     Rx / DC Orders   ED Discharge Orders     None        Note:  This document was prepared using Dragon voice recognition software and may include unintentional dictation errors.   Chinita Pester, FNP 03/14/22 1956    Chesley Noon, MD 03/15/22 [phone]

## 2022-03-14 NOTE — ED Notes (Signed)
First nurse note-pt brought in via em[facility].  Ems report sob, pt has been sitting outside in a wheelchair in the heat today.  Pt sitting in a wheelchair, alert.

## 2022-03-14 NOTE — ED Provider Triage Note (Signed)
Emergency Medicine Provider Triage Evaluation Note  Victory Strollo , a 61 y.o. male  was evaluated in triage.  Pt complains of CP and Abd pain.  Presents to the ED via EMS after being found sitting outside in the heat.  Denies any fevers, chills, nausea, vomiting, or diarrhea.  Review of Systems  Positive: CP, ABD pain Negative: FCS  Physical Exam  BP 105/78 (BP Location: Right Arm)   Pulse 89   Temp 97.8 F (36.6 C) (Oral)   Resp 17   SpO2 97%  Gen:   Awake, no distress  NAD Resp:  Normal effort CTA MSK:   Moves extremities without difficulty  CVS:  RRR  Medical Decision Making  Medically screening exam initiated at 4:42 PM.  Appropriate orders placed.  Yue Arocho was informed that the remainder of the evaluation will be completed by another provider, this initial triage assessment does not replace that evaluation, and the importance of remaining in the ED until their evaluation is complete.  Patient to the ED for evaluation of chest pain and abdominal pain.  Patient reports onset of symptoms today.  He presents to the ED via EMS.   Lissa Hoard, PA-C 03/14/22 1645

## 2022-03-14 NOTE — Assessment & Plan Note (Signed)
-   We will continue statin therapy. 

## 2022-03-14 NOTE — ED Triage Notes (Signed)
Pt presents to ED with c/o of CP and ABD pain, pt states this has been ongoing since today.   EMS states pain was sitting out in the heat today.  Pt denies fevers or chills.

## 2022-03-14 NOTE — H&P (Signed)
[place]   PATIENT NAME: Carl [name]    MR#:  [id number]  DATE OF BIRTH:  1961-01-02  DATE OF ADMISSION:  03/14/2022  PRIMARY CARE PHYSICIAN: [facility]   Patient is coming from: Home  REQUESTING/REFERRING PHYSICIAN: Chesley Noon, MD  CHIEF COMPLAINT:   Chief Complaint  Patient presents with   Abdominal Pain    HISTORY OF PRESENT ILLNESS:  Carl [name] is a 61 y.o. male with medical history significant for bipolar disorder, CHF, COPD, depression, hypertension, coronary artery disease s/p NSTEMI, stroke and seizure disorder, who presented to the ER with acute onset of epigastric abdominal and left-sided chest pain when he was sitting outside in a wheelchair today per EMS.  He was fairly confused in the ER was experiencing flight of ideas was recently evaluated for SI/HI ER, sent to [place] then to [facility].  He has been having dyspnea with associated wheezing as well as a cough with inability to expectorate.  No fever or chills.  No nausea or vomiting or abdominal pain.  No dysuria, oliguria or hematuria or flank pain reported.  On 02/16/2022 the patient had a 2D echo with EF of 25 to 30% with indeterminate left ventricular diastolic parameters, severe left atrial dilatation and mild right atrial dilatation with mild mitral valve regurgitation and mild aortic regurgitation  ED Course: When he came to the ER, vital signs were within normal and later respiratory it was up to 35 and heart rate up to 102.  Labs revealed blood gas with pH 7.37 HCO3 27.7.  Ammonia level was less than 10.  High sensitive troponin I was20.  CMP revealed a creatinine of 1.28 down from 1.75 on 8/29 with a CO2 of 21 and albumin of 3.3.  BNP was 378.3.  CBC showed anemia with hemoglobin of 12.2 and hematocrit 37.7 compared to 14.2 and 42.8 on 8/29.  UA was negative and urine drug screen was unremarkable alcohol level was less than 10 salicylate less than 7 EKG as reviewed by me :  EKG showed atrial fibrillation with controlled response of 86 with PVCs, right bundle branch block and T wave inversion anterolaterally Imaging: Two-view chest x-ray showed cardiomegaly and his pacemaker with no significant acute abnormalities.  The patient was given DuoNebs X.3 and and 125 mg of IV Solu-Medrol.  He will be admitted to a medical telemetry bed for further evaluation and management. 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   Seizures (HCC)    childhood   Stroke (HCC)    Tobacco abuse     PAST SURGICAL HISTORY:   Past Surgical History:  Procedure Laterality Date   BIV UPGRADE N/A 05/05/2020   Procedure: BIV ICD UPGRADE;  Surgeon: Duke Salvia, MD;  Location: [facility] INVASIVE CV LAB;  Service: Cardiovascular;  Laterality: N/A;   CORONARY ARTERY BYPASS GRAFT      SOCIAL HISTORY:   Social History   Tobacco Use   Smoking status: Every Day  Packs/day: 0.25    Years: 0.00    Total pack years: 0.00    Types: Cigarettes   Smokeless tobacco: Never  Substance Use Topics   Alcohol use: Not Currently    FAMILY HISTORY:   Family History  Problem Relation Age of Onset   Cancer Mother        "femal cancer"   Prostate cancer Father     DRUG ALLERGIES:   Allergies  Allergen Reactions   Iodinated Contrast Media Shortness Of Breath   Penicillins Anaphylaxis and Shortness Of Breath    Respiratory  Tolerated cefuroxime on 01/06/16   Strawberry Extract Anaphylaxis   Cefepime Itching    Empiric antibiotic, developed pruritis.     Erythromycin Itching   Sulfa Antibiotics Itching, Nausea And Vomiting and Nausea Only    REVIEW OF SYSTEMS:   ROS As per history of present illness. All pertinent systems were reviewed above. Constitutional, HEENT, cardiovascular, respiratory, GI, GU, musculoskeletal, neuro, psychiatric, endocrine, integumentary and hematologic systems were reviewed and are otherwise negative/unremarkable except for positive findings mentioned above in the HPI.   MEDICATIONS AT HOME:   Prior to Admission medications   Medication Sig Start Date End Date Taking? Authorizing Provider  acetaminophen (TYLENOL) 500 MG tablet Take 500 mg by mouth every 6 (six) hours as needed for mild pain.    [provider]  albuterol (VENTOLIN HFA) 108 (90 Base) MCG/ACT inhaler Inhale 2 puffs into the lungs every 6 (six) hours as needed for wheezing. 05/24/19 04/28/22  Clapacs, Madie Reno, MD  apixaban (ELIQUIS) 5 MG TABS tablet TAKE 1 TABLET BY MOUTH TWICE A DAY 01/18/21   Deboraha Sprang, MD  atorvastatin (LIPITOR) 40 MG tablet Take 1 tablet (40 mg total) by mouth daily. 09/20/21   Wellington Hampshire, MD  carvedilol (COREG) 3.125 MG tablet Take 3.125 mg by mouth 2 (two) times daily with a meal.    [provider]  divalproex (DEPAKOTE ER) 500 MG 24 hr tablet Take 1 tablet (500 mg total) by mouth 2 (two) times daily. 05/24/19   Clapacs, Madie Reno, MD  escitalopram (LEXAPRO) 10 MG tablet Take 1 tablet (10 mg total) by mouth at bedtime. 05/24/19   Clapacs, Madie Reno, MD  feeding supplement (ENSURE ENLIVE / ENSURE PLUS) LIQD Take 237 mLs by mouth 2 (two) times daily between meals. 05/26/21   Loletha Grayer, MD  Fluticasone-Salmeterol (ADVAIR) 250-50 MCG/DOSE AEPB Inhale 1 puff into the lungs 2 (two) times daily. 05/26/21   Loletha Grayer, MD  furosemide (LASIX) 40 MG tablet Take 1 tablet (40 mg total) by mouth daily. 08/01/20   Deboraha Sprang, MD  Hydrocortisone Acetate 1 % CREA SMARTSIG:sparingly Topical 3 Times Daily  12/24/21   [provider]  hydrOXYzine (ATARAX) 10 MG tablet Take 10 mg by mouth 2 (two) times daily. 12/24/21   [provider]  levETIRAcetam (KEPPRA) 250 MG tablet Take 1 tablet (250 mg total) by mouth 2 (two) times daily. 03/24/20   Harvest Dark, MD  levothyroxine (SYNTHROID) 25 MCG tablet Take 1 tablet (25 mcg total) by mouth daily. 05/24/19   Clapacs, Madie Reno, MD  lisinopril (ZESTRIL) 2.5 MG tablet TAKE 1 TABLET BY MOUTH ONCE DAILY Patient not taking: Reported on 03/06/2022 01/21/21   Loel Dubonnet, NP  omeprazole (PRILOSEC) 20 MG capsule Take 20 mg by mouth daily.    [provider]  predniSONE (DELTASONE) 10 MG tablet Take 10 mg by mouth daily with breakfast.  [provider]  QUEtiapine (SEROQUEL) 200 MG tablet Take 200 mg by mouth at bedtime.    [provider]  tiotropium (SPIRIVA) 18 MCG inhalation capsule Place 18 mcg into inhaler and inhale daily.    [provider]  traMADol (ULTRAM) 50 MG tablet Take 50 mg by mouth 2 (two) times daily as needed. 01/16/22   [provider]      VITAL SIGNS:  Blood pressure 123/88, pulse 80, temperature 98.1 F (36.7 C), temperature source Oral, resp. rate (!) 29, height 5\' 11"  (1.803 m), weight 87 kg, SpO2 95 %.  PHYSICAL EXAMINATION:  Physical Exam  GENERAL:  61 y.o.-year-old patient lying in the bed with no acute distress.  EYES: Pupils equal, round, reactive to light and accommodation. No scleral icterus. Extraocular muscles intact.  HEENT: Head atraumatic, normocephalic. Oropharynx and nasopharynx clear.  NECK:  Supple, no jugular venous distention. No thyroid enlargement, no tenderness.  LUNGS: Diffuse expiratory wheezes with tight expiratory airflow and harsh vesicular breathing.  No use of accessory muscles of respiration.  CARDIOVASCULAR: Regular rate and rhythm, S1, S2 normal. No murmurs, rubs, or gallops.  ABDOMEN: Soft, nondistended, nontender. Bowel sounds  present. No organomegaly or mass.  EXTREMITIES: No pedal edema, cyanosis, or clubbing.  NEUROLOGIC: Cranial nerves II through XII are intact. Muscle strength 5/5 in all extremities. Sensation intact. Gait not checked.  PSYCHIATRIC: The patient is alert and oriented x 3.  Normal affect and good eye contact. SKIN: No obvious rash, lesion, or ulcer.   LABORATORY PANEL:   CBC Recent Labs  Lab 03/14/22 1638  WBC 8.6  HGB 12.2*  HCT 37.7*  PLT 212   ------------------------------------------------------------------------------------------------------------------  Chemistries  Recent Labs  Lab 03/14/22 1638  NA 138  K 4.4  CL 107  CO2 21*  GLUCOSE 118*  BUN 18  CREATININE 1.28*  CALCIUM 9.6  AST 40  ALT 42  ALKPHOS 82  BILITOT 0.6   ------------------------------------------------------------------------------------------------------------------  Cardiac Enzymes No results for input(s): "TROPONINI" in the last 168 hours. ------------------------------------------------------------------------------------------------------------------  RADIOLOGY:  No results found.    IMPRESSION AND PLAN:  Assessment and Plan: * COPD exacerbation ([place]) - The patient will be admitted to a medically monitored bed. - We will place the patient IV steroid therapy with IV Solu-Medrol as well as nebulized bronchodilator therapy with duonebs q.i.d. and q.4 hours p.r.n.[name] - Mucolytic therapy will be provided with Mucinex and antibiotic therapy with IV Levaquin. - O2 protocol will be followed.    Chest pain - This is associated with elevated troponin I though there is no significant change in delta.  It could be related to demand ischemia.  Will need to rule out acute coronary syndrome. - Cardiology consult will be obtained. - I notified Dr. Rockey Situ about the patient.  Seizure disorder ([place]) - We will continue his Keppra.  Paroxysmal atrial fibrillation (HCC) - We will continue his  Eliquis and Coreg.  Dyslipidemia - We will continue statin therapy.  Hypothyroidism - We will continue Synthroid.  Essential hypertension - We will continue his antihypertensives.  Bipolar 1 disorder ([place]) - We will continue his Depakote and Seroquel.    DVT prophylaxis: Lovenox.  Advanced Care Planning:  Code Status: full code.  Family Communication:  The plan of care was discussed in details with the patient (and family). I answered all questions. The patient agreed to proceed with the above mentioned plan. Further management will depend upon hospital course. Disposition Plan: Back to previous home environment Consults  called: Cardiology. All the records are reviewed and case discussed with ED provider.  Status is: Inpatient    At the time of the admission, it appears that the appropriate admission status for this patient is inpatient.  This is judged to be reasonable and necessary in order to provide the required intensity of service to ensure the patient's safety given the presenting symptoms, physical exam findings and initial radiographic and laboratory data in the context of comorbid conditions.  The patient requires inpatient status due to high intensity of service, high risk of further deterioration and high frequency of surveillance required.  I certify that at the time of admission, it is my clinical judgment that the patient will require inpatient hospital care extending more than 2 midnights.                            Dispo: The patient is from: Home              Anticipated d/c is to: Home              Patient currently is not medically stable to d/c.              Difficult to place patient: No  Hannah Beat M.D on 03/15/2022 at 12:28 AM  [facility]   From 7 PM-7 AM, contact night-coverage [email or web address]  CC: Primary care physician; [facility]

## 2022-03-14 NOTE — Assessment & Plan Note (Signed)
-   We will continue Synthroid. 

## 2022-03-14 NOTE — Assessment & Plan Note (Signed)
-   We will continue his antihypertensives. 

## 2022-03-14 NOTE — ED Provider Notes (Signed)
-----------------------------------------   7:17 PM on 03/14/2022 -----------------------------------------  Blood pressure 110/80, pulse 80, temperature 97.8 F (36.6 C), temperature source Oral, resp. rate 16, height 5\' 11"  (1.803 m), weight 87 kg, SpO2 98 %.  Assuming care from NP Triplett.  In short, Carl [name] is a 61 y.o. male with a chief complaint of Abdominal Pain .  Refer to the original H&P for additional details.  The current plan of care is to follow-up CT imaging of head and additional labs for AMS.  ----------------------------------------- 11:08 PM on 03/14/2022 ----------------------------------------- CT head is negative for acute process, chest x-ray also unremarkable.  Patient noted to have elevated troponin at 100, prior troponins have been similarly elevated although not quite this high in the past.  On reassessment, patient complains of some chest pain but primarily complains of pain in his abdomen.  He is diffusely tender to palpation we will further assess with CT scan.  He also appears somewhat tachypneic with increased work of breathing, currently maintaining oxygen saturations on room air.  He has wheezing bilaterally despite initial breathing treatment given by previous provider, we will give IV Solu-Medrol as well as follow-up breathing treatment.  CT abdomen/pelvis is unremarkable, patient continues to be slightly tachypneic with ongoing wheezing.  We will give additional breathing treatment, however follow-up troponin is uptrending now at 120.  Overall low suspicion for ACS and we will hold off on heparin, but patient would benefit from admission for further management of COPD.  Case discussed with hospitalist for admission.    05/14/2022, MD 03/14/22 [phone]

## 2022-03-14 NOTE — Assessment & Plan Note (Signed)
-   We will continue his Depakote and Seroquel.

## 2022-03-14 NOTE — Assessment & Plan Note (Signed)
-   We will continue his Eliquis and Coreg.

## 2022-03-14 NOTE — ED Notes (Signed)
MD states to hold off on Solumedrol for the time being.

## 2022-03-14 NOTE — ED Notes (Signed)
RT called to notify them of VBG for pt waiting in lab.

## 2022-03-14 NOTE — ED Notes (Signed)
See triage note  Presents via EMS from [place] [facility]   States he was just released from hospital today  Then while standing outside he became SOB and felt his legs was [employer]

## 2022-03-14 NOTE — Telephone Encounter (Signed)
Patient did not show for his [facility] appointment on 03/14/22. Will attempt to reschedule.

## 2022-03-14 NOTE — Assessment & Plan Note (Signed)
-   We will continue his Keppra. 

## 2022-03-15 DIAGNOSIS — J441 Chronic obstructive pulmonary disease with (acute) exacerbation: Principal | ICD-10-CM

## 2022-03-15 DIAGNOSIS — R079 Chest pain, unspecified: Secondary | ICD-10-CM | POA: Diagnosis present

## 2022-03-15 LAB — CBC
HCT: 35.6 % — ABNORMAL LOW (ref 39.0–52.0)
Hemoglobin: 11.8 g/dL — ABNORMAL LOW (ref 13.0–17.0)
MCH: 29.2 pg (ref 26.0–34.0)
MCHC: 33.1 g/dL (ref 30.0–36.0)
MCV: 88.1 fL (ref 80.0–100.0)
Platelets: 207 10*3/uL (ref 150–400)
RBC: 4.04 MIL/uL — ABNORMAL LOW (ref 4.22–5.81)
RDW: 16.9 % — ABNORMAL HIGH (ref 11.5–15.5)
WBC: 5.1 10*3/uL (ref 4.0–10.5)
nRBC: 0 % (ref 0.0–0.2)

## 2022-03-15 LAB — BASIC METABOLIC PANEL
Anion gap: 9 (ref 5–15)
BUN: 17 mg/dL (ref 8–23)
CO2: 21 mmol/L — ABNORMAL LOW (ref 22–32)
Calcium: 9.3 mg/dL (ref 8.9–10.3)
Chloride: 108 mmol/L (ref 98–111)
Creatinine, Ser: 1.1 mg/dL (ref 0.61–1.24)
GFR, Estimated: >60 mL/min (ref >60)
Glucose, Bld: 280 mg/dL — ABNORMAL HIGH (ref 70–99)
Potassium: 4.4 mmol/L (ref 3.5–5.1)
Sodium: 138 mmol/L (ref 135–145)

## 2022-03-15 LAB — TROPONIN I (HIGH SENSITIVITY): Troponin I (High Sensitivity): 56 ng/L — ABNORMAL HIGH (ref <18)

## 2022-03-15 LAB — RESP PANEL BY RT-PCR (FLU A&B, COVID) ARPGX2
Influenza A by PCR: NEGATIVE
Influenza B by PCR: NEGATIVE
SARS Coronavirus 2 by RT PCR: NEGATIVE

## 2022-03-15 MED ORDER — BUDESONIDE 0.25 MG/2ML IN SUSP
0.2500 mg | Freq: Two times a day (BID) | RESPIRATORY_TRACT | Status: DC
  Administered 2022-03-15 – 2022-03-21 (×13): 0.25 mg via RESPIRATORY_TRACT
  Filled 2022-03-15 (×14): qty 2

## 2022-03-15 MED ORDER — LEVOFLOXACIN IN D5W 750 MG/150ML IV SOLN
750.0000 mg | INTRAVENOUS | Status: DC
  Administered 2022-03-15: 750 mg via INTRAVENOUS
  Filled 2022-03-15: qty 150

## 2022-03-15 MED ORDER — ARFORMOTEROL TARTRATE 15 MCG/2ML IN NEBU
15.0000 ug | INHALATION_SOLUTION | Freq: Two times a day (BID) | RESPIRATORY_TRACT | Status: DC
  Administered 2022-03-15 – 2022-03-21 (×12): 15 ug via RESPIRATORY_TRACT
  Filled 2022-03-15 (×14): qty 2

## 2022-03-15 MED ORDER — IPRATROPIUM-ALBUTEROL 0.5-2.5 (3) MG/3ML IN SOLN
3.0000 mL | Freq: Four times a day (QID) | RESPIRATORY_TRACT | Status: DC | PRN
  Administered 2022-03-16 – 2022-03-18 (×4): 3 mL via RESPIRATORY_TRACT
  Filled 2022-03-15 (×2): qty 3

## 2022-03-15 NOTE — Assessment & Plan Note (Signed)
-   This is associated with elevated troponin I though there is no significant change in delta.  It could be related to demand ischemia.  Will need to rule out acute coronary syndrome. - Cardiology consult will be obtained. - I notified Dr. Mariah Milling about the patient.

## 2022-03-15 NOTE — Progress Notes (Signed)
PROGRESS NOTE    Carl [name]  ZOX:[id number] DOB: 12-12-1960 DOA: 03/14/2022 PCP: [facility]    Brief Narrative:  y.o. male with medical history significant for bipolar disorder, CHF, COPD, depression, hypertension, coronary artery disease s/p NSTEMI, stroke and seizure disorder, who presented to the ER with acute onset of epigastric abdominal and left-sided chest pain when he was sitting outside in a wheelchair today per EMS.  He was fairly confused in the ER was experiencing flight of ideas was recently evaluated for SI/HI ER, [facility]  He has been having dyspnea with associated wheezing as well as a cough with inability to expectorate.  No fever or chills.  No nausea or vomiting or abdominal pain.  No dysuria, oliguria or hematuria or flank pain reported.   On 02/16/2022 the patient had a 2D echo with EF of 25 to 30% with indeterminate left ventricular diastolic parameters, severe left atrial dilatation and mild right atrial dilatation with mild mitral valve regurgitation and mild aortic regurgitation.  Per outpatient cardiology notes patient has AICD in place  Current presentation is more consistent with decompensated COPD.  Cardiology consult on admission has been deferred at this time.   Assessment & Plan:   Principal Problem:   COPD exacerbation (HCC) Active Problems:   Chest pain   Bipolar 1 disorder (HCC)   Essential hypertension   Hypothyroidism   Dyslipidemia   Paroxysmal atrial fibrillation (HCC)   Seizure disorder (HCC)  Acute exacerbation of COPD Patient presented with cough, inability to clear secretions, audible wheeze Presentation consistent with acute decompensated COPD Patient on 2 L nasal cannula Plan: IV Solu-Medrol x2 doses today Stop prednisone tomorrow 4-hour DuoNeb Twice daily Brovana Twice daily Pulmicort Oxygen as needed  Elevated troponin Atypical chest pain Likely demand ischemia Pain now  resolved No indication of ACS Consult deferred  Chronic systolic congestive heart failure Recent EF 25 to 30% Signs of decompensated heart failure Continue GDMT  Seizure disorder PTA Keppra  Paroxysmal atrial fibrillation Controlled Continue PTA Eliquis and Coreg  Hyperlipidemia PTA statin  Hypothyroidism PTA Synthroid  Essential hypertension BP controlled Continue current regimen per home doses  Bipolar 1 disorder Stable Depakote and Seroquel    DVT prophylaxis: Eliquis Code Status: Full Family Communication: None today Disposition Plan: Status is: Inpatient Remains inpatient appropriate because: Acute exacerbation of COPD on IV steroids and aggressive nebulizer regimen   Level of care: Telemetry Medical  Consultants:  None  Procedures:  None  Antimicrobials: None   Subjective: Seen and examined.  Reports shortness of breath.  Reports some mild intermittent chest pain improved from prior.  Objective: Vitals:   03/15/22 0349 03/15/22 0801 03/15/22 0847 03/15/22 0957  BP: (!) 122/94 (!) 133/90    Pulse: 97 85    Resp: (!) 22 16    Temp: 97.7 F (36.5 C)     TempSrc: Oral     SpO2: 98% 99% 99% 99%  Weight:      Height:        Intake/Output Summary (Last 24 hours) at 03/15/2022 1245 Last data filed at 03/15/2022 1000 Gross per 24 hour  Intake 557.48 ml  Output 850 ml  Net -292.52 ml   Filed Weights   03/14/22 1838 03/15/22 0041  Weight: 87 kg 85.5 kg    Examination:  General exam: In no acute distress Respiratory system: Diffuse end expiratory wheeze.  Normal work of breathing.  2 L Cardiovascular system: Distant heart sounds,  S1-S2, no appreciable murmurs Gastrointestinal system: Soft, NT/ND, normal bowel sounds Central nervous system: Alert and oriented. No focal neurological deficits. Extremities: Symmetric 5 x 5 power. Skin: No rashes, lesions or ulcers Psychiatry: Judgement and insight appear normal. Mood & affect appropriate.      Data Reviewed: I have personally reviewed following labs and imaging studies  CBC: Recent Labs  Lab 03/14/22 1638 03/15/22 0511  WBC 8.6 5.1  HGB 12.2* 11.8*  HCT 37.7* 35.6*  MCV 89.1 88.1  PLT 212 207   Basic Metabolic Panel: Recent Labs  Lab 03/14/22 1638 03/15/22 0511  NA 138 138  K 4.4 4.4  CL 107 108  CO2 21* 21*  GLUCOSE 118* 280*  BUN 18 17  CREATININE 1.28* 1.10  CALCIUM 9.6 9.3   GFR: Estimated Creatinine Clearance: 74.5 mL/min (by C-G formula based on SCr of 1.1 mg/dL). Liver Function Tests: Recent Labs  Lab 03/14/22 1638  AST 40  ALT 42  ALKPHOS 82  BILITOT 0.6  PROT 6.9  ALBUMIN 3.3*   Recent Labs  Lab 03/14/22 1638  LIPASE 40   Recent Labs  Lab 03/14/22 1930  AMMONIA <10   Coagulation Profile: No results for input(s): "INR", "PROTIME" in the last 168 hours. Cardiac Enzymes: No results for input(s): "CKTOTAL", "CKMB", "CKMBINDEX", "TROPONINI" in the last 168 hours. BNP (last 3 results) No results for input(s): "PROBNP" in the last 8760 hours. HbA1C: No results for input(s): "HGBA1C" in the last 72 hours. CBG: No results for input(s): "GLUCAP" in the last 168 hours. Lipid Profile: No results for input(s): "CHOL", "HDL", "LDLCALC", "TRIG", "CHOLHDL", "LDLDIRECT" in the last 72 hours. Thyroid Function Tests: No results for input(s): "TSH", "T4TOTAL", "FREET4", "T3FREE", "THYROIDAB" in the last 72 hours. Anemia Panel: No results for input(s): "VITAMINB12", "FOLATE", "FERRITIN", "TIBC", "IRON", "RETICCTPCT" in the last 72 hours. Sepsis Labs: No results for input(s): "PROCALCITON", "LATICACIDVEN" in the last 168 hours.  Recent Results (from the past 240 hour(s))  SARS Coronavirus 2 by RT PCR (hospital order, performed in [facility] hospital lab) *cepheid single result test* Anterior Nasal Swab     Status: None   Collection Time: 03/05/22 11:07 PM   Specimen: Anterior Nasal Swab  Result Value Ref Range Status   SARS Coronavirus  2 by RT PCR NEGATIVE NEGATIVE Final    Comment: (NOTE) SARS-CoV-2 target nucleic acids are NOT DETECTED.  The SARS-CoV-2 RNA is generally detectable in upper and lower respiratory specimens during the acute phase of infection. The lowest concentration of SARS-CoV-2 viral copies this assay can detect is 250 copies / mL. A negative result does not preclude SARS-CoV-2 infection and should not be used as the sole basis for treatment or other patient management decisions.  A negative result may occur with improper specimen collection / handling, submission of specimen other than nasopharyngeal swab, presence of viral mutation(s) within the areas targeted by this assay, and inadequate number of viral copies (<250 copies / mL). A negative result must be combined with clinical observations, patient history, and epidemiological information.  Fact Sheet for Patients:   RoadLapTop.co.za  Fact Sheet for Healthcare Providers: http://kim-miller.com/  This test is not yet approved or  cleared by the [place] FDA and has been authorized for detection and/or diagnosis of SARS-CoV-2 by FDA under an Emergency Use Authorization (EUA).  This EUA will remain in effect (meaning this test can be used) for the duration of the COVID-19 declaration under Section 564(b)(1) of the Act, 21 U.S.C. section 360bbb-3(b)(1),  unless the authorization is terminated or revoked sooner.  Performed at [facility], [address] [place], [place] [address]   Resp Panel by RT-PCR (Flu A&B, Covid) Anterior Nasal Swab     Status: None   Collection Time: 03/14/22 11:58 PM   Specimen: Anterior Nasal Swab  Result Value Ref Range Status   SARS Coronavirus 2 by RT PCR NEGATIVE NEGATIVE Final    Comment: (NOTE) SARS-CoV-2 target nucleic acids are NOT DETECTED.  The SARS-CoV-2 RNA is generally detectable in upper respiratory specimens during the acute phase of  infection. The lowest concentration of SARS-CoV-2 viral copies this assay can detect is 138 copies/mL. A negative result does not preclude SARS-Cov-2 infection and should not be used as the sole basis for treatment or other patient management decisions. A negative result may occur with  improper specimen collection/handling, submission of specimen other than nasopharyngeal swab, presence of viral mutation(s) within the areas targeted by this assay, and inadequate number of viral copies(<138 copies/mL). A negative result must be combined with clinical observations, patient history, and epidemiological information. The expected result is Negative.  Fact Sheet for Patients:  BloggerCourse.com  Fact Sheet for Healthcare Providers:  SeriousBroker.it  This test is no t yet approved or cleared by the [place] FDA and  has been authorized for detection and/or diagnosis of SARS-CoV-2 by FDA under an Emergency Use Authorization (EUA). This EUA will remain  in effect (meaning this test can be used) for the duration of the COVID-19 declaration under Section 564(b)(1) of the Act, 21 U.S.C.section 360bbb-3(b)(1), unless the authorization is terminated  or revoked sooner.       Influenza A by PCR NEGATIVE NEGATIVE Final   Influenza B by PCR NEGATIVE NEGATIVE Final    Comment: (NOTE) The Xpert Xpress SARS-CoV-2/FLU/RSV plus assay is intended as an aid in the diagnosis of influenza from Nasopharyngeal swab specimens and should not be used as a sole basis for treatment. Nasal washings and aspirates are unacceptable for Xpert Xpress SARS-CoV-2/FLU/RSV testing.  Fact Sheet for Patients: BloggerCourse.com  Fact Sheet for Healthcare Providers: SeriousBroker.it  This test is not yet approved or cleared by the [place] FDA and has been authorized for detection and/or diagnosis of SARS-CoV-2  by FDA under an Emergency Use Authorization (EUA). This EUA will remain in effect (meaning this test can be used) for the duration of the COVID-19 declaration under Section 564(b)(1) of the Act, 21 U.S.C. section 360bbb-3(b)(1), unless the authorization is terminated or revoked.  Performed at [facility], [address], [place], [place] [address]          Radiology Studies: CT Abdomen Pelvis Wo Contrast  Result Date: 03/14/2022 CLINICAL DATA:  Abdominal pain. EXAM: CT ABDOMEN AND PELVIS WITHOUT CONTRAST TECHNIQUE: Multidetector CT imaging of the abdomen and pelvis was performed following the standard protocol without IV contrast. RADIATION DOSE REDUCTION: This exam was performed according to the departmental dose-optimization program which includes automated exposure control, adjustment of the mA and/or kV according to patient size and/or use of iterative reconstruction technique. COMPARISON:  Abdominal ultrasound dated 02/16/2022. FINDINGS: Evaluation of this exam is limited in the absence of intravenous contrast. Lower chest: The visualized lung bases are clear. There is cardiomegaly. Mechanical mitral bowel. There is coronary vascular calcification. Cardiac pacemaker wires noted. No intra-abdominal free air or free fluid. Hepatobiliary: No focal liver abnormality is seen. No gallstones, gallbladder wall thickening, or biliary dilatation. Pancreas: Unremarkable. No pancreatic ductal dilatation or surrounding inflammatory changes.  Spleen: Normal in size without focal abnormality. Adrenals/Urinary Tract: The adrenal glands are unremarkable. Probable punctate nonobstructing right renal calculi. No hydronephrosis. There is no hydronephrosis or nephrolithiasis on the left. There is a 1.3 cm partially exophytic hypodense lesion from the posterior upper pole of the left kidney which is not characterized on this noncontrast CT but demonstrates fluid attenuation, likely a cyst. The visualized  ureters and urinary bladder appear unremarkable. Stomach/Bowel: There is no bowel obstruction or active inflammation. Several scattered colonic diverticula without active inflammatory changes. The appendix is normal. Vascular/Lymphatic: Advanced aortoiliac atherosclerotic disease. There is a 3.1 cm infrarenal abdominal aortic aneurysm. The IVC is unremarkable. No portal venous gas. There is no adenopathy. Reproductive: The prostate and seminal vesicles are grossly unremarkable. No pelvic mass. Other: None Musculoskeletal: No acute or significant osseous findings. IMPRESSION: 1. No acute intra-abdominal or pelvic pathology. 2. Colonic diverticulosis. No bowel obstruction. Normal appendix. 3. A 3.1 cm infrarenal abdominal aortic aneurysm. Recommend follow-up ultrasound every 3 years. This recommendation follows ACR consensus guidelines: White Paper of the ACR Incidental Findings Committee II on Vascular Findings. J Am Coll Radiol 2013; 31:540-086. 4. Aortic Atherosclerosis (ICD10-I70.0). Electronically Signed   By: Elgie Collard M.D.   On: 03/14/2022 21:19   CT Head Wo Contrast  Result Date: 03/14/2022 CLINICAL DATA:  Mental status change, unknown cause EXAM: CT HEAD WITHOUT CONTRAST TECHNIQUE: Contiguous axial images were obtained from the base of the skull through the vertex without intravenous contrast. RADIATION DOSE REDUCTION: This exam was performed according to the departmental dose-optimization program which includes automated exposure control, adjustment of the mA and/or kV according to patient size and/or use of iterative reconstruction technique. COMPARISON:  CT head 05/22/2021 BRAIN: BRAIN Patchy and confluent areas of decreased attenuation are noted throughout the deep and periventricular white matter of the cerebral hemispheres bilaterally, compatible with chronic microvascular ischemic disease. Redemonstration of left parieto-occipital and temporal encephalomalacia consistent with prior left  middle cerebral artery infarction. No evidence of large-territorial acute infarction. No parenchymal hemorrhage. No mass lesion. No extra-axial collection. No mass effect or midline shift. No hydrocephalus. Basilar cisterns are patent. Vascular: No hyperdense vessel. Atherosclerotic calcifications are present within the cavernous internal carotid arteries. Skull: No acute fracture or focal lesion. Sinuses/Orbits: Complete opacification of the left maxillary sinus. Paranasal sinuses and mastoid air cells are clear. The orbits are unremarkable. Other: None. IMPRESSION: No acute intracranial abnormality. Electronically Signed   By: Tish Frederickson M.D.   On: 03/14/2022 19:18   DG Chest 2 View  Result Date: 03/14/2022 CLINICAL DATA:  Shortness of breath. EXAM: CHEST - 2 VIEW COMPARISON:  02/15/2022 radiograph and CT FINDINGS: Stable cardiomegaly post median sternotomy. Prosthetic cardiac valve. Left-sided pacemaker in place. Mild chronic peribronchial thickening without focal airspace disease. No pleural effusion or pneumothorax. Left clavicle hardware. IMPRESSION: Stable cardiomegaly post median sternotomy and cardiac valve replacement. No acute chest finding. Electronically Signed   By: Narda Rutherford M.D.   On: 03/14/2022 19:13        Scheduled Meds:  apixaban  5 mg Oral BID   arformoterol  15 mcg Nebulization BID   atorvastatin  40 mg Oral Daily   budesonide (PULMICORT) nebulizer solution  0.25 mg Nebulization BID   carvedilol  3.125 mg Oral BID WC   divalproex  500 mg Oral BID   escitalopram  10 mg Oral QHS   feeding supplement  237 mL Oral BID BM   furosemide  40 mg Oral Q breakfast  guaiFENesin  600 mg Oral BID   hydrOXYzine  10 mg Oral BID   levETIRAcetam  250 mg Oral BID   levothyroxine  25 mcg Oral Q0600   methylPREDNISolone (SOLU-MEDROL) injection  40 mg Intravenous Q12H   Followed by   Melene Muller ON 03/16/2022] predniSONE  40 mg Oral Q breakfast   pantoprazole  40 mg Oral Daily    QUEtiapine  200 mg Oral QHS   Continuous Infusions:   LOS: 1 day     Tresa Moore, MD [facility]   If 7PM-7AM, please contact night-coverage  03/15/2022, 12:45 PM

## 2022-03-15 NOTE — TOC Initial Note (Signed)
Transition of Care [facility]) - Initial/Assessment Note    Patient Details  Name: Carl [name] MRN: [id number] Date of Birth: Dec 04, 1960  [facility] CM/SW Contact:    Chapman Fitch, RN Phone Number: 03/15/2022, 2:42 PM  Clinical Narrative:                  Admitted for: COPD [facility] Current home health/prior home health/DME:[facility].  [facility] RN and PT  Patient currently on O2, message sent to [place] [facility] to confirm patient can return at discharge, and inquire if O2 is acute  Patient active with [facility], [place] [facility] notified of admission - Legal Guardian [name] Dss - called VM left to update    Expected Discharge Plan: Assisted Living Barriers to Discharge: Continued Medical Work up   Patient Goals and CMS Choice        Expected Discharge Plan and Services Expected Discharge Plan: Assisted Living       Living arrangements for the past 2 months: Assisted Living Facility                                      Prior Living Arrangements/Services Living arrangements for the past 2 months: Assisted Living Facility Lives with:: Facility Resident              Current home services: DME    Activities of Daily Living Home Assistive Devices/Equipment: None ADL Screening (condition at time of admission) Is the patient deaf or have difficulty hearing?: Yes Does the patient have difficulty seeing, even when wearing glasses/contacts?: No Does the patient have difficulty concentrating, remembering, or making decisions?: Yes Does the patient have difficulty dressing or bathing?: No Does the patient have difficulty walking or climbing stairs?: Yes  Permission Sought/Granted                  Emotional Assessment              Admission diagnosis:  Generalized abdominal pain [R10.84] COPD exacerbation (HCC) [J44.1] Chest pain, unspecified  type [R07.9] Patient Active Problem List   Diagnosis Date Noted   Chest pain    COPD exacerbation (HCC) 03/14/2022   Seizure disorder (HCC) 03/14/2022   Dyspnea    Acute exacerbation of CHF (congestive heart failure) (HCC) 02/15/2022   Anxiety 02/15/2022   Tobacco use 02/15/2022   Elevated partial thromboplastin time (PTT) 02/15/2022   Abdominal pain 02/15/2022   HFrEF (heart failure with reduced ejection fraction) (HCC)    Weakness    COPD with acute exacerbation (HCC) 05/23/2021   Paroxysmal atrial fibrillation (HCC) 05/23/2021   Chronic anticoagulation 05/23/2021   Abdominal aortic aneurysm (AAA) 35 to 39 mm in diameter (HCC) 05/23/2021   Ischemic cardiomyopathy 05/22/2020   Chronic systolic heart failure (HCC) 05/22/2020   HCAP (healthcare-associated pneumonia) 01/24/2020   Hyperkalemia 01/24/2020   Acute on chronic combined systolic (congestive) and diastolic (congestive) heart failure (HCC) 01/24/2020   Seizure (HCC) 01/24/2020   HLD (hyperlipidemia) 01/24/2020   Ascites due to alcoholic cirrhosis (HCC)    Acute on chronic systolic CHF (congestive heart failure) (HCC) 11/18/2019   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/18/2019   Elevated troponin 11/18/2019   Hypoxia 11/18/2019   Acute renal failure superimposed on stage 3a chronic kidney disease (HCC) 11/18/2019   Coronary artery disease 05/15/2019  Essential hypertension 05/15/2019   History of stroke 05/15/2019   Homicidal ideation 05/15/2019   Hypothyroidism 05/15/2019   Constipation 05/06/2019   Scrotal mass 05/06/2019   Bipolar 1 disorder (HCC) 12/23/2018   Acute on chronic respiratory failure with hypoxia (HCC) 11/03/2018   Chronic pain of left knee 09/17/2018   Atypical chest pain 10/11/2017   Polypharmacy 08/05/2017   Cerebrovascular accident (CVA) due to stenosis of left middle cerebral artery (HCC) 06/26/2017   Urinary retention due to benign prostatic hyperplasia 06/22/2017   Falls  06/20/2017   Risk for falls 02/19/2017   Dyslipidemia 11/19/2016   Peripheral artery disease (HCC) 08/28/2016   Claudication (HCC) 08/09/2016   History of alcohol abuse 08/09/2016   History of drug dependence/abuse (HCC) 08/09/2016   Severe single current episode of major depressive disorder, without psychotic features (HCC) 08/09/2016   Mitral insufficiency 01/04/2016   Prediabetes 12/29/2015   Fracture of left clavicle 07/13/2012   PCP:  [facility] Pharm[facility]:   [facility] - [place], [place] - [address] [place] [address] Phone: [phone] Fax: [phone]     Social Determinants of Health (SDOH) Interventions    Readmission Risk Interventions     No data to display

## 2022-03-15 NOTE — Plan of Care (Signed)

## 2022-03-16 DIAGNOSIS — J441 Chronic obstructive pulmonary disease with (acute) exacerbation: Secondary | ICD-10-CM | POA: Diagnosis not present

## 2022-03-16 MED ORDER — INFLUENZA VAC SPLIT QUAD 0.5 ML IM SUSY
0.5000 mL | PREFILLED_SYRINGE | INTRAMUSCULAR | Status: AC
  Administered 2022-03-20: 0.5 mL via INTRAMUSCULAR
  Filled 2022-03-16: qty 0.5

## 2022-03-16 MED ORDER — METHYLPREDNISOLONE SODIUM SUCC 125 MG IJ SOLR
60.0000 mg | Freq: Every day | INTRAMUSCULAR | Status: DC
  Administered 2022-03-16 – 2022-03-17 (×2): 60 mg via INTRAVENOUS
  Filled 2022-03-16 (×2): qty 2

## 2022-03-16 MED ORDER — METHOCARBAMOL 500 MG PO TABS
500.0000 mg | ORAL_TABLET | Freq: Three times a day (TID) | ORAL | Status: DC
  Administered 2022-03-16 – 2022-03-21 (×16): 500 mg via ORAL
  Filled 2022-03-16 (×16): qty 1

## 2022-03-16 MED ORDER — PNEUMOCOCCAL 20-VAL CONJ VACC 0.5 ML IM SUSY
0.5000 mL | PREFILLED_SYRINGE | INTRAMUSCULAR | Status: AC
  Administered 2022-03-20: 0.5 mL via INTRAMUSCULAR
  Filled 2022-03-16: qty 0.5

## 2022-03-16 NOTE — Progress Notes (Signed)
PROGRESS NOTE    Carl [name]  CBJ:[id number] DOB: [birth year] DOA: 03/14/2022 PCP: [facility]    Brief Narrative:  y.o. male with medical history significant for bipolar disorder, CHF, COPD, depression, hypertension, coronary artery disease s/p NSTEMI, stroke and seizure disorder, who presented to the ER with acute onset of epigastric abdominal and left-sided chest pain when he was sitting outside in a wheelchair today per EMS.  He was fairly confused in the ER was experiencing flight of ideas was recently evaluated for SI/HI ER, sent to [place] then to [place] [facility].  He has been having dyspnea with associated wheezing as well as a cough with inability to expectorate.  No fever or chills.  No nausea or vomiting or abdominal pain.  No dysuria, oliguria or hematuria or flank pain reported.   On 02/16/2022 the patient had a 2D echo with EF of 25 to 30% with indeterminate left ventricular diastolic parameters, severe left atrial dilatation and mild right atrial dilatation with mild mitral valve regurgitation and mild aortic regurgitation.  Per outpatient cardiology notes patient has AICD in place  Current presentation is more consistent with decompensated COPD.  Cardiology consult on admission has been deferred at this time.   Assessment & Plan:   Principal Problem:   COPD exacerbation (HCC) Active Problems:   Chest pain   Bipolar 1 disorder (HCC)   Essential hypertension   Hypothyroidism   Dyslipidemia   Paroxysmal atrial fibrillation (HCC)   Seizure disorder (HCC)  Acute exacerbation of COPD Patient presented with cough, inability to clear secretions, audible wheeze Presentation consistent with acute decompensated COPD Patient on 2 L nasal cannula Plan: Continue IV Solu-Medrol 60 mg daily Every 4-hour DuoNeb Twice daily Brovana Twice daily Pulmicort Oxygen as needed  Elevated troponin Atypical chest pain Likely demand ischemia Pain now resolved No  indication of ACS DC telemetry  Chronic systolic congestive heart failure Recent EF 25 to 30% No signs of acute decompensation Continue GDMT  Seizure disorder PTA Keppra  Paroxysmal atrial fibrillation Controlled Continue PTA Eliquis and Coreg  Hyperlipidemia PTA statin  Hypothyroidism PTA Synthroid  Essential hypertension BP controlled Continue current regimen per home doses  Bipolar 1 disorder Stable Depakote and Seroquel    DVT prophylaxis: Eliquis Code Status: Full Family Communication: None today Disposition Plan: Status is: Inpatient Remains inpatient appropriate because: Acute exacerbation of COPD on IV steroids and aggressive nebulizer regimen   Level of care: Telemetry Medical  Consultants:  None  Procedures:  None  Antimicrobials: None   Subjective: Seen and examined.  Still wheezing.  Chest pain improved  Objective: Vitals:   03/15/22 2023 03/16/22 0523 03/16/22 0739 03/16/22 0754  BP:  122/83 95/67   Pulse:  82 81   Resp:  20 18   Temp:  97.7 F (36.5 C)    TempSrc:  Oral    SpO2: 95% 99% 97% 98%  Weight:      Height:        Intake/Output Summary (Last 24 hours) at 03/16/2022 1329 Last data filed at 03/16/2022 1019 Gross per 24 hour  Intake 240 ml  Output 750 ml  Net -510 ml   Filed Weights   03/14/22 1838 03/15/22 0041  Weight: 87 kg 85.5 kg    Examination:  General exam: NAD Respiratory system: Scattered end expiratory wheeze.  Normal work of breathing.  2 L Cardiovascular system: Distant heart sounds, S1-S2, no appreciable murmurs Gastrointestinal system: Soft, NT/ND, normal bowel sounds Central nervous system: Alert  and oriented. No focal neurological deficits. Extremities: Symmetric 5 x 5 power. Skin: No rashes, lesions or ulcers Psychiatry: Judgement and insight appear normal. Mood & affect appropriate.     Data Reviewed: I have personally reviewed following labs and imaging studies  CBC: Recent Labs  Lab  03/14/22 1638 03/15/22 0511  WBC 8.6 5.1  HGB 12.2* 11.8*  HCT 37.7* 35.6*  MCV 89.1 88.1  PLT 212 207   Basic Metabolic Panel: Recent Labs  Lab 03/14/22 1638 03/15/22 0511  NA 138 138  K 4.4 4.4  CL 107 108  CO2 21* 21*  GLUCOSE 118* 280*  BUN 18 17  CREATININE 1.28* 1.10  CALCIUM 9.6 9.3   GFR: Estimated Creatinine Clearance: 74.5 mL/min (by C-G formula based on SCr of 1.1 mg/dL). Liver Function Tests: Recent Labs  Lab 03/14/22 1638  AST 40  ALT 42  ALKPHOS 82  BILITOT 0.6  PROT 6.9  ALBUMIN 3.3*   Recent Labs  Lab 03/14/22 1638  LIPASE 40   Recent Labs  Lab 03/14/22 1930  AMMONIA <10   Coagulation Profile: No results for input(s): "INR", "PROTIME" in the last 168 hours. Cardiac Enzymes: No results for input(s): "CKTOTAL", "CKMB", "CKMBINDEX", "TROPONINI" in the last 168 hours. BNP (last 3 results) No results for input(s): "PROBNP" in the last 8760 hours. HbA1C: No results for input(s): "HGBA1C" in the last 72 hours. CBG: No results for input(s): "GLUCAP" in the last 168 hours. Lipid Profile: No results for input(s): "CHOL", "HDL", "LDLCALC", "TRIG", "CHOLHDL", "LDLDIRECT" in the last 72 hours. Thyroid Function Tests: No results for input(s): "TSH", "T4TOTAL", "FREET4", "T3FREE", "THYROIDAB" in the last 72 hours. Anemia Panel: No results for input(s): "VITAMINB12", "FOLATE", "FERRITIN", "TIBC", "IRON", "RETICCTPCT" in the last 72 hours. Sepsis Labs: No results for input(s): "PROCALCITON", "LATICACIDVEN" in the last 168 hours.  Recent Results (from the past 240 hour(s))  Resp Panel by RT-PCR (Flu A&B, Covid) Anterior Nasal Swab     Status: None   Collection Time: 03/14/22 11:58 PM   Specimen: Anterior Nasal Swab  Result Value Ref Range Status   SARS Coronavirus 2 by RT PCR NEGATIVE NEGATIVE Final    Comment: (NOTE) SARS-CoV-2 target nucleic acids are NOT DETECTED.  The SARS-CoV-2 RNA is generally detectable in upper respiratory specimens  during the acute phase of infection. The lowest concentration of SARS-CoV-2 viral copies this assay can detect is 138 copies/mL. A negative result does not preclude SARS-Cov-2 infection and should not be used as the sole basis for treatment or other patient management decisions. A negative result may occur with  improper specimen collection/handling, submission of specimen other than nasopharyngeal swab, presence of viral mutation(s) within the areas targeted by this assay, and inadequate number of viral copies(<138 copies/mL). A negative result must be combined with clinical observations, patient history, and epidemiological information. The expected result is Negative.  Fact Sheet for Patients:  BloggerCourse.com  Fact Sheet for Healthcare Providers:  SeriousBroker.it  This test is no t yet approved or cleared by the [place] FDA and  has been authorized for detection and/or diagnosis of SARS-CoV-2 by FDA under an Emergency Use Authorization (EUA). This EUA will remain  in effect (meaning this test can be used) for the duration of the COVID-19 declaration under Section 564(b)(1) of the Act, 21 U.S.C.section 360bbb-3(b)(1), unless the authorization is terminated  or revoked sooner.       Influenza A by PCR NEGATIVE NEGATIVE Final   Influenza B by PCR NEGATIVE NEGATIVE  Final    Comment: (NOTE) The Xpert Xpress SARS-CoV-2/FLU/RSV plus assay is intended as an aid in the diagnosis of influenza from Nasopharyngeal swab specimens and should not be used as a sole basis for treatment. Nasal washings and aspirates are unacceptable for Xpert Xpress SARS-CoV-2/FLU/RSV testing.  Fact Sheet for Patients: BloggerCourse.com  Fact Sheet for Healthcare Providers: SeriousBroker.it  This test is not yet approved or cleared by the [place] FDA and has been authorized for detection  and/or diagnosis of SARS-CoV-2 by FDA under an Emergency Use Authorization (EUA). This EUA will remain in effect (meaning this test can be used) for the duration of the COVID-19 declaration under Section 564(b)(1) of the Act, 21 U.S.C. section 360bbb-3(b)(1), unless the authorization is terminated or revoked.  Performed at [facility], [address] [place], [place] [address]          Radiology Studies: CT Abdomen Pelvis Wo Contrast  Result Date: 03/14/2022 CLINICAL DATA:  Abdominal pain. EXAM: CT ABDOMEN AND PELVIS WITHOUT CONTRAST TECHNIQUE: Multidetector CT imaging of the abdomen and pelvis was performed following the standard protocol without IV contrast. RADIATION DOSE REDUCTION: This exam was performed according to the departmental dose-optimization program which includes automated exposure control, adjustment of the mA and/or kV according to patient size and/or use of iterative reconstruction technique. COMPARISON:  Abdominal ultrasound dated 02/16/2022. FINDINGS: Evaluation of this exam is limited in the absence of intravenous contrast. Lower chest: The visualized lung bases are clear. There is cardiomegaly. Mechanical mitral bowel. There is coronary vascular calcification. Cardiac pacemaker wires noted. No intra-abdominal free air or free fluid. Hepatobiliary: No focal liver abnormality is seen. No gallstones, gallbladder wall thickening, or biliary dilatation. Pancreas: Unremarkable. No pancreatic ductal dilatation or surrounding inflammatory changes. Spleen: Normal in size without focal abnormality. Adrenals/Urinary Tract: The adrenal glands are unremarkable. Probable punctate nonobstructing right renal calculi. No hydronephrosis. There is no hydronephrosis or nephrolithiasis on the left. There is a 1.3 cm partially exophytic hypodense lesion from the posterior upper pole of the left kidney which is not characterized on this noncontrast CT but demonstrates fluid attenuation,  likely a cyst. The visualized ureters and urinary bladder appear unremarkable. Stomach/Bowel: There is no bowel obstruction or active inflammation. Several scattered colonic diverticula without active inflammatory changes. The appendix is normal. Vascular/Lymphatic: Advanced aortoiliac atherosclerotic disease. There is a 3.1 cm infrarenal abdominal aortic aneurysm. The IVC is unremarkable. No portal venous gas. There is no adenopathy. Reproductive: The prostate and seminal vesicles are grossly unremarkable. No pelvic mass. Other: None Musculoskeletal: No acute or significant osseous findings. IMPRESSION: 1. No acute intra-abdominal or pelvic pathology. 2. Colonic diverticulosis. No bowel obstruction. Normal appendix. 3. A 3.1 cm infrarenal abdominal aortic aneurysm. Recommend follow-up ultrasound every 3 years. This recommendation follows ACR consensus guidelines: White Paper of the ACR Incidental Findings Committee II on Vascular Findings. J Am Coll Radiol 2013; 30:160-109. 4. Aortic Atherosclerosis (ICD10-I70.0). Electronically Signed   By: Elgie Collard M.D.   On: 03/14/2022 21:19   CT Head Wo Contrast  Result Date: 03/14/2022 CLINICAL DATA:  Mental status change, unknown cause EXAM: CT HEAD WITHOUT CONTRAST TECHNIQUE: Contiguous axial images were obtained from the base of the skull through the vertex without intravenous contrast. RADIATION DOSE REDUCTION: This exam was performed according to the departmental dose-optimization program which includes automated exposure control, adjustment of the mA and/or kV according to patient size and/or use of iterative reconstruction technique. COMPARISON:  CT head 05/22/2021 BRAIN: BRAIN Patchy and confluent areas  of decreased attenuation are noted throughout the deep and periventricular white matter of the cerebral hemispheres bilaterally, compatible with chronic microvascular ischemic disease. Redemonstration of left parieto-occipital and temporal encephalomalacia  consistent with prior left middle cerebral artery infarction. No evidence of large-territorial acute infarction. No parenchymal hemorrhage. No mass lesion. No extra-axial collection. No mass effect or midline shift. No hydrocephalus. Basilar cisterns are patent. Vascular: No hyperdense vessel. Atherosclerotic calcifications are present within the cavernous internal carotid arteries. Skull: No acute fracture or focal lesion. Sinuses/Orbits: Complete opacification of the left maxillary sinus. Paranasal sinuses and mastoid air cells are clear. The orbits are unremarkable. Other: None. IMPRESSION: No acute intracranial abnormality. Electronically Signed   By: Tish Frederickson M.D.   On: 03/14/2022 19:18   DG Chest 2 View  Result Date: 03/14/2022 CLINICAL DATA:  Shortness of breath. EXAM: CHEST - 2 VIEW COMPARISON:  02/15/2022 radiograph and CT FINDINGS: Stable cardiomegaly post median sternotomy. Prosthetic cardiac valve. Left-sided pacemaker in place. Mild chronic peribronchial thickening without focal airspace disease. No pleural effusion or pneumothorax. Left clavicle hardware. IMPRESSION: Stable cardiomegaly post median sternotomy and cardiac valve replacement. No acute chest finding. Electronically Signed   By: Narda Rutherford M.D.   On: 03/14/2022 19:13        Scheduled Meds:  apixaban  5 mg Oral BID   arformoterol  15 mcg Nebulization BID   atorvastatin  40 mg Oral Daily   budesonide (PULMICORT) nebulizer solution  0.25 mg Nebulization BID   carvedilol  3.125 mg Oral BID WC   divalproex  500 mg Oral BID   escitalopram  10 mg Oral QHS   feeding supplement  237 mL Oral BID BM   furosemide  40 mg Oral Q breakfast   guaiFENesin  600 mg Oral BID   hydrOXYzine  10 mg Oral BID   levETIRAcetam  250 mg Oral BID   levothyroxine  25 mcg Oral Q0600   methocarbamol  500 mg Oral TID   methylPREDNISolone (SOLU-MEDROL) injection  60 mg Intravenous Daily   pantoprazole  40 mg Oral Daily   QUEtiapine  200  mg Oral QHS   Continuous Infusions:   LOS: 2 days     Tresa Moore, MD [facility]   If 7PM-7AM, please contact night-coverage  03/16/2022, 1:29 PM

## 2022-03-16 NOTE — Evaluation (Signed)
Occupational Therapy Evaluation Patient Details Name: Carl [name] MRN: [id number] DOB: 03-09-1961 Today's Date: 03/16/2022   History of Present Illness Carl [name] is a 61 y.o. male CHF, CAD, hypertension, CVA, renal failure, cirrhosis, polysubstance abuse, COPD, SI/HI and as listed in EMR presents to the emergency department for evaluation of chest and abdominal pain after sitting outside in a wheelchair today per EMS. Patient is either confused or experiencing flight of ideas. Recent evaluation here for SI/HI. Sent to [facility], then to [facility].   Clinical Impression   Patient presenting with decreased independence in self-care, functional mobility, safety, and endurance. Pt currently stays in a assisted living facility. Pt unable to provide PLOF due to decreased cognition. Pt required mod-max redirection throughout session due to tangential speech. Patient currently functioning min guard/ supervision for transfers sit<>stand and bed mobility. Pt was able to ambulate 229ft around nursing station with rolling walker and gait belt required min guard/supervision. Patient wheezing, O2 saturation  monitored throughout session and was 97% and above. Patient will benefit from acute OT to increase overall independence in the areas of ADLs, functional mobility, in order to safely discharge home.       Recommendations for follow up therapy are one component of a multi-disciplinary discharge planning process, led by the attending physician.  Recommendations may be updated based on patient status, additional functional criteria and insurance authorization.   Follow Up Recommendations  Home health OT    Assistance Recommended at Discharge Frequent or constant Supervision/Assistance  Patient can return home with the following A little help with walking and/or transfers;A little help with bathing/dressing/bathroom;Assist for transportation;Assistance with cooking/housework;Direct supervision/assist for  financial management;Direct supervision/assist for medications management    Functional Status Assessment  Patient has had a recent decline in their functional status and/or demonstrates limited ability to make significant improvements in function in a reasonable and predictable amount of time  Equipment Recommendations  None recommended by OT       Precautions / Restrictions Precautions Precautions: Fall Restrictions Weight Bearing Restrictions: No      Mobility Bed Mobility Overal bed mobility: Needs Assistance Bed Mobility: Sit to Supine, Supine to Sit     Supine to sit: Supervision, Min guard Sit to supine: Min guard, Supervision        Transfers Overall transfer level: Needs assistance Equipment used: None Transfers: Sit to/from Stand, Bed to chair/wheelchair/BSC Sit to Stand: Supervision, Min guard                  Balance Overall balance assessment: Needs assistance   Sitting balance-Leahy Scale: Good       Standing balance-Leahy Scale: Fair                             ADL either performed or assessed with clinical judgement   ADL Overall ADL's : Needs assistance/impaired                                     Functional mobility during ADLs: Min guard;Supervision/safety        Pertinent Vitals/Pain Pain Assessment Pain Assessment: No/denies pain        Extremity/Trunk Assessment Upper Extremity Assessment Upper Extremity Assessment: Generalized weakness   Lower Extremity Assessment Lower Extremity Assessment: Generalized weakness       Communication Communication Communication: Expressive difficulties   Cognition Arousal/Alertness: Awake/alert  Overall Cognitive Status: History of cognitive impairments - at baseline Area of Impairment: Following commands, Safety/judgement, Awareness, Problem solving, Attention                   Current Attention Level: Alternating   Following Commands:  Follows one step commands inconsistently Safety/Judgement: Decreased awareness of safety, Decreased awareness of deficits Awareness: Intellectual Problem Solving: Slow processing, Requires verbal cues General Comments: Patient with tangential speech throughout session required mod-max redirection.                Home Living Family/patient expects to be discharged to:: Assisted living                                        Prior Functioning/Environment Prior Level of Function : Patient poor historian/Family not available             Mobility Comments: Pt reports ambulation with RW ADLs Comments: pt reports mod I for self care tasks        OT Problem List: Decreased strength;Decreased activity tolerance;Decreased cognition;Decreased safety awareness      OT Treatment/Interventions: Self-care/ADL training;Therapeutic exercise;Energy conservation;Therapeutic activities;Cognitive remediation/compensation    OT Goals(Current goals can be found in the care plan section) Acute Rehab OT Goals Patient Stated Goal: none stated OT Goal Formulation: With patient Time For Goal Achievement: 03/30/22 Potential to Achieve Goals: Fair ADL Goals Pt Will Perform Grooming: with modified independence Pt Will Perform Lower Body Bathing: with modified independence Pt Will Perform Lower Body Dressing: with modified independence Pt Will Transfer to Toilet: with modified independence Additional ADL Goal #1: Patient will complete a sustained attention task, in quiet enviornment, for 10 minutes in 3 consecutive sessions.  OT Frequency: Min 2X/week       AM-PAC OT "6 Clicks" Daily Activity     Outcome Measure Help from another person eating meals?: None Help from another person taking care of personal grooming?: A Little Help from another person toileting, which includes using toliet, bedpan, or urinal?: A Little Help from another person bathing (including washing, rinsing,  drying)?: A Little Help from another person to put on and taking off regular upper body clothing?: None Help from another person to put on and taking off regular lower body clothing?: A Little 6 Click Score: 20   End of Session Equipment Utilized During Treatment: Gait belt;Rolling walker (2 wheels)  Activity Tolerance: Patient tolerated treatment well Patient left: in bed;with call bell/phone within reach;with bed alarm set  OT Visit Diagnosis: Unsteadiness on feet (R26.81);Muscle weakness (generalized) (M62.81);Other abnormalities of gait and mobility (R26.89);Other symptoms and signs involving cognitive function                Time: 1345-1406 OT Time Calculation (min): 21 min Charges:  OT General Charges $OT Visit: 1 Visit OT Evaluation $OT Eval Low Complexity: 1 Low OT Treatments $Therapeutic Activity: 8-22 mins    Kyndal Heringer, OTS 03/16/2022, 2:46 PM

## 2022-03-17 ENCOUNTER — Encounter: Payer: Self-pay | Admitting: Family Medicine

## 2022-03-17 DIAGNOSIS — J441 Chronic obstructive pulmonary disease with (acute) exacerbation: Secondary | ICD-10-CM | POA: Diagnosis not present

## 2022-03-17 MED ORDER — METHYLPREDNISOLONE SODIUM SUCC 125 MG IJ SOLR: 120.0000 mg | INTRAMUSCULAR | Status: DC

## 2022-03-17 NOTE — Evaluation (Signed)
Physical Therapy Evaluation Patient Details Name: Carl [name] MRN: [id number] DOB: 03/13/1961 Today's Date: 03/17/2022  History of Present Illness  Pt is a 61 y/o M admitted on 03/14/22 after presenting to the ER with acute onset of epigastric abdominal & L sided chest pain. Pt is being treated for acute exacerbation of COPD. PMH: bipolar disorder, CHF, COPD, depression, HTN, CAD s/p NSTEMI, stroke, seizure disorder  Clinical Impression  Pt seen for PT evaluation with pt agreeable. Pt follows simple commands throughout session but is only oriented to self. Pt is able to complete bed mobility & STS from various surfaces with mod I. Pt ambulates in room & bathroom with RW & supervision with fair management of AD in small spaces. After rest break, pt ambulates 1 lap around nurses station without AD & CGA<>Min assist 2/2 impaired balance & pt requiring intermittent standing rest breaks & cuing to not hold to rail/counters. Anticipate pt can d/c home with HHPT & will continue to follow pt acutely to address balance, endurance, and gait with LRAD.        Recommendations for follow up therapy are one component of a multi-disciplinary discharge planning process, led by the attending physician.  Recommendations may be updated based on patient status, additional functional criteria and insurance authorization.  Follow Up Recommendations Home health PT      Assistance Recommended at Discharge Intermittent Supervision/Assistance  Patient can return home with the following  A little help with walking and/or transfers;A little help with bathing/dressing/bathroom;Assist for transportation;Assistance with cooking/housework;Help with stairs or ramp for entrance;Direct supervision/assist for medications management;Direct supervision/assist for financial management    Equipment Recommendations Rolling walker (2 wheels)  Recommendations for Other Services       Functional Status Assessment Patient has had a  recent decline in their functional status and demonstrates the ability to make significant improvements in function in a reasonable and predictable amount of time.     Precautions / Restrictions Precautions Precautions: Fall Restrictions Weight Bearing Restrictions: No      Mobility  Bed Mobility Overal bed mobility: Modified Independent       Supine to sit: Modified independent (Device/Increase time), HOB elevated Sit to supine: Modified independent (Device/Increase time), HOB elevated        Transfers Overall transfer level: Modified independent   Transfers: Sit to/from Stand Sit to Stand: Modified independent (Device/Increase time)           General transfer comment: STS from EOB & toilet    Ambulation/Gait   Gait Distance (Feet):  (15 ft + 15 ft with RW & supervision, 180 ft without AD & min assist<>CGA) Assistive device: Rolling walker (2 wheels), None   Gait velocity: decreased        Stairs            Wheelchair Mobility    Modified Rankin (Stroke Patients Only)       Balance Overall balance assessment: Needs assistance   Sitting balance-Leahy Scale: Good       Standing balance-Leahy Scale: Good Standing balance comment: pt able to bend over & pull underwear up from floor without physical assistance without LOB                             Pertinent Vitals/Pain Pain Assessment Pain Assessment: No/denies pain    Home Living Family/patient expects to be discharged to:: Assisted living  Additional Comments: Pt unable to describe, information taken from chart.    Prior Function Prior Level of Function : Patient poor historian/Family not available             Mobility Comments: Pt reports he was ambulatory but unable to recall if he used AD or not.       Hand Dominance   Dominant Hand: Right    Extremity/Trunk Assessment   Upper Extremity Assessment Upper Extremity Assessment:  Overall WFL for tasks assessed    Lower Extremity Assessment Lower Extremity Assessment: Generalized weakness;Overall WFL for tasks assessed       Communication   Communication:  (question difficulty hearing)  Cognition Arousal/Alertness: Awake/alert   Overall Cognitive Status: History of cognitive impairments - at baseline                                 General Comments: Pt able to follow commands throughout session but poor memory/orientation as pt only able to recall his name, unable to recall his birthday, current year, where he was living/home set up nor if he used AD prior to admission or not.        General Comments General comments (skin integrity, edema, etc.): Pt received on 4L/min, titrated down to 2L/min & SPO2 >90% throughout, pt with wheezing breathing & PT educates pt on pursed lip breathing technique    Exercises     Assessment/Plan    PT Assessment Patient needs continued PT services  PT Problem List Decreased strength;Decreased activity tolerance;Decreased balance;Decreased mobility;Decreased safety awareness       PT Treatment Interventions DME instruction;Therapeutic exercise;Balance training;Gait training;Stair training;Neuromuscular re-education;Functional mobility training;Patient/family education;Therapeutic activities    PT Goals (Current goals can be found in the Care Plan section)  Acute Rehab PT Goals Patient Stated Goal: get better PT Goal Formulation: With patient Time For Goal Achievement: 03/31/22 Potential to Achieve Goals: Good    Frequency Min 2X/week     Co-evaluation               AM-PAC PT "6 Clicks" Mobility  Outcome Measure Help needed turning from your back to your side while in a flat bed without using bedrails?: None Help needed moving from lying on your back to sitting on the side of a flat bed without using bedrails?: None Help needed moving to and from a bed to a chair (including a wheelchair)?: A  Little Help needed standing up from a chair using your arms (e.g., wheelchair or bedside chair)?: None Help needed to walk in hospital room?: A Little Help needed climbing 3-5 steps with a railing? : A Little 6 Click Score: 21    End of Session Equipment Utilized During Treatment: Oxygen Activity Tolerance: Patient tolerated treatment well Patient left: in bed;with call bell/phone within reach;with bed alarm set Nurse Communication: Mobility status (O2) PT Visit Diagnosis: Unsteadiness on feet (R26.81);Muscle weakness (generalized) (M62.81)    Time: 3428-7681 PT Time Calculation (min) (ACUTE ONLY): 18 min   Charges:   PT Evaluation $PT Eval Low Complexity: 1 Low PT Treatments $Therapeutic Activity: 8-22 mins        Aleda Grana, PT, DPT 03/17/22, 11:48 AM   Sandi Mariscal 03/17/2022, 11:47 AM

## 2022-03-17 NOTE — Progress Notes (Signed)
\ PROGRESS NOTE    Carl [name]  QAS:[id number] DOB: 1960-07-10 DOA: 03/14/2022 PCP: [facility]    Brief Narrative:  y.o. male with medical history significant for bipolar disorder, CHF, COPD, depression, hypertension, coronary artery disease s/p NSTEMI, stroke and seizure disorder, who presented to the ER with acute onset of epigastric abdominal and left-sided chest pain when he was sitting outside in a wheelchair today per EMS.  He was fairly confused in the ER was experiencing flight of ideas was recently evaluated for SI/HI ER, sent to [facility] then to [facility].  He has been having dyspnea with associated wheezing as well as a cough with inability to expectorate.  No fever or chills.  No nausea or vomiting or abdominal pain.  No dysuria, oliguria or hematuria or flank pain reported.   On 02/16/2022 the patient had a 2D echo with EF of 25 to 30% with indeterminate left ventricular diastolic parameters, severe left atrial dilatation and mild right atrial dilatation with mild mitral valve regurgitation and mild aortic regurgitation.  Per outpatient cardiology notes patient has AICD in place  Current presentation is more consistent with decompensated COPD.  Cardiology consult on admission has been deferred at this time.   Assessment & Plan:   Principal Problem:   COPD exacerbation (HCC) Active Problems:   Chest pain   Bipolar 1 disorder (HCC)   Essential hypertension   Hypothyroidism   Dyslipidemia   Paroxysmal atrial fibrillation (HCC)   Seizure disorder (HCC)  Acute exacerbation of COPD Patient presented with cough, inability to clear secretions, audible wheeze Presentation consistent with acute decompensated COPD Patient on 2 L nasal cannula Still wheezing and Plan: Solu-Medrol 60 mg IV twice daily Every 4-hour DuoNeb Twice daily Brovana Twice daily Pulmicort Oxygen as needed  Elevated troponin Atypical chest pain Likely demand ischemia Pain now  resolved No indication of ACS EKG as needed chest pain  Chronic systolic congestive heart failure Recent EF 25 to 30% No signs of acute decompensation Continue GDMT  Seizure disorder PTA Keppra  Paroxysmal atrial fibrillation Controlled Continue PTA Eliquis and Coreg  Hyperlipidemia PTA statin  Hypothyroidism PTA Synthroid  Essential hypertension BP controlled Continue current regimen per home doses  Bipolar 1 disorder Stable Depakote and Seroquel    DVT prophylaxis: Eliquis Code Status: Full Family Communication: None today Disposition Plan: Status is: Inpatient Remains inpatient appropriate because: Acute exacerbation of COPD on IV steroids and aggressive nebulizer regimen.  Anticipate 48 hours prior to medical readiness for discharge   Level of care: Telemetry Medical  Consultants:  None  Procedures:  None  Antimicrobials: None   Subjective: Seen and examined.  Continues to wheeze.  Slightly worse than prior.  Chest pain improved  Objective: Vitals:   03/16/22 2059 03/17/22 0506 03/17/22 0717 03/17/22 0820  BP: (!) 132/50 113/78  132/82  Pulse: 83 88  85  Resp: 20 20  (!) 22  Temp: (!) 97.5 F (36.4 C) 97.7 F (36.5 C)  97.6 F (36.4 C)  TempSrc: Oral Oral  Oral  SpO2: 99% 99% 99% 100%  Weight:      Height:        Intake/Output Summary (Last 24 hours) at 03/17/2022 1153 Last data filed at 03/17/2022 1056 Gross per 24 hour  Intake 1200 ml  Output 1230 ml  Net -30 ml   Filed Weights   03/14/22 1838 03/15/22 0041  Weight: 87 kg 85.5 kg    Examination:  General exam: No acute distress  Respiratory system: Diffuse end expiratory wheeze.  Normal work of breathing.  2 L Cardiovascular system: Distant heart sounds, S1-S2, no appreciable murmurs Gastrointestinal system: Soft, NT/ND, normal bowel sounds Central nervous system: Alert and oriented. No focal neurological deficits. Extremities: Symmetric 5 x 5 power. Skin: No rashes, lesions  or ulcers Psychiatry: Judgement and insight appear normal. Mood & affect appropriate.     Data Reviewed: I have personally reviewed following labs and imaging studies  CBC: Recent Labs  Lab 03/14/22 1638 03/15/22 0511  WBC 8.6 5.1  HGB 12.2* 11.8*  HCT 37.7* 35.6*  MCV 89.1 88.1  PLT 212 207   Basic Metabolic Panel: Recent Labs  Lab 03/14/22 1638 03/15/22 0511  NA 138 138  K 4.4 4.4  CL 107 108  CO2 21* 21*  GLUCOSE 118* 280*  BUN 18 17  CREATININE 1.28* 1.10  CALCIUM 9.6 9.3   GFR: Estimated Creatinine Clearance: 74.5 mL/min (by C-G formula based on SCr of 1.1 mg/dL). Liver Function Tests: Recent Labs  Lab 03/14/22 1638  AST 40  ALT 42  ALKPHOS 82  BILITOT 0.6  PROT 6.9  ALBUMIN 3.3*   Recent Labs  Lab 03/14/22 1638  LIPASE 40   Recent Labs  Lab 03/14/22 1930  AMMONIA <10   Coagulation Profile: No results for input(s): "INR", "PROTIME" in the last 168 hours. Cardiac Enzymes: No results for input(s): "CKTOTAL", "CKMB", "CKMBINDEX", "TROPONINI" in the last 168 hours. BNP (last 3 results) No results for input(s): "PROBNP" in the last 8760 hours. HbA1C: No results for input(s): "HGBA1C" in the last 72 hours. CBG: No results for input(s): "GLUCAP" in the last 168 hours. Lipid Profile: No results for input(s): "CHOL", "HDL", "LDLCALC", "TRIG", "CHOLHDL", "LDLDIRECT" in the last 72 hours. Thyroid Function Tests: No results for input(s): "TSH", "T4TOTAL", "FREET4", "T3FREE", "THYROIDAB" in the last 72 hours. Anemia Panel: No results for input(s): "VITAMINB12", "FOLATE", "FERRITIN", "TIBC", "IRON", "RETICCTPCT" in the last 72 hours. Sepsis Labs: No results for input(s): "PROCALCITON", "LATICACIDVEN" in the last 168 hours.  Recent Results (from the past 240 hour(s))  Resp Panel by RT-PCR (Flu A&B, Covid) Anterior Nasal Swab     Status: None   Collection Time: 03/14/22 11:58 PM   Specimen: Anterior Nasal Swab  Result Value Ref Range Status   SARS  Coronavirus 2 by RT PCR NEGATIVE NEGATIVE Final    Comment: (NOTE) SARS-CoV-2 target nucleic acids are NOT DETECTED.  The SARS-CoV-2 RNA is generally detectable in upper respiratory specimens during the acute phase of infection. The lowest concentration of SARS-CoV-2 viral copies this assay can detect is 138 copies/mL. A negative result does not preclude SARS-Cov-2 infection and should not be used as the sole basis for treatment or other patient management decisions. A negative result may occur with  improper specimen collection/handling, submission of specimen other than nasopharyngeal swab, presence of viral mutation(s) within the areas targeted by this assay, and inadequate number of viral copies(<138 copies/mL). A negative result must be combined with clinical observations, patient history, and epidemiological information. The expected result is Negative.  Fact Sheet for Patients:  BloggerCourse.com  Fact Sheet for Healthcare Providers:  SeriousBroker.it  This test is no t yet approved or cleared by the [place] FDA and  has been authorized for detection and/or diagnosis of SARS-CoV-2 by FDA under an Emergency Use Authorization (EUA). This EUA will remain  in effect (meaning this test can be used) for the duration of the COVID-19 declaration under Section 564(b)(1) of the  Act, 21 U.S.C.section 360bbb-3(b)(1), unless the authorization is terminated  or revoked sooner.       Influenza A by PCR NEGATIVE NEGATIVE Final   Influenza B by PCR NEGATIVE NEGATIVE Final    Comment: (NOTE) The Xpert Xpress SARS-CoV-2/FLU/RSV plus assay is intended as an aid in the diagnosis of influenza from Nasopharyngeal swab specimens and should not be used as a sole basis for treatment. Nasal washings and aspirates are unacceptable for Xpert Xpress SARS-CoV-2/FLU/RSV testing.  Fact Sheet for  Patients: EntrepreneurPulse.com.au  Fact Sheet for Healthcare Providers: IncredibleEmployment.be  This test is not yet approved or cleared by the [place] FDA and has been authorized for detection and/or diagnosis of SARS-CoV-2 by FDA under an Emergency Use Authorization (EUA). This EUA will remain in effect (meaning this test can be used) for the duration of the COVID-19 declaration under Section 564(b)(1) of the Act, 21 U.S.C. section 360bbb-3(b)(1), unless the authorization is terminated or revoked.  Performed at [facility], [address], [place], [place] [address]          Radiology Studies: No results found.      Scheduled Meds:  apixaban  5 mg Oral BID   arformoterol  15 mcg Nebulization BID   atorvastatin  40 mg Oral Daily   budesonide (PULMICORT) nebulizer solution  0.25 mg Nebulization BID   carvedilol  3.125 mg Oral BID WC   divalproex  500 mg Oral BID   escitalopram  10 mg Oral QHS   feeding supplement  237 mL Oral BID BM   furosemide  40 mg Oral Q breakfast   guaiFENesin  600 mg Oral BID   hydrOXYzine  10 mg Oral BID   influenza vac split quadrivalent PF  0.5 mL Intramuscular Tomorrow-1000   levETIRAcetam  250 mg Oral BID   levothyroxine  25 mcg Oral Q0600   methocarbamol  500 mg Oral TID   [START ON 03/18/2022] methylPREDNISolone (SOLU-MEDROL) injection  120 mg Intravenous Q24H   pantoprazole  40 mg Oral Daily   pneumococcal 20-valent conjugate vaccine  0.5 mL Intramuscular Tomorrow-1000   QUEtiapine  200 mg Oral QHS   Continuous Infusions:   LOS: 3 days     Sidney Ace, MD [facility]   If 7PM-7AM, please contact night-coverage  03/17/2022, 11:53 AM

## 2022-03-18 ENCOUNTER — Inpatient Hospital Stay: Payer: Medicaid Other

## 2022-03-18 ENCOUNTER — Telehealth (HOSPITAL_COMMUNITY): Payer: Self-pay | Admitting: Pharmacy Technician

## 2022-03-18 ENCOUNTER — Other Ambulatory Visit (HOSPITAL_COMMUNITY): Payer: Self-pay

## 2022-03-18 DIAGNOSIS — J441 Chronic obstructive pulmonary disease with (acute) exacerbation: Secondary | ICD-10-CM | POA: Diagnosis not present

## 2022-03-18 LAB — PROTIME-INR
INR: 1.2 (ref 0.8–1.2)
Prothrombin Time: 15.5 seconds — ABNORMAL HIGH (ref 11.4–15.2)

## 2022-03-18 LAB — BLOOD GAS, ARTERIAL
Acid-Base Excess: 4.4 mmol/L — ABNORMAL HIGH (ref 0.0–2.0)
Bicarbonate: 29.2 mmol/L — ABNORMAL HIGH (ref 20.0–28.0)
Delivery systems: POSITIVE
Expiratory PAP: 6 cmH2O
FIO2: 0.4 %
Inspiratory PAP: 12 cmH2O
O2 Saturation: 99.1 %
Patient temperature: 37
pCO2 arterial: 43 mmHg (ref 32–48)
pH, Arterial: 7.44 (ref 7.35–7.45)
pO2, Arterial: 134 mmHg — ABNORMAL HIGH (ref 83–108)

## 2022-03-18 LAB — CBC
HCT: 35 % — ABNORMAL LOW (ref 39.0–52.0)
Hemoglobin: 11.5 g/dL — ABNORMAL LOW (ref 13.0–17.0)
MCH: 28.8 pg (ref 26.0–34.0)
MCHC: 32.9 g/dL (ref 30.0–36.0)
MCV: 87.7 fL (ref 80.0–100.0)
Platelets: 272 10*3/uL (ref 150–400)
RBC: 3.99 MIL/uL — ABNORMAL LOW (ref 4.22–5.81)
RDW: 16.9 % — ABNORMAL HIGH (ref 11.5–15.5)
WBC: 13.1 10*3/uL — ABNORMAL HIGH (ref 4.0–10.5)
nRBC: 0.8 % — ABNORMAL HIGH (ref 0.0–0.2)

## 2022-03-18 LAB — BRAIN NATRIURETIC PEPTIDE: B Natriuretic Peptide: 500.5 pg/mL — ABNORMAL HIGH (ref 0.0–100.0)

## 2022-03-18 LAB — GLUCOSE, CAPILLARY: Glucose-Capillary: 187 mg/dL — ABNORMAL HIGH (ref 70–99)

## 2022-03-18 LAB — APTT: aPTT: 29 seconds (ref 24–36)

## 2022-03-18 LAB — TROPONIN I (HIGH SENSITIVITY)
Troponin I (High Sensitivity): 124 ng/L (ref <18)
Troponin I (High Sensitivity): 122 ng/L (ref <18)

## 2022-03-18 LAB — MRSA NEXT GEN BY PCR, NASAL: MRSA by PCR Next Gen: NOT DETECTED

## 2022-03-18 MED ORDER — HEPARIN (PORCINE) 25000 UT/250ML-% IV SOLN
1250.0000 [IU]/h | INTRAVENOUS | Status: DC
  Administered 2022-03-18: 1250 [IU]/h via INTRAVENOUS
  Filled 2022-03-18: qty 250

## 2022-03-18 MED ORDER — METHYLPREDNISOLONE SODIUM SUCC 125 MG IJ SOLR
125.0000 mg | Freq: Once | INTRAMUSCULAR | Status: AC
  Administered 2022-03-18: 125 mg via INTRAVENOUS
  Filled 2022-03-18: qty 2

## 2022-03-18 MED ORDER — FUROSEMIDE 10 MG/ML IJ SOLN
40.0000 mg | Freq: Once | INTRAMUSCULAR | Status: AC
  Administered 2022-03-18: 40 mg via INTRAVENOUS
  Filled 2022-03-18: qty 4

## 2022-03-18 MED ORDER — HEPARIN BOLUS VIA INFUSION
4000.0000 [IU] | Freq: Once | INTRAVENOUS | Status: AC
  Administered 2022-03-18: 4000 [IU] via INTRAVENOUS
  Filled 2022-03-18: qty 4000

## 2022-03-18 MED ORDER — IPRATROPIUM-ALBUTEROL 0.5-2.5 (3) MG/3ML IN SOLN
3.0000 mL | RESPIRATORY_TRACT | Status: DC
  Administered 2022-03-18 – 2022-03-19 (×5): 3 mL via RESPIRATORY_TRACT
  Filled 2022-03-18: qty 9
  Filled 2022-03-18 (×4): qty 3

## 2022-03-18 MED ORDER — CHLORHEXIDINE GLUCONATE CLOTH 2 % EX PADS
6.0000 | MEDICATED_PAD | Freq: Every day | CUTANEOUS | Status: DC
Start: 2022-03-18 — End: 2022-03-21
  Administered 2022-03-18 – 2022-03-21 (×2): 6 via TOPICAL

## 2022-03-18 MED ORDER — METHYLPREDNISOLONE SODIUM SUCC 125 MG IJ SOLR
INTRAMUSCULAR | Status: AC
  Filled 2022-03-18: qty 2

## 2022-03-18 MED ORDER — MORPHINE SULFATE (PF) 2 MG/ML IV SOLN
2.0000 mg | Freq: Once | INTRAVENOUS | Status: AC
  Administered 2022-03-18: 2 mg via INTRAVENOUS
  Filled 2022-03-18: qty 1

## 2022-03-18 MED ORDER — MORPHINE SULFATE (PF) 2 MG/ML IV SOLN: 2.0000 mg | INTRAVENOUS | Status: DC | PRN

## 2022-03-18 MED ORDER — ORAL CARE MOUTH RINSE: 15.0000 mL | OROMUCOSAL | Status: DC | PRN

## 2022-03-18 MED ORDER — SODIUM CHLORIDE 0.9 % IV SOLN
250.0000 mg | Freq: Two times a day (BID) | INTRAVENOUS | Status: DC
  Administered 2022-03-18 – 2022-03-21 (×6): 250 mg via INTRAVENOUS
  Filled 2022-03-18 (×6): qty 2.5

## 2022-03-18 NOTE — Significant Event (Signed)
Called by bedside RN.  Worsening WOB and resp distress noted on bedside eval.  Poor air movement, bilateral wheeze.  CXR with cardiomegaly and evidence of fluid overload  Plan: STAT BIPAP Transfer to SDU Intensivist consult Solumedrol 125 x 1 Lasix 40mg  IV x 1 Morphine 2mg  IV x 1  Patient with advanced COPD and advanced systolic HF EF 25-30.  High risk for clinical deterioration.  Will involve palliative care for GOC  Appreciate assistance of PCCM team Case discussed with PCCM Dr and NP  Belia Heman MD

## 2022-03-18 NOTE — Consult Note (Cosign Needed Addendum)
NAME:  Carl [name], MRN:  [id number], DOB:  [birth year], LOS: 4 ADMISSION DATE:  03/14/2022, CONSULTATION DATE: 03/18/22 REFERRING MD: Dr. Georgeann Oppenheim, CHIEF COMPLAINT: Shortness of Breath    History of Present Illness:  This is a 61 yo male who presented to [facility] ER via EMS on 09/7 from [facility] with c/o chest and abdominal pain.  Per EMS run sheet the initial notification was for pt c/o breathing problems after sitting outside in his wheelchair in the heat.  However, upon EMS arrival the pt did not exhibit signs of respiratory distress.  Pt transported to [facility] for further evaluation.  The pt was recently discharged from [facility] ER to [facility] on 03/06/22 due to suicidal and homicidal ideation requiring IVC.    Hospital Course  Upon arrival to the ER pt confused and tachypneic with wheezing.  CT Head negative for acute abnormality.  CXR negative for cardiopulmonary abnormality.   Lab results revealed CO2 21/glucose 118/creatinine 1.28/albumin 3.3/BNP 378.3/troponin 100/hgb 12.2.  Pt received iv solumedrol and duonebs x2.  Pt subsequently admitted to the medsurg unit for additional workup and treatment per hospitalist team.   CT Abd Pelvis 09/7: No acute intra-abdominal or pelvic pathology. Colonic diverticulosis. No bowel obstruction. Normal appendix. A 3.1 cm infrarenal abdominal aortic aneurysm. Recommend follow-up ultrasound every 3 years. This recommendation follows ACR consensus guidelines: White Paper of the ACR Incidental Findings Committee II on Vascular Findings. J Am Coll Radiol 2013; 10:789-794. Aortic Atherosclerosis (ICD10-I70.0).  See detailed hospital course listed under significant events   Pertinent  Medical History  Bipolar 1 Disorder CKD COPD Depression HTN CAD s/p CABG  NSTEMI RVAD Seizure Disorder on Keppra  Stroke  Tobacco Abuse  Paroxysmal Atrial Fibrillation  Hypothyroidism  Chronic Diastolic Systolic CHF  Abdominal Aortic Aneurysm    Significant Hospital Events: Including procedures, antibiotic start and stop dates in addition to other pertinent events   09/7: Pt admitted to the [facility] unit with acute on chronic respiratory failure secondary to AECOPD and atypical chest pain with elevated troponin 09/11: Pt developed worsening acute on chronic respiratory failure with use of accessory muscles to breath secondary to AECOPD requiring Bipap and initial transfer to the progressive care unit.  However, despite Bipap respiratory status tenuous pt transferred to the stepdown unit PCCM team consulted to assist with management   Interim History / Subjective:  Pt remains on Bipap settings 12/6 FiO2 40% pt with intermittent tachypnea with use of accessory muscles to breath.  Received 125 mg iv solumedrol/40 mg iv lasix/ 2 mg of iv morphine with slight improvement in respiratory status   Objective   Blood pressure 129/78, pulse (!) 132, temperature 97.7 F (36.5 C), temperature source Oral, resp. rate (!) 34, height 5\' 9"  (1.753 m), weight 85.5 kg, SpO2 99 %.        Intake/Output Summary (Last 24 hours) at 03/18/2022 1711 Last data filed at 03/18/2022 0900 Gross per 24 hour  Intake 240 ml  Output 1600 ml  Net -1360 ml   Filed Weights   03/14/22 1838 03/15/22 0041  Weight: 87 kg 85.5 kg    Examination: General: Acute on chronically-ill appearing male, in respiratory distress on Bipap  HENT: Supple, mild JVD  Lungs: Diffuse wheezing throughout, tachypneic with accessory muscle use  Cardiovascular: Dual paced with intermittent atrial fibrillation, 2+ radial/1+ distal pulses, no edema  Abdomen: Reproducible hernia present, hypoactive BS x4, soft, mild distension, non tender  Extremities: Moves all extremities, bilateral lower  extremities cool to touch  Neuro: Confused to place and situation, following commands, PERRLA GU: Deferred   Resolved Hospital Problem list     Assessment & Plan:  Acute on chronic respiratory  failure secondary to AECOPD Hx: Tobacco abuse  - Supplemental O2 or Bipap for dyspnea and/or hypoxia  - Maintain O2 sats 88 to 92% - Continue scheduled and prn bronchodilator therapy  - IV and nebulized steroids  - Smoking cessation counseling  - HIGH RISK FOR MECHANICAL INTUBATION   Acute on chronic diastolic systolic CHF Paroxysmal atrial fibrillation  Atypical chest pain  Elevated troponin's likely demand ischemia  Hx: HTN, Biventricular ICD, MI, and Stroke   Echo 02/15/22: EF 25 to 30% - Continuous telemetry monitoring  - Repeat troponin - Discontinue eliquis and start heparin gtt due to inability to tolerate po's  - Continue outpatient cardiac medications once able to tolerate po's  - Diurese to keep euvolemic   Hypothyroidism - Continue synthroid   Seizure disorder - Continue keppra   Bipolar 1 Disorder Depression - Continue depakote, and seroquel   Best Practice (right click and "Reselect all SmartList Selections" daily)   Diet/type: NPO DVT prophylaxis: systemic heparin GI prophylaxis: PPI Lines: N/A Foley:  N/A Code Status:  full code Last date of multidisciplinary goals of care discussion [N/A]  Pt is a ward of the state and has a legal guardian at [employer].  DSS contacted by ICU Intensivist Dr. Belia Heman to provide an update  Labs   CBC: Recent Labs  Lab 03/14/22 1638 03/15/22 0511 03/18/22 0501  WBC 8.6 5.1 13.1*  HGB 12.2* 11.8* 11.5*  HCT 37.7* 35.6* 35.0*  MCV 89.1 88.1 87.7  PLT 212 207 272    Basic Metabolic Panel: Recent Labs  Lab 03/14/22 1638 03/15/22 0511  NA 138 138  K 4.4 4.4  CL 107 108  CO2 21* 21*  GLUCOSE 118* 280*  BUN 18 17  CREATININE 1.28* 1.10  CALCIUM 9.6 9.3   GFR: Estimated Creatinine Clearance: 74.5 mL/min (by C-G formula based on SCr of 1.1 mg/dL). Recent Labs  Lab 03/14/22 1638 03/15/22 0511 03/18/22 0501  WBC 8.6 5.1 13.1*    Liver Function Tests: Recent Labs  Lab 03/14/22 1638  AST 40  ALT  42  ALKPHOS 82  BILITOT 0.6  PROT 6.9  ALBUMIN 3.3*   Recent Labs  Lab 03/14/22 1638  LIPASE 40   Recent Labs  Lab 03/14/22 1930  AMMONIA <10    ABG    Component Value Date/Time   PHART 7.44 03/18/2022 1603   PCO2ART 43 03/18/2022 1603   PO2ART 134 (H) 03/18/2022 1603   HCO3 29.2 (H) 03/18/2022 1603   ACIDBASEDEF 4.8 (H) 02/15/2022 0740   O2SAT 99.1 03/18/2022 1603     Coagulation Profile: No results for input(s): "INR", "PROTIME" in the last 168 hours.  Cardiac Enzymes: No results for input(s): "CKTOTAL", "CKMB", "CKMBINDEX", "TROPONINI" in the last 168 hours.  HbA1C: Hgb A1c MFr Bld  Date/Time Value Ref Range Status  11/03/2018 07:48 AM 5.6 4.8 - 5.6 % Final    Comment:    (NOTE) Pre diabetes:          5.7%-6.4% Diabetes:              >6.4% Glycemic control for   <7.0% adults with diabetes     CBG: No results for input(s): "GLUCAP" in the last 168 hours.  Review of Systems:   Unable to assess due to  respiratory status and requiring Bipap   Past Medical History:  He,  has a past medical history of Acute pulmonary edema (HCC) (2017), Acute respiratory failure with hypoxia (HCC) (2017), Bipolar 1 disorder (HCC), CHF (congestive heart failure) (HCC), Chronic kidney disease, COPD (chronic obstructive pulmonary disease) (HCC), Depression, Hypertension, Myocardial infarction 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   Surgical History:   Past Surgical History:  Procedure Laterality Date   BIV UPGRADE N/A 05/05/2020   Procedure: BIV ICD UPGRADE;  Surgeon: Duke Salvia, MD;  Location: [facility] INVASIVE CV LAB;  Service: Cardiovascular;  Laterality: N/A;   CORONARY ARTERY BYPASS GRAFT       Social History:   reports that he has been smoking cigarettes. He has been smoking an average of 0.25 packs per day. He has never used  smokeless tobacco. He reports that he does not currently use alcohol. He reports that he does not use drugs.   Family History:  His family history includes Cancer in his mother; Prostate cancer in his father.   Allergies Allergies  Allergen Reactions   Iodinated Contrast Media Shortness Of Breath   Penicillins Anaphylaxis and Shortness Of Breath    Respiratory  Tolerated cefuroxime on 01/06/16   Strawberry Extract Anaphylaxis   Cefepime Itching    Empiric antibiotic, developed pruritis.    Erythromycin Itching   Sulfa Antibiotics Itching, Nausea And Vomiting and Nausea Only     Home Medications  Prior to Admission medications   Medication Sig Start Date End Date Taking? Authorizing Provider  apixaban (ELIQUIS) 5 MG TABS tablet TAKE 1 TABLET BY MOUTH TWICE A DAY 01/18/21  Yes Duke Salvia, MD  atorvastatin (LIPITOR) 40 MG tablet Take 1 tablet (40 mg total) by mouth daily. 09/20/21  Yes Iran Ouch, MD  carvedilol (COREG) 3.125 MG tablet Take 3.125 mg by mouth 2 (two) times daily with a meal.   Yes [provider]  divalproex (DEPAKOTE ER) 500 MG 24 hr tablet Take 1 tablet (500 mg total) by mouth 2 (two) times daily. 05/24/19  Yes Clapacs, Jackquline Denmark, MD  furosemide (LASIX) 40 MG tablet Take 1 tablet (40 mg total) by mouth daily. 08/01/20  Yes Duke Salvia, MD  Hydrocortisone Acetate 1 % CREA SMARTSIG:sparingly Topical 3 Times Daily 12/24/21  Yes [provider]  hydrOXYzine (ATARAX) 10 MG tablet Take 10 mg by mouth 2 (two) times daily. 12/24/21  Yes [provider]  levETIRAcetam (KEPPRA) 250 MG tablet Take 1 tablet (250 mg total) by mouth 2 (two) times daily. 03/24/20  Yes Minna Antis, MD  levothyroxine (SYNTHROID) 25 MCG tablet Take 1 tablet (25 mcg total) by mouth daily. 05/24/19  Yes Clapacs, Jackquline Denmark, MD  lisinopril (ZESTRIL) 2.5 MG tablet TAKE 1 TABLET BY MOUTH ONCE DAILY 01/21/21  Yes Alver Sorrow, NP  omeprazole (PRILOSEC) 20 MG capsule Take  20 mg by mouth daily.   Yes [provider]  QUEtiapine (SEROQUEL) 200 MG tablet Take 200 mg by mouth at bedtime.   Yes [provider]  tiotropium (SPIRIVA) 18 MCG inhalation capsule Place 18 mcg into inhaler and inhale daily.   Yes [provider]  traMADol (ULTRAM) 50 MG tablet Take 50 mg by mouth 2 (two) times daily as needed. 01/16/22  Yes [provider]  acetaminophen (TYLENOL) 500 MG tablet Take 500 mg by mouth every 6 (six) hours as  needed for mild pain.    [provider]  albuterol (VENTOLIN HFA) 108 (90 Base) MCG/ACT inhaler Inhale 2 puffs into the lungs every 6 (six) hours as needed for wheezing. 05/24/19 04/28/22  Clapacs, Jackquline Denmark, MD  escitalopram (LEXAPRO) 10 MG tablet Take 1 tablet (10 mg total) by mouth at bedtime. 05/24/19   Clapacs, Jackquline Denmark, MD  feeding supplement (ENSURE ENLIVE / ENSURE PLUS) LIQD Take 237 mLs by mouth 2 (two) times daily between meals. 05/26/21   Alford Highland, MD  Fluticasone-Salmeterol (ADVAIR) 250-50 MCG/DOSE AEPB Inhale 1 puff into the lungs 2 (two) times daily. Patient not taking: Reported on 03/15/2022 05/26/21   Alford Highland, MD  predniSONE (DELTASONE) 10 MG tablet Take 10 mg by mouth daily with breakfast. Patient not taking: Reported on 03/15/2022    [provider]     Critical care time: 60 minutes      Zada Girt, AGNP  Pulmonary/Critical Care Pager [phone] (please enter 7 digits) PCCM Consult Pager [phone] (please enter 7 digits)

## 2022-03-18 NOTE — Consult Note (Signed)
ANTICOAGULATION CONSULT NOTE - Follow Up Consult  Pharmacy Consult for Eliquis to heparin gtt conversion Indication: atrial fibrillation  Allergies  Allergen Reactions   Iodinated Contrast Media Shortness Of Breath   Penicillins Anaphylaxis and Shortness Of Breath    Respiratory  Tolerated cefuroxime on 01/06/16   Strawberry Extract Anaphylaxis   Cefepime Itching    Empiric antibiotic, developed pruritis.    Erythromycin Itching   Sulfa Antibiotics Itching, Nausea And Vomiting and Nausea Only    Patient Measurements: Height: 5\' 9"  (175.3 cm) (per patient) Weight: 85.5 kg (188 lb 7.9 oz) IBW/kg (Calculated) : 70.7 Heparin Dosing Weight: 85.5kg  Vital Signs: Temp: 97.7 F (36.5 C) (09/11 0745) Temp Source: Oral (09/11 0745) BP: 129/78 (09/11 1508) Pulse Rate: 132 (09/11 1600)  Labs: Recent Labs    03/18/22 0501  HGB 11.5*  HCT 35.0*  PLT 272    Estimated Creatinine Clearance: 74.5 mL/min (by C-G formula based on SCr of 1.1 mg/dL).  Medications: No pertinent AC/APT related allergies. Heparin Dosing Weight: 85.5kg PTA: Eliquis 5mg  BID Inpatient: Eliquis 5mg  BID (last dose 0911 @0912 ) >> heparin gtt (9/11 1800>>  Assessment: 61 yo M w/ h/o HFrEF (EF 25-30%), CAD (s/p NSTEMI), h/o stroke, COPD, T1 BP disorder, HTN, hypothyroidism, HLD, parAFib (on eliquis), & seizure disorder admitted on 9/07 for COPDE a/w chest pain now resolved attributable to demand ischemia and c/b SI with psych consulted. Pharmacy consulted for the mgmt of heparin gtt.  Date Time aPTT/HL Rate/Comment       Baseline Labs: aPTT - pending INR - pending Hgb - 12.2>11.5 Plts - 212>272  Goal of Therapy:  Heparin level 0.3-0.7 units/ml aPTT 66-102 seconds Monitor platelets by anticoagulation protocol: Yes   Plan:  Eliquis 5mg  BID (last dose 0911 @0912 ). Give 4000 units bolus x1; then start heparin infusion at 1250 units/hr Check aPTT/Anti-Xa level in 6 hours and daily once consecutively  therapeutic.  Titrate by aPTT's until lab correlation is noted, then titrate by anti-xa alone. Continue to monitor H&H and platelets daily while on heparin gtt.  12-[birth year]-1992 Tabitha Riggins 03/18/2022,5:44 PM

## 2022-03-18 NOTE — Progress Notes (Signed)
   03/18/22 1508  Assess: MEWS Score  BP 129/78  MAP (mmHg) 95  Resp (!) 52  Level of Consciousness Alert  SpO2 98 %  O2 Device Aerosol Mask  Assess: MEWS Score  MEWS Temp 0  MEWS Systolic 0  MEWS Pulse 0  MEWS RR 3  MEWS LOC 0  MEWS Score 3  MEWS Score Color Yellow  Assess: if the MEWS score is Yellow or Red  Were vital signs taken at a resting state? Yes  Focused Assessment Change from prior assessment (see assessment flowsheet)  Does the patient meet 2 or more of the SIRS criteria? No  MEWS guidelines implemented *See Row Information* Yes  Treat  MEWS Interventions Escalated (See documentation below) (MD notified. Respiratory therapist notified. Pt in resp distress. New orders placed per MD.)  Pain Scale 0-10  Pain Score 0  Take Vital Signs  Increase Vital Sign Frequency  Yellow: Q 2hr X 2 then Q 4hr X 2, if remains yellow, continue Q 4hrs  Escalate  MEWS: Escalate Yellow: discuss with charge nurse/RN and consider discussing with provider and RRT  Notify: Charge Nurse/RN  Name of Charge Nurse/RN Notified [place], [place].  Date Charge Nurse/RN Notified 03/18/22  Notify: Provider  Provider Name/Title Dr. Georgeann Oppenheim  Date Provider Notified 03/18/22  Time Provider Notified 1509  Method of Notification Page  Notification Reason Change in status  Provider response See new orders;In department;En route (En route to room)  Date of Provider Response 03/18/22  Time of Provider Response 1509  Document  Patient Outcome Transferred/level of care increased  Progress note created (see row info) Yes  Assess: SIRS CRITERIA  SIRS Temperature  0  SIRS Pulse 1  SIRS Respirations  1  SIRS WBC 1  SIRS Score Sum  3

## 2022-03-18 NOTE — Progress Notes (Signed)
Patients Legal Guardian Leitha Bleak contacted the facility requesting an update on patients status and also responding to a physician calling the office requesting a DNR for the patient. Mr. Yin current status & current results explained to Mrs.Morrow in detail. She requested for the notes from the hospitalist be faxed over to her office and once reviewed they will release an consent for DNR on the patient. Charge Nurse Aundra Millet given the paperwork that was requested to fax over to [phone].

## 2022-03-18 NOTE — Telephone Encounter (Signed)
Pharmacy Patient Advocate Encounter  Insurance verification completed.    The patient is insured through [facility]   The patient is currently admitted and ran test claims for the following: Farxiga .  Copays and coinsurance results were relayed to Inpatient clinical team.

## 2022-03-18 NOTE — Progress Notes (Signed)
\ PROGRESS NOTE    Dessie Delcarlo  BLT:[id number] DOB: 1960-09-30 DOA: 03/14/2022 PCP: [facility]    Brief Narrative:  y.o. male with medical history significant for bipolar disorder, CHF, COPD, depression, hypertension, coronary artery disease s/p NSTEMI, stroke and seizure disorder, who presented to the ER with acute onset of epigastric abdominal and left-sided chest pain when he was sitting outside in a wheelchair today per EMS.  He was fairly confused in the ER was experiencing flight of ideas was recently evaluated for SI/HI ER, sent to [place] then to [facility].  He has been having dyspnea with associated wheezing as well as a cough with inability to expectorate.  No fever or chills.  No nausea or vomiting or abdominal pain.  No dysuria, oliguria or hematuria or flank pain reported.   On 02/16/2022 the patient had a 2D echo with EF of 25 to 30% with indeterminate left ventricular diastolic parameters, severe left atrial dilatation and mild right atrial dilatation with mild mitral valve regurgitation and mild aortic regurgitation.  Per outpatient cardiology notes patient has AICD in place  Current presentation is more consistent with decompensated COPD.  Cardiology consult on admission has been deferred at this time.   Assessment & Plan:   Principal Problem:   COPD exacerbation (HCC) Active Problems:   Chest pain   Bipolar 1 disorder (HCC)   Essential hypertension   Hypothyroidism   Dyslipidemia   Paroxysmal atrial fibrillation (HCC)   Seizure disorder (HCC)  Acute exacerbation of COPD Patient presented with cough, inability to clear secretions, audible wheeze Presentation consistent with acute decompensated COPD Patient on 2 L nasal cannula Still wheezing as of 9/11 Plan: Continue Solu-Medrol 60 mg IV twice daily Every 4-hour DuoNeb Twice daily Brovana Twice daily Pulmicort Oxygen as needed, currently on room air  Elevated troponin Atypical chest  pain Likely demand ischemia Pain now resolved No indication of ACS EKG as needed chest pain  Chronic systolic congestive heart failure Recent EF 25 to 30% No signs of acute decompensation Continue GDMT  Seizure disorder PTA Keppra  Paroxysmal atrial fibrillation Controlled Continue PTA Eliquis and Coreg  Hyperlipidemia PTA statin  Hypothyroidism PTA Synthroid  Essential hypertension BP controlled Continue current regimen per home doses  Bipolar 1 disorder Stable Depakote and Seroquel    DVT prophylaxis: Eliquis Code Status: Full Family Communication: None today Disposition Plan: Status is: Inpatient Remains inpatient appropriate because: Acute exacerbation of COPD on IV steroids and aggressive nebulizer regimen.  Anticipate 48 hours prior to medical readiness for discharge   Level of care: Telemetry Medical  Consultants:  None  Procedures:  None  Antimicrobials: None   Subjective: Patient seen and examined.  Continues to wheeze.  No appreciable change in respiratory status over interval.  Objective: Vitals:   03/18/22 0501 03/18/22 0745 03/18/22 0816 03/18/22 0821  BP: 108/78 118/70 111/81   Pulse: 92 98 96   Resp: 18 (!) 22 16 20   Temp: 97.8 F (36.6 C) 97.7 F (36.5 C)    TempSrc: Oral Oral    SpO2: 100% 100% 100%   Weight:      Height:        Intake/Output Summary (Last 24 hours) at 03/18/2022 1355 Last data filed at 03/18/2022 0900 Gross per 24 hour  Intake 360 ml  Output 1640 ml  Net -1280 ml   Filed Weights   03/14/22 1838 03/15/22 0041  Weight: 87 kg 85.5 kg    Examination:  General exam:  NAD Respiratory system: Diffuse end expiratory wheeze.  Normal work of breathing.  Room air Cardiovascular system: Distant heart sounds, S1-S2, no appreciable murmurs Gastrointestinal system: Soft, NT/ND, normal bowel sounds Central nervous system: Alert and oriented. No focal neurological deficits. Extremities: Symmetric 5 x 5  power. Skin: No rashes, lesions or ulcers Psychiatry: Judgement and insight appear normal. Mood & affect appropriate.     Data Reviewed: I have personally reviewed following labs and imaging studies  CBC: Recent Labs  Lab 03/14/22 1638 03/15/22 0511 03/18/22 0501  WBC 8.6 5.1 13.1*  HGB 12.2* 11.8* 11.5*  HCT 37.7* 35.6* 35.0*  MCV 89.1 88.1 87.7  PLT 212 207 272   Basic Metabolic Panel: Recent Labs  Lab 03/14/22 1638 03/15/22 0511  NA 138 138  K 4.4 4.4  CL 107 108  CO2 21* 21*  GLUCOSE 118* 280*  BUN 18 17  CREATININE 1.28* 1.10  CALCIUM 9.6 9.3   GFR: Estimated Creatinine Clearance: 74.5 mL/min (by C-G formula based on SCr of 1.1 mg/dL). Liver Function Tests: Recent Labs  Lab 03/14/22 1638  AST 40  ALT 42  ALKPHOS 82  BILITOT 0.6  PROT 6.9  ALBUMIN 3.3*   Recent Labs  Lab 03/14/22 1638  LIPASE 40   Recent Labs  Lab 03/14/22 1930  AMMONIA <10   Coagulation Profile: No results for input(s): "INR", "PROTIME" in the last 168 hours. Cardiac Enzymes: No results for input(s): "CKTOTAL", "CKMB", "CKMBINDEX", "TROPONINI" in the last 168 hours. BNP (last 3 results) No results for input(s): "PROBNP" in the last 8760 hours. HbA1C: No results for input(s): "HGBA1C" in the last 72 hours. CBG: No results for input(s): "GLUCAP" in the last 168 hours. Lipid Profile: No results for input(s): "CHOL", "HDL", "LDLCALC", "TRIG", "CHOLHDL", "LDLDIRECT" in the last 72 hours. Thyroid Function Tests: No results for input(s): "TSH", "T4TOTAL", "FREET4", "T3FREE", "THYROIDAB" in the last 72 hours. Anemia Panel: No results for input(s): "VITAMINB12", "FOLATE", "FERRITIN", "TIBC", "IRON", "RETICCTPCT" in the last 72 hours. Sepsis Labs: No results for input(s): "PROCALCITON", "LATICACIDVEN" in the last 168 hours.  Recent Results (from the past 240 hour(s))  Resp Panel by RT-PCR (Flu A&B, Covid) Anterior Nasal Swab     Status: None   Collection Time: 03/14/22 11:58 PM    Specimen: Anterior Nasal Swab  Result Value Ref Range Status   SARS Coronavirus 2 by RT PCR NEGATIVE NEGATIVE Final    Comment: (NOTE) SARS-CoV-2 target nucleic acids are NOT DETECTED.  The SARS-CoV-2 RNA is generally detectable in upper respiratory specimens during the acute phase of infection. The lowest concentration of SARS-CoV-2 viral copies this assay can detect is 138 copies/mL. A negative result does not preclude SARS-Cov-2 infection and should not be used as the sole basis for treatment or other patient management decisions. A negative result may occur with  improper specimen collection/handling, submission of specimen other than nasopharyngeal swab, presence of viral mutation(s) within the areas targeted by this assay, and inadequate number of viral copies(<138 copies/mL). A negative result must be combined with clinical observations, patient history, and epidemiological information. The expected result is Negative.  Fact Sheet for Patients:  BloggerCourse.com  Fact Sheet for Healthcare Providers:  SeriousBroker.it  This test is no t yet approved or cleared by the [place] FDA and  has been authorized for detection and/or diagnosis of SARS-CoV-2 by FDA under an Emergency Use Authorization (EUA). This EUA will remain  in effect (meaning this test can be used) for the duration of  the COVID-19 declaration under Section 564(b)(1) of the Act, 21 U.S.C.section 360bbb-3(b)(1), unless the authorization is terminated  or revoked sooner.       Influenza A by PCR NEGATIVE NEGATIVE Final   Influenza B by PCR NEGATIVE NEGATIVE Final    Comment: (NOTE) The Xpert Xpress SARS-CoV-2/FLU/RSV plus assay is intended as an aid in the diagnosis of influenza from Nasopharyngeal swab specimens and should not be used as a sole basis for treatment. Nasal washings and aspirates are unacceptable for Xpert Xpress  SARS-CoV-2/FLU/RSV testing.  Fact Sheet for Patients: BloggerCourse.com  Fact Sheet for Healthcare Providers: SeriousBroker.it  This test is not yet approved or cleared by the [place] FDA and has been authorized for detection and/or diagnosis of SARS-CoV-2 by FDA under an Emergency Use Authorization (EUA). This EUA will remain in effect (meaning this test can be used) for the duration of the COVID-19 declaration under Section 564(b)(1) of the Act, 21 U.S.C. section 360bbb-3(b)(1), unless the authorization is terminated or revoked.  Performed at [facility], [address] [place], [place] [address]          Radiology Studies: No results found.      Scheduled Meds:  apixaban  5 mg Oral BID   arformoterol  15 mcg Nebulization BID   atorvastatin  40 mg Oral Daily   budesonide (PULMICORT) nebulizer solution  0.25 mg Nebulization BID   carvedilol  3.125 mg Oral BID WC   divalproex  500 mg Oral BID   escitalopram  10 mg Oral QHS   feeding supplement  237 mL Oral BID BM   furosemide  40 mg Oral Q breakfast   guaiFENesin  600 mg Oral BID   hydrOXYzine  10 mg Oral BID   influenza vac split quadrivalent PF  0.5 mL Intramuscular Tomorrow-1000   ipratropium-albuterol  3 mL Nebulization Q4H   levETIRAcetam  250 mg Oral BID   levothyroxine  25 mcg Oral Q0600   methocarbamol  500 mg Oral TID   methylPREDNISolone (SOLU-MEDROL) injection  120 mg Intravenous Q24H   pantoprazole  40 mg Oral Daily   pneumococcal 20-valent conjugate vaccine  0.5 mL Intramuscular Tomorrow-1000   QUEtiapine  200 mg Oral QHS   Continuous Infusions:   LOS: 4 days     Tresa Moore, MD [facility]   If 7PM-7AM, please contact night-coverage  03/18/2022, 1:55 PM

## 2022-03-18 NOTE — Progress Notes (Signed)
Received from [facility] via bed accompanied by RN and RT; pt on bipap. Noted to have tachycardia, v-paced rhythm, rate 120's to 140 with PVC's, upon arrival and resp rate 30's -40. Awake and alert, with SOB and increased work of breathing but also reports that it is improved from earlier. Dr. Belia Heman and Annabelle Harman, NP in to see patient. Sats 100%. 12 lead EKG obtained. Lungs with wheezes throughout.

## 2022-03-18 NOTE — TOC Benefit Eligibility Note (Signed)
Patient Product/process development scientist completed.    The patient is currently admitted and upon discharge could be taking Farxiga 10 mg.  Requires Prior Authorization  The patient is insured through [facility] Medicaid     Roland Earl, CPhT Pharmacy Patient Advocate Specialist [facility] Patient Advocate Team Direct Number: [phone]  Fax: [phone]

## 2022-03-18 NOTE — Progress Notes (Signed)
Called to room by nurse tech,Zaralyn, reporting that pt could not breathe. Upon assessment, pt noted to be extremely short of breath with increased work of breathing and unable to finish a sentence. Pt was in respiratory distress.Respiratory notified for a breathing treatment. Dr. Georgeann Oppenheim also notified. MD headed to room to assess and new orders given. Orders implemented by this Clinical research associate. Pt placed on Bipap and later transferred to the ICU.

## 2022-03-19 DIAGNOSIS — F319 Bipolar disorder, unspecified: Secondary | ICD-10-CM | POA: Diagnosis not present

## 2022-03-19 DIAGNOSIS — R1084 Generalized abdominal pain: Secondary | ICD-10-CM

## 2022-03-19 DIAGNOSIS — R079 Chest pain, unspecified: Secondary | ICD-10-CM | POA: Diagnosis not present

## 2022-03-19 DIAGNOSIS — J441 Chronic obstructive pulmonary disease with (acute) exacerbation: Secondary | ICD-10-CM | POA: Diagnosis not present

## 2022-03-19 DIAGNOSIS — Z515 Encounter for palliative care: Secondary | ICD-10-CM

## 2022-03-19 DIAGNOSIS — Z789 Other specified health status: Secondary | ICD-10-CM

## 2022-03-19 DIAGNOSIS — G40909 Epilepsy, unspecified, not intractable, without status epilepticus: Secondary | ICD-10-CM

## 2022-03-19 DIAGNOSIS — I480 Paroxysmal atrial fibrillation: Secondary | ICD-10-CM

## 2022-03-19 LAB — CBC
HCT: 37 % — ABNORMAL LOW (ref 39.0–52.0)
Hemoglobin: 12.3 g/dL — ABNORMAL LOW (ref 13.0–17.0)
MCH: 29 pg (ref 26.0–34.0)
MCHC: 33.2 g/dL (ref 30.0–36.0)
MCV: 87.3 fL (ref 80.0–100.0)
Platelets: 263 10*3/uL (ref 150–400)
RBC: 4.24 MIL/uL (ref 4.22–5.81)
RDW: 17.2 % — ABNORMAL HIGH (ref 11.5–15.5)
WBC: 10.4 10*3/uL (ref 4.0–10.5)
nRBC: 1.5 % — ABNORMAL HIGH (ref 0.0–0.2)

## 2022-03-19 LAB — GLUCOSE, CAPILLARY
Glucose-Capillary: 166 mg/dL — ABNORMAL HIGH (ref 70–99)
Glucose-Capillary: 183 mg/dL — ABNORMAL HIGH (ref 70–99)
Glucose-Capillary: 207 mg/dL — ABNORMAL HIGH (ref 70–99)
Glucose-Capillary: 209 mg/dL — ABNORMAL HIGH (ref 70–99)

## 2022-03-19 LAB — BASIC METABOLIC PANEL
Anion gap: 15 (ref 5–15)
BUN: 62 mg/dL — ABNORMAL HIGH (ref 8–23)
CO2: 26 mmol/L (ref 22–32)
Calcium: 9.1 mg/dL (ref 8.9–10.3)
Chloride: 96 mmol/L — ABNORMAL LOW (ref 98–111)
Creatinine, Ser: 1.24 mg/dL (ref 0.61–1.24)
GFR, Estimated: >60 mL/min (ref >60)
Glucose, Bld: 180 mg/dL — ABNORMAL HIGH (ref 70–99)
Potassium: 4.4 mmol/L (ref 3.5–5.1)
Sodium: 137 mmol/L (ref 135–145)

## 2022-03-19 LAB — MAGNESIUM: Magnesium: 2.4 mg/dL (ref 1.7–2.4)

## 2022-03-19 LAB — APTT
aPTT: 66 seconds — ABNORMAL HIGH (ref 24–36)
aPTT: 138 seconds — ABNORMAL HIGH (ref 24–36)

## 2022-03-19 LAB — HEPARIN LEVEL (UNFRACTIONATED): Heparin Unfractionated: 1.02 IU/mL — ABNORMAL HIGH (ref 0.30–0.70)

## 2022-03-19 LAB — PHOSPHORUS: Phosphorus: 6.1 mg/dL — ABNORMAL HIGH (ref 2.5–4.6)

## 2022-03-19 MED ORDER — METHYLPREDNISOLONE SODIUM SUCC 40 MG IJ SOLR
40.0000 mg | Freq: Two times a day (BID) | INTRAMUSCULAR | Status: DC
  Administered 2022-03-19 – 2022-03-21 (×5): 40 mg via INTRAVENOUS
  Filled 2022-03-19 (×5): qty 1

## 2022-03-19 MED ORDER — APIXABAN 5 MG PO TABS
5.0000 mg | ORAL_TABLET | Freq: Two times a day (BID) | ORAL | Status: DC
  Administered 2022-03-19 – 2022-03-21 (×5): 5 mg via ORAL
  Filled 2022-03-19 (×5): qty 1

## 2022-03-19 MED ORDER — IPRATROPIUM-ALBUTEROL 0.5-2.5 (3) MG/3ML IN SOLN
3.0000 mL | Freq: Four times a day (QID) | RESPIRATORY_TRACT | Status: DC
  Administered 2022-03-19 – 2022-03-21 (×9): 3 mL via RESPIRATORY_TRACT
  Filled 2022-03-19 (×9): qty 3

## 2022-03-19 MED ORDER — FUROSEMIDE 40 MG PO TABS
40.0000 mg | ORAL_TABLET | Freq: Two times a day (BID) | ORAL | Status: DC
  Administered 2022-03-20 – 2022-03-21 (×3): 40 mg via ORAL
  Filled 2022-03-19 (×2): qty 2
  Filled 2022-03-19: qty 1
  Filled 2022-03-19: qty 2

## 2022-03-19 NOTE — TOC Progression Note (Signed)
Transition of Care [facility]) - Progression Note    Patient Details  Name: Noach Calvillo MRN: [id number] Date of Birth: 04-May-1961  Transition of Care [facility]) CM/SW Contact  Shelbie Hutching, RN Phone Number: 03/19/2022, 4:55 PM  Clinical Narrative:    Met with patient at the bedside, patient is doing much better tolerating [place] at 2L after being on Bipap over night.  Patient denies being on oxygen at home.  Patient says that he would like to have a rolling walker at home, need to check with Madelynn Done to see if patient already has one.   Palliative spoke with Guardian [name] about DNR/DNI- RNCM faxing progress notes to [name] at [phone] for her to review so she can make a decision on DNR status.    Expected Discharge Plan: Assisted Living Barriers to Discharge: Continued Medical Work up  Expected Discharge Plan and Services Expected Discharge Plan: Assisted Living       Living arrangements for the past 2 months: Assisted Living Facility                                       Social Determinants of Health (SDOH) Interventions    Readmission Risk Interventions    03/15/2022    2:44 PM  Readmission Risk Prevention Plan  HRI or [place] Complete  Social Work Consult for [place] Planning/Counseling Complete  Palliative Care Screening Not Applicable  Medication Review Press photographer) Complete

## 2022-03-19 NOTE — Consult Note (Signed)
ANTICOAGULATION CONSULT NOTE - Follow Up Consult  Pharmacy Consult for Eliquis to heparin gtt conversion Indication: atrial fibrillation  Allergies  Allergen Reactions   Iodinated Contrast Media Shortness Of Breath   Penicillins Anaphylaxis and Shortness Of Breath    Respiratory  Tolerated cefuroxime on 01/06/16   Strawberry Extract Anaphylaxis   Cefepime Itching    Empiric antibiotic, developed pruritis.    Erythromycin Itching   Sulfa Antibiotics Itching, Nausea And Vomiting and Nausea Only    Patient Measurements: Height: 5\' 9"  (175.3 cm) (per patient) Weight: 85.5 kg (188 lb 7.9 oz) IBW/kg (Calculated) : 70.7 Heparin Dosing Weight: 85.5kg  Vital Signs: Temp: 98.1 F (36.7 C) (09/11 1930) Temp Source: Axillary (09/11 1930) BP: 138/102 (09/11 2100) Pulse Rate: 99 (09/11 2100)  Labs: Recent Labs    03/18/22 0501 03/18/22 1818 03/18/22 1958 03/19/22 0044  HGB 11.5*  --   --   --   HCT 35.0*  --   --   --   PLT 272  --   --   --   APTT  --  29  --  66*  LABPROT  --  15.5*  --   --   INR  --  1.2  --   --   HEPARINUNFRC  --   --   --  1.02*  TROPONINIHS  --  124* 122*  --      Estimated Creatinine Clearance: 74.5 mL/min (by C-G formula based on SCr of 1.1 mg/dL).  Medications: No pertinent AC/APT related allergies. Heparin Dosing Weight: 85.5kg PTA: Eliquis 5mg  BID Inpatient: Eliquis 5mg  BID (last dose 0911 @0912 ) >> heparin gtt (9/11 1800>>  Assessment: 61 yo M w/ h/o HFrEF (EF 25-30%), CAD (s/p NSTEMI), h/o stroke, COPD, T1 BP disorder, HTN, hypothyroidism, HLD, parAFib (on eliquis), & seizure disorder admitted on 9/07 for COPDE a/w chest pain now resolved attributable to demand ischemia and c/b SI with psych consulted. Pharmacy consulted for the mgmt of heparin gtt.  Date Time aPTT/HL Rate/Comment       Baseline Labs: aPTT - pending INR - pending Hgb - 12.2>11.5 Plts - 212>272  Goal of Therapy:  Heparin level 0.3-0.7 units/ml aPTT 66-102  seconds Monitor platelets by anticoagulation protocol: Yes   Plan:  Eliquis 5mg  BID (last dose 0911 @0912 ).  9/12 @ 0044:  aPTT = 66,  HL = 1.02 aPTT is therapeutic X 1 but HL elevated from Eliquis PTA Will continue to use HL to guide dosing. Will draw confirmation aPTT on 9/12 @ 0700. Will recheck HL on 9/13 with AM labs.    Almer Littleton D 03/19/2022,1:24 AM

## 2022-03-19 NOTE — Progress Notes (Signed)
\ PROGRESS NOTE    Carl [name]  [id number] DOB: [birth year] DOA: 03/14/2022 PCP: [facility]    Brief Narrative:  y.o. male with medical history significant for bipolar disorder, CHF, COPD, depression, hypertension, coronary artery disease s/p NSTEMI, stroke and seizure disorder, who presented to the ER with acute onset of epigastric abdominal and left-sided chest pain when he was sitting outside in a wheelchair today per EMS.  He was fairly confused in the ER was experiencing flight of ideas was recently evaluated for SI/HI ER, sent to [facility] then to [facility].  He has been having dyspnea with associated wheezing as well as a cough with inability to expectorate.  No fever or chills.  No nausea or vomiting or abdominal pain.  No dysuria, oliguria or hematuria or flank pain reported.   On 02/16/2022 the patient had a 2D echo with EF of 25 to 30% with indeterminate left ventricular diastolic parameters, severe left atrial dilatation and mild right atrial dilatation with mild mitral valve regurgitation and mild aortic regurgitation.  Per outpatient cardiology notes patient has AICD in place  Current presentation is more consistent with decompensated COPD.  Cardiology consult on admission has been deferred at this time.  9/11: Called by bedside RN.  Worsening WOB and resp distress noted on bedside eval.  Poor air movement, bilateral wheeze.  CXR with cardiomegaly and evidence of fluid overload.  Transferred urgently to stepdown unit.  PCCM engaged for consultation.  Patient's respiratory status was quite poor however he responded to intravenous Lasix and BiPAP well.  9/12: Patient doing much better today.  Palliative care engaged for goals of care conversation.  Strongly suggest consideration of DNR status as patient with end-stage COPD and heart failure and poor overall prognosis.   Assessment & Plan:   Principal Problem:   COPD exacerbation ([place]) Active Problems:    Chest pain   Bipolar 1 disorder (HCC)   Essential hypertension   Hypothyroidism   Dyslipidemia   Paroxysmal atrial fibrillation (HCC)   Seizure disorder (HCC)  Acute exacerbation of COPD Patient presented with cough, inability to clear secretions, audible wheeze Presentation consistent with acute decompensated COPD Patient on 2 L nasal cannula Respiratory failure requiring BiPAP on 9/11 Plan: Solu-Medrol 120 mg daily Every 4-hour DuoNeb Twice daily Brovana Twice daily Pulmicort Oxygen as needed, currently on room air Transfer to PCU NIPPV as needed  Elevated troponin Atypical chest pain Likely demand ischemia Pain now resolved No indication of ACS EKG as needed chest pain  Acute on chronic systolic congestive heart failure Recent EF 25 to 30% Had concomitant CHF exacerbation with respiratory compromise on 9/11 Increase Lasix to 40 mg p.o. twice daily Continue GDMT  Seizure disorder PTA Keppra  Paroxysmal atrial fibrillation Rate controlled Continue PTA Eliquis and Coreg  Hyperlipidemia PTA statin  Hypothyroidism PTA Synthroid  Essential hypertension BP controlled Continue current regimen per home doses  Bipolar 1 disorder Stable Depakote and Seroquel  Poor functional status Patient with advance COPD and end-stage heart failure.  This confers a substantial risk to his long-term prognosis.  Strongly suggest consideration of DNR/DNI status.  Appreciate palliative care engagement.    DVT prophylaxis: Eliquis Code Status: Full Family Communication: None today Disposition Plan: Status is: Inpatient Remains inpatient appropriate because: Acute exacerbation of COPD on IV steroids and aggressive nebulizer regimen.  Anticipate 48 hours prior to medical readiness for discharge   Level of care: Progressive  Consultants:  None  Procedures:  None  Antimicrobials:  None   Subjective: Patient seen and examined.  Weaned off BiPAP.  Doing much better  today.  Still with some end expiratory wheeze but much improved than prior.  Objective: Vitals:   03/19/22 1400 03/19/22 1500 03/19/22 1617 03/19/22 1621  BP: 119/75 (!) 119/91 131/84   Pulse: 97 98 (!) 105 (!) 108  Resp: 20 (!) 21 (!) 35 (!) 32  Temp:      TempSrc:      SpO2: 99% 96% 96% 97%  Weight:      Height:        Intake/Output Summary (Last 24 hours) at 03/19/2022 1640 Last data filed at 03/19/2022 1600 Gross per 24 hour  Intake 1385.94 ml  Output 1300 ml  Net 85.94 ml   Filed Weights   03/14/22 1838 03/15/22 0041  Weight: 87 kg 85.5 kg    Examination:  General exam: No acute distress Respiratory system: Scattered and expiratory wheeze.  Normal work of breathing.  2 L Cardiovascular system: Distant heart sounds, S1-S2, no appreciable murmurs Gastrointestinal system: Soft, NT/ND, normal bowel sounds Central nervous system: Alert and oriented. No focal neurological deficits. Extremities: Symmetric 5 x 5 power. Skin: No rashes, lesions or ulcers Psychiatry: Judgement and insight appear normal. Mood & affect appropriate.     Data Reviewed: I have personally reviewed following labs and imaging studies  CBC: Recent Labs  Lab 03/14/22 1638 03/15/22 0511 03/18/22 0501 03/19/22 0421  WBC 8.6 5.1 13.1* 10.4  HGB 12.2* 11.8* 11.5* 12.3*  HCT 37.7* 35.6* 35.0* 37.0*  MCV 89.1 88.1 87.7 87.3  PLT 212 207 272 263   Basic Metabolic Panel: Recent Labs  Lab 03/14/22 1638 03/15/22 0511 03/19/22 0421  NA 138 138 137  K 4.4 4.4 4.4  CL 107 108 96*  CO2 21* 21* 26  GLUCOSE 118* 280* 180*  BUN 18 17 62*  CREATININE 1.28* 1.10 1.24  CALCIUM 9.6 9.3 9.1  MG  --   --  2.4  PHOS  --   --  6.1*   GFR: Estimated Creatinine Clearance: 66.1 mL/min (by C-G formula based on SCr of 1.24 mg/dL). Liver Function Tests: Recent Labs  Lab 03/14/22 1638  AST 40  ALT 42  ALKPHOS 82  BILITOT 0.6  PROT 6.9  ALBUMIN 3.3*   Recent Labs  Lab 03/14/22 1638  LIPASE 40    Recent Labs  Lab 03/14/22 1930  AMMONIA <10   Coagulation Profile: Recent Labs  Lab 03/18/22 1818  INR 1.2   Cardiac Enzymes: No results for input(s): "CKTOTAL", "CKMB", "CKMBINDEX", "TROPONINI" in the last 168 hours. BNP (last 3 results) No results for input(s): "PROBNP" in the last 8760 hours. HbA1C: No results for input(s): "HGBA1C" in the last 72 hours. CBG: Recent Labs  Lab 03/18/22 2002 03/19/22 0148 03/19/22 0445  GLUCAP 187* 166* 183*   Lipid Profile: No results for input(s): "CHOL", "HDL", "LDLCALC", "TRIG", "CHOLHDL", "LDLDIRECT" in the last 72 hours. Thyroid Function Tests: No results for input(s): "TSH", "T4TOTAL", "FREET4", "T3FREE", "THYROIDAB" in the last 72 hours. Anemia Panel: No results for input(s): "VITAMINB12", "FOLATE", "FERRITIN", "TIBC", "IRON", "RETICCTPCT" in the last 72 hours. Sepsis Labs: No results for input(s): "PROCALCITON", "LATICACIDVEN" in the last 168 hours.  Recent Results (from the past 240 hour(s))  Resp Panel by RT-PCR (Flu A&B, Covid) Anterior Nasal Swab     Status: None   Collection Time: 03/14/22 11:58 PM   Specimen: Anterior Nasal Swab  Result Value Ref Range Status  SARS Coronavirus 2 by RT PCR NEGATIVE NEGATIVE Final    Comment: (NOTE) SARS-CoV-2 target nucleic acids are NOT DETECTED.  The SARS-CoV-2 RNA is generally detectable in upper respiratory specimens during the acute phase of infection. The lowest concentration of SARS-CoV-2 viral copies this assay can detect is 138 copies/mL. A negative result does not preclude SARS-Cov-2 infection and should not be used as the sole basis for treatment or other patient management decisions. A negative result may occur with  improper specimen collection/handling, submission of specimen other than nasopharyngeal swab, presence of viral mutation(s) within the areas targeted by this assay, and inadequate number of viral copies(<138 copies/mL). A negative result must be combined  with clinical observations, patient history, and epidemiological information. The expected result is Negative.  Fact Sheet for Patients:  BloggerCourse.com  Fact Sheet for Healthcare Providers:  SeriousBroker.it  This test is no t yet approved or cleared by the [place] FDA and  has been authorized for detection and/or diagnosis of SARS-CoV-2 by FDA under an Emergency Use Authorization (EUA). This EUA will remain  in effect (meaning this test can be used) for the duration of the COVID-19 declaration under Section 564(b)(1) of the Act, 21 U.S.C.section 360bbb-3(b)(1), unless the authorization is terminated  or revoked sooner.       Influenza A by PCR NEGATIVE NEGATIVE Final   Influenza B by PCR NEGATIVE NEGATIVE Final    Comment: (NOTE) The Xpert Xpress SARS-CoV-2/FLU/RSV plus assay is intended as an aid in the diagnosis of influenza from Nasopharyngeal swab specimens and should not be used as a sole basis for treatment. Nasal washings and aspirates are unacceptable for Xpert Xpress SARS-CoV-2/FLU/RSV testing.  Fact Sheet for Patients: BloggerCourse.com  Fact Sheet for Healthcare Providers: SeriousBroker.it  This test is not yet approved or cleared by the [place] FDA and has been authorized for detection and/or diagnosis of SARS-CoV-2 by FDA under an Emergency Use Authorization (EUA). This EUA will remain in effect (meaning this test can be used) for the duration of the COVID-19 declaration under Section 564(b)(1) of the Act, 21 U.S.C. section 360bbb-3(b)(1), unless the authorization is terminated or revoked.  Performed at [facility], [address] [place], [place] [address]   MRSA Next Gen by PCR, Nasal     Status: None   Collection Time: 03/18/22  7:39 PM   Specimen: Nasal Mucosa; Nasal Swab  Result Value Ref Range Status   MRSA by PCR Next Gen  NOT DETECTED NOT DETECTED Final    Comment: (NOTE) The GeneXpert MRSA Assay (FDA approved for NASAL specimens only), is one component of a comprehensive MRSA colonization surveillance program. It is not intended to diagnose MRSA infection nor to guide or monitor treatment for MRSA infections. Test performance is not FDA approved in patients less than 12 years old. Performed at [facility], [address], [place], [place] [address]          Radiology Studies: DG Chest [place] 1 View  Result Date: 03/18/2022 CLINICAL DATA:  Shortness of breath EXAM: PORTABLE CHEST 1 VIEW COMPARISON:  Previous studies including the examination of 03/14/2022 FINDINGS: Transverse diameter of heart is increased. Central pulmonary vessels are more prominent. There is no focal pulmonary consolidation. There is no pleural effusion or pneumothorax. 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 Pacemaker/defibrillator battery is seen in the left infraclavicular region. Biventricular pacer leads are noted in place. There is previous internal fixation in left clavicle. IMPRESSION: Cardiomegaly. Central pulmonary vessels are more prominent suggesting  CHF. There is no focal pulmonary consolidation. Electronically Signed   By: Elmer Picker M.D.   On: 03/18/2022 16:22        Scheduled Meds:  apixaban  5 mg Oral BID   arformoterol  15 mcg Nebulization BID   atorvastatin  40 mg Oral Daily   budesonide (PULMICORT) nebulizer solution  0.25 mg Nebulization BID   carvedilol  3.125 mg Oral BID WC   Chlorhexidine Gluconate Cloth  6 each Topical Daily   divalproex  500 mg Oral BID   escitalopram  10 mg Oral QHS   feeding supplement  237 mL Oral BID BM   furosemide  40 mg Oral BID   guaiFENesin  600 mg Oral BID   hydrOXYzine  10 mg Oral BID   influenza vac split quadrivalent PF  0.5 mL Intramuscular Tomorrow-1000   ipratropium-albuterol  3 mL Nebulization Q6H   levothyroxine  25 mcg Oral Q0600   methocarbamol  500  mg Oral TID   methylPREDNISolone (SOLU-MEDROL) injection  40 mg Intravenous Q12H   pantoprazole  40 mg Oral Daily   pneumococcal 20-valent conjugate vaccine  0.5 mL Intramuscular Tomorrow-1000   QUEtiapine  200 mg Oral QHS   Continuous Infusions:  levETIRAcetam Stopped (03/19/22 0941)     LOS: 5 days     Sidney Ace, MD [facility]   If 7PM-7AM, please contact night-coverage  03/19/2022, 4:40 PM

## 2022-03-19 NOTE — Progress Notes (Signed)
OT Cancellation Note  Patient Details Name: Carl [name] MRN: [id number] DOB: 08-08-1960   Cancelled Treatment:    Reason Eval/Treat Not Completed: Medical issues which prohibited therapy. Per chart review, pt with respiratory distress and requiring higher level of care with transfer to ICU. OT to to complete order at this time. Please re-consult when pt is appropriate to participate in OT intervention.   Alen Bleacher 03/19/2022, 7:49 AM

## 2022-03-19 NOTE — Progress Notes (Signed)
Patient less WOB and less SOB Continue IV steroids BD therapy, advance feeds as tolerated Oxygen as needed  PCCM to sign off at this time

## 2022-03-19 NOTE — Consult Note (Signed)
ANTICOAGULATION CONSULT NOTE - Follow Up Consult  Pharmacy Consult for Eliquis to heparin gtt conversion Indication: atrial fibrillation  Allergies  Allergen Reactions   Iodinated Contrast Media Shortness Of Breath   Penicillins Anaphylaxis and Shortness Of Breath    Respiratory  Tolerated cefuroxime on 01/06/16   Strawberry Extract Anaphylaxis   Cefepime Itching    Empiric antibiotic, developed pruritis.    Erythromycin Itching   Sulfa Antibiotics Itching, Nausea And Vomiting and Nausea Only    Patient Measurements: Height: 5\' 9"  (175.3 cm) (per patient) Weight: 85.5 kg (188 lb 7.9 oz) IBW/kg (Calculated) : 70.7 Heparin Dosing Weight: 85.5kg  Vital Signs: BP: 105/78 (09/12 0500) Pulse Rate: 89 (09/12 0724)  Labs: Recent Labs    03/18/22 0501 03/18/22 1818 03/18/22 1958 03/19/22 0044 03/19/22 0421 03/19/22 0838  HGB 11.5*  --   --   --  12.3*  --   HCT 35.0*  --   --   --  37.0*  --   PLT 272  --   --   --  263  --   APTT  --  29  --  66*  --  138*  LABPROT  --  15.5*  --   --   --   --   INR  --  1.2  --   --   --   --   HEPARINUNFRC  --   --   --  1.02*  --   --   CREATININE  --   --   --   --  1.24  --   TROPONINIHS  --  124* 122*  --   --   --      Estimated Creatinine Clearance: 66.1 mL/min (by C-G formula based on SCr of 1.24 mg/dL).  Medications: No pertinent AC/APT related allergies. Heparin Dosing Weight: 85.5kg PTA: Eliquis 5mg  BID. (last dose 0911 @0912 ). Inpatient: Eliquis 5mg  BID (last dose 0911 @0912 ) >> heparin gtt (9/11 1800>>  Assessment: 61 yo M w/ h/o HFrEF (EF 25-30%), CAD (s/p NSTEMI), h/o stroke, COPD, T1 BP disorder, HTN, hypothyroidism, HLD, parAFib (on eliquis), & seizure disorder admitted on 9/07 for COPDE a/w chest pain now resolved attributable to demand ischemia and c/b SI with psych consulted. Pharmacy consulted for the mgmt of heparin gtt.  Date Time aPTT/HL Rate/Comment 9/12     0044    aPTT = 66 Therapeutic 9/12    0838   aPTT  = 138 Supra-therapeutic     Baseline Labs: aPTT - pending INR - pending Hgb - 12.2>11.5 Plts - 212>272  Goal of Therapy:  Heparin level 0.3-0.7 units/ml aPTT 66-102 seconds Monitor platelets by anticoagulation protocol: Yes   Plan: aPTT supra-therapeutic Hold heparin x 1 hour. Then, restart heparin at 950 units/hr. aPTT in 6 hours Daily HL and aPTT   62, PharmD Clinical Pharmacist  03/19/2022 9:36 AM

## 2022-03-19 NOTE — IPAL (Signed)
  Interdisciplinary Goals of Care Family Meeting   Date carried out: 03/19/2022  Location of the meeting: Phone conference  Member's involved: Physician and Other: [employer] JESSICA HODGE  Durable Power of Attorney or acting medical decision maker: [employer]        GOALS OF CARE DISCUSSION  The Clinical status was relayed to family in detail-  Updated and notified of patients medical condition- Patient remains unresponsive and will not open eyes to command.   Patient is having a weak cough and struggling to remove secretions.   Patient with increased WOB and using accessory muscles to breathe Explained to family course of therapy and the modalities   Patient with Progressive multiorgan failure with a very high probablity of a very minimal chance of meaningful recovery despite all aggressive and optimal medical therapy.   PATIENT REMAINS FULL CODE  Family understands the situation.  SEVERE RESP FAILURE HIGH RISK FOR CARDIAC ARREST RECOMMEND AVOIDING  SUFFERING WITH CPR IS HIGHLY RECOMMENDED AWAITING FINAL DSS PAPERS   Family are satisfied with Plan of action and management. All questions answered  Additional CC time 35 mins   Saraphina Lauderbaugh Santiago Glad, M.D.  [facility]  Medical Director [facility] Medical Director [facility] Cardio-Pulmonary Department

## 2022-03-19 NOTE — Consult Note (Signed)
Consultation Note Date: 03/19/2022   Patient Name: Carl [name]  DOB: Dec 01, 1960  MRN: [id number]  Age / Sex: 61 y.o., male  PCP: [facility] Referring Physician: Sidney Ace, MD  Reason for Consultation: Establishing goals of care  HPI/Patient Profile: 61 y.o. male  with past medical history of bipolar disorder (Depakote and Seroquel), CHF, COPD, HTN, CAD (s/p NSTEMI and AICD), depression, stroke, seizure disorder, paroxysmal A-fib (Eliquis), HLD, hypothyroidism, and recent evaluation for SI/HI (DC to [facility] [place] sent to [facility]) admitted on 03/14/2022 with acute epigastric abdominal pain and left-sided chest pain.  Patient is being treated for decompensated COPD, advanced HF (EF 25 to 30%) and elevated troponin (likely demand ischemia).  On 9/11, due to worsening work of breathing and respiratory distress, patient transferred to SDU.  Lasix, Solu-Medrol, and morphine given.  In light of patient's high risk for clinical deterioration, palliative medicine team was consulted to discuss goals of care.  Clinical Assessment and Goals of Care: I have reviewed medical records including EPIC notes, labs and imaging, assessed the patient and then met with patient at bedside.  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  I attempted to redirect the patient and ask about his work history.  He replied with the story in regards to Adult Protective Services and child protective services being called on his mentally disabled granddaughter.  Multiple attempts made to redirect patient to topic at hand.  However, patient unable to engage in meaningful, complex medical discussions.   I spoke with patient's legal guardian  Linda Hedges over the phone in regards to diagnosis prognosis, [place], EOL wishes, disposition and options.  I introduced Palliative Medicine as specialized medical care for people living with serious illness. It focuses on providing relief from the symptoms and stress of a serious illness. The goal is to improve quality of life for both the patient and the family.  We discussed a brief life review of the patient.  [name] shares patient has 2 daughters and 1 granddaughter, it is alleged patient has not spoken to in several years.    We discussed patient's current illness and what it means in the larger context of patient's on-going co-morbidities.  Brief medical update, including patient's transfer to stepdown unit, discussed.  Reviewed patient's CODE STATUS, high risk for clinical deterioration, advanced COPD, and advanced heart failure.  Encouraged [name] to consider DNR/DNI status understanding evidenced based poor outcomes in similar hospitalized patients, as the cause of the arrest is likely associated with chronic/terminal disease rather than a reversible acute cardio-pulmonary event. [name] shares she would like to review notes and recommendations from attending in order to move forward with DNR/DNI status.    Dr. Leverne Humbles, Dr. Stoney Bang, bedside RN, and [facility] made aware of Jessica's request for notes.  Fax number for legal guardian [phone]  [phone] provided to [facility].  Questions and concerns were addressed. [name] was encouraged to call PMT with any future palliative questions or concerns.   PMT will continue to follow the patient and shadow his chart. PMT wil re-engage if goals change, if patient's health deteriorates, or at patient/legal guardian's request.  Primary Decision Maker LEGAL GUARDIAN  Code Status/Advance Care Planning: Full code  Discharge Planning: SNF  Primary Diagnoses: Present on Admission:  COPD exacerbation ([place])  Bipolar 1 disorder ([place])  Hypothyroidism  Essential  hypertension  Chest pain   Physical Exam Vitals reviewed.  Constitutional:      General: He is not in acute distress.    Appearance: He is not ill-appearing.  HENT:     Head: Normocephalic.  Pulmonary:     Effort: Pulmonary effort is normal. No respiratory distress.     Comments: [place] in place Skin:    General: Skin is warm and dry.  Neurological:     Mental Status: He is alert.     Comments: Oriented to self, can identify he is at [facility] but cannot articulate linear thoughts after this  Psychiatric:        Mood and Affect: Mood is not anxious.        Behavior: Behavior normal.     Palliative Assessment/Data: 50%     Thank you for this consult. Palliative medicine will continue to follow and assist holistically.   Time Total: 75 minutes Greater than 50%  of this time was spent counseling and coordinating care related to the above assessment and plan.  Signed by: Jordan Hawks, DNP, FNP-BC Palliative Medicine    Please contact Palliative Medicine Team phone at [phone] for questions and concerns.  For individual provider: See Shea Evans

## 2022-03-19 NOTE — Consult Note (Signed)
NAME:  Carl [name], MRN:  [id number], DOB:  01-30-1961, LOS: 5 ADMISSION DATE:  03/14/2022, CONSULTATION DATE: 03/18/22 REFERRING MD: Dr. Georgeann Oppenheim, CHIEF COMPLAINT: Shortness of Breath    History of Present Illness:  This is a 61 yo male who presented to [facility] ER via EMS on 09/7 from [facility] with c/o chest and abdominal pain.  Per EMS run sheet the initial notification was for pt c/o breathing problems after sitting outside in his wheelchair in the heat.  However, upon EMS arrival the pt did not exhibit signs of respiratory distress.  Pt transported to [facility] for further evaluation.  The pt was recently discharged from [facility] ER to [facility] on 03/06/22 due to suicidal and homicidal ideation requiring IVC.    Hospital Course  Upon arrival to the ER pt confused and tachypneic with wheezing.  CT Head negative for acute abnormality.  CXR negative for cardiopulmonary abnormality.   Lab results revealed CO2 21/glucose 118/creatinine 1.28/albumin 3.3/BNP 378.3/troponin 100/hgb 12.2.  Pt received iv solumedrol and duonebs x2.  Pt subsequently admitted to the medsurg unit for additional workup and treatment per hospitalist team.   CT Abd Pelvis 09/7: No acute intra-abdominal or pelvic pathology. Colonic diverticulosis. No bowel obstruction. Normal appendix. A 3.1 cm infrarenal abdominal aortic aneurysm. Recommend follow-up ultrasound every 3 years. This recommendation follows ACR consensus guidelines: White Paper of the ACR Incidental Findings Committee II on Vascular Findings. J Am Coll Radiol 2013; 10:789-794. Aortic Atherosclerosis (ICD10-I70.0).  See detailed hospital course listed under significant events   Pertinent  Medical History  Bipolar 1 Disorder CKD COPD Depression HTN CAD s/p CABG  NSTEMI RVAD Seizure Disorder on Keppra  Stroke  Tobacco Abuse  Paroxysmal Atrial Fibrillation  Hypothyroidism  Chronic Diastolic Systolic CHF  Abdominal Aortic Aneurysm    Significant Hospital Events: Including procedures, antibiotic start and stop dates in addition to other pertinent events   09/7: Pt admitted to the [facility] unit with acute on chronic respiratory failure secondary to AECOPD and atypical chest pain with elevated troponin 09/11: Pt developed worsening acute on chronic respiratory failure with use of accessory muscles to breath secondary to AECOPD requiring Bipap and initial transfer to the progressive care unit.  However, despite Bipap respiratory status tenuous pt transferred to the stepdown unit PCCM team consulted to assist with management  9/12 remains on biPAP DSS updated waiting for DNR papers  Interim History / Subjective:  Patient remains on biPAP Severe wheezing Lethargic High risk for cardiac arrest and intubation   Objective   Blood pressure 105/78, pulse 89, temperature 98.1 F (36.7 C), temperature source Axillary, resp. rate 14, height 5\' 9"  (1.753 m), weight 85.5 kg, SpO2 99 %.    FiO2 (%):  [30 %] 30 %   Intake/Output Summary (Last 24 hours) at 03/19/2022 0738 Last data filed at 03/19/2022 0500 Gross per 24 hour  Intake 271.12 ml  Output 650 ml  Net -378.88 ml    Filed Weights   03/14/22 1838 03/15/22 0041  Weight: 87 kg 85.5 kg    REVIEW OF SYSTEMS  PATIENT IS UNABLE TO PROVIDE COMPLETE REVIEW OF SYSTEMS DUE TO SEVERE CRITICAL ILLNESS    PHYSICAL EXAMINATION:  GENERAL:critically ill appearing, +resp distress on biPAP EYES: Pupils equal, round, reactive to light.  No scleral icterus.  MOUTH: Moist mucosal membrane. NECK: Supple.  PULMONARY: +rhonchi, +wheezing CARDIOVASCULAR: S1 and S2.  No murmurs  GASTROINTESTINAL: Soft, nontender, +distended. Positive bowel sounds.  MUSCULOSKELETAL: No swelling,  clubbing, or edema.  NEUROLOGIC: obtunded SKIN:intact,warm,dry     Assessment & Plan:  61 yo [place] with acute and severe hypoxic and hypercapnic resp failure due to severe COPD  exacerbation with acute combined systolic and diastolic heart failure   Severe ACUTE Hypoxic and Hypercapnic Respiratory Failure High risk for cardiac arrest and intubation  ACUTE SYSTOLIC CARDIAC FAILURE- EF 25% Elevated troponin's likely demand ischemia  Hx: HTN, Biventricular ICD, MI, and Stroke   Echo 02/15/22: EF 25 to 30% -oxygen as needed -Lasix as tolerated -follow up cardiac enzymes as indicated   NEUROLOGY ACUTE METABOLIC ENCEPHALOPATHY Related to hypoxia and hypercapnia Avoid heavy sedatives Morphine as needed for WOB    ENDO - ICU hypoglycemic\Hyperglycemia protocol -check FSBS per protocol   GI GI PROPHYLAXIS as indicated  NUTRITIONAL STATUS DIET-->NPO Constipation protocol as indicated   ELECTROLYTES -follow labs as needed -replace as needed -pharmacy consultation and following   Best Practice (right click and "Reselect all SmartList Selections" daily)   Diet/type: NPO DVT prophylaxis: systemic heparin GI prophylaxis: PPI Lines: N/A Foley:  N/A Code Status:  full code Last date of multidisciplinary goals of care discussion [N/A]  I have updated DSS and recommend DNR/DNI status Labs   CBC: Recent Labs  Lab 03/14/22 1638 03/15/22 0511 03/18/22 0501 03/19/22 0421  WBC 8.6 5.1 13.1* 10.4  HGB 12.2* 11.8* 11.5* 12.3*  HCT 37.7* 35.6* 35.0* 37.0*  MCV 89.1 88.1 87.7 87.3  PLT 212 207 272 263     Basic Metabolic Panel: Recent Labs  Lab 03/14/22 1638 03/15/22 0511 03/19/22 0421  NA 138 138 137  K 4.4 4.4 4.4  CL 107 108 96*  CO2 21* 21* 26  GLUCOSE 118* 280* 180*  BUN 18 17 62*  CREATININE 1.28* 1.10 1.24  CALCIUM 9.6 9.3 9.1  MG  --   --  2.4  PHOS  --   --  6.1*    GFR: Estimated Creatinine Clearance: 66.1 mL/min (by C-G formula based on SCr of 1.24 mg/dL). Recent Labs  Lab 03/14/22 1638 03/15/22 0511 03/18/22 0501 03/19/22 0421  WBC 8.6 5.1 13.1* 10.4     Liver Function Tests: Recent Labs  Lab 03/14/22 1638   AST 40  ALT 42  ALKPHOS 82  BILITOT 0.6  PROT 6.9  ALBUMIN 3.3*    Recent Labs  Lab 03/14/22 1638  LIPASE 40    Recent Labs  Lab 03/14/22 1930  AMMONIA <10     ABG    Component Value Date/Time   PHART 7.44 03/18/2022 1603   PCO2ART 43 03/18/2022 1603   PO2ART 134 (H) 03/18/2022 1603   HCO3 29.2 (H) 03/18/2022 1603   ACIDBASEDEF 4.8 (H) 02/15/2022 0740   O2SAT 99.1 03/18/2022 1603     Coagulation Profile: Recent Labs  Lab 03/18/22 1818  INR 1.2    Cardiac Enzymes: No results for input(s): "CKTOTAL", "CKMB", "CKMBINDEX", "TROPONINI" in the last 168 hours.  HbA1C: Hgb A1c MFr Bld  Date/Time Value Ref Range Status  11/03/2018 07:48 AM 5.6 4.8 - 5.6 % Final    Comment:    (NOTE) Pre diabetes:          5.7%-6.4% Diabetes:              >6.4% Glycemic control for   <7.0% adults with diabetes     CBG: Recent Labs  Lab 03/18/22 2002 03/19/22 0148 03/19/22 0445  GLUCAP 187* 166* 183*   Prior to Admission medications  Medication Sig Start Date End Date Taking? Authorizing Provider  apixaban (ELIQUIS) 5 MG TABS tablet TAKE 1 TABLET BY MOUTH TWICE A DAY 01/18/21  Yes Duke Salvia, MD  atorvastatin (LIPITOR) 40 MG tablet Take 1 tablet (40 mg total) by mouth daily. 09/20/21  Yes Iran Ouch, MD  carvedilol (COREG) 3.125 MG tablet Take 3.125 mg by mouth 2 (two) times daily with a meal.   Yes [provider]  divalproex (DEPAKOTE ER) 500 MG 24 hr tablet Take 1 tablet (500 mg total) by mouth 2 (two) times daily. 05/24/19  Yes Clapacs, Jackquline Denmark, MD  furosemide (LASIX) 40 MG tablet Take 1 tablet (40 mg total) by mouth daily. 08/01/20  Yes Duke Salvia, MD  Hydrocortisone Acetate 1 % CREA SMARTSIG:sparingly Topical 3 Times Daily 12/24/21  Yes [provider]  hydrOXYzine (ATARAX) 10 MG tablet Take 10 mg by mouth 2 (two) times daily. 12/24/21  Yes [provider]  levETIRAcetam (KEPPRA) 250 MG tablet Take 1 tablet (250 mg total) by  mouth 2 (two) times daily. 03/24/20  Yes Minna Antis, MD  levothyroxine (SYNTHROID) 25 MCG tablet Take 1 tablet (25 mcg total) by mouth daily. 05/24/19  Yes Clapacs, Jackquline Denmark, MD  lisinopril (ZESTRIL) 2.5 MG tablet TAKE 1 TABLET BY MOUTH ONCE DAILY 01/21/21  Yes Alver Sorrow, NP  omeprazole (PRILOSEC) 20 MG capsule Take 20 mg by mouth daily.   Yes [provider]  QUEtiapine (SEROQUEL) 200 MG tablet Take 200 mg by mouth at bedtime.   Yes [provider]  tiotropium (SPIRIVA) 18 MCG inhalation capsule Place 18 mcg into inhaler and inhale daily.   Yes [provider]  traMADol (ULTRAM) 50 MG tablet Take 50 mg by mouth 2 (two) times daily as needed. 01/16/22  Yes [provider]  acetaminophen (TYLENOL) 500 MG tablet Take 500 mg by mouth every 6 (six) hours as needed for mild pain.    [provider]  albuterol (VENTOLIN HFA) 108 (90 Base) MCG/ACT inhaler Inhale 2 puffs into the lungs every 6 (six) hours as needed for wheezing. 05/24/19 04/28/22  Clapacs, Jackquline Denmark, MD  escitalopram (LEXAPRO) 10 MG tablet Take 1 tablet (10 mg total) by mouth at bedtime. 05/24/19   Clapacs, Jackquline Denmark, MD  feeding supplement (ENSURE ENLIVE / ENSURE PLUS) LIQD Take 237 mLs by mouth 2 (two) times daily between meals. 05/26/21   Alford Highland, MD  Fluticasone-Salmeterol (ADVAIR) 250-50 MCG/DOSE AEPB Inhale 1 puff into the lungs 2 (two) times daily. Patient not taking: Reported on 03/15/2022 05/26/21   Alford Highland, MD  predniSONE (DELTASONE) 10 MG tablet Take 10 mg by mouth daily with breakfast. Patient not taking: Reported on 03/15/2022    [provider]     DVT/GI PRX  assessed I Assessed the need for Labs I Assessed the need for Foley I Assessed the need for Central Venous Line Family Discussion when available I Assessed the need for Mobilization I made an Assessment of medications to be adjusted accordingly Safety Risk assessment completed  CASE  DISCUSSED IN MULTIDISCIPLINARY ROUNDS WITH ICU TEAM     Critical Care Time devoted to patient care services described in this note is 55 minutes.  Critical care was necessary to treat /prevent imminent and life-threatening deterioration. Overall, patient is critically ill, prognosis is guarded.  Patient with Multiorgan failure and at high risk for cardiac arrest and death.    Lucie Leather, M.D.  [facility]  [facility]  Medical Director [facility] Director [facility] Cardio-Pulmonary Department

## 2022-03-20 ENCOUNTER — Encounter: Payer: Self-pay | Admitting: Family Medicine

## 2022-03-20 DIAGNOSIS — R1084 Generalized abdominal pain: Secondary | ICD-10-CM | POA: Diagnosis not present

## 2022-03-20 DIAGNOSIS — J441 Chronic obstructive pulmonary disease with (acute) exacerbation: Secondary | ICD-10-CM | POA: Diagnosis not present

## 2022-03-20 DIAGNOSIS — I480 Paroxysmal atrial fibrillation: Secondary | ICD-10-CM | POA: Diagnosis not present

## 2022-03-20 DIAGNOSIS — F319 Bipolar disorder, unspecified: Secondary | ICD-10-CM | POA: Diagnosis not present

## 2022-03-20 LAB — GLUCOSE, CAPILLARY
Glucose-Capillary: 147 mg/dL — ABNORMAL HIGH (ref 70–99)
Glucose-Capillary: 222 mg/dL — ABNORMAL HIGH (ref 70–99)
Glucose-Capillary: 309 mg/dL — ABNORMAL HIGH (ref 70–99)
Glucose-Capillary: 284 mg/dL — ABNORMAL HIGH (ref 70–99)

## 2022-03-20 LAB — CBC
HCT: 36.7 % — ABNORMAL LOW (ref 39.0–52.0)
Hemoglobin: 12.2 g/dL — ABNORMAL LOW (ref 13.0–17.0)
MCH: 29 pg (ref 26.0–34.0)
MCHC: 33.2 g/dL (ref 30.0–36.0)
MCV: 87.2 fL (ref 80.0–100.0)
Platelets: 258 10*3/uL (ref 150–400)
RBC: 4.21 MIL/uL — ABNORMAL LOW (ref 4.22–5.81)
RDW: 17.3 % — ABNORMAL HIGH (ref 11.5–15.5)
WBC: 10.2 10*3/uL (ref 4.0–10.5)
nRBC: 2.1 % — ABNORMAL HIGH (ref 0.0–0.2)

## 2022-03-20 NOTE — Progress Notes (Signed)
Upon assessment, pts pacemaker started beeping, I called did a carelink express with [employer] device from 2 A, [employer] called with report and that they had fixed pts alert, so it should go off again on this event, MD notified and report placed in chart.

## 2022-03-20 NOTE — Progress Notes (Signed)
Progress Note    Carl [name]  HQI:[id number] DOB: [birth year]  DOA: 03/14/2022 PCP: Almetta Lovely, [facility]      Brief Narrative:    Medical records reviewed and are as summarized below:  Carl [name] is a 61 y.o. male with medical history significant for bipolar disorder, CHF, COPD, depression, hypertension, coronary artery disease s/p NSTEMI, stroke and seizure disorder, who presented to the ER with acute onset of epigastric abdominal and left-sided chest pain when he was sitting outside in a wheelchair on the day of admission per EMS.  He was fairly confused in the ER and was experiencing flight of ideas.  He was recently evaluated for suicidal and homicidal ideation in the ER, sent to [facility] then to [facility].  He has been having dyspnea with associated wheezing as well as a cough with inability to expectorate.      Assessment/Plan:   Principal Problem:   COPD exacerbation (HCC) Active Problems:   Chest pain   Bipolar 1 disorder (HCC)   Essential hypertension   Hypothyroidism   Dyslipidemia   Paroxysmal atrial fibrillation (HCC)   Seizure disorder (HCC)    Body mass index is 27.84 kg/m.   Acute COPD exacerbation: Continue IV steroids and bronchodilators.  Acute hypoxic and hypercapnic respiratory failure: He is off of BiPAP and tolerating 2 L/min oxygen via nasal cannula.  Acute exacerbation of chronic systolic CHF: Improved.  Continue oral Lasix.  2D echo showed EF estimated at 25% to 30% on 02/15/2022.  Mild elevated troponins: This was attributed to demand ischemia.  Acute metabolic encephalopathy: Improved  Seizure disorder: Continue Keppra  Bipolar disorder: Continue psychotropics  Other comorbidities include hyperlipidemia, hypothyroidism, hypertension,   Diet Order             Diet Heart Room service appropriate? Yes; Fluid consistency: Thin  Diet effective now                             Consultants: Intensivist Palliative care  Procedures: None    Medications:    apixaban  5 mg Oral BID   arformoterol  15 mcg Nebulization BID   atorvastatin  40 mg Oral Daily   budesonide (PULMICORT) nebulizer solution  0.25 mg Nebulization BID   carvedilol  3.125 mg Oral BID WC   Chlorhexidine Gluconate Cloth  6 each Topical Daily   divalproex  500 mg Oral BID   escitalopram  10 mg Oral QHS   feeding supplement  237 mL Oral BID BM   furosemide  40 mg Oral BID   guaiFENesin  600 mg Oral BID   hydrOXYzine  10 mg Oral BID   [COMPLETED] influenza vac split quadrivalent PF  0.5 mL Intramuscular Tomorrow-1000   ipratropium-albuterol  3 mL Nebulization Q6H   levothyroxine  25 mcg Oral Q0600   methocarbamol  500 mg Oral TID   methylPREDNISolone (SOLU-MEDROL) injection  40 mg Intravenous Q12H   pantoprazole  40 mg Oral Daily   [COMPLETED] pneumococcal 20-valent conjugate vaccine  0.5 mL Intramuscular Tomorrow-1000   QUEtiapine  200 mg Oral QHS   Continuous Infusions:  levETIRAcetam Stopped (03/19/22 2233)     Anti-infectives (From admission, onward)    Start     Dose/Rate Route Frequency Ordered Stop   03/15/22 0058  levofloxacin (LEVAQUIN) IVPB 750 mg  Status:  Discontinued        750 mg 100 mL/hr over 90 Minutes Intravenous  Every 24 hours 03/15/22 0018 03/15/22 1152              Family Communication/Anticipated D/C date and plan/Code Status   DVT prophylaxis:  apixaban (ELIQUIS) tablet 5 mg     Code Status: Full Code  Family Communication: None Disposition Plan: Plan to discharge to assisted living facility in 2 to 3 days   Status is: Inpatient Remains inpatient appropriate because: COPD exacerbation       Subjective:   Interval events noted.  He complains of cough.  Breathing is better.  No chest pain.  Objective:    Vitals:   03/20/22 0736 03/20/22 0739 03/20/22 0742 03/20/22 0800  BP: 115/87   (!) 127/97  Pulse:  88 97 (!) 106 98  Resp: (!) 28 (!) 23  (!) 25  Temp: (!) 97.4 F (36.3 C)     TempSrc: Oral     SpO2: 99% 98%  96%  Weight:      Height:       No data found.   Intake/Output Summary (Last 24 hours) at 03/20/2022 0914 Last data filed at 03/20/2022 0600 Gross per 24 hour  Intake 1180.38 ml  Output 1450 ml  Net -269.62 ml   Filed Weights   03/14/22 1838 03/15/22 0041  Weight: 87 kg 85.5 kg    Exam:  GEN: NAD SKIN: Warm and dry EYES: No pallor or icterus ENT: MMM CV: RRR PULM: B/l expiratory wheezing, no rales heard ABD: soft, ND, NT, +BS CNS: AAO x 2 (person and place), non focal EXT: No edema or tenderness        Data Reviewed:   I have personally reviewed following labs and imaging studies:  Labs: Labs show the following:   Basic Metabolic Panel: Recent Labs  Lab 03/14/22 1638 03/15/22 0511 03/19/22 0421  NA 138 138 137  K 4.4 4.4 4.4  CL 107 108 96*  CO2 21* 21* 26  GLUCOSE 118* 280* 180*  BUN 18 17 62*  CREATININE 1.28* 1.10 1.24  CALCIUM 9.6 9.3 9.1  MG  --   --  2.4  PHOS  --   --  6.1*   GFR Estimated Creatinine Clearance: 66.1 mL/min (by C-G formula based on SCr of 1.24 mg/dL). Liver Function Tests: Recent Labs  Lab 03/14/22 1638  AST 40  ALT 42  ALKPHOS 82  BILITOT 0.6  PROT 6.9  ALBUMIN 3.3*   Recent Labs  Lab 03/14/22 1638  LIPASE 40   Recent Labs  Lab 03/14/22 1930  AMMONIA <10   Coagulation profile Recent Labs  Lab 03/18/22 1818  INR 1.2    CBC: Recent Labs  Lab 03/14/22 1638 03/15/22 0511 03/18/22 0501 03/19/22 0421 03/20/22 0419  WBC 8.6 5.1 13.1* 10.4 10.2  HGB 12.2* 11.8* 11.5* 12.3* 12.2*  HCT 37.7* 35.6* 35.0* 37.0* 36.7*  MCV 89.1 88.1 87.7 87.3 87.2  PLT 212 207 272 263 258   Cardiac Enzymes: No results for input(s): "CKTOTAL", "CKMB", "CKMBINDEX", "TROPONINI" in the last 168 hours. BNP (last 3 results) No results for input(s): "PROBNP" in the last 8760 hours. CBG: Recent Labs  Lab  03/19/22 0148 03/19/22 0445 03/19/22 1643 03/19/22 2221 03/20/22 0806  GLUCAP 166* 183* 207* 209* 147*   D-Dimer: No results for input(s): "DDIMER" in the last 72 hours. Hgb A1c: No results for input(s): "HGBA1C" in the last 72 hours. Lipid Profile: No results for input(s): "CHOL", "HDL", "LDLCALC", "TRIG", "CHOLHDL", "LDLDIRECT" in the last 72 hours. Thyroid  function studies: No results for input(s): "TSH", "T4TOTAL", "T3FREE", "THYROIDAB" in the last 72 hours.  Invalid input(s): "FREET3" Anemia work up: No results for input(s): "VITAMINB12", "FOLATE", "FERRITIN", "TIBC", "IRON", "RETICCTPCT" in the last 72 hours. Sepsis Labs: Recent Labs  Lab 03/15/22 0511 03/18/22 0501 03/19/22 0421 03/20/22 0419  WBC 5.1 13.1* 10.4 10.2    Microbiology Recent Results (from the past 240 hour(s))  Resp Panel by RT-PCR (Flu A&B, Covid) Anterior Nasal Swab     Status: None   Collection Time: 03/14/22 11:58 PM   Specimen: Anterior Nasal Swab  Result Value Ref Range Status   SARS Coronavirus 2 by RT PCR NEGATIVE NEGATIVE Final    Comment: (NOTE) SARS-CoV-2 target nucleic acids are NOT DETECTED.  The SARS-CoV-2 RNA is generally detectable in upper respiratory specimens during the acute phase of infection. The lowest concentration of SARS-CoV-2 viral copies this assay can detect is 138 copies/mL. A negative result does not preclude SARS-Cov-2 infection and should not be used as the sole basis for treatment or other patient management decisions. A negative result may occur with  improper specimen collection/handling, submission of specimen other than nasopharyngeal swab, presence of viral mutation(s) within the areas targeted by this assay, and inadequate number of viral copies(<138 copies/mL). A negative result must be combined with clinical observations, patient history, and epidemiological information. The expected result is Negative.  Fact Sheet for Patients:   BloggerCourse.com  Fact Sheet for Healthcare Providers:  SeriousBroker.it  This test is no t yet approved or cleared by the [place] FDA and  has been authorized for detection and/or diagnosis of SARS-CoV-2 by FDA under an Emergency Use Authorization (EUA). This EUA will remain  in effect (meaning this test can be used) for the duration of the COVID-19 declaration under Section 564(b)(1) of the Act, 21 U.S.C.section 360bbb-3(b)(1), unless the authorization is terminated  or revoked sooner.       Influenza A by PCR NEGATIVE NEGATIVE Final   Influenza B by PCR NEGATIVE NEGATIVE Final    Comment: (NOTE) The Xpert Xpress SARS-CoV-2/FLU/RSV plus assay is intended as an aid in the diagnosis of influenza from Nasopharyngeal swab specimens and should not be used as a sole basis for treatment. Nasal washings and aspirates are unacceptable for Xpert Xpress SARS-CoV-2/FLU/RSV testing.  Fact Sheet for Patients: BloggerCourse.com  Fact Sheet for Healthcare Providers: SeriousBroker.it  This test is not yet approved or cleared by the [place] FDA and has been authorized for detection and/or diagnosis of SARS-CoV-2 by FDA under an Emergency Use Authorization (EUA). This EUA will remain in effect (meaning this test can be used) for the duration of the COVID-19 declaration under Section 564(b)(1) of the Act, 21 U.S.C. section 360bbb-3(b)(1), unless the authorization is terminated or revoked.  Performed at [facility], [address] [place], [place] [address]   MRSA Next Gen by PCR, Nasal     Status: None   Collection Time: 03/18/22  7:39 PM   Specimen: Nasal Mucosa; Nasal Swab  Result Value Ref Range Status   MRSA by PCR Next Gen NOT DETECTED NOT DETECTED Final    Comment: (NOTE) The GeneXpert MRSA Assay (FDA approved for NASAL specimens only), is one component of a  comprehensive MRSA colonization surveillance program. It is not intended to diagnose MRSA infection nor to guide or monitor treatment for MRSA infections. Test performance is not FDA approved in patients less than 43 years old. Performed at [facility], [address] [place],  [place] [address]     Procedures and diagnostic studies:  DG Chest Port 1 View  Result Date: 03/18/2022 CLINICAL DATA:  Shortness of breath EXAM: PORTABLE CHEST 1 VIEW COMPARISON:  Previous studies including the examination of 03/14/2022 FINDINGS: Transverse diameter of heart is increased. Central pulmonary vessels are more prominent. There is no focal pulmonary consolidation. There is no pleural effusion or pneumothorax. 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 Pacemaker/defibrillator battery is seen in the left infraclavicular region. Biventricular pacer leads are noted in place. There is previous internal fixation in left clavicle. IMPRESSION: Cardiomegaly. Central pulmonary vessels are more prominent suggesting CHF. There is no focal pulmonary consolidation. Electronically Signed   By: Ernie Avena M.D.   On: 03/18/2022 16:22               LOS: 6 days   Carl [name]  [facility]   Pager on [email or web address]. If 7PM-7AM, please contact night-coverage at [email or web address]     03/20/2022, 9:14 AM

## 2022-03-20 NOTE — Progress Notes (Signed)
Physical Therapy Treatment Patient Details Name: Carl [name] MRN: [id number] DOB: [birth year] Today's Date: 03/20/2022   History of Present Illness Pt is a 61 y/o M admitted on 03/14/22 after presenting to the ER with acute onset of epigastric abdominal & L sided chest pain. Pt is being treated for acute exacerbation of COPD. PMH: bipolar disorder, CHF, COPD, depression, HTN, CAD s/p NSTEMI, stroke, seizure disorder    PT Comments    Pt with some confusion but willing to work with PT with some minimal encouragement.  He struggled to follow instructions for LE exercises but moved very well with a walker going 200 ft with consistent and safe gait.  He was on 2L O2 with sats staying in the high 90s.  Pt does not use a walker at baseline and did show some unsteadiness in standing w/o it but otherwise did do well.  Recommendations for follow up therapy are one component of a multi-disciplinary discharge planning process, led by the attending physician.  Recommendations may be updated based on patient status, additional functional criteria and insurance authorization.  Follow Up Recommendations  Home health PT     Assistance Recommended at Discharge Intermittent Supervision/Assistance  Patient can return home with the following A little help with walking and/or transfers;A little help with bathing/dressing/bathroom;Assist for transportation;Assistance with cooking/housework;Help with stairs or ramp for entrance;Direct supervision/assist for medications management;Direct supervision/assist for financial management   Equipment Recommendations  Rolling walker (2 wheels)    Recommendations for Other Services       Precautions / Restrictions Precautions Precautions: Fall Restrictions Weight Bearing Restrictions: No     Mobility  Bed Mobility Overal bed mobility: Modified Independent Bed Mobility: Supine to Sit     Supine to sit: Modified independent (Device/Increase time)     General bed  mobility comments: Pt able to get himself to sitting EOB with ease    Transfers Overall transfer level: Modified independent Equipment used: Rolling walker (2 wheels) Transfers: Sit to/from Stand Sit to Stand: Modified independent (Device/Increase time)           General transfer comment: Pt able to rise with confidence, safe and    Ambulation/Gait Ambulation/Gait assistance: Supervision Gait Distance (Feet): 200 Feet Assistive device: Rolling walker (2 wheels)         General Gait Details: Pt was able to maintain a relatively consistent and confident cadence.  He c/o some L>R knee stiffness and repeatedly talked about how weak the L LE was but there was no buckling, stagger steps or unsteadiness the entire time he was ambulation.  Pt on 2L with O2 remaining in the high 90s and HR staying ~100 (+/- 10 bpm) t/o the effort   Stairs             Wheelchair Mobility    Modified Rankin (Stroke Patients Only)       Balance Overall balance assessment: Needs assistance Sitting-balance support: Single extremity supported Sitting balance-Leahy Scale: Good     Standing balance support: Bilateral upper extremity supported Standing balance-Leahy Scale: Good Standing balance comment: pt indicated that he has chronic balance issues but with walker showed no safety concerns                            Cognition Arousal/Alertness: Awake/alert Behavior During Therapy: Restless Overall Cognitive Status: History of cognitive impairments - at baseline  Exercises General Exercises - Lower Extremity Ankle Circles/Pumps: AROM, 5 reps Long Arc Quad: AROM, Strengthening, 10 reps (resisted on R, AROM only on L) Heel Slides: AROM, Strengthening, 10 reps Hip Flexion/Marching: AROM, Strengthening, 10 reps    General Comments        Pertinent Vitals/Pain Pain Assessment Pain Assessment: No/denies pain     Home Living                          Prior Function            PT Goals (current goals can now be found in the care plan section) Progress towards PT goals: Progressing toward goals    Frequency    Min 2X/week      PT Plan Current plan remains appropriate    Co-evaluation              AM-PAC PT "6 Clicks" Mobility   Outcome Measure  Help needed turning from your back to your side while in a flat bed without using bedrails?: None Help needed moving from lying on your back to sitting on the side of a flat bed without using bedrails?: None Help needed moving to and from a bed to a chair (including a wheelchair)?: A Little Help needed standing up from a chair using your arms (e.g., wheelchair or bedside chair)?: None Help needed to walk in hospital room?: A Little Help needed climbing 3-5 steps with a railing? : A Little 6 Click Score: 21    End of Session Equipment Utilized During Treatment: Oxygen (2L) Activity Tolerance: Patient tolerated treatment well Patient left: in chair;with call bell/phone within reach Nurse Communication: Mobility status PT Visit Diagnosis: Unsteadiness on feet (R26.81);Muscle weakness (generalized) (M62.81)     Time: 3235-5732 PT Time Calculation (min) (ACUTE ONLY): 28 min  Charges:                        Malachi Pro, DPT 03/20/2022, 4:38 PM

## 2022-03-20 NOTE — Progress Notes (Addendum)
                                                     Palliative Care Progress Note   Patient Name: Carl [name]       Date: 03/20/2022 DOB: Jul 04, 1961  Age: 61 y.o. MRN#: [id number] Attending Physician: Lurene Shadow, MD Primary Care Physician: [facility] Admit Date: 03/14/2022  HPI: 61 y.o. male  with past medical history of bipolar disorder (Depakote and Seroquel), CHF, COPD, HTN, CAD (s/p NSTEMI and AICD), depression, stroke, seizure disorder, paroxysmal A-fib (Eliquis), HLD, hypothyroidism, and recent evaluation for SI/HI (DC to [facility] [place] [facility] to [facility]) admitted on 03/14/2022 with acute epigastric abdominal pain and left-sided chest pain.  Patient is being treated for decompensated COPD, advanced HF (EF 25 to 30%) and elevated troponin (likely demand ischemia).   On 9/11, due to worsening work of breathing and respiratory distress, patient transferred to SDU. Lasix, Solu-Medrol, and morphine given.   In light of patient's high risk for clinical deterioration, palliative medicine team was consulted to discuss goals of care.  PE: Respirations even and unlabored, nasal cannula in place, no signs of respiratory distress, increased work of breathing, or breathlessness noted.  Patient acknowledges my presence and is able to make his wishes known.  Discussion: After reviewing the patient's chart and assessing the patient at bedside, I counseled with nursing and CCM.  Patient is almost back to baseline respiratory and functional status.  Mental status remains at his baseline. Symptom burden is low.  Goals are clear.  Full code and full scope of treatment.   Patient's legal guardian made aware of recommendation for DNR/DNI status.  She also has PMT contact information and is encouraged to call with any future palliative needs.  PMT will  shadow the patient's chart and reengage at patient/legal guardian's request, if goals change, or if patient's health deteriorates during hospitalization.  Thank you for allowing the Palliative Medicine Team to assist in the care of [facility].  Total Time 25 minutes  Greater than 50%  of this time was spent counseling and coordinating care related to the above assessment and plan.  Samara Deist L. Manon Hilding, FNP-BC Palliative Medicine Team Team Phone # [phone]

## 2022-03-21 ENCOUNTER — Ambulatory Visit: Payer: Medicaid Other | Admitting: Family

## 2022-03-21 DIAGNOSIS — J9601 Acute respiratory failure with hypoxia: Secondary | ICD-10-CM | POA: Diagnosis not present

## 2022-03-21 DIAGNOSIS — F319 Bipolar disorder, unspecified: Secondary | ICD-10-CM | POA: Diagnosis not present

## 2022-03-21 DIAGNOSIS — I5023 Acute on chronic systolic (congestive) heart failure: Secondary | ICD-10-CM | POA: Diagnosis not present

## 2022-03-21 DIAGNOSIS — J441 Chronic obstructive pulmonary disease with (acute) exacerbation: Secondary | ICD-10-CM | POA: Diagnosis not present

## 2022-03-21 LAB — CBC
HCT: 37 % — ABNORMAL LOW (ref 39.0–52.0)
Hemoglobin: 12.2 g/dL — ABNORMAL LOW (ref 13.0–17.0)
MCH: 28.6 pg (ref 26.0–34.0)
MCHC: 33 g/dL (ref 30.0–36.0)
MCV: 86.9 fL (ref 80.0–100.0)
Platelets: 258 10*3/uL (ref 150–400)
RBC: 4.26 MIL/uL (ref 4.22–5.81)
RDW: 17.2 % — ABNORMAL HIGH (ref 11.5–15.5)
WBC: 9.4 10*3/uL (ref 4.0–10.5)
nRBC: 2 % — ABNORMAL HIGH (ref 0.0–0.2)

## 2022-03-21 LAB — GLUCOSE, CAPILLARY
Glucose-Capillary: 229 mg/dL — ABNORMAL HIGH (ref 70–99)
Glucose-Capillary: 251 mg/dL — ABNORMAL HIGH (ref 70–99)

## 2022-03-21 MED ORDER — LEVETIRACETAM 250 MG PO TABS: 250.0000 mg | ORAL_TABLET | Freq: Two times a day (BID) | ORAL | Status: DC

## 2022-03-21 MED ORDER — ALBUTEROL SULFATE (2.5 MG/3ML) 0.083% IN NEBU: 2.5000 mg | INHALATION_SOLUTION | Freq: Four times a day (QID) | RESPIRATORY_TRACT | 0 refills | Status: DC | PRN

## 2022-03-21 NOTE — NC FL2 (Signed)
[place] MEDICAID FL2 LEVEL OF CARE SCREENING TOOL     IDENTIFICATION  Patient Name: Carl [name] Birthdate: 12/23/1960 Sex: male Admission Date (Current Location): 03/14/2022  [facility] [place] and [place] Number:  Chiropodist and Address:  [facility], [address], [place], [place] [address]      Provider Number: [id number]  Attending Physician Name and Address:  Lurene Shadow, MD  Relative Name and Phone Number:  Shanda Bumps [phone]    Current Level of Care: Hospital Recommended Level of Care: Assisted Living Facility, Other (Comment) [facility]) Prior Approval Number:    Date Approved/Denied:   PASRR Number:    Discharge Plan: Other (Comment) ([facility])    Current Diagnoses: Patient Active Problem List   Diagnosis Date Noted   Chest pain    COPD exacerbation (HCC) 03/14/2022   Seizure disorder (HCC) 03/14/2022   Dyspnea    Acute exacerbation of CHF (congestive heart failure) (HCC) 02/15/2022   Anxiety 02/15/2022   Tobacco use 02/15/2022   Elevated partial thromboplastin time (PTT) 02/15/2022   Abdominal pain 02/15/2022   HFrEF (heart failure with reduced ejection fraction) (HCC)    Weakness    COPD with acute exacerbation (HCC) 05/23/2021   Paroxysmal atrial fibrillation (HCC) 05/23/2021   Chronic anticoagulation 05/23/2021   Abdominal aortic aneurysm (AAA) 35 to 39 mm in diameter (HCC) 05/23/2021   Ischemic cardiomyopathy 05/22/2020   Chronic systolic heart failure (HCC) 05/22/2020   HCAP (healthcare-associated pneumonia) 01/24/2020   Hyperkalemia 01/24/2020   Acute on chronic combined systolic (congestive) and diastolic (congestive) heart failure (HCC) 01/24/2020   Seizure (HCC) 01/24/2020   HLD (hyperlipidemia) 01/24/2020   Ascites due to alcoholic cirrhosis (HCC)    Acute on chronic systolic CHF (congestive heart failure) (HCC) 11/18/2019   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/18/2019    Elevated troponin 11/18/2019   Hypoxia 11/18/2019   Acute renal failure superimposed on stage 3a chronic kidney disease (HCC) 11/18/2019   Coronary artery disease 05/15/2019   Essential hypertension 05/15/2019   History of stroke 05/15/2019   Homicidal ideation 05/15/2019   Hypothyroidism 05/15/2019   Constipation 05/06/2019   Scrotal mass 05/06/2019   Bipolar 1 disorder (HCC) 12/23/2018   Acute respiratory failure with hypoxia (HCC) 11/03/2018   Chronic pain of left knee 09/17/2018   Atypical chest pain 10/11/2017   Polypharmacy 08/05/2017   Cerebrovascular accident (CVA) due to stenosis of left middle cerebral artery (HCC) 06/26/2017   Urinary retention due to benign prostatic hyperplasia 06/22/2017   Falls 06/20/2017   Risk for falls 02/19/2017   Dyslipidemia 11/19/2016   Peripheral artery disease (HCC) 08/28/2016   Claudication (HCC) 08/09/2016   History of alcohol abuse 08/09/2016   History of drug dependence/abuse (HCC) 08/09/2016   Severe single current episode of major depressive disorder, without psychotic features (HCC) 08/09/2016   Mitral insufficiency 01/04/2016   Prediabetes 12/29/2015   Fracture of left clavicle 07/13/2012    Orientation RESPIRATION BLADDER Height & Weight     Self  Normal Incontinent Weight: 188 lb 7.9 oz (85.5 kg) Height:  5\' 9"  (175.3 cm) (per patient)  BEHAVIORAL SYMPTOMS/MOOD NEUROLOGICAL BOWEL NUTRITION STATUS      Continent Diet: cardiac  AMBULATORY STATUS COMMUNICATION OF NEEDS Skin   Limited Assist Verbally Normal                       Personal Care Assistance Level of Assistance  Bathing, Feeding, Dressing, Total care Bathing Assistance: Limited  assistance Feeding assistance: Independent Dressing Assistance: Limited assistance Total Care Assistance: Limited assistance   Functional Limitations Info  Sight, Speech, Hearing Sight Info: Impaired Hearing Info: Impaired Speech Info: Adequate    SPECIAL CARE FACTORS  FREQUENCY                       Contractures Contractures Info: Not present    Additional Factors Info  Code Status, Allergies Code Status Info: full Allergies Info: Iodinated Contrast Media   Penicillins   Strawberry Extract   Cefepime   Erythromycin   Sulfa Antibiotics              Discharge Medications:  acetaminophen 500 MG tablet Commonly known as: TYLENOL Take 500 mg by mouth every 6 (six) hours as needed for mild pain.    albuterol 108 (90 Base) MCG/ACT inhaler Commonly known as: VENTOLIN HFA Inhale 2 puffs into the lungs every 6 (six) hours as needed for wheezing. What changed: Another medication with the same name was added. Make sure you understand how and when to take each.    albuterol (2.5 MG/3ML) 0.083% nebulizer solution Commonly known as: PROVENTIL Take 3 mLs (2.5 mg total) by nebulization every 6 (six) hours as needed for wheezing or shortness of breath. What changed: You were already taking a medication with the same name, and this prescription was added. Make sure you understand how and when to take each.    atorvastatin 40 MG tablet Commonly known as: LIPITOR Take 1 tablet (40 mg total) by mouth daily.    carvedilol 3.125 MG tablet Commonly known as: COREG Take 3.125 mg by mouth 2 (two) times daily with a meal.    divalproex 500 MG 24 hr tablet Commonly known as: DEPAKOTE ER Take 1 tablet (500 mg total) by mouth 2 (two) times daily.    Eliquis 5 MG Tabs tablet Generic drug: apixaban TAKE 1 TABLET BY MOUTH TWICE A DAY    escitalopram 10 MG tablet Commonly known as: LEXAPRO Take 1 tablet (10 mg total) by mouth at bedtime.    feeding supplement Liqd Take 237 mLs by mouth 2 (two) times daily between meals.    furosemide 40 MG tablet Commonly known as: LASIX Take 1 tablet (40 mg total) by mouth daily.    Hydrocortisone Acetate 1 % Crea SMARTSIG:sparingly Topical 3 Times Daily    hydrOXYzine 10 MG tablet Commonly known as:  ATARAX Take 10 mg by mouth 2 (two) times daily.    levETIRAcetam 250 MG tablet Commonly known as: Keppra Take 1 tablet (250 mg total) by mouth 2 (two) times daily.    levothyroxine 25 MCG tablet Commonly known as: SYNTHROID Take 1 tablet (25 mcg total) by mouth daily.    lisinopril 2.5 MG tablet Commonly known as: ZESTRIL TAKE 1 TABLET BY MOUTH ONCE DAILY    omeprazole 20 MG capsule Commonly known as: PRILOSEC Take 20 mg by mouth daily.    QUEtiapine 200 MG tablet Commonly known as: SEROQUEL Take 200 mg by mouth at bedtime.    tiotropium 18 MCG inhalation capsule Commonly known as: SPIRIVA Place 18 mcg into inhaler and inhale daily.    traMADol 50 MG tablet Commonly known as: ULTRAM Take 50 mg by mouth 2 (two) times daily as needed.    Relevant Imaging Results:  Relevant Lab Results:   Additional Information    Gildardo Griffes, LCSW

## 2022-03-21 NOTE — Discharge Summary (Signed)
Physician Discharge Summary   Patient: Carl [name] MRN: [id number] DOB: 1961/02/26  Admit date:     03/14/2022  Discharge date:   Discharge Physician: Lurene Shadow   PCP: [facility]   Recommendations at discharge:   Follow up with PCP in 1 week  Discharge Diagnoses: Principal Problem:   COPD exacerbation (HCC) Active Problems:   Chest pain   Acute respiratory failure with hypoxia (HCC)   Acute on chronic systolic CHF (congestive heart failure) (HCC)   Bipolar 1 disorder (HCC)   Essential hypertension   Hypothyroidism   Dyslipidemia   Paroxysmal atrial fibrillation (HCC)   Seizure disorder (HCC)  Resolved Problems:   * No resolved hospital problems. [facility] Course:  [name] is a 61 y.o. male with medical history significant for bipolar disorder, chronic systolic CHF, COPD, depression, hypertension, coronary artery disease s/p NSTEMI, stroke and seizure disorder, who presented to the ER with acute onset of epigastric abdominal and left-sided chest pain when he was sitting outside in a wheelchair on the day of admission per EMS.  He was fairly confused in the ER and was experiencing flight of ideas.  He was recently evaluated for suicidal and homicidal ideation in the ER, sent to [facility] then to [facility].  He has been having dyspnea with associated wheezing as well as a cough with inability to expectorate.    He was admitted to the hospital for acute COPD exacerbation.  He was treated with IV steroids and bronchodilators.  He was also treated with IV Lasix for acute exacerbation of chronic systolic CHF.  Recent 2D echo showed EF estimated at 25 to 30%.  He developed acute hypoxic respiratory failure that required BiPAP.  He was successfully weaned off BiPAP and was transitioned to 2 L/min oxygen via nasal cannula.  His condition has improved and he has been successfully weaned off of oxygen to room air.  He had mildly elevated troponins that was  attributed to demand ischemia.  He also had acute metabolic encephalopathy that has resolved.  He was evaluated by PT who recommended home health therapy.  He is deemed stable for discharge to the assisted living facility today.         Consultants: Intensivist, palliative care Procedures performed: None  Disposition: Assisted living Diet recommendation:  Discharge Diet Orders (From admission, onward)     Start     Ordered   03/21/22 0000  Diet - low sodium heart healthy        03/21/22 1115           Cardiac diet DISCHARGE MEDICATION: Allergies as of 03/21/2022       Reactions   Iodinated Contrast Media Shortness Of Breath   Penicillins Anaphylaxis, Shortness Of Breath   Respiratory  Tolerated cefuroxime on 01/06/16   Strawberry Extract Anaphylaxis   Cefepime Itching   Empiric antibiotic, developed pruritis.    Erythromycin Itching   Sulfa Antibiotics Itching, Nausea And Vomiting, Nausea Only        Medication List     STOP taking these medications    Fluticasone-Salmeterol 250-50 MCG/DOSE Aepb Commonly known as: ADVAIR   predniSONE 10 MG tablet Commonly known as: DELTASONE       TAKE these medications    acetaminophen 500 MG tablet Commonly known as: TYLENOL Take 500 mg by mouth every 6 (six) hours as needed for mild pain.   albuterol 108 (90 Base) MCG/ACT inhaler Commonly known as: VENTOLIN HFA Inhale  2 puffs into the lungs every 6 (six) hours as needed for wheezing. What changed: Another medication with the same name was added. Make sure you understand how and when to take each.   albuterol (2.5 MG/3ML) 0.083% nebulizer solution Commonly known as: PROVENTIL Take 3 mLs (2.5 mg total) by nebulization every 6 (six) hours as needed for wheezing or shortness of breath. What changed: You were already taking a medication with the same name, and this prescription was added. Make sure you understand how and when to take each.   atorvastatin 40 MG  tablet Commonly known as: LIPITOR Take 1 tablet (40 mg total) by mouth daily.   carvedilol 3.125 MG tablet Commonly known as: COREG Take 3.125 mg by mouth 2 (two) times daily with a meal.   divalproex 500 MG 24 hr tablet Commonly known as: DEPAKOTE ER Take 1 tablet (500 mg total) by mouth 2 (two) times daily.   Eliquis 5 MG Tabs tablet Generic drug: apixaban TAKE 1 TABLET BY MOUTH TWICE A DAY   escitalopram 10 MG tablet Commonly known as: LEXAPRO Take 1 tablet (10 mg total) by mouth at bedtime.   feeding supplement Liqd Take 237 mLs by mouth 2 (two) times daily between meals.   furosemide 40 MG tablet Commonly known as: LASIX Take 1 tablet (40 mg total) by mouth daily.   Hydrocortisone Acetate 1 % Crea SMARTSIG:sparingly Topical 3 Times Daily   hydrOXYzine 10 MG tablet Commonly known as: ATARAX Take 10 mg by mouth 2 (two) times daily.   levETIRAcetam 250 MG tablet Commonly known as: Keppra Take 1 tablet (250 mg total) by mouth 2 (two) times daily.   levothyroxine 25 MCG tablet Commonly known as: SYNTHROID Take 1 tablet (25 mcg total) by mouth daily.   lisinopril 2.5 MG tablet Commonly known as: ZESTRIL TAKE 1 TABLET BY MOUTH ONCE DAILY   omeprazole 20 MG capsule Commonly known as: PRILOSEC Take 20 mg by mouth daily.   QUEtiapine 200 MG tablet Commonly known as: SEROQUEL Take 200 mg by mouth at bedtime.   tiotropium 18 MCG inhalation capsule Commonly known as: SPIRIVA Place 18 mcg into inhaler and inhale daily.   traMADol 50 MG tablet Commonly known as: ULTRAM Take 50 mg by mouth 2 (two) times daily as needed.               Durable Medical Equipment  (From admission, onward)           Start     Ordered   03/21/22 1110  DME Walker  Once       Question Answer Comment  Walker: With 5 Inch Wheels   Patient needs a walker to treat with the following condition General weakness      03/21/22 1115   03/21/22 0000  For home use only DME  Nebulizer machine       Question Answer Comment  Patient needs a nebulizer to treat with the following condition COPD exacerbation (HCC)   Length of Need Lifetime      03/21/22 1115   03/17/22 1150  For home use only DME Walker rolling  Once       Question Answer Comment  Walker: With 5 Inch Wheels   Patient needs a walker to treat with the following condition General weakness      03/17/22 1149            Discharge Exam: Filed Weights   03/14/22 1838 03/15/22 0041  Weight: 87 kg  85.5 kg   GEN: NAD SKIN: Warm and dry EYES: No pallor or icterus ENT: MMM CV: RRR PULM: Adequate air entry bilaterally, occasional wheezing but no rales heard  ABD: soft, ND, NT, +BS CNS: AAO x 3, non focal EXT: No edema or tenderness   Condition at discharge: good  The results of significant diagnostics from this hospitalization (including imaging, microbiology, ancillary and laboratory) are listed below for reference.   Imaging Studies: DG Chest Port 1 View  Result Date: 03/18/2022 CLINICAL DATA:  Shortness of breath EXAM: PORTABLE CHEST 1 VIEW COMPARISON:  Previous studies including the examination of 03/14/2022 FINDINGS: Transverse diameter of heart is increased. Central pulmonary vessels are more prominent. There is no focal pulmonary consolidation. There is no pleural effusion or pneumothorax. 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 Pacemaker/defibrillator battery is seen in the left infraclavicular region. Biventricular pacer leads are noted in place. There is previous internal fixation in left clavicle. IMPRESSION: Cardiomegaly. Central pulmonary vessels are more prominent suggesting CHF. There is no focal pulmonary consolidation. Electronically Signed   By: Ernie Avena M.D.   On: 03/18/2022 16:22   CT Abdomen Pelvis Wo Contrast  Result Date: 03/14/2022 CLINICAL DATA:  Abdominal pain. EXAM: CT ABDOMEN AND PELVIS WITHOUT CONTRAST TECHNIQUE: Multidetector CT imaging of the abdomen and  pelvis was performed following the standard protocol without IV contrast. RADIATION DOSE REDUCTION: This exam was performed according to the departmental dose-optimization program which includes automated exposure control, adjustment of the mA and/or kV according to patient size and/or use of iterative reconstruction technique. COMPARISON:  Abdominal ultrasound dated 02/16/2022. FINDINGS: Evaluation of this exam is limited in the absence of intravenous contrast. Lower chest: The visualized lung bases are clear. There is cardiomegaly. Mechanical mitral bowel. There is coronary vascular calcification. Cardiac pacemaker wires noted. No intra-abdominal free air or free fluid. Hepatobiliary: No focal liver abnormality is seen. No gallstones, gallbladder wall thickening, or biliary dilatation. Pancreas: Unremarkable. No pancreatic ductal dilatation or surrounding inflammatory changes. Spleen: Normal in size without focal abnormality. Adrenals/Urinary Tract: The adrenal glands are unremarkable. Probable punctate nonobstructing right renal calculi. No hydronephrosis. There is no hydronephrosis or nephrolithiasis on the left. There is a 1.3 cm partially exophytic hypodense lesion from the posterior upper pole of the left kidney which is not characterized on this noncontrast CT but demonstrates fluid attenuation, likely a cyst. The visualized ureters and urinary bladder appear unremarkable. Stomach/Bowel: There is no bowel obstruction or active inflammation. Several scattered colonic diverticula without active inflammatory changes. The appendix is normal. Vascular/Lymphatic: Advanced aortoiliac atherosclerotic disease. There is a 3.1 cm infrarenal abdominal aortic aneurysm. The IVC is unremarkable. No portal venous gas. There is no adenopathy. Reproductive: The prostate and seminal vesicles are grossly unremarkable. No pelvic mass. Other: None Musculoskeletal: No acute or significant osseous findings. IMPRESSION: 1. No acute  intra-abdominal or pelvic pathology. 2. Colonic diverticulosis. No bowel obstruction. Normal appendix. 3. A 3.1 cm infrarenal abdominal aortic aneurysm. Recommend follow-up ultrasound every 3 years. This recommendation follows ACR consensus guidelines: White Paper of the ACR Incidental Findings Committee II on Vascular Findings. J Am Coll Radiol 2013; 40:981-191. 4. Aortic Atherosclerosis (ICD10-I70.0). Electronically Signed   By: Elgie Collard M.D.   On: 03/14/2022 21:19   CT Head Wo Contrast  Result Date: 03/14/2022 CLINICAL DATA:  Mental status change, unknown cause EXAM: CT HEAD WITHOUT CONTRAST TECHNIQUE: Contiguous axial images were obtained from the base of the skull through the vertex without intravenous contrast. RADIATION DOSE REDUCTION: This exam  was performed according to the departmental dose-optimization program which includes automated exposure control, adjustment of the mA and/or kV according to patient size and/or use of iterative reconstruction technique. COMPARISON:  CT head 05/22/2021 BRAIN: BRAIN Patchy and confluent areas of decreased attenuation are noted throughout the deep and periventricular white matter of the cerebral hemispheres bilaterally, compatible with chronic microvascular ischemic disease. Redemonstration of left parieto-occipital and temporal encephalomalacia consistent with prior left middle cerebral artery infarction. No evidence of large-territorial acute infarction. No parenchymal hemorrhage. No mass lesion. No extra-axial collection. No mass effect or midline shift. No hydrocephalus. Basilar cisterns are patent. Vascular: No hyperdense vessel. Atherosclerotic calcifications are present within the cavernous internal carotid arteries. Skull: No acute fracture or focal lesion. Sinuses/Orbits: Complete opacification of the left maxillary sinus. Paranasal sinuses and mastoid air cells are clear. The orbits are unremarkable. Other: None. IMPRESSION: No acute intracranial  abnormality. Electronically Signed   By: Tish Frederickson M.D.   On: 03/14/2022 19:18   DG Chest 2 View  Result Date: 03/14/2022 CLINICAL DATA:  Shortness of breath. EXAM: CHEST - 2 VIEW COMPARISON:  02/15/2022 radiograph and CT FINDINGS: Stable cardiomegaly post median sternotomy. Prosthetic cardiac valve. Left-sided pacemaker in place. Mild chronic peribronchial thickening without focal airspace disease. No pleural effusion or pneumothorax. Left clavicle hardware. IMPRESSION: Stable cardiomegaly post median sternotomy and cardiac valve replacement. No acute chest finding. Electronically Signed   By: Narda Rutherford M.D.   On: 03/14/2022 19:13    Microbiology: Results for orders placed or performed during the hospital encounter of 03/14/22  Resp Panel by RT-PCR (Flu A&B, Covid) Anterior Nasal Swab     Status: None   Collection Time: 03/14/22 11:58 PM   Specimen: Anterior Nasal Swab  Result Value Ref Range Status   SARS Coronavirus 2 by RT PCR NEGATIVE NEGATIVE Final    Comment: (NOTE) SARS-CoV-2 target nucleic acids are NOT DETECTED.  The SARS-CoV-2 RNA is generally detectable in upper respiratory specimens during the acute phase of infection. The lowest concentration of SARS-CoV-2 viral copies this assay can detect is 138 copies/mL. A negative result does not preclude SARS-Cov-2 infection and should not be used as the sole basis for treatment or other patient management decisions. A negative result may occur with  improper specimen collection/handling, submission of specimen other than nasopharyngeal swab, presence of viral mutation(s) within the areas targeted by this assay, and inadequate number of viral copies(<138 copies/mL). A negative result must be combined with clinical observations, patient history, and epidemiological information. The expected result is Negative.  Fact Sheet for Patients:  BloggerCourse.com  Fact Sheet for Healthcare Providers:   SeriousBroker.it  This test is no t yet approved or cleared by the [place] FDA and  has been authorized for detection and/or diagnosis of SARS-CoV-2 by FDA under an Emergency Use Authorization (EUA). This EUA will remain  in effect (meaning this test can be used) for the duration of the COVID-19 declaration under Section 564(b)(1) of the Act, 21 U.S.C.section 360bbb-3(b)(1), unless the authorization is terminated  or revoked sooner.       Influenza A by PCR NEGATIVE NEGATIVE Final   Influenza B by PCR NEGATIVE NEGATIVE Final    Comment: (NOTE) The Xpert Xpress SARS-CoV-2/FLU/RSV plus assay is intended as an aid in the diagnosis of influenza from Nasopharyngeal swab specimens and should not be used as a sole basis for treatment. Nasal washings and aspirates are unacceptable for Xpert Xpress SARS-CoV-2/FLU/RSV testing.  Fact Sheet for Patients: BloggerCourse.com  Fact Sheet for Healthcare Providers: SeriousBroker.it  This test is not yet approved or cleared by the [place] FDA and has been authorized for detection and/or diagnosis of SARS-CoV-2 by FDA under an Emergency Use Authorization (EUA). This EUA will remain in effect (meaning this test can be used) for the duration of the COVID-19 declaration under Section 564(b)(1) of the Act, 21 U.S.C. section 360bbb-3(b)(1), unless the authorization is terminated or revoked.  Performed at [facility], [address] [place], [place] [address]   MRSA Next Gen by PCR, Nasal     Status: None   Collection Time: 03/18/22  7:39 PM   Specimen: Nasal Mucosa; Nasal Swab  Result Value Ref Range Status   MRSA by PCR Next Gen NOT DETECTED NOT DETECTED Final    Comment: (NOTE) The GeneXpert MRSA Assay (FDA approved for NASAL specimens only), is one component of a comprehensive MRSA colonization surveillance program. It is not intended to  diagnose MRSA infection nor to guide or monitor treatment for MRSA infections. Test performance is not FDA approved in patients less than 69 years old. Performed at [facility], [address] [place], [place] [address]     Labs: CBC: Recent Labs  Lab 03/15/22 0511 03/18/22 0501 03/19/22 0421 03/20/22 0419 03/21/22 0328  WBC 5.1 13.1* 10.4 10.2 9.4  HGB 11.8* 11.5* 12.3* 12.2* 12.2*  HCT 35.6* 35.0* 37.0* 36.7* 37.0*  MCV 88.1 87.7 87.3 87.2 86.9  PLT 207 272 263 258 258   Basic Metabolic Panel: Recent Labs  Lab 03/14/22 1638 03/15/22 0511 03/19/22 0421  NA 138 138 137  K 4.4 4.4 4.4  CL 107 108 96*  CO2 21* 21* 26  GLUCOSE 118* 280* 180*  BUN 18 17 62*  CREATININE 1.28* 1.10 1.24  CALCIUM 9.6 9.3 9.1  MG  --   --  2.4  PHOS  --   --  6.1*   Liver Function Tests: Recent Labs  Lab 03/14/22 1638  AST 40  ALT 42  ALKPHOS 82  BILITOT 0.6  PROT 6.9  ALBUMIN 3.3*   CBG: Recent Labs  Lab 03/20/22 1134 03/20/22 1616 03/20/22 2050 03/21/22 0819 03/21/22 1122  GLUCAP 222* 309* 284* 229* 251*    Discharge time spent: greater than 30 minutes.  Signed: Lurene Shadow, MD [facility] 03/21/2022

## 2022-03-21 NOTE — TOC Transition Note (Addendum)
Transition of Care [facility]) - CM/SW Discharge Note   Patient Details  Name: Carl [name] MRN: [id number] Date of Birth: Apr 01, 1961  [facility] CM/SW Contact:  Gildardo Griffes, LCSW Phone Number: 03/21/2022, 11:16 AM   Clinical Narrative:     Patient to dc to [facility] today, CSW talked to [facility] at [phone] who is agreeable for patient to return he will pick him up at 2pm at [facility] main entrance. RN made aware of above, Doristine Mango will call RN when he is here.   CSW has ordered RW and nebulizer from [employer] to be delivered to patient's bedside.   Patient is active with [facility] they are able to resume and are aware of patient's dc today.   DC summary and fl2 to be faxed to [phone].   CSW has lvm with Melburn Hake with DSS to inform of patient dc today.    Final next level of care: Assisted Living Barriers to Discharge: No Barriers Identified   Patient Goals and CMS Choice Patient states their goals for this hospitalization and ongoing recovery are:: to go home CMS Medicare.gov Compare Post Acute Care list provided to:: Patient Choice offered to / list presented to : Patient  Discharge Placement                       Discharge Plan and Services                DME Arranged: Walker rolling, Nebulizer machine DME Agency: [employer] Date DME Agency Contacted: 03/21/22 Time DME Agency Contacted: 1115 Representative spoke with at DME Agency: Zack            Social Determinants of Health (SDOH) Interventions     Readmission Risk Interventions    03/15/2022    2:44 PM  Readmission Risk Prevention Plan  HRI or Home Care Consult Complete  Social Work Consult for Recovery Care Planning/Counseling Complete  Palliative Care Screening Not Applicable  Medication Review [employer]) Complete

## 2022-03-22 ENCOUNTER — Emergency Department: Payer: Medicaid Other

## 2022-03-22 ENCOUNTER — Encounter: Payer: Self-pay | Admitting: Internal Medicine

## 2022-03-22 ENCOUNTER — Inpatient Hospital Stay
Admission: EM | Admit: 2022-03-22 | Discharge: 2022-04-01 | DRG: 177 | Disposition: A | Discharge status: Home Health | Payer: Medicaid Other | Attending: Osteopathic Medicine | Admitting: Osteopathic Medicine

## 2022-03-22 DIAGNOSIS — U071 COVID-19: Secondary | ICD-10-CM | POA: Diagnosis not present

## 2022-03-22 DIAGNOSIS — E039 Hypothyroidism, unspecified: Secondary | ICD-10-CM | POA: Diagnosis present

## 2022-03-22 DIAGNOSIS — R0902 Hypoxemia: Secondary | ICD-10-CM

## 2022-03-22 DIAGNOSIS — F17210 Nicotine dependence, cigarettes, uncomplicated: Secondary | ICD-10-CM | POA: Diagnosis present

## 2022-03-22 DIAGNOSIS — Z79899 Other long term (current) drug therapy: Secondary | ICD-10-CM

## 2022-03-22 DIAGNOSIS — E1165 Type 2 diabetes mellitus with hyperglycemia: Secondary | ICD-10-CM | POA: Diagnosis present

## 2022-03-22 DIAGNOSIS — I480 Paroxysmal atrial fibrillation: Secondary | ICD-10-CM | POA: Diagnosis present

## 2022-03-22 DIAGNOSIS — Z87898 Personal history of other specified conditions: Secondary | ICD-10-CM

## 2022-03-22 DIAGNOSIS — J9620 Acute and chronic respiratory failure, unspecified whether with hypoxia or hypercapnia: Secondary | ICD-10-CM

## 2022-03-22 DIAGNOSIS — J9601 Acute respiratory failure with hypoxia: Secondary | ICD-10-CM | POA: Diagnosis not present

## 2022-03-22 DIAGNOSIS — I255 Ischemic cardiomyopathy: Secondary | ICD-10-CM | POA: Diagnosis present

## 2022-03-22 DIAGNOSIS — N1831 Chronic kidney disease, stage 3a: Secondary | ICD-10-CM | POA: Diagnosis not present

## 2022-03-22 DIAGNOSIS — Z7901 Long term (current) use of anticoagulants: Secondary | ICD-10-CM | POA: Diagnosis not present

## 2022-03-22 DIAGNOSIS — Z8042 Family history of malignant neoplasm of prostate: Secondary | ICD-10-CM

## 2022-03-22 DIAGNOSIS — Z6826 Body mass index (BMI) 26.0-26.9, adult: Secondary | ICD-10-CM

## 2022-03-22 DIAGNOSIS — I248 Other forms of acute ischemic heart disease: Secondary | ICD-10-CM | POA: Diagnosis present

## 2022-03-22 DIAGNOSIS — I5023 Acute on chronic systolic (congestive) heart failure: Secondary | ICD-10-CM | POA: Diagnosis present

## 2022-03-22 DIAGNOSIS — I5020 Unspecified systolic (congestive) heart failure: Secondary | ICD-10-CM

## 2022-03-22 DIAGNOSIS — J441 Chronic obstructive pulmonary disease with (acute) exacerbation: Secondary | ICD-10-CM | POA: Diagnosis not present

## 2022-03-22 DIAGNOSIS — I2489 Other forms of acute ischemic heart disease: Secondary | ICD-10-CM | POA: Diagnosis present

## 2022-03-22 DIAGNOSIS — R7989 Other specified abnormal findings of blood chemistry: Secondary | ICD-10-CM | POA: Diagnosis present

## 2022-03-22 DIAGNOSIS — Z91041 Radiographic dye allergy status: Secondary | ICD-10-CM

## 2022-03-22 DIAGNOSIS — I130 Hypertensive heart and chronic kidney disease with heart failure and stage 1 through stage 4 chronic kidney disease, or unspecified chronic kidney disease: Secondary | ICD-10-CM | POA: Diagnosis present

## 2022-03-22 DIAGNOSIS — I959 Hypotension, unspecified: Secondary | ICD-10-CM | POA: Diagnosis not present

## 2022-03-22 DIAGNOSIS — Z882 Allergy status to sulfonamides status: Secondary | ICD-10-CM

## 2022-03-22 DIAGNOSIS — Z8673 Personal history of transient ischemic attack (TIA), and cerebral infarction without residual deficits: Secondary | ICD-10-CM

## 2022-03-22 DIAGNOSIS — Z951 Presence of aortocoronary bypass graft: Secondary | ICD-10-CM | POA: Diagnosis not present

## 2022-03-22 DIAGNOSIS — E785 Hyperlipidemia, unspecified: Secondary | ICD-10-CM | POA: Diagnosis present

## 2022-03-22 DIAGNOSIS — R778 Other specified abnormalities of plasma proteins: Secondary | ICD-10-CM | POA: Diagnosis not present

## 2022-03-22 DIAGNOSIS — I10 Essential (primary) hypertension: Secondary | ICD-10-CM | POA: Diagnosis present

## 2022-03-22 DIAGNOSIS — D72829 Elevated white blood cell count, unspecified: Secondary | ICD-10-CM | POA: Diagnosis present

## 2022-03-22 DIAGNOSIS — Z880 Allergy status to penicillin: Secondary | ICD-10-CM

## 2022-03-22 DIAGNOSIS — Z79890 Hormone replacement therapy (postmenopausal): Secondary | ICD-10-CM

## 2022-03-22 DIAGNOSIS — I4819 Other persistent atrial fibrillation: Secondary | ICD-10-CM | POA: Diagnosis present

## 2022-03-22 DIAGNOSIS — I420 Dilated cardiomyopathy: Secondary | ICD-10-CM | POA: Diagnosis not present

## 2022-03-22 DIAGNOSIS — E663 Overweight: Secondary | ICD-10-CM | POA: Diagnosis present

## 2022-03-22 DIAGNOSIS — E1122 Type 2 diabetes mellitus with diabetic chronic kidney disease: Secondary | ICD-10-CM | POA: Diagnosis present

## 2022-03-22 DIAGNOSIS — I2510 Atherosclerotic heart disease of native coronary artery without angina pectoris: Secondary | ICD-10-CM | POA: Diagnosis present

## 2022-03-22 DIAGNOSIS — R569 Unspecified convulsions: Secondary | ICD-10-CM

## 2022-03-22 DIAGNOSIS — I252 Old myocardial infarction: Secondary | ICD-10-CM | POA: Diagnosis not present

## 2022-03-22 DIAGNOSIS — I2583 Coronary atherosclerosis due to lipid rich plaque: Secondary | ICD-10-CM | POA: Diagnosis not present

## 2022-03-22 DIAGNOSIS — F319 Bipolar disorder, unspecified: Secondary | ICD-10-CM | POA: Diagnosis present

## 2022-03-22 DIAGNOSIS — I447 Left bundle-branch block, unspecified: Secondary | ICD-10-CM | POA: Diagnosis present

## 2022-03-22 DIAGNOSIS — G40909 Epilepsy, unspecified, not intractable, without status epilepticus: Secondary | ICD-10-CM | POA: Diagnosis present

## 2022-03-22 DIAGNOSIS — Z95810 Presence of automatic (implantable) cardiac defibrillator: Secondary | ICD-10-CM

## 2022-03-22 DIAGNOSIS — I509 Heart failure, unspecified: Secondary | ICD-10-CM

## 2022-03-22 DIAGNOSIS — R0602 Shortness of breath: Secondary | ICD-10-CM | POA: Diagnosis present

## 2022-03-22 DIAGNOSIS — J9690 Respiratory failure, unspecified, unspecified whether with hypoxia or hypercapnia: Secondary | ICD-10-CM | POA: Diagnosis present

## 2022-03-22 DIAGNOSIS — E7800 Pure hypercholesterolemia, unspecified: Secondary | ICD-10-CM | POA: Diagnosis not present

## 2022-03-22 DIAGNOSIS — Z881 Allergy status to other antibiotic agents status: Secondary | ICD-10-CM

## 2022-03-22 DIAGNOSIS — N170 Acute kidney failure with tubular necrosis: Secondary | ICD-10-CM | POA: Diagnosis not present

## 2022-03-22 DIAGNOSIS — N179 Acute kidney failure, unspecified: Secondary | ICD-10-CM | POA: Diagnosis present

## 2022-03-22 LAB — CBC WITH DIFFERENTIAL/PLATELET
Abs Immature Granulocytes: 2.97 10*3/uL — ABNORMAL HIGH (ref 0.00–0.07)
Basophils Absolute: 0 10*3/uL (ref 0.0–0.1)
Basophils Relative: 0 %
Eosinophils Absolute: 0 10*3/uL (ref 0.0–0.5)
Eosinophils Relative: 0 %
HCT: 48.2 % (ref 39.0–52.0)
Hemoglobin: 15.1 g/dL (ref 13.0–17.0)
Immature Granulocytes: 13 %
Lymphocytes Relative: 20 %
Lymphs Abs: 4.5 10*3/uL — ABNORMAL HIGH (ref 0.7–4.0)
MCH: 28.4 pg (ref 26.0–34.0)
MCHC: 31.3 g/dL (ref 30.0–36.0)
MCV: 90.8 fL (ref 80.0–100.0)
Monocytes Absolute: 1.8 10*3/uL — ABNORMAL HIGH (ref 0.1–1.0)
Monocytes Relative: 8 %
Neutro Abs: 13.3 10*3/uL — ABNORMAL HIGH (ref 1.7–7.7)
Neutrophils Relative %: 59 %
Platelets: 339 10*3/uL (ref 150–400)
RBC: 5.31 MIL/uL (ref 4.22–5.81)
RDW: 18.6 % — ABNORMAL HIGH (ref 11.5–15.5)
Smear Review: NORMAL
WBC: 22.6 10*3/uL — ABNORMAL HIGH (ref 4.0–10.5)
nRBC: 4.4 % — ABNORMAL HIGH (ref 0.0–0.2)

## 2022-03-22 LAB — SARS CORONAVIRUS 2 BY RT PCR: SARS Coronavirus 2 by RT PCR: POSITIVE — AB

## 2022-03-22 LAB — BASIC METABOLIC PANEL
Anion gap: 19 — ABNORMAL HIGH (ref 5–15)
BUN: 49 mg/dL — ABNORMAL HIGH (ref 8–23)
CO2: 20 mmol/L — ABNORMAL LOW (ref 22–32)
Calcium: 9.5 mg/dL (ref 8.9–10.3)
Chloride: 97 mmol/L — ABNORMAL LOW (ref 98–111)
Creatinine, Ser: 1.63 mg/dL — ABNORMAL HIGH (ref 0.61–1.24)
GFR, Estimated: 47 mL/min — ABNORMAL LOW (ref >60)
Glucose, Bld: 234 mg/dL — ABNORMAL HIGH (ref 70–99)
Potassium: 5.3 mmol/L — ABNORMAL HIGH (ref 3.5–5.1)
Sodium: 136 mmol/L (ref 135–145)

## 2022-03-22 LAB — TROPONIN I (HIGH SENSITIVITY)
Troponin I (High Sensitivity): 197 ng/L (ref <18)
Troponin I (High Sensitivity): 677 ng/L (ref <18)
Troponin I (High Sensitivity): 1543 ng/L (ref <18)
Troponin I (High Sensitivity): 1438 ng/L (ref <18)

## 2022-03-22 LAB — PROCALCITONIN: Procalcitonin: <0.1 ng/mL

## 2022-03-22 LAB — BRAIN NATRIURETIC PEPTIDE: B Natriuretic Peptide: 689.8 pg/mL — ABNORMAL HIGH (ref 0.0–100.0)

## 2022-03-22 MED ORDER — APIXABAN 5 MG PO TABS
5.0000 mg | ORAL_TABLET | Freq: Two times a day (BID) | ORAL | Status: DC
  Administered 2022-03-22 – 2022-03-23 (×2): 5 mg via ORAL
  Filled 2022-03-22 (×2): qty 1

## 2022-03-22 MED ORDER — HEPARIN (PORCINE) 25000 UT/250ML-% IV SOLN
1100.0000 [IU]/h | INTRAVENOUS | Status: DC
  Administered 2022-03-22: 1100 [IU]/h via INTRAVENOUS

## 2022-03-22 MED ORDER — HEPARIN BOLUS VIA INFUSION
4000.0000 [IU] | Freq: Once | INTRAVENOUS | Status: AC
  Administered 2022-03-22: 4000 [IU] via INTRAVENOUS
  Filled 2022-03-22: qty 4000

## 2022-03-22 MED ORDER — QUETIAPINE FUMARATE 200 MG PO TABS
200.0000 mg | ORAL_TABLET | Freq: Every day | ORAL | Status: DC
  Administered 2022-03-22 – 2022-03-31 (×10): 200 mg via ORAL
  Filled 2022-03-22 (×4): qty 1
  Filled 2022-03-22: qty 8
  Filled 2022-03-22: qty 1
  Filled 2022-03-22: qty 8
  Filled 2022-03-22 (×4): qty 1

## 2022-03-22 MED ORDER — IPRATROPIUM-ALBUTEROL 0.5-2.5 (3) MG/3ML IN SOLN
3.0000 mL | Freq: Once | RESPIRATORY_TRACT | Status: AC
  Administered 2022-03-22: 3 mL via RESPIRATORY_TRACT
  Filled 2022-03-22: qty 3

## 2022-03-22 MED ORDER — SODIUM CHLORIDE 0.45 % IV SOLN: INTRAVENOUS | Status: AC

## 2022-03-22 MED ORDER — LORAZEPAM 2 MG/ML IJ SOLN: 0.5000 mg | Freq: Once | INTRAMUSCULAR | Status: AC

## 2022-03-22 MED ORDER — PANTOPRAZOLE SODIUM 40 MG PO TBEC
80.0000 mg | DELAYED_RELEASE_TABLET | Freq: Every day | ORAL | Status: DC
  Administered 2022-03-23 – 2022-04-01 (×10): 80 mg via ORAL
  Filled 2022-03-22 (×10): qty 2

## 2022-03-22 MED ORDER — ENSURE ENLIVE PO LIQD
237.0000 mL | Freq: Two times a day (BID) | ORAL | Status: DC
  Administered 2022-03-23 – 2022-03-25 (×3): 237 mL via ORAL

## 2022-03-22 MED ORDER — HYDROXYZINE HCL 10 MG PO TABS
10.0000 mg | ORAL_TABLET | Freq: Two times a day (BID) | ORAL | Status: DC
  Administered 2022-03-22 – 2022-04-01 (×20): 10 mg via ORAL
  Filled 2022-03-22 (×21): qty 1

## 2022-03-22 MED ORDER — FUROSEMIDE 10 MG/ML IJ SOLN
40.0000 mg | Freq: Three times a day (TID) | INTRAMUSCULAR | Status: AC
  Administered 2022-03-22 – 2022-03-23 (×3): 40 mg via INTRAVENOUS
  Filled 2022-03-22 (×3): qty 4

## 2022-03-22 MED ORDER — MAGNESIUM SULFATE 2 GM/50ML IV SOLN
2.0000 g | Freq: Once | INTRAVENOUS | Status: AC
  Administered 2022-03-22: 2 g via INTRAVENOUS
  Filled 2022-03-22: qty 50

## 2022-03-22 MED ORDER — LEVOTHYROXINE SODIUM 25 MCG PO TABS
25.0000 ug | ORAL_TABLET | Freq: Every day | ORAL | Status: DC
  Administered 2022-03-23 – 2022-04-01 (×10): 25 ug via ORAL
  Filled 2022-03-22 (×10): qty 1

## 2022-03-22 MED ORDER — LEVOFLOXACIN IN D5W 500 MG/100ML IV SOLN
500.0000 mg | Freq: Once | INTRAVENOUS | Status: AC
  Administered 2022-03-22: 500 mg via INTRAVENOUS
  Filled 2022-03-22: qty 100

## 2022-03-22 MED ORDER — LEVETIRACETAM 250 MG PO TABS
250.0000 mg | ORAL_TABLET | Freq: Two times a day (BID) | ORAL | Status: DC
  Administered 2022-03-22 – 2022-04-01 (×20): 250 mg via ORAL
  Filled 2022-03-22 (×21): qty 1

## 2022-03-22 MED ORDER — TRAMADOL HCL 50 MG PO TABS
50.0000 mg | ORAL_TABLET | Freq: Two times a day (BID) | ORAL | Status: DC | PRN
  Administered 2022-03-26: 50 mg via ORAL
  Filled 2022-03-22: qty 1

## 2022-03-22 MED ORDER — CARVEDILOL 3.125 MG PO TABS
3.1250 mg | ORAL_TABLET | Freq: Two times a day (BID) | ORAL | Status: DC
  Administered 2022-03-22 – 2022-03-23 (×2): 3.125 mg via ORAL
  Filled 2022-03-22 (×2): qty 1

## 2022-03-22 MED ORDER — IPRATROPIUM BROMIDE 0.02 % IN SOLN
1.0000 mg | Freq: Once | RESPIRATORY_TRACT | Status: AC
  Administered 2022-03-22: 1 mg via RESPIRATORY_TRACT
  Filled 2022-03-22: qty 5

## 2022-03-22 MED ORDER — LISINOPRIL 5 MG PO TABS
2.5000 mg | ORAL_TABLET | Freq: Every day | ORAL | Status: DC
  Administered 2022-03-23: 2.5 mg via ORAL
  Filled 2022-03-22: qty 1

## 2022-03-22 MED ORDER — TIOTROPIUM BROMIDE MONOHYDRATE 18 MCG IN CAPS
18.0000 ug | ORAL_CAPSULE | Freq: Every day | RESPIRATORY_TRACT | Status: DC
  Administered 2022-03-23 – 2022-04-01 (×10): 18 ug via RESPIRATORY_TRACT
  Filled 2022-03-22 (×2): qty 5

## 2022-03-22 MED ORDER — ESCITALOPRAM OXALATE 10 MG PO TABS
10.0000 mg | ORAL_TABLET | Freq: Every day | ORAL | Status: DC
  Administered 2022-03-22 – 2022-03-31 (×10): 10 mg via ORAL
  Filled 2022-03-22 (×10): qty 1

## 2022-03-22 MED ORDER — ALBUTEROL SULFATE (2.5 MG/3ML) 0.083% IN NEBU
10.0000 mg | INHALATION_SOLUTION | Freq: Once | RESPIRATORY_TRACT | Status: AC
  Administered 2022-03-22: 10 mg via RESPIRATORY_TRACT
  Filled 2022-03-22: qty 12

## 2022-03-22 MED ORDER — ALBUTEROL SULFATE (2.5 MG/3ML) 0.083% IN NEBU
2.5000 mg | INHALATION_SOLUTION | Freq: Four times a day (QID) | RESPIRATORY_TRACT | Status: DC | PRN
  Administered 2022-03-23 – 2022-03-28 (×3): 2.5 mg via RESPIRATORY_TRACT
  Filled 2022-03-22 (×3): qty 3

## 2022-03-22 MED ORDER — ACETAMINOPHEN 500 MG PO TABS: 500.0000 mg | ORAL_TABLET | Freq: Four times a day (QID) | ORAL | Status: DC | PRN

## 2022-03-22 MED ORDER — FUROSEMIDE 10 MG/ML IJ SOLN
40.0000 mg | Freq: Once | INTRAMUSCULAR | Status: AC
  Administered 2022-03-22: 40 mg via INTRAVENOUS
  Filled 2022-03-22: qty 4

## 2022-03-22 MED ORDER — DIVALPROEX SODIUM ER 500 MG PO TB24
500.0000 mg | ORAL_TABLET | Freq: Two times a day (BID) | ORAL | Status: DC
  Administered 2022-03-23 – 2022-04-01 (×20): 500 mg via ORAL
  Filled 2022-03-22 (×20): qty 1

## 2022-03-22 MED ORDER — METHYLPREDNISOLONE SODIUM SUCC 125 MG IJ SOLR
125.0000 mg | Freq: Once | INTRAMUSCULAR | Status: AC
  Administered 2022-03-22: 125 mg via INTRAVENOUS
  Filled 2022-03-22: qty 2

## 2022-03-22 MED ORDER — LORAZEPAM 2 MG/ML IJ SOLN
INTRAMUSCULAR | Status: AC
  Administered 2022-03-22: 0.5 mg via INTRAVENOUS
  Filled 2022-03-22: qty 1

## 2022-03-22 MED ORDER — ATORVASTATIN CALCIUM 20 MG PO TABS
40.0000 mg | ORAL_TABLET | Freq: Every day | ORAL | Status: DC
  Administered 2022-03-23 – 2022-03-26 (×4): 40 mg via ORAL
  Filled 2022-03-22 (×4): qty 2

## 2022-03-22 NOTE — Assessment & Plan Note (Addendum)
Baseline around 1.1.  Creatinine briefly rose, then improved, despite diuresis.  Resolved by 9/19

## 2022-03-22 NOTE — Assessment & Plan Note (Signed)
Patient admitted 9/7-9/14 with COPD exacerbation that required BiPAP. Now with recurrent hypoxemia requiring BiPAP. Wheezing present on exam. No clear evidence PNA. Cardiac asthma a possibility. In ED received solumedrol 125mg .   Plan Continue home regimen of Albuterol Nebs and Spiriva  Keep O2 sats > 88%, wean BiPAP as possible

## 2022-03-22 NOTE — Assessment & Plan Note (Signed)
Patient with psych issues. Has also suffered multifactorial encephalopathy. Social Worker states that he is under guardianship and has been deemed incompetent to make medical decisions. Therefore, DSS needs to grant consent for any procedures contemplated. The social worker also stated that he is not competent to make a decision regarding Code status although the patient states he would want resuscitation.  Plan Continue current psych meds  Continue full code status per patient's request until DSS issues a code status order on his behalf.

## 2022-03-22 NOTE — TOC Initial Note (Signed)
Transition of Care [facility]) - Initial/Assessment Note    Patient Details  Name: Carl [name] MRN: [id number] Date of Birth: Jul 03, 1961  Transition of Care [facility]) CM/SW Contact:    Allayne Butcher, RN Phone Number: 03/22/2022, 12:07 PM  Clinical Narrative:                 Patient just discharged from the hospital yesterday for COPD exacerbation, admitted again today for same.  Patient does not wear oxygen at home, he is from [facility], Achilles Dunk is the owner [phone].  Guardian is [place] [facility]PS, SW is [name] [phone].  Patient was discharged yesterday with a walker and nebulizer.  Patient requiring Bipap while in the ED.  TOC will follow.    Expected Discharge Plan: Assisted Living Barriers to Discharge: Continued Medical Work up   Patient Goals and CMS Choice        Expected Discharge Plan and Services Expected Discharge Plan: Assisted Living   Discharge Planning Services: CM Consult   Living arrangements for the past 2 months: Assisted Living Facility                                      Prior Living Arrangements/Services Living arrangements for the past 2 months: Assisted Living Facility Lives with:: Facility Resident Patient language and need for interpreter reviewed:: Yes        Need for Family Participation in Patient Care: Yes (Comment) Care giver support system in place?: Yes (comment) Current home services: DME (wheelchair, walker, nebulizer) Criminal Activity/Legal Involvement Pertinent to Current Situation/Hospitalization: No - Comment as needed  Activities of Daily Living      Permission Sought/Granted Permission sought to share information with : Guardian    Share Information with NAME: Achilles Dunk- Group home owner [phone]  Permission granted to share info w AGENCY: [place] DSS Guardian SW [name]- [phone]        Emotional Assessment         Alcohol / Substance Use: Not Applicable Psych  Involvement: Outpatient Provider  Admission diagnosis:  Shortness of Breath Patient Active Problem List   Diagnosis Date Noted   Chest pain    COPD exacerbation (HCC) 03/14/2022   Seizure disorder (HCC) 03/14/2022   Dyspnea    Acute exacerbation of CHF (congestive heart failure) (HCC) 02/15/2022   Anxiety 02/15/2022   Tobacco use 02/15/2022   Elevated partial thromboplastin time (PTT) 02/15/2022   Abdominal pain 02/15/2022   HFrEF (heart failure with reduced ejection fraction) (HCC)    Weakness    COPD with acute exacerbation (HCC) 05/23/2021   Paroxysmal atrial fibrillation (HCC) 05/23/2021   Chronic anticoagulation 05/23/2021   Abdominal aortic aneurysm (AAA) 35 to 39 mm in diameter (HCC) 05/23/2021   Ischemic cardiomyopathy 05/22/2020   Chronic systolic heart failure (HCC) 05/22/2020   HCAP (healthcare-associated pneumonia) 01/24/2020   Hyperkalemia 01/24/2020   Acute on chronic combined systolic (congestive) and diastolic (congestive) heart failure (HCC) 01/24/2020   Seizure (HCC) 01/24/2020   HLD (hyperlipidemia) 01/24/2020   Ascites due to alcoholic cirrhosis (HCC)    Acute on chronic systolic CHF (congestive heart failure) (HCC) 11/18/2019   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/18/2019   Elevated troponin 11/18/2019   Hypoxia 11/18/2019   Acute renal failure superimposed on stage 3a chronic kidney disease (HCC) 11/18/2019   Coronary artery disease 05/15/2019   Essential hypertension 05/15/2019   History of stroke  05/15/2019   Homicidal ideation 05/15/2019   Hypothyroidism 05/15/2019   Constipation 05/06/2019   Scrotal mass 05/06/2019   Bipolar 1 disorder (HCC) 12/23/2018   Acute respiratory failure with hypoxia (HCC) 11/03/2018   Chronic pain of left knee 09/17/2018   Atypical chest pain 10/11/2017   Polypharmacy 08/05/2017   Cerebrovascular accident (CVA) due to stenosis of left middle cerebral artery (HCC) 06/26/2017   Urinary retention due to  benign prostatic hyperplasia 06/22/2017   Falls 06/20/2017   Risk for falls 02/19/2017   Dyslipidemia 11/19/2016   Peripheral artery disease (HCC) 08/28/2016   Claudication (HCC) 08/09/2016   History of alcohol abuse 08/09/2016   History of drug dependence/abuse (HCC) 08/09/2016   Severe single current episode of major depressive disorder, without psychotic features (HCC) 08/09/2016   Mitral insufficiency 01/04/2016   Prediabetes 12/29/2015   Fracture of left clavicle 07/13/2012   PCP:  [facility]:   [facility] - [place], [place] - [address] [place] [address] Phone: [phone] Fax: [phone]     Social Determinants of Health (SDOH) Interventions    Readmission Risk Interventions    03/15/2022    2:44 PM  Readmission Risk Prevention Plan  HRI or Home Care Consult Complete  Social Work Consult for Recovery Care Planning/Counseling Complete  Palliative Care Screening Not Applicable  Medication Review Ocean[facility]) Complete

## 2022-03-22 NOTE — ED Notes (Signed)
Pt with large, soft bowel movement noted when attempting to place a purwic. Pt cleaned and changed, bedding changed, purwic applied. Mild redness noted to pts testicles. Pt SOB appears to be improving with RR decreasing and pt being able to speak 2-3 words between breaths, unlike before. MD notified.

## 2022-03-22 NOTE — Assessment & Plan Note (Addendum)
Patient in fib/flutter on EKG. Eliquis restarted 9/19 after 48 hours of IV heparin.  Beta-blocker restarted.  Digoxin added.

## 2022-03-22 NOTE — Consult Note (Signed)
Cardiology Consultation   Patient ID: Carl [name] MRN: [id number]; DOB: [birth year]  Admit date: 03/22/2022 Date of Consult: 03/22/2022  PCP:  Orvis Brill, [place] [facility] Cardiologist:  Kathlyn Sacramento, MD  Electrophysiologist:  Virl Axe, MD  {   Patient Profile:   Carl [name] is a 61 y.o. male with a hx of CAD s/p CABG in 2017, ICM, EF30-35%, s/p ICD 2017, COPD, paroxysmal Afib on Eliquis, CVA, bipolar disorder, current smoker who is being seen 03/22/2022 for the evaluation of CHF at the request of Dr. Cinda Quest.  History of Present Illness:   Mr. Menze was hospitalized in May 2021 with respiratory distress in the setting of COPD and heart failure. Echo showed LVEF 25-30%. He underwent Myoview Lexiscan which showed evidence of prior infarct with no ischemia. He was hospitalized in July 2021 for sepsis secondary to PNA. He underwent BiV ICD upgrade by Dr. Caryl Comes in October 2021. Echo in February 2022 showed an EF of 30-35% with mild to mod MR.   He was hospitalized in August 2023 with shortness of breath and hypoxiz, brought via EMS. BNP 888 and troponin was 68. He was treated with IV heparin and IV lasix. Echo showed LVEF 25-30%, elevated LVEDP, severely dilated LA, mild MR.   The patient was discharged from [facility] 03/06/22 for suicidal and homicidal ideation.  He was readmitted early September for COPD exacerbation. He was discharged 03/21/22.  The patient came back to the hospital 03/22/22 for shortness of breath. History is hard to obtain given Bipap. He was brought via EMS on NRB and was given 1 albuterol treatment. He denies chest pain. He was also given Solu-medrol, mag and DuoNeb.  In the ER BP 129/72, pulse 84, RR 49, afebrile. Labs showed WBC 22.6, K 5.3, CO2 20, BG 234, Scr 1.63, BUN 49. BNP 689, pH arterial 7.31, pO2 206, Bicarb 19.1. HS troponin 197. EKG afib with a heart rate of 140s. CXR shows pulmonary  vascular congestion. He was given IV lasix and admitted.    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 Authorizing Provider  acetaminophen (TYLENOL) 500 MG tablet Take 500 mg by mouth every 6 (six) hours as needed for mild pain.   Yes [provider]  albuterol (PROVENTIL) (2.5 MG/3ML) 0.083% nebulizer solution Take 3 mLs (2.5 mg total) by nebulization every 6 (six) hours as needed for wheezing or shortness of breath. 03/21/22  Yes Jennye Boroughs, MD  albuterol (VENTOLIN HFA) 108 (90 Base) MCG/ACT inhaler  Inhale 2 puffs into the lungs every 6 (six) hours as needed for wheezing. 05/24/19 04/28/22 Yes Clapacs, Jackquline Denmark, MD  apixaban (ELIQUIS) 5 MG TABS tablet TAKE 1 TABLET BY MOUTH TWICE A DAY 01/18/21  Yes Duke Salvia, MD  atorvastatin (LIPITOR) 40 MG tablet Take 1 tablet (40 mg total) by mouth daily. 09/20/21  Yes Iran Ouch, MD  carvedilol (COREG) 3.125 MG tablet Take 3.125 mg by mouth 2 (two) times daily with a meal.   Yes [provider]  divalproex (DEPAKOTE ER) 500 MG 24 hr tablet Take 1 tablet (500 mg total) by mouth 2 (two) times daily.  05/24/19  Yes Clapacs, Jackquline Denmark, MD  escitalopram (LEXAPRO) 10 MG tablet Take 1 tablet (10 mg total) by mouth at bedtime. 05/24/19  Yes Clapacs, Jackquline Denmark, MD  feeding supplement (ENSURE ENLIVE / ENSURE PLUS) LIQD Take 237 mLs by mouth 2 (two) times daily between meals. 05/26/21  Yes Wieting, Richard, MD  furosemide (LASIX) 40 MG tablet Take 1 tablet (40 mg total) by mouth daily. 08/01/20  Yes Duke Salvia, MD  Hydrocortisone Acetate 1 % CREA SMARTSIG:sparingly Topical 3 Times Daily 12/24/21  Yes [provider]  hydrOXYzine (ATARAX) 10 MG tablet Take 10 mg by mouth 2 (two) times daily. 12/24/21  Yes [provider]  levETIRAcetam (KEPPRA) 250 MG tablet Take 1 tablet (250 mg total) by mouth 2 (two) times daily. 03/24/20  Yes Minna Antis, MD  levothyroxine (SYNTHROID) 25 MCG tablet Take 1 tablet (25 mcg total) by mouth daily. 05/24/19  Yes Clapacs, Jackquline Denmark, MD  lisinopril (ZESTRIL) 2.5 MG tablet TAKE 1 TABLET BY MOUTH ONCE DAILY 01/21/21  Yes Alver Sorrow, NP  omeprazole (PRILOSEC) 20 MG capsule Take 20 mg by mouth daily.   Yes [provider]  QUEtiapine (SEROQUEL) 200 MG tablet Take 200 mg by mouth at bedtime.   Yes [provider]  tiotropium (SPIRIVA) 18 MCG inhalation capsule Place 18 mcg into inhaler and inhale daily.   Yes [provider]  traMADol (ULTRAM) 50 MG tablet Take 50 mg by mouth 2 (two) times daily as needed. 01/16/22  Yes [provider]    Inpatient Medications: Scheduled Meds:  atorvastatin  40 mg Oral Daily   carvedilol  3.125 mg Oral BID WC   divalproex  500 mg Oral BID   escitalopram  10 mg Oral QHS   feeding supplement  237 mL Oral BID BM   hydrOXYzine  10 mg Oral BID   levETIRAcetam  250 mg Oral BID   levothyroxine  25 mcg Oral Daily   lisinopril  2.5 mg Oral Daily   pantoprazole  80 mg Oral Daily   QUEtiapine  200 mg Oral QHS   tiotropium  18 mcg Inhalation Daily   Continuous Infusions:  levofloxacin  (LEVAQUIN) IV 500 mg (03/22/22 1014)   PRN Meds: acetaminophen, albuterol, traMADol  Allergies:    Allergies  Allergen Reactions   Iodinated Contrast Media Shortness Of Breath   Penicillins Anaphylaxis and Shortness Of Breath    Respiratory  Tolerated cefuroxime on 01/06/16   Strawberry Extract Anaphylaxis   Cefepime Itching    Empiric antibiotic, developed pruritis.    Erythromycin Itching   Sulfa Antibiotics Itching, Nausea And Vomiting and Nausea Only    Social History:   Social History   Socioeconomic History   Marital status: Widowed    Spouse name: Not on file   Number of children: Not on file  Years of education: Not on file   Highest education level: Not on file  Occupational History   Not on file  Tobacco Use   Smoking status: Every Day    Packs/day: 0.25    Years: 0.00    Total pack years: 0.00    Types: Cigarettes   Smokeless tobacco: Never  Vaping Use   Vaping Use: Never used  Substance and Sexual Activity   Alcohol use: Not Currently   Drug use: Never   Sexual activity: Not Currently    Birth control/protection: Abstinence  Other Topics Concern   Not on file  Social History Narrative   Not on file   Social Determinants of Health   Financial Resource Strain: Not on file  Food Insecurity: No Food Insecurity (03/17/2022)   Hunger Vital Sign    Worried About Running Out of Food in the Last Year: Never true    Ran Out of Food in the Last Year: Never true  Transportation Needs: No Transportation Needs (03/17/2022)   PRAPARE - Hydrologist (Medical): No    Lack of Transportation (Non-Medical): No  Physical Activity: Not on file  Stress: Not on file  Social Connections: Not on file  Intimate Partner Violence: Not At Risk (03/17/2022)   Humiliation, Afraid, Rape, and Kick questionnaire    Fear of Current or Ex-Partner: No    Emotionally Abused: No    Physically Abused: No    Sexually Abused: No    Family History:     Family History  Problem Relation Age of Onset   Cancer Mother        "femal cancer"   Prostate cancer Father      ROS:  Please see the history of present illness.   All other ROS reviewed and negative.     Physical Exam/Data:   Vitals:   03/22/22 0730 03/22/22 0830 03/22/22 0930 03/22/22 1100  BP: (!) 148/93 (!) 135/96 (!) 118/92   Pulse: (!) 113 (!) 119 62   Resp: (!) 57 (!) 31 (!) 33   Temp:      TempSrc:      SpO2: 100% 100% 99%   Weight:    (P) 85.5 kg   No intake or output data in the 24 hours ending 03/22/22 1112    03/22/2022   11:00 AM 03/15/2022   12:41 AM 03/14/2022    6:38 PM  Last 3 Weights  Weight (lbs) 188 lb 7.9 oz 188 lb 7.9 oz 191 lb 12.8 oz  Weight (kg) 85.5 kg 85.5 kg 87 kg     Body mass index is 27.84 kg/m (pended).  General:  Well nourished, well developed, in no acute distress HEENT: normal Neck: no JVD Vascular: No carotid bruits; Distal pulses 2+ bilaterally Cardiac:  normal S1, S2; RRR; no murmur  Lungs:  diffusely diminished breath sounds Abd: soft, nontender, no hepatomegaly  Ext: no edema Musculoskeletal:  No deformities, BUE and BLE strength normal and equal Skin: warm and dry  Neuro:  CNs 2-12 intact, no focal abnormalities noted Psych:  Normal affect   EKG:  The EKG was personally reviewed and demonstrates:  Afib,143bpm, LVH, LBBB Telemetry:  Telemetry was personally reviewed and demonstrates:  intermittent AV pacing, Afib, HR 100-120  Relevant CV Studies:  Echo 02/2022  1. Left ventricular ejection fraction, by estimation, is 25 to 30%. The  left ventricle has severely decreased function. The left ventricle  demonstrates regional wall motion abnormalities (see scoring  diagram/findings for description). The left  ventricular internal cavity size was moderately dilated. There is mild  concentric left ventricular hypertrophy. Left ventricular diastolic  parameters are indeterminate. Elevated left ventricular end-diastolic   pressure.   2. Right ventricular systolic function is normal. The right ventricular  size is normal.   3. Left atrial size was severely dilated.   4. Right atrial size was mildly dilated.   5. The mitral valve is normal in structure. Mild mitral valve  regurgitation. No evidence of mitral stenosis.   6. The aortic valve is tricuspid. Aortic valve regurgitation is mild. No  aortic stenosis is present.   7. The inferior vena cava is normal in size with <50% respiratory  variability, suggesting right atrial pressure of 8 mmHg.   Myoview 2021 Narrative & Impression  There was no ST segment deviation noted during stress. No T wave inversion was noted during stress. Defect 1: There is a large defect of severe severity present in the mid anterior, mid inferior, mid inferolateral, apical anterior, apical inferior and apex location. Findings consistent with prior myocardial infarction in the mid to distal LAD and RCA/left circumflex. No significant reversibility. This is a high risk study. Nuclear stress EF: 23%.    Laboratory Data:  High Sensitivity Troponin:   Recent Labs  Lab 03/15/22 0511 03/18/22 1818 03/18/22 1958 03/22/22 0724 03/22/22 0854  TROPONINIHS 56* 124* 122* 197* 677*     Chemistry Recent Labs  Lab 03/19/22 0421 03/22/22 0724  NA 137 136  K 4.4 5.3*  CL 96* 97*  CO2 26 20*  GLUCOSE 180* 234*  BUN 62* 49*  CREATININE 1.24 1.63*  CALCIUM 9.1 9.5  MG 2.4  --   GFRNONAA >60 47*  ANIONGAP 15 19*    No results for input(s): "PROT", "ALBUMIN", "AST", "ALT", "ALKPHOS", "BILITOT" in the last 168 hours. Lipids No results for input(s): "CHOL", "TRIG", "HDL", "LABVLDL", "LDLCALC", "CHOLHDL" in the last 168 hours.  Hematology Recent Labs  Lab 03/20/22 0419 03/21/22 0328 03/22/22 0724  WBC 10.2 9.4 22.6*  RBC 4.21* 4.26 5.31  HGB 12.2* 12.2* 15.1  HCT 36.7* 37.0* 48.2  MCV 87.2 86.9 90.8  MCH 29.0 28.6 28.4  MCHC 33.2 33.0 31.3  RDW 17.3* 17.2* 18.6*  PLT  258 258 339   Thyroid No results for input(s): "TSH", "FREET4" in the last 168 hours.  BNP Recent Labs  Lab 03/18/22 1745 03/22/22 0725  BNP 500.5* 689.8*    DDimer No results for input(s): "DDIMER" in the last 168 hours.   Radiology/Studies:  DG Chest Portable 1 View  Result Date: 03/22/2022 CLINICAL DATA:  Chest pain worsening shortness of breath. EXAM: PORTABLE CHEST 1 VIEW COMPARISON:  Earlier same day FINDINGS: The cardio pericardial silhouette is enlarged. There is pulmonary vascular congestion without overt pulmonary edema. Interstitial markings are diffusely coarsened with chronic features. Left permanent pacemaker noted. Telemetry leads overlie the chest. IMPRESSION: Enlargement of the cardiopericardial silhouette with pulmonary vascular congestion. No interval change since prior study. Electronically Signed   By: Kennith Center M.D.   On: 03/22/2022 07:48   DG Chest Portable 1 View  Result Date: 03/22/2022 CLINICAL DATA:  Shortness of breath. EXAM: PORTABLE CHEST 1 VIEW COMPARISON:  03/18/2022 FINDINGS: The cardio pericardial silhouette is enlarged. There is pulmonary vascular congestion without overt pulmonary edema. Insert no consolidation insert no effusion Interstitial markings are diffusely coarsened with chronic features. Left permanent pacemaker noted. Status post left palpable surgery. Telemetry leads overlie the chest. IMPRESSION:  Enlargement of the cardiopericardial silhouette with pulmonary vascular congestion. Electronically Signed   By: Misty Stanley M.D.   On: 03/22/2022 07:47   DG Chest Port 1 View  Result Date: 03/18/2022 CLINICAL DATA:  Shortness of breath EXAM: PORTABLE CHEST 1 VIEW COMPARISON:  Previous studies including the examination of 03/14/2022 FINDINGS: Transverse diameter of heart is increased. Central pulmonary vessels are more prominent. There is no focal pulmonary consolidation. There is no pleural effusion or pneumothorax. There is prosthetic mitral  valve. Pacemaker/defibrillator battery is seen in the left infraclavicular region. Biventricular pacer leads are noted in place. There is previous internal fixation in left clavicle. IMPRESSION: Cardiomegaly. Central pulmonary vessels are more prominent suggesting CHF. There is no focal pulmonary consolidation. Electronically Signed   By: Elmer Picker M.D.   On: 03/18/2022 16:22     Assessment and Plan:   Acute on chronic systolic heart failure ICM LVEF 25-30% s/p ICD - presents with SOB, he was just discharged 9/14 for COPD   - BNP elevated to 686. CXR with pulmonary vascular congestion - He has a known low LVEF 25-30% - PTA lasix 40mg  daily - PTA Coreg 3.125mg  BID, lisinopril 2.5mg  daily - IV lasix 40mg  BID - no LLE, but appears to have distended abdomen - trend kidney function with diuresis. May need to titrate BB for better rate control, however BP is low  Acute respiratory failure with hypoxia COPD - multiple admission for CHF and COD - currently requiring Bipap - IV lasix in the ER - COPD treatment per IM - wean Bipap as able  Leukocytosis - presents with leukocytosis WBC 22 - CXR w/o PNA - may be GI in nature given diarrhea  Paroxysmal Afib - appears to be in Afib RVR with rates 100-120, exacerbated by breathing issues - Eliquis held for IV heparin - rate control with Coreg, may ned to switch to metoprolol  AKI on CKD stage 3 - Baseline Scr 1.10 - Scr 1.63 on admission. BUN 49  Elevated troponin CAD s/p CABG in 2017 - HS troponin 197>677 - no chest pain reported - IV heparin x 48 hours - known low EF/ICM - Myoview in 2021 was high risk with a large defect consistent with prior MI, EF 23% - Can consider repeat Myoview lexsican, however will not likely change plan - continue BB and statin therapy  For questions or updates, please contact [place] Please consult [email or web address] for contact info under    Signed, Gumaro Brightbill Ninfa Meeker, PA-C   03/22/2022 11:12 AM

## 2022-03-22 NOTE — Consult Note (Signed)
ANTICOAGULATION CONSULT NOTE - Initial Consult  Pharmacy Consult for heparin infusion Indication: chest pain/ACS  Allergies  Allergen Reactions   Iodinated Contrast Media Shortness Of Breath   Penicillins Anaphylaxis and Shortness Of Breath    Respiratory  Tolerated cefuroxime on 01/06/16   Strawberry Extract Anaphylaxis   Cefepime Itching    Empiric antibiotic, developed pruritis.    Erythromycin Itching   Sulfa Antibiotics Itching, Nausea And Vomiting and Nausea Only    Patient Measurements: Height: 5\' 9"  (175.3 cm) (per last admission) Weight: 85.5 kg (188 lb 7.9 oz) (per last admision) IBW/kg (Calculated) : 70.7 Heparin Dosing Weight: 85.5 kg  Vital Signs: Temp: 97.5 F (36.4 C) (09/15 0706) Temp Source: Axillary (09/15 0706) BP: 118/92 (09/15 0930) Pulse Rate: 62 (09/15 0930)  Labs: Recent Labs    03/20/22 0419 03/21/22 0328 03/22/22 0724 03/22/22 0854  HGB 12.2* 12.2* 15.1  --   HCT 36.7* 37.0* 48.2  --   PLT 258 258 339  --   CREATININE  --   --  1.63*  --   TROPONINIHS  --   --  197* 677*    Estimated Creatinine Clearance: 50.3 mL/min (A) (by C-G formula based on SCr of 1.63 mg/dL (H)).   Medical History: Past Medical History:  Diagnosis Date   Acute pulmonary edema (HCC) 2017   Acute respiratory failure with hypoxia (HCC) 2017   Bipolar 1 disorder (HCC)    CHF (congestive heart failure) (HCC)    Chronic kidney disease    COPD (chronic obstructive pulmonary disease) (HCC)    Depression    Hypertension    Myocardial infarction [facility])    NSTEMI (non-ST elevated myocardial infarction) (HCC)    RVAD (right ventricular assist device) present (HCC) 01/06/2016   Seizures (HCC)    childhood   Stroke (HCC)    Tobacco abuse     Medications:  Scheduled:   atorvastatin  40 mg Oral Daily   carvedilol  3.125 mg Oral BID WC   divalproex  500 mg Oral BID   escitalopram  10 mg Oral QHS   feeding supplement  237 mL Oral BID BM   hydrOXYzine  10 mg Oral  BID   levETIRAcetam  250 mg Oral BID   levothyroxine  25 mcg Oral Daily   lisinopril  2.5 mg Oral Daily   pantoprazole  80 mg Oral Daily   QUEtiapine  200 mg Oral QHS   tiotropium  18 mcg Inhalation Daily   Infusions:  PRN: acetaminophen, albuterol, traMADol  Assessment: Carl [name] is a 61 y.o. male presenting with SOB and chest pain.PMH significant for COPD, CHF, TUD. Patient was on [facility] PTA per chart review with last dose reported to be on 9/14. Pharmacy has been consulted to initiate and manage heparin infusion.   Baseline Labs: Hgb 15.1, Hct 48.2, Plt 339. aPTT, HL, PT/INR not drawn prior to starting heparin infusion  Goal of Therapy:  Heparin level 0.3-0.7 units/ml aPTT 66-102 seconds Monitor platelets by anticoagulation protocol: Yes   Plan:  Give 4000 units bolus x 1 Start heparin infusion at 1100 units/hr Check aPTT in 6 hours  Continue to monitor aPTT until HL and aPTT correlate.  Check HL daily for correlation until HL and aPTT correlate. Switch to HL monitoring once HL and aPTT correlate.  Thank you for allowing pharmacy to be a part of this patient's care.   10/14, PharmD PGY1 Pharmacy Resident 03/22/2022 11:22 AM

## 2022-03-22 NOTE — Assessment & Plan Note (Addendum)
Multifactorial felt to be secondary to COVID, CHF and COPD.  Able to be weaned off of oxygen altogether.

## 2022-03-22 NOTE — Assessment & Plan Note (Signed)
Continue statin therapy.

## 2022-03-22 NOTE — Assessment & Plan Note (Signed)
Had some issues with hypotension, possibly from diuresis.  Since resolved.

## 2022-03-22 NOTE — ED Notes (Addendum)
MD paged about pt being NPO because of BiPAP, MD states "OK to hold diet and po meds"

## 2022-03-22 NOTE — Assessment & Plan Note (Signed)
Continue home meds. Seizure precautions

## 2022-03-22 NOTE — ED Notes (Signed)
One set of blood cultures draw prior to IV antibiotic start. No order at this time. Sent to lab for hold.

## 2022-03-22 NOTE — ED Notes (Signed)
Pt on BiPAP. Unable to give anything by mouth while pt is on pressure supportive equipment.  All PO meds held for this reason.

## 2022-03-22 NOTE — ED Provider Notes (Signed)
Emergency Medicine Provider Triage Evaluation Note  Carl [name] , a 61 y.o. male  was evaluated in triage.  Pt complains of shortness of breath and chest pain.  Review of Systems  Positive: Shortness of breath and chest pain Negative: Fever  Physical Exam  BP 129/72   Pulse (!) 50   Temp (!) 97.5 F (36.4 C) (Axillary)   Resp (!) 50   SpO2 100%  Gen:   Awake, in respiratory distress Resp:  Increased work of breathing, diminished aeration, scattered mild wheezing MSK:   No peripheral edema Other:  Sinus tachycardia  Medical Decision Making  Medically screening exam initiated at 7:11 AM.  Appropriate orders placed.  Carl [name] was informed that the remainder of the evaluation will be completed by another provider, this initial triage assessment does not replace that evaluation, and the importance of remaining in the ED until their evaluation is complete.    Patient brought into the emergency department by EMS at shift change.  I went to evaluate patient immediately to place orders and initiate work-up.  Patient complaining of chest pain and shortness of breath.  History of COPD with continued tobacco use.  Brought in from home.  EMS gave 1 breathing treatment and brought him on on a nonrebreather.  Patient immediately transitioned to BiPAP.  Solu-Medrol, magnesium and further albuterol, Atrovent ordered for patient.  He was just discharged yesterday for a COPD exacerbation.  He is complaining of chest tightness.  EKG shows sinus tachycardia with some mild ST elevation anteriorly which may be rate related.  ABG shows a metabolic acidosis.  Labs, chest x-ray ordered.  Care to be assumed by oncoming ED physician. Dr. Darnelle Catalan.   Jizel Cheeks, Layla Maw, DO 03/22/22 [phone]

## 2022-03-22 NOTE — Assessment & Plan Note (Signed)
Patient presents with leukocytosis. No fever, no productive cough, no dysuria. He has had recent hospitalization. CXR w/o PNA. Question of demargination related to stress vs PNA. He did received 1 dose levquin in ED. Patient did have diarrheal stool and order for c. Diff analysis placed by EDP.  Plan No clear indication for continue Abx at this time  Await C. Diff testing  Await procalcitonin results  F/u CBCD in AM

## 2022-03-22 NOTE — Assessment & Plan Note (Addendum)
Last echo with EF 23%. Patient presents with increased SOB with rales on exam and rising BNP.  Responding to IV Lasix and has diuresed over 10 L and is more than -8.5 L deficient.  Now changed over to p.o. diuretic

## 2022-03-22 NOTE — H&P (Signed)
History and Physical    Carl [name] [id number] DOB: [birth year] DOA: 03/22/2022  DOS: the patient was seen and examined on 03/22/2022  PCP: [facility]   Patient coming from: Home  I have personally briefly reviewed patient's old medical records in [place]  [name] is a 61 y.o. male with medical history significant for bipolar disorder, chronic systolic CHF, COPD, depression, hypertension, coronary artery disease s/p NSTEMI, stroke and seizure disorder. Recently admitted for COPD exacerbation and decompensated HFrEF 03/14/22. At home he had increased SOB and chest pain. EMS activated. In route to [facility]-ED he was given Albuterol neb and put to a non-rebreather face mask.   ED Course: Afebrile, BP 118/92  HR 62  RR 33. On presentation to ED he was put to BiPAP for respiratory distress. Lab: VBG 7.3/38/206  K 5.3  Cr 1.64 (1.10 03/15/22) BNP 689.8, Troponins 197 to 677. CXR with pulmonary edema. EKG with LBBB, paced -limited interpretation. TRH called to admit to continue evaluation and Tx for pulmonary and coronary issues  Review of Systems: caveat - competency and reliability in question Review of Systems  Constitutional:  Negative for chills and fever.  HENT: Negative.    Eyes: Negative.   Respiratory:  Positive for cough, shortness of breath and wheezing.   Cardiovascular:  Positive for chest pain (reports heavy chest pressure) and palpitations.  Gastrointestinal: Negative.   Genitourinary: Negative.   Musculoskeletal: Negative.   Skin: Negative.   Neurological: Negative.   Endo/Heme/Allergies: Negative.   Psychiatric/Behavioral: Negative.      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 He reports wife died of cancer 5 years ago. He reports having two daughters and one son. 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 (There is a question of reliability and confabulation)   reports that he has been smoking cigarettes. He has been smoking an average of 0.25 packs per day. He has never used smokeless tobacco. He reports that he does not currently use alcohol. He reports that he does not use drugs.  Allergies  Allergen Reactions   Iodinated Contrast Media Shortness Of Breath   Penicillins Anaphylaxis and Shortness Of Breath    Respiratory  Tolerated cefuroxime on 01/06/16   Strawberry Extract Anaphylaxis   Cefepime Itching    Empiric antibiotic, developed pruritis.    Erythromycin Itching   Sulfa Antibiotics Itching, Nausea And Vomiting and Nausea Only    Family History  Problem Relation Age of Onset   Cancer Mother        "  femal cancer"   Prostate cancer Father     Prior to Admission medications   Medication Sig Start Date End Date Taking? Authorizing Provider  acetaminophen (TYLENOL) 500 MG tablet Take 500 mg by mouth every 6 (six) hours as needed for mild pain.   Yes [provider]  albuterol (PROVENTIL) (2.5 MG/3ML) 0.083% nebulizer solution Take 3 mLs (2.5 mg total) by nebulization every 6 (six) hours as needed for wheezing or shortness of breath. 03/21/22  Yes Lurene Shadow, MD  albuterol (VENTOLIN HFA) 108  (90 Base) MCG/ACT inhaler Inhale 2 puffs into the lungs every 6 (six) hours as needed for wheezing. 05/24/19 04/28/22 Yes Clapacs, Jackquline Denmark, MD  apixaban (ELIQUIS) 5 MG TABS tablet TAKE 1 TABLET BY MOUTH TWICE A DAY 01/18/21  Yes Duke Salvia, MD  atorvastatin (LIPITOR) 40 MG tablet Take 1 tablet (40 mg total) by mouth daily. 09/20/21  Yes Iran Ouch, MD  carvedilol (COREG) 3.125 MG tablet Take 3.125 mg by mouth 2 (two) times daily with a meal.   Yes [provider]  divalproex (DEPAKOTE ER) 500 MG 24 hr tablet Take 1 tablet (500 mg total) by mouth 2 (two) times daily. 05/24/19  Yes Clapacs, Jackquline Denmark, MD  escitalopram (LEXAPRO) 10 MG tablet Take 1 tablet (10 mg total) by mouth at bedtime. 05/24/19  Yes Clapacs, Jackquline Denmark, MD  feeding supplement (ENSURE ENLIVE / ENSURE PLUS) LIQD Take 237 mLs by mouth 2 (two) times daily between meals. 05/26/21  Yes Wieting, Richard, MD  furosemide (LASIX) 40 MG tablet Take 1 tablet (40 mg total) by mouth daily. 08/01/20  Yes Duke Salvia, MD  Hydrocortisone Acetate 1 % CREA SMARTSIG:sparingly Topical 3 Times Daily 12/24/21  Yes [provider]  hydrOXYzine (ATARAX) 10 MG tablet Take 10 mg by mouth 2 (two) times daily. 12/24/21  Yes [provider]  levETIRAcetam (KEPPRA) 250 MG tablet Take 1 tablet (250 mg total) by mouth 2 (two) times daily. 03/24/20  Yes Minna Antis, MD  levothyroxine (SYNTHROID) 25 MCG tablet Take 1 tablet (25 mcg total) by mouth daily. 05/24/19  Yes Clapacs, Jackquline Denmark, MD  lisinopril (ZESTRIL) 2.5 MG tablet TAKE 1 TABLET BY MOUTH ONCE DAILY 01/21/21  Yes Alver Sorrow, NP  omeprazole (PRILOSEC) 20 MG capsule Take 20 mg by mouth daily.   Yes [provider]  QUEtiapine (SEROQUEL) 200 MG tablet Take 200 mg by mouth at bedtime.   Yes [provider]  tiotropium (SPIRIVA) 18 MCG inhalation capsule Place 18 mcg into inhaler and inhale daily.   Yes [provider]  traMADol (ULTRAM) 50 MG  tablet Take 50 mg by mouth 2 (two) times daily as needed. 01/16/22  Yes [provider]    Physical Exam: Vitals:   03/22/22 0730 03/22/22 0830 03/22/22 0930 03/22/22 1100  BP: (!) 148/93 (!) 135/96 (!) 118/92   Pulse: (!) 113 (!) 119 62   Resp: (!) 57 (!) 31 (!) 33   Temp:      TempSrc:      SpO2: 100% 100% 99%   Weight:    85.5 kg  Height:    5\' 9"  (1.753 m)    Physical Exam Vitals and nursing note reviewed.  Constitutional:      General: He is not in acute distress.    Appearance: He is obese. He is ill-appearing.  HENT:     Head: Normocephalic and atraumatic.     Mouth/Throat:     Mouth:  Mucous membranes are moist.     Pharynx: Oropharynx is clear.  Eyes:     Extraocular Movements: Extraocular movements intact.     Pupils: Pupils are equal, round, and reactive to light.  Neck:     Vascular: No hepatojugular reflux.  Cardiovascular:     Rate and Rhythm: Rhythm irregular.     Heart sounds: Normal heart sounds.     Comments: 2+ radial pulse, trace DP pulse Pulmonary:     Effort: No accessory muscle usage.     Breath sounds: Examination of the right-lower field reveals wheezing and rales. Examination of the left-lower field reveals wheezing and rales. Wheezing and rales present. No decreased breath sounds.     Comments: On BiPAP Chest:     Chest wall: No mass or tenderness.  Abdominal:     Palpations: Abdomen is soft.     Comments: Hypoactive BS  Musculoskeletal:        General: Normal range of motion.     Cervical back: Normal range of motion and neck supple.  Skin:    General: Skin is warm and dry.  Neurological:     Mental Status: He is alert.     Cranial Nerves: No cranial nerve deficit.     Comments: Appears oriented to person and place, context.   Psychiatric:        Mood and Affect: Mood normal.        Behavior: Behavior normal.      Labs on Admission: I have personally reviewed following labs and imaging studies  CBC: Recent Labs  Lab  03/18/22 0501 03/19/22 0421 03/20/22 0419 03/21/22 0328 03/22/22 0724  WBC 13.1* 10.4 10.2 9.4 22.6*  NEUTROABS  --   --   --   --  13.3*  HGB 11.5* 12.3* 12.2* 12.2* 15.1  HCT 35.0* 37.0* 36.7* 37.0* 48.2  MCV 87.7 87.3 87.2 86.9 90.8  PLT 272 263 258 258 [id number]   Basic Metabolic Panel: Recent Labs  Lab 03/19/22 0421 03/22/22 0724  NA 137 136  K 4.4 5.3*  CL 96* 97*  CO2 26 20*  GLUCOSE 180* 234*  BUN 62* 49*  CREATININE 1.24 1.63*  CALCIUM 9.1 9.5  MG 2.4  --   PHOS 6.1*  --    GFR: Estimated Creatinine Clearance: 50.3 mL/min (A) (by C-G formula based on SCr of 1.63 mg/dL (H)). Liver Function Tests: No results for input(s): "AST", "ALT", "ALKPHOS", "BILITOT", "PROT", "ALBUMIN" in the last 168 hours. No results for input(s): "LIPASE", "AMYLASE" in the last 168 hours. No results for input(s): "AMMONIA" in the last 168 hours. Coagulation Profile: Recent Labs  Lab 03/18/22 1818  INR 1.2   Cardiac Enzymes: No results for input(s): "CKTOTAL", "CKMB", "CKMBINDEX", "TROPONINI" in the last 168 hours. BNP (last 3 results) No results for input(s): "PROBNP" in the last 8760 hours. HbA1C: No results for input(s): "HGBA1C" in the last 72 hours. CBG: Recent Labs  Lab 03/20/22 1134 03/20/22 1616 03/20/22 2050 03/21/22 0819 03/21/22 1122  GLUCAP 222* 309* 284* 229* 251*   Lipid Profile: No results for input(s): "CHOL", "HDL", "LDLCALC", "TRIG", "CHOLHDL", "LDLDIRECT" in the last 72 hours. Thyroid Function Tests: No results for input(s): "TSH", "T4TOTAL", "FREET4", "T3FREE", "THYROIDAB" in the last 72 hours. Anemia Panel: No results for input(s): "VITAMINB12", "FOLATE", "FERRITIN", "TIBC", "IRON", "RETICCTPCT" in the last 72 hours. Urine analysis:    Component Value Date/Time   COLORURINE YELLOW (A) 03/14/2022 1850   APPEARANCEUR CLEAR (A) 03/14/2022 1850  LABSPEC 1.012 03/14/2022 1850   PHURINE 6.0 03/14/2022 1850   GLUCOSEU NEGATIVE 03/14/2022 1850   HGBUR  NEGATIVE 03/14/2022 1850   BILIRUBINUR NEGATIVE 03/14/2022 [place] 03/14/2022 1850   PROTEINUR 100 (A) 03/14/2022 1850   NITRITE NEGATIVE 03/14/2022 1850   LEUKOCYTESUR NEGATIVE 03/14/2022 1850    Radiological Exams on Admission: I have personally reviewed images DG Chest Portable 1 View  Result Date: 03/22/2022 CLINICAL DATA:  Chest pain worsening shortness of breath. EXAM: PORTABLE CHEST 1 VIEW COMPARISON:  Earlier same day FINDINGS: The cardio pericardial silhouette is enlarged. There is pulmonary vascular congestion without overt pulmonary edema. Interstitial markings are diffusely coarsened with chronic features. Left permanent pacemaker noted. Telemetry leads overlie the chest. IMPRESSION: Enlargement of the cardiopericardial silhouette with pulmonary vascular congestion. No interval change since prior study. Electronically Signed   By: Misty Stanley M.D.   On: 03/22/2022 07:48   DG Chest Portable 1 View  Result Date: 03/22/2022 CLINICAL DATA:  Shortness of breath. EXAM: PORTABLE CHEST 1 VIEW COMPARISON:  03/18/2022 FINDINGS: The cardio pericardial silhouette is enlarged. There is pulmonary vascular congestion without overt pulmonary edema. Insert no consolidation insert no effusion Interstitial markings are diffusely coarsened with chronic features. Left permanent pacemaker noted. Status post left palpable surgery. Telemetry leads overlie the chest. IMPRESSION: Enlargement of the cardiopericardial silhouette with pulmonary vascular congestion. Electronically Signed   By: Misty Stanley M.D.   On: 03/22/2022 07:47    EKG: I have personally reviewed EKG: Fib/Flutter, LBBB  Assessment/Plan Active Problems:   Acute respiratory failure with hypoxia (HCC)   Coronary artery disease   Acute on chronic systolic CHF (congestive heart failure) (HCC)   Elevated troponin   Bipolar 1 disorder (HCC)   Essential hypertension   Acute renal failure superimposed on stage 3a chronic  kidney disease (HCC)   Paroxysmal atrial fibrillation (HCC)   Leukocytosis   Seizure (HCC)   HLD (hyperlipidemia)   COPD exacerbation (HCC)    Assessment and Plan: Elevated troponin See CAD above.  Plan Cycle troponins to follow trends  Acute on chronic systolic CHF (congestive heart failure) (HCC) Last echo with EF 23%. Patient presents with increased SOB with rales on exam and rising BNP  Plan Respiratory support with BiPAP  Lasix IV 40 mg q8 x 3 doses  Continue home meds  Cardiology consult requested - assist with adjusting medications  Coronary artery disease Patient with known CAD. S/P CABG. He reports that since being home from hospital 03/21/22 he has had increased chest pain - a heavy pressure in his chest. Troponins: 197 to 677. No acute changes on EKG but LBBB and pacer limit interpretation.   Plan Heparin IV for ACS with history, symptoms and rising Troponin  Cardilology consult: patient followed by Dr. Fletcher Anon.  Continue home cardiac meds  Acute respiratory failure with hypoxia [facility]) Patient with recent admissions for CHF and COPD exacerbation. Presented hypoxemic and is currently on BiPAP  Plan Continue BiPAP  Treat underlying condition and wean off BiPAP as able.  Leukocytosis Patient presents with leukocytosis. No fever, no productive cough, no dysuria. He has had recent hospitalization. CXR w/o PNA. Question of demargination related to stress vs PNA. He did received 1 dose levquin in ED. Patient did have diarrheal stool and order for c. Diff analysis placed by EDP.  Plan No clear indication for continue Abx at this time  Await C. Diff testing  Await procalcitonin results  F/u CBCD in AM  Paroxysmal  atrial fibrillation [facility]) Patient in fib/flutter on EKG. He has been on eliquis  Plan Hold eqliquis  IV heparin ACS protocol  Acute renal failure superimposed on stage 3a chronic kidney disease (HCC) Patient with baseline creatinine of 1.10 as of 03/15/22 now at  1.63  Plan Gentle hydration  F/u Bmet in AM  Essential hypertension BP stable  Plan Continue home meds  Bipolar 1 disorder [facility]) Patient with psych issues. Has also suffered multifactorial encephalopathy. Social Worker states that he is under guardianship and has been deemed incompetent to make medical decisions. Therefore, DSS needs to grant consent for any procedures contemplated. The social worker also stated that he is not competent to make a decision regarding Code status although the patient states he would want resuscitation.  Plan Continue current psych meds  Continue full code status per patient's request until DSS issues a code status order on his behalf.   HLD (hyperlipidemia) Continue statin therapy  Seizure (HCC) Continue home meds. Seizure precautions  COPD exacerbation (HCC) Patient admitted 9/7-9/14 with COPD exacerbation that required BiPAP. Now with recurrent hypoxemia requiring BiPAP. Wheezing present on exam. No clear evidence PNA. Cardiac asthma a possibility. In ED received solumedrol 125mg .   Plan Continue home regimen of Albuterol Nebs and Spiriva  Keep O2 sats > 88%, wean BiPAP as possible        DVT prophylaxis: IV heparin gtts Code Status: Full Code - of note DSS representative states efforts are underway code status - favoring DNR and that patient is deemed incompetent by DSS to make medical decisions. Patient states, with understanding, he wishes full resuscitation.  Family Communication: DSS representative [email or web address] present during interview and exam  Disposition Plan: TBD  Consults called: Cardiology - secure chat request to DR. Gollan placed  Admission status: Inpatient, Step Down Unit   [name], MD [facility] 03/22/2022, 12:14 PM

## 2022-03-22 NOTE — ED Notes (Signed)
Pt weaned from BiPAP to 4L via [place]. Pt tolerating well and speaking in full sentences.

## 2022-03-22 NOTE — Subjective & Objective (Signed)
[name] is a 61 y.o. male with medical history significant for bipolar disorder, chronic systolic CHF, COPD, depression, hypertension, coronary artery disease s/p NSTEMI, stroke and seizure disorder. Recently admitted for COPD exacerbation and decompensated HFrEF 03/14/22. At home he had increased SOB and chest pain. EMS activated. In route to [facility]-ED he was given Albuterol neb and put to a non-rebreather face mask.

## 2022-03-22 NOTE — Progress Notes (Signed)
Inpatient Diabetes Program Recommendations  AACE/ADA: New Consensus Statement on Inpatient Glycemic Control (2015)  Target Ranges:  Prepandial:   less than 140 mg/dL      Peak postprandial:   less than 180 mg/dL (1-2 hours)      Critically ill patients:  140 - 180 mg/dL   Lab Results  Component Value Date   GLUCAP 251 (H) 03/21/2022   HGBA1C 5.6 11/03/2018    Review of Glycemic Control  Latest Reference Range & Units 03/20/22 20:50 03/21/22 08:19 03/21/22 11:22  Glucose-Capillary 70 - 99 mg/dL 916 (H) 945 (H) 038 (H)   Diabetes history:  None Current orders for Inpatient glycemic control:  None  Inpatient Diabetes Program Recommendations:   Note elevated blood sugars.  Please add Novolog 0-9 units tid with meals and HS.  Also consider checking A1C.   Thanks,  Beryl Meager, RN, BC-ADM Inpatient Diabetes Coordinator Pager [phone]  (8a-5p)

## 2022-03-22 NOTE — ED Provider Notes (Signed)
[facility] Provider Note    Event Date/Time   First MD Initiated Contact with Patient 03/22/22 [phone]     (approximate)   History   Shortness of Breath (Patient C/O SOB and chest pain that began this morning. Patient was just Dc'd yesterday with the same complaint)   HPI  Carl [name] is a 61 y.o. male who came in by EMS at shift change.  Patient with history of COPD and CHF continuing to smoke.  EMS brought him in on nonrebreather and had given him 1 albuterol treatment.  Patient was given Solu-Medrol magnesium and a DuoNeb.  Patient was placed on BiPAP.  ABG has come back and looks not too bad pH is 7.31 CO2 is 38 O2 206 he has metabolic acidosis not a respiratory 1.      Physical Exam   Triage Vital Signs: ED Triage Vitals  Enc Vitals Group     BP 03/22/22 0700 129/72     Pulse Rate 03/22/22 0656 84     Resp 03/22/22 0656 (!) 49     Temp 03/22/22 0706 (!) 97.5 F (36.4 C)     Temp Source 03/22/22 0706 Axillary     SpO2 03/22/22 0654 97 %     Weight --      Height --      Head Circumference --      Peak Flow --      Pain Score --      Pain Loc --      Pain Edu? --      Excl. in GC? --     Most recent vital signs: Vitals:   03/22/22 0730 03/22/22 0830  BP: (!) 148/93 (!) 135/96  Pulse: (!) 113 (!) 119  Resp: (!) 57 (!) 31  Temp:    SpO2: 100% 100%     General: Awake, alert very anxious breathing very fast CV:  Peripheral pulses heart regular rate and rhythm very tacky no audible murmurs Abd:  Distended but soft not apparently tender Extremities: No edema   ED Results / Procedures / Treatments   Labs (all labs ordered are listed, but only abnormal results are displayed) Labs Reviewed  CBC WITH DIFFERENTIAL/PLATELET - Abnormal; Notable for the following components:      Result Value   WBC 22.6 (*)    RDW 18.6 (*)    nRBC 4.4 (*)    Neutro Abs 13.3 (*)    Lymphs Abs 4.5 (*)    Monocytes Absolute 1.8 (*)    Abs Immature  Granulocytes 2.97 (*)    All other components within normal limits  BASIC METABOLIC PANEL - Abnormal; Notable for the following components:   Potassium 5.3 (*)    Chloride 97 (*)    CO2 20 (*)    Glucose, Bld 234 (*)    BUN 49 (*)    Creatinine, Ser 1.63 (*)    GFR, Estimated 47 (*)    Anion gap 19 (*)    All other components within normal limits  BRAIN NATRIURETIC PEPTIDE - Abnormal; Notable for the following components:   B Natriuretic Peptide 689.8 (*)    All other components within normal limits  BLOOD GAS, ARTERIAL - Abnormal; Notable for the following components:   pH, Arterial 7.31 (*)    pO2, Arterial 206 (*)    Bicarbonate 19.1 (*)    Acid-base deficit 6.6 (*)    All other components within normal limits  TROPONIN I (HIGH  SENSITIVITY) - Abnormal; Notable for the following components:   Troponin I (High Sensitivity) 197 (*)    All other components within normal limits  SARS CORONAVIRUS 2 BY RT PCR  GASTROINTESTINAL PANEL BY PCR, STOOL (REPLACES STOOL CULTURE)  C DIFFICILE QUICK SCREEN W PCR REFLEX    PROCALCITONIN  TROPONIN I (HIGH SENSITIVITY)     EKG  EKG read and interpreted by me shows a tachycardic rhythm with a rate of 143 the rhythm is either paced her left bundle is difficult to tell because of the artifact.  On the monitor the patient is fully paced.   RADIOLOGY Chest x-ray read and interpreted by me shows massive cardiomegaly and apparent CHF   PROCEDURES:  Critical Care performed: Critical care time half an hour.  This involves some time spent at the bedside monitoring the patient also I reviewed his studies and old studies and old chest x-rays old EKGs etc.  Looked at his old notes.  I am talking to the hospitalist  Procedures   MEDICATIONS ORDERED IN ED: Medications  levofloxacin (LEVAQUIN) IVPB 500 mg (has no administration in time range)  albuterol (PROVENTIL) (2.5 MG/3ML) 0.083% nebulizer solution 10 mg (10 mg Nebulization Given 03/22/22 0703)   ipratropium (ATROVENT) nebulizer solution 1 mg (1 mg Nebulization Given 03/22/22 0723)  methylPREDNISolone sodium succinate (SOLU-MEDROL) 125 mg/2 mL injection 125 mg (125 mg Intravenous Given 03/22/22 0706)  magnesium sulfate IVPB 2 g 50 mL (0 g Intravenous Stopped 03/22/22 0722)  LORazepam (ATIVAN) injection 0.5 mg (0.5 mg Intravenous Given 03/22/22 0722)  ipratropium-albuterol (DUONEB) 0.5-2.5 (3) MG/3ML nebulizer solution 3 mL (3 mLs Nebulization Given 03/22/22 0744)  furosemide (LASIX) injection 40 mg (40 mg Intravenous Given 03/22/22 0742)     IMPRESSION / MDM / ASSESSMENT AND PLAN / ED COURSE  I reviewed the triage vital signs and the nursing notes. Patient with troponin of 197 but he has had multiple troponins over 100 in the past.  His BNP is elevated consistent with CHF.  His white blood count is also elevated which is new.  He had a large diarrheal stool possibly this is the source.  His chest x-ray has some infiltrate which looks like his prior one which was felt to be CHF and his BNP is elevated.  He is allergic to penicillins and cephalosporins so we will give him some Levaquin.  This should cover both lungs and bowel.  His sats have improved on the BiPAP with the DuoNebs.  Differential diagnosis includes, but is not limited to, CHF with heart strain causing his elevated troponin COPD and infectious source either in the lungs or bowel is also likely.  Patient's presentation is most consistent with acute presentation with potential threat to life or bodily function.  The patient is on the cardiac monitor to evaluate for evidence of arrhythmia and/or significant heart rate changes.  Patient has had several runs of what looks like very short V. tach 3-4 beats.    FINAL CLINICAL IMPRESSION(S) / ED DIAGNOSES   Final diagnoses:  COPD exacerbation (HCC)  Hypoxia  Congestive heart failure, unspecified HF chronicity, unspecified heart failure type (HCC)     Rx / DC Orders   ED  Discharge Orders     None        Note:  This document was prepared using Dragon voice recognition software and may include unintentional dictation errors.   Arnaldo Natal, MD 03/22/22 [phone]

## 2022-03-22 NOTE — Assessment & Plan Note (Signed)
See CAD above.  Plan Cycle troponins to follow trends

## 2022-03-22 NOTE — Assessment & Plan Note (Signed)
Patient with known CAD. S/P CABG. He reports that since being home from hospital 03/21/22 he has had increased chest pain - a heavy pressure in his chest. Troponins: 197 to 677. No acute changes on EKG but LBBB and pacer limit interpretation.   Plan Heparin IV for ACS with history, symptoms and rising Troponin  Cardilology consult: patient followed by Dr. Kirke Corin.  Continue home cardiac meds

## 2022-03-23 ENCOUNTER — Other Ambulatory Visit: Payer: Self-pay

## 2022-03-23 DIAGNOSIS — F17210 Nicotine dependence, cigarettes, uncomplicated: Secondary | ICD-10-CM

## 2022-03-23 DIAGNOSIS — I2510 Atherosclerotic heart disease of native coronary artery without angina pectoris: Secondary | ICD-10-CM

## 2022-03-23 DIAGNOSIS — E663 Overweight: Secondary | ICD-10-CM | POA: Diagnosis present

## 2022-03-23 DIAGNOSIS — N170 Acute kidney failure with tubular necrosis: Secondary | ICD-10-CM | POA: Diagnosis not present

## 2022-03-23 DIAGNOSIS — I2583 Coronary atherosclerosis due to lipid rich plaque: Secondary | ICD-10-CM

## 2022-03-23 DIAGNOSIS — J9601 Acute respiratory failure with hypoxia: Secondary | ICD-10-CM | POA: Diagnosis not present

## 2022-03-23 DIAGNOSIS — R778 Other specified abnormalities of plasma proteins: Secondary | ICD-10-CM | POA: Diagnosis not present

## 2022-03-23 DIAGNOSIS — U071 COVID-19: Secondary | ICD-10-CM | POA: Diagnosis present

## 2022-03-23 DIAGNOSIS — I5023 Acute on chronic systolic (congestive) heart failure: Secondary | ICD-10-CM | POA: Diagnosis not present

## 2022-03-23 DIAGNOSIS — E7800 Pure hypercholesterolemia, unspecified: Secondary | ICD-10-CM

## 2022-03-23 LAB — BASIC METABOLIC PANEL
Anion gap: 11 (ref 5–15)
BUN: 47 mg/dL — ABNORMAL HIGH (ref 8–23)
CO2: 29 mmol/L (ref 22–32)
Calcium: 8.7 mg/dL — ABNORMAL LOW (ref 8.9–10.3)
Chloride: 93 mmol/L — ABNORMAL LOW (ref 98–111)
Creatinine, Ser: 1.36 mg/dL — ABNORMAL HIGH (ref 0.61–1.24)
GFR, Estimated: 58 mL/min — ABNORMAL LOW (ref >60)
Glucose, Bld: 91 mg/dL (ref 70–99)
Potassium: 5.3 mmol/L — ABNORMAL HIGH (ref 3.5–5.1)
Sodium: 133 mmol/L — ABNORMAL LOW (ref 135–145)

## 2022-03-23 LAB — CBC WITH DIFFERENTIAL/PLATELET
Abs Immature Granulocytes: 0.77 10*3/uL — ABNORMAL HIGH (ref 0.00–0.07)
Basophils Absolute: 0.1 10*3/uL (ref 0.0–0.1)
Basophils Relative: 1 %
Eosinophils Absolute: 0 10*3/uL (ref 0.0–0.5)
Eosinophils Relative: 0 %
HCT: 41.3 % (ref 39.0–52.0)
Hemoglobin: 13.6 g/dL (ref 13.0–17.0)
Immature Granulocytes: 5 %
Lymphocytes Relative: 10 %
Lymphs Abs: 1.5 10*3/uL (ref 0.7–4.0)
MCH: 29 pg (ref 26.0–34.0)
MCHC: 32.9 g/dL (ref 30.0–36.0)
MCV: 88.1 fL (ref 80.0–100.0)
Monocytes Absolute: 0.9 10*3/uL (ref 0.1–1.0)
Monocytes Relative: 6 %
Neutro Abs: 11.8 10*3/uL — ABNORMAL HIGH (ref 1.7–7.7)
Neutrophils Relative %: 78 %
Platelets: 227 10*3/uL (ref 150–400)
RBC: 4.69 MIL/uL (ref 4.22–5.81)
RDW: 17.9 % — ABNORMAL HIGH (ref 11.5–15.5)
WBC: 15.1 10*3/uL — ABNORMAL HIGH (ref 4.0–10.5)
nRBC: 1.1 % — ABNORMAL HIGH (ref 0.0–0.2)

## 2022-03-23 LAB — PROTIME-INR
INR: 1.3 — ABNORMAL HIGH (ref 0.8–1.2)
Prothrombin Time: 15.7 seconds — ABNORMAL HIGH (ref 11.4–15.2)

## 2022-03-23 LAB — APTT
aPTT: 38 seconds — ABNORMAL HIGH (ref 24–36)
aPTT: 112 seconds — ABNORMAL HIGH (ref 24–36)

## 2022-03-23 LAB — PROCALCITONIN: Procalcitonin: 0.41 ng/mL

## 2022-03-23 MED ORDER — SODIUM CHLORIDE 0.9 % IV SOLN
100.0000 mg | Freq: Every day | INTRAVENOUS | Status: AC
  Administered 2022-03-24 – 2022-03-25 (×2): 100 mg via INTRAVENOUS
  Filled 2022-03-23 (×2): qty 100

## 2022-03-23 MED ORDER — HEPARIN (PORCINE) 25000 UT/250ML-% IV SOLN
1000.0000 [IU]/h | INTRAVENOUS | Status: DC
  Administered 2022-03-23: 1100 [IU]/h via INTRAVENOUS
  Filled 2022-03-23: qty 250

## 2022-03-23 MED ORDER — METOPROLOL TARTRATE 25 MG PO TABS: 12.5000 mg | ORAL_TABLET | Freq: Two times a day (BID) | ORAL | Status: DC

## 2022-03-23 MED ORDER — SODIUM CHLORIDE 0.9 % IV SOLN
200.0000 mg | Freq: Once | INTRAVENOUS | Status: AC
  Administered 2022-03-23: 200 mg via INTRAVENOUS
  Filled 2022-03-23: qty 40

## 2022-03-23 MED ORDER — MIDODRINE HCL 5 MG PO TABS
10.0000 mg | ORAL_TABLET | Freq: Three times a day (TID) | ORAL | Status: DC
  Administered 2022-03-24 – 2022-03-26 (×7): 10 mg via ORAL
  Filled 2022-03-23 (×7): qty 2

## 2022-03-23 MED ORDER — METHYLPREDNISOLONE SODIUM SUCC 40 MG IJ SOLR
40.0000 mg | Freq: Two times a day (BID) | INTRAMUSCULAR | Status: DC
  Administered 2022-03-23 – 2022-03-25 (×4): 40 mg via INTRAVENOUS
  Filled 2022-03-23 (×4): qty 1

## 2022-03-23 MED ORDER — METOPROLOL TARTRATE 25 MG PO TABS
12.5000 mg | ORAL_TABLET | Freq: Two times a day (BID) | ORAL | Status: DC
  Administered 2022-03-23 – 2022-03-24 (×2): 12.5 mg via ORAL
  Filled 2022-03-23 (×2): qty 1

## 2022-03-23 NOTE — Progress Notes (Signed)
Remdesivir - Pharmacy Brief Note   O:  ALT: n/a CXR: Enlargement of the cardiopericardial silhouette with pulmonary vascular congestion. SpO2: 96% on 2L  COVID+ on 9/16. Negative COVID test on 9/7.   A/P:  Remdesivir 200 mg IVPB once followed by 100 mg IVPB daily x 2 days.    Glean Salvo, PharmD Clinical Pharmacist  03/23/2022 3:36 PM

## 2022-03-23 NOTE — Assessment & Plan Note (Signed)
Meets criteria BMI greater than 25 

## 2022-03-23 NOTE — Progress Notes (Signed)
Rounding Note    Patient Name: Carl [name] Date of Encounter: 03/23/2022  [place] Cardiologist: Kathlyn Sacramento, MD   Subjective   Still appears to be in underlying afib with intermittent paced rhythm HR 80-100. BP still soft. He is now off Bipap, O2 just removed. Good UOP. BMET pending.  Inpatient Medications    Scheduled Meds:  apixaban  5 mg Oral BID   atorvastatin  40 mg Oral Daily   carvedilol  3.125 mg Oral BID WC   divalproex  500 mg Oral BID   escitalopram  10 mg Oral QHS   feeding supplement  237 mL Oral BID BM   furosemide  40 mg Intravenous Q8H   hydrOXYzine  10 mg Oral BID   levETIRAcetam  250 mg Oral BID   levothyroxine  25 mcg Oral Daily   lisinopril  2.5 mg Oral Daily   pantoprazole  80 mg Oral Daily   QUEtiapine  200 mg Oral QHS   tiotropium  18 mcg Inhalation Daily   Continuous Infusions:  PRN Meds: acetaminophen, albuterol, traMADol   Vital Signs    Vitals:   03/23/22 0010 03/23/22 0028 03/23/22 0420 03/23/22 0600  BP: (!) 85/55 102/74 102/61   Pulse: 85 84 72   Resp: 20 20 (!) 22 19  Temp: 97.6 F (36.4 C)  98.3 F (36.8 C)   TempSrc: Oral  Oral   SpO2: 100% 100% 100%   Weight:      Height:        Intake/Output Summary (Last 24 hours) at 03/23/2022 0913 Last data filed at 03/23/2022 [id number] Gross per 24 hour  Intake 1036.51 ml  Output 2500 ml  Net -1463.49 ml      03/22/2022   11:00 AM 03/15/2022   12:41 AM 03/14/2022    6:38 PM  Last 3 Weights  Weight (lbs) 188 lb 7.9 oz 188 lb 7.9 oz 191 lb 12.8 oz  Weight (kg) 85.5 kg 85.5 kg 87 kg      Telemetry    Intermittently paced rhythm, underlying Afib, HR 80-100 - Personally Reviewed  ECG    No new - Personally Reviewed  Physical Exam   GEN: No acute distress.   Neck: No JVD Cardiac: Ireg IRreg, no murmurs, rubs, or gallops.  Respiratory: diffuse wheezing bilaterally. GI: Soft, nontender, non-distended  MS: No edema; No deformity. Neuro:  Nonfocal  Psych:  Normal affect   Labs    High Sensitivity Troponin:   Recent Labs  Lab 03/18/22 1958 03/22/22 0724 03/22/22 0854 03/22/22 1655 03/22/22 2235  TROPONINIHS 122* 197* 677* 1,543* 1,438*     Chemistry Recent Labs  Lab 03/19/22 0421 03/22/22 0724  NA 137 136  K 4.4 5.3*  CL 96* 97*  CO2 26 20*  GLUCOSE 180* 234*  BUN 62* 49*  CREATININE 1.24 1.63*  CALCIUM 9.1 9.5  MG 2.4  --   GFRNONAA >60 47*  ANIONGAP 15 19*    Lipids No results for input(s): "CHOL", "TRIG", "HDL", "LABVLDL", "LDLCALC", "CHOLHDL" in the last 168 hours.  Hematology Recent Labs  Lab 03/21/22 0328 03/22/22 0724 03/23/22 0503  WBC 9.4 22.6* 15.1*  RBC 4.26 5.31 4.69  HGB 12.2* 15.1 13.6  HCT 37.0* 48.2 41.3  MCV 86.9 90.8 88.1  MCH 28.6 28.4 29.0  MCHC 33.0 31.3 32.9  RDW 17.2* 18.6* 17.9*  PLT 258 339 227   Thyroid No results for input(s): "TSH", "FREET4" in the last 168 hours.  BNP  Recent Labs  Lab 03/18/22 1745 03/22/22 0725  BNP 500.5* 689.8*    DDimer No results for input(s): "DDIMER" in the last 168 hours.   Radiology    DG Chest Portable 1 View  Result Date: 03/22/2022 CLINICAL DATA:  Chest pain worsening shortness of breath. EXAM: PORTABLE CHEST 1 VIEW COMPARISON:  Earlier same day FINDINGS: The cardio pericardial silhouette is enlarged. There is pulmonary vascular congestion without overt pulmonary edema. Interstitial markings are diffusely coarsened with chronic features. Left permanent pacemaker noted. Telemetry leads overlie the chest. IMPRESSION: Enlargement of the cardiopericardial silhouette with pulmonary vascular congestion. No interval change since prior study. Electronically Signed   By: Carl Stanley M.D.   On: 03/22/2022 07:48   DG Chest Portable 1 View  Result Date: 03/22/2022 CLINICAL DATA:  Shortness of breath. EXAM: PORTABLE CHEST 1 VIEW COMPARISON:  03/18/2022 FINDINGS: The cardio pericardial silhouette is enlarged. There is pulmonary vascular congestion without  overt pulmonary edema. Insert no consolidation insert no effusion Interstitial markings are diffusely coarsened with chronic features. Left permanent pacemaker noted. Status post left palpable surgery. Telemetry leads overlie the chest. IMPRESSION: Enlargement of the cardiopericardial silhouette with pulmonary vascular congestion. Electronically Signed   By: Carl Stanley M.D.   On: 03/22/2022 07:47    Cardiac Studies   Echo 02/2022  1. Left ventricular ejection fraction, by estimation, is 25 to 30%. The  left ventricle has severely decreased function. The left ventricle  demonstrates regional wall motion abnormalities (see scoring  diagram/findings for description). The left  ventricular internal cavity size was moderately dilated. There is mild  concentric left ventricular hypertrophy. Left ventricular diastolic  parameters are indeterminate. Elevated left ventricular end-diastolic  pressure.   2. Right ventricular systolic function is normal. The right ventricular  size is normal.   3. Left atrial size was severely dilated.   4. Right atrial size was mildly dilated.   5. The mitral valve is normal in structure. Mild mitral valve  regurgitation. No evidence of mitral stenosis.   6. The aortic valve is tricuspid. Aortic valve regurgitation is mild. No  aortic stenosis is present.   7. The inferior vena cava is normal in size with <50% respiratory  variability, suggesting right atrial pressure of 8 mmHg.    Myoview 2021 Narrative & Impression  There was no ST segment deviation noted during stress. No T wave inversion was noted during stress. Defect 1: There is a large defect of severe severity present in the mid anterior, mid inferior, mid inferolateral, apical anterior, apical inferior and apex location. Findings consistent with prior myocardial infarction in the mid to distal LAD and RCA/left circumflex. No significant reversibility. This is a high risk study. Nuclear stress EF: 23%.     Patient Profile     61 y.o. male with a hx of CAD s/p CABG in 2017, ICM, EF30-35%, s/p ICD 2017, COPD, paroxysmal Afib on Eliquis, CVA, bipolar disorder, current smoker who is being seen 03/22/2022 for the evaluation of CHF  Assessment & Plan    Acute on chronic systolic heart failure ICM LVEF 25-30% s/p ICD - presented with SOB, he was just discharged 9/14 for COPD   - BNP elevated to 686. CXR with pulmonary vascular congestion. EKG showed rapid afib - He has a known low LVEF 25-30% - PTA lasix 40mg  daily - PTA Coreg 3.125mg  BID, lisinopril 2.5mg  daily - IV lasix 40mg  BID - no LLE, but appears to have distended abdomen>improving - trend kidney function with  diuresis. May need to titrate BB for better rate control, however BP is low   Acute respiratory failure with hypoxia COPD - multiple admission for CHF and COD - off Bipap - IV lasix  - COPD treatment per IM, diffuse wheezing on exam  Leukocytosis - presents with leukocytosis WBC 22 - CXR w/o PNA - may be GI in nature given diarrhea.  - WBC trending down   Paroxysmal Afib - he was in Afib RVR with rates 100-120, exacerbated by breathing issues - Eliquis held for IV heparin - rate control with Coreg, may ned to switch to metoprolol given low BP - not a good candidate for amiodarone given underlying lung disease. Also, if NSR is restored may not hold given dilated atria.  - May need to see EP   AKI on CKD stage 3 - Baseline Scr 1.10 - Scr 1.63 on admission. BUN 49   Elevated troponin CAD s/p CABG in 2017 - HS troponin [phone] - no chest pain reported - IV heparin x 48 hours - known low EF/ICM - Myoview in 2021 was high risk with a large defect consistent with prior MI, EF 23% - continue BB and statin therapy - not a good candidate for invasive work-up due to respiratory status and CKD  For questions or updates, please contact [place] Please consult [email or web address] for contact info under         Signed, Niomie Englert Ninfa Meeker, PA-C  03/23/2022, 9:13 AM

## 2022-03-23 NOTE — Hospital Course (Addendum)
61 year old male with past medical history of bipolar disorder, chronic systolic heart failure with ejection fraction of 25%, CAD status post non-STEMI, CVA, seizure disorder and COPD with recent hospitalization for CHF and COPD exacerbations who presented to the emergency room on 9/15 with shortness of breath and chest pain and found to have hypoxia.  Patient was admitted for CHF exacerbation.  Also noted to have elevated troponins and cardiology consulted.  After patient admitted, COVID PCR came back positive.  Patient started on IV Remdisivir and IV steroids.  Over the next few days, patient's oxygenation has improved.  Initially was on BiPAP and has been able to be weaned off of oxygen to room air.  Has diuresed over 10 L.  Required to stay in the hospital cannot return to ALF until 9/25, 10 days post positive COVID.

## 2022-03-23 NOTE — Progress Notes (Signed)
[facility] Progress Note  Patient: Carl [name]    [id number]  DOA: 03/22/2022    Date of Service: the patient was seen and examined on 03/23/2022  Brief hospital course: 61 year old male with past medical history of bipolar disorder, chronic systolic heart failure with ejection fraction of 25%, CAD status post non-STEMI, CVA, seizure disorder and COPD with recent hospitalization for CHF and COPD exacerbations who presented to the emergency room on 9/15 with shortness of breath and chest pain and found to have hypoxia.  Patient was admitted for CHF exacerbation.  Also noted to have elevated troponins and cardiology consulted.  After patient admitted, COVID PCR came back positive.  Patient started on IV Remdisivir  Assessment and Plan: Assessment and Plan: * Elevated troponin Felt to more demand ischemia in the setting of respiratory issues, heart failure COVID.  Cardiology following.  Acute respiratory failure with hypoxia (HCC) Multifactorial felt to be secondary to COVID, CHF and COPD.  Has been able to be weaned down from BiPAP and is currently on 4 L nasal cannula.  Treat underlying issues.    Acute on chronic systolic CHF (congestive heart failure) (HCC) Last echo with EF 23%. Patient presents with increased SOB with rales on exam and rising BNP.  Responding to IV Lasix and has diuresed 3.5 L and is -2.5 L deficient.  COPD exacerbation (HCC) Continue nebulizers and have added steroids.   COVID-19 virus infection Given hypoxia, starting Remdisivir.  Checking CRP.  Have started Solu-Medrol.  Hypotension Currently having some issues with hypotension, possibly from diuresis.  Checking lactic acid to ensure no signs of sepsis.  In the meantime, have started some midodrine.  We will like to hold off on IV fluids given CHF.  Acute renal failure superimposed on stage 3a chronic kidney disease ([place]) Baseline around 1.1.  Improved following diuresis.  Bipolar 1 disorder  [facility]) Patient with psych issues. Has also suffered multifactorial encephalopathy. Social Worker states that he is under guardianship and has been deemed incompetent to make medical decisions. Therefore, DSS needs to grant consent for any procedures contemplated. The social worker also stated that he is not competent to make a decision regarding Code status although the patient states he would want resuscitation.  We will plan to continue the patient's psychiatric medications.  Paroxysmal atrial fibrillation (HCC) Patient in fib/flutter on EKG. He has been on eliquis  Plan Hold eqliquis  IV heparin ACS protocol  Seizure ([place]) Continue home meds. Seizure precautions  Coronary artery disease Patient with known CAD. S/P CABG. He reports that since being home from hospital 03/21/22 he has had increased chest pain - a heavy pressure in his chest. Troponins: 197 to 677. No acute changes on EKG but LBBB and pacer limit interpretation.   Plan Heparin IV for ACS with history, symptoms and rising Troponin  Cardilology consult: patient followed by Dr. Fletcher Anon.  Continue home cardiac meds  HLD (hyperlipidemia) Continue statin therapy  Hypothyroidism Continue Synthroid  Overweight (BMI 25.0-29.9) Meets criteria BMI greater than 25       Body mass index is 27.84 kg/m.        Consultants: Cardiology  Procedures: None  Antimicrobials: IV Remdisivir 9/16-present  Code Status: Full code   Subjective: Patient states his breathing is still rough, although he feels better than before  Objective: Noting some hypotension, low-grade fever Vitals:   03/23/22 1220 03/23/22 1630  BP: 104/68 (!) 87/51  Pulse: (!) 102 86  Resp: (!) 24 (!) 22  Temp: [name])  100.4 F (38 C) 98.1 F (36.7 C)  SpO2: 96% 93%    Intake/Output Summary (Last 24 hours) at 03/23/2022 1732 Last data filed at 03/23/2022 1200 Gross per 24 hour  Intake 1036.51 ml  Output 2700 ml  Net -1663.49 ml   Filed Weights    03/22/22 1100  Weight: 85.5 kg   Body mass index is 27.84 kg/m.  Exam:  General: Oriented x2, no acute distress HEENT: Normocephalic, atraumatic, mucous membranes slightly dry Cardiovascular: Regular rate and rhythm, S1-S2, 2 out of 6 systolic ejection murmur Respiratory: Bilateral end expiratory wheeze Abdomen: Soft, nontender, nondistended, positive bowel sounds Musculoskeletal: No clubbing or cyanosis, trace pitting edema Skin: Skin breaks, tears or lesions Psychiatry: Patient has underlying bipolar disorder and is not felt to be competent to make his own decisions. Neurology: No focal deficits  Data Reviewed: Noted improving creatinine, mildly elevated procalcitonin, positive COVID titer  Disposition:  Status is: Inpatient Remains inpatient appropriate because:  -Diuresis -Remdisivir for COVID -Treatment of hypoxia    Anticipated discharge date: 9/19  Family Communication: Patient is a ward of the state.  He told me if I needed to contact anyone, to contact his caseworker. DVT Prophylaxis:   Heparin    Author: Annita Brod ,MD 03/23/2022 5:32 PM  To reach On-call, see care teams to locate the attending and reach out via [email or web address]. Between 7PM-7AM, please contact night-coverage If you still have difficulty reaching the attending provider, please page the [facility] (Director on Call) for [facility] on amion for assistance.

## 2022-03-23 NOTE — Progress Notes (Signed)
   03/23/22 2009  Assess: MEWS Score  Temp 98.6 F (37 C)  BP 99/67  MAP (mmHg) 78  Pulse Rate 94  ECG Heart Rate 95  Resp (!) 32  SpO2 94 %  O2 Device Nasal Cannula  O2 Flow Rate (L/min) 2 L/min  Assess: MEWS Score  MEWS Temp 0  MEWS Systolic 1  MEWS Pulse 0  MEWS RR 2  MEWS LOC 0  MEWS Score 3  MEWS Score Color Yellow  Assess: if the MEWS score is Yellow or Red  Were vital signs taken at a resting state? Yes  Focused Assessment No change from prior assessment  Treat  MEWS Interventions Other (Comment)  Pain Scale 0-10  Pain Score 0  Escalate  MEWS: Escalate Yellow: discuss with charge nurse/RN and consider discussing with provider and RRT  Notify: Charge Nurse/RN  Name of Charge Nurse/RN Notified [place]  Date Charge Nurse/RN Notified 03/23/22  Time Charge Nurse/RN Notified 2100  Document  Patient Outcome Other (Comment) (previously yellow)  Progress note created (see row info) Yes  Assess: SIRS CRITERIA  SIRS Temperature  0  SIRS Pulse 1  SIRS Respirations  1  SIRS WBC 1  SIRS Score Sum  3

## 2022-03-23 NOTE — Assessment & Plan Note (Addendum)
Given hypoxia, started on Remdisivir and Solu-Medrol.  CRP level trending down.  Remdisivir completed 9/18.  Solu-Medrol changed over to p.o. prednisone for quick taper.  Although patient has improved, he will need to stay here until Monday, 9/25, where he will complete a 10-day isolation for COVID

## 2022-03-23 NOTE — Assessment & Plan Note (Signed)
Continue Synthroid °

## 2022-03-23 NOTE — Consult Note (Signed)
ANTICOAGULATION CONSULT NOTE - Initial Consult  Pharmacy Consult for apixaban to heparin Indication:  elevated troponins  Allergies  Allergen Reactions   Iodinated Contrast Media Shortness Of Breath   Penicillins Anaphylaxis and Shortness Of Breath    Respiratory  Tolerated cefuroxime on 01/06/16   Strawberry Extract Anaphylaxis   Cefepime Itching    Empiric antibiotic, developed pruritis.    Erythromycin Itching   Sulfa Antibiotics Itching, Nausea And Vomiting and Nausea Only    Patient Measurements: Height: 5\' 9"  (175.3 cm) (per last admission) Weight: 85.5 kg (188 lb 7.9 oz) (per last admision) IBW/kg (Calculated) : 70.7 Heparin Dosing Weight: 85.5  Vital Signs: Temp: 100.4 F (38 C) (09/16 1220) Temp Source: Oral (09/16 1220) BP: 104/68 (09/16 1220) Pulse Rate: 102 (09/16 1220)  Labs: Recent Labs    03/21/22 0328 03/21/22 0328 03/22/22 0724 03/22/22 0854 03/22/22 1655 03/22/22 2235 03/23/22 0503  HGB 12.2*  --  15.1  --   --   --  13.6  HCT 37.0*  --  48.2  --   --   --  41.3  PLT 258  --  339  --   --   --  227  LABPROT  --   --   --   --   --   --  15.7*  INR  --   --   --   --   --   --  1.3*  CREATININE  --   --  1.63*  --   --   --  1.36*  TROPONINIHS  --    < > 197* 677* 1,543* 1,438*  --    < > = values in this interval not displayed.    Estimated Creatinine Clearance: 60.2 mL/min (A) (by C-G formula based on SCr of 1.36 mg/dL (H)).   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 Pt presenting with elevated troponins (197 >> 1,543 peak), as well as acute respiratory failure with hypoxia, exhibiting diffuse wheezing on exam. Pt on apixaban 5 mg twice daily PTA for paroxysmal Afib. Pt previously started on heparin yesterday at 1100 units/hr during this same admission but was only on it for 3 hrs before transitioning back to apixaban. Last dose of apixaban at 0921 on 03/23/2022.  Cards has been consulted and advises switching back to heparin drip for 48 hrs.   Baseline Labs: aPTT - 38; INR - 1.3; Hgb - 13.6; Plts - 227  Goal of Therapy:  Heparin level 0.3-0.7 units/ml / aPTT 66-102s Monitor platelets by anticoagulation protocol: Yes   Plan:  Hold apixaban while on heparin infusion Start heparin infusion at previous rate of 1100 units/hr Check baseline aPTT and again after 6  hours. Check HL with tomorrow AM labs. Titrate by aPTT's until lab correlation is noted, then titrate by HL alone. Continue to monitor H&H and platelets daily while on heparin gtt.  Terie Purser, PharmD PGY-1 Pharmacy Resident 03/23/2022 3:39 PM

## 2022-03-24 DIAGNOSIS — N170 Acute kidney failure with tubular necrosis: Secondary | ICD-10-CM | POA: Diagnosis not present

## 2022-03-24 DIAGNOSIS — I5023 Acute on chronic systolic (congestive) heart failure: Secondary | ICD-10-CM | POA: Diagnosis not present

## 2022-03-24 DIAGNOSIS — R778 Other specified abnormalities of plasma proteins: Secondary | ICD-10-CM | POA: Diagnosis not present

## 2022-03-24 DIAGNOSIS — J9601 Acute respiratory failure with hypoxia: Secondary | ICD-10-CM | POA: Diagnosis not present

## 2022-03-24 LAB — C-REACTIVE PROTEIN: CRP: 18.4 mg/dL — ABNORMAL HIGH (ref <1.0)

## 2022-03-24 LAB — CBC
HCT: 39.2 % (ref 39.0–52.0)
Hemoglobin: 12.9 g/dL — ABNORMAL LOW (ref 13.0–17.0)
MCH: 28.5 pg (ref 26.0–34.0)
MCHC: 32.9 g/dL (ref 30.0–36.0)
MCV: 86.7 fL (ref 80.0–100.0)
Platelets: 179 10*3/uL (ref 150–400)
RBC: 4.52 MIL/uL (ref 4.22–5.81)
RDW: 17.6 % — ABNORMAL HIGH (ref 11.5–15.5)
WBC: 10 10*3/uL (ref 4.0–10.5)
nRBC: 0.2 % (ref 0.0–0.2)

## 2022-03-24 LAB — PROCALCITONIN: Procalcitonin: 0.17 ng/mL

## 2022-03-24 LAB — BASIC METABOLIC PANEL
Anion gap: 11 (ref 5–15)
BUN: 56 mg/dL — ABNORMAL HIGH (ref 8–23)
CO2: 25 mmol/L (ref 22–32)
Calcium: 8.5 mg/dL — ABNORMAL LOW (ref 8.9–10.3)
Chloride: 99 mmol/L (ref 98–111)
Creatinine, Ser: 1.51 mg/dL — ABNORMAL HIGH (ref 0.61–1.24)
GFR, Estimated: 52 mL/min — ABNORMAL LOW (ref >60)
Glucose, Bld: 149 mg/dL — ABNORMAL HIGH (ref 70–99)
Potassium: 4.6 mmol/L (ref 3.5–5.1)
Sodium: 135 mmol/L (ref 135–145)

## 2022-03-24 LAB — MAGNESIUM: Magnesium: 2.7 mg/dL — ABNORMAL HIGH (ref 1.7–2.4)

## 2022-03-24 LAB — APTT
aPTT: 148 seconds — ABNORMAL HIGH (ref 24–36)
aPTT: 44 seconds — ABNORMAL HIGH (ref 24–36)
aPTT: 56 seconds — ABNORMAL HIGH (ref 24–36)

## 2022-03-24 LAB — HEPARIN LEVEL (UNFRACTIONATED): Heparin Unfractionated: >1.1 IU/mL — ABNORMAL HIGH (ref 0.30–0.70)

## 2022-03-24 MED ORDER — HEPARIN BOLUS VIA INFUSION
1250.0000 [IU] | Freq: Once | INTRAVENOUS | Status: AC
  Administered 2022-03-24: 1250 [IU] via INTRAVENOUS
  Filled 2022-03-24: qty 1250

## 2022-03-24 MED ORDER — SODIUM CHLORIDE 0.9 % IV BOLUS: 250.0000 mL | Freq: Once | INTRAVENOUS | Status: DC

## 2022-03-24 MED ORDER — ORAL CARE MOUTH RINSE: 15.0000 mL | OROMUCOSAL | Status: DC | PRN

## 2022-03-24 MED ORDER — HEPARIN (PORCINE) 25000 UT/250ML-% IV SOLN
1150.0000 [IU]/h | INTRAVENOUS | Status: DC
  Administered 2022-03-24: 1000 [IU]/h via INTRAVENOUS
  Administered 2022-03-24: 700 [IU]/h via INTRAVENOUS
  Filled 2022-03-24 (×2): qty 250

## 2022-03-24 MED ORDER — HEPARIN BOLUS VIA INFUSION
3000.0000 [IU] | Freq: Once | INTRAVENOUS | Status: AC
  Administered 2022-03-24: 3000 [IU] via INTRAVENOUS
  Filled 2022-03-24: qty 3000

## 2022-03-24 MED ORDER — SODIUM CHLORIDE 0.9 % IV BOLUS: 500.0000 mL | Freq: Once | INTRAVENOUS | Status: DC

## 2022-03-24 MED ORDER — ORAL CARE MOUTH RINSE: 15.0000 mL | OROMUCOSAL | Status: DC

## 2022-03-24 MED ORDER — ORAL CARE MOUTH RINSE
15.0000 mL | OROMUCOSAL | Status: DC
  Administered 2022-03-25 – 2022-04-01 (×15): 15 mL via OROMUCOSAL

## 2022-03-24 NOTE — Consult Note (Signed)
ANTICOAGULATION CONSULT NOTE - Initial Consult  Pharmacy Consult for apixaban to heparin Indication:  elevated troponins  Allergies  Allergen Reactions   Iodinated Contrast Media Shortness Of Breath   Penicillins Anaphylaxis and Shortness Of Breath    Respiratory  Tolerated cefuroxime on 01/06/16   Strawberry Extract Anaphylaxis   Cefepime Itching    Empiric antibiotic, developed pruritis.    Erythromycin Itching   Sulfa Antibiotics Itching, Nausea And Vomiting and Nausea Only    Patient Measurements: Height: 5\' 9"  (175.3 cm) (per last admission) Weight: 85.5 kg (188 lb 7.9 oz) (per last admision) IBW/kg (Calculated) : 70.7 Heparin Dosing Weight: 85.5  Vital Signs: Temp: 97.5 F (36.4 C) (09/17 0533) BP: 91/63 (09/17 0533) Pulse Rate: 75 (09/17 0533)  Labs: Recent Labs    03/22/22 0724 03/22/22 0854 03/22/22 1655 03/22/22 2235 03/23/22 0503 03/23/22 1447 03/23/22 2309 03/24/22 0616  HGB 15.1  --   --   --  13.6  --   --  12.9*  HCT 48.2  --   --   --  41.3  --   --  39.2  PLT 339  --   --   --  227  --   --  179  APTT  --   --   --   --   --  38* 112* 148*  LABPROT  --   --   --   --  15.7*  --   --   --   INR  --   --   --   --  1.3*  --   --   --   HEPARINUNFRC  --   --   --   --   --   --   --  >1.10*  CREATININE 1.63*  --   --   --  1.36*  --   --  1.51*  TROPONINIHS 197* 677* 1,543* 1,438*  --   --   --   --      Estimated Creatinine Clearance: 54.3 mL/min (A) (by C-G formula based on SCr of 1.51 mg/dL (H)).   Medical History: Past Medical History:  Diagnosis Date   Acute pulmonary edema (HCC) 2017   Acute respiratory failure with hypoxia (HCC) 2017   Bipolar 1 disorder (HCC)    CHF (congestive heart failure) (HCC)    Chronic kidney disease    COPD (chronic obstructive pulmonary disease) (HCC)    Depression    Hypertension    Myocardial infarction [facility])    NSTEMI (non-ST elevated myocardial infarction) (HCC)    RVAD (right ventricular assist  device) present (HCC) 01/06/2016   Seizures (HCC)    childhood   Stroke (HCC)    Tobacco abuse     Medications:  Apixaban 5 mg  Assessment: Pt is a 61 yo M with PMH BPD, ICM LVEF 25-30% s/p ICD, COPD, depression, hypertension, coronary artery disease s/p CABG (2017), stroke and seizure disorder. Pt presenting with elevated troponins (197 >> 1,543 peak), as well as acute respiratory failure with hypoxia, exhibiting diffuse wheezing on exam. Pt on apixaban 5 mg twice daily PTA for paroxysmal Afib. Pt previously started on heparin yesterday at 1100 units/hr during this same admission but was only on it for 3 hrs before transitioning back to apixaban. Last dose of apixaban at 0921 on 03/23/2022.  Cards has been consulted and advises switching back to heparin drip for 48 hrs.   Baseline Labs: aPTT - 38; INR - 1.3; Hgb -  13.6; Plts - 227  Goal of Therapy:  Heparin level 0.3-0.7 units/ml / aPTT 66-102s Monitor platelets by anticoagulation protocol: Yes   Plan:  9/17 @ 0616:  aPTT = 148 (elevated ) ,  HL = >1.10 (elevated due to apixaban)  Will hold heparin drip for 1 hr and restart @ 700 units/hr. Will recheck aPTT 6 hrs after restart  Mickeal Daws D 03/24/2022 7:08 AM

## 2022-03-24 NOTE — Consult Note (Signed)
ANTICOAGULATION CONSULT NOTE - Initial Consult  Pharmacy Consult for apixaban to heparin Indication: atrial fibrillation  Allergies  Allergen Reactions   Iodinated Contrast Media Shortness Of Breath   Penicillins Anaphylaxis and Shortness Of Breath    Respiratory  Tolerated cefuroxime on 01/06/16   Strawberry Extract Anaphylaxis   Cefepime Itching    Empiric antibiotic, developed pruritis.    Erythromycin Itching   Sulfa Antibiotics Itching, Nausea And Vomiting and Nausea Only    Patient Measurements: Height: 5\' 9"  (175.3 cm) (per last admission) Weight: 85.5 kg (188 lb 7.9 oz) (per last admision) IBW/kg (Calculated) : 70.7 Heparin Dosing Weight: 85.5  Vital Signs: Temp: 97.9 F (36.6 C) (09/17 1301) Temp Source: Oral (09/17 1301) BP: 100/88 (09/17 1301) Pulse Rate: 75 (09/17 1301)  Labs: Recent Labs    03/22/22 0724 03/22/22 0854 03/22/22 1655 03/22/22 2235 03/23/22 0503 03/23/22 1447 03/23/22 2309 03/24/22 0616 03/24/22 1453  HGB 15.1  --   --   --  13.6  --   --  12.9*  --   HCT 48.2  --   --   --  41.3  --   --  39.2  --   PLT 339  --   --   --  227  --   --  179  --   APTT  --   --   --   --   --    < > 112* 148* 44*  LABPROT  --   --   --   --  15.7*  --   --   --   --   INR  --   --   --   --  1.3*  --   --   --   --   HEPARINUNFRC  --   --   --   --   --   --   --  >1.10*  --   CREATININE 1.63*  --   --   --  1.36*  --   --  1.51*  --   TROPONINIHS 197* 677* 1,543* 1,438*  --   --   --   --   --    < > = values in this interval not displayed.     Estimated Creatinine Clearance: 54.3 mL/min (A) (by C-G formula based on SCr of 1.51 mg/dL (H)).   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 Pt presenting with elevated troponins (197 >> 1,543 peak), as well as acute respiratory failure with hypoxia, exhibiting diffuse wheezing on exam. Pt on apixaban 5 mg twice daily PTA for paroxysmal Afib. Pt previously started on heparin yesterday at 1100 units/hr during this same admission but was  only on it for 3 hrs before transitioning back to apixaban. Last dose of apixaban at 0921 on 03/23/2022.  Cards has been consulted and advises switching back to heparin drip for 48 hrs.   Baseline Labs: aPTT - 38; INR - 1.3; Hgb - 13.6; Plts - 227 9/17 @ 0616:  aPTT = 148 (elevated ) ,  HL = >1.10 (elevated due to apixaban)    Goal of Therapy:  Heparin level 0.3-0.7 units/ml / aPTT 66-102s Monitor platelets by anticoagulation protocol: Yes   Plan: Spoke with RN- reports heparin has been running continuously. Bolus heparin 3,000 units Increase heparin to 1000 units/hr Recheck aPTT 6 hours after rate change   Glean Salvo, PharmD Clinical Pharmacist  03/24/2022 3:25 PM

## 2022-03-24 NOTE — Progress Notes (Addendum)
Tested + for COVID 19.  BP low overnight at 76/46mMHg but now stable at 100/12mmHg.  O2 sats 98% on RA.  No further CP since admission. Spiked temp yesterday to 100.4. continues to diurese well putting out 1.7L yesterday and is net neg 3.1L since admit .  No weights documented. SCr bumped from 1.36 yesterday to 1.51 today and likely over diuresed given low BP overnight.  Hold BB.  hsTrop bumped from 677 to 1438 likely related to demand ischemia in the setting of hypotension, acute hypoxemic respiratory failure, fever and COVID 19. Diuretics stopped and on Midodrine.  O2 sats 98% on 2L.

## 2022-03-24 NOTE — Consult Note (Signed)
ANTICOAGULATION CONSULT NOTE - Initial Consult  Pharmacy Consult for apixaban to heparin Indication:  elevated troponins  Allergies  Allergen Reactions   Iodinated Contrast Media Shortness Of Breath   Penicillins Anaphylaxis and Shortness Of Breath    Respiratory  Tolerated cefuroxime on 01/06/16   Strawberry Extract Anaphylaxis   Cefepime Itching    Empiric antibiotic, developed pruritis.    Erythromycin Itching   Sulfa Antibiotics Itching, Nausea And Vomiting and Nausea Only    Patient Measurements: Height: 5\' 9"  (175.3 cm) (per last admission) Weight: 85.5 kg (188 lb 7.9 oz) (per last admision) IBW/kg (Calculated) : 70.7 Heparin Dosing Weight: 85.5  Vital Signs: Temp: 97.9 F (36.6 C) (09/16 2351) Temp Source: Oral (09/16 1630) BP: 90/62 (09/16 2351) Pulse Rate: 78 (09/16 2351)  Labs: Recent Labs    03/21/22 0328 03/21/22 0328 03/22/22 0724 03/22/22 0854 03/22/22 1655 03/22/22 2235 03/23/22 0503 03/23/22 1447 03/23/22 2309  HGB 12.2*  --  15.1  --   --   --  13.6  --   --   HCT 37.0*  --  48.2  --   --   --  41.3  --   --   PLT 258  --  339  --   --   --  227  --   --   APTT  --   --   --   --   --   --   --  38* 112*  LABPROT  --   --   --   --   --   --  15.7*  --   --   INR  --   --   --   --   --   --  1.3*  --   --   CREATININE  --   --  1.63*  --   --   --  1.36*  --   --   TROPONINIHS  --    < > 197* 677* 1,543* 1,438*  --   --   --    < > = values in this interval not displayed.     Estimated Creatinine Clearance: 60.2 mL/min (A) (by C-G formula based on SCr of 1.36 mg/dL (H)).   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 Pt presenting with elevated troponins (197 >> 1,543 peak), as well as acute respiratory failure with hypoxia, exhibiting diffuse wheezing on exam. Pt on apixaban 5 mg twice daily PTA for paroxysmal Afib. Pt previously started on heparin yesterday at 1100 units/hr during this same admission but was only on it for 3 hrs before transitioning back to apixaban. Last dose of apixaban at 0921 on 03/23/2022.  Cards has  been consulted and advises switching back to heparin drip for 48 hrs.   Baseline Labs: aPTT - 38; INR - 1.3; Hgb - 13.6; Plts - 227  Goal of Therapy:  Heparin level 0.3-0.7 units/ml / aPTT 66-102s Monitor platelets by anticoagulation protocol: Yes   Plan:  9/16:  aPTT @ 2309 = 112, elevated  Will decrease heparin drip to 1000 units/hr and recheck aPTT and HL 6 hrs after rate change.  Titrate by aPTT's until lab correlation is noted, then titrate by HL alone. Continue to monitor H&H and platelets daily while on heparin gtt.  Dara Hoyer, PharmD PGY-1 Pharmacy Resident 03/24/2022 12:00 AM

## 2022-03-24 NOTE — Progress Notes (Signed)
   03/24/22 0342  Assess: MEWS Score  Temp 97.7 F (36.5 C)  BP (!) 76/50  MAP (mmHg) (!) 59  Pulse Rate 82  SpO2 98 %  O2 Device Nasal Cannula  O2 Flow Rate (L/min) 2 L/min  Assess: MEWS Score  MEWS Temp 0  MEWS Systolic 2  MEWS Pulse 0  MEWS RR 0  MEWS LOC 0  MEWS Score 2  MEWS Score Color Yellow  Assess: if the MEWS score is Yellow or Red  Were vital signs taken at a resting state? Yes  Focused Assessment No change from prior assessment  Does the patient meet 2 or more of the SIRS criteria? No  Treat  Pain Scale 0-10  Pain Score 0  Take Vital Signs  Increase Vital Sign Frequency  Yellow: Q 2hr X 2 then Q 4hr X 2, if remains yellow, continue Q 4hrs  Escalate  MEWS: Escalate Yellow: discuss with charge nurse/RN and consider discussing with provider and RRT  Notify: Charge Nurse/RN  Name of Charge Nurse/RN Notified Marcene Brawn  Date Charge Nurse/RN Notified 03/24/22  Time Charge Nurse/RN Notified 0351  Notify: Provider  Provider Name/Title Sharion Settler  Date Provider Notified 03/24/22  Time Provider Notified [phone]  Method of Notification Page  Notification Reason Other (Comment) (low blood prssure)  Assess: SIRS CRITERIA  SIRS Temperature  0  SIRS Pulse 0  SIRS Respirations  0  SIRS WBC 1  SIRS Score Sum  1

## 2022-03-24 NOTE — Consult Note (Signed)
ANTICOAGULATION CONSULT NOTE - Initial Consult  Pharmacy Consult for apixaban to heparin Indication: atrial fibrillation  Allergies  Allergen Reactions   Iodinated Contrast Media Shortness Of Breath   Penicillins Anaphylaxis and Shortness Of Breath    Respiratory  Tolerated cefuroxime on 01/06/16   Strawberry Extract Anaphylaxis   Cefepime Itching    Empiric antibiotic, developed pruritis.    Erythromycin Itching   Sulfa Antibiotics Itching, Nausea And Vomiting and Nausea Only    Patient Measurements: Height: 5\' 9"  (175.3 cm) Weight: 85.5 kg (188 lb 7.9 oz) (per last admision) IBW/kg (Calculated) : 70.7 Heparin Dosing Weight: 85.5  Vital Signs: Temp: 98 F (36.7 C) (09/17 2045) Temp Source: Oral (09/17 2045) BP: 120/82 (09/17 2045) Pulse Rate: 84 (09/17 2045)  Labs: Recent Labs    03/22/22 0724 03/22/22 0854 03/22/22 1655 03/22/22 2235 03/23/22 0503 03/23/22 1447 03/24/22 0616 03/24/22 1453 03/24/22 2216  HGB 15.1  --   --   --  13.6  --  12.9*  --   --   HCT 48.2  --   --   --  41.3  --  39.2  --   --   PLT 339  --   --   --  227  --  179  --   --   APTT  --   --   --   --   --    < > 148* 44* 56*  LABPROT  --   --   --   --  15.7*  --   --   --   --   INR  --   --   --   --  1.3*  --   --   --   --   HEPARINUNFRC  --   --   --   --   --   --  >1.10*  --   --   CREATININE 1.63*  --   --   --  1.36*  --  1.51*  --   --   TROPONINIHS 197* 677* 1,543* 1,438*  --   --   --   --   --    < > = values in this interval not displayed.     Estimated Creatinine Clearance: 54.3 mL/min (A) (by C-G formula based on SCr of 1.51 mg/dL (H)).   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 Pt presenting with elevated troponins (197 >> 1,543 peak), as well as acute respiratory failure with hypoxia, exhibiting diffuse wheezing on exam. Pt on apixaban 5 mg twice daily PTA for paroxysmal Afib. Pt previously started on heparin yesterday at 1100 units/hr during this same admission but was only on it  for 3 hrs before transitioning back to apixaban. Last dose of apixaban at 0921 on 03/23/2022.  Cards has been consulted and advises switching back to heparin drip for 48 hrs.   Baseline Labs: aPTT - 38; INR - 1.3; Hgb - 13.6; Plts - 227 9/17 @ 0616:  aPTT = 148 (elevated ) ,  HL = >1.10 (elevated due to apixaban)  9/17 @ 2216:  aPTT = 56, SUBtherapeutic    Goal of Therapy:  Heparin level 0.3-0.7 units/ml / aPTT 66-102s Monitor platelets by anticoagulation protocol: Yes   Plan:  9/17:  aPTT @ 2216 = 56, subtherapeutic  Will order heparin 1250 units IV X 1 bolus and increase drip rate to 1150 units/hr.  Will recheck aPTT and HL 6 hrs after rate change.   Ravon Mortellaro D Clinical Pharmacist  03/24/2022 11:14 PM

## 2022-03-24 NOTE — Progress Notes (Signed)
[facility] Progress Note  Patient: Carl [name]    YPP:[id number]  DOA: 03/22/2022    Date of Service: the patient was seen and examined on 03/24/2022  Brief hospital course: 61 year old male with past medical history of bipolar disorder, chronic systolic heart failure with ejection fraction of 25%, CAD status post non-STEMI, CVA, seizure disorder and COPD with recent hospitalization for CHF and COPD exacerbations who presented to the emergency room on 9/15 with shortness of breath and chest pain and found to have hypoxia.  Patient was admitted for CHF exacerbation.  Also noted to have elevated troponins and cardiology consulted.  After patient admitted, COVID PCR came back positive.  Patient started on IV Remdisivir and IV steroids  Assessment and Plan: Assessment and Plan: * Elevated troponin Felt to more demand ischemia in the setting of respiratory issues, heart failure COVID.  Cardiology following.  Acute respiratory failure with hypoxia (HCC) Multifactorial felt to be secondary to COVID, CHF and COPD.  Has been able to be weaned down from BiPAP and is currently down to 2 L nasal cannula  Acute on chronic systolic CHF (congestive heart failure) ([place]) Last echo with EF 23%. Patient presents with increased SOB with rales on exam and rising BNP.  Responding to IV Lasix and has diuresed over 4 L and is more than -3 L deficient  COPD exacerbation (HCC) Continue nebulizers and have added steroids.   COVID-19 virus infection Given hypoxia, on Remdisivir and Solu-Medrol.  CRP pending  Hypotension Currently having some issues with hypotension, possibly from diuresis.  Checking lactic acid to ensure no signs of sepsis.  In the meantime, have started some midodrine.  We will like to hold off on IV fluids given CHF.  Acute renal failure superimposed on stage 3a chronic kidney disease ([place]) Baseline around 1.1.  Increasing creatinine today, likely from Lasix  Bipolar 1 disorder  [facility]) Patient with psych issues. Has also suffered multifactorial encephalopathy. Social Worker states that he is under guardianship and has been deemed incompetent to make medical decisions. Therefore, DSS needs to grant consent for any procedures contemplated. The social worker also stated that he is not competent to make a decision regarding Code status although the patient states he would want resuscitation.  We will plan to continue the patient's psychiatric medications.  Paroxysmal atrial fibrillation (HCC) Patient in fib/flutter on EKG. Eliquis on hold due to IV heparin  Seizure ([place]) Continue home meds. Seizure precautions  Coronary artery disease Patient with known CAD. S/P CABG. He reports that since being home from hospital 03/21/22 he has had increased chest pain - a heavy pressure in his chest. Troponins: 197 to 677. No acute changes on EKG but LBBB and pacer limit interpretation.   Plan Heparin IV for ACS with history, symptoms and rising Troponin  Cardilology consult: patient followed by Dr. Fletcher Anon.  Continue home cardiac meds  HLD (hyperlipidemia) Continue statin therapy  Hypothyroidism Continue Synthroid  Overweight (BMI 25.0-29.9) Meets criteria BMI greater than 25       Body mass index is 27.84 kg/m.        Consultants: Cardiology  Procedures: None  Antimicrobials: IV Remdisivir 9/16-present  Code Status: Full code   Subjective: Patient states breathing is better  Objective: Few episodes of hypotension Vitals:   03/24/22 1024 03/24/22 1301  BP: (!) 89/70 100/88  Pulse: 93 75  Resp: 20 20  Temp:  97.9 F (36.6 C)  SpO2: 97% 98%    Intake/Output Summary (Last 24 hours)  at 03/24/2022 1414 Last data filed at 03/23/2022 1800 Gross per 24 hour  Intake 22.42 ml  Output 750 ml  Net -727.58 ml    Filed Weights   03/22/22 1100  Weight: 85.5 kg   Body mass index is 27.84 kg/m.  Exam:  General: Oriented x2, no acute distress HEENT:  Normocephalic, atraumatic, mucous membranes slightly dry Cardiovascular: Regular rate and rhythm, S1-S2, 2 out of 6 systolic ejection murmur Respiratory: Mild end expiratory wheeze Abdomen: Soft, nontender, nondistended, positive bowel sounds Musculoskeletal: No clubbing or cyanosis, trace pitting edema Skin: Skin breaks, tears or lesions Psychiatry: Patient has underlying bipolar disorder and is not felt to be competent to make his own decisions. Neurology: No focal deficits  Data Reviewed: Noted worsening creatinine.  CRP pending  Disposition:  Status is: Inpatient Remains inpatient appropriate because:  -Diuresis -Remdisivir for COVID -Treatment of hypoxia    Anticipated discharge date: 9/19  Family Communication: Patient is a ward of the state.  He told me if I needed to contact anyone, to contact his caseworker.  We will contact on Monday DVT Prophylaxis:   Heparin    Author: Annita Brod ,MD 03/24/2022 2:14 PM  To reach On-call, see care teams to locate the attending and reach out via [email or web address]. Between 7PM-7AM, please contact night-coverage If you still have difficulty reaching the attending provider, please page the [facility] (Director on Call) for [facility] on amion for assistance.

## 2022-03-25 DIAGNOSIS — E1165 Type 2 diabetes mellitus with hyperglycemia: Secondary | ICD-10-CM

## 2022-03-25 DIAGNOSIS — I5023 Acute on chronic systolic (congestive) heart failure: Secondary | ICD-10-CM | POA: Diagnosis not present

## 2022-03-25 DIAGNOSIS — R778 Other specified abnormalities of plasma proteins: Secondary | ICD-10-CM | POA: Diagnosis not present

## 2022-03-25 DIAGNOSIS — N170 Acute kidney failure with tubular necrosis: Secondary | ICD-10-CM | POA: Diagnosis not present

## 2022-03-25 DIAGNOSIS — J9601 Acute respiratory failure with hypoxia: Secondary | ICD-10-CM | POA: Diagnosis not present

## 2022-03-25 DIAGNOSIS — U071 COVID-19: Secondary | ICD-10-CM | POA: Diagnosis not present

## 2022-03-25 LAB — CBC
HCT: 39.7 % (ref 39.0–52.0)
Hemoglobin: 13 g/dL (ref 13.0–17.0)
MCH: 28.7 pg (ref 26.0–34.0)
MCHC: 32.7 g/dL (ref 30.0–36.0)
MCV: 87.6 fL (ref 80.0–100.0)
Platelets: 175 10*3/uL (ref 150–400)
RBC: 4.53 MIL/uL (ref 4.22–5.81)
RDW: 17.5 % — ABNORMAL HIGH (ref 11.5–15.5)
WBC: 14.6 10*3/uL — ABNORMAL HIGH (ref 4.0–10.5)
nRBC: 0.3 % — ABNORMAL HIGH (ref 0.0–0.2)

## 2022-03-25 LAB — GLUCOSE, CAPILLARY: Glucose-Capillary: 284 mg/dL — ABNORMAL HIGH (ref 70–99)

## 2022-03-25 LAB — BASIC METABOLIC PANEL
Anion gap: 9 (ref 5–15)
BUN: 51 mg/dL — ABNORMAL HIGH (ref 8–23)
CO2: 26 mmol/L (ref 22–32)
Calcium: 9.3 mg/dL (ref 8.9–10.3)
Chloride: 96 mmol/L — ABNORMAL LOW (ref 98–111)
Creatinine, Ser: 1.34 mg/dL — ABNORMAL HIGH (ref 0.61–1.24)
GFR, Estimated: 60 mL/min — ABNORMAL LOW (ref >60)
Glucose, Bld: 352 mg/dL — ABNORMAL HIGH (ref 70–99)
Potassium: 4.9 mmol/L (ref 3.5–5.1)
Sodium: 131 mmol/L — ABNORMAL LOW (ref 135–145)

## 2022-03-25 LAB — HEPARIN LEVEL (UNFRACTIONATED): Heparin Unfractionated: 0.97 IU/mL — ABNORMAL HIGH (ref 0.30–0.70)

## 2022-03-25 LAB — APTT
aPTT: 145 seconds — ABNORMAL HIGH (ref 24–36)
aPTT: 80 seconds — ABNORMAL HIGH (ref 24–36)
aPTT: 85 seconds — ABNORMAL HIGH (ref 24–36)

## 2022-03-25 LAB — C-REACTIVE PROTEIN: CRP: 11.7 mg/dL — ABNORMAL HIGH (ref <1.0)

## 2022-03-25 MED ORDER — ENSURE ENLIVE PO LIQD
237.0000 mL | Freq: Three times a day (TID) | ORAL | Status: DC
  Administered 2022-03-25 – 2022-03-28 (×10): 237 mL via ORAL

## 2022-03-25 MED ORDER — METHYLPREDNISOLONE SODIUM SUCC 40 MG IJ SOLR
30.0000 mg | Freq: Two times a day (BID) | INTRAMUSCULAR | Status: DC
  Filled 2022-03-25: qty 1

## 2022-03-25 MED ORDER — HEPARIN (PORCINE) 25000 UT/250ML-% IV SOLN
1000.0000 [IU]/h | INTRAVENOUS | Status: DC
  Administered 2022-03-25 (×2): 1000 [IU]/h via INTRAVENOUS
  Filled 2022-03-25: qty 250

## 2022-03-25 MED ORDER — PREDNISONE 50 MG PO TABS
50.0000 mg | ORAL_TABLET | Freq: Every day | ORAL | Status: DC
  Administered 2022-03-26: 50 mg via ORAL
  Filled 2022-03-25: qty 1

## 2022-03-25 MED ORDER — INSULIN ASPART 100 UNIT/ML IJ SOLN
0.0000 [IU] | Freq: Three times a day (TID) | INTRAMUSCULAR | Status: DC
  Administered 2022-03-26: 8 [IU] via SUBCUTANEOUS
  Administered 2022-03-26: 11 [IU] via SUBCUTANEOUS
  Administered 2022-03-27 (×2): 3 [IU] via SUBCUTANEOUS
  Administered 2022-03-27: 8 [IU] via SUBCUTANEOUS
  Administered 2022-03-28 (×2): 11 [IU] via SUBCUTANEOUS
  Administered 2022-03-29: 3 [IU] via SUBCUTANEOUS
  Administered 2022-03-29: 8 [IU] via SUBCUTANEOUS
  Administered 2022-03-30: 5 [IU] via SUBCUTANEOUS
  Administered 2022-03-30: 8 [IU] via SUBCUTANEOUS
  Administered 2022-03-30 – 2022-03-31 (×2): 2 [IU] via SUBCUTANEOUS
  Administered 2022-03-31: 8 [IU] via SUBCUTANEOUS
  Administered 2022-04-01: 3 [IU] via SUBCUTANEOUS
  Administered 2022-04-01: 2 [IU] via SUBCUTANEOUS
  Filled 2022-03-25 (×14): qty 1

## 2022-03-25 MED ORDER — INSULIN ASPART 100 UNIT/ML IJ SOLN
0.0000 [IU] | Freq: Every day | INTRAMUSCULAR | Status: DC
  Administered 2022-03-25: 3 [IU] via SUBCUTANEOUS
  Administered 2022-03-26: 2 [IU] via SUBCUTANEOUS
  Administered 2022-03-28: 3 [IU] via SUBCUTANEOUS
  Filled 2022-03-25 (×3): qty 1

## 2022-03-25 MED ORDER — ADULT MULTIVITAMIN W/MINERALS CH
1.0000 | ORAL_TABLET | Freq: Every day | ORAL | Status: DC
  Administered 2022-03-25 – 2022-04-01 (×8): 1 via ORAL
  Filled 2022-03-25 (×8): qty 1

## 2022-03-25 NOTE — Progress Notes (Signed)
[facility] Progress Note  Patient: Carl [name]    VWU:[id number]  DOA: 03/22/2022    Date of Service: the patient was seen and examined on 03/25/2022  Brief hospital course: 61 year old male with past medical history of bipolar disorder, chronic systolic heart failure with ejection fraction of 25%, CAD status post non-STEMI, CVA, seizure disorder and COPD with recent hospitalization for CHF and COPD exacerbations who presented to the emergency room on 9/15 with shortness of breath and chest pain and found to have hypoxia.  Patient was admitted for CHF exacerbation.  Also noted to have elevated troponins and cardiology consulted.  After patient admitted, COVID PCR came back positive.  Patient started on IV Remdisivir and IV steroids.  Over the next few days, patient's oxygenation has improved.  Initially was on BiPAP and has been able to be weaned off of oxygen to room air.  Patient has diuresed almost 7 L of fluid and is over -3 L deficient and down 6 pounds.  Assessment and Plan: Assessment and Plan: * Elevated troponin Felt to more demand ischemia in the setting of respiratory issues, heart failure COVID.  Cardiology following.  Acute respiratory failure with hypoxia (HCC) Multifactorial felt to be secondary to COVID, CHF and COPD.  Able to be weaned off of oxygen altogether.  Acute on chronic systolic CHF (congestive heart failure) (HCC) Last echo with EF 23%. Patient presents with increased SOB with rales on exam and rising BNP.  Responding to IV Lasix and has diuresed over 4 L and is more than -3 L deficient  COPD exacerbation (HCC) Continue nebulizers and have added steroids.  Continue taper.   COVID-19 virus infection Given hypoxia, started on Remdisivir and Solu-Medrol.  CRP level trending down.  Remdisivir completed 9/18.  Solu-Medrol changed over to p.o. prednisone for quick taper.  Although patient has improved, he will need to stay here until Monday, 9/25, where he will  complete a 10-day isolation for COVID  Hypotension Currently having some issues with hypotension, possibly from diuresis.  Checking lactic acid to ensure no signs of sepsis.  In the meantime, have started some midodrine.  We will like to hold off on IV fluids given CHF.  Acute renal failure superimposed on stage 3a chronic kidney disease ([place]) Baseline around 1.1.  Creatinine briefly rose, however starting to trend downward.  Bipolar 1 disorder [facility]) Patient with psych issues. Has also suffered multifactorial encephalopathy. Social Worker states that he is under guardianship and has been deemed incompetent to make medical decisions. Therefore, DSS needs to grant consent for any procedures contemplated. The social worker also stated that he is not competent to make a decision regarding Code status although the patient states he would want resuscitation.  We will plan to continue the patient's psychiatric medications.  Type 2 diabetes mellitus with hyperglycemia (HCC) Worsening blood sugars due to being on Solu-Medrol plus underlying illness.  No recent A1c on chart so we will check.  Appreciate diabetes educator help.  Have added sliding scale.  CBGs should improve as we are continue to trend his steroids down.  Patient not on any home medications.  Paroxysmal atrial fibrillation (HCC) Patient in fib/flutter on EKG. Eliquis on hold due to IV heparin  Seizure ([place]) Continue home meds. Seizure precautions  Coronary artery disease Patient with known CAD. S/P CABG. He reports that since being home from hospital 03/21/22 he has had increased chest pain - a heavy pressure in his chest. Troponins: 197 to 677. No acute changes  on EKG but LBBB and pacer limit interpretation.  As per cardiology, patient has been on IV heparin for 48 hours.  HLD (hyperlipidemia) Continue statin therapy  Hypothyroidism Continue Synthroid  Overweight (BMI 25.0-29.9) Meets criteria BMI greater than 25       Body  mass index is 26.92 kg/m.  Nutrition Problem: Increased nutrient needs Etiology: chronic illness (CHF, COPD)     Consultants: Cardiology  Procedures: None  Antimicrobials: IV Remdisivir 9/16-present  Code Status: Full code   Subjective: Patient states breathing is better  Objective: Few episodes of hypotension Vitals:   03/25/22 1240 03/25/22 1705  BP: 108/73 125/72  Pulse: 85 86  Resp: 16 20  Temp: 97.7 F (36.5 C)   SpO2: 96% 97%    Intake/Output Summary (Last 24 hours) at 03/25/2022 1709 Last data filed at 03/25/2022 1507 Gross per 24 hour  Intake 2456.09 ml  Output 1650 ml  Net 806.09 ml    Filed Weights   03/22/22 1100 03/25/22 0500  Weight: 85.5 kg 82.7 kg   Body mass index is 26.92 kg/m.  Exam:  General: Oriented x2, no acute distress HEENT: Normocephalic, atraumatic, mucous membranes slightly dry Cardiovascular: Regular rate and rhythm, S1-S2, 2 out of 6 systolic ejection murmur Respiratory: Mild end expiratory wheeze Abdomen: Soft, nontender, nondistended, positive bowel sounds Musculoskeletal: No clubbing or cyanosis, trace pitting edema Skin: Skin breaks, tears or lesions Psychiatry: Patient has underlying bipolar disorder and is not felt to be competent to make his own decisions. Neurology: No focal deficits  Data Reviewed: Improving creatinine  Disposition:  Status is: Inpatient Remains inpatient appropriate because:  -Completion of 10-day isolation for COVID    Anticipated discharge date: 9/24  Family Communication: Patient is a ward of the state.  He told me if I needed to contact anyone, to contact his caseworker.  We will contact on Monday DVT Prophylaxis:   Heparin    Author: Annita Brod ,MD 03/25/2022 5:09 PM  To reach On-call, see care teams to locate the attending and reach out via [email or web address]. Between 7PM-7AM, please contact night-coverage If you still have difficulty reaching the attending provider, please  page the [facility] (Director on Call) for [facility] on amion for assistance.

## 2022-03-25 NOTE — Progress Notes (Signed)
ANTICOAGULATION CONSULT NOTE - FOLLOW UP  Pharmacy Consult for apixaban to heparin Indication: atrial fibrillation  Allergies  Allergen Reactions   Iodinated Contrast Media Shortness Of Breath   Penicillins Anaphylaxis and Shortness Of Breath    Respiratory  Tolerated cefuroxime on 01/06/16   Strawberry Extract Anaphylaxis   Cefepime Itching    Empiric antibiotic, developed pruritis.    Erythromycin Itching   Sulfa Antibiotics Itching, Nausea And Vomiting and Nausea Only    Patient Measurements: Height: 5\' 9"  (175.3 cm) Weight: 82.7 kg (182 lb 5.1 oz) IBW/kg (Calculated) : 70.7 Heparin Dosing Weight: 82.7  Vital Signs: Temp: 98 F (36.7 C) (09/18 0500) Temp Source: Oral (09/18 0500) BP: 129/76 (09/18 0500) Pulse Rate: 95 (09/18 0500)  Labs: Recent Labs    03/22/22 0854 03/22/22 1655 03/22/22 2235 03/23/22 0503 03/23/22 1447 03/24/22 0616 03/24/22 1453 03/24/22 2216 03/25/22 0700  HGB  --   --   --  13.6  --  12.9*  --   --   --   HCT  --   --   --  41.3  --  39.2  --   --   --   PLT  --   --   --  227  --  179  --   --   --   APTT  --   --   --   --    < > 148* 44* 56* 145*  LABPROT  --   --   --  15.7*  --   --   --   --   --   INR  --   --   --  1.3*  --   --   --   --   --   HEPARINUNFRC  --   --   --   --   --  >1.10*  --   --  0.97*  CREATININE  --   --   --  1.36*  --  1.51*  --   --  1.34*  TROPONINIHS 677* 1,543* 1,438*  --   --   --   --   --   --    < > = values in this interval not displayed.    Estimated Creatinine Clearance: 56.4 mL/min (A) (by C-G formula based on SCr of 1.34 mg/dL (H)).  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 Pt presenting with elevated troponins (197 >> 1,543 peak), as well as acute respiratory failure with hypoxia, exhibiting diffuse wheezing on exam. Pt on apixaban 5 mg twice daily PTA for paroxysmal Afib. Pt previously started on heparin yesterday at 1100 units/hr during this same admission but was only on it for  3 hrs before transitioning back to apixaban. Last dose of apixaban at 0921 on 03/23/2022.  Cards has been consulted and advises switching back to heparin drip for 48 hrs.   Baseline Labs: aPTT - 38; INR - 1.3; Hgb - 13.6; Plts - 227  Goal of Therapy:  Heparin level 0.3-0.7 units/ml aPTT : 66-102s Monitor platelets by anticoagulation protocol: Yes  Results: Date/time aPTT/HL  Comments 9/17 @ 0616   aPTT = 148  HL = >1.10 (elevated due to apixaban)  9/17 @ 2216   aPTT = 56  Subtherapeutic 9/18@0700  aPTT = 145  HL remains elevated, off apixaban for <48hrs   Plan:   aPTT supra-therapeutic at 1150 un/hr, will HOLD heparin infusion for 1 hr, then resume at 1000 un/hr Will recheck aPTT 6 hrs after rate change Plan to check aPTT, HL, and CBC with AM labs  Hernando Reali Rodriguez-Guzman PharmD, BCPS 03/25/2022 7:53 AM

## 2022-03-25 NOTE — Discharge Instructions (Signed)

## 2022-03-25 NOTE — Assessment & Plan Note (Addendum)
Worsening blood sugars due to being on Solu-Medrol plus underlying illness.  No recent A1c on chart so we will check.  Appreciate diabetes educator help.  Lantus added.  CBGs should improve as we are continue to trend his steroids down.  A1c at 6.6.

## 2022-03-25 NOTE — Progress Notes (Signed)
[place] for apixaban to heparin Indication: atrial fibrillation  Allergies  Allergen Reactions   Iodinated Contrast Media Shortness Of Breath   Penicillins Anaphylaxis and Shortness Of Breath    Respiratory  Tolerated cefuroxime on 01/06/16   Strawberry Extract Anaphylaxis   Cefepime Itching    Empiric antibiotic, developed pruritis.    Erythromycin Itching   Sulfa Antibiotics Itching, Nausea And Vomiting and Nausea Only    Patient Measurements: Height: 5\' 9"  (175.3 cm) Weight: 82.7 kg (182 lb 5.1 oz) IBW/kg (Calculated) : 70.7 Heparin Dosing Weight: 82.7  Vital Signs: Temp: 97.7 F (36.5 C) (09/18 2001) Temp Source: Oral (09/18 2001) BP: 118/77 (09/18 2001) Pulse Rate: 77 (09/18 2001)  Labs: Recent Labs    03/23/22 0503 03/23/22 1447 03/24/22 0616 03/24/22 1453 03/25/22 0700 03/25/22 1619 03/25/22 2221  HGB 13.6  --  12.9*  --  13.0  --   --   HCT 41.3  --  39.2  --  39.7  --   --   PLT 227  --  179  --  175  --   --   APTT  --    < > 148*   < > 145* 80* 85*  LABPROT 15.7*  --   --   --   --   --   --   INR 1.3*  --   --   --   --   --   --   HEPARINUNFRC  --   --  >1.10*  --  0.97*  --   --   CREATININE 1.36*  --  1.51*  --  1.34*  --   --    < > = values in this interval not displayed.    Estimated Creatinine Clearance: 56.4 mL/min (A) (by C-G formula based on SCr of 1.34 mg/dL (H)).  Medical History: Past Medical History:  Diagnosis Date   Acute pulmonary edema ([place]) 2017   Acute respiratory failure with hypoxia ([place]) 2017   Bipolar 1 disorder (HCC)    CHF (congestive heart failure) (HCC)    Chronic kidney disease    COPD (chronic obstructive pulmonary disease) (HCC)    Depression    Hypertension    Myocardial infarction [facility])    NSTEMI (non-ST elevated myocardial infarction) ([place])    RVAD (right ventricular assist device) present ([place]) 01/06/2016   Seizures ([place])    childhood   Stroke ([place])    Tobacco  abuse     Medications:  Apixaban 5 mg  Assessment: Pt is a 61 yo M with PMH BPD, ICM LVEF 25-30% s/p ICD, COPD, depression, hypertension, coronary artery disease s/p CABG (2017), stroke and seizure disorder. Pt presenting with elevated troponins (197 >> 1,543 peak), as well as acute respiratory failure with hypoxia, exhibiting diffuse wheezing on exam. Pt on apixaban 5 mg twice daily PTA for paroxysmal Afib. Pt previously started on heparin yesterday at 1100 units/hr during this same admission but was only on it for 3 hrs before transitioning back to apixaban. Last dose of apixaban at 0921 on 03/23/2022.  Cards has been consulted and advises switching back to heparin drip for 48 hrs.   Baseline Labs: aPTT - 38; INR - 1.3; Hgb - 13.6; Plts - 227  Goal of Therapy:  Heparin level 0.3-0.7 units/ml aPTT : 66-102s Monitor platelets by anticoagulation protocol: Yes  Results: Date/time aPTT/HL  Comments 9/17 @ 0616   aPTT = 148  HL = >1.10 (elevated  due to apixaban)  9/17 @ 2216   aPTT = 56  Subtherapeutic 9/18@0700  aPTT = 145  HL remains elevated, off apixaban for <48hrs 9/18 2221 aPTT 85  Therapeutic x 2   Plan:  Continue heparin infusion at 1000 un/hr Recheck aPTT daily w/ AM labs while therapeutic Plan to check aPTT, HL, and CBC with AM labs  Otelia Sergeant, PharmD, [facility] 03/25/2022 11:26 PM

## 2022-03-25 NOTE — TOC Progression Note (Signed)
Transition of Care [facility]) - Progression Note    Patient Details  Name: Carl [name] MRN: [id number] Date of Birth: 11/14/1960  Transition of Care [facility]) CM/SW [place], [place] Phone Number: 03/25/2022, 10:07 AM  Clinical Narrative:     Patient recently tested positive for COVID 9/15, per Madelynn Done at [place] [facility] he will need to stay in hospital for 10 day quarantine period and can return 9/26 on day 11.   TOC will continue to follow for needs.  Expected Discharge Plan: Assisted Living Barriers to Discharge: Continued Medical Work up  Expected Discharge Plan and Services Expected Discharge Plan: Assisted Living   Discharge Planning Services: CM Consult   Living arrangements for the past 2 months: Assisted Living Facility                                       Social Determinants of Health (SDOH) Interventions    Readmission Risk Interventions    03/15/2022    2:44 PM  Readmission Risk Prevention Plan  HRI or [place] Complete  Social Work Consult for [place] Planning/Counseling Complete  Palliative Care Screening Not Applicable  Medication Review [employer]) Complete

## 2022-03-25 NOTE — Progress Notes (Signed)
Rounding Note    Patient Name: Carl [name] Date of Encounter: 03/25/2022  [employer] Cardiologist: Kathlyn Sacramento, MD   Subjective   Patient seen on a.m. rounds.  Blood pressures are improving but still has occasions of soft BPs.  Unsure of current intake and output.  After being weaned off of BiPAP has been maintaining O2 sats of 98 to 99% on 2 L via nasal cannula.  Kidney function slowly improving.  Inpatient Medications    Scheduled Meds:  atorvastatin  40 mg Oral Daily   divalproex  500 mg Oral BID   escitalopram  10 mg Oral QHS   feeding supplement  237 mL Oral BID BM   hydrOXYzine  10 mg Oral BID   levETIRAcetam  250 mg Oral BID   levothyroxine  25 mcg Oral Daily   methylPREDNISolone (SOLU-MEDROL) injection  40 mg Intravenous Q12H   midodrine  10 mg Oral TID WC   mouth rinse  15 mL Mouth Rinse 4 times per day   pantoprazole  80 mg Oral Daily   QUEtiapine  200 mg Oral QHS   tiotropium  18 mcg Inhalation Daily   Continuous Infusions:  heparin     remdesivir 100 mg in sodium chloride 0.9 % 100 mL IVPB 100 mg (03/24/22 1033)   PRN Meds: acetaminophen, albuterol, mouth rinse, traMADol   Vital Signs    Vitals:   03/24/22 2045 03/24/22 2326 03/25/22 0500 03/25/22 0901  BP: 120/82 94/64 129/76 (!) 108/59  Pulse: 84 76 95 85  Resp: 20 18 (!) 21 18  Temp: 98 F (36.7 C) 98.1 F (36.7 C) 98 F (36.7 C) 97.6 F (36.4 C)  TempSrc: Oral Oral Oral   SpO2: 99% 98% 98% 99%  Weight:   82.7 kg   Height: 5\' 9"  (1.753 m)       Intake/Output Summary (Last 24 hours) at 03/25/2022 0932 Last data filed at 03/25/2022 0908 Gross per 24 hour  Intake 1742.09 ml  Output 2100 ml  Net -357.91 ml      03/25/2022    5:00 AM 03/22/2022   11:00 AM 03/15/2022   12:41 AM  Last 3 Weights  Weight (lbs) 182 lb 5.1 oz 188 lb 7.9 oz 188 lb 7.9 oz  Weight (kg) 82.7 kg 85.5 kg 85.5 kg      Telemetry    V paced with continued underlying atrial fibrillation- Personally  Reviewed  ECG    No new tracings- Personally Reviewed  Physical Exam   GEN: No acute distress.   Neck: No JVD Cardiac: RRR, no murmurs, rubs, or gallops.  Respiratory: Diminished with forced expiratory wheezing to auscultation bilaterally.  Respirations are unlabored at rest on 2 L via nasal cannula. GI: Soft, nontender, non-distended  MS: No edema; No deformity. Neuro:  Nonfocal  Psych: Normal affect   Labs    High Sensitivity Troponin:   Recent Labs  Lab 03/18/22 1958 03/22/22 0724 03/22/22 0854 03/22/22 1655 03/22/22 2235  TROPONINIHS 122* 197* 677* 1,543* 1,438*     Chemistry Recent Labs  Lab 03/19/22 0421 03/22/22 0724 03/23/22 0503 03/24/22 0616 03/25/22 0700  NA 137   < > 133* 135 131*  K 4.4   < > 5.3* 4.6 4.9  CL 96*   < > 93* 99 96*  CO2 26   < > 29 25 26   GLUCOSE 180*   < > 91 149* 352*  BUN 62*   < > 47* 56* 51*  CREATININE 1.24   < > 1.36* 1.51* 1.34*  CALCIUM 9.1   < > 8.7* 8.5* 9.3  MG 2.4  --   --  2.7*  --   GFRNONAA >60   < > 58* 52* 60*  ANIONGAP 15   < > 11 11 9    < > = values in this interval not displayed.    Lipids No results for input(s): "CHOL", "TRIG", "HDL", "LABVLDL", "LDLCALC", "CHOLHDL" in the last 168 hours.  Hematology Recent Labs  Lab 03/23/22 0503 03/24/22 0616 03/25/22 0700  WBC 15.1* 10.0 14.6*  RBC 4.69 4.52 4.53  HGB 13.6 12.9* 13.0  HCT 41.3 39.2 39.7  MCV 88.1 86.7 87.6  MCH 29.0 28.5 28.7  MCHC 32.9 32.9 32.7  RDW 17.9* 17.6* 17.5*  PLT 227 179 175   Thyroid No results for input(s): "TSH", "FREET4" in the last 168 hours.  BNP Recent Labs  Lab 03/18/22 1745 03/22/22 0725  BNP 500.5* 689.8*    DDimer No results for input(s): "DDIMER" in the last 168 hours.   Radiology    No results found.  Cardiac Studies  TTE 02/16/22 1. Left ventricular ejection fraction, by estimation, is 25 to 30%. The  left ventricle has severely decreased function. The left ventricle  demonstrates regional wall motion  abnormalities (see scoring  diagram/findings for description). The left  ventricular internal cavity size was moderately dilated. There is mild  concentric left ventricular hypertrophy. Left ventricular diastolic  parameters are indeterminate. Elevated left ventricular end-diastolic  pressure.   2. Right ventricular systolic function is normal. The right ventricular  size is normal.   3. Left atrial size was severely dilated.   4. Right atrial size was mildly dilated.   5. The mitral valve is normal in structure. Mild mitral valve  regurgitation. No evidence of mitral stenosis.   6. The aortic valve is tricuspid. Aortic valve regurgitation is mild. No  aortic stenosis is present.   7. The inferior vena cava is normal in size with <50% respiratory  variability, suggesting right atrial pressure of 8 mmHg.   Patient Profile     61 y.o. male with past medical history of CAD status post CABG in 2017, ischemic cardiomyopathy with known EF of 30-35%, status post ICD placement in 2017, COPD, paroxysmal atrial fibrillation on chronic apixaban, CVA, bipolar disorder, current tobacco use, who has been seen and evaluated for CHF exacerbation.  Assessment & Plan    Acute on chronic systolic congestive heart failure; ischemic cardiomyopathy with LVEF 35-30% status post ICD -was recently discharged on 9/14 for COPD and returned with shortness of breath and found to be COVID positive -BNP elevated to 686 -CXR revealed pulmonary vascular congestion -known LVEF 25-30% -PTA medication of carvedilol 3.125 mg bid and lisinopril 2.5 mg on hold due to soft blood pressures -will need to be restarted when able -currently on midodrine to help with Bp - Acute respiratory failure with hypoxia; COPD; COVID-19 infection -multiple admissions for CHF exacerbation and COPD -weaned off of Bipap -maintaining O2 sats on 2L O2 via [place] -continue remdesivir -supportive care -management per IM  Paroxysmal atrial  fibrillation on chronic anticoagulant with apixaban -apixaban on hold -continued on heparin drip for 48 hours, then back to PTA apixaban -chadsvasc score of at least 5 -rate has been better controlled -will need to restart bb therapy when bp allows -not a good candidate for amiodarone due to underlying pulmonary disease -V-paced on telemetry monitor  Acute kidney injury on  CKD stage III -serum creatinine 1.34 -baseline serum creatinine 1.10 -monitor urine output -monitor/trend/replete electrolytes as needed -daily bmp -avoid nephrotoxic agents where able  Elevated high-sensitivity troponin with a known history of CAD status post CABG in 2017 -chest pain free -maintain IV heparin for 48 hours -myoview in 2021 was high risk with a large defect consistent with prior MI -continue statin therapy -not a good candidate for invasive work-up due to respiratory status, COVID infection, and CKD     CHA2DS2-VASc Score = 5   This indicates a 7.2% annual risk of stroke. The patient's score is based upon: CHF History: 1 HTN History: 1 Diabetes History: 0 Stroke History: 2 Vascular Disease History: 1 Age Score: 0 Gender Score: 0        For questions or updates, please contact [place] Please consult [email or web address] for contact info under        Signed, Laresa Oshiro, NP  03/25/2022, 9:32 AM

## 2022-03-25 NOTE — Progress Notes (Signed)
Initial Nutrition Assessment  DOCUMENTATION CODES:   Not applicable  INTERVENTION:   -Increase Ensure Enlive po to TID, each supplement provides 350 kcal and 20 grams of protein -MVI with minerals daily -Liberalize diet to 2 gram sodium for wider variety of meal selection -RD provided "Low Sodium Nutrition Therapy" handout from [facility]'s Nutrition Care Manual; attached to AVS/ discharge instructions  NUTRITION DIAGNOSIS:   Increased nutrient needs related to chronic illness (CHF, COPD) as evidenced by estimated needs.  GOAL:   Patient will meet greater than or equal to 90% of their needs  MONITOR:   PO intake, Supplement acceptance  REASON FOR ASSESSMENT:   Rounds    ASSESSMENT:   Pt with medical history significant for bipolar disorder, chronic systolic CHF, COPD, depression, hypertension, coronary artery disease s/p NSTEMI, stroke and seizure disorder. Recently admitted for COPD exacerbation and decompensated HFrEF 03/14/22.  Pt admitted with elevated troponin and CHF.   Reviewed I/O's: -58 ml x 24 hours and -3.2 L since admission  UOP: 1.8 L x 24 hours   Pt unavailable at time of visit. Attempted to speak with pt via call to hospital room phone, however, unable to reach. RD unable to obtain further nutrition-related history or complete nutrition-focused physical exam at this time.    Pt tested positive for COVID-19 on 03/22/22. Per TOC notes, pt will remain in hospital until he can return to [place] on 04/02/22 (10 day quarantine period).   Pt currently on a heart healthy diet. No meal completion data available to assess at this time. Noted Ensure supplements have been ordered and pt is consuming them.   Reviewed wt hx; pt has experienced a 4.9% wt loss over the past month, which is not significant for time frame.   Medications reviewed and include depakote, keppra, solu-medrol, spiriva, and remdesivir.   Lab Results  Component Value Date   HGBA1C 5.6 11/03/2018    PTA DM medications are none.   Labs reviewed: CBGS: 251 (inpatient orders for glycemic control are none). Suspect hyperglycemia secondary to steroids.    Diet Order:   Diet Order             Diet Heart Room service appropriate? Yes; Fluid consistency: Thin  Diet effective now                   EDUCATION NEEDS:   No education needs have been identified at this time  Skin:  Skin Assessment: Reviewed RN Assessment  Last BM:  03/24/22  Height:   Ht Readings from Last 1 Encounters:  03/24/22 5\' 9"  (1.753 m)    Weight:   Wt Readings from Last 1 Encounters:  03/25/22 82.7 kg    Ideal Body Weight:  72.7 kg  BMI:  Body mass index is 26.92 kg/m.  Estimated Nutritional Needs:   Kcal:  2200-2400  Protein:  110-125 grams  Fluid:  > 2 L    Loistine Chance, RD, LDN, [place] Registered Dietitian II Certified Diabetes Care and Education Specialist Please refer to [facility] for RD and/or RD on-call/weekend/after hours pager

## 2022-03-25 NOTE — Progress Notes (Signed)
ANTICOAGULATION CONSULT NOTE  Pharmacy Consult for apixaban to heparin Indication: atrial fibrillation  Allergies  Allergen Reactions   Iodinated Contrast Media Shortness Of Breath   Penicillins Anaphylaxis and Shortness Of Breath    Respiratory  Tolerated cefuroxime on 01/06/16   Strawberry Extract Anaphylaxis   Cefepime Itching    Empiric antibiotic, developed pruritis.    Erythromycin Itching   Sulfa Antibiotics Itching, Nausea And Vomiting and Nausea Only    Patient Measurements: Height: 5\' 9"  (175.3 cm) Weight: 82.7 kg (182 lb 5.1 oz) IBW/kg (Calculated) : 70.7 Heparin Dosing Weight: 82.7  Vital Signs: Temp: 97.7 F (36.5 C) (09/18 1240) Temp Source: Oral (09/18 1240) BP: 108/73 (09/18 1240) Pulse Rate: 85 (09/18 1240)  Labs: Recent Labs     0000 03/22/22 1655 03/22/22 2235 03/23/22 0503 03/23/22 1447 03/24/22 0616 03/24/22 1453 03/24/22 2216 03/25/22 0700  HGB   < >  --   --  13.6  --  12.9*  --   --  13.0  HCT  --   --   --  41.3  --  39.2  --   --  39.7  PLT  --   --   --  227  --  179  --   --  175  APTT  --   --   --   --    < > 148* 44* 56* 145*  LABPROT  --   --   --  15.7*  --   --   --   --   --   INR  --   --   --  1.3*  --   --   --   --   --   HEPARINUNFRC  --   --   --   --   --  >1.10*  --   --  0.97*  CREATININE  --   --   --  1.36*  --  1.51*  --   --  1.34*  TROPONINIHS  --  1,543* 1,438*  --   --   --   --   --   --    < > = values in this interval not displayed.    Estimated Creatinine Clearance: 56.4 mL/min (A) (by C-G formula based on SCr of 1.34 mg/dL (H)).  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 Pt presenting with elevated troponins (197 >> 1,543 peak), as well as acute respiratory failure with hypoxia, exhibiting diffuse wheezing on exam. Pt on apixaban 5 mg twice daily PTA for paroxysmal Afib. Pt previously started on heparin yesterday at 1100 units/hr during this same admission but was only on it for 3 hrs before transitioning back to  apixaban. Last dose of apixaban at 0921 on 03/23/2022.  Cards has been consulted and advises switching back to heparin drip for 48 hrs.   Baseline Labs: aPTT - 38; INR - 1.3; Hgb - 13.6; Plts - 227  Goal of Therapy:  Heparin level 0.3-0.7 units/ml aPTT : 66-102s Monitor platelets by anticoagulation protocol: Yes  Results: Date/time aPTT/HL  Comments 9/17 @ 0616   aPTT = 148  HL = >1.10 (elevated due to apixaban)  9/17 @ 2216   aPTT = 56  Subtherapeutic 9/18@0700  aPTT = 145  HL remains elevated, off apixaban for <48hrs   Plan:   aPTT now therapeutic following most recent rate change Continue heparin infusion at 1000 un/hr Will recheck aPTT 6 hrs to confirm Plan to check aPTT, HL, and CBC with AM labs  Vallery Sa PharmD, BCPS 03/25/2022 2:39 PM

## 2022-03-25 NOTE — Progress Notes (Signed)
Inpatient Diabetes Program Recommendations  AACE/ADA: New Consensus Statement on Inpatient Glycemic Control (2015)  Target Ranges:  Prepandial:   less than 140 mg/dL      Peak postprandial:   less than 180 mg/dL (1-2 hours)      Critically ill patients:  140 - 180 mg/dL   Lab Results  Component Value Date   GLUCAP 251 (H) 03/21/2022   HGBA1C 5.6 11/03/2018    Review of Glycemic Control  Latest Reference Range & Units 03/24/22 06:16 03/25/22 07:00  Glucose 70 - 99 mg/dL 149 (H) 352 (H)   Diabetes history: None Current orders for Inpatient glycemic control:  Solumedrol 30 mg IV q 12 hours Inpatient Diabetes Program Recommendations:   Note elevated lab glucose.  Consider adding Novolog sensitive tid with meals and HS.   Thanks,  Adah Perl, RN, BC-ADM Inpatient Diabetes Coordinator Pager [phone]  (8a-5p)

## 2022-03-26 ENCOUNTER — Ambulatory Visit: Payer: Medicaid Other | Admitting: Cardiovascular Disease

## 2022-03-26 ENCOUNTER — Other Ambulatory Visit (HOSPITAL_COMMUNITY): Payer: Self-pay

## 2022-03-26 DIAGNOSIS — I480 Paroxysmal atrial fibrillation: Secondary | ICD-10-CM

## 2022-03-26 DIAGNOSIS — I248 Other forms of acute ischemic heart disease: Secondary | ICD-10-CM | POA: Diagnosis not present

## 2022-03-26 DIAGNOSIS — J9601 Acute respiratory failure with hypoxia: Secondary | ICD-10-CM | POA: Diagnosis not present

## 2022-03-26 DIAGNOSIS — J441 Chronic obstructive pulmonary disease with (acute) exacerbation: Secondary | ICD-10-CM | POA: Diagnosis not present

## 2022-03-26 DIAGNOSIS — I5023 Acute on chronic systolic (congestive) heart failure: Secondary | ICD-10-CM | POA: Diagnosis not present

## 2022-03-26 LAB — BASIC METABOLIC PANEL
Anion gap: 7 (ref 5–15)
BUN: 42 mg/dL — ABNORMAL HIGH (ref 8–23)
CO2: 28 mmol/L (ref 22–32)
Calcium: 8.8 mg/dL — ABNORMAL LOW (ref 8.9–10.3)
Chloride: 101 mmol/L (ref 98–111)
Creatinine, Ser: 1.09 mg/dL (ref 0.61–1.24)
GFR, Estimated: >60 mL/min (ref >60)
Glucose, Bld: 115 mg/dL — ABNORMAL HIGH (ref 70–99)
Potassium: 4.6 mmol/L (ref 3.5–5.1)
Sodium: 136 mmol/L (ref 135–145)

## 2022-03-26 LAB — CBC
HCT: 37.2 % — ABNORMAL LOW (ref 39.0–52.0)
Hemoglobin: 12.4 g/dL — ABNORMAL LOW (ref 13.0–17.0)
MCH: 28.8 pg (ref 26.0–34.0)
MCHC: 33.3 g/dL (ref 30.0–36.0)
MCV: 86.5 fL (ref 80.0–100.0)
Platelets: 172 10*3/uL (ref 150–400)
RBC: 4.3 MIL/uL (ref 4.22–5.81)
RDW: 17 % — ABNORMAL HIGH (ref 11.5–15.5)
WBC: 14.7 10*3/uL — ABNORMAL HIGH (ref 4.0–10.5)
nRBC: 0.7 % — ABNORMAL HIGH (ref 0.0–0.2)

## 2022-03-26 LAB — HEMOGLOBIN A1C
Hgb A1c MFr Bld: 6.6 % — ABNORMAL HIGH (ref 4.8–5.6)
Mean Plasma Glucose: 142.72 mg/dL

## 2022-03-26 LAB — HEPARIN LEVEL (UNFRACTIONATED): Heparin Unfractionated: >1.1 IU/mL — ABNORMAL HIGH (ref 0.30–0.70)

## 2022-03-26 LAB — GLUCOSE, CAPILLARY
Glucose-Capillary: 306 mg/dL — ABNORMAL HIGH (ref 70–99)
Glucose-Capillary: 270 mg/dL — ABNORMAL HIGH (ref 70–99)
Glucose-Capillary: 220 mg/dL — ABNORMAL HIGH (ref 70–99)

## 2022-03-26 LAB — APTT: aPTT: >200 seconds (ref 24–36)

## 2022-03-26 MED ORDER — APIXABAN 5 MG PO TABS
5.0000 mg | ORAL_TABLET | Freq: Two times a day (BID) | ORAL | Status: DC
  Administered 2022-03-26 – 2022-04-01 (×13): 5 mg via ORAL
  Filled 2022-03-26 (×13): qty 1

## 2022-03-26 MED ORDER — PREDNISONE 20 MG PO TABS
40.0000 mg | ORAL_TABLET | Freq: Every day | ORAL | Status: AC
  Administered 2022-03-27 – 2022-03-28 (×2): 40 mg via ORAL
  Filled 2022-03-26 (×2): qty 2

## 2022-03-26 MED ORDER — PREDNISONE 20 MG PO TABS
20.0000 mg | ORAL_TABLET | Freq: Every day | ORAL | Status: AC
  Administered 2022-03-31 – 2022-04-01 (×2): 20 mg via ORAL
  Filled 2022-03-26 (×2): qty 1

## 2022-03-26 MED ORDER — MIDODRINE HCL 5 MG PO TABS
5.0000 mg | ORAL_TABLET | Freq: Three times a day (TID) | ORAL | Status: DC
  Administered 2022-03-26 – 2022-03-27 (×4): 5 mg via ORAL
  Filled 2022-03-26 (×4): qty 1

## 2022-03-26 MED ORDER — PREDNISONE 10 MG PO TABS: 10.0000 mg | ORAL_TABLET | Freq: Every day | ORAL | Status: DC

## 2022-03-26 MED ORDER — PREDNISONE 20 MG PO TABS
30.0000 mg | ORAL_TABLET | Freq: Every day | ORAL | Status: AC
  Administered 2022-03-29 – 2022-03-30 (×2): 30 mg via ORAL
  Filled 2022-03-26 (×2): qty 1

## 2022-03-26 MED ORDER — ROSUVASTATIN CALCIUM 10 MG PO TABS
40.0000 mg | ORAL_TABLET | Freq: Every day | ORAL | Status: DC
  Administered 2022-03-27 – 2022-04-01 (×6): 40 mg via ORAL
  Filled 2022-03-26 (×6): qty 4

## 2022-03-26 NOTE — Progress Notes (Addendum)
[facility] Progress Note  Patient: Carl [name]    NUU:[id number]  DOA: 03/22/2022    Date of Service: the patient was seen and examined on 03/26/2022  Brief hospital course: 61 year old male with past medical history of bipolar disorder, chronic systolic heart failure with ejection fraction of 25%, CAD status post non-STEMI, CVA, seizure disorder and COPD with recent hospitalization for CHF and COPD exacerbations who presented to the emergency room on 9/15 with shortness of breath and chest pain and found to have hypoxia.  Patient was admitted for CHF exacerbation.  Also noted to have elevated troponins and cardiology consulted.  After patient admitted, COVID PCR came back positive.  Patient started on IV Remdisivir and IV steroids.  Over the next few days, patient's oxygenation has improved.  Initially was on BiPAP and has been able to be weaned off of oxygen to room air.  Patient has diuresed over 7 L of fluid and is over -3 L deficient and down 6 pounds.  Assessment and Plan: Assessment and Plan: * Elevated troponin Felt to more demand ischemia in the setting of respiratory issues, heart failure COVID.  Cardiology following.  Acute respiratory failure with hypoxia (HCC) Multifactorial felt to be secondary to COVID, CHF and COPD.  Able to be weaned off of oxygen altogether.  Acute on chronic systolic CHF (congestive heart failure) (HCC) Last echo with EF 23%. Patient presents with increased SOB with rales on exam and rising BNP.  Responding to IV Lasix and has diuresed over 7L and is more than -3 L deficient  COPD exacerbation (HCC) Continue nebulizers, tapering steroids  COVID-19 virus infection Given hypoxia, started on Remdisivir and Solu-Medrol.  CRP level trending down.  Remdisivir completed 9/18.  Solu-Medrol changed over to p.o. prednisone for quick taper.  Although patient has improved, he will need to stay here until Monday, 9/25, where he will complete a 10-day isolation  for COVID  Hypotension-resolved as of 03/26/2022 Had some issues with hypotension, possibly from diuresis.  Since resolved.  Acute renal failure superimposed on stage 3a chronic kidney disease (HCC)-resolved as of 03/26/2022 Baseline around 1.1.  Creatinine briefly rose, then improved, despite diuresis.  Resolved by 9/19  Bipolar 1 disorder [facility]) Patient with psych issues. Has also suffered multifactorial encephalopathy. Social Worker states that he is under guardianship and has been deemed incompetent to make medical decisions. Therefore, DSS needs to grant consent for any procedures contemplated. The social worker also stated that he is not competent to make a decision regarding Code status although the patient states he would want resuscitation.  We will plan to continue the patient's psychiatric medications.  Type 2 diabetes mellitus with hyperglycemia (HCC) Worsening blood sugars due to being on Solu-Medrol plus underlying illness.  No recent A1c on chart so we will check.  Appreciate diabetes educator help.  Have added sliding scale.  CBGs should improve as we are continue to trend his steroids down.  Patient not on any home medications.  Paroxysmal atrial fibrillation (HCC) Patient in fib/flutter on EKG. Eliquis restarted 9/19 after 48 hours of IV heparin.  Seizure ([place]) Continue home meds. Seizure precautions  Coronary artery disease Patient with known CAD. S/P CABG. He reports that since being home from hospital 03/21/22 he has had increased chest pain - a heavy pressure in his chest. Troponins: 197 to 677. No acute changes on EKG but LBBB and pacer limit interpretation.  As per cardiology, patient has been on IV heparin for 48 hours.  HLD (hyperlipidemia)  Continue statin therapy  Hypothyroidism Continue Synthroid  Overweight (BMI 25.0-29.9) Meets criteria BMI greater than 25       Body mass index is 26.92 kg/m.  Nutrition Problem: Increased nutrient needs Etiology:  chronic illness (CHF, COPD)     Consultants: Cardiology  Procedures: None  Antimicrobials: IV Remdisivir 9/16-9/18  Code Status: Full code   Subjective: Patient frustrated as he feels better, but is not able to be discharged.  Objective: Few episodes of hypotension Vitals:   03/26/22 0955 03/26/22 1253  BP: 104/72 (!) 104/54  Pulse: 96 86  Resp: 20 (!) 24  Temp:  98 F (36.7 C)  SpO2: 98% 97%    Intake/Output Summary (Last 24 hours) at 03/26/2022 1447 Last data filed at 03/26/2022 0400 Gross per 24 hour  Intake 974.69 ml  Output 1000 ml  Net -25.31 ml    Filed Weights   03/22/22 1100 03/25/22 0500  Weight: 85.5 kg 82.7 kg   Body mass index is 26.92 kg/m.  Exam:  General: Oriented x2, no acute distress HEENT: Normocephalic, atraumatic, mucous membranes slightly dry Cardiovascular: Regular rate and rhythm, S1-S2, 2 out of 6 systolic ejection murmur Respiratory: Decreased breath sounds throughout, no wheezing Abdomen: Soft, nontender, nondistended, positive bowel sounds Musculoskeletal: No clubbing or cyanosis, trace pitting edema Skin: No skin breaks, tears or lesions Psychiatry: Patient has underlying bipolar disorder and is not felt to be competent to make his own decisions. Neurology: No focal deficits  Data Reviewed: Creatinine normalized  Disposition:  Status is: Inpatient Remains inpatient appropriate because:  -Completion of 10-day isolation for COVID    Anticipated discharge date: 9/25  Family Communication: Patient is a ward of the state.  Updated her on 9/19 and we discussed his previous CODE STATUS (patient made DNR at previous hospitalization and they are deciding whether to rescind that or continue) DVT Prophylaxis: Completed 48 hours of IV heparin apixaban (ELIQUIS) tablet 5 mg    Author: Annita Brod ,MD 03/26/2022 2:47 PM  To reach On-call, see care teams to locate the attending and reach out via [email or web address]. Between  7PM-7AM, please contact night-coverage If you still have difficulty reaching the attending provider, please page the [facility] (Director on Call) for [facility] on amion for assistance.

## 2022-03-26 NOTE — Progress Notes (Signed)
Rounding Note    Patient Name: Carl [name] Date of Encounter: 03/26/2022  [facility] Cardiologist: Carl Bears, MD   Subjective   Feeling better today with minimal shortness of breath.  No chest pain reported.  Wants to go home in order to take care of some financial issues.  Inpatient Medications    Scheduled Meds:  apixaban  5 mg Oral BID   atorvastatin  40 mg Oral Daily   divalproex  500 mg Oral BID   escitalopram  10 mg Oral QHS   feeding supplement  237 mL Oral TID BM   hydrOXYzine  10 mg Oral BID   insulin aspart  0-15 Units Subcutaneous TID WC   insulin aspart  0-5 Units Subcutaneous QHS   levETIRAcetam  250 mg Oral BID   levothyroxine  25 mcg Oral Daily   midodrine  10 mg Oral TID WC   multivitamin with minerals  1 tablet Oral Daily   mouth rinse  15 mL Mouth Rinse 4 times per day   pantoprazole  80 mg Oral Daily   predniSONE  50 mg Oral Q breakfast   QUEtiapine  200 mg Oral QHS   tiotropium  18 mcg Inhalation Daily   Continuous Infusions:  PRN Meds: acetaminophen, albuterol, mouth rinse, traMADol   Vital Signs    Vitals:   03/25/22 2001 03/25/22 2351 03/26/22 0510 03/26/22 0955  BP: 118/77 111/75 110/80 104/72  Pulse: 77 82 79 96  Resp: 19 19 17 20   Temp: 97.7 F (36.5 C) 97.8 F (36.6 C) 97.8 F (36.6 C)   TempSrc: Oral Oral Oral   SpO2: 95% 96% 95% 98%  Weight:      Height:        Intake/Output Summary (Last 24 hours) at 03/26/2022 1120 Last data filed at 03/26/2022 0400 Gross per 24 hour  Intake 974.69 ml  Output 1000 ml  Net -25.31 ml      03/25/2022    5:00 AM 03/22/2022   11:00 AM 03/15/2022   12:41 AM  Last 3 Weights  Weight (lbs) 182 lb 5.1 oz 188 lb 7.9 oz 188 lb 7.9 oz  Weight (kg) 82.7 kg 85.5 kg 85.5 kg      Telemetry    Predominantly ventricularly paced with sporadic intrinsic conduction versus PVCs and brief NSVT - Personally Reviewed  ECG    No new tracing.  Physical Exam   GEN: No acute distress.    Neck: No JVD Cardiac: RRR, no murmurs, rubs, or gallops.  Respiratory: Coarse breath sounds bilaterally with scattered wheezes and rhonchi.  No crackles noted. GI: Soft, nontender, non-distended  MS: No edema; No deformity. Neuro:  Nonfocal  Psych: Normal affect   Labs    High Sensitivity Troponin:   Recent Labs  Lab 03/18/22 1958 03/22/22 0724 03/22/22 0854 03/22/22 1655 03/22/22 2235  TROPONINIHS 122* 197* 677* 1,543* 1,438*     Chemistry Recent Labs  Lab 03/24/22 0616 03/25/22 0700 03/26/22 0648  NA 135 131* 136  K 4.6 4.9 4.6  CL 99 96* 101  CO2 25 26 28   GLUCOSE 149* 352* 115*  BUN 56* 51* 42*  CREATININE 1.51* 1.34* 1.09  CALCIUM 8.5* 9.3 8.8*  MG 2.7*  --   --   GFRNONAA 52* 60* >60  ANIONGAP 11 9 7     Lipids No results for input(s): "CHOL", "TRIG", "HDL", "LABVLDL", "LDLCALC", "CHOLHDL" in the last 168 hours.  Hematology Recent Labs  Lab 03/24/22 [phone] 03/25/22  0700 03/26/22 0648  WBC 10.0 14.6* 14.7*  RBC 4.52 4.53 4.30  HGB 12.9* 13.0 12.4*  HCT 39.2 39.7 37.2*  MCV 86.7 87.6 86.5  MCH 28.5 28.7 28.8  MCHC 32.9 32.7 33.3  RDW 17.6* 17.5* 17.0*  PLT 179 175 172   Thyroid No results for input(s): "TSH", "FREET4" in the last 168 hours.  BNP Recent Labs  Lab 03/22/22 0725  BNP 689.8*    DDimer No results for input(s): "DDIMER" in the last 168 hours.   Radiology    No results found.  Cardiac Studies   TTE (02/16/2022):  1. Left ventricular ejection fraction, by estimation, is 25 to 30%. The  left ventricle has severely decreased function. The left ventricle  demonstrates regional wall motion abnormalities (see scoring  diagram/findings for description). The left  ventricular internal cavity size was moderately dilated. There is mild  concentric left ventricular hypertrophy. Left ventricular diastolic  parameters are indeterminate. Elevated left ventricular Carl [name]  pressure.   2. Right ventricular systolic function is normal.  The right ventricular  size is normal.   3. Left atrial size was severely dilated.   4. Right atrial size was mildly dilated.   5. The mitral valve is normal in structure. Mild mitral valve  regurgitation. No evidence of mitral stenosis.   6. The aortic valve is tricuspid. Aortic valve regurgitation is mild. No  aortic stenosis is present.   7. The inferior vena cava is normal in size with <50% respiratory  variability, suggesting right atrial pressure of 8 mmHg.   Patient Profile     61 y.o. male with past medical history of CAD status post CABG in 2017, ischemic cardiomyopathy with known EF of 25-30%, status post ICD placement in 2017, COPD, paroxysmal atrial fibrillation on chronic apixaban, CVA, bipolar disorder, current tobacco use, admitted with acute respiratory failure with hypoxia in the setting of COVID-19 infection and acute on chronic HFrEF.  Assessment & Plan    Acute respiratory failure with hypoxia, COPD, and COVID-19 infection: Respiratory status has improved, with Carl [name] now weaned off BiPAP and on low-flow nasal cannula.  He feels like his breathing is near his baseline. -Ongoing therapy for COVID-19 and COPD per primary team.  Acute on chronic HFrEF and demand ischemia: LVEF known to be severely reduced secondary to ischemic cardiomyopathy.  No angina reported.  High-sensitivity troponin I peaked 4 days ago at 1543, most likely due to demand ischemia in the setting of acute respiratory failure, HFrEF, and COVID-19 infection.  Carl [name] appears euvolemic on examination today.  Goal-directed medical therapy currently on hold due to hypotension.  Patient on midodrine for blood pressure support. -Maintain net even fluid balance. -Wean midodrine, as tolerated. -Consider restarting low-dose carvedilol and lisinopril in the coming days or after discharge, as blood pressure allows.  Patient previously intolerant of Entresto. -Consider SGLT2 inhibitor and/or aldosterone  antagonist as an outpatient if blood pressure and renal function allow. -Continue atorvastatin for secondary prevention of CAD.  Defer aspirin in the setting of long-term anticoagulation with apixaban for PAF. -No plans for ischemia evaluation during this admission.  Paroxysmal atrial fibrillation: Rhythm is predominantly ventricularly paced.  Difficult to determine if there is underlying atrial fibrillation. -Continue apixaban 5 mg twice daily. -Restart low-dose carvedilol when blood pressure and respiratory status allow.  For questions or updates, please contact [place] Please consult [email or web address] for contact info under [facility].     Signed, Nelva Bush, MD  03/26/2022, 11:20  AM

## 2022-03-26 NOTE — Progress Notes (Signed)
[place] for heparin to apixaban Indication: atrial fibrillation  Allergies  Allergen Reactions   Iodinated Contrast Media Shortness Of Breath   Penicillins Anaphylaxis and Shortness Of Breath    Respiratory  Tolerated cefuroxime on 01/06/16   Strawberry Extract Anaphylaxis   Cefepime Itching    Empiric antibiotic, developed pruritis.    Erythromycin Itching   Sulfa Antibiotics Itching, Nausea And Vomiting and Nausea Only    Patient Measurements: Height: 5\' 9"  (175.3 cm) Weight: 82.7 kg (182 lb 5.1 oz) IBW/kg (Calculated) : 70.7 Heparin Dosing Weight: 82.7  Vital Signs: Temp: 97.8 F (36.6 C) (09/19 0510) Temp Source: Oral (09/19 0510) BP: 110/80 (09/19 0510) Pulse Rate: 79 (09/19 0510)  Labs: Recent Labs    03/24/22 0616 03/24/22 1453 03/25/22 0700 03/25/22 1619 03/25/22 2221 03/26/22 0648  HGB 12.9*  --  13.0  --   --  12.4*  HCT 39.2  --  39.7  --   --  37.2*  PLT 179  --  175  --   --  172  APTT 148*   < > 145* 80* 85* >200*  HEPARINUNFRC >1.10*  --  0.97*  --   --  >1.10*  CREATININE 1.51*  --  1.34*  --   --  1.09   < > = values in this interval not displayed.    Estimated Creatinine Clearance: 69.4 mL/min (by C-G formula based on SCr of 1.09 mg/dL).  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 Pt presented with elevated troponins (197 >> 1,543 peak), as well as acute respiratory failure with hypoxia, exhibiting diffuse wheezing on exam. Pt on apixaban 5 mg twice daily PTA for paroxysmal Afib. Pt previously started on heparin on 9/15 during this same admission but was only on it for 3 hrs before switching back to apixaban. Pt was then subsequently switched back to heparin for 48 hrs per cards recommendation for elevated troponins ISO CAD on 9/16. 48 hr heparin drip completed yesterday, 9/18, and cards cleared patient for transitioning back to apixaban. Hgb, Hct, and PLT stable.  Baseline Labs: aPTT - 38; INR - 1.3; Hgb - 13.6; Plts - 227  Plan:  Discontinue heparin drip Restart apixaban 5 mg PO twice daily for Afib starting this morning, 9/19 @ 1000 Monitor CBC weekly while on DOAC.  Dara Hoyer, PharmD PGY-1 Pharmacy Resident 03/26/2022 9:44 AM

## 2022-03-27 DIAGNOSIS — R778 Other specified abnormalities of plasma proteins: Secondary | ICD-10-CM | POA: Diagnosis not present

## 2022-03-27 DIAGNOSIS — I2489 Other forms of acute ischemic heart disease: Secondary | ICD-10-CM | POA: Diagnosis present

## 2022-03-27 DIAGNOSIS — I5023 Acute on chronic systolic (congestive) heart failure: Secondary | ICD-10-CM | POA: Diagnosis not present

## 2022-03-27 DIAGNOSIS — I248 Other forms of acute ischemic heart disease: Secondary | ICD-10-CM

## 2022-03-27 DIAGNOSIS — J441 Chronic obstructive pulmonary disease with (acute) exacerbation: Secondary | ICD-10-CM | POA: Diagnosis not present

## 2022-03-27 DIAGNOSIS — U071 COVID-19: Secondary | ICD-10-CM | POA: Diagnosis not present

## 2022-03-27 LAB — GLUCOSE, CAPILLARY
Glucose-Capillary: 200 mg/dL — ABNORMAL HIGH (ref 70–99)
Glucose-Capillary: 172 mg/dL — ABNORMAL HIGH (ref 70–99)
Glucose-Capillary: 273 mg/dL — ABNORMAL HIGH (ref 70–99)
Glucose-Capillary: 409 mg/dL — ABNORMAL HIGH (ref 70–99)

## 2022-03-27 MED ORDER — FUROSEMIDE 40 MG PO TABS
40.0000 mg | ORAL_TABLET | Freq: Every day | ORAL | Status: DC
  Administered 2022-03-27 – 2022-04-01 (×6): 40 mg via ORAL
  Filled 2022-03-27 (×6): qty 1

## 2022-03-27 MED ORDER — INSULIN GLARGINE-YFGN 100 UNIT/ML ~~LOC~~ SOLN
8.0000 [IU] | Freq: Every day | SUBCUTANEOUS | Status: DC
  Administered 2022-03-27 – 2022-03-31 (×5): 8 [IU] via SUBCUTANEOUS
  Filled 2022-03-27 (×6): qty 0.08

## 2022-03-27 MED ORDER — INSULIN ASPART 100 UNIT/ML IJ SOLN
6.0000 [IU] | Freq: Once | INTRAMUSCULAR | Status: AC
  Administered 2022-03-27: 6 [IU] via SUBCUTANEOUS
  Filled 2022-03-27: qty 1

## 2022-03-27 NOTE — Progress Notes (Signed)
Rounding Note    Patient Name: Carl [name] Date of Encounter: 03/27/2022  [place] Cardiologist: Kathlyn Sacramento, MD   Subjective   No chest pain or shortness of breath.  Inpatient Medications    Scheduled Meds:  apixaban  5 mg Oral BID   divalproex  500 mg Oral BID   escitalopram  10 mg Oral QHS   feeding supplement  237 mL Oral TID BM   hydrOXYzine  10 mg Oral BID   insulin aspart  0-15 Units Subcutaneous TID WC   insulin aspart  0-5 Units Subcutaneous QHS   insulin glargine-yfgn  8 Units Subcutaneous QHS   levETIRAcetam  250 mg Oral BID   levothyroxine  25 mcg Oral Daily   midodrine  5 mg Oral TID WC   multivitamin with minerals  1 tablet Oral Daily   mouth rinse  15 mL Mouth Rinse 4 times per day   pantoprazole  80 mg Oral Daily   predniSONE  40 mg Oral Q breakfast   Followed by   Derrill Memo ON 03/29/2022] predniSONE  30 mg Oral Q breakfast   Followed by   Derrill Memo ON 03/31/2022] predniSONE  20 mg Oral Q breakfast   Followed by   Derrill Memo ON 04/02/2022] predniSONE  10 mg Oral Q breakfast   QUEtiapine  200 mg Oral QHS   rosuvastatin  40 mg Oral Daily   tiotropium  18 mcg Inhalation Daily   Continuous Infusions:  PRN Meds: acetaminophen, albuterol, mouth rinse, traMADol   Vital Signs    Vitals:   03/27/22 0640 03/27/22 0829 03/27/22 1008 03/27/22 1225  BP: 92/75 (!) 95/53  (!) 124/53  Pulse: 82 81  80  Resp: 18 18  16   Temp: 98.6 F (37 C) 98 F (36.7 C)  97.8 F (36.6 C)  TempSrc:      SpO2: 97% 99% 99% 97%  Weight:      Height:        Intake/Output Summary (Last 24 hours) at 03/27/2022 1651 Last data filed at 03/27/2022 1100 Gross per 24 hour  Intake 240 ml  Output 1250 ml  Net -1010 ml      03/25/2022    5:00 AM 03/22/2022   11:00 AM 03/15/2022   12:41 AM  Last 3 Weights  Weight (lbs) 182 lb 5.1 oz 188 lb 7.9 oz 188 lb 7.9 oz  Weight (kg) 82.7 kg 85.5 kg 85.5 kg      Telemetry    Atrial fibrillation with ventricular pacing and  occasional intrinsic beats versus PVCs.- Personally Reviewed  ECG    No new tracing.  Physical Exam   GEN: No acute distress.   Neck: P approximately 8 cm with positive HJR. Cardiac: RRR, no murmurs, rubs, or gallops.  Respiratory: Diminished breath sounds bilaterally with scattered rhonchi.  Overall, air movement is better since yesterday. GI: Soft, nontender, non-distended  MS: No edema; No deformity. Neuro:  Nonfocal  Psych: Normal affect   Labs    High Sensitivity Troponin:   Recent Labs  Lab 03/18/22 1958 03/22/22 0724 03/22/22 0854 03/22/22 1655 03/22/22 2235  TROPONINIHS 122* 197* 677* 1,543* 1,438*     Chemistry Recent Labs  Lab 03/24/22 0616 03/25/22 0700 03/26/22 0648  NA 135 131* 136  K 4.6 4.9 4.6  CL 99 96* 101  CO2 25 26 28   GLUCOSE 149* 352* 115*  BUN 56* 51* 42*  CREATININE 1.51* 1.34* 1.09  CALCIUM 8.5* 9.3 8.8*  MG 2.7*  --   --  GFRNONAA 52* 60* >60  ANIONGAP 11 9 7     Lipids No results for input(s): "CHOL", "TRIG", "HDL", "LABVLDL", "LDLCALC", "CHOLHDL" in the last 168 hours.  Hematology Recent Labs  Lab 03/24/22 0616 03/25/22 0700 03/26/22 0648  WBC 10.0 14.6* 14.7*  RBC 4.52 4.53 4.30  HGB 12.9* 13.0 12.4*  HCT 39.2 39.7 37.2*  MCV 86.7 87.6 86.5  MCH 28.5 28.7 28.8  MCHC 32.9 32.7 33.3  RDW 17.6* 17.5* 17.0*  PLT 179 175 172   Thyroid No results for input(s): "TSH", "FREET4" in the last 168 hours.  BNP Recent Labs  Lab 03/22/22 0725  BNP 689.8*    DDimer No results for input(s): "DDIMER" in the last 168 hours.   Radiology    No results found.  Cardiac Studies   TTE (02/16/2022):  1. Left ventricular ejection fraction, by estimation, is 25 to 30%. The  left ventricle has severely decreased function. The left ventricle  demonstrates regional wall motion abnormalities (see scoring  diagram/findings for description). The left  ventricular internal cavity size was moderately dilated. There is mild  concentric left  ventricular hypertrophy. Left ventricular diastolic  parameters are indeterminate. Elevated left ventricular [place]-diastolic  pressure.   2. Right ventricular systolic function is normal. The right ventricular  size is normal.   3. Left atrial size was severely dilated.   4. Right atrial size was mildly dilated.   5. The mitral valve is normal in structure. Mild mitral valve  regurgitation. No evidence of mitral stenosis.   6. The aortic valve is tricuspid. Aortic valve regurgitation is mild. No  aortic stenosis is present.   7. The inferior vena cava is normal in size with <50% respiratory  variability, suggesting right atrial pressure of 8 mmHg.   Patient Profile     61 y.o. male with past medical history of CAD status post CABG in 2017, ischemic cardiomyopathy with known EF of 25-30%, status post ICD placement in 2017, COPD, paroxysmal atrial fibrillation on chronic apixaban, CVA, bipolar disorder, current tobacco use, admitted with acute respiratory failure with hypoxia in the setting of COVID-19 infection and acute on chronic HFrEF.  Assessment & Plan    Acute respiratory failure with hypoxia, COPD, and COVID-19 infection: Mr. Swager continues to improve and reports that he feels close to normal in regard to his breathing.  Scattered rhonchi and mildly diminished breath sounds still noted on exam today. -Ongoing management of COPD and COVID-19 per primary team.  Acute on chronic HFrEF and demand ischemia: Suspect Mr. Kathan might be slightly volume up given JVP and diminished breath sounds.  He denies angina.  Blood pressure holding stable with weaning of midodrine yesterday. -Discontinue midodrine.  If blood pressure holds stable, consider restarting carvedilol or losartan tomorrow. -Start furosemide 40 mg p.o. daily. -Consider addition of aldosterone antagonist and/or SGLT2 inhibitor prior to discharge or at outpatient follow-up based on blood pressure. -No plans for ischemia  evaluation this admission.  Paroxysmal atrial fibrillation: Telemetry today suggests ventricular pacing with underlying atrial fibrillation and occasional intrinsic conduction. -Continue apixaban 5 mg twice daily. -Restart low-dose carvedilol when blood pressure and respiratory status allow.  For questions or updates, please contact [place] Please consult [email or web address] for contact info under [facility].     Signed, Nelva Bush, MD  03/27/2022, 4:51 PM

## 2022-03-27 NOTE — Progress Notes (Signed)
[facility] Progress Note  Patient: Carl [name]    HKV:[id number]  DOA: 03/22/2022    Date of Service: the patient was seen and examined on 03/27/2022  Brief hospital course: 61 year old male with past medical history of bipolar disorder, chronic systolic heart failure with ejection fraction of 25%, CAD status post non-STEMI, CVA, seizure disorder and COPD with recent hospitalization for CHF and COPD exacerbations who presented to the emergency room on 9/15 with shortness of breath and chest pain and found to have hypoxia.  Patient was admitted for CHF exacerbation.  Also noted to have elevated troponins and cardiology consulted.  After patient admitted, COVID PCR came back positive.  Patient started on IV Remdisivir and IV steroids.  Over the next few days, patient's oxygenation has improved.  Initially was on BiPAP and has been able to be weaned off of oxygen to room air.  Net IO Since Admission: -4,107.29 mL [03/27/22 1330].  At this point, patient has improved but will need to stay here until Monday, 04/01/2022, after completion of 10-day isolation for COVID for returning to his facility.   Assessment and Plan:  * Elevated troponin Felt to more demand ischemia in the setting of respiratory issues, heart failure COVID.  Cardiology following.  Acute respiratory failure with hypoxia (HCC) Multifactorial felt to be secondary to COVID, CHF and COPD.  Able to be weaned off of oxygen altogether.  Acute on chronic systolic CHF (congestive heart failure) (HCC) Last echo with EF 23%. Patient presents with increased SOB with rales on exam and rising BNP.  Responding to IV Lasix and has diuresed over 7L and is more than -3 L deficient  COPD exacerbation (HCC) Continue nebulizers, tapering steroids  COVID-19 virus infection Given hypoxia, started on Remdisivir and Solu-Medrol.  CRP level trending down.  Remdisivir completed 9/18.  Solu-Medrol changed over to p.o. prednisone for quick taper.   Although patient has improved, he will need to stay here until Monday, 9/25, where he will complete a 10-day isolation for COVID  Hypotension-resolved as of 03/26/2022 Had some issues with hypotension, possibly from diuresis.  Since resolved.  Acute renal failure superimposed on stage 3a chronic kidney disease (HCC)-resolved as of 03/26/2022 Baseline around 1.1.  Creatinine briefly rose, then improved, despite diuresis.  Resolved by 9/19  Bipolar 1 disorder [facility]) Patient with psych issues. Has also suffered multifactorial encephalopathy. Social Worker states that he is under guardianship and has been deemed incompetent to make medical decisions. Therefore, DSS needs to grant consent for any procedures contemplated. The social worker also stated that he is not competent to make a decision regarding Code status although the patient states he would want resuscitation.  We will plan to continue the patient's psychiatric medications.  Type 2 diabetes mellitus with hyperglycemia (HCC) Worsening blood sugars due to being on Solu-Medrol plus underlying illness.  No recent A1c on chart so we will check.  Appreciate diabetes educator help.  Have added sliding scale.  CBGs should improve as we are continue to trend his steroids down.  Patient not on any home medications.  Paroxysmal atrial fibrillation (HCC) Patient in fib/flutter on EKG. Eliquis restarted 9/19 after 48 hours of IV heparin.  Seizure ([place]) Continue home meds. Seizure precautions  Coronary artery disease Patient with known CAD. S/P CABG. He reports that since being home from hospital 03/21/22 he has had increased chest pain - a heavy pressure in his chest. Troponins: 197 to 677. No acute changes on EKG but LBBB and pacer limit  interpretation.  As per cardiology, patient has been on IV heparin for 48 hours.  HLD (hyperlipidemia) Continue statin therapy  Hypothyroidism Continue Synthroid  Overweight (BMI 25.0-29.9) Meets criteria BMI  greater than 25       Body mass index is 26.92 kg/m.  Nutrition Problem: Increased nutrient needs Etiology: chronic illness (CHF, COPD)     Consultants: Cardiology  Procedures: None  Antimicrobials: IV Remdisivir 9/16-9/18  Code Status: Full code   Subjective: Patient has no complaints this morning, no chest pain/shortness of breath, no headache/vision change/dizziness.  Objective: Vitals:   03/27/22 1008 03/27/22 1225  BP:  (!) 124/53  Pulse:  80  Resp:  16  Temp:  97.8 F (36.6 C)  SpO2: 99% 97%    Intake/Output Summary (Last 24 hours) at 03/27/2022 1330 Last data filed at 03/27/2022 1100 Gross per 24 hour  Intake 240 ml  Output 1250 ml  Net -1010 ml   Filed Weights   03/22/22 1100 03/25/22 0500  Weight: 85.5 kg 82.7 kg   Body mass index is 26.92 kg/m.  Exam: General: Oriented x2, no acute distress HEENT: Normocephalic, atraumatic, mucous membranes slightly dry Cardiovascular: Regular rate and rhythm, S1-S2, 2 out of 6 systolic ejection murmur Respiratory: Decreased breath sounds throughout, no wheezing Abdomen: Soft, nontender, nondistended, positive bowel sounds Musculoskeletal: No clubbing or cyanosis, trace pitting edema Skin: No skin breaks, tears or lesions Psychiatry: Patient has underlying bipolar disorder and is not felt to be competent to make his own decisions.  At this point, he is calm, normal thought content Neurology: No focal deficits  Data Reviewed: Creatinine normalized  Disposition:  Status is: Inpatient Remains inpatient appropriate because:  -Completion of 10-day isolation for COVID    Anticipated discharge date: 9/25  Family Communication: Patient is a ward of the state.  Updated her on 9/19 and we discussed his previous CODE STATUS (patient made DNR at previous hospitalization and they are deciding whether to rescind that or continue) DVT Prophylaxis: Completed 48 hours of IV heparin, now on apixaban Arne [place]) tablet 5  mg    Author: Emeterio Reeve ,DO 03/27/2022 1:30 PM  To reach On-call, see care teams to locate the attending and reach out via [email or web address]. Between 7PM-7AM, please contact night-coverage If you still have difficulty reaching the attending provider, please page the [facility] (Director on Call) for [facility] on amion for assistance.

## 2022-03-28 ENCOUNTER — Other Ambulatory Visit (HOSPITAL_COMMUNITY): Payer: Self-pay

## 2022-03-28 ENCOUNTER — Telehealth (HOSPITAL_COMMUNITY): Payer: Self-pay | Admitting: Pharmacy Technician

## 2022-03-28 DIAGNOSIS — J441 Chronic obstructive pulmonary disease with (acute) exacerbation: Secondary | ICD-10-CM | POA: Diagnosis not present

## 2022-03-28 DIAGNOSIS — J9601 Acute respiratory failure with hypoxia: Secondary | ICD-10-CM | POA: Diagnosis not present

## 2022-03-28 DIAGNOSIS — I248 Other forms of acute ischemic heart disease: Secondary | ICD-10-CM | POA: Diagnosis not present

## 2022-03-28 DIAGNOSIS — I5023 Acute on chronic systolic (congestive) heart failure: Secondary | ICD-10-CM | POA: Diagnosis not present

## 2022-03-28 DIAGNOSIS — I480 Paroxysmal atrial fibrillation: Secondary | ICD-10-CM | POA: Diagnosis not present

## 2022-03-28 DIAGNOSIS — J9620 Acute and chronic respiratory failure, unspecified whether with hypoxia or hypercapnia: Secondary | ICD-10-CM | POA: Diagnosis not present

## 2022-03-28 LAB — LIPID PANEL
Cholesterol: 132 mg/dL (ref 0–200)
HDL: 44 mg/dL (ref >40)
LDL Cholesterol: 67 mg/dL (ref 0–99)
Total CHOL/HDL Ratio: 3 RATIO
Triglycerides: 107 mg/dL (ref <150)
VLDL: 21 mg/dL (ref 0–40)

## 2022-03-28 LAB — CULTURE, BLOOD (ROUTINE X 2)
Culture: NO GROWTH
Special Requests: ADEQUATE

## 2022-03-28 LAB — BASIC METABOLIC PANEL
Anion gap: 7 (ref 5–15)
BUN: 39 mg/dL — ABNORMAL HIGH (ref 8–23)
CO2: 28 mmol/L (ref 22–32)
Calcium: 9.5 mg/dL (ref 8.9–10.3)
Chloride: 98 mmol/L (ref 98–111)
Creatinine, Ser: 1.1 mg/dL (ref 0.61–1.24)
GFR, Estimated: >60 mL/min (ref >60)
Glucose, Bld: 124 mg/dL — ABNORMAL HIGH (ref 70–99)
Potassium: 4.8 mmol/L (ref 3.5–5.1)
Sodium: 133 mmol/L — ABNORMAL LOW (ref 135–145)

## 2022-03-28 LAB — CBC
HCT: 35.9 % — ABNORMAL LOW (ref 39.0–52.0)
Hemoglobin: 12.1 g/dL — ABNORMAL LOW (ref 13.0–17.0)
MCH: 28.7 pg (ref 26.0–34.0)
MCHC: 33.7 g/dL (ref 30.0–36.0)
MCV: 85.3 fL (ref 80.0–100.0)
Platelets: 149 10*3/uL — ABNORMAL LOW (ref 150–400)
RBC: 4.21 MIL/uL — ABNORMAL LOW (ref 4.22–5.81)
RDW: 17.1 % — ABNORMAL HIGH (ref 11.5–15.5)
WBC: 16 10*3/uL — ABNORMAL HIGH (ref 4.0–10.5)
nRBC: 0.9 % — ABNORMAL HIGH (ref 0.0–0.2)

## 2022-03-28 LAB — GLUCOSE, CAPILLARY
Glucose-Capillary: 105 mg/dL — ABNORMAL HIGH (ref 70–99)
Glucose-Capillary: 304 mg/dL — ABNORMAL HIGH (ref 70–99)
Glucose-Capillary: 346 mg/dL — ABNORMAL HIGH (ref 70–99)
Glucose-Capillary: 256 mg/dL — ABNORMAL HIGH (ref 70–99)

## 2022-03-28 MED ORDER — EZETIMIBE 10 MG PO TABS
10.0000 mg | ORAL_TABLET | Freq: Every day | ORAL | Status: DC
  Administered 2022-03-29 – 2022-04-01 (×4): 10 mg via ORAL
  Filled 2022-03-28 (×4): qty 1

## 2022-03-28 MED ORDER — CARVEDILOL 3.125 MG PO TABS
3.1250 mg | ORAL_TABLET | Freq: Two times a day (BID) | ORAL | Status: DC
  Administered 2022-03-28 – 2022-04-01 (×8): 3.125 mg via ORAL
  Filled 2022-03-28 (×8): qty 1

## 2022-03-28 MED ORDER — GLUCERNA SHAKE PO LIQD
237.0000 mL | Freq: Three times a day (TID) | ORAL | Status: DC
  Administered 2022-03-28 – 2022-04-01 (×10): 237 mL via ORAL

## 2022-03-28 NOTE — Progress Notes (Signed)
Nutrition Follow-up  DOCUMENTATION CODES:   Not applicable  INTERVENTION:   -D/c Ensure Enlive po BID, each supplement provides 350 kcal and 20 grams of protein -Glucerna Shake po TID, each supplement provides 220 kcal and 10 grams of protein  -Continue MVI with minerals daily  NUTRITION DIAGNOSIS:   Increased nutrient needs related to chronic illness (CHF, COPD) as evidenced by estimated needs.  Ongoing  GOAL:   Patient will meet greater than or equal to 90% of their needs  Progressing   MONITOR:   PO intake, Supplement acceptance  REASON FOR ASSESSMENT:   Rounds    ASSESSMENT:   Pt with medical history significant for bipolar disorder, chronic systolic CHF, COPD, depression, hypertension, coronary artery disease s/p NSTEMI, stroke and seizure disorder. Recently admitted for COPD exacerbation and decompensated HFrEF 03/14/22.  Reviewed I/O's: -1.6 L x 24 hours and +5.2 L since admission   Spoke with pt at bedside, who reports feeling a little better today. He shares he usually has good appetite and was consuming 3 meals per day, but was unable to provide detailed diet recall. Noted pt consumed about 50% of breakfast. Per pt, he did not eat his breakfast, as he did not like the items served. RD asked pt what he usually eats at breakfast, but was unable to tell this RD. Offered to order alternative tray for pt, however, he declined. Pt shares that he is drinking Ensure supplements. RD also provided pt chocolate milk per his request. Documented meal completions 50-95%/   Pt denies any weight loss. Reviewed wt hx; pt has experienced a 4.9% wt loss over the past month, which is not significant for time frame.   Discussed importance of good meal and supplement intake to promote healing.   Pt tested positive for COVID-19 on 03/22/22. Per TOC notes, pt will remain in hospital until he can return to [place] on 04/02/22 (10 day quarantine period).   Medications reviewed and  include lasix and prednisone.   Labs reviewed: Na: 133, CBGS: 105-409 (inpatient orders for glycemic control are 0-15 units insulin aspart TID with meals,0-5 units insulin aspart daily at bedtime, and 8 units insulin glargine-yfgn dauly).    NUTRITION - FOCUSED PHYSICAL EXAM:  Flowsheet Row Most Recent Value  Orbital Region No depletion  Upper Arm Region No depletion  Thoracic and Lumbar Region No depletion  Buccal Region No depletion  Temple Region No depletion  Clavicle Bone Region No depletion  Clavicle and Acromion Bone Region No depletion  Scapular Bone Region No depletion  Dorsal Hand No depletion  Patellar Region No depletion  Anterior Thigh Region No depletion  Posterior Calf Region No depletion  Edema (RD Assessment) Mild  Hair Reviewed  Eyes Reviewed  Mouth Reviewed  Skin Reviewed  Nails Reviewed       Diet Order:   Diet Order             Diet 2 gram sodium Room service appropriate? Yes; Fluid consistency: Thin  Diet effective now                   EDUCATION NEEDS:   No education needs have been identified at this time  Skin:  Skin Assessment: Reviewed RN Assessment  Last BM:  03/28/22 (type 1)  Height:   Ht Readings from Last 1 Encounters:  03/24/22 5\' 9"  (1.753 m)    Weight:   Wt Readings from Last 1 Encounters:  03/25/22 82.7 kg    Ideal Body  Weight:  72.7 kg  BMI:  Body mass index is 26.92 kg/m.  Estimated Nutritional Needs:   Kcal:  2200-2400  Protein:  110-125 grams  Fluid:  > 2 L    Levada Schilling, RD, LDN, CDCES Registered Dietitian II Certified Diabetes Care and Education Specialist Please refer to [facility] for RD and/or RD on-call/weekend/after hours pager

## 2022-03-28 NOTE — Telephone Encounter (Signed)
Pharmacy Patient Advocate Encounter  Insurance verification completed.    The patient is insured through [employer]   The patient is currently admitted and ran test claims for the following: Ezetimibe 10mg .  Copays and coinsurance results were relayed to Inpatient clinical team.

## 2022-03-28 NOTE — Progress Notes (Addendum)
[facility] Progress Note  Patient: Carl [name]    [id number]  DOA: 03/22/2022    Date of Service: the patient was seen and examined on 03/28/2022  Brief hospital course: 61 year old male with past medical history of bipolar disorder, chronic systolic heart failure with ejection fraction of 25%, CAD status post non-STEMI, CVA, seizure disorder and COPD with recent hospitalization for CHF and COPD exacerbations who presented to the emergency room on 9/15 with shortness of breath and chest pain and found to have hypoxia.  Patient was admitted for CHF exacerbation.  Also noted to have elevated troponins and cardiology consulted.  After patient admitted, COVID PCR came back positive.  Patient started on IV Remdisivir and IV steroids.  Over the next few days, patient's oxygenation has improved.  Initially was on BiPAP and has been able to be weaned off of oxygen to room air.  Has diuresed almost 10 L.  Is requiring to stay in the hospital cannot return to ALF until 9/25, 10 days post positive COVID  Assessment and Plan: Assessment and Plan: * Elevated troponin Felt to more demand ischemia in the setting of respiratory issues, heart failure COVID.  Cardiology following.  Acute respiratory failure with hypoxia (HCC)-resolved as of 03/28/2022 Multifactorial felt to be secondary to COVID, CHF and COPD.  Able to be weaned off of oxygen altogether.  Acute on chronic HFrEF (heart failure with reduced ejection fraction) (HCC) Last echo with EF 23%. Patient presents with increased SOB with rales on exam and rising BNP.  Responding to IV Lasix and has diuresed almost 10 L and is more than -5 L deficient.  COPD exacerbation (HCC) Continue nebulizers, tapering steroids  COVID-19 virus infection Given hypoxia, started on Remdisivir and Solu-Medrol.  CRP level trending down.  Remdisivir completed 9/18.  Solu-Medrol changed over to p.o. prednisone for quick taper.  Although patient has improved, he will  need to stay here until Monday, 9/25, where he will complete a 10-day isolation for COVID  Hypotension-resolved as of 03/26/2022 Had some issues with hypotension, possibly from diuresis.  Since resolved.  Acute renal failure superimposed on stage 3a chronic kidney disease (HCC)-resolved as of 03/26/2022 Baseline around 1.1.  Creatinine briefly rose, then improved, despite diuresis.  Resolved by 9/19  Bipolar 1 disorder [facility]) Patient with psych issues. Has also suffered multifactorial encephalopathy. Social Worker states that he is under guardianship and has been deemed incompetent to make medical decisions. Therefore, DSS needs to grant consent for any procedures contemplated. The social worker also stated that he is not competent to make a decision regarding Code status although the patient states he would want resuscitation.  We will plan to continue the patient's psychiatric medications.  Type 2 diabetes mellitus with hyperglycemia (HCC) Worsening blood sugars due to being on Solu-Medrol plus underlying illness.  No recent A1c on chart so we will check.  Appreciate diabetes educator help.  Lantus added.  CBGs should improve as we are continue to trend his steroids down.  A1c at 6.6.  Paroxysmal atrial fibrillation (HCC) Patient in fib/flutter on EKG. Eliquis restarted 9/19 after 48 hours of IV heparin.  Beta-blocker restarted  Seizure ([place]) Continue home meds. Seizure precautions  Coronary artery disease Patient with known CAD. S/P CABG. He reports that since being home from hospital 03/21/22 he has had increased chest pain - a heavy pressure in his chest. Troponins: 197 to 677. No acute changes on EKG but LBBB and pacer limit interpretation.  As per cardiology, patient has  been on IV heparin for 48 hours.  HLD (hyperlipidemia) Continue statin therapy  Hypothyroidism Continue Synthroid  Overweight (BMI 25.0-29.9) Meets criteria BMI greater than 25       Body mass index is 26.92  kg/m.  Nutrition Problem: Increased nutrient needs Etiology: chronic illness (CHF, COPD)     Consultants: Cardiology  Procedures: None  Antimicrobials: IV Remdisivir 9/16-9/18  Code Status: Full code   Subjective: Breathing is okay  Objective: Few episodes of hypotension Vitals:   03/28/22 0405 03/28/22 1245  BP: 125/80 103/75  Pulse: 81 91  Resp: 20 18  Temp: 97.8 F (36.6 C) 97.9 F (36.6 C)  SpO2: 99% 97%    Intake/Output Summary (Last 24 hours) at 03/28/2022 1628 Last data filed at 03/28/2022 1100 Gross per 24 hour  Intake 240 ml  Output 2400 ml  Net -2160 ml   Filed Weights   03/22/22 1100 03/25/22 0500  Weight: 85.5 kg 82.7 kg   Body mass index is 26.92 kg/m.  Exam:  General: Oriented x2, no acute distress HEENT: Normocephalic, atraumatic, mucous membranes slightly dry Cardiovascular: Regular rate and rhythm, S1-S2, 2 out of 6 systolic ejection murmur Respiratory: Decreased breath sounds throughout, no wheezing Abdomen: Soft, nontender, nondistended, positive bowel sounds Musculoskeletal: No clubbing or cyanosis, trace pitting edema Skin: No skin breaks, tears or lesions Psychiatry: Patient has underlying bipolar disorder and is not felt to be competent to make his own decisions. Neurology: No focal deficits  Data Reviewed: Creatinine stable, white blood cell count at 16  Disposition:  Status is: Inpatient Remains inpatient appropriate because:  -Completion of 10-day isolation for COVID    Anticipated discharge date: 9/25  Family Communication: Patient is a ward of the state.  Updated her on 9/19 and we discussed his previous CODE STATUS (patient made DNR at previous hospitalization and they are deciding whether to rescind that or continue) DVT Prophylaxis: Completed 48 hours of IV heparin apixaban (ELIQUIS) tablet 5 mg    Author: Annita Brod ,MD 03/28/2022 4:28 PM  To reach On-call, see care teams to locate the attending and  reach out via [email or web address]. Between 7PM-7AM, please contact night-coverage If you still have difficulty reaching the attending provider, please page the [facility] (Director on Call) for [facility] on amion for assistance.

## 2022-03-28 NOTE — TOC Benefit Eligibility Note (Signed)
Patient Research scientist (life sciences) completed.     The patient is currently admitted and upon discharge could be taking Ezetimibe 10mg .   The current 30 day co-pay is, $4 .   The patient is insured through [place].

## 2022-03-28 NOTE — Progress Notes (Signed)
Inpatient Diabetes Program Recommendations  AACE/ADA: New Consensus Statement on Inpatient Glycemic Control (2015)  Target Ranges:  Prepandial:   less than 140 mg/dL      Peak postprandial:   less than 180 mg/dL (1-2 hours)      Critically ill patients:  140 - 180 mg/dL   Lab Results  Component Value Date   GLUCAP 304 (H) 03/28/2022   HGBA1C 6.6 (H) 03/25/2022    Review of Glycemic Control  Latest Reference Range & Units 03/27/22 08:29 03/27/22 12:24 03/27/22 18:06 03/27/22 21:47 03/28/22 09:00 03/28/22 12:40  Glucose-Capillary 70 - 99 mg/dL 200 (H) 172 (H) 273 (H) 409 (H) 105 (H) 304 (H)   Diabetes history: DM 2 Outpatient Diabetes medications:  None noted Current orders for Inpatient glycemic control:  Novolog 0-15 units tid with meals and HS Semglee 8 unit daily Prednisone taper  Inpatient Diabetes Program Recommendations:    It appears that steroids are increasing post-prandial blood sugars.  Consider adding Novolog meal coverage 2 units tid with meals (hold if patient eats less than 50% or NPO).   Thanks,  Adah Perl, RN, BC-ADM Inpatient Diabetes Coordinator Pager [phone]  (8a-5p)

## 2022-03-28 NOTE — Progress Notes (Addendum)
Cardiology Progress Note   Patient Name: Carl [name] Date of Encounter: 03/28/2022  Primary Cardiologist: Lorine Bears, MD  Subjective   Breathing a little bit better.  Notes good response to lasix.  Inpatient Medications    Scheduled Meds:  apixaban  5 mg Oral BID   divalproex  500 mg Oral BID   escitalopram  10 mg Oral QHS   feeding supplement  237 mL Oral TID BM   furosemide  40 mg Oral Daily   hydrOXYzine  10 mg Oral BID   insulin aspart  0-15 Units Subcutaneous TID WC   insulin aspart  0-5 Units Subcutaneous QHS   insulin glargine-yfgn  8 Units Subcutaneous QHS   levETIRAcetam  250 mg Oral BID   levothyroxine  25 mcg Oral Daily   multivitamin with minerals  1 tablet Oral Daily   mouth rinse  15 mL Mouth Rinse 4 times per day   pantoprazole  80 mg Oral Daily   [START ON 03/29/2022] predniSONE  30 mg Oral Q breakfast   Followed by   Melene Muller ON 03/31/2022] predniSONE  20 mg Oral Q breakfast   Followed by   Melene Muller ON 04/02/2022] predniSONE  10 mg Oral Q breakfast   QUEtiapine  200 mg Oral QHS   rosuvastatin  40 mg Oral Daily   tiotropium  18 mcg Inhalation Daily   Continuous Infusions:  PRN Meds: acetaminophen, albuterol, mouth rinse, traMADol   Vital Signs    Vitals:   03/27/22 2152 03/28/22 0111 03/28/22 0405 03/28/22 1245  BP: 118/71 114/67 125/80 103/75  Pulse: 87 71 81 91  Resp: 18 20 20 18   Temp: 97.9 F (36.6 C) 97.9 F (36.6 C) 97.8 F (36.6 C) 97.9 F (36.6 C)  TempSrc:   Oral Oral  SpO2: 97% 97% 99% 97%  Weight:      Height:        Intake/Output Summary (Last 24 hours) at 03/28/2022 1300 Last data filed at 03/28/2022 1100 Gross per 24 hour  Intake 240 ml  Output 2400 ml  Net -2160 ml   Filed Weights   03/22/22 1100 03/25/22 0500  Weight: 85.5 kg 82.7 kg    Physical Exam   GEN: Well nourished, well developed, in no acute distress.  HEENT: Grossly normal.  Neck: Supple, mod elev JVP, no carotid bruits or masses. Cardiac: IR, IR,  3/6 SEM throughout, no rubs or gallops. No clubbing, cyanosis, edema.  Radials 2+, DP/PT 2+ and equal bilaterally.  Respiratory:  Respirations regular and unlabored, scattered rhonchi and exp wheezing throughout. GI: Soft, nontender, nondistended, BS + x 4. MS: no deformity or atrophy. Skin: warm and dry, no rash. Neuro:  Strength and sensation are intact. Psych: AAOx3.  Normal affect.  Labs    Chemistry Recent Labs  Lab 03/25/22 0700 03/26/22 0648 03/28/22 0728  NA 131* 136 133*  K 4.9 4.6 4.8  CL 96* 101 98  CO2 26 28 28   GLUCOSE 352* 115* 124*  BUN 51* 42* 39*  CREATININE 1.34* 1.09 1.10  CALCIUM 9.3 8.8* 9.5  GFRNONAA 60* >60 >60  ANIONGAP 9 7 7      Hematology Recent Labs  Lab 03/25/22 0700 03/26/22 0648 03/28/22 0728  WBC 14.6* 14.7* 16.0*  RBC 4.53 4.30 4.21*  HGB 13.0 12.4* 12.1*  HCT 39.7 37.2* 35.9*  MCV 87.6 86.5 85.3  MCH 28.7 28.8 28.7  MCHC 32.7 33.3 33.7  RDW 17.5* 17.0* 17.1*  PLT 175 172 149*  Cardiac Enzymes  Recent Labs  Lab 03/18/22 1958 03/22/22 0724 03/22/22 0854 03/22/22 1655 03/22/22 2235  TROPONINIHS 122* 197* 677* 1,543* 1,438*      BNP    Component Value Date/Time   BNP 689.8 (H) 03/22/2022 0725   Lipids  Lab Results  Component Value Date   CHOL 132 03/28/2022   HDL 44 03/28/2022   LDLCALC 67 03/28/2022   TRIG 107 03/28/2022   CHOLHDL 3.0 03/28/2022    HbA1c  Lab Results  Component Value Date   HGBA1C 6.6 (H) 03/25/2022    Radiology    No results found.  Telemetry    V paced, underlying AFib, occas brief runs of NSVT up to 5 beats - Personally Reviewed  Cardiac Studies   2D Echocardiogram 8.2023  1. Left ventricular ejection fraction, by estimation, is 25 to 30%. The  left ventricle has severely decreased function. The left ventricle  demonstrates regional wall motion abnormalities (see scoring  diagram/findings for description). The left  ventricular internal cavity size was moderately dilated.  There is mild  concentric left ventricular hypertrophy. Left ventricular diastolic  parameters are indeterminate. Elevated left ventricular end-diastolic  pressure.   2. Right ventricular systolic function is normal. The right ventricular  size is normal.   3. Left atrial size was severely dilated.   4. Right atrial size was mildly dilated.   5. The mitral valve is normal in structure. Mild mitral valve  regurgitation. No evidence of mitral stenosis.   6. The aortic valve is tricuspid. Aortic valve regurgitation is mild. No  aortic stenosis is present.   7. The inferior vena cava is normal in size with <50% respiratory  variability, suggesting right atrial pressure of 8 mmHg.   Patient Profile     61 y.o. male with past medical history of CAD status post CABG in 2017, ischemic cardiomyopathy with known EF of 25-30%, status post ICD placement in 2017, COPD, paroxysmal atrial fibrillation on chronic apixaban, CVA, bipolar disorder, current tobacco use, admitted with acute respiratory failure with hypoxia in the setting of COVID-19 infection and acute on chronic HFrEF.  Assessment & Plan    1.  Acute respiratory failure w/ hypoxia; COPD; COVID-19 infection:  Per IM.  Feels that breathing improving.  2.  Acute on chronic HFrEF/ICM:  EF 25-30% by echo in 02/2022.  Volume up on 9/20 prompting initiation of IV lasix.  Minus 1.6 L yesterday and minus 6.2 L since admission.  Wt now lower than prior outpt wts @ 82.7 kg. JVD on exam.  Lung exam most notable for rhonchi and wheezing and he is due a breathing tx now.  Was on carvedilol 3.125 bid, lisinopril 2.5 bid, and lasix 40 daily @ home previously.  Pressures have been soft here (home meds held), though now stable off of midodrine.  Cont IV lasix today.  I will resume low dose bb given ongoing tachy.  If pressures stable, can prob resume acei tomorrow.  3.  CAD/Supply-demand Ischemia:  s/p CABG in 2017 w/ nonischemic MV in 2021.  In setting COVID and  CHF, HsTrop increased to 1543.  No chest pain.  Suspect demand ischemia in the setting of resp failure.  No plan for ischemic eval this admission.  Cont statin.  No asa in setting of eliquis.  Adding low dose ? blocker.  4.  PAF:  V paced w/ underlying afib. Rates in low 100's - off of home dose of ? blocker currently.  Adding in home  dose of carvedilol - watch for worsening of wheezing.  Cont eliquis. Follow.   5.  Hypotension:  Pressures stable off of midodrine.  6.  Tob abuse:  Cessation advised.  Signed, Nicolasa Ducking, NP  03/28/2022, 1:00 PM    For questions or updates, please contact   Please consult [email or web address] for contact info under Cardiology/STEMI.

## 2022-03-29 DIAGNOSIS — I480 Paroxysmal atrial fibrillation: Secondary | ICD-10-CM | POA: Diagnosis not present

## 2022-03-29 DIAGNOSIS — R778 Other specified abnormalities of plasma proteins: Secondary | ICD-10-CM | POA: Diagnosis not present

## 2022-03-29 DIAGNOSIS — R569 Unspecified convulsions: Secondary | ICD-10-CM

## 2022-03-29 DIAGNOSIS — J441 Chronic obstructive pulmonary disease with (acute) exacerbation: Secondary | ICD-10-CM | POA: Diagnosis not present

## 2022-03-29 DIAGNOSIS — I5023 Acute on chronic systolic (congestive) heart failure: Secondary | ICD-10-CM | POA: Diagnosis not present

## 2022-03-29 DIAGNOSIS — U071 COVID-19: Secondary | ICD-10-CM | POA: Diagnosis not present

## 2022-03-29 DIAGNOSIS — J9620 Acute and chronic respiratory failure, unspecified whether with hypoxia or hypercapnia: Secondary | ICD-10-CM | POA: Diagnosis not present

## 2022-03-29 LAB — CBC
HCT: 40.4 % (ref 39.0–52.0)
Hemoglobin: 13.8 g/dL (ref 13.0–17.0)
MCH: 29.6 pg (ref 26.0–34.0)
MCHC: 34.2 g/dL (ref 30.0–36.0)
MCV: 86.5 fL (ref 80.0–100.0)
Platelets: 214 10*3/uL (ref 150–400)
RBC: 4.67 MIL/uL (ref 4.22–5.81)
RDW: 17.5 % — ABNORMAL HIGH (ref 11.5–15.5)
WBC: 17.5 10*3/uL — ABNORMAL HIGH (ref 4.0–10.5)
nRBC: 1 % — ABNORMAL HIGH (ref 0.0–0.2)

## 2022-03-29 LAB — BASIC METABOLIC PANEL
Anion gap: 8 (ref 5–15)
BUN: 45 mg/dL — ABNORMAL HIGH (ref 8–23)
CO2: 27 mmol/L (ref 22–32)
Calcium: 9.9 mg/dL (ref 8.9–10.3)
Chloride: 100 mmol/L (ref 98–111)
Creatinine, Ser: 1.18 mg/dL (ref 0.61–1.24)
GFR, Estimated: >60 mL/min (ref >60)
Glucose, Bld: 90 mg/dL (ref 70–99)
Potassium: 5 mmol/L (ref 3.5–5.1)
Sodium: 135 mmol/L (ref 135–145)

## 2022-03-29 LAB — GLUCOSE, CAPILLARY
Glucose-Capillary: 89 mg/dL (ref 70–99)
Glucose-Capillary: 174 mg/dL — ABNORMAL HIGH (ref 70–99)
Glucose-Capillary: 295 mg/dL — ABNORMAL HIGH (ref 70–99)
Glucose-Capillary: 152 mg/dL — ABNORMAL HIGH (ref 70–99)

## 2022-03-29 MED ORDER — DIGOXIN 250 MCG PO TABS
0.2500 mg | ORAL_TABLET | Freq: Once | ORAL | Status: AC
  Administered 2022-03-29: 0.25 mg via ORAL
  Filled 2022-03-29: qty 1

## 2022-03-29 MED ORDER — DIGOXIN 125 MCG PO TABS
0.1250 mg | ORAL_TABLET | Freq: Every day | ORAL | Status: DC
  Administered 2022-03-30 – 2022-04-01 (×3): 0.125 mg via ORAL
  Filled 2022-03-29 (×3): qty 1

## 2022-03-29 NOTE — Progress Notes (Signed)
[facility] Progress Note  Patient: Carl [name]    [id number]  DOA: 03/22/2022    Date of Service: the patient was seen and examined on 03/29/2022  Brief hospital course: 60 year old male with past medical history of bipolar disorder, chronic systolic heart failure with ejection fraction of 25%, CAD status post non-STEMI, CVA, seizure disorder and COPD with recent hospitalization for CHF and COPD exacerbations who presented to the emergency room on 9/15 with shortness of breath and chest pain and found to have hypoxia.  Patient was admitted for CHF exacerbation.  Also noted to have elevated troponins and cardiology consulted.  After patient admitted, COVID PCR came back positive.  Patient started on IV Remdisivir and IV steroids.  Over the next few days, patient's oxygenation has improved.  Initially was on BiPAP and has been able to be weaned off of oxygen to room air.  Has diuresed almost 10 L.  Is requiring to stay in the hospital cannot return to ALF until 9/25, 10 days post positive COVID  Assessment and Plan: Assessment and Plan: * Elevated troponin Felt to more demand ischemia in the setting of respiratory issues, heart failure COVID.  Cardiology following.  Acute respiratory failure with hypoxia (HCC)-resolved as of 03/28/2022 Multifactorial felt to be secondary to COVID, CHF and COPD.  Able to be weaned off of oxygen altogether.  Acute on chronic HFrEF (heart failure with reduced ejection fraction) (HCC) Last echo with EF 23%. Patient presents with increased SOB with rales on exam and rising BNP.  Responding to IV Lasix and has diuresed almost 10 L and is more than -5 L deficient.  COPD exacerbation (HCC) Continue nebulizers, tapering steroids  COVID-19 virus infection Given hypoxia, started on Remdisivir and Solu-Medrol.  CRP level trending down.  Remdisivir completed 9/18.  Solu-Medrol changed over to p.o. prednisone for quick taper.  Although patient has improved, he will  need to stay here until Monday, 9/25, where he will complete a 10-day isolation for COVID  Hypotension-resolved as of 03/26/2022 Had some issues with hypotension, possibly from diuresis.  Since resolved.  Acute renal failure superimposed on stage 3a chronic kidney disease (HCC)-resolved as of 03/26/2022 Baseline around 1.1.  Creatinine briefly rose, then improved, despite diuresis.  Resolved by 9/19  Bipolar 1 disorder [facility]) Patient with psych issues. Has also suffered multifactorial encephalopathy. Social Worker states that he is under guardianship and has been deemed incompetent to make medical decisions. Therefore, DSS needs to grant consent for any procedures contemplated. The social worker also stated that he is not competent to make a decision regarding Code status although the patient states he would want resuscitation.  We will plan to continue the patient's psychiatric medications.  Type 2 diabetes mellitus with hyperglycemia (HCC) Worsening blood sugars due to being on Solu-Medrol plus underlying illness.  No recent A1c on chart so we will check.  Appreciate diabetes educator help.  Lantus added.  CBGs should improve as we are continue to trend his steroids down.  A1c at 6.6.  Paroxysmal atrial fibrillation (HCC) Patient in fib/flutter on EKG. Eliquis restarted 9/19 after 48 hours of IV heparin.  Beta-blocker restarted  Seizure ([place]) Continue home meds. Seizure precautions  Coronary artery disease Patient with known CAD. S/P CABG. He reports that since being home from hospital 03/21/22 he has had increased chest pain - a heavy pressure in his chest. Troponins: 197 to 677. No acute changes on EKG but LBBB and pacer limit interpretation.  As per cardiology, patient has  been on IV heparin for 48 hours.  HLD (hyperlipidemia) Continue statin therapy  Hypothyroidism Continue Synthroid  Overweight (BMI 25.0-29.9) Meets criteria BMI greater than 25       Body mass index is 26.92  kg/m.  Nutrition Problem: Increased nutrient needs Etiology: chronic illness (CHF, COPD)     Consultants: Cardiology  Procedures: None  Antimicrobials: IV Remdisivir 9/16-9/18  Code Status: Full code   Subjective: No complaints  Objective: Few episodes of hypotension Vitals:   03/29/22 0433 03/29/22 0926  BP: 97/73 98/76  Pulse: 80 63  Resp: 20 18  Temp: 98.2 F (36.8 C) 98 F (36.7 C)  SpO2: 96% 100%    Intake/Output Summary (Last 24 hours) at 03/29/2022 1414 Last data filed at 03/29/2022 1218 Gross per 24 hour  Intake --  Output 2300 ml  Net -2300 ml    Filed Weights   03/22/22 1100 03/25/22 0500  Weight: 85.5 kg 82.7 kg   Body mass index is 26.92 kg/m.  Exam:  General: Oriented x2, no acute distress HEENT: Normocephalic, atraumatic, mucous membranes slightly dry Cardiovascular: Regular rate and rhythm, S1-S2, 2 out of 6 systolic ejection murmur Respiratory: Decreased breath sounds throughout, no wheezing Abdomen: Soft, nontender, nondistended, positive bowel sounds Musculoskeletal: No clubbing or cyanosis, trace pitting edema Skin: No skin breaks, tears or lesions Psychiatry: Patient has underlying bipolar disorder and is not felt to be competent to make his own decisions. Neurology: No focal deficits  Data Reviewed: 9/22 labs stable  Disposition:  Status is: Inpatient Remains inpatient appropriate because:  -Completion of 10-day isolation for COVID    Anticipated discharge date: 9/25  Family Communication: Patient is a ward of the state.  Updated her on 9/19 and we discussed his previous CODE STATUS (patient made DNR at previous hospitalization and they are deciding whether to rescind that or continue) DVT Prophylaxis: Completed 48 hours of IV heparin apixaban (ELIQUIS) tablet 5 mg    Author: Annita Brod ,MD 03/29/2022 2:14 PM  To reach On-call, see care teams to locate the attending and reach out via [email or web address]. Between  7PM-7AM, please contact night-coverage If you still have difficulty reaching the attending provider, please page the [facility] (Director on Call) for [facility] on amion for assistance.

## 2022-03-29 NOTE — Progress Notes (Signed)
Cardiology Progress Note   Patient Name: Carl [name] Date of Encounter: 03/29/2022  Primary Cardiologist: Lorine Bears, MD  Subjective   Breathing about the same. No chest pain.  Feels that his upper abd is tight.  Inpatient Medications    Scheduled Meds:  apixaban  5 mg Oral BID   carvedilol  3.125 mg Oral BID WC   divalproex  500 mg Oral BID   escitalopram  10 mg Oral QHS   ezetimibe  10 mg Oral Daily   feeding supplement (GLUCERNA SHAKE)  237 mL Oral TID BM   furosemide  40 mg Oral Daily   hydrOXYzine  10 mg Oral BID   insulin aspart  0-15 Units Subcutaneous TID WC   insulin aspart  0-5 Units Subcutaneous QHS   insulin glargine-yfgn  8 Units Subcutaneous QHS   levETIRAcetam  250 mg Oral BID   levothyroxine  25 mcg Oral Daily   multivitamin with minerals  1 tablet Oral Daily   mouth rinse  15 mL Mouth Rinse 4 times per day   pantoprazole  80 mg Oral Daily   predniSONE  30 mg Oral Q breakfast   Followed by   Melene Muller ON 03/31/2022] predniSONE  20 mg Oral Q breakfast   Followed by   Melene Muller ON 04/02/2022] predniSONE  10 mg Oral Q breakfast   QUEtiapine  200 mg Oral QHS   rosuvastatin  40 mg Oral Daily   tiotropium  18 mcg Inhalation Daily   Continuous Infusions:  PRN Meds: acetaminophen, albuterol, mouth rinse, traMADol   Vital Signs    Vitals:   03/28/22 2112 03/29/22 0011 03/29/22 0433 03/29/22 0926  BP: 108/74 96/60 97/73  98/76  Pulse: 85 78 80 63  Resp: 20 20 20 18   Temp: 98.3 F (36.8 C) 98 F (36.7 C) 98.2 F (36.8 C) 98 F (36.7 C)  TempSrc:      SpO2: 95% 99% 96% 100%  Weight:      Height:        Intake/Output Summary (Last 24 hours) at 03/29/2022 1100 Last data filed at 03/29/2022 0829 Gross per 24 hour  Intake --  Output 1850 ml  Net -1850 ml   Filed Weights   03/22/22 1100 03/25/22 0500  Weight: 85.5 kg 82.7 kg    Physical Exam   GEN: Well nourished, well developed, in no acute distress.  HEENT: Grossly normal.  Neck: Supple, no  carotid bruits or masses.  Mildly elevated JVP. Cardiac: IR, IR, 2/6 syst murmur throughout, no rubs or gallops. No clubbing, cyanosis, edema.  Radials 2+, DP/PT 2+ and equal bilaterally.  Respiratory:  Respirations regular and unlabored, clear to auscultation bilaterally. GI: Soft, nontender, nondistended, BS + x 4. MS: no deformity or atrophy. Skin: warm and dry, no rash. Neuro:  Strength and sensation are intact. Psych: AAOx3.  Normal affect.  Labs    Chemistry Recent Labs  Lab 03/26/22 0648 03/28/22 0728 03/29/22 0813  NA 136 133* 135  K 4.6 4.8 5.0  CL 101 98 100  CO2 28 28 27   GLUCOSE 115* 124* 90  BUN 42* 39* 45*  CREATININE 1.09 1.10 1.18  CALCIUM 8.8* 9.5 9.9  GFRNONAA >60 >60 >60  ANIONGAP 7 7 8      Hematology Recent Labs  Lab 03/25/22 0700 03/26/22 0648 03/28/22 0728  WBC 14.6* 14.7* 16.0*  RBC 4.53 4.30 4.21*  HGB 13.0 12.4* 12.1*  HCT 39.7 37.2* 35.9*  MCV 87.6 86.5 85.3  MCH 28.7  28.8 28.7  MCHC 32.7 33.3 33.7  RDW 17.5* 17.0* 17.1*  PLT 175 172 149*    Cardiac Enzymes  Recent Labs  Lab 03/18/22 1958 03/22/22 0724 03/22/22 0854 03/22/22 1655 03/22/22 2235  TROPONINIHS 122* 197* 677* 1,543* 1,438*      BNP    Component Value Date/Time   BNP 689.8 (H) 03/22/2022 0725    Lipids  Lab Results  Component Value Date   CHOL 132 03/28/2022   HDL 44 03/28/2022   LDLCALC 67 03/28/2022   TRIG 107 03/28/2022   CHOLHDL 3.0 03/28/2022    HbA1c  Lab Results  Component Value Date   HGBA1C 6.6 (H) 03/25/2022    Radiology    No results found.  Telemetry    V paced, underlying afib, occas PVC, brief runs of NSVT - Personally Reviewed  Cardiac Studies   2D Echocardiogram 8.2023   1. Left ventricular ejection fraction, by estimation, is 25 to 30%. The  left ventricle has severely decreased function. The left ventricle  demonstrates regional wall motion abnormalities (see scoring  diagram/findings for description). The left   ventricular internal cavity size was moderately dilated. There is mild  concentric left ventricular hypertrophy. Left ventricular diastolic  parameters are indeterminate. Elevated left ventricular end-diastolic  pressure.   2. Right ventricular systolic function is normal. The right ventricular  size is normal.   3. Left atrial size was severely dilated.   4. Right atrial size was mildly dilated.   5. The mitral valve is normal in structure. Mild mitral valve  regurgitation. No evidence of mitral stenosis.   6. The aortic valve is tricuspid. Aortic valve regurgitation is mild. No  aortic stenosis is present.   7. The inferior vena cava is normal in size with <50% respiratory  variability, suggesting right atrial pressure of 8 mmHg.   Patient Profile     61 y.o. male with past medical history of CAD status post CABG in 2017, ischemic cardiomyopathy with known EF of 25-30%, status post ICD placement in 2017, COPD, paroxysmal atrial fibrillation on chronic apixaban, CVA, bipolar disorder, current tobacco use, admitted with acute respiratory failure with hypoxia in the setting of COVID-19 infection and acute on chronic HFrEF.  Assessment & Plan    1.  Acute respiratory failure w/ hypoxia; COPD; COVID-19 infection:  Per IM.  2.  Acute on chronic HFrEF/ICM:  EF 25-30% by echo in 02/2022.  Volume up on 9/20 prompting initiation of IV lasix.  Minus 2 L yesterday and minus 4.9 L since admission.  Likely w/ ongoing mild volume overload.  BUN/Creat rising sl this AM.  Agree w/ transitioning lasix to PO.  No weight since 9/18.  Pressures have been soft, but tolerating low dose carvedilol.  No longer req midodrine.  Home dose of lisinopril remains on hold (2.5 mg daily).  See #4 re: afib.   3.  CAD/Supply-demand ischemia:  s/p CABG in 2017 w/ nonischemic MV in 2021.  In setting COVID and CHF, HsTrop increased to 1543.  No chest pain.  Suspect demand ischemia in the setting of resp failure.  No plan for  ischemic eval this admission.  Cont statin.  No asa in setting of eliquis.  Cont ? blocker . 4.  PAF:  V paced w/ underlying afib.  Rates improved w/ resumption of carvedilol.  Pt was in sinus on 8/22 ECG, but in afib on 9/7 admission ECG (COPD exac at that time).  Recurrent afib, may be contributing to  resp failure/CHF and thus we may need to consider rhythm control options following recovery from COVID (AAD vs DCCV).  He is anticoagulated w/ eliquis.  5.  Hypotension:  resolved.  Off of midodrine. Follow as we add back home meds.  6.  Tob abuse:  cessation advised.  Signed, Nicolasa Ducking, NP  03/29/2022, 11:00 AM    For questions or updates, please contact   Please consult [email or web address] for contact info under Cardiology/STEMI.

## 2022-03-30 DIAGNOSIS — I5023 Acute on chronic systolic (congestive) heart failure: Secondary | ICD-10-CM | POA: Diagnosis not present

## 2022-03-30 DIAGNOSIS — J9601 Acute respiratory failure with hypoxia: Secondary | ICD-10-CM | POA: Diagnosis not present

## 2022-03-30 DIAGNOSIS — U071 COVID-19: Secondary | ICD-10-CM | POA: Diagnosis not present

## 2022-03-30 DIAGNOSIS — I248 Other forms of acute ischemic heart disease: Secondary | ICD-10-CM | POA: Diagnosis not present

## 2022-03-30 DIAGNOSIS — R778 Other specified abnormalities of plasma proteins: Secondary | ICD-10-CM | POA: Diagnosis not present

## 2022-03-30 LAB — CULTURE, BLOOD (ROUTINE X 2): Culture: NO GROWTH

## 2022-03-30 LAB — BASIC METABOLIC PANEL
Anion gap: 11 (ref 5–15)
BUN: 52 mg/dL — ABNORMAL HIGH (ref 8–23)
CO2: 24 mmol/L (ref 22–32)
Calcium: 9.5 mg/dL (ref 8.9–10.3)
Chloride: 96 mmol/L — ABNORMAL LOW (ref 98–111)
Creatinine, Ser: 1.2 mg/dL (ref 0.61–1.24)
GFR, Estimated: >60 mL/min (ref >60)
Glucose, Bld: 105 mg/dL — ABNORMAL HIGH (ref 70–99)
Potassium: 4.7 mmol/L (ref 3.5–5.1)
Sodium: 131 mmol/L — ABNORMAL LOW (ref 135–145)

## 2022-03-30 LAB — GLUCOSE, CAPILLARY
Glucose-Capillary: 133 mg/dL — ABNORMAL HIGH (ref 70–99)
Glucose-Capillary: 258 mg/dL — ABNORMAL HIGH (ref 70–99)
Glucose-Capillary: 212 mg/dL — ABNORMAL HIGH (ref 70–99)
Glucose-Capillary: 113 mg/dL — ABNORMAL HIGH (ref 70–99)

## 2022-03-30 NOTE — Progress Notes (Signed)
Cardiology Progress Note   Patient Name: Carl [name] Date of Encounter: 03/30/2022  Primary Cardiologist: Lorine Bears, MD  Subjective   Feels well this morning.  Denies chest pain, dyspnea, palpitations.  Looking forward to going back to skilled nursing facility.  Inpatient Medications    Scheduled Meds:  apixaban  5 mg Oral BID   carvedilol  3.125 mg Oral BID WC   digoxin  0.125 mg Oral Daily   divalproex  500 mg Oral BID   escitalopram  10 mg Oral QHS   ezetimibe  10 mg Oral Daily   feeding supplement (GLUCERNA SHAKE)  237 mL Oral TID BM   furosemide  40 mg Oral Daily   hydrOXYzine  10 mg Oral BID   insulin aspart  0-15 Units Subcutaneous TID WC   insulin aspart  0-5 Units Subcutaneous QHS   insulin glargine-yfgn  8 Units Subcutaneous QHS   levETIRAcetam  250 mg Oral BID   levothyroxine  25 mcg Oral Daily   multivitamin with minerals  1 tablet Oral Daily   mouth rinse  15 mL Mouth Rinse 4 times per day   pantoprazole  80 mg Oral Daily   predniSONE  30 mg Oral Q breakfast   Followed by   Melene Muller ON 03/31/2022] predniSONE  20 mg Oral Q breakfast   Followed by   Melene Muller ON 04/02/2022] predniSONE  10 mg Oral Q breakfast   QUEtiapine  200 mg Oral QHS   rosuvastatin  40 mg Oral Daily   tiotropium  18 mcg Inhalation Daily   Continuous Infusions:  PRN Meds: acetaminophen, albuterol, mouth rinse, traMADol   Vital Signs    Vitals:   03/29/22 0926 03/29/22 1957 03/30/22 0048 03/30/22 0451  BP: 98/76 108/75 98/68 111/69  Pulse: 63  71   Resp: 18 18 19 18   Temp: 98 F (36.7 C) 98.3 F (36.8 C) 98.6 F (37 C) 98.5 F (36.9 C)  TempSrc:  Oral Oral Oral  SpO2: 100% 97% 97% 98%  Weight:      Height:        Intake/Output Summary (Last 24 hours) at 03/30/2022 0735 Last data filed at 03/30/2022 0500 Gross per 24 hour  Intake 1434 ml  Output 3150 ml  Net -1716 ml   Filed Weights   03/22/22 1100 03/25/22 0500  Weight: 85.5 kg 82.7 kg    Physical Exam   GEN:  Well nourished, well developed, in no acute distress.  HEENT: Grossly normal.  Neck: Supple, no JVD, carotid bruits, or masses. Cardiac: RRR, 2 out of 6 systolic murmur at the lower sternal border/apex, no rubs or gallops.  No clubbing, cyanosis, edema.  Radials 2+, DP/PT 2+ and equal bilaterally.  Respiratory:  Respirations regular and unlabored, clear to auscultation bilaterally. GI: Soft, nontender, nondistended, BS + x 4. MS: no deformity or atrophy. Skin: warm and dry, no rash. Neuro:  Strength and sensation are intact. Psych: AAOx3.  Flat affect.  Labs    Chemistry Recent Labs  Lab 03/26/22 0648 03/28/22 0728 03/29/22 0813  NA 136 133* 135  K 4.6 4.8 5.0  CL 101 98 100  CO2 28 28 27   GLUCOSE 115* 124* 90  BUN 42* 39* 45*  CREATININE 1.09 1.10 1.18  CALCIUM 8.8* 9.5 9.9  GFRNONAA >60 >60 >60  ANIONGAP 7 7 8      Hematology Recent Labs  Lab 03/26/22 0648 03/28/22 0728 03/29/22 0813  WBC 14.7* 16.0* 17.5*  RBC 4.30 4.21* 4.67  HGB 12.4* 12.1* 13.8  HCT 37.2* 35.9* 40.4  MCV 86.5 85.3 86.5  MCH 28.8 28.7 29.6  MCHC 33.3 33.7 34.2  RDW 17.0* 17.1* 17.5*  PLT 172 149* 214    Cardiac Enzymes  Recent Labs  Lab 03/18/22 1958 03/22/22 0724 03/22/22 0854 03/22/22 1655 03/22/22 2235  TROPONINIHS 122* 197* 677* 1,543* 1,438*      BNP    Component Value Date/Time   BNP 689.8 (H) 03/22/2022 0725   Lipids  Lab Results  Component Value Date   CHOL 132 03/28/2022   HDL 44 03/28/2022   LDLCALC 67 03/28/2022   TRIG 107 03/28/2022   CHOLHDL 3.0 03/28/2022    HbA1c  Lab Results  Component Value Date   HGBA1C 6.6 (H) 03/25/2022    Radiology    No results found.  Telemetry    Ventricular paced with underlying atrial fibrillation in the 70s to 80s.  Occasional PVCs, couplets, and up to 3 beats of nonsustained VT- Personally Reviewed  Cardiac Studies   2D Echocardiogram 8.2023   1. Left ventricular ejection fraction, by estimation, is 25 to 30%.  The  left ventricle has severely decreased function. The left ventricle  demonstrates regional wall motion abnormalities (see scoring  diagram/findings for description). The left  ventricular internal cavity size was moderately dilated. There is mild  concentric left ventricular hypertrophy. Left ventricular diastolic  parameters are indeterminate. Elevated left ventricular end-diastolic  pressure.   2. Right ventricular systolic function is normal. The right ventricular  size is normal.   3. Left atrial size was severely dilated.   4. Right atrial size was mildly dilated.   5. The mitral valve is normal in structure. Mild mitral valve  regurgitation. No evidence of mitral stenosis.   6. The aortic valve is tricuspid. Aortic valve regurgitation is mild. No  aortic stenosis is present.   7. The inferior vena cava is normal in size with <50% respiratory  variability, suggesting right atrial pressure of 8 mmHg.   Patient Profile     61 y.o. male with past medical history of CAD status post CABG in 2017, ischemic cardiomyopathy with known EF of 25-30%, status post ICD placement in 2017, COPD, paroxysmal atrial fibrillation on chronic apixaban, CVA, bipolar disorder, current tobacco use, admitted with acute respiratory failure with hypoxia in the setting of COVID-19 infection and acute on chronic HFrEF.  Assessment & Plan    1.  Acute respiratory failure w/ hypoxia; COPD; COVID-19 infection: Patient feels breathing is back to baseline.  Per IM.  2.  Acute on chronic HFrEF/ICM:  EF 25-30% by echo in 02/2022.  Volume up on 9/20 prompting initiation of IV lasix.  Transitioned to PO lasix 9/22.  Minus 1.7L yesterday.  Minus 8.8 L since admission.   No wt since 9/18.  Euvolemic on examination and without significant dyspnea.  Pressures stable (on midodrine earlier in admission).  Cont ? blocker . On lisinopril 2.5mg  daily @ home, which has been on hold.  Still w/ some pressures in the 90's - will  hold.  Digoxin started 9/22 for additional rate control r/t Afib (see below).  Can consider SGLT2 inhibitor early with predominantly bedridden status, likely not an ideal candidate.  3.  CAD/Supply-deman ischemia:  s/p CABG in 2017 w/ nonischemic MV in 2021.  In setting COVID and CHF, HsTrop increased to 1543.  No c/p.  Suspect demand ischemia in the setting of resp failure.  No plan for ischemic eval this admission -  reconsider in outpt setting following recovery.  Cont statin.  No asa in setting of eliquis.  Cont ? blocker.  4.  4.  PAF:  V paced w/ underlying afib.  Rates improved w/ resumption of carvedilol.  Pt was in sinus on 8/22 ECG, but in afib on 9/7 admission ECG (COPD exac at that time).  Recurrent afib, likely contributing to resp failure/CHF and thus we may need to consider rhythm control options following recovery from COVID (AAD vs DCCV).  Digoxin added to ? blocker therapy on 9/22. F/u digoxin level early next week.  He is anticoagulated w/ eliquis.  5.  Hypotension:  resolved - off of midodrine.  6.  Tob Abuse:  Cessation advised.  Signed, Nicolasa Ducking, NP  03/30/2022, 7:35 AM    For questions or updates, please contact   Please consult [email or web address] for contact info under Cardiology/STEMI. d

## 2022-03-30 NOTE — Progress Notes (Signed)
[facility] Progress Note  Patient: Carl [name]    [id number]  DOA: 03/22/2022    Date of Service: the patient was seen and examined on 03/30/2022  Brief hospital course: 61 year old male with past medical history of bipolar disorder, chronic systolic heart failure with ejection fraction of 25%, CAD status post non-STEMI, CVA, seizure disorder and COPD with recent hospitalization for CHF and COPD exacerbations who presented to the emergency room on 9/15 with shortness of breath and chest pain and found to have hypoxia.  Patient was admitted for CHF exacerbation.  Also noted to have elevated troponins and cardiology consulted.  After patient admitted, COVID PCR came back positive.  Patient started on IV Remdisivir and IV steroids.  Over the next few days, patient's oxygenation has improved.  Initially was on BiPAP and has been able to be weaned off of oxygen to room air.  Has diuresed almost 10 L.  Is requiring to stay in the hospital cannot return to ALF until 9/25, 10 days post positive COVID  Assessment and Plan: Assessment and Plan: * Elevated troponin Felt to more demand ischemia in the setting of respiratory issues, heart failure COVID.  Cardiology following.  Acute respiratory failure with hypoxia (HCC)-resolved as of 03/28/2022 Multifactorial felt to be secondary to COVID, CHF and COPD.  Able to be weaned off of oxygen altogether.  Acute on chronic HFrEF (heart failure with reduced ejection fraction) (HCC) Last echo with EF 23%. Patient presents with increased SOB with rales on exam and rising BNP.  Responding to IV Lasix and has diuresed almost 10 L and is more than -5 L deficient.  Now changed over to p.o. diuretic  COPD exacerbation (HCC) Continue nebulizers, tapering steroids  COVID-19 virus infection Given hypoxia, started on Remdisivir and Solu-Medrol.  CRP level trending down.  Remdisivir completed 9/18.  Solu-Medrol changed over to p.o. prednisone for quick taper.   Although patient has improved, he will need to stay here until Monday, 9/25, where he will complete a 10-day isolation for COVID  Hypotension-resolved as of 03/26/2022 Had some issues with hypotension, possibly from diuresis.  Since resolved.  Acute renal failure superimposed on stage 3a chronic kidney disease (HCC)-resolved as of 03/26/2022 Baseline around 1.1.  Creatinine briefly rose, then improved, despite diuresis.  Resolved by 9/19  Bipolar 1 disorder [facility]) Patient with psych issues. Has also suffered multifactorial encephalopathy. Social Worker states that he is under guardianship and has been deemed incompetent to make medical decisions. Therefore, DSS needs to grant consent for any procedures contemplated. The social worker also stated that he is not competent to make a decision regarding Code status although the patient states he would want resuscitation.  We will plan to continue the patient's psychiatric medications.  Type 2 diabetes mellitus with hyperglycemia (HCC) Worsening blood sugars due to being on Solu-Medrol plus underlying illness.  No recent A1c on chart so we will check.  Appreciate diabetes educator help.  Lantus added.  CBGs should improve as we are continue to trend his steroids down.  A1c at 6.6.  Paroxysmal atrial fibrillation (HCC) Patient in fib/flutter on EKG. Eliquis restarted 9/19 after 48 hours of IV heparin.  Beta-blocker restarted  Seizure ([place]) Continue home meds. Seizure precautions  Coronary artery disease Patient with known CAD. S/P CABG. He reports that since being home from hospital 03/21/22 he has had increased chest pain - a heavy pressure in his chest. Troponins: 197 to 677. No acute changes on EKG but LBBB and pacer limit  interpretation.  As per cardiology, patient has been on IV heparin for 48 hours.  HLD (hyperlipidemia) Continue statin therapy  Hypothyroidism Continue Synthroid  Overweight (BMI 25.0-29.9) Meets criteria BMI greater than  25       Body mass index is 26.92 kg/m.  Nutrition Problem: Increased nutrient needs Etiology: chronic illness (CHF, COPD)     Consultants: Cardiology  Procedures: None  Antimicrobials: IV Remdisivir 9/16-9/18  Code Status: Full code   Subjective: No complaints, breathing fine  Objective: Few episodes of hypotension Vitals:   03/30/22 0814 03/30/22 0945  BP: 103/78 101/84  Pulse: 71 88  Resp: 14   Temp: 98.3 F (36.8 C) 97.6 F (36.4 C)  SpO2: 95% 98%    Intake/Output Summary (Last 24 hours) at 03/30/2022 1449 Last data filed at 03/30/2022 0800 Gross per 24 hour  Intake 1434 ml  Output 2050 ml  Net -616 ml    Filed Weights   03/22/22 1100 03/25/22 0500  Weight: 85.5 kg 82.7 kg   Body mass index is 26.92 kg/m.  Exam:  General: Oriented x2, no acute distress HEENT: Normocephalic, atraumatic, mucous membranes slightly dry Cardiovascular: Regular rate and rhythm, S1-S2, 2 out of 6 systolic ejection murmur Respiratory: Decreased breath sounds throughout, no wheezing Abdomen: Soft, nontender, nondistended, positive bowel sounds Musculoskeletal: No clubbing or cyanosis, trace pitting edema Skin: No skin breaks, tears or lesions Psychiatry: Patient has underlying bipolar disorder and is not felt to be competent to make his own decisions. Neurology: No focal deficits  Data Reviewed: Electrolytes and renal function stable  Disposition:  Status is: Inpatient Remains inpatient appropriate because:  -Completion of 10-day isolation for COVID    Anticipated discharge date: 9/25  Family Communication: Patient is a ward of the state.  Updated her on 9/19 and we discussed his previous CODE STATUS (patient made DNR at previous hospitalization and they are deciding whether to rescind that or continue) DVT Prophylaxis: Completed 48 hours of IV heparin apixaban (ELIQUIS) tablet 5 mg    Author: Annita Brod ,MD 03/30/2022 2:49 PM  To reach On-call,  see care teams to locate the attending and reach out via [email or web address]. Between 7PM-7AM, please contact night-coverage If you still have difficulty reaching the attending provider, please page the [facility] (Director on Call) for [facility] on amion for assistance.

## 2022-03-31 DIAGNOSIS — R778 Other specified abnormalities of plasma proteins: Secondary | ICD-10-CM | POA: Diagnosis not present

## 2022-03-31 DIAGNOSIS — U071 COVID-19: Secondary | ICD-10-CM | POA: Diagnosis not present

## 2022-03-31 DIAGNOSIS — J9601 Acute respiratory failure with hypoxia: Secondary | ICD-10-CM | POA: Diagnosis not present

## 2022-03-31 DIAGNOSIS — I5023 Acute on chronic systolic (congestive) heart failure: Secondary | ICD-10-CM | POA: Diagnosis not present

## 2022-03-31 LAB — BASIC METABOLIC PANEL
Anion gap: 8 (ref 5–15)
BUN: 59 mg/dL — ABNORMAL HIGH (ref 8–23)
CO2: 28 mmol/L (ref 22–32)
Calcium: 9.5 mg/dL (ref 8.9–10.3)
Chloride: 99 mmol/L (ref 98–111)
Creatinine, Ser: 1.4 mg/dL — ABNORMAL HIGH (ref 0.61–1.24)
GFR, Estimated: 56 mL/min — ABNORMAL LOW (ref >60)
Glucose, Bld: 91 mg/dL (ref 70–99)
Potassium: 4.6 mmol/L (ref 3.5–5.1)
Sodium: 135 mmol/L (ref 135–145)

## 2022-03-31 LAB — GLUCOSE, CAPILLARY
Glucose-Capillary: 129 mg/dL — ABNORMAL HIGH (ref 70–99)
Glucose-Capillary: 174 mg/dL — ABNORMAL HIGH (ref 70–99)
Glucose-Capillary: 271 mg/dL — ABNORMAL HIGH (ref 70–99)
Glucose-Capillary: 149 mg/dL — ABNORMAL HIGH (ref 70–99)

## 2022-03-31 MED ORDER — EZETIMIBE 10 MG PO TABS: 10.0000 mg | ORAL_TABLET | Freq: Every day | ORAL | 1 refills | Status: DC

## 2022-03-31 MED ORDER — PREDNISONE 10 MG PO TABS: 10.0000 mg | ORAL_TABLET | Freq: Every day | ORAL | 0 refills | Status: DC

## 2022-03-31 MED ORDER — DIGOXIN 125 MCG PO TABS: 0.1250 mg | ORAL_TABLET | Freq: Every day | ORAL | 1 refills | Status: DC

## 2022-03-31 MED ORDER — TRAMADOL HCL 50 MG PO TABS: 50.0000 mg | ORAL_TABLET | Freq: Two times a day (BID) | ORAL | 0 refills | Status: DC | PRN

## 2022-03-31 MED ORDER — GLUCERNA SHAKE PO LIQD: 237.0000 mL | Freq: Three times a day (TID) | ORAL | 0 refills | Status: DC

## 2022-03-31 NOTE — Discharge Summary (Signed)
Physician Discharge Summary   Patient: Carl [name] MRN: [id number] DOB: Nov 23, 1960  Admit date:     03/22/2022  Anticipated discharge date: 04/01/2022  Discharge Physician: Annita Brod   PCP: [facility]   Recommendations at discharge:   New medication: Digoxin 0.125 p.o. daily New medication: Prednisone taper x2 more days Patient being discharged back to his assisted living facility.  Discharge Diagnoses: Principal Problem:   Elevated troponin Active Problems:   COPD exacerbation (HCC)   COVID-19 virus infection   Bipolar 1 disorder (HCC)   Paroxysmal atrial fibrillation (HCC)   Type 2 diabetes mellitus with hyperglycemia (HCC)   Coronary artery disease   Seizure (HCC)   HLD (hyperlipidemia)   Hypothyroidism   Overweight (BMI 25.0-29.9)   Demand ischemia (HCC)  Resolved Problems:   Acute respiratory failure with hypoxia (HCC)   Acute on chronic HFrEF (heart failure with reduced ejection fraction) (HCC)   Hypotension   Acute renal failure superimposed on stage 3a chronic kidney disease [facility])  Hospital Course: 61 year old male with past medical history of bipolar disorder, chronic systolic heart failure with ejection fraction of 25%, CAD status post non-STEMI, CVA, seizure disorder and COPD with recent hospitalization for CHF and COPD exacerbations who presented to the emergency room on 9/15 with shortness of breath and chest pain and found to have hypoxia.  Patient was admitted for CHF exacerbation.  Also noted to have elevated troponins and cardiology consulted.  After patient admitted, COVID PCR came back positive.  Patient started on IV Remdisivir and IV steroids.  Over the next few days, patient's oxygenation has improved.  Initially was on BiPAP and has been able to be weaned off of oxygen to room air.  Has diuresed over 10 L.  Required to stay in the hospital cannot return to ALF until 9/25, 10 days post positive COVID.  Assessment and Plan: *  Elevated troponin Felt to more demand ischemia in the setting of respiratory issues, heart failure COVID.  Cardiology following.  Acute respiratory failure with hypoxia (HCC)-resolved as of 03/28/2022 Multifactorial felt to be secondary to COVID, CHF and COPD.  Able to be weaned off of oxygen altogether.  Acute on chronic HFrEF (heart failure with reduced ejection fraction) (HCC)-resolved as of 03/31/2022 Last echo with EF 23%. Patient presents with increased SOB with rales on exam and rising BNP.  Responding to IV Lasix and has diuresed over 10 L and is more than -8.5 L deficient.  Now changed over to p.o. diuretic  COPD exacerbation (HCC) Continue nebulizers, tapering steroids  COVID-19 virus infection Given hypoxia, started on Remdisivir and Solu-Medrol.  CRP level trending down.  Remdisivir completed 9/18.  Solu-Medrol changed over to p.o. prednisone for quick taper.  Although patient has improved, he will need to stay here until Monday, 9/25, where he will complete a 10-day isolation for COVID  Hypotension-resolved as of 03/26/2022 Had some issues with hypotension, possibly from diuresis.  Since resolved.  Acute renal failure superimposed on stage 3a chronic kidney disease (HCC)-resolved as of 03/26/2022 Baseline around 1.1.  Creatinine briefly rose, then improved, despite diuresis.  Resolved by 9/19  Bipolar 1 disorder [facility]) Patient with psych issues. Has also suffered multifactorial encephalopathy. Social Worker states that he is under guardianship and has been deemed incompetent to make medical decisions. Therefore, DSS needs to grant consent for any procedures contemplated. The social worker also stated that he is not competent to make a decision regarding Code status although the patient states he  would want resuscitation.  We will plan to continue the patient's psychiatric medications.  Type 2 diabetes mellitus with hyperglycemia (HCC) Worsening blood sugars due to being on  Solu-Medrol plus underlying illness.  No recent A1c on chart so we will check.  Appreciate diabetes educator help.  Lantus added.  CBGs should improve as we are continue to trend his steroids down.  A1c at 6.6.  Paroxysmal atrial fibrillation (HCC) Patient in fib/flutter on EKG. Eliquis restarted 9/19 after 48 hours of IV heparin.  Beta-blocker restarted.  Digoxin added.  Seizure ([place]) Continue home meds. Seizure precautions  Coronary artery disease Patient with known CAD. S/P CABG. He reports that since being home from hospital 03/21/22 he has had increased chest pain - a heavy pressure in his chest. Troponins: 197 to 677. No acute changes on EKG but LBBB and pacer limit interpretation.  As per cardiology, patient has been on IV heparin for 48 hours.  HLD (hyperlipidemia) Continue statin therapy  Hypothyroidism Continue Synthroid  Overweight (BMI 25.0-29.9) Meets criteria BMI greater than 25        Pain control - [place] Controlled Substance Reporting System database was reviewed. and patient was instructed, not to drive, operate heavy machinery, perform activities at heights, swimming or participation in water activities or provide baby-sitting services while on Pain, Sleep and Anxiety Medications; until their outpatient Physician has advised to do so again. Also recommended to not to take more than prescribed Pain, Sleep and Anxiety Medications.   Consultants: Cardiology   Procedures: None  Disposition: Assisted living Diet recommendation:  Discharge Diet Orders (From admission, onward)     Start     Ordered   03/31/22 0000  Diet - low sodium heart healthy        03/31/22 1356           Cardiac and Carb modified diet DISCHARGE MEDICATION: Allergies as of 03/31/2022       Reactions   Iodinated Contrast Media Shortness Of Breath   Penicillins Anaphylaxis, Shortness Of Breath   Respiratory  Tolerated cefuroxime on 01/06/16   Strawberry Extract Anaphylaxis    Cefepime Itching   Empiric antibiotic, developed pruritis.    Erythromycin Itching   Sulfa Antibiotics Itching, Nausea And Vomiting, Nausea Only        Medication List     STOP taking these medications    atorvastatin 40 MG tablet Commonly known as: LIPITOR       TAKE these medications    acetaminophen 500 MG tablet Commonly known as: TYLENOL Take 500 mg by mouth every 6 (six) hours as needed for mild pain.   albuterol 108 (90 Base) MCG/ACT inhaler Commonly known as: VENTOLIN HFA Inhale 2 puffs into the lungs every 6 (six) hours as needed for wheezing.   albuterol (2.5 MG/3ML) 0.083% nebulizer solution Commonly known as: PROVENTIL Take 3 mLs (2.5 mg total) by nebulization every 6 (six) hours as needed for wheezing or shortness of breath.   carvedilol 3.125 MG tablet Commonly known as: COREG Take 3.125 mg by mouth 2 (two) times daily with a meal.   digoxin 0.125 MG tablet Commonly known as: LANOXIN Take 1 tablet (0.125 mg total) by mouth daily. Start taking on: April 01, 2022   divalproex 500 MG 24 hr tablet Commonly known as: DEPAKOTE ER Take 1 tablet (500 mg total) by mouth 2 (two) times daily.   Eliquis 5 MG Tabs tablet Generic drug: apixaban TAKE 1 TABLET BY MOUTH TWICE A DAY  escitalopram 10 MG tablet Commonly known as: LEXAPRO Take 1 tablet (10 mg total) by mouth at bedtime.   ezetimibe 10 MG tablet Commonly known as: ZETIA Take 1 tablet (10 mg total) by mouth daily. Start taking on: April 01, 2022   feeding supplement (GLUCERNA SHAKE) Liqd Take 237 mLs by mouth 3 (three) times daily between meals. What changed: when to take this   furosemide 40 MG tablet Commonly known as: LASIX Take 1 tablet (40 mg total) by mouth daily.   Hydrocortisone Acetate 1 % Crea SMARTSIG:sparingly Topical 3 Times Daily   hydrOXYzine 10 MG tablet Commonly known as: ATARAX Take 10 mg by mouth 2 (two) times daily.   levETIRAcetam 250 MG tablet Commonly  known as: Keppra Take 1 tablet (250 mg total) by mouth 2 (two) times daily.   levothyroxine 25 MCG tablet Commonly known as: SYNTHROID Take 1 tablet (25 mcg total) by mouth daily.   lisinopril 2.5 MG tablet Commonly known as: ZESTRIL TAKE 1 TABLET BY MOUTH ONCE DAILY   omeprazole 20 MG capsule Commonly known as: PRILOSEC Take 20 mg by mouth daily.   predniSONE 10 MG tablet Commonly known as: DELTASONE Take 1 tablet (10 mg total) by mouth daily with breakfast. Start taking on: April 02, 2022   QUEtiapine 200 MG tablet Commonly known as: SEROQUEL Take 200 mg by mouth at bedtime.   rosuvastatin 40 MG tablet Commonly known as: CRESTOR Take 40 mg by mouth daily.   tiotropium 18 MCG inhalation capsule Commonly known as: SPIRIVA Place 18 mcg into inhaler and inhale daily.   traMADol 50 MG tablet Commonly known as: ULTRAM Take 1 tablet (50 mg total) by mouth 2 (two) times daily as needed.        Discharge Exam: Filed Weights   03/22/22 1100 03/25/22 0500  Weight: 85.5 kg 82.7 kg   General: Alert and oriented x2, no acute distress Cardiovascular: Regular rate and rhythm, S1-S2 Lungs: Clear to auscultation bilaterally  Condition at discharge: good  The results of significant diagnostics from this hospitalization (including imaging, microbiology, ancillary and laboratory) are listed below for reference.   Imaging Studies: DG Chest Portable 1 View  Result Date: 03/22/2022 CLINICAL DATA:  Chest pain worsening shortness of breath. EXAM: PORTABLE CHEST 1 VIEW COMPARISON:  Earlier same day FINDINGS: The cardio pericardial silhouette is enlarged. There is pulmonary vascular congestion without overt pulmonary edema. Interstitial markings are diffusely coarsened with chronic features. Left permanent pacemaker noted. Telemetry leads overlie the chest. IMPRESSION: Enlargement of the cardiopericardial silhouette with pulmonary vascular congestion. No interval change since prior  study. Electronically Signed   By: Misty Stanley M.D.   On: 03/22/2022 07:48   DG Chest Portable 1 View  Result Date: 03/22/2022 CLINICAL DATA:  Shortness of breath. EXAM: PORTABLE CHEST 1 VIEW COMPARISON:  03/18/2022 FINDINGS: The cardio pericardial silhouette is enlarged. There is pulmonary vascular congestion without overt pulmonary edema. Insert no consolidation insert no effusion Interstitial markings are diffusely coarsened with chronic features. Left permanent pacemaker noted. Status post left palpable surgery. Telemetry leads overlie the chest. IMPRESSION: Enlargement of the cardiopericardial silhouette with pulmonary vascular congestion. Electronically Signed   By: Misty Stanley M.D.   On: 03/22/2022 07:47   DG Chest Port 1 View  Result Date: 03/18/2022 CLINICAL DATA:  Shortness of breath EXAM: PORTABLE CHEST 1 VIEW COMPARISON:  Previous studies including the examination of 03/14/2022 FINDINGS: Transverse diameter of heart is increased. Central pulmonary vessels are more prominent. There is no  focal pulmonary consolidation. There is no pleural effusion or pneumothorax. 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 Pacemaker/defibrillator battery is seen in the left infraclavicular region. Biventricular pacer leads are noted in place. There is previous internal fixation in left clavicle. IMPRESSION: Cardiomegaly. Central pulmonary vessels are more prominent suggesting CHF. There is no focal pulmonary consolidation. Electronically Signed   By: Elmer Picker M.D.   On: 03/18/2022 16:22   CT Abdomen Pelvis Wo Contrast  Result Date: 03/14/2022 CLINICAL DATA:  Abdominal pain. EXAM: CT ABDOMEN AND PELVIS WITHOUT CONTRAST TECHNIQUE: Multidetector CT imaging of the abdomen and pelvis was performed following the standard protocol without IV contrast. RADIATION DOSE REDUCTION: This exam was performed according to the departmental dose-optimization program which includes automated exposure control, adjustment of  the mA and/or kV according to patient size and/or use of iterative reconstruction technique. COMPARISON:  Abdominal ultrasound dated 02/16/2022. FINDINGS: Evaluation of this exam is limited in the absence of intravenous contrast. Lower chest: The visualized lung bases are clear. There is cardiomegaly. Mechanical mitral bowel. There is coronary vascular calcification. Cardiac pacemaker wires noted. No intra-abdominal free air or free fluid. Hepatobiliary: No focal liver abnormality is seen. No gallstones, gallbladder wall thickening, or biliary dilatation. Pancreas: Unremarkable. No pancreatic ductal dilatation or surrounding inflammatory changes. Spleen: Normal in size without focal abnormality. Adrenals/Urinary Tract: The adrenal glands are unremarkable. Probable punctate nonobstructing right renal calculi. No hydronephrosis. There is no hydronephrosis or nephrolithiasis on the left. There is a 1.3 cm partially exophytic hypodense lesion from the posterior upper pole of the left kidney which is not characterized on this noncontrast CT but demonstrates fluid attenuation, likely a cyst. The visualized ureters and urinary bladder appear unremarkable. Stomach/Bowel: There is no bowel obstruction or active inflammation. Several scattered colonic diverticula without active inflammatory changes. The appendix is normal. Vascular/Lymphatic: Advanced aortoiliac atherosclerotic disease. There is a 3.1 cm infrarenal abdominal aortic aneurysm. The IVC is unremarkable. No portal venous gas. There is no adenopathy. Reproductive: The prostate and seminal vesicles are grossly unremarkable. No pelvic mass. Other: None Musculoskeletal: No acute or significant osseous findings. IMPRESSION: 1. No acute intra-abdominal or pelvic pathology. 2. Colonic diverticulosis. No bowel obstruction. Normal appendix. 3. A 3.1 cm infrarenal abdominal aortic aneurysm. Recommend follow-up ultrasound every 3 years. This recommendation follows ACR  consensus guidelines: White Paper of the ACR Incidental Findings Committee II on Vascular Findings. J Am Coll Radiol 2013JB:6262728. 4. Aortic Atherosclerosis (ICD10-I70.0). Electronically Signed   By: Anner Crete M.D.   On: 03/14/2022 21:19   CT Head Wo Contrast  Result Date: 03/14/2022 CLINICAL DATA:  Mental status change, unknown cause EXAM: CT HEAD WITHOUT CONTRAST TECHNIQUE: Contiguous axial images were obtained from the base of the skull through the vertex without intravenous contrast. RADIATION DOSE REDUCTION: This exam was performed according to the departmental dose-optimization program which includes automated exposure control, adjustment of the mA and/or kV according to patient size and/or use of iterative reconstruction technique. COMPARISON:  CT head 05/22/2021 BRAIN: BRAIN Patchy and confluent areas of decreased attenuation are noted throughout the deep and periventricular white matter of the cerebral hemispheres bilaterally, compatible with chronic microvascular ischemic disease. Redemonstration of left parieto-occipital and temporal encephalomalacia consistent with prior left middle cerebral artery infarction. No evidence of large-territorial acute infarction. No parenchymal hemorrhage. No mass lesion. No extra-axial collection. No mass effect or midline shift. No hydrocephalus. Basilar cisterns are patent. Vascular: No hyperdense vessel. Atherosclerotic calcifications are present within the cavernous internal carotid arteries. Skull: No acute  fracture or focal lesion. Sinuses/Orbits: Complete opacification of the left maxillary sinus. Paranasal sinuses and mastoid air cells are clear. The orbits are unremarkable. Other: None. IMPRESSION: No acute intracranial abnormality. Electronically Signed   By: Iven Finn M.D.   On: 03/14/2022 19:18   DG Chest 2 View  Result Date: 03/14/2022 CLINICAL DATA:  Shortness of breath. EXAM: CHEST - 2 VIEW COMPARISON:  02/15/2022 radiograph and CT  FINDINGS: Stable cardiomegaly post median sternotomy. Prosthetic cardiac valve. Left-sided pacemaker in place. Mild chronic peribronchial thickening without focal airspace disease. No pleural effusion or pneumothorax. Left clavicle hardware. IMPRESSION: Stable cardiomegaly post median sternotomy and cardiac valve replacement. No acute chest finding. Electronically Signed   By: Keith Rake M.D.   On: 03/14/2022 19:13    Microbiology: Results for orders placed or performed during the hospital encounter of 03/22/22  Culture, blood (routine x 2)     Status: None   Collection Time: 03/22/22 10:38 AM   Specimen: BLOOD  Result Value Ref Range Status   Specimen Description BLOOD LEFT ANTECUBITAL  Final   Special Requests   Final    BOTTLES DRAWN AEROBIC AND ANAEROBIC Blood Culture results may not be optimal due to an excessive volume of blood received in culture bottles   Culture   Final    NO GROWTH 8 DAYS Performed at [facility], [address] [place], [place] [address]    Report Status 03/30/2022 FINAL  Final  SARS Coronavirus 2 by RT PCR (hospital order, performed in [place] hospital lab) *cepheid single result test* Anterior Nasal Swab     Status: Abnormal   Collection Time: 03/22/22  4:55 PM   Specimen: Anterior Nasal Swab  Result Value Ref Range Status   SARS Coronavirus 2 by RT PCR POSITIVE (A) NEGATIVE Final    Comment: (NOTE) SARS-CoV-2 target nucleic acids are DETECTED  SARS-CoV-2 RNA is generally detectable in upper respiratory specimens  during the acute phase of infection.  Positive results are indicative  of the presence of the identified virus, but do not rule out bacterial infection or co-infection with other pathogens not detected by the test.  Clinical correlation with patient history and  other diagnostic information is necessary to determine patient infection status.  The expected result is negative.  Fact Sheet for Patients:    https://www.patel.info/   Fact Sheet for Healthcare Providers:   https://hall.com/    This test is not yet approved or cleared by the [place] FDA and  has been authorized for detection and/or diagnosis of SARS-CoV-2 by FDA under an Emergency Use Authorization (EUA).  This EUA will remain in effect (meaning this test can be used) for the duration of  the COVID-19 declaration under Section 564(b)(1)  of the Act, 21 U.S.C. section 360-bbb-3(b)(1), unless the authorization is terminated or revoked sooner.   Performed at [facility], [address] [place], [place] [address]   Culture, blood (routine x 2)     Status: None   Collection Time: 03/23/22  4:58 AM   Specimen: BLOOD  Result Value Ref Range Status   Specimen Description BLOOD BLOOD RIGHT HAND  Final   Special Requests   Final    BOTTLES DRAWN AEROBIC AND ANAEROBIC Blood Culture adequate volume   Culture   Final    NO GROWTH 5 DAYS Performed at [facility], [address] [place], [place] [address]    Report Status 03/28/2022 FINAL  Final    Labs: CBC:  Recent Labs  Lab 03/25/22 0700 03/26/22 0648 03/28/22 0728 03/29/22 0813  WBC 14.6* 14.7* 16.0* 17.5*  HGB 13.0 12.4* 12.1* 13.8  HCT 39.7 37.2* 35.9* 40.4  MCV 87.6 86.5 85.3 86.5  PLT 175 172 149* [id number]   Basic Metabolic Panel: Recent Labs  Lab 03/26/22 0648 03/28/22 0728 03/29/22 0813 03/30/22 0653 03/31/22 0532  NA 136 133* 135 131* 135  K 4.6 4.8 5.0 4.7 4.6  CL 101 98 100 96* 99  CO2 28 28 27 24 28   GLUCOSE 115* 124* 90 105* 91  BUN 42* 39* 45* 52* 59*  CREATININE 1.09 1.10 1.18 1.20 1.40*  CALCIUM 8.8* 9.5 9.9 9.5 9.5   Liver Function Tests: No results for input(s): "AST", "ALT", "ALKPHOS", "BILITOT", "PROT", "ALBUMIN" in the last 168 hours. CBG: Recent Labs  Lab 03/30/22 0811 03/30/22 1516 03/30/22 1728 03/30/22 2247 03/31/22 0902  GLUCAP 133* 258* 212* 113* 129*     Discharge time spent: less than 30 minutes.  Signed: Annita Brod, MD [facility] 03/31/2022

## 2022-04-01 DIAGNOSIS — R778 Other specified abnormalities of plasma proteins: Secondary | ICD-10-CM | POA: Diagnosis not present

## 2022-04-01 LAB — GLUCOSE, CAPILLARY
Glucose-Capillary: 142 mg/dL — ABNORMAL HIGH (ref 70–99)
Glucose-Capillary: 182 mg/dL — ABNORMAL HIGH (ref 70–99)

## 2022-04-01 NOTE — TOC Transition Note (Signed)
Transition of Care [facility]) - CM/SW Discharge Note   Patient Details  Name: Carl [name] MRN: [id number] Date of Birth: 1960-08-22  [facility] CM/SW Contact:  Candie Chroman, LCSW Phone Number: 04/01/2022, 12:26 PM   Clinical Narrative:   Patient has orders to discharge back to [facility] today. [place] liaison is aware. Faxed FL2 and discharge summary to ALF. Sent ALF owner's phone number to RN so she can call and coordinate pickup time. No further concerns. CSW signing off.  Final next level of care: Assisted Living (with home health) Barriers to Discharge: Barriers Resolved   Patient Goals and CMS Choice        Discharge Placement                Patient to be transferred to facility by: ALF Transport Name of family member notified: [name] and Toula Moos (DSS) Patient and family notified of of transfer: 04/01/22  Discharge Plan and Services   Discharge Planning Services: CM Consult                      HH Arranged: RN, PT [facility] Agency: [place] Date [place]: 04/01/22   Representative spoke with at [place]: [place] ([place]) Interventions     Readmission Risk Interventions    03/15/2022    2:44 PM  Readmission Risk Prevention Plan  HRI or [place] Complete  Social Work Consult for [place] Planning/Counseling Complete  Palliative Care Screening Not Applicable  Medication Review [employer]) Complete

## 2022-04-01 NOTE — NC FL2 (Addendum)
[place] LEVEL OF CARE SCREENING TOOL     IDENTIFICATION  Patient Name: Carl [name] Birthdate: 06-Feb-1961 Sex: male Admission Date (Current Location): 03/22/2022  [facility] and [place] Number:  Engineering geologist and Address:  [facility], [address], [place], [place] [address]      Provider Number: [id number]  Attending Physician Name and Address:  Emeterio Reeve, DO  Relative Name and Phone Number:       Current Level of Care: Hospital Recommended Level of Care: [place] with home health RN and PT through [facility]. Prior Approval Number:    Date Approved/Denied:   PASRR Number:    Discharge Plan: Other (Comment) (ALF) with home health RN and PT through [place].    Current Diagnoses: Patient Active Problem List   Diagnosis Date Noted   Demand ischemia ([place])    Type 2 diabetes mellitus with hyperglycemia ([place]) 03/25/2022   Overweight (BMI 25.0-29.9) 03/23/2022   COVID-19 virus infection 03/23/2022   Respiratory failure ([place]) 03/22/2022   Dilated cardiomyopathy (HCC)    Chest pain    COPD exacerbation ([place]) 03/14/2022   Seizure disorder ([place]) 03/14/2022   Dyspnea    Congestive heart failure ([place]) 02/15/2022   Anxiety 02/15/2022   Tobacco use 02/15/2022   Elevated partial thromboplastin time (PTT) 02/15/2022   Abdominal pain 02/15/2022   HFrEF (heart failure with reduced ejection fraction) (HCC)    Weakness    COPD with acute exacerbation ([place]) 05/23/2021   Paroxysmal atrial fibrillation (HCC) 05/23/2021   Chronic anticoagulation 05/23/2021   Abdominal aortic aneurysm (AAA) 35 to 39 mm in diameter ([place]) 05/23/2021   Ischemic cardiomyopathy [id number]   Chronic systolic heart failure ([place]) 05/22/2020   HCAP (healthcare-associated pneumonia) 01/24/2020   Hyperkalemia 01/24/2020   Acute on chronic combined systolic (congestive) and diastolic (congestive) heart failure ([place]) 01/24/2020   Seizure  ([place]) 01/24/2020   HLD (hyperlipidemia) 01/24/2020   Ascites due to alcoholic cirrhosis (HCC)    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/18/2019   Elevated troponin 11/18/2019   Hypoxia 11/18/2019   Coronary artery disease 05/15/2019   History of stroke 05/15/2019   Homicidal ideation 05/15/2019   Hypothyroidism 05/15/2019   Constipation 05/06/2019   Scrotal mass 05/06/2019   Bipolar 1 disorder ([place]) 12/23/2018   Chronic pain of left knee 09/17/2018   Atypical chest pain 10/11/2017   Polypharmacy 08/05/2017   Cerebrovascular accident (CVA) due to stenosis of left middle cerebral artery ([place]) 06/26/2017   Urinary retention due to benign prostatic hyperplasia 06/22/2017   Falls 06/20/2017   Risk for falls 02/19/2017   Dyslipidemia 11/19/2016   Peripheral artery disease ([place]) 08/28/2016   Claudication ([place]) 08/09/2016   History of alcohol abuse 08/09/2016   History of drug dependence/abuse ([place]) 08/09/2016   Severe single current episode of major depressive disorder, without psychotic features ([place]) 08/09/2016   Mitral insufficiency 01/04/2016   Prediabetes 12/29/2015   Fracture of left clavicle 07/13/2012    Orientation RESPIRATION BLADDER Height & Weight     Self, Place  Normal Incontinent Weight: 182 lb 5.1 oz (82.7 kg) Height:  5\' 9"  (175.3 cm)  BEHAVIORAL SYMPTOMS/MOOD NEUROLOGICAL BOWEL NUTRITION STATUS      Continent Diet (Low sodium, heart healthy)  AMBULATORY STATUS COMMUNICATION OF NEEDS Skin     Verbally Bruising                       Personal Care Assistance Level of Assistance  Functional Limitations Info  Sight, Hearing, Speech Sight Info: Adequate Hearing Info: Adequate Speech Info: Adequate    SPECIAL CARE FACTORS FREQUENCY                       Contractures Contractures Info: Not present    Additional Factors Info  Code Status, Allergies Code Status Info: Full code Allergies Info: Iodinated Contrast  Media, Penicillins, Strawberry Extract, Cefepime, Erythromycin, Sulfa Antibiotics           Current Medications (04/01/2022):  This is the current hospital active medication list Current Facility-Administered Medications  Medication Dose Route Frequency Provider Last Rate Last Admin   acetaminophen (TYLENOL) tablet 500 mg  500 mg Oral Q6H PRN Norins, Heinz Knuckles, MD       albuterol (PROVENTIL) (2.5 MG/3ML) 0.083% nebulizer solution 2.5 mg  2.5 mg Nebulization Q6H PRN Norins, Heinz Knuckles, MD   2.5 mg at 03/28/22 1320   apixaban (ELIQUIS) tablet 5 mg  5 mg Oral BID Annita Brod, MD   5 mg at 04/01/22 0943   carvedilol (COREG) tablet 3.125 mg  3.125 mg Oral BID WC Annita Brod, MD   3.125 mg at 04/01/22 0272   digoxin (LANOXIN) tablet 0.125 mg  0.125 mg Oral Daily Minna Merritts, MD   0.125 mg at 04/01/22 0942   divalproex (DEPAKOTE ER) 24 hr tablet 500 mg  500 mg Oral BID Neena Rhymes, MD   500 mg at 04/01/22 0941   escitalopram (LEXAPRO) tablet 10 mg  10 mg Oral QHS Neena Rhymes, MD   10 mg at 03/31/22 2138   ezetimibe (ZETIA) tablet 10 mg  10 mg Oral Daily Annita Brod, MD   10 mg at 04/01/22 0943   feeding supplement (GLUCERNA SHAKE) (GLUCERNA SHAKE) liquid 237 mL  237 mL Oral TID BM Annita Brod, MD   237 mL at 04/01/22 0943   furosemide (LASIX) tablet 40 mg  40 mg Oral Daily End, Christopher, MD   40 mg at 04/01/22 0943   hydrOXYzine (ATARAX) tablet 10 mg  10 mg Oral BID Neena Rhymes, MD   10 mg at 04/01/22 0941   insulin aspart (novoLOG) injection 0-15 Units  0-15 Units Subcutaneous TID WC Annita Brod, MD   3 Units at 04/01/22 1142   insulin aspart (novoLOG) injection 0-5 Units  0-5 Units Subcutaneous QHS Annita Brod, MD   3 Units at 03/28/22 2147   insulin glargine-yfgn (SEMGLEE) injection 8 Units  8 Units Subcutaneous QHS Emeterio Reeve, DO   8 Units at 03/31/22 2138   levETIRAcetam (KEPPRA) tablet 250 mg  250 mg Oral BID Neena Rhymes, MD   250 mg at 04/01/22 0941   levothyroxine (SYNTHROID) tablet 25 mcg  25 mcg Oral Daily Neena Rhymes, MD   25 mcg at 04/01/22 5366   multivitamin with minerals tablet 1 tablet  1 tablet Oral Daily Annita Brod, MD   1 tablet at 04/01/22 4403   Oral care mouth rinse  15 mL Mouth Rinse 4 times per day Annita Brod, MD   15 mL at 04/01/22 1146   Oral care mouth rinse  15 mL Mouth Rinse PRN Annita Brod, MD       pantoprazole (PROTONIX) EC tablet 80 mg  80 mg Oral Daily Neena Rhymes, MD   80 mg at 04/01/22 0943   [START ON 04/02/2022] predniSONE (DELTASONE) tablet 10 mg  10 mg Oral Q breakfast Annita Brod, MD       QUEtiapine (SEROQUEL) tablet 200 mg  200 mg Oral QHS Norins, Heinz Knuckles, MD   200 mg at 03/31/22 2138   rosuvastatin (CRESTOR) tablet 40 mg  40 mg Oral Daily Annita Brod, MD   40 mg at 04/01/22 0943   tiotropium [facility]) inhalation capsule ([facility] use ONLY) 18 mcg  18 mcg Inhalation Daily Norins, Heinz Knuckles, MD   18 mcg at 04/01/22 0900   traMADol (ULTRAM) tablet 50 mg  50 mg Oral Q12H PRN Neena Rhymes, MD   50 mg at 03/26/22 1102     Discharge Medications: STOP taking these medications     atorvastatin 40 MG tablet Commonly known as: LIPITOR           TAKE these medications     acetaminophen 500 MG tablet Commonly known as: TYLENOL Take 500 mg by mouth every 6 (six) hours as needed for mild pain.    albuterol 108 (90 Base) MCG/ACT inhaler Commonly known as: VENTOLIN HFA Inhale 2 puffs into the lungs every 6 (six) hours as needed for wheezing.    albuterol (2.5 MG/3ML) 0.083% nebulizer solution Commonly known as: PROVENTIL Take 3 mLs (2.5 mg total) by nebulization every 6 (six) hours as needed for wheezing or shortness of breath.    carvedilol 3.125 MG tablet Commonly known as: COREG Take 3.125 mg by mouth 2 (two) times daily with a meal.    digoxin 0.125 MG tablet Commonly known as: LANOXIN Take 1 tablet (0.125  mg total) by mouth daily.    divalproex 500 MG 24 hr tablet Commonly known as: DEPAKOTE ER Take 1 tablet (500 mg total) by mouth 2 (two) times daily.    Eliquis 5 MG Tabs tablet Generic drug: apixaban TAKE 1 TABLET BY MOUTH TWICE A DAY    escitalopram 10 MG tablet Commonly known as: LEXAPRO Take 1 tablet (10 mg total) by mouth at bedtime.    ezetimibe 10 MG tablet Commonly known as: ZETIA Take 1 tablet (10 mg total) by mouth daily.    feeding supplement (GLUCERNA SHAKE) Liqd Take 237 mLs by mouth 3 (three) times daily between meals. What changed: when to take this    furosemide 40 MG tablet Commonly known as: LASIX Take 1 tablet (40 mg total) by mouth daily.    Hydrocortisone Acetate 1 % Crea SMARTSIG:sparingly Topical 3 Times Daily    hydrOXYzine 10 MG tablet Commonly known as: ATARAX Take 10 mg by mouth 2 (two) times daily.    levETIRAcetam 250 MG tablet Commonly known as: Keppra Take 1 tablet (250 mg total) by mouth 2 (two) times daily.    levothyroxine 25 MCG tablet Commonly known as: SYNTHROID Take 1 tablet (25 mcg total) by mouth daily.    lisinopril 2.5 MG tablet Commonly known as: ZESTRIL TAKE 1 TABLET BY MOUTH ONCE DAILY    omeprazole 20 MG capsule Commonly known as: PRILOSEC Take 20 mg by mouth daily.    predniSONE 10 MG tablet Commonly known as: DELTASONE Take 1 tablet (10 mg total) by mouth daily with breakfast. Start taking on: April 02, 2022    QUEtiapine 200 MG tablet Commonly known as: SEROQUEL Take 200 mg by mouth at bedtime.    rosuvastatin 40 MG tablet Commonly known as: CRESTOR Take 40 mg by mouth daily.    tiotropium 18 MCG inhalation capsule Commonly known as: SPIRIVA Place 18 mcg into inhaler and inhale  daily.    traMADol 50 MG tablet Commonly known as: ULTRAM Take 1 tablet (50 mg total) by mouth 2 (two) times daily as needed.    Relevant Imaging Results:  Relevant Lab Results:   Additional [place], LCSW

## 2022-04-01 NOTE — Discharge Summary (Signed)
Physician Discharge Summary   Patient: Carl [name] MRN: [id number] DOB: 1960-07-28  Admit date:     03/22/2022  Anticipated discharge date: 04/01/2022  Discharge Physician: Emeterio Reeve   PCP: [facility]   Recommendations at discharge:   New medication: Digoxin 0.125 p.o. daily New medication: Prednisone taper x2 more days Patient being discharged back to his assisted living facility.  Discharge Diagnoses: Principal Problem:   Elevated troponin Active Problems:   COPD exacerbation (HCC)   COVID-19 virus infection   Bipolar 1 disorder (HCC)   Paroxysmal atrial fibrillation (HCC)   Type 2 diabetes mellitus with hyperglycemia (HCC)   Coronary artery disease   Seizure (HCC)   HLD (hyperlipidemia)   Hypothyroidism   Overweight (BMI 25.0-29.9)   Demand ischemia (HCC)  Resolved Problems:   Acute respiratory failure with hypoxia (HCC)   Acute on chronic HFrEF (heart failure with reduced ejection fraction) (HCC)   Hypotension   Acute renal failure superimposed on stage 3a chronic kidney disease [facility])  Hospital Course: 61 year old male with past medical history of bipolar disorder, chronic systolic heart failure with ejection fraction of 25%, CAD status post non-STEMI, CVA, seizure disorder and COPD with recent hospitalization for CHF and COPD exacerbations who presented to the emergency room on 9/15 with shortness of breath and chest pain and found to have hypoxia.  Patient was admitted for CHF exacerbation.  Also noted to have elevated troponins and cardiology consulted.  After patient admitted, COVID PCR came back positive.  Patient started on IV Remdisivir and IV steroids.  Over the next few days, patient's oxygenation has improved.  Initially was on BiPAP and has been able to be weaned off of oxygen to room air.  Has diuresed over 10 L.  Required to stay in the hospital cannot return to ALF until 9/25, 10 days post positive COVID.  Assessment and Plan: *  Elevated troponin Felt to more demand ischemia in the setting of respiratory issues, heart failure COVID.  Cardiology following.  Acute respiratory failure with hypoxia (HCC)-resolved as of 03/28/2022 Multifactorial felt to be secondary to COVID, CHF and COPD.  Able to be weaned off of oxygen altogether.  Acute on chronic HFrEF (heart failure with reduced ejection fraction) (HCC)-resolved as of 03/31/2022 Last echo with EF 23%. Patient presents with increased SOB with rales on exam and rising BNP.  Responding to IV Lasix and has diuresed over 10 L and is more than -8.5 L deficient.  Now changed over to p.o. diuretic  COPD exacerbation (HCC) Continue nebulizers, tapering steroids  COVID-19 virus infection Given hypoxia, started on Remdisivir and Solu-Medrol.  CRP level trending down.  Remdisivir completed 9/18.  Solu-Medrol changed over to p.o. prednisone for quick taper.  Although patient has improved, he will need to stay here until Monday, 9/25, where he will complete a 10-day isolation for COVID  Hypotension-resolved as of 03/26/2022 Had some issues with hypotension, possibly from diuresis.  Since resolved.  Acute renal failure superimposed on stage 3a chronic kidney disease (HCC)-resolved as of 03/26/2022 Baseline around 1.1.  Creatinine briefly rose, then improved, despite diuresis.  Resolved by 9/19  Bipolar 1 disorder [facility]) Patient with psych issues. Has also suffered multifactorial encephalopathy. Social Worker states that he is under guardianship and has been deemed incompetent to make medical decisions. Therefore, DSS needs to grant consent for any procedures contemplated. The social worker also stated that he is not competent to make a decision regarding Code status although the patient states he would  want resuscitation.  We will plan to continue the patient's psychiatric medications.  Type 2 diabetes mellitus with hyperglycemia (HCC) Worsening blood sugars due to being on  Solu-Medrol plus underlying illness.  No recent A1c on chart so we will check.  Appreciate diabetes educator help.  Lantus added.  CBGs should improve as we are continue to trend his steroids down.  A1c at 6.6.  Paroxysmal atrial fibrillation (HCC) Patient in fib/flutter on EKG. Eliquis restarted 9/19 after 48 hours of IV heparin.  Beta-blocker restarted.  Digoxin added.  Seizure ([place]) Continue home meds. Seizure precautions  Coronary artery disease Patient with known CAD. S/P CABG. He reports that since being home from hospital 03/21/22 he has had increased chest pain - a heavy pressure in his chest. Troponins: 197 to 677. No acute changes on EKG but LBBB and pacer limit interpretation.  As per cardiology, patient has been on IV heparin for 48 hours.  HLD (hyperlipidemia) Continue statin therapy  Hypothyroidism Continue Synthroid  Overweight (BMI 25.0-29.9) Meets criteria BMI greater than 25        Pain control - [place] Controlled Substance Reporting System database was reviewed. and patient was instructed, not to drive, operate heavy machinery, perform activities at heights, swimming or participation in water activities or provide baby-sitting services while on Pain, Sleep and Anxiety Medications; until their outpatient Physician has advised to do so again. Also recommended to not to take more than prescribed Pain, Sleep and Anxiety Medications.   Consultants: Cardiology   Procedures: None  Disposition: Assisted living Diet recommendation:  Discharge Diet Orders (From admission, onward)     Start     Ordered   03/31/22 0000  Diet - low sodium heart healthy        03/31/22 1356           Cardiac and Carb modified diet DISCHARGE MEDICATION: Allergies as of 04/01/2022       Reactions   Iodinated Contrast Media Shortness Of Breath   Penicillins Anaphylaxis, Shortness Of Breath   Respiratory  Tolerated cefuroxime on 01/06/16   Strawberry Extract Anaphylaxis    Cefepime Itching   Empiric antibiotic, developed pruritis.    Erythromycin Itching   Sulfa Antibiotics Itching, Nausea And Vomiting, Nausea Only        Medication List     STOP taking these medications    atorvastatin 40 MG tablet Commonly known as: LIPITOR       TAKE these medications    acetaminophen 500 MG tablet Commonly known as: TYLENOL Take 500 mg by mouth every 6 (six) hours as needed for mild pain.   albuterol 108 (90 Base) MCG/ACT inhaler Commonly known as: VENTOLIN HFA Inhale 2 puffs into the lungs every 6 (six) hours as needed for wheezing.   albuterol (2.5 MG/3ML) 0.083% nebulizer solution Commonly known as: PROVENTIL Take 3 mLs (2.5 mg total) by nebulization every 6 (six) hours as needed for wheezing or shortness of breath.   carvedilol 3.125 MG tablet Commonly known as: COREG Take 3.125 mg by mouth 2 (two) times daily with a meal.   digoxin 0.125 MG tablet Commonly known as: LANOXIN Take 1 tablet (0.125 mg total) by mouth daily.   divalproex 500 MG 24 hr tablet Commonly known as: DEPAKOTE ER Take 1 tablet (500 mg total) by mouth 2 (two) times daily.   Eliquis 5 MG Tabs tablet Generic drug: apixaban TAKE 1 TABLET BY MOUTH TWICE A DAY   escitalopram 10 MG tablet Commonly known  asLoma Sousa Take 1 tablet (10 mg total) by mouth at bedtime.   ezetimibe 10 MG tablet Commonly known as: ZETIA Take 1 tablet (10 mg total) by mouth daily.   feeding supplement (GLUCERNA SHAKE) Liqd Take 237 mLs by mouth 3 (three) times daily between meals. What changed: when to take this   furosemide 40 MG tablet Commonly known as: LASIX Take 1 tablet (40 mg total) by mouth daily.   Hydrocortisone Acetate 1 % Crea SMARTSIG:sparingly Topical 3 Times Daily   hydrOXYzine 10 MG tablet Commonly known as: ATARAX Take 10 mg by mouth 2 (two) times daily.   levETIRAcetam 250 MG tablet Commonly known as: Keppra Take 1 tablet (250 mg total) by mouth 2 (two) times  daily.   levothyroxine 25 MCG tablet Commonly known as: SYNTHROID Take 1 tablet (25 mcg total) by mouth daily.   lisinopril 2.5 MG tablet Commonly known as: ZESTRIL TAKE 1 TABLET BY MOUTH ONCE DAILY   omeprazole 20 MG capsule Commonly known as: PRILOSEC Take 20 mg by mouth daily.   predniSONE 10 MG tablet Commonly known as: DELTASONE Take 1 tablet (10 mg total) by mouth daily with breakfast. Start taking on: April 02, 2022   QUEtiapine 200 MG tablet Commonly known as: SEROQUEL Take 200 mg by mouth at bedtime.   rosuvastatin 40 MG tablet Commonly known as: CRESTOR Take 40 mg by mouth daily.   tiotropium 18 MCG inhalation capsule Commonly known as: SPIRIVA Place 18 mcg into inhaler and inhale daily.   traMADol 50 MG tablet Commonly known as: ULTRAM Take 1 tablet (50 mg total) by mouth 2 (two) times daily as needed.        Discharge Exam: Filed Weights   03/22/22 1100 03/25/22 0500  Weight: 85.5 kg 82.7 kg   General: Alert and oriented x2, no acute distress Cardiovascular: Regular rate and rhythm, S1-S2 Lungs: Clear to auscultation bilaterally  Condition at discharge: good  The results of significant diagnostics from this hospitalization (including imaging, microbiology, ancillary and laboratory) are listed below for reference.   Imaging Studies: DG Chest Portable 1 View  Result Date: 03/22/2022 CLINICAL DATA:  Chest pain worsening shortness of breath. EXAM: PORTABLE CHEST 1 VIEW COMPARISON:  Earlier same day FINDINGS: The cardio pericardial silhouette is enlarged. There is pulmonary vascular congestion without overt pulmonary edema. Interstitial markings are diffusely coarsened with chronic features. Left permanent pacemaker noted. Telemetry leads overlie the chest. IMPRESSION: Enlargement of the cardiopericardial silhouette with pulmonary vascular congestion. No interval change since prior study. Electronically Signed   By: Misty Stanley M.D.   On: 03/22/2022  07:48   DG Chest Portable 1 View  Result Date: 03/22/2022 CLINICAL DATA:  Shortness of breath. EXAM: PORTABLE CHEST 1 VIEW COMPARISON:  03/18/2022 FINDINGS: The cardio pericardial silhouette is enlarged. There is pulmonary vascular congestion without overt pulmonary edema. Insert no consolidation insert no effusion Interstitial markings are diffusely coarsened with chronic features. Left permanent pacemaker noted. Status post left palpable surgery. Telemetry leads overlie the chest. IMPRESSION: Enlargement of the cardiopericardial silhouette with pulmonary vascular congestion. Electronically Signed   By: Misty Stanley M.D.   On: 03/22/2022 07:47   DG Chest Port 1 View  Result Date: 03/18/2022 CLINICAL DATA:  Shortness of breath EXAM: PORTABLE CHEST 1 VIEW COMPARISON:  Previous studies including the examination of 03/14/2022 FINDINGS: Transverse diameter of heart is increased. Central pulmonary vessels are more prominent. There is no focal pulmonary consolidation. There is no pleural effusion or pneumothorax. There is  prosthetic mitral valve. Pacemaker/defibrillator battery is seen in the left infraclavicular region. Biventricular pacer leads are noted in place. There is previous internal fixation in left clavicle. IMPRESSION: Cardiomegaly. Central pulmonary vessels are more prominent suggesting CHF. There is no focal pulmonary consolidation. Electronically Signed   By: Ernie Avena M.D.   On: 03/18/2022 16:22   CT Abdomen Pelvis Wo Contrast  Result Date: 03/14/2022 CLINICAL DATA:  Abdominal pain. EXAM: CT ABDOMEN AND PELVIS WITHOUT CONTRAST TECHNIQUE: Multidetector CT imaging of the abdomen and pelvis was performed following the standard protocol without IV contrast. RADIATION DOSE REDUCTION: This exam was performed according to the departmental dose-optimization program which includes automated exposure control, adjustment of the mA and/or kV according to patient size and/or use of iterative  reconstruction technique. COMPARISON:  Abdominal ultrasound dated 02/16/2022. FINDINGS: Evaluation of this exam is limited in the absence of intravenous contrast. Lower chest: The visualized lung bases are clear. There is cardiomegaly. Mechanical mitral bowel. There is coronary vascular calcification. Cardiac pacemaker wires noted. No intra-abdominal free air or free fluid. Hepatobiliary: No focal liver abnormality is seen. No gallstones, gallbladder wall thickening, or biliary dilatation. Pancreas: Unremarkable. No pancreatic ductal dilatation or surrounding inflammatory changes. Spleen: Normal in size without focal abnormality. Adrenals/Urinary Tract: The adrenal glands are unremarkable. Probable punctate nonobstructing right renal calculi. No hydronephrosis. There is no hydronephrosis or nephrolithiasis on the left. There is a 1.3 cm partially exophytic hypodense lesion from the posterior upper pole of the left kidney which is not characterized on this noncontrast CT but demonstrates fluid attenuation, likely a cyst. The visualized ureters and urinary bladder appear unremarkable. Stomach/Bowel: There is no bowel obstruction or active inflammation. Several scattered colonic diverticula without active inflammatory changes. The appendix is normal. Vascular/Lymphatic: Advanced aortoiliac atherosclerotic disease. There is a 3.1 cm infrarenal abdominal aortic aneurysm. The IVC is unremarkable. No portal venous gas. There is no adenopathy. Reproductive: The prostate and seminal vesicles are grossly unremarkable. No pelvic mass. Other: None Musculoskeletal: No acute or significant osseous findings. IMPRESSION: 1. No acute intra-abdominal or pelvic pathology. 2. Colonic diverticulosis. No bowel obstruction. Normal appendix. 3. A 3.1 cm infrarenal abdominal aortic aneurysm. Recommend follow-up ultrasound every 3 years. This recommendation follows ACR consensus guidelines: White Paper of the ACR Incidental Findings  Committee II on Vascular Findings. J Am Coll Radiol 2013; 61:518-343. 4. Aortic Atherosclerosis (ICD10-I70.0). Electronically Signed   By: Elgie Collard M.D.   On: 03/14/2022 21:19   CT Head Wo Contrast  Result Date: 03/14/2022 CLINICAL DATA:  Mental status change, unknown cause EXAM: CT HEAD WITHOUT CONTRAST TECHNIQUE: Contiguous axial images were obtained from the base of the skull through the vertex without intravenous contrast. RADIATION DOSE REDUCTION: This exam was performed according to the departmental dose-optimization program which includes automated exposure control, adjustment of the mA and/or kV according to patient size and/or use of iterative reconstruction technique. COMPARISON:  CT head 05/22/2021 BRAIN: BRAIN Patchy and confluent areas of decreased attenuation are noted throughout the deep and periventricular white matter of the cerebral hemispheres bilaterally, compatible with chronic microvascular ischemic disease. Redemonstration of left parieto-occipital and temporal encephalomalacia consistent with prior left middle cerebral artery infarction. No evidence of large-territorial acute infarction. No parenchymal hemorrhage. No mass lesion. No extra-axial collection. No mass effect or midline shift. No hydrocephalus. Basilar cisterns are patent. Vascular: No hyperdense vessel. Atherosclerotic calcifications are present within the cavernous internal carotid arteries. Skull: No acute fracture or focal lesion. Sinuses/Orbits: Complete opacification of the left maxillary sinus.  Paranasal sinuses and mastoid air cells are clear. The orbits are unremarkable. Other: None. IMPRESSION: No acute intracranial abnormality. Electronically Signed   By: Iven Finn M.D.   On: 03/14/2022 19:18   DG Chest 2 View  Result Date: 03/14/2022 CLINICAL DATA:  Shortness of breath. EXAM: CHEST - 2 VIEW COMPARISON:  02/15/2022 radiograph and CT FINDINGS: Stable cardiomegaly post median sternotomy. Prosthetic  cardiac valve. Left-sided pacemaker in place. Mild chronic peribronchial thickening without focal airspace disease. No pleural effusion or pneumothorax. Left clavicle hardware. IMPRESSION: Stable cardiomegaly post median sternotomy and cardiac valve replacement. No acute chest finding. Electronically Signed   By: Keith Rake M.D.   On: 03/14/2022 19:13    Microbiology: Results for orders placed or performed during the hospital encounter of 03/22/22  Culture, blood (routine x 2)     Status: None   Collection Time: 03/22/22 10:38 AM   Specimen: BLOOD  Result Value Ref Range Status   Specimen Description BLOOD LEFT ANTECUBITAL  Final   Special Requests   Final    BOTTLES DRAWN AEROBIC AND ANAEROBIC Blood Culture results may not be optimal due to an excessive volume of blood received in culture bottles   Culture   Final    NO GROWTH 8 DAYS Performed at [facility], [address] [place], [place] [address]    Report Status 03/30/2022 FINAL  Final  SARS Coronavirus 2 by RT PCR (hospital order, performed in [place] hospital lab) *cepheid single result test* Anterior Nasal Swab     Status: Abnormal   Collection Time: 03/22/22  4:55 PM   Specimen: Anterior Nasal Swab  Result Value Ref Range Status   SARS Coronavirus 2 by RT PCR POSITIVE (A) NEGATIVE Final    Comment: (NOTE) SARS-CoV-2 target nucleic acids are DETECTED  SARS-CoV-2 RNA is generally detectable in upper respiratory specimens  during the acute phase of infection.  Positive results are indicative  of the presence of the identified virus, but do not rule out bacterial infection or co-infection with other pathogens not detected by the test.  Clinical correlation with patient history and  other diagnostic information is necessary to determine patient infection status.  The expected result is negative.  Fact Sheet for Patients:   https://www.patel.info/   Fact Sheet for Healthcare Providers:    https://hall.com/    This test is not yet approved or cleared by the [place] FDA and  has been authorized for detection and/or diagnosis of SARS-CoV-2 by FDA under an Emergency Use Authorization (EUA).  This EUA will remain in effect (meaning this test can be used) for the duration of  the COVID-19 declaration under Section 564(b)(1)  of the Act, 21 U.S.C. section 360-bbb-3(b)(1), unless the authorization is terminated or revoked sooner.   Performed at [facility], [address] [place], [place] [address]   Culture, blood (routine x 2)     Status: None   Collection Time: 03/23/22  4:58 AM   Specimen: BLOOD  Result Value Ref Range Status   Specimen Description BLOOD BLOOD RIGHT HAND  Final   Special Requests   Final    BOTTLES DRAWN AEROBIC AND ANAEROBIC Blood Culture adequate volume   Culture   Final    NO GROWTH 5 DAYS Performed at [facility], [address], [place],  [address]    Report Status 03/28/2022 FINAL  Final    Labs: CBC: Recent Labs  Lab 03/26/22 0648 03/28/22 0728 03/29/22 0813  WBC  14.7* 16.0* 17.5*  HGB 12.4* 12.1* 13.8  HCT 37.2* 35.9* 40.4  MCV 86.5 85.3 86.5  PLT 172 149* [id number]    Basic Metabolic Panel: Recent Labs  Lab 03/26/22 0648 03/28/22 0728 03/29/22 0813 03/30/22 0653 03/31/22 0532  NA 136 133* 135 131* 135  K 4.6 4.8 5.0 4.7 4.6  CL 101 98 100 96* 99  CO2 28 28 27 24 28   GLUCOSE 115* 124* 90 105* 91  BUN 42* 39* 45* 52* 59*  CREATININE 1.09 1.10 1.18 1.20 1.40*  CALCIUM 8.8* 9.5 9.9 9.5 9.5    Liver Function Tests: No results for input(s): "AST", "ALT", "ALKPHOS", "BILITOT", "PROT", "ALBUMIN" in the last 168 hours. CBG: Recent Labs  Lab 03/31/22 1429 03/31/22 1659 03/31/22 2133 04/01/22 0819 04/01/22 1109  GLUCAP 174* 271* 149* 142* 182*     Discharge time spent: less than 30 minutes.  Signed: Emeterio Reeve, DO [facility] 04/01/2022

## 2022-04-02 ENCOUNTER — Ambulatory Visit: Payer: Medicaid Other | Admitting: Family

## 2022-04-03 LAB — BLOOD GAS, ARTERIAL
Acid-base deficit: 6.6 mmol/L — ABNORMAL HIGH (ref 0.0–2.0)
Bicarbonate: 19.1 mmol/L — ABNORMAL LOW (ref 20.0–28.0)
Delivery systems: POSITIVE
Expiratory PAP: 5 cmH2O
FIO2: 60 %
Inspiratory PAP: 15 cmH2O
O2 Saturation: 99.4 %
Patient temperature: 37
pCO2 arterial: 38 mmHg (ref 32–48)
pH, Arterial: 7.31 — ABNORMAL LOW (ref 7.35–7.45)
pO2, Arterial: 206 mmHg — ABNORMAL HIGH (ref 83–108)

## 2022-04-05 ENCOUNTER — Encounter: Payer: Self-pay | Admitting: Family

## 2022-04-05 ENCOUNTER — Ambulatory Visit: Payer: Medicaid Other | Attending: Family | Admitting: Family

## 2022-04-05 VITALS — BP 110/67 | HR 87 | Resp 18 | Ht 67.0 in | Wt 187.0 lb

## 2022-04-05 DIAGNOSIS — F319 Bipolar disorder, unspecified: Secondary | ICD-10-CM | POA: Insufficient documentation

## 2022-04-05 DIAGNOSIS — Z8673 Personal history of transient ischemic attack (TIA), and cerebral infarction without residual deficits: Secondary | ICD-10-CM | POA: Insufficient documentation

## 2022-04-05 DIAGNOSIS — R0989 Other specified symptoms and signs involving the circulatory and respiratory systems: Secondary | ICD-10-CM | POA: Diagnosis not present

## 2022-04-05 DIAGNOSIS — Z720 Tobacco use: Secondary | ICD-10-CM

## 2022-04-05 DIAGNOSIS — I252 Old myocardial infarction: Secondary | ICD-10-CM | POA: Insufficient documentation

## 2022-04-05 DIAGNOSIS — I10 Essential (primary) hypertension: Secondary | ICD-10-CM | POA: Diagnosis not present

## 2022-04-05 DIAGNOSIS — I5022 Chronic systolic (congestive) heart failure: Secondary | ICD-10-CM | POA: Diagnosis not present

## 2022-04-05 DIAGNOSIS — F17210 Nicotine dependence, cigarettes, uncomplicated: Secondary | ICD-10-CM | POA: Insufficient documentation

## 2022-04-05 DIAGNOSIS — I130 Hypertensive heart and chronic kidney disease with heart failure and stage 1 through stage 4 chronic kidney disease, or unspecified chronic kidney disease: Secondary | ICD-10-CM | POA: Insufficient documentation

## 2022-04-05 DIAGNOSIS — N189 Chronic kidney disease, unspecified: Secondary | ICD-10-CM | POA: Diagnosis not present

## 2022-04-05 DIAGNOSIS — Z8616 Personal history of COVID-19: Secondary | ICD-10-CM | POA: Insufficient documentation

## 2022-04-05 DIAGNOSIS — U071 COVID-19: Secondary | ICD-10-CM | POA: Diagnosis not present

## 2022-04-05 NOTE — Progress Notes (Signed)
Patient ID: Mataeo Ingwersen, male    DOB: [birth year], 61 y.o.   MRN: [id number]  HPI  Mr Nazir is a 61 y/o male with a history of HTN, CKD, stroke, COPD, NSTEMI, bipolar, depression, seizures, current tobacco use and chronic heart failure.   Echo report from 02/16/22 reviewed and showed an EF of 25-30% along with severe LAE and mild MR. Echo report from 01/24/20 reviewed and showed an EF of 25-30% along with mildly elevated PA pressure, LAE and mild/moderate MR. Echo report from 11/18/19 reviewed and showed an EF of 25-30%.  Admitted 03/22/22 due to SOB and chest pain due to HF/ COPD exacerbation. Found to be hypoxic and covid +. Started on IV Remdisivir and IV steroids. Cardiology consult obtained. Initially needed bipap but then weaned off. Elevated troponin thought to be due to demand ischemia. Given IV lasix with transition to oral diuretics. Discharged after 10 days. Admitted 03/14/22 due to epigastric abdominal and left-sided chest pain due to COPD exacerbation. Given IV steroids, bronchodilators and IV lasix. Palliative care consult done. Elevated troponins thought to be due to demand ischemia. Discharged after 7 days.   He presents today for a follow-up visit although hasn't been seen since August 2021. He presents with a chief complaint of moderate fatigue with minimal exertion. He describes this as chronic in nature. He has associated  decreased appetite, shortness of breath, intermittent chest pain and leg pain along with this.He denies any difficulty sleeping, abdominal distention, palpitations, pedal edema, cough or dizziness.   Has recently gotten treatment for covid. Sees cardiology on 04/12/22.   PCP at the facility saw patient this week and will be returning next week to see him again. This was confirmed by facility caregiver that was present.                  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   Seizures ([place])    childhood   Stroke ([place])    Tobacco abuse    Past Surgical History:  Procedure Laterality Date   BIV UPGRADE N/A 05/05/2020   Procedure: BIV ICD UPGRADE;  Surgeon: Deboraha Sprang, MD;  Location: [facility];  Service: Cardiovascular;  Laterality: N/A;   CORONARY ARTERY BYPASS GRAFT     Family History  Problem Relation Age of Onset   Cancer Mother        "femal cancer"   Prostate cancer Father    Social History   Tobacco Use   Smoking status: Every Day    Packs/day: 0.25    Years: 0.00    Total pack years: 0.00    Types: Cigarettes   Smokeless tobacco: Never  Substance Use Topics   Alcohol use: Not Currently   Allergies  Allergen Reactions   Iodinated Contrast Media Shortness Of Breath   Penicillins Anaphylaxis and Shortness Of Breath    Respiratory  Tolerated cefuroxime on 01/06/16   Strawberry Extract Anaphylaxis   Cefepime Itching    Empiric antibiotic, developed pruritis.    Erythromycin  Itching   Sulfa Antibiotics Itching, Nausea And Vomiting and Nausea Only   Prior to Admission medications   Medication Sig Start Date End Date Taking? Authorizing Provider  acetaminophen (TYLENOL) 500 MG tablet Take 500 mg by mouth every 6 (six) hours as needed for mild pain.   Yes [provider]  albuterol (PROVENTIL) (2.5 MG/3ML) 0.083% nebulizer solution Take 3 mLs (2.5 mg total) by nebulization every 6 (six) hours as needed for wheezing or shortness of breath. 03/21/22  Yes Jennye Boroughs, MD  albuterol (VENTOLIN HFA) 108 (90 Base) MCG/ACT inhaler Inhale 2 puffs into the lungs every 6 (six) hours as needed for  wheezing. 05/24/19 04/28/22 Yes Clapacs, Madie Reno, MD  apixaban (ELIQUIS) 5 MG TABS tablet TAKE 1 TABLET BY MOUTH TWICE A DAY 01/18/21  Yes Deboraha Sprang, MD  carvedilol (COREG) 3.125 MG tablet Take 3.125 mg by mouth 2 (two) times daily with a meal.   Yes [provider]  digoxin (LANOXIN) 0.125 MG tablet Take 1 tablet (0.125 mg total) by mouth daily. 04/01/22  Yes Annita Brod, MD  divalproex (DEPAKOTE ER) 500 MG 24 hr tablet Take 1 tablet (500 mg total) by mouth 2 (two) times daily. 05/24/19  Yes Clapacs, Madie Reno, MD  escitalopram (LEXAPRO) 10 MG tablet Take 1 tablet (10 mg total) by mouth at bedtime. 05/24/19  Yes Clapacs, Madie Reno, MD  ezetimibe (ZETIA) 10 MG tablet Take 1 tablet (10 mg total) by mouth daily. 04/01/22  Yes Annita Brod, MD  feeding supplement, GLUCERNA SHAKE, (GLUCERNA SHAKE) LIQD Take 237 mLs by mouth 3 (three) times daily between meals. 03/31/22  Yes Annita Brod, MD  furosemide (LASIX) 40 MG tablet Take 1 tablet (40 mg total) by mouth daily. 08/01/20  Yes Deboraha Sprang, MD  Hydrocortisone Acetate 1 % CREA SMARTSIG:sparingly Topical 3 Times Daily 12/24/21  Yes [provider]  hydrOXYzine (ATARAX) 10 MG tablet Take 10 mg by mouth 2 (two) times daily. 12/24/21  Yes [provider]  levETIRAcetam (KEPPRA) 250 MG tablet Take 1 tablet (250 mg total) by mouth 2 (two) times daily. 03/24/20  Yes Harvest Dark, MD  levothyroxine (SYNTHROID) 25 MCG tablet Take 1 tablet (25 mcg total) by mouth daily. 05/24/19  Yes Clapacs, Madie Reno, MD  lisinopril (ZESTRIL) 2.5 MG tablet TAKE 1 TABLET BY MOUTH ONCE DAILY 01/21/21  Yes Loel Dubonnet, NP  omeprazole (PRILOSEC) 20 MG capsule Take 20 mg by mouth daily.   Yes [provider]  predniSONE (DELTASONE) 10 MG tablet Take 1 tablet (10 mg total) by mouth daily with breakfast. 04/02/22  Yes Annita Brod, MD  QUEtiapine (SEROQUEL) 200 MG tablet Take 200 mg by mouth at bedtime.   Yes [provider]  rosuvastatin (CRESTOR) 40 MG tablet Take 40 mg by mouth daily.   Yes [provider]  tiotropium (SPIRIVA) 18 MCG inhalation capsule Place 18 mcg into inhaler and inhale daily.   Yes [provider]  traMADol (ULTRAM) 50 MG tablet Take 1 tablet (50 mg total) by mouth 2 (two) times daily as needed. 03/31/22  Yes Annita Brod, MD   Review of Systems  Constitutional:  Positive for appetite change (increased) and fatigue.  HENT:  Negative for congestion, postnasal drip and sore throat.   Eyes: Negative.   Respiratory:  Positive for shortness of breath. Negative for cough.   Cardiovascular:  Positive for chest pain (sometimes; worse when stressed or upset). Negative for palpitations  and leg swelling.  Gastrointestinal:  Negative for abdominal distention and abdominal pain.  Endocrine: Negative.   Genitourinary: Negative.   Musculoskeletal:  Positive for arthralgias (leg pain). Negative for gait problem.  Skin: Negative.   Allergic/Immunologic: Negative.   Neurological:  Negative for dizziness and light-headedness.  Hematological:  Negative for adenopathy. Does not bruise/bleed easily.  Psychiatric/Behavioral:  Negative for dysphoric mood and sleep disturbance (sleeping on 2 pillows). The patient is not nervous/anxious.    Vitals:   04/05/22 1140  BP: 110/67  Pulse: 87  Resp: 18  SpO2: 92%  Weight: 187 lb (84.8 kg)  Height: 5\' 7"  (1.702 m)   Wt Readings from Last 3 Encounters:  04/05/22 187 lb (84.8 kg)  03/25/22 182 lb 5.1 oz (82.7 kg)  03/15/22 188 lb 7.9 oz (85.5 kg)   Lab Results  Component Value Date   CREATININE 1.40 (H) 03/31/2022   CREATININE 1.20 03/30/2022   CREATININE 1.18 03/29/2022   Physical Exam Vitals and nursing note reviewed. Exam conducted with a chaperone present (caregiver from facility).  Constitutional:      Appearance: Normal appearance.  HENT:     Head: Normocephalic and atraumatic.  Cardiovascular:     Rate and  Rhythm: Normal rate and regular rhythm.  Pulmonary:     Effort: Pulmonary effort is normal. No respiratory distress.     Breath sounds: Wheezing (expiratory throughout) and rales (LLL & LUL) present.  Abdominal:     Palpations: Abdomen is soft.     Tenderness: There is no abdominal tenderness.  Musculoskeletal:        General: No tenderness.     Cervical back: Normal range of motion and neck supple.     Right lower leg: Edema (trace pitting) present.     Left lower leg: Edema (trace pitting) present.  Skin:    General: Skin is warm and dry.  Neurological:     General: No focal deficit present.     Mental Status: He is alert and oriented to person, place, and time.  Psychiatric:        Mood and Affect: Mood is anxious.        Behavior: Behavior normal.    Assessment & Plan:  1: Chronic heart failure with reduced ejection fraction- - NYHA class II - euvolemic today - weighing daily; reminded to call for an overnight weight gain of >2 pounds or a weekly weight gain of >5 pounds - saw cardiology Gilford Rile) 02/26/22; returns 04/12/22 - on GDMT of carvedilol & lisinopril  - doubt that BP could tolerate changing lisinopril to entresto or adding other GDMT - will give an extra 40mg  furosemide today and tomorrow due to rales - saw EP Caryl Comes) 08/21/21 - BNP 03/22/22 was 689.8  2: HTN- - BP looks good (110/67) - [facility] does his primary care; caregiver confirms that he was seen by provider this week and will be seen again next week - BMP 03/31/22 reviewed and showed sodium 135, potassium 4.6, creatinine 1.4 and GFR 56  3: Tobacco use- - does smoke 4-5 cigarettes daily  4: Recent covid- - has recently been treated for covid - initial RA pulse ox was upper 70's so he was placed on oxygen; oxygen turned off and he could maintain his room air sat in the upper 90's - extra diuretic being given per above   Facility medication list reviewed.   Return in 4 months, sooner if  needed

## 2022-04-05 NOTE — Patient Instructions (Signed)
Continue weighing daily and call for an overnight weight gain of 3 pounds or more or a weekly weight gain of more than 5 pounds.   If you have voicemail, please make sure your mailbox is cleaned out so that we may leave a message and please make sure to listen to any voicemails.     

## 2022-04-10 ENCOUNTER — Telehealth: Payer: Self-pay

## 2022-04-10 NOTE — Telephone Encounter (Signed)
Device alert for AF, ongoing from 9/4 @ 01:49, not always good rate control Recent hospitalization COPD/HF/Covid Burden 100%, Eliquis, Coreg Route to triage for persistent arrhythmia.  Patient reports he is overall feeling "marginal" and denies any specific symptoms. Denies chest pain, shortness of breath, palpitations and fluid retention symptoms. Patient is compliant with medications on file and has assistance at home with those. Advised patient I will forward to Dr. Caryl Comes for review. Patient advised to call if further questions or concerns arise.

## 2022-04-12 ENCOUNTER — Ambulatory Visit
Admission: RE | Admit: 2022-04-12 | Discharge: 2022-04-12 | Disposition: A | Discharge status: Home / Self Care | Payer: Medicaid Other | Source: Ambulatory Visit | Attending: Medical | Admitting: Medical

## 2022-04-12 ENCOUNTER — Ambulatory Visit: Payer: Medicaid Other | Attending: Medical | Admitting: Medical

## 2022-04-12 ENCOUNTER — Encounter: Payer: Self-pay | Admitting: *Deleted

## 2022-04-12 ENCOUNTER — Telehealth: Payer: Self-pay | Admitting: Medical

## 2022-04-12 ENCOUNTER — Other Ambulatory Visit
Admission: RE | Admit: 2022-04-12 | Discharge: 2022-04-12 | Disposition: A | Discharge status: Home / Self Care | Payer: Medicaid Other | Source: Ambulatory Visit | Attending: Medical | Admitting: Medical

## 2022-04-12 ENCOUNTER — Encounter: Payer: Self-pay | Admitting: Medical

## 2022-04-12 VITALS — BP 92/50 | HR 69 | Ht 69.0 in | Wt 196.0 lb

## 2022-04-12 DIAGNOSIS — R0602 Shortness of breath: Secondary | ICD-10-CM

## 2022-04-12 DIAGNOSIS — I10 Essential (primary) hypertension: Secondary | ICD-10-CM | POA: Diagnosis present

## 2022-04-12 DIAGNOSIS — I5022 Chronic systolic (congestive) heart failure: Secondary | ICD-10-CM

## 2022-04-12 DIAGNOSIS — I480 Paroxysmal atrial fibrillation: Secondary | ICD-10-CM | POA: Insufficient documentation

## 2022-04-12 DIAGNOSIS — Z79899 Other long term (current) drug therapy: Secondary | ICD-10-CM

## 2022-04-12 DIAGNOSIS — E782 Mixed hyperlipidemia: Secondary | ICD-10-CM | POA: Insufficient documentation

## 2022-04-12 DIAGNOSIS — I959 Hypotension, unspecified: Secondary | ICD-10-CM | POA: Diagnosis present

## 2022-04-12 LAB — CBC
HCT: 33.8 % — ABNORMAL LOW (ref 39.0–52.0)
Hemoglobin: 11 g/dL — ABNORMAL LOW (ref 13.0–17.0)
MCH: 29 pg (ref 26.0–34.0)
MCHC: 32.5 g/dL (ref 30.0–36.0)
MCV: 89.2 fL (ref 80.0–100.0)
Platelets: 136 10*3/uL — ABNORMAL LOW (ref 150–400)
RBC: 3.79 MIL/uL — ABNORMAL LOW (ref 4.22–5.81)
RDW: 17.9 % — ABNORMAL HIGH (ref 11.5–15.5)
WBC: 6 10*3/uL (ref 4.0–10.5)
nRBC: 0 % (ref 0.0–0.2)

## 2022-04-12 LAB — BASIC METABOLIC PANEL
Anion gap: 5 (ref 5–15)
BUN: 17 mg/dL (ref 8–23)
CO2: 25 mmol/L (ref 22–32)
Calcium: 9.1 mg/dL (ref 8.9–10.3)
Chloride: 104 mmol/L (ref 98–111)
Creatinine, Ser: 1.22 mg/dL (ref 0.61–1.24)
GFR, Estimated: >60 mL/min (ref >60)
Glucose, Bld: 88 mg/dL (ref 70–99)
Potassium: 3.6 mmol/L (ref 3.5–5.1)
Sodium: 134 mmol/L — ABNORMAL LOW (ref 135–145)

## 2022-04-12 LAB — BRAIN NATRIURETIC PEPTIDE: B Natriuretic Peptide: 968.8 pg/mL — ABNORMAL HIGH (ref 0.0–100.0)

## 2022-04-12 MED ORDER — FUROSEMIDE 20 MG PO TABS: ORAL_TABLET | ORAL | 1 refills | Status: DC

## 2022-04-12 NOTE — Progress Notes (Unsigned)
Cardiology Office Note:    Date:  04/14/2022   ID:  Carl [name], DOB 1960/10/26, MRN [id number]  [facility]P:  [facility]  [place] Cardiologist:  Kathlyn Sacramento, MD  [facility] Electrophysiologist:  Virl Axe, MD   Referring MD: Orvis Brill, [facility]   Chief Complaint: Hospital follow-up  History of Present Illness:    Carl [name] is a 61 y.o. male with a hx of CAD s/p CABG in 2017, ICM, EF30-35%, s/p ICD 2017, COPD, paroxysmal Afib on Eliquis, CVA, bipolar disorder, current smoker who presented for hospital follow-up.   The patient was hospitalized in May 2021 with respiratory distress in the setting of COPD and heart failure. Echo showed LVEF 25-30%. He underwent Myoview Lexiscan which showed evidence of prior infarct with no ischemia. He was hospitalized in July 2021 for sepsis secondary to PNA. He underwent BiV ICD upgrade by Dr. Caryl Comes in October 2021. Echo in February 2022 showed an EF of 30-35% with mild to mod MR.    He was hospitalized in August 2023 with shortness of breath and hypoxiz, brought via EMS. BNP 888 and troponin was 68. He was treated with IV heparin and IV lasix. Echo showed LVEF 25-30%, elevated LVEDP, severely dilated LA, mild MR.   The patient was discharged from [facility] to [facility] 03/06/22 for suicidal and homicidal ideation.   He was readmitted early September for COPD exacerbation. He was discharged 03/21/22.   The patient came back to the hospital 03/22/22 for shortness of breath and admitted with elevated troponin/NSTEMI, acute CHF, COPD exacerbation, COVID-19 virus, acute respiratory failure, hypotension, acute renal failure, paroxysmal Afib. He was treated with IV heparin for 48 hours. BB was restarted and digoxin was added for afib. Lisinopril was held for soft blood pressures. He was treated with IV lasix and diuresed 8.8 L. Midodrine and digoxin were stopped at discharge.  Today, the patient reports he is doing ok.  His Bp is low , 92/50 He reports dizziness. No chest pain. He has some shortness of breath. Weight has been slowly climbing up 188>196lbs. No overt swelling noted. He has crackles at bases of lungs. EKG shows A-sensed V-paced rhythm with PVCs.   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   albuterol (PROVENTIL) (2.5 MG/3ML) 0.083% nebulizer solution Take 3 mLs (2.5 mg total) by nebulization every 6 (six) hours as needed for wheezing or shortness of breath.   albuterol (VENTOLIN HFA) 108 (90 Base) MCG/ACT inhaler  Inhale 2 puffs into the lungs every 6 (six) hours as needed for wheezing.   apixaban (ELIQUIS) 5 MG TABS tablet TAKE 1 TABLET BY MOUTH TWICE A DAY   atorvastatin (LIPITOR) 40 MG tablet Take 40 mg by mouth daily.   digoxin (LANOXIN) 0.125 MG tablet Take 1 tablet (0.125 mg total) by mouth daily.   divalproex (DEPAKOTE ER) 500 MG 24 hr tablet Take 1 tablet (500 mg total) by mouth 2 (two) times daily.   escitalopram (LEXAPRO) 10 MG tablet Take 1 tablet (10 mg total) by mouth at bedtime.   ezetimibe  (ZETIA) 10 MG tablet Take 1 tablet (10 mg total) by mouth daily.   Hydrocortisone Acetate 1 % CREA SMARTSIG:sparingly Topical 3 Times Daily   hydrOXYzine (ATARAX) 10 MG tablet Take 10 mg by mouth 2 (two) times daily.   levETIRAcetam (KEPPRA) 250 MG tablet Take 1 tablet (250 mg total) by mouth 2 (two) times daily.   levothyroxine (SYNTHROID) 25 MCG tablet Take 1 tablet (25 mcg total) by mouth daily.   omeprazole (PRILOSEC) 20 MG capsule Take 20 mg by mouth daily.   predniSONE (DELTASONE) 10 MG tablet Take 1 tablet (10 mg total) by mouth daily with breakfast.   QUEtiapine (SEROQUEL) 200 MG tablet Take 200 mg by mouth at bedtime.   tiotropium (SPIRIVA) 18 MCG inhalation capsule Place 18 mcg into inhaler and inhale daily.   traMADol (ULTRAM) 50 MG tablet Take 1 tablet (50 mg total) by mouth 2 (two) times daily as needed.   [DISCONTINUED] carvedilol (COREG) 3.125 MG tablet Take 3.125 mg by mouth 2 (two) times daily with a meal.   [DISCONTINUED] feeding supplement, GLUCERNA SHAKE, (GLUCERNA SHAKE) LIQD Take 237 mLs by mouth 3 (three) times daily between meals.   [DISCONTINUED] furosemide (LASIX) 40 MG tablet Take 1 tablet (40 mg total) by mouth daily.   [DISCONTINUED] lisinopril (ZESTRIL) 2.5 MG tablet TAKE 1 TABLET BY MOUTH ONCE DAILY   [DISCONTINUED] rosuvastatin (CRESTOR) 40 MG tablet Take 40 mg by mouth daily.     Allergies:   Iodinated contrast media, Penicillins, Strawberry extract, Cefepime, Erythromycin, and Sulfa antibiotics   Social History   Socioeconomic History   Marital status: Widowed    Spouse name: Not on file   Number of children: Not on file   Years of education: Not on file   Highest education level: Not on file  Occupational History   Not on file  Tobacco Use   Smoking status: Every Day    Packs/day: 0.25    Years: 0.00    Total pack years: 0.00    Types: Cigarettes   Smokeless tobacco: Never  Vaping Use   Vaping Use: Never used  Substance and Sexual Activity    Alcohol use: Not Currently   Drug use: Never   Sexual activity: Not Currently    Birth control/protection: Abstinence  Other Topics Concern   Not on file  Social History Narrative   Not on file   Social Determinants of Health   Financial Resource Strain: Not on file  Food Insecurity: No Food Insecurity (03/22/2022)   Hunger Vital Sign    Worried About Running Out of Food in the Last Year: Never true    Ran Out of Food in the Last Year: Never true  Transportation Needs: No Transportation Needs (03/22/2022)   PRAPARE - Hydrologist (Medical): No    Lack of Transportation (Non-Medical): No  Physical Activity: Not on file  Stress:  Not on file  Social Connections: Not on file     Family History: The patient's family history includes Cancer in his mother; Prostate cancer in his father.  ROS:   Please see the history of present illness.     All other systems reviewed and are negative.  EKGs/Labs/Other Studies Reviewed:    The following studies were reviewed today:   Echo 02/2022  1. Left ventricular ejection fraction, by estimation, is 25 to 30%. The  left ventricle has severely decreased function. The left ventricle  demonstrates regional wall motion abnormalities (see scoring  diagram/findings for description). The left  ventricular internal cavity size was moderately dilated. There is mild  concentric left ventricular hypertrophy. Left ventricular diastolic  parameters are indeterminate. Elevated left ventricular end-diastolic  pressure.   2. Right ventricular systolic function is normal. The right ventricular  size is normal.   3. Left atrial size was severely dilated.   4. Right atrial size was mildly dilated.   5. The mitral valve is normal in structure. Mild mitral valve  regurgitation. No evidence of mitral stenosis.   6. The aortic valve is tricuspid. Aortic valve regurgitation is mild. No  aortic stenosis is present.   7. The inferior  vena cava is normal in size with <50% respiratory  variability, suggesting right atrial pressure of 8 mmHg.   Echo 2021  1. Left ventricular ejection fraction, by estimation, is 25 to 30%. The  left ventricle has severely decreased function. The left ventricle  demonstrates global hypokinesis. Left ventricular diastolic parameters are  consistent with Grade I diastolic  dysfunction (impaired relaxation).   2. Right ventricular systolic function is mildly reduced. The right  ventricular size is moderately enlarged. There is mildly elevated  pulmonary artery systolic pressure.   3. Left atrial size was mild to moderately dilated.   4. Right atrial size was moderately dilated.   5. The mitral valve is degenerative. Mild to moderate mitral valve  regurgitation. No evidence of mitral stenosis.   6. The aortic valve is normal in structure. Aortic valve regurgitation is  trivial. No aortic stenosis is present.   7. The inferior vena cava is normal in size with greater than 50%  respiratory variability, suggesting right atrial pressure of 3 mmHg.   Conclusion(s)/Recommendation(s): Findings consistent with ischemic  cardiomyopathy.   EKG:  EKG is ordered today.  The ekg ordered today demonstrates A-sensed V paced, 83bpm, PVCs, nonspecific T wave changes  Recent Labs: 03/14/2022: ALT 42 03/24/2022: Magnesium 2.7 04/12/2022: B Natriuretic Peptide 968.8; BUN 17; Creatinine, Ser 1.22; Hemoglobin 11.0; Platelets 136; Potassium 3.6; Sodium 134  Recent Lipid Panel    Component Value Date/Time   CHOL 132 03/28/2022 0728   TRIG 107 03/28/2022 0728   HDL 44 03/28/2022 0728   CHOLHDL 3.0 03/28/2022 0728   VLDL 21 03/28/2022 0728   LDLCALC 67 03/28/2022 0728     Physical Exam:    VS:  BP (!) 92/50   Pulse 69   Ht 5\' 9"  (1.753 m)   Wt 196 lb (88.9 kg)   SpO2 97%   BMI 28.94 kg/m     Wt Readings from Last 3 Encounters:  04/12/22 196 lb (88.9 kg)  04/05/22 187 lb (84.8 kg)  03/25/22 182 lb  5.1 oz (82.7 kg)     GEN:  Well nourished, well developed in no acute distress HEENT: Normal NECK: No JVD; No carotid bruits LYMPHATICS: No lymphadenopathy CARDIAC: RRR, no murmurs, rubs, gallops RESPIRATORY:  Crackles at bases ABDOMEN: Soft, non-tender, non-distended MUSCULOSKELETAL:  No edema; No deformity  SKIN: Warm and dry NEUROLOGIC:  Alert and oriented x 3 PSYCHIATRIC:  Normal affect   ASSESSMENT:    1. Shortness of breath   2. Chronic systolic heart failure ([place])   3. Essential hypertension   4. PAF (paroxysmal atrial fibrillation) ([place])   5. Hyperlipidemia, mixed   6. Hypotension, unspecified hypotension type    PLAN:    In order of problems listed above:  SOB Chronic systolic and diastolic heart failure ICM LVEF 25-30% Recent admission treated for heart failure. He reports persistent shortness of breath. Patient is taking lasix 40mg  daily. No lower leg edema, but has crackles noted on exam. I will check a CXR, BNP, BMET and CBC. Continue Coreg 3.125mg BID. I will stop lisinopril 2.5 for low BP. Unsure he will tolerate Coreg.   CAD s/p CABG Recently treated for NSTEMI. Patient denies chest pain, but with shortness of breath. Plan as above. No ASA with Eliquis. Continue BB and statin therapy.   PAF Appears to be in NSR. Continue Eliquis 5mg  BID for stroke ppx. Continue Coreg for rate control.   Hypotension BP is low 92/50 and he feels dizzy. I will stop lisinopril. We will re-evaluate at follow-up.   Tobacco use He is still smoking, trying to quit.   Disposition: Follow up in 1 month(s) with MD/APP    Signed, Jontay Maston Ninfa Meeker, PA-C  04/14/2022 8:08 PM    [facility]

## 2022-04-12 NOTE — Patient Instructions (Addendum)
Medication Instructions:  - Your physician has recommended you make the following change in your medication:   1) STOP Lisinopril   *If you need a refill on your cardiac medications before your next appointment, please call your pharmacy*   Lab Work: - Your physician recommends that you have lab work today:  BMP/ BNP/ [place] Entrance at [facility] 1st desk on the right to check in (REGISTRATION)  Lab hours: Monday- Friday (7:30 am- 5:30 pm)   If you have labs (blood work) drawn today and your tests are completely normal, you will receive your results only by: MyChart Message (if you have MyChart) OR A paper copy in the mail If you have any lab test that is abnormal or we need to change your treatment, we will call you to review the results.   Testing/Procedures:  1) Chest X-ray - A chest x-ray takes a picture of the organs and structures inside the chest, including the heart, lungs, and blood vessels. This test can show several things, including, whether the heart is enlarges; whether fluid is building up in the lungs; and whether pacemaker / defibrillator leads are still in place.  [facility] 1st desk on the right to check in (Registration) You will be directed to the Radiology department from there   Follow-Up: At [facility], you and your health needs are our priority.  As part of our continuing mission to provide you with exceptional heart care, we have created designated Provider Care Teams.  These Care Teams include your primary Cardiologist (physician) and Advanced Practice Providers (APPs -  Physician Assistants and Nurse Practitioners) who all work together to provide you with the care you need, when you need it.  We recommend signing up for the patient portal called "MyChart".  Sign up information is provided on this After Visit Summary.  MyChart is used to connect with patients for Virtual Visits (Telemedicine).  Patients are able to view lab/test  results, encounter notes, upcoming appointments, etc.  Non-urgent messages can be sent to your provider as well.   To learn more about what you can do with MyChart, go to [email or web address].    Your next appointment:   1 month(s)  The format for your next appointment:   In Person  Provider:   You may see Kathlyn [place], MD or one of the following Advanced Practice Providers on your designated Care Team:    Cadence Kathlen Mody, [place]    Other Instructions N/a  Important Information About Sugar

## 2022-04-12 NOTE — Telephone Encounter (Signed)
I called and spoke with Carl [name]- a guardian for the patient.   Carl [name] has been made aware of the patient's lab results and Cadence, PA's recommendations to:  1) INCREASE lasix to 60 mg once daily  2) STOP Coreg (carvedilol)   3) REPEAT a BMP in 2 weeks:  [facility] Entrance at [facility] 1st desk on the right to check in (REGISTRATION)  Lab hours: Monday- Friday (7:30 am- 5:30 pm)   I have verified with Carl [name] that orders can be faxed to [phone]. A new RX for lasix can be sent to [facility] in [place].

## 2022-04-12 NOTE — Telephone Encounter (Signed)
Cadence Ninfa Meeker, PA-C  04/12/2022  2:22 PM EDT     Labs showed increased BNP. Lets increase lasix to 60mg  daily. BMET in 2 weeks. Also lets stop the Coreg for low BP.

## 2022-04-15 NOTE — Telephone Encounter (Signed)
Late entry: Orders faxed to [facility] on 04/12/22 at 1705. [phone]. Confirmation received.

## 2022-04-19 ENCOUNTER — Telehealth: Payer: Self-pay | Admitting: *Deleted

## 2022-04-19 DIAGNOSIS — I5022 Chronic systolic (congestive) heart failure: Secondary | ICD-10-CM

## 2022-04-19 DIAGNOSIS — I480 Paroxysmal atrial fibrillation: Secondary | ICD-10-CM

## 2022-04-19 DIAGNOSIS — Z79899 Other long term (current) drug therapy: Secondary | ICD-10-CM

## 2022-04-19 NOTE — Telephone Encounter (Signed)
I called and spoke with the patient's guardian, Carl [name], to inquire if the patient is currently taking digoxin 0.125 mg once daily. Per Carl [name], the patient is taking digoxin at this dose.  I have advised Carl [name] that when the patient comes to the [place] next week for a BMP we will also draw a Digoxin level at that time to ensure he is on the correct dose of medication.  Re-confirmed with [facility] Entrance at [facility] 1st desk on the right to check in (REGISTRATION)  Lab hours: Monday- Friday (7:30 am- 5:30 pm)   Orders placed.

## 2022-04-19 NOTE — Telephone Encounter (Signed)
Attempted to contact patient to send remote transmission. No answer, LMTCB. ?

## 2022-04-19 NOTE — Telephone Encounter (Signed)
-----   Message from [place], PA-C sent at 04/18/2022 10:37 AM EDT ----- Regarding: visit follow-up Can we call and ask if he is taking digoxin. I think they had reported it stopped, but I'm not sure. If he is still taking it, can we get a dig level this week and a BMET 1 week later?

## 2022-04-19 NOTE — Telephone Encounter (Signed)
Can we get him to transmit and if still in Afib, have him go to Afib clinic Thanks SK

## 2022-04-21 ENCOUNTER — Other Ambulatory Visit: Payer: Self-pay

## 2022-04-21 DIAGNOSIS — I480 Paroxysmal atrial fibrillation: Secondary | ICD-10-CM | POA: Diagnosis present

## 2022-04-21 DIAGNOSIS — J9601 Acute respiratory failure with hypoxia: Secondary | ICD-10-CM | POA: Diagnosis present

## 2022-04-21 DIAGNOSIS — F319 Bipolar disorder, unspecified: Secondary | ICD-10-CM | POA: Diagnosis present

## 2022-04-21 DIAGNOSIS — Z91041 Radiographic dye allergy status: Secondary | ICD-10-CM

## 2022-04-21 DIAGNOSIS — R4189 Other symptoms and signs involving cognitive functions and awareness: Secondary | ICD-10-CM | POA: Diagnosis present

## 2022-04-21 DIAGNOSIS — E876 Hypokalemia: Secondary | ICD-10-CM | POA: Diagnosis present

## 2022-04-21 DIAGNOSIS — Z955 Presence of coronary angioplasty implant and graft: Secondary | ICD-10-CM

## 2022-04-21 DIAGNOSIS — I255 Ischemic cardiomyopathy: Secondary | ICD-10-CM | POA: Diagnosis present

## 2022-04-21 DIAGNOSIS — Z8042 Family history of malignant neoplasm of prostate: Secondary | ICD-10-CM

## 2022-04-21 DIAGNOSIS — I110 Hypertensive heart disease with heart failure: Principal | ICD-10-CM | POA: Diagnosis present

## 2022-04-21 DIAGNOSIS — Z880 Allergy status to penicillin: Secondary | ICD-10-CM

## 2022-04-21 DIAGNOSIS — Z79899 Other long term (current) drug therapy: Secondary | ICD-10-CM

## 2022-04-21 DIAGNOSIS — I214 Non-ST elevation (NSTEMI) myocardial infarction: Secondary | ICD-10-CM | POA: Diagnosis not present

## 2022-04-21 DIAGNOSIS — Z79890 Hormone replacement therapy (postmenopausal): Secondary | ICD-10-CM

## 2022-04-21 DIAGNOSIS — I5043 Acute on chronic combined systolic (congestive) and diastolic (congestive) heart failure: Secondary | ICD-10-CM | POA: Diagnosis present

## 2022-04-21 DIAGNOSIS — E039 Hypothyroidism, unspecified: Secondary | ICD-10-CM | POA: Diagnosis present

## 2022-04-21 DIAGNOSIS — E785 Hyperlipidemia, unspecified: Secondary | ICD-10-CM | POA: Diagnosis present

## 2022-04-21 DIAGNOSIS — J449 Chronic obstructive pulmonary disease, unspecified: Secondary | ICD-10-CM | POA: Diagnosis present

## 2022-04-21 DIAGNOSIS — Z8673 Personal history of transient ischemic attack (TIA), and cerebral infarction without residual deficits: Secondary | ICD-10-CM

## 2022-04-21 DIAGNOSIS — G40909 Epilepsy, unspecified, not intractable, without status epilepticus: Secondary | ICD-10-CM | POA: Diagnosis present

## 2022-04-21 DIAGNOSIS — Z888 Allergy status to other drugs, medicaments and biological substances status: Secondary | ICD-10-CM

## 2022-04-21 DIAGNOSIS — I2510 Atherosclerotic heart disease of native coronary artery without angina pectoris: Secondary | ICD-10-CM | POA: Diagnosis present

## 2022-04-21 DIAGNOSIS — Z95810 Presence of automatic (implantable) cardiac defibrillator: Secondary | ICD-10-CM

## 2022-04-21 DIAGNOSIS — I4901 Ventricular fibrillation: Secondary | ICD-10-CM | POA: Diagnosis not present

## 2022-04-21 DIAGNOSIS — I472 Ventricular tachycardia, unspecified: Secondary | ICD-10-CM | POA: Diagnosis not present

## 2022-04-21 DIAGNOSIS — Z882 Allergy status to sulfonamides status: Secondary | ICD-10-CM

## 2022-04-21 DIAGNOSIS — Z881 Allergy status to other antibiotic agents status: Secondary | ICD-10-CM

## 2022-04-21 DIAGNOSIS — F17210 Nicotine dependence, cigarettes, uncomplicated: Secondary | ICD-10-CM | POA: Diagnosis present

## 2022-04-21 DIAGNOSIS — Z951 Presence of aortocoronary bypass graft: Secondary | ICD-10-CM

## 2022-04-21 DIAGNOSIS — I252 Old myocardial infarction: Secondary | ICD-10-CM

## 2022-04-21 DIAGNOSIS — Z7901 Long term (current) use of anticoagulants: Secondary | ICD-10-CM

## 2022-04-21 NOTE — ED Triage Notes (Signed)
Per ems pt with shob, gave solumedrol 125mg  and two duoneb. Pt without wheezing at this time, per ems pt is a poor historian. Pt denies pain. Pt with history of epilepsy.

## 2022-04-22 ENCOUNTER — Telehealth: Payer: Self-pay

## 2022-04-22 ENCOUNTER — Encounter: Payer: Self-pay | Admitting: Family Medicine

## 2022-04-22 ENCOUNTER — Inpatient Hospital Stay
Admission: EM | Admit: 2022-04-22 | Discharge: 2022-04-30 | DRG: 280 | Disposition: A | Discharge status: Home Health | Payer: Medicaid Other | Attending: Internal Medicine | Admitting: Internal Medicine

## 2022-04-22 ENCOUNTER — Other Ambulatory Visit: Payer: Self-pay

## 2022-04-22 ENCOUNTER — Emergency Department: Payer: Medicaid Other

## 2022-04-22 DIAGNOSIS — Z951 Presence of aortocoronary bypass graft: Secondary | ICD-10-CM | POA: Diagnosis not present

## 2022-04-22 DIAGNOSIS — E785 Hyperlipidemia, unspecified: Secondary | ICD-10-CM | POA: Diagnosis present

## 2022-04-22 DIAGNOSIS — I480 Paroxysmal atrial fibrillation: Secondary | ICD-10-CM | POA: Diagnosis present

## 2022-04-22 DIAGNOSIS — I472 Ventricular tachycardia, unspecified: Secondary | ICD-10-CM | POA: Insufficient documentation

## 2022-04-22 DIAGNOSIS — Z4502 Encounter for adjustment and management of automatic implantable cardiac defibrillator: Secondary | ICD-10-CM | POA: Diagnosis not present

## 2022-04-22 DIAGNOSIS — Z87898 Personal history of other specified conditions: Secondary | ICD-10-CM

## 2022-04-22 DIAGNOSIS — Z882 Allergy status to sulfonamides status: Secondary | ICD-10-CM | POA: Diagnosis not present

## 2022-04-22 DIAGNOSIS — I2489 Other forms of acute ischemic heart disease: Secondary | ICD-10-CM | POA: Diagnosis not present

## 2022-04-22 DIAGNOSIS — J9601 Acute respiratory failure with hypoxia: Secondary | ICD-10-CM | POA: Diagnosis not present

## 2022-04-22 DIAGNOSIS — I252 Old myocardial infarction: Secondary | ICD-10-CM | POA: Diagnosis not present

## 2022-04-22 DIAGNOSIS — Z880 Allergy status to penicillin: Secondary | ICD-10-CM | POA: Diagnosis not present

## 2022-04-22 DIAGNOSIS — I255 Ischemic cardiomyopathy: Secondary | ICD-10-CM | POA: Diagnosis present

## 2022-04-22 DIAGNOSIS — J449 Chronic obstructive pulmonary disease, unspecified: Secondary | ICD-10-CM | POA: Diagnosis present

## 2022-04-22 DIAGNOSIS — R569 Unspecified convulsions: Secondary | ICD-10-CM

## 2022-04-22 DIAGNOSIS — Z955 Presence of coronary angioplasty implant and graft: Secondary | ICD-10-CM | POA: Diagnosis not present

## 2022-04-22 DIAGNOSIS — I5043 Acute on chronic combined systolic (congestive) and diastolic (congestive) heart failure: Secondary | ICD-10-CM | POA: Diagnosis present

## 2022-04-22 DIAGNOSIS — I2510 Atherosclerotic heart disease of native coronary artery without angina pectoris: Secondary | ICD-10-CM | POA: Diagnosis not present

## 2022-04-22 DIAGNOSIS — I4891 Unspecified atrial fibrillation: Secondary | ICD-10-CM | POA: Diagnosis not present

## 2022-04-22 DIAGNOSIS — R7989 Other specified abnormal findings of blood chemistry: Secondary | ICD-10-CM

## 2022-04-22 DIAGNOSIS — Z95810 Presence of automatic (implantable) cardiac defibrillator: Secondary | ICD-10-CM | POA: Diagnosis not present

## 2022-04-22 DIAGNOSIS — I4901 Ventricular fibrillation: Secondary | ICD-10-CM | POA: Diagnosis not present

## 2022-04-22 DIAGNOSIS — I5023 Acute on chronic systolic (congestive) heart failure: Secondary | ICD-10-CM | POA: Diagnosis not present

## 2022-04-22 DIAGNOSIS — G40909 Epilepsy, unspecified, not intractable, without status epilepticus: Secondary | ICD-10-CM | POA: Diagnosis present

## 2022-04-22 DIAGNOSIS — E039 Hypothyroidism, unspecified: Secondary | ICD-10-CM | POA: Diagnosis present

## 2022-04-22 DIAGNOSIS — R0602 Shortness of breath: Secondary | ICD-10-CM | POA: Diagnosis present

## 2022-04-22 DIAGNOSIS — F319 Bipolar disorder, unspecified: Secondary | ICD-10-CM | POA: Diagnosis present

## 2022-04-22 DIAGNOSIS — I110 Hypertensive heart disease with heart failure: Secondary | ICD-10-CM | POA: Diagnosis present

## 2022-04-22 DIAGNOSIS — I214 Non-ST elevation (NSTEMI) myocardial infarction: Secondary | ICD-10-CM | POA: Diagnosis not present

## 2022-04-22 DIAGNOSIS — Z79899 Other long term (current) drug therapy: Secondary | ICD-10-CM | POA: Diagnosis not present

## 2022-04-22 DIAGNOSIS — E876 Hypokalemia: Secondary | ICD-10-CM

## 2022-04-22 DIAGNOSIS — I4729 Other ventricular tachycardia: Secondary | ICD-10-CM

## 2022-04-22 DIAGNOSIS — Z7901 Long term (current) use of anticoagulants: Secondary | ICD-10-CM | POA: Diagnosis not present

## 2022-04-22 DIAGNOSIS — Z8673 Personal history of transient ischemic attack (TIA), and cerebral infarction without residual deficits: Secondary | ICD-10-CM | POA: Diagnosis not present

## 2022-04-22 LAB — CBC
HCT: 37.3 % — ABNORMAL LOW (ref 39.0–52.0)
HCT: 38.2 % — ABNORMAL LOW (ref 39.0–52.0)
Hemoglobin: 12 g/dL — ABNORMAL LOW (ref 13.0–17.0)
Hemoglobin: 12.1 g/dL — ABNORMAL LOW (ref 13.0–17.0)
MCH: 28.7 pg (ref 26.0–34.0)
MCH: 28.8 pg (ref 26.0–34.0)
MCHC: 31.7 g/dL (ref 30.0–36.0)
MCHC: 32.2 g/dL (ref 30.0–36.0)
MCV: 89.4 fL (ref 80.0–100.0)
MCV: 90.5 fL (ref 80.0–100.0)
Platelets: 277 10*3/uL (ref 150–400)
Platelets: 339 10*3/uL (ref 150–400)
RBC: 4.17 MIL/uL — ABNORMAL LOW (ref 4.22–5.81)
RBC: 4.22 MIL/uL (ref 4.22–5.81)
RDW: 17.7 % — ABNORMAL HIGH (ref 11.5–15.5)
RDW: 17.8 % — ABNORMAL HIGH (ref 11.5–15.5)
WBC: 5.7 10*3/uL (ref 4.0–10.5)
WBC: 7.2 10*3/uL (ref 4.0–10.5)
nRBC: 0 % (ref 0.0–0.2)
nRBC: 0.3 % — ABNORMAL HIGH (ref 0.0–0.2)

## 2022-04-22 LAB — COMPREHENSIVE METABOLIC PANEL
ALT: 13 U/L (ref 0–44)
AST: 27 U/L (ref 15–41)
Albumin: 3 g/dL — ABNORMAL LOW (ref 3.5–5.0)
Alkaline Phosphatase: 53 U/L (ref 38–126)
Anion gap: 10 (ref 5–15)
BUN: 11 mg/dL (ref 8–23)
CO2: 27 mmol/L (ref 22–32)
Calcium: 9.2 mg/dL (ref 8.9–10.3)
Chloride: 99 mmol/L (ref 98–111)
Creatinine, Ser: 1.2 mg/dL (ref 0.61–1.24)
GFR, Estimated: >60 mL/min (ref >60)
Glucose, Bld: 153 mg/dL — ABNORMAL HIGH (ref 70–99)
Potassium: 2.7 mmol/L — CL (ref 3.5–5.1)
Sodium: 136 mmol/L (ref 135–145)
Total Bilirubin: 0.7 mg/dL (ref 0.3–1.2)
Total Protein: 7.3 g/dL (ref 6.5–8.1)

## 2022-04-22 LAB — MAGNESIUM: Magnesium: 1.7 mg/dL (ref 1.7–2.4)

## 2022-04-22 LAB — BASIC METABOLIC PANEL
Anion gap: 9 (ref 5–15)
BUN: 12 mg/dL (ref 8–23)
CO2: 27 mmol/L (ref 22–32)
Calcium: 8.8 mg/dL — ABNORMAL LOW (ref 8.9–10.3)
Chloride: 101 mmol/L (ref 98–111)
Creatinine, Ser: 1.01 mg/dL (ref 0.61–1.24)
GFR, Estimated: >60 mL/min (ref >60)
Glucose, Bld: 176 mg/dL — ABNORMAL HIGH (ref 70–99)
Potassium: 3.7 mmol/L (ref 3.5–5.1)
Sodium: 137 mmol/L (ref 135–145)

## 2022-04-22 LAB — D-DIMER, QUANTITATIVE: D-Dimer, Quant: 1.3 ug/mL-FEU — ABNORMAL HIGH (ref 0.00–0.50)

## 2022-04-22 LAB — BRAIN NATRIURETIC PEPTIDE: B Natriuretic Peptide: 878.6 pg/mL — ABNORMAL HIGH (ref 0.0–100.0)

## 2022-04-22 LAB — TROPONIN I (HIGH SENSITIVITY)
Troponin I (High Sensitivity): 151 ng/L (ref <18)
Troponin I (High Sensitivity): 168 ng/L (ref <18)

## 2022-04-22 LAB — TSH: TSH: 4.961 u[IU]/mL — ABNORMAL HIGH (ref 0.350–4.500)

## 2022-04-22 MED ORDER — DIVALPROEX SODIUM ER 500 MG PO TB24
500.0000 mg | ORAL_TABLET | Freq: Two times a day (BID) | ORAL | Status: DC
  Administered 2022-04-22 – 2022-04-30 (×17): 500 mg via ORAL
  Filled 2022-04-22: qty 1
  Filled 2022-04-22: qty 2
  Filled 2022-04-22: qty 1
  Filled 2022-04-22 (×2): qty 2
  Filled 2022-04-22 (×10): qty 1
  Filled 2022-04-22: qty 2
  Filled 2022-04-22 (×2): qty 1

## 2022-04-22 MED ORDER — TRAMADOL HCL 50 MG PO TABS
50.0000 mg | ORAL_TABLET | Freq: Two times a day (BID) | ORAL | Status: DC | PRN
  Administered 2022-04-23: 50 mg via ORAL
  Filled 2022-04-22: qty 1

## 2022-04-22 MED ORDER — QUETIAPINE FUMARATE 25 MG PO TABS
200.0000 mg | ORAL_TABLET | Freq: Every day | ORAL | Status: DC
  Administered 2022-04-22 – 2022-04-23 (×2): 200 mg via ORAL
  Filled 2022-04-22 (×2): qty 1

## 2022-04-22 MED ORDER — HYDROXYZINE HCL 10 MG PO TABS
10.0000 mg | ORAL_TABLET | Freq: Two times a day (BID) | ORAL | Status: DC
  Administered 2022-04-22 – 2022-04-29 (×16): 10 mg via ORAL
  Filled 2022-04-22 (×17): qty 1

## 2022-04-22 MED ORDER — ENSURE ENLIVE PO LIQD
237.0000 mL | Freq: Two times a day (BID) | ORAL | Status: DC
  Administered 2022-04-22 – 2022-04-23 (×2): 237 mL via ORAL

## 2022-04-22 MED ORDER — ACETAMINOPHEN 325 MG RE SUPP: 650.0000 mg | Freq: Four times a day (QID) | RECTAL | Status: DC | PRN

## 2022-04-22 MED ORDER — PANTOPRAZOLE SODIUM 40 MG PO TBEC
40.0000 mg | DELAYED_RELEASE_TABLET | Freq: Every day | ORAL | Status: DC
  Administered 2022-04-22 – 2022-04-30 (×9): 40 mg via ORAL
  Filled 2022-04-22 (×9): qty 1

## 2022-04-22 MED ORDER — TIOTROPIUM BROMIDE MONOHYDRATE 18 MCG IN CAPS: 18.0000 ug | ORAL_CAPSULE | Freq: Every day | RESPIRATORY_TRACT | Status: DC

## 2022-04-22 MED ORDER — ALBUTEROL SULFATE (2.5 MG/3ML) 0.083% IN NEBU: 2.5000 mg | INHALATION_SOLUTION | Freq: Four times a day (QID) | RESPIRATORY_TRACT | Status: DC | PRN

## 2022-04-22 MED ORDER — LEVETIRACETAM 250 MG PO TABS
250.0000 mg | ORAL_TABLET | Freq: Two times a day (BID) | ORAL | Status: DC
  Administered 2022-04-22 – 2022-04-29 (×16): 250 mg via ORAL
  Filled 2022-04-22 (×17): qty 1

## 2022-04-22 MED ORDER — POTASSIUM CHLORIDE 10 MEQ/100ML IV SOLN
10.0000 meq | Freq: Once | INTRAVENOUS | Status: AC
  Administered 2022-04-22: 10 meq via INTRAVENOUS
  Filled 2022-04-22: qty 100

## 2022-04-22 MED ORDER — ATORVASTATIN CALCIUM 20 MG PO TABS
40.0000 mg | ORAL_TABLET | Freq: Every day | ORAL | Status: DC
  Administered 2022-04-22 – 2022-04-30 (×9): 40 mg via ORAL
  Filled 2022-04-22 (×9): qty 2

## 2022-04-22 MED ORDER — TRAZODONE HCL 50 MG PO TABS
25.0000 mg | ORAL_TABLET | Freq: Every evening | ORAL | Status: DC | PRN
  Administered 2022-04-29: 25 mg via ORAL
  Filled 2022-04-22: qty 1

## 2022-04-22 MED ORDER — SACUBITRIL-VALSARTAN 24-26 MG PO TABS
1.0000 | ORAL_TABLET | Freq: Two times a day (BID) | ORAL | Status: DC
  Administered 2022-04-22 – 2022-04-23 (×3): 1 via ORAL
  Filled 2022-04-22 (×4): qty 1

## 2022-04-22 MED ORDER — MAGNESIUM HYDROXIDE 400 MG/5ML PO SUSP: 30.0000 mL | Freq: Every day | ORAL | Status: DC | PRN

## 2022-04-22 MED ORDER — ONDANSETRON HCL 4 MG PO TABS: 4.0000 mg | ORAL_TABLET | Freq: Four times a day (QID) | ORAL | Status: DC | PRN

## 2022-04-22 MED ORDER — DIGOXIN 125 MCG PO TABS
0.1250 mg | ORAL_TABLET | Freq: Every day | ORAL | Status: DC
  Administered 2022-04-22 – 2022-04-24 (×3): 0.125 mg via ORAL
  Filled 2022-04-22 (×3): qty 1

## 2022-04-22 MED ORDER — APIXABAN 5 MG PO TABS
5.0000 mg | ORAL_TABLET | Freq: Two times a day (BID) | ORAL | Status: DC
  Administered 2022-04-22 – 2022-04-23 (×3): 5 mg via ORAL
  Filled 2022-04-22 (×3): qty 1

## 2022-04-22 MED ORDER — ASPIRIN 81 MG PO TBEC
81.0000 mg | DELAYED_RELEASE_TABLET | Freq: Every day | ORAL | Status: DC
  Administered 2022-04-22 – 2022-04-29 (×8): 81 mg via ORAL
  Filled 2022-04-22 (×8): qty 1

## 2022-04-22 MED ORDER — ADULT MULTIVITAMIN W/MINERALS CH
1.0000 | ORAL_TABLET | Freq: Every day | ORAL | Status: DC
  Administered 2022-04-22 – 2022-04-30 (×9): 1 via ORAL
  Filled 2022-04-22 (×9): qty 1

## 2022-04-22 MED ORDER — ONDANSETRON HCL 4 MG/2ML IJ SOLN: 4.0000 mg | Freq: Four times a day (QID) | INTRAMUSCULAR | Status: DC | PRN

## 2022-04-22 MED ORDER — EZETIMIBE 10 MG PO TABS
10.0000 mg | ORAL_TABLET | Freq: Every day | ORAL | Status: DC
  Administered 2022-04-22 – 2022-04-30 (×9): 10 mg via ORAL
  Filled 2022-04-22 (×9): qty 1

## 2022-04-22 MED ORDER — ESCITALOPRAM OXALATE 10 MG PO TABS
10.0000 mg | ORAL_TABLET | Freq: Every day | ORAL | Status: DC
  Administered 2022-04-22 – 2022-04-29 (×8): 10 mg via ORAL
  Filled 2022-04-22 (×8): qty 1

## 2022-04-22 MED ORDER — FUROSEMIDE 10 MG/ML IJ SOLN
40.0000 mg | Freq: Two times a day (BID) | INTRAMUSCULAR | Status: AC
  Administered 2022-04-22 (×2): 40 mg via INTRAVENOUS
  Filled 2022-04-22 (×2): qty 4

## 2022-04-22 MED ORDER — SODIUM CHLORIDE 0.9 % IV SOLN: Freq: Once | INTRAVENOUS | Status: AC

## 2022-04-22 MED ORDER — ALBUTEROL SULFATE HFA 108 (90 BASE) MCG/ACT IN AERS: 2.0000 | INHALATION_SPRAY | Freq: Four times a day (QID) | RESPIRATORY_TRACT | Status: DC | PRN

## 2022-04-22 MED ORDER — LEVOTHYROXINE SODIUM 25 MCG PO TABS
25.0000 ug | ORAL_TABLET | Freq: Every day | ORAL | Status: DC
  Administered 2022-04-22 – 2022-04-24 (×3): 25 ug via ORAL
  Filled 2022-04-22 (×3): qty 1

## 2022-04-22 MED ORDER — ACETAMINOPHEN 325 MG PO TABS: 650.0000 mg | ORAL_TABLET | Freq: Four times a day (QID) | ORAL | Status: DC | PRN

## 2022-04-22 NOTE — H&P (Addendum)
[place]   PATIENT NAME: Carl [name]    MR#:  [id number]  DATE OF BIRTH:  Oct 19, 1960  DATE OF ADMISSION:  04/22/2022  PRIMARY CARE PHYSICIAN: [facility]   Patient is coming from: Home  REQUESTING/REFERRING PHYSICIAN: Chiquita Loth, MD  CHIEF COMPLAINT:   Chief Complaint  Patient presents with   Shortness of Breath    HISTORY OF PRESENT ILLNESS:  Carl [name] is a 61 y.o. Caucasian male with medical history significant for bipolar 1 disorder, systolic CHF, COPD, depression, hypertension, coronary artery disease, seizure disorder and CVA, who presented to the emergency room with acute onset of worsening dyspnea at rest with orthopnea and lower extremity edema.  He denies any chest pain.  No fever or chills.  No nausea or vomiting or abdominal pain.  He was initially thought to have COPD exacerbation per EMS and was given Solu-Medrol and DuoNebs but was not wheezing in the ER.  No dysuria, oliguria or hematuria or flank pain.  ED Course: When he came to the ER, vital signs were remarkable for heart rate of 103 and respiratory rate of 31 and pulse oximetry was 84% on room air 96% on 3 L of O2 by nasal cannula.  Labs revealed hypokalemia of 2.7 and a blood glucose of 153 will albumin of 3 and otherwise unremarkable CMP.  High sensitive troponin was 168 and later 151 and BNP was 878.6.  CBC showed anemia.  D-dimer was 1.3.    EKG as reviewed by me : EKG showed ventricular paced rhythm with a rate of 99. Imaging: Noncontrasted CT scan revealed chronic left MCA infarct with encephalomalacia.  Portable chest ray showed cardiomegaly with mild perihilar edema and left basilar opacity likely atelectasis.  The patient was given hydration with IV normal saline and potassium replacement with 40 mill equivalent IV potassium chloride.  He will be admitted to a progressive renal doctor for the evaluation and management. 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   Seizures (HCC)    childhood   Stroke (HCC)    Tobacco abuse     PAST SURGICAL HISTORY:   Past Surgical History:  Procedure Laterality Date   BIV UPGRADE N/A 05/05/2020   Procedure: BIV ICD UPGRADE;  Surgeon: Duke Salvia, MD;  Location: [facility] INVASIVE CV LAB;  Service: Cardiovascular;  Laterality: N/A;   CORONARY ARTERY BYPASS GRAFT      SOCIAL HISTORY:   Social History   Tobacco Use   Smoking status: Every Day    Packs/day: 0.25    Years: 0.00    Total pack years: 0.00    Types: Cigarettes   Smokeless tobacco: Never  Substance Use Topics   Alcohol use: Not Currently    FAMILY HISTORY:   Family History  Problem Relation Age of Onset   Cancer Mother        "femal cancer"   Prostate cancer Father     DRUG ALLERGIES:   Allergies  Allergen Reactions  Iodinated Contrast Media Shortness Of Breath   Penicillins Anaphylaxis and Shortness Of Breath    Respiratory  Tolerated cefuroxime on 01/06/16   Strawberry Extract Anaphylaxis   Cefepime Itching    Empiric antibiotic, developed pruritis.    Erythromycin Itching   Sulfa Antibiotics Itching, Nausea And Vomiting and Nausea Only    REVIEW OF SYSTEMS:   ROS As per history of present illness. All pertinent systems were reviewed above. Constitutional, HEENT, cardiovascular, respiratory, GI, GU, musculoskeletal, neuro, psychiatric, endocrine, integumentary and hematologic systems were reviewed and are otherwise negative/unremarkable except for positive findings mentioned above in the  HPI.   MEDICATIONS AT HOME:   Prior to Admission medications   Medication Sig Start Date End Date Taking? Authorizing Provider  acetaminophen (TYLENOL) 500 MG tablet Take 500 mg by mouth every 6 (six) hours as needed for mild pain.    [provider]  albuterol (PROVENTIL) (2.5 MG/3ML) 0.083% nebulizer solution Take 3 mLs (2.5 mg total) by nebulization every 6 (six) hours as needed for wheezing or shortness of breath. 03/21/22   Jennye Boroughs, MD  albuterol (VENTOLIN HFA) 108 (90 Base) MCG/ACT inhaler Inhale 2 puffs into the lungs every 6 (six) hours as needed for wheezing. 05/24/19 04/28/22  Clapacs, Madie Reno, MD  apixaban (ELIQUIS) 5 MG TABS tablet TAKE 1 TABLET BY MOUTH TWICE A DAY 01/18/21   Deboraha Sprang, MD  atorvastatin (LIPITOR) 40 MG tablet Take 40 mg by mouth daily.    [provider]  digoxin (LANOXIN) 0.125 MG tablet Take 1 tablet (0.125 mg total) by mouth daily. 04/01/22   Annita Brod, MD  divalproex (DEPAKOTE ER) 500 MG 24 hr tablet Take 1 tablet (500 mg total) by mouth 2 (two) times daily. 05/24/19   Clapacs, Madie Reno, MD  escitalopram (LEXAPRO) 10 MG tablet Take 1 tablet (10 mg total) by mouth at bedtime. 05/24/19   Clapacs, Madie Reno, MD  ezetimibe (ZETIA) 10 MG tablet Take 1 tablet (10 mg total) by mouth daily. 04/01/22   Annita Brod, MD  furosemide (LASIX) 20 MG tablet Take 3 tablets (60 mg) by mouth once daily 04/12/22   Furth, Cadence H, PA-C  Hydrocortisone Acetate 1 % CREA SMARTSIG:sparingly Topical 3 Times Daily 12/24/21   [provider]  hydrOXYzine (ATARAX) 10 MG tablet Take 10 mg by mouth 2 (two) times daily. 12/24/21   [provider]  levETIRAcetam (KEPPRA) 250 MG tablet Take 1 tablet (250 mg total) by mouth 2 (two) times daily. 03/24/20   Harvest Dark, MD  levothyroxine (SYNTHROID) 25 MCG tablet Take 1 tablet (25 mcg total) by mouth daily. 05/24/19   Clapacs, Madie Reno, MD  omeprazole (PRILOSEC) 20 MG capsule Take 20 mg by  mouth daily.    [provider]  predniSONE (DELTASONE) 10 MG tablet Take 1 tablet (10 mg total) by mouth daily with breakfast. 04/02/22   Annita Brod, MD  QUEtiapine (SEROQUEL) 200 MG tablet Take 200 mg by mouth at bedtime.    [provider]  tiotropium (SPIRIVA) 18 MCG inhalation capsule Place 18 mcg into inhaler and inhale daily.    [provider]  traMADol (ULTRAM) 50 MG tablet Take 1 tablet (50 mg total) by mouth 2 (two) times daily as needed. 03/31/22   Annita Brod, MD      VITAL SIGNS:  Blood pressure 130/71, pulse 83, resp. rate 20, height 5\' 9"  (1.753 m), SpO2 98 %.  PHYSICAL EXAMINATION:  Physical Exam  GENERAL:  61 y.o.-year-old Caucasian male patient lying in the bed with mild respiratory distress with conversational dyspnea. EYES: Pupils equal, round, reactive to light and accommodation. No scleral icterus. Extraocular muscles intact.  HEENT: Head atraumatic, normocephalic. Oropharynx and nasopharynx clear.  NECK:  Supple, no jugular venous distention. No thyroid enlargement, no tenderness.  LUNGS: Diminished at bibasilar breath sounds with bibasilar rales.. No use of accessory muscles of respiration.  CARDIOVASCULAR: Regular rate and rhythm, S1, S2 normal. No murmurs, rubs, or gallops.  ABDOMEN: Soft, nondistended, nontender. Bowel sounds present. No organomegaly or mass.  EXTREMITIES: 2+ bilateral lower extremity pitting edema with no cyanosis, or clubbing.  NEUROLOGIC: Cranial nerves II through XII are intact. Muscle strength 5/5 in all extremities. Sensation intact. Gait not checked.  PSYCHIATRIC: The patient is alert and oriented x 3.  Normal affect and good eye contact. SKIN: No obvious rash, lesion, or ulcer.   LABORATORY PANEL:   CBC Recent Labs  Lab 04/21/22 2357  WBC 7.2  HGB 12.0*  HCT 37.3*  PLT 339    ------------------------------------------------------------------------------------------------------------------  Chemistries  Recent Labs  Lab 04/21/22 2357  NA 136  K 2.7*  CL 99  CO2 27  GLUCOSE 153*  BUN 11  CREATININE 1.20  CALCIUM 9.2  AST 27  ALT 13  ALKPHOS 53  BILITOT 0.7   ------------------------------------------------------------------------------------------------------------------  Cardiac Enzymes No results for input(s): "TROPONINI" in the last 168 hours. ------------------------------------------------------------------------------------------------------------------  RADIOLOGY:  CT Head Wo Contrast  Result Date: 04/22/2022 CLINICAL DATA:  Altered mental status EXAM: CT HEAD WITHOUT CONTRAST TECHNIQUE: Contiguous axial images were obtained from the base of the skull through the vertex without intravenous contrast. RADIATION DOSE REDUCTION: This exam was performed according to the departmental dose-optimization program which includes automated exposure control, adjustment of the mA and/or kV according to patient size and/or use of iterative reconstruction technique. COMPARISON:  03/14/2022 FINDINGS: Brain: Mild atrophic changes and chronic white matter ischemic changes are seen. There is diffuse encephalomalacia in the left parietooccipital and temporal lobes consistent with prior infarct. These are stable from the prior study. No acute hemorrhage, acute infarction or space-occupying mass lesion is seen. Vascular: No hyperdense vessel or unexpected calcification. Skull: Normal. Negative for fracture or focal lesion. Sinuses/Orbits: No acute finding. Other: None. IMPRESSION: Chronic left MCA infarct with encephalomalacia. No acute abnormality noted. Electronically Signed   By: Alcide Clever M.D.   On: 04/22/2022 02:05   DG Chest Port 1 View  Result Date: 04/22/2022 CLINICAL DATA:  Shortness of breath EXAM: PORTABLE CHEST 1 VIEW COMPARISON:  04/12/2022 FINDINGS:  Cardiomegaly with mild perihilar edema. Mild left basilar opacity, likely atelectasis. No definite pleural effusions. No pneumothorax. Valve prosthesis. Left subclavian ICD. Postsurgical changes related to prior CABG. Median sternotomy. IMPRESSION: Cardiomegaly with mild perihilar edema. Left basilar opacity, likely atelectasis. Electronically Signed   By: Charline Bills M.D.   On: 04/22/2022 00:11      IMPRESSION AND PLAN:  Assessment and Plan: Acute respiratory failure with hypoxia (HCC) - The patient will be admitted to a medical a progressive unit bed. - O2 protocol will be followed. - This is clearly secondary to acute on chronic systolic CHF. - Management otherwise as below.  Acute on chronic HFrEF (heart failure with reduced ejection fraction) (HCC) - The patient will be diuresed with IV Lasix. - We will follow serial troponins. - His elevated troponins are likely due to acute CHF. - Strict I's and O's and daily weights.  Hypokalemia - Potassium will  be replaced and magnesium level will be checked.  Hypothyroidism -We will Continue Synthroid and check TSH level.  Bipolar 1 disorder (HCC) - We will continue Seroquel and Lexapro.  Paroxysmal atrial fibrillation (HCC) - We will continue Eliquis and digoxin  Seizure (HCC) - We will continue Depakote ER and Keppra.  Dyslipidemia - We will continue statin therapy.       DVT prophylaxis: Eliquis Advanced Care Planning:  Code Status: full code. Family Communication:  The plan of care was discussed in details with the patient (and family). I answered all questions. The patient agreed to proceed with the above mentioned plan. Further management will depend upon hospital course. Disposition Plan: Back to previous home environment Consults called: none. All the records are reviewed and case discussed with ED provider.  Status is: Inpatient    At the time of the admission, it appears that the appropriate admission  status for this patient is inpatient.  This is judged to be reasonable and necessary in order to provide the required intensity of service to ensure the patient's safety given the presenting symptoms, physical exam findings and initial radiographic and laboratory data in the context of comorbid conditions.  The patient requires inpatient status due to high intensity of service, high risk of further deterioration and high frequency of surveillance required.  I certify that at the time of admission, it is my clinical judgment that the patient will require inpatient hospital care extending more than 2 midnights.                            Dispo: The patient is from: Home              Anticipated d/c is to: Home              Patient currently is not medically stable to d/c.              Difficult to place patient: No  Hannah Beat M.D on 04/22/2022 at 5:55 AM  [facility]   From 7 PM-7 AM, contact night-coverage [email or web address]  CC: Primary care physician; [facility]

## 2022-04-22 NOTE — Assessment & Plan Note (Signed)
--  monitor and replete PRN ?

## 2022-04-22 NOTE — Assessment & Plan Note (Signed)
--  In the ED, respiratory rate of 31 and pulse oximetry was 84% on room air, 96% on 3 L of O2 by nasal cannula. --2/2 CHF exacerbation.  CXR showed Cardiomegaly with mild perihilar edema.   Plan: --cont diuresis --Continue supplemental O2 to keep sats >=90%, wean as tolerated

## 2022-04-22 NOTE — Assessment & Plan Note (Signed)
-  We will Continue Synthroid and check TSH level.

## 2022-04-22 NOTE — Assessment & Plan Note (Signed)
-  continue Depakote ER and Keppra.

## 2022-04-22 NOTE — Telephone Encounter (Signed)
Received the following transmission alert from [employer]:  Device alert for VF with successful therapy Currently in the ED for SOB, hypokalemia, and elevated troponis VF event occurred 10/15 @ 04:49, EGM shows AF with sustained VT, ATP delivered x1, rhythm deteriorates to VF, rate 261, HV therapy delivered 35J converting rhythm  Known AF, burden 100%%, Eliquis, Coreg Route to triage for awareness LA   NOTE:  Patient currently at [facility] since last night. Per ER physician:  Differential includes, but is not limited to, viral syndrome, bronchitis including COPD exacerbation, pneumonia, reactive airway disease including asthma, CHF including exacerbation with or without pulmonary/interstitial edema, pneumothorax, ACS, thoracic trauma, and pulmonary embolism.  I have personally reviewed patient's records and note a hospitalization 03/22/2022 for CHF and COPD exacerbation; he tested positive for COVID-19 at that time.  FYI to Dr. Caryl Comes.

## 2022-04-22 NOTE — Assessment & Plan Note (Addendum)
-   continue statin therapy. 

## 2022-04-22 NOTE — Progress Notes (Signed)
Initial Nutrition Assessment  DOCUMENTATION CODES:   Not applicable  INTERVENTION:   -Liberalize diet to 2 gram sodium for wider variety of meal selections -MVI with minerals daily -Ensure Enlive po BID, each supplement provides 350 kcal and 20 grams of protein  NUTRITION DIAGNOSIS:   Increased nutrient needs related to chronic illness (CHF) as evidenced by estimated needs.  GOAL:   Patient will meet greater than or equal to 90% of their needs  MONITOR:   PO intake, Supplement acceptance  REASON FOR ASSESSMENT:   Rounds    ASSESSMENT:   Pt with medical history significant for bipolar 1 disorder, systolic CHF, COPD, depression, hypertension, coronary artery disease, seizure disorder and CVA, who presented with acute onset of worsening dyspnea at rest with orthopnea and lower extremity edema.  Pt admitted with acute respiratory failure and CHF exacerbation  Reviewed I/O's: +97 ml x 24 hours  Pt unavailable at time of visit. RD unable to obtain further nutrition-related history or complete nutrition-focused physical exam at this time.    Pt familiar to this RD from multiple previous admissions. Pt typically has a good appetite and lives at [place]. Pt is currently on a heart healthy diet. No meal completion data available to assess at this time.   Reviewed wt hx; wt has been stable over the past 2 months.   Medications reviewed and include lasix and keppra.   Lab Results  Component Value Date   HGBA1C 6.6 (H) 03/25/2022   PTA DM medications are none.   Labs reviewed: CBGS: 182 (inpatient orders for glycemic control are none).    Diet Order:   Diet Order             Diet Heart Room service appropriate? Yes; Fluid consistency: Thin  Diet effective now                   EDUCATION NEEDS:   No education needs have been identified at this time  Skin:  Skin Assessment: Reviewed RN Assessment  Last BM:  Unknown  Height:   Ht Readings from Last  1 Encounters:  04/21/22 5\' 9"  (1.753 m)    Weight:   Wt Readings from Last 1 Encounters:  04/12/22 88.9 kg    Ideal Body Weight:  72.7 kg  BMI:  Body mass index is 28.94 kg/m.  Estimated Nutritional Needs:   Kcal:  2000-2200  Protein:  105-120 grams  Fluid:  > 2 L    Loistine Chance, RD, LDN, [place] Registered Dietitian II Certified Diabetes Care and Education Specialist Please refer to [facility] for RD and/or RD on-call/weekend/after hours pager

## 2022-04-22 NOTE — Telephone Encounter (Signed)
Antony Madura, PA-C  Sent: Mon April 22, 2022 10:49 AM  To: Emily Filbert, RN          Message  [place], thank you

## 2022-04-22 NOTE — Assessment & Plan Note (Addendum)
-   continue Seroquel and Lexapro.

## 2022-04-22 NOTE — ED Notes (Signed)
Please send the chocolate Ensure for the patient. Vanilla flavor makes the patient gag.

## 2022-04-22 NOTE — Assessment & Plan Note (Addendum)
--  LVEF 25-30%, with ICD in place.  CXR showed Cardiomegaly with mild perihilar edema.   Plan: --continue IV lasix 40 mg BID --cardiology consult today --add Toprol and losartan by cardio

## 2022-04-22 NOTE — ED Provider Notes (Signed)
[facility] Provider Note    Event Date/Time   First MD Initiated Contact with Patient 04/22/22 0110     (approximate)   History   Shortness of Breath   HPI  Level V caveat: Limited by somnolence  Carl [name] is a 61 y.o. male brought [facility] the ED via EMS [facility] with a chief complaint of shortness of breath.  Patient with a history of COPD, CHF not on chronic oxygen.  He was given 125 mg IV Solu-Medrol and 2 DuoNeb's for respiratory distress and wheezing.  Rest of history is limited as patient is somnolent.     Past Medical History   Past Medical History:  Diagnosis Date  . Acute pulmonary edema (HCC) 2017  . Acute respiratory failure with hypoxia (HCC) 2017  . Bipolar 1 disorder (HCC)   . CHF (congestive heart failure) (HCC)   . Chronic kidney disease   . COPD (chronic obstructive pulmonary disease) (HCC)   . Depression   . Hypertension   . Myocardial infarction (HCC)   . NSTEMI (non-ST elevated myocardial infarction) (HCC)   . 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 Seizures (HCC)    childhood  . Stroke (HCC)   . Tobacco abuse      Active Problem List   Patient Active Problem List   Diagnosis Date Noted  . Demand ischemia   . Type 2 diabetes mellitus with hyperglycemia (HCC) 03/25/2022  . Overweight (BMI 25.0-29.9) 03/23/2022  . COVID-19 virus infection 03/23/2022  . Respiratory failure (HCC) 03/22/2022  . Dilated cardiomyopathy (HCC)   . Chest pain   . COPD exacerbation (HCC) 03/14/2022  . Seizure disorder (HCC) 03/14/2022  . Dyspnea   . Congestive heart failure (HCC) 02/15/2022  . Anxiety 02/15/2022  . Tobacco use 02/15/2022  . Elevated partial thromboplastin time (PTT) 02/15/2022  . Abdominal pain 02/15/2022  . HFrEF (heart failure with reduced ejection fraction) (HCC)   . Weakness   . COPD with acute exacerbation (HCC) 05/23/2021  . Paroxysmal atrial fibrillation (HCC)  05/23/2021  . Chronic anticoagulation 05/23/2021  . Abdominal aortic aneurysm (AAA) 35 to 39 mm in diameter (HCC) 05/23/2021  . Ischemic cardiomyopathy 05/22/2020  . Chronic systolic heart failure (HCC) 05/22/2020  . HCAP (healthcare-associated pneumonia) 01/24/2020  . Hyperkalemia 01/24/2020  . Acute on chronic combined systolic (congestive) and diastolic (congestive) heart failure (HCC) 01/24/2020  . Seizure (HCC) 01/24/2020  . HLD (hyperlipidemia) 01/24/2020  . Ascites due to alcoholic cirrhosis (HCC)   . 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 Elevated troponin 11/18/2019  . Hypoxia 11/18/2019  . Coronary artery disease 05/15/2019  . History of stroke 05/15/2019  . Homicidal ideation 05/15/2019  . Hypothyroidism 05/15/2019  . Constipation 05/06/2019  . Scrotal mass 05/06/2019  . Abnormal urine odor 05/06/2019  . Bipolar 1 disorder (HCC) 12/23/2018  . Chronic pain of left knee 09/17/2018  . Atypical chest pain 10/11/2017  . Polypharmacy 08/05/2017  . Cerebrovascular accident (CVA) due to stenosis of left middle cerebral artery (HCC) 06/26/2017  . Urinary retention due to benign prostatic hyperplasia 06/22/2017  . Falls 06/20/2017  . Risk for falls 02/19/2017  . Dyslipidemia 11/19/2016  . Peripheral artery disease (HCC) 08/28/2016  . Claudication (HCC) 08/09/2016  . History of alcohol abuse 08/09/2016  . History of drug dependence/abuse (HCC) 08/09/2016  . Severe single current episode of major depressive disorder, without psychotic features (HCC) 08/09/2016  . Mitral insufficiency 01/04/2016  .  Prediabetes 12/29/2015  . Essential hypertension 12/29/2015  . Fracture of left clavicle 07/13/2012     Past Surgical History   Past Surgical History:  Procedure Laterality Date  . BIV UPGRADE N/A 05/05/2020   Procedure: BIV ICD UPGRADE;  Surgeon: Duke Salvia, MD;  Location: [facility] INVASIVE CV LAB;  Service: Cardiovascular;  Laterality: N/A;  .  CORONARY ARTERY BYPASS GRAFT       Home Medications   Prior to Admission medications   Medication Sig Start Date End Date Taking? Authorizing Provider  acetaminophen (TYLENOL) 500 MG tablet Take 500 mg by mouth every 6 (six) hours as needed for mild pain.    [provider]  albuterol (PROVENTIL) (2.5 MG/3ML) 0.083% nebulizer solution Take 3 mLs (2.5 mg total) by nebulization every 6 (six) hours as needed for wheezing or shortness of breath. 03/21/22   Lurene Shadow, MD  albuterol (VENTOLIN HFA) 108 (90 Base) MCG/ACT inhaler Inhale 2 puffs into the lungs every 6 (six) hours as needed for wheezing. 05/24/19 04/28/22  Clapacs, Jackquline Denmark, MD  apixaban (ELIQUIS) 5 MG TABS tablet TAKE 1 TABLET BY MOUTH TWICE A DAY 01/18/21   Duke Salvia, MD  atorvastatin (LIPITOR) 40 MG tablet Take 40 mg by mouth daily.    [provider]  digoxin (LANOXIN) 0.125 MG tablet Take 1 tablet (0.125 mg total) by mouth daily. 04/01/22   Hollice Espy, MD  divalproex (DEPAKOTE ER) 500 MG 24 hr tablet Take 1 tablet (500 mg total) by mouth 2 (two) times daily. 05/24/19   Clapacs, Jackquline Denmark, MD  escitalopram (LEXAPRO) 10 MG tablet Take 1 tablet (10 mg total) by mouth at bedtime. 05/24/19   Clapacs, Jackquline Denmark, MD  ezetimibe (ZETIA) 10 MG tablet Take 1 tablet (10 mg total) by mouth daily. 04/01/22   Hollice Espy, MD  furosemide (LASIX) 20 MG tablet Take 3 tablets (60 mg) by mouth once daily 04/12/22   Furth, Cadence H, PA-C  Hydrocortisone Acetate 1 % CREA SMARTSIG:sparingly Topical 3 Times Daily 12/24/21   [provider]  hydrOXYzine (ATARAX) 10 MG tablet Take 10 mg by mouth 2 (two) times daily. 12/24/21   [provider]  levETIRAcetam (KEPPRA) 250 MG tablet Take 1 tablet (250 mg total) by mouth 2 (two) times daily. 03/24/20   Minna Antis, MD  levothyroxine (SYNTHROID) 25 MCG tablet Take 1 tablet (25 mcg total) by mouth daily. 05/24/19   Clapacs, Jackquline Denmark, MD  omeprazole (PRILOSEC) 20  MG capsule Take 20 mg by mouth daily.    [provider]  predniSONE (DELTASONE) 10 MG tablet Take 1 tablet (10 mg total) by mouth daily with breakfast. 04/02/22   Hollice Espy, MD  QUEtiapine (SEROQUEL) 200 MG tablet Take 200 mg by mouth at bedtime.    [provider]  tiotropium (SPIRIVA) 18 MCG inhalation capsule Place 18 mcg into inhaler and inhale daily.    [provider]  traMADol (ULTRAM) 50 MG tablet Take 1 tablet (50 mg total) by mouth 2 (two) times daily as needed. 03/31/22   Hollice Espy, MD     Allergies  Iodinated contrast media, Penicillins, Strawberry extract, Cefepime, Erythromycin, and Sulfa antibiotics   Family History   Family History  Problem Relation Age of Onset  . Cancer Mother        "femal cancer"  . Prostate cancer Father      Physical Exam  Triage Vital Signs: ED Triage Vitals  Enc Vitals Group  BP --      Pulse Rate 04/21/22 2353 (!) 103     Resp 04/21/22 2353 20     Temp --      Temp Source 04/21/22 2353 Oral     SpO2 04/21/22 2353 96 %     Weight --      Height 04/21/22 2349 5\' 9"  (1.753 m)     Head Circumference --      Peak Flow --      Pain Score 04/21/22 2349 0     Pain Loc --      Pain Edu? --      Excl. in [place]? --     Updated Vital Signs: Pulse (!) 103   Resp 20   Ht 5\' 9"  (1.753 m)   SpO2 96%   BMI 28.94 kg/m    General: Somnolent, difficult to arouse, no distress.  CV:  Tachycardic.  Good peripheral perfusion.  Resp:  Normal effort.  Diminished aeration. Abd:  Nontender.  No distention.  Other:  Awakens briefly to loud voices.   ED Results / Procedures / Treatments  Labs (all labs ordered are listed, but only abnormal results are displayed) Labs Reviewed  CBC - Abnormal; Notable for the following components:      Result Value   RBC 4.17 (*)    Hemoglobin 12.0 (*)    HCT 37.3 (*)    RDW 17.7 (*)    nRBC 0.3 (*)    All other components within normal limits  COMPREHENSIVE  METABOLIC PANEL - Abnormal; Notable for the following components:   Potassium 2.7 (*)    Glucose, Bld 153 (*)    Albumin 3.0 (*)    All other components within normal limits  BLOOD GAS, ARTERIAL - Abnormal; Notable for the following components:   pH, Arterial 7.51 (*)    Bicarbonate 32.7 (*)    Acid-Base Excess 8.8 (*)    All other components within normal limits  TROPONIN I (HIGH SENSITIVITY) - Abnormal; Notable for the following components:   Troponin I (High Sensitivity) 168 (*)    All other components within normal limits  TROPONIN I (HIGH SENSITIVITY)     EKG  ED ECG REPORT I, Rosemarie Galvis J, the attending physician, personally viewed and interpreted this ECG.   Date: 04/22/2022  EKG Time: 2350  Rate: 99  Rhythm: Paced  Axis: Paced  Intervals: Paced  ST&T Change: Paced    RADIOLOGY I have independently visualized and interpreted patient's CT and chest x-ray as well as noted the radiology interpretation:  CT head: No ICH  Chest x-ray: Cardiomegaly, mild edema, left basilar opacity likely atelectasis  Official radiology report(s): CT Head Wo Contrast  Result Date: 04/22/2022 CLINICAL DATA:  Altered mental status EXAM: CT HEAD WITHOUT CONTRAST TECHNIQUE: Contiguous axial images were obtained from the base of the skull through the vertex without intravenous contrast. RADIATION DOSE REDUCTION: This exam was performed according to the departmental dose-optimization program which includes automated exposure control, adjustment of the mA and/or kV according to patient size and/or use of iterative reconstruction technique. COMPARISON:  03/14/2022 FINDINGS: Brain: Mild atrophic changes and chronic white matter ischemic changes are seen. There is diffuse encephalomalacia in the left parietooccipital and temporal lobes consistent with prior infarct. These are stable from the prior study. No acute hemorrhage, acute infarction or space-occupying mass lesion is seen. Vascular: No  hyperdense vessel or unexpected calcification. Skull: Normal. Negative for fracture or focal lesion. Sinuses/Orbits: No acute finding. Other:  None. IMPRESSION: Chronic left MCA infarct with encephalomalacia. No acute abnormality noted. Electronically Signed   By: Alcide Clever M.D.   On: 04/22/2022 02:05   DG Chest Port 1 View  Result Date: 04/22/2022 CLINICAL DATA:  Shortness of breath EXAM: PORTABLE CHEST 1 VIEW COMPARISON:  04/12/2022 FINDINGS: Cardiomegaly with mild perihilar edema. Mild left basilar opacity, likely atelectasis. No definite pleural effusions. No pneumothorax. Valve prosthesis. Left subclavian ICD. Postsurgical changes related to prior CABG. Median sternotomy. IMPRESSION: Cardiomegaly with mild perihilar edema. Left basilar opacity, likely atelectasis. Electronically Signed   By: Charline Bills M.D.   On: 04/22/2022 00:11     PROCEDURES:  Critical Care performed: Yes, see critical care procedure note(s)  CRITICAL CARE Performed by: Irean Hong   Total critical care time: 30 minutes  Critical care time was exclusive of separately billable procedures and treating other patients.  Critical care was necessary to treat or prevent imminent or life-threatening deterioration.  Critical care was time spent personally by me on the following activities: development of treatment plan with patient and/or surrogate as well as nursing, discussions with consultants, evaluation of patient's response to treatment, examination of patient, obtaining history from patient or surrogate, ordering and performing treatments and interventions, ordering and review of laboratory studies, ordering and review of radiographic studies, pulse oximetry and re-evaluation of patient's condition.   [name]1-3 Lead EKG Interpretation  Performed by: Irean Hong, MD Authorized by: Irean Hong, MD     Interpretation: abnormal     ECG rate:  103   ECG rate assessment: tachycardic     Rhythm: sinus tachycardia      Ectopy: none     Conduction: normal   Comments:     Patient placed on cardiac monitor to evaluate for arrhythmias    MEDICATIONS ORDERED IN ED: Medications  potassium chloride 10 mEq in 100 mL IVPB (has no administration in time range)  potassium chloride 10 mEq in 100 mL IVPB (has no administration in time range)  potassium chloride 10 mEq in 100 mL IVPB (has no administration in time range)  potassium chloride 10 mEq in 100 mL IVPB (has no administration in time range)     IMPRESSION / MDM / ASSESSMENT AND PLAN / ED COURSE  I reviewed the triage vital signs and the nursing notes.                             61 year old male presenting with shortness of breath. Differential includes, but is not limited to, viral syndrome, bronchitis including COPD exacerbation, pneumonia, reactive airway disease including asthma, CHF including exacerbation with or without pulmonary/interstitial edema, pneumothorax, ACS, thoracic trauma, and pulmonary embolism.  I have personally reviewed patient's records and note a hospitalization 03/22/2022 for CHF and COPD exacerbation; he tested positive for COVID-19 at that time.  Patient's presentation is most consistent with acute presentation with potential threat to life or bodily function.  The patient is on the cardiac monitor to evaluate for evidence of arrhythmia and/or significant heart rate changes.  Laboratory results demonstrate normal WBC 7.2, hypokalemia with potassium 2.7, troponin 168 which is decreased from 1 month ago of 1438.  Given degree of somnolence, will obtain CT head to evaluate for ICH as well as ABG to evaluate hypercarbia.  Will administer runs of IV potassium.  Troponin is decreased from prior; will repeat troponin and consider heparin if it elevates.  Anticipate hospitalization.  Clinical Course  as of 04/22/22 0549  Mon Apr 22, 2022  0209 ABG 7.5 1/41/83; CT head negative for ICH.  Will consult hospital services for evaluation  and admission. [JS]  0506 Repeat troponin trending downwards.  Hospitalist requested D-dimer; it is positive at 1.30.  I have relayed this to the hospitalist team. [JS]    Clinical Course User Index [JS] Irean Hong, MD     FINAL CLINICAL IMPRESSION(S) / ED DIAGNOSES   Final diagnoses:  Shortness of breath  Hypokalemia  Elevated troponin     Rx / DC Orders   ED Discharge Orders     None        Note:  This document was prepared using Dragon voice recognition software and may include unintentional dictation errors.   Irean Hong, MD 04/22/22 [id number]

## 2022-04-22 NOTE — Assessment & Plan Note (Signed)
--  cont digoxin --add Toprol by cardio --start heparin gtt in place of Eliquis in anticipation of heart cath later on

## 2022-04-23 DIAGNOSIS — I5023 Acute on chronic systolic (congestive) heart failure: Secondary | ICD-10-CM

## 2022-04-23 DIAGNOSIS — J9601 Acute respiratory failure with hypoxia: Secondary | ICD-10-CM | POA: Diagnosis not present

## 2022-04-23 DIAGNOSIS — I4729 Other ventricular tachycardia: Secondary | ICD-10-CM

## 2022-04-23 DIAGNOSIS — I480 Paroxysmal atrial fibrillation: Secondary | ICD-10-CM

## 2022-04-23 DIAGNOSIS — I472 Ventricular tachycardia, unspecified: Secondary | ICD-10-CM | POA: Insufficient documentation

## 2022-04-23 DIAGNOSIS — E876 Hypokalemia: Secondary | ICD-10-CM | POA: Diagnosis not present

## 2022-04-23 LAB — BASIC METABOLIC PANEL
Anion gap: 7 (ref 5–15)
BUN: 21 mg/dL (ref 8–23)
CO2: 30 mmol/L (ref 22–32)
Calcium: 8.9 mg/dL (ref 8.9–10.3)
Chloride: 102 mmol/L (ref 98–111)
Creatinine, Ser: 1.03 mg/dL (ref 0.61–1.24)
GFR, Estimated: >60 mL/min (ref >60)
Glucose, Bld: 144 mg/dL — ABNORMAL HIGH (ref 70–99)
Potassium: 3.1 mmol/L — ABNORMAL LOW (ref 3.5–5.1)
Sodium: 139 mmol/L (ref 135–145)

## 2022-04-23 LAB — CBC
HCT: 38.3 % — ABNORMAL LOW (ref 39.0–52.0)
Hemoglobin: 12.2 g/dL — ABNORMAL LOW (ref 13.0–17.0)
MCH: 29 pg (ref 26.0–34.0)
MCHC: 31.9 g/dL (ref 30.0–36.0)
MCV: 91 fL (ref 80.0–100.0)
Platelets: 326 10*3/uL (ref 150–400)
RBC: 4.21 MIL/uL — ABNORMAL LOW (ref 4.22–5.81)
RDW: 17.7 % — ABNORMAL HIGH (ref 11.5–15.5)
WBC: 9.7 10*3/uL (ref 4.0–10.5)
nRBC: 0 % (ref 0.0–0.2)

## 2022-04-23 LAB — MAGNESIUM: Magnesium: 1.7 mg/dL (ref 1.7–2.4)

## 2022-04-23 LAB — DIGOXIN LEVEL: Digoxin Level: 0.6 ng/mL — ABNORMAL LOW (ref 0.8–2.0)

## 2022-04-23 MED ORDER — LOSARTAN POTASSIUM 25 MG PO TABS
12.5000 mg | ORAL_TABLET | Freq: Every day | ORAL | Status: DC
  Administered 2022-04-24 – 2022-04-30 (×7): 12.5 mg via ORAL
  Filled 2022-04-23 (×7): qty 1

## 2022-04-23 MED ORDER — FUROSEMIDE 10 MG/ML IJ SOLN
40.0000 mg | Freq: Two times a day (BID) | INTRAMUSCULAR | Status: AC
  Administered 2022-04-23 (×2): 40 mg via INTRAVENOUS
  Filled 2022-04-23 (×2): qty 4

## 2022-04-23 MED ORDER — METOPROLOL SUCCINATE ER 25 MG PO TB24
12.5000 mg | ORAL_TABLET | Freq: Every day | ORAL | Status: DC
  Administered 2022-04-23 – 2022-04-29 (×7): 12.5 mg via ORAL
  Filled 2022-04-23: qty 0.5
  Filled 2022-04-23 (×6): qty 1

## 2022-04-23 MED ORDER — POTASSIUM CHLORIDE CRYS ER 20 MEQ PO TBCR
40.0000 meq | EXTENDED_RELEASE_TABLET | ORAL | Status: AC
  Administered 2022-04-23 (×2): 40 meq via ORAL
  Filled 2022-04-23 (×2): qty 2

## 2022-04-23 MED ORDER — HEPARIN (PORCINE) 25000 UT/250ML-% IV SOLN
1150.0000 [IU]/h | INTRAVENOUS | Status: DC
  Administered 2022-04-23: 1300 [IU]/h via INTRAVENOUS
  Administered 2022-04-24: 1150 [IU]/h via INTRAVENOUS
  Filled 2022-04-23 (×2): qty 250

## 2022-04-23 MED ORDER — ENSURE ENLIVE PO LIQD
237.0000 mL | Freq: Two times a day (BID) | ORAL | Status: DC
  Administered 2022-04-23 – 2022-04-30 (×10): 237 mL via ORAL

## 2022-04-23 MED ORDER — TIOTROPIUM BROMIDE MONOHYDRATE 18 MCG IN CAPS
18.0000 ug | ORAL_CAPSULE | Freq: Every day | RESPIRATORY_TRACT | Status: DC
  Administered 2022-04-24 – 2022-04-30 (×7): 18 ug via RESPIRATORY_TRACT
  Filled 2022-04-23 (×2): qty 5

## 2022-04-23 NOTE — ED Notes (Signed)
Pt refused dinner tray

## 2022-04-23 NOTE — ED Notes (Signed)
Pt oob to BR with 1 assist. 

## 2022-04-23 NOTE — Progress Notes (Signed)
  PROGRESS NOTE    Carl [name]  OAC:[id number] DOB: 1961/04/12 DOA: 04/22/2022 PCP: [facility]  [id number]  LOS: 1 day   Brief hospital course:   Assessment & Plan: Carl [name] is a 61 y.o. Caucasian male with medical history significant for bipolar 1 disorder, systolic CHF, COPD, depression, hypertension, coronary artery disease, seizure disorder and CVA, who presented to the emergency room with acute onset of worsening dyspnea at rest with orthopnea and lower extremity edema.     * Acute respiratory failure with hypoxia (HCC) --In the ED, respiratory rate of 31 and pulse oximetry was 84% on room air, 96% on 3 L of O2 by nasal cannula. --2/2 CHF exacerbation.  CXR showed Cardiomegaly with mild perihilar edema.   Plan: --cont diuresis --Continue supplemental O2 to keep sats >=90%, wean as tolerated  NSVT (nonsustained ventricular tachycardia) ([place]) --A note from 10/16 documented Device alert for VF with successful therapy.  Tele showed pt having frequent short stretches of VTach. Plan: --device interrogation today --cardiology consult today  Acute on chronic HFrEF (heart failure with reduced ejection fraction) (HCC) --LVEF 25-30%, with ICD in place.  CXR showed Cardiomegaly with mild perihilar edema.   Plan: --continue IV lasix 40 mg BID --cont Toprol, losartan --cardiology consult today   Hypokalemia --monitor and replete PRN  Hypothyroidism -Continue Synthroid  Bipolar 1 disorder ([place]) - continue Seroquel and Lexapro.  Paroxysmal atrial fibrillation (HCC) - continue Eliquis  --cont digoxin and Toprol  History of seizure -continue Depakote ER and Keppra.  Dyslipidemia -continue statin therapy.   DVT prophylaxis: [id number] gtt Code Status: Full code  Family Communication:  Level of care: Progressive Dispo:   The patient is from: home Anticipated d/c is to: home Anticipated d/c date is: 2-3 days Patient currently is not medically ready  to d/c due to: Vtach pending cardio/EP eval, on IV lasix   Subjective and Interval History:  I was not able to get pt to answer questions as pt kept talking on his own about unrelated topics.    Pt went into frequent short-stretches of Vtach, per tele.     Objective: Vitals:   04/23/22 1215 04/23/22 1228 04/23/22 1625 04/23/22 1625  BP: (!) 87/51   95/61  Pulse: 75   78  Resp: 19   18  Temp:  97.7 F (36.5 C) 97.7 F (36.5 C) 97.7 F (36.5 C)  TempSrc:  Oral Oral Oral  SpO2: 97%   96%  Weight:      Height:        Intake/Output Summary (Last 24 hours) at 04/23/2022 2001 Last data filed at 04/23/2022 1855 Gross per 24 hour  Intake --  Output 850 ml  Net -850 ml   Filed Weights   04/23/22 0835  Weight: 86.5 kg    Examination:   Constitutional: NAD, alert, oriented to self, but otherwise incoherent  HEENT: conjunctivae and lids normal, EOMI CV: No cyanosis.   RESP: normal respiratory effort, no wheezes Extremities: Mild edema in BLE SKIN: warm, dry Neuro: II - XII grossly intact.     Data Reviewed: I have personally reviewed labs and imaging studies  Time spent: 50 minutes  Enzo Bi, MD [facility] If 7PM-7AM, please contact night-coverage 04/23/2022, 8:01 PM

## 2022-04-23 NOTE — ED Notes (Signed)
Oxygen saturation decreased to 87% on RA. Placed on 2L per [place]. Mustache trimmed by Lorriane Shire, RN per patient request.

## 2022-04-23 NOTE — Consult Note (Signed)
Cardiology Consultation   Patient ID: Carl [name] MRN: [id number]; DOB: [birth year]  Admit date: 04/22/2022 Date of Consult: 04/23/2022  PCP:  Orvis Brill, [place] [place] [facility] Cardiologist:  Kathlyn Sacramento, MD  Electrophysiologist:  Virl Axe, MD  {   Patient Profile:   Carl [name] is a 61 y.o. male with a hx of CAD s/p CABG in 2017, ICM EF 30-35% s/p ICD in 2017, COPD, paroxysmal Afib on Eliquis, CVA, bipolar disorder, tobacco use who is being seen 04/23/2022 for the evaluation of ICD shock at the request of Dr. Billie Ruddy.  History of Present Illness:   Carl [name] was hospitalized in May 2021 with respiratory distress in the setting of COPD and heart failure.  Echo showed LVEF 25 to 30%.  He underwent Myoview Lexiscan which showed evidence of prior infarct but no ischemia.  He was hospitalized in July 2021 for sepsis secondary to pneumonia.  He underwent BiV ICD upgrade by Dr. Caryl Comes in October 2021.  Echo in February 2022 showed an EF of 30 to 35% with mild to moderate MR.  He was hospitalized in August 2023 with shortness of breath and hypoxia, brought via EMS.  BNP 888 and high-sensitivity troponin was 68.  He was treated with IV heparin and IV Lasix.  Echo showed LVEF 25 to 30%, elevated LVEDP, severely dilated left atrium, mild MR.  The patient was [facility] for suicidal and homicidal ideation.   He was readmitted early September for COPD exacerbation. He was discharged 03/21/22.   The patient came back to the hospital 03/22/22 for shortness of breath and admitted with elevated troponin/NSTEMI, acute CHF, COPD exacerbation, COVID-19 virus, acute respiratory failure, hypotension, acute renal failure, paroxysmal Afib. He was treated with IV heparin for 48 hours. BB was restarted and digoxin was added for afib. Lisinopril was held for soft blood pressures. He was treated with IV lasix and diuresed 8.8 L.    The patient was seen in the office on 10/6 and reported persistent shortness of breath.  Lisinopril was stopped for low BP.  BNP came back at 968. Lasix was increased to 60 mg daily. Coreg was also stopped for low BP.   The patient presented to the ER 04/22/22 for worsening dyspnea and lower leg edema. He denies chest pain. No nausea or vomiting. EMS was called who administered solu-medrol and Duonebs. He was brought to the ER for further work-up.   In the ER heart rate 103bpm, RR 31 and pulse Ox 84% on RA plaved on 3L O2. Labs showed K2.7, BG 153, albumin 33. HS troponin 168>151. BNP 878.6. Ddimer 1.3. CT head showed chronic left MCA infarct with encephalomalacia. CXR showed cardiomegaly with mild perihilar edema and left absilar opacity. The patient was given potassium and admitted.   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 Authorizing Provider  apixaban (ELIQUIS) 5 MG TABS tablet TAKE 1 TABLET BY MOUTH TWICE A DAY 01/18/21  Yes Deboraha Sprang, MD  atorvastatin (LIPITOR) 40 MG tablet Take 40 mg by mouth daily.   Yes [provider]  digoxin (LANOXIN)  0.125 MG tablet Take 1 tablet (0.125 mg total) by mouth daily. 04/01/22  Yes Annita Brod, MD  divalproex (DEPAKOTE ER) 500 MG 24 hr tablet Take 1 tablet (500 mg total) by mouth 2 (two) times daily. 05/24/19  Yes Clapacs, Madie Reno, MD  escitalopram (LEXAPRO) 10 MG tablet Take 1 tablet (10 mg total) by mouth at bedtime. 05/24/19  Yes Clapacs, Madie Reno, MD  ezetimibe (ZETIA) 10 MG tablet Take 1 tablet (10 mg total) by mouth daily. 04/01/22  Yes Annita Brod, MD  furosemide (LASIX) 20 MG tablet Take 3 tablets (60 mg) by mouth once daily 04/12/22  Yes Uthman Mroczkowski H, PA-C  Hydrocortisone Acetate 1 % CREA SMARTSIG:sparingly Topical 3 Times Daily 12/24/21  Yes [provider]  hydrOXYzine (ATARAX) 10 MG tablet Take 10 mg by mouth 2 (two) times daily. 12/24/21  Yes [provider]  levETIRAcetam (KEPPRA) 250 MG tablet Take 1 tablet (250 mg total) by mouth 2 (two) times daily. 03/24/20  Yes Harvest Dark, MD  levothyroxine (SYNTHROID) 25 MCG tablet Take 1 tablet (25 mcg total) by mouth daily. 05/24/19  Yes Clapacs, Madie Reno, MD  omeprazole (PRILOSEC) 20 MG capsule Take 20 mg by mouth daily.   Yes [provider]  predniSONE (DELTASONE) 10 MG tablet Take 1 tablet (10 mg total) by mouth daily with breakfast. 04/02/22  Yes Annita Brod, MD  QUEtiapine (SEROQUEL) 200 MG tablet Take 200 mg by mouth at bedtime.   Yes [provider]  tiotropium (SPIRIVA) 18 MCG inhalation capsule Place 18 mcg into inhaler and inhale daily.   Yes [provider]  acetaminophen (TYLENOL) 500 MG tablet Take 500 mg by mouth every 6 (six) hours as needed for mild pain.    [provider]  albuterol (PROVENTIL) (2.5 MG/3ML) 0.083% nebulizer solution Take 3 mLs (2.5 mg total) by nebulization every 6 (six) hours as needed for wheezing or shortness of breath. 03/21/22   Jennye Boroughs, MD  albuterol (VENTOLIN HFA) 108 (90 Base) MCG/ACT inhaler Inhale 2 puffs into the lungs  every 6 (six) hours as needed for wheezing. 05/24/19 04/28/22  Clapacs, Madie Reno, MD  traMADol (ULTRAM) 50 MG tablet Take 1 tablet (50 mg total) by mouth 2 (two) times daily as needed. 03/31/22   Annita Brod, MD    Inpatient Medications: Scheduled Meds:  apixaban  5 mg Oral BID   aspirin EC  81 mg Oral Daily   atorvastatin  40 mg Oral Daily   digoxin  0.125 mg Oral Daily   divalproex  500 mg Oral BID   escitalopram  10 mg Oral QHS   ezetimibe  10 mg Oral Daily   feeding supplement  237 mL Oral BID BM   furosemide  40 mg Intravenous BID   hydrOXYzine  10 mg Oral BID   levETIRAcetam  250 mg Oral BID  levothyroxine  25 mcg Oral Q0600   multivitamin with minerals  1 tablet Oral Daily   pantoprazole  40 mg Oral Daily   potassium chloride  40 mEq Oral Q4H   QUEtiapine  200 mg Oral QHS   sacubitril-valsartan  1 tablet Oral BID   [START ON 04/24/2022] tiotropium  18 mcg Inhalation Daily   Continuous Infusions:  PRN Meds: acetaminophen **OR** acetaminophen, albuterol, magnesium hydroxide, ondansetron **OR** ondansetron (ZOFRAN) IV, traMADol, traZODone  Allergies:    Allergies  Allergen Reactions   Iodinated Contrast Media Shortness Of Breath   Penicillins Anaphylaxis and Shortness Of Breath    Respiratory  Tolerated cefuroxime on 01/06/16   Strawberry Extract Anaphylaxis   Cefepime Itching    Empiric antibiotic, developed pruritis.    Erythromycin Itching   Sulfa Antibiotics Itching, Nausea And Vomiting and Nausea Only    Social History:   Social History   Socioeconomic History   Marital status: Widowed    Spouse name: Not on file   Number of children: Not on file   Years of education: Not on file   Highest education level: Not on file  Occupational History   Not on file  Tobacco Use   Smoking status: Every Day    Packs/day: 0.25    Years: 0.00    Total pack years: 0.00    Types: Cigarettes   Smokeless tobacco: Never  Vaping Use   Vaping Use: Never used   Substance and Sexual Activity   Alcohol use: Not Currently   Drug use: Never   Sexual activity: Not Currently    Birth control/protection: Abstinence  Other Topics Concern   Not on file  Social History Narrative   Not on file   Social Determinants of Health   Financial Resource Strain: Not on file  Food Insecurity: No Food Insecurity (04/22/2022)   Hunger Vital Sign    Worried About Running Out of Food in the Last Year: Never true    Ran Out of Food in the Last Year: Never true  Transportation Needs: No Transportation Needs (04/22/2022)   PRAPARE - Hydrologist (Medical): No    Lack of Transportation (Non-Medical): No  Physical Activity: Not on file  Stress: Not on file  Social Connections: Not on file  Intimate Partner Violence: Not At Risk (04/22/2022)   Humiliation, Afraid, Rape, and Kick questionnaire    Fear of Current or Ex-Partner: No    Emotionally Abused: No    Physically Abused: No    Sexually Abused: No    Family History:    Family History  Problem Relation Age of Onset   Cancer Mother        "femal cancer"   Prostate cancer Father      ROS:  Please see the history of present illness.   All other ROS reviewed and negative.     Physical Exam/Data:   Vitals:   04/23/22 0630 04/23/22 0800 04/23/22 0835 04/23/22 0846  BP: (!) 85/62 93/61    Pulse: 77 68    Resp: 13 16    Temp:    (!) 97.5 F (36.4 C)  TempSrc:    Oral  SpO2: 96% 99%    Weight:   86.5 kg   Height:   5\' 9"  (1.753 m)     Intake/Output Summary (Last 24 hours) at 04/23/2022 1056 Last data filed at 04/23/2022 0832 Gross per 24 hour  Intake 1029.53 ml  Output 950 ml  Net 79.53 ml      04/23/2022    8:35 AM 04/12/2022   11:03 AM 04/05/2022   11:40 AM  Last 3 Weights  Weight (lbs) 190 lb 11.2 oz 196 lb 187 lb  Weight (kg) 86.5 kg 88.905 kg 84.823 kg     Body mass index is 28.16 kg/m.  General:  Well nourished, well developed, in no acute  distress HEENT: normal Neck: no JVD Vascular: No carotid bruits; Distal pulses 2+ bilaterally Cardiac:  normal S1, S2; RRR; no murmur  Lungs:  clear to auscultation bilaterally, no wheezing, rhonchi or rales  Abd: soft, nontender, no hepatomegaly  Ext: L>R mild lower leg edema Musculoskeletal:  No deformities, BUE and BLE strength normal and equal Skin: warm and dry  Neuro:  CNs 2-12 intact, no focal abnormalities noted Psych:  Normal affect   EKG:  The EKG was personally reviewed and demonstrates:  A sensed V paced, 88bpm, LAD, PVCs, IVCD   Relevant CV Studies:    Echo 02/2022  1. Left ventricular ejection fraction, by estimation, is 25 to 30%. The  left ventricle has severely decreased function. The left ventricle  demonstrates regional wall motion abnormalities (see scoring  diagram/findings for description). The left  ventricular internal cavity size was moderately dilated. There is mild  concentric left ventricular hypertrophy. Left ventricular diastolic  parameters are indeterminate. Elevated left ventricular end-diastolic  pressure.   2. Right ventricular systolic function is normal. The right ventricular  size is normal.   3. Left atrial size was severely dilated.   4. Right atrial size was mildly dilated.   5. The mitral valve is normal in structure. Mild mitral valve  regurgitation. No evidence of mitral stenosis.   6. The aortic valve is tricuspid. Aortic valve regurgitation is mild. No  aortic stenosis is present.   7. The inferior vena cava is normal in size with <50% respiratory  variability, suggesting right atrial pressure of 8 mmHg.    Echo 2021  1. Left ventricular ejection fraction, by estimation, is 25 to 30%. The  left ventricle has severely decreased function. The left ventricle  demonstrates global hypokinesis. Left ventricular diastolic parameters are  consistent with Grade I diastolic  dysfunction (impaired relaxation).   2. Right ventricular  systolic function is mildly reduced. The right  ventricular size is moderately enlarged. There is mildly elevated  pulmonary artery systolic pressure.   3. Left atrial size was mild to moderately dilated.   4. Right atrial size was moderately dilated.   5. The mitral valve is degenerative. Mild to moderate mitral valve  regurgitation. No evidence of mitral stenosis.   6. The aortic valve is normal in structure. Aortic valve regurgitation is  trivial. No aortic stenosis is present.   7. The inferior vena cava is normal in size with greater than 50%  respiratory variability, suggesting right atrial pressure of 3 mmHg.   Conclusion(s)/Recommendation(s): Findings consistent with ischemic  cardiomyopathy.   Laboratory Data:  High Sensitivity Troponin:   Recent Labs  Lab 04/21/22 2357 04/22/22 0218  TROPONINIHS 168* 151*     Chemistry Recent Labs  Lab 04/21/22 2357 04/22/22 0557 04/23/22 0454  NA 136 137 139  K 2.7* 3.7 3.1*  CL 99 101 102  CO2 27 27 30   GLUCOSE 153* 176* 144*  BUN 11 12 21   CREATININE 1.20 1.01 1.03  CALCIUM 9.2 8.8* 8.9  MG  --  1.7 1.7  GFRNONAA >60 >60 >60  ANIONGAP 10 9  7    Recent Labs  Lab 04/21/22 2357  PROT 7.3  ALBUMIN 3.0*  AST 27  ALT 13  ALKPHOS 53  BILITOT 0.7   Lipids No results for input(s): "CHOL", "TRIG", "HDL", "LABVLDL", "LDLCALC", "CHOLHDL" in the last 168 hours.  Hematology Recent Labs  Lab 04/21/22 2357 04/22/22 0557 04/23/22 0454  WBC 7.2 5.7 9.7  RBC 4.17* 4.22 4.21*  HGB 12.0* 12.1* 12.2*  HCT 37.3* 38.2* 38.3*  MCV 89.4 90.5 91.0  MCH 28.8 28.7 29.0  MCHC 32.2 31.7 31.9  RDW 17.7* 17.8* 17.7*  PLT 339 277 326   Thyroid  Recent Labs  Lab 04/22/22 0557  TSH 4.961*    BNP Recent Labs  Lab 04/21/22 2356  BNP 878.6*    DDimer  Recent Labs  Lab 04/22/22 0218  DDIMER 1.30*     Radiology/Studies:  CT Head Wo Contrast  Result Date: 04/22/2022 CLINICAL DATA:  Altered mental status EXAM: CT HEAD  WITHOUT CONTRAST TECHNIQUE: Contiguous axial images were obtained from the base of the skull through the vertex without intravenous contrast. RADIATION DOSE REDUCTION: This exam was performed according to the departmental dose-optimization program which includes automated exposure control, adjustment of the mA and/or kV according to patient size and/or use of iterative reconstruction technique. COMPARISON:  03/14/2022 FINDINGS: Brain: Mild atrophic changes and chronic white matter ischemic changes are seen. There is diffuse encephalomalacia in the left parietooccipital and temporal lobes consistent with prior infarct. These are stable from the prior study. No acute hemorrhage, acute infarction or space-occupying mass lesion is seen. Vascular: No hyperdense vessel or unexpected calcification. Skull: Normal. Negative for fracture or focal lesion. Sinuses/Orbits: No acute finding. Other: None. IMPRESSION: Chronic left MCA infarct with encephalomalacia. No acute abnormality noted. Electronically Signed   By: Inez Catalina M.D.   On: 04/22/2022 02:05   DG Chest Port 1 View  Result Date: 04/22/2022 CLINICAL DATA:  Shortness of breath EXAM: PORTABLE CHEST 1 VIEW COMPARISON:  04/12/2022 FINDINGS: Cardiomegaly with mild perihilar edema. Mild left basilar opacity, likely atelectasis. No definite pleural effusions. No pneumothorax. Valve prosthesis. Left subclavian ICD. Postsurgical changes related to prior CABG. Median sternotomy. IMPRESSION: Cardiomegaly with mild perihilar edema. Left basilar opacity, likely atelectasis. Electronically Signed   By: Julian Hy M.D.   On: 04/22/2022 00:11     Assessment and Plan:   Shortness of breath/LLE Acute on chronic systolic and diastolic heart failure Ischemic cardiomyopathy LVEF 25 to 30% -Multiple admissions in the last few months. -Presented with worsening shortness of breath, Lasix had recently been increased to 60 mg daily -Potassium was 2.7 on  admission -Lisinopril and Coreg previously held for low BP -BNP 878 -IV Lasix 40 mg twice daily - He is on Entresto here, however may need to hold for low BP -Continue digoxin, check dig level -Continue with diuresis  Elevated troponin CAD status post CABG -High-sensitivity troponin 168> 151 - no chest pain reported -Suspect supply demand mismatch in the setting of acute heart failure -No IV heparin -Continue aspirin, Zetia, Lipitor  Paroxysmal A-fib - He is in NSR  - Continue Eliquis 5 mg twice daily for stroke prophylaxis -Continue Coreg for rate control  ICD shock S/p ICD -Interrogation done in the ER, will review with MD -EP can see tomorrow  Hypokalemia -2.7 on admission status post supplementation -Daily BMS  For questions or updates, please contact [place] Please consult [email or web address] for contact info under    Signed, Payal Stanforth Ninfa Meeker, PA-C  04/23/2022 10:56 AM

## 2022-04-23 NOTE — Consult Note (Signed)
ANTICOAGULATION CONSULT NOTE - Initial Consult  Pharmacy Consult for Heparin infusion Indication: atrial fibrillation  Allergies  Allergen Reactions   Iodinated Contrast Media Shortness Of Breath   Penicillins Anaphylaxis and Shortness Of Breath    Respiratory  Tolerated cefuroxime on 01/06/16   Strawberry Extract Anaphylaxis   Cefepime Itching    Empiric antibiotic, developed pruritis.    Erythromycin Itching   Sulfa Antibiotics Itching, Nausea And Vomiting and Nausea Only    Patient Measurements: Height: 5\' 9"  (175.3 cm) Weight: 86.5 kg (190 lb 11.2 oz) IBW/kg (Calculated) : 70.7 Heparin Dosing Weight: 86.5 kg  Vital Signs: Temp: 97.7 F (36.5 C) (10/17 1625) Temp Source: Oral (10/17 1625) BP: 95/61 (10/17 1625) Pulse Rate: 78 (10/17 1625)  Labs: Recent Labs    04/21/22 2357 04/22/22 0218 04/22/22 0557 04/23/22 0454  HGB 12.0*  --  12.1* 12.2*  HCT 37.3*  --  38.2* 38.3*  PLT 339  --  277 326  CREATININE 1.20  --  1.01 1.03  TROPONINIHS 168* 151*  --   --     Estimated Creatinine Clearance: 79.9 mL/min (by C-G formula based on SCr of 1.03 mg/dL).   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 Pharmacy  consulted to transition to IV heparin therapy for possible procedures  Goal of Therapy:  Heparin level 0.3-0.7 units/ml aPTT 66-102 seconds Monitor platelets by anticoagulation protocol: Yes   Plan:   Start heparin infusion at 1300 units/hr tonight at 2100 Check aPTT level in 6 hours Transition to Heparin level monitoring when therapeutic and correlating with aPTT Continue to monitor H&H and platelets  Dorothe Pea, PharmD, BCPS Clinical Pharmacist   04/23/2022,6:55 PM

## 2022-04-23 NOTE — ED Notes (Signed)
Pt placed on hospital bed

## 2022-04-23 NOTE — ED Notes (Signed)
Admit MD. Carl [name] messaged about pt's bp. Will wait for any new orders

## 2022-04-23 NOTE — Assessment & Plan Note (Signed)
--  A note from 10/16 documented Device alert for VF with successful therapy.  Tele showed pt having frequent short stretches of VTach. Plan: --device interrogation today --cardiology consult today

## 2022-04-23 NOTE — ED Notes (Signed)
Patient alert, oriented to name place and year. Resp even, unlabored on 2 L per [place]. Requesting additional trimming to mustache and beard. Paced on monitor with frequent PVC's Watching television with no distress noted.

## 2022-04-24 DIAGNOSIS — I4891 Unspecified atrial fibrillation: Secondary | ICD-10-CM | POA: Diagnosis not present

## 2022-04-24 DIAGNOSIS — J9601 Acute respiratory failure with hypoxia: Secondary | ICD-10-CM | POA: Diagnosis not present

## 2022-04-24 DIAGNOSIS — I4901 Ventricular fibrillation: Secondary | ICD-10-CM | POA: Diagnosis not present

## 2022-04-24 LAB — CBC
HCT: 39.3 % (ref 39.0–52.0)
Hemoglobin: 12.6 g/dL — ABNORMAL LOW (ref 13.0–17.0)
MCH: 29 pg (ref 26.0–34.0)
MCHC: 32.1 g/dL (ref 30.0–36.0)
MCV: 90.6 fL (ref 80.0–100.0)
Platelets: 329 10*3/uL (ref 150–400)
RBC: 4.34 MIL/uL (ref 4.22–5.81)
RDW: 17.8 % — ABNORMAL HIGH (ref 11.5–15.5)
WBC: 8.1 10*3/uL (ref 4.0–10.5)
nRBC: 0 % (ref 0.0–0.2)

## 2022-04-24 LAB — BLOOD GAS, ARTERIAL
Acid-Base Excess: 8.8 mmol/L — ABNORMAL HIGH (ref 0.0–2.0)
Bicarbonate: 32.7 mmol/L — ABNORMAL HIGH (ref 20.0–28.0)
O2 Content: 3 L/min
O2 Saturation: 97.6 %
Patient temperature: 37
pCO2 arterial: 41 mmHg (ref 32–48)
pH, Arterial: 7.51 — ABNORMAL HIGH (ref 7.35–7.45)
pO2, Arterial: 83 mmHg (ref 83–108)

## 2022-04-24 LAB — HEPARIN LEVEL (UNFRACTIONATED)
Heparin Unfractionated: 0.97 IU/mL — ABNORMAL HIGH (ref 0.30–0.70)
Heparin Unfractionated: 0.64 IU/mL (ref 0.30–0.70)

## 2022-04-24 LAB — BASIC METABOLIC PANEL
Anion gap: 9 (ref 5–15)
BUN: 28 mg/dL — ABNORMAL HIGH (ref 8–23)
CO2: 32 mmol/L (ref 22–32)
Calcium: 9.4 mg/dL (ref 8.9–10.3)
Chloride: 100 mmol/L (ref 98–111)
Creatinine, Ser: 1.25 mg/dL — ABNORMAL HIGH (ref 0.61–1.24)
GFR, Estimated: >60 mL/min (ref >60)
Glucose, Bld: 110 mg/dL — ABNORMAL HIGH (ref 70–99)
Potassium: 3.8 mmol/L (ref 3.5–5.1)
Sodium: 141 mmol/L (ref 135–145)

## 2022-04-24 LAB — MAGNESIUM: Magnesium: 1.9 mg/dL (ref 1.7–2.4)

## 2022-04-24 LAB — APTT
aPTT: 78 seconds — ABNORMAL HIGH (ref 24–36)
aPTT: 122 seconds — ABNORMAL HIGH (ref 24–36)
aPTT: 99 seconds — ABNORMAL HIGH (ref 24–36)

## 2022-04-24 MED ORDER — DIPHENHYDRAMINE HCL 50 MG/ML IJ SOLN: 50.0000 mg | Freq: Once | INTRAMUSCULAR | Status: AC

## 2022-04-24 MED ORDER — SODIUM CHLORIDE 0.9% FLUSH
3.0000 mL | Freq: Two times a day (BID) | INTRAVENOUS | Status: DC
  Administered 2022-04-25 – 2022-04-30 (×6): 3 mL via INTRAVENOUS

## 2022-04-24 MED ORDER — AMIODARONE LOAD VIA INFUSION
150.0000 mg | Freq: Once | INTRAVENOUS | Status: AC
  Administered 2022-04-24: 150 mg via INTRAVENOUS
  Filled 2022-04-24: qty 83.34

## 2022-04-24 MED ORDER — PREDNISONE 50 MG PO TABS
50.0000 mg | ORAL_TABLET | Freq: Four times a day (QID) | ORAL | Status: AC
  Administered 2022-04-24 – 2022-04-25 (×3): 50 mg via ORAL
  Filled 2022-04-24 (×4): qty 1

## 2022-04-24 MED ORDER — SODIUM CHLORIDE 0.9 % IV SOLN: 250.0000 mL | INTRAVENOUS | Status: DC | PRN

## 2022-04-24 MED ORDER — ASPIRIN 81 MG PO CHEW: 81.0000 mg | CHEWABLE_TABLET | ORAL | Status: AC

## 2022-04-24 MED ORDER — LEVOTHYROXINE SODIUM 25 MCG PO TABS
37.5000 ug | ORAL_TABLET | Freq: Every day | ORAL | Status: DC
  Administered 2022-04-25 – 2022-04-30 (×6): 37.5 ug via ORAL
  Filled 2022-04-24 (×5): qty 2

## 2022-04-24 MED ORDER — AMIODARONE HCL IN DEXTROSE 360-4.14 MG/200ML-% IV SOLN
30.0000 mg/h | INTRAVENOUS | Status: DC
  Administered 2022-04-24 – 2022-04-26 (×4): 30 mg/h via INTRAVENOUS
  Filled 2022-04-24 (×4): qty 200

## 2022-04-24 MED ORDER — DIPHENHYDRAMINE HCL 25 MG PO CAPS
50.0000 mg | ORAL_CAPSULE | Freq: Once | ORAL | Status: AC
  Administered 2022-04-25: 50 mg via ORAL
  Filled 2022-04-24: qty 2

## 2022-04-24 MED ORDER — SODIUM CHLORIDE 0.9 % IV SOLN: INTRAVENOUS | Status: DC

## 2022-04-24 MED ORDER — SODIUM CHLORIDE 0.9% FLUSH: 3.0000 mL | INTRAVENOUS | Status: DC | PRN

## 2022-04-24 MED ORDER — AMIODARONE HCL IN DEXTROSE 360-4.14 MG/200ML-% IV SOLN
60.0000 mg/h | INTRAVENOUS | Status: DC
  Administered 2022-04-24 (×2): 60 mg/h via INTRAVENOUS
  Filled 2022-04-24: qty 200

## 2022-04-24 NOTE — Plan of Care (Signed)
  Problem: Clinical Measurements: Goal: Cardiovascular complication will be avoided Outcome: Progressing   Problem: Clinical Measurements: Goal: Cardiovascular complication will be avoided Outcome: Progressing   

## 2022-04-24 NOTE — TOC Initial Note (Signed)
Transition of Care [facility]) - Initial/Assessment Note    Patient Details  Name: Carl [name] MRN: [id number] Date of Birth: [birth year]  Transition of Care [facility] CM/SW Contact:    Margarito Liner, LCSW Phone Number: 04/24/2022, 1:16 PM  Clinical Narrative:  CSW familiar with patient from previous admissions. Patient is from [name]. Left voicemail for owner to see if there are any concerns with him returning. Spoke to DSS legal guardian, [name]. No concerns with patient returning but she asked if there were any other treatments/monitoring that could be done to avoid frequent readmissions. Will relay this to MD. [facility] is no longer active with patient for home health. No further concerns. CSW encouraged Shanda Bumps to contact CSW as needed. CSW will continue to follow patient and his guardian for support and facilitate return to ALF when stable.                Expected Discharge Plan: Assisted Living Barriers to Discharge: Continued Medical Work up   Patient Goals and CMS Choice        Expected Discharge Plan and Services Expected Discharge Plan: Assisted Living     Post Acute Care Choice: NA Living arrangements for the past 2 months: Assisted Living Facility                                      Prior Living Arrangements/Services Living arrangements for the past 2 months: Assisted Living Facility Lives with:: Facility Resident Patient language and need for interpreter reviewed:: Yes Do you feel safe going back to the place where you live?: Yes      Need for Family Participation in Patient Care: Yes (Comment) Care giver support system in place?: Yes (comment)   Criminal Activity/Legal Involvement Pertinent to Current Situation/Hospitalization: No - Comment as needed  Activities of Daily Living Home Assistive Devices/Equipment: Eyeglasses, Dan Humphreys (specify type) ADL Screening (condition at time of admission) Patient's cognitive ability adequate to safely  complete daily activities?: No Is the patient deaf or have difficulty hearing?: No Does the patient have difficulty seeing, even when wearing glasses/contacts?: No Does the patient have difficulty concentrating, remembering, or making decisions?: Yes Patient able to express need for assistance with ADLs?: Yes Does the patient have difficulty dressing or bathing?: Yes Independently performs ADLs?: No Communication: Independent Dressing (OT): Needs assistance Is this a change from baseline?: Pre-admission baseline Grooming: Needs assistance Is this a change from baseline?: Pre-admission baseline Feeding: Independent Bathing: Needs assistance Is this a change from baseline?: Pre-admission baseline Toileting: Needs assistance Is this a change from baseline?: Pre-admission baseline In/Out Bed: Needs assistance Is this a change from baseline?: Pre-admission baseline Walks in Home: Independent with device (comment) Does the patient have difficulty walking or climbing stairs?: Yes Weakness of Legs: Both Weakness of Arms/Hands: None  Permission Sought/Granted Permission sought to share information with : Facility Medical sales representative, Guardian    Share Information with NAME: [name]  Permission granted to share info w AGENCY: [facility]  Permission granted to share info w Relationship: Legal guardian  Permission granted to share info w Contact Information: [phone]  Emotional Assessment   Attitude/Demeanor/Rapport: Unable to Assess Affect (typically observed): Unable to Assess Orientation: : Oriented to Self, Oriented to Place Alcohol / Substance Use: Not Applicable Psych Involvement: No (comment)  Admission diagnosis:  Shortness of breath [R06.02] Hypokalemia [E87.6] Elevated troponin [R79.89] Acute respiratory failure with  hypoxia ([place]) [J96.01] Patient Active Problem List   Diagnosis Date Noted   NSVT (nonsustained ventricular tachycardia) ([place]) 04/23/2022    Acute respiratory failure with hypoxia ([place]) 04/22/2022   Hypokalemia 04/22/2022   Demand ischemia    Type 2 diabetes mellitus with hyperglycemia ([place]) 03/25/2022   Overweight (BMI 25.0-29.9) 03/23/2022   COVID-19 virus infection 03/23/2022   Respiratory failure ([place]) 03/22/2022   Dilated cardiomyopathy ([place])    Chest pain    COPD exacerbation ([place]) 03/14/2022   Seizure disorder ([place]) 03/14/2022   Dyspnea    Congestive heart failure ([place]) 02/15/2022   Anxiety 02/15/2022   Tobacco use 02/15/2022   Elevated partial thromboplastin time (PTT) 02/15/2022   Abdominal pain 02/15/2022   HFrEF (heart failure with reduced ejection fraction) ([place])    Weakness    COPD with acute exacerbation ([place]) 05/23/2021   Paroxysmal atrial fibrillation ([place]) 05/23/2021   Chronic anticoagulation 05/23/2021   Abdominal aortic aneurysm (AAA) 35 to 39 mm in diameter ([place]) 05/23/2021   Ischemic cardiomyopathy 74/25/9563   Chronic systolic heart failure ([place]) 05/22/2020   HCAP (healthcare-associated pneumonia) 01/24/2020   Hyperkalemia 01/24/2020   Acute on chronic combined systolic (congestive) and diastolic (congestive) heart failure ([place]) 01/24/2020   History of seizure 01/24/2020   HLD (hyperlipidemia) 01/24/2020   Ascites due to alcoholic cirrhosis ([place])    Acute on chronic HFrEF (heart failure with reduced ejection fraction) ([place]) 11/18/2019   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/18/2019   Elevated troponin 11/18/2019   Hypoxia 11/18/2019   Coronary artery disease 05/15/2019   History of stroke 05/15/2019   Homicidal ideation 05/15/2019   Hypothyroidism 05/15/2019   Constipation 05/06/2019   Scrotal mass 05/06/2019   Abnormal urine odor 05/06/2019   Bipolar 1 disorder ([place]) 12/23/2018   Chronic pain of left knee 09/17/2018   Atypical chest pain 10/11/2017   Polypharmacy 08/05/2017   Cerebrovascular accident (CVA) due to stenosis of left middle cerebral artery ([place]) 06/26/2017    Urinary retention due to benign prostatic hyperplasia 06/22/2017   Falls 06/20/2017   Risk for falls 02/19/2017   Dyslipidemia 11/19/2016   Peripheral artery disease ([place]) 08/28/2016   Claudication ([place]) 08/09/2016   History of alcohol abuse 08/09/2016   History of drug dependence/abuse ([place]) 08/09/2016   Severe single current episode of major depressive disorder, without psychotic features ([place]) 08/09/2016   Mitral insufficiency 01/04/2016   Prediabetes 12/29/2015   Essential hypertension 12/29/2015   Fracture of left clavicle 07/13/2012   PCP:  [facility]:   [place], [place] - [address] [place] [address] Phone: [phone] Fax: [phone]     Social Determinants of Health (SDOH) Interventions    Readmission Risk Interventions    03/15/2022    2:44 PM  Readmission Risk Prevention Plan  HRI or [place] Complete  Social Work Consult for [place] Planning/Counseling Complete  Palliative Care Screening Not Applicable  Medication Review Press photographer) Complete

## 2022-04-24 NOTE — Progress Notes (Addendum)
Rounding Note    Patient Name: Carl [name] Date of Encounter: 04/24/2022  [place] Cardiologist: Kathlyn Sacramento, MD   Subjective   No acute events overnight, no additional shocks this admission, legs are slightly tender, denies chest pain.  Appears to be volume overloaded per OptiVol ICD reports.    Inpatient Medications    Scheduled Meds:  amiodarone  150 mg Intravenous Once   aspirin EC  81 mg Oral Daily   atorvastatin  40 mg Oral Daily   divalproex  500 mg Oral BID   escitalopram  10 mg Oral QHS   ezetimibe  10 mg Oral Daily   feeding supplement  237 mL Oral BID BM   hydrOXYzine  10 mg Oral BID   levETIRAcetam  250 mg Oral BID   [START ON 04/25/2022] levothyroxine  37.5 mcg Oral Q0600   losartan  12.5 mg Oral Daily   metoprolol succinate  12.5 mg Oral QHS   multivitamin with minerals  1 tablet Oral Daily   pantoprazole  40 mg Oral Daily   tiotropium  18 mcg Inhalation Daily   Continuous Infusions:  amiodarone     Followed by   amiodarone     heparin 1,150 Units/hr (04/24/22 1211)   PRN Meds: acetaminophen **OR** acetaminophen, albuterol, magnesium hydroxide, ondansetron **OR** ondansetron (ZOFRAN) IV, traMADol, traZODone   Vital Signs    Vitals:   04/24/22 0500 04/24/22 0519 04/24/22 0813 04/24/22 1238  BP:  91/66 103/64 106/66  Pulse:  70 76 79  Resp:  18 18 18   Temp:  97.7 F (36.5 C) 97.9 F (36.6 C) 97.7 F (36.5 C)  TempSrc:      SpO2:  98% 94% 99%  Weight: 91.4 kg     Height:        Intake/Output Summary (Last 24 hours) at 04/24/2022 1450 Last data filed at 04/24/2022 1000 Gross per 24 hour  Intake 868.16 ml  Output 550 ml  Net 318.16 ml      04/24/2022    5:00 AM 04/23/2022    8:35 AM 04/12/2022   11:03 AM  Last 3 Weights  Weight (lbs) 201 lb 8 oz 190 lb 11.2 oz 196 lb  Weight (kg) 91.4 kg 86.5 kg 88.905 kg      Telemetry    A sensed V paced rhythm- Personally Reviewed  ECG     - Personally  Reviewed  Physical Exam   GEN: No acute distress.   Neck: No JVD Cardiac: RRR,  Respiratory: Decreased breath sounds, no wheezing GI: Soft, nontender, non-distended  MS: trace edema; No deformity. Neuro:  Nonfocal  Psych: Normal affect   Labs    High Sensitivity Troponin:   Recent Labs  Lab 04/21/22 2357 04/22/22 0218  TROPONINIHS 168* 151*     Chemistry Recent Labs  Lab 04/21/22 2357 04/22/22 0557 04/23/22 0454 04/24/22 0413  NA 136 137 139 141  K 2.7* 3.7 3.1* 3.8  CL 99 101 102 100  CO2 27 27 30  32  GLUCOSE 153* 176* 144* 110*  BUN 11 12 21  28*  CREATININE 1.20 1.01 1.03 1.25*  CALCIUM 9.2 8.8* 8.9 9.4  MG  --  1.7 1.7 1.9  PROT 7.3  --   --   --   ALBUMIN 3.0*  --   --   --   AST 27  --   --   --   ALT 13  --   --   --  ALKPHOS 53  --   --   --   BILITOT 0.7  --   --   --   GFRNONAA >60 >60 >60 >60  ANIONGAP 10 9 7 9     Lipids No results for input(s): "CHOL", "TRIG", "HDL", "LABVLDL", "LDLCALC", "CHOLHDL" in the last 168 hours.  Hematology Recent Labs  Lab 04/22/22 0557 04/23/22 0454 04/24/22 0413  WBC 5.7 9.7 8.1  RBC 4.22 4.21* 4.34  HGB 12.1* 12.2* 12.6*  HCT 38.2* 38.3* 39.3  MCV 90.5 91.0 90.6  MCH 28.7 29.0 29.0  MCHC 31.7 31.9 32.1  RDW 17.8* 17.7* 17.8*  PLT 277 326 329   Thyroid  Recent Labs  Lab 04/22/22 0557  TSH 4.961*    BNP Recent Labs  Lab 04/21/22 2356  BNP 878.6*    DDimer  Recent Labs  Lab 04/22/22 0218  DDIMER 1.30*     Radiology    No results found.  Cardiac Studies   TTE 02/16/2022 1. Left ventricular ejection fraction, by estimation, is 25 to 30%. The  left ventricle has severely decreased function. The left ventricle  demonstrates regional wall motion abnormalities (see scoring  diagram/findings for description). The left  ventricular internal cavity size was moderately dilated. There is mild  concentric left ventricular hypertrophy. Left ventricular diastolic  parameters are indeterminate.  Elevated left ventricular end-diastolic  pressure.   2. Right ventricular systolic function is normal. The right ventricular  size is normal.   3. Left atrial size was severely dilated.   4. Right atrial size was mildly dilated.   5. The mitral valve is normal in structure. Mild mitral valve  regurgitation. No evidence of mitral stenosis.   6. The aortic valve is tricuspid. Aortic valve regurgitation is mild. No  aortic stenosis is present.   7. The inferior vena cava is normal in size with <50% respiratory  variability, suggesting right atrial pressure of 8 mmHg.  Patient Profile     61 y.o. male with history of CAD/CABG, ischemic cardiomyopathy s/p AICD, COPD, bipolar disorder presenting with shortness of breath, leg edema and ICD shock secondary to VT/VF.  Being seen for HFrEF and VT.  Assessment & Plan    Ischemic cardiomyopathy EF 25 to 30%. -Volume overloaded per physical exam and OptiVol -Continue IV Lasix 40 mg twice daily -Monitor creatinine -Continue Toprol-XL 12.5 mg daily, losartan 12.5.  2.  VT/VF s/p AICD shock -Appreciate input from EP -Continue IV amiodarone x48 hours followed by 400 mg daily. -Plan ischemic work-up/left heart cath tomorrow or Friday -Keep K above 4, mag above 2.  3.  CAD/CABG -Aspirin, Lipitor, Zetia  4.  Paroxysmal atrial fibrillation -Heparin for now -Toprol-XL -Restart Eliquis prior to discharge after left heart cath.   Total encounter time more than 50 minutes  Greater than 50% was spent in counseling and coordination of care with the patient     Signed, Sunday, MD  04/24/2022, 2:50 PM

## 2022-04-24 NOTE — Consult Note (Signed)
Cardiology Consultation   Patient ID: Carl [name] MRN: [id number]; DOB: 1960/10/18  Admit date: 04/22/2022 Date of Consult: 04/24/2022  PCP:  [name]   [facility] Providers Cardiologist:  Carl Bears, MD  Electrophysiologist:  Carl Manges, MD  {   Patient Profile:   Carl [name] is a 61 y.o. male with a hx of coronary artery disease post CABG, chronic systolic heart failure, ICD in situ, paroxysmal atrial fibrillation, stroke, COPD, bipolar disorder, tobacco abuse who is being seen 04/24/2022 for the evaluation of ICD shocks at the request of Dr. Okey [name].  History of Present Illness:   Carl [name] was admitted to the hospital on October 17 with shortness of breath and ICD shock.  He has had several hospitalizations over the last few months for shortness of breath and mental health concerns.  His Coreg was stopped recently due to borderline blood pressures.  His Lasix was increased on October 6 but despite this he has had worsening shortness of breath leg swelling and weight gain.  Prior to his arrival to the hospital yesterday, the office was notified of a ventricular fibrillation event requiring ICD shock.  The patient is unable to contribute to today's history due to tangential nature of his conversation.   Past Medical History:  Diagnosis Date   Acute pulmonary edema (HCC) 2017   Acute respiratory failure with hypoxia (HCC) 2017   Bipolar 1 disorder (HCC)    CHF (congestive heart failure) (HCC)    Chronic kidney disease    COPD (chronic obstructive pulmonary disease) (HCC)    Depression    Hypertension    Myocardial infarction [facility])    NSTEMI (non-ST elevated myocardial infarction) (HCC)    RVAD (right ventricular assist device) present (HCC) 01/06/2016   Seizures (HCC)    childhood   Stroke (HCC)    Tobacco abuse     Past Surgical History:  Procedure Laterality Date   BIV UPGRADE N/A 05/05/2020   Procedure: BIV ICD UPGRADE;   Surgeon: Duke Salvia, MD;  Location: [facility] INVASIVE CV LAB;  Service: Cardiovascular;  Laterality: N/A;   CORONARY ARTERY BYPASS GRAFT         Inpatient Medications: Scheduled Meds:  aspirin EC  81 mg Oral Daily   atorvastatin  40 mg Oral Daily   digoxin  0.125 mg Oral Daily   divalproex  500 mg Oral BID   escitalopram  10 mg Oral QHS   ezetimibe  10 mg Oral Daily   feeding supplement  237 mL Oral BID BM   hydrOXYzine  10 mg Oral BID   levETIRAcetam  250 mg Oral BID   levothyroxine  25 mcg Oral Q0600   losartan  12.5 mg Oral Daily   metoprolol succinate  12.5 mg Oral QHS   multivitamin with minerals  1 tablet Oral Daily   pantoprazole  40 mg Oral Daily   QUEtiapine  200 mg Oral QHS   tiotropium  18 mcg Inhalation Daily   Continuous Infusions:  heparin 1,300 Units/hr (04/23/22 2108)   PRN Meds: acetaminophen **OR** acetaminophen, albuterol, magnesium hydroxide, ondansetron **OR** ondansetron (ZOFRAN) IV, traMADol, traZODone  Allergies:    Allergies  Allergen Reactions   Iodinated Contrast Media Shortness Of Breath   Penicillins Anaphylaxis and Shortness Of Breath    Respiratory  Tolerated cefuroxime on 01/06/16   Strawberry Extract Anaphylaxis   Cefepime Itching    Empiric antibiotic, developed pruritis.    Erythromycin Itching   Sulfa Antibiotics Itching,  Nausea And Vomiting and Nausea Only    Social History:   Social History   Socioeconomic History   Marital status: Widowed    Spouse name: Not on file   Number of children: Not on file   Years of education: Not on file   Highest education level: Not on file  Occupational History   Not on file  Tobacco Use   Smoking status: Every Day    Packs/day: 0.25    Years: 0.00    Total pack years: 0.00    Types: Cigarettes   Smokeless tobacco: Never  Vaping Use   Vaping Use: Never used  Substance and Sexual Activity   Alcohol use: Not Currently   Drug use: Never   Sexual activity: Not Currently    Birth  control/protection: Abstinence  Other Topics Concern   Not on file  Social History Narrative   Not on file   Social Determinants of Health   Financial Resource Strain: Not on file  Food Insecurity: No Food Insecurity (04/22/2022)   Hunger Vital Sign    Worried About Running Out of Food in the Last Year: Never true    Ran Out of Food in the Last Year: Never true  Transportation Needs: No Transportation Needs (04/22/2022)   PRAPARE - Hydrologist (Medical): No    Lack of Transportation (Non-Medical): No  Physical Activity: Not on file  Stress: Not on file  Social Connections: Not on file  Intimate Partner Violence: Not At Risk (04/22/2022)   Humiliation, Afraid, Rape, and Kick questionnaire    Fear of Current or Ex-Partner: No    Emotionally Abused: No    Physically Abused: No    Sexually Abused: No    Family History:    Family History  Problem Relation Age of Onset   Cancer Mother        "femal cancer"   Prostate cancer Father      ROS:  Please see the history of present illness.   All other ROS reviewed and negative.     Physical Exam/Data:   Vitals:   04/24/22 0217 04/24/22 0500 04/24/22 0519 04/24/22 0813  BP: (!) 93/58  91/66 103/64  Pulse: 70  70 76  Resp: 18  18 18   Temp: 98 F (36.7 C)  97.7 F (36.5 C) 97.9 F (36.6 C)  TempSrc:      SpO2: 97%  98% 94%  Weight:  91.4 kg    Height:        Intake/Output Summary (Last 24 hours) at 04/24/2022 1040 Last data filed at 04/24/2022 0200 Gross per 24 hour  Intake 388.16 ml  Output 800 ml  Net -411.84 ml      04/24/2022    5:00 AM 04/23/2022    8:35 AM 04/12/2022   11:03 AM  Last 3 Weights  Weight (lbs) 201 lb 8 oz 190 lb 11.2 oz 196 lb  Weight (kg) 91.4 kg 86.5 kg 88.905 kg     Body mass index is 29.76 kg/m.     General:  Well nourished, well developed, in no acute distress in bed at 30 degrees HEENT: normal Neck: no JVD Vascular: No carotid bruits; Distal  pulses 2+ bilaterally Cardiac:  normal S1, S2; RRR; no murmur  Lungs:  clear to auscultation bilaterally, no wheezing, rhonchi or rales  Abd: soft, nontender, no hepatomegaly  Ext: no edema Musculoskeletal:  No deformities, BUE and BLE strength normal and equal Skin: warm and  dry  Neuro:  CNs 2-12 intact, no focal abnormalities noted Psych:  Normal affect   EKG:  The EKG was personally reviewed and demonstrates: Intermittent BiV pacing.  PVCs. Telemetry:  Telemetry was personally reviewed and demonstrates: Intermittent BiV pacing  Relevant CV Studies:  February 17, 2022 echo EF 25 to 30% RV normal Dilated atria Mild MR Mild AI  February 01, 2022 remote interrogation showed a 0.9% burden of atrial fibrillation  Triage alert from October 16 reviewed and shows onset of atrial fibrillation starting in September 2023 with nearly 100% burden since onset.  He has a CRT device.  He is effectively CRT pacing 63% of the time.  Prior to the onset of his atrial fibrillation he was pacing 100% of the time.  OptiVol is elevated. Device shows the patient is in atrial fibrillation with a controlled ventricular rate and then seems to have a rapid acceleration in the ventricular rate.  Ventricular EGM's appear to be regular and very rapid.  The device tried to ATP during charging but ATP degenerated the rhythm into ventricular fibrillation.  The patient had a clean break of both atrial fibrillation and ventricular fibrillation.  Chest x-ray from April 14, 2022 reviewed and shows a well-positioned CRT-D device.   Laboratory Data:  High Sensitivity Troponin:   Recent Labs  Lab 04/21/22 2357 04/22/22 0218  TROPONINIHS 168* 151*     Chemistry Recent Labs  Lab 04/22/22 0557 04/23/22 0454 04/24/22 0413  NA 137 139 141  K 3.7 3.1* 3.8  CL 101 102 100  CO2 27 30 32  GLUCOSE 176* 144* 110*  BUN 12 21 28*  CREATININE 1.01 1.03 1.25*  CALCIUM 8.8* 8.9 9.4  MG 1.7 1.7 1.9  GFRNONAA >60 >60 >60   ANIONGAP 9 7 9     Recent Labs  Lab 04/21/22 2357  PROT 7.3  ALBUMIN 3.0*  AST 27  ALT 13  ALKPHOS 53  BILITOT 0.7   Lipids No results for input(s): "CHOL", "TRIG", "HDL", "LABVLDL", "LDLCALC", "CHOLHDL" in the last 168 hours.  Hematology Recent Labs  Lab 04/22/22 0557 04/23/22 0454 04/24/22 0413  WBC 5.7 9.7 8.1  RBC 4.22 4.21* 4.34  HGB 12.1* 12.2* 12.6*  HCT 38.2* 38.3* 39.3  MCV 90.5 91.0 90.6  MCH 28.7 29.0 29.0  MCHC 31.7 31.9 32.1  RDW 17.8* 17.7* 17.8*  PLT 277 326 329   Thyroid  Recent Labs  Lab 04/22/22 0557  TSH 4.961*    BNP Recent Labs  Lab 04/21/22 2356  BNP 878.6*    DDimer  Recent Labs  Lab 04/22/22 0218  DDIMER 1.30*     Radiology/Studies:  CT Head Wo Contrast  Result Date: 04/22/2022 CLINICAL DATA:  Altered mental status EXAM: CT HEAD WITHOUT CONTRAST TECHNIQUE: Contiguous axial images were obtained from the base of the skull through the vertex without intravenous contrast. RADIATION DOSE REDUCTION: This exam was performed according to the departmental dose-optimization program which includes automated exposure control, adjustment of the mA and/or kV according to patient size and/or use of iterative reconstruction technique. COMPARISON:  03/14/2022 FINDINGS: Brain: Mild atrophic changes and chronic white matter ischemic changes are seen. There is diffuse encephalomalacia in the left parietooccipital and temporal lobes consistent with prior infarct. These are stable from the prior study. No acute hemorrhage, acute infarction or space-occupying mass lesion is seen. Vascular: No hyperdense vessel or unexpected calcification. Skull: Normal. Negative for fracture or focal lesion. Sinuses/Orbits: No acute finding. Other: None. IMPRESSION: Chronic left MCA  infarct with encephalomalacia. No acute abnormality noted. Electronically Signed   By: Alcide Clever M.D.   On: 04/22/2022 02:05   DG Chest Port 1 View  Result Date: 04/22/2022 CLINICAL DATA:   Shortness of breath EXAM: PORTABLE CHEST 1 VIEW COMPARISON:  04/12/2022 FINDINGS: Cardiomegaly with mild perihilar edema. Mild left basilar opacity, likely atelectasis. No definite pleural effusions. No pneumothorax. Valve prosthesis. Left subclavian ICD. Postsurgical changes related to prior CABG. Median sternotomy. IMPRESSION: Cardiomegaly with mild perihilar edema. Left basilar opacity, likely atelectasis. Electronically Signed   By: Charline Bills M.D.   On: 04/22/2022 00:11     Assessment and Plan:    Mr. Lorri Frederick is a 61 year old man with a complex medical history including ischemic cardiomyopathy and ventricular tachycardia with a CRT-D in situ who presented to the hospital with shortness of breath and ICD shock.  He appears to be volume overloaded on physical exam and his OptiVol.  His atrial fibrillation is compounding the issues and leading to ineffective BiV pacing which is likely leading to worsening heart failure.  I think control of the atrial fibrillation and heart failure will help reduce the chances of recurrent ventricular fibrillation and ICD shocks.  #Ventricular fibrillation Received appropriate therapy x1 from his CRT-D.  I have recommended loading with IV amiodarone for 48 hours.  After he completes the IV load, recommend amiodarone 400 mg by mouth once daily.  He should be seen in follow-up in 6 to 8 weeks for blood work including a CMP, TSH and free T4.  Keep potassium greater than 4 and magnesium greater than 2  I would also recommend a left heart catheterization this admission to evaluate his coronary anatomy.  It is possible that when his atrial fibrillation rapidly conducted as it causes ischemia which degenerates into ventricular fibrillation.  #Atrial fibrillation Amiodarone as above. Heparin/NOAC for stroke prophylaxis     For questions or updates, please contact [facility] Please consult [email or web address] for contact info under    Signed, Lanier Prude, MD  04/24/2022 10:40 AM

## 2022-04-24 NOTE — Consult Note (Signed)
ANTICOAGULATION CONSULT NOTE - Initial Consult  Pharmacy Consult for Heparin infusion Indication: atrial fibrillation  Allergies  Allergen Reactions   Iodinated Contrast Media Shortness Of Breath   Penicillins Anaphylaxis and Shortness Of Breath    Respiratory  Tolerated cefuroxime on 01/06/16   Strawberry Extract Anaphylaxis   Cefepime Itching    Empiric antibiotic, developed pruritis.    Erythromycin Itching   Sulfa Antibiotics Itching, Nausea And Vomiting and Nausea Only    Patient Measurements: Height: 5\' 9"  (175.3 cm) Weight: 91.4 kg (201 lb 8 oz) IBW/kg (Calculated) : 70.7 Heparin Dosing Weight: 86.5 kg  Vital Signs: Temp: 97.7 F (36.5 C) (10/18 0519) Temp Source: Oral (10/18 0153) BP: 91/66 (10/18 0519) Pulse Rate: 70 (10/18 0519)  Labs: Recent Labs    04/21/22 2357 04/22/22 0218 04/22/22 0557 04/23/22 0454 04/24/22 0413  HGB 12.0*  --  12.1* 12.2* 12.6*  HCT 37.3*  --  38.2* 38.3* 39.3  PLT 339  --  277 326 329  APTT  --   --   --   --  78*  CREATININE 1.20  --  1.01 1.03 1.25*  TROPONINIHS 168* 151*  --   --   --      Estimated Creatinine Clearance: 67.6 mL/min (A) (by C-G formula based on SCr of 1.25 mg/dL (H)).   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 Pharmacy consulted to transition to IV heparin therapy for possible procedures  Date Time aPTT/HL Rate/Comment 10/18 0413 78s / --  Thera x1; 1300 un/hr      Goal of Therapy:  Heparin level 0.3-0.7 units/ml aPTT 66-102 seconds Monitor platelets by anticoagulation protocol: Yes   Plan:  aPTT therapeutic at 78s on 10/18 on 0413. Last Eliquis dose 10/17 0900. Continue heparin infusion at 1300 units/hr Recheck aPTT level in 6 hours and collect first Heparin level to assess for lab interference. Transition to Heparin level monitoring when therapeutic and correlating with aPTT Continue to monitor H&H and platelets daily while on heparin infusion  Lorna Dibble, PharmD, [place] Pharmacist 04/24/2022 7:26 AM

## 2022-04-24 NOTE — Consult Note (Signed)
ANTICOAGULATION CONSULT NOTE - Initial Consult  Pharmacy Consult for Heparin infusion Indication: atrial fibrillation  Allergies  Allergen Reactions   Iodinated Contrast Media Shortness Of Breath   Penicillins Anaphylaxis and Shortness Of Breath    Respiratory  Tolerated cefuroxime on 01/06/16   Strawberry Extract Anaphylaxis   Cefepime Itching    Empiric antibiotic, developed pruritis.    Erythromycin Itching   Sulfa Antibiotics Itching, Nausea And Vomiting and Nausea Only    Patient Measurements: Height: 5\' 9"  (175.3 cm) Weight: 91.4 kg (201 lb 8 oz) IBW/kg (Calculated) : 70.7 Heparin Dosing Weight: 86.5 kg  Vital Signs: Temp: 97.9 F (36.6 C) (10/18 0813) Temp Source: Oral (10/18 0153) BP: 103/64 (10/18 0813) Pulse Rate: 76 (10/18 0813)  Labs: Recent Labs    04/21/22 2357 04/22/22 0218 04/22/22 0557 04/23/22 0454 04/24/22 0413 04/24/22 1052  HGB 12.0*  --  12.1* 12.2* 12.6*  --   HCT 37.3*  --  38.2* 38.3* 39.3  --   PLT 339  --  277 326 329  --   APTT  --   --   --   --  78* 122*  HEPARINUNFRC  --   --   --   --   --  0.97*  CREATININE 1.20  --  1.01 1.03 1.25*  --   TROPONINIHS 168* 151*  --   --   --   --      Estimated Creatinine Clearance: 67.6 mL/min (A) (by C-G formula based on SCr of 1.25 mg/dL (H)).   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 Pharmacy consulted to transition to IV heparin therapy for possible procedures  Date Time aPTT/HL Rate/Comment 10/18 0413 78s / --  Thera x1; 1300 un/hr  10/18   1052 122s/0.97 Supratherapeutic   Goal of Therapy:  Heparin level 0.3-0.7 units/ml aPTT 66-102 seconds Monitor platelets by anticoagulation protocol: Yes   Plan:  aPTT supratherapeutic at 97s and 1st HL was 0.97 on 10/18 at 1052. Last Eliquis dose 10/17 0900. Reduce heparin infusion rate to 1150 units/hr Recheck aPTT/HL level in 6 hours Transition to Heparin level monitoring when therapeutic and correlating with aPTT Continue to monitor H&H and platelets daily while on heparin infusion  Aubery Lapping, PharmD Clinical Pharmacist 04/24/2022 11:52 AM

## 2022-04-24 NOTE — Consult Note (Signed)
ANTICOAGULATION CONSULT NOTE - Initial Consult  Pharmacy Consult for Heparin infusion Indication: atrial fibrillation  Allergies  Allergen Reactions   Iodinated Contrast Media Shortness Of Breath   Penicillins Anaphylaxis and Shortness Of Breath    Respiratory  Tolerated cefuroxime on 01/06/16   Strawberry Extract Anaphylaxis   Cefepime Itching    Empiric antibiotic, developed pruritis.    Erythromycin Itching   Sulfa Antibiotics Itching, Nausea And Vomiting and Nausea Only    Patient Measurements: Height: 5\' 9"  (175.3 cm) Weight: 91.4 kg (201 lb 8 oz) IBW/kg (Calculated) : 70.7 Heparin Dosing Weight: 86.5 kg  Vital Signs: Temp: 98.4 F (36.9 C) (10/18 1623) BP: 110/63 (10/18 1623) Pulse Rate: 80 (10/18 1623)  Labs: Recent Labs    04/21/22 2357 04/22/22 0218 04/22/22 0557 04/23/22 0454 04/24/22 0413 04/24/22 1052 04/24/22 1837  HGB 12.0*  --  12.1* 12.2* 12.6*  --   --   HCT 37.3*  --  38.2* 38.3* 39.3  --   --   PLT 339  --  277 326 329  --   --   APTT  --   --   --   --  78* 122* 99*  HEPARINUNFRC  --   --   --   --   --  0.97* 0.64  CREATININE 1.20  --  1.01 1.03 1.25*  --   --   TROPONINIHS 168* 151*  --   --   --   --   --      Estimated Creatinine Clearance: 67.6 mL/min (A) (by C-G formula based on SCr of 1.25 mg/dL (H)).   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 Pharmacy consulted to transition to IV heparin therapy for possible procedures  Date Time aPTT/HL Rate/Comment 10/18 0413 78s / --  Thera x1; 1300 un/hr  10/18   1052 122s/0.97 Supratherapeutic  10/18 1837 99s/0.64 Therapeutic x 1   Goal of Therapy:  Heparin level 0.3-0.7 units/ml aPTT 66-102 seconds Monitor platelets by anticoagulation protocol: Yes   Plan:  aPTT and heparin level therapeutic and correlating Continue heparin infusion rate at 1150 units/hr Recheck HL level in 6 hours to confirm  Continue to monitor H&H and platelets daily while on heparin infusion  Dorothe Pea, PharmD, BCPS Clinical Pharmacist   04/24/2022 7:04 PM

## 2022-04-25 ENCOUNTER — Other Ambulatory Visit: Payer: Self-pay

## 2022-04-25 ENCOUNTER — Encounter
Admission: EM | Disposition: A | Discharge status: Home Health | Payer: Self-pay | Source: Home / Self Care | Attending: Internal Medicine

## 2022-04-25 DIAGNOSIS — I4901 Ventricular fibrillation: Secondary | ICD-10-CM | POA: Diagnosis not present

## 2022-04-25 DIAGNOSIS — I2489 Other forms of acute ischemic heart disease: Secondary | ICD-10-CM

## 2022-04-25 DIAGNOSIS — I2510 Atherosclerotic heart disease of native coronary artery without angina pectoris: Secondary | ICD-10-CM | POA: Diagnosis not present

## 2022-04-25 DIAGNOSIS — I480 Paroxysmal atrial fibrillation: Secondary | ICD-10-CM | POA: Diagnosis not present

## 2022-04-25 DIAGNOSIS — J9601 Acute respiratory failure with hypoxia: Secondary | ICD-10-CM | POA: Diagnosis not present

## 2022-04-25 DIAGNOSIS — I214 Non-ST elevation (NSTEMI) myocardial infarction: Secondary | ICD-10-CM

## 2022-04-25 DIAGNOSIS — I5023 Acute on chronic systolic (congestive) heart failure: Secondary | ICD-10-CM | POA: Diagnosis not present

## 2022-04-25 HISTORY — PX: INTRAVASCULAR PRESSURE WIRE/FFR STUDY: CATH118243

## 2022-04-25 HISTORY — PX: RIGHT/LEFT HEART CATH AND CORONARY/GRAFT ANGIOGRAPHY: CATH118267

## 2022-04-25 LAB — BASIC METABOLIC PANEL
Anion gap: 6 (ref 5–15)
BUN: 29 mg/dL — ABNORMAL HIGH (ref 8–23)
CO2: 28 mmol/L (ref 22–32)
Calcium: 9.1 mg/dL (ref 8.9–10.3)
Chloride: 101 mmol/L (ref 98–111)
Creatinine, Ser: 1.15 mg/dL (ref 0.61–1.24)
GFR, Estimated: >60 mL/min (ref >60)
Glucose, Bld: 165 mg/dL — ABNORMAL HIGH (ref 70–99)
Potassium: 4.5 mmol/L (ref 3.5–5.1)
Sodium: 135 mmol/L (ref 135–145)

## 2022-04-25 LAB — CBC
HCT: 39.5 % (ref 39.0–52.0)
Hemoglobin: 12.9 g/dL — ABNORMAL LOW (ref 13.0–17.0)
MCH: 28.5 pg (ref 26.0–34.0)
MCHC: 32.7 g/dL (ref 30.0–36.0)
MCV: 87.4 fL (ref 80.0–100.0)
Platelets: 304 10*3/uL (ref 150–400)
RBC: 4.52 MIL/uL (ref 4.22–5.81)
RDW: 17.2 % — ABNORMAL HIGH (ref 11.5–15.5)
WBC: 6.2 10*3/uL (ref 4.0–10.5)
nRBC: 0 % (ref 0.0–0.2)

## 2022-04-25 LAB — POCT ACTIVATED CLOTTING TIME: Activated Clotting Time: 281 seconds

## 2022-04-25 LAB — HEPARIN LEVEL (UNFRACTIONATED): Heparin Unfractionated: 0.61 IU/mL (ref 0.30–0.70)

## 2022-04-25 LAB — PROTIME-INR
INR: 1.2 (ref 0.8–1.2)
Prothrombin Time: 15.5 seconds — ABNORMAL HIGH (ref 11.4–15.2)

## 2022-04-25 LAB — MAGNESIUM: Magnesium: 1.9 mg/dL (ref 1.7–2.4)

## 2022-04-25 SURGERY — RIGHT/LEFT HEART CATH AND CORONARY/GRAFT ANGIOGRAPHY
Anesthesia: Moderate Sedation

## 2022-04-25 MED ORDER — HEPARIN (PORCINE) 25000 UT/250ML-% IV SOLN: 1150.0000 [IU]/h | INTRAVENOUS | Status: DC

## 2022-04-25 MED ORDER — HEPARIN SODIUM (PORCINE) 1000 UNIT/ML IJ SOLN
INTRAMUSCULAR | Status: DC | PRN
  Administered 2022-04-25: 5000 [IU] via INTRAVENOUS
  Administered 2022-04-25: 4000 [IU] via INTRAVENOUS

## 2022-04-25 MED ORDER — LIDOCAINE HCL 1 % IJ SOLN
INTRAMUSCULAR | Status: AC
  Filled 2022-04-25: qty 20

## 2022-04-25 MED ORDER — SODIUM CHLORIDE 0.9% FLUSH
3.0000 mL | Freq: Two times a day (BID) | INTRAVENOUS | Status: DC
  Administered 2022-04-26 – 2022-04-30 (×9): 3 mL via INTRAVENOUS

## 2022-04-25 MED ORDER — VERAPAMIL HCL 2.5 MG/ML IV SOLN
INTRAVENOUS | Status: DC | PRN
  Administered 2022-04-25: 2.5 mg via INTRA_ARTERIAL

## 2022-04-25 MED ORDER — SODIUM CHLORIDE 0.9 % IV SOLN
250.0000 mL | INTRAVENOUS | Status: DC | PRN
  Administered 2022-04-25 (×2): 250 mL via INTRAVENOUS

## 2022-04-25 MED ORDER — IOHEXOL 300 MG/ML  SOLN
INTRAMUSCULAR | Status: DC | PRN
  Administered 2022-04-25: 65 mL

## 2022-04-25 MED ORDER — LIDOCAINE HCL (PF) 1 % IJ SOLN
INTRAMUSCULAR | Status: DC | PRN
  Administered 2022-04-25 (×2): 2 mL

## 2022-04-25 MED ORDER — HEPARIN (PORCINE) 25000 UT/250ML-% IV SOLN
1050.0000 [IU]/h | INTRAVENOUS | Status: AC
  Administered 2022-04-25: 1150 [IU]/h via INTRAVENOUS
  Filled 2022-04-25 (×2): qty 250

## 2022-04-25 MED ORDER — LABETALOL HCL 5 MG/ML IV SOLN: 10.0000 mg | INTRAVENOUS | Status: AC | PRN

## 2022-04-25 MED ORDER — FENTANYL CITRATE (PF) 100 MCG/2ML IJ SOLN
INTRAMUSCULAR | Status: AC
  Filled 2022-04-25: qty 2

## 2022-04-25 MED ORDER — MIDAZOLAM HCL 2 MG/2ML IJ SOLN
INTRAMUSCULAR | Status: AC
  Filled 2022-04-25: qty 2

## 2022-04-25 MED ORDER — HEPARIN (PORCINE) IN NACL 1000-0.9 UT/500ML-% IV SOLN
INTRAVENOUS | Status: AC
  Filled 2022-04-25: qty 1000

## 2022-04-25 MED ORDER — HEPARIN SODIUM (PORCINE) 1000 UNIT/ML IJ SOLN
INTRAMUSCULAR | Status: AC
  Filled 2022-04-25: qty 10

## 2022-04-25 MED ORDER — HYDRALAZINE HCL 20 MG/ML IJ SOLN: 10.0000 mg | INTRAMUSCULAR | Status: AC | PRN

## 2022-04-25 MED ORDER — MIDAZOLAM HCL 2 MG/2ML IJ SOLN
INTRAMUSCULAR | Status: DC | PRN
  Administered 2022-04-25: .5 mg via INTRAVENOUS

## 2022-04-25 MED ORDER — HEPARIN (PORCINE) IN NACL 2000-0.9 UNIT/L-% IV SOLN
INTRAVENOUS | Status: DC | PRN
  Administered 2022-04-25: 1000 mL

## 2022-04-25 MED ORDER — FUROSEMIDE 10 MG/ML IJ SOLN
40.0000 mg | Freq: Two times a day (BID) | INTRAMUSCULAR | Status: DC
  Administered 2022-04-25 – 2022-04-28 (×5): 40 mg via INTRAVENOUS
  Filled 2022-04-25 (×6): qty 4

## 2022-04-25 MED ORDER — FENTANYL CITRATE (PF) 100 MCG/2ML IJ SOLN
INTRAMUSCULAR | Status: DC | PRN
  Administered 2022-04-25: 12.5 ug via INTRAVENOUS

## 2022-04-25 MED ORDER — SODIUM CHLORIDE 0.9% FLUSH: 3.0000 mL | INTRAVENOUS | Status: DC | PRN

## 2022-04-25 MED ORDER — VERAPAMIL HCL 2.5 MG/ML IV SOLN
INTRAVENOUS | Status: AC
  Filled 2022-04-25: qty 2

## 2022-04-25 SURGICAL SUPPLY — 17 items
CATH BALLN WEDGE 5F 110CM (CATHETERS) IMPLANT
CATH INFINITI 5 FR IM (CATHETERS) IMPLANT
CATH INFINITI 5FR AL1 (CATHETERS) IMPLANT
CATH INFINITI 5FR JL4 (CATHETERS) IMPLANT
CATH LAUNCHER 6FR JR4 (CATHETERS) IMPLANT
DEVICE RAD TR BAND REGULAR (VASCULAR PRODUCTS) IMPLANT
DRAPE BRACHIAL (DRAPES) IMPLANT
GLIDESHEATH SLEND SS 6F .021 (SHEATH) IMPLANT
GUIDEWIRE INQWIRE 1.5J.035X260 (WIRE) IMPLANT
GUIDEWIRE PRESS OMNI 185 ST (WIRE) IMPLANT
INQWIRE 1.5J .035X260CM (WIRE) ×1
KIT ENCORE 26 ADVANTAGE (KITS) IMPLANT
PACK CARDIAC CATH (CUSTOM PROCEDURE TRAY) ×1 IMPLANT
PROTECTION STATION PRESSURIZED (MISCELLANEOUS) ×1
SET ATX SIMPLICITY (MISCELLANEOUS) IMPLANT
SHEATH GLIDE SLENDER 4/5FR (SHEATH) IMPLANT
STATION PROTECTION PRESSURIZED (MISCELLANEOUS) IMPLANT

## 2022-04-25 NOTE — Progress Notes (Signed)
[facility] Interventional Cardiology Note  Date: 04/25/22  Time: 8:30 AM  Patient has been referred for cardiac catheterization in the setting of acute on chronic HFrEF, VT/VF, and NSTEMI.  I have spoken with his legal guardian at [facility]) and discussed the risks and benefits of the procedure at length.  She has provided verbal consent for right and left heart catheterization with possible PCI.  She also confirms that the patient is full code.  We will proceed with catheterization this afternoon, as planned.  He is receiving premdication for history of iodinated contrast allergy.  Shared Decision Making/Informed Consent The risks [stroke (1 in 1000), death (1 in 1000), kidney failure [usually temporary] (1 in 500), bleeding (1 in 200), allergic reaction [possibly serious] (1 in 200)], benefits (diagnostic support and management of coronary artery disease) and alternatives of a cardiac catheterization were discussed in detail with Carl [name] and he is willing to proceed.  Carl Bush, MD [facility]

## 2022-04-25 NOTE — Progress Notes (Signed)
Nutrition Follow-up  DOCUMENTATION CODES:   Not applicable  INTERVENTION:   -Continue Ensure Enlive po BID, each supplement provides 350 kcal and 20 grams of protein -Continue MVI with minerals daily -Liberalize diet to 2 gram sodium with 2 L fluid restriction  NUTRITION DIAGNOSIS:   Increased nutrient needs related to chronic illness (CHF) as evidenced by estimated needs.  Ongoing  GOAL:   Patient will meet greater than or equal to 90% of their needs  Progressing   MONITOR:   PO intake, Supplement acceptance  REASON FOR ASSESSMENT:   Rounds    ASSESSMENT:   Pt with medical history significant for bipolar 1 disorder, systolic CHF, COPD, depression, hypertension, coronary artery disease, seizure disorder and CVA, who presented with acute onset of worsening dyspnea at rest with orthopnea and lower extremity edema.  10/19- s/p lt heart cath and coronary angiography  Reviewed I/O's: +1.4 L x 24 hours and +1.1 L since admission  UOP: 200 ml x 24 hours   Pt resting quietly at time of visit. He did not arouse to voice or touch.   Pt currently NPO for cath today. He was previously on a 2 gram sodium diet, consuming 75-100% of meals. Pt also enjoys drinking Ensure supplements. Noted pt has been advanced to a heart healthy diet with 2 L fluid restriction.   Medications reviewed and include lasix.   Labs reviewed: CBGS: 182 (inpatient orders for glycemic control are none).    NUTRITION - FOCUSED PHYSICAL EXAM:  Flowsheet Row Most Recent Value  Orbital Region No depletion  Upper Arm Region No depletion  Thoracic and Lumbar Region No depletion  Buccal Region No depletion  Temple Region No depletion  Clavicle Bone Region No depletion  Clavicle and Acromion Bone Region No depletion  Scapular Bone Region No depletion  Dorsal Hand No depletion  Patellar Region No depletion  Anterior Thigh Region No depletion  Posterior Calf Region No depletion  Edema (RD Assessment)  Mild  Hair Reviewed  Eyes Reviewed  Mouth Reviewed  Skin Reviewed  Nails Reviewed       Diet Order:   Diet Order             Diet Heart Room service appropriate? Yes; Fluid consistency: Thin; Fluid restriction: 2000 mL Fluid  Diet effective now                   EDUCATION NEEDS:   No education needs have been identified at this time  Skin:  Skin Assessment: Reviewed RN Assessment  Last BM:  04/25/22  Height:   Ht Readings from Last 1 Encounters:  04/25/22 5\' 9"  (1.753 m)    Weight:   Wt Readings from Last 1 Encounters:  04/25/22 84.3 kg    Ideal Body Weight:  72.7 kg  BMI:  Body mass index is 27.44 kg/m.  Estimated Nutritional Needs:   Kcal:  2000-2200  Protein:  105-120 grams  Fluid:  > 2 L    Loistine Chance, RD, LDN, [place] Registered Dietitian II Certified Diabetes Care and Education Specialist Please refer to [facility] for RD and/or RD on-call/weekend/after hours pager

## 2022-04-25 NOTE — Progress Notes (Signed)
Rounding Note    Patient Name: Carl [name] Date of Encounter: 04/25/2022  [facility] Cardiologist: Lorine Bears, MD   Subjective   Patient seen on a.m. rounds.  More talkative today.  Denies any chest pain or shortness of breath.  Has continued on amiodarone drip without difficulty.  Is scheduled for left heart catheterization today.  Inpatient Medications    Scheduled Meds:  [MAR Hold] aspirin EC  81 mg Oral Daily   [MAR Hold] atorvastatin  40 mg Oral Daily   [MAR Hold] divalproex  500 mg Oral BID   [MAR Hold] escitalopram  10 mg Oral QHS   [MAR Hold] ezetimibe  10 mg Oral Daily   [MAR Hold] feeding supplement  237 mL Oral BID BM   [MAR Hold] hydrOXYzine  10 mg Oral BID   [MAR Hold] levETIRAcetam  250 mg Oral BID   [MAR Hold] levothyroxine  37.5 mcg Oral Q0600   [MAR Hold] losartan  12.5 mg Oral Daily   [MAR Hold] metoprolol succinate  12.5 mg Oral QHS   [MAR Hold] multivitamin with minerals  1 tablet Oral Daily   [MAR Hold] pantoprazole  40 mg Oral Daily   [MAR Hold] sodium chloride flush  3 mL Intravenous Q12H   [MAR Hold] tiotropium  18 mcg Inhalation Daily   Continuous Infusions:  sodium chloride     amiodarone 30 mg/hr (04/25/22 0728)   heparin Stopped (04/25/22 1329)   PRN Meds: sodium chloride, [MAR Hold] acetaminophen **OR** [MAR Hold] acetaminophen, [MAR Hold] albuterol, [MAR Hold] magnesium hydroxide, [MAR Hold] ondansetron **OR** [MAR Hold] ondansetron (ZOFRAN) IV, sodium chloride flush, [MAR Hold] traMADol, [MAR Hold] traZODone   Vital Signs    Vitals:   04/25/22 0520 04/25/22 0904 04/25/22 1142 04/25/22 1255  BP:  95/78 103/67 98/62  Pulse:  63 60 60  Resp: (!) 21 18 19 20   Temp:  (!) 97.5 F (36.4 C) 97.6 F (36.4 C) 97.6 F (36.4 C)  TempSrc:   Oral Oral  SpO2:  99% 100% 94%  Weight:    84.3 kg  Height:    5\' 9"  (1.753 m)    Intake/Output Summary (Last 24 hours) at 04/25/2022 1333 Last data filed at 04/25/2022 0000 Gross per  24 hour  Intake 1078.13 ml  Output 200 ml  Net 878.13 ml      04/25/2022   12:55 PM 04/25/2022    4:53 AM 04/24/2022    5:00 AM  Last 3 Weights  Weight (lbs) 185 lb 13.6 oz 185 lb 13.6 oz 201 lb 8 oz  Weight (kg) 84.3 kg 84.3 kg 91.4 kg      Telemetry    A sensed and V paced- Personally Reviewed  ECG    No new tracings- Personally Reviewed  Physical Exam   GEN: No acute distress.   Neck: No JVD Cardiac: RRR, no murmurs, rubs, or gallops.  Respiratory: Diminished to auscultation bilaterally.  Rations are unlabored at rest on room air currently GI: Soft, nontender, obese, non-distended  MS: Trace edema; No deformity. Neuro:  Nonfocal  Psych: Normal affect   Labs    High Sensitivity Troponin:   Recent Labs  Lab 04/21/22 2357 04/22/22 0218  TROPONINIHS 168* 151*     Chemistry Recent Labs  Lab 04/21/22 2357 04/22/22 0557 04/23/22 0454 04/24/22 0413 04/25/22 0528  NA 136   < > 139 141 135  K 2.7*   < > 3.1* 3.8 4.5  CL 99   < >  102 100 101  CO2 27   < > 30 32 28  GLUCOSE 153*   < > 144* 110* 165*  BUN 11   < > 21 28* 29*  CREATININE 1.20   < > 1.03 1.25* 1.15  CALCIUM 9.2   < > 8.9 9.4 9.1  MG  --    < > 1.7 1.9 1.9  PROT 7.3  --   --   --   --   ALBUMIN 3.0*  --   --   --   --   AST 27  --   --   --   --   ALT 13  --   --   --   --   ALKPHOS 53  --   --   --   --   BILITOT 0.7  --   --   --   --   GFRNONAA >60   < > >60 >60 >60  ANIONGAP 10   < > 7 9 6    < > = values in this interval not displayed.    Lipids No results for input(s): "CHOL", "TRIG", "HDL", "LABVLDL", "LDLCALC", "CHOLHDL" in the last 168 hours.  Hematology Recent Labs  Lab 04/23/22 0454 04/24/22 0413 04/25/22 0528  WBC 9.7 8.1 6.2  RBC 4.21* 4.34 4.52  HGB 12.2* 12.6* 12.9*  HCT 38.3* 39.3 39.5  MCV 91.0 90.6 87.4  MCH 29.0 29.0 28.5  MCHC 31.9 32.1 32.7  RDW 17.7* 17.8* 17.2*  PLT 326 329 304   Thyroid  Recent Labs  Lab 04/22/22 0557  TSH 4.961*    BNP Recent  Labs  Lab 04/21/22 2356  BNP 878.6*    DDimer  Recent Labs  Lab 04/22/22 0218  DDIMER 1.30*     Radiology    No results found.  Cardiac Studies   TTE 02/16/2022 1. Left ventricular ejection fraction, by estimation, is 25 to 30%. The  left ventricle has severely decreased function. The left ventricle  demonstrates regional wall motion abnormalities (see scoring  diagram/findings for description). The left  ventricular internal cavity size was moderately dilated. There is mild  concentric left ventricular hypertrophy. Left ventricular diastolic  parameters are indeterminate. Elevated left ventricular end-diastolic  pressure.   2. Right ventricular systolic function is normal. The right ventricular  size is normal.   3. Left atrial size was severely dilated.   4. Right atrial size was mildly dilated.   5. The mitral valve is normal in structure. Mild mitral valve  regurgitation. No evidence of mitral stenosis.   6. The aortic valve is tricuspid. Aortic valve regurgitation is mild. No  aortic stenosis is present.   7. The inferior vena cava is normal in size with <50% respiratory  variability, suggesting right atrial pressure of 8 mmHg.  Patient Profile     61 y.o. male history of CAD/CABG, ischemic cardiomyopathy status post ICD, COPD, bipolar disorder presented with shortness of breath, leg edema and ICD shock secondary to V. tach/V-fib and is being seen and evaluated for HFrEF and VT.  Assessment & Plan    Ischemic cardiomyopathy EF 25-30% -Multiple admissions in the last few months presented with worsening shortness of breath, Lasix had been recently increased to 60 mg daily -Continued on losartan and Toprol-XL -BNP 878 -Furosemide IV twice daily -Daily BMP while receiving diuretic therapy -Daily weight, I&O, low-sodium diet  V. tach/V-fib status post ICD shock -Interrogation done in the ER -EP consultation completed yesterday and patient  was started on amiodarone  drip to be transition to oral amiodarone after 48 hours of IV infusion.  After he completes IV load recommend amiodarone 400 mg by mouth once daily, he will need to follow-up in 6 to 8 weeks for blood work of CMP TSH and a free T4 at that time. -Keep potassium level above 4 magnesium 2 or better  CAD/CABG -High-sensitivity troponin peaked at 168 -Currently chest pain-free -With ICD shock he is scheduled for a left heart catheterization -Continue aspirin, atorvastatin, Zetia, losartan, Toprol-XL  Paroxysmal atrial fibrillation -Currently paced rhythm -On IV heparin in place of apixaban until after heart catheterization is completed -Continue aspirin, and Toprol secondary 12.5 mg daily     For questions or updates, please contact [facility] Please consult [email or web address] for contact info under        Signed, Shaquel Josephson, NP  04/25/2022, 1:33 PM

## 2022-04-25 NOTE — Progress Notes (Signed)
Patient taken off the floor via bed to cath lab. 02 at 2l. Amiodarone and heparin drip infusing. No distress noted

## 2022-04-25 NOTE — Consult Note (Signed)
ANTICOAGULATION CONSULT NOTE   Pharmacy Consult for Heparin infusion Indication: atrial fibrillation  Allergies  Allergen Reactions   Iodinated Contrast Media Shortness Of Breath   Penicillins Anaphylaxis and Shortness Of Breath    Respiratory  Tolerated cefuroxime on 01/06/16   Strawberry Extract Anaphylaxis   Cefepime Itching    Empiric antibiotic, developed pruritis.    Erythromycin Itching   Sulfa Antibiotics Itching, Nausea And Vomiting and Nausea Only    Patient Measurements: Height: 5\' 9"  (175.3 cm) Weight: 91.4 kg (201 lb 8 oz) IBW/kg (Calculated) : 70.7 Heparin Dosing Weight: 86.5 kg  Vital Signs: Temp: 98.4 F (36.9 C) (10/19 0000) Temp Source: Oral (10/19 0000) BP: 101/61 (10/19 0000) Pulse Rate: 64 (10/19 0000)  Labs: Recent Labs    04/22/22 0218 04/22/22 0557 04/22/22 0557 04/23/22 0454 04/24/22 0413 04/24/22 1052 04/24/22 1837 04/25/22 0053  HGB  --  12.1*   < > 12.2* 12.6*  --   --   --   HCT  --  38.2*  --  38.3* 39.3  --   --   --   PLT  --  277  --  326 329  --   --   --   APTT  --   --   --   --  78* 122* 99*  --   HEPARINUNFRC  --   --   --   --   --  0.97* 0.64 0.61  CREATININE  --  1.01  --  1.03 1.25*  --   --   --   TROPONINIHS 151*  --   --   --   --   --   --   --    < > = values in this interval not displayed.     Estimated Creatinine Clearance: 67.6 mL/min (A) (by C-G formula based on SCr of 1.25 mg/dL (H)).   Medical History: Past Medical History:  Diagnosis Date   Acute pulmonary edema ([place]) 2017   Acute respiratory failure with hypoxia ([place]) 2017   Bipolar 1 disorder (HCC)    CHF (congestive heart failure) (HCC)    Chronic kidney disease    COPD (chronic obstructive pulmonary disease) (HCC)    Depression    Hypertension    Myocardial infarction [facility])    NSTEMI (non-ST elevated myocardial infarction) (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 Pharmacy consulted to transition to IV heparin therapy for possible procedures  Date Time aPTT/HL Rate/Comment 10/18 0413 78s / --  Thera x1; 1300 un/hr  10/18   1052 122s/0.97 Supratherapeutic  10/18 1837 99s/0.64 Therapeutic x 1 10/19 0053 0.61  Therapeutic x 2  Goal of Therapy:  Heparin level 0.3-0.7 units/ml aPTT 66-102 seconds Monitor platelets by anticoagulation protocol: Yes   Plan:  Continue heparin infusion rate at 1150 units/hr Recheck HL daily w/ AM labs while therapeutic Continue to monitor H&H and platelets daily while on heparin infusion  Renda Rolls, PharmD, [facility] 04/25/2022 1:28 AM

## 2022-04-25 NOTE — H&P (View-Only) (Signed)
[facility] Interventional Cardiology Note  Date: 04/25/22  Time: 8:30 AM  Patient has been referred for cardiac catheterization in the setting of acute on chronic HFrEF, VT/VF, and NSTEMI.  I have spoken with his legal guardian at [facility] ([name]) and discussed the risks and benefits of the procedure at length.  She has provided verbal consent for right and left heart catheterization with possible PCI.  She also confirms that the patient is full code.  We will proceed with catheterization this afternoon, as planned.  He is receiving premdication for history of iodinated contrast allergy.  Shared Decision Making/Informed Consent The risks [stroke (1 in 1000), death (1 in 1000), kidney failure [usually temporary] (1 in 500), bleeding (1 in 200), allergic reaction [possibly serious] (1 in 200)], benefits (diagnostic support and management of coronary artery disease) and alternatives of a cardiac catheterization were discussed in detail with Mr. Lagace and he is willing to proceed.  Diedra Sinor, MD [facility]   

## 2022-04-25 NOTE — Progress Notes (Signed)
[facility] [place] NAME: Ames Hoban    MR#:  [id number]  DATE OF BIRTH:  [birth year]  SUBJECTIVE:  family at bedside. Patient has a legal guardian. Denies any chest pain or shortness of breath. More talkative today   VITALS:  Blood pressure 98/62, pulse 60, temperature 97.6 F (36.4 C), temperature source Oral, resp. rate 20, height 5\' 9"  (1.753 m), weight 84.3 kg, SpO2 94 %.  PHYSICAL EXAMINATION:   GENERAL:  61 y.o.-year-old patient lying in the bed with no acute distress.  LUNGS: Normal breath sounds bilaterally, no wheezing CARDIOVASCULAR: S1, S2 normal. No murmurs,   ABDOMEN: Soft, nontender, nondistended. Bowel sounds present.  EXTREMITIES: + edema b/l.    NEUROLOGIC: nonfocal  patient is alert and awake SKIN: No obvious rash, lesion, or ulcer.   LABORATORY PANEL:  CBC Recent Labs  Lab 04/25/22 0528  WBC 6.2  HGB 12.9*  HCT 39.5  PLT 304    Chemistries  Recent Labs  Lab 04/21/22 2357 04/22/22 0557 04/25/22 0528  NA 136   < > 135  K 2.7*   < > 4.5  CL 99   < > 101  CO2 27   < > 28  GLUCOSE 153*   < > 165*  BUN 11   < > 29*  CREATININE 1.20   < > 1.15  CALCIUM 9.2   < > 9.1  MG  --    < > 1.9  AST 27  --   --   ALT 13  --   --   ALKPHOS 53  --   --   BILITOT 0.7  --   --    < > = values in this interval not displayed.    Assessment and Plan  Osinachi Hanover is a 61 y.o. Caucasian male with medical history significant for bipolar 1 disorder, systolic CHF, COPD, depression, hypertension, coronary artery disease, seizure disorder and CVA, who presented to the emergency room with acute onset of worsening dyspnea at rest with orthopnea and lower extremity edema.     * Acute respiratory failure with hypoxia (HCC) --In the ED, respiratory rate of 31 and pulse oximetry was 84% on room air, 96% on 3 L of O2 by nasal cannula. --2/2 CHF exacerbation.  CXR showed Cardiomegaly with mild perihilar edema.   --Continue  supplemental O2 to keep sats >=90%, wean as tolerated --received lasix   NSVT (nonsustained ventricular tachycardia) ([place]) --A note from 10/16 documented Device alert for VF with successful therapy.  Tele showed pt having frequent short stretches of VTach. --cardiology consult cont BB, IV heparin gtt for now--change to po eliqus at d/c  Acute on chronic HFrEF (heart failure with reduced ejection fraction) (HCC) --LVEF 25-30%, with ICD in place.  CXR showed Cardiomegaly with mild perihilar edema.   --Lasix IV --cont Toprol, losartan --cardiology consult recommends LHF   Hypokalemia --monitor and replete PRN   Hypothyroidism -Continue Synthroid   Bipolar 1 disorder ([place]) - continue Seroquel and Lexapro.   Paroxysmal atrial fibrillation (HCC) - continue Eliquis  --cont digoxin and Toprol   History of seizure -continue Depakote ER and Keppra.   Dyslipidemia -continue statin therapy.     DVT prophylaxis: [id number] gtt Code Status: Full code  Family Communication: dr end d/w legal guardian today Level of care: Progressive Dispo:   The patient is from: home Anticipated d/c is to: home Anticipated d/c date is: 2-3 days Patient currently is  not medically ready to d/c due to: Vtach pending cardio/EP eval, LHF today        TOTAL TIME TAKING CARE OF THIS PATIENT: 35 minutes.  >50% time spent on counselling and coordination of care  Note: This dictation was prepared with Dragon dictation along with smaller phrase technology. Any transcriptional errors that result from this process are unintentional.  Fritzi Mandes M.D    [facility]   CC: Primary care physician; [facility]

## 2022-04-25 NOTE — Progress Notes (Signed)
[facility]  - [place] [facility]   PATIENT NAME: Carl [name]    MR#:  [id number]  DATE OF BIRTH:  Jun 18, 1961  SUBJECTIVE:      VITALS:  Blood pressure 98/62, pulse 60, temperature 97.6 F (36.4 C), temperature source Oral, resp. rate 20, height 5\' 9"  (1.753 m), weight 84.3 kg, SpO2 94 %.  PHYSICAL EXAMINATION:   GENERAL:  61 y.o.-year-old patient lying in the bed with no acute distress.  LUNGS: Normal breath sounds bilaterally, no wheezing CARDIOVASCULAR: S1, S2 normal. No murmurs,   ABDOMEN: Soft, nontender, nondistended. Bowel sounds present.  EXTREMITIES: No  edema b/l.    NEUROLOGIC: nonfocal  patient is alert and awake SKIN: No obvious rash, lesion, or ulcer.   LABORATORY PANEL:  CBC Recent Labs  Lab 04/25/22 0528  WBC 6.2  HGB 12.9*  HCT 39.5  PLT 304    Chemistries  Recent Labs  Lab 04/21/22 2357 04/22/22 0557 04/25/22 0528  NA 136   < > 135  K 2.7*   < > 4.5  CL 99   < > 101  CO2 27   < > 28  GLUCOSE 153*   < > 165*  BUN 11   < > 29*  CREATININE 1.20   < > 1.15  CALCIUM 9.2   < > 9.1  MG  --    < > 1.9  AST 27  --   --   ALT 13  --   --   ALKPHOS 53  --   --   BILITOT 0.7  --   --    < > = values in this interval not displayed.   Cardiac Enzymes No results for input(s): "TROPONINI" in the last 168 hours. RADIOLOGY:  No results found.  Assessment and Plan  Carl [name] is a 61 y.o. Caucasian male with medical history significant for bipolar 1 disorder, systolic CHF, COPD, depression, hypertension, coronary artery disease, seizure disorder and CVA, who presented to the emergency room with acute onset of worsening dyspnea at rest with orthopnea and lower extremity edema.     Acute respiratory failure with hypoxia (HCC) --In the ED, respiratory rate of 31 and pulse oximetry was 84% on room air, 96% on 3 L of O2 by nasal cannula. --2/2 CHF exacerbation.  CXR showed Cardiomegaly with mild perihilar edema.   --Continue  supplemental O2 to keep sats >=90%, wean as tolerated --received lasix IV --good uop   NSVT (nonsustained ventricular tachycardia) (HCC) --A note from 10/16 documented Device alert for VF with successful therapy.  Tele showed pt having frequent short stretches of VTach. --cardiology consult cont BB, IV heparin gtt for now--change to po eliqus at d/c   Acute on chronic HFrEF (heart failure with reduced ejection fraction) (HCC) --LVEF 25-30%, with ICD in place.  CXR showed Cardiomegaly with mild perihilar edema.   --Lasix IV --cont Toprol, losartan --cardiology consult recommends LHF   Hypokalemia --monitor and replete PRN   Hypothyroidism -Continue Synthroid   Bipolar 1 disorder (HCC) - continue Seroquel and Lexapro.   Paroxysmal atrial fibrillation (HCC) - continue Eliquis  --cont digoxin and Toprol   History of seizure -continue Depakote ER and Keppra.   Dyslipidemia -continue statin therapy.     DVT prophylaxis: 11/16 gtt Code Status: Full code  Family Communication: none today Level of care: Progressive Dispo:   The patient is from: home Anticipated d/c is to: home Anticipated d/c date is: 2-3 days Patient currently is  not medically ready to d/c due to: Vtach pending cardio/EP eval, LHF         TOTAL TIME TAKING CARE OF THIS PATIENT: 35 minutes.  >50% time spent on counselling and coordination of care  Note: This dictation was prepared with Dragon dictation along with smaller phrase technology. Any transcriptional errors that result from this process are unintentional.  Fritzi Mandes M.D    [facility]   CC: Primary care physician; [facility]

## 2022-04-25 NOTE — Interval H&P Note (Signed)
History and Physical Interval Note:  04/25/2022 1:37 PM  Carl [name]  has presented today for surgery, with the diagnosis of NSTEMI, acute on chronic HFrEF, and ventriculiar fibrillation s/p shock.  The various methods of treatment have been discussed with the patient and family. After consideration of risks, benefits and other options for treatment, the patient has consented to  Procedure(s): LEFT HEART CATH AND CORONARY ANGIOGRAPHY (N/A) as a surgical intervention.  The patient's history has been reviewed, patient examined, no change in status, stable for surgery.  I have reviewed the patient's chart and labs.  Questions were answered to the patient's satisfaction.    Cath Lab Visit (complete for each Cath Lab visit)  Clinical Evaluation Leading to the Procedure:   ACS: Yes.    Non-ACS:  N/A  Carl [name]

## 2022-04-26 ENCOUNTER — Encounter: Payer: Self-pay | Admitting: Internal Medicine

## 2022-04-26 DIAGNOSIS — J9601 Acute respiratory failure with hypoxia: Secondary | ICD-10-CM | POA: Diagnosis not present

## 2022-04-26 DIAGNOSIS — I5023 Acute on chronic systolic (congestive) heart failure: Secondary | ICD-10-CM | POA: Diagnosis not present

## 2022-04-26 LAB — POCT I-STAT 7, (LYTES, BLD GAS, ICA,H+H)
Acid-Base Excess: 3 mmol/L — ABNORMAL HIGH (ref 0.0–2.0)
Bicarbonate: 27.9 mmol/L (ref 20.0–28.0)
Calcium, Ion: 1.28 mmol/L (ref 1.15–1.40)
HCT: 38 % — ABNORMAL LOW (ref 39.0–52.0)
Hemoglobin: 12.9 g/dL — ABNORMAL LOW (ref 13.0–17.0)
O2 Saturation: 94 %
Potassium: 4.3 mmol/L (ref 3.5–5.1)
Sodium: 134 mmol/L — ABNORMAL LOW (ref 135–145)
TCO2: 29 mmol/L (ref 22–32)
pCO2 arterial: 42.4 mmHg (ref 32–48)
pH, Arterial: 7.426 (ref 7.35–7.45)
pO2, Arterial: 71 mmHg — ABNORMAL LOW (ref 83–108)

## 2022-04-26 LAB — POCT I-STAT EG7
Acid-Base Excess: 4 mmol/L — ABNORMAL HIGH (ref 0.0–2.0)
Bicarbonate: 29.5 mmol/L — ABNORMAL HIGH (ref 20.0–28.0)
Calcium, Ion: 1.25 mmol/L (ref 1.15–1.40)
HCT: 36 % — ABNORMAL LOW (ref 39.0–52.0)
Hemoglobin: 12.2 g/dL — ABNORMAL LOW (ref 13.0–17.0)
O2 Saturation: 61 %
Potassium: 4.3 mmol/L (ref 3.5–5.1)
Sodium: 135 mmol/L (ref 135–145)
TCO2: 31 mmol/L (ref 22–32)
pCO2, Ven: 46.3 mmHg (ref 44–60)
pH, Ven: 7.413 (ref 7.25–7.43)
pO2, Ven: 32 mmHg (ref 32–45)

## 2022-04-26 LAB — BASIC METABOLIC PANEL
Anion gap: 9 (ref 5–15)
BUN: 34 mg/dL — ABNORMAL HIGH (ref 8–23)
CO2: 28 mmol/L (ref 22–32)
Calcium: 9.2 mg/dL (ref 8.9–10.3)
Chloride: 101 mmol/L (ref 98–111)
Creatinine, Ser: 1.26 mg/dL — ABNORMAL HIGH (ref 0.61–1.24)
GFR, Estimated: >60 mL/min (ref >60)
Glucose, Bld: 181 mg/dL — ABNORMAL HIGH (ref 70–99)
Potassium: 3.9 mmol/L (ref 3.5–5.1)
Sodium: 138 mmol/L (ref 135–145)

## 2022-04-26 LAB — CBC
HCT: 35.9 % — ABNORMAL LOW (ref 39.0–52.0)
Hemoglobin: 11.9 g/dL — ABNORMAL LOW (ref 13.0–17.0)
MCH: 29.2 pg (ref 26.0–34.0)
MCHC: 33.1 g/dL (ref 30.0–36.0)
MCV: 88 fL (ref 80.0–100.0)
Platelets: 282 10*3/uL (ref 150–400)
RBC: 4.08 MIL/uL — ABNORMAL LOW (ref 4.22–5.81)
RDW: 17.3 % — ABNORMAL HIGH (ref 11.5–15.5)
WBC: 11.2 10*3/uL — ABNORMAL HIGH (ref 4.0–10.5)
nRBC: 0 % (ref 0.0–0.2)

## 2022-04-26 LAB — HEPARIN LEVEL (UNFRACTIONATED)
Heparin Unfractionated: 0.55 IU/mL (ref 0.30–0.70)
Heparin Unfractionated: 0.65 IU/mL (ref 0.30–0.70)
Heparin Unfractionated: 0.77 IU/mL — ABNORMAL HIGH (ref 0.30–0.70)

## 2022-04-26 LAB — MAGNESIUM: Magnesium: 2.1 mg/dL (ref 1.7–2.4)

## 2022-04-26 MED ORDER — APIXABAN 5 MG PO TABS
5.0000 mg | ORAL_TABLET | Freq: Two times a day (BID) | ORAL | Status: DC
  Administered 2022-04-26 – 2022-04-30 (×8): 5 mg via ORAL
  Filled 2022-04-26 (×8): qty 1

## 2022-04-26 MED ORDER — AMIODARONE HCL 200 MG PO TABS
400.0000 mg | ORAL_TABLET | Freq: Every day | ORAL | Status: DC
  Administered 2022-04-26 – 2022-04-30 (×5): 400 mg via ORAL
  Filled 2022-04-26 (×5): qty 2

## 2022-04-26 NOTE — Progress Notes (Signed)
  PROGRESS NOTE    Carl [name]  ZOX:[id number] DOB: [birth year] DOA: 04/22/2022 PCP: [facility]  [id number]  LOS: 4 days   Brief hospital course:   Assessment & Plan: Carl [name] is a 61 y.o. Caucasian male with medical history significant for bipolar 1 disorder, systolic CHF, COPD, depression, hypertension, coronary artery disease, seizure disorder and CVA, who presented to the emergency room with acute onset of worsening dyspnea at rest with orthopnea and lower extremity edema.     * Acute respiratory failure with hypoxia (HCC) --In the ED, respiratory rate of 31 and pulse oximetry was 84% on room air, 96% on 3 L of O2 by nasal cannula. --2/2 CHF exacerbation.  CXR showed Cardiomegaly with mild perihilar edema.   --Continue supplemental O2 to keep sats >=90%, wean as tolerated --cont IV lasix 40 BID, per cardio   NSVT (nonsustained ventricular tachycardia) ([place]) --A note from 10/16 documented Device alert for VF with successful therapy.  Tele showed pt having frequent short stretches of VTach. --cardiology consult  --cont amiodarone --cont Toprol   Acute on chronic HFrEF (heart failure with reduced ejection fraction) (HCC) --LVEF 25-30%, with ICD in place.  CXR showed Cardiomegaly with mild perihilar edema.   --right and left heart cath showed mod-severe multivessel CAD, mod-severe pulm artery pressures Plan: --cont IV lasix 40 BID --cont Toprol, losartan   Hx of CAD s/p CABG --right and left heart cath showed mod-severe multivessel CAD, patent grafts overall --cont ASA and statin  Hypokalemia --monitor and replete PRN   Hypothyroidism -Continue Synthroid   Bipolar 1 disorder ([place]) - continue Seroquel and Lexapro.   Paroxysmal atrial fibrillation (HCC) - continue Eliquis  --cont amio and toprol   History of seizure -continue Depakote ER and Keppra.   Dyslipidemia -continue statin therapy.  Cognitive deficit --has DSS legal guardian     DVT prophylaxis: [id number] Code Status: Full code  Family Communication:  Level of care: Progressive Dispo:   The patient is from: [facility] Anticipated d/c is to: to be determined Anticipated d/c date is: 1-2 days   Subjective and Interval History:  Pt denied dyspnea.  Talkative, but not coherent, stuck in his own train of thoughts.   Objective: Vitals:   04/26/22 0359 04/26/22 0837 04/26/22 1222 04/26/22 1623  BP: 99/63 (!) 99/59 107/70 (!) 109/56  Pulse: 60 61 68 80  Resp: 18 20 18 20   Temp: (!) 97.5 F (36.4 C) 97.7 F (36.5 C) 97.8 F (36.6 C) 98.5 F (36.9 C)  TempSrc:  Oral Oral   SpO2: 98% 100% 99% 95%  Weight:      Height:        Intake/Output Summary (Last 24 hours) at 04/26/2022 1629 Last data filed at 04/26/2022 1200 Gross per 24 hour  Intake 1287.6 ml  Output 2800 ml  Net -1512.4 ml   Filed Weights   04/25/22 0453 04/25/22 1255 04/26/22 0337  Weight: 84.3 kg 84.3 kg 85.8 kg    Examination:   Constitutional: NAD, alert, appeared to have cognitive deficits HEENT: conjunctivae and lids normal, EOMI CV: No cyanosis.   RESP: normal respiratory effort, on 2L Neuro: II - XII grossly intact.     Data Reviewed: I have personally reviewed labs and imaging studies  Time spent: 50 minutes  Enzo Bi, MD [facility] If 7PM-7AM, please contact night-coverage 04/26/2022, 4:29 PM

## 2022-04-26 NOTE — Consult Note (Signed)
ANTICOAGULATION CONSULT NOTE   Pharmacy Consult for Heparin infusion Indication: atrial fibrillation  Allergies  Allergen Reactions   Iodinated Contrast Media Shortness Of Breath   Penicillins Anaphylaxis and Shortness Of Breath    Respiratory  Tolerated cefuroxime on 01/06/16   Strawberry Extract Anaphylaxis   Cefepime Itching    Empiric antibiotic, developed pruritis.    Erythromycin Itching   Sulfa Antibiotics Itching, Nausea And Vomiting and Nausea Only    Patient Measurements: Height: 5\' 9"  (175.3 cm) Weight: 85.8 kg (189 lb 2.5 oz) IBW/kg (Calculated) : 70.7 Heparin Dosing Weight: 84.3 kg  Vital Signs: Temp: 97.5 F (36.4 C) (10/20 0359) BP: 99/63 (10/20 0359) Pulse Rate: 60 (10/20 0359)  Labs: Recent Labs    04/24/22 0413 04/24/22 1052 04/24/22 1052 04/24/22 1837 04/25/22 0053 04/25/22 0528 04/26/22 0021 04/26/22 0726  HGB 12.6*  --   --   --   --  12.9* 11.9*  --   HCT 39.3  --   --   --   --  39.5 35.9*  --   PLT 329  --   --   --   --  304 282  --   APTT 78* 122*  --  99*  --   --   --   --   LABPROT  --   --   --   --   --  15.5*  --   --   INR  --   --   --   --   --  1.2  --   --   HEPARINUNFRC  --  0.97*   < > 0.64 0.61  --  0.77* 0.65  CREATININE 1.25*  --   --   --   --  1.15 1.26*  --    < > = values in this interval not displayed.     Estimated Creatinine Clearance: 65.1 mL/min (A) (by C-G formula based on SCr of 1.26 mg/dL (H)).   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 Pharmacy consulted to transition to IV heparin therapy for possible procedures  Date Time aPTT/HL Rate/Comment 10/18 0413 78s / --  Thera x1; 1300 un/hr  10/18   1052 122s/0.97 Supratherapeutic  10/18 1837 99s/0.64 Therapeutic x 1 10/19 0053 0.61  Therapeutic x 2 10/20 0021 0.77  Supratherapeutic 10/20 0726 0.65  Therapeutic x 1   Goal of Therapy:  Heparin level 0.3-0.7 units/ml aPTT 66-102 seconds Monitor platelets by anticoagulation protocol: Yes   Plan:  Continue heparin infusion rate at 1050 units/hr Recheck HL in 6 hours  Continue to monitor H&H and platelets daily while on heparin infusion  Liana Gerold, PharmD 04/26/2022 8:01 AM

## 2022-04-26 NOTE — Consult Note (Deleted)
ANTICOAGULATION CONSULT NOTE   Pharmacy Consult for Heparin infusion Indication: atrial fibrillation  Allergies  Allergen Reactions   Iodinated Contrast Media Shortness Of Breath   Penicillins Anaphylaxis and Shortness Of Breath    Respiratory  Tolerated cefuroxime on 01/06/16   Strawberry Extract Anaphylaxis   Cefepime Itching    Empiric antibiotic, developed pruritis.    Erythromycin Itching   Sulfa Antibiotics Itching, Nausea And Vomiting and Nausea Only    Patient Measurements: Height: 5\' 9"  (175.3 cm) Weight: 85.8 kg (189 lb 2.5 oz) IBW/kg (Calculated) : 70.7 Heparin Dosing Weight: 84.3 kg  Vital Signs: Temp: 97.8 F (36.6 C) (10/20 1222) Temp Source: Oral (10/20 1222) BP: 107/70 (10/20 1222) Pulse Rate: 68 (10/20 1222)  Labs: Recent Labs    04/24/22 0413 04/24/22 1052 04/24/22 1052 04/24/22 1837 04/25/22 0053 04/25/22 0528 04/25/22 1408 04/25/22 1417 04/26/22 0021 04/26/22 0726 04/26/22 1413  HGB 12.6*  --   --   --   --  12.9* 12.2* 12.9* 11.9*  --   --   HCT 39.3  --   --   --   --  39.5 36.0* 38.0* 35.9*  --   --   PLT 329  --   --   --   --  304  --   --  282  --   --   APTT 78* 122*  --  99*  --   --   --   --   --   --   --   LABPROT  --   --   --   --   --  15.5*  --   --   --   --   --   INR  --   --   --   --   --  1.2  --   --   --   --   --   HEPARINUNFRC  --  0.97*   < > 0.64   < >  --   --   --  0.77* 0.65 0.55  CREATININE 1.25*  --   --   --   --  1.15  --   --  1.26*  --   --    < > = values in this interval not displayed.     Estimated Creatinine Clearance: 65.1 mL/min (A) (by C-G formula based on SCr of 1.26 mg/dL (H)).   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 Pharmacy consulted to transition to IV heparin therapy for possible procedures  Date Time aPTT/HL Rate/Comment 10/18 0413 78s / --  Thera x1; 1300 un/hr  10/18   1052  122s/0.97 Supratherapeutic  10/18 1837 99s/0.64 Therapeutic x 1 10/19 0053 0.61  Therapeutic x 2 10/20 0021 0.77  Supratherapeutic 10/20 0726 0.65  Therapeutic x1 10/20 1413 0.55  Therapeutic x2 @1050  un/hr  Goal of Therapy:  Heparin level 0.3-0.7 units/ml aPTT 66-102 seconds Monitor platelets by anticoagulation protocol: Yes   Plan:  HL 0.55; therapeutic consecutively. Continue heparin infusion rate at 1050 units/hr Recheck HL at least daily with AM labs. Continue to monitor H&H and platelets daily while on heparin infusion  Lorna Dibble, PharmD, [facility] Clinical Pharmacist 04/26/2022 3:35 PM

## 2022-04-26 NOTE — Evaluation (Signed)
Physical Therapy Evaluation Patient Details Name: Carl [name] MRN: [id number] DOB: 22-Apr-1961 Today's Date: 04/26/2022  History of Present Illness  Carl [name] is a 56yoM who comes to [facility] on 10/16 from [facility] c SOB. PMH: COPD, CHF, BPD, PAF, seizure DO, CVA. Prior to his arrival to the hospital yesterday, the office was notified of a ventricular fibrillation event requiring ICD shock.  Clinical Impression  Pt in bed on arrival, awake, appears listless, responds to authors questioning in time only, but the content when it is comprehensible, often has nothing to do with what Pryor Curia is asking. Otherwise, much of speech is slurred heavily. Pt's gestures indicates adequate reception of language, particularly when efforts are combined to achieve a task. Pt is remarkably weak, requires maxA to EOB, minA to stand, he tolerates standing and sitting both for less than 30 seconds. Pt appears to feel not very good, but at end of session, able to sit him up in bed, HOB at 40 degrees, appears restful. Maintained on 2L/min throughout, no desaturation despite frequent and significant exacerbation of dysnpea. Pt not at his baseline, likely will need STM prior to return to ALF.      Recommendations for follow up therapy are one component of a multi-disciplinary discharge planning process, led by the attending physician.  Recommendations may be updated based on patient status, additional functional criteria and insurance authorization.  Follow Up Recommendations Skilled nursing-short term rehab (<3 hours/day) Can patient physically be transported by private vehicle: No    Assistance Recommended at Discharge Frequent or constant Supervision/Assistance  Patient can return home with the following  Two people to help with walking and/or transfers;Help with stairs or ramp for entrance;Assistance with cooking/housework;Assist for transportation    Equipment Recommendations None recommended by PT   Recommendations for Other Services       Functional Status Assessment Patient has had a recent decline in their functional status and demonstrates the ability to make significant improvements in function in a reasonable and predictable amount of time.     Precautions / Restrictions Precautions Precautions: Fall Restrictions Weight Bearing Restrictions: No      Mobility  Bed Mobility Overal bed mobility: Needs Assistance Bed Mobility: Supine to Sit, Sit to Supine     Supine to sit: Max assist Sit to supine: Max assist   General bed mobility comments: first attempt to rise results in collapse backwards and severe dyspnea with welevated RR, no desaturation (this was with modA of trunk)    Transfers Overall transfer level: Needs assistance Equipment used: 1 person hand held assist Transfers: Sit to/from Stand Sit to Stand: Min guard           General transfer comment: appears somewhat steady once up; is able to take some slide steps toward left to get to center of bed. veyr shaky    Ambulation/Gait Ambulation/Gait assistance:  (unable)                Stairs            Wheelchair Mobility    Modified Rankin (Stroke Patients Only)       Balance                                             Pertinent Vitals/Pain Pain Assessment Pain Assessment:  (unable to determine)    Home Living Family/patient expects to  be discharged to:: Assisted living                 Home Equipment: Rolling Walker (2 wheels) Additional Comments: Pt unable to describe, information taken from chart.    Prior Function Prior Level of Function : Patient poor historian/Family not available             Mobility Comments: Mostly WC mobility in facility, occasional RW use for AMB       Hand Dominance        Extremity/Trunk Assessment   Upper Extremity Assessment Upper Extremity Assessment: Generalized weakness    Lower Extremity  Assessment Lower Extremity Assessment: Generalized weakness    Cervical / Trunk Assessment Cervical / Trunk Assessment:  (weak)  Communication      Cognition   Behavior During Therapy: Anxious Overall Cognitive Status: Difficult to assess                                 General Comments: responses are difficult to understand, appropriate in timing, but often completely unrelated to questiong; "Are you having any pain?"- "We you know, she's like that sometimes?"; "Are you dizzy right now?"- "I ought to slit her throat."        General Comments      Exercises     Assessment/Plan    PT Assessment Patient needs continued PT services  PT Problem List Decreased strength;Decreased range of motion;Decreased activity tolerance;Decreased balance;Decreased mobility;Decreased knowledge of use of DME;Decreased safety awareness;Decreased knowledge of precautions       PT Treatment Interventions DME instruction;Balance training;Gait training;Neuromuscular re-education;Stair training;Cognitive remediation;Functional mobility training;Patient/family education;Therapeutic activities;Therapeutic exercise    PT Goals (Current goals can be found in the Care Plan section)  Acute Rehab PT Goals PT Goal Formulation: Patient unable to participate in goal setting    Frequency Min 2X/week     Co-evaluation               AM-PAC PT "6 Clicks" Mobility  Outcome Measure Help needed turning from your back to your side while in a flat bed without using bedrails?: A Lot Help needed moving from lying on your back to sitting on the side of a flat bed without using bedrails?: A Lot Help needed moving to and from a bed to a chair (including a wheelchair)?: A Lot Help needed standing up from a chair using your arms (e.g., wheelchair or bedside chair)?: A Lot Help needed to walk in hospital room?: A Lot Help needed climbing 3-5 steps with a railing? : A Lot 6 Click Score: 12    End  of Session Equipment Utilized During Treatment: Oxygen Activity Tolerance: Patient tolerated treatment well;Treatment limited secondary to medical complications (Comment) Patient left: in bed;with call bell/phone within reach;with bed alarm set   PT Visit Diagnosis: Unsteadiness on feet (R26.81);Other abnormalities of gait and mobility (R26.89)    Time: 1355-1410 PT Time Calculation (min) (ACUTE ONLY): 15 min   Charges:   PT Evaluation $PT Eval High Complexity: 1 High         4:13 PM, 04/26/22 Rosamaria Lints, PT, DPT Physical Therapist - [facility]  [phone] (ASCOM)    Devinn Hurwitz C 04/26/2022, 4:01 PM

## 2022-04-26 NOTE — Progress Notes (Signed)
Rounding Note    Patient Name: Carl [name] Date of Encounter: 04/26/2022  [place] Cardiologist: Kathlyn Sacramento, MD   Subjective   Patient seen on AM rounds. Denies any chest pain or shortness of breath. Continues to have amiodarone and heparin infusions going. LHC yesterday without difficulty. -1.2L output in the last 24 hours.  Inpatient Medications    Scheduled Meds:  aspirin EC  81 mg Oral Daily   atorvastatin  40 mg Oral Daily   divalproex  500 mg Oral BID   escitalopram  10 mg Oral QHS   ezetimibe  10 mg Oral Daily   feeding supplement  237 mL Oral BID BM   furosemide  40 mg Intravenous BID   hydrOXYzine  10 mg Oral BID   levETIRAcetam  250 mg Oral BID   levothyroxine  37.5 mcg Oral Q0600   losartan  12.5 mg Oral Daily   metoprolol succinate  12.5 mg Oral QHS   multivitamin with minerals  1 tablet Oral Daily   pantoprazole  40 mg Oral Daily   sodium chloride flush  3 mL Intravenous Q12H   sodium chloride flush  3 mL Intravenous Q12H   tiotropium  18 mcg Inhalation Daily   Continuous Infusions:  sodium chloride 250 mL (04/25/22 1900)   amiodarone 30 mg/hr (04/26/22 0624)   heparin 1,050 Units/hr (04/26/22 0109)   PRN Meds: sodium chloride, acetaminophen **OR** acetaminophen, albuterol, magnesium hydroxide, ondansetron **OR** ondansetron (ZOFRAN) IV, sodium chloride flush, traMADol, traZODone   Vital Signs    Vitals:   04/25/22 2019 04/25/22 2350 04/26/22 0337 04/26/22 0359  BP: 103/62 98/60  99/63  Pulse: 60 65  60  Resp: 18 18  18   Temp: 97.6 F (36.4 C) (!) 97.5 F (36.4 C)  (!) 97.5 F (36.4 C)  TempSrc:      SpO2: 98% 99%  98%  Weight:   85.8 kg   Height:        Intake/Output Summary (Last 24 hours) at 04/26/2022 0802 Last data filed at 04/26/2022 0400 Gross per 24 hour  Intake 754.12 ml  Output 2000 ml  Net -1245.88 ml      04/26/2022    3:37 AM 04/25/2022   12:55 PM 04/25/2022    4:53 AM  Last 3 Weights  Weight (lbs)  189 lb 2.5 oz 185 lb 13.6 oz 185 lb 13.6 oz  Weight (kg) 85.8 kg 84.3 kg 84.3 kg      Telemetry    V paced a sensed rate in the 60s- Personally Reviewed  ECG    No new tracings- Personally Reviewed  Physical Exam   GEN: No acute distress.   Neck: JVD appreciated Cardiac: RRR, no murmurs, rubs, or gallops.  Respiratory: Diminished to auscultation bilaterally.  Respirations are unlabored at rest on 2 L via nasal cannula GI: Soft, nontender, non-distended  MS: Trace pretibial edema; No deformity. Neuro:  Nonfocal  Psych: Normal affect   Labs    High Sensitivity Troponin:   Recent Labs  Lab 04/21/22 2357 04/22/22 0218  TROPONINIHS 168* 151*     Chemistry Recent Labs  Lab 04/21/22 2357 04/22/22 0557 04/24/22 0413 04/25/22 0528 04/26/22 0021  NA 136   < > 141 135 138  K 2.7*   < > 3.8 4.5 3.9  CL 99   < > 100 101 101  CO2 27   < > 32 28 28  GLUCOSE 153*   < > 110* 165* 181*  BUN 11   < > 28* 29* 34*  CREATININE 1.20   < > 1.25* 1.15 1.26*  CALCIUM 9.2   < > 9.4 9.1 9.2  MG  --    < > 1.9 1.9 2.1  PROT 7.3  --   --   --   --   ALBUMIN 3.0*  --   --   --   --   AST 27  --   --   --   --   ALT 13  --   --   --   --   ALKPHOS 53  --   --   --   --   BILITOT 0.7  --   --   --   --   GFRNONAA >60   < > >60 >60 >60  ANIONGAP 10   < > 9 6 9    < > = values in this interval not displayed.    Lipids No results for input(s): "CHOL", "TRIG", "HDL", "LABVLDL", "LDLCALC", "CHOLHDL" in the last 168 hours.  Hematology Recent Labs  Lab 04/24/22 0413 04/25/22 0528 04/26/22 0021  WBC 8.1 6.2 11.2*  RBC 4.34 4.52 4.08*  HGB 12.6* 12.9* 11.9*  HCT 39.3 39.5 35.9*  MCV 90.6 87.4 88.0  MCH 29.0 28.5 29.2  MCHC 32.1 32.7 33.1  RDW 17.8* 17.2* 17.3*  PLT 329 304 282   Thyroid  Recent Labs  Lab 04/22/22 0557  TSH 4.961*    BNP Recent Labs  Lab 04/21/22 2356  BNP 878.6*    DDimer  Recent Labs  Lab 04/22/22 0218  DDIMER 1.30*     Radiology      Cardiac  Studies  [facility] 04/25/2022 Conclusions: Moderate-severe multivessel coronary artery disease, including 50% proximal LAD stenosis (mid/distal LAD are small with prominent first septal branch consistent with "double-barrel LAD), occlusion of large proximal D1 (distal vessel is supplied by patent LIMA graft), moderate diffuse LCx disease, occluded OM2 branch supplied by ectatic but patent SVG, and sequential proximal through distal RCA disease of up to 80-90% that is not hemodynamically significant (iFR = 0.99). Widely patent LIMA-D1. Ectatic but patent SVG-OM2. Patent overlapping proximal through distal RCA stents with mild in-stent restenosis in the proximal segment. Moderately elevated left heart filling pressures (LVEDP 25-30 mmHg, PWCP 30 mmHg with prominent V-waves). Moderately-severely elevated right heart and pulmonary artery pressures (mean RA 15 mmHg, mean PAP 42 mmHg). Low normal to mildly reduced Fick cardiac output/index (Fick CO/CI, 4.9 L/min, 2.4 L/min/m^2).   Recommendations: Continue medical therapy; no target for PCI.  I suspect elevated troponin represents supply-demand mismatch in the setting of chronic ischemic heart disease, acute on chronic HFrEF, and recent VT/VF with ICD shock. Aggressive diuresis as blood pressure and renal function allow. Escalate goal-directed medical therapy for HFrEF due to ischemic cardiomyopathy as blood pressure and renal function allow. Aggressive secondary prevention of coronary artery disease. Restart IV heparin 2 hours after TR band removal; transition back to apixaban as soon as tomorrow if there is no evidence of bleeding or vascular injury from catheterization.  TTE 02/16/2022 1. Left ventricular ejection fraction, by estimation, is 25 to 30%. The  left ventricle has severely decreased function. The left ventricle  demonstrates regional wall motion abnormalities (see scoring  diagram/findings for description). The left  ventricular internal  cavity size was moderately dilated. There is mild  concentric left ventricular hypertrophy. Left ventricular diastolic  parameters are indeterminate. Elevated left ventricular end-diastolic  pressure.   2.  Right ventricular systolic function is normal. The right ventricular  size is normal.   3. Left atrial size was severely dilated.   4. Right atrial size was mildly dilated.   5. The mitral valve is normal in structure. Mild mitral valve  regurgitation. No evidence of mitral stenosis.   6. The aortic valve is tricuspid. Aortic valve regurgitation is mild. No  aortic stenosis is present.   7. The inferior vena cava is normal in size with <50% respiratory  variability, suggesting right atrial pressure of 8 mmHg.  Patient Profile     61 y.o. male with a history of CAD/CABG, ischemic cardiomyopathy s/p ICD, COPD, bipolar disorder, who presented with shortness of breath, leg edema, and ICD shock secondary to V.tach/V-fib, who is being seen and evaluated for HFrEF and VT.  Assessment & Plan    Ischemic cardiomyopathy with EF 25-30% -Multiple admissions in the last few months presented with worsening shortness of breath -Furosemide was recently increased to 60 mg daily -Continued on losartan and Toprol-XL -BNP of 878 --1.2 L output in the last 24 hours -Continued on furosemide 40 mg IV twice daily -Continue Toprol-XL, losartan, Lasix -Daily BMP while receiving diuretic therapy -Weight, I&O, low-sodium diet  V. tach/V-fib status post ICD shock -Interrogation of device was done in the emergency department -EP consultation completed with recommendation to continue amiodarone drip for 48 hours and then transition over to oral amiodarone 400 mg daily at that point time he would need follow-up in 6 to 8 weeks for blood work of CMP TSH and a free T4 -Recommended to keep potassium level above 4 and magnesium 2 or better  CAD status post CABG -Remains chest pain-free -High-sensitivity  troponin peaked at 168 -Continue aspirin, atorvastatin, Zetia, losartan, Toprol-XL -Underwent left heart catheterization and right heart catheterization on 04/25/2022 which revealed moderate to severe multivessel coronary artery disease, widely patent LIMA-D1, atretic but patent SVG-OM 2, patent overlapping proximal 3 distal RCA stents with mild in-stent restenosis in the proximal segment, moderately elevated left heart filling pressure 25-30 mmHg, PWCP 30 mmHg, moderately to severely elevated right heart and pulmonary artery pressures mean RA 15 mmHg, mean PAP 42 mmHg, low normal to mildly reduced Fick cardiac output mentation was continue with medical therapy, aggressive diuresis his blood pressure and renal function allow and to escalate GDMT  Paroxysmal atrial fibrillation -Remains paced on telemetry monitoring -Transition off of heparin back to apixaban -Continue aspirin metoprolol -Continue cardiac monitoring     For questions or updates, please contact [facility] Please consult [email or web address] for contact info under        Signed, Marlette Curvin, NP  04/26/2022, 8:02 AM

## 2022-04-26 NOTE — Consult Note (Signed)
ANTICOAGULATION CONSULT NOTE   Pharmacy Consult for Heparin infusion Indication: atrial fibrillation  Allergies  Allergen Reactions   Iodinated Contrast Media Shortness Of Breath   Penicillins Anaphylaxis and Shortness Of Breath    Respiratory  Tolerated cefuroxime on 01/06/16   Strawberry Extract Anaphylaxis   Cefepime Itching    Empiric antibiotic, developed pruritis.    Erythromycin Itching   Sulfa Antibiotics Itching, Nausea And Vomiting and Nausea Only    Patient Measurements: Height: 5\' 9"  (175.3 cm) Weight: 84.3 kg (185 lb 13.6 oz) IBW/kg (Calculated) : 70.7 Heparin Dosing Weight: 86.5 kg  Vital Signs: Temp: 97.5 F (36.4 C) (10/19 2350) Temp Source: Oral (10/19 1745) BP: 98/60 (10/19 2350) Pulse Rate: 65 (10/19 2350)  Labs: Recent Labs    04/24/22 0413 04/24/22 1052 04/24/22 1052 04/24/22 1837 04/25/22 0053 04/25/22 0528 04/26/22 0021  HGB 12.6*  --   --   --   --  12.9* 11.9*  HCT 39.3  --   --   --   --  39.5 35.9*  PLT 329  --   --   --   --  304 282  APTT 78* 122*  --  99*  --   --   --   LABPROT  --   --   --   --   --  15.5*  --   INR  --   --   --   --   --  1.2  --   HEPARINUNFRC  --  0.97*   < > 0.64 0.61  --  0.77*  CREATININE 1.25*  --   --   --   --  1.15 1.26*   < > = values in this interval not displayed.     Estimated Creatinine Clearance: 60 mL/min (A) (by C-G formula based on SCr of 1.26 mg/dL (H)).   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 Pharmacy consulted to transition to IV heparin therapy for possible procedures  Date Time aPTT/HL Rate/Comment 10/18 0413 78s / --  Thera x1; 1300 un/hr  10/18   1052 122s/0.97 Supratherapeutic  10/18 1837 99s/0.64 Therapeutic x 1 10/19 0053 0.61  Therapeutic x 2 10/20 0021 0.77  Supratherapeutic   Goal of Therapy:  Heparin level 0.3-0.7 units/ml aPTT 66-102 seconds Monitor platelets by anticoagulation protocol: Yes   Plan:  Decrease heparin infusion rate to 1050 units/hr Recheck HL in 6 hours after rate change Continue to monitor H&H and platelets daily while on heparin infusion  Renda Rolls, PharmD, [facility] 04/26/2022 12:58 AM

## 2022-04-27 DIAGNOSIS — J9601 Acute respiratory failure with hypoxia: Secondary | ICD-10-CM | POA: Diagnosis not present

## 2022-04-27 DIAGNOSIS — Z4502 Encounter for adjustment and management of automatic implantable cardiac defibrillator: Secondary | ICD-10-CM | POA: Diagnosis not present

## 2022-04-27 DIAGNOSIS — I472 Ventricular tachycardia, unspecified: Secondary | ICD-10-CM | POA: Diagnosis not present

## 2022-04-27 DIAGNOSIS — I5023 Acute on chronic systolic (congestive) heart failure: Secondary | ICD-10-CM | POA: Diagnosis not present

## 2022-04-27 LAB — LIPOPROTEIN A (LPA): Lipoprotein (a): 26.6 nmol/L (ref <75.0)

## 2022-04-27 LAB — BASIC METABOLIC PANEL
Anion gap: 9 (ref 5–15)
BUN: 37 mg/dL — ABNORMAL HIGH (ref 8–23)
CO2: 31 mmol/L (ref 22–32)
Calcium: 8.7 mg/dL — ABNORMAL LOW (ref 8.9–10.3)
Chloride: 98 mmol/L (ref 98–111)
Creatinine, Ser: 1.29 mg/dL — ABNORMAL HIGH (ref 0.61–1.24)
GFR, Estimated: >60 mL/min (ref >60)
Glucose, Bld: 103 mg/dL — ABNORMAL HIGH (ref 70–99)
Potassium: 3.7 mmol/L (ref 3.5–5.1)
Sodium: 138 mmol/L (ref 135–145)

## 2022-04-27 LAB — MAGNESIUM: Magnesium: 2.2 mg/dL (ref 1.7–2.4)

## 2022-04-27 LAB — CBC
HCT: 35.8 % — ABNORMAL LOW (ref 39.0–52.0)
Hemoglobin: 11.7 g/dL — ABNORMAL LOW (ref 13.0–17.0)
MCH: 28.4 pg (ref 26.0–34.0)
MCHC: 32.7 g/dL (ref 30.0–36.0)
MCV: 86.9 fL (ref 80.0–100.0)
Platelets: 278 10*3/uL (ref 150–400)
RBC: 4.12 MIL/uL — ABNORMAL LOW (ref 4.22–5.81)
RDW: 17.5 % — ABNORMAL HIGH (ref 11.5–15.5)
WBC: 12.9 10*3/uL — ABNORMAL HIGH (ref 4.0–10.5)
nRBC: 0.5 % — ABNORMAL HIGH (ref 0.0–0.2)

## 2022-04-27 NOTE — Progress Notes (Signed)
  PROGRESS NOTE    Carl [name]  VOH:[id number] DOB: 1960/12/09 DOA: 04/22/2022 PCP: [facility]  [id number]  LOS: 5 days   Brief hospital course:   Assessment & Plan: Carl [name] is a 61 y.o. Caucasian male with medical history significant for bipolar 1 disorder, systolic CHF, COPD, depression, hypertension, coronary artery disease, seizure disorder and CVA, who presented to the emergency room with acute onset of worsening dyspnea at rest with orthopnea and lower extremity edema.     * Acute respiratory failure with hypoxia (HCC) --In the ED, respiratory rate of 31 and pulse oximetry was 84% on room air, 96% on 3 L of O2 by nasal cannula. --2/2 CHF exacerbation.  CXR showed Cardiomegaly with mild perihilar edema.   --Continue supplemental O2 to keep sats >=90%, wean as tolerated --cont diuresis, per cardio   VT/VF status post ICD firing --A note from 10/16 documented Device alert for VF with successful therapy.  Tele showed pt having frequent short stretches of VTach.  Digoxin has been discontinued and he is now on amiodarone. --cardiology consulted  --cont oral amiodarone --cont Toprol   Acute on chronic HFrEF (heart failure with reduced ejection fraction) (HCC) --LVEF 25-30%, with ICD in place.  CXR showed Cardiomegaly with mild perihilar edema.   --right and left heart cath showed mod-severe multivessel CAD, mod-severe pulm artery pressures Plan: --cont IV lasix 40 BID, per cardio --cont Toprol, losartan --Blood pressure too low for Entresto at this time.   --Recommend Jardiance or Farxiga at discharge.   Hx of CAD s/p CABG --right and left heart cath showed mod-severe multivessel CAD, patent grafts overall --cont ASA and statin  Hypokalemia --monitor and replete PRN   Hypothyroidism -Continue Synthroid   Bipolar 1 disorder ([place]) - continue Seroquel and Lexapro.   Paroxysmal atrial fibrillation (HCC) - continue Eliquis  --cont amio and Toprol    History of seizure -continue Depakote ER and Keppra.   Dyslipidemia -continue statin therapy.  Cognitive deficit --has DSS legal guardian    DVT prophylaxis: IR:SWNIOEV Code Status: Full code  Family Communication:  Level of care: Progressive Dispo:   The patient is from: [facility] Anticipated d/c is to: SNF Anticipated d/c date is: [facility] bed available   Subjective and Interval History:  No acute events.  Pt reported doing fine.   Objective: Vitals:   04/27/22 0436 04/27/22 0804 04/27/22 1223 04/27/22 1720  BP: 111/61 101/60 107/67 98/64  Pulse: 62 81 63 64  Resp: 20 18 18 17   Temp: 98.3 F (36.8 C) 98.3 F (36.8 C) 98.1 F (36.7 C)   TempSrc:      SpO2: 93% 98% 98% 100%  Weight:      Height:        Intake/Output Summary (Last 24 hours) at 04/27/2022 1900 Last data filed at 04/27/2022 0800 Gross per 24 hour  Intake 240 ml  Output 800 ml  Net -560 ml   Filed Weights   04/25/22 1255 04/26/22 0337 04/27/22 0431  Weight: 84.3 kg 85.8 kg 84.4 kg    Examination:   Constitutional: NAD, alert HEENT: conjunctivae and lids normal, EOMI CV: No cyanosis.   RESP: normal respiratory effort Neuro: II - XII grossly intact.     Data Reviewed: I have personally reviewed labs and imaging studies  Time spent: 35 minutes  Enzo Bi, MD [facility] If 7PM-7AM, please contact night-coverage 04/27/2022, 7:00 PM

## 2022-04-27 NOTE — Telephone Encounter (Signed)
Can we look at these tracings on Monday please  Thanks SK

## 2022-04-27 NOTE — Progress Notes (Addendum)
Rounding Note    Patient Name: Carl [name] Date of Encounter: 04/27/2022  [facility] Cardiologist: Kathlyn Sacramento, MD   Subjective   Patient seen on a.m. rounds.  Denies any chest pain or shortness of breath.  Transitioned off amiodarone drip onto oral amiodarone and IV heparin has been stopped and he is currently back on apixaban.  -1.3 L output in the last 24 hours  Inpatient Medications    Scheduled Meds:  amiodarone  400 mg Oral Daily   apixaban  5 mg Oral BID   aspirin EC  81 mg Oral Daily   atorvastatin  40 mg Oral Daily   divalproex  500 mg Oral BID   escitalopram  10 mg Oral QHS   ezetimibe  10 mg Oral Daily   feeding supplement  237 mL Oral BID BM   furosemide  40 mg Intravenous BID   hydrOXYzine  10 mg Oral BID   levETIRAcetam  250 mg Oral BID   levothyroxine  37.5 mcg Oral Q0600   losartan  12.5 mg Oral Daily   metoprolol succinate  12.5 mg Oral QHS   multivitamin with minerals  1 tablet Oral Daily   pantoprazole  40 mg Oral Daily   sodium chloride flush  3 mL Intravenous Q12H   sodium chloride flush  3 mL Intravenous Q12H   tiotropium  18 mcg Inhalation Daily   Continuous Infusions:  sodium chloride Stopped (04/26/22 1546)   PRN Meds: sodium chloride, acetaminophen **OR** acetaminophen, albuterol, magnesium hydroxide, ondansetron **OR** ondansetron (ZOFRAN) IV, sodium chloride flush, traMADol, traZODone   Vital Signs    Vitals:   04/26/22 2316 04/27/22 0431 04/27/22 0436 04/27/22 0804  BP: 114/60  111/61 101/60  Pulse: 72  62 81  Resp: 18  20 18   Temp: 98.3 F (36.8 C)  98.3 F (36.8 C) 98.3 F (36.8 C)  TempSrc:      SpO2: 98%  93% 98%  Weight:  84.4 kg    Height:        Intake/Output Summary (Last 24 hours) at 04/27/2022 0955 Last data filed at 04/27/2022 2778 Gross per 24 hour  Intake 986.95 ml  Output 2325 ml  Net -1338.05 ml      04/27/2022    4:31 AM 04/26/2022    3:37 AM 04/25/2022   12:55 PM  Last 3 Weights   Weight (lbs) 186 lb 1.1 oz 189 lb 2.5 oz 185 lb 13.6 oz  Weight (kg) 84.4 kg 85.8 kg 84.3 kg      Telemetry    A sensed V paced- Personally Reviewed  ECG    No new tracings- Personally Reviewed  Physical Exam   GEN: No acute distress.   Neck: No JVD Cardiac: RRR, no murmurs, rubs, or gallops.  Left wrist and right brachial cath sites with gauze and OpSite dressings that are clean dry and intact without bleeding or hematoma.  2+ radial pulses bilaterally. Respiratory: Clear to upper lobes with slightly diminished bases to auscultation bilaterally.  Rations remain unlabored at rest on 2 L of O2 via nasal cannula GI: Soft, nontender, non-distended  MS: Trace pretibial edema; No deformity. Neuro:  Nonfocal  Psych: Normal affect   Labs    High Sensitivity Troponin:   Recent Labs  Lab 04/21/22 2357 04/22/22 0218  TROPONINIHS 168* 151*     Chemistry Recent Labs  Lab 04/21/22 2357 04/22/22 0557 04/25/22 0528 04/25/22 1408 04/25/22 1417 04/26/22 0021 04/27/22 0543  NA 136   < >  135   < > 134* 138 138  K 2.7*   < > 4.5   < > 4.3 3.9 3.7  CL 99   < > 101  --   --  101 98  CO2 27   < > 28  --   --  28 31  GLUCOSE 153*   < > 165*  --   --  181* 103*  BUN 11   < > 29*  --   --  34* 37*  CREATININE 1.20   < > 1.15  --   --  1.26* 1.29*  CALCIUM 9.2   < > 9.1  --   --  9.2 8.7*  MG  --    < > 1.9  --   --  2.1 2.2  PROT 7.3  --   --   --   --   --   --   ALBUMIN 3.0*  --   --   --   --   --   --   AST 27  --   --   --   --   --   --   ALT 13  --   --   --   --   --   --   ALKPHOS 53  --   --   --   --   --   --   BILITOT 0.7  --   --   --   --   --   --   GFRNONAA >60   < > >60  --   --  >60 >60  ANIONGAP 10   < > 6  --   --  9 9   < > = values in this interval not displayed.    Lipids No results for input(s): "CHOL", "TRIG", "HDL", "LABVLDL", "LDLCALC", "CHOLHDL" in the last 168 hours.  Hematology Recent Labs  Lab 04/25/22 0528 04/25/22 1408 04/25/22 1417  04/26/22 0021 04/27/22 0543  WBC 6.2  --   --  11.2* 12.9*  RBC 4.52  --   --  4.08* 4.12*  HGB 12.9*   < > 12.9* 11.9* 11.7*  HCT 39.5   < > 38.0* 35.9* 35.8*  MCV 87.4  --   --  88.0 86.9  MCH 28.5  --   --  29.2 28.4  MCHC 32.7  --   --  33.1 32.7  RDW 17.2*  --   --  17.3* 17.5*  PLT 304  --   --  282 278   < > = values in this interval not displayed.   Thyroid  Recent Labs  Lab 04/22/22 0557  TSH 4.961*    BNP Recent Labs  Lab 04/21/22 2356  BNP 878.6*    DDimer  Recent Labs  Lab 04/22/22 0218  DDIMER 1.30*     Radiology      Cardiac Studies   [facility] 04/25/2022 Conclusions: Moderate-severe multivessel coronary artery disease, including 50% proximal LAD stenosis (mid/distal LAD are small with prominent first septal branch consistent with "double-barrel LAD), occlusion of large proximal D1 (distal vessel is supplied by patent LIMA graft), moderate diffuse LCx disease, occluded OM2 branch supplied by ectatic but patent SVG, and sequential proximal through distal RCA disease of up to 80-90% that is not hemodynamically significant (iFR = 0.99). Widely patent LIMA-D1. Ectatic but patent SVG-OM2. Patent overlapping proximal through distal RCA stents with mild in-stent restenosis in the proximal segment. Moderately elevated left  heart filling pressures (LVEDP 25-30 mmHg, PWCP 30 mmHg with prominent V-waves). Moderately-severely elevated right heart and pulmonary artery pressures (mean RA 15 mmHg, mean PAP 42 mmHg). Low normal to mildly reduced Fick cardiac output/index (Fick CO/CI, 4.9 L/min, 2.4 L/min/m^2).   Recommendations: Continue medical therapy; no target for PCI.  I suspect elevated troponin represents supply-demand mismatch in the setting of chronic ischemic heart disease, acute on chronic HFrEF, and recent VT/VF with ICD shock. Aggressive diuresis as blood pressure and renal function allow. Escalate goal-directed medical therapy for HFrEF due to ischemic  cardiomyopathy as blood pressure and renal function allow. Aggressive secondary prevention of coronary artery disease. Restart IV heparin 2 hours after TR band removal; transition back to apixaban as soon as tomorrow if there is no evidence of bleeding or vascular injury from catheterization.   TTE 02/16/2022 1. Left ventricular ejection fraction, by estimation, is 25 to 30%. The  left ventricle has severely decreased function. The left ventricle  demonstrates regional wall motion abnormalities (see scoring  diagram/findings for description). The left  ventricular internal cavity size was moderately dilated. There is mild  concentric left ventricular hypertrophy. Left ventricular diastolic  parameters are indeterminate. Elevated left ventricular end-diastolic  pressure.   2. Right ventricular systolic function is normal. The right ventricular  size is normal.   3. Left atrial size was severely dilated.   4. Right atrial size was mildly dilated.   5. The mitral valve is normal in structure. Mild mitral valve  regurgitation. No evidence of mitral stenosis.   6. The aortic valve is tricuspid. Aortic valve regurgitation is mild. No  aortic stenosis is present.   7. The inferior vena cava is normal in size with <50% respiratory  variability, suggesting right atrial pressure of 8 mmHg.  Patient Profile     61 y.o. male with a history of CAD status CABG, ischemic cardiomyopathy status post ICD, COPD, bipolar disorder, who presented for shortness of breath leg edema and ICD shock secondary to V. tach/V-fib, who has been seen and evaluated for HFrEF and VT.  Assessment & Plan    Ischemic cardiomyopathy with EF 25-30% -Multiple admissions in the last few months presenting with worsening shortness of breath -Furosemide been recently increased to 60 mg daily -Been continued on losartan and Toprol-XL -BNP of 878 --1.3 L output in the last 24 hours -Currently continued on furosemide 40 mg twice  daily as renal function tolerates -Heart catheterization revealed elevated wedge pressure of 25 to 30 mmHg -Daily BMP while receiving IV diuretic therapy -Continued on Toprol-XL and losartan, escalate GDMT as tolerated, blood pressure as remained soft, can consider changing losartan to Entresto if blood pressure allows -Daily weight, I&O, low-sodium diet  V. tach/V-fib status post ICD shock -Interrogation of device was done while he was in the emergency department -EP consultation completed and recommended amiodarone -Continue amiodarone 400 mg daily -We will need follow-up blood work in 6 to 8 weeks of CMP, TSH, and free T4 -Recommend to keep electrolytes with potassium above 4 and magnesium 2 are above -Daily BMP  Coronary artery disease status post CABG -Continues to remain chest pain-free -High-sensitivity troponin peaked at 168 -He has continued on aspirin, atorvastatin, Zetia, losartan, Toprol-XL -Underwent left heart catheterization and right heart catheterization on 04/25/2022 with no targets for PCI recommended to continue with medical therapy aggressive diuresis and to escalate GDMT as blood pressure and kidney function allows  Paroxysmal atrial fibrillation -Remains A sensed V paced on telemetry monitor -  Continue apixaban 5 mg twice daily -CHA2DS2-VASc score of at least 4 -Continue aspirin and metoprolol -Sinew cardiac monitoring while inpatient     For questions or updates, please contact [facility] Please consult [email or web address] for contact info under        Signed, Kirtan Sada, NP  04/27/2022, 9:55 AM

## 2022-04-28 DIAGNOSIS — I5023 Acute on chronic systolic (congestive) heart failure: Secondary | ICD-10-CM | POA: Diagnosis not present

## 2022-04-28 DIAGNOSIS — Z4502 Encounter for adjustment and management of automatic implantable cardiac defibrillator: Secondary | ICD-10-CM | POA: Diagnosis not present

## 2022-04-28 DIAGNOSIS — J9601 Acute respiratory failure with hypoxia: Secondary | ICD-10-CM | POA: Diagnosis not present

## 2022-04-28 LAB — BASIC METABOLIC PANEL
Anion gap: 7 (ref 5–15)
BUN: 31 mg/dL — ABNORMAL HIGH (ref 8–23)
CO2: 31 mmol/L (ref 22–32)
Calcium: 8.7 mg/dL — ABNORMAL LOW (ref 8.9–10.3)
Chloride: 100 mmol/L (ref 98–111)
Creatinine, Ser: 1.15 mg/dL (ref 0.61–1.24)
GFR, Estimated: >60 mL/min (ref >60)
Glucose, Bld: 99 mg/dL (ref 70–99)
Potassium: 3.8 mmol/L (ref 3.5–5.1)
Sodium: 138 mmol/L (ref 135–145)

## 2022-04-28 LAB — CBC
HCT: 35.3 % — ABNORMAL LOW (ref 39.0–52.0)
Hemoglobin: 11.4 g/dL — ABNORMAL LOW (ref 13.0–17.0)
MCH: 28.3 pg (ref 26.0–34.0)
MCHC: 32.3 g/dL (ref 30.0–36.0)
MCV: 87.6 fL (ref 80.0–100.0)
Platelets: 239 10*3/uL (ref 150–400)
RBC: 4.03 MIL/uL — ABNORMAL LOW (ref 4.22–5.81)
RDW: 17.3 % — ABNORMAL HIGH (ref 11.5–15.5)
WBC: 10.7 10*3/uL — ABNORMAL HIGH (ref 4.0–10.5)
nRBC: 0.3 % — ABNORMAL HIGH (ref 0.0–0.2)

## 2022-04-28 LAB — MAGNESIUM: Magnesium: 2.2 mg/dL (ref 1.7–2.4)

## 2022-04-28 MED ORDER — FUROSEMIDE 40 MG PO TABS
60.0000 mg | ORAL_TABLET | Freq: Every day | ORAL | Status: DC
  Administered 2022-04-29 – 2022-04-30 (×2): 60 mg via ORAL
  Filled 2022-04-28 (×2): qty 1

## 2022-04-28 NOTE — TOC Progression Note (Signed)
Transition of Care [facility]) - Progression Note    Patient Details  Name: Carl [name] MRN: [id number] Date of Birth: May 24, 1961  [facility] CM/SW Contact  Zigmund Daniel Dorian Pod, RN Phone Number:[phone] 04/28/2022, 12:29 PM  Clinical Narrative:    [facility] RN spoke with legal guardian Duwaine Maxin Beckey Downing [phone]) concerning discharge plan for HHPT/SNF. Inquired if [name] would be able to accommodate pt for HHPT as a option for ongoing therapy. DSS guardian will involve her social worker [name]) to make these arrangements and follow up with [facility] RN on Monday Mariea Clonts).  TOC remains available for any ongoing needs.   Expected Discharge Plan: Assisted Living Barriers to Discharge: Continued Medical Work up  Expected Discharge Plan and Services Expected Discharge Plan: Assisted Living     Post Acute Care Choice: NA Living arrangements for the past 2 months: Assisted Living Facility                                       Social Determinants of Health (SDOH) Interventions    Readmission Risk Interventions    03/15/2022    2:44 PM  Readmission Risk Prevention Plan  HRI or [place] Complete  Social Work Consult for [place] Planning/Counseling Complete  Palliative Care Screening Not Applicable  Medication Review [employer] photographer) Complete

## 2022-04-28 NOTE — Progress Notes (Signed)
Rounding Note    Patient Name: Carl [name] Date of Encounter: 04/28/2022  [facility] Cardiologist: Lorine Bears, MD   Subjective   Feeling well.  Breathing improving.  No CP/SOB.   Inpatient Medications    Scheduled Meds:  amiodarone  400 mg Oral Daily   apixaban  5 mg Oral BID   aspirin EC  81 mg Oral Daily   atorvastatin  40 mg Oral Daily   divalproex  500 mg Oral BID   escitalopram  10 mg Oral QHS   ezetimibe  10 mg Oral Daily   feeding supplement  237 mL Oral BID BM   furosemide  40 mg Intravenous BID   hydrOXYzine  10 mg Oral BID   levETIRAcetam  250 mg Oral BID   levothyroxine  37.5 mcg Oral Q0600   losartan  12.5 mg Oral Daily   metoprolol succinate  12.5 mg Oral QHS   multivitamin with minerals  1 tablet Oral Daily   pantoprazole  40 mg Oral Daily   sodium chloride flush  3 mL Intravenous Q12H   sodium chloride flush  3 mL Intravenous Q12H   tiotropium  18 mcg Inhalation Daily   Continuous Infusions:  sodium chloride Stopped (04/26/22 1546)   PRN Meds: sodium chloride, acetaminophen **OR** acetaminophen, albuterol, magnesium hydroxide, ondansetron **OR** ondansetron (ZOFRAN) IV, sodium chloride flush, traMADol, traZODone   Vital Signs    Vitals:   04/28/22 0133 04/28/22 0614 04/28/22 0618 04/28/22 0816  BP: 106/64  102/69 (!) 118/59  Pulse: 61  62 62  Resp: 18   18  Temp: 97.7 F (36.5 C)  97.6 F (36.4 C) 97.9 F (36.6 C)  TempSrc: Oral  Oral Oral  SpO2: 97%  92% 95%  Weight:  83.5 kg    Height:        Intake/Output Summary (Last 24 hours) at 04/28/2022 1129 Last data filed at 04/28/2022 1117 Gross per 24 hour  Intake --  Output 1750 ml  Net -1750 ml      04/28/2022    6:14 AM 04/27/2022    4:31 AM 04/26/2022    3:37 AM  Last 3 Weights  Weight (lbs) 184 lb 1.4 oz 186 lb 1.1 oz 189 lb 2.5 oz  Weight (kg) 83.5 kg 84.4 kg 85.8 kg      Telemetry    APVP - Personally Reviewed  ECG    N/a - Personally  Reviewed  Physical Exam   VS:  BP (!) 118/59 (BP Location: Left Arm)   Pulse 62   Temp 97.9 F (36.6 C) (Oral)   Resp 18   Ht 5\' 9"  (1.753 m)   Wt 83.5 kg   SpO2 95%   BMI 27.18 kg/m  , BMI Body mass index is 27.18 kg/m. GENERAL:  Well appearing.  No acute distress HEENT: Pupils equal round and reactive, fundi not visualized, oral mucosa unremarkable NECK:  No jugular venous distention, waveform within normal limits, carotid upstroke brisk and symmetric, no bruits, no thyromegaly LUNGS: Mild expiratory wheeze HEART:  RRR.  PMI not displaced or sustained,S1 and S2 within normal limits, no S3, no S4, no clicks, no rubs, no murmurs ABD:  Flat, positive bowel sounds normal in frequency in pitch, no bruits, no rebound, no guarding, no midline pulsatile mass, no hepatomegaly, no splenomegaly EXT:  2 plus pulses throughout, no edema, no cyanosis no clubbing SKIN:  No rashes no nodules NEURO:  Cranial nerves II through XII grossly intact, motor  grossly intact throughout [facility]:  Cognitively intact, oriented to person place and time   Labs    High Sensitivity Troponin:   Recent Labs  Lab 04/21/22 2357 04/22/22 0218  TROPONINIHS 168* 151*     Chemistry Recent Labs  Lab 04/21/22 2357 04/22/22 0557 04/26/22 0021 04/27/22 0543 04/28/22 0611  NA 136   < > 138 138 138  K 2.7*   < > 3.9 3.7 3.8  CL 99   < > 101 98 100  CO2 27   < > 28 31 31   GLUCOSE 153*   < > 181* 103* 99  BUN 11   < > 34* 37* 31*  CREATININE 1.20   < > 1.26* 1.29* 1.15  CALCIUM 9.2   < > 9.2 8.7* 8.7*  MG  --    < > 2.1 2.2 2.2  PROT 7.3  --   --   --   --   ALBUMIN 3.0*  --   --   --   --   AST 27  --   --   --   --   ALT 13  --   --   --   --   ALKPHOS 53  --   --   --   --   BILITOT 0.7  --   --   --   --   GFRNONAA >60   < > >60 >60 >60  ANIONGAP 10   < > 9 9 7    < > = values in this interval not displayed.    Lipids No results for input(s): "CHOL", "TRIG", "HDL", "LABVLDL", "LDLCALC", "CHOLHDL" in  the last 168 hours.  Hematology Recent Labs  Lab 04/26/22 0021 04/27/22 0543 04/28/22 0611  WBC 11.2* 12.9* 10.7*  RBC 4.08* 4.12* 4.03*  HGB 11.9* 11.7* 11.4*  HCT 35.9* 35.8* 35.3*  MCV 88.0 86.9 87.6  MCH 29.2 28.4 28.3  MCHC 33.1 32.7 32.3  RDW 17.3* 17.5* 17.3*  PLT 282 278 239   Thyroid  Recent Labs  Lab 04/22/22 0557  TSH 4.961*    BNP Recent Labs  Lab 04/21/22 2356  BNP 878.6*    DDimer  Recent Labs  Lab 04/22/22 0218  DDIMER 1.30*     Radiology    No results found.  Cardiac Studies   [facility] 04/25/22: Conclusions: Moderate-severe multivessel coronary artery disease, including 50% proximal LAD stenosis (mid/distal LAD are small with prominent first septal branch consistent with "double-barrel LAD), occlusion of large proximal D1 (distal vessel is supplied by patent LIMA graft), moderate diffuse LCx disease, occluded OM2 branch supplied by ectatic but patent SVG, and sequential proximal through distal RCA disease of up to 80-90% that is not hemodynamically significant (iFR = 0.99). Widely patent LIMA-D1. Ectatic but patent SVG-OM2. Patent overlapping proximal through distal RCA stents with mild in-stent restenosis in the proximal segment. Moderately elevated left heart filling pressures (LVEDP 25-30 mmHg, PWCP 30 mmHg with prominent V-waves). Moderately-severely elevated right heart and pulmonary artery pressures (mean RA 15 mmHg, mean PAP 42 mmHg). Low normal to mildly reduced Fick cardiac output/index (Fick CO/CI, 4.9 L/min, 2.4 L/min/m^2).   Recommendations: Continue medical therapy; no target for PCI.  I suspect elevated troponin represents supply-demand mismatch in the setting of chronic ischemic heart disease, acute on chronic HFrEF, and recent VT/VF with ICD shock. Aggressive diuresis as blood pressure and renal function allow. Escalate goal-directed medical therapy for HFrEF due to ischemic cardiomyopathy as blood pressure and renal function  allow. Aggressive secondary prevention of coronary artery disease. Restart IV heparin 2 hours after TR band removal; transition back to apixaban as soon as tomorrow if there is no evidence of bleeding or vascular injury from catheterization.   Echo 02/16/22: IMPRESSIONS   1. Left ventricular ejection fraction, by estimation, is 25 to 30%. The  left ventricle has severely decreased function. The left ventricle  demonstrates regional wall motion abnormalities (see scoring  diagram/findings for description). The left  ventricular internal cavity size was moderately dilated. There is mild  concentric left ventricular hypertrophy. Left ventricular diastolic  parameters are indeterminate. Elevated left ventricular end-diastolic  pressure.   2. Right ventricular systolic function is normal. The right ventricular  size is normal.   3. Left atrial size was severely dilated.   4. Right atrial size was mildly dilated.   5. The mitral valve is normal in structure. Mild mitral valve  regurgitation. No evidence of mitral stenosis.   6. The aortic valve is tricuspid. Aortic valve regurgitation is mild. No  aortic stenosis is present.   7. The inferior vena cava is normal in size with <50% respiratory  variability, suggesting right atrial pressure of 8 mmHg.   Patient Profile     Mr. Samek is a 62M with CAD s/p CABG, ischemic cardiomyopathy, s/p ICD, COPD, bipolar disorder admitted with appropriate ICD firing and acute on chronic systolic an diastolic HF.  Assessment & Plan    # Ischemic cardiomyopathy: # Acute on chronic systolic and diastolic heart failure: LVEF 25 to 30%.  Volume status is improving with diuresis.  He appears to be euvolemic today.  Will switch  lasix to 60mg . Continue aspirin and atorvastatin.  Continue losartan.  Blood pressure too low for Entresto at this time.  Recommend Jardiance or Farxiga at discharge.   # VT/VF status post ICD firing: Patient had appropriate ICD firing  prior to admission.  Cath revealed chronic ischemic disease but no clear culprit.  Elevated troponin 2/2 demand ischemia in the setting off supply-demand mismatch.  Digoxin has been discontinued and he is now on amiodarone.  Rhythm stable on telemetry.  Continue metoprolol.  Maintain K greater than 4, mag greater than 2.  Working on volume status as above.   # PAF: Currently not in A-fib and has appropriate a paced V paced rhythm.  Continue Eliquis and metoprolol.  Now off IV amiodarone and on oral.       For questions or updates, please contact [place] Please consult [email or web address] for contact info under        Signed, Skeet Latch, MD  04/28/2022, 11:29 AM

## 2022-04-28 NOTE — Progress Notes (Signed)
[facility] [place] NAME: Carl [name]    MR#:  [id number]  DATE OF BIRTH:  [birth year]  SUBJECTIVE:      VITALS:  Blood pressure 115/60, pulse 69, temperature 98.4 F (36.9 C), temperature source Oral, resp. rate 18, height 5\' 9"  (1.753 m), weight 83.5 kg, SpO2 98 %.  PHYSICAL EXAMINATION:   GENERAL:  61 y.o.-year-old patient lying in the bed with no acute distress.  LUNGS: Normal breath sounds bilaterally, no wheezing CARDIOVASCULAR: S1, S2 normal. No murmurs,   ABDOMEN: Soft, nontender, nondistended. Bowel sounds present.  EXTREMITIES: No  edema b/l.    NEUROLOGIC: nonfocal  patient is alert and awake SKIN: No obvious rash, lesion, or ulcer.   LABORATORY PANEL:  CBC Recent Labs  Lab 04/28/22 0611  WBC 10.7*  HGB 11.4*  HCT 35.3*  PLT 239    Chemistries  Recent Labs  Lab 04/21/22 2357 04/22/22 0557 04/28/22 0611  NA 136   < > 138  K 2.7*   < > 3.8  CL 99   < > 100  CO2 27   < > 31  GLUCOSE 153*   < > 99  BUN 11   < > 31*  CREATININE 1.20   < > 1.15  CALCIUM 9.2   < > 8.7*  MG  --    < > 2.2  AST 27  --   --   ALT 13  --   --   ALKPHOS 53  --   --   BILITOT 0.7  --   --    < > = values in this interval not displayed.    Assessment and Plan Carl [name] is a 61 y.o. Caucasian male with medical history significant for bipolar 1 disorder, systolic CHF, COPD, depression, hypertension, coronary artery disease, seizure disorder and CVA, who presented to the emergency room with acute onset of worsening dyspnea at rest with orthopnea and lower extremity edema.     Acute respiratory failure with hypoxia (HCC) --In the ED, respiratory rate of 31 and pulse oximetry was 84% on room air, 96% on 3 L of O2 by nasal cannula. --2/2 CHF exacerbation.  CXR showed Cardiomegaly with mild perihilar edema.   --Continue supplemental O2 to keep sats >=90%, wean as tolerated --cont diuresis, per cardio --remains on 2 liters    VT/VF status post ICD firing --A note from 10/16 documented Device alert for VF with successful therapy.  Tele showed pt having frequent short stretches of VTach.  Digoxin has been discontinued and he is now on amiodarone. --[facility] cardiology consulted  --cont oral amiodarone --cont Toprol   Acute on chronic HFrEF (heart failure with reduced ejection fraction) (HCC) --LVEF 25-30%, with ICD in place.  CXR showed Cardiomegaly with mild perihilar edema.   --right and left heart cath showed mod-severe multivessel CAD, mod-severe pulm artery pressures Plan: --cont IV lasix 40 BID, per cardio--change to po home dose lasix 60 mg qd --cont Toprol, losartan --Blood pressure too low for Entresto at this time.   --Recommend Jardiance or Farxiga at discharge.   Hx of CAD s/p CABG --right and left heart cath showed mod-severe multivessel CAD, patent grafts overall --cont ASA and statin   Hypokalemia --monitor and replete PRN   Hypothyroidism -Continue Synthroid   Bipolar 1 disorder (Carl [name]) - continue Seroquel and Lexapro.   Paroxysmal atrial fibrillation (HCC) - continue Eliquis  --cont amio and Toprol   History of seizure -  continue Depakote ER and Keppra.   Dyslipidemia -continue statin therapy.   Cognitive deficit --has DSS legal guardian     DVT prophylaxis: [id number] Code Status: Full code  Family Communication:  Level of care: Progressive Dispo:   The patient is from: [name] Anticipated d/c is to: Group home with HHPT--pt ambulated and did a lap around the unit Anticipated d/c date is: tomorrow   TOTAL TIME TAKING CARE OF THIS PATIENT: 35 minutes.  >50% time spent on counselling and coordination of care  Note: This dictation was prepared with Dragon dictation along with smaller phrase technology. Any transcriptional errors that result from this process are unintentional.  Enedina Finner M.D    [facility]   CC: Primary care physician; [facility],  [facility]

## 2022-04-29 DIAGNOSIS — I472 Ventricular tachycardia, unspecified: Secondary | ICD-10-CM | POA: Diagnosis not present

## 2022-04-29 DIAGNOSIS — J9601 Acute respiratory failure with hypoxia: Secondary | ICD-10-CM | POA: Diagnosis not present

## 2022-04-29 DIAGNOSIS — I5023 Acute on chronic systolic (congestive) heart failure: Secondary | ICD-10-CM | POA: Diagnosis not present

## 2022-04-29 LAB — BASIC METABOLIC PANEL
Anion gap: 8 (ref 5–15)
BUN: 26 mg/dL — ABNORMAL HIGH (ref 8–23)
CO2: 29 mmol/L (ref 22–32)
Calcium: 8.7 mg/dL — ABNORMAL LOW (ref 8.9–10.3)
Chloride: 99 mmol/L (ref 98–111)
Creatinine, Ser: 1.13 mg/dL (ref 0.61–1.24)
GFR, Estimated: >60 mL/min (ref >60)
Glucose, Bld: 146 mg/dL — ABNORMAL HIGH (ref 70–99)
Potassium: 4 mmol/L (ref 3.5–5.1)
Sodium: 136 mmol/L (ref 135–145)

## 2022-04-29 LAB — MAGNESIUM: Magnesium: 2.1 mg/dL (ref 1.7–2.4)

## 2022-04-29 MED ORDER — DAPAGLIFLOZIN PROPANEDIOL 10 MG PO TABS
10.0000 mg | ORAL_TABLET | Freq: Every day | ORAL | Status: DC
  Administered 2022-04-29 – 2022-04-30 (×2): 10 mg via ORAL
  Filled 2022-04-29 (×2): qty 1

## 2022-04-29 NOTE — Progress Notes (Addendum)
04/28/22 1300  PT Visit Information  Last PT Received On 04/28/22  Assistance Needed +1  History of Present Illness Carl [name] is a 61yoM who comes to [facility] on 10/16 from [name] c SOB. PMH: COPD, CHF, BPD, PAF, seizure DO, CVA. Prior to his arrival to the hospital yesterday, the office was notified of a ventricular fibrillation event requiring ICD shock.  Subjective Data  Subjective I need a new wheelchair  Patient Stated Goal to return to group home  Precautions  Precautions Fall  Restrictions  Weight Bearing Restrictions No  Pain Assessment  Pain Assessment No/denies pain  Cognition  Arousal/Alertness Awake/alert  Behavior During Therapy [facility] for tasks assessed/performed  Overall Cognitive Status Difficult to assess  General Comments patient able to follow single step commands consistently.  Difficult to assess due to Impaired communication (expressive language deficits)  Bed Mobility  Overal bed mobility Needs Assistance  Bed Mobility Supine to Sit;Sit to Supine  Supine to sit Min assist  Sit to supine Supervision  General bed mobility comments assistance for trunk support to sit upright  Transfers  Overall transfer level Needs assistance  Transfers Sit to/from Stand  Sit to Stand Min guard  General transfer comment verbal cues for hand placement with standing  Ambulation/Gait  Ambulation/Gait assistance Min guard;Supervision  Gait Distance (Feet) 175 Feet  Assistive device Rolling walker (2 wheels)  Gait Pattern/deviations Step-through pattern  General Gait Details occasional safety cues with ambulation using rolling walker. no loss of balance. Sp02 95% on 2 L02 after walking with only mild dyspnea noted.  Gait velocity decreased  Wheelchair Mobility  Wheelchair mobility  (Patient reports he uses a wheelchair as primary means of mobility at group home. He is requesting a new wheelchair)  Balance  Overall balance assessment Needs assistance  Sitting-balance  support Feet supported  Sitting balance-Leahy Scale Fair  Sitting balance - Comments no loss of balance in sitting position  Standing balance support During functional activity;Bilateral upper extremity supported  Standing balance-Leahy Scale Fair  PT - End of Session  Equipment Utilized During Treatment Oxygen  Activity Tolerance Patient tolerated treatment well  Patient left in bed;with call bell/phone within reach;with bed alarm set  Nurse Communication Mobility status   PT - Assessment/Plan  PT Plan Discharge plan needs to be updated  PT Visit Diagnosis Unsteadiness on feet (R26.81);Other abnormalities of gait and mobility (R26.89)  PT Frequency (ACUTE ONLY) Min 2X/week  Follow Up Recommendations Home health PT  Can patient physically be transported by private vehicle Yes  Assistance recommended at discharge Set up Supervision/Assistance  Patient can return home with the following Help with stairs or ramp for entrance;Assistance with cooking/housework;Assist for transportation;A little help with walking and/or transfers;A little help with bathing/dressing/bathroom  PT equipment Wheelchair (measurements PT) (patient is requesting to have a new wheelchair)  AM-PAC PT "6 Clicks" Mobility Outcome Measure (Version 2)  Help needed turning from your back to your side while in a flat bed without using bedrails? 4  Help needed moving from lying on your back to sitting on the side of a flat bed without using bedrails? 3  Help needed moving to and from a bed to a chair (including a wheelchair)? 3  Help needed standing up from a chair using your arms (e.g., wheelchair or bedside chair)? 3  Help needed to walk in hospital room? 3  Help needed climbing 3-5 steps with a railing?  3  6 Click Score 19  Consider Recommendation of  Discharge To: Home with [facility]  Progressive Mobility  What is the highest level of mobility based on the progressive mobility assessment? Level 5 (Walks with assist in  room/hall) - Balance while stepping forward/back and can walk in room with assist - Complete  Mobility Referral Yes  Activity Ambulated with assistance in hallway  PT Goal Progression  Progress towards PT goals Progressing toward goals  Acute Rehab PT Goals  PT Goal Formulation With patient  PT Time Calculation  PT Start Time (ACUTE ONLY) 1102  PT Stop Time (ACUTE ONLY) 1135  PT Time Calculation (min) (ACUTE ONLY) 33 min  PT General Charges  $$ ACUTE PT VISIT 1 Visit  PT Treatments  $Therapeutic Activity 23-37 mins  Minna Merritts, PT, MPT

## 2022-04-29 NOTE — Progress Notes (Signed)
Progress Note  Patient Name: Carl [name] Date of Encounter: 04/29/2022  Primary Cardiologist: Kirke Corin  Subjective   No chest pain, palpitations, dizziness, presyncope, or syncope.  Dyspnea significantly improved.  Has ambulated around the nurses station multiple times without cardiac limitation.  Feels back to baseline.  Documented urine output 1.2 L for the past 24 hours with a net -3.2 L for the admission.  Weight trend 83.5 to 82.2 kg over the past 24 hours.  Inpatient Medications    Scheduled Meds:  amiodarone  400 mg Oral Daily   apixaban  5 mg Oral BID   aspirin EC  81 mg Oral Daily   atorvastatin  40 mg Oral Daily   dapagliflozin propanediol  10 mg Oral Daily   divalproex  500 mg Oral BID   escitalopram  10 mg Oral QHS   ezetimibe  10 mg Oral Daily   feeding supplement  237 mL Oral BID BM   furosemide  60 mg Oral Daily   hydrOXYzine  10 mg Oral BID   levETIRAcetam  250 mg Oral BID   levothyroxine  37.5 mcg Oral Q0600   losartan  12.5 mg Oral Daily   metoprolol succinate  12.5 mg Oral QHS   multivitamin with minerals  1 tablet Oral Daily   pantoprazole  40 mg Oral Daily   sodium chloride flush  3 mL Intravenous Q12H   sodium chloride flush  3 mL Intravenous Q12H   tiotropium  18 mcg Inhalation Daily   Continuous Infusions:  sodium chloride Stopped (04/26/22 1546)   PRN Meds: sodium chloride, acetaminophen **OR** acetaminophen, albuterol, magnesium hydroxide, ondansetron **OR** ondansetron (ZOFRAN) IV, sodium chloride flush, traMADol, traZODone   Vital Signs    Vitals:   04/28/22 2011 04/29/22 0500 04/29/22 0812 04/29/22 1224  BP: 106/64  101/60 114/60  Pulse: 63  60 64  Resp:   18 18  Temp: 97.7 F (36.5 C)  97.7 F (36.5 C) (!) 97.4 F (36.3 C)  TempSrc: Oral  Oral Oral  SpO2: 100%  98% 97%  Weight:  82.2 kg    Height:        Intake/Output Summary (Last 24 hours) at 04/29/2022 1240 Last data filed at 04/29/2022 1100 Gross per 24 hour  Intake 480  ml  Output 1551 ml  Net -1071 ml   Filed Weights   04/27/22 0431 04/28/22 0614 04/29/22 0500  Weight: 84.4 kg 83.5 kg 82.2 kg    Telemetry    Paced rhythm with a 15 beat run of NSVT - Personally Reviewed  ECG    No new tracings - Personally Reviewed  Physical Exam   GEN: No acute distress.   Neck: No JVD. Cardiac: RRR, no murmurs, rubs, or gallops.  Respiratory: Clear to auscultation bilaterally.  GI: Soft, nontender, non-distended.   MS: No edema; No deformity. Neuro:  Alert and oriented x 3; Nonfocal.  Psych: Normal affect.  Labs    Chemistry Recent Labs  Lab 04/27/22 0543 04/28/22 0611 04/29/22 1119  NA 138 138 136  K 3.7 3.8 4.0  CL 98 100 99  CO2 31 31 29   GLUCOSE 103* 99 146*  BUN 37* 31* 26*  CREATININE 1.29* 1.15 1.13  CALCIUM 8.7* 8.7* 8.7*  GFRNONAA >60 >60 >60  ANIONGAP 9 7 8      Hematology Recent Labs  Lab 04/26/22 0021 04/27/22 0543 04/28/22 0611  WBC 11.2* 12.9* 10.7*  RBC 4.08* 4.12* 4.03*  HGB 11.9* 11.7* 11.4*  HCT  35.9* 35.8* 35.3*  MCV 88.0 86.9 87.6  MCH 29.2 28.4 28.3  MCHC 33.1 32.7 32.3  RDW 17.3* 17.5* 17.3*  PLT 282 278 239    Cardiac EnzymesNo results for input(s): "TROPONINI" in the last 168 hours. No results for input(s): "TROPIPOC" in the last 168 hours.   BNPNo results for input(s): "BNP", "PROBNP" in the last 168 hours.   DDimer No results for input(s): "DDIMER" in the last 168 hours.   Radiology    No results found.  Cardiac Studies   [facility] 04/25/2022: Conclusions: Moderate-severe multivessel coronary artery disease, including 50% proximal LAD stenosis (mid/distal LAD are small with prominent first septal branch consistent with "double-barrel LAD), occlusion of large proximal D1 (distal vessel is supplied by patent LIMA graft), moderate diffuse LCx disease, occluded OM2 branch supplied by ectatic but patent SVG, and sequential proximal through distal RCA disease of up to 80-90% that is not hemodynamically  significant (iFR = 0.99). Widely patent LIMA-D1. Ectatic but patent SVG-OM2. Patent overlapping proximal through distal RCA stents with mild in-stent restenosis in the proximal segment. Moderately elevated left heart filling pressures (LVEDP 25-30 mmHg, PWCP 30 mmHg with prominent V-waves). Moderately-severely elevated right heart and pulmonary artery pressures (mean RA 15 mmHg, mean PAP 42 mmHg). Low normal to mildly reduced Fick cardiac output/index (Fick CO/CI, 4.9 L/min, 2.4 L/min/m^2).   Recommendations: Continue medical therapy; no target for PCI.  I suspect elevated troponin represents supply-demand mismatch in the setting of chronic ischemic heart disease, acute on chronic HFrEF, and recent VT/VF with ICD shock. Aggressive diuresis as blood pressure and renal function allow. Escalate goal-directed medical therapy for HFrEF due to ischemic cardiomyopathy as blood pressure and renal function allow. Aggressive secondary prevention of coronary artery disease. Restart IV heparin 2 hours after TR band removal; transition back to apixaban as soon as tomorrow if there is no evidence of bleeding or vascular injury from catheterization.   Findings and recommendations were discussed by telephone with the patient's legal guardian.  Patient Profile     61 y.o. male with history of CAD status post CABG, HFrEF secondary to ICM status post ICD, bipolar disorder, and COPD admitted with appropriate ICD firing in the context of electrolyte abnormality and acute on chronic combined systolic and diastolic CHF.  Assessment & Plan    1.  VT/VF status post ICD firing: -Likely in the context of significant hypokalemia and noted hypomagnesemia as well as volume overload -He did have a 15 beat run of NSVT earlier this morning and was asymptomatic -Potassium and magnesium remain at goal 4.0 and 2.0 respectively -Digoxin has been discontinued, and he is now on amiodarone -Continue metoprolol -Cath this  admission with moderate to severe multivessel CAD as outlined above with no target for PCI -Follow-up with EP  2.  Acute on chronic combined systolic and diastolic CHF: -Volume status improved -Euvolemic -Add Farxiga -Continue current medical therapy including losartan and furosemide -Blood pressure too low for initiation of Entresto -Escalate GDMT as able in the outpatient setting -CHF education  3.  CAD: -Cardiac cath this admission showed moderate to severe multivessel CAD with no target for PCI -Continue aggressive risk factor modification and medical therapy including aspirin, atorvastatin, ezetimibe, losartan, and Toprol-XL  4.  PAF: -Currently appropriately paced -He remains on metoprolol and apixaban along with newly started amiodarone       For questions or updates, please contact [facility] Please consult [email or web address] for contact info under Cardiology/STEMI.    Signed, Alycia Rossetti  [facility] Pager: [phone] 04/29/2022, 12:40 PM

## 2022-04-29 NOTE — Progress Notes (Addendum)
Spoke with patient's Legal Guardian, [name] Gobbel.  She stated [name] from DSS is assigned this case. [name] is not present for work.   VM and email left for Toula Moos for follow up on if patient can return to [facility] and to confirm discharge plan.   1:30pm Retrieved email from Toula Moos. Melida Quitter, [name] Owner copied in on conversation. Call placed to [facility]. Patient can return to facility 10/24. [facility] to transport. He is agreeable to [facility] via [facility].  Referral sent and accepted by Floydene Flock from [place]. MD notified.   9:49pm Update regarding discharge plan emailed  to Toula Moos and [name] from [place].

## 2022-04-29 NOTE — Progress Notes (Signed)
[place] [facility] [place] NAME: Carl [name]    MR#:  [id number]  DATE OF BIRTH:  September 01, 1960  SUBJECTIVE:  patient did very well with physical therapy. Saturations remain more than 92% on room air with ambulation. Heart rate remains stable. No chest pain or shortness of breath.    VITALS:  Blood pressure 114/60, pulse 64, temperature (!) 97.4 F (36.3 C), temperature source Oral, resp. rate 18, height 5\' 9"  (1.753 m), weight 82.2 kg, SpO2 97 %.  PHYSICAL EXAMINATION:   GENERAL:  61 y.o.-year-old patient lying in the bed with no acute distress.  LUNGS: Normal breath sounds bilaterally, no wheezing CARDIOVASCULAR: S1, S2 normal. No murmurs,   ABDOMEN: Soft, nontender, nondistended. Bowel sounds present.  EXTREMITIES: No  edema b/l.    NEUROLOGIC: nonfocal  patient is alert and awake SKIN: No obvious rash, lesion, or ulcer.   LABORATORY PANEL:  CBC Recent Labs  Lab 04/28/22 0611  WBC 10.7*  HGB 11.4*  HCT 35.3*  PLT 239     Chemistries  Recent Labs  Lab 04/29/22 1119  NA 136  K 4.0  CL 99  CO2 29  GLUCOSE 146*  BUN 26*  CREATININE 1.13  CALCIUM 8.7*  MG 2.1     Assessment and Plan Carl [name] is a 61 y.o. Caucasian male with medical history significant for bipolar 1 disorder, systolic CHF, COPD, depression, hypertension, coronary artery disease, seizure disorder and CVA, who presented to the emergency room with acute onset of worsening dyspnea at rest with orthopnea and lower extremity edema.     Acute respiratory failure with hypoxia (HCC) due to congestive heart failure acute on chronic systolic --In the ED, respiratory rate of 31 and pulse oximetry was 84% on room air, 96% on 3 L of O2 by nasal cannula. --2/2 CHF exacerbation.  CXR showed Cardiomegaly with mild perihilar edema.   --Continue supplemental O2 to keep sats >=90%, wean as tolerated --cont diuresis, per cardio -- patient weaned off to room air.   VT/VF  status post ICD firing --A note from 10/16 documented Device alert for VF with successful therapy.  Tele showed pt having frequent short stretches of VTach.  Digoxin has been discontinued and he is now on amiodarone. --[facility] cardiology consulted  --cont oral amiodarone, Toprol -- electrolytes stable.   Acute on chronic HFrEF (heart failure with reduced ejection fraction) (HCC) --LVEF 25-30%, with ICD in place.   --CXR showed Cardiomegaly with mild perihilar edema.   --right and left heart cath showed mod-severe multivessel CAD, mod-severe pulm artery pressures --cont IV lasix 40 BID, per cardio--change to po home dose lasix 60 mg qd --cont Toprol, losartan --Blood pressure too low for Entresto at this time.   --started [place] at discharge.   Hx of CAD s/p CABG --right and left heart cath showed mod-severe multivessel CAD, patent grafts overall --cont ASA and statin   Hypokalemia --monitor and replete PRN   Hypothyroidism -Continue Synthroid   Bipolar 1 disorder ([place]) - continue Seroquel and Lexapro.   Paroxysmal atrial fibrillation (HCC) - continue Eliquis  --cont amio and Toprol   History of seizure -continue Depakote ER and Keppra.   Dyslipidemia -continue statin therapy.   Cognitive deficit --has DSS legal guardian     DVT prophylaxis: [id number] Code Status: Full code  Family Communication: none today. DSS guardian aware of plan Level of care: Progressive Dispo:   The patient is from: [facility] Anticipated d/c  is to: Group home with HHPT--pt ambulated and did a lap around the unit--ready for d/c Anticipated d/c date is: tomorrow  04/30/22 per Group home director   TOTAL TIME TAKING CARE OF THIS PATIENT: 35 minutes.  >50% time spent on counselling and coordination of care  Note: This dictation was prepared with Dragon dictation along with smaller phrase technology. Any transcriptional errors that result from this process are unintentional.  Fritzi Mandes  M.D    [facility]   CC: Primary care physician; [facility]

## 2022-04-29 NOTE — Progress Notes (Addendum)
Physical Therapy Treatment Patient Details Name: Carl [name] MRN: [id number] DOB: [birth year] Today's Date: 04/29/2022   History of Present Illness Carl [name] is a 54yoM who comes to [facility] on 10/16 from [facility] c SOB. PMH: COPD, CHF, BPD, PAF, seizure DO, CVA. Prior to his arrival to the hospital yesterday, the office was notified of a ventricular fibrillation event requiring ICD shock.    PT Comments    Patient is agreeable to PT session. The patient required occasional cues during bed mobility and transfers for increased independence with functional mobility. He ambulated a lap in the hallway on room air with Sp02 94% using rolling walker. Patient is making progress towards meeting PT goals. Recommend to continue PT to maximize independence and facilitate return to prior level of function. HHPT recommended.    Patient Saturation on 2L 02 at rest = 99%  Patient Saturations on Room Air at Rest = 95%  Patient Saturations on Room Air while Ambulating = 94%    Recommendations for follow up therapy are one component of a multi-disciplinary discharge planning process, led by the attending physician.  Recommendations may be updated based on patient status, additional functional criteria and insurance authorization.  Follow Up Recommendations  Home health PT Can patient physically be transported by private vehicle: Yes   Assistance Recommended at Discharge Set up Supervision/Assistance  Patient can return home with the following Help with stairs or ramp for entrance;Assistance with cooking/housework;Assist for transportation;A little help with walking and/or transfers;A little help with bathing/dressing/bathroom   Equipment Recommendations  Wheelchair (measurements PT)    Recommendations for Other Services       Precautions / Restrictions Precautions Precautions: Fall Restrictions Weight Bearing Restrictions: No     Mobility  Bed Mobility Overal bed mobility: Needs  Assistance Bed Mobility: Supine to Sit, Sit to Supine     Supine to sit: Min assist Sit to supine: Supervision   General bed mobility comments: assistance for trunk support to sit upright. cues for technique to increase independence    Transfers Overall transfer level: Needs assistance Equipment used: Rolling walker (2 wheels) Transfers: Sit to/from Stand Sit to Stand: Min guard           General transfer comment: cues for safe hand placement    Ambulation/Gait Ambulation/Gait assistance: Min guard Gait Distance (Feet): 175 Feet Assistive device: Rolling walker (2 wheels) Gait Pattern/deviations: Step-through pattern Gait velocity: decreased     General Gait Details: occasional cues to avoid running into objects on the left. Sp02 94% on room air with walking.   Stairs             Wheelchair Mobility    Modified Rankin (Stroke Patients Only)       Balance Overall balance assessment: Needs assistance Sitting-balance support: Feet supported Sitting balance-Leahy Scale: Fair Sitting balance - Comments: no loss of balance in sitting position   Standing balance support: Bilateral upper extremity supported Standing balance-Leahy Scale: Fair                              Cognition Arousal/Alertness: Awake/alert Behavior During Therapy: WFL for tasks assessed/performed Overall Cognitive Status: Difficult to assess                                 General Comments: patient able to follow single step commands consistently.  Exercises      General Comments        Pertinent Vitals/Pain Pain Assessment Pain Assessment: No/denies pain    Home Living                          Prior Function            PT Goals (current goals can now be found in the care plan section) Acute Rehab PT Goals Patient Stated Goal: to return to group home PT Goal Formulation: With patient Progress towards PT goals: Progressing  toward goals    Frequency    Min 2X/week      PT Plan Current plan remains appropriate    Co-evaluation              AM-PAC PT "6 Clicks" Mobility   Outcome Measure  Help needed turning from your back to your side while in a flat bed without using bedrails?: None Help needed moving from lying on your back to sitting on the side of a flat bed without using bedrails?: A Little Help needed moving to and from a bed to a chair (including a wheelchair)?: A Little Help needed standing up from a chair using your arms (e.g., wheelchair or bedside chair)?: A Little Help needed to walk in hospital room?: A Little Help needed climbing 3-5 steps with a railing? : A Little 6 Click Score: 19    End of Session Equipment Utilized During Treatment: Gait belt Activity Tolerance: Patient tolerated treatment well Patient left: in bed;with call bell/phone within reach;with bed alarm set Nurse Communication: Mobility status PT Visit Diagnosis: Unsteadiness on feet (R26.81);Other abnormalities of gait and mobility (R26.89)     Time: 2248-2500 PT Time Calculation (min) (ACUTE ONLY): 21 min  Charges:  $Therapeutic Activity: 8-22 mins                     Donna Bernard, PT, MPT    In[facility] 04/29/2022, 9:25 AM

## 2022-04-30 ENCOUNTER — Ambulatory Visit: Payer: Medicaid Other | Admitting: Family

## 2022-04-30 DIAGNOSIS — J9601 Acute respiratory failure with hypoxia: Secondary | ICD-10-CM | POA: Diagnosis not present

## 2022-04-30 MED ORDER — LOSARTAN POTASSIUM 25 MG PO TABS: 12.5000 mg | ORAL_TABLET | Freq: Every day | ORAL | 1 refills | Status: DC

## 2022-04-30 MED ORDER — AMIODARONE HCL 400 MG PO TABS: 400.0000 mg | ORAL_TABLET | Freq: Every day | ORAL | 1 refills | Status: DC

## 2022-04-30 MED ORDER — DAPAGLIFLOZIN PROPANEDIOL 10 MG PO TABS: 10.0000 mg | ORAL_TABLET | Freq: Every day | ORAL | 1 refills | Status: DC

## 2022-04-30 MED ORDER — METOPROLOL SUCCINATE ER 25 MG PO TB24: 12.5000 mg | ORAL_TABLET | Freq: Every day | ORAL | 1 refills | Status: DC

## 2022-04-30 MED ORDER — ADULT MULTIVITAMIN W/MINERALS CH: 1.0000 | ORAL_TABLET | Freq: Every day | ORAL | 0 refills | Status: DC

## 2022-04-30 NOTE — TOC Transition Note (Signed)
Transition of Care [facility]) - CM/SW Discharge Note   Patient Details  Name: Carl [name] MRN: [id number] Date of Birth: [birth year]  [facility] CM/SW Contact:  Alberteen Sam, LCSW Phone Number: 04/30/2022, 10:26 AM   Clinical Narrative:     Patient will discharge to [facility], Madelynn Done will pick patient up at 12:30 today. DC summary and fl2 in dc packet, RN made aware.no further dc needs.  Final next level of care: Assisted Living Barriers to Discharge: No Barriers Identified   Patient Goals and CMS Choice Patient states their goals for this hospitalization and ongoing recovery are:: to go home CMS Medicare.gov Compare Post Acute Care list provided to:: Patient Choice offered to / list presented to : Patient  Discharge Placement                       Discharge Plan and Services     Post Acute Care Choice: NA                               Social Determinants of Health (SDOH) Interventions     Readmission Risk Interventions    03/15/2022    2:44 PM  Readmission Risk Prevention Plan  HRI or [place] Complete  Social Work Consult for [place] Planning/Counseling Complete  Palliative Care Screening Not Applicable  Medication Review Press photographer) Complete

## 2022-04-30 NOTE — Telephone Encounter (Signed)
Patient returned call and Shayna advise we need transmission. States he will send today. No transmission received as of yet.

## 2022-04-30 NOTE — Telephone Encounter (Signed)
Attempted to follow up. No answer, LMTCB. 

## 2022-04-30 NOTE — Telephone Encounter (Signed)
Patient returned call I have let patient know that he needs to send a manual transmission to review if he is in afib. Patient is going to send a transmission now and would like a call back today

## 2022-04-30 NOTE — NC FL2 (Signed)
[place] LEVEL OF CARE SCREENING TOOL     IDENTIFICATION  Patient Name: Carl [name] Birthdate: 04/18/1961 Sex: male Admission Date (Current Location): 04/22/2022  [facility] and [place] Number:  Engineering geologist and Address:  [facility], [address], [place], [place] [address]      Provider Number: [id number]  Attending Physician Name and Address:  Fritzi Mandes, MD  Relative Name and Phone Number:       Current Level of Care: Hospital Recommended Level of Care: [place] Prior Approval Number:    Date Approved/Denied:   PASRR Number:    Discharge Plan: Other (Comment) (ALF)    Current Diagnoses: Patient Active Problem List   Diagnosis Date Noted   Defibrillator discharge 04/27/2022   Non-ST elevation (NSTEMI) myocardial infarction [facility])    Ventricular fibrillation ([place])    Ventricular tachycardia ([place]) 04/23/2022   Acute respiratory failure with hypoxia ([place]) 04/22/2022   Hypokalemia 04/22/2022   Demand ischemia    Type 2 diabetes mellitus with hyperglycemia ([place]) 03/25/2022   Overweight (BMI 25.0-29.9) 03/23/2022   COVID-19 virus infection 03/23/2022   Respiratory failure ([place]) 03/22/2022   Dilated cardiomyopathy ([place])    Chest pain    COPD exacerbation ([place]) 03/14/2022   Seizure disorder ([place]) 03/14/2022   Dyspnea    Congestive heart failure ([place]) 02/15/2022   Anxiety 02/15/2022   Tobacco use 02/15/2022   Elevated partial thromboplastin time (PTT) 02/15/2022   Abdominal pain 02/15/2022   HFrEF (heart failure with reduced ejection fraction) ([place])    Weakness    COPD with acute exacerbation ([place]) 05/23/2021   Paroxysmal atrial fibrillation ([place]) 05/23/2021   Chronic anticoagulation 05/23/2021   Abdominal aortic aneurysm (AAA) 35 to 39 mm in diameter ([place]) 05/23/2021   Ischemic cardiomyopathy [id number]   Chronic systolic heart failure ([place]) 05/22/2020   HCAP (healthcare-associated pneumonia)  01/24/2020   Hyperkalemia 01/24/2020   Acute on chronic combined systolic (congestive) and diastolic (congestive) heart failure ([place]) 01/24/2020   History of seizure 01/24/2020   HLD (hyperlipidemia) 01/24/2020   Ascites due to alcoholic cirrhosis ([place])    Acute on chronic HFrEF (heart failure with reduced ejection fraction) ([place]) 11/18/2019   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/18/2019   Elevated troponin 11/18/2019   Hypoxia 11/18/2019   Coronary artery disease 05/15/2019   History of stroke 05/15/2019   Homicidal ideation 05/15/2019   Hypothyroidism 05/15/2019   Constipation 05/06/2019   Scrotal mass 05/06/2019   Abnormal urine odor 05/06/2019   Bipolar 1 disorder ([place]) 12/23/2018   Chronic pain of left knee 09/17/2018   Atypical chest pain 10/11/2017   Polypharmacy 08/05/2017   Cerebrovascular accident (CVA) due to stenosis of left middle cerebral artery ([place]) 06/26/2017   Urinary retention due to benign prostatic hyperplasia 06/22/2017   Falls 06/20/2017   Risk for falls 02/19/2017   Dyslipidemia 11/19/2016   Peripheral artery disease ([place]) 08/28/2016   Claudication ([place]) 08/09/2016   History of alcohol abuse 08/09/2016   History of drug dependence/abuse ([place]) 08/09/2016   Severe single current episode of major depressive disorder, without psychotic features ([place]) 08/09/2016   Mitral insufficiency 01/04/2016   Prediabetes 12/29/2015   Essential hypertension 12/29/2015   Fracture of left clavicle 07/13/2012    Orientation RESPIRATION BLADDER Height & Weight     Self, Place  Normal Incontinent, External catheter Weight: 175 lb 11.3 oz (79.7 kg) Height:  5\' 9"  (175.3 cm)  BEHAVIORAL SYMPTOMS/MOOD NEUROLOGICAL BOWEL NUTRITION STATUS  Incontinent Diet low sodium heart healthy  AMBULATORY STATUS COMMUNICATION OF NEEDS Skin   Limited Assist Verbally Normal                       Personal Care Assistance Level of Assistance  Bathing, Feeding,  Dressing, Total care Bathing Assistance: Limited assistance Feeding assistance: Independent Dressing Assistance: Limited assistance     Functional Limitations Info  Sight, Hearing, Speech Sight Info: Impaired Hearing Info: Impaired Speech Info: Adequate    SPECIAL CARE FACTORS FREQUENCY                       Contractures Contractures Info: Not present    Additional Factors Info  Code Status, Allergies Code Status Info: full Allergies Info: Iodinated Contrast Media  Penicillins  Strawberry Extract  Cefepime  Erythromycin  Sulfa Antibiotics           Discharge Medications:  acetaminophen 500 MG tablet Commonly known as: TYLENOL Take 500 mg by mouth every 6 (six) hours as needed for mild pain.    albuterol 108 (90 Base) MCG/ACT inhaler Commonly known as: VENTOLIN HFA Inhale 2 puffs into the lungs every 6 (six) hours as needed for wheezing.    albuterol (2.5 MG/3ML) 0.083% nebulizer solution Commonly known as: PROVENTIL Take 3 mLs (2.5 mg total) by nebulization every 6 (six) hours as needed for wheezing or shortness of breath.    amiodarone 400 MG tablet Commonly known as: PACERONE Take 1 tablet (400 mg total) by mouth daily. Start taking on: May 01, 2022    atorvastatin 40 MG tablet Commonly known as: LIPITOR Take 40 mg by mouth daily.    dapagliflozin propanediol 10 MG Tabs tablet Commonly known as: FARXIGA Take 1 tablet (10 mg total) by mouth daily. Start taking on: May 01, 2022    divalproex 500 MG 24 hr tablet Commonly known as: DEPAKOTE ER Take 1 tablet (500 mg total) by mouth 2 (two) times daily.    Eliquis 5 MG Tabs tablet Generic drug: apixaban TAKE 1 TABLET BY MOUTH TWICE A DAY    escitalopram 10 MG tablet Commonly known as: LEXAPRO Take 1 tablet (10 mg total) by mouth at bedtime.    ezetimibe 10 MG tablet Commonly known as: ZETIA Take 1 tablet (10 mg total) by mouth daily.    furosemide 20 MG tablet Commonly known as:  LASIX Take 3 tablets (60 mg) by mouth once daily    hydrOXYzine 10 MG tablet Commonly known as: ATARAX Take 10 mg by mouth 2 (two) times daily.    levETIRAcetam 250 MG tablet Commonly known as: Keppra Take 1 tablet (250 mg total) by mouth 2 (two) times daily.    levothyroxine 25 MCG tablet Commonly known as: SYNTHROID Take 1 tablet (25 mcg total) by mouth daily.    losartan 25 MG tablet Commonly known as: COZAAR Take 0.5 tablets (12.5 mg total) by mouth daily. Start taking on: May 01, 2022    metoprolol succinate 25 MG 24 hr tablet Commonly known as: TOPROL-XL Take 0.5 tablets (12.5 mg total) by mouth at bedtime.    multivitamin with minerals Tabs tablet Take 1 tablet by mouth daily. Start taking on: May 01, 2022    omeprazole 20 MG capsule Commonly known as: PRILOSEC Take 20 mg by mouth daily.    QUEtiapine 200 MG tablet Commonly known as: SEROQUEL Take 200 mg by mouth at bedtime.    tiotropium 18 MCG inhalation capsule  Commonly known as: SPIRIVA Place 18 mcg into inhaler and inhale daily.    traMADol 50 MG tablet Commonly known as: ULTRAM Take 1 tablet (50 mg total) by mouth 2 (two) times daily as needed.          Relevant Imaging Results:  Relevant Lab Results:   Additional [place], LCSW

## 2022-04-30 NOTE — Discharge Summary (Addendum)
Physician Discharge Summary   Patient: Carl [name] MRN: [id number] DOB: 04-23-1961  Admit date:     04/22/2022  Discharge date: 04/30/22  Discharge Physician: Fritzi Mandes   PCP: [facility]   Recommendations at discharge:    F/u [facility] cardiology in 1-2 weeks  Discharge Diagnoses: Principal Problem:   Acute respiratory failure with hypoxia [facility]) Active Problems:   Ventricular tachycardia ([place])   Acute on chronic HFrEF (heart failure with reduced ejection fraction) (HCC)   Hypokalemia   Hypothyroidism   Bipolar 1 disorder (HCC)   Paroxysmal atrial fibrillation (HCC)   History of seizure   Dyslipidemia   Non-ST elevation (NSTEMI) myocardial infarction [facility])   Ventricular fibrillation [facility])   Defibrillator discharge  Hospital Course:  Carl [name] is a 61 y.o. Caucasian male with medical history significant for bipolar 1 disorder, systolic CHF, COPD, depression, hypertension, coronary artery disease, seizure disorder and CVA, who presented to the emergency room with acute onset of worsening dyspnea at rest with orthopnea and lower extremity edema.     Acute respiratory failure with hypoxia (HCC) due to congestive heart failure acute on chronic systolic --In the ED, respiratory rate of 31 and pulse oximetry was 84% on room air, 96% on 3 L of O2 by nasal cannula. --2/2 CHF exacerbation.  CXR showed Cardiomegaly with mild perihilar edema.   --cont home dose lasix now per cardio -- patient weaned off to room air.   VT/VF status post ICD firing --A note from 10/16 documented Device alert for VF with successful therapy.  Tele showed pt having frequent short stretches of VTach.  Digoxin has been discontinued and he is now on amiodarone. --[facility] cardiology consulted  --cont oral amiodarone, Toprol -- electrolytes stable.   Acute on chronic HFrEF (heart failure with reduced ejection fraction) (HCC) --LVEF 25-30%, with ICD in place.   --CXR showed Cardiomegaly with mild  perihilar edema.   --right and left heart cath showed mod-severe multivessel CAD, mod-severe pulm artery pressures --cont IV lasix 40 BID, per cardio--change to po home dose lasix 60 mg qd --cont Toprol, losartan --started [place] at discharge.   Hx of CAD s/p CABG --right and left heart cath showed mod-severe multivessel CAD, patent grafts overall --cont ASA and statin   Hypokalemia/low mag --monitor and replete PRN   Hypothyroidism -Continue Synthroid   Bipolar 1 disorder (HCC) - on Seroquel and Lexapro.   Paroxysmal atrial fibrillation (HCC) - on Eliquis  --cont amiodarone and Toprol   History of seizure -continue Depakote ER and Keppra.   Dyslipidemia -continue statin therapy.   Cognitive deficit --has DSS legal guardian     DVT prophylaxis: [id number] Code Status: Full code  Family Communication: none today. DSS guardian aware of plan Level of care: Progressive Dispo:   The patient is from: [facility] Anticipated d/c is to: Group home with HHPT--pt ambulated and did a lap around the unit--ready for d/c Anticipated d/c date is:04/30/22 per Group home director       Disposition: Group home Diet recommendation:  Discharge Diet Orders (From admission, onward)     Start     Ordered   04/30/22 0000  Diet - low sodium heart healthy        04/30/22 1012           Cardiac diet DISCHARGE MEDICATION: Allergies as of 04/30/2022       Reactions   Iodinated Contrast Media Shortness Of Breath   Penicillins Anaphylaxis, Shortness Of Breath  Respiratory  Tolerated cefuroxime on 01/06/16   Strawberry Extract Anaphylaxis   Cefepime Itching   Empiric antibiotic, developed pruritis.    Erythromycin Itching   Sulfa Antibiotics Itching, Nausea And Vomiting, Nausea Only        Medication List     STOP taking these medications    digoxin 0.125 MG tablet Commonly known as: LANOXIN   Hydrocortisone Acetate 1 % Crea   predniSONE 10 MG  tablet Commonly known as: DELTASONE       TAKE these medications    acetaminophen 500 MG tablet Commonly known as: TYLENOL Take 500 mg by mouth every 6 (six) hours as needed for mild pain.   albuterol 108 (90 Base) MCG/ACT inhaler Commonly known as: VENTOLIN HFA Inhale 2 puffs into the lungs every 6 (six) hours as needed for wheezing.   albuterol (2.5 MG/3ML) 0.083% nebulizer solution Commonly known as: PROVENTIL Take 3 mLs (2.5 mg total) by nebulization every 6 (six) hours as needed for wheezing or shortness of breath.   amiodarone 400 MG tablet Commonly known as: PACERONE Take 1 tablet (400 mg total) by mouth daily. Start taking on: May 01, 2022   atorvastatin 40 MG tablet Commonly known as: LIPITOR Take 40 mg by mouth daily.   dapagliflozin propanediol 10 MG Tabs tablet Commonly known as: FARXIGA Take 1 tablet (10 mg total) by mouth daily. Start taking on: May 01, 2022   divalproex 500 MG 24 hr tablet Commonly known as: DEPAKOTE ER Take 1 tablet (500 mg total) by mouth 2 (two) times daily.   Eliquis 5 MG Tabs tablet Generic drug: apixaban TAKE 1 TABLET BY MOUTH TWICE A DAY   escitalopram 10 MG tablet Commonly known as: LEXAPRO Take 1 tablet (10 mg total) by mouth at bedtime.   ezetimibe 10 MG tablet Commonly known as: ZETIA Take 1 tablet (10 mg total) by mouth daily.   furosemide 20 MG tablet Commonly known as: LASIX Take 3 tablets (60 mg) by mouth once daily   hydrOXYzine 10 MG tablet Commonly known as: ATARAX Take 10 mg by mouth 2 (two) times daily.   levETIRAcetam 250 MG tablet Commonly known as: Keppra Take 1 tablet (250 mg total) by mouth 2 (two) times daily.   levothyroxine 25 MCG tablet Commonly known as: SYNTHROID Take 1 tablet (25 mcg total) by mouth daily.   losartan 25 MG tablet Commonly known as: COZAAR Take 0.5 tablets (12.5 mg total) by mouth daily. Start taking on: May 01, 2022   metoprolol succinate 25 MG 24 hr  tablet Commonly known as: TOPROL-XL Take 0.5 tablets (12.5 mg total) by mouth at bedtime.   multivitamin with minerals Tabs tablet Take 1 tablet by mouth daily. Start taking on: May 01, 2022   omeprazole 20 MG capsule Commonly known as: PRILOSEC Take 20 mg by mouth daily.   QUEtiapine 200 MG tablet Commonly known as: SEROQUEL Take 200 mg by mouth at bedtime.   tiotropium 18 MCG inhalation capsule Commonly known as: SPIRIVA Place 18 mcg into inhaler and inhale daily.   traMADol 50 MG tablet Commonly known as: ULTRAM Take 1 tablet (50 mg total) by mouth 2 (two) times daily as needed.        Follow-up Information     [facility]. Schedule an appointment as soon as possible for a visit in 1 week(s).   Specialty: Geriatric Medicine Contact information: [place] [address] [phone]  End, Harrell Gave, MD. Schedule an appointment as soon as possible for a visit in 1 week(s).   Specialty: Cardiology Why: hospital f/u Contact information: [place] [address] [phone]                Discharge Exam: Danley Danker Weights   04/28/22 [id number] 04/29/22 0500 04/30/22 0529  Weight: 83.5 kg 82.2 kg 79.7 kg   Overall stable  Condition at discharge: fair  The results of significant diagnostics from this hospitalization (including imaging, microbiology, ancillary and laboratory) are listed below for reference.   Imaging Studies: CARDIAC CATHETERIZATION  Result Date: 04/25/2022 Conclusions: Moderate-severe multivessel coronary artery disease, including 50% proximal LAD stenosis (mid/distal LAD are small with prominent first septal branch consistent with "double-barrel LAD), occlusion of large proximal D1 (distal vessel is supplied by patent LIMA graft), moderate diffuse LCx disease, occluded OM2 branch supplied by ectatic but patent SVG, and sequential proximal through distal RCA  disease of up to 80-90% that is not hemodynamically significant (iFR = 0.99). Widely patent LIMA-D1. Ectatic but patent SVG-OM2. Patent overlapping proximal through distal RCA stents with mild in-stent restenosis in the proximal segment. Moderately elevated left heart filling pressures (LVEDP 25-30 mmHg, PWCP 30 mmHg with prominent V-waves). Moderately-severely elevated right heart and pulmonary artery pressures (mean RA 15 mmHg, mean PAP 42 mmHg). Low normal to mildly reduced Fick cardiac output/index (Fick CO/CI, 4.9 L/min, 2.4 L/min/m^2). Recommendations: Continue medical therapy; no target for PCI.  I suspect elevated troponin represents supply-demand mismatch in the setting of chronic ischemic heart disease, acute on chronic HFrEF, and recent VT/VF with ICD shock. Aggressive diuresis as blood pressure and renal function allow. Escalate goal-directed medical therapy for HFrEF due to ischemic cardiomyopathy as blood pressure and renal function allow. Aggressive secondary prevention of coronary artery disease. Restart IV heparin 2 hours after TR band removal; transition back to apixaban as soon as tomorrow if there is no evidence of bleeding or vascular injury from catheterization. Findings and recommendations were discussed by telephone with the patient's legal guardian. Nelva Bush, MD [facility]  CT Head Wo Contrast  Result Date: 04/22/2022 CLINICAL DATA:  Altered mental status EXAM: CT HEAD WITHOUT CONTRAST TECHNIQUE: Contiguous axial images were obtained from the base of the skull through the vertex without intravenous contrast. RADIATION DOSE REDUCTION: This exam was performed according to the departmental dose-optimization program which includes automated exposure control, adjustment of the mA and/or kV according to patient size and/or use of iterative reconstruction technique. COMPARISON:  03/14/2022 FINDINGS: Brain: Mild atrophic changes and chronic white matter ischemic changes are seen.  There is diffuse encephalomalacia in the left parietooccipital and temporal lobes consistent with prior infarct. These are stable from the prior study. No acute hemorrhage, acute infarction or space-occupying mass lesion is seen. Vascular: No hyperdense vessel or unexpected calcification. Skull: Normal. Negative for fracture or focal lesion. Sinuses/Orbits: No acute finding. Other: None. IMPRESSION: Chronic left MCA infarct with encephalomalacia. No acute abnormality noted. Electronically Signed   By: Inez Catalina M.D.   On: 04/22/2022 02:05   DG Chest Port 1 View  Result Date: 04/22/2022 CLINICAL DATA:  Shortness of breath EXAM: PORTABLE CHEST 1 VIEW COMPARISON:  04/12/2022 FINDINGS: Cardiomegaly with mild perihilar edema. Mild left basilar opacity, likely atelectasis. No definite pleural effusions. No pneumothorax. Valve prosthesis. Left subclavian ICD. Postsurgical changes related to prior CABG. Median sternotomy. IMPRESSION: Cardiomegaly with mild perihilar edema. Left basilar opacity, likely atelectasis. Electronically Signed   By: Bertis Ruddy  [place] Pink M.D.   On: 04/22/2022 00:11   DG Chest 2 View  Result Date: 04/14/2022 CLINICAL DATA:  Shortness of breath. EXAM: CHEST - 2 VIEW COMPARISON:  Radiograph 03/22/2022 FINDINGS: Prior median sternotomy with prosthetic cardiac valve. Left-sided pacemaker remains in place. Stable cardiomegaly. Improvement in vascular congestion from prior exam. No focal airspace disease or pleural effusion. No pneumothorax. Surgical change of the left clavicle. No acute osseous findings. IMPRESSION: Stable cardiomegaly. Improved vascular congestion from prior exam. No new abnormality. Electronically Signed   By: Keith Rake M.D.   On: 04/14/2022 11:46    Microbiology: Results for orders placed or performed during the hospital encounter of 03/22/22  Culture, blood (routine x 2)     Status: None   Collection Time: 03/22/22 10:38 AM   Specimen: BLOOD  Result Value Ref  Range Status   Specimen Description BLOOD LEFT ANTECUBITAL  Final   Special Requests   Final    BOTTLES DRAWN AEROBIC AND ANAEROBIC Blood Culture results may not be optimal due to an excessive volume of blood received in culture bottles   Culture   Final    NO GROWTH 8 DAYS Performed at [facility], [address] [place], [place] [address]    Report Status 03/30/2022 FINAL  Final  SARS Coronavirus 2 by RT PCR (hospital order, performed in [place] hospital lab) *cepheid single result test* Anterior Nasal Swab     Status: Abnormal   Collection Time: 03/22/22  4:55 PM   Specimen: Anterior Nasal Swab  Result Value Ref Range Status   SARS Coronavirus 2 by RT PCR POSITIVE (A) NEGATIVE Final    Comment: (NOTE) SARS-CoV-2 target nucleic acids are DETECTED  SARS-CoV-2 RNA is generally detectable in upper respiratory specimens  during the acute phase of infection.  Positive results are indicative  of the presence of the identified virus, but do not rule out bacterial infection or co-infection with other pathogens not detected by the test.  Clinical correlation with patient history and  other diagnostic information is necessary to determine patient infection status.  The expected result is negative.  Fact Sheet for Patients:   https://www..info/   Fact Sheet for Healthcare Providers:   https://hall.com/    This test is not yet approved or cleared by the [place] FDA and  has been authorized for detection and/or diagnosis of SARS-CoV-2 by FDA under an Emergency Use Authorization (EUA).  This EUA will remain in effect (meaning this test can be used) for the duration of  the COVID-19 declaration under Section 564(b)(1)  of the Act, 21 U.S.C. section 360-bbb-3(b)(1), unless the authorization is terminated or revoked sooner.   Performed at [facility], [address] [place], [place] [address]   Culture, blood  (routine x 2)     Status: None   Collection Time: 03/23/22  4:58 AM   Specimen: BLOOD  Result Value Ref Range Status   Specimen Description BLOOD BLOOD RIGHT HAND  Final   Special Requests   Final    BOTTLES DRAWN AEROBIC AND ANAEROBIC Blood Culture adequate volume   Culture   Final    NO GROWTH 5 DAYS Performed at [facility], [address], [place], [place] [address]    Report Status 03/28/2022 FINAL  Final    Labs: CBC: Recent Labs  Lab 04/24/22 0413 04/25/22 0528 04/25/22 1408 04/25/22 1417 04/26/22 0021 04/27/22 0543 04/28/22 0611  WBC 8.1 6.2  --   --  11.2* 12.9*  10.7*  HGB 12.6* 12.9* 12.2* 12.9* 11.9* 11.7* 11.4*  HCT 39.3 39.5 36.0* 38.0* 35.9* 35.8* 35.3*  MCV 90.6 87.4  --   --  88.0 86.9 87.6  PLT 329 304  --   --  282 278 093   Basic Metabolic Panel: Recent Labs  Lab 04/25/22 0528 04/25/22 1408 04/25/22 1417 04/26/22 0021 04/27/22 0543 04/28/22 0611 04/29/22 1119  NA 135   < > 134* 138 138 138 136  K 4.5   < > 4.3 3.9 3.7 3.8 4.0  CL 101  --   --  101 98 100 99  CO2 28  --   --  28 31 31 29   GLUCOSE 165*  --   --  181* 103* 99 146*  BUN 29*  --   --  34* 37* 31* 26*  CREATININE 1.15  --   --  1.26* 1.29* 1.15 1.13  CALCIUM 9.1  --   --  9.2 8.7* 8.7* 8.7*  MG 1.9  --   --  2.1 2.2 2.2 2.1   < > = values in this interval not displayed.   Liver Function Tests: No results for input(s): "AST", "ALT", "ALKPHOS", "BILITOT", "PROT", "ALBUMIN" in the last 168 hours. CBG: No results for input(s): "GLUCAP" in the last 168 hours.  Discharge time spent: greater than 30 minutes.  Signed: Fritzi Mandes, MD [facility] 04/30/2022

## 2022-05-01 NOTE — Telephone Encounter (Signed)
Outreach made to Pt.  Spoke with caregiver. Advised transmission not needed, request for transmission was prior to recent hospitalization.  Caregiver indicates understanding.

## 2022-05-03 ENCOUNTER — Ambulatory Visit (INDEPENDENT_AMBULATORY_CARE_PROVIDER_SITE_OTHER): Payer: Medicaid Other

## 2022-05-03 DIAGNOSIS — I255 Ischemic cardiomyopathy: Secondary | ICD-10-CM | POA: Diagnosis not present

## 2022-05-03 LAB — CUP PACEART REMOTE DEVICE CHECK
Battery Remaining Longevity: 58 mo
Battery Voltage: 2.99 V
Brady Statistic AP VP Percent: 2.53 %
Brady Statistic AP VS Percent: 0.05 %
Brady Statistic AS VP Percent: 94.66 %
Brady Statistic AS VS Percent: 2.75 %
Brady Statistic RA Percent Paced: 2.57 %
Brady Statistic RV Percent Paced: 26.54 %
Date Time Interrogation Session: 20231027044227
HighPow Impedance: 74 Ohm
Implantable Lead Connection Status: 753985
Implantable Lead Connection Status: 753985
Implantable Lead Connection Status: 753985
Implantable Lead Implant Date: 20171201
Implantable Lead Implant Date: 20171201
Implantable Lead Implant Date: 20211029
Implantable Lead Location: 753858
Implantable Lead Location: 753859
Implantable Lead Location: 753860
Implantable Lead Model: 5076
Implantable Pulse Generator Implant Date: 20211029
Lead Channel Impedance Value: 189.525
Lead Channel Impedance Value: 201.488
Lead Channel Impedance Value: 212.8 Ohm
Lead Channel Impedance Value: 212.8 Ohm
Lead Channel Impedance Value: 228 Ohm
Lead Channel Impedance Value: 361 Ohm
Lead Channel Impedance Value: 399 Ohm
Lead Channel Impedance Value: 399 Ohm
Lead Channel Impedance Value: 456 Ohm
Lead Channel Impedance Value: 456 Ohm
Lead Channel Impedance Value: 646 Ohm
Lead Channel Impedance Value: 665 Ohm
Lead Channel Impedance Value: 665 Ohm
Lead Channel Impedance Value: 665 Ohm
Lead Channel Impedance Value: 703 Ohm
Lead Channel Impedance Value: 722 Ohm
Lead Channel Impedance Value: 760 Ohm
Lead Channel Impedance Value: 760 Ohm
Lead Channel Pacing Threshold Amplitude: 0.5 V
Lead Channel Pacing Threshold Amplitude: 1 V
Lead Channel Pacing Threshold Amplitude: 2.5 V
Lead Channel Pacing Threshold Pulse Width: 0.4 ms
Lead Channel Pacing Threshold Pulse Width: 0.4 ms
Lead Channel Pacing Threshold Pulse Width: 0.4 ms
Lead Channel Sensing Intrinsic Amplitude: 0.75 mV
Lead Channel Sensing Intrinsic Amplitude: 0.75 mV
Lead Channel Sensing Intrinsic Amplitude: 18 mV
Lead Channel Sensing Intrinsic Amplitude: 18 mV
Lead Channel Setting Pacing Amplitude: 1.5 V
Lead Channel Setting Pacing Amplitude: 1.5 V
Lead Channel Setting Pacing Amplitude: 2.5 V
Lead Channel Setting Pacing Pulse Width: 0.4 ms
Lead Channel Setting Pacing Pulse Width: 0.8 ms
Lead Channel Setting Sensing Sensitivity: 0.3 mV
Zone Setting Status: 755011
Zone Setting Status: 755011

## 2022-05-08 ENCOUNTER — Ambulatory Visit: Payer: Medicaid Other | Admitting: Family

## 2022-05-08 ENCOUNTER — Telehealth: Payer: Self-pay | Admitting: Family

## 2022-05-08 NOTE — Telephone Encounter (Signed)
Patient did not show for his [facility] appointment on 07/18/21. Will attempt to reschedule.   °

## 2022-05-09 NOTE — Progress Notes (Signed)
Remote ICD transmission.   

## 2022-05-15 NOTE — Progress Notes (Unsigned)
Patient ID: Carl [name], male    DOB: [birth year], 61 y.o.   MRN: [id number]  HPI  Carl [name] is a 61 y/o male with a history of HTN, CKD, stroke, COPD, NSTEMI, bipolar, depression, seizures, current tobacco use and chronic heart failure.   Echo report from 02/16/22 reviewed and showed an EF of 25-30% along with severe LAE and mild Carl. Echo report from 01/24/20 reviewed and showed an EF of 25-30% along with mildly elevated PA pressure, LAE and mild/moderate Carl. Echo report from 11/18/19 reviewed and showed an EF of 25-30%.  RHC/LHC done 04/25/22 and showed: Moderate-severe multivessel coronary artery disease, including 50% proximal LAD stenosis (mid/distal LAD are small with prominent first septal branch consistent with "double-barrel LAD), occlusion of large proximal D1 (distal vessel is supplied by patent LIMA graft), moderate diffuse LCx disease, occluded OM2 branch supplied by ectatic but patent SVG, and sequential proximal through distal RCA disease of up to 80-90% that is not hemodynamically significant (iFR = 0.99). Widely patent LIMA-D1. Ectatic but patent SVG-OM2. Patent overlapping proximal through distal RCA stents with mild in-stent restenosis in the proximal segment. Moderately elevated left heart filling pressures (LVEDP 25-30 mmHg, PWCP 30 mmHg with prominent V-waves). Moderately-severely elevated right heart and pulmonary artery pressures (mean RA 15 mmHg, mean PAP 42 mmHg). Low normal to mildly reduced Fick cardiac output/index (Fick CO/CI, 4.9 L/min, 2.4 L/min/m^2).  Admitted 04/22/22 due to acute onset of worsening dyspnea at rest with orthopnea and lower extremity edema. Cardiology consult obtained. Digoxin stopped. Continue amiodarone. Cath done. Discharged after 8 days. Admitted 03/22/22 due to SOB and chest pain due to HF/ COPD exacerbation. Found to be hypoxic and covid +. Started on IV Remdisivir and IV steroids. Cardiology consult obtained. Initially needed bipap but then  weaned off. Elevated troponin thought to be due to demand ischemia. Given IV lasix with transition to oral diuretics. Discharged after 10 days. Admitted 03/14/22 due to epigastric abdominal and left-sided chest pain due to COPD exacerbation. Given IV steroids, bronchodilators and IV lasix. Palliative care consult done. Elevated troponins thought to be due to demand ischemia. Discharged after 7 days.   He presents today for a follow-up visit with a chief complaint of minimal shortness of breath with moderate exertion. Describes this as chronic in nature although he feels like it's improving. He has associated fatigue along with this. Denies any difficulty sleeping, dizziness, abdominal distention, palpitations, pedal edema, chest pain, cough or weight gain.   Weight chart and BP record from group home reviewed.   Says that he's not adding salt to his food. Continues to smoke ~ 10 cigarettes daily.          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   Seizures (HCC)    childhood   Stroke (HCC)    Tobacco abuse    Past Surgical History:  Procedure Laterality Date   BIV UPGRADE N/A 05/05/2020   Procedure: BIV ICD UPGRADE;  Surgeon: Duke Salvia, MD;  Location: [facility] INVASIVE CV LAB;  Service: Cardiovascular;  Laterality: N/A;   CORONARY ARTERY BYPASS GRAFT     INTRAVASCULAR PRESSURE WIRE/FFR STUDY N/A 04/25/2022   Procedure: INTRAVASCULAR PRESSURE WIRE/FFR STUDY;  Surgeon: Yvonne Kendall, MD;  Location: [facility] INVASIVE CV LAB;  Service: Cardiovascular;  Laterality: N/A;   RIGHT/LEFT  HEART CATH AND CORONARY/GRAFT ANGIOGRAPHY N/A 04/25/2022   Procedure: RIGHT/LEFT HEART CATH AND CORONARY/GRAFT ANGIOGRAPHY;  Surgeon: Yvonne Kendall, MD;  Location: [facility] INVASIVE CV LAB;  Service: Cardiovascular;  Laterality: N/A;   Family History  Problem Relation Age of Onset   Cancer Mother        "femal cancer"   Prostate cancer Father    Social History   Tobacco Use   Smoking status: Every Day    Packs/day: 0.25    Years: 0.00    Total pack years: 0.00    Types: Cigarettes   Smokeless tobacco: Never  Substance Use Topics   Alcohol use: Not Currently   Allergies  Allergen Reactions   Iodinated Contrast Media Shortness Of Breath   Penicillins Anaphylaxis and Shortness Of Breath    Respiratory  Tolerated cefuroxime on 01/06/16   Strawberry Extract Anaphylaxis   Cefepime Itching    Empiric antibiotic, developed pruritis.    Erythromycin Itching   Sulfa Antibiotics Itching, Nausea And Vomiting and Nausea Only   Prior to Admission medications   Medication Sig Start Date End Date Taking? Authorizing Provider  acetaminophen (TYLENOL) 500 MG tablet Take 500 mg by mouth every 6 (six) hours as needed for mild pain.   Yes [provider]  albuterol (PROVENTIL) (2.5 MG/3ML) 0.083% nebulizer solution Take 3 mLs (2.5 mg total) by nebulization every 6 (six) hours as needed for wheezing or shortness of breath. 03/21/22  Yes Lurene Shadow, MD  albuterol (VENTOLIN HFA) 108 (90 Base) MCG/ACT inhaler Inhale 2 puffs into the lungs every 6 (six) hours as needed for wheezing. 05/24/19 05/16/22 Yes Clapacs, Jackquline Denmark, MD  amiodarone (PACERONE) 400 MG tablet Take 1 tablet (400 mg total) by mouth daily. 05/01/22  Yes Enedina Finner, MD  apixaban (ELIQUIS) 5 MG TABS tablet TAKE 1 TABLET BY MOUTH TWICE A DAY 01/18/21  Yes Duke Salvia, MD  dapagliflozin propanediol (FARXIGA) 10 MG TABS tablet Take 1 tablet (10 mg total) by mouth daily. 05/01/22  Yes Enedina Finner, MD  digoxin (LANOXIN) 0.125 MG  tablet Take 0.125 mg by mouth daily.   Yes [provider]  divalproex (DEPAKOTE ER) 500 MG 24 hr tablet Take 1 tablet (500 mg total) by mouth 2 (two) times daily. 05/24/19  Yes Clapacs, Jackquline Denmark, MD  escitalopram (LEXAPRO) 10 MG tablet Take 1 tablet (10 mg total) by mouth at bedtime. 05/24/19  Yes Clapacs, Jackquline Denmark, MD  ezetimibe (ZETIA) 10 MG tablet Take 1 tablet (10 mg total) by mouth daily. 04/01/22  Yes Hollice Espy, MD  furosemide (LASIX) 20 MG tablet Take 3 tablets (60 mg) by mouth once daily 04/12/22  Yes Furth, Cadence H, PA-C  Hydrocortisone Acetate 1 % CREA Apply TID 05/15/22  Yes [provider]  hydrOXYzine (ATARAX) 10 MG tablet Take 10 mg by mouth 2 (two) times daily. 12/24/21  Yes [provider]  levETIRAcetam (KEPPRA) 250 MG tablet Take 1 tablet (250 mg total) by mouth 2 (two) times daily. 03/24/20  Yes Minna Antis, MD  levothyroxine (SYNTHROID) 25 MCG tablet Take 1 tablet (25 mcg total) by mouth daily. 05/24/19  Yes Clapacs, Jackquline Denmark, MD  losartan (COZAAR) 25 MG tablet Take 0.5 tablets (12.5  mg total) by mouth daily. 05/01/22  Yes Enedina Finner, MD  metoprolol succinate (TOPROL-XL) 25 MG 24 hr tablet Take 0.5 tablets (12.5 mg total) by mouth at bedtime. 04/30/22  Yes Enedina Finner, MD  Multiple Vitamin (MULTIVITAMIN WITH MINERALS) TABS tablet Take 1 tablet by mouth daily. 05/01/22  Yes Enedina Finner, MD  omeprazole (PRILOSEC) 20 MG capsule Take 20 mg by mouth daily.   Yes [provider]  predniSONE (DELTASONE) 10 MG tablet Take 10 mg by mouth daily with breakfast.   Yes [provider]  QUEtiapine (SEROQUEL) 200 MG tablet Take 200 mg by mouth at bedtime.   Yes [provider]  rosuvastatin (CRESTOR) 10 MG tablet Take 1 tablet (10 mg total) by mouth daily. 05/16/22  Yes Donnica Jarnagin, Inetta Fermo A, FNP  tiotropium (SPIRIVA) 18 MCG inhalation capsule Place 18 mcg into inhaler and inhale daily.   Yes [provider]  traMADol (ULTRAM) 50  MG tablet Take 1 tablet (50 mg total) by mouth 2 (two) times daily as needed. 03/31/22  Yes Hollice Espy, MD    Review of Systems  Constitutional:  Positive for fatigue. Negative for appetite change.  HENT:  Negative for congestion, postnasal drip and sore throat.   Eyes: Negative.   Respiratory:  Positive for shortness of breath (improving). Negative for cough.   Cardiovascular:  Negative for chest pain, palpitations and leg swelling.  Gastrointestinal:  Negative for abdominal distention and abdominal pain.  Endocrine: Negative.   Genitourinary: Negative.   Musculoskeletal:  Negative for arthralgias and gait problem.  Skin: Negative.   Allergic/Immunologic: Negative.   Neurological:  Negative for dizziness and light-headedness.  Hematological:  Negative for adenopathy. Does not bruise/bleed easily.  Psychiatric/Behavioral:  Negative for dysphoric mood and sleep disturbance (sleeping on 2 pillows). The patient is not nervous/anxious.    Vitals:   05/16/22 0848  BP: 107/69  Pulse: 78  Resp: 18  SpO2: 98%  Weight: 183 lb 8 oz (83.2 kg)   Wt Readings from Last 3 Encounters:  05/16/22 183 lb 8 oz (83.2 kg)  04/30/22 175 lb 11.3 oz (79.7 kg)  04/12/22 196 lb (88.9 kg)   Lab Results  Component Value Date   CREATININE 1.13 04/29/2022   CREATININE 1.15 04/28/2022   CREATININE 1.29 (H) 04/27/2022   Physical Exam Vitals and nursing note reviewed. Exam conducted with a chaperone present (caregiver from facility).  Constitutional:      Appearance: Normal appearance.  HENT:     Head: Normocephalic and atraumatic.  Cardiovascular:     Rate and Rhythm: Normal rate and regular rhythm.  Pulmonary:     Effort: Pulmonary effort is normal. No respiratory distress.     Breath sounds: No wheezing or rales.  Abdominal:     Palpations: Abdomen is soft.     Tenderness: There is no abdominal tenderness.  Musculoskeletal:        General: No tenderness.     Cervical back: Normal range  of motion and neck supple.     Right lower leg: Edema (trace pitting) present.     Left lower leg: Edema (trace pitting) present.  Skin:    General: Skin is warm and dry.  Neurological:     General: No focal deficit present.     Mental Status: He is alert and oriented to person, place, and time.  Psychiatric:        Mood and Affect: Mood is anxious.        Behavior: Behavior  normal.    Assessment & Plan:  1: Chronic heart failure with reduced ejection fraction- - NYHA class II - euvolemic today - weighing daily; reminded to call for an overnight weight gain of >2 pounds or a weekly weight gain of >5 pounds - weight down 4 pounds from last visit here 6 weeks ago - saw cardiology Fransico Michael) 04/12/22 - on GDMT of carvedilol & lisinopril  - doubt that BP could tolerate changing lisinopril to entresto or adding other GDMT - saw EP Graciela Husbands) 08/21/21 - BNP 04/21/22 was 878.6 - PharmD reconciled medications  2: HTN- - BP looks good (107/69) - [facility] does his primary care - BMP 04/29/22 reviewed and showed sodium 136, potassium 4.0, creatinine 1.13 and GFR >60  3: Tobacco use- - does smoke 10 cigarettes daily - asks about nicotine patch and explained that that would need to come from PCP   4: CAD- - will start rosuvastatin 10mg  daily   Facility medication list reviewed.   Return in 6 months, sooner if needed

## 2022-05-16 ENCOUNTER — Encounter: Payer: Self-pay | Admitting: Family

## 2022-05-16 ENCOUNTER — Ambulatory Visit: Payer: Medicaid Other | Attending: Family | Admitting: Family

## 2022-05-16 ENCOUNTER — Encounter: Payer: Self-pay | Admitting: Pharmacist

## 2022-05-16 VITALS — BP 107/69 | HR 78 | Resp 18 | Wt 183.5 lb

## 2022-05-16 DIAGNOSIS — N189 Chronic kidney disease, unspecified: Secondary | ICD-10-CM | POA: Diagnosis not present

## 2022-05-16 DIAGNOSIS — I5022 Chronic systolic (congestive) heart failure: Secondary | ICD-10-CM | POA: Diagnosis not present

## 2022-05-16 DIAGNOSIS — R569 Unspecified convulsions: Secondary | ICD-10-CM | POA: Insufficient documentation

## 2022-05-16 DIAGNOSIS — Z720 Tobacco use: Secondary | ICD-10-CM

## 2022-05-16 DIAGNOSIS — Z79899 Other long term (current) drug therapy: Secondary | ICD-10-CM | POA: Insufficient documentation

## 2022-05-16 DIAGNOSIS — F17210 Nicotine dependence, cigarettes, uncomplicated: Secondary | ICD-10-CM | POA: Diagnosis not present

## 2022-05-16 DIAGNOSIS — J449 Chronic obstructive pulmonary disease, unspecified: Secondary | ICD-10-CM | POA: Insufficient documentation

## 2022-05-16 DIAGNOSIS — I252 Old myocardial infarction: Secondary | ICD-10-CM | POA: Diagnosis not present

## 2022-05-16 DIAGNOSIS — Z8673 Personal history of transient ischemic attack (TIA), and cerebral infarction without residual deficits: Secondary | ICD-10-CM | POA: Insufficient documentation

## 2022-05-16 DIAGNOSIS — F319 Bipolar disorder, unspecified: Secondary | ICD-10-CM | POA: Insufficient documentation

## 2022-05-16 DIAGNOSIS — I2510 Atherosclerotic heart disease of native coronary artery without angina pectoris: Secondary | ICD-10-CM | POA: Insufficient documentation

## 2022-05-16 DIAGNOSIS — I10 Essential (primary) hypertension: Secondary | ICD-10-CM | POA: Diagnosis not present

## 2022-05-16 DIAGNOSIS — F17200 Nicotine dependence, unspecified, uncomplicated: Secondary | ICD-10-CM | POA: Diagnosis not present

## 2022-05-16 DIAGNOSIS — I130 Hypertensive heart and chronic kidney disease with heart failure and stage 1 through stage 4 chronic kidney disease, or unspecified chronic kidney disease: Secondary | ICD-10-CM | POA: Diagnosis present

## 2022-05-16 MED ORDER — ROSUVASTATIN CALCIUM 10 MG PO TABS: 10.0000 mg | ORAL_TABLET | Freq: Every day | ORAL | 6 refills | Status: DC

## 2022-05-16 NOTE — Patient Instructions (Addendum)
Medication Changes:  START rosuvastatin 10 mg daily  Lab Work:  None  Testing/Procedures:  None  Referrals:  None  Special Instructions // Education:  Continue weighing daily and call for an overnight weight gain of 3 pounds or more or a weekly weight gain of more than 5 pounds. Do the following things EVERYDAY: Weigh yourself in the morning before breakfast. Write it down and keep it in a log. Take your medicines as prescribed Eat low salt foods--Limit salt (sodium) to 2000 mg per day.  Stay as active as you can everyday Limit all fluids for the day to less than 2 liters   Follow-Up in:  6 months    If you have any questions or concerns before your next appointment please send [place] a message through [place] or call our office at [phone]

## 2022-05-20 ENCOUNTER — Ambulatory Visit: Payer: Medicaid Other | Admitting: Medical

## 2022-05-20 NOTE — Progress Notes (Deleted)
Cardiology Office Note:    Date:  05/20/2022   ID:  Carl [name], DOB Jul 01, 1961, MRN [id number]  PCP:  [facility] Cardiologist:  Lorine Bears, MD  [facility] Electrophysiologist:  [name], MD   Referring MD: Almetta Lovely, [facility]   Chief Complaint: 1 month follow-up  History of Present Illness:    Carl [name] is a 61 y.o. male with a hx of f CAD s/p CABG in 2017, ICM, EF30-35%, s/p ICD 2017, COPD, paroxysmal Afib on Eliquis, CVA, bipolar disorder, current smoker who presented for hospital follow-up.    The patient was hospitalized in May 2021 with respiratory distress in the setting of COPD and heart failure. Echo showed LVEF 25-30%. He underwent Myoview Lexiscan which showed evidence of prior infarct with no ischemia. He was hospitalized in July 2021 for sepsis secondary to PNA. He underwent BiV ICD upgrade by Dr. Graciela Husbands in October 2021. Echo in February 2022 showed an EF of 30-35% with mild to mod MR.    He was hospitalized in August 2023 with shortness of breath and hypoxiz, brought via EMS. BNP 888 and troponin was 68. He was treated with IV heparin and IV lasix. Echo showed LVEF 25-30%, elevated LVEDP, severely dilated LA, mild MR.    The patient was discharged from [facility] to [facility] 03/06/22 for suicidal and homicidal ideation.   He was readmitted early September for COPD exacerbation. He was discharged 03/21/22.   The patient came back to the hospital 03/22/22 for shortness of breath and admitted with elevated troponin/NSTEMI, acute CHF, COPD exacerbation, COVID-19 virus, acute respiratory failure, hypotension, acute renal failure, paroxysmal Afib. He was treated with IV heparin for 48 hours. BB was restarted and digoxin was added for afib. Lisinopril was held for soft blood pressures. He was treated with IV lasix and diuresed 8.8 L. Midodrine and digoxin were stopped at discharge.  He was admitted again in October with  acute respiratory failure, VT, Acute heart failure, Hypokalemia, paroxysmal Afib. On 10/16 there was documented device firing. Tele showed frequent short stretches of Vtach. Digoxin was discontinued and he was started on amiodarone. Echo showed LVEF 25-30%. R and L heart cath showed mod to severe multivessel CAD, mod to severe pulmonary artery pressures, and patent grafts. He was diuresed and sent home on lasix 60mg  daily.   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   Seizures (HCC)    childhood   Stroke (HCC)    Tobacco abuse     Past Surgical History:  Procedure Laterality Date   BIV UPGRADE N/A 05/05/2020   Procedure: BIV ICD UPGRADE;  Surgeon: 05/07/2020, MD;  Location: [facility] INVASIVE CV LAB;  Service: Cardiovascular;  Laterality: N/A;   CORONARY ARTERY BYPASS GRAFT     INTRAVASCULAR PRESSURE WIRE/FFR STUDY N/A 04/25/2022   Procedure: INTRAVASCULAR PRESSURE WIRE/FFR STUDY;  Surgeon: 04/27/2022, MD;  Location: [facility] INVASIVE CV LAB;  Service: Cardiovascular;  Laterality: N/A;   RIGHT/LEFT HEART CATH AND  CORONARY/GRAFT ANGIOGRAPHY N/A 04/25/2022   Procedure: RIGHT/LEFT HEART CATH AND CORONARY/GRAFT ANGIOGRAPHY;  Surgeon: Nelva Bush, MD;  Location: [facility] CV LAB;  Service: Cardiovascular;  Laterality: N/A;    Current Medications: No outpatient medications have been marked as taking for the 05/20/22 encounter (Appointment) with Kathlen Mody, Oretha Weismann H, PA-C.     Allergies:   Iodinated contrast media, Penicillins, Strawberry extract, Cefepime,  Erythromycin, and Sulfa antibiotics   Social History   Socioeconomic History   Marital status: Widowed    Spouse name: Not on file   Number of children: Not on file   Years of education: Not on file   Highest education level: Not on file  Occupational History   Not on file  Tobacco Use   Smoking status: Every Day    Packs/day: 0.25    Years: 0.00    Total pack years: 0.00    Types: Cigarettes   Smokeless tobacco: Never  Vaping Use   Vaping Use: Never used  Substance and Sexual Activity   Alcohol use: Not Currently   Drug use: Never   Sexual activity: Not Currently    Birth control/protection: Abstinence  Other Topics Concern   Not on file  Social History Narrative   Not on file   Social Determinants of Health   Financial Resource Strain: Not on file  Food Insecurity: No Food Insecurity (04/22/2022)   Hunger Vital Sign    Worried About Running Out of Food in the Last Year: Never true    Ran Out of Food in the Last Year: Never true  Transportation Needs: No Transportation Needs (04/22/2022)   PRAPARE - Hydrologist (Medical): No    Lack of Transportation (Non-Medical): No  Physical Activity: Not on file  Stress: Not on file  Social Connections: Not on file     Family History: The patient's ***family history includes Cancer in his mother; Prostate cancer in his father.  ROS:   Please see the history of present illness.    *** All other systems reviewed and are negative.  EKGs/Labs/Other Studies Reviewed:    The following studies were reviewed today: ***  EKG:  EKG is *** ordered today.  The ekg ordered today demonstrates ***  Recent Labs: 04/21/2022: ALT 13; B Natriuretic Peptide 878.6 04/22/2022: TSH 4.961 04/28/2022: Hemoglobin 11.4; Platelets 239 04/29/2022: BUN 26; Creatinine, Ser 1.13; Magnesium 2.1; Potassium 4.0; Sodium 136  Recent Lipid Panel    Component Value Date/Time   CHOL 132 03/28/2022 0728   TRIG 107  03/28/2022 0728   HDL 44 03/28/2022 0728   CHOLHDL 3.0 03/28/2022 0728   VLDL 21 03/28/2022 0728   LDLCALC 67 03/28/2022 0728     Risk Assessment/Calculations:   {Does this patient have ATRIAL FIBRILLATION?:[phone]}   Physical Exam:    VS:  There were no vitals taken for this visit.    Wt Readings from Last 3 Encounters:  05/16/22 183 lb 8 oz (83.2 kg)  04/30/22 175 lb 11.3 oz (79.7 kg)  04/12/22 196 lb (88.9 kg)     GEN: *** Well nourished, well developed in no acute distress HEENT: Normal NECK: No JVD; No carotid bruits LYMPHATICS: No lymphadenopathy CARDIAC: ***RRR, no murmurs, rubs, gallops RESPIRATORY:  Clear to auscultation without rales, wheezing or rhonchi  ABDOMEN: Soft, non-tender, non-distended MUSCULOSKELETAL:  No edema; No deformity  SKIN: Warm and dry NEUROLOGIC:  Alert and oriented x 3 PSYCHIATRIC:  Normal affect   ASSESSMENT:    No  diagnosis found. PLAN:    In order of problems listed above:  ***  Disposition: Follow up {follow up:15908} with ***   Shared Decision Making/Informed Consent   {Are you ordering a CV Procedure (e.g. stress test, cath, DCCV, TEE, etc)?   Press F2        :[id number]    Signed, Jatavian Calica Ninfa Meeker, PA-C  05/20/2022 7:54 AM    [facility]

## 2022-05-22 ENCOUNTER — Emergency Department: Payer: Medicaid Other

## 2022-05-22 ENCOUNTER — Inpatient Hospital Stay
Admission: EM | Admit: 2022-05-22 | Discharge: 2022-05-25 | DRG: 308 | Disposition: A | Discharge status: Home / Self Care | Payer: Medicaid Other | Source: Skilled Nursing Facility | Attending: Internal Medicine | Admitting: Internal Medicine

## 2022-05-22 ENCOUNTER — Other Ambulatory Visit: Payer: Self-pay

## 2022-05-22 ENCOUNTER — Telehealth: Payer: Self-pay

## 2022-05-22 DIAGNOSIS — Z87898 Personal history of other specified conditions: Secondary | ICD-10-CM

## 2022-05-22 DIAGNOSIS — I255 Ischemic cardiomyopathy: Secondary | ICD-10-CM | POA: Diagnosis present

## 2022-05-22 DIAGNOSIS — Z95810 Presence of automatic (implantable) cardiac defibrillator: Secondary | ICD-10-CM

## 2022-05-22 DIAGNOSIS — Z880 Allergy status to penicillin: Secondary | ICD-10-CM

## 2022-05-22 DIAGNOSIS — E663 Overweight: Secondary | ICD-10-CM | POA: Diagnosis present

## 2022-05-22 DIAGNOSIS — E785 Hyperlipidemia, unspecified: Secondary | ICD-10-CM | POA: Diagnosis present

## 2022-05-22 DIAGNOSIS — I959 Hypotension, unspecified: Secondary | ICD-10-CM | POA: Diagnosis present

## 2022-05-22 DIAGNOSIS — I5042 Chronic combined systolic (congestive) and diastolic (congestive) heart failure: Secondary | ICD-10-CM | POA: Diagnosis present

## 2022-05-22 DIAGNOSIS — E039 Hypothyroidism, unspecified: Secondary | ICD-10-CM | POA: Diagnosis present

## 2022-05-22 DIAGNOSIS — J449 Chronic obstructive pulmonary disease, unspecified: Secondary | ICD-10-CM | POA: Diagnosis present

## 2022-05-22 DIAGNOSIS — I2489 Other forms of acute ischemic heart disease: Secondary | ICD-10-CM | POA: Diagnosis present

## 2022-05-22 DIAGNOSIS — F17210 Nicotine dependence, cigarettes, uncomplicated: Secondary | ICD-10-CM | POA: Diagnosis present

## 2022-05-22 DIAGNOSIS — K219 Gastro-esophageal reflux disease without esophagitis: Secondary | ICD-10-CM | POA: Diagnosis present

## 2022-05-22 DIAGNOSIS — N183 Chronic kidney disease, stage 3 unspecified: Secondary | ICD-10-CM | POA: Diagnosis present

## 2022-05-22 DIAGNOSIS — Z8042 Family history of malignant neoplasm of prostate: Secondary | ICD-10-CM

## 2022-05-22 DIAGNOSIS — Z91041 Radiographic dye allergy status: Secondary | ICD-10-CM

## 2022-05-22 DIAGNOSIS — F319 Bipolar disorder, unspecified: Secondary | ICD-10-CM | POA: Diagnosis present

## 2022-05-22 DIAGNOSIS — Z79899 Other long term (current) drug therapy: Secondary | ICD-10-CM

## 2022-05-22 DIAGNOSIS — J320 Chronic maxillary sinusitis: Secondary | ICD-10-CM | POA: Diagnosis present

## 2022-05-22 DIAGNOSIS — R569 Unspecified convulsions: Secondary | ICD-10-CM | POA: Diagnosis present

## 2022-05-22 DIAGNOSIS — I5020 Unspecified systolic (congestive) heart failure: Secondary | ICD-10-CM

## 2022-05-22 DIAGNOSIS — G9389 Other specified disorders of brain: Secondary | ICD-10-CM | POA: Diagnosis present

## 2022-05-22 DIAGNOSIS — E1122 Type 2 diabetes mellitus with diabetic chronic kidney disease: Secondary | ICD-10-CM | POA: Diagnosis present

## 2022-05-22 DIAGNOSIS — Z4502 Encounter for adjustment and management of automatic implantable cardiac defibrillator: Secondary | ICD-10-CM | POA: Diagnosis not present

## 2022-05-22 DIAGNOSIS — I5043 Acute on chronic combined systolic (congestive) and diastolic (congestive) heart failure: Secondary | ICD-10-CM | POA: Diagnosis present

## 2022-05-22 DIAGNOSIS — I2510 Atherosclerotic heart disease of native coronary artery without angina pectoris: Secondary | ICD-10-CM | POA: Diagnosis present

## 2022-05-22 DIAGNOSIS — I252 Old myocardial infarction: Secondary | ICD-10-CM

## 2022-05-22 DIAGNOSIS — I130 Hypertensive heart and chronic kidney disease with heart failure and stage 1 through stage 4 chronic kidney disease, or unspecified chronic kidney disease: Secondary | ICD-10-CM | POA: Diagnosis present

## 2022-05-22 DIAGNOSIS — W050XXA Fall from non-moving wheelchair, initial encounter: Secondary | ICD-10-CM | POA: Diagnosis present

## 2022-05-22 DIAGNOSIS — E876 Hypokalemia: Secondary | ICD-10-CM | POA: Diagnosis present

## 2022-05-22 DIAGNOSIS — Z888 Allergy status to other drugs, medicaments and biological substances status: Secondary | ICD-10-CM

## 2022-05-22 DIAGNOSIS — Z8616 Personal history of COVID-19: Secondary | ICD-10-CM

## 2022-05-22 DIAGNOSIS — I472 Ventricular tachycardia, unspecified: Principal | ICD-10-CM | POA: Diagnosis present

## 2022-05-22 DIAGNOSIS — I480 Paroxysmal atrial fibrillation: Secondary | ICD-10-CM | POA: Diagnosis present

## 2022-05-22 DIAGNOSIS — Z882 Allergy status to sulfonamides status: Secondary | ICD-10-CM

## 2022-05-22 DIAGNOSIS — I4901 Ventricular fibrillation: Secondary | ICD-10-CM | POA: Diagnosis present

## 2022-05-22 DIAGNOSIS — Z7901 Long term (current) use of anticoagulants: Secondary | ICD-10-CM

## 2022-05-22 DIAGNOSIS — Z951 Presence of aortocoronary bypass graft: Secondary | ICD-10-CM

## 2022-05-22 DIAGNOSIS — Z955 Presence of coronary angioplasty implant and graft: Secondary | ICD-10-CM

## 2022-05-22 DIAGNOSIS — I69398 Other sequelae of cerebral infarction: Secondary | ICD-10-CM

## 2022-05-22 HISTORY — DX: Chronic systolic (congestive) heart failure: I50.22

## 2022-05-22 HISTORY — DX: Atherosclerotic heart disease of native coronary artery without angina pectoris: I25.10

## 2022-05-22 HISTORY — DX: Ischemic cardiomyopathy: I25.5

## 2022-05-22 HISTORY — DX: Chronic kidney disease, stage 3 unspecified: N18.30

## 2022-05-22 LAB — CBC
HCT: 38.6 % — ABNORMAL LOW (ref 39.0–52.0)
Hemoglobin: 12.6 g/dL — ABNORMAL LOW (ref 13.0–17.0)
MCH: 28.6 pg (ref 26.0–34.0)
MCHC: 32.6 g/dL (ref 30.0–36.0)
MCV: 87.5 fL (ref 80.0–100.0)
Platelets: 229 10*3/uL (ref 150–400)
RBC: 4.41 MIL/uL (ref 4.22–5.81)
RDW: 17.4 % — ABNORMAL HIGH (ref 11.5–15.5)
WBC: 8.2 10*3/uL (ref 4.0–10.5)
nRBC: 0 % (ref 0.0–0.2)

## 2022-05-22 LAB — MAGNESIUM: Magnesium: 1.9 mg/dL (ref 1.7–2.4)

## 2022-05-22 LAB — BASIC METABOLIC PANEL
Anion gap: 10 (ref 5–15)
BUN: 12 mg/dL (ref 8–23)
CO2: 26 mmol/L (ref 22–32)
Calcium: 8.7 mg/dL — ABNORMAL LOW (ref 8.9–10.3)
Chloride: 100 mmol/L (ref 98–111)
Creatinine, Ser: 1.37 mg/dL — ABNORMAL HIGH (ref 0.61–1.24)
GFR, Estimated: 58 mL/min — ABNORMAL LOW (ref >60)
Glucose, Bld: 117 mg/dL — ABNORMAL HIGH (ref 70–99)
Potassium: 2.7 mmol/L — CL (ref 3.5–5.1)
Sodium: 136 mmol/L (ref 135–145)

## 2022-05-22 LAB — TROPONIN I (HIGH SENSITIVITY): Troponin I (High Sensitivity): 78 ng/L — ABNORMAL HIGH (ref <18)

## 2022-05-22 MED ORDER — POTASSIUM CHLORIDE 10 MEQ/100ML IV SOLN
10.0000 meq | INTRAVENOUS | Status: AC
  Administered 2022-05-22 (×3): 10 meq via INTRAVENOUS
  Filled 2022-05-22 (×3): qty 100

## 2022-05-22 MED ORDER — MAGNESIUM SULFATE 2 GM/50ML IV SOLN
2.0000 g | Freq: Once | INTRAVENOUS | Status: AC
  Administered 2022-05-22: 2 g via INTRAVENOUS
  Filled 2022-05-22: qty 50

## 2022-05-22 MED ORDER — POTASSIUM CHLORIDE CRYS ER 20 MEQ PO TBCR
40.0000 meq | EXTENDED_RELEASE_TABLET | Freq: Once | ORAL | Status: AC
  Administered 2022-05-22: 40 meq via ORAL
  Filled 2022-05-22: qty 2

## 2022-05-22 NOTE — ED Triage Notes (Signed)
Pt to ED" from [place] [facility] Staff called [facility] because pacemaker Building services engineer) company called and said "they saw something" and that pt was VF, V tach this AM and that he was shocked.  Pt endorses falling to floor this AM but recalls no pain. Denies hitting head. But has a headache since falling.   Pt has no complaints, no CP SOB or dizziness.  Pt has hx stroke. Pt unsure if takes blood thinners.  EDP at bedside

## 2022-05-22 NOTE — Telephone Encounter (Signed)
CV Remote Solution Alert Received.   Device alert for sustained VT/VF with successful HV therapy. Event occurred 11/15 @ 09:43, EGM shows sustained VT/VF, HV therapy delivered 35J, converting to regular AS/BiV pace.  2 fast NSVT recorded prior. Optivol crossed threshold 10/5 and is ongoing. Route to triage.  Spoke to patient and caregiver. Patient reports he had chest pain prior to ICD shock and had "seizure" like activity. Patient was unaware of ICD shock. Compliant with meds on file.  Reviewed with Dr. Lalla Brothers, verbal orders obtained for patient to go to ER Stat due to VF event with symptoms and concerning morphology on EGM's. Spoke to patients caregiver and states he will call 911 and have patient to go ER now.

## 2022-05-22 NOTE — Progress Notes (Signed)
Patient ID: Carl [name], male   DOB: 11/15/1960, 61 y.o.   MRN: [id number] [facility] - PHARMACIST COUNSELING NOTE  Guideline-Directed Medical Therapy/Evidence Based Medicine  ACE/ARB/ARNI: Losartan 12.5 mg daily Beta Blocker: Metoprolol succinate 12.5 mg daily Aldosterone Antagonist:  none Diuretic: Furosemide 60 mg daily SGLT2i: Dapagliflozin 10 mg daily  Adherence Assessment  Do you ever forget to take your medication? [] Yes [x] No  Do you ever skip doses due to side effects? [] Yes [x] No  Do you have trouble affording your medicines? [] Yes [x] No  Are you ever unable to pick up your medication due to transportation difficulties? [] Yes [x] No  Do you ever stop taking your medications because you don't believe they are helping? [] Yes [x] No  Do you check your weight daily? [x] Yes [] No   Adherence strategy: none  Barriers to obtaining medications: none  Vital signs: HR 78, BP 107/69, weight (pounds) 183 lbs ECHO: Date 03/19/22, EF of 25-30% along with severe LAE and mild MR      Latest Ref Rng & Units 04/29/2022   11:19 AM 04/28/2022    6:11 AM 04/27/2022    5:43 AM  BMP  Glucose 70 - 99 mg/dL  99    BUN 8 - 23 mg/dL 26  31  37   Creatinine 0.61 - 1.24 mg/dL     Sodium 135 - 145 mmol/L 136  138  138   Potassium 3.5 - 5.1 mmol/L 4.0  3.8  3.7   Chloride 98 - 111 mmol/L 99  100  98   CO2 22 - 32 mmol/L 29  31  31    Calcium 8.9 - 10.3 mg/dL 8.7  8.7  8.7     Past Medical History:  Diagnosis Date   Acute pulmonary edema (HCC) 2017   Acute respiratory failure with hypoxia (HCC) 2017   Bipolar 1 disorder (HCC)    CHF (congestive heart failure) (HCC)    Chronic kidney disease    COPD (chronic obstructive pulmonary disease) (HCC)    Depression    Hypertension    Myocardial infarction [facility])    NSTEMI (non-ST elevated myocardial infarction) (HCC)    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 PMH includes HTN, CKD, stroke, COPD, NSTEMI, bipolar, depression, seizures, current tobacco use and chronic heart failure. Last acute care admission was 04/22/2022 for worsening dyspnea and lower extremity edema.  Medication reconciliation completed. Further GDMT titration limited due to soft BP. Noted hx of ASCVD with hx of atorvastatin use int he past. Statin stopped for unknown reason and patient unable to recollect ay ADR to statins.   PLAN  Add rosuvastatin 10mg  daily to current therapy No change in GDMT therapy for HF Follow up as recommended by provider   Time spent: 15 minutes  Baxter Gonzalez Rodriguez-Guzman PharmD, BCPS 05/22/2022 3:01 PM    Current Outpatient Medications:    acetaminophen (TYLENOL) 500 MG tablet, Take 500 mg by mouth every 6 (six) hours as needed for mild pain., Disp: , Rfl:    albuterol (PROVENTIL) (2.5 MG/3ML) 0.083% nebulizer solution, Take 3 mLs (2.5 mg total) by nebulization every 6 (six) hours as needed for wheezing or shortness of breath., Disp: 360 mL, Rfl: 0   albuterol (VENTOLIN HFA) 108 (  90 Base) MCG/ACT inhaler, Inhale 2 puffs into the lungs every 6 (six) hours as needed for wheezing., Disp: 8 g, Rfl: 1   amiodarone (PACERONE) 400 MG tablet, Take 1 tablet (400 mg total) by mouth daily., Disp: 30 tablet, Rfl: 1   apixaban (ELIQUIS) 5 MG TABS tablet, TAKE 1 TABLET BY MOUTH TWICE A DAY, Disp: 60 tablet, Rfl: 6   dapagliflozin propanediol (FARXIGA) 10 MG TABS tablet, Take 1 tablet (10 mg total) by mouth daily., Disp: 30 tablet, Rfl: 1   digoxin (LANOXIN) 0.125 MG tablet, Take 0.125 mg by mouth daily., Disp: , Rfl:    divalproex (DEPAKOTE ER) 500 MG 24 hr tablet, Take 1 tablet (500 mg total) by mouth 2 (two) times daily., Disp: 60 tablet, Rfl: 1   escitalopram (LEXAPRO) 10 MG tablet, Take 1 tablet (10 mg  total) by mouth at bedtime., Disp: 30 tablet, Rfl: 1   ezetimibe (ZETIA) 10 MG tablet, Take 1 tablet (10 mg total) by mouth daily., Disp: 30 tablet, Rfl: 1   furosemide (LASIX) 20 MG tablet, Take 3 tablets (60 mg) by mouth once daily, Disp: 270 tablet, Rfl: 1   Hydrocortisone Acetate 1 % CREA, Apply TID, Disp: , Rfl:    hydrOXYzine (ATARAX) 10 MG tablet, Take 10 mg by mouth 2 (two) times daily., Disp: , Rfl:    levETIRAcetam (KEPPRA) 250 MG tablet, Take 1 tablet (250 mg total) by mouth 2 (two) times daily., Disp: 60 tablet, Rfl: 0   levothyroxine (SYNTHROID) 25 MCG tablet, Take 1 tablet (25 mcg total) by mouth daily., Disp: 30 tablet, Rfl: 1   losartan (COZAAR) 25 MG tablet, Take 0.5 tablets (12.5 mg total) by mouth daily., Disp: 30 tablet, Rfl: 1   metoprolol succinate (TOPROL-XL) 25 MG 24 hr tablet, Take 0.5 tablets (12.5 mg total) by mouth at bedtime., Disp: 30 tablet, Rfl: 1   Multiple Vitamin (MULTIVITAMIN WITH MINERALS) TABS tablet, Take 1 tablet by mouth daily., Disp: 30 tablet, Rfl: 0   omeprazole (PRILOSEC) 20 MG capsule, Take 20 mg by mouth daily., Disp: , Rfl:    predniSONE (DELTASONE) 10 MG tablet, Take 10 mg by mouth daily with breakfast., Disp: , Rfl:    QUEtiapine (SEROQUEL) 200 MG tablet, Take 200 mg by mouth at bedtime., Disp: , Rfl:    rosuvastatin (CRESTOR) 10 MG tablet, Take 1 tablet (10 mg total) by mouth daily., Disp: 30 tablet, Rfl: 6   tiotropium (SPIRIVA) 18 MCG inhalation capsule, Place 18 mcg into inhaler and inhale daily., Disp: , Rfl:    traMADol (ULTRAM) 50 MG tablet, Take 1 tablet (50 mg total) by mouth 2 (two) times daily as needed., Disp: 10 tablet, Rfl: 0   MEDICATION ADHERENCES TIPS AND STRATEGIES Taking medication as prescribed improves patient outcomes in heart failure (reduces hospitalizations, improves symptoms, increases survival) Side effects of medications can be managed by decreasing doses, switching agents, stopping drugs, or adding additional therapy.  Please let someone in the Heart Failure Clinic know if you have having bothersome side effects so we can modify your regimen. Do not alter your medication regimen without talking to [place].  Medication reminders can help patients remember to take drugs on time. If you are missing or forgetting doses you can try linking behaviors, using pill boxes, or an electronic reminder like an alarm on your phone or an app. Some people can also get automated phone calls as medication reminders.

## 2022-05-22 NOTE — ED Notes (Signed)
Pt to Xray.

## 2022-05-22 NOTE — ED Provider Notes (Signed)
[facility] Provider Note    Event Date/Time   First MD Initiated Contact with Patient 05/22/22 1721     (approximate)   History   Chief Complaint ICD fired   HPI  Carl [name] is a 61 y.o. male with past medical history of hypertension, CAD status post CABG, CHF, COPD, stroke, atrial fibrillation on Eliquis, seizure, and bipolar disorder who presents to the ED complaining of ICD firing.  Patient reports that he was smoking a cigarette in his wheelchair and the next thing he knew he was falling to the ground.  He believes he may have lost consciousness, is not sure whether he hit his head.  He denies any associated chest pain or shortness of breath, but per EMS staff at his group home was notified that his ICD had fired.  Patient currently denies any complaints, does not remember being shocked.  He denies any fevers, chills, cough, nausea, vomiting, diarrhea, or abdominal pain.     Physical Exam   Triage Vital Signs: ED Triage Vitals [05/22/22 1716]  Enc Vitals Group     BP      Pulse      Resp      Temp      Temp src      SpO2      Weight      Height      Head Circumference      Peak Flow      Pain Score 0     Pain Loc      Pain Edu?      Excl. in GC?     Most recent vital signs: Vitals:   05/22/22 1725 05/22/22 1848  BP: (!) 122/48 111/67  Pulse: 67 64  Resp: 18 16  Temp: 97.9 F (36.6 C)   SpO2: 95% 96%    Constitutional: Alert and oriented. Eyes: Conjunctivae are normal. Head: Atraumatic. Nose: No congestion/rhinnorhea. Mouth/Throat: Mucous membranes are moist.  Neck: Midline cervical spine tenderness to palpation noted. Cardiovascular: Normal rate, regular rhythm. Grossly normal heart sounds.  2+ radial pulses bilaterally. Respiratory: Normal respiratory effort.  No retractions. Lungs CTAB.  No chest wall tenderness to palpation. Gastrointestinal: Soft and nontender. No distention. Musculoskeletal: No lower extremity  tenderness nor edema.  No upper extremity bony tenderness to palpation. Neurologic:  Normal speech and language. No gross focal neurologic deficits are appreciated.    ED Results / Procedures / Treatments   Labs (all labs ordered are listed, but only abnormal results are displayed) Labs Reviewed  BASIC METABOLIC PANEL - Abnormal; Notable for the following components:      Result Value   Potassium 2.7 (*)    Glucose, Bld 117 (*)    Creatinine, Ser 1.37 (*)    Calcium 8.7 (*)    GFR, Estimated 58 (*)    All other components within normal limits  CBC - Abnormal; Notable for the following components:   Hemoglobin 12.6 (*)    HCT 38.6 (*)    RDW 17.4 (*)    All other components within normal limits  TROPONIN I (HIGH SENSITIVITY) - Abnormal; Notable for the following components:   Troponin I (High Sensitivity) 78 (*)    All other components within normal limits  MAGNESIUM  TROPONIN I (HIGH SENSITIVITY)     EKG  ED ECG REPORT I, Chesley Noon, the attending physician, personally viewed and interpreted this ECG.   Date: 05/22/2022  EKG Time: 17:21  Rate: 70  Rhythm: Ventricular paced rhythm  Axis: Normal  Intervals:nonspecific intraventricular conduction delay  ST&T Change: None  RADIOLOGY CT head reviewed and interpreted by me with no hemorrhage or midline shift.  CT cervical spine reviewed and interpreted by me with no fracture or dislocation.  PROCEDURES:  Critical Care performed: Yes, see critical care procedure note(s)  .Critical Care  Performed by: Blake Divine, MD Authorized by: Blake Divine, MD   Critical care provider statement:    Critical care time (minutes):  30   Critical care time was exclusive of:  Separately billable procedures and treating other patients and teaching time   Critical care was necessary to treat or prevent imminent or life-threatening deterioration of the following conditions:  Cardiac failure   Critical care was time spent  personally by me on the following activities:  Development of treatment plan with patient or surrogate, discussions with consultants, evaluation of patient's response to treatment, examination of patient, ordering and review of laboratory studies, ordering and review of radiographic studies, ordering and performing treatments and interventions, pulse oximetry, re-evaluation of patient's condition and review of old charts   I assumed direction of critical care for this patient from another provider in my specialty: no     Care discussed with: admitting provider      [place] ED: Medications  magnesium sulfate IVPB 2 g 50 mL (2 g Intravenous New Bag/Given 05/22/22 1839)  potassium chloride 10 mEq in 100 mL IVPB (10 mEq Intravenous New Bag/Given 05/22/22 1836)  potassium chloride SA (KLOR-CON M) CR tablet 40 mEq (40 mEq Oral Given 05/22/22 1839)     IMPRESSION / MDM / [place] / ED COURSE  I reviewed the triage vital signs and the nursing notes.                              61 y.o. male with past medical history of hypertension, CAD, CHF, atrial fibrillation on Eliquis, stroke, COPD, seizures, bipolar disorder who presents to the ED complaining of fall out of his wheelchair where he is unsure if he hit his head, reportedly received a shock from his ICD earlier today.  Patient's presentation is most consistent with acute presentation with potential threat to life or bodily function.  Differential diagnosis includes, but is not limited to, arrhythmia, ACS, CHF, electrolyte abnormality, AKI, intracranial trauma, cervical spine injury.  Patient nontoxic-appearing and in no acute distress, vital signs are unremarkable.  EKG shows ventricular paced rhythm, will observe on cardiac monitor here and interrogate his defibrillator.  Upon chart review, patient does have a note stating that he received a shock for sustained VF at 9:43 this morning.  Patient recalls only falling out  of his wheelchair, does have midline cervical spine tenderness and we will check CT head and cervical spine.  He denies any chest pain or shortness of breath, will screen troponin as well as electrolytes.  Anticipate further discussion with cardiology.  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  This finding was discussed with Dr. Richardson Landry of ENT as well as radiology, appears to be a chronic finding and unlikely to represent acute infectious process and we will do off on antibiotics.  Case also discussed with Dr. Acie Fredrickson of cardiology, who agrees with plan for electrolyte repletion and admission for observation, no changes in medication regimen needed at this time.  Case discussed with hospitalist for admission.  FINAL CLINICAL IMPRESSION(S) / ED DIAGNOSES   Final diagnoses:  AICD discharge  Hypokalemia     Rx / DC Orders   ED Discharge Orders     None        Note:  This document was prepared using Dragon voice recognition software and may include unintentional dictation errors.   Blake Divine, MD 05/22/22 1901

## 2022-05-22 NOTE — ED Notes (Signed)
Report given to Alyssa, RN 

## 2022-05-22 NOTE — ED Notes (Signed)
Pt to CT. Pacemaker interrogated already.

## 2022-05-22 NOTE — ED Notes (Signed)
Dahlia Client, RN spoke with legal guardian [name] at [phone] who said this is good number to call her back if consent needed for anything.

## 2022-05-22 NOTE — ED Notes (Signed)
ED Provider at bedside. 

## 2022-05-22 NOTE — H&P (Signed)
History and Physical   [facility] - [place] @ [facility] Admission History and Physical [employer], D.O.    Patient Name: Carl [name] MR#: [id number] Date of Birth: 29-Jun-1961 Date of Admission: 05/22/2022  Referring MD/NP/PA: Dr. Larinda Buttery Primary Care Physician: Almetta Lovely, [facility]  Chief Complaint:  Chief Complaint  Patient presents with   ICD fired  Please note the entire history is obtained from the patient's emergency department chart, emergency department provider. Patient's personal history is limited by poor historian.   HPI: Carl [name] is a 61 y.o. male with a known history of atrial fibrillation on Eliquis, bipolar, CHF, CKD, COPD, depression, hypertension, CAD status post MI and CABG, seizures and CVA presents to the emergency department for evaluation of ICD discharge.  Patient was in a usual state of health at his living facility until 9:43 AM on 05/22/2022 [employer] contacted registered nurse at the patient's living facility to inform that the patient's ICD had discharged due to sustained V. tach/V-fib.  Rhythm was converted there were reportedly 2 nonsustained V. tach episodes recorded prior to the discharge.  Per nursing report the patient had some chest pain prior to the ICD discharge but was unaware of the discharge.  He was referred to the emergency department by cardiology due to the V-fib morphology.    Patient reports that he was smoking a cigarette and then fell out of his wheelchair this morning but denies any pain.  He denies any pain and does not recall being shocked. He does report posterior headaches recently, but not at present.   Patient denies fevers/chills, weakness, dizziness, shortness of breath, N/V/C/D, abdominal pain, dysuria/frequency, changes in mental status.    Otherwise there has been no change in status. Patient has been taking medication as prescribed and there has been no recent change in medication or diet.  No recent  antibiotics.  There has been no recent illness, hospitalizations, travel or sick contacts.    EMS/ED Course: Patient received magnesium, potassium. Medical admission has been requested for further management of ICD discharge, moderate hypokalemia.  Review of Systems:  CONSTITUTIONAL: No fever/chills, fatigue, weakness, weight gain/loss, headache. EYES: No blurry or double vision. ENT: No tinnitus, postnasal drip, redness or soreness of the oropharynx. RESPIRATORY: No cough, dyspnea, wheeze.  No hemoptysis.  CARDIOVASCULAR: No chest pain, palpitations, syncope, orthopnea. No lower extremity edema.  GASTROINTESTINAL: No nausea, vomiting, abdominal pain, diarrhea, constipation.  No hematemesis, melena or hematochezia. GENITOURINARY: No dysuria, frequency, hematuria. ENDOCRINE: No polyuria or nocturia. No heat or cold intolerance. HEMATOLOGY: No anemia, bruising, bleeding. INTEGUMENTARY: No rashes, ulcers, lesions. MUSCULOSKELETAL: No arthritis, gout. NEUROLOGIC: No numbness, tingling, ataxia, seizure-type activity, weakness. PSYCHIATRIC: No anxiety, depression, insomnia.   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   Seizures (HCC)    childhood   Stroke (HCC)    Tobacco abuse     Past Surgical History:  Procedure Laterality Date   BIV UPGRADE N/A 05/05/2020   Procedure: BIV ICD UPGRADE;  Surgeon: Duke Salvia, MD;  Location: [facility] INVASIVE CV LAB;  Service: Cardiovascular;  Laterality: N/A;   CORONARY ARTERY BYPASS GRAFT     INTRAVASCULAR PRESSURE WIRE/FFR STUDY N/A  04/25/2022   Procedure: INTRAVASCULAR PRESSURE WIRE/FFR STUDY;  Surgeon: Yvonne Kendall, MD;  Location: [facility] INVASIVE CV LAB;  Service: Cardiovascular;  Laterality: N/A;   RIGHT/LEFT HEART CATH AND CORONARY/GRAFT ANGIOGRAPHY N/A 04/25/2022   Procedure: RIGHT/LEFT HEART CATH AND CORONARY/GRAFT ANGIOGRAPHY;  Surgeon: Yvonne Kendall, MD;  Location: [facility] INVASIVE CV LAB;  Service: Cardiovascular;  Laterality: N/A;     reports that he has been smoking cigarettes. He has been smoking an average of 0.25 packs per day. He has never used smokeless tobacco. He reports that he does not currently use alcohol. He reports that he does not use drugs.  Allergies  Allergen Reactions   Iodinated Contrast Media Shortness Of Breath   Penicillins Anaphylaxis and Shortness Of Breath    Respiratory  Tolerated cefuroxime on 01/06/16   Strawberry Extract Anaphylaxis   Cefepime Itching    Empiric antibiotic, developed pruritis.    Erythromycin Itching   Sulfa Antibiotics Itching, Nausea And Vomiting and Nausea Only    Family History  Problem Relation Age of Onset   Cancer Mother        "femal cancer"   Prostate cancer Father     Prior to Admission medications   Medication Sig Start Date End Date Taking? Authorizing Provider  albuterol (PROVENTIL) (2.5 MG/3ML) 0.083% nebulizer solution Take 3 mLs (2.5 mg total) by nebulization every 6 (six) hours as needed for wheezing or shortness of breath. 03/21/22  Yes Lurene Shadow, MD  amiodarone (PACERONE) 400 MG tablet Take 1 tablet (400 mg total) by mouth daily. 05/01/22  Yes Enedina Finner, MD  apixaban (ELIQUIS) 5 MG TABS tablet TAKE 1 TABLET BY MOUTH TWICE A DAY 01/18/21  Yes Duke Salvia, MD  dapagliflozin propanediol (FARXIGA) 10 MG TABS tablet Take 1 tablet (10 mg total) by mouth daily. 05/01/22  Yes Enedina Finner, MD  divalproex (DEPAKOTE ER) 500 MG 24 hr tablet Take 1 tablet (500 mg total) by mouth 2 (two) times daily. 05/24/19  Yes Clapacs, Jackquline Denmark, MD   escitalopram (LEXAPRO) 10 MG tablet Take 1 tablet (10 mg total) by mouth at bedtime. 05/24/19  Yes Clapacs, Jackquline Denmark, MD  ezetimibe (ZETIA) 10 MG tablet Take 1 tablet (10 mg total) by mouth daily. 04/01/22  Yes Hollice Espy, MD  furosemide (LASIX) 20 MG tablet Take 3 tablets (60 mg) by mouth once daily 04/12/22  Yes Furth, Cadence H, PA-C  Hydrocortisone Acetate 1 % CREA Apply TID 05/15/22  Yes [provider]  levETIRAcetam (KEPPRA) 250 MG tablet Take 1 tablet (250 mg total) by mouth 2 (two) times daily. 03/24/20  Yes Minna Antis, MD  levothyroxine (SYNTHROID) 25 MCG tablet Take 1 tablet (25 mcg total) by mouth daily. 05/24/19  Yes Clapacs, Jackquline Denmark, MD  losartan (COZAAR) 25 MG tablet Take 0.5 tablets (12.5 mg total) by mouth daily. 05/01/22  Yes Enedina Finner, MD  metoprolol succinate (TOPROL-XL) 25 MG 24 hr tablet Take 0.5 tablets (12.5 mg total) by mouth at bedtime. 04/30/22  Yes Enedina Finner, MD  Multiple Vitamin (MULTIVITAMIN WITH MINERALS) TABS tablet Take 1 tablet by mouth daily. 05/01/22  Yes Enedina Finner, MD  omeprazole (PRILOSEC) 20 MG capsule Take 20 mg by mouth daily.   Yes [provider]  QUEtiapine (SEROQUEL) 200 MG tablet Take 200 mg by mouth at bedtime.   Yes [provider]  rosuvastatin (CRESTOR) 10  MG tablet Take 1 tablet (10 mg total) by mouth daily. 05/16/22  Yes Hackney, Inetta Fermo A, FNP  tiotropium (SPIRIVA) 18 MCG inhalation capsule Place 18 mcg into inhaler and inhale daily.   Yes [provider]  acetaminophen (TYLENOL) 500 MG tablet Take 500 mg by mouth every 6 (six) hours as needed for mild pain.    [provider]  albuterol (VENTOLIN HFA) 108 (90 Base) MCG/ACT inhaler Inhale 2 puffs into the lungs every 6 (six) hours as needed for wheezing. 05/24/19 05/22/22  Clapacs, Jackquline Denmark, MD  digoxin (LANOXIN) 0.125 MG tablet Take 0.125 mg by mouth daily. Patient not taking: Reported on 05/22/2022    [provider]  hydrOXYzine  (ATARAX) 10 MG tablet Take 10 mg by mouth 2 (two) times daily. 12/24/21   [provider]  predniSONE (DELTASONE) 10 MG tablet Take 10 mg by mouth daily with breakfast. Patient not taking: Reported on 05/22/2022    [provider]  traMADol (ULTRAM) 50 MG tablet Take 1 tablet (50 mg total) by mouth 2 (two) times daily as needed. 03/31/22   Hollice Espy, MD    Physical Exam: Vitals:   05/22/22 1725 05/22/22 1730 05/22/22 1848  BP: (!) 122/48  111/67  Pulse: 67  64  Resp: 18  16  Temp: 97.9 F (36.6 C)    TempSrc: Oral    SpO2: 95%  96%  Weight:  83.2 kg   Height:  5\' 9"  (1.753 m)     GENERAL: 61 y.o.-year-old male patient, well-developed, well-nourished lying in the bed in no acute distress.  Pleasant and cooperative.   HEENT: Head atraumatic, normocephalic. Pupils equal. Mucus membranes moist. NECK: Supple. No JVD. CHEST: Normal breath sounds bilaterally. No wheezing, rales, rhonchi or crackles. No use of accessory muscles of respiration.  No reproducible chest wall tenderness.  CARDIOVASCULAR: S1, S2 normal. No murmurs, rubs, or gallops. Cap refill <2 seconds. Pulses intact distally.  ABDOMEN: Soft, nondistended, nontender. No rebound, guarding, rigidity. Normoactive bowel sounds present in all four quadrants.  EXTREMITIES: No pedal edema, cyanosis, or clubbing. No calf tenderness or Homan's sign.  NEUROLOGIC: The patient is alert and oriented x 3. Cranial nerves II through XII are grossly intact with no focal sensorimotor deficit. PSYCHIATRIC:  Normal affect, mood, thought content. SKIN: Warm, dry, and intact without obvious rash, lesion, or ulcer.    Labs on Admission:  CBC: Recent Labs  Lab 05/22/22 1720  WBC 8.2  HGB 12.6*  HCT 38.6*  MCV 87.5  PLT 229   Basic Metabolic Panel: Recent Labs  Lab 05/22/22 1720  NA 136  K 2.7*  CL 100  CO2 26  GLUCOSE 117*  BUN 12  CREATININE 1.37*  CALCIUM 8.7*  MG 1.9   GFR: Estimated Creatinine  Clearance: 55.2 mL/min (A) (by C-G formula based on SCr of 1.37 mg/dL (H)). Liver Function Tests: No results for input(s): "AST", "ALT", "ALKPHOS", "BILITOT", "PROT", "ALBUMIN" in the last 168 hours. No results for input(s): "LIPASE", "AMYLASE" in the last 168 hours. No results for input(s): "AMMONIA" in the last 168 hours. Coagulation Profile: No results for input(s): "INR", "PROTIME" in the last 168 hours. Cardiac Enzymes: No results for input(s): "CKTOTAL", "CKMB", "CKMBINDEX", "TROPONINI" in the last 168 hours. BNP (last 3 results) No results for input(s): "PROBNP" in the last 8760 hours. HbA1C: No results for input(s): "HGBA1C" in the last 72 hours. CBG: No results for input(s): "GLUCAP" in the last 168 hours. Lipid Profile: No results  for input(s): "CHOL", "HDL", "LDLCALC", "TRIG", "CHOLHDL", "LDLDIRECT" in the last 72 hours. Thyroid Function Tests: No results for input(s): "TSH", "T4TOTAL", "FREET4", "T3FREE", "THYROIDAB" in the last 72 hours. Anemia Panel: No results for input(s): "VITAMINB12", "FOLATE", "FERRITIN", "TIBC", "IRON", "RETICCTPCT" in the last 72 hours. Urine analysis:    Component Value Date/Time   COLORURINE YELLOW (A) 03/14/2022 1850   APPEARANCEUR CLEAR (A) 03/14/2022 1850   LABSPEC 1.012 03/14/2022 1850   PHURINE 6.0 03/14/2022 1850   GLUCOSEU NEGATIVE 03/14/2022 1850   HGBUR NEGATIVE 03/14/2022 1850   BILIRUBINUR NEGATIVE 03/14/2022 1850   KETONESUR NEGATIVE 03/14/2022 1850   PROTEINUR 100 (A) 03/14/2022 1850   NITRITE NEGATIVE 03/14/2022 1850   LEUKOCYTESUR NEGATIVE 03/14/2022 1850   Sepsis Labs: @LABRCNTIP (procalcitonin:4,lacticidven:4) )No results found for this or any previous visit (from the past 240 hour(s)).   Radiological Exams on Admission: CT Head Wo Contrast  Result Date: 05/22/2022 CLINICAL DATA:  Fall to the floor this morning, possibly related to a pacemaker discharge. Headache. History of stroke. EXAM: CT HEAD WITHOUT CONTRAST CT  CERVICAL SPINE WITHOUT CONTRAST TECHNIQUE: Multidetector CT imaging of the head and cervical spine was performed following the standard protocol without intravenous contrast. Multiplanar CT image reconstructions of the cervical spine were also generated. RADIATION DOSE REDUCTION: This exam was performed according to the departmental dose-optimization program which includes automated exposure control, adjustment of the mA and/or kV according to patient size and/or use of iterative reconstruction technique. COMPARISON:  04/22/2022 FINDINGS: CT HEAD FINDINGS Brain: Stable appearance of large region left temporal, occipital, and parietal lobe encephalomalacia from prior stroke. Periventricular white matter and corona radiata hypodensities favor chronic ischemic microvascular white matter disease. Otherwise, the brainstem, cerebellum, cerebral peduncles, thalamus, basal ganglia, basilar cisterns, and ventricular system appear within normal limits. No intracranial hemorrhage, mass lesion, or acute CVA. Vascular: Atheromatous calcification of the bilateral vertebral arteries and of the cavernous carotid arteries. Skull: Unremarkable Sinuses/Orbits: Complete opacification of the visualized portion of the left maxillary sinus. Mucous retention cyst in the right maxillary sinus. Left mastoid effusion along the petrous apex, suspicious for left petrous apicitis. Other: No supplemental non-categorized findings. CT CERVICAL SPINE FINDINGS Alignment: No subluxation. There is mild levoconvex cervical scoliosis mild ting to about 15 degrees between C2 and T1. Skull base and vertebrae: No fracture or acute bony findings. Soft tissues and spinal canal: Bilateral common carotid atherosclerotic vascular calcification. Disc levels: Uncinate and facet spurring cause osseous foraminal stenosis on the left at C6-7 and C7-T1, and on the right at C6-7. Upper chest: Unremarkable Other: No supplemental non-categorized findings. IMPRESSION: 1.  No acute intracranial findings or acute cervical spine findings. 2. Left mastoid effusion along the petrous apex, suspicious for left petrous apicitis. Complete opacification of the visualized portion of the left maxillary sinus, cannot exclude acute left maxillary sinusitis. Chronic right maxillary sinusitis. 3. Stable appearance of large region of left temporal, occipital, and parietal lobe encephalomalacia from prior stroke. 4. Periventricular white matter and corona radiata hypodensities favor chronic ischemic microvascular white matter disease. 5. Cervical spondylosis and degenerative disc disease causing osseous foraminal stenosis at C6-7 and C7-T1. 6. Mild levoconvex cervical scoliosis. 7. Atherosclerosis. Electronically Signed   By: Gaylyn RongWalter  Liebkemann M.D.   On: 05/22/2022 18:15   CT Cervical Spine Wo Contrast  Result Date: 05/22/2022 CLINICAL DATA:  Fall to the floor this morning, possibly related to a pacemaker discharge. Headache. History of stroke. EXAM: CT HEAD WITHOUT CONTRAST CT CERVICAL SPINE WITHOUT CONTRAST TECHNIQUE: Multidetector CT imaging  of the head and cervical spine was performed following the standard protocol without intravenous contrast. Multiplanar CT image reconstructions of the cervical spine were also generated. RADIATION DOSE REDUCTION: This exam was performed according to the departmental dose-optimization program which includes automated exposure control, adjustment of the mA and/or kV according to patient size and/or use of iterative reconstruction technique. COMPARISON:  04/22/2022 FINDINGS: CT HEAD FINDINGS Brain: Stable appearance of large region left temporal, occipital, and parietal lobe encephalomalacia from prior stroke. Periventricular white matter and corona radiata hypodensities favor chronic ischemic microvascular white matter disease. Otherwise, the brainstem, cerebellum, cerebral peduncles, thalamus, basal ganglia, basilar cisterns, and ventricular system appear  within normal limits. No intracranial hemorrhage, mass lesion, or acute CVA. Vascular: Atheromatous calcification of the bilateral vertebral arteries and of the cavernous carotid arteries. Skull: Unremarkable Sinuses/Orbits: Complete opacification of the visualized portion of the left maxillary sinus. Mucous retention cyst in the right maxillary sinus. Left mastoid effusion along the petrous apex, suspicious for left petrous apicitis. Other: No supplemental non-categorized findings. CT CERVICAL SPINE FINDINGS Alignment: No subluxation. There is mild levoconvex cervical scoliosis mild ting to about 15 degrees between C2 and T1. Skull base and vertebrae: No fracture or acute bony findings. Soft tissues and spinal canal: Bilateral common carotid atherosclerotic vascular calcification. Disc levels: Uncinate and facet spurring cause osseous foraminal stenosis on the left at C6-7 and C7-T1, and on the right at C6-7. Upper chest: Unremarkable Other: No supplemental non-categorized findings. IMPRESSION: 1. No acute intracranial findings or acute cervical spine findings. 2. Left mastoid effusion along the petrous apex, suspicious for left petrous apicitis. Complete opacification of the visualized portion of the left maxillary sinus, cannot exclude acute left maxillary sinusitis. Chronic right maxillary sinusitis. 3. Stable appearance of large region of left temporal, occipital, and parietal lobe encephalomalacia from prior stroke. 4. Periventricular white matter and corona radiata hypodensities favor chronic ischemic microvascular white matter disease. 5. Cervical spondylosis and degenerative disc disease causing osseous foraminal stenosis at C6-7 and C7-T1. 6. Mild levoconvex cervical scoliosis. 7. Atherosclerosis. Electronically Signed   By: Gaylyn Rong M.D.   On: 05/22/2022 18:15   DG Chest 2 View  Result Date: 05/22/2022 CLINICAL DATA:  Chest pain EXAM: CHEST - 2 VIEW COMPARISON:  04/22/2022 FINDINGS: Post  sternotomy changes and valve prosthesis. Left-sided multi lead pacing device as before. Cardiomegaly with aortic atherosclerosis. No acute airspace disease, pleural effusion, or pneumothorax. Fixating hardware in the left clavicle. IMPRESSION: No active cardiopulmonary disease. Cardiomegaly. Electronically Signed   By: Jasmine Pang M.D.   On: 05/22/2022 17:57    EKG: Ventricular paced rhythm at 70 bpm with normal axis and nonspecific ST-T wave changes.   Assessment/Plan  This is a 61 y.o. male with a history of atrial fibrillation on Eliquis bipolar, CHF, CKD, COPD, depression, GERD, hypertension, hypothyroidism, CAD status post MI and CABG, seizures and CVA now being admitted with:  #.  AICD discharge -Admit to telemetry observation - Trend troponins - Cardiology has been consulted by emergency department physician no change to regimen recommended at this time.  #.  Moderate hypokalemia - Replace orally - Check mag level  #.  Mechanical fall, asymptomatic -We will need PCP to follow-up on the imaging findings from his brain CT of petrous apicitis.  Does not appear to be an acute finding or represent an infectious process. -Fall precautions  #. History of afib - Continue Eliquis, amiodarone, metoprolol  #. History of bipolar disorder, depression - Continue Lexapro Seroquel  #.  History of CHF - Continue Farxiga, Lasix, Cozaar, Crestor, Zetia  #. History of COPD - Continue Spiriva, albuterol  #. History of GERD - Continue Protonix  #.  History of hypothyroidism - Continue Synthroid  #.  History of seizures - Continue Keppra, Depakote  Admission status: Observation, tele IV Fluids: HL Diet/Nutrition: Heart healthy Consults called: [facility]  DVT Px: Eliquis, SCDs and early ambulation. Code Status: Full Code  Disposition Plan: To home in less than 24 hours.  All the records are reviewed and case discussed with ED provider. Management plans discussed with the  patient and/or family who express understanding and agree with plan of care.  Adea Geisel D.O. on 05/22/2022 at 6:55 PM CC: Primary care physician; [facility]   05/22/2022, 6:55 PM

## 2022-05-23 ENCOUNTER — Encounter: Payer: Self-pay | Admitting: Family Medicine

## 2022-05-23 ENCOUNTER — Other Ambulatory Visit: Payer: Self-pay

## 2022-05-23 DIAGNOSIS — E039 Hypothyroidism, unspecified: Secondary | ICD-10-CM | POA: Diagnosis present

## 2022-05-23 DIAGNOSIS — I255 Ischemic cardiomyopathy: Secondary | ICD-10-CM

## 2022-05-23 DIAGNOSIS — I69398 Other sequelae of cerebral infarction: Secondary | ICD-10-CM | POA: Diagnosis not present

## 2022-05-23 DIAGNOSIS — I10 Essential (primary) hypertension: Secondary | ICD-10-CM | POA: Diagnosis not present

## 2022-05-23 DIAGNOSIS — J320 Chronic maxillary sinusitis: Secondary | ICD-10-CM | POA: Diagnosis present

## 2022-05-23 DIAGNOSIS — E785 Hyperlipidemia, unspecified: Secondary | ICD-10-CM | POA: Diagnosis present

## 2022-05-23 DIAGNOSIS — E663 Overweight: Secondary | ICD-10-CM | POA: Diagnosis present

## 2022-05-23 DIAGNOSIS — I480 Paroxysmal atrial fibrillation: Secondary | ICD-10-CM

## 2022-05-23 DIAGNOSIS — Z4502 Encounter for adjustment and management of automatic implantable cardiac defibrillator: Secondary | ICD-10-CM

## 2022-05-23 DIAGNOSIS — R55 Syncope and collapse: Secondary | ICD-10-CM

## 2022-05-23 DIAGNOSIS — I5022 Chronic systolic (congestive) heart failure: Secondary | ICD-10-CM | POA: Diagnosis not present

## 2022-05-23 DIAGNOSIS — F319 Bipolar disorder, unspecified: Secondary | ICD-10-CM | POA: Diagnosis present

## 2022-05-23 DIAGNOSIS — I509 Heart failure, unspecified: Secondary | ICD-10-CM | POA: Diagnosis not present

## 2022-05-23 DIAGNOSIS — I472 Ventricular tachycardia, unspecified: Secondary | ICD-10-CM | POA: Diagnosis present

## 2022-05-23 DIAGNOSIS — N183 Chronic kidney disease, stage 3 unspecified: Secondary | ICD-10-CM | POA: Diagnosis present

## 2022-05-23 DIAGNOSIS — E876 Hypokalemia: Secondary | ICD-10-CM | POA: Diagnosis present

## 2022-05-23 DIAGNOSIS — I5042 Chronic combined systolic (congestive) and diastolic (congestive) heart failure: Secondary | ICD-10-CM | POA: Diagnosis not present

## 2022-05-23 DIAGNOSIS — J449 Chronic obstructive pulmonary disease, unspecified: Secondary | ICD-10-CM | POA: Diagnosis present

## 2022-05-23 DIAGNOSIS — G9389 Other specified disorders of brain: Secondary | ICD-10-CM | POA: Diagnosis present

## 2022-05-23 DIAGNOSIS — I25118 Atherosclerotic heart disease of native coronary artery with other forms of angina pectoris: Secondary | ICD-10-CM | POA: Diagnosis not present

## 2022-05-23 DIAGNOSIS — W050XXA Fall from non-moving wheelchair, initial encounter: Secondary | ICD-10-CM | POA: Diagnosis present

## 2022-05-23 DIAGNOSIS — E1122 Type 2 diabetes mellitus with diabetic chronic kidney disease: Secondary | ICD-10-CM | POA: Diagnosis present

## 2022-05-23 DIAGNOSIS — I5043 Acute on chronic combined systolic (congestive) and diastolic (congestive) heart failure: Secondary | ICD-10-CM | POA: Diagnosis present

## 2022-05-23 DIAGNOSIS — Z7901 Long term (current) use of anticoagulants: Secondary | ICD-10-CM | POA: Diagnosis not present

## 2022-05-23 DIAGNOSIS — Z8616 Personal history of COVID-19: Secondary | ICD-10-CM | POA: Diagnosis not present

## 2022-05-23 DIAGNOSIS — I2489 Other forms of acute ischemic heart disease: Secondary | ICD-10-CM | POA: Diagnosis present

## 2022-05-23 DIAGNOSIS — I4901 Ventricular fibrillation: Secondary | ICD-10-CM | POA: Diagnosis present

## 2022-05-23 DIAGNOSIS — I959 Hypotension, unspecified: Secondary | ICD-10-CM | POA: Diagnosis present

## 2022-05-23 DIAGNOSIS — K219 Gastro-esophageal reflux disease without esophagitis: Secondary | ICD-10-CM | POA: Diagnosis present

## 2022-05-23 DIAGNOSIS — R569 Unspecified convulsions: Secondary | ICD-10-CM | POA: Diagnosis present

## 2022-05-23 DIAGNOSIS — F17210 Nicotine dependence, cigarettes, uncomplicated: Secondary | ICD-10-CM | POA: Diagnosis present

## 2022-05-23 DIAGNOSIS — I130 Hypertensive heart and chronic kidney disease with heart failure and stage 1 through stage 4 chronic kidney disease, or unspecified chronic kidney disease: Secondary | ICD-10-CM | POA: Diagnosis present

## 2022-05-23 LAB — COMPREHENSIVE METABOLIC PANEL
ALT: 9 U/L (ref 0–44)
AST: 17 U/L (ref 15–41)
Albumin: 2.9 g/dL — ABNORMAL LOW (ref 3.5–5.0)
Alkaline Phosphatase: 44 U/L (ref 38–126)
Anion gap: 8 (ref 5–15)
BUN: 13 mg/dL (ref 8–23)
CO2: 27 mmol/L (ref 22–32)
Calcium: 9 mg/dL (ref 8.9–10.3)
Chloride: 103 mmol/L (ref 98–111)
Creatinine, Ser: 1.23 mg/dL (ref 0.61–1.24)
GFR, Estimated: >60 mL/min (ref >60)
Glucose, Bld: 113 mg/dL — ABNORMAL HIGH (ref 70–99)
Potassium: 3.3 mmol/L — ABNORMAL LOW (ref 3.5–5.1)
Sodium: 138 mmol/L (ref 135–145)
Total Bilirubin: 0.6 mg/dL (ref 0.3–1.2)
Total Protein: 6.4 g/dL — ABNORMAL LOW (ref 6.5–8.1)

## 2022-05-23 LAB — CBC
HCT: 38 % — ABNORMAL LOW (ref 39.0–52.0)
Hemoglobin: 12.2 g/dL — ABNORMAL LOW (ref 13.0–17.0)
MCH: 28 pg (ref 26.0–34.0)
MCHC: 32.1 g/dL (ref 30.0–36.0)
MCV: 87.4 fL (ref 80.0–100.0)
Platelets: 205 10*3/uL (ref 150–400)
RBC: 4.35 MIL/uL (ref 4.22–5.81)
RDW: 17.5 % — ABNORMAL HIGH (ref 11.5–15.5)
WBC: 7.1 10*3/uL (ref 4.0–10.5)
nRBC: 0 % (ref 0.0–0.2)

## 2022-05-23 LAB — TROPONIN I (HIGH SENSITIVITY)
Troponin I (High Sensitivity): 76 ng/L — ABNORMAL HIGH (ref <18)
Troponin I (High Sensitivity): 83 ng/L — ABNORMAL HIGH (ref <18)
Troponin I (High Sensitivity): 68 ng/L — ABNORMAL HIGH (ref <18)

## 2022-05-23 LAB — MAGNESIUM: Magnesium: 2.3 mg/dL (ref 1.7–2.4)

## 2022-05-23 LAB — TSH: TSH: 11.393 u[IU]/mL — ABNORMAL HIGH (ref 0.350–4.500)

## 2022-05-23 LAB — GLUCOSE, CAPILLARY
Glucose-Capillary: 143 mg/dL — ABNORMAL HIGH (ref 70–99)
Glucose-Capillary: 142 mg/dL — ABNORMAL HIGH (ref 70–99)

## 2022-05-23 MED ORDER — SENNOSIDES-DOCUSATE SODIUM 8.6-50 MG PO TABS: 1.0000 | ORAL_TABLET | Freq: Every evening | ORAL | Status: DC | PRN

## 2022-05-23 MED ORDER — ROSUVASTATIN CALCIUM 10 MG PO TABS
10.0000 mg | ORAL_TABLET | Freq: Every day | ORAL | Status: DC
  Administered 2022-05-24 – 2022-05-25 (×2): 10 mg via ORAL
  Filled 2022-05-23 (×2): qty 1

## 2022-05-23 MED ORDER — QUETIAPINE FUMARATE 25 MG PO TABS
200.0000 mg | ORAL_TABLET | Freq: Every day | ORAL | Status: DC
  Administered 2022-05-23 – 2022-05-24 (×3): 200 mg via ORAL
  Filled 2022-05-23: qty 8
  Filled 2022-05-23: qty 1
  Filled 2022-05-23: qty 8

## 2022-05-23 MED ORDER — LOSARTAN POTASSIUM 25 MG PO TABS
12.5000 mg | ORAL_TABLET | Freq: Every day | ORAL | Status: DC
  Administered 2022-05-23 – 2022-05-25 (×3): 12.5 mg via ORAL
  Filled 2022-05-23 (×2): qty 1
  Filled 2022-05-23: qty 0.5

## 2022-05-23 MED ORDER — FUROSEMIDE 10 MG/ML IJ SOLN
40.0000 mg | Freq: Two times a day (BID) | INTRAMUSCULAR | Status: DC
  Administered 2022-05-23 – 2022-05-24 (×3): 40 mg via INTRAVENOUS
  Filled 2022-05-23 (×2): qty 4

## 2022-05-23 MED ORDER — TIOTROPIUM BROMIDE MONOHYDRATE 18 MCG IN CAPS
18.0000 ug | ORAL_CAPSULE | Freq: Every day | RESPIRATORY_TRACT | Status: DC
  Administered 2022-05-24 – 2022-05-25 (×2): 18 ug via RESPIRATORY_TRACT
  Filled 2022-05-23: qty 5

## 2022-05-23 MED ORDER — ZOLPIDEM TARTRATE 5 MG PO TABS: 5.0000 mg | ORAL_TABLET | Freq: Every evening | ORAL | Status: DC | PRN

## 2022-05-23 MED ORDER — BISACODYL 5 MG PO TBEC: 5.0000 mg | DELAYED_RELEASE_TABLET | Freq: Every day | ORAL | Status: DC | PRN

## 2022-05-23 MED ORDER — AMIODARONE HCL 200 MG PO TABS
400.0000 mg | ORAL_TABLET | Freq: Two times a day (BID) | ORAL | Status: DC
  Administered 2022-05-23 – 2022-05-25 (×5): 400 mg via ORAL
  Filled 2022-05-23 (×5): qty 2

## 2022-05-23 MED ORDER — ALBUTEROL SULFATE (2.5 MG/3ML) 0.083% IN NEBU: 2.5000 mg | INHALATION_SOLUTION | Freq: Four times a day (QID) | RESPIRATORY_TRACT | Status: DC | PRN

## 2022-05-23 MED ORDER — LEVOTHYROXINE SODIUM 25 MCG PO TABS
25.0000 ug | ORAL_TABLET | Freq: Every day | ORAL | Status: DC
  Administered 2022-05-23 – 2022-05-25 (×3): 25 ug via ORAL
  Filled 2022-05-23 (×3): qty 1

## 2022-05-23 MED ORDER — MORPHINE SULFATE (PF) 2 MG/ML IV SOLN: 1.0000 mg | Freq: Four times a day (QID) | INTRAVENOUS | Status: DC | PRN

## 2022-05-23 MED ORDER — SPIRONOLACTONE 12.5 MG HALF TABLET
12.5000 mg | ORAL_TABLET | Freq: Every day | ORAL | Status: DC
  Administered 2022-05-24: 12.5 mg via ORAL
  Filled 2022-05-23 (×2): qty 1

## 2022-05-23 MED ORDER — INSULIN ASPART 100 UNIT/ML IJ SOLN
0.0000 [IU] | Freq: Three times a day (TID) | INTRAMUSCULAR | Status: DC
  Administered 2022-05-23 – 2022-05-25 (×4): 2 [IU] via SUBCUTANEOUS
  Filled 2022-05-23 (×4): qty 1

## 2022-05-23 MED ORDER — NITROGLYCERIN 0.4 MG SL SUBL: 0.4000 mg | SUBLINGUAL_TABLET | SUBLINGUAL | Status: DC | PRN

## 2022-05-23 MED ORDER — APIXABAN 5 MG PO TABS
5.0000 mg | ORAL_TABLET | Freq: Two times a day (BID) | ORAL | Status: DC
  Administered 2022-05-23 – 2022-05-25 (×6): 5 mg via ORAL
  Filled 2022-05-23 (×6): qty 1

## 2022-05-23 MED ORDER — LEVOTHYROXINE SODIUM 25 MCG PO TABS: 25.0000 ug | ORAL_TABLET | Freq: Every day | ORAL | Status: DC

## 2022-05-23 MED ORDER — SODIUM CHLORIDE 0.9% FLUSH
3.0000 mL | Freq: Two times a day (BID) | INTRAVENOUS | Status: DC
  Administered 2022-05-23 – 2022-05-25 (×5): 3 mL via INTRAVENOUS

## 2022-05-23 MED ORDER — POTASSIUM CHLORIDE CRYS ER 20 MEQ PO TBCR
40.0000 meq | EXTENDED_RELEASE_TABLET | Freq: Two times a day (BID) | ORAL | Status: DC
  Administered 2022-05-23 – 2022-05-25 (×5): 40 meq via ORAL
  Filled 2022-05-23 (×5): qty 2

## 2022-05-23 MED ORDER — AMIODARONE HCL 200 MG PO TABS: 400.0000 mg | ORAL_TABLET | Freq: Every day | ORAL | Status: DC

## 2022-05-23 MED ORDER — DAPAGLIFLOZIN PROPANEDIOL 10 MG PO TABS
10.0000 mg | ORAL_TABLET | Freq: Every day | ORAL | Status: DC
  Administered 2022-05-23 – 2022-05-25 (×3): 10 mg via ORAL
  Filled 2022-05-23 (×3): qty 1

## 2022-05-23 MED ORDER — ACETAMINOPHEN 500 MG PO TABS: 500.0000 mg | ORAL_TABLET | Freq: Four times a day (QID) | ORAL | Status: DC | PRN

## 2022-05-23 MED ORDER — FUROSEMIDE 40 MG PO TABS
60.0000 mg | ORAL_TABLET | Freq: Every day | ORAL | Status: DC
  Administered 2022-05-23: 60 mg via ORAL
  Filled 2022-05-23: qty 2

## 2022-05-23 MED ORDER — ACETAMINOPHEN 325 MG PO TABS: 650.0000 mg | ORAL_TABLET | Freq: Four times a day (QID) | ORAL | Status: DC | PRN

## 2022-05-23 MED ORDER — ADULT MULTIVITAMIN W/MINERALS CH
1.0000 | ORAL_TABLET | Freq: Every day | ORAL | Status: DC
  Administered 2022-05-23 – 2022-05-25 (×3): 1 via ORAL
  Filled 2022-05-23 (×3): qty 1

## 2022-05-23 MED ORDER — PANTOPRAZOLE SODIUM 40 MG PO TBEC
40.0000 mg | DELAYED_RELEASE_TABLET | Freq: Every day | ORAL | Status: DC
  Administered 2022-05-23 – 2022-05-25 (×3): 40 mg via ORAL
  Filled 2022-05-23 (×3): qty 1

## 2022-05-23 MED ORDER — LEVETIRACETAM 250 MG PO TABS
250.0000 mg | ORAL_TABLET | Freq: Two times a day (BID) | ORAL | Status: DC
  Administered 2022-05-23 – 2022-05-25 (×6): 250 mg via ORAL
  Filled 2022-05-23 (×6): qty 1

## 2022-05-23 MED ORDER — INSULIN ASPART 100 UNIT/ML IJ SOLN
0.0000 [IU] | Freq: Every day | INTRAMUSCULAR | Status: DC
  Filled 2022-05-23: qty 1

## 2022-05-23 MED ORDER — ACETAMINOPHEN 650 MG RE SUPP: 650.0000 mg | Freq: Four times a day (QID) | RECTAL | Status: DC | PRN

## 2022-05-23 MED ORDER — HYDRALAZINE HCL 20 MG/ML IJ SOLN: 5.0000 mg | Freq: Four times a day (QID) | INTRAMUSCULAR | Status: DC | PRN

## 2022-05-23 MED ORDER — ONDANSETRON HCL 4 MG PO TABS: 4.0000 mg | ORAL_TABLET | Freq: Four times a day (QID) | ORAL | Status: DC | PRN

## 2022-05-23 MED ORDER — HYDROXYZINE HCL 10 MG PO TABS
10.0000 mg | ORAL_TABLET | Freq: Two times a day (BID) | ORAL | Status: DC
  Administered 2022-05-23 – 2022-05-25 (×6): 10 mg via ORAL
  Filled 2022-05-23 (×6): qty 1

## 2022-05-23 MED ORDER — TRAMADOL HCL 50 MG PO TABS: 50.0000 mg | ORAL_TABLET | Freq: Two times a day (BID) | ORAL | Status: DC | PRN

## 2022-05-23 MED ORDER — HYDROCODONE-ACETAMINOPHEN 5-325 MG PO TABS: 1.0000 | ORAL_TABLET | ORAL | Status: DC | PRN

## 2022-05-23 MED ORDER — ISOSORBIDE MONONITRATE ER 30 MG PO TB24
15.0000 mg | ORAL_TABLET | Freq: Every day | ORAL | Status: DC
  Administered 2022-05-24 – 2022-05-25 (×2): 15 mg via ORAL
  Filled 2022-05-23 (×3): qty 1

## 2022-05-23 MED ORDER — ESCITALOPRAM OXALATE 10 MG PO TABS
10.0000 mg | ORAL_TABLET | Freq: Every day | ORAL | Status: DC
  Administered 2022-05-23 – 2022-05-24 (×3): 10 mg via ORAL
  Filled 2022-05-23 (×3): qty 1

## 2022-05-23 MED ORDER — ALBUTEROL SULFATE HFA 108 (90 BASE) MCG/ACT IN AERS: 2.0000 | INHALATION_SPRAY | Freq: Four times a day (QID) | RESPIRATORY_TRACT | Status: DC | PRN

## 2022-05-23 MED ORDER — EZETIMIBE 10 MG PO TABS
10.0000 mg | ORAL_TABLET | Freq: Every day | ORAL | Status: DC
  Administered 2022-05-23 – 2022-05-25 (×3): 10 mg via ORAL
  Filled 2022-05-23 (×3): qty 1

## 2022-05-23 MED ORDER — DIVALPROEX SODIUM ER 500 MG PO TB24
500.0000 mg | ORAL_TABLET | Freq: Two times a day (BID) | ORAL | Status: DC
  Administered 2022-05-23 – 2022-05-25 (×6): 500 mg via ORAL
  Filled 2022-05-23: qty 1
  Filled 2022-05-23: qty 2
  Filled 2022-05-23 (×2): qty 1
  Filled 2022-05-23: qty 2
  Filled 2022-05-23: qty 1

## 2022-05-23 MED ORDER — ONDANSETRON HCL 4 MG/2ML IJ SOLN: 4.0000 mg | Freq: Four times a day (QID) | INTRAMUSCULAR | Status: DC | PRN

## 2022-05-23 MED ORDER — METOPROLOL SUCCINATE ER 25 MG PO TB24
12.5000 mg | ORAL_TABLET | Freq: Every day | ORAL | Status: DC
  Administered 2022-05-23 – 2022-05-24 (×2): 12.5 mg via ORAL
  Filled 2022-05-23: qty 0.5
  Filled 2022-05-23 (×2): qty 1

## 2022-05-23 NOTE — Progress Notes (Signed)
PROGRESS NOTE    Carl [name]  GQQ:[id number] DOB: [birth year] DOA: 05/22/2022 PCP: Almetta Lovely, [facility]  Assessment & Plan:   Principal Problem:   AICD discharge  Assessment and Plan: AICD discharge: continue on tele. Electrolytes repleated. 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 Fib: likely PAF. Continue on amiodarone, metoprolol, eliquis    Bipolar disorder: unknown severity or type. Continue on home dose of lexapro, seroquel   Chronic systolic CHF: continue on home dose of lasix, losartan, statin, metoprolol & farxiga   Hx of COPD: w/o exacerbation. Continue on bronchodilators    GERD: continue on PPI    Hypothyroidism: continue on home dose of synthroid    Seizures: continue on home dose of keppra, depakote       DVT prophylaxis:  eliquis  Code Status: full  Family Communication:  Disposition Plan: likely d/c back to home facility   Level of care: Telemetry Cardiac  Status is: Inpatient Remains inpatient appropriate because: severity of illness      Consultants:  cardio  Procedures:  Antimicrobials:   Subjective: Pt c/o malaise   Objective: Vitals:   05/22/22 2321 05/23/22 0051 05/23/22 0352 05/23/22 0527  BP: (!) 118/57 (!) 100/47 118/63 111/71  Pulse: 67 73 66 73  Resp: 18 18 18 18   Temp:    98.2 F (36.8 C)  TempSrc:    Oral  SpO2: 95% 93% 97% 98%  Weight:      Height:        Intake/Output Summary (Last 24 hours) at 05/23/2022 0839 Last data filed at 05/22/2022 2329 Gross per 24 hour  Intake 342.26 ml  Output --  Net 342.26 ml   Filed Weights   05/22/22 1730  Weight: 83.2 kg    Examination:  General exam: Appears calm and comfortable  Respiratory system: Clear to auscultation. Respiratory effort normal. Cardiovascular system: S1 & S2 +. No rubs, gallops or clicks.  Gastrointestinal system: Abdomen is nondistended, soft and nontender. Normal bowel sounds heard. Central nervous system: Alert and awake. Moves all extremities Psychiatry: Judgement and insight appears at baseline. Mood & affect appropriate.     Data Reviewed: I have personally reviewed following labs and imaging studies  CBC: Recent Labs  Lab 05/22/22 1720 05/23/22 0120  WBC 8.2 7.1  HGB 12.6* 12.2*  HCT 38.6* 38.0*  MCV 87.5 87.4  PLT 229 205   Basic Metabolic Panel: Recent Labs  Lab 05/22/22 1720 05/23/22 0120  NA 136 138  K 2.7* 3.3*  CL 100 103  CO2 26 27  GLUCOSE 117* 113*  BUN 12 13  CREATININE 1.37* 1.23  CALCIUM 8.7* 9.0  MG 1.9 2.3   GFR: Estimated Creatinine Clearance: 61.5 mL/min (by C-G formula based on SCr of 1.23 mg/dL). Liver Function Tests: Recent Labs  Lab 05/23/22 0120  AST 17  ALT 9  ALKPHOS 44  BILITOT 0.6  PROT 6.4*  ALBUMIN 2.9*   No results for input(s): "LIPASE", "AMYLASE" in the last 168 hours. No results for input(s): "AMMONIA" in the last 168 hours. Coagulation Profile: No results for input(s): "INR", "PROTIME" in the last 168 hours. Cardiac Enzymes: No results for input(s): "CKTOTAL", "CKMB", "CKMBINDEX", "TROPONINI" in the last 168 hours. BNP (last 3 results) No results for input(s): "PROBNP" in the last 8760 hours. HbA1C: No results for input(s): "HGBA1C" in the last 72 hours. CBG:  No results for input(s): "GLUCAP" in the last 168 hours. Lipid Profile: No results for input(s): "CHOL", "HDL", "LDLCALC", "TRIG", "CHOLHDL", "LDLDIRECT" in the last 72 hours. Thyroid Function Tests: Recent Labs    05/23/22 0120  TSH 11.393*   Anemia Panel: No results for input(s): "VITAMINB12", "FOLATE", "FERRITIN", "TIBC", "IRON", "RETICCTPCT" in the last 72 hours. Sepsis Labs: No results for input(s): "PROCALCITON", "LATICACIDVEN" in the last 168 hours.  No results found for this or any previous visit (from the past 240 hour(s)).        Radiology Studies: CT Head Wo Contrast  Result Date: 05/22/2022 CLINICAL DATA:  Fall to the floor this morning, possibly related to a pacemaker discharge. Headache. History of stroke. EXAM: CT HEAD WITHOUT CONTRAST CT CERVICAL SPINE WITHOUT CONTRAST TECHNIQUE: Multidetector CT imaging of the head and cervical spine was performed following the standard protocol without intravenous contrast. Multiplanar CT image reconstructions of the cervical spine were also generated. RADIATION DOSE REDUCTION: This exam was performed according to the departmental dose-optimization program which includes automated exposure control, adjustment of the mA and/or kV according to patient size and/or use of iterative reconstruction technique. COMPARISON:  04/22/2022 FINDINGS: CT HEAD FINDINGS Brain: Stable appearance of large region left temporal, occipital, and parietal lobe encephalomalacia from prior stroke. Periventricular white matter and corona radiata hypodensities favor chronic ischemic microvascular white matter disease. Otherwise, the brainstem, cerebellum, cerebral peduncles, thalamus, basal ganglia, basilar cisterns, and ventricular system appear within normal limits. No intracranial hemorrhage, mass lesion, or acute CVA. Vascular: Atheromatous calcification of the bilateral vertebral arteries and of the cavernous carotid arteries. Skull: Unremarkable Sinuses/Orbits: Complete opacification of the visualized portion of the left maxillary sinus. Mucous retention cyst in the right maxillary sinus. Left mastoid effusion along the petrous apex, suspicious for left petrous apicitis. Other: No supplemental non-categorized findings. CT CERVICAL SPINE FINDINGS Alignment: No subluxation. There is mild levoconvex cervical scoliosis mild ting to about 15 degrees between C2 and T1. Skull base and vertebrae: No fracture or acute bony findings. Soft tissues and spinal canal: Bilateral common carotid atherosclerotic vascular  calcification. Disc levels: Uncinate and facet spurring cause osseous foraminal stenosis on the left at C6-7 and C7-T1, and on the right at C6-7. Upper chest: Unremarkable Other: No supplemental non-categorized findings. IMPRESSION: 1. No acute intracranial findings or acute cervical spine findings. 2. Left mastoid effusion along the petrous apex, suspicious for left petrous apicitis. Complete opacification of the visualized portion of the left maxillary sinus, cannot exclude acute left maxillary sinusitis. Chronic right maxillary sinusitis. 3. Stable appearance of large region of left temporal, occipital, and parietal lobe encephalomalacia from prior stroke. 4. Periventricular white matter and corona radiata hypodensities favor chronic ischemic microvascular white matter disease. 5. Cervical spondylosis and degenerative disc disease causing osseous foraminal stenosis at C6-7 and C7-T1. 6. Mild levoconvex cervical scoliosis. 7. Atherosclerosis. Electronically Signed   By: Gaylyn Rong M.D.   On: 05/22/2022 18:15   CT Cervical Spine Wo Contrast  Result Date: 05/22/2022 CLINICAL DATA:  Fall to the floor this morning, possibly related to a pacemaker discharge. Headache. History of stroke. EXAM: CT HEAD WITHOUT CONTRAST CT CERVICAL SPINE WITHOUT CONTRAST TECHNIQUE: Multidetector CT imaging of the head and cervical spine was performed following the standard protocol without intravenous contrast. Multiplanar CT image reconstructions of the cervical spine were also generated. RADIATION DOSE REDUCTION: This exam was performed according to the departmental dose-optimization program which includes automated exposure control, adjustment of the mA and/or kV according to patient size  and/or use of iterative reconstruction technique. COMPARISON:  04/22/2022 FINDINGS: CT HEAD FINDINGS Brain: Stable appearance of large region left temporal, occipital, and parietal lobe encephalomalacia from prior stroke. Periventricular  white matter and corona radiata hypodensities favor chronic ischemic microvascular white matter disease. Otherwise, the brainstem, cerebellum, cerebral peduncles, thalamus, basal ganglia, basilar cisterns, and ventricular system appear within normal limits. No intracranial hemorrhage, mass lesion, or acute CVA. Vascular: Atheromatous calcification of the bilateral vertebral arteries and of the cavernous carotid arteries. Skull: Unremarkable Sinuses/Orbits: Complete opacification of the visualized portion of the left maxillary sinus. Mucous retention cyst in the right maxillary sinus. Left mastoid effusion along the petrous apex, suspicious for left petrous apicitis. Other: No supplemental non-categorized findings. CT CERVICAL SPINE FINDINGS Alignment: No subluxation. There is mild levoconvex cervical scoliosis mild ting to about 15 degrees between C2 and T1. Skull base and vertebrae: No fracture or acute bony findings. Soft tissues and spinal canal: Bilateral common carotid atherosclerotic vascular calcification. Disc levels: Uncinate and facet spurring cause osseous foraminal stenosis on the left at C6-7 and C7-T1, and on the right at C6-7. Upper chest: Unremarkable Other: No supplemental non-categorized findings. IMPRESSION: 1. No acute intracranial findings or acute cervical spine findings. 2. Left mastoid effusion along the petrous apex, suspicious for left petrous apicitis. Complete opacification of the visualized portion of the left maxillary sinus, cannot exclude acute left maxillary sinusitis. Chronic right maxillary sinusitis. 3. Stable appearance of large region of left temporal, occipital, and parietal lobe encephalomalacia from prior stroke. 4. Periventricular white matter and corona radiata hypodensities favor chronic ischemic microvascular white matter disease. 5. Cervical spondylosis and degenerative disc disease causing osseous foraminal stenosis at C6-7 and C7-T1. 6. Mild levoconvex cervical  scoliosis. 7. Atherosclerosis. Electronically Signed   By: Gaylyn Rong M.D.   On: 05/22/2022 18:15   DG Chest 2 View  Result Date: 05/22/2022 CLINICAL DATA:  Chest pain EXAM: CHEST - 2 VIEW COMPARISON:  04/22/2022 FINDINGS: Post sternotomy changes and valve prosthesis. Left-sided multi lead pacing device as before. Cardiomegaly with aortic atherosclerosis. No acute airspace disease, pleural effusion, or pneumothorax. Fixating hardware in the left clavicle. IMPRESSION: No active cardiopulmonary disease. Cardiomegaly. Electronically Signed   By: Jasmine Pang M.D.   On: 05/22/2022 17:57        Scheduled Meds:  amiodarone  400 mg Oral BID   apixaban  5 mg Oral BID   dapagliflozin propanediol  10 mg Oral Daily   divalproex  500 mg Oral BID   escitalopram  10 mg Oral QHS   ezetimibe  10 mg Oral Daily   furosemide  60 mg Oral Daily   hydrOXYzine  10 mg Oral BID   insulin aspart  0-15 Units Subcutaneous TID WC   insulin aspart  0-5 Units Subcutaneous QHS   levETIRAcetam  250 mg Oral BID   levothyroxine  25 mcg Oral Q0600   losartan  12.5 mg Oral Daily   metoprolol succinate  12.5 mg Oral QHS   multivitamin with minerals  1 tablet Oral Daily   pantoprazole  40 mg Oral Daily   potassium chloride  40 mEq Oral BID   QUEtiapine  200 mg Oral QHS   rosuvastatin  10 mg Oral Daily   sodium chloride flush  3 mL Intravenous Q12H   [START ON 05/24/2022] tiotropium  18 mcg Inhalation Daily   Continuous Infusions:   LOS: 0 days    Time spent: 35 mins    Charise Killian, MD [facility] Pager [phone]  If 7PM-7AM, please contact night-coverage [email or web address] 05/23/2022, 8:39 AM

## 2022-05-23 NOTE — Consult Note (Signed)
Cardiology Consult    Patient ID: Carl [name] MRN: [id number], DOB/AGE: 63-Aug-1962   Admit date: 05/22/2022 Date of Consult: 05/23/2022  Primary Physician: Almetta Lovely, [facility] Primary Cardiologist: Lorine Bears, MD Requesting Provider: Wilfred Lacy, MD  Patient Profile    Carl [name] is a 61 y.o. male with a history of CAD s/p CABG, ICM, HFrEF, VT/VF s/p CRT-D, HTN, HL, PAF, tob abuse, COPD, CKD II-III, stroke, and bipolar d/o, who is being seen today for the evaluation of syncope and VF s/p ICD discharge at the request of Dr. Mayford Knife.  Past Medical History   Past Medical History:  Diagnosis Date   Acute pulmonary edema (HCC) 2017   Acute respiratory failure with hypoxia (HCC) 2017   Bipolar 1 disorder (HCC)    CAD (coronary artery disease)    a. Prior RCA stenting; b. 2017 s/p CABG x 2 (LIMA->D1, VG->OM2); b. 04/2022 Demand Isch/Cath: LM mild dzs, LAD 47m, D1 100, LCX mod diff dzs, OM2 100, OM3 50, RCA 40/20p ISR, 85d (iFR 0.99), 50d, RPAV 60, LIMA->D1 small, patent, VG->OM2 large, mild dzs-->Med Rx.   Chronic HFrEF (heart failure with reduced ejection fraction) (HCC)    a. 02/2022 Echo: EF 25-30%, mild conc LVH, nl RV fxn, sev dil LA, mildly dil RA, mild MR/AI.   CKD (chronic kidney disease), stage III (HCC)    COPD (chronic obstructive pulmonary disease) (HCC)    Depression    Hypertension    Ischemic cardiomyopathy    a. 04/2020 s/p MDT MRI compatible CRT-D (Ser # [id number]); b. 02/2022 Echo: EF 25-30%.   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   Seizures (HCC)    childhood   Stroke [facility])    Tobacco abuse     Past Surgical History:  Procedure Laterality Date   BIV UPGRADE N/A 05/05/2020   Procedure: BIV ICD UPGRADE;  Surgeon: Duke Salvia, MD;  Location: [facility] INVASIVE CV LAB;  Service: Cardiovascular;  Laterality: N/A;   CORONARY ARTERY BYPASS  GRAFT     INTRAVASCULAR PRESSURE WIRE/FFR STUDY N/A 04/25/2022   Procedure: INTRAVASCULAR PRESSURE WIRE/FFR STUDY;  Surgeon: Yvonne Kendall, MD;  Location: [facility] INVASIVE CV LAB;  Service: Cardiovascular;  Laterality: N/A;   RIGHT/LEFT HEART CATH AND CORONARY/GRAFT ANGIOGRAPHY N/A 04/25/2022   Procedure: RIGHT/LEFT HEART CATH AND CORONARY/GRAFT ANGIOGRAPHY;  Surgeon: Yvonne Kendall, MD;  Location: [facility] INVASIVE CV LAB;  Service: Cardiovascular;  Laterality: N/A;     Allergies  Allergies  Allergen Reactions   Iodinated Contrast Media Shortness Of Breath   Penicillins Anaphylaxis and Shortness Of Breath    Respiratory  Tolerated cefuroxime on 01/06/16   Strawberry Extract Anaphylaxis   Cefepime Itching    Empiric antibiotic, developed pruritis.    Erythromycin Itching   Sulfa Antibiotics Itching, Nausea And Vomiting and Nausea Only    History of Present Illness    Carl [name] is a 61 y.o. male with a history of CAD s/p CABG, ICM, HFrEF, VT/VF s/p CRT-D, HTN, HL, PAF, tob abuse, COPD, CKD II-III, stroke, and bipolar d/o.  He previously went RCA stenting and then in 2017, he required CABG x2 with a LIMA to the first diagonal and vein graft to the second obtuse marginal.  In the setting of persistent LV dysfunction, ischemic cardiomyopathy, and left bundle branch block, he underwent CRT-D in October 2021.  Most recent echo in August 2023 showed an EF of  25 to 30% with mild concentric LVH, normal RV function, severely the left atrium, and mild MR/AI.  He was hospitalized in August 2023 with dyspnea and heart failure requiring intravenous Lasix.  He was readmitted in September due to COPD exacerbation with readmission a day later secondary to COVID-19 infection, non-STEMI (troponin 1500), paroxysmal atrial fibrillation, hypertension, acute kidney injury, and CHF requiring diuresis.  During that admission, he initially required holding of beta-blocker and ACE inhibitor therapy but we were able to  resume low-dose beta-blocker and he also required digoxin for elevated A-fib rates.  Following adequate diuresis and recovered from COVID, he was discharged back to assisted living.  At office follow-up October 6, he complained of persistent dyspnea and his Lasix was increased to 60 mg daily.  Both beta-blocker and ACE inhibitor discontinued secondary to relative hypotension.  He was readmitted October 16 with dyspnea and lower extremity edema.  In the ER, he was tachycardic with mild troponin elevation of 168.  He was treated with Solu-Medrol and DuoNebs.  Device interrogation was notable for VT that degenerated to VF resulting in ICD shock of which, the patient was unaware.  He was seen by electrophysiology and was placed on amiodarone therapy with recommendation for diagnostic catheterization.  This was performed and revealed 2 of 2 patent grafts with moderate but patent RCA grafts and distal RCA bifurcation disease up to 85% with normal iFR.  Medical therapy was recommended and he was subsequently discharged.  Carl [name] was in his usual state of health on November 15, when he says he was sitting in his wheelchair outside at the assisted living facility, smoking a cigarette with some friends.  He had sudden onset of mild headache and lightheadedness and the next thing he knew, he had slumped out of his wheelchair and onto the ground, falling forward.  He does not remember coming out of his wheelchair.  He apparently regained consciousness relatively quickly and his friends asked him if he was okay.  He denies chest pain or dyspnea leading up to or following this event.  He went inside and alerted the nurse.  Our office received notification through [facility] [employer] that patient had episode of VT and VF with 35 J shock delivered and conversion to sinus rhythm/BiV pacing.  It was noted that OptiVol recordings were still well above euvolemic threshold.  Nursing staff at facility was contacted and patient was  taken to the ED.  Here, he was found to have a potassium of 2.7 with normal magnesium of 1.9.  Troponin mildly elevated up to 83 and now downtrending.  TSH incidentally noted to be higher than previous values 11.393.  He has had no recurrent events on monitoring overnight.  He denies any chest pain or dyspnea this morning.  Inpatient Medications     amiodarone  400 mg Oral BID   apixaban  5 mg Oral BID   dapagliflozin propanediol  10 mg Oral Daily   divalproex  500 mg Oral BID   escitalopram  10 mg Oral QHS   ezetimibe  10 mg Oral Daily   furosemide  60 mg Oral Daily   hydrOXYzine  10 mg Oral BID   insulin aspart  0-15 Units Subcutaneous TID WC   insulin aspart  0-5 Units Subcutaneous QHS   levETIRAcetam  250 mg Oral BID   levothyroxine  25 mcg Oral Q0600   losartan  12.5 mg Oral Daily   metoprolol succinate  12.5 mg Oral QHS  multivitamin with minerals  1 tablet Oral Daily   pantoprazole  40 mg Oral Daily   potassium chloride  40 mEq Oral BID   QUEtiapine  200 mg Oral QHS   rosuvastatin  10 mg Oral Daily   sodium chloride flush  3 mL Intravenous Q12H   [START ON 05/24/2022] tiotropium  18 mcg Inhalation Daily    Family History    Family History  Problem Relation Age of Onset   Cancer Mother        "femal cancer"   Prostate cancer Father    He indicated that the status of his mother is unknown. He indicated that the status of his father is unknown.   Social History    Social History   Socioeconomic History   Marital status: Widowed    Spouse name: Not on file   Number of children: Not on file   Years of education: Not on file   Highest education level: Not on file  Occupational History   Not on file  Tobacco Use   Smoking status: Every Day    Packs/day: 0.25    Years: 0.00    Total pack years: 0.00    Types: Cigarettes   Smokeless tobacco: Never  Vaping Use   Vaping Use: Never used  Substance and Sexual Activity   Alcohol use: Not Currently   Drug use:  Never   Sexual activity: Not Currently    Birth control/protection: Abstinence  Other Topics Concern   Not on file  Social History Narrative   Not on file   Social Determinants of Health   Financial Resource Strain: Not on file  Food Insecurity: No Food Insecurity (05/23/2022)   Hunger Vital Sign    Worried About Running Out of Food in the Last Year: Never true    Ran Out of Food in the Last Year: Never true  Transportation Needs: No Transportation Needs (05/23/2022)   PRAPARE - Administrator, Civil Service (Medical): No    Lack of Transportation (Non-Medical): No  Physical Activity: Not on file  Stress: Not on file  Social Connections: Not on file  Intimate Partner Violence: Not At Risk (05/23/2022)   Humiliation, Afraid, Rape, and Kick questionnaire    Fear of Current or Ex-Partner: No    Emotionally Abused: No    Physically Abused: No    Sexually Abused: No     Review of Systems    General:  No chills, fever, night sweats or weight changes.  Cardiovascular:  +++ Presyncope and syncope.  No chest pain, +++ chronic dyspnea on exertion, no edema, orthopnea, palpitations, paroxysmal nocturnal dyspnea. Dermatological: No rash, lesions/masses Respiratory: No cough, +++ dyspnea Urologic: No hematuria, dysuria Abdominal:   No nausea, vomiting, diarrhea, bright red blood per rectum, melena, or hematemesis Neurologic:  No visual changes, wkns, changes in mental status. All other systems reviewed and are otherwise negative except as noted above.  Physical Exam    Blood pressure 111/71, pulse 73, temperature 98.2 F (36.8 C), temperature source Oral, resp. rate 18, height 5\' 9"  (1.753 m), weight 83.2 kg, SpO2 98 %.  General: Pleasant, NAD Psych: Normal affect.  Somewhat tangential when answering questions. Neuro: Alert and oriented X 3. Moves all extremities spontaneously. HEENT: Normal  Neck: Supple without bruits or JVD. Lungs:  Resp regular and unlabored,  bibasilar crackles.  Diminished breath sounds. Heart: RRR no s3, s4, 2/6 murmur loudest at the upper sternal borders but heard throughout. Abdomen: Soft,  non-tender, non-distended, BS + x 4.  Extremities: No clubbing, cyanosis or edema. DP/PT2+, Radials 2+ and equal bilaterally.  Labs    Cardiac Enzymes Recent Labs  Lab 05/22/22 1720 05/22/22 2329 05/23/22 0120 05/23/22 0529  TROPONINIHS 78* 76* 83* 68*     BNP    Component Value Date/Time   BNP 878.6 (H) 04/21/2022 2356    Lab Results  Component Value Date   WBC 7.1 05/23/2022   HGB 12.2 (L) 05/23/2022   HCT 38.0 (L) 05/23/2022   MCV 87.4 05/23/2022   PLT 205 05/23/2022    Recent Labs  Lab 05/23/22 0120  NA 138  K 3.3*  CL 103  CO2 27  BUN 13  CREATININE 1.23  CALCIUM 9.0  PROT 6.4*  BILITOT 0.6  ALKPHOS 44  ALT 9  AST 17  GLUCOSE 113*   Lab Results  Component Value Date   CHOL 132 03/28/2022   HDL 44 03/28/2022   LDLCALC 67 03/28/2022   TRIG 107 03/28/2022   Lab Results  Component Value Date   DDIMER 1.30 (H) 04/22/2022    Radiology Studies    CT Head Wo Contrast  Result Date: 05/22/2022 CLINICAL DATA:  Fall to the floor this morning, possibly related to a pacemaker discharge. Headache. History of stroke. EXAM: CT HEAD WITHOUT CONTRAST CT CERVICAL SPINE WITHOUT CONTRAST TECHNIQUE: Multidetector CT imaging of the head and cervical spine was performed following the standard protocol without intravenous contrast. Multiplanar CT image reconstructions of the cervical spine were also generated. RADIATION DOSE REDUCTION: This exam was performed according to the departmental dose-optimization program which includes automated exposure control, adjustment of the mA and/or kV according to patient size and/or use of iterative reconstruction technique. COMPARISON:  04/22/2022 FINDINGS: CT HEAD FINDINGS Brain: Stable appearance of large region left temporal, occipital, and parietal lobe encephalomalacia from prior  stroke. Periventricular white matter and corona radiata hypodensities favor chronic ischemic microvascular white matter disease. Otherwise, the brainstem, cerebellum, cerebral peduncles, thalamus, basal ganglia, basilar cisterns, and ventricular system appear within normal limits. No intracranial hemorrhage, mass lesion, or acute CVA. Vascular: Atheromatous calcification of the bilateral vertebral arteries and of the cavernous carotid arteries. Skull: Unremarkable Sinuses/Orbits: Complete opacification of the visualized portion of the left maxillary sinus. Mucous retention cyst in the right maxillary sinus. Left mastoid effusion along the petrous apex, suspicious for left petrous apicitis. Other: No supplemental non-categorized findings. CT CERVICAL SPINE FINDINGS Alignment: No subluxation. There is mild levoconvex cervical scoliosis mild ting to about 15 degrees between C2 and T1. Skull base and vertebrae: No fracture or acute bony findings. Soft tissues and spinal canal: Bilateral common carotid atherosclerotic vascular calcification. Disc levels: Uncinate and facet spurring cause osseous foraminal stenosis on the left at C6-7 and C7-T1, and on the right at C6-7. Upper chest: Unremarkable Other: No supplemental non-categorized findings. IMPRESSION: 1. No acute intracranial findings or acute cervical spine findings. 2. Left mastoid effusion along the petrous apex, suspicious for left petrous apicitis. Complete opacification of the visualized portion of the left maxillary sinus, cannot exclude acute left maxillary sinusitis. Chronic right maxillary sinusitis. 3. Stable appearance of large region of left temporal, occipital, and parietal lobe encephalomalacia from prior stroke. 4. Periventricular white matter and corona radiata hypodensities favor chronic ischemic microvascular white matter disease. 5. Cervical spondylosis and degenerative disc disease causing osseous foraminal stenosis at C6-7 and C7-T1. 6. Mild  levoconvex cervical scoliosis. 7. Atherosclerosis. Electronically Signed   By: Annitta NeedsWalter  Liebkemann M.D.  On: 05/22/2022 18:15   CT Cervical Spine Wo Contrast  Result Date: 05/22/2022 CLINICAL DATA:  Fall to the floor this morning, possibly related to a pacemaker discharge. Headache. History of stroke. EXAM: CT HEAD WITHOUT CONTRAST CT CERVICAL SPINE WITHOUT CONTRAST TECHNIQUE: Multidetector CT imaging of the head and cervical spine was performed following the standard protocol without intravenous contrast. Multiplanar CT image reconstructions of the cervical spine were also generated. RADIATION DOSE REDUCTION: This exam was performed according to the departmental dose-optimization program which includes automated exposure control, adjustment of the mA and/or kV according to patient size and/or use of iterative reconstruction technique. COMPARISON:  04/22/2022 FINDINGS: CT HEAD FINDINGS Brain: Stable appearance of large region left temporal, occipital, and parietal lobe encephalomalacia from prior stroke. Periventricular white matter and corona radiata hypodensities favor chronic ischemic microvascular white matter disease. Otherwise, the brainstem, cerebellum, cerebral peduncles, thalamus, basal ganglia, basilar cisterns, and ventricular system appear within normal limits. No intracranial hemorrhage, mass lesion, or acute CVA. Vascular: Atheromatous calcification of the bilateral vertebral arteries and of the cavernous carotid arteries. Skull: Unremarkable Sinuses/Orbits: Complete opacification of the visualized portion of the left maxillary sinus. Mucous retention cyst in the right maxillary sinus. Left mastoid effusion along the petrous apex, suspicious for left petrous apicitis. Other: No supplemental non-categorized findings. CT CERVICAL SPINE FINDINGS Alignment: No subluxation. There is mild levoconvex cervical scoliosis mild ting to about 15 degrees between C2 and T1. Skull base and vertebrae: No fracture  or acute bony findings. Soft tissues and spinal canal: Bilateral common carotid atherosclerotic vascular calcification. Disc levels: Uncinate and facet spurring cause osseous foraminal stenosis on the left at C6-7 and C7-T1, and on the right at C6-7. Upper chest: Unremarkable Other: No supplemental non-categorized findings. IMPRESSION: 1. No acute intracranial findings or acute cervical spine findings. 2. Left mastoid effusion along the petrous apex, suspicious for left petrous apicitis. Complete opacification of the visualized portion of the left maxillary sinus, cannot exclude acute left maxillary sinusitis. Chronic right maxillary sinusitis. 3. Stable appearance of large region of left temporal, occipital, and parietal lobe encephalomalacia from prior stroke. 4. Periventricular white matter and corona radiata hypodensities favor chronic ischemic microvascular white matter disease. 5. Cervical spondylosis and degenerative disc disease causing osseous foraminal stenosis at C6-7 and C7-T1. 6. Mild levoconvex cervical scoliosis. 7. Atherosclerosis. Electronically Signed   By: Gaylyn Rong M.D.   On: 05/22/2022 18:15   DG Chest 2 View  Result Date: 05/22/2022 CLINICAL DATA:  Chest pain EXAM: CHEST - 2 VIEW COMPARISON:  04/22/2022 FINDINGS: Post sternotomy changes and valve prosthesis. Left-sided multi lead pacing device as before. Cardiomegaly with aortic atherosclerosis. No acute airspace disease, pleural effusion, or pneumothorax. Fixating hardware in the left clavicle. IMPRESSION: No active cardiopulmonary disease. Cardiomegaly. Electronically Signed   By: Jasmine Pang M.D.   On: 05/22/2022 17:57    ECG & Cardiac Imaging    V paced at 67 with underlying sinus rhythm- personally reviewed.  Assessment & Plan    1.  Syncope with VT/VF arrest: Patient admitted October with heart failure and finding of VT/VF requiring ICD shock.  At that time, he was placed on amiodarone and underwent diagnostic  catheterization revealing 2 of 2 patent grafts, patent RCA stents with 85% distal RCA bifurcational disease and normal iFR, with recommendation for medical therapy.  On November 15, he was sitting outside his assisted living facility in his wheelchair, smoking a cigarette, when he had sudden onset of presyncope and headache and subsequently had  a syncopal spell, falling forward out of his chair.  This was witnessed by several other residents.  He apparently regained consciousness relatively quickly and had no residual symptoms.  Our office was contacted by [facility] [employer] as patient had VT/VF with appropriate ICD shock of 35 J and conversion to sinus rhythm/V pacing.  Facility staff was notified and patient presented to the ED.  Here, he is noted to be hypokalemic at 2.7 with a normal magnesium (1.9).  Troponin mildly elevated to a peak of 83.  No recurrent ventricular arrhythmias.  He denies chest pain or dyspnea at this time.  He has received potassium and magnesium supplementation and potassium still only 3.3 early this morning.  We will add potassium chloride 40 mEq twice daily with plan to follow-up repeat level today.  We will also reload amiodarone at 400 mg twice daily (was taking 40 mg once daily).  In the setting of ongoing markedly elevated OptiVol levels, will provide IV diuresis for the time being.  Consider reevaluation of diagnostic catheterization films from October with interventional team.  2.  Chronic heart failure with reduced ejection fraction/ischemic cardiomyopathy: EF 25 to 30% by echo in August 2023.  He has had elevated OptiVol reading since October 5 and recent diagnostic catheterization October showed markedly elevated filling pressures.  He has been taking Lasix 60 mg daily at home as prescribed.  He does not appear to be markedly volume overloaded on examination and chest x-ray does not show any active cardiopulmonary disease though OptiVol readings yesterday remained elevated.   He is likely low output and will benefit from evaluation from our advanced heart failure team.  Will transition to IV Lasix for the time being as worsening heart failure is likely contributing to arrhythmias.  Follow electrolytes closely in the setting of diuresis.  Continue low-dose beta-blocker, SGLT2 inhibitor, and ARB.  Titration of GDMT has been limited by relative hypotension.  Will consider spironolactone as pressure allows.  3.  Coronary artery disease/demand ischemia: Status post recent catheterization October 2023 revealing 2 of 2 patent grafts with moderate RCA in-stent restenosis and distal bifurcation disease with normal iFR.  Patient denies experiencing chest pain recently.  He has chronic dyspnea on exertion.  In the setting of ICD shock, troponin mildly elevated to a peak of 83 and is already downtrending.  He remains on beta-blocker, statin, and Zetia therapy.  I will add low-dose long-acting nitrate therapy, and will need to watch pressures closely.  Consider reevaluation of prior cath films by interventional cardiology.  4.  Essential hypertension: Stable with prior history of hypotension.  5.  Hyperlipidemia: Continue statin and Zetia therapy.  LDL 67 in September.  6.  Paroxysmal atrial fibrillation: Maintaining sinus rhythm.  Anticoagulated with Eliquis.  Now on Amio in the setting of VT/VF.  7.  Stage III chronic kidney disease: Stable.  Follow-up with diuresis.  8.  Ongoing tobacco abuse/COPD: Cessation advised.  9.  Bipolar disorder: Per medicine team.  10.  Hypokalemia: See #1.  Supplementation ordered.  Risk Assessment/Risk Scores:        New York Heart Association (NYHA) Functional Class NYHA Class III  CHA2DS2-VASc Score = 5   This indicates a 7.2% annual risk of stroke. The patient's score is based upon: CHF History: 1 HTN History: 1 Diabetes History: 0 Stroke History: 2 Vascular Disease History: 1 Age Score: 0 Gender Score: 0      Signed, Nicolasa Ducking, NP 05/23/2022, 8:58 AM  For questions or  updates, please contact   Please consult [email or web address] for contact info under Cardiology/STEMI.

## 2022-05-23 NOTE — ED Notes (Signed)
Pt place on [place] 2L, pt sating in high 80s when sleeping

## 2022-05-24 DIAGNOSIS — I4901 Ventricular fibrillation: Secondary | ICD-10-CM

## 2022-05-24 DIAGNOSIS — I509 Heart failure, unspecified: Secondary | ICD-10-CM

## 2022-05-24 DIAGNOSIS — I480 Paroxysmal atrial fibrillation: Secondary | ICD-10-CM | POA: Diagnosis not present

## 2022-05-24 DIAGNOSIS — I5022 Chronic systolic (congestive) heart failure: Secondary | ICD-10-CM

## 2022-05-24 DIAGNOSIS — Z4502 Encounter for adjustment and management of automatic implantable cardiac defibrillator: Secondary | ICD-10-CM | POA: Diagnosis not present

## 2022-05-24 LAB — BASIC METABOLIC PANEL
Anion gap: 10 (ref 5–15)
BUN: 21 mg/dL (ref 8–23)
CO2: 28 mmol/L (ref 22–32)
Calcium: 9.5 mg/dL (ref 8.9–10.3)
Chloride: 99 mmol/L (ref 98–111)
Creatinine, Ser: 1.12 mg/dL (ref 0.61–1.24)
GFR, Estimated: >60 mL/min (ref >60)
Glucose, Bld: 110 mg/dL — ABNORMAL HIGH (ref 70–99)
Potassium: 4 mmol/L (ref 3.5–5.1)
Sodium: 137 mmol/L (ref 135–145)

## 2022-05-24 LAB — CBC
HCT: 38.4 % — ABNORMAL LOW (ref 39.0–52.0)
Hemoglobin: 12.5 g/dL — ABNORMAL LOW (ref 13.0–17.0)
MCH: 28.3 pg (ref 26.0–34.0)
MCHC: 32.6 g/dL (ref 30.0–36.0)
MCV: 87.1 fL (ref 80.0–100.0)
Platelets: 217 10*3/uL (ref 150–400)
RBC: 4.41 MIL/uL (ref 4.22–5.81)
RDW: 17.5 % — ABNORMAL HIGH (ref 11.5–15.5)
WBC: 8.5 10*3/uL (ref 4.0–10.5)
nRBC: 0 % (ref 0.0–0.2)

## 2022-05-24 LAB — GLUCOSE, CAPILLARY
Glucose-Capillary: 118 mg/dL — ABNORMAL HIGH (ref 70–99)
Glucose-Capillary: 142 mg/dL — ABNORMAL HIGH (ref 70–99)
Glucose-Capillary: 109 mg/dL — ABNORMAL HIGH (ref 70–99)
Glucose-Capillary: 140 mg/dL — ABNORMAL HIGH (ref 70–99)

## 2022-05-24 LAB — MAGNESIUM: Magnesium: 2.1 mg/dL (ref 1.7–2.4)

## 2022-05-24 LAB — PHOSPHORUS: Phosphorus: 3.8 mg/dL (ref 2.5–4.6)

## 2022-05-24 MED ORDER — FUROSEMIDE 10 MG/ML IJ SOLN
60.0000 mg | Freq: Two times a day (BID) | INTRAMUSCULAR | Status: DC
  Administered 2022-05-24 – 2022-05-25 (×2): 60 mg via INTRAVENOUS
  Filled 2022-05-24 (×2): qty 6

## 2022-05-24 MED ORDER — SPIRONOLACTONE 25 MG PO TABS
25.0000 mg | ORAL_TABLET | Freq: Every day | ORAL | Status: DC
  Administered 2022-05-25: 25 mg via ORAL
  Filled 2022-05-24: qty 1

## 2022-05-24 NOTE — Evaluation (Signed)
Physical Therapy Evaluation Patient Details Name: Carl [name] MRN: [id number] DOB: Nov 08, 1960 Today's Date: 05/24/2022  History of Present Illness  Carl [name] is a 61yoM who comes to [facility] on 10/16 from [name] c SOB. PMH: COPD, CHF, BPD, PAF, seizure DO, CVA. Prior to his arrival to the hospital yesterday, the office was notified of a ventricular fibrillation event requiring ICD shock.   Clinical Impression  Pt admitted with above diagnosis. Pt received supine in bed agreeable to PT services. Pt resting on 2 L/min and agreeable to PT services. Pt is an unclear historian and is unsure if pt is from ALF or group home. Reports at baseline mod-I with RW and use of w/c for mobility. Indep to mod-I with  ADL's/IADL's.   To date, pt indep with bed mobility and STS transfer to RW. Pt amb > 200' with RW with reciprocal gait and overall good stability. Pt maintains on 2 L/min with Spo2 > 90% post ambulation. Pt returns to recliner with all needs in reach. Pt at baseline function with VSS. All needs in reach with no acute PT needs noted. PT to sign off.       Recommendations for follow up therapy are one component of a multi-disciplinary discharge planning process, led by the attending physician.  Recommendations may be updated based on patient status, additional functional criteria and insurance authorization.  Follow Up Recommendations No PT follow up      Assistance Recommended at Discharge Frequent or constant Supervision/Assistance  Patient can return home with the following  A little help with walking and/or transfers;Assist for transportation;Assistance with cooking/housework;Help with stairs or ramp for entrance    Equipment Recommendations None recommended by PT  Recommendations for Other Services       Functional Status Assessment Patient has had a recent decline in their functional status and demonstrates the ability to make significant improvements in function in a  reasonable and predictable amount of time.     Precautions / Restrictions Precautions Precautions: Fall Restrictions Weight Bearing Restrictions: No      Mobility  Bed Mobility Overal bed mobility: Independent               Patient Response: Cooperative  Transfers Overall transfer level: Independent                      Ambulation/Gait Ambulation/Gait assistance: Supervision Gait Distance (Feet): 220 Feet Assistive device: Rolling walker (2 wheels) Gait Pattern/deviations: Step-through pattern       General Gait Details: Consistent step through gait with RW use. Safe use of RW throughout.  Stairs            Wheelchair Mobility    Modified Rankin (Stroke Patients Only)       Balance Overall balance assessment: Needs assistance Sitting-balance support: No upper extremity supported, Feet supported Sitting balance-Leahy Scale: Good Sitting balance - Comments: Able to don shoes in sitting leaning anteriorly     Standing balance-Leahy Scale: Fair Standing balance comment: Maintains standing without UE support                             Pertinent Vitals/Pain Pain Assessment Pain Assessment: No/denies pain    Home Living                     Additional Comments: Assisted living or Group home? pt unable to describe    Prior Function Prior  Level of Function : Patient poor historian/Family not available             Mobility Comments: Per EMR, able to amb with RW, sits in W/c regularly ADLs Comments: Reports mod-I with self care tasks.     Hand Dominance        Extremity/Trunk Assessment   Upper Extremity Assessment Upper Extremity Assessment: Overall WFL for tasks assessed    Lower Extremity Assessment Lower Extremity Assessment: Overall WFL for tasks assessed    Cervical / Trunk Assessment Cervical / Trunk Assessment: Normal  Communication      Cognition Arousal/Alertness: Awake/alert Behavior  During Therapy: WFL for tasks assessed/performed Overall Cognitive Status: Difficult to assess                                 General Comments: Pt is a poor historian        General Comments General comments (skin integrity, edema, etc.): SPO2 > 90% on 2L/min post mobility    Exercises     Assessment/Plan    PT Assessment Patient does not need any further PT services  PT Problem List         PT Treatment Interventions      PT Goals (Current goals can be found in the Care Plan section)  Acute Rehab PT Goals PT Goal Formulation: All assessment and education complete, DC therapy    Frequency       Co-evaluation               AM-PAC PT "6 Clicks" Mobility  Outcome Measure Help needed turning from your back to your side while in a flat bed without using bedrails?: None Help needed moving from lying on your back to sitting on the side of a flat bed without using bedrails?: None Help needed moving to and from a bed to a chair (including a wheelchair)?: None Help needed standing up from a chair using your arms (e.g., wheelchair or bedside chair)?: None Help needed to walk in hospital room?: A Little Help needed climbing 3-5 steps with a railing? : A Little 6 Click Score: 22    End of Session Equipment Utilized During Treatment: Gait belt Activity Tolerance: Patient tolerated treatment well Patient left: in bed;with bed alarm set Nurse Communication: Mobility status      Time: 0240-9735 PT Time Calculation (min) (ACUTE ONLY): 16 min   Charges:   PT Evaluation $PT Eval Low Complexity: 1 Low          Tyrion Glaude M. Fairly IV, PT, DPT Physical Therapist- [place]  [facility]  05/24/2022, 2:49 PM

## 2022-05-24 NOTE — Progress Notes (Signed)
.  vest=34%

## 2022-05-24 NOTE — Progress Notes (Signed)
PROGRESS NOTE   HPI was taken from Dr. Emmit PomfretHugelmeyer: Carl [name] is a 61 y.o. male with a known history of atrial fibrillation on Eliquis, bipolar, CHF, CKD, COPD, depression, hypertension, CAD status post MI and CABG, seizures and CVA presents to the emergency department for evaluation of ICD discharge.  Patient was in a usual state of health at his living facility until 9:43 AM on 05/22/2022 [employer] contacted registered nurse at the patient's living facility to inform that the patient's ICD had discharged due to sustained V. tach/V-fib.  Rhythm was converted there were reportedly 2 nonsustained V. tach episodes recorded prior to the discharge.  Per nursing report the patient had some chest pain prior to the ICD discharge but was unaware of the discharge.  He was referred to the emergency department by cardiology due to the V-fib morphology.     Patient reports that he was smoking a cigarette and then fell out of his wheelchair this morning but denies any pain.  He denies any pain and does not recall being shocked. He does report posterior headaches recently, but not at present.    Patient denies fevers/chills, weakness, dizziness, shortness of breath, N/V/C/D, abdominal pain, dysuria/frequency, changes in mental status.    Otherwise there has been no change in status. Patient has been taking medication as prescribed and there has been no recent change in medication or diet.  No recent antibiotics.  There has been no recent illness, hospitalizations, travel or sick contacts.     EMS/ED Course: Patient received magnesium, potassium. Medical admission has been requested for further management of ICD discharge, moderate hypokalemia   Carl AsalWiley [name]  XBM:[id number] DOB: [birth year] DOA: 05/22/2022 PCP: [facility]  Assessment & Plan:   Principal Problem:   AICD discharge Active Problems:   Acute on chronic combined systolic and diastolic CHF (congestive heart failure)  (HCC)  Assessment and Plan: AICD discharge: amio was increased to 400mg  BID as per cardio. Electrolytes are WNL. Continue on tele. Cardio following and recs apprec  Hypokalemia: WNL today    Mechanical fall: CT head shows no acute intracranial findings but chronic maxillary sinusitis & encephalomalacia from prior CVA. PT consulted    A. fib: likely PAF. Continue on metoprolol, eliquis & increase dose of amio as per cardio    Bipolar disorder: unknown severity or type. Continue on home dose of seroquel, lexapro    Chronic systolic CHF: IV lasix was increased to 60mg  BID x 1 day more, started on aldactone to 25 mg and continue on losartan, statin, metoprolol, farxiga.    Hx of COPD: w/o exacerbation. Continue on bronchodilators     GERD: continue on PPI    Hypothyroidism: continue on home dose of levothyroxine    Seizures: continue on home dose of depakote, keppra   Hx of CVA: poor memory as per pt. Pt is oriented to self only today      DVT prophylaxis:  eliquis  Code Status: full  Family Communication:  Disposition Plan: likely d/c back to home facility   Level of care: Telemetry Cardiac  Status is: Inpatient Remains inpatient appropriate because: severity of illness      Consultants:  cardio  Procedures:  Antimicrobials:   Subjective: Pt c/o fatigue   Objective: Vitals:   05/24/22 0040 05/24/22 0459 05/24/22 0525 05/24/22 0817  BP: 113/63 106/65  105/68  Pulse: 68 66  72  Resp: (!) 24 20  18   Temp: 98.2 F (36.8 C) 98 F (36.7 C)  (!)  97.4 F (36.3 C)  TempSrc:      SpO2: 100% 96%  99%  Weight:   81.7 kg   Height:        Intake/Output Summary (Last 24 hours) at 05/24/2022 0830 Last data filed at 05/23/2022 2246 Gross per 24 hour  Intake 243 ml  Output 2050 ml  Net -1807 ml   Filed Weights   05/22/22 1730 05/24/22 0525  Weight: 83.2 kg 81.7 kg    Examination:  General exam: Appears comfortable  Respiratory system: decreased breath  sounds b/l otherwise clear  Cardiovascular system: S1 & S2 +. No rubs, gallops or clicks. Gastrointestinal system: Abdomen is nondistended, soft and nontender. Normal bowel sounds heard. Central nervous system: Alert and oriented to self only. Moves all extremities  Psychiatry: Judgement and insight appears poor. Flat mood and affect     Data Reviewed: I have personally reviewed following labs and imaging studies  CBC: Recent Labs  Lab 05/22/22 1720 05/23/22 0120 05/24/22 0650  WBC 8.2 7.1 8.5  HGB 12.6* 12.2* 12.5*  HCT 38.6* 38.0* 38.4*  MCV 87.5 87.4 87.1  PLT 229 205 217   Basic Metabolic Panel: Recent Labs  Lab 05/22/22 1720 05/23/22 0120 05/24/22 0650  NA 136 138 137  K 2.7* 3.3* 4.0  CL 100 103 99  CO2 26 27 28   GLUCOSE 117* 113* 110*  BUN 12 13 21   CREATININE 1.37* 1.23 1.12  CALCIUM 8.7* 9.0 9.5  MG 1.9 2.3 2.1  PHOS  --   --  3.8   GFR: Estimated Creatinine Clearance: 67.5 mL/min (by C-G formula based on SCr of 1.12 mg/dL). Liver Function Tests: Recent Labs  Lab 05/23/22 0120  AST 17  ALT 9  ALKPHOS 44  BILITOT 0.6  PROT 6.4*  ALBUMIN 2.9*   No results for input(s): "LIPASE", "AMYLASE" in the last 168 hours. No results for input(s): "AMMONIA" in the last 168 hours. Coagulation Profile: No results for input(s): "INR", "PROTIME" in the last 168 hours. Cardiac Enzymes: No results for input(s): "CKTOTAL", "CKMB", "CKMBINDEX", "TROPONINI" in the last 168 hours. BNP (last 3 results) No results for input(s): "PROBNP" in the last 8760 hours. HbA1C: No results for input(s): "HGBA1C" in the last 72 hours. CBG: Recent Labs  Lab 05/23/22 1637 05/23/22 2018 05/24/22 0818  GLUCAP 143* 142* 118*   Lipid Profile: No results for input(s): "CHOL", "HDL", "LDLCALC", "TRIG", "CHOLHDL", "LDLDIRECT" in the last 72 hours. Thyroid Function Tests: Recent Labs    05/23/22 0120  TSH 11.393*   Anemia Panel: No results for input(s): "VITAMINB12", "FOLATE",  "FERRITIN", "TIBC", "IRON", "RETICCTPCT" in the last 72 hours. Sepsis Labs: No results for input(s): "PROCALCITON", "LATICACIDVEN" in the last 168 hours.  No results found for this or any previous visit (from the past 240 hour(s)).       Radiology Studies: CT Head Wo Contrast  Result Date: 05/22/2022 CLINICAL DATA:  Fall to the floor this morning, possibly related to a pacemaker discharge. Headache. History of stroke. EXAM: CT HEAD WITHOUT CONTRAST CT CERVICAL SPINE WITHOUT CONTRAST TECHNIQUE: Multidetector CT imaging of the head and cervical spine was performed following the standard protocol without intravenous contrast. Multiplanar CT image reconstructions of the cervical spine were also generated. RADIATION DOSE REDUCTION: This exam was performed according to the departmental dose-optimization program which includes automated exposure control, adjustment of the mA and/or kV according to patient size and/or use of iterative reconstruction technique. COMPARISON:  04/22/2022 FINDINGS: CT HEAD FINDINGS Brain: Stable appearance  of large region left temporal, occipital, and parietal lobe encephalomalacia from prior stroke. Periventricular white matter and corona radiata hypodensities favor chronic ischemic microvascular white matter disease. Otherwise, the brainstem, cerebellum, cerebral peduncles, thalamus, basal ganglia, basilar cisterns, and ventricular system appear within normal limits. No intracranial hemorrhage, mass lesion, or acute CVA. Vascular: Atheromatous calcification of the bilateral vertebral arteries and of the cavernous carotid arteries. Skull: Unremarkable Sinuses/Orbits: Complete opacification of the visualized portion of the left maxillary sinus. Mucous retention cyst in the right maxillary sinus. Left mastoid effusion along the petrous apex, suspicious for left petrous apicitis. Other: No supplemental non-categorized findings. CT CERVICAL SPINE FINDINGS Alignment: No subluxation.  There is mild levoconvex cervical scoliosis mild ting to about 15 degrees between C2 and T1. Skull base and vertebrae: No fracture or acute bony findings. Soft tissues and spinal canal: Bilateral common carotid atherosclerotic vascular calcification. Disc levels: Uncinate and facet spurring cause osseous foraminal stenosis on the left at C6-7 and C7-T1, and on the right at C6-7. Upper chest: Unremarkable Other: No supplemental non-categorized findings. IMPRESSION: 1. No acute intracranial findings or acute cervical spine findings. 2. Left mastoid effusion along the petrous apex, suspicious for left petrous apicitis. Complete opacification of the visualized portion of the left maxillary sinus, cannot exclude acute left maxillary sinusitis. Chronic right maxillary sinusitis. 3. Stable appearance of large region of left temporal, occipital, and parietal lobe encephalomalacia from prior stroke. 4. Periventricular white matter and corona radiata hypodensities favor chronic ischemic microvascular white matter disease. 5. Cervical spondylosis and degenerative disc disease causing osseous foraminal stenosis at C6-7 and C7-T1. 6. Mild levoconvex cervical scoliosis. 7. Atherosclerosis. Electronically Signed   By: Gaylyn Rong M.D.   On: 05/22/2022 18:15   CT Cervical Spine Wo Contrast  Result Date: 05/22/2022 CLINICAL DATA:  Fall to the floor this morning, possibly related to a pacemaker discharge. Headache. History of stroke. EXAM: CT HEAD WITHOUT CONTRAST CT CERVICAL SPINE WITHOUT CONTRAST TECHNIQUE: Multidetector CT imaging of the head and cervical spine was performed following the standard protocol without intravenous contrast. Multiplanar CT image reconstructions of the cervical spine were also generated. RADIATION DOSE REDUCTION: This exam was performed according to the departmental dose-optimization program which includes automated exposure control, adjustment of the mA and/or kV according to patient size  and/or use of iterative reconstruction technique. COMPARISON:  04/22/2022 FINDINGS: CT HEAD FINDINGS Brain: Stable appearance of large region left temporal, occipital, and parietal lobe encephalomalacia from prior stroke. Periventricular white matter and corona radiata hypodensities favor chronic ischemic microvascular white matter disease. Otherwise, the brainstem, cerebellum, cerebral peduncles, thalamus, basal ganglia, basilar cisterns, and ventricular system appear within normal limits. No intracranial hemorrhage, mass lesion, or acute CVA. Vascular: Atheromatous calcification of the bilateral vertebral arteries and of the cavernous carotid arteries. Skull: Unremarkable Sinuses/Orbits: Complete opacification of the visualized portion of the left maxillary sinus. Mucous retention cyst in the right maxillary sinus. Left mastoid effusion along the petrous apex, suspicious for left petrous apicitis. Other: No supplemental non-categorized findings. CT CERVICAL SPINE FINDINGS Alignment: No subluxation. There is mild levoconvex cervical scoliosis mild ting to about 15 degrees between C2 and T1. Skull base and vertebrae: No fracture or acute bony findings. Soft tissues and spinal canal: Bilateral common carotid atherosclerotic vascular calcification. Disc levels: Uncinate and facet spurring cause osseous foraminal stenosis on the left at C6-7 and C7-T1, and on the right at C6-7. Upper chest: Unremarkable Other: No supplemental non-categorized findings. IMPRESSION: 1. No acute intracranial findings or acute cervical  spine findings. 2. Left mastoid effusion along the petrous apex, suspicious for left petrous apicitis. Complete opacification of the visualized portion of the left maxillary sinus, cannot exclude acute left maxillary sinusitis. Chronic right maxillary sinusitis. 3. Stable appearance of large region of left temporal, occipital, and parietal lobe encephalomalacia from prior stroke. 4. Periventricular white  matter and corona radiata hypodensities favor chronic ischemic microvascular white matter disease. 5. Cervical spondylosis and degenerative disc disease causing osseous foraminal stenosis at C6-7 and C7-T1. 6. Mild levoconvex cervical scoliosis. 7. Atherosclerosis. Electronically Signed   By: Gaylyn Rong M.D.   On: 05/22/2022 18:15   DG Chest 2 View  Result Date: 05/22/2022 CLINICAL DATA:  Chest pain EXAM: CHEST - 2 VIEW COMPARISON:  04/22/2022 FINDINGS: Post sternotomy changes and valve prosthesis. Left-sided multi lead pacing device as before. Cardiomegaly with aortic atherosclerosis. No acute airspace disease, pleural effusion, or pneumothorax. Fixating hardware in the left clavicle. IMPRESSION: No active cardiopulmonary disease. Cardiomegaly. Electronically Signed   By: Jasmine Pang M.D.   On: 05/22/2022 17:57        Scheduled Meds:  amiodarone  400 mg Oral BID   apixaban  5 mg Oral BID   dapagliflozin propanediol  10 mg Oral Daily   divalproex  500 mg Oral BID   escitalopram  10 mg Oral QHS   ezetimibe  10 mg Oral Daily   furosemide  40 mg Intravenous BID   hydrOXYzine  10 mg Oral BID   insulin aspart  0-15 Units Subcutaneous TID WC   insulin aspart  0-5 Units Subcutaneous QHS   isosorbide mononitrate  15 mg Oral Daily   levETIRAcetam  250 mg Oral BID   levothyroxine  25 mcg Oral Q0600   losartan  12.5 mg Oral Daily   metoprolol succinate  12.5 mg Oral QHS   multivitamin with minerals  1 tablet Oral Daily   pantoprazole  40 mg Oral Daily   potassium chloride  40 mEq Oral BID   QUEtiapine  200 mg Oral QHS   rosuvastatin  10 mg Oral Daily   sodium chloride flush  3 mL Intravenous Q12H   spironolactone  12.5 mg Oral Daily   tiotropium  18 mcg Inhalation Daily   Continuous Infusions:   LOS: 1 day    Time spent: 35 mins    Charise Killian, MD [facility] Pager [phone]  If 7PM-7AM, please contact night-coverage [email or web address] 05/24/2022, 8:30 AM

## 2022-05-24 NOTE — Consult Note (Addendum)
Advanced Heart Failure Team Consult Note   Primary Physician: [facility] PCP-Cardiologist:  Lorine Bears, MD  Reason for Consultation: Heart failure  HPI:    Carl [name] is seen today for evaluation of HF at the request of Ward Givens, NP.   Carl [name] is a 61 y.o. male with a history of CAD s/p CABG, ICM, HFrEF, VT/VF s/p CRT-D, HTN, HL, PAF, tob abuse, COPD, CKD II-III, stroke, and bipolar d/o.    Underwent RCA stenting followed by CABG x2 with a LIMA-D1, SVG-> OM-2. Underwent CRT-D in October 2021.    Echo 8/23 EF 25-30% with mild concentric LVH, normal RV function, severe LAE, and mild MR/AI.  He was hospitalized in August 2023 with ADHF.   He was readmitted in September due to COPD exacerbation with readmission a day later secondary to COVID-19 infection, non-STEMI (troponin 1500), paroxysmal atrial fibrillation, hypertension, acute kidney injury, and CHF requiring diuresis.  During that admission, he initially required holding of beta-blocker and ACE inhibitor therapy but we were able to resume low-dose beta-blocker and he also required digoxin for elevated A-fib rates.  Following adequate diuresis and recovered from COVID, he was discharged back to assisted living.   Seen 04/12/22 in Cardiology Clinic with NYHA III-IIB symptoms. Both beta-blocker and ACE inhibitor discontinued secondary to relative hypotension.  He was readmitted October 16 with ADHF.  In the ER, he was tachycardic with mild troponin elevation of 168.  He was treated with Solu-Medrol and DuoNebs.  Device interrogation was notable for VT that degenerated to VF resulting in ICD shock of which, the patient was unaware.  He was seen by EP and placed on amio with recommendation for catheterization.  Cath performed and revealed 2 of 2 patent grafts with moderate but patent RCA grafts and distal RCA bifurcation disease up to 85% with normal iFR.  Medical therapy was recommended and he was subsequently  discharged.   Carl [name] was in his usual state of health on November 15, when he says he was sitting in his wheelchair outside at the assisted living facility, smoking a cigarette with some friends.  He had sudden onset of mild headache and lightheadedness and the next thing he knew, he had slumped out of his wheelchair and onto the ground, falling forward. ICD interrogation showed  VT and VF with 35 J shock delivered and conversion to sinus rhythm/BiV pacing.  It was noted that OptiVol recordings were still well above euvolemic threshold.  Nursing staff at facility was contacted and patient was taken to the ED.  Here, he was found to have a potassium of 2.7 with normal magnesium of 1.9.  Troponin mildly elevated up to 83  He was admitted. K was supped and amio increased to 400 bid. No further VT on monitor SBP 105-120   He is a poor historian but says he is doing OK. Apparently gets around some with a walker but not very mobile due to leg pain and weakness. Denies CP, orthopnea or PND. When I asked him how much he smokes he said "I try to get at least 4 per day"     Review of Systems: [y] = yes,  = no   General: Weight gain ; Weight loss ; Anorexia ; Fatigue [ y]; Fever ; Chills ; Weakness Cove.Etienne ]  Cardiac: Chest pain/pressure ; Resting SOB ; Exertional SOB Cove.Etienne ]; Orthopnea ; Pedal Edema [ y]; Palpitations ;  Syncope Cove.Etienne[y ]; Presyncope [ ] ; Paroxysmal nocturnal dyspnea[ ]   Pulmonary: Cough [ ] ; Wheezing[ ] ; Hemoptysis[ ] ; Sputum [ ] ; Snoring [ ]   GI: Vomiting[ ] ; Dysphagia[ ] ; Melena[ ] ; Hematochezia [ ] ; Heartburn[ ] ; Abdominal pain [ ] ; Constipation [ ] ; Diarrhea [ ] ; BRBPR [ ]   GU: Hematuria[ ] ; Dysuria [ ] ; Nocturia[ ]   Vascular: Pain in legs with walking [ ] ; Pain in feet with lying flat [ ] ; Non-healing sores [ ] ; Stroke [ ] ; TIA [ ] ; Slurred speech [ ] ;  Neuro: Headaches[ ] ; Vertigo[ ] ; Seizures[ ] ; Paresthesias[ ] ;Blurred vision [ ] ; Diplopia [ ] ; Vision changes [ ]    Ortho/Skin: Arthritis [ y]; Joint pain Cove.Etienne[y ]; Muscle pain [ ] ; Joint swelling [ ] ; Back Pain [ ] ; Rash [ ]   Psych: Depression[ ] ; Anxiety[ ]   Heme: Bleeding problems [ ] ; Clotting disorders [ ] ; Anemia [ ]   Endocrine: Diabetes [ ] ; Thyroid dysfunction[ ]   Home Medications Prior to Admission medications   Medication Sig Start Date End Date Taking? Authorizing Provider  albuterol (PROVENTIL) (2.5 MG/3ML) 0.083% nebulizer solution Take 3 mLs (2.5 mg total) by nebulization every 6 (six) hours as needed for wheezing or shortness of breath. 03/21/22  Yes Lurene ShadowAyiku, Bernard, MD  amiodarone (PACERONE) 400 MG tablet Take 1 tablet (400 mg total) by mouth daily. 05/01/22  Yes Enedina FinnerPatel, Sona, MD  apixaban (ELIQUIS) 5 MG TABS tablet TAKE 1 TABLET BY MOUTH TWICE A DAY 01/18/21  Yes Duke SalviaKlein, Steven C, MD  dapagliflozin propanediol (FARXIGA) 10 MG TABS tablet Take 1 tablet (10 mg total) by mouth daily. 05/01/22  Yes Enedina FinnerPatel, Sona, MD  divalproex (DEPAKOTE ER) 500 MG 24 hr tablet Take 1 tablet (500 mg total) by mouth 2 (two) times daily. 05/24/19  Yes Clapacs, Jackquline DenmarkJohn T, MD  escitalopram (LEXAPRO) 10 MG tablet Take 1 tablet (10 mg total) by mouth at bedtime. 05/24/19  Yes Clapacs, Jackquline DenmarkJohn T, MD  ezetimibe (ZETIA) 10 MG tablet Take 1 tablet (10 mg total) by mouth daily. 04/01/22  Yes Hollice EspyKrishnan, Sendil K, MD  furosemide (LASIX) 20 MG tablet Take 3 tablets (60 mg) by mouth once daily 04/12/22  Yes Furth, Cadence H, PA-C  Hydrocortisone Acetate 1 % CREA Apply TID 05/15/22  Yes [provider]  levETIRAcetam (KEPPRA) 250 MG tablet Take 1 tablet (250 mg total) by mouth 2 (two) times daily. 03/24/20  Yes Minna AntisPaduchowski, Kevin, MD  levothyroxine (SYNTHROID) 25 MCG tablet Take 1 tablet (25 mcg total) by mouth daily. 05/24/19  Yes Clapacs, Jackquline DenmarkJohn T, MD  losartan (COZAAR) 25 MG tablet Take 0.5 tablets (12.5 mg total) by mouth daily. 05/01/22  Yes Enedina FinnerPatel, Sona, MD  metoprolol succinate (TOPROL-XL) 25 MG 24 hr tablet Take 0.5 tablets (12.5 mg  total) by mouth at bedtime. 04/30/22  Yes Enedina FinnerPatel, Sona, MD  Multiple Vitamin (MULTIVITAMIN WITH MINERALS) TABS tablet Take 1 tablet by mouth daily. 05/01/22  Yes Enedina FinnerPatel, Sona, MD  omeprazole (PRILOSEC) 20 MG capsule Take 20 mg by mouth daily.   Yes [provider]  QUEtiapine (SEROQUEL) 200 MG tablet Take 200 mg by mouth at bedtime.   Yes [provider]  rosuvastatin (CRESTOR) 10 MG tablet Take 1 tablet (10 mg total) by mouth daily. 05/16/22  Yes Hackney, Inetta Fermoina A, FNP  tiotropium (SPIRIVA) 18 MCG inhalation capsule Place 18 mcg into inhaler and inhale daily.   Yes [provider]  acetaminophen (TYLENOL) 500 MG tablet Take 500 mg by mouth every 6 (six) hours as needed  for mild pain.    [provider]  albuterol (VENTOLIN HFA) 108 (90 Base) MCG/ACT inhaler Inhale 2 puffs into the lungs every 6 (six) hours as needed for wheezing. 05/24/19 05/22/22  Clapacs, Jackquline Denmark, MD  digoxin (LANOXIN) 0.125 MG tablet Take 0.125 mg by mouth daily. Patient not taking: Reported on 05/22/2022    [provider]  hydrOXYzine (ATARAX) 10 MG tablet Take 10 mg by mouth 2 (two) times daily. 12/24/21   [provider]  predniSONE (DELTASONE) 10 MG tablet Take 10 mg by mouth daily with breakfast. Patient not taking: Reported on 05/22/2022    [provider]  traMADol (ULTRAM) 50 MG tablet Take 1 tablet (50 mg total) by mouth 2 (two) times daily as needed. 03/31/22   Hollice Espy, MD    Past Medical History: Past Medical History:  Diagnosis Date   Acute pulmonary edema (HCC) 2017   Acute respiratory failure with hypoxia (HCC) 2017   Bipolar 1 disorder (HCC)    CAD (coronary artery disease)    a. Prior RCA stenting; b. 2017 s/p CABG x 2 (LIMA->D1, VG->OM2); b. 04/2022 Demand Isch/Cath: LM mild dzs, LAD 76m, D1 100, LCX mod diff dzs, OM2 100, OM3 50, RCA 40/20p ISR, 85d (iFR 0.99), 50d, RPAV 60, LIMA->D1 small, patent, VG->OM2 large, mild dzs-->Med Rx.    Chronic HFrEF (heart failure with reduced ejection fraction) (HCC)    a. 02/2022 Echo: EF 25-30%, mild conc LVH, nl RV fxn, sev dil LA, mildly dil RA, mild MR/AI.   CKD (chronic kidney disease), stage III (HCC)    COPD (chronic obstructive pulmonary disease) (HCC)    Depression    Hypertension    Ischemic cardiomyopathy    a. 04/2020 s/p MDT MRI compatible CRT-D (Ser # [id number]); b. 02/2022 Echo: EF 25-30%.   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   Seizures (HCC)    childhood   Stroke [facility])    Tobacco abuse     Past Surgical History: Past Surgical History:  Procedure Laterality Date   BIV UPGRADE N/A 05/05/2020   Procedure: BIV ICD UPGRADE;  Surgeon: Duke Salvia, MD;  Location: [facility] INVASIVE CV LAB;  Service: Cardiovascular;  Laterality: N/A;   CORONARY ARTERY BYPASS GRAFT     INTRAVASCULAR PRESSURE WIRE/FFR STUDY N/A 04/25/2022   Procedure: INTRAVASCULAR PRESSURE WIRE/FFR STUDY;  Surgeon: Yvonne Kendall, MD;  Location: [facility] INVASIVE CV LAB;  Service: Cardiovascular;  Laterality: N/A;   RIGHT/LEFT HEART CATH AND CORONARY/GRAFT ANGIOGRAPHY N/A 04/25/2022   Procedure: RIGHT/LEFT HEART CATH AND CORONARY/GRAFT ANGIOGRAPHY;  Surgeon: Yvonne Kendall, MD;  Location: [facility] INVASIVE CV LAB;  Service: Cardiovascular;  Laterality: N/A;    Family History: Family History  Problem Relation Age of Onset   Cancer Mother        "femal cancer"   Prostate cancer Father     Social History: Social History   Socioeconomic History   Marital status: Widowed    Spouse name: Not on file   Number of children: Not on file   Years of education: Not on file   Highest education level: Not on file  Occupational History   Not on file  Tobacco Use   Smoking status: Every Day    Packs/day: 0.25    Years: 0.00    Total pack years: 0.00    Types: Cigarettes   Smokeless tobacco: Never  Vaping Use   Vaping Use: Never used  Substance and Sexual Activity   Alcohol use: Not Currently   Drug use: Never   Sexual activity: Not Currently    Birth control/protection: Abstinence  Other Topics Concern   Not on file  Social History Narrative   Not on file   Social Determinants of Health   Financial Resource Strain: Not on file  Food Insecurity: No Food Insecurity (05/23/2022)   Hunger Vital Sign    Worried About Running Out of Food in the Last Year: Never true    Ran Out of Food in the Last Year: Never true  Transportation Needs: No Transportation Needs (05/23/2022)   PRAPARE - Administrator, Civil Service (Medical): No    Lack of Transportation (Non-Medical): No  Physical Activity: Not on file  Stress: Not on file  Social Connections: Not on file    Allergies:  Allergies  Allergen Reactions   Iodinated Contrast Media Shortness Of Breath   Penicillins Anaphylaxis and Shortness Of Breath    Respiratory  Tolerated cefuroxime on 01/06/16   Strawberry Extract Anaphylaxis   Cefepime Itching    Empiric antibiotic, developed pruritis.    Erythromycin Itching   Sulfa Antibiotics Itching, Nausea And Vomiting and Nausea Only    Objective:    Vital Signs:   Temp:  [97.4 F (36.3 C)-98.2 F (36.8 C)] 97.4 F (36.3 C) (11/17 0817) Pulse Rate:  [66-88] 72 (11/17 0817) Resp:  [18-24] 18 (11/17 0817) BP: (105-129)/(62-74) 105/68 (11/17 0817) SpO2:  [95 %-100 %] 99 % (11/17 0817) Weight:  [81.7 kg] 81.7 kg (11/17 0525) Last BM Date : 05/22/22  Weight change: Filed Weights   05/22/22 1730 05/24/22 0525  Weight: 83.2 kg 81.7 kg    Intake/Output:   Intake/Output Summary (Last 24 hours) at 05/24/2022 0834 Last data filed at 05/23/2022 2246 Gross per 24 hour  Intake 243 ml  Output 2050 ml  Net -1807 ml      Physical Exam    General:  Chronically ill appearing. Pale No resp difficulty HEENT: normal Neck: supple. JVP 10 . Carotids 2+ bilat; no  bruits. No lymphadenopathy or thyromegaly appreciated. Cor: PMI laterally displaced. Regular rate & rhythm. No rubs, gallops or murmurs. Lungs: clear Abdomen: soft, nontender, nondistended. No hepatosplenomegaly. No bruits or masses. Good bowel sounds. Extremities: no cyanosis, clubbing, rash, trace edema Neuro: alert & orientedx3, cranial nerves grossly intact. moves all 4 extremities w/o difficulty. Affect pleasant  EKG    Sinus BiV pacing 70 Personally reviewed  Labs   Basic Metabolic Panel: Recent Labs  Lab 05/22/22 1720 05/23/22 0120 05/24/22 0650  NA 136 138 137  K 2.7* 3.3* 4.0  CL 100 103 99  CO2 26 27 28   GLUCOSE 117* 113* 110*  BUN 12 13 21   CREATININE 1.37* 1.23 1.12  CALCIUM 8.7* 9.0 9.5  MG 1.9 2.3 2.1  PHOS  --   --  3.8    Liver Function Tests: Recent Labs  Lab 05/23/22 0120  AST 17  ALT 9  ALKPHOS 44  BILITOT 0.6  PROT 6.4*  ALBUMIN 2.9*   No results for input(s): "LIPASE", "AMYLASE" in the last 168 hours. No results for input(s): "AMMONIA" in the last 168 hours.  CBC: Recent Labs  Lab 05/22/22 1720 05/23/22 0120 05/24/22 0650  WBC 8.2 7.1 8.5  HGB 12.6* 12.2* 12.5*  HCT 38.6* 38.0* 38.4*  MCV 87.5 87.4 87.1  PLT 229 205 217  Cardiac Enzymes: No results for input(s): "CKTOTAL", "CKMB", "CKMBINDEX", "TROPONINI" in the last 168 hours.  BNP: BNP (last 3 results) Recent Labs    03/22/22 0725 04/12/22 1223 04/21/22 2356  BNP 689.8* 968.8* 878.6*    ProBNP (last 3 results) No results for input(s): "PROBNP" in the last 8760 hours.   CBG: Recent Labs  Lab 05/23/22 1637 05/23/22 2018 05/24/22 0818  GLUCAP 143* 142* 118*    Coagulation Studies: No results for input(s): "LABPROT", "INR" in the last 72 hours.   Imaging   No results found.   Medications:     Current Medications:  amiodarone  400 mg Oral BID   apixaban  5 mg Oral BID   dapagliflozin propanediol  10 mg Oral Daily   divalproex  500 mg Oral BID    escitalopram  10 mg Oral QHS   ezetimibe  10 mg Oral Daily   furosemide  40 mg Intravenous BID   hydrOXYzine  10 mg Oral BID   insulin aspart  0-15 Units Subcutaneous TID WC   insulin aspart  0-5 Units Subcutaneous QHS   isosorbide mononitrate  15 mg Oral Daily   levETIRAcetam  250 mg Oral BID   levothyroxine  25 mcg Oral Q0600   losartan  12.5 mg Oral Daily   metoprolol succinate  12.5 mg Oral QHS   multivitamin with minerals  1 tablet Oral Daily   pantoprazole  40 mg Oral Daily   potassium chloride  40 mEq Oral BID   QUEtiapine  200 mg Oral QHS   rosuvastatin  10 mg Oral Daily   sodium chloride flush  3 mL Intravenous Q12H   spironolactone  12.5 mg Oral Daily   tiotropium  18 mcg Inhalation Daily    Infusions:      Assessment/Plan     1.  VT/VF arrest with ICD shock: - Previous VT/VF  in 10/23, Cath at time with stable CAD - K 2.7 at time - Continue K supplementation Keep K > 4.0 Mg > 2.0  - agree with amio re-load - suspect scar-mediated VT vs end-stage CM  2.  Acute on  Chronic heart failure with reduced ejection fraction/ischemic cardiomyopathy:  - EF 25 to 30% by echo in August 2023.   - He has had elevated OptiVol reading since October 5 and recent diagnostic catheterization October showed markedly elevated filling pressures.   - He is responding to IV lasix  - ReDS today showed 34% lung water (normal range 20-35%) so suspect Optivol may be artificially elevated - Will increase lasix to 60 IV bid and continue one more day - Agree that he is likely nearing end-stage but currently not candidate for advanced therapies with tobacco use, immobility and poor insight so I am not sure that there is currently a role for RHC and not able to complete CPX testing - Continue Farxiga 10 - Increase spiro to 25 - Continue losartan 12.5 - Continue Toprol 12.5 daily as tolerated - Will attempt to follow closely in HF Clinic post d/c if he can get transportation (I will ask  clinic to schedule) - Cardiomems could be a consideration if struggling with volume overload persistently   3.  Coronary artery disease/demand ischemia: Status post recent catheterization October 2023 revealing 2 of 2 patent grafts with moderate RCA in-stent restenosis and distal bifurcation disease with normal iFR.  Patient denies experiencing chest pain recently.  - no CP currently - continue medical management   4.  Paroxysmal atrial  fibrillation: Maintaining sinus rhythm.  Anticoagulated with Eliquis.  Now on Amio in the setting of VT/VF  5.  Ongoing tobacco abuse/COPD: Cessation advised. But he is uninterested.    6.  Bipolar disorder: Per medicine team.   7.  Hypokalemia: See #1.  K 4.0 today   Length of Stay: 1  Arvilla Meres, MD  05/24/2022, 8:34 AM  Advanced Heart Failure Team Pager [phone] (M-F; 7a - 5p)  Please contact [facility] for night-coverage after hours (4p -7a ) and weekends on [email or web address]

## 2022-05-25 DIAGNOSIS — E7800 Pure hypercholesterolemia, unspecified: Secondary | ICD-10-CM

## 2022-05-25 DIAGNOSIS — I472 Ventricular tachycardia, unspecified: Secondary | ICD-10-CM

## 2022-05-25 DIAGNOSIS — I10 Essential (primary) hypertension: Secondary | ICD-10-CM

## 2022-05-25 DIAGNOSIS — E663 Overweight: Secondary | ICD-10-CM

## 2022-05-25 DIAGNOSIS — E876 Hypokalemia: Secondary | ICD-10-CM | POA: Diagnosis not present

## 2022-05-25 DIAGNOSIS — F319 Bipolar disorder, unspecified: Secondary | ICD-10-CM

## 2022-05-25 DIAGNOSIS — Z87898 Personal history of other specified conditions: Secondary | ICD-10-CM

## 2022-05-25 DIAGNOSIS — Z4502 Encounter for adjustment and management of automatic implantable cardiac defibrillator: Secondary | ICD-10-CM | POA: Diagnosis not present

## 2022-05-25 DIAGNOSIS — I5043 Acute on chronic combined systolic (congestive) and diastolic (congestive) heart failure: Secondary | ICD-10-CM | POA: Diagnosis not present

## 2022-05-25 DIAGNOSIS — I5042 Chronic combined systolic (congestive) and diastolic (congestive) heart failure: Secondary | ICD-10-CM

## 2022-05-25 DIAGNOSIS — I2510 Atherosclerotic heart disease of native coronary artery without angina pectoris: Secondary | ICD-10-CM

## 2022-05-25 LAB — BASIC METABOLIC PANEL
Anion gap: 10 (ref 5–15)
BUN: 28 mg/dL — ABNORMAL HIGH (ref 8–23)
CO2: 27 mmol/L (ref 22–32)
Calcium: 10.2 mg/dL (ref 8.9–10.3)
Chloride: 100 mmol/L (ref 98–111)
Creatinine, Ser: 1.23 mg/dL (ref 0.61–1.24)
GFR, Estimated: >60 mL/min (ref >60)
Glucose, Bld: 141 mg/dL — ABNORMAL HIGH (ref 70–99)
Potassium: 4.3 mmol/L (ref 3.5–5.1)
Sodium: 137 mmol/L (ref 135–145)

## 2022-05-25 LAB — PHOSPHORUS: Phosphorus: 5 mg/dL — ABNORMAL HIGH (ref 2.5–4.6)

## 2022-05-25 LAB — MAGNESIUM: Magnesium: 2.2 mg/dL (ref 1.7–2.4)

## 2022-05-25 LAB — GLUCOSE, CAPILLARY
Glucose-Capillary: 123 mg/dL — ABNORMAL HIGH (ref 70–99)
Glucose-Capillary: 149 mg/dL — ABNORMAL HIGH (ref 70–99)
Glucose-Capillary: 103 mg/dL — ABNORMAL HIGH (ref 70–99)

## 2022-05-25 LAB — CBC
HCT: 38.9 % — ABNORMAL LOW (ref 39.0–52.0)
Hemoglobin: 12.8 g/dL — ABNORMAL LOW (ref 13.0–17.0)
MCH: 28.6 pg (ref 26.0–34.0)
MCHC: 32.9 g/dL (ref 30.0–36.0)
MCV: 86.8 fL (ref 80.0–100.0)
Platelets: 235 10*3/uL (ref 150–400)
RBC: 4.48 MIL/uL (ref 4.22–5.81)
RDW: 17.2 % — ABNORMAL HIGH (ref 11.5–15.5)
WBC: 7.3 10*3/uL (ref 4.0–10.5)
nRBC: 0 % (ref 0.0–0.2)

## 2022-05-25 MED ORDER — FUROSEMIDE 20 MG PO TABS: 60.0000 mg | ORAL_TABLET | Freq: Two times a day (BID) | ORAL | 0 refills | Status: DC

## 2022-05-25 MED ORDER — ALBUTEROL SULFATE HFA 108 (90 BASE) MCG/ACT IN AERS: 2.0000 | INHALATION_SPRAY | Freq: Four times a day (QID) | RESPIRATORY_TRACT | 0 refills | Status: DC | PRN

## 2022-05-25 MED ORDER — AMIODARONE HCL 400 MG PO TABS: ORAL_TABLET | ORAL | 0 refills | Status: DC

## 2022-05-25 MED ORDER — POTASSIUM CHLORIDE CRYS ER 20 MEQ PO TBCR: 20.0000 meq | EXTENDED_RELEASE_TABLET | Freq: Every day | ORAL | 0 refills | Status: DC

## 2022-05-25 MED ORDER — SPIRONOLACTONE 25 MG PO TABS: 25.0000 mg | ORAL_TABLET | Freq: Every day | ORAL | 0 refills | Status: DC

## 2022-05-25 MED ORDER — FUROSEMIDE 40 MG PO TABS: 60.0000 mg | ORAL_TABLET | Freq: Two times a day (BID) | ORAL | Status: DC

## 2022-05-25 MED ORDER — HYDROCODONE-ACETAMINOPHEN 5-325 MG PO TABS: 1.0000 | ORAL_TABLET | Freq: Four times a day (QID) | ORAL | 0 refills | Status: DC | PRN

## 2022-05-25 MED ORDER — ISOSORBIDE MONONITRATE ER 30 MG PO TB24: 15.0000 mg | ORAL_TABLET | Freq: Every day | ORAL | 0 refills | Status: DC

## 2022-05-25 MED ORDER — NITROGLYCERIN 0.4 MG SL SUBL: 0.4000 mg | SUBLINGUAL_TABLET | SUBLINGUAL | 0 refills | Status: DC | PRN

## 2022-05-25 MED ORDER — ALBUTEROL SULFATE (2.5 MG/3ML) 0.083% IN NEBU: 2.5000 mg | INHALATION_SOLUTION | Freq: Four times a day (QID) | RESPIRATORY_TRACT | 0 refills | Status: DC | PRN

## 2022-05-25 MED ORDER — POTASSIUM CHLORIDE CRYS ER 20 MEQ PO TBCR: 40.0000 meq | EXTENDED_RELEASE_TABLET | Freq: Two times a day (BID) | ORAL | 0 refills | Status: DC

## 2022-05-25 NOTE — Assessment & Plan Note (Signed)
Continue Depakote and Keppra.  

## 2022-05-25 NOTE — Assessment & Plan Note (Signed)
Continue Seroquel and Lexapro

## 2022-05-25 NOTE — Progress Notes (Signed)
Pt discharged back to the facility in stable condition. Discharge instructions provided to guardian Candace and placed in discharge packet for facility. Scripts sent to pharmacy of choice.

## 2022-05-25 NOTE — TOC Transition Note (Addendum)
Transition of Care [facility]) - CM/SW Discharge Note   Patient Details  Name: Houa Ackert MRN: [id number] Date of Birth: 1961/05/03  [facility] CM/SW Contact:  Darolyn Rua, LCSW Phone Number: 05/25/2022, 1:16 PM   Clinical Narrative:     Patient to discharge home to [facility], Doristine Mango to arrange pick up via private vehicle. Wheelchair delivered to room via [employer]. Patient does not require O2 at discharge. FL2 and dc summary in dc packet for facility. Facility will pick patient up at 4:30 pm, RN made aware. Guardian [name] informed of dc today. No other needs at this time.   Final next level of care: Assisted Living Barriers to Discharge: No Barriers Identified   Patient Goals and CMS Choice Patient states their goals for this hospitalization and ongoing recovery are:: to go home CMS Medicare.gov Compare Post Acute Care list provided to:: Patient Choice offered to / list presented to : Patient  Discharge Placement                       Discharge Plan and Services                DME Arranged: Wheelchair manual DME Agency: [employer] Date DME Agency Contacted: 05/25/22 Time DME Agency Contacted: [phone]              Social Determinants of Health (SDOH) Interventions     Readmission Risk Interventions    03/15/2022    2:44 PM  Readmission Risk Prevention Plan  HRI or Home Care Consult Complete  Social Work Consult for Recovery Care Planning/Counseling Complete  Palliative Care Screening Not Applicable  Medication Review [employer]) Complete

## 2022-05-25 NOTE — Hospital Course (Addendum)
Carl [name] is a 61 y.o. male with a known history of atrial fibrillation on Eliquis, bipolar, CHF, CKD, COPD, depression, hypertension, CAD status post MI and CABG, seizures and CVA presents to the emergency department for evaluation of ICD discharge.  Patient was in a usual state of health at his living facility until 9:43 AM on 05/22/2022 [employer] contacted registered nurse at the patient's living facility to inform that the patient's ICD had discharged due to sustained V. tach/V-fib.  Rhythm was converted there were reportedly 2 nonsustained V. tach episodes recorded prior to the discharge.  Per nursing report the patient had some chest pain prior to the ICD discharge but was unaware of the discharge.  He was referred to the emergency department by cardiology due to the V-fib morphology.     Patient reports that he was smoking a cigarette and then fell out of his wheelchair this morning but denies any pain.  He denies any pain and does not recall being shocked. He does report posterior headaches recently, but not at present.    Patient denies fevers/chills, weakness, dizziness, shortness of breath, N/V/C/D, abdominal pain, dysuria/frequency, changes in mental status.    Otherwise there has been no change in status. Patient has been taking medication as prescribed and there has been no recent change in medication or diet.  No recent antibiotics.  There has been no recent illness, hospitalizations, travel or sick contacts.     EMS/ED Course: Patient received magnesium, potassium. Medical admission has been requested for further management of ICD discharge, moderate hypokalemia  Cardiology increased his amiodarone to 400 mg twice a day while in the hospital and will continue the increased dose for another 4 days then can decrease down to 400 mg daily.  Cardiology adjusted some medications for heart failure.  Recommend checking a BMP and follow-up appointment.  Refer to heart failure clinic.  Cardiology  cleared to discharge on 05/25/2022.  Recommend checking a BMP with follow-up appointment because we may be able to get rid of the potassium since spironolactone was started.  Recommend daily weights.

## 2022-05-25 NOTE — Assessment & Plan Note (Signed)
Continue levothyroxine 

## 2022-05-25 NOTE — TOC Initial Note (Addendum)
Transition of Care [facility]) - Initial/Assessment Note    Patient Details  Name: Carl [name] MRN: [id number] Date of Birth: 19-Nov-1960  Transition of Care [facility]) CM/SW Contact:    Tiburcio Bash, LCSW Phone Number: 05/25/2022, 9:56 AM  Clinical Narrative:                  Update: wheel chair ordered via [facility] with [employer]. Clement at [facility] confirms patient can return over weekend and they can pick him up, are aware he will be returning with a wheel chair.   CSW met with patient at bedisde, he confirms living at [facility] and plans to return at discharge. Reports he has been using the wheelchairs at facility but does not have his own, is requesting one be ordered and be delivered to his hospital room prior to dc.   Patient reports no other needs at this time, reports [name] should pick him up at dc.   CSW has requested dme wheel chair orders, once in will order from [employer].   Expected Discharge Plan: Assisted Living Barriers to Discharge: Continued Medical Work up   Patient Goals and CMS Choice Patient states their goals for this hospitalization and ongoing recovery are:: to go home CMS Medicare.gov Compare Post Acute Care list provided to:: Patient Choice offered to / list presented to : Patient  Expected Discharge Plan and Services Expected Discharge Plan: Assisted Living       Living arrangements for the past 2 months: [facility] ([facility])                 DME Arranged: Programmer, multimedia DME Agency: [employer] Date DME Agency Contacted: 05/25/22 Time DME Agency Contacted: [phone]              Prior Living Arrangements/Services Living arrangements for the past 2 months: [facility] [employer] [facility]) Lives with:: Facility Resident                   Activities of Daily Living Home Assistive Devices/Equipment: Wheelchair ADL Screening (condition at time of admission) Patient's cognitive ability adequate  to safely complete daily activities?: No Is the patient deaf or have difficulty hearing?: No Does the patient have difficulty seeing, even when wearing glasses/contacts?: No Does the patient have difficulty concentrating, remembering, or making decisions?: Yes Patient able to express need for assistance with ADLs?: Yes Does the patient have difficulty dressing or bathing?: Yes Independently performs ADLs?: No Communication: Independent Dressing (OT): Needs assistance Is this a change from baseline?: Pre-admission baseline Grooming: Needs assistance Is this a change from baseline?: Pre-admission baseline Feeding: Independent Bathing: Needs assistance Is this a change from baseline?: Pre-admission baseline Toileting: Needs assistance Is this a change from baseline?: Pre-admission baseline In/Out Bed: Needs assistance Is this a change from baseline?: Pre-admission baseline Walks in Home: Needs assistance Is this a change from baseline?: Pre-admission baseline Does the patient have difficulty walking or climbing stairs?: Yes Weakness of Legs: Both Weakness of Arms/Hands: None  Permission Sought/Granted                  Emotional Assessment       Orientation: : Oriented to Self, Oriented to Place, Oriented to  Time, Oriented to Situation Alcohol / Substance Use: Not Applicable Psych Involvement: No (comment)  Admission diagnosis:  Hypokalemia [E87.6] Defibrillator discharge [Z45.02] AICD discharge [Z45.02] Patient Active Problem List   Diagnosis Date Noted   AICD discharge 04/27/2022  Non-ST elevation (NSTEMI) myocardial infarction [facility])    Ventricular fibrillation (HCC)    Ventricular tachycardia ([place]) 04/23/2022   Acute respiratory failure with hypoxia (HCC) 04/22/2022   Hypokalemia 04/22/2022   Demand ischemia    Type 2 diabetes mellitus with hyperglycemia (HCC) 03/25/2022   Overweight (BMI 25.0-29.9) 03/23/2022   COVID-19 virus infection 03/23/2022    Respiratory failure ([place]) 03/22/2022   Dilated cardiomyopathy ([place])    Chest pain    COPD exacerbation ([place]) 03/14/2022   Seizure disorder ([place]) 03/14/2022   Dyspnea    Congestive heart failure ([place]) 02/15/2022   Anxiety 02/15/2022   Tobacco use 02/15/2022   Elevated partial thromboplastin time (PTT) 02/15/2022   Abdominal pain 02/15/2022   HFrEF (heart failure with reduced ejection fraction) ([place])    Weakness    COPD with acute exacerbation ([place]) 05/23/2021   Paroxysmal atrial fibrillation ([place]) 05/23/2021   Chronic anticoagulation 05/23/2021   Abdominal aortic aneurysm (AAA) 35 to 39 mm in diameter ([place]) 05/23/2021   Ischemic cardiomyopathy 83/33/8329   Chronic systolic heart failure ([place]) 05/22/2020   HCAP (healthcare-associated pneumonia) 01/24/2020   Hyperkalemia 01/24/2020   Acute on chronic combined systolic and diastolic CHF (congestive heart failure) ([place]) 01/24/2020   History of seizure 01/24/2020   HLD (hyperlipidemia) 01/24/2020   Ascites due to alcoholic cirrhosis ([place])    Acute on chronic HFrEF (heart failure with reduced ejection fraction) ([place]) 11/18/2019   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/18/2019   Elevated troponin 11/18/2019   Hypoxia 11/18/2019   Coronary artery disease 05/15/2019   History of stroke 05/15/2019   Homicidal ideation 05/15/2019   Hypothyroidism 05/15/2019   Constipation 05/06/2019   Scrotal mass 05/06/2019   Abnormal urine odor 05/06/2019   Bipolar 1 disorder ([place]) 12/23/2018   Chronic pain of left knee 09/17/2018   Atypical chest pain 10/11/2017   Polypharmacy 08/05/2017   Cerebrovascular accident (CVA) due to stenosis of left middle cerebral artery ([place]) 06/26/2017   Urinary retention due to benign prostatic hyperplasia 06/22/2017   Falls 06/20/2017   Risk for falls 02/19/2017   Dyslipidemia 11/19/2016   Peripheral artery disease ([place]) 08/28/2016   Claudication ([place]) 08/09/2016   History of alcohol abuse  08/09/2016   History of drug dependence/abuse ([place]) 08/09/2016   Severe single current episode of major depressive disorder, without psychotic features ([place]) 08/09/2016   Mitral insufficiency 01/04/2016   Prediabetes 12/29/2015   Essential hypertension 12/29/2015   Fracture of left clavicle 07/13/2012   PCP:  [facility]:   [place], [place] - [address] [place] [address] Phone: [phone] Fax: [phone]     Social Determinants of Health (SDOH) Interventions    Readmission Risk Interventions    03/15/2022    2:44 PM  Readmission Risk Prevention Plan  HRI or [place] Complete  Social Work Consult for [place] Planning/Counseling Complete  Palliative Care Screening Not Applicable  Medication Review Press photographer) Complete

## 2022-05-25 NOTE — NC FL2 (Signed)
Carl [name] LEVEL OF CARE SCREENING TOOL     IDENTIFICATION  Patient Name: Carl [name] Birthdate: [birth year] Sex: male Admission Date (Current Location): 05/22/2022  [facility] and [place] Number:  Engineering geologist and Address:  [facility], [address] [place], [place] [address]      Provider Number: [id number]  Attending Physician Name and Address:  Loletha Grayer, MD  Relative Name and Phone Number:  [name] (legal [phone]    Current Level of Care: Hospital Recommended Level of Care: [place] [name]) Prior Approval Number:    Date Approved/Denied:   PASRR Number:    Discharge Plan: Other (Comment) (Assisted Living)    Current Diagnoses: Patient Active Problem List   Diagnosis Date Noted   AICD discharge 04/27/2022   Non-ST elevation (NSTEMI) myocardial infarction [facility])    Ventricular fibrillation ([place])    Ventricular tachycardia ([place]) 04/23/2022   Acute respiratory failure with hypoxia ([place]) 04/22/2022   Hypokalemia 04/22/2022   Demand ischemia    Type 2 diabetes mellitus with hyperglycemia ([place]) 03/25/2022   Overweight (BMI 25.0-29.9) 03/23/2022   COVID-19 virus infection 03/23/2022   Respiratory failure ([place]) 03/22/2022   Dilated cardiomyopathy ([place])    Chest pain    COPD exacerbation ([place]) 03/14/2022   Seizure disorder ([employer]) 03/14/2022   Dyspnea    Congestive heart failure ([place]) 02/15/2022   Anxiety 02/15/2022   Tobacco use 02/15/2022   Elevated partial thromboplastin time (PTT) 02/15/2022   Abdominal pain 02/15/2022   HFrEF (heart failure with reduced ejection fraction) (HCC)    Weakness    COPD with acute exacerbation ([place]) 05/23/2021   Paroxysmal atrial fibrillation ([place]) 05/23/2021   Chronic anticoagulation 05/23/2021   Abdominal aortic aneurysm (AAA) 35 to 39 mm in diameter ([place]) 05/23/2021   Ischemic cardiomyopathy 05/22/2020   Chronic combined systolic and  diastolic heart failure ([place]) 05/22/2020   HCAP (healthcare-associated pneumonia) 01/24/2020   Hyperkalemia 01/24/2020   Acute on chronic combined systolic and diastolic CHF (congestive heart failure) ([place]) 01/24/2020   History of seizure 01/24/2020   HLD (hyperlipidemia) 01/24/2020   Ascites due to alcoholic cirrhosis ([place])    Acute on chronic HFrEF (heart failure with reduced ejection fraction) ([place]) 11/18/2019   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/18/2019   Elevated troponin 11/18/2019   Hypoxia 11/18/2019   CAD in native artery 05/15/2019   History of stroke 05/15/2019   Homicidal ideation 05/15/2019   Hypothyroidism 05/15/2019   Constipation 05/06/2019   Scrotal mass 05/06/2019   Abnormal urine odor 05/06/2019   Bipolar 1 disorder ([place]) 12/23/2018   Chronic pain of left knee 09/17/2018   Atypical chest pain 10/11/2017   Polypharmacy 08/05/2017   Cerebrovascular accident (CVA) due to stenosis of left middle cerebral artery ([place]) 06/26/2017   Urinary retention due to benign prostatic hyperplasia 06/22/2017   Falls 06/20/2017   Risk for falls 02/19/2017   Dyslipidemia 11/19/2016   Peripheral artery disease ([place]) 08/28/2016   Claudication ([place]) 08/09/2016   History of alcohol abuse 08/09/2016   History of drug dependence/abuse ([place]) 08/09/2016   Severe single current episode of major depressive disorder, without psychotic features ([place]) 08/09/2016   Mitral insufficiency 01/04/2016   Prediabetes 12/29/2015   Essential hypertension 12/29/2015   Fracture of left clavicle 07/13/2012    Orientation RESPIRATION BLADDER Height & Weight     Self, Time, Situation  Normal Continent Weight: 180 lb 1.6 oz (81.7 kg) Height:  5\' 9"  (175.3 cm)  BEHAVIORAL SYMPTOMS/MOOD  NEUROLOGICAL BOWEL NUTRITION STATUS      Continent Diet (cardiac)  AMBULATORY STATUS COMMUNICATION OF NEEDS Skin   Limited Assist Verbally Normal                       Personal Care  Assistance Level of Assistance  Bathing, Feeding, Dressing, Total care Bathing Assistance: Limited assistance Feeding assistance: Independent Dressing Assistance: Limited assistance Total Care Assistance: Limited assistance   Functional Limitations Info  Sight, Hearing, Speech Sight Info: Adequate Hearing Info: Adequate Speech Info: Adequate    SPECIAL CARE FACTORS FREQUENCY                       Contractures Contractures Info: Not present    Additional Factors Info  Code Status, Allergies Code Status Info: full Allergies Info: Iodinated Contrast Media  Penicillins  Strawberry Extract  Cefepime  Erythromycin  Sulfa Antibiotics             Discharge Medications:  acetaminophen 500 MG tablet Commonly known as: TYLENOL Take 500 mg by mouth every 6 (six) hours as needed for mild pain.    albuterol 108 (90 Base) MCG/ACT inhaler Commonly known as: VENTOLIN HFA Inhale 2 puffs into the lungs every 6 (six) hours as needed for wheezing.    albuterol (2.5 MG/3ML) 0.083% nebulizer solution Commonly known as: PROVENTIL Take 3 mLs (2.5 mg total) by nebulization every 6 (six) hours as needed for wheezing or shortness of breath.    amiodarone 400 MG tablet Commonly known as: PACERONE One tab (400mg ) po twice a day for four more days then once a day after that What changed:  how much to take how to take this when to take this additional instructions    dapagliflozin propanediol 10 MG Tabs tablet Commonly known as: FARXIGA Take 1 tablet (10 mg total) by mouth daily.    divalproex 500 MG 24 hr tablet Commonly known as: DEPAKOTE ER Take 1 tablet (500 mg total) by mouth 2 (two) times daily.    Eliquis 5 MG Tabs tablet Generic drug: apixaban TAKE 1 TABLET BY MOUTH TWICE A DAY    escitalopram 10 MG tablet Commonly known as: LEXAPRO Take 1 tablet (10 mg total) by mouth at bedtime.    ezetimibe 10 MG tablet Commonly known as: ZETIA Take 1 tablet (10 mg total) by  mouth daily.    furosemide 20 MG tablet Commonly known as: LASIX Take 3 tablets (60 mg total) by mouth 2 (two) times daily. What changed:  how much to take how to take this when to take this additional instructions    HYDROcodone-acetaminophen 5-325 MG tablet Commonly known as: NORCO/VICODIN Take 1 tablet by mouth every 6 (six) hours as needed for severe pain.    Hydrocortisone Acetate 1 % Crea Apply TID    hydrOXYzine 10 MG tablet Commonly known as: ATARAX Take 10 mg by mouth 2 (two) times daily.    isosorbide mononitrate 30 MG 24 hr tablet Commonly known as: IMDUR Take 0.5 tablets (15 mg total) by mouth daily. Start taking on: May 26, 2022    levETIRAcetam 250 MG tablet Commonly known as: Keppra Take 1 tablet (250 mg total) by mouth 2 (two) times daily.    levothyroxine 25 MCG tablet Commonly known as: SYNTHROID Take 1 tablet (25 mcg total) by mouth daily.    losartan 25 MG tablet Commonly known as: COZAAR Take 0.5 tablets (12.5 mg total) by mouth daily.  metoprolol succinate 25 MG 24 hr tablet Commonly known as: TOPROL-XL Take 0.5 tablets (12.5 mg total) by mouth at bedtime.    multivitamin with minerals Tabs tablet Take 1 tablet by mouth daily.    nitroGLYCERIN 0.4 MG SL tablet Commonly known as: NITROSTAT Place 1 tablet (0.4 mg total) under the tongue every 5 (five) minutes as needed for chest pain.    omeprazole 20 MG capsule Commonly known as: PRILOSEC Take 20 mg by mouth daily.    potassium chloride SA 20 MEQ tablet Commonly known as: KLOR-CON M Take 1 tablet (20 mEq total) by mouth daily.    QUEtiapine 200 MG tablet Commonly known as: SEROQUEL Take 200 mg by mouth at bedtime.    rosuvastatin 10 MG tablet Commonly known as: CRESTOR Take 1 tablet (10 mg total) by mouth daily.    spironolactone 25 MG tablet Commonly known as: ALDACTONE Take 1 tablet (25 mg total) by mouth daily. Start taking on: May 26, 2022    tiotropium 18 MCG  inhalation capsule Commonly known as: SPIRIVA Place 18 mcg into inhaler and inhale daily.     Relevant Imaging Results:  Relevant Lab Results:   Additional [place], LCSW

## 2022-05-25 NOTE — Progress Notes (Signed)
Mobility Specialist - Progress Note    During mobility: SpO2 (93,94, 95) Post-mobility: SPO2 (95)     05/25/22 1200  Mobility  Activity Stood at bedside;Ambulated with assistance in hallway  Level of Assistance Standby assist, set-up cues, supervision of patient - no hands on  Assistive Device None  Distance Ambulated (ft) 320 ft  Activity Response Tolerated well  Mobility Referral Yes  $Mobility charge 1 Mobility    Pt laying supine in bed on 2L upon entry. Pt STS and ambulates in hallway SBA. Pt lightly held onto raling during ambulation. Pt RN asked for oxygen test during first lap. Pt had a x1 instability toward the end of the session, but regained balance and returned to bed. Pt left on RA with needs in reach.   Johnathan Hausen Mobility Specialist 05/25/22, 1:08 PM

## 2022-05-25 NOTE — Discharge Summary (Signed)
Physician Discharge Summary   Patient: Carl [name] MRN: [id number] DOB: 1961/03/31  Admit date:     05/22/2022  Discharge date: 05/25/22  Discharge Physician: Loletha Grayer   PCP: Orvis Brill, [facility]   Recommendations at discharge:   Follow-up with Drs. Making house calls and findings Follow-up CHF clinic Follow-up [facility] cardiology with appointment on the 28th. Recommend checking a basic metabolic panel in 5 days to see if we can get rid of the potassium supplementation.  Discharge Diagnoses: Principal Problem:   AICD discharge Active Problems:   Ventricular tachycardia (HCC)   Acute on chronic combined systolic and diastolic CHF (congestive heart failure) (HCC)   Hypokalemia   Hypothyroidism   Bipolar 1 disorder (HCC)   Paroxysmal atrial fibrillation (HCC)   CAD in native artery   History of seizure   Overweight (BMI 25.0-29.9)   Chronic combined systolic and diastolic heart failure [facility])   Hospital Course: Carl [name] is a 61 y.o. male with a known history of atrial fibrillation on Eliquis, bipolar, CHF, CKD, COPD, depression, hypertension, CAD status post MI and CABG, seizures and CVA presents to the emergency department for evaluation of ICD discharge.  Patient was in a usual state of health at his living facility until 9:43 AM on 05/22/2022 [employer] contacted registered nurse at the patient's living facility to inform that the patient's ICD had discharged due to sustained V. tach/V-fib.  Rhythm was converted there were reportedly 2 nonsustained V. tach episodes recorded prior to the discharge.  Per nursing report the patient had some chest pain prior to the ICD discharge but was unaware of the discharge.  He was referred to the emergency department by cardiology due to the V-fib morphology.     Patient reports that he was smoking a cigarette and then fell out of his wheelchair this morning but denies any pain.  He denies any pain and does not recall being shocked.  He does report posterior headaches recently, but not at present.    Patient denies fevers/chills, weakness, dizziness, shortness of breath, N/V/C/D, abdominal pain, dysuria/frequency, changes in mental status.    Otherwise there has been no change in status. Patient has been taking medication as prescribed and there has been no recent change in medication or diet.  No recent antibiotics.  There has been no recent illness, hospitalizations, travel or sick contacts.     EMS/ED Course: Patient received magnesium, potassium. Medical admission has been requested for further management of ICD discharge, moderate hypokalemia  Cardiology increased his amiodarone to 400 mg twice a day while in the hospital and will continue the increased dose for another 4 days then can decrease down to 400 mg daily.  Cardiology adjusted some medications for heart failure.  Recommend checking a BMP and follow-up appointment.  Refer to heart failure clinic.  Cardiology cleared to discharge on 05/25/2022.  Recommend checking a BMP with follow-up appointment because we may be able to get rid of the potassium since spironolactone was started.  Recommend daily weights.  Assessment and Plan: * AICD discharge Cardiology increase amiodarone to 400 mg twice daily for total of 1 week (4 more days upon discharge) and then can go back to 400 mg daily after that.  Ventricular tachycardia ([place]) Continue increased dose of amiodarone for another 4 days upon discharge and then back to usual dose of 400 mg daily.  Acute on chronic combined systolic and diastolic CHF (congestive heart failure) ([place]) Patient was diuresed with IV Lasix and then increased over  to 60 mg twice a day of oral Lasix.  Patient on low-dose Toprol, low-dose losartan, spironolactone added.  Continue Farxiga.  Recommend checking a BMP with follow-up appointment because with the addition of Aldactone should be able to get rid of the potassium.  Ejection fraction on  02/16/2022 25%  Hypokalemia Replaced during the hospital course.  Recommend checking BMP in 5 days.  May be able to get rid of potassium as outpatient with the starting of spironolactone.  Hypothyroidism Continue levothyroxine  Bipolar 1 disorder (HCC) Continue Seroquel and Lexapro  Paroxysmal atrial fibrillation (HCC) Continue metoprolol and increased dose of amiodarone and Eliquis for anticoagulation.  History of seizure Continue Depakote and Keppra  Overweight (BMI 25.0-29.9) BMI 26.6         Consultants: Cardiology Procedures performed: None Disposition: Back to his assisted living Diet recommendation:  Cardiac diet DISCHARGE MEDICATION: Allergies as of 05/25/2022       Reactions   Iodinated Contrast Media Shortness Of Breath   Penicillins Anaphylaxis, Shortness Of Breath   Respiratory  Tolerated cefuroxime on 01/06/16   Strawberry Extract Anaphylaxis   Cefepime Itching   Empiric antibiotic, developed pruritis.    Erythromycin Itching   Sulfa Antibiotics Itching, Nausea And Vomiting, Nausea Only        Medication List     STOP taking these medications    digoxin 0.125 MG tablet Commonly known as: LANOXIN   predniSONE 10 MG tablet Commonly known as: DELTASONE   traMADol 50 MG tablet Commonly known as: ULTRAM       TAKE these medications    acetaminophen 500 MG tablet Commonly known as: TYLENOL Take 500 mg by mouth every 6 (six) hours as needed for mild pain.   albuterol 108 (90 Base) MCG/ACT inhaler Commonly known as: VENTOLIN HFA Inhale 2 puffs into the lungs every 6 (six) hours as needed for wheezing.   albuterol (2.5 MG/3ML) 0.083% nebulizer solution Commonly known as: PROVENTIL Take 3 mLs (2.5 mg total) by nebulization every 6 (six) hours as needed for wheezing or shortness of breath.   amiodarone 400 MG tablet Commonly known as: PACERONE One tab (400mg ) po twice a day for four more days then once a day after that What changed:   how much to take how to take this when to take this additional instructions   dapagliflozin propanediol 10 MG Tabs tablet Commonly known as: FARXIGA Take 1 tablet (10 mg total) by mouth daily.   divalproex 500 MG 24 hr tablet Commonly known as: DEPAKOTE ER Take 1 tablet (500 mg total) by mouth 2 (two) times daily.   Eliquis 5 MG Tabs tablet Generic drug: apixaban TAKE 1 TABLET BY MOUTH TWICE A DAY   escitalopram 10 MG tablet Commonly known as: LEXAPRO Take 1 tablet (10 mg total) by mouth at bedtime.   ezetimibe 10 MG tablet Commonly known as: ZETIA Take 1 tablet (10 mg total) by mouth daily.   furosemide 20 MG tablet Commonly known as: LASIX Take 3 tablets (60 mg total) by mouth 2 (two) times daily. What changed:  how much to take how to take this when to take this additional instructions   HYDROcodone-acetaminophen 5-325 MG tablet Commonly known as: NORCO/VICODIN Take 1 tablet by mouth every 6 (six) hours as needed for severe pain.   Hydrocortisone Acetate 1 % Crea Apply TID   hydrOXYzine 10 MG tablet Commonly known as: ATARAX Take 10 mg by mouth 2 (two) times daily.   isosorbide mononitrate 30 MG  24 hr tablet Commonly known as: IMDUR Take 0.5 tablets (15 mg total) by mouth daily. Start taking on: May 26, 2022   levETIRAcetam 250 MG tablet Commonly known as: Keppra Take 1 tablet (250 mg total) by mouth 2 (two) times daily.   levothyroxine 25 MCG tablet Commonly known as: SYNTHROID Take 1 tablet (25 mcg total) by mouth daily.   losartan 25 MG tablet Commonly known as: COZAAR Take 0.5 tablets (12.5 mg total) by mouth daily.   metoprolol succinate 25 MG 24 hr tablet Commonly known as: TOPROL-XL Take 0.5 tablets (12.5 mg total) by mouth at bedtime.   multivitamin with minerals Tabs tablet Take 1 tablet by mouth daily.   nitroGLYCERIN 0.4 MG SL tablet Commonly known as: NITROSTAT Place 1 tablet (0.4 mg total) under the tongue every 5 (five)  minutes as needed for chest pain.   omeprazole 20 MG capsule Commonly known as: PRILOSEC Take 20 mg by mouth daily.   potassium chloride SA 20 MEQ tablet Commonly known as: KLOR-CON M Take 1 tablet (20 mEq total) by mouth daily.   QUEtiapine 200 MG tablet Commonly known as: SEROQUEL Take 200 mg by mouth at bedtime.   rosuvastatin 10 MG tablet Commonly known as: CRESTOR Take 1 tablet (10 mg total) by mouth daily.   spironolactone 25 MG tablet Commonly known as: ALDACTONE Take 1 tablet (25 mg total) by mouth daily. Start taking on: May 26, 2022   tiotropium 18 MCG inhalation capsule Commonly known as: SPIRIVA Place 18 mcg into inhaler and inhale daily.               Durable Medical Equipment  (From admission, onward)           Start     Ordered   05/25/22 1147  For home use only DME standard manual wheelchair with seat cushion  Once       Comments: Patient suffers from stroke and heartfailure which impairs their ability to perform daily activities like toileting in the home.  A walker will not resolve issue with performing activities of daily living. A wheelchair will allow patient to safely perform daily activities. Patient can safely propel the wheelchair in the home or has a caregiver who can provide assistance. Length of need Lifetime. Accessories: elevating leg rests (ELRs), wheel locks, extensions and anti-tippers.   05/25/22 1147   05/25/22 0959  For home use only DME wheelchair cushion (seat and back)  Once        05/25/22 0958            Follow-up Information     [facility] Follow up in 5 day(s).   Specialty: Geriatric Medicine Contact information: [place] [address] [phone]         Minna Merritts, MD Follow up.   Specialty: Cardiology Why: Keep appointment on the 28th Contact information: [place] [address] [phone]                 Discharge Exam: Danley Danker Weights   05/22/22 1730 05/24/22 0525  Weight: 83.2 kg 81.7 kg   Physical Exam HENT:     Head: Normocephalic.     Mouth/Throat:     Pharynx: No oropharyngeal exudate.  Eyes:     General: Lids are normal.     Conjunctiva/sclera: Conjunctivae normal.  Cardiovascular:     Rate and Rhythm: Normal rate and regular rhythm.  Heart sounds: Normal heart sounds, S1 normal and S2 normal.  Pulmonary:     Breath sounds: Examination of the right-lower field reveals decreased breath sounds. Examination of the left-lower field reveals decreased breath sounds. Decreased breath sounds present. No wheezing, rhonchi or rales.  Abdominal:     Palpations: Abdomen is soft.     Tenderness: There is no abdominal tenderness.  Musculoskeletal:     Right lower leg: No swelling.     Left lower leg: No swelling.  Skin:    General: Skin is warm.     Findings: No rash.  Neurological:     Mental Status: He is alert.      Condition at discharge: stable  The results of significant diagnostics from this hospitalization (including imaging, microbiology, ancillary and laboratory) are listed below for reference.   Imaging Studies: CT Head Wo Contrast  Result Date: 05/22/2022 CLINICAL DATA:  Fall to the floor this morning, possibly related to a pacemaker discharge. Headache. History of stroke. EXAM: CT HEAD WITHOUT CONTRAST CT CERVICAL SPINE WITHOUT CONTRAST TECHNIQUE: Multidetector CT imaging of the head and cervical spine was performed following the standard protocol without intravenous contrast. Multiplanar CT image reconstructions of the cervical spine were also generated. RADIATION DOSE REDUCTION: This exam was performed according to the departmental dose-optimization program which includes automated exposure control, adjustment of the mA and/or kV according to patient size and/or use of iterative reconstruction technique. COMPARISON:  04/22/2022 FINDINGS: CT HEAD FINDINGS Brain:  Stable appearance of large region left temporal, occipital, and parietal lobe encephalomalacia from prior stroke. Periventricular white matter and corona radiata hypodensities favor chronic ischemic microvascular white matter disease. Otherwise, the brainstem, cerebellum, cerebral peduncles, thalamus, basal ganglia, basilar cisterns, and ventricular system appear within normal limits. No intracranial hemorrhage, mass lesion, or acute CVA. Vascular: Atheromatous calcification of the bilateral vertebral arteries and of the cavernous carotid arteries. Skull: Unremarkable Sinuses/Orbits: Complete opacification of the visualized portion of the left maxillary sinus. Mucous retention cyst in the right maxillary sinus. Left mastoid effusion along the petrous apex, suspicious for left petrous apicitis. Other: No supplemental non-categorized findings. CT CERVICAL SPINE FINDINGS Alignment: No subluxation. There is mild levoconvex cervical scoliosis mild ting to about 15 degrees between C2 and T1. Skull base and vertebrae: No fracture or acute bony findings. Soft tissues and spinal canal: Bilateral common carotid atherosclerotic vascular calcification. Disc levels: Uncinate and facet spurring cause osseous foraminal stenosis on the left at C6-7 and C7-T1, and on the right at C6-7. Upper chest: Unremarkable Other: No supplemental non-categorized findings. IMPRESSION: 1. No acute intracranial findings or acute cervical spine findings. 2. Left mastoid effusion along the petrous apex, suspicious for left petrous apicitis. Complete opacification of the visualized portion of the left maxillary sinus, cannot exclude acute left maxillary sinusitis. Chronic right maxillary sinusitis. 3. Stable appearance of large region of left temporal, occipital, and parietal lobe encephalomalacia from prior stroke. 4. Periventricular white matter and corona radiata hypodensities favor chronic ischemic microvascular white matter disease. 5. Cervical  spondylosis and degenerative disc disease causing osseous foraminal stenosis at C6-7 and C7-T1. 6. Mild levoconvex cervical scoliosis. 7. Atherosclerosis. Electronically Signed   By: Van Clines M.D.   On: 05/22/2022 18:15   CT Cervical Spine Wo Contrast  Result Date: 05/22/2022 CLINICAL DATA:  Fall to the floor this morning, possibly related to a pacemaker discharge. Headache. History of stroke. EXAM: CT HEAD WITHOUT CONTRAST CT CERVICAL SPINE WITHOUT CONTRAST TECHNIQUE: Multidetector CT imaging of the head and  cervical spine was performed following the standard protocol without intravenous contrast. Multiplanar CT image reconstructions of the cervical spine were also generated. RADIATION DOSE REDUCTION: This exam was performed according to the departmental dose-optimization program which includes automated exposure control, adjustment of the mA and/or kV according to patient size and/or use of iterative reconstruction technique. COMPARISON:  04/22/2022 FINDINGS: CT HEAD FINDINGS Brain: Stable appearance of large region left temporal, occipital, and parietal lobe encephalomalacia from prior stroke. Periventricular white matter and corona radiata hypodensities favor chronic ischemic microvascular white matter disease. Otherwise, the brainstem, cerebellum, cerebral peduncles, thalamus, basal ganglia, basilar cisterns, and ventricular system appear within normal limits. No intracranial hemorrhage, mass lesion, or acute CVA. Vascular: Atheromatous calcification of the bilateral vertebral arteries and of the cavernous carotid arteries. Skull: Unremarkable Sinuses/Orbits: Complete opacification of the visualized portion of the left maxillary sinus. Mucous retention cyst in the right maxillary sinus. Left mastoid effusion along the petrous apex, suspicious for left petrous apicitis. Other: No supplemental non-categorized findings. CT CERVICAL SPINE FINDINGS Alignment: No subluxation. There is mild levoconvex  cervical scoliosis mild ting to about 15 degrees between C2 and T1. Skull base and vertebrae: No fracture or acute bony findings. Soft tissues and spinal canal: Bilateral common carotid atherosclerotic vascular calcification. Disc levels: Uncinate and facet spurring cause osseous foraminal stenosis on the left at C6-7 and C7-T1, and on the right at C6-7. Upper chest: Unremarkable Other: No supplemental non-categorized findings. IMPRESSION: 1. No acute intracranial findings or acute cervical spine findings. 2. Left mastoid effusion along the petrous apex, suspicious for left petrous apicitis. Complete opacification of the visualized portion of the left maxillary sinus, cannot exclude acute left maxillary sinusitis. Chronic right maxillary sinusitis. 3. Stable appearance of large region of left temporal, occipital, and parietal lobe encephalomalacia from prior stroke. 4. Periventricular white matter and corona radiata hypodensities favor chronic ischemic microvascular white matter disease. 5. Cervical spondylosis and degenerative disc disease causing osseous foraminal stenosis at C6-7 and C7-T1. 6. Mild levoconvex cervical scoliosis. 7. Atherosclerosis. Electronically Signed   By: Gaylyn Rong M.D.   On: 05/22/2022 18:15   DG Chest 2 View  Result Date: 05/22/2022 CLINICAL DATA:  Chest pain EXAM: CHEST - 2 VIEW COMPARISON:  04/22/2022 FINDINGS: Post sternotomy changes and valve prosthesis. Left-sided multi lead pacing device as before. Cardiomegaly with aortic atherosclerosis. No acute airspace disease, pleural effusion, or pneumothorax. Fixating hardware in the left clavicle. IMPRESSION: No active cardiopulmonary disease. Cardiomegaly. Electronically Signed   By: Jasmine Pang M.D.   On: 05/22/2022 17:57   CUP PACEART REMOTE DEVICE CHECK  Result Date: 05/03/2022 Scheduled remote reviewed. Normal device function.  Optivol elevated Next remote 91 days.  CARDIAC CATHETERIZATION  Result Date:  04/25/2022 Conclusions: Moderate-severe multivessel coronary artery disease, including 50% proximal LAD stenosis (mid/distal LAD are small with prominent first septal branch consistent with "double-barrel LAD), occlusion of large proximal D1 (distal vessel is supplied by patent LIMA graft), moderate diffuse LCx disease, occluded OM2 branch supplied by ectatic but patent SVG, and sequential proximal through distal RCA disease of up to 80-90% that is not hemodynamically significant (iFR = 0.99). Widely patent LIMA-D1. Ectatic but patent SVG-OM2. Patent overlapping proximal through distal RCA stents with mild in-stent restenosis in the proximal segment. Moderately elevated left heart filling pressures (LVEDP 25-30 mmHg, PWCP 30 mmHg with prominent V-waves). Moderately-severely elevated right heart and pulmonary artery pressures (mean RA 15 mmHg, mean PAP 42 mmHg). Low normal to mildly reduced Fick cardiac output/index (Fick CO/CI,  4.9 L/min, 2.4 L/min/m^2). Recommendations: Continue medical therapy; no target for PCI.  I suspect elevated troponin represents supply-demand mismatch in the setting of chronic ischemic heart disease, acute on chronic HFrEF, and recent VT/VF with ICD shock. Aggressive diuresis as blood pressure and renal function allow. Escalate goal-directed medical therapy for HFrEF due to ischemic cardiomyopathy as blood pressure and renal function allow. Aggressive secondary prevention of coronary artery disease. Restart IV heparin 2 hours after TR band removal; transition back to apixaban as soon as tomorrow if there is no evidence of bleeding or vascular injury from catheterization. Findings and recommendations were discussed by telephone with the patient's legal guardian. Nelva Bush, MD [facility]   Microbiology: Results for orders placed or performed during the hospital encounter of 03/22/22  Culture, blood (routine x 2)     Status: None   Collection Time: 03/22/22 10:38 AM    Specimen: BLOOD  Result Value Ref Range Status   Specimen Description BLOOD LEFT ANTECUBITAL  Final   Special Requests   Final    BOTTLES DRAWN AEROBIC AND ANAEROBIC Blood Culture results may not be optimal due to an excessive volume of blood received in culture bottles   Culture   Final    NO GROWTH 8 DAYS Performed at [facility], [address] [place], [place] [address]    Report Status 03/30/2022 FINAL  Final  SARS Coronavirus 2 by RT PCR (hospital order, performed in [facility] hospital lab) *cepheid single result test* Anterior Nasal Swab     Status: Abnormal   Collection Time: 03/22/22  4:55 PM   Specimen: Anterior Nasal Swab  Result Value Ref Range Status   SARS Coronavirus 2 by RT PCR POSITIVE (A) NEGATIVE Final    Comment: (NOTE) SARS-CoV-2 target nucleic acids are DETECTED  SARS-CoV-2 RNA is generally detectable in upper respiratory specimens  during the acute phase of infection.  Positive results are indicative  of the presence of the identified virus, but do not rule out bacterial infection or co-infection with other pathogens not detected by the test.  Clinical correlation with patient history and  other diagnostic information is necessary to determine patient infection status.  The expected result is negative.  Fact Sheet for Patients:   https://www.patel.info/   Fact Sheet for Healthcare Providers:   https://hall.com/    This test is not yet approved or cleared by the [place] FDA and  has been authorized for detection and/or diagnosis of SARS-CoV-2 by FDA under an Emergency Use Authorization (EUA).  This EUA will remain in effect (meaning this test can be used) for the duration of  the COVID-19 declaration under Section 564(b)(1)  of the Act, 21 U.S.C. section 360-bbb-3(b)(1), unless the authorization is terminated or revoked sooner.   Performed at [facility], [address]  [place], [place] [address]   Culture, blood (routine x 2)     Status: None   Collection Time: 03/23/22  4:58 AM   Specimen: BLOOD  Result Value Ref Range Status   Specimen Description BLOOD BLOOD RIGHT HAND  Final   Special Requests   Final    BOTTLES DRAWN AEROBIC AND ANAEROBIC Blood Culture adequate volume   Culture   Final    NO GROWTH 5 DAYS Performed at [facility], [address] [place], [place] [address]    Report Status 03/28/2022 FINAL  Final    Labs: CBC: Recent Labs  Lab 05/22/22 1720 05/23/22 0120 05/24/22 0650 05/25/22 [id number]  WBC 8.2 7.1 8.5 7.3  HGB 12.6* 12.2* 12.5* 12.8*  HCT 38.6* 38.0* 38.4* 38.9*  MCV 87.5 87.4 87.1 86.8  PLT 229 205 217 [id number]   Basic Metabolic Panel: Recent Labs  Lab 05/22/22 1720 05/23/22 0120 05/24/22 0650 05/25/22 0514  NA 136 138 137 137  K 2.7* 3.3* 4.0 4.3  CL 100 103 99 100  CO2 26 27 28 27   GLUCOSE 117* 113* 110* 141*  BUN 12 13 21  28*  CREATININE 1.37* 1.23 1.12 1.23  CALCIUM 8.7* 9.0 9.5 10.2  MG 1.9 2.3 2.1 2.2  PHOS  --   --  3.8 5.0*   Liver Function Tests: Recent Labs  Lab 05/23/22 0120  AST 17  ALT 9  ALKPHOS 44  BILITOT 0.6  PROT 6.4*  ALBUMIN 2.9*   CBG: Recent Labs  Lab 05/24/22 1255 05/24/22 1618 05/24/22 2027 05/25/22 0829 05/25/22 1127  GLUCAP 142* 109* 140* 123* 149*    Discharge time spent: greater than 30 minutes.  Signed: Loletha Grayer, MD [facility] 05/25/2022

## 2022-05-25 NOTE — Progress Notes (Cosign Needed)
    Durable Medical Equipment  (From admission, onward)           Start     Ordered   05/25/22 0959  For home use only DME wheelchair cushion (seat and back)  Once        05/25/22 0958           Darolyn Rua, LCSW, MSW, [place] [phone]

## 2022-05-25 NOTE — Assessment & Plan Note (Signed)
Continue increased dose of amiodarone for another 4 days upon discharge and then back to usual dose of 400 mg daily.

## 2022-05-25 NOTE — Progress Notes (Addendum)
Cardiology Progress Note   Patient Name: Carl [name] Date of Encounter: 05/25/2022  Primary Cardiologist: Lorine Bears, MD  Subjective   Feels well this morning.  Able to lie flat.  Denies dyspnea.  Inpatient Medications    Scheduled Meds:  amiodarone  400 mg Oral BID   apixaban  5 mg Oral BID   dapagliflozin propanediol  10 mg Oral Daily   divalproex  500 mg Oral BID   escitalopram  10 mg Oral QHS   ezetimibe  10 mg Oral Daily   furosemide  60 mg Intravenous BID   hydrOXYzine  10 mg Oral BID   insulin aspart  0-15 Units Subcutaneous TID WC   insulin aspart  0-5 Units Subcutaneous QHS   isosorbide mononitrate  15 mg Oral Daily   levETIRAcetam  250 mg Oral BID   levothyroxine  25 mcg Oral Q0600   losartan  12.5 mg Oral Daily   metoprolol succinate  12.5 mg Oral QHS   multivitamin with minerals  1 tablet Oral Daily   pantoprazole  40 mg Oral Daily   potassium chloride  40 mEq Oral BID   QUEtiapine  200 mg Oral QHS   rosuvastatin  10 mg Oral Daily   sodium chloride flush  3 mL Intravenous Q12H   spironolactone  25 mg Oral Daily   tiotropium  18 mcg Inhalation Daily   Continuous Infusions:  PRN Meds: acetaminophen **OR** acetaminophen, albuterol, bisacodyl, hydrALAZINE, HYDROcodone-acetaminophen, morphine injection, nitroGLYCERIN, ondansetron **OR** ondansetron (ZOFRAN) IV, senna-docusate, zolpidem   Vital Signs    Vitals:   05/24/22 1615 05/24/22 2014 05/25/22 0438 05/25/22 0831  BP: 116/65 110/65 105/63 105/60  Pulse: 73 73 72 66  Resp: 16   20  Temp: 98.8 F (37.1 C) 98.6 F (37 C) 98.6 F (37 C) (!) 97.5 F (36.4 C)  TempSrc:  Oral Oral Oral  SpO2: 95%  96% 99%  Weight:      Height:        Intake/Output Summary (Last 24 hours) at 05/25/2022 0933 Last data filed at 05/25/2022 0436 Gross per 24 hour  Intake --  Output 1900 ml  Net -1900 ml   Filed Weights   05/22/22 1730 05/24/22 0525  Weight: 83.2 kg 81.7 kg    Physical Exam   GEN: Well  nourished, well developed, in no acute distress.  HEENT: Grossly normal.  Neck: Supple, mildly elevated JVD, no carotid bruits or masses. Cardiac: RRR, no rubs or gallops.  2/6 systolic murmur loudest at the upper sternal borders but heard throughout.  No clubbing, cyanosis, edema.  Radials 2+, DP/PT 2+ and equal bilaterally.  Respiratory:  Respirations regular and unlabored, clear to auscultation bilaterally. GI: Obese, protuberant soft, nontender, nondistended, BS + x 4. MS: no deformity or atrophy. Skin: warm and dry, no rash. Neuro:  Strength and sensation are intact. Psych: AAOx3.  Normal affect.  Labs    Chemistry Recent Labs  Lab 05/23/22 0120 05/24/22 0650 05/25/22 0514  NA 138 137 137  K 3.3* 4.0 4.3  CL 103 99 100  CO2 27 28 27   GLUCOSE 113* 110* 141*  BUN 13 21 28*  CREATININE 1.23 1.12 1.23  CALCIUM 9.0 9.5 10.2  PROT 6.4*  --   --   ALBUMIN 2.9*  --   --   AST 17  --   --   ALT 9  --   --   ALKPHOS 44  --   --   BILITOT  0.6  --   --   GFRNONAA >60 >60 >60  ANIONGAP 8 10 10      Hematology Recent Labs  Lab 05/23/22 0120 05/24/22 0650 05/25/22 0514  WBC 7.1 8.5 7.3  RBC 4.35 4.41 4.48  HGB 12.2* 12.5* 12.8*  HCT 38.0* 38.4* 38.9*  MCV 87.4 87.1 86.8  MCH 28.0 28.3 28.6  MCHC 32.1 32.6 32.9  RDW 17.5* 17.5* 17.2*  PLT 205 217 235    Cardiac Enzymes  Recent Labs  Lab 05/22/22 1720 05/22/22 2329 05/23/22 0120 05/23/22 0529  TROPONINIHS 78* 76* 83* 68*      BNP    Component Value Date/Time   BNP 878.6 (H) 04/21/2022 2356   Lipids  Lab Results  Component Value Date   CHOL 132 03/28/2022   HDL 44 03/28/2022   LDLCALC 67 03/28/2022   TRIG 107 03/28/2022   CHOLHDL 3.0 03/28/2022    HbA1c  Lab Results  Component Value Date   HGBA1C 6.6 (H) 03/25/2022    Radiology    CT Head Wo Contrast  Result Date: 05/22/2022 CLINICAL DATA:  Fall to the floor this morning, possibly related to a pacemaker discharge. Headache. History of  stroke. EXAM: CT HEAD WITHOUT CONTRAST CT CERVICAL SPINE WITHOUT CONTRAST TECHNIQUE: Multidetector CT imaging of the head and cervical spine was performed following the standard protocol without intravenous contrast. Multiplanar CT image reconstructions of the cervical spine were also generated. RADIATION DOSE REDUCTION: This exam was performed according to the departmental dose-optimization program which includes automated exposure control, adjustment of the mA and/or kV according to patient size and/or use of iterative reconstruction technique. COMPARISON:  04/22/2022 FINDINGS: CT HEAD FINDINGS Brain: Stable appearance of large region left temporal, occipital, and parietal lobe encephalomalacia from prior stroke. Periventricular white matter and corona radiata hypodensities favor chronic ischemic microvascular white matter disease. Otherwise, the brainstem, cerebellum, cerebral peduncles, thalamus, basal ganglia, basilar cisterns, and ventricular system appear within normal limits. No intracranial hemorrhage, mass lesion, or acute CVA. Vascular: Atheromatous calcification of the bilateral vertebral arteries and of the cavernous carotid arteries. Skull: Unremarkable Sinuses/Orbits: Complete opacification of the visualized portion of the left maxillary sinus. Mucous retention cyst in the right maxillary sinus. Left mastoid effusion along the petrous apex, suspicious for left petrous apicitis. Other: No supplemental non-categorized findings. CT CERVICAL SPINE FINDINGS Alignment: No subluxation. There is mild levoconvex cervical scoliosis mild ting to about 15 degrees between C2 and T1. Skull base and vertebrae: No fracture or acute bony findings. Soft tissues and spinal canal: Bilateral common carotid atherosclerotic vascular calcification. Disc levels: Uncinate and facet spurring cause osseous foraminal stenosis on the left at C6-7 and C7-T1, and on the right at C6-7. Upper chest: Unremarkable Other: No supplemental  non-categorized findings. IMPRESSION: 1. No acute intracranial findings or acute cervical spine findings. 2. Left mastoid effusion along the petrous apex, suspicious for left petrous apicitis. Complete opacification of the visualized portion of the left maxillary sinus, cannot exclude acute left maxillary sinusitis. Chronic right maxillary sinusitis. 3. Stable appearance of large region of left temporal, occipital, and parietal lobe encephalomalacia from prior stroke. 4. Periventricular white matter and corona radiata hypodensities favor chronic ischemic microvascular white matter disease. 5. Cervical spondylosis and degenerative disc disease causing osseous foraminal stenosis at C6-7 and C7-T1. 6. Mild levoconvex cervical scoliosis. 7. Atherosclerosis. Electronically Signed   By: 04/24/2022 M.D.   On: 05/22/2022 18:15   CT Cervical Spine Wo Contrast  Result Date: 05/22/2022 CLINICAL DATA:  Fall to the floor this morning, possibly related to a pacemaker discharge. Headache. History of stroke. EXAM: CT HEAD WITHOUT CONTRAST CT CERVICAL SPINE WITHOUT CONTRAST TECHNIQUE: Multidetector CT imaging of the head and cervical spine was performed following the standard protocol without intravenous contrast. Multiplanar CT image reconstructions of the cervical spine were also generated. RADIATION DOSE REDUCTION: This exam was performed according to the departmental dose-optimization program which includes automated exposure control, adjustment of the mA and/or kV according to patient size and/or use of iterative reconstruction technique. COMPARISON:  04/22/2022 FINDINGS: CT HEAD FINDINGS Brain: Stable appearance of large region left temporal, occipital, and parietal lobe encephalomalacia from prior stroke. Periventricular white matter and corona radiata hypodensities favor chronic ischemic microvascular white matter disease. Otherwise, the brainstem, cerebellum, cerebral peduncles, thalamus, basal ganglia, basilar  cisterns, and ventricular system appear within normal limits. No intracranial hemorrhage, mass lesion, or acute CVA. Vascular: Atheromatous calcification of the bilateral vertebral arteries and of the cavernous carotid arteries. Skull: Unremarkable Sinuses/Orbits: Complete opacification of the visualized portion of the left maxillary sinus. Mucous retention cyst in the right maxillary sinus. Left mastoid effusion along the petrous apex, suspicious for left petrous apicitis. Other: No supplemental non-categorized findings. CT CERVICAL SPINE FINDINGS Alignment: No subluxation. There is mild levoconvex cervical scoliosis mild ting to about 15 degrees between C2 and T1. Skull base and vertebrae: No fracture or acute bony findings. Soft tissues and spinal canal: Bilateral common carotid atherosclerotic vascular calcification. Disc levels: Uncinate and facet spurring cause osseous foraminal stenosis on the left at C6-7 and C7-T1, and on the right at C6-7. Upper chest: Unremarkable Other: No supplemental non-categorized findings. IMPRESSION: 1. No acute intracranial findings or acute cervical spine findings. 2. Left mastoid effusion along the petrous apex, suspicious for left petrous apicitis. Complete opacification of the visualized portion of the left maxillary sinus, cannot exclude acute left maxillary sinusitis. Chronic right maxillary sinusitis. 3. Stable appearance of large region of left temporal, occipital, and parietal lobe encephalomalacia from prior stroke. 4. Periventricular white matter and corona radiata hypodensities favor chronic ischemic microvascular white matter disease. 5. Cervical spondylosis and degenerative disc disease causing osseous foraminal stenosis at C6-7 and C7-T1. 6. Mild levoconvex cervical scoliosis. 7. Atherosclerosis. Electronically Signed   By: Gaylyn RongWalter  Liebkemann M.D.   On: 05/22/2022 18:15   DG Chest 2 View  Result Date: 05/22/2022 CLINICAL DATA:  Chest pain EXAM: CHEST - 2 VIEW  COMPARISON:  04/22/2022 FINDINGS: Post sternotomy changes and valve prosthesis. Left-sided multi lead pacing device as before. Cardiomegaly with aortic atherosclerosis. No acute airspace disease, pleural effusion, or pneumothorax. Fixating hardware in the left clavicle. IMPRESSION: No active cardiopulmonary disease. Cardiomegaly. Electronically Signed   By: Jasmine PangKim  Fujinaga M.D.   On: 05/22/2022 17:57    Telemetry    Predominantly V paced with occasional AV pacing.  5 beats of nonsustained VT- Personally Reviewed  Cardiac Studies   02/2022 Echo: EF 25-30%, mild conc LVH, nl RV fxn, sev dil LA, mildly dil RA, mild MR/AI.   04/2022 Cath: LM mild dzs, LAD 6951m, D1 100, LCX mod diff dzs, OM2 100, OM3 50, RCA 40/20p ISR, 85d (iFR 0.99), 50d, RPAV 60, LIMA->D1 small, patent, VG->OM2 large, mild dzs-->Med Rx.   Patient Profile      61 y.o. male with a history of CAD s/p CABG, ICM, HFrEF, VT/VF s/p CRT-D, HTN, HL, PAF, tob abuse, COPD, CKD II-III, stroke, and bipolar d/o, who was admitted November 15 with appropriate ICD shock/syncope/VT-VF, and acute on  chronic heart failure.  Assessment & Plan    1.  Syncope with VT/VF arrest: Admitted October with heart failure and finding of VT/VF requiring ICD shock.  Placed on amiodarone at that time and underwent diagnostic catheterization revealing 2 of 2 patent grafts, patent RCA stents with 85% distal RCA bifurcational disease and normal iFR with recommendation for medical therapy.  Presented back to the ED in November 15 following a syncopal episode with VT/VF noted on ICD interrogation.  We have been reloading oral Amio, and he is now on day 3 of 400 mg BID.  5 beats of nonsustained VT over the past 24 hours but no sustained VT.  We will plan to continue this dose for a total of 7 days and then plan to reduce to 200 mg BID.  Suspect scar-mediated VT versus end-stage cardiomyopathy.  Will need close outpatient monitoring of potassium and magnesium.  Outpt EP  f/u.  2.  Chronic HFrEF/ICM:  EF 25 to 30% by echo in August 2023.  He has had elevated OptiVol reading since October 5 and recent diagnostic catheterization October showed markedly elevated filling pressures.  He has received IV lasix since admission and Lasix escalated 60 mg IV twice daily on November 17 after being seen by advanced CHF team.  Good response to diuresis overnight  -1.9 L yesterday and -3.6 L since admission.  Weight pending this morning.  Was trending down yesterday but not quite as low as at previous discharge in October.  BUN and creatinine up slightly this morning after mildly elevated ReDS Vest reading of 34% yesterday.  Lying flat and appears relatively euvolemic on examination.  Was previously on Lasix 60 mg daily at home.  We will transition him to 60 mg oral twice daily.  Continue low-dose beta-blocker, SGLT2 inhibitor, ARB and spironolactone.  He will need close outpatient follow-up with our advanced heart failure team-scheduled for November 28.  3.  Essential hypertension: Stable with prior history of hypotension.  Tolerating addition of spironolactone this admission.  4.  Coronary artery disease/demand ischemia: Status post catheterization in October 2023, revealing 2 of 2 patent grafts with moderate RCA in-stent restenosis and distal bifurcation disease with a normal IFR.  No chest pain.  He remains beta-blocker, statin, and statin therapy.  Long-acting nitrate therapy added this admission recommend continuation.  5.  Hyperlipidemia: Continue statin and Zetia therapy.  LDL 67 in September.  6.  Paroxysmal atrial fibrillation: Maintaining sinus rhythm.  Anticoagulated with Eliquis.  Now on amiodarone in the setting of VT/VF.  7.  Stage III chronic kidney disease: Creatinine just slightly with diuresis this morning.  Transition back to p.o.  Will need outpatient follow-up.  8.  Ongoing tobacco abuse/COPD: Complete cessation advised.  No active wheezing.  9.  Bipolar  disorder: Per medicine team.  10.  Hypokalemia: Resolved.  Potassium 4.3 this morning.  Continue spironolactone and potassium supplementation.  11.  Type 2 diabetes mellitus: A1c 6.6.  Management per primary team.  On Farxiga.  12.  Hypothyroidism: On levothyroxine.  TSH was higher this admission at 11.393. Will have to watch closely in outpt setting on amio.  Signed, Nicolasa Ducking, NP  05/25/2022, 9:33 AM    For questions or updates, please contact   Please consult [email or web address] for contact info under Cardiology/STEMI.

## 2022-05-25 NOTE — Assessment & Plan Note (Signed)
Cardiology increase amiodarone to 400 mg twice daily for total of 1 week (4 more days upon discharge) and then can go back to 400 mg daily after that.

## 2022-05-25 NOTE — Assessment & Plan Note (Signed)
BMI 26.6

## 2022-05-25 NOTE — Assessment & Plan Note (Signed)
Replaced during the hospital course.  Recommend checking BMP in 5 days.  May be able to get rid of potassium as outpatient with the starting of spironolactone.

## 2022-05-25 NOTE — Assessment & Plan Note (Signed)
Continue metoprolol and increased dose of amiodarone and Eliquis for anticoagulation.

## 2022-05-25 NOTE — Assessment & Plan Note (Addendum)
Patient was diuresed with IV Lasix and then increased over to 60 mg twice a day of oral Lasix.  Patient on low-dose Toprol, low-dose losartan, spironolactone added.  Continue Farxiga.  Recommend checking a BMP with follow-up appointment because with the addition of Aldactone should be able to get rid of the potassium.  Ejection fraction on 02/16/2022 25%

## 2022-06-04 ENCOUNTER — Other Ambulatory Visit (HOSPITAL_COMMUNITY): Payer: Self-pay

## 2022-06-04 ENCOUNTER — Encounter: Payer: Self-pay | Admitting: Internal Medicine

## 2022-06-04 ENCOUNTER — Other Ambulatory Visit
Admission: RE | Admit: 2022-06-04 | Discharge: 2022-06-04 | Disposition: A | Discharge status: Home / Self Care | Payer: Medicaid Other | Source: Ambulatory Visit | Attending: Internal Medicine | Admitting: Internal Medicine

## 2022-06-04 ENCOUNTER — Ambulatory Visit (HOSPITAL_BASED_OUTPATIENT_CLINIC_OR_DEPARTMENT_OTHER): Payer: Medicaid Other | Admitting: Internal Medicine

## 2022-06-04 VITALS — BP 108/64 | HR 79 | Resp 16 | Wt 182.2 lb

## 2022-06-04 DIAGNOSIS — I472 Ventricular tachycardia, unspecified: Secondary | ICD-10-CM | POA: Diagnosis not present

## 2022-06-04 DIAGNOSIS — I2510 Atherosclerotic heart disease of native coronary artery without angina pectoris: Secondary | ICD-10-CM | POA: Diagnosis not present

## 2022-06-04 DIAGNOSIS — Z720 Tobacco use: Secondary | ICD-10-CM | POA: Diagnosis not present

## 2022-06-04 DIAGNOSIS — I5022 Chronic systolic (congestive) heart failure: Secondary | ICD-10-CM | POA: Insufficient documentation

## 2022-06-04 LAB — BASIC METABOLIC PANEL
Anion gap: 14 (ref 5–15)
BUN: 30 mg/dL — ABNORMAL HIGH (ref 8–23)
CO2: 23 mmol/L (ref 22–32)
Calcium: 9.9 mg/dL (ref 8.9–10.3)
Chloride: 93 mmol/L — ABNORMAL LOW (ref 98–111)
Creatinine, Ser: 1.81 mg/dL — ABNORMAL HIGH (ref 0.61–1.24)
GFR, Estimated: 41 mL/min — ABNORMAL LOW (ref >60)
Glucose, Bld: 95 mg/dL (ref 70–99)
Potassium: 4.5 mmol/L (ref 3.5–5.1)
Sodium: 130 mmol/L — ABNORMAL LOW (ref 135–145)

## 2022-06-04 LAB — MAGNESIUM: Magnesium: 2.5 mg/dL — ABNORMAL HIGH (ref 1.7–2.4)

## 2022-06-04 LAB — BRAIN NATRIURETIC PEPTIDE: B Natriuretic Peptide: 153.9 pg/mL — ABNORMAL HIGH (ref 0.0–100.0)

## 2022-06-04 NOTE — Progress Notes (Addendum)
ADVANCED HF CLINIC NOTE  PCP: [facility] Cardiologist: Lorine BearsMuhammad Arida, MD   HPI:   Leanord AsalWiley Gayton is a 61 y.o. male with a history of CAD s/p CABG, ICM, HFrEF, VT/VF s/p CRT-D, HTN, HL, PAF, tob abuse, COPD, CKD II-III, stroke, and bipolar d/o.     Underwent RCA stenting followed by CABG x2 with a LIMA-D1, SVG-> OM-2. Underwent CRT-D in October 2021.     Echo 8/23 EF 25-30% with mild concentric LVH, normal RV function, severe LAE, and mild MR/AI.  He was hospitalized in August 2023 with ADHF.   He was readmitted in September due to COPD exacerbation with readmission a day later secondary to COVID-19 infection, non-STEMI (troponin 1500), paroxysmal atrial fibrillation, hypertension, acute kidney injury, and CHF requiring diuresis.  During that admission, he initially required holding of beta-blocker and ACE inhibitor therapy but we were able to resume low-dose beta-blocker and he also required digoxin for elevated A-fib rates.  Following adequate diuresis and recovered from COVID, he was discharged back to assisted living.    Seen 04/12/22 in Cardiology Clinic with NYHA III-IIB symptoms. Both beta-blocker and ACE inhibitor discontinued secondary to relative hypotension.  He was readmitted October 16 with ADHF.  In the ER, he was tachycardic with mild troponin elevation of 168.  He was treated with Solu-Medrol and DuoNebs.  Device interrogation was notable for VT that degenerated to VF resulting in ICD shock of which, the patient was unaware.  He was seen by EP and placed on amio with recommendation for catheterization.  Cath performed and revealed 2 of 2 patent grafts with moderate but patent RCA grafts and distal RCA bifurcation disease up to 85% with normal iFR.  Medical therapy was recommended and he was subsequently discharged.   Readmitted with VT in 05/22/22. Optivol up. Was loaded with amio and diuresed.    He returns to clinic for f/u. He is doing well. Basically  wheelchair bound. No CP or SOB. Denies ICD firing. Still smoking a few cigarettes per day. Compliant with meds   ICD interrogation in Clinic: No further VT. No AF. Optivol way down (flat). 100% biv pacing Personally reviewed    ROS: All systems negative except as listed in HPI, PMH and Problem List.  SH:  Social History   Socioeconomic History   Marital status: Widowed    Spouse name: Not on file   Number of children: Not on file   Years of education: Not on file   Highest education level: Not on file  Occupational History   Not on file  Tobacco Use   Smoking status: Every Day    Packs/day: 0.25    Years: 0.00    Total pack years: 0.00    Types: Cigarettes   Smokeless tobacco: Never  Vaping Use   Vaping Use: Never used  Substance and Sexual Activity   Alcohol use: Not Currently   Drug use: Never   Sexual activity: Not Currently    Birth control/protection: Abstinence  Other Topics Concern   Not on file  Social History Narrative   Not on file   Social Determinants of Health   Financial Resource Strain: Not on file  Food Insecurity: No Food Insecurity (05/23/2022)   Hunger Vital Sign    Worried About Running Out of Food in the Last Year: Never true    Ran Out of Food in the Last Year: Never true  Transportation Needs: No Transportation Needs (05/23/2022)   PRAPARE -  Administrator, Civil Service (Medical): No    Lack of Transportation (Non-Medical): No  Physical Activity: Not on file  Stress: Not on file  Social Connections: Not on file  Intimate Partner Violence: Not At Risk (05/23/2022)   Humiliation, Afraid, Rape, and Kick questionnaire    Fear of Current or Ex-Partner: No    Emotionally Abused: No    Physically Abused: No    Sexually Abused: No    FH:  Family History  Problem Relation Age of Onset   Cancer Mother        "femal cancer"   Prostate cancer Father     Past Medical History:  Diagnosis Date   Acute pulmonary edema (HCC)  2017   Acute respiratory failure with hypoxia (HCC) 2017   Bipolar 1 disorder (HCC)    CAD (coronary artery disease)    a. Prior RCA stenting; b. 2017 s/p CABG x 2 (LIMA->D1, VG->OM2); b. 04/2022 Demand Isch/Cath: LM mild dzs, LAD 4m, D1 100, LCX mod diff dzs, OM2 100, OM3 50, RCA 40/20p ISR, 85d (iFR 0.99), 50d, RPAV 60, LIMA->D1 small, patent, VG->OM2 large, mild dzs-->Med Rx.   Chronic HFrEF (heart failure with reduced ejection fraction) (HCC)    a. 02/2022 Echo: EF 25-30%, mild conc LVH, nl RV fxn, sev dil LA, mildly dil RA, mild MR/AI.   CKD (chronic kidney disease), stage III (HCC)    COPD (chronic obstructive pulmonary disease) (HCC)    Depression    Hypertension    Ischemic cardiomyopathy    a. 04/2020 s/p MDT MRI compatible CRT-D (Ser # [id number]); b. 02/2022 Echo: EF 25-30%.   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     albuterol (PROVENTIL) (2.5 MG/3ML) 0.083% nebulizer solution Take 3 mLs (2.5 mg total) by nebulization every 6 (six) hours as needed for wheezing or shortness of breath. 360 mL 0   albuterol (VENTOLIN HFA) 108 (90 Base) MCG/ACT inhaler Inhale 2 puffs into the lungs every 6 (six) hours as needed for wheezing. 18 g 0   amiodarone (PACERONE) 400 MG tablet One tab (400mg ) po twice a day for four more days then once a day after that 34 tablet 0   apixaban (ELIQUIS) 5 MG TABS tablet TAKE 1 TABLET BY MOUTH TWICE A DAY 60 tablet 6   dapagliflozin propanediol (FARXIGA) 10 MG TABS tablet Take 1 tablet (10 mg total) by mouth daily. 30 tablet 1   divalproex (DEPAKOTE ER) 500 MG 24 hr tablet Take 1 tablet (500 mg total) by mouth 2  (two) times daily. 60 tablet 1   escitalopram (LEXAPRO) 10 MG tablet Take 1 tablet (10 mg total) by mouth at bedtime. 30 tablet 1   ezetimibe (ZETIA) 10 MG tablet Take 1 tablet (10 mg total) by mouth daily. 30 tablet 1   furosemide (LASIX) 20 MG tablet Take 3 tablets (60 mg total) by mouth 2 (two) times daily. 180 tablet 0   HYDROcodone-acetaminophen (NORCO/VICODIN) 5-325 MG tablet Take 1 tablet by mouth every 6 (six) hours as needed for severe pain. 10 tablet 0   Hydrocortisone Acetate 1 %  CREA Apply TID     hydrOXYzine (ATARAX) 10 MG tablet Take 10 mg by mouth 2 (two) times daily.     isosorbide mononitrate (IMDUR) 30 MG 24 hr tablet Take 0.5 tablets (15 mg total) by mouth daily. 15 tablet 0   levETIRAcetam (KEPPRA) 250 MG tablet Take 1 tablet (250 mg total) by mouth 2 (two) times daily. 60 tablet 0   levothyroxine (SYNTHROID) 25 MCG tablet Take 1 tablet (25 mcg total) by mouth daily. 30 tablet 1   losartan (COZAAR) 25 MG tablet Take 0.5 tablets (12.5 mg total) by mouth daily. 30 tablet 1   metoprolol succinate (TOPROL-XL) 25 MG 24 hr tablet Take 0.5 tablets (12.5 mg total) by mouth at bedtime. 30 tablet 1   Multiple Vitamin (MULTIVITAMIN WITH MINERALS) TABS tablet Take 1 tablet by mouth daily. 30 tablet 0   nitroGLYCERIN (NITROSTAT) 0.4 MG SL tablet Place 1 tablet (0.4 mg total) under the tongue every 5 (five) minutes as needed for chest pain. 30 tablet 0   omeprazole (PRILOSEC) 20 MG capsule Take 20 mg by mouth daily.     potassium chloride SA (KLOR-CON M) 20 MEQ tablet Take 1 tablet (20 mEq total) by mouth daily. (Patient taking differently: Take 40 mEq by mouth daily.) 30 tablet 0   QUEtiapine (SEROQUEL) 200 MG tablet Take 200 mg by mouth at bedtime.     rosuvastatin (CRESTOR) 10 MG tablet Take 1 tablet (10 mg total) by mouth daily. 30 tablet 6   spironolactone (ALDACTONE) 25 MG tablet Take 1 tablet (25 mg total) by mouth daily. 30 tablet 0   tiotropium (SPIRIVA) 18 MCG inhalation capsule  Place 18 mcg into inhaler and inhale daily.     traMADol (ULTRAM) 50 MG tablet Take 50 mg by mouth every 12 (twelve) hours as needed for moderate pain.     No current facility-administered medications for this visit.    Vitals:   06/04/22 1037  BP: 108/64  Pulse: 79  Resp: 16  SpO2: 100%  Weight: 182 lb 4 oz (82.7 kg)   Wt Readings from Last 3 Encounters:  06/04/22 182 lb 4 oz (82.7 kg)  05/24/22 180 lb 1.6 oz (81.7 kg)  05/16/22 183 lb 8 oz (83.2 kg)    PHYSICAL EXAM:  General:  Well appearing. No resp difficulty HEENT: normal Neck: supple. no JVD. Carotids 2+ bilat; no bruits. No lymphadenopathy or thryomegaly appreciated. Cor: PMI nondisplaced. Regular rate & rhythm. No rubs, gallops or murmurs. Lungs: clear Abdomen: soft, nontender, nondistended. No hepatosplenomegaly. No bruits or masses. Good bowel sounds. Extremities: no cyanosis, clubbing, rash, edema Neuro: alert & orientedx3, cranial nerves grossly intact. moves all 4 extremities w/o difficulty. Affect pleasant    ASSESSMENT & PLAN:   1.  VT/VF arrest with ICD shock: - Had VT/VF  in 10/23, Cath at time with stable CAD - Recurrent VT in 11/23 K 2.7 at time - Continue K supplementation Keep K > 4.0 Mg > 2.0  - Continue amio now at 400 daily  - suspect scar-mediated VT vs end-stage CM - EP following   2.  Chronic heart failure with reduced ejection fraction/ischemic cardiomyopathy:  - EF 25 to 30% by echo in August 2023.   - s/p MDT CRT-D in setting of LBBB - Volume status much improved. Optivol back down. Continue lasix 60 bid - Stable NYHA III (wheelchair bound) - Continue Farxiga 10 - Continue spiro 25 - Continue losartan 12.5 - Continue Toprol 12.5 daily - Not  candidate for advanced therapies with tobacco use, immobility and poor insight so I am not sure that there is currently a role for RHC and not able to complete CPX testing   3.  Coronary artery disease/demand ischemia: Status post recent  catheterization October 2023 revealing 2 of 2 patent grafts with moderate RCA in-stent restenosis and distal bifurcation disease with normal iFR.  Patient denies experiencing chest pain recently.  - no s/s angina - continue medical management    4.  Paroxysmal atrial fibrillation:  - Maintaining sinus rhythm.  Anticoagulated with Eliquis.  Now on Amio in the setting of VT/VF   5.  Ongoing tobacco abuse/COPD:  - Cessation advised. He says he is trying to cut back    6.  Bipolar disorder: - stable  7.  Hypokalemia:  - check labs today  Arvilla Meres, MD  10:53 AM

## 2022-06-04 NOTE — TOC Benefit Eligibility Note (Signed)
Patient Product/process development scientist completed.    The patient is currently admitted and upon discharge could be taking Entresto 24-26 mg.  Requires Prior Authorization  The patient is insured through [facility] Medicaid     Roland Earl, CPhT Pharmacy Patient Advocate Specialist [facility] Patient Advocate Team Direct Number: [phone]  Fax: [phone]

## 2022-06-04 NOTE — Patient Instructions (Addendum)
Medication Changes:  No new medication changes today    Lab Work:  Labs done today, your results will be available in MyChart, we will contact you for abnormal readings.   Testing/Procedures:  None  Referrals:  No referrals placed today.   Special Instructions // Education:  Do the following things EVERYDAY: Weigh yourself in the morning before breakfast. Write it down and keep it in a log. Take your medicines as prescribed Eat low salt foods--Limit salt (sodium) to 2000 mg per day.  Stay as active as you can everyday Limit all fluids for the day to less than 2 liters   Follow-Up in:  Follow up in 2 months.  Appointment is scheduled for 08/05/21 at 10:20    If you have any questions or concerns before your next appointment please send [place] a message through [place] or call our office at [phone]

## 2022-06-14 ENCOUNTER — Encounter: Payer: Self-pay | Admitting: Medical

## 2022-06-14 ENCOUNTER — Ambulatory Visit: Payer: Medicaid Other | Attending: Medical | Admitting: Medical

## 2022-06-14 ENCOUNTER — Other Ambulatory Visit
Admission: RE | Admit: 2022-06-14 | Discharge: 2022-06-14 | Disposition: A | Discharge status: Home / Self Care | Payer: Medicaid Other | Source: Ambulatory Visit | Attending: Medical | Admitting: Medical

## 2022-06-14 ENCOUNTER — Encounter: Payer: Self-pay | Admitting: Internal Medicine

## 2022-06-14 VITALS — BP 100/60 | HR 70 | Wt 183.0 lb

## 2022-06-14 DIAGNOSIS — I5022 Chronic systolic (congestive) heart failure: Secondary | ICD-10-CM | POA: Diagnosis not present

## 2022-06-14 DIAGNOSIS — I2510 Atherosclerotic heart disease of native coronary artery without angina pectoris: Secondary | ICD-10-CM | POA: Insufficient documentation

## 2022-06-14 DIAGNOSIS — I255 Ischemic cardiomyopathy: Secondary | ICD-10-CM | POA: Insufficient documentation

## 2022-06-14 DIAGNOSIS — Z95810 Presence of automatic (implantable) cardiac defibrillator: Secondary | ICD-10-CM | POA: Insufficient documentation

## 2022-06-14 DIAGNOSIS — I472 Ventricular tachycardia, unspecified: Secondary | ICD-10-CM | POA: Insufficient documentation

## 2022-06-14 DIAGNOSIS — Z720 Tobacco use: Secondary | ICD-10-CM | POA: Diagnosis present

## 2022-06-14 DIAGNOSIS — Z79899 Other long term (current) drug therapy: Secondary | ICD-10-CM | POA: Insufficient documentation

## 2022-06-14 DIAGNOSIS — I480 Paroxysmal atrial fibrillation: Secondary | ICD-10-CM | POA: Insufficient documentation

## 2022-06-14 DIAGNOSIS — E876 Hypokalemia: Secondary | ICD-10-CM | POA: Insufficient documentation

## 2022-06-14 LAB — BASIC METABOLIC PANEL
Anion gap: 9 (ref 5–15)
BUN: 20 mg/dL (ref 8–23)
CO2: 29 mmol/L (ref 22–32)
Calcium: 9.3 mg/dL (ref 8.9–10.3)
Chloride: 97 mmol/L — ABNORMAL LOW (ref 98–111)
Creatinine, Ser: 1.65 mg/dL — ABNORMAL HIGH (ref 0.61–1.24)
GFR, Estimated: 46 mL/min — ABNORMAL LOW (ref >60)
Glucose, Bld: 92 mg/dL (ref 70–99)
Potassium: 4.2 mmol/L (ref 3.5–5.1)
Sodium: 135 mmol/L (ref 135–145)

## 2022-06-14 LAB — DIGOXIN LEVEL: Digoxin Level: 1.3 ng/mL (ref 0.8–2.0)

## 2022-06-14 LAB — MAGNESIUM: Magnesium: 2.1 mg/dL (ref 1.7–2.4)

## 2022-06-14 MED ORDER — POTASSIUM CHLORIDE CRYS ER 20 MEQ PO TBCR: 40.0000 meq | EXTENDED_RELEASE_TABLET | Freq: Every day | ORAL | 3 refills | Status: DC

## 2022-06-14 MED ORDER — AMIODARONE HCL 400 MG PO TABS: 400.0000 mg | ORAL_TABLET | Freq: Every day | ORAL | 3 refills | Status: DC

## 2022-06-14 NOTE — Progress Notes (Signed)
Cardiology Office Note:    Date:  06/14/2022   ID:  Leanord [name], DOB 11-12-1960, MRN [id number]  PCP:  [facility]  [facility] Cardiologist:  Lorine Bears, MD  [facility] Electrophysiologist:  [name], MD   Referring MD: Almetta Lovely, Doctors [facility]*   Chief Complaint: 1 month follow-up/medication questions  History of Present Illness:    Carl [name] is a 61 y.o. male with a hx of AD s/p CABG in 2017, ICM, EF30-35%, s/p ICD 2017, COPD, paroxysmal Afib on Eliquis, CVA, bipolar disorder, current smoker who presented for hospital follow-up.    The patient was hospitalized in May 2021 with respiratory distress in the setting of COPD and heart failure. Echo showed LVEF 25-30%. He underwent Myoview Lexiscan which showed evidence of prior infarct with no ischemia. He was hospitalized in July 2021 for sepsis secondary to PNA. He underwent BiV ICD upgrade by Dr. Graciela Husbands in October 2021. Echo in February 2022 showed an EF of 30-35% with mild to mod MR.    He was hospitalized in August 2023 with shortness of breath and hypoxiz, brought via EMS. BNP 888 and troponin was 68. He was treated with IV heparin and IV lasix. Echo showed LVEF 25-30%, elevated LVEDP, severely dilated LA, mild MR.    The patient was discharged from [facility]/22 for suicidal and homicidal ideation.   He was readmitted early September for COPD exacerbation. He was discharged 03/21/22.   The patient went back to the hospital 03/22/22 for shortness of breath and admitted with elevated troponin/NSTEMI, acute CHF, COPD exacerbation, COVID-19 virus, acute respiratory failure, hypotension, acute renal failure, paroxysmal Afib. He was treated with IV heparin for 48 hours. BB was restarted and digoxin was added for afib. Lisinopril was held for soft blood pressures. He was treated with IV lasix and diuresed 8.8 L. Midodrine and digoxin were stopped at discharge.  He was seen in  follow-up 04/12/2022 and was short of breath.  Pressure was low.  He was taking his Lasix daily.  Patient had crackles on exam and chest x-ray, BNP, BMP and CBC were ordered.  Lisinopril was stopped for low blood pressure.  The patient was admitted again in October 2023 for shortness of breath requiring supplemental oxygen and ICD firing.  Telemetry showed frequent short runs of V. tach.  Digoxin was discontinued he was started on amiodarone.  Patient was treated with IV Lasix, sent home on Lasix 60 mg daily.  Echo showed LVEF 25 to 30%.  Right and left heart cath showed moderate to severe multivessel CAD, moderate to severe pulmonary artery pressures.  He was continued on aspirin and statin.  Patient was readmitted in November 2023 for AICD discharge. He was loaded with amiodarone and diuresed.   He saw Advanced CHF clinic 06/04/22 and was doing well. He reported compliance with medications. Labs were drawn.  Today, medications were reviewed in detail given disepncey in the faciltiy list and current list. He denies SOB, chest pain, lower leg edema, orthopnea, or pnd. The following medications were updated on his facility list:  Potassium Amiodarone 400mg  dialy Klor-con Stop digoxen  Labs 11/28 showed BNP 153 and Scr1.8, BUN 30, sodium 130, Mag 2.5  Past Medical History:  Diagnosis Date   Acute pulmonary edema (HCC) 2017   Acute respiratory failure with hypoxia (HCC) 2017   Bipolar 1 disorder (HCC)    CAD (coronary artery disease)    a. Prior RCA stenting; b.  2017 s/p CABG x 2 (LIMA->D1, VG->OM2); b. 04/2022 Demand Isch/Cath: LM mild dzs, LAD 3245m, D1 100, LCX mod diff dzs, OM2 100, OM3 50, RCA 40/20p ISR, 85d (iFR 0.99), 50d, RPAV 60, LIMA->D1 small, patent, VG->OM2 large, mild dzs-->Med Rx.   Chronic HFrEF (heart failure with reduced ejection fraction) (HCC)    a. 02/2022 Echo: EF 25-30%, mild conc LVH, nl RV fxn, sev dil LA, mildly dil RA, mild MR/AI.   CKD (chronic kidney  disease), stage III (HCC)    COPD (chronic obstructive pulmonary disease) (HCC)    Depression    Hypertension    Ischemic cardiomyopathy    a. 04/2020 s/p MDT MRI compatible CRT-D (Ser # [id number]); b. 02/2022 Echo: EF 25-30%.   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   Seizures (HCC)    childhood   Stroke [facility](HCC)    Tobacco abuse     Past Surgical History:  Procedure Laterality Date   BIV UPGRADE N/A 05/05/2020   Procedure: BIV ICD UPGRADE;  Surgeon: Duke SalviaKlein, Steven C, MD;  [facility];  Service: Cardiovascular;  Laterality: N/A;   CORONARY ARTERY BYPASS GRAFT     INTRAVASCULAR PRESSURE WIRE/FFR STUDY N/A 04/25/2022   Procedure: INTRAVASCULAR PRESSURE WIRE/FFR STUDY;  Surgeon: Yvonne KendallEnd, Christopher, MD;  Location: [facility] INVASIVE CV LAB;  Service: Cardiovascular;  Laterality: N/A;   RIGHT/LEFT HEART CATH AND CORONARY/GRAFT ANGIOGRAPHY N/A 04/25/2022   Procedure: RIGHT/LEFT HEART CATH AND CORONARY/GRAFT ANGIOGRAPHY;  Surgeon: Yvonne KendallEnd, Christopher, MD;  Location: [facility] INVASIVE CV LAB;  Service: Cardiovascular;  Laterality: N/A;    Current Medications: Current Meds  Medication Sig   acetaminophen (TYLENOL) 500 MG tablet Take 500 mg by mouth every 6 (six) hours as needed for mild pain.   albuterol (PROVENTIL) (2.5 MG/3ML) 0.083% nebulizer solution Take 3 mLs (2.5 mg total) by nebulization every 6 (six) hours as needed for wheezing or shortness of breath.   albuterol (VENTOLIN HFA) 108 (90 Base) MCG/ACT inhaler Inhale 2 puffs into the lungs every 6 (six) hours as needed for wheezing.   apixaban (ELIQUIS) 5 MG TABS tablet TAKE 1 TABLET BY MOUTH TWICE A DAY   dapagliflozin propanediol (FARXIGA) 10 MG TABS tablet Take 1 tablet (10 mg total) by mouth daily.   divalproex (DEPAKOTE ER) 500 MG 24 hr tablet Take 1 tablet (500 mg total) by mouth 2 (two) times daily.   escitalopram  (LEXAPRO) 10 MG tablet Take 1 tablet (10 mg total) by mouth at bedtime.   ezetimibe (ZETIA) 10 MG tablet Take 1 tablet (10 mg total) by mouth daily.   furosemide (LASIX) 20 MG tablet Take 3 tablets (60 mg total) by mouth 2 (two) times daily.   HYDROcodone-acetaminophen (NORCO/VICODIN) 5-325 MG tablet Take 1 tablet by mouth every 6 (six) hours as needed for severe pain.   Hydrocortisone Acetate 1 % CREA Apply TID   hydrOXYzine (ATARAX) 10 MG tablet Take 10 mg by mouth 2 (two) times daily.   isosorbide mononitrate (IMDUR) 30 MG 24 hr tablet Take 0.5 tablets (15 mg total) by mouth daily.   levETIRAcetam (KEPPRA) 250 MG tablet Take 1 tablet (250 mg total) by mouth 2 (two) times daily.   levothyroxine (SYNTHROID) 25 MCG tablet Take 1 tablet (25 mcg total) by mouth daily.   losartan (COZAAR) 25 MG tablet Take 0.5 tablets (12.5 mg total) by mouth daily.   metoprolol succinate (TOPROL-XL) 25  MG 24 hr tablet Take 0.5 tablets (12.5 mg total) by mouth at bedtime.   Multiple Vitamin (MULTIVITAMIN WITH MINERALS) TABS tablet Take 1 tablet by mouth daily.   nitroGLYCERIN (NITROSTAT) 0.4 MG SL tablet Place 1 tablet (0.4 mg total) under the tongue every 5 (five) minutes as needed for chest pain.   omeprazole (PRILOSEC) 20 MG capsule Take 20 mg by mouth daily.   QUEtiapine (SEROQUEL) 200 MG tablet Take 200 mg by mouth at bedtime.   rosuvastatin (CRESTOR) 10 MG tablet Take 1 tablet (10 mg total) by mouth daily.   spironolactone (ALDACTONE) 25 MG tablet Take 1 tablet (25 mg total) by mouth daily.   tiotropium (SPIRIVA) 18 MCG inhalation capsule Place 18 mcg into inhaler and inhale daily.   traMADol (ULTRAM) 50 MG tablet Take 50 mg by mouth every 12 (twelve) hours as needed for moderate pain.   [DISCONTINUED] amiodarone (PACERONE) 400 MG tablet Take 400 mg by mouth daily.   [DISCONTINUED] digoxin (LANOXIN) 0.125 MG tablet Take 0.125 mg by mouth daily.   [DISCONTINUED] potassium chloride SA (KLOR-CON M) 20 MEQ tablet  Take 40 mEq by mouth daily.     Allergies:   Iodinated contrast media, Penicillins, Strawberry extract, Cefepime, Erythromycin, and Sulfa antibiotics   Social History   Socioeconomic History   Marital status: Widowed    Spouse name: Not on file   Number of children: Not on file   Years of education: Not on file   Highest education level: Not on file  Occupational History   Not on file  Tobacco Use   Smoking status: Every Day    Packs/day: 0.25    Years: 0.00    Total pack years: 0.00    Types: Cigarettes   Smokeless tobacco: Never  Vaping Use   Vaping Use: Never used  Substance and Sexual Activity   Alcohol use: Not Currently   Drug use: Never   Sexual activity: Not Currently    Birth control/protection: Abstinence  Other Topics Concern   Not on file  Social History Narrative   Not on file   Social Determinants of Health   Financial Resource Strain: Not on file  Food Insecurity: No Food Insecurity (05/23/2022)   Hunger Vital Sign    Worried About Running Out of Food in the Last Year: Never true    Ran Out of Food in the Last Year: Never true  Transportation Needs: No Transportation Needs (05/23/2022)   PRAPARE - Administrator, Civil Service (Medical): No    Lack of Transportation (Non-Medical): No  Physical Activity: Not on file  Stress: Not on file  Social Connections: Not on file     Family History: The patient's family history includes Cancer in his mother; Prostate cancer in his father.  ROS:   Please see the history of present illness.     All other systems reviewed and are negative.  EKGs/Labs/Other Studies Reviewed:    The following studies were reviewed today:  R/L cardiac cath 04/2022 Conclusions: Moderate-severe multivessel coronary artery disease, including 50% proximal LAD stenosis (mid/distal LAD are small with prominent first septal branch consistent with "double-barrel LAD), occlusion of large proximal D1 (distal vessel is  supplied by patent LIMA graft), moderate diffuse LCx disease, occluded OM2 branch supplied by ectatic but patent SVG, and sequential proximal through distal RCA disease of up to 80-90% that is not hemodynamically significant (iFR = 0.99). Widely patent LIMA-D1. Ectatic but patent SVG-OM2. Patent overlapping proximal through distal  RCA stents with mild in-stent restenosis in the proximal segment. Moderately elevated left heart filling pressures (LVEDP 25-30 mmHg, PWCP 30 mmHg with prominent V-waves). Moderately-severely elevated right heart and pulmonary artery pressures (mean RA 15 mmHg, mean PAP 42 mmHg). Low normal to mildly reduced Fick cardiac output/index (Fick CO/CI, 4.9 L/min, 2.4 L/min/m^2).   Recommendations: Continue medical therapy; no target for PCI.  I suspect elevated troponin represents supply-demand mismatch in the setting of chronic ischemic heart disease, acute on chronic HFrEF, and recent VT/VF with ICD shock. Aggressive diuresis as blood pressure and renal function allow. Escalate goal-directed medical therapy for HFrEF due to ischemic cardiomyopathy as blood pressure and renal function allow. Aggressive secondary prevention of coronary artery disease. Restart IV heparin 2 hours after TR band removal; transition back to apixaban as soon as tomorrow if there is no evidence of bleeding or vascular injury from catheterization.   Findings and recommendations were discussed by telephone with the patient's legal guardian.   Yvonne Kendall, MD [facility]  Echo 02/2022 1. Left ventricular ejection fraction, by estimation, is 25 to 30%. The  left ventricle has severely decreased function. The left ventricle  demonstrates regional wall motion abnormalities (see scoring  diagram/findings for description). The left  ventricular internal cavity size was moderately dilated. There is mild  concentric left ventricular hypertrophy. Left ventricular diastolic  parameters are  indeterminate. Elevated left ventricular end-diastolic  pressure.   2. Right ventricular systolic function is normal. The right ventricular  size is normal.   3. Left atrial size was severely dilated.   4. Right atrial size was mildly dilated.   5. The mitral valve is normal in structure. Mild mitral valve  regurgitation. No evidence of mitral stenosis.   6. The aortic valve is tricuspid. Aortic valve regurgitation is mild. No  aortic stenosis is present.   7. The inferior vena cava is normal in size with <50% respiratory  variability, suggesting right atrial pressure of 8 mmHg.   EKG:  EKG is ordered today.  The ekg ordered today demonstrates Asensed V paced 70bpm, LAD, LBBB  Recent Labs: 05/23/2022: ALT 9; TSH 11.393 05/25/2022: Hemoglobin 12.8; Platelets 235 06/04/2022: B Natriuretic Peptide 153.9; BUN 30; Creatinine, Ser 1.81; Magnesium 2.5; Potassium 4.5; Sodium 130  Recent Lipid Panel    Component Value Date/Time   CHOL 132 03/28/2022 0728   TRIG 107 03/28/2022 0728   HDL 44 03/28/2022 0728   CHOLHDL 3.0 03/28/2022 0728   VLDL 21 03/28/2022 0728   LDLCALC 67 03/28/2022 0728    Physical Exam:    VS:  BP 100/60 (BP Location: Left Arm, Patient Position: Sitting, Cuff Size: Normal)   Pulse 70   Wt 183 lb (83 kg)   SpO2 90%   BMI 27.02 kg/m     Wt Readings from Last 3 Encounters:  06/14/22 183 lb (83 kg)  06/04/22 182 lb 4 oz (82.7 kg)  05/24/22 180 lb 1.6 oz (81.7 kg)     GEN:  Well nourished, well developed in no acute distress HEENT: Normal NECK: No JVD; No carotid bruits LYMPHATICS: No lymphadenopathy CARDIAC: RRR, no murmurs, rubs, gallops RESPIRATORY:  Clear to auscultation without rales, wheezing or rhonchi  ABDOMEN: Soft, non-tender, non-distended MUSCULOSKELETAL:  No edema; No deformity  SKIN: Warm and dry NEUROLOGIC:  Alert and oriented x 3 PSYCHIATRIC:  Normal affect   ASSESSMENT:    1. VT (ventricular tachycardia) (HCC)   2. ICD (implantable  cardioverter-defibrillator), dual, in situ   3.  Chronic systolic heart failure (HCC)   4. Ischemic cardiomyopathy   5. CAD in native artery   6. PAF (paroxysmal atrial fibrillation) (HCC)   7. Tobacco use   8. Hypokalemia   9. Hypermagnesemia    PLAN:    In order of problems listed above:  VT/AF arrest with ICD shock - VT/Vtfib arrest 04/2022, cath at that time showed stable CAD - recurrent VT 05/2022 K 2.7 at that time - continue klor-con daily, BMET today - Mag was 2.5 on last check, check Mag today - continue amiodarone 400mg  daily - suspect scar-mediated VT vs end stage CM - no further ICD shocks reported - EP following  Chronic systolic heart failure Ischemic Cardiomyopathy - LVEF 25-30% by echo in August 2023 - s/p MDT CRT-D in setting of LBBB - appears euvolemic on exam - lasix 60mg  BID - labs 11/28 showed BNP 153 and AKI with Scr 1.81/BUN 30. Repeat BMET as above - continue Farxiga 10mg  daily - continue spironolactone 25mg  daily - continue losartan 12.5mg  daily - continue Toprol 12.5mg  daily - patient follows with Advanced heart failure team - not a candidate for advanced therapies given tobacco use, immobility and poor insight. Follow up with ACHF in 1-2 months.  CAD - Cath in 04/2022 showed 2/2 patent grafts with moderate RCA ISR and distal bifurcation disease with normal iFR - patient denies anginal symptoms - continue Zetia, Toprol, SL NTG, and Crestor  Paroxysmal Afib - A sensed V paced rhythm on EKG - continue amiodarone - continue Eliquis 5mg  BID  Tobacco use - cessation advised  Hypokalemia Hypermagnesemia AKI - BMET today  Disposition: Follow up in 4 month(s) with MD/APP    Signed, Maicey Barrientez 12/28, PA-C  06/14/2022 12:37 PM    [facility]

## 2022-06-14 NOTE — Patient Instructions (Signed)
Medication Instructions:  Stop digoxin Change potassium to daily Change amiodarone to 400mg  daily *If you need a refill on your cardiac medications before your next appointment, please call your pharmacy*   Lab Work: BMET and Mag If you have labs (blood work) drawn today and your tests are completely normal, you will receive your results only by: MyChart Message (if you have MyChart) OR A paper copy in the mail If you have any lab test that is abnormal or we need to change your treatment, we will call you to review the results.   Testing/Procedures: N/A   Follow-Up: At [facility], you and your health needs are our priority.  As part of our continuing mission to provide you with exceptional heart care, we have created designated Provider Care Teams.  These Care Teams include your primary Cardiologist (physician) and Advanced Practice Providers (APPs -  Physician Assistants and Nurse Practitioners) who all work together to provide you with the care you need, when you need it.  We recommend signing up for the patient portal called "MyChart".  Sign up information is provided on this After Visit Summary.  MyChart is used to connect with patients for Virtual Visits (Telemedicine).  Patients are able to view lab/test results, encounter notes, upcoming appointments, etc.  Non-urgent messages can be sent to your provider as well.   To learn more about what you can do with MyChart, go to [facility].    Your next appointment:   4 month(s)  The format for your next appointment:   In Person  Provider:   You may see [email or web address], MD or one of the following Advanced Practice Providers on your designated Care Team:   Lorine Bears, NP Nicolasa Ducking, PA-C Arvin Abello Eula Listen, PA-C Fransico Michael, NP    Other Instructions  2 month follow-up with Advanced CHF team  Important Information About Sugar

## 2022-07-01 IMAGING — CR DG LUMBAR SPINE COMPLETE 4+V
5 series · 5 of 5 positions shown · non-contrast
Comparison: None.

CLINICAL DATA: Fall, pelvic pain

EXAM:
LUMBAR SPINE - COMPLETE 4+ VIEW

[l-spine ap]
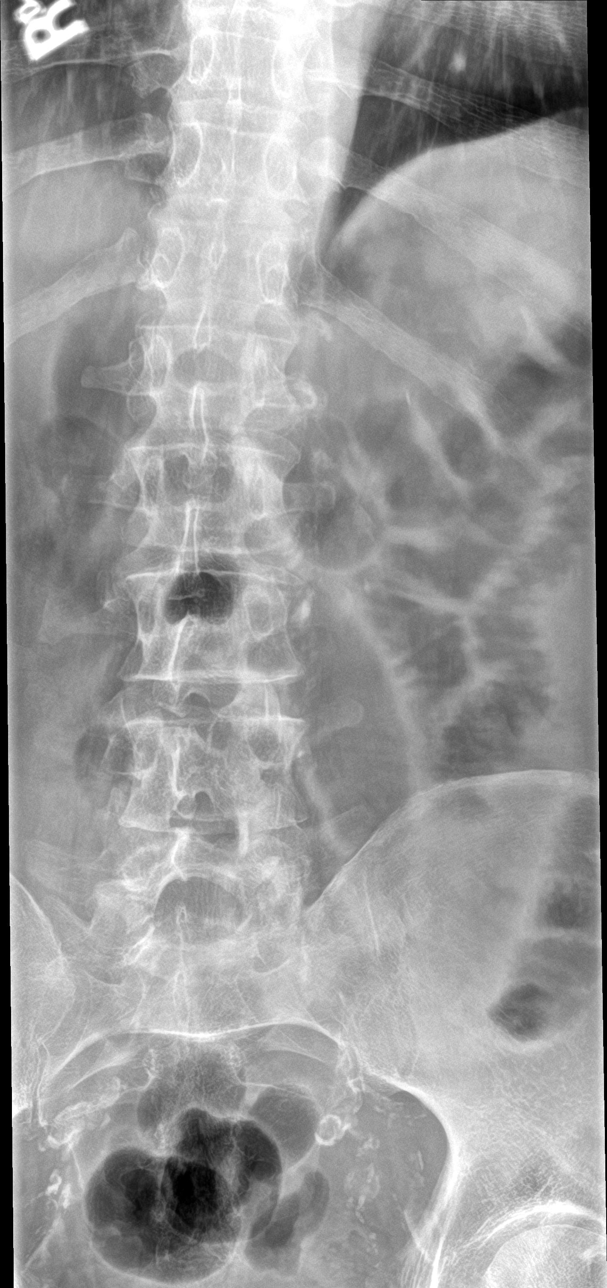

[l-spine obl (1 of 2)]
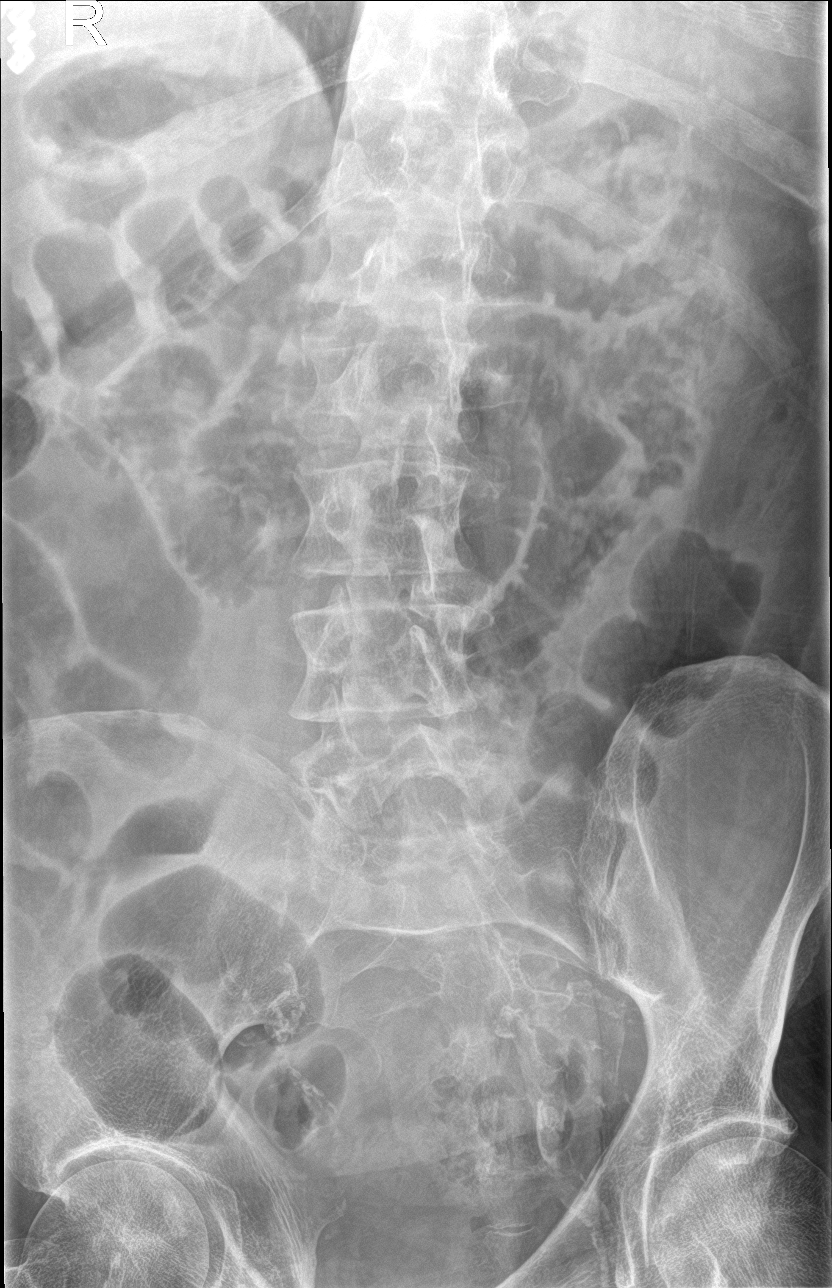

[l-spine obl (2 of 2)]
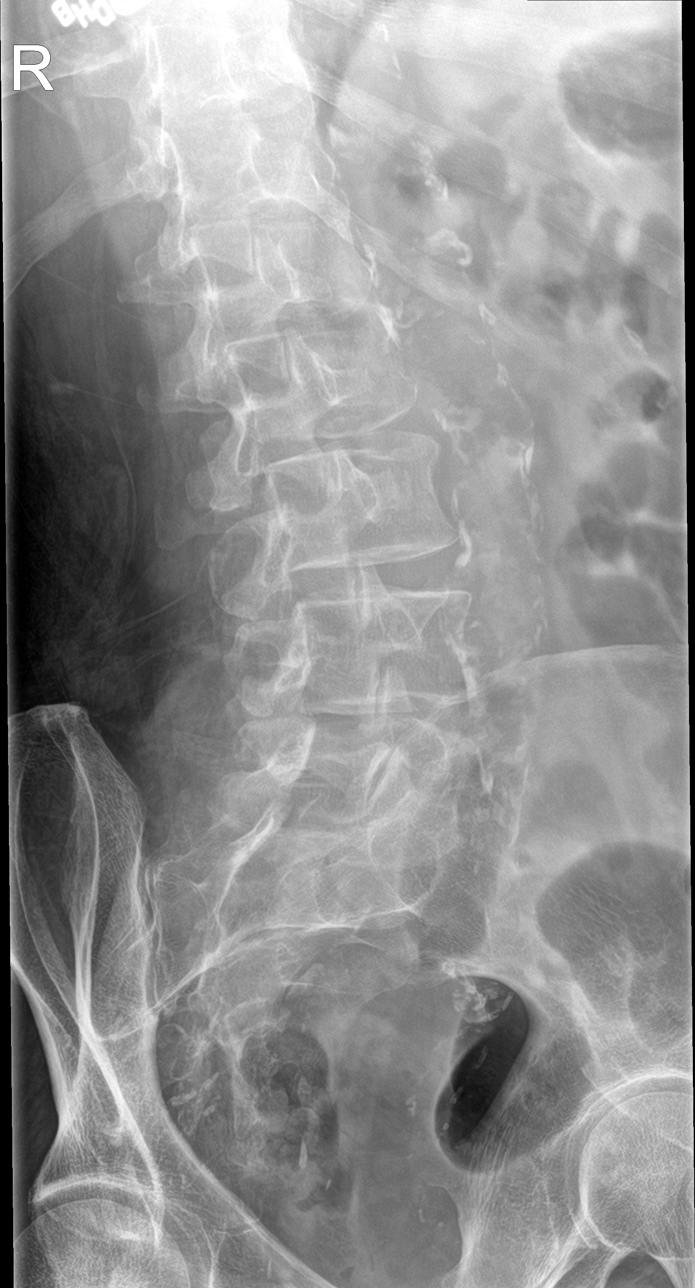

[l-spine lat]
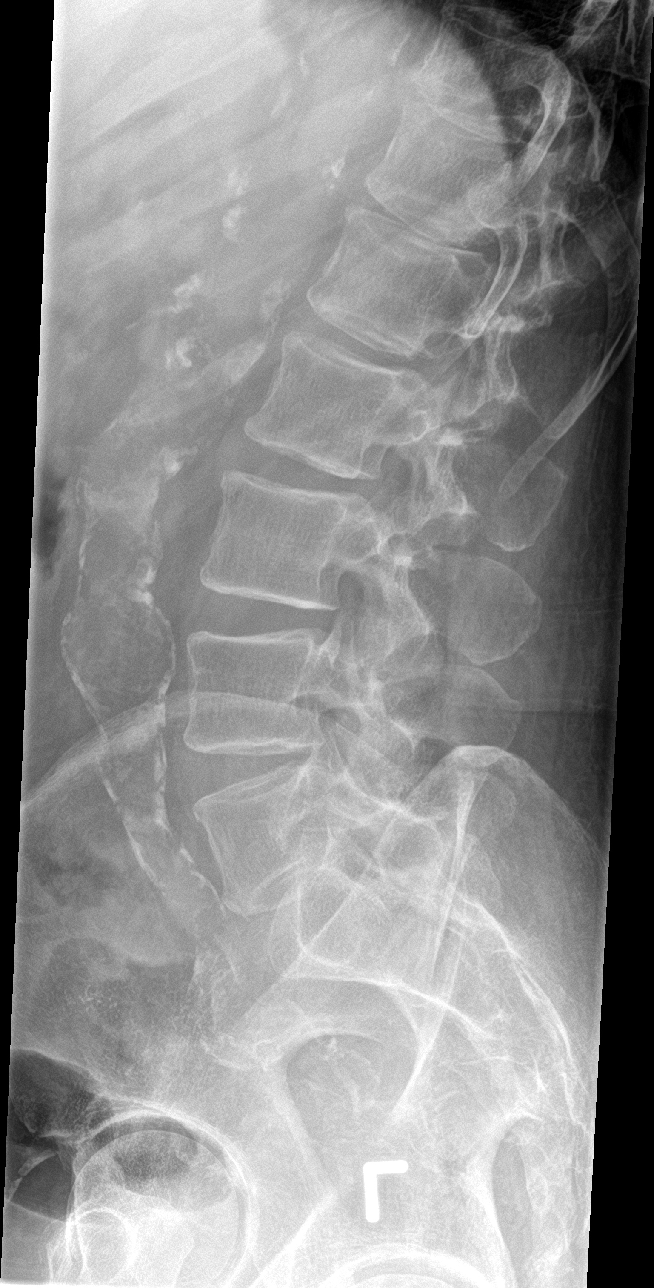

[l-spine spot]
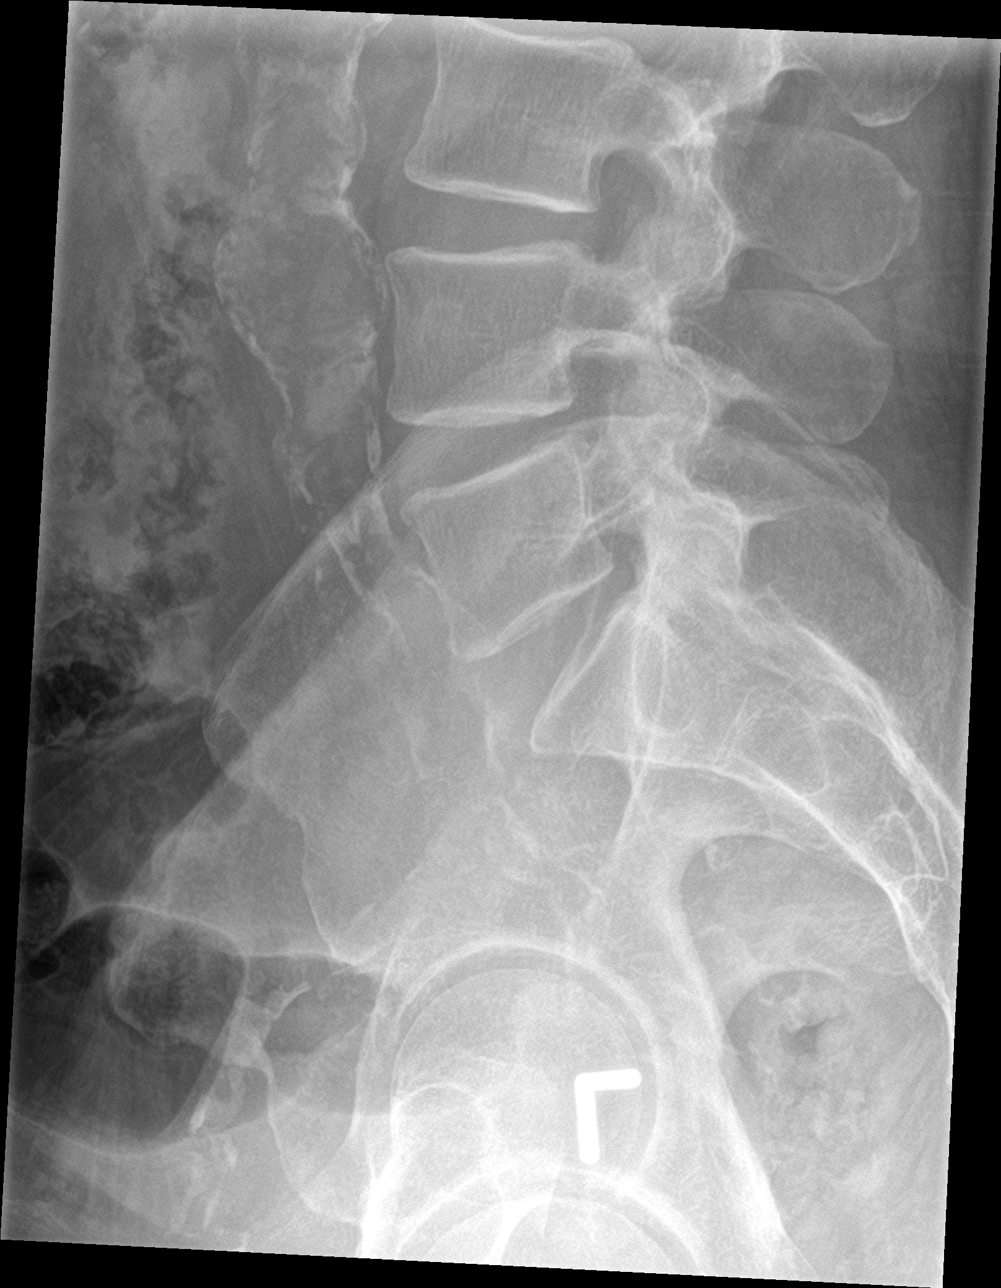

[5 of 5 positions shown; findings below may reference images not displayed]

FINDINGS: Normal lumbar lordosis. No acute fracture or listhesis of the lumbar
spine. Vertebral body height and intervertebral disc heights are
preserved. Extensive atherosclerotic calcification noted within the
aortoiliac vasculature. Minimum 3.5 cm infrarenal abdominal aortic
aneurysm noted, not well assessed on this examination.
IMPRESSION: No acute fracture or listhesis of the lumbar spine.

3.5 cm minimum diameter infrarenal abdominal aortic aneurysm. This
would be better assessed with dedicated CT or ultrasound imaging, if
clinically indicated.

## 2022-07-31 ENCOUNTER — Encounter: Payer: Self-pay | Admitting: Pharmacy Technician

## 2022-07-31 ENCOUNTER — Ambulatory Visit: Payer: Medicaid Other | Attending: Family | Admitting: Family

## 2022-07-31 ENCOUNTER — Encounter: Payer: Self-pay | Admitting: Family

## 2022-07-31 VITALS — BP 100/77 | HR 98 | Resp 18 | Wt 187.2 lb

## 2022-07-31 DIAGNOSIS — I10 Essential (primary) hypertension: Secondary | ICD-10-CM

## 2022-07-31 DIAGNOSIS — F319 Bipolar disorder, unspecified: Secondary | ICD-10-CM | POA: Insufficient documentation

## 2022-07-31 DIAGNOSIS — Z720 Tobacco use: Secondary | ICD-10-CM

## 2022-07-31 DIAGNOSIS — F17210 Nicotine dependence, cigarettes, uncomplicated: Secondary | ICD-10-CM | POA: Diagnosis not present

## 2022-07-31 DIAGNOSIS — I472 Ventricular tachycardia, unspecified: Secondary | ICD-10-CM

## 2022-07-31 DIAGNOSIS — Z8673 Personal history of transient ischemic attack (TIA), and cerebral infarction without residual deficits: Secondary | ICD-10-CM | POA: Insufficient documentation

## 2022-07-31 DIAGNOSIS — I130 Hypertensive heart and chronic kidney disease with heart failure and stage 1 through stage 4 chronic kidney disease, or unspecified chronic kidney disease: Secondary | ICD-10-CM | POA: Diagnosis present

## 2022-07-31 DIAGNOSIS — I2510 Atherosclerotic heart disease of native coronary artery without angina pectoris: Secondary | ICD-10-CM | POA: Diagnosis not present

## 2022-07-31 DIAGNOSIS — Z955 Presence of coronary angioplasty implant and graft: Secondary | ICD-10-CM | POA: Insufficient documentation

## 2022-07-31 DIAGNOSIS — I5022 Chronic systolic (congestive) heart failure: Secondary | ICD-10-CM

## 2022-07-31 DIAGNOSIS — I252 Old myocardial infarction: Secondary | ICD-10-CM | POA: Insufficient documentation

## 2022-07-31 DIAGNOSIS — Z79899 Other long term (current) drug therapy: Secondary | ICD-10-CM | POA: Insufficient documentation

## 2022-07-31 NOTE — Patient Instructions (Signed)
Continue weighing daily and call for an overnight weight gain of 3 pounds or more or a weekly weight gain of more than 5 pounds.  °

## 2022-07-31 NOTE — Progress Notes (Signed)
Patient ID: Carl [name], male    DOB: [birth year], 62 y.o.   MRN: [id number]  HPI  Carl [name] is a 62 y/o male with a history of HTN, CKD, stroke, COPD, NSTEMI, bipolar, depression, seizures, current tobacco use and chronic heart failure.   Echo report from 02/16/22 reviewed and showed an EF of 25-30% along with severe LAE and mild Carl. Echo report from 01/24/20 reviewed and showed an EF of 25-30% along with mildly elevated PA pressure, LAE and mild/moderate Carl. Echo report from 11/18/19 reviewed and showed an EF of 25-30%.  RHC/LHC done 04/25/22 and showed: Moderate-severe multivessel coronary artery disease, including 50% proximal LAD stenosis (mid/distal LAD are small with prominent first septal branch consistent with "double-barrel LAD), occlusion of large proximal D1 (distal vessel is supplied by patent LIMA graft), moderate diffuse LCx disease, occluded OM2 branch supplied by ectatic but patent SVG, and sequential proximal through distal RCA disease of up to 80-90% that is not hemodynamically significant (iFR = 0.99). Widely patent LIMA-D1. Ectatic but patent SVG-OM2. Patent overlapping proximal through distal RCA stents with mild in-stent restenosis in the proximal segment. Moderately elevated left heart filling pressures (LVEDP 25-30 mmHg, PWCP 30 mmHg with prominent V-waves). Moderately-severely elevated right heart and pulmonary artery pressures (mean RA 15 mmHg, mean PAP 42 mmHg). Low normal to mildly reduced Fick cardiac output/index (Fick CO/CI, 4.9 L/min, 2.4 L/min/m^2).  Admitted 05/22/22 due to ICD discharge. Amiodarone increased. Admitted 04/22/22 due to acute onset of worsening dyspnea at rest with orthopnea and lower extremity edema. Cardiology consult obtained. Digoxin stopped. Continue amiodarone. Cath done. Discharged after 8 days. Admitted 03/22/22 due to SOB and chest pain due to HF/ COPD exacerbation. Found to be hypoxic and covid +. Started on IV Remdisivir and IV steroids.  Cardiology consult obtained. Initially needed bipap but then weaned off. Elevated troponin thought to be due to demand ischemia. Given IV lasix with transition to oral diuretics. Discharged after 10 days. Admitted 03/14/22 due to epigastric abdominal and left-sided chest pain due to COPD exacerbation. Given IV steroids, bronchodilators and IV lasix. Palliative care consult done. Elevated troponins thought to be due to demand ischemia. Discharged after 7 days.   He presents today for a follow-up visit with a chief complaint of minimal fatigue with moderate exertion. Describes this as chronic in nature. Has no other symptoms and specifically denies any difficulty sleeping, dizziness, abdominal distention, palpitations, pedal edema, chest pain, wheezing, shortness of breath, cough or weight gain.   Does show me an area on the right side of his upper abdomin that feels boggy in nature. No pain. Advised his caregiver to have facility MD look at this and caregiver says that she is coming to facility later today.     Past Medical History:  Diagnosis Date   Acute pulmonary edema ([place]) 2017   Acute respiratory failure with hypoxia ([place]) 2017   Bipolar 1 disorder (HCC)    CAD (coronary artery disease)    a. Prior RCA stenting; b. 2017 s/p CABG x 2 (LIMA->D1, VG->OM2); b. 04/2022 Demand Isch/Cath: LM mild dzs, LAD 60m, D1 100, LCX mod diff dzs, OM2 100, OM3 50, RCA 40/20p ISR, 85d (iFR 0.99), 50d, RPAV 60, LIMA->D1 small, patent, VG->OM2 large, mild dzs-->Med Rx.   Chronic HFrEF (heart failure with reduced ejection fraction) ([place])    a. 02/2022 Echo: EF 25-30%, mild conc LVH, nl RV fxn, sev dil LA, mildly dil RA, mild Carl/AI.   CKD (chronic kidney disease), stage III (  HCC)    COPD (chronic obstructive pulmonary disease) (HCC)    Depression    Hypertension    Ischemic cardiomyopathy    a. 04/2020 s/p MDT MRI compatible CRT-D (Ser # [id number]); b. 02/2022 Echo: EF 25-30%.   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   Seizures (HCC)    childhood   Stroke [facility])    Tobacco abuse    Past Surgical History:  Procedure Laterality Date   BIV UPGRADE N/A 05/05/2020   Procedure: BIV ICD UPGRADE;  Surgeon: Duke Salvia, MD;  Location: [facility] INVASIVE CV LAB;  Service: Cardiovascular;  Laterality: N/A;   CORONARY ARTERY BYPASS GRAFT     INTRAVASCULAR PRESSURE WIRE/FFR STUDY N/A 04/25/2022   Procedure: INTRAVASCULAR PRESSURE WIRE/FFR STUDY;  Surgeon: Yvonne Kendall, MD;  Location: [facility] INVASIVE CV LAB;  Service: Cardiovascular;  Laterality: N/A;   RIGHT/LEFT HEART CATH AND CORONARY/GRAFT ANGIOGRAPHY N/A 04/25/2022   Procedure: RIGHT/LEFT HEART CATH AND CORONARY/GRAFT ANGIOGRAPHY;  Surgeon: Yvonne Kendall, MD;  Location: [facility] INVASIVE CV LAB;  Service: Cardiovascular;  Laterality: N/A;   Family History  Problem Relation Age of Onset   Cancer Mother        "femal cancer"   Prostate cancer Father    Social History   Tobacco Use   Smoking status: Every Day    Packs/day: 0.25    Years: 0.00    Total pack years: 0.00    Types: Cigarettes   Smokeless tobacco: Never  Substance Use Topics   Alcohol use: Not Currently   Allergies  Allergen Reactions   Iodinated Contrast Media Shortness Of Breath   Penicillins Anaphylaxis and Shortness Of Breath    Respiratory  Tolerated cefuroxime on 01/06/16   Strawberry Extract Anaphylaxis   Cefepime Itching    Empiric antibiotic, developed pruritis.    Erythromycin Itching   Sulfa Antibiotics Itching, Nausea And Vomiting and Nausea Only   Prior to Admission medications   Medication Sig Start Date End Date Taking? Authorizing Provider  acetaminophen (TYLENOL) 500 MG tablet Take 500 mg by mouth every 6 (six) hours as needed for mild pain.   Yes [provider]  albuterol (PROVENTIL) (2.5 MG/3ML) 0.083% nebulizer solution Take 3 mLs (2.5 mg  total) by nebulization every 6 (six) hours as needed for wheezing or shortness of breath. 05/25/22  Yes Wieting, Richard, MD  albuterol (VENTOLIN HFA) 108 (90 Base) MCG/ACT inhaler Inhale 2 puffs into the lungs every 6 (six) hours as needed for wheezing. 05/25/22  Yes Wieting, Richard, MD  amiodarone (PACERONE) 400 MG tablet Take 1 tablet (400 mg total) by mouth daily. 06/14/22  Yes Furth, Cadence H, PA-C  apixaban (ELIQUIS) 5 MG TABS tablet TAKE 1 TABLET BY MOUTH TWICE A DAY Patient taking differently: Take 5 mg by mouth 2 (two) times daily. 01/18/21  Yes Duke Salvia, MD  dapagliflozin propanediol (FARXIGA) 10 MG TABS tablet Take 1 tablet (10 mg total) by mouth daily. 05/01/22  Yes Enedina Finner, MD  divalproex (DEPAKOTE ER) 500 MG 24 hr tablet Take 1 tablet (500 mg total) by mouth 2 (two) times daily. 05/24/19  Yes Clapacs, Jackquline Denmark, MD  escitalopram (LEXAPRO) 10 MG tablet Take 1 tablet (10 mg total) by mouth at bedtime. 05/24/19  Yes Clapacs, Jackquline Denmark, MD  ezetimibe (ZETIA) 10 MG tablet Take 1 tablet (10 mg total) by mouth daily. 04/01/22  Yes Rito Ehrlich, Francene Castle  K, MD  furosemide (LASIX) 20 MG tablet Take 3 tablets (60 mg total) by mouth 2 (two) times daily. 05/25/22  Yes Wieting, Richard, MD  HYDROcodone-acetaminophen (NORCO/VICODIN) 5-325 MG tablet Take 1 tablet by mouth every 6 (six) hours as needed for severe pain. 05/25/22  Yes Wieting, Richard, MD  Hydrocortisone Acetate 1 % CREA Apply TID 05/15/22  Yes [provider]  hydrOXYzine (ATARAX) 10 MG tablet Take 10 mg by mouth 2 (two) times daily. 12/24/21  Yes [provider]  isosorbide mononitrate (IMDUR) 30 MG 24 hr tablet Take 0.5 tablets (15 mg total) by mouth daily. 05/26/22  Yes Loletha Grayer, MD  levETIRAcetam (KEPPRA) 250 MG tablet Take 1 tablet (250 mg total) by mouth 2 (two) times daily. 03/24/20  Yes Harvest Dark, MD  levothyroxine (SYNTHROID) 25 MCG tablet Take 1 tablet (25 mcg total) by mouth daily. Patient taking  differently: Take 50 mcg by mouth daily. 05/24/19  Yes Clapacs, Madie Reno, MD  losartan (COZAAR) 25 MG tablet Take 0.5 tablets (12.5 mg total) by mouth daily. 05/01/22  Yes Fritzi Mandes, MD  metoprolol succinate (TOPROL-XL) 25 MG 24 hr tablet Take 0.5 tablets (12.5 mg total) by mouth at bedtime. 04/30/22  Yes Fritzi Mandes, MD  Multiple Vitamin (MULTIVITAMIN WITH MINERALS) TABS tablet Take 1 tablet by mouth daily. 05/01/22  Yes Fritzi Mandes, MD  nitroGLYCERIN (NITROSTAT) 0.4 MG SL tablet Place 1 tablet (0.4 mg total) under the tongue every 5 (five) minutes as needed for chest pain. 05/25/22  Yes Wieting, Richard, MD  omeprazole (PRILOSEC) 20 MG capsule Take 20 mg by mouth daily.   Yes [provider]  potassium chloride SA (KLOR-CON M) 20 MEQ tablet Take 2 tablets (40 mEq total) by mouth daily. 06/14/22  Yes Furth, Cadence H, PA-C  QUEtiapine (SEROQUEL) 200 MG tablet Take 200 mg by mouth at bedtime.   Yes [provider]  rosuvastatin (CRESTOR) 10 MG tablet Take 1 tablet (10 mg total) by mouth daily. 05/16/22  Yes Juliannah Ohmann, Otila Kluver A, FNP  spironolactone (ALDACTONE) 25 MG tablet Take 1 tablet (25 mg total) by mouth daily. 05/26/22  Yes Wieting, Richard, MD  tiotropium (SPIRIVA) 18 MCG inhalation capsule Place 18 mcg into inhaler and inhale daily.   Yes [provider]  traMADol (ULTRAM) 50 MG tablet Take 50 mg by mouth every 12 (twelve) hours as needed for moderate pain.   Yes [provider]   Review of Systems  Constitutional:  Positive for fatigue. Negative for appetite change.  HENT:  Negative for congestion, postnasal drip and sore throat.   Eyes: Negative.   Respiratory:  Negative for cough, shortness of breath and wheezing.   Cardiovascular:  Negative for chest pain, palpitations and leg swelling.  Gastrointestinal:  Negative for abdominal distention and abdominal pain.  Endocrine: Negative.   Genitourinary: Negative.   Musculoskeletal:  Negative for arthralgias  and gait problem.  Skin: Negative.   Allergic/Immunologic: Negative.   Neurological:  Negative for dizziness and light-headedness.  Hematological:  Negative for adenopathy. Does not bruise/bleed easily.  Psychiatric/Behavioral:  Negative for dysphoric mood and sleep disturbance (sleeping on 2 pillows). The patient is not nervous/anxious.    Vitals:   07/31/22 1003  BP: 100/77  Pulse: 98  Resp: 18  SpO2: 99%  Weight: 187 lb 4 oz (84.9 kg)   Wt Readings from Last 3 Encounters:  07/31/22 187 lb 4 oz (84.9 kg)  06/14/22 183 lb (83 kg)  06/04/22 182 lb 4 oz (82.7  kg)   Lab Results  Component Value Date   CREATININE 1.65 (H) 06/14/2022   CREATININE 1.81 (H) 06/04/2022   CREATININE 1.23 05/25/2022   Physical Exam Vitals and nursing note reviewed. Exam conducted with a chaperone present (caregiver from facility).  Constitutional:      Appearance: Normal appearance.  HENT:     Head: Normocephalic and atraumatic.  Cardiovascular:     Rate and Rhythm: Normal rate and regular rhythm.  Pulmonary:     Effort: Pulmonary effort is normal. No respiratory distress.     Breath sounds: Wheezing (BUL) present. No rales.  Abdominal:     Palpations: Abdomen is soft. There is mass (Right upper abd/ lower chest wall).     Tenderness: There is no abdominal tenderness.  Musculoskeletal:        General: No tenderness.     Cervical back: Normal range of motion and neck supple.     Right lower leg: No edema.     Left lower leg: No edema.  Skin:    General: Skin is warm and dry.  Neurological:     General: No focal deficit present.     Mental Status: He is alert and oriented to person, place, and time.  Psychiatric:        Mood and Affect: Mood is anxious.        Behavior: Behavior normal.    Assessment & Plan:  1: Chronic heart failure with reduced ejection fraction- - NYHA class II - euvolemic today - weighing daily & weight chart reviewed, wt ranges from 184-190 pounds over this  month; reminded to call for an overnight weight gain of >2 pounds or a weekly weight gain of >5 pounds - weight up 4 pounds from last visit here 3 months ago - saw cardiology Fransico Michael) 06/14/22 - on GDMT of farxiga, losartan, metoprolol and spironolactone - current BP does not allow for GDMT escalation - check BMP next visit - saw [facility] MD (Bensimhon) 06/04/22 - BNP 06/04/22 was 153.9 - PharmD reconciled medications  2: HTN- - BP 100/77 - [facility] is his primary care - BMP 06/14/22 reviewed and showed sodium 135, potassium 4.2, creatinine 1.65 and GFR 46  3: Tobacco use- - does smoke 10 cigarettes daily  4: VT/VF arrest with ICD shock- - Had VT/VF  in 10/23, Cath at time with stable CAD - Recurrent VT in 11/23 K 2.7 at time - Continue K supplementation Keep K > 4.0 Mg > 2.0  - Continue amio now at 400 daily  - suspect scar-mediated VT vs end-stage CM - saw EP Graciela Husbands) 08/21/21 - order written for BMP to be drawn on 08/08/22 with results faxed to [place]   Facility medication list reviewed.   Return in 2 months, sooner if needed.

## 2022-08-01 NOTE — Progress Notes (Signed)
[place] - PHARMACIST COUNSELING NOTE  Guideline-Directed Medical Therapy/Evidence Based Medicine  ACE/ARB/ARNI: Losartan 12.5 mg daily Beta Blocker: Metoprolol succinate 12.5 mg daily Aldosterone Antagonist: Spironolactone 25 mg daily Diuretic:  None SGLT2i: Dapagliflozin 10 mg daily  Adherence Assessment  Do you ever forget to take your medication? [] Yes [x] No  Do you ever skip doses due to side effects? [] Yes [x] No  Do you have trouble affording your medicines? [] Yes [x] No  Are you ever unable to pick up your medication due to transportation difficulties? [] Yes [x] No  Do you ever stop taking your medications because you don't believe they are helping? [] Yes [x] No  Do you check your weight daily? [x] Yes [] No   Adherence strategy: Living facility administers his medications  Barriers to obtaining medications: None  Vital signs: HR 98, BP 100/77, weight (pounds) 187 ECHO: Date 04/2022, EF 25-30%     Latest Ref Rng & Units 06/14/2022   12:36 PM 06/04/2022   12:34 PM 05/25/2022    5:14 AM  BMP  Glucose 70 - 99 mg/dL 92  95  141   BUN 8 - 23 mg/dL 20  30  28    Creatinine 0.61 - 1.24 mg/dL 1.65  1.81  1.23   Sodium 135 - 145 mmol/L 135  130  137   Potassium 3.5 - 5.1 mmol/L 4.2  4.5  4.3   Chloride 98 - 111 mmol/L 97  93  100   CO2 22 - 32 mmol/L 29  23  27    Calcium 8.9 - 10.3 mg/dL 9.3  9.9  10.2     Past Medical History:  Diagnosis Date   Acute pulmonary edema ([place]) 2017   Acute respiratory failure with hypoxia ([place]) 2017   Bipolar 1 disorder (HCC)    CAD (coronary artery disease)    a. Prior RCA stenting; b. 2017 s/p CABG x 2 (LIMA->D1, VG->OM2); b. 04/2022 Demand Isch/Cath: LM mild dzs, LAD 66m, D1 100, LCX mod diff dzs, OM2 100, OM3 50, RCA 40/20p ISR, 85d (iFR 0.99), 50d, RPAV 60, LIMA->D1 small, patent, VG->OM2 large, mild dzs-->Med Rx.   Chronic HFrEF (heart failure with reduced ejection fraction) ([place])    a.  02/2022 Echo: EF 25-30%, mild conc LVH, nl RV fxn, sev dil LA, mildly dil RA, mild MR/AI.   CKD (chronic kidney disease), stage III (HCC)    COPD (chronic obstructive pulmonary disease) ([place])    Depression    Hypertension    Ischemic cardiomyopathy    a. 04/2020 s/p MDT MRI compatible CRT-D (Ser # [id number]); b. 02/2022 Echo: EF 25-30%.   Myocardial infarction [facility])    NSTEMI (non-ST elevated myocardial infarction) [facility])    RVAD (right ventricular assist device) present ([place]) 01/06/2016   Seizures ([place])    childhood   Stroke [facility])    Tobacco abuse     ASSESSMENT 62 year old male with PMH CAD w/ NSTEMI, CVA (2020), PAD, HTN, Vfib w/ AICD, T2DM, HLD, tobacco use who presents to the HF clinic for follow-up. Most recent ECHO in 02/2022 shows EF 25-30%. Regarding GDMT, patient takes Toprol XL 12.5 mg daily, losartan 12.5 mg daily, spironolactone 25 mg daily, and Farxiga 10 mg daily. Blood pressure is too low to switch losartan to Entresto nor will it permit escalation of GDMT dosing. Patient resides at a facility where his medications are administered to him and they monitor his blood pressure and weight daily.  Recent ED Visit (past 6 months): Date -  05/2022, CC - AICD discharge Date - 04/2022, CC - SOB Date - 03/22/2022, CC - COPD exacerbation Date - 03/22/2022, CC - COPD exacerbation Date - 03/05/2022, CC - Suicidal ideation Date - 02/15/2022, CC - Dyspnea  PLAN CHF/HTN Continue Toprol XL, losartan, spironolactone, Farxiga Blood pressure is too low to switch losartan to Entresto nor will it permit escalation of GDMT dosing.  HLD and CAD w/ NSTEMI 03/2022 LDL 67 Continue Imdur, rosuvastatin, and ezetimibe   Time spent: 15 minutes  Will M. Ouida Sills, PharmD PGY-1 Pharmacy Resident 08/01/2022 7:40 PM    Current Outpatient Medications:    acetaminophen (TYLENOL) 500 MG tablet, Take 500 mg by mouth every 6 (six) hours as needed for mild pain., Disp: , Rfl:    albuterol (PROVENTIL) (2.5  MG/3ML) 0.083% nebulizer solution, Take 3 mLs (2.5 mg total) by nebulization every 6 (six) hours as needed for wheezing or shortness of breath., Disp: 360 mL, Rfl: 0   albuterol (VENTOLIN HFA) 108 (90 Base) MCG/ACT inhaler, Inhale 2 puffs into the lungs every 6 (six) hours as needed for wheezing., Disp: 18 g, Rfl: 0   amiodarone (PACERONE) 400 MG tablet, Take 1 tablet (400 mg total) by mouth daily., Disp: 90 tablet, Rfl: 3   apixaban (ELIQUIS) 5 MG TABS tablet, TAKE 1 TABLET BY MOUTH TWICE A DAY (Patient taking differently: Take 5 mg by mouth 2 (two) times daily.), Disp: 60 tablet, Rfl: 6   dapagliflozin propanediol (FARXIGA) 10 MG TABS tablet, Take 1 tablet (10 mg total) by mouth daily., Disp: 30 tablet, Rfl: 1   divalproex (DEPAKOTE ER) 500 MG 24 hr tablet, Take 1 tablet (500 mg total) by mouth 2 (two) times daily., Disp: 60 tablet, Rfl: 1   escitalopram (LEXAPRO) 10 MG tablet, Take 1 tablet (10 mg total) by mouth at bedtime., Disp: 30 tablet, Rfl: 1   ezetimibe (ZETIA) 10 MG tablet, Take 1 tablet (10 mg total) by mouth daily., Disp: 30 tablet, Rfl: 1   furosemide (LASIX) 20 MG tablet, Take 3 tablets (60 mg total) by mouth 2 (two) times daily., Disp: 180 tablet, Rfl: 0   HYDROcodone-acetaminophen (NORCO/VICODIN) 5-325 MG tablet, Take 1 tablet by mouth every 6 (six) hours as needed for severe pain., Disp: 10 tablet, Rfl: 0   Hydrocortisone Acetate 1 % CREA, Apply TID, Disp: , Rfl:    hydrOXYzine (ATARAX) 10 MG tablet, Take 10 mg by mouth 2 (two) times daily., Disp: , Rfl:    isosorbide mononitrate (IMDUR) 30 MG 24 hr tablet, Take 0.5 tablets (15 mg total) by mouth daily., Disp: 15 tablet, Rfl: 0   levETIRAcetam (KEPPRA) 250 MG tablet, Take 1 tablet (250 mg total) by mouth 2 (two) times daily., Disp: 60 tablet, Rfl: 0   levothyroxine (SYNTHROID) 25 MCG tablet, Take 1 tablet (25 mcg total) by mouth daily. (Patient taking differently: Take 50 mcg by mouth daily.), Disp: 30 tablet, Rfl: 1   losartan  (COZAAR) 25 MG tablet, Take 0.5 tablets (12.5 mg total) by mouth daily., Disp: 30 tablet, Rfl: 1   metoprolol succinate (TOPROL-XL) 25 MG 24 hr tablet, Take 0.5 tablets (12.5 mg total) by mouth at bedtime., Disp: 30 tablet, Rfl: 1   Multiple Vitamin (MULTIVITAMIN WITH MINERALS) TABS tablet, Take 1 tablet by mouth daily., Disp: 30 tablet, Rfl: 0   nitroGLYCERIN (NITROSTAT) 0.4 MG SL tablet, Place 1 tablet (0.4 mg total) under the tongue every 5 (five) minutes as needed for chest pain., Disp: 30 tablet, Rfl: 0  omeprazole (PRILOSEC) 20 MG capsule, Take 20 mg by mouth daily., Disp: , Rfl:    potassium chloride SA (KLOR-CON M) 20 MEQ tablet, Take 2 tablets (40 mEq total) by mouth daily., Disp: 180 tablet, Rfl: 3   QUEtiapine (SEROQUEL) 200 MG tablet, Take 200 mg by mouth at bedtime., Disp: , Rfl:    rosuvastatin (CRESTOR) 10 MG tablet, Take 1 tablet (10 mg total) by mouth daily., Disp: 30 tablet, Rfl: 6   spironolactone (ALDACTONE) 25 MG tablet, Take 1 tablet (25 mg total) by mouth daily., Disp: 30 tablet, Rfl: 0   tiotropium (SPIRIVA) 18 MCG inhalation capsule, Place 18 mcg into inhaler and inhale daily., Disp: , Rfl:    traMADol (ULTRAM) 50 MG tablet, Take 50 mg by mouth every 12 (twelve) hours as needed for moderate pain., Disp: , Rfl:    DRUGS TO CAUTION IN HEART FAILURE  Drug or Class Mechanism  Analgesics NSAIDs COX-2 inhibitors Glucocorticoids  Sodium and water retention, increased systemic vascular resistance, decreased response to diuretics   Diabetes Medications Metformin Thiazolidinediones Rosiglitazone (Avandia) Pioglitazone (Actos) DPP4 Inhibitors Saxagliptin (Onglyza) Sitagliptin (Januvia)   Lactic acidosis Possible calcium channel blockade   Unknown  Antiarrhythmics Class I  Flecainide Disopyramide Class III Sotalol Other Dronedarone  Negative inotrope, proarrhythmic   Proarrhythmic, beta blockade  Negative inotrope  Antihypertensives Alpha  Blockers Doxazosin Calcium Channel Blockers Diltiazem Verapamil Nifedipine Central Alpha Adrenergics Moxonidine Peripheral Vasodilators Minoxidil  Increases renin and aldosterone  Negative inotrope    Possible sympathetic withdrawal  Unknown  Anti-infective Itraconazole Amphotericin B  Negative inotrope Unknown  Hematologic Anagrelide Cilostazol   Possible inhibition of PD IV Inhibition of PD III causing arrhythmias  Neurologic/Psychiatric Stimulants Anti-Seizure Drugs Carbamazepine Pregabalin Antidepressants Tricyclics Citalopram Parkinsons Bromocriptine Pergolide Pramipexole Antipsychotics Clozapine Antimigraine Ergotamine Methysergide Appetite suppressants Bipolar Lithium  Peripheral alpha and beta agonist activity  Negative inotrope and chronotrope Calcium channel blockade  Negative inotrope, proarrhythmic Dose-dependent QT prolongation  Excessive serotonin activity/valvular damage Excessive serotonin activity/valvular damage Unknown  IgE mediated hypersensitivy, calcium channel blockade  Excessive serotonin activity/valvular damage Excessive serotonin activity/valvular damage Valvular damage  Direct myofibrillar degeneration, adrenergic stimulation  Antimalarials Chloroquine Hydroxychloroquine Intracellular inhibition of lysosomal enzymes  Urologic Agents Alpha Blockers Doxazosin Prazosin Tamsulosin Terazosin  Increased renin and aldosterone  Adapted from Page Williemae Natter, et al. "Drugs That May Cause or Exacerbate Heart Failure: A Scientific Statement from the American Heart  Association." Circulation 2016; 134:e32-e69. DOI: 10.1161/CIR.0000000000000426   MEDICATION ADHERENCES TIPS AND STRATEGIES Taking medication as prescribed improves patient outcomes in heart failure (reduces hospitalizations, improves symptoms, increases survival) Side effects of medications can be managed by decreasing doses, switching agents, stopping drugs, or  adding additional therapy. Please let someone in the Heart Failure Clinic know if you have having bothersome side effects so we can modify your regimen. Do not alter your medication regimen without talking to [place].  Medication reminders can help patients remember to take drugs on time. If you are missing or forgetting doses you can try linking behaviors, using pill boxes, or an electronic reminder like an alarm on your phone or an app. Some people can also get automated phone calls as medication reminders.

## 2022-08-02 ENCOUNTER — Ambulatory Visit (INDEPENDENT_AMBULATORY_CARE_PROVIDER_SITE_OTHER): Payer: Medicaid Other

## 2022-08-02 DIAGNOSIS — I255 Ischemic cardiomyopathy: Secondary | ICD-10-CM | POA: Diagnosis not present

## 2022-08-02 LAB — CUP PACEART REMOTE DEVICE CHECK
Battery Remaining Longevity: 54 mo
Battery Voltage: 2.97 V
Brady Statistic AP VP Percent: 36.15 %
Brady Statistic AP VS Percent: 0.22 %
Brady Statistic AS VP Percent: 61.6 %
Brady Statistic AS VS Percent: 2.03 %
Brady Statistic RA Percent Paced: 36.21 %
Brady Statistic RV Percent Paced: 96.71 %
Date Time Interrogation Session: 20240126022823
HighPow Impedance: 85 Ohm
Implantable Lead Connection Status: 753985
Implantable Lead Connection Status: 753985
Implantable Lead Connection Status: 753985
Implantable Lead Implant Date: 20171201
Implantable Lead Implant Date: 20171201
Implantable Lead Implant Date: 20211029
Implantable Lead Location: 753858
Implantable Lead Location: 753859
Implantable Lead Location: 753860
Implantable Lead Model: 5076
Implantable Pulse Generator Implant Date: 20211029
Lead Channel Impedance Value: 218.087
Lead Channel Impedance Value: 230.327
Lead Channel Impedance Value: 232.653
Lead Channel Impedance Value: 241.412
Lead Channel Impedance Value: 246.635
Lead Channel Impedance Value: 418 Ohm
Lead Channel Impedance Value: 418 Ohm
Lead Channel Impedance Value: 456 Ohm
Lead Channel Impedance Value: 475 Ohm
Lead Channel Impedance Value: 513 Ohm
Lead Channel Impedance Value: 608 Ohm
Lead Channel Impedance Value: 703 Ohm
Lead Channel Impedance Value: 703 Ohm
Lead Channel Impedance Value: 722 Ohm
Lead Channel Impedance Value: 722 Ohm
Lead Channel Impedance Value: 817 Ohm
Lead Channel Impedance Value: 817 Ohm
Lead Channel Impedance Value: 874 Ohm
Lead Channel Pacing Threshold Amplitude: 0.5 V
Lead Channel Pacing Threshold Amplitude: 0.875 V
Lead Channel Pacing Threshold Amplitude: 2.5 V
Lead Channel Pacing Threshold Pulse Width: 0.4 ms
Lead Channel Pacing Threshold Pulse Width: 0.4 ms
Lead Channel Pacing Threshold Pulse Width: 0.4 ms
Lead Channel Sensing Intrinsic Amplitude: 0.75 mV
Lead Channel Sensing Intrinsic Amplitude: 0.75 mV
Lead Channel Sensing Intrinsic Amplitude: 14.5 mV
Lead Channel Sensing Intrinsic Amplitude: 14.5 mV
Lead Channel Setting Pacing Amplitude: 1.5 V
Lead Channel Setting Pacing Amplitude: 1.5 V
Lead Channel Setting Pacing Amplitude: 2.5 V
Lead Channel Setting Pacing Pulse Width: 0.4 ms
Lead Channel Setting Pacing Pulse Width: 0.8 ms
Lead Channel Setting Sensing Sensitivity: 0.3 mV
Zone Setting Status: 755011
Zone Setting Status: 755011

## 2022-08-03 ENCOUNTER — Emergency Department
Admission: EM | Admit: 2022-08-03 | Discharge: 2022-08-03 | Disposition: A | Discharge status: Other Acute Inpatient Hospital | Payer: Medicaid Other | Attending: Emergency Medicine | Admitting: Emergency Medicine

## 2022-08-03 ENCOUNTER — Other Ambulatory Visit: Payer: Self-pay

## 2022-08-03 DIAGNOSIS — F0390 Unspecified dementia without behavioral disturbance: Secondary | ICD-10-CM | POA: Diagnosis not present

## 2022-08-03 DIAGNOSIS — Y900 Blood alcohol level of less than 20 mg/100 ml: Secondary | ICD-10-CM | POA: Insufficient documentation

## 2022-08-03 DIAGNOSIS — Z950 Presence of cardiac pacemaker: Secondary | ICD-10-CM | POA: Diagnosis not present

## 2022-08-03 DIAGNOSIS — I472 Ventricular tachycardia, unspecified: Secondary | ICD-10-CM | POA: Insufficient documentation

## 2022-08-03 DIAGNOSIS — R0789 Other chest pain: Secondary | ICD-10-CM | POA: Insufficient documentation

## 2022-08-03 DIAGNOSIS — R079 Chest pain, unspecified: Secondary | ICD-10-CM

## 2022-08-03 LAB — COMPREHENSIVE METABOLIC PANEL
ALT: 21 U/L (ref 0–44)
AST: 35 U/L (ref 15–41)
Albumin: 3.6 g/dL (ref 3.5–5.0)
Alkaline Phosphatase: 60 U/L (ref 38–126)
Anion gap: 13 (ref 5–15)
BUN: 34 mg/dL — ABNORMAL HIGH (ref 8–23)
CO2: 23 mmol/L (ref 22–32)
Calcium: 9.1 mg/dL (ref 8.9–10.3)
Chloride: 93 mmol/L — ABNORMAL LOW (ref 98–111)
Creatinine, Ser: 2.28 mg/dL — ABNORMAL HIGH (ref 0.61–1.24)
GFR, Estimated: 31 mL/min — ABNORMAL LOW (ref >60)
Glucose, Bld: 193 mg/dL — ABNORMAL HIGH (ref 70–99)
Potassium: 4 mmol/L (ref 3.5–5.1)
Sodium: 129 mmol/L — ABNORMAL LOW (ref 135–145)
Total Bilirubin: 0.7 mg/dL (ref 0.3–1.2)
Total Protein: 7.8 g/dL (ref 6.5–8.1)

## 2022-08-03 LAB — CBC WITH DIFFERENTIAL/PLATELET
Abs Immature Granulocytes: 0.19 10*3/uL — ABNORMAL HIGH (ref 0.00–0.07)
Basophils Absolute: 0.1 10*3/uL (ref 0.0–0.1)
Basophils Relative: 1 %
Eosinophils Absolute: 0.4 10*3/uL (ref 0.0–0.5)
Eosinophils Relative: 5 %
HCT: 42.6 % (ref 39.0–52.0)
Hemoglobin: 14.2 g/dL (ref 13.0–17.0)
Immature Granulocytes: 2 %
Lymphocytes Relative: 28 %
Lymphs Abs: 2.3 10*3/uL (ref 0.7–4.0)
MCH: 29.1 pg (ref 26.0–34.0)
MCHC: 33.3 g/dL (ref 30.0–36.0)
MCV: 87.3 fL (ref 80.0–100.0)
Monocytes Absolute: 0.4 10*3/uL (ref 0.1–1.0)
Monocytes Relative: 5 %
Neutro Abs: 4.8 10*3/uL (ref 1.7–7.7)
Neutrophils Relative %: 59 %
Platelets: 223 10*3/uL (ref 150–400)
RBC: 4.88 MIL/uL (ref 4.22–5.81)
RDW: 17.1 % — ABNORMAL HIGH (ref 11.5–15.5)
WBC: 8.2 10*3/uL (ref 4.0–10.5)
nRBC: 0 % (ref 0.0–0.2)

## 2022-08-03 LAB — TROPONIN I (HIGH SENSITIVITY)
Troponin I (High Sensitivity): 88 ng/L — ABNORMAL HIGH (ref <18)
Troponin I (High Sensitivity): 144 ng/L (ref <18)

## 2022-08-03 LAB — BRAIN NATRIURETIC PEPTIDE: B Natriuretic Peptide: 715.9 pg/mL — ABNORMAL HIGH (ref 0.0–100.0)

## 2022-08-03 LAB — PHOSPHORUS: Phosphorus: 4.6 mg/dL (ref 2.5–4.6)

## 2022-08-03 LAB — MAGNESIUM: Magnesium: 2.4 mg/dL (ref 1.7–2.4)

## 2022-08-03 LAB — ETHANOL: Alcohol, Ethyl (B): <10 mg/dL (ref <10)

## 2022-08-03 MED ORDER — KETAMINE HCL 50 MG/5ML IJ SOSY
0.5000 mg/kg | PREFILLED_SYRINGE | Freq: Once | INTRAMUSCULAR | Status: AC
  Administered 2022-08-03: 42 mg via INTRAVENOUS
  Filled 2022-08-03: qty 5

## 2022-08-03 NOTE — ED Notes (Signed)
called to [facility] for transport to ED to ED/rep:sally

## 2022-08-03 NOTE — ED Notes (Signed)
called to [place] for attending per MD bradler .[name]

## 2022-08-03 NOTE — ED Notes (Signed)
EMTALA reviewed by this RN.  

## 2022-08-03 NOTE — ED Notes (Signed)
Repeat pacemaker interrogation successfully sent. [employer].

## 2022-08-03 NOTE — ED Notes (Signed)
called to [facility] per MD Bradler.Marland KitchenMarland Kitchen

## 2022-08-03 NOTE — ED Triage Notes (Signed)
Arrives EMS from [facility] assisted [facility] for new onset chest pain while laying in bed. Rated 9/10. Upon paramedic arrival found in wide complex VT. Takes 400mg  amiodarone daily but unsure if all meds taken from facility. Per staff alert and oriented to baseline. A/ox2. Compliant on Eliquis for afib.   Only compliant on arrival is for wash rag for sinuses.   150mg  amiodarone IV bolus administered en route.

## 2022-08-03 NOTE — ED Notes (Signed)
Pacemaker interrogated successfully. [employer].

## 2022-08-03 NOTE — ED Provider Notes (Signed)
[facility] Provider Note   Event Date/Time   First MD Initiated Contact with Patient 08/03/22 1930     (approximate) History  Chest Pain  HPI Carl [name] is a 62 y.o. male with a past medical history of dementia, atrial fibrillation, and intermittent ventricular tachycardia with pacemaker in place who presents for chest pain via EMS.  Patient states that the chest pain started just prior to calling EMS today.  Patient describes a left-sided chest pressure that does not radiate and has been stable since onset. ROS: Patient currently denies any vision changes, tinnitus, difficulty speaking, facial droop, sore throat, shortness of breath, abdominal pain, nausea/vomiting/diarrhea, dysuria, or weakness/numbness/paresthesias in any extremity   Physical Exam  Triage Vital Signs: ED Triage Vitals  Enc Vitals Group     BP      Pulse      Resp      Temp      Temp src      SpO2      Weight      Height      Head Circumference      Peak Flow      Pain Score      Pain Loc      Pain Edu?      Excl. in [place]?    Most recent vital signs: Vitals:   08/03/22 2330 08/03/22 2345  BP: 108/63 106/70  Pulse: 61 66  Resp: 17 19  Temp: 98.1 F (36.7 C)   SpO2: 97% 98%   General: Awake, oriented x4. CV:  Good peripheral perfusion.  Resp:  Normal effort.  Abd:  No distention.  Other:  Middle-aged overweight Caucasian male laying in stretcher in no acute distress ED Results / Procedures / Treatments  Labs (all labs ordered are listed, but only abnormal results are displayed) Labs Reviewed  BRAIN NATRIURETIC PEPTIDE - Abnormal; Notable for the following components:      Result Value   B Natriuretic Peptide 715.9 (*)    All other components within normal limits  COMPREHENSIVE METABOLIC PANEL - Abnormal; Notable for the following components:   Sodium 129 (*)    Chloride 93 (*)    Glucose, Bld 193 (*)    BUN 34 (*)    Creatinine, Ser 2.28 (*)    GFR, Estimated 31  (*)    All other components within normal limits  CBC WITH DIFFERENTIAL/PLATELET - Abnormal; Notable for the following components:   RDW 17.1 (*)    Abs Immature Granulocytes 0.19 (*)    All other components within normal limits  TROPONIN I (HIGH SENSITIVITY) - Abnormal; Notable for the following components:   Troponin I (High Sensitivity) 88 (*)    All other components within normal limits  TROPONIN I (HIGH SENSITIVITY) - Abnormal; Notable for the following components:   Troponin I (High Sensitivity) 144 (*)    All other components within normal limits  ETHANOL  MAGNESIUM  PHOSPHORUS   EKG ED ECG REPORT I, Naaman Plummer, the attending physician, personally viewed and interpreted this ECG. Date: 08/03/2022 EKG Time: 1935 Rate: 153 Rhythm: Tachycardic sinus rhythm QRS Axis: normal Intervals: normal ST/T Wave abnormalities: normal Narrative Interpretation: Tachycardic sinus rhythm.  No evidence of acute ischemia PROCEDURES: Critical Care performed: Yes, see critical care procedure note(s) .1-3 Lead EKG Interpretation  Performed by: Naaman Plummer, MD Authorized by: Naaman Plummer, MD     Interpretation: abnormal     ECG rate:  153  ECG rate assessment: tachycardic     Rhythm: ventricular tachycardia     Ectopy: none     Conduction: normal   CRITICAL CARE Performed by: Naaman Plummer  Total critical care time: 37 minutes  Critical care time was exclusive of separately billable procedures and treating other patients.  Critical care was necessary to treat or prevent imminent or life-threatening deterioration.  Critical care was time spent personally by me on the following activities: development of treatment plan with patient and/or surrogate as well as nursing, discussions with consultants, evaluation of patient's response to treatment, examination of patient, obtaining history from patient or surrogate, ordering and performing treatments and interventions, ordering  and review of laboratory studies, ordering and review of radiographic studies, pulse oximetry and re-evaluation of patient's condition.  MEDICATIONS ORDERED IN ED: Medications  ketamine 50 mg in normal saline 5 mL (10 mg/mL) syringe (42 mg Intravenous Given 08/03/22 2027)   IMPRESSION / MDM / ASSESSMENT AND PLAN / ED COURSE  I reviewed the triage vital signs and the nursing notes.                             The patient is on the cardiac monitor to evaluate for evidence of arrhythmia and/or significant heart rate changes. Patient's presentation is most consistent with acute presentation with potential threat to life or bodily function.  This patient presents to the ED for concern of chest pain, this involves an extensive number of treatment options, and is a complaint that carries with it a high risk of complications and morbidity.  The differential diagnosis includes ACS, ventricular tachycardia, atrial fibrillation with rvr Co morbidities that complicate the patient evaluation  Ventricular tachycardia, atrial fibrillation Additional history obtained:  Additional history obtained from EMS and caregiver  External records from outside source obtained and reviewed including cardiology note from 06/14/2022 for ventricular tachycardia Lab Tests:  I Ordered, and personally interpreted labs.  The pertinent results include: Troponin of 144, BNP 715 Cardiac Monitoring: / EKG:  The patient was maintained on a cardiac monitor.  I personally viewed and interpreted the cardiac monitored which showed an underlying rhythm of: Ventricular tachycardia Consultations Obtained:  I requested consultation with the Dr. Radford Pax from cardiology,  and discussed lab and imaging findings as well as pertinent plan - they recommend: Transfer to [facility] for continued cardiology care Problem List / ED Course / Critical interventions / Medication management  Ventricular tachycardia, chest pain  I ordered  medication including ketamine for cardioversion  Reevaluation of the patient after these medicines showed that the patient improved  I have reviewed the patients home medicines and have made adjustments as needed Dispo: Transfer to [place] IMPRESSION(S) / ED DIAGNOSES   Final diagnoses:  Ventricular tachycardia ([place])  Chest pain, unspecified type   Rx / DC Orders   ED Discharge Orders     None      Note:  This document was prepared using Dragon voice recognition software and may include unintentional dictation errors.   Naaman Plummer, MD 08/04/22 0000

## 2022-08-03 NOTE — ED Notes (Signed)
Repeat fax obtained from [employer]. Handed to MD

## 2022-08-04 ENCOUNTER — Encounter (HOSPITAL_COMMUNITY): Payer: Self-pay | Admitting: Family Medicine

## 2022-08-04 ENCOUNTER — Inpatient Hospital Stay (HOSPITAL_COMMUNITY)
Admission: EM | Admit: 2022-08-04 | Discharge: 2022-08-08 | DRG: 308 | Disposition: A | Discharge status: Nursing Home (Includes ICF) | Payer: Medicaid Other | Source: Other Acute Inpatient Hospital | Attending: Internal Medicine | Admitting: Internal Medicine

## 2022-08-04 ENCOUNTER — Inpatient Hospital Stay (HOSPITAL_COMMUNITY): Payer: Medicaid Other

## 2022-08-04 ENCOUNTER — Emergency Department (HOSPITAL_COMMUNITY): Payer: Medicaid Other

## 2022-08-04 DIAGNOSIS — I480 Paroxysmal atrial fibrillation: Secondary | ICD-10-CM | POA: Diagnosis present

## 2022-08-04 DIAGNOSIS — E1165 Type 2 diabetes mellitus with hyperglycemia: Secondary | ICD-10-CM | POA: Diagnosis present

## 2022-08-04 DIAGNOSIS — E039 Hypothyroidism, unspecified: Secondary | ICD-10-CM | POA: Diagnosis present

## 2022-08-04 DIAGNOSIS — Z951 Presence of aortocoronary bypass graft: Secondary | ICD-10-CM | POA: Diagnosis not present

## 2022-08-04 DIAGNOSIS — Z8673 Personal history of transient ischemic attack (TIA), and cerebral infarction without residual deficits: Secondary | ICD-10-CM

## 2022-08-04 DIAGNOSIS — N179 Acute kidney failure, unspecified: Secondary | ICD-10-CM

## 2022-08-04 DIAGNOSIS — F319 Bipolar disorder, unspecified: Secondary | ICD-10-CM | POA: Diagnosis present

## 2022-08-04 DIAGNOSIS — Z955 Presence of coronary angioplasty implant and graft: Secondary | ICD-10-CM

## 2022-08-04 DIAGNOSIS — I5023 Acute on chronic systolic (congestive) heart failure: Secondary | ICD-10-CM | POA: Diagnosis present

## 2022-08-04 DIAGNOSIS — E860 Dehydration: Secondary | ICD-10-CM | POA: Diagnosis present

## 2022-08-04 DIAGNOSIS — I255 Ischemic cardiomyopathy: Secondary | ICD-10-CM | POA: Diagnosis present

## 2022-08-04 DIAGNOSIS — Z79890 Hormone replacement therapy (postmenopausal): Secondary | ICD-10-CM

## 2022-08-04 DIAGNOSIS — I5020 Unspecified systolic (congestive) heart failure: Secondary | ICD-10-CM | POA: Diagnosis not present

## 2022-08-04 DIAGNOSIS — R569 Unspecified convulsions: Secondary | ICD-10-CM | POA: Diagnosis present

## 2022-08-04 DIAGNOSIS — I2510 Atherosclerotic heart disease of native coronary artery without angina pectoris: Secondary | ICD-10-CM | POA: Diagnosis present

## 2022-08-04 DIAGNOSIS — T82855A Stenosis of coronary artery stent, initial encounter: Secondary | ICD-10-CM | POA: Diagnosis present

## 2022-08-04 DIAGNOSIS — I252 Old myocardial infarction: Secondary | ICD-10-CM | POA: Diagnosis not present

## 2022-08-04 DIAGNOSIS — Z79899 Other long term (current) drug therapy: Secondary | ICD-10-CM

## 2022-08-04 DIAGNOSIS — Z993 Dependence on wheelchair: Secondary | ICD-10-CM | POA: Diagnosis not present

## 2022-08-04 DIAGNOSIS — Z95810 Presence of automatic (implantable) cardiac defibrillator: Secondary | ICD-10-CM

## 2022-08-04 DIAGNOSIS — I472 Ventricular tachycardia, unspecified: Secondary | ICD-10-CM | POA: Diagnosis present

## 2022-08-04 DIAGNOSIS — Z87898 Personal history of other specified conditions: Secondary | ICD-10-CM | POA: Diagnosis not present

## 2022-08-04 DIAGNOSIS — I714 Abdominal aortic aneurysm, without rupture, unspecified: Secondary | ICD-10-CM | POA: Diagnosis present

## 2022-08-04 DIAGNOSIS — R7989 Other specified abnormal findings of blood chemistry: Secondary | ICD-10-CM | POA: Diagnosis present

## 2022-08-04 DIAGNOSIS — Z881 Allergy status to other antibiotic agents status: Secondary | ICD-10-CM

## 2022-08-04 DIAGNOSIS — N1831 Chronic kidney disease, stage 3a: Secondary | ICD-10-CM | POA: Diagnosis present

## 2022-08-04 DIAGNOSIS — J449 Chronic obstructive pulmonary disease, unspecified: Secondary | ICD-10-CM | POA: Diagnosis present

## 2022-08-04 DIAGNOSIS — Z7901 Long term (current) use of anticoagulants: Secondary | ICD-10-CM

## 2022-08-04 DIAGNOSIS — F17210 Nicotine dependence, cigarettes, uncomplicated: Secondary | ICD-10-CM | POA: Diagnosis present

## 2022-08-04 DIAGNOSIS — I130 Hypertensive heart and chronic kidney disease with heart failure and stage 1 through stage 4 chronic kidney disease, or unspecified chronic kidney disease: Secondary | ICD-10-CM | POA: Diagnosis present

## 2022-08-04 DIAGNOSIS — I5022 Chronic systolic (congestive) heart failure: Secondary | ICD-10-CM | POA: Diagnosis not present

## 2022-08-04 DIAGNOSIS — Z91018 Allergy to other foods: Secondary | ICD-10-CM

## 2022-08-04 DIAGNOSIS — Z7984 Long term (current) use of oral hypoglycemic drugs: Secondary | ICD-10-CM

## 2022-08-04 DIAGNOSIS — Y831 Surgical operation with implant of artificial internal device as the cause of abnormal reaction of the patient, or of later complication, without mention of misadventure at the time of the procedure: Secondary | ICD-10-CM | POA: Diagnosis present

## 2022-08-04 DIAGNOSIS — Z880 Allergy status to penicillin: Secondary | ICD-10-CM

## 2022-08-04 DIAGNOSIS — Z882 Allergy status to sulfonamides status: Secondary | ICD-10-CM

## 2022-08-04 DIAGNOSIS — E785 Hyperlipidemia, unspecified: Secondary | ICD-10-CM | POA: Diagnosis present

## 2022-08-04 DIAGNOSIS — Z91041 Radiographic dye allergy status: Secondary | ICD-10-CM

## 2022-08-04 DIAGNOSIS — E1122 Type 2 diabetes mellitus with diabetic chronic kidney disease: Secondary | ICD-10-CM | POA: Diagnosis present

## 2022-08-04 LAB — URINALYSIS, COMPLETE (UACMP) WITH MICROSCOPIC
Bilirubin Urine: NEGATIVE
Glucose, UA: >=500 mg/dL — AB
Hgb urine dipstick: NEGATIVE
Ketones, ur: NEGATIVE mg/dL
Leukocytes,Ua: NEGATIVE
Nitrite: NEGATIVE
Protein, ur: 30 mg/dL — AB
Specific Gravity, Urine: 1.014 (ref 1.005–1.030)
pH: 5 (ref 5.0–8.0)

## 2022-08-04 LAB — CBC
HCT: 38.5 % — ABNORMAL LOW (ref 39.0–52.0)
Hemoglobin: 13.2 g/dL (ref 13.0–17.0)
MCH: 29.6 pg (ref 26.0–34.0)
MCHC: 34.3 g/dL (ref 30.0–36.0)
MCV: 86.3 fL (ref 80.0–100.0)
Platelets: 217 10*3/uL (ref 150–400)
RBC: 4.46 MIL/uL (ref 4.22–5.81)
RDW: 17.2 % — ABNORMAL HIGH (ref 11.5–15.5)
WBC: 9.2 10*3/uL (ref 4.0–10.5)
nRBC: 0 % (ref 0.0–0.2)

## 2022-08-04 LAB — ECHOCARDIOGRAM COMPLETE
Area-P 1/2: 2.52 cm2
MV M vel: 4.02 m/s
MV Peak grad: 64.6 mmHg
MV VTI: 0.97 cm2
S' Lateral: 5.5 cm

## 2022-08-04 LAB — SODIUM, URINE, RANDOM: Sodium, Ur: 16 mmol/L

## 2022-08-04 LAB — BASIC METABOLIC PANEL
Anion gap: 12 (ref 5–15)
BUN: 32 mg/dL — ABNORMAL HIGH (ref 8–23)
CO2: 22 mmol/L (ref 22–32)
Calcium: 8.9 mg/dL (ref 8.9–10.3)
Chloride: 97 mmol/L — ABNORMAL LOW (ref 98–111)
Creatinine, Ser: 2.11 mg/dL — ABNORMAL HIGH (ref 0.61–1.24)
GFR, Estimated: 35 mL/min — ABNORMAL LOW (ref >60)
Glucose, Bld: 104 mg/dL — ABNORMAL HIGH (ref 70–99)
Potassium: 4.7 mmol/L (ref 3.5–5.1)
Sodium: 131 mmol/L — ABNORMAL LOW (ref 135–145)

## 2022-08-04 LAB — URIC ACID: Uric Acid, Serum: 11.1 mg/dL — ABNORMAL HIGH (ref 3.7–8.6)

## 2022-08-04 LAB — CREATININE, URINE, RANDOM: Creatinine, Urine: 129 mg/dL

## 2022-08-04 LAB — CBG MONITORING, ED
Glucose-Capillary: 111 mg/dL — ABNORMAL HIGH (ref 70–99)
Glucose-Capillary: 114 mg/dL — ABNORMAL HIGH (ref 70–99)

## 2022-08-04 LAB — PROTIME-INR
INR: 1.2 (ref 0.8–1.2)
Prothrombin Time: 14.7 seconds (ref 11.4–15.2)

## 2022-08-04 LAB — TROPONIN I (HIGH SENSITIVITY)
Troponin I (High Sensitivity): 355 ng/L (ref <18)
Troponin I (High Sensitivity): 393 ng/L (ref <18)

## 2022-08-04 LAB — GLUCOSE, CAPILLARY: Glucose-Capillary: 163 mg/dL — ABNORMAL HIGH (ref 70–99)

## 2022-08-04 LAB — MAGNESIUM: Magnesium: 2.4 mg/dL (ref 1.7–2.4)

## 2022-08-04 LAB — HIV ANTIBODY (ROUTINE TESTING W REFLEX): HIV Screen 4th Generation wRfx: NONREACTIVE

## 2022-08-04 LAB — BRAIN NATRIURETIC PEPTIDE: B Natriuretic Peptide: 681.9 pg/mL — ABNORMAL HIGH (ref 0.0–100.0)

## 2022-08-04 LAB — OSMOLALITY: Osmolality: 290 mOsm/kg (ref 275–295)

## 2022-08-04 LAB — OSMOLALITY, URINE: Osmolality, Ur: 480 mOsm/kg (ref 300–900)

## 2022-08-04 MED ORDER — AMIODARONE HCL IN DEXTROSE 360-4.14 MG/200ML-% IV SOLN
30.0000 mg/h | INTRAVENOUS | Status: AC
  Administered 2022-08-04 – 2022-08-05 (×3): 30 mg/h via INTRAVENOUS
  Filled 2022-08-04 (×4): qty 200

## 2022-08-04 MED ORDER — METOPROLOL SUCCINATE ER 25 MG PO TB24
12.5000 mg | ORAL_TABLET | Freq: Every day | ORAL | Status: DC
  Administered 2022-08-04: 12.5 mg via ORAL
  Filled 2022-08-04: qty 1

## 2022-08-04 MED ORDER — OXYCODONE HCL 5 MG PO TABS: 5.0000 mg | ORAL_TABLET | Freq: Four times a day (QID) | ORAL | Status: DC | PRN

## 2022-08-04 MED ORDER — POTASSIUM CHLORIDE CRYS ER 20 MEQ PO TBCR: 20.0000 meq | EXTENDED_RELEASE_TABLET | Freq: Every day | ORAL | Status: DC

## 2022-08-04 MED ORDER — NITROGLYCERIN 0.4 MG SL SUBL: 0.4000 mg | SUBLINGUAL_TABLET | SUBLINGUAL | Status: DC | PRN

## 2022-08-04 MED ORDER — INSULIN ASPART 100 UNIT/ML IJ SOLN: 0.0000 [IU] | Freq: Every day | INTRAMUSCULAR | Status: DC

## 2022-08-04 MED ORDER — ESCITALOPRAM OXALATE 10 MG PO TABS
10.0000 mg | ORAL_TABLET | Freq: Every day | ORAL | Status: DC
  Administered 2022-08-04 – 2022-08-07 (×4): 10 mg via ORAL
  Filled 2022-08-04 (×4): qty 1

## 2022-08-04 MED ORDER — HYDROXYZINE HCL 10 MG PO TABS
10.0000 mg | ORAL_TABLET | Freq: Two times a day (BID) | ORAL | Status: DC
  Administered 2022-08-04 – 2022-08-08 (×9): 10 mg via ORAL
  Filled 2022-08-04 (×9): qty 1

## 2022-08-04 MED ORDER — LEVOTHYROXINE SODIUM 50 MCG PO TABS
50.0000 ug | ORAL_TABLET | Freq: Every day | ORAL | Status: DC
  Administered 2022-08-04 – 2022-08-08 (×5): 50 ug via ORAL
  Filled 2022-08-04 (×2): qty 1
  Filled 2022-08-04: qty 2
  Filled 2022-08-04 (×2): qty 1

## 2022-08-04 MED ORDER — LOSARTAN POTASSIUM 25 MG PO TABS: 12.5000 mg | ORAL_TABLET | Freq: Every day | ORAL | Status: DC

## 2022-08-04 MED ORDER — ALBUTEROL SULFATE (2.5 MG/3ML) 0.083% IN NEBU
2.5000 mg | INHALATION_SOLUTION | Freq: Four times a day (QID) | RESPIRATORY_TRACT | Status: DC | PRN
  Administered 2022-08-05: 2.5 mg via RESPIRATORY_TRACT
  Filled 2022-08-04: qty 3

## 2022-08-04 MED ORDER — SODIUM CHLORIDE 0.9% FLUSH
3.0000 mL | Freq: Two times a day (BID) | INTRAVENOUS | Status: DC
  Administered 2022-08-04 – 2022-08-08 (×7): 3 mL via INTRAVENOUS

## 2022-08-04 MED ORDER — DIVALPROEX SODIUM ER 500 MG PO TB24
500.0000 mg | ORAL_TABLET | Freq: Two times a day (BID) | ORAL | Status: DC
  Administered 2022-08-04 – 2022-08-08 (×9): 500 mg via ORAL
  Filled 2022-08-04 (×10): qty 1

## 2022-08-04 MED ORDER — MEXILETINE HCL 250 MG PO CAPS
250.0000 mg | ORAL_CAPSULE | Freq: Two times a day (BID) | ORAL | Status: DC
  Administered 2022-08-04 – 2022-08-08 (×9): 250 mg via ORAL
  Filled 2022-08-04 (×10): qty 1

## 2022-08-04 MED ORDER — AMIODARONE HCL IN DEXTROSE 360-4.14 MG/200ML-% IV SOLN
60.0000 mg/h | INTRAVENOUS | Status: AC
  Administered 2022-08-04: 60 mg/h via INTRAVENOUS
  Filled 2022-08-04: qty 200

## 2022-08-04 MED ORDER — ACETAMINOPHEN 325 MG PO TABS: 650.0000 mg | ORAL_TABLET | Freq: Four times a day (QID) | ORAL | Status: DC | PRN

## 2022-08-04 MED ORDER — QUETIAPINE FUMARATE 100 MG PO TABS
200.0000 mg | ORAL_TABLET | Freq: Every day | ORAL | Status: DC
  Administered 2022-08-04 – 2022-08-07 (×4): 200 mg via ORAL
  Filled 2022-08-04 (×4): qty 2

## 2022-08-04 MED ORDER — INSULIN ASPART 100 UNIT/ML IJ SOLN
0.0000 [IU] | Freq: Three times a day (TID) | INTRAMUSCULAR | Status: DC
  Administered 2022-08-07: 2 [IU] via SUBCUTANEOUS

## 2022-08-04 MED ORDER — LACTATED RINGERS IV SOLN: INTRAVENOUS | Status: AC

## 2022-08-04 MED ORDER — UMECLIDINIUM BROMIDE 62.5 MCG/ACT IN AEPB
1.0000 | INHALATION_SPRAY | Freq: Every day | RESPIRATORY_TRACT | Status: DC
  Administered 2022-08-04 – 2022-08-08 (×4): 1 via RESPIRATORY_TRACT
  Filled 2022-08-04: qty 7

## 2022-08-04 MED ORDER — PANTOPRAZOLE SODIUM 40 MG PO TBEC
40.0000 mg | DELAYED_RELEASE_TABLET | Freq: Every day | ORAL | Status: DC
  Administered 2022-08-04 – 2022-08-08 (×5): 40 mg via ORAL
  Filled 2022-08-04 (×5): qty 1

## 2022-08-04 MED ORDER — FUROSEMIDE 20 MG PO TABS: 40.0000 mg | ORAL_TABLET | Freq: Two times a day (BID) | ORAL | Status: DC

## 2022-08-04 MED ORDER — ISOSORBIDE MONONITRATE ER 30 MG PO TB24
15.0000 mg | ORAL_TABLET | Freq: Every day | ORAL | Status: DC
  Administered 2022-08-04: 15 mg via ORAL
  Filled 2022-08-04: qty 1

## 2022-08-04 MED ORDER — APIXABAN 5 MG PO TABS
5.0000 mg | ORAL_TABLET | Freq: Two times a day (BID) | ORAL | Status: DC
  Administered 2022-08-04 – 2022-08-08 (×9): 5 mg via ORAL
  Filled 2022-08-04 (×9): qty 1

## 2022-08-04 MED ORDER — ACETAMINOPHEN 650 MG RE SUPP: 650.0000 mg | Freq: Four times a day (QID) | RECTAL | Status: DC | PRN

## 2022-08-04 MED ORDER — SPIRONOLACTONE 12.5 MG HALF TABLET: 25.0000 mg | ORAL_TABLET | Freq: Every day | ORAL | Status: DC

## 2022-08-04 MED ORDER — LEVETIRACETAM 250 MG PO TABS
250.0000 mg | ORAL_TABLET | Freq: Two times a day (BID) | ORAL | Status: DC
  Administered 2022-08-04 – 2022-08-08 (×9): 250 mg via ORAL
  Filled 2022-08-04 (×10): qty 1

## 2022-08-04 MED ORDER — ROSUVASTATIN CALCIUM 5 MG PO TABS
10.0000 mg | ORAL_TABLET | Freq: Every day | ORAL | Status: DC
  Administered 2022-08-04 – 2022-08-08 (×5): 10 mg via ORAL
  Filled 2022-08-04 (×5): qty 2

## 2022-08-04 NOTE — Plan of Care (Signed)
  Problem: Activity: Goal: Capacity to carry out activities will improve Outcome: Progressing   Problem: Cardiac: Goal: Ability to achieve and maintain adequate cardiopulmonary perfusion will improve Outcome: Progressing   

## 2022-08-04 NOTE — Progress Notes (Signed)
  Echocardiogram 2D Echocardiogram has been performed.  Carl [name] 08/04/2022, 4:02 PM

## 2022-08-04 NOTE — ED Notes (Signed)
RN setup pt breakfast tray

## 2022-08-04 NOTE — ED Notes (Signed)
Provided pt bag lunch and orange juice

## 2022-08-04 NOTE — ED Provider Notes (Signed)
[place] Provider Note   CSN: [id number] Arrival date & time: 08/04/22  0045     History  Chief Complaint  Patient presents with   Chest Pain    Carl [name] is a 62 y.o. male who presents in transfer from [facility] for cardiology consult.  Presenting from his facility to [place] with reported chest pain 9 out of 10 that started when patient was outside smoking.  Was found to be in V. tach with EMS, given amnio with no conversion, underwent synchronized cardioversion at [facility] with success, per Chart review Dr. Cheri Fowler at [facility] at the request of cardiologist Dr. Radford Pax with whom he spoke on the phone.  At this time patient reports resolution of his chest pain.  At baseline patient seems to have some sort of cognitive disability, unable to provide much insight into his history at this time.  I personally reviewed his previous history of type 2 diabetes, CAD, chronic heart failure, hyperlipidemia, CVA, COPD, AAA, hypertension he is not anticoagulated does have pacemaker in place.    Echo from August 2023 with ejection fraction of 25 to 30%. Afib anticoagulated on eliquis,   HPI     Home Medications Prior to Admission medications   Medication Sig Start Date End Date Taking? Authorizing Provider  acetaminophen (TYLENOL) 500 MG tablet Take 500 mg by mouth every 6 (six) hours as needed for mild pain.    [provider]  albuterol (PROVENTIL) (2.5 MG/3ML) 0.083% nebulizer solution Take 3 mLs (2.5 mg total) by nebulization every 6 (six) hours as needed for wheezing or shortness of breath. 05/25/22   Loletha Grayer, MD  albuterol (VENTOLIN HFA) 108 (90 Base) MCG/ACT inhaler Inhale 2 puffs into the lungs every 6 (six) hours as needed for wheezing. 05/25/22   Loletha Grayer, MD  amiodarone (PACERONE) 400 MG tablet Take 1 tablet (400 mg total) by mouth daily. 06/14/22   Furth, Cadence H, PA-C  apixaban (ELIQUIS) 5 MG  TABS tablet TAKE 1 TABLET BY MOUTH TWICE A DAY Patient taking differently: Take 5 mg by mouth 2 (two) times daily. 01/18/21   Deboraha Sprang, MD  dapagliflozin propanediol (FARXIGA) 10 MG TABS tablet Take 1 tablet (10 mg total) by mouth daily. 05/01/22   Fritzi Mandes, MD  divalproex (DEPAKOTE ER) 500 MG 24 hr tablet Take 1 tablet (500 mg total) by mouth 2 (two) times daily. 05/24/19   Clapacs, Madie Reno, MD  escitalopram (LEXAPRO) 10 MG tablet Take 1 tablet (10 mg total) by mouth at bedtime. 05/24/19   Clapacs, Madie Reno, MD  ezetimibe (ZETIA) 10 MG tablet Take 1 tablet (10 mg total) by mouth daily. 04/01/22   Annita Brod, MD  furosemide (LASIX) 20 MG tablet Take 3 tablets (60 mg total) by mouth 2 (two) times daily. 05/25/22   Loletha Grayer, MD  HYDROcodone-acetaminophen (NORCO/VICODIN) 5-325 MG tablet Take 1 tablet by mouth every 6 (six) hours as needed for severe pain. 05/25/22   Loletha Grayer, MD  Hydrocortisone Acetate 1 % CREA Apply TID 05/15/22   [provider]  hydrOXYzine (ATARAX) 10 MG tablet Take 10 mg by mouth 2 (two) times daily. 12/24/21   [provider]  isosorbide mononitrate (IMDUR) 30 MG 24 hr tablet Take 0.5 tablets (15 mg total) by mouth daily. 05/26/22   Loletha Grayer, MD  levETIRAcetam (KEPPRA) 250 MG tablet Take 1 tablet (250 mg total) by mouth 2 (two) times daily. 03/24/20  Minna Antis, MD  levothyroxine (SYNTHROID) 25 MCG tablet Take 1 tablet (25 mcg total) by mouth daily. Patient taking differently: Take 50 mcg by mouth daily. 05/24/19   Clapacs, Jackquline Denmark, MD  losartan (COZAAR) 25 MG tablet Take 0.5 tablets (12.5 mg total) by mouth daily. 05/01/22   Enedina Finner, MD  metoprolol succinate (TOPROL-XL) 25 MG 24 hr tablet Take 0.5 tablets (12.5 mg total) by mouth at bedtime. 04/30/22   Enedina Finner, MD  Multiple Vitamin (MULTIVITAMIN WITH MINERALS) TABS tablet Take 1 tablet by mouth daily. 05/01/22   Enedina Finner, MD  nitroGLYCERIN (NITROSTAT) 0.4 MG  SL tablet Place 1 tablet (0.4 mg total) under the tongue every 5 (five) minutes as needed for chest pain. 05/25/22   Alford Highland, MD  omeprazole (PRILOSEC) 20 MG capsule Take 20 mg by mouth daily.    [provider]  potassium chloride SA (KLOR-CON M) 20 MEQ tablet Take 2 tablets (40 mEq total) by mouth daily. 06/14/22   Furth, Cadence H, PA-C  QUEtiapine (SEROQUEL) 200 MG tablet Take 200 mg by mouth at bedtime.    [provider]  rosuvastatin (CRESTOR) 10 MG tablet Take 1 tablet (10 mg total) by mouth daily. 05/16/22   Delma Freeze, FNP  spironolactone (ALDACTONE) 25 MG tablet Take 1 tablet (25 mg total) by mouth daily. 05/26/22   Alford Highland, MD  tiotropium (SPIRIVA) 18 MCG inhalation capsule Place 18 mcg into inhaler and inhale daily.    [provider]  traMADol (ULTRAM) 50 MG tablet Take 50 mg by mouth every 12 (twelve) hours as needed for moderate pain.    [provider]      Allergies    Iodinated contrast media, Penicillins, Strawberry extract, Cefepime, Erythromycin, and Sulfa antibiotics    Review of Systems   Review of Systems  Constitutional: Negative.   HENT: Negative.    Respiratory: Negative.    Cardiovascular:  Positive for chest pain.  Gastrointestinal: Negative.     Physical Exam Updated Vital Signs BP 98/66   Pulse 72   Temp 98 F (36.7 C) (Oral)   Resp (!) 23   SpO2 94%  Physical Exam Vitals and nursing note reviewed.  Constitutional:      Appearance: He is not ill-appearing or toxic-appearing.  HENT:     Head: Normocephalic and atraumatic.     Mouth/Throat:     Mouth: Mucous membranes are moist.     Pharynx: No oropharyngeal exudate or posterior oropharyngeal erythema.  Eyes:     General:        Right eye: No discharge.        Left eye: No discharge.     Conjunctiva/sclera: Conjunctivae normal.  Cardiovascular:     Rate and Rhythm: Normal rate and regular rhythm.     Pulses: Normal pulses.     Heart  sounds: Normal heart sounds. No murmur heard. Pulmonary:     Effort: Pulmonary effort is normal. No respiratory distress.     Breath sounds: Normal breath sounds. No wheezing or rales.  Abdominal:     General: Bowel sounds are normal. There is no distension.     Tenderness: There is no abdominal tenderness.  Musculoskeletal:        General: No deformity.     Cervical back: Neck supple.  Skin:    General: Skin is warm and dry.     Capillary Refill: Capillary refill takes less than 2 seconds.  Neurological:  General: No focal deficit present.     Mental Status: He is alert. Mental status is at baseline.  Psychiatric:        Mood and Affect: Mood normal.     ED Results / Procedures / Treatments   Labs (all labs ordered are listed, but only abnormal results are displayed) Labs Reviewed  TROPONIN I (HIGH SENSITIVITY) - Abnormal; Notable for the following components:      Result Value   Troponin I (High Sensitivity) 355 (*)    All other components within normal limits  PROTIME-INR  TROPONIN I (HIGH SENSITIVITY)    EKG None  Radiology DG Chest Portable 1 View  Result Date: 08/04/2022 CLINICAL DATA:  Chest pain and tachycardia EXAM: PORTABLE CHEST 1 VIEW COMPARISON:  05/22/2022 FINDINGS: Cardiac shadow is enlarged but stable. Postsurgical changes are seen. Defibrillator is again noted. Lungs are well aerated without focal infiltrate or sizable effusion. No acute bony abnormality is seen. Prior fixation of the left clavicle is again seen. IMPRESSION: No acute abnormality noted. Electronically Signed   By: Inez Catalina M.D.   On: 08/04/2022 01:46    Procedures Procedures    Medications Ordered in ED Medications  amiodarone (NEXTERONE PREMIX) 360-4.14 MG/200ML-% (1.8 mg/mL) IV infusion (60 mg/hr Intravenous New Bag/Given 08/04/22 0148)  amiodarone (NEXTERONE PREMIX) 360-4.14 MG/200ML-% (1.8 mg/mL) IV infusion (has no administration in time range)    ED Course/ Medical  Decision Making/ A&P Clinical Course as of 08/04/22 0349  Nancy Fetter Aug 04, 2022  0139 Consult to Dr. Humphrey Rolls, cardiologist who recommends initiating patient on amiodarone infusion and admitting to hospital medicine due to extensive medical history.  Recommend holding losartan and spironolactone in context of AKI.  He will see the patient in consultation.  Appreciate his collaboration in the care of this patient. [RS]  0200 Consult to Dr. Myna Hidalgo, hospitalist was agreeable admitting the patient to his service.  Appreciate collaboration of care this patient. [RS]    Clinical Course User Index [RS] Mostyn Varnell, Gypsy Balsam, PA-C                             Medical Decision Making 63 year old male presents in transfer after cardioversion for Vtach at [facility].   Paced rhythm in the 60s at time of arrival, CP free.  Cardiopulmonary exam is unremarkable, abdominal exam is benign.  Patient at cognitive baseline.  Amount and/or Complexity of Data Reviewed Labs: ordered.    Details: Laboratory studies from [facility] revealed CBC without leukocytosis or anemia, CMP with hyponatremia of 129, AKI with creatinine of 2.2 increased from patient's baseline of 1.3.  BNP of 715, troponins uptrending from 88-144.  Troponin uptrending to 355, however likely demand related in context of episode of V. tach.  Will trend per cardiology. Radiology: ordered.  Risk Prescription drug management. Decision regarding hospitalization.   Patient will require admission to the hospital for further Cardiologic evaluation given context of V. tach.  Additionally has AKI today which will warrant medical admission.  Consult cardiology Dr. Humphrey Rolls as above, patient admitted to hospitalist Dr. Myna Hidalgo.  Patient remains hemodynamically stable, resting comfortably in his bed at this time.  B[place]ia voiced understanding of his medical evaluation and treatment plan. Each of their questions answered to their expressed satisfaction.  Return precautions were given.   Patient is well-appearing, stable, and was discharged in good condition.  This chart was dictated using voice recognition software, Dragon. Despite the best  efforts of this provider to proofread and correct errors, errors may still occur which can change documentation meaning.   Final Clinical Impression(s) / ED Diagnoses Final diagnoses:  V tach ([place])  AKI (acute kidney injury) [facility])    Rx / [facility] Orders ED Discharge Orders     None         Aura Dials 08/04/22 [place], [place], MD 08/04/22 [phone]

## 2022-08-04 NOTE — ED Notes (Signed)
RN assisted pt off bedpan

## 2022-08-04 NOTE — Progress Notes (Addendum)
PROGRESS NOTE                                                                                                                                                                                                             Patient Demographics:    Carl [name], is a 62 y.o. male, DOB - 12-01-1960, QPY:[id number]  Outpatient Primary MD for the patient is [facility]    LOS - 0  Admit date - 08/04/2022    Chief Complaint  Patient presents with   Chest Pain       Brief Narrative (HPI from H&P)   62 y.o. male with medical history significant for hypertension, type 2 diabetes mellitus, bipolar disorder, CAD status post CABG, history of seizure, history of stroke, chronic systolic CHF EF 12% has biventricular pacemaker with AICD and atrial fibrillation on Eliquis who presents emergency department for evaluation of ventricular tachycardia.  He lives at an SNF where he went out to smoke a cigarette on the day of admission and developed some chest discomfort when EMS arrived he was found to be in V. tach and brought to the ER.   Subjective:    Carl [name] today has, No headache, No chest pain, No abdominal pain - No Nausea, No new weakness tingling or numbness, no SOB.   Assessment  & Plan :    1. Ventricular tachycardia in a patient with chronic ischemic cardiomyopathy with chronic systolic heart failure EF around 30% has biventricular pacemaker/AICD device in place.  He is chronically on oral amiodarone, seen by cardiology in the ER and placed on amiodarone drip, currently blood pressure low and in AKI hence cannot do beta-blocker, ACE/ARB or Entresto.  Clinically appears dehydrated.  Defer management of V. tach and chest pain to cardiology team.  Currently chest pain-free and symptom-free, stable electrolytes.  Repeat echocardiogram pending.  2. AKI superimposed on CKD IIIa  - - SCr is 2.28 on admission, slight  creatinine is under 1.5 appears dehydrated, urine electrolytes ordered, hold diuretics, hold blood pressure medications gently hydrate for 500 cc total and monitor renal function.  3. Chronic HFrEF 30% on recent echocardiogram.  At this biventricular pacemaker.  Plan as in #1 above. - Appears compensated  - Hold diuretics and ARB initially in setting of AKI, monitor weight and I/Os    4. PAF  with [place] vas 2 score of greater than 3. - Continue Eliqius and Toprol as tolerated by blood pressure   5. Elevated troponin; CAD - No anginal complaints, troponin trend is flat and in non-ACS pattern.  Continue statin for secondary prevention, beta-blocker if tolerated by blood pressure, on Eliquis, cardiology on board.  Chest pain-free.    6. Bipolar disorder  - Continue Lexapro, Atarax, and Seroquel     7. Hx of seizures  - Continue Depakote and Keppra     8.  Hypothyroidism  - Continue Synthroid   9. Type II DM  - A1c was 6.6% in September 2023  - Check CBGs and use low-intensity SSI for now   CBG (last 3)  Recent Labs    08/04/22 0825  GLUCAP 111*             Condition - Extremely Guarded  Family Communication  :  None  Code Status :  Full  Consults  :  Cards  PUD Prophylaxis :    Procedures  :     TTE      Disposition Plan  :    Status is: Inpatient   DVT Prophylaxis  :     apixaban (ELIQUIS) tablet 5 mg     Lab Results  Component Value Date   PLT 217 08/04/2022    Diet :  Diet Order             Diet regular Room service appropriate? Yes; Fluid consistency: Thin  Diet effective now                    Inpatient Medications  Scheduled Meds:  apixaban  5 mg Oral BID   divalproex  500 mg Oral BID   escitalopram  10 mg Oral QHS   hydrOXYzine  10 mg Oral BID   insulin aspart  0-5 Units Subcutaneous QHS   insulin aspart  0-6 Units Subcutaneous TID WC   isosorbide mononitrate  15 mg Oral Daily   levETIRAcetam  250 mg Oral BID    levothyroxine  50 mcg Oral Daily   metoprolol succinate  12.5 mg Oral QHS   pantoprazole  40 mg Oral Daily   QUEtiapine  200 mg Oral QHS   rosuvastatin  10 mg Oral Daily   sodium chloride flush  3 mL Intravenous Q12H   umeclidinium bromide  1 puff Inhalation Daily   Continuous Infusions:  amiodarone 30 mg/hr (08/04/22 0756)   lactated ringers     PRN Meds:.acetaminophen **OR** acetaminophen, albuterol, nitroGLYCERIN, oxyCODONE  Antibiotics  :    Anti-infectives (From admission, onward)    None         Objective:   Vitals:   08/04/22 0630 08/04/22 0732 08/04/22 0830 08/04/22 0935  BP: 108/72  113/69 107/66  Pulse: 79  65 65  Resp: 20  20 16   Temp:  98 F (36.7 C)    TempSrc:  Oral    SpO2: 97%  99% 100%    Wt Readings from Last 3 Encounters:  08/03/22 84.8 kg  07/31/22 84.9 kg  06/14/22 83 kg     Intake/Output Summary (Last 24 hours) at 08/04/2022 0944 Last data filed at 08/04/2022 08/06/2022 Gross per 24 hour  Intake --  Output 375 ml  Net -375 ml     Physical Exam  Awake Alert, No new F.N deficits, Normal affect [place].AT,PERRAL Supple Neck, No JVD,   Symmetrical Chest wall movement, Good air movement bilaterally,  CTAB RRR,No Gallops,Rubs or new Murmurs,  +ve B.Sounds, Abd Soft, No tenderness,   No Cyanosis, Clubbing or edema      Data Review:    Recent Labs  Lab 08/03/22 1935 08/04/22 0546  WBC 8.2 9.2  HGB 14.2 13.2  HCT 42.6 38.5*  PLT 223 217  MCV 87.3 86.3  MCH 29.1 29.6  MCHC 33.3 34.3  RDW 17.1* 17.2*  LYMPHSABS 2.3  --   MONOABS 0.4  --   EOSABS 0.4  --   BASOSABS 0.1  --     Recent Labs  Lab 08/03/22 1935 08/04/22 0153 08/04/22 0424 08/04/22 0546  NA 129*  --   --  131*  K 4.0  --   --  4.7  CL 93*  --   --  97*  CO2 23  --   --  22  ANIONGAP 13  --   --  12  GLUCOSE 193*  --   --  104*  BUN 34*  --   --  32*  CREATININE 2.28*  --   --  2.11*  AST 35  --   --   --   ALT 21  --   --   --   ALKPHOS 60  --   --   --    BILITOT 0.7  --   --   --   ALBUMIN 3.6  --   --   --   INR  --  1.2  --   --   BNP 715.9*  --  681.9*  --   MG 2.4  --   --  2.4  CALCIUM 9.1  --   --  8.9      Recent Labs  Lab 08/03/22 1935 08/04/22 0153 08/04/22 0424 08/04/22 0546  INR  --  1.2  --   --   BNP 715.9*  --  681.9*  --   MG 2.4  --   --  2.4  CALCIUM 9.1  --   --  8.9    Recent Labs  Lab 08/03/22 1935 08/04/22 0546  WBC 8.2 9.2  PLT 223 217  CREATININE 2.28* 2.11*    ------------------------------------------------------------------------------------------------------------------ No results for input(s): "CHOL", "HDL", "LDLCALC", "TRIG", "CHOLHDL", "LDLDIRECT" in the last 72 hours.  Lab Results  Component Value Date   HGBA1C 6.6 (H) 03/25/2022    No results for input(s): "TSH", "T4TOTAL", "T3FREE", "THYROIDAB" in the last 72 hours.  Invalid input(s): "FREET3" ------------------------------------------------------------------------------------------------------------------ Cardiac Enzymes No results for input(s): "CKMB", "TROPONINI", "MYOGLOBIN" in the last 168 hours.  Invalid input(s): "CK"  Micro Results No results found for this or any previous visit (from the past 240 hour(s)).  Radiology Reports US RENAL  Result Date: 08/04/2022 CLINICAL DATA:  Acute renal failure EXAM: RENAL / URINARY TRACT ULTRASOUND COMPLETE COMPARISON:  CT scan 03/14/2022 FINDINGS: Right Kidney: Renal measurements: 9.7 x 5.2 x 5.5 cm = volume: 145 mL. Echogenicity within normal limits. No mass or hydronephrosis visualized. Left Kidney: Renal measurements: 11.6 x 5.4 x 4.5 cm = volume: 148 mL. 15 mm cyst identified towards the upper pole, similar to prior CT. 13 mm interpolar subcapsular cyst evident, also present on prior CT although more subtle. No hydronephrosis. Bladder: Appears normal for degree of bladder distention. Other: Liver parenchyma appears echogenic suggesting fatty deposition. IMPRESSION: No  hydronephrosis. Sonographic features suggesting hepatic steatosis. Electronically Signed   By: Kennith Center M.D.   On: 08/04/2022 06:49   DG Chest Portable 1  View  Result Date: 08/04/2022 CLINICAL DATA:  Chest pain and tachycardia EXAM: PORTABLE CHEST 1 VIEW COMPARISON:  05/22/2022 FINDINGS: Cardiac shadow is enlarged but stable. Postsurgical changes are seen. Defibrillator is again noted. Lungs are well aerated without focal infiltrate or sizable effusion. No acute bony abnormality is seen. Prior fixation of the left clavicle is again seen. IMPRESSION: No acute abnormality noted. Electronically Signed   By: Inez Catalina M.D.   On: 08/04/2022 01:46   CUP PACEART REMOTE DEVICE CHECK  Result Date: 08/02/2022 Scheduled remote reviewed. Normal device function.  Optivol crossed threshold 1/17 and is ongoing Next remote 91 days. [place]     Signature  -   Lala Lund M.D on 08/04/2022 at 9:44 AM   -  To page go to [email or web address]

## 2022-08-04 NOTE — Consult Note (Signed)
Cardiology Consultation   Patient ID: Aadon Gorelik MRN: [id number]; DOB: [birth year]  Admit date: 08/04/2022 Date of Consult: 08/04/2022  PCP:  Orvis Brill, [place] [facility] Cardiologist:  Kathlyn Sacramento, MD  Electrophysiologist:  Virl Axe, MD       Patient Profile:   Arbie Cid is a 62 y.o. male with a hx of atrial fibrillation on Eliquis, bipolar, CHF, CKD, COPD, depression, hypertension, CAD status post MI and CABG, seizures and CVA  who is being seen 08/04/2022 for the evaluation of VT at the request of Dr. Myna Hidalgo  History of Present Illness:   Mr. Hoselton is a 61 y.o. male with a hx of atrial fibrillation on Eliquis, bipolar, CHF, CKD, COPD, depression, hypertension, CAD status post MI and CABG, seizures and CVA  who is being seen 08/04/2022 for the evaluation of VT at the request of Dr. Myna Hidalgo. Patient was recently discharged on 05/25/22 for ICD discharge. He started having CP today- called EMS. He had left sided chest pressure. He was found to be in [facility] concerning for ventricular tachycardia. He was Sync Cardioverted at [place] with success, now in paced rhythm. He was then transferred here for further care. He has [employer] PPM. Currently, he is complaint free. He denies any CP. He is paced on EKG. Cardiology was consulted for episode of Vtach. His Cr is 2.2 currently; troponin are mildly elevated. Echo 8/23 EF 25-30% with mild concentric LVH, normal RV function, severe LAE, and mild MR/AI. Recently,  Cath performed and revealed 2 of 2 patent grafts with moderate but patent RCA grafts and distal RCA bifurcation disease up to 85% with normal iFR.  Medical therapy was recommended and he was subsequently discharged. He is wheelchair bound at baseline.    Past Medical History:  Diagnosis Date   Acute pulmonary edema ([place]) 2017   Acute respiratory failure with hypoxia ([place]) 2017   Bipolar 1 disorder (HCC)    CAD (coronary artery disease)    a. Prior RCA  stenting; b. 2017 s/p CABG x 2 (LIMA->D1, VG->OM2); b. 04/2022 [facility]/Cath: LM mild dzs, LAD 38m, D1 100, LCX mod diff dzs, OM2 100, OM3 50, RCA 40/20p ISR, 85d (iFR 0.99), 50d, RPAV 60, LIMA->D1 small, patent, VG->OM2 large, mild dzs-->Med Rx.   Chronic HFrEF (heart failure with reduced ejection fraction) ([place])    a. 02/2022 Echo: EF 25-30%, mild conc LVH, nl RV fxn, sev dil LA, mildly dil RA, mild MR/AI.   CKD (chronic kidney disease), stage III (HCC)    COPD (chronic obstructive pulmonary disease) ([place])    Depression    Hypertension    Ischemic cardiomyopathy    a. 04/2020 s/p MDT MRI compatible CRT-D (Ser # [id number]); b. 02/2022 Echo: EF 25-30%.   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   Seizures ([place])    childhood   Stroke [facility])    Tobacco abuse     Past Surgical History:  Procedure Laterality Date   BIV UPGRADE N/A 05/05/2020   Procedure: BIV ICD UPGRADE;  Surgeon: Deboraha Sprang, MD;  Location: [place] [facility];  Service: Cardiovascular;  Laterality: N/A;   CORONARY ARTERY BYPASS GRAFT     INTRAVASCULAR PRESSURE WIRE/FFR STUDY N/A 04/25/2022   Procedure: INTRAVASCULAR PRESSURE WIRE/FFR STUDY;  Surgeon: Nelva Bush, MD;  Location: [place] [facility];  Service: Cardiovascular;  Laterality: N/A;   RIGHT/LEFT  HEART CATH AND CORONARY/GRAFT ANGIOGRAPHY N/A 04/25/2022   Procedure: RIGHT/LEFT HEART CATH AND CORONARY/GRAFT ANGIOGRAPHY;  Surgeon: Nelva Bush, MD;  Location: [place] [facility];  Service: Cardiovascular;  Laterality: N/A;       Inpatient Medications: Scheduled Meds:  Continuous Infusions:  amiodarone 60 mg/hr (08/04/22 0148)   amiodarone     PRN Meds:   Allergies:    Allergies  Allergen Reactions   Iodinated Contrast Media Shortness Of Breath   Penicillins Anaphylaxis and Shortness Of Breath    Respiratory  Tolerated  cefuroxime on 01/06/16   Strawberry Extract Anaphylaxis   Cefepime Itching    Empiric antibiotic, developed pruritis.    Erythromycin Itching   Sulfa Antibiotics Itching, Nausea And Vomiting and Nausea Only    Social History:   Social History   Socioeconomic History   Marital status: Widowed    Spouse name: Not on file   Number of children: Not on file   Years of education: Not on file   Highest education level: Not on file  Occupational History   Not on file  Tobacco Use   Smoking status: Every Day    Packs/day: 0.25    Years: 0.00    Total pack years: 0.00    Types: Cigarettes   Smokeless tobacco: Never  Vaping Use   Vaping Use: Never used  Substance and Sexual Activity   Alcohol use: Not Currently   Drug use: Never   Sexual activity: Not Currently    Birth control/protection: Abstinence  Other Topics Concern   Not on file  Social History Narrative   Not on file   Social Determinants of Health   Financial Resource Strain: Not on file  Food Insecurity: No Food Insecurity (05/23/2022)   Hunger Vital Sign    Worried About Running Out of Food in the Last Year: Never true    Ran Out of Food in the Last Year: Never true  Transportation Needs: No Transportation Needs (05/23/2022)   PRAPARE - Hydrologist (Medical): No    Lack of Transportation (Non-Medical): No  Physical Activity: Not on file  Stress: Not on file  Social Connections: Not on file  Intimate Partner Violence: Not At Risk (05/23/2022)   Humiliation, Afraid, Rape, and Kick questionnaire    Fear of Current or Ex-Partner: No    Emotionally Abused: No    Physically Abused: No    Sexually Abused: No    Family History:   Family History  Problem Relation Age of Onset   Cancer Mother        "femal cancer"   Prostate cancer Father      ROS:  Please see the history of present illness.  All other ROS reviewed and negative.     Physical Exam/Data:   Vitals:   08/04/22  0046 08/04/22 0115 08/04/22 0130 08/04/22 0149  BP: 114/66 107/69 96/60 104/69  Pulse: 70 63 77 70  Resp: (!) 26 16 (!) 26 (!) 24  Temp: 98 F (36.7 C)     TempSrc: Oral     SpO2: 97% 98% 98% 98%   No intake or output data in the 24 hours ending 08/04/22 0218    08/03/2022   10:16 PM 07/31/2022   10:03 AM 06/14/2022   11:24 AM  Last 3 Weights  Weight (lbs) 187 lb 187 lb 4 oz 183 lb  Weight (kg) 84.823 kg 84.936 kg 83.008 kg     There is no height  or weight on file to calculate BMI.  General:  Well nourished, well developed, in no acute distress HEENT: normal Neck: no JVD Vascular: No carotid bruits; Distal pulses 2+ bilaterally Cardiac:  normal S1, S2; RRR; no murmur  Lungs:  clear to auscultation bilaterally, no wheezing, rhonchi or rales  Abd: soft, nontender, no hepatomegaly  Ext: no edema Musculoskeletal:  No deformities, BUE and BLE strength normal and equal Skin: warm and dry  Neuro:  CNs 2-12 intact, no focal abnormalities noted Psych:  Normal affect   EKG:  The EKG was personally reviewed and demonstrates: Paced EKG  Relevant CV Studies:  Underwent RCA stenting followed by CABG x2 with a LIMA-D1, SVG-> OM-2. Underwent CRT-D in October 2021.     Echo 8/23 EF 25-30% with mild concentric LVH, normal RV function, severe LAE, and mild MR/AI.  He was hospitalized in August 2023 with ADHF.   He was readmitted in September due to COPD exacerbation with readmission a day later secondary to COVID-19 infection, non-STEMI (troponin 1500), paroxysmal atrial fibrillation, hypertension, acute kidney injury, and CHF requiring diuresis.  During that admission, he initially required holding of beta-blocker and ACE inhibitor therapy but we were able to resume low-dose beta-blocker and he also required digoxin for elevated A-fib rates.  Following adequate diuresis and recovered from COVID, he was discharged back to assisted living.    Seen 04/12/22 in Cardiology Clinic with NYHA III-IIB  symptoms. Both beta-blocker and ACE inhibitor discontinued secondary to relative hypotension.  He was readmitted October 16 with ADHF.  In the ER, he was tachycardic with mild troponin elevation of 168.  He was treated with Solu-Medrol and DuoNebs.  Device interrogation was notable for VT that degenerated to VF resulting in ICD shock of which, the patient was unaware.  He was seen by EP and placed on amio with recommendation for catheterization.  Cath performed and revealed 2 of 2 patent grafts with moderate but patent RCA grafts and distal RCA bifurcation disease up to 85% with normal iFR.  Medical therapy was recommended and he was subsequently discharged.  Laboratory Data:  High Sensitivity Troponin:   Recent Labs  Lab 08/03/22 1935 08/03/22 2137  TROPONINIHS 88* 144*     Chemistry Recent Labs  Lab 08/03/22 1935  NA 129*  K 4.0  CL 93*  CO2 23  GLUCOSE 193*  BUN 34*  CREATININE 2.28*  CALCIUM 9.1  MG 2.4  GFRNONAA 31*  ANIONGAP 13    Recent Labs  Lab 08/03/22 1935  PROT 7.8  ALBUMIN 3.6  AST 35  ALT 21  ALKPHOS 60  BILITOT 0.7   Lipids No results for input(s): "CHOL", "TRIG", "HDL", "LABVLDL", "LDLCALC", "CHOLHDL" in the last 168 hours.  Hematology Recent Labs  Lab 08/03/22 1935  WBC 8.2  RBC 4.88  HGB 14.2  HCT 42.6  MCV 87.3  MCH 29.1  MCHC 33.3  RDW 17.1*  PLT 223   Thyroid No results for input(s): "TSH", "FREET4" in the last 168 hours.  BNP Recent Labs  Lab 08/03/22 1935  BNP 715.9*    DDimer No results for input(s): "DDIMER" in the last 168 hours.   Radiology/Studies:  DG Chest Portable 1 View  Result Date: 08/04/2022 CLINICAL DATA:  Chest pain and tachycardia EXAM: PORTABLE CHEST 1 VIEW COMPARISON:  05/22/2022 FINDINGS: Cardiac shadow is enlarged but stable. Postsurgical changes are seen. Defibrillator is again noted. Lungs are well aerated without focal infiltrate or sizable effusion. No acute bony abnormality is  seen. Prior fixation of the  left clavicle is again seen. IMPRESSION: No acute abnormality noted. Electronically Signed   By: Alcide Clever M.D.   On: 08/04/2022 01:46   CUP PACEART REMOTE DEVICE CHECK  Result Date: 08/02/2022 Scheduled remote reviewed. Normal device function.  Optivol crossed threshold 1/17 and is ongoing Next remote 91 days. [place]    Assessment and Plan:   # V tach # CAD s/p CABG # Chronic HFrEF # Elevated Troponin # AKI # pAF # COPD # Bipolar Disorder  - Had VT/VF  in 10/23, Cath at time with stable CAD - Recurrent VT in 11/23 K 2.7 at time - Now again VT- will start Amio Drip. Cverted in the ED - PPM Interrogation in AM - suspect scar-mediated VT vs end-stage CM  - Telemetry- EP consult in AM - Troponin elevated most likely due to demand - AKI- hold losartan and aldactone - Hold SGLT-2 - Continue Toprol 12.5 daily - Repeat Echo - Not candidate for advanced therapies with tobacco use, immobility -  Status post recent catheterization October 2023 revealing 2 of 2 patent grafts with moderate RCA in-stent restenosis and distal bifurcation disease with normal iFR.  Patient denies experiencing chest pain recently.   - Continue medical management. Continue apixaban    Signed, Hermelinda Dellen, MD  08/04/2022 2:18 AM

## 2022-08-04 NOTE — ED Notes (Signed)
ED TO INPATIENT HANDOFF REPORT  ED Nurse Name and Phone #:   S Name/Age/Gender Carl [name] 62 y.o. male Room/Bed: 002C/002C  Code Status   Code Status: Full Code  Home/SNF/Other Nursing Home Patient oriented to: self Is this baseline? Yes   Triage Complete: Triage complete  Chief Complaint Ventricular tachycardia ([place]) [I47.20]  Triage Note Pt bib [facility] from [facility] for cardiology consult. Pt from facility, reported 9/10 chest pain, found to be in vtach, given amio by ems with no conversion. Sync Cardioverted at [facility] with success, now in paced rhythm. Medtronic pacemaker.   Allergies Allergies  Allergen Reactions   Iodinated Contrast Media Shortness Of Breath   Penicillins Anaphylaxis and Shortness Of Breath    Respiratory  Tolerated cefuroxime on 01/06/16   Strawberry Extract Anaphylaxis   Cefepime Itching    Empiric antibiotic, developed pruritis.    Erythromycin Itching   Sulfa Antibiotics Itching, Nausea And Vomiting and Nausea Only    Level of Care/Admitting Diagnosis ED Disposition     ED Disposition  Admit   Condition  --   [place]: [place] [[id number]]  Level of Care: Progressive [102]  Admit to Progressive based on following criteria: CARDIOVASCULAR & THORACIC of moderate stability with acute coronary syndrome symptoms/low risk myocardial infarction/hypertensive urgency/arrhythmias/heart failure potentially compromising stability and stable post cardiovascular intervention patients.  May admit patient to [facility] or [facility] if equivalent level of care is available:: No  Covid Evaluation: Asymptomatic - no recent exposure (last 10 days) testing not required  Diagnosis: Ventricular tachycardia [facility]) [id number]  Admitting Physician: Vianne Bulls [id number]  Attending Physician: Vianne Bulls [id number]  Certification:: I certify this patient will need inpatient services for at least 2 midnights  Estimated  Length of Stay: 3          B Medical/Surgery History Past Medical History:  Diagnosis Date   Acute pulmonary edema ([place]) 2017   Acute respiratory failure with hypoxia ([place]) 2017   Bipolar 1 disorder ([place])    CAD (coronary artery disease)    a. Prior RCA stenting; b. 2017 s/p CABG x 2 (LIMA->D1, VG->OM2); b. 04/2022 Demand Isch/Cath: LM mild dzs, LAD 46m, D1 100, LCX mod diff dzs, OM2 100, OM3 50, RCA 40/20p ISR, 85d (iFR 0.99), 50d, RPAV 60, LIMA->D1 small, patent, VG->OM2 large, mild dzs-->Med Rx.   Chronic HFrEF (heart failure with reduced ejection fraction) ([place])    a. 02/2022 Echo: EF 25-30%, mild conc LVH, nl RV fxn, sev dil LA, mildly dil RA, mild MR/AI.   CKD (chronic kidney disease), stage III (HCC)    COPD (chronic obstructive pulmonary disease) ([place])    Depression    Hypertension    Ischemic cardiomyopathy    a. 04/2020 s/p MDT MRI compatible CRT-D (Ser # [id number]); b. 02/2022 Echo: EF 25-30%.   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   Seizures ([place])    childhood   Stroke [facility])    Tobacco abuse    Past Surgical History:  Procedure Laterality Date   BIV UPGRADE N/A 05/05/2020   Procedure: BIV ICD UPGRADE;  Surgeon: Deboraha Sprang, MD;  Location: [place] CV LAB;  Service: Cardiovascular;  Laterality: N/A;   CORONARY ARTERY BYPASS GRAFT     INTRAVASCULAR PRESSURE WIRE/FFR STUDY N/A 04/25/2022   Procedure: INTRAVASCULAR PRESSURE WIRE/FFR STUDY;  Surgeon: Nelva Bush, MD;  Location: [place] CV LAB;  Service: Cardiovascular;  Laterality: N/A;   RIGHT/LEFT HEART CATH AND CORONARY/GRAFT ANGIOGRAPHY N/A 04/25/2022   Procedure: RIGHT/LEFT HEART CATH AND CORONARY/GRAFT ANGIOGRAPHY;  Surgeon: Nelva Bush, MD;  Location: [place] CV LAB;  Service: Cardiovascular;  Laterality: N/A;     A IV Location/Drains/Wounds Patient  Lines/Drains/Airways Status     Active Line/Drains/Airways     Name Placement date Placement time Site Days   Peripheral IV 08/03/22 20 G 1" Right Antecubital 08/03/22  1936  Antecubital  1   Peripheral IV 08/03/22 20 G 1" Posterior;Left Hand 08/03/22  1945  Hand  1   External Urinary Catheter 08/03/22  2352  --  1            Intake/Output Last 24 hours  Intake/Output Summary (Last 24 hours) at 08/04/2022 1637 Last data filed at 08/04/2022 [id number] Gross per 24 hour  Intake --  Output 375 ml  Net -375 ml    Labs/Imaging Results for orders placed or performed during the hospital encounter of 08/04/22 (from the past 48 hour(s))  Troponin I (High Sensitivity)     Status: Abnormal   Collection Time: 08/04/22  1:33 AM  Result Value Ref Range   Troponin I (High Sensitivity) 355 (HH) <18 ng/L    Comment: CRITICAL RESULT CALLED TO, READ BACK BY AND VERIFIED WITH A.THOMPSON,RN. [id number] 08/04/22. LPAIT (NOTE) Elevated high sensitivity troponin I (hsTnI) values and significant  changes across serial measurements may suggest ACS but many other  chronic and acute conditions are known to elevate hsTnI results.  Refer to the "Links" section for chest pain algorithms and additional  guidance. Performed at [facility], [place] [address], [place], [place] [address]   Protime-INR     Status: None   Collection Time: 08/04/22  1:53 AM  Result Value Ref Range   Prothrombin Time 14.7 11.4 - 15.2 seconds   INR 1.2 0.8 - 1.2    Comment: (NOTE) INR goal varies based on device and disease states. Performed at [facility], [place] [address], [place], [place] [address]   Troponin I (High Sensitivity)     Status: Abnormal   Collection Time: 08/04/22  4:24 AM  Result Value Ref Range   Troponin I (High Sensitivity) 393 (HH) <18 ng/L    Comment: CRITICAL VALUE NOTED. VALUE IS CONSISTENT WITH PREVIOUSLY REPORTED/CALLED VALUE (NOTE) Elevated high sensitivity troponin I (hsTnI) values and  significant  changes across serial measurements may suggest ACS but many other  chronic and acute conditions are known to elevate hsTnI results.  Refer to the "Links" section for chest pain algorithms and additional  guidance. Performed at [facility], [place] [address] [place], [place] [address]   Brain natriuretic peptide     Status: Abnormal   Collection Time: 08/04/22  4:24 AM  Result Value Ref Range   B Natriuretic Peptide 681.9 (H) 0.0 - 100.0 pg/mL    Comment: Performed at [facility] [address], [place], [place] [address]  HIV Antibody (routine testing w rflx)     Status: None   Collection Time: 08/04/22  5:46 AM  Result Value Ref Range   HIV Screen 4th Generation wRfx Non Reactive Non Reactive    Comment: Performed at [facility], [place] [address] [place],  [id number]  Basic metabolic panel     Status: Abnormal   Collection Time: 08/04/22  5:46 AM  Result Value Ref  Range   Sodium 131 (L) 135 - 145 mmol/L   Potassium 4.7 3.5 - 5.1 mmol/L   Chloride 97 (L) 98 - 111 mmol/L   CO2 22 22 - 32 mmol/L   Glucose, Bld 104 (H) 70 - 99 mg/dL    Comment: Glucose reference range applies only to samples taken after fasting for at least 8 hours.   BUN 32 (H) 8 - 23 mg/dL   Creatinine, Ser 2.11 (H) 0.61 - 1.24 mg/dL   Calcium 8.9 8.9 - 10.3 mg/dL   GFR, Estimated 35 (L) >60 mL/min    Comment: (NOTE) Calculated using the CKD-EPI Creatinine Equation (2021)    Anion gap 12 5 - 15    Comment: Performed at [place] [address], [place], [place] [address]  Magnesium     Status: None   Collection Time: 08/04/22  5:46 AM  Result Value Ref Range   Magnesium 2.4 1.7 - 2.4 mg/dL    Comment: Performed at [facility] [address], [place], [place] [address]  CBC     Status: Abnormal   Collection Time: 08/04/22  5:46 AM  Result Value Ref Range   WBC 9.2 4.0 - 10.5 K/uL   RBC 4.46 4.22 - 5.81 MIL/uL   Hemoglobin 13.2 13.0 - 17.0 g/dL   HCT  38.5 (L) 39.0 - 52.0 %   MCV 86.3 80.0 - 100.0 fL   MCH 29.6 26.0 - 34.0 pg   MCHC 34.3 30.0 - 36.0 g/dL   RDW 17.2 (H) 11.5 - 15.5 %   Platelets 217 150 - 400 K/uL   nRBC 0.0 0.0 - 0.2 %    Comment: Performed at [facility], [place] [address] [place], [place] [address]  Urinalysis, Complete w Microscopic -Urine, Clean Catch     Status: Abnormal   Collection Time: 08/04/22  5:46 AM  Result Value Ref Range   Color, Urine YELLOW YELLOW   APPearance CLEAR CLEAR   Specific Gravity, Urine 1.014 1.005 - 1.030   pH 5.0 5.0 - 8.0   Glucose, UA >=500 (A) NEGATIVE mg/dL   Hgb urine dipstick NEGATIVE NEGATIVE   Bilirubin Urine NEGATIVE NEGATIVE   Ketones, ur NEGATIVE NEGATIVE mg/dL   Protein, ur 30 (A) NEGATIVE mg/dL   Nitrite NEGATIVE NEGATIVE   Leukocytes,Ua NEGATIVE NEGATIVE   RBC / HPF 0-5 0 - 5 RBC/hpf   WBC, UA 0-5 0 - 5 WBC/hpf   Bacteria, UA RARE (A) NONE SEEN   Squamous Epithelial / HPF 0-5 0 - 5 /HPF   Hyaline Casts, UA PRESENT     Comment: Performed at [facility], [address] [place], [place] [address]  Creatinine, urine, random     Status: None   Collection Time: 08/04/22  5:46 AM  Result Value Ref Range   Creatinine, Urine 129 mg/dL    Comment: Performed at [facility] [address], [place], [place] [address]  Sodium, urine, random     Status: None   Collection Time: 08/04/22  5:46 AM  Result Value Ref Range   Sodium, Ur 16 mmol/L    Comment: Performed at [facility] [address], [place], [place] [address]  Uric acid     Status: Abnormal   Collection Time: 08/04/22  5:46 AM  Result Value Ref Range   Uric Acid, Serum 11.1 (H) 3.7 - 8.6 mg/dL    Comment: HEMOLYSIS AT THIS LEVEL MAY  AFFECT RESULT Performed at [facility], [address] [place], [place] [address]   CBG monitoring, ED     Status: Abnormal   Collection Time: 08/04/22  8:25 AM  Result Value Ref Range   Glucose-Capillary 111 (H) 70 - 99 mg/dL    Comment:  Glucose reference range applies only to samples taken after fasting for at least 8 hours.  CBG monitoring, ED     Status: Abnormal   Collection Time: 08/04/22 11:52 AM  Result Value Ref Range   Glucose-Capillary 114 (H) 70 - 99 mg/dL    Comment: Glucose reference range applies only to samples taken after fasting for at least 8 hours.   Comment 1 Notify RN    Comment 2 Document in Chart    US RENAL  Result Date: 08/04/2022 CLINICAL DATA:  Acute renal failure EXAM: RENAL / URINARY TRACT ULTRASOUND COMPLETE COMPARISON:  CT scan 03/14/2022 FINDINGS: Right Kidney: Renal measurements: 9.7 x 5.2 x 5.5 cm = volume: 145 mL. Echogenicity within normal limits. No mass or hydronephrosis visualized. Left Kidney: Renal measurements: 11.6 x 5.4 x 4.5 cm = volume: 148 mL. 15 mm cyst identified towards the upper pole, similar to prior CT. 13 mm interpolar subcapsular cyst evident, also present on prior CT although more subtle. No hydronephrosis. Bladder: Appears normal for degree of bladder distention. Other: Liver parenchyma appears echogenic suggesting fatty deposition. IMPRESSION: No hydronephrosis. Sonographic features suggesting hepatic steatosis. Electronically Signed   By: Kennith Center M.D.   On: 08/04/2022 06:49   DG Chest Portable 1 View  Result Date: 08/04/2022 CLINICAL DATA:  Chest pain and tachycardia EXAM: PORTABLE CHEST 1 VIEW COMPARISON:  05/22/2022 FINDINGS: Cardiac shadow is enlarged but stable. Postsurgical changes are seen. Defibrillator is again noted. Lungs are well aerated without focal infiltrate or sizable effusion. No acute bony abnormality is seen. Prior fixation of the left clavicle is again seen. IMPRESSION: No acute abnormality noted. Electronically Signed   By: Alcide Clever M.D.   On: 08/04/2022 01:46    Pending Labs Unresulted Labs (From admission, onward)     Start     Ordered   08/05/22 0500  Magnesium  Daily,   R     Question:  Specimen collection method  Answer:  Lab=Lab  collect   08/04/22 0607   08/05/22 0500  CBC with Differential/Platelet  Daily,   R     Question:  Specimen collection method  Answer:  Lab=Lab collect   08/04/22 0607   08/05/22 0500  Brain natriuretic peptide  Daily,   R     Question:  Specimen collection method  Answer:  Lab=Lab collect   08/04/22 0607   08/05/22 0500  Basic metabolic panel  Daily,   R     Question:  Specimen collection method  Answer:  Lab=Lab collect   08/04/22 0607   08/04/22 0607  Osmolality  Once,   AD       Question:  Specimen collection method  Answer:  Lab=Lab collect   08/04/22 0607   08/04/22 0607  Osmolality, urine  Once,   AD        08/04/22 0607   08/04/22 0500  Basic metabolic panel  Daily,   R      08/04/22 0438   08/04/22 0443  Urea nitrogen, urine  Once,   R        08/04/22 0442            Vitals/Pain Today's Vitals  08/04/22 1200 08/04/22 1259 08/04/22 1317 08/04/22 1403  BP:   114/71   Pulse:   73   Resp:   17   Temp:      TempSrc:      SpO2:   96%   PainSc: 0-No pain 0-No pain  0-No pain    Isolation Precautions No active isolations  Medications Medications  amiodarone (NEXTERONE PREMIX) 360-4.14 MG/200ML-% (1.8 mg/mL) IV infusion (0 mg/hr Intravenous Stopped 08/04/22 0756)  amiodarone (NEXTERONE PREMIX) 360-4.14 MG/200ML-% (1.8 mg/mL) IV infusion (30 mg/hr Intravenous Rate/Dose Verify 08/04/22 1144)  metoprolol succinate (TOPROL-XL) 24 hr tablet 12.5 mg (has no administration in time range)  rosuvastatin (CRESTOR) tablet 10 mg (10 mg Oral Given 08/04/22 0932)  escitalopram (LEXAPRO) tablet 10 mg (has no administration in time range)  hydrOXYzine (ATARAX) tablet 10 mg (10 mg Oral Given 08/04/22 0933)  QUEtiapine (SEROQUEL) tablet 200 mg (has no administration in time range)  levothyroxine (SYNTHROID) tablet 50 mcg (50 mcg Oral Given 08/04/22 0645)  pantoprazole (PROTONIX) EC tablet 40 mg (40 mg Oral Given 08/04/22 0933)  apixaban (ELIQUIS) tablet 5 mg (5 mg Oral Given 08/04/22  0933)  divalproex (DEPAKOTE ER) 24 hr tablet 500 mg (500 mg Oral Given 08/04/22 0942)  levETIRAcetam (KEPPRA) tablet 250 mg (250 mg Oral Given 08/04/22 0933)  albuterol (PROVENTIL) (2.5 MG/3ML) 0.083% nebulizer solution 2.5 mg (has no administration in time range)  umeclidinium bromide (INCRUSE ELLIPTA) 62.5 MCG/ACT 1 puff (1 puff Inhalation Given 08/04/22 0858)  insulin aspart (novoLOG) injection 0-6 Units ( Subcutaneous Not Given 08/04/22 1226)  insulin aspart (novoLOG) injection 0-5 Units (has no administration in time range)  sodium chloride flush (NS) 0.9 % injection 3 mL (3 mLs Intravenous Not Given 08/04/22 0900)  acetaminophen (TYLENOL) tablet 650 mg (has no administration in time range)    Or  acetaminophen (TYLENOL) suppository 650 mg (has no administration in time range)  oxyCODONE (Oxy IR/ROXICODONE) immediate release tablet 5 mg (has no administration in time range)  isosorbide mononitrate (IMDUR) 24 hr tablet 15 mg (15 mg Oral Given 08/04/22 1049)  nitroGLYCERIN (NITROSTAT) SL tablet 0.4 mg (has no administration in time range)  lactated ringers infusion ( Intravenous New Bag/Given 08/04/22 1052)  mexiletine (MEXITIL) capsule 250 mg (250 mg Oral Given 08/04/22 1256)    Mobility walks with device     Focused Assessments    R Recommendations: See Admitting Provider Note  Report given to:   Additional Notes:

## 2022-08-04 NOTE — H&P (Signed)
History and Physical    Carl [name] BJY:[id number] DOB: Oct 20, 1960 DOA: 08/04/2022  PCP: [facility]   Patient coming from: ALF   Chief Complaint: Chest pain, VT  HPI: Carl [name] is a pleasant 62 y.o. male with medical history significant for hypertension, type 2 diabetes mellitus, bipolar disorder, CAD status post CABG, history of seizure, history of stroke, chronic systolic CHF, and atrial fibrillation on Eliquis who presents emergency department for evaluation of ventricular tachycardia.  Patient reports that he had been in his usual state of health when he went outside to smoke a cigarette, but then developed acute onset of severe and sharp chest pain.  EMS was called and found the patient to be in ventricular tachycardia.  115 mg of IV amiodarone was administered and he was taken to [facility] emergency department where he underwent synchronized cardioversion and was then transferred to [facility] ED at the recommendation of cardiologist, Dr. Radford Pax.  ED Course: Upon arrival to the ED, patient is found to be afebrile and saturating well on room air with normal heart rate and systolic blood pressures in the low 100s.  He is now in a paced rhythm on EKG chest x-ray was negative for acute cardiopulmonary disease.  Blood work notable for sodium 129, BUN 34, creatinine 2.28, troponin 88, and BNP 716.  Cardiology was consulted by the ED PA and the patient was started on amiodarone infusion.  Review of Systems:  All other systems reviewed and apart from HPI, are negative.  Past Medical History:  Diagnosis Date   Acute pulmonary edema ([place]) 2017   Acute respiratory failure with hypoxia ([place]) 2017   Bipolar 1 disorder (HCC)    CAD (coronary artery disease)    a. Prior RCA stenting; b. 2017 s/p CABG x 2 (LIMA->D1, VG->OM2); b. 04/2022 [facility]/Cath: LM mild dzs, LAD 66m, D1 100, LCX mod diff dzs, OM2 100, OM3 50, RCA 40/20p ISR, 85d (iFR 0.99), 50d, RPAV 60, LIMA->D1 small,  patent, VG->OM2 large, mild dzs-->Med Rx.   Chronic HFrEF (heart failure with reduced ejection fraction) ([place])    a. 02/2022 Echo: EF 25-30%, mild conc LVH, nl RV fxn, sev dil LA, mildly dil RA, mild MR/AI.   CKD (chronic kidney disease), stage III (HCC)    COPD (chronic obstructive pulmonary disease) ([place])    Depression    Hypertension    Ischemic cardiomyopathy    a. 04/2020 s/p MDT MRI compatible CRT-D (Ser # [id number]); b. 02/2022 Echo: EF 25-30%.   Myocardial infarction 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   Seizures ([place])    childhood   Stroke [facility])    Tobacco abuse     Past Surgical History:  Procedure Laterality Date   BIV UPGRADE N/A 05/05/2020   Procedure: BIV ICD UPGRADE;  Surgeon: Deboraha Sprang, MD;  Location: [place] [facility];  Service: Cardiovascular;  Laterality: N/A;   CORONARY ARTERY BYPASS GRAFT     INTRAVASCULAR PRESSURE WIRE/FFR STUDY N/A 04/25/2022   Procedure: INTRAVASCULAR PRESSURE WIRE/FFR STUDY;  Surgeon: Nelva Bush, MD;  Location: [place] [facility];  Service: Cardiovascular;  Laterality: N/A;   RIGHT/LEFT HEART CATH AND CORONARY/GRAFT ANGIOGRAPHY N/A 04/25/2022   Procedure: RIGHT/LEFT HEART CATH AND CORONARY/GRAFT ANGIOGRAPHY;  Surgeon: Nelva Bush, MD;  Location: [place] [facility];  Service: Cardiovascular;  Laterality: N/A;    Social History:   reports that he has been smoking  cigarettes. He has been smoking an average of 0.25 packs per day. He has never used smokeless tobacco. He reports that he does not currently use alcohol. He reports that he does not use drugs.  Allergies  Allergen Reactions   Iodinated Contrast Media Shortness Of Breath   Penicillins Anaphylaxis and Shortness Of Breath    Respiratory  Tolerated cefuroxime on 01/06/16   Strawberry Extract Anaphylaxis   Cefepime Itching    Empiric antibiotic, developed pruritis.     Erythromycin Itching   Sulfa Antibiotics Itching, Nausea And Vomiting and Nausea Only    Family History  Problem Relation Age of Onset   Cancer Mother        "femal cancer"   Prostate cancer Father      Prior to Admission medications   Medication Sig Start Date End Date Taking? Authorizing Provider  acetaminophen (TYLENOL) 500 MG tablet Take 500 mg by mouth every 6 (six) hours as needed for mild pain.    [provider]  albuterol (PROVENTIL) (2.5 MG/3ML) 0.083% nebulizer solution Take 3 mLs (2.5 mg total) by nebulization every 6 (six) hours as needed for wheezing or shortness of breath. 05/25/22   Loletha Grayer, MD  albuterol (VENTOLIN HFA) 108 (90 Base) MCG/ACT inhaler Inhale 2 puffs into the lungs every 6 (six) hours as needed for wheezing. 05/25/22   Loletha Grayer, MD  amiodarone (PACERONE) 400 MG tablet Take 1 tablet (400 mg total) by mouth daily. 06/14/22   Furth, Cadence H, PA-C  apixaban (ELIQUIS) 5 MG TABS tablet TAKE 1 TABLET BY MOUTH TWICE A DAY Patient taking differently: Take 5 mg by mouth 2 (two) times daily. 01/18/21   Deboraha Sprang, MD  dapagliflozin propanediol (FARXIGA) 10 MG TABS tablet Take 1 tablet (10 mg total) by mouth daily. 05/01/22   Fritzi Mandes, MD  divalproex (DEPAKOTE ER) 500 MG 24 hr tablet Take 1 tablet (500 mg total) by mouth 2 (two) times daily. 05/24/19   Clapacs, Madie Reno, MD  escitalopram (LEXAPRO) 10 MG tablet Take 1 tablet (10 mg total) by mouth at bedtime. 05/24/19   Clapacs, Madie Reno, MD  ezetimibe (ZETIA) 10 MG tablet Take 1 tablet (10 mg total) by mouth daily. 04/01/22   Annita Brod, MD  furosemide (LASIX) 20 MG tablet Take 3 tablets (60 mg total) by mouth 2 (two) times daily. 05/25/22   Loletha Grayer, MD  HYDROcodone-acetaminophen (NORCO/VICODIN) 5-325 MG tablet Take 1 tablet by mouth every 6 (six) hours as needed for severe pain. 05/25/22   Loletha Grayer, MD  Hydrocortisone Acetate 1 % CREA Apply TID 05/15/22   [provider]  hydrOXYzine (ATARAX) 10 MG tablet Take 10 mg by mouth 2 (two) times daily. 12/24/21   [provider]  isosorbide mononitrate (IMDUR) 30 MG 24 hr tablet Take 0.5 tablets (15 mg total) by mouth daily. 05/26/22   Loletha Grayer, MD  levETIRAcetam (KEPPRA) 250 MG tablet Take 1 tablet (250 mg total) by mouth 2 (two) times daily. 03/24/20   Harvest Dark, MD  levothyroxine (SYNTHROID) 25 MCG tablet Take 1 tablet (25 mcg total) by mouth daily. Patient taking differently: Take 50 mcg by mouth daily. 05/24/19   Clapacs, Madie Reno, MD  losartan (COZAAR) 25 MG tablet Take 0.5 tablets (12.5 mg total) by mouth daily. 05/01/22   Fritzi Mandes, MD  metoprolol succinate (TOPROL-XL) 25 MG 24 hr tablet Take 0.5 tablets (12.5 mg total) by mouth at bedtime. 04/30/22   Fritzi Mandes, MD  Multiple Vitamin (MULTIVITAMIN WITH MINERALS) TABS tablet Take 1 tablet by mouth daily. 05/01/22   Enedina Finner, MD  nitroGLYCERIN (NITROSTAT) 0.4 MG SL tablet Place 1 tablet (0.4 mg total) under the tongue every 5 (five) minutes as needed for chest pain. 05/25/22   Alford Highland, MD  omeprazole (PRILOSEC) 20 MG capsule Take 20 mg by mouth daily.    [provider]  potassium chloride SA (KLOR-CON M) 20 MEQ tablet Take 2 tablets (40 mEq total) by mouth daily. 06/14/22   Furth, Cadence H, PA-C  QUEtiapine (SEROQUEL) 200 MG tablet Take 200 mg by mouth at bedtime.    [provider]  rosuvastatin (CRESTOR) 10 MG tablet Take 1 tablet (10 mg total) by mouth daily. 05/16/22   Delma Freeze, FNP  spironolactone (ALDACTONE) 25 MG tablet Take 1 tablet (25 mg total) by mouth daily. 05/26/22   Alford Highland, MD  tiotropium (SPIRIVA) 18 MCG inhalation capsule Place 18 mcg into inhaler and inhale daily.    [provider]  traMADol (ULTRAM) 50 MG tablet Take 50 mg by mouth every 12 (twelve) hours as needed for moderate pain.    [provider]    Physical Exam: Vitals:   08/04/22  0345 08/04/22 0400 08/04/22 0415 08/04/22 0430  BP: 104/60 107/66 108/67 107/68  Pulse: 63 62 61 61  Resp: 19 20 (!) 21 18  Temp:      TempSrc:      SpO2: 93% 93% 95% 93%     Constitutional: NAD, calm  Eyes: PERTLA, lids and conjunctivae normal ENMT: Mucous membranes are moist. Posterior pharynx clear of any exudate or lesions.   Neck: supple, no masses  Respiratory: no wheezing, no crackles. No accessory muscle use.  Cardiovascular: S1 & S2 heard, regular rate and rhythm. No extremity edema.   Abdomen: No distension, no tenderness, soft. Bowel sounds active.  Musculoskeletal: no clubbing / cyanosis. No joint deformity upper and lower extremities.   Skin: no significant rashes, lesions, ulcers. Warm, dry, well-perfused. Neurologic: CN 2-12 grossly intact. Moving all extremities. Alert and oriented.  Psychiatric: Pleasant. Cooperative.    Labs and Imaging on Admission: I have personally reviewed following labs and imaging studies  CBC: Recent Labs  Lab 08/03/22 1935  WBC 8.2  NEUTROABS 4.8  HGB 14.2  HCT 42.6  MCV 87.3  PLT 223   Basic Metabolic Panel: Recent Labs  Lab 08/03/22 1935  NA 129*  K 4.0  CL 93*  CO2 23  GLUCOSE 193*  BUN 34*  CREATININE 2.28*  CALCIUM 9.1  MG 2.4  PHOS 4.6   GFR: Estimated Creatinine Clearance: 33.2 mL/min (A) (by C-G formula based on SCr of 2.28 mg/dL (H)). Liver Function Tests: Recent Labs  Lab 08/03/22 1935  AST 35  ALT 21  ALKPHOS 60  BILITOT 0.7  PROT 7.8  ALBUMIN 3.6   No results for input(s): "LIPASE", "AMYLASE" in the last 168 hours. No results for input(s): "AMMONIA" in the last 168 hours. Coagulation Profile: Recent Labs  Lab 08/04/22 0153  INR 1.2   Cardiac Enzymes: No results for input(s): "CKTOTAL", "CKMB", "CKMBINDEX", "TROPONINI" in the last 168 hours. BNP (last 3 results) No results for input(s): "PROBNP" in the last 8760 hours. HbA1C: No results for input(s): "HGBA1C" in the last 72  hours. CBG: No results for input(s): "GLUCAP" in the last 168 hours. Lipid Profile: No results for input(s): "CHOL", "HDL", "LDLCALC", "TRIG", "CHOLHDL", "LDLDIRECT" in the last 72 hours. Thyroid Function Tests:  No results for input(s): "TSH", "T4TOTAL", "FREET4", "T3FREE", "THYROIDAB" in the last 72 hours. Anemia Panel: No results for input(s): "VITAMINB12", "FOLATE", "FERRITIN", "TIBC", "IRON", "RETICCTPCT" in the last 72 hours. Urine analysis:    Component Value Date/Time   COLORURINE YELLOW (A) 03/14/2022 1850   APPEARANCEUR CLEAR (A) 03/14/2022 1850   LABSPEC 1.012 03/14/2022 1850   PHURINE 6.0 03/14/2022 1850   GLUCOSEU NEGATIVE 03/14/2022 1850   HGBUR NEGATIVE 03/14/2022 1850   BILIRUBINUR NEGATIVE 03/14/2022 1850   KETONESUR NEGATIVE 03/14/2022 1850   PROTEINUR 100 (A) 03/14/2022 1850   NITRITE NEGATIVE 03/14/2022 1850   LEUKOCYTESUR NEGATIVE 03/14/2022 1850   Sepsis Labs: @LABRCNTIP (procalcitonin:4,lacticidven:4) )No results found for this or any previous visit (from the past 240 hour(s)).   Radiological Exams on Admission: DG Chest Portable 1 View  Result Date: 08/04/2022 CLINICAL DATA:  Chest pain and tachycardia EXAM: PORTABLE CHEST 1 VIEW COMPARISON:  05/22/2022 FINDINGS: Cardiac shadow is enlarged but stable. Postsurgical changes are seen. Defibrillator is again noted. Lungs are well aerated without focal infiltrate or sizable effusion. No acute bony abnormality is seen. Prior fixation of the left clavicle is again seen. IMPRESSION: No acute abnormality noted. Electronically Signed   By: 05/24/2022 M.D.   On: 08/04/2022 01:46    EKG: Independently reviewed. Paced rhythm.   Assessment/Plan   1. Ventricular tachycardia  - Appreciate cardiology consultation  - Continue cardiac monitoring, amiodarone infusion, PPM interrogation in am, follow-up EP recommendations    2. AKI superimposed on CKD IIIa  - SCr is 2.28 on admission, up from 1.65 last month   - Check UA  with microscopy and FEUrea, hold diuretics and ARB, renally-dose medications, repeat chem panel in am    3. Chronic HFrEF  - Appears compensated  - Hold diuretics and ARB initially in setting of AKI, monitor weight and I/Os   4. PAF - Continue Eliqius and Toprol    5. Elevated troponin; CAD - No anginal complaints  - Troponin elevated, likely related to oxygen supply-demand mismatch in setting of VT  - Continue statin and beta-blocker, follow-up echo findings    6. Bipolar disorder  - Continue Lexapro, Atarax, and Seroquel    7. Hx of seizures  - Continue Depakote and Keppra    8. Type II DM  - A1c was 6.6% in September 2023  - Check CBGs and use low-intensity SSI for now   9. Hypothyroidism  - Continue Synthroid    DVT prophylaxis: Eliquis  Code Status: Full  Level of Care: Level of care: Progressive Family Communication: None present  Disposition Plan:  Patient is from: ALF  Anticipated d/c is to: TBD Anticipated d/c date is: 08/06/22  Patient currently: Pending EP eval, improved/stable renal function  Consults called: Cardiology  Admission status: Inpatient    08/08/22, MD [facility]  08/04/2022, 4:50 AM

## 2022-08-04 NOTE — ED Notes (Signed)
Patient transported to vascular. 

## 2022-08-04 NOTE — ED Notes (Signed)
RN placed pt on bedpan for BM

## 2022-08-04 NOTE — Consult Note (Signed)
Cardiology Consultation   Patient ID: Carl [name] MRN: [id number]; DOB: 1960/10/16  Admit date: 08/04/2022 Date of Consult: 08/04/2022  PCP:  Orvis Brill, [place] [facility] Cardiologist:  Kathlyn Sacramento, MD  Electrophysiologist:  Virl Axe, MD       Patient Profile:   Carl [name] is a 62 y.o. male with a hx of chronic systolic heart failure, ventricular tachycardia, bipolar disease, CKD who is being seen 08/04/2022 for the evaluation of VT at the request of [name].  History of Present Illness:   Carl [name] began having chest pain.  He had left-sided chest pressure.  He was smoking at the time his chest pressure began.  He was found to be in a wide-complex tachycardia.  He presented to [facility] and was cardioverted and then transferred to [facility].  He does have a history of ventricular tachycardia and was previously on amiodarone.  He has had a recent left heart catheterization with stable disease and no interventions performed.  Now that he is back in normal rhythm, he feels well and is without complaint.   Past Medical History:  Diagnosis Date   Acute pulmonary edema ([place]) 2017   Acute respiratory failure with hypoxia ([place]) 2017   Bipolar 1 disorder (HCC)    CAD (coronary artery disease)    a. Prior RCA stenting; b. 2017 s/p CABG x 2 (LIMA->D1, VG->OM2); b. 04/2022 [facility] Isch/Cath: LM mild dzs, LAD 28m, D1 100, LCX mod diff dzs, OM2 100, OM3 50, RCA 40/20p ISR, 85d (iFR 0.99), 50d, RPAV 60, LIMA->D1 small, patent, VG->OM2 large, mild dzs-->Med Rx.   Chronic HFrEF (heart failure with reduced ejection fraction) ([place])    a. 02/2022 Echo: EF 25-30%, mild conc LVH, nl RV fxn, sev dil LA, mildly dil RA, mild MR/AI.   CKD (chronic kidney disease), stage III (HCC)    COPD (chronic obstructive pulmonary disease) ([place])    Depression    Hypertension    Ischemic cardiomyopathy    a. 04/2020 s/p MDT MRI compatible CRT-D (Ser #  [id number]); b. 02/2022 Echo: EF 25-30%.   Myocardial infarction [facility])    NSTEMI (non-ST elevated myocardial infarction) ([place])    RVAD (right ventricular assist device) present ([place]) 01/06/2016   Seizures ([place])    childhood   Stroke [facility])    Tobacco abuse     Past Surgical History:  Procedure Laterality Date   BIV UPGRADE N/A 05/05/2020   Procedure: BIV ICD UPGRADE;  Surgeon: Deboraha Sprang, MD;  Location: [place] [facility];  Service: Cardiovascular;  Laterality: N/A;   CORONARY ARTERY BYPASS GRAFT     INTRAVASCULAR PRESSURE WIRE/FFR STUDY N/A 04/25/2022   Procedure: INTRAVASCULAR PRESSURE WIRE/FFR STUDY;  Surgeon: Nelva Bush, MD;  Location: [place] [facility];  Service: Cardiovascular;  Laterality: N/A;   RIGHT/LEFT HEART CATH AND CORONARY/GRAFT ANGIOGRAPHY N/A 04/25/2022   Procedure: RIGHT/LEFT HEART CATH AND CORONARY/GRAFT ANGIOGRAPHY;  Surgeon: Nelva Bush, MD;  Location: [place] [facility];  Service: Cardiovascular;  Laterality: N/A;     Home Medications:  Prior to Admission medications   Medication Sig Start Date End Date Taking? Authorizing Provider  acetaminophen (TYLENOL) 500 MG tablet Take 500 mg by mouth every 6 (six) hours as needed for mild pain.    [provider]  albuterol (PROVENTIL) (2.5 MG/3ML) 0.083% nebulizer solution Take 3 mLs (2.5 mg total) by nebulization every 6 (six) hours as needed for wheezing or shortness of breath. 05/25/22  Alford Highland, MD  albuterol (VENTOLIN HFA) 108 (90 Base) MCG/ACT inhaler Inhale 2 puffs into the lungs every 6 (six) hours as needed for wheezing. 05/25/22   Alford Highland, MD  amiodarone (PACERONE) 400 MG tablet Take 1 tablet (400 mg total) by mouth daily. 06/14/22   Furth, Cadence H, PA-C  apixaban (ELIQUIS) 5 MG TABS tablet TAKE 1 TABLET BY MOUTH TWICE A DAY Patient taking differently: Take 5 mg by mouth 2 (two) times daily. 01/18/21   Duke Salvia, MD  dapagliflozin propanediol (FARXIGA) 10 MG TABS  tablet Take 1 tablet (10 mg total) by mouth daily. 05/01/22   Enedina Finner, MD  divalproex (DEPAKOTE ER) 500 MG 24 hr tablet Take 1 tablet (500 mg total) by mouth 2 (two) times daily. 05/24/19   Clapacs, Jackquline Denmark, MD  escitalopram (LEXAPRO) 10 MG tablet Take 1 tablet (10 mg total) by mouth at bedtime. 05/24/19   Clapacs, Jackquline Denmark, MD  ezetimibe (ZETIA) 10 MG tablet Take 1 tablet (10 mg total) by mouth daily. 04/01/22   Hollice Espy, MD  furosemide (LASIX) 20 MG tablet Take 3 tablets (60 mg total) by mouth 2 (two) times daily. 05/25/22   Alford Highland, MD  HYDROcodone-acetaminophen (NORCO/VICODIN) 5-325 MG tablet Take 1 tablet by mouth every 6 (six) hours as needed for severe pain. 05/25/22   Alford Highland, MD  Hydrocortisone Acetate 1 % CREA Apply TID 05/15/22   [provider]  hydrOXYzine (ATARAX) 10 MG tablet Take 10 mg by mouth 2 (two) times daily. 12/24/21   [provider]  isosorbide mononitrate (IMDUR) 30 MG 24 hr tablet Take 0.5 tablets (15 mg total) by mouth daily. 05/26/22   Alford Highland, MD  levETIRAcetam (KEPPRA) 250 MG tablet Take 1 tablet (250 mg total) by mouth 2 (two) times daily. 03/24/20   Minna Antis, MD  levothyroxine (SYNTHROID) 25 MCG tablet Take 1 tablet (25 mcg total) by mouth daily. Patient taking differently: Take 50 mcg by mouth daily. 05/24/19   Clapacs, Jackquline Denmark, MD  losartan (COZAAR) 25 MG tablet Take 0.5 tablets (12.5 mg total) by mouth daily. 05/01/22   Enedina Finner, MD  metoprolol succinate (TOPROL-XL) 25 MG 24 hr tablet Take 0.5 tablets (12.5 mg total) by mouth at bedtime. 04/30/22   Enedina Finner, MD  Multiple Vitamin (MULTIVITAMIN WITH MINERALS) TABS tablet Take 1 tablet by mouth daily. 05/01/22   Enedina Finner, MD  nitroGLYCERIN (NITROSTAT) 0.4 MG SL tablet Place 1 tablet (0.4 mg total) under the tongue every 5 (five) minutes as needed for chest pain. 05/25/22   Alford Highland, MD  omeprazole (PRILOSEC) 20 MG capsule Take 20 mg by mouth  daily.    [provider]  potassium chloride SA (KLOR-CON M) 20 MEQ tablet Take 2 tablets (40 mEq total) by mouth daily. 06/14/22   Furth, Cadence H, PA-C  QUEtiapine (SEROQUEL) 200 MG tablet Take 200 mg by mouth at bedtime.    [provider]  rosuvastatin (CRESTOR) 10 MG tablet Take 1 tablet (10 mg total) by mouth daily. 05/16/22   Delma Freeze, FNP  spironolactone (ALDACTONE) 25 MG tablet Take 1 tablet (25 mg total) by mouth daily. 05/26/22   Alford Highland, MD  tiotropium (SPIRIVA) 18 MCG inhalation capsule Place 18 mcg into inhaler and inhale daily.    [provider]  traMADol (ULTRAM) 50 MG tablet Take 50 mg by mouth every 12 (twelve) hours as needed for moderate pain.    [provider]  Inpatient Medications: Scheduled Meds:  apixaban  5 mg Oral BID   divalproex  500 mg Oral BID   escitalopram  10 mg Oral QHS   hydrOXYzine  10 mg Oral BID   insulin aspart  0-5 Units Subcutaneous QHS   insulin aspart  0-6 Units Subcutaneous TID WC   isosorbide mononitrate  15 mg Oral Daily   levETIRAcetam  250 mg Oral BID   levothyroxine  50 mcg Oral Daily   metoprolol succinate  12.5 mg Oral QHS   pantoprazole  40 mg Oral Daily   QUEtiapine  200 mg Oral QHS   rosuvastatin  10 mg Oral Daily   sodium chloride flush  3 mL Intravenous Q12H   umeclidinium bromide  1 puff Inhalation Daily   Continuous Infusions:  amiodarone 30 mg/hr (08/04/22 0756)   lactated ringers     PRN Meds: acetaminophen **OR** acetaminophen, albuterol, nitroGLYCERIN, oxyCODONE  Allergies:    Allergies  Allergen Reactions   Iodinated Contrast Media Shortness Of Breath   Penicillins Anaphylaxis and Shortness Of Breath    Respiratory  Tolerated cefuroxime on 01/06/16   Strawberry Extract Anaphylaxis   Cefepime Itching    Empiric antibiotic, developed pruritis.    Erythromycin Itching   Sulfa Antibiotics Itching, Nausea And Vomiting and Nausea Only    Social History:    Social History   Socioeconomic History   Marital status: Widowed    Spouse name: Not on file   Number of children: Not on file   Years of education: Not on file   Highest education level: Not on file  Occupational History   Not on file  Tobacco Use   Smoking status: Every Day    Packs/day: 0.25    Years: 0.00    Total pack years: 0.00    Types: Cigarettes   Smokeless tobacco: Never  Vaping Use   Vaping Use: Never used  Substance and Sexual Activity   Alcohol use: Not Currently   Drug use: Never   Sexual activity: Not Currently    Birth control/protection: Abstinence  Other Topics Concern   Not on file  Social History Narrative   Not on file   Social Determinants of Health   Financial Resource Strain: Not on file  Food Insecurity: No Food Insecurity (05/23/2022)   Hunger Vital Sign    Worried About Running Out of Food in the Last Year: Never true    Ran Out of Food in the Last Year: Never true  Transportation Needs: No Transportation Needs (05/23/2022)   PRAPARE - Administrator, Civil Service (Medical): No    Lack of Transportation (Non-Medical): No  Physical Activity: Not on file  Stress: Not on file  Social Connections: Not on file  Intimate Partner Violence: Not At Risk (05/23/2022)   Humiliation, Afraid, Rape, and Kick questionnaire    Fear of Current or Ex-Partner: No    Emotionally Abused: No    Physically Abused: No    Sexually Abused: No    Family History:    Family History  Problem Relation Age of Onset   Cancer Mother        "femal cancer"   Prostate cancer Father      ROS:  Please see the history of present illness.   All other ROS reviewed and negative.     Physical Exam/Data:   Vitals:   08/04/22 0630 08/04/22 0732 08/04/22 0830 08/04/22 0935  BP: 108/72  113/69 107/66  Pulse: 79  65 65  Resp: 20  20 16   Temp:  98 F (36.7 C)    TempSrc:  Oral    SpO2: 97%  99% 100%    Intake/Output Summary (Last 24 hours) at  08/04/2022 1000 Last data filed at 08/04/2022 2993 Gross per 24 hour  Intake --  Output 375 ml  Net -375 ml      08/03/2022   10:16 PM 07/31/2022   10:03 AM 06/14/2022   11:24 AM  Last 3 Weights  Weight (lbs) 187 lb 187 lb 4 oz 183 lb  Weight (kg) 84.823 kg 84.936 kg 83.008 kg     There is no height or weight on file to calculate BMI.  General:  Well nourished, well developed, in no acute distress HEENT: normal Neck: no JVD Vascular: No carotid bruits; Distal pulses 2+ bilaterally Cardiac:  normal S1, S2; RRR; no murmur  Lungs:  clear to auscultation bilaterally, no wheezing, rhonchi or rales  Abd: soft, nontender, no hepatomegaly  Ext: no edema Musculoskeletal:  No deformities, BUE and BLE strength normal and equal Skin: warm and dry  Neuro:  CNs 2-12 intact, no focal abnormalities noted Psych:  Normal affect   EKG:  The EKG was personally reviewed and demonstrates:  sinus rhythm Telemetry:  Telemetry was personally reviewed and demonstrates:  VT  Relevant CV Studies: TTE 02/16/22  1. Left ventricular ejection fraction, by estimation, is 25 to 30%. The  left ventricle has severely decreased function. The left ventricle  demonstrates regional wall motion abnormalities (see scoring  diagram/findings for description). The left  ventricular internal cavity size was moderately dilated. There is mild  concentric left ventricular hypertrophy. Left ventricular diastolic  parameters are indeterminate. Elevated left ventricular end-diastolic  pressure.   2. Right ventricular systolic function is normal. The right ventricular  size is normal.   3. Left atrial size was severely dilated.   4. Right atrial size was mildly dilated.   5. The mitral valve is normal in structure. Mild mitral valve  regurgitation. No evidence of mitral stenosis.   6. The aortic valve is tricuspid. Aortic valve regurgitation is mild. No  aortic stenosis is present.   7. The inferior vena cava is normal in  size with <50% respiratory  variability, suggesting right atrial pressure of 8 mmHg.   Laboratory Data:  High Sensitivity Troponin:   Recent Labs  Lab 08/03/22 1935 08/03/22 2137 08/04/22 0133 08/04/22 0424  TROPONINIHS 88* 144* 355* 393*     Chemistry Recent Labs  Lab 08/03/22 1935 08/04/22 0546  NA 129* 131*  K 4.0 4.7  CL 93* 97*  CO2 23 22  GLUCOSE 193* 104*  BUN 34* 32*  CREATININE 2.28* 2.11*  CALCIUM 9.1 8.9  MG 2.4 2.4  GFRNONAA 31* 35*  ANIONGAP 13 12    Recent Labs  Lab 08/03/22 1935  PROT 7.8  ALBUMIN 3.6  AST 35  ALT 21  ALKPHOS 60  BILITOT 0.7   Lipids No results for input(s): "CHOL", "TRIG", "HDL", "LABVLDL", "LDLCALC", "CHOLHDL" in the last 168 hours.  Hematology Recent Labs  Lab 08/03/22 1935 08/04/22 0546  WBC 8.2 9.2  RBC 4.88 4.46  HGB 14.2 13.2  HCT 42.6 38.5*  MCV 87.3 86.3  MCH 29.1 29.6  MCHC 33.3 34.3  RDW 17.1* 17.2*  PLT 223 217   Thyroid No results for input(s): "TSH", "FREET4" in the last 168 hours.  BNP Recent Labs  Lab 08/03/22 1935 08/04/22 0424  BNP 715.9*  681.9*    DDimer No results for input(s): "DDIMER" in the last 168 hours.   Radiology/Studies:  US RENAL  Result Date: 08-31-22 CLINICAL DATA:  Acute renal failure EXAM: RENAL / URINARY TRACT ULTRASOUND COMPLETE COMPARISON:  CT scan 03/14/2022 FINDINGS: Right Kidney: Renal measurements: 9.7 x 5.2 x 5.5 cm = volume: 145 mL. Echogenicity within normal limits. No mass or hydronephrosis visualized. Left Kidney: Renal measurements: 11.6 x 5.4 x 4.5 cm = volume: 148 mL. 15 mm cyst identified towards the upper pole, similar to prior CT. 13 mm interpolar subcapsular cyst evident, also present on prior CT although more subtle. No hydronephrosis. Bladder: Appears normal for degree of bladder distention. Other: Liver parenchyma appears echogenic suggesting fatty deposition. IMPRESSION: No hydronephrosis. Sonographic features suggesting hepatic steatosis. Electronically  Signed   By: Kennith Center M.D.   On: 08-31-22 06:49   DG Chest Portable 1 View  Result Date: 08-31-2022 CLINICAL DATA:  Chest pain and tachycardia EXAM: PORTABLE CHEST 1 VIEW COMPARISON:  05/22/2022 FINDINGS: Cardiac shadow is enlarged but stable. Postsurgical changes are seen. Defibrillator is again noted. Lungs are well aerated without focal infiltrate or sizable effusion. No acute bony abnormality is seen. Prior fixation of the left clavicle is again seen. IMPRESSION: No acute abnormality noted. Electronically Signed   By: Alcide Clever M.D.   On: August 31, 2022 01:46   CUP PACEART REMOTE DEVICE CHECK  Result Date: 08/02/2022 Scheduled remote reviewed. Normal device function.  Optivol crossed threshold 1/17 and is ongoing Next remote 91 days. LA    Assessment and Plan:   Ventricular tachycardia storm: Had slow VT at a rate of 150, below his device detection.  He was smoking at the time VT started.  He has general immobility.  Recent cath with stable disease.  No plan for left heart catheterization currently.  Leiyah Maultsby continue IV amiodarone.  As he does have coronary artery disease, we Glendel Jaggers plan for mexiletine to 50 mg twice daily.  This is a recurrent episode of VT from a few months ago. Chronic systolic heart failure: No obvious volume overload.  BNP only mildly elevated.  Getting IV hydration per internal medicine.  If creatinine continues to be elevated, Nikcole Eischeid reassess. Acute on chronic renal failure: CKD stage IIIa.  Plan for IV fluids as above.  No obvious volume overload. Coronary artery disease: No current chest pain.  Troponin without a trend suggesting MI.  Jarick Harkins hold off on left heart catheterization. Paroxysmal atrial fibrillation: Continue Eliquis.  In sinus rhythm.   For questions or updates, please contact [facility] Please consult [email or web address] for contact info under    Signed, Kasha Howeth Jorja Loa, MD  2022/08/31 10:00 AM

## 2022-08-04 NOTE — ED Notes (Signed)
Legal guardian called and notified of transfer to [facility] ED. Verbal consent obtained. Stated she would alert social work team.

## 2022-08-04 NOTE — ED Notes (Signed)
Pt condom catheter was removed and linens were soiled. RN changed linens and gown. Pt requested urinal which was provided by RN.    RN setup dinner tray and provided beverage

## 2022-08-04 NOTE — ED Notes (Signed)
RN adjusted pt sheets in bed

## 2022-08-04 NOTE — ED Triage Notes (Signed)
Pt bib [facility] from [facility] for cardiology consult. Pt from facility, reported 9/10 chest pain, found to be in vtach, given amio by ems with no conversion. Sync Cardioverted at [facility] with success, now in paced rhythm. Medtronic pacemaker.

## 2022-08-04 NOTE — ED Notes (Signed)
Patient did not answer/Carl [name]/03:45

## 2022-08-05 ENCOUNTER — Inpatient Hospital Stay (HOSPITAL_COMMUNITY): Payer: Medicaid Other

## 2022-08-05 DIAGNOSIS — I472 Ventricular tachycardia, unspecified: Secondary | ICD-10-CM | POA: Diagnosis not present

## 2022-08-05 LAB — BASIC METABOLIC PANEL
Anion gap: 9 (ref 5–15)
BUN: 26 mg/dL — ABNORMAL HIGH (ref 8–23)
CO2: 23 mmol/L (ref 22–32)
Calcium: 8.9 mg/dL (ref 8.9–10.3)
Chloride: 98 mmol/L (ref 98–111)
Creatinine, Ser: 1.99 mg/dL — ABNORMAL HIGH (ref 0.61–1.24)
GFR, Estimated: 37 mL/min — ABNORMAL LOW (ref >60)
Glucose, Bld: 167 mg/dL — ABNORMAL HIGH (ref 70–99)
Potassium: 4.6 mmol/L (ref 3.5–5.1)
Sodium: 130 mmol/L — ABNORMAL LOW (ref 135–145)

## 2022-08-05 LAB — CBC WITH DIFFERENTIAL/PLATELET
Abs Immature Granulocytes: 0.08 10*3/uL — ABNORMAL HIGH (ref 0.00–0.07)
Basophils Absolute: 0.1 10*3/uL (ref 0.0–0.1)
Basophils Relative: 1 %
Eosinophils Absolute: 0.2 10*3/uL (ref 0.0–0.5)
Eosinophils Relative: 1 %
HCT: 36.4 % — ABNORMAL LOW (ref 39.0–52.0)
Hemoglobin: 12.4 g/dL — ABNORMAL LOW (ref 13.0–17.0)
Immature Granulocytes: 1 %
Lymphocytes Relative: 11 %
Lymphs Abs: 1.2 10*3/uL (ref 0.7–4.0)
MCH: 29.5 pg (ref 26.0–34.0)
MCHC: 34.1 g/dL (ref 30.0–36.0)
MCV: 86.5 fL (ref 80.0–100.0)
Monocytes Absolute: 1.1 10*3/uL — ABNORMAL HIGH (ref 0.1–1.0)
Monocytes Relative: 10 %
Neutro Abs: 8.3 10*3/uL — ABNORMAL HIGH (ref 1.7–7.7)
Neutrophils Relative %: 76 %
Platelets: 199 10*3/uL (ref 150–400)
RBC: 4.21 MIL/uL — ABNORMAL LOW (ref 4.22–5.81)
RDW: 17.1 % — ABNORMAL HIGH (ref 11.5–15.5)
WBC: 10.9 10*3/uL — ABNORMAL HIGH (ref 4.0–10.5)
nRBC: 0 % (ref 0.0–0.2)

## 2022-08-05 LAB — BRAIN NATRIURETIC PEPTIDE: B Natriuretic Peptide: 941.5 pg/mL — ABNORMAL HIGH (ref 0.0–100.0)

## 2022-08-05 LAB — MAGNESIUM: Magnesium: 2.4 mg/dL (ref 1.7–2.4)

## 2022-08-05 LAB — GLUCOSE, CAPILLARY
Glucose-Capillary: 123 mg/dL — ABNORMAL HIGH (ref 70–99)
Glucose-Capillary: 115 mg/dL — ABNORMAL HIGH (ref 70–99)
Glucose-Capillary: 126 mg/dL — ABNORMAL HIGH (ref 70–99)
Glucose-Capillary: 125 mg/dL — ABNORMAL HIGH (ref 70–99)

## 2022-08-05 MED ORDER — FUROSEMIDE 10 MG/ML IJ SOLN
20.0000 mg | Freq: Once | INTRAMUSCULAR | Status: AC
  Administered 2022-08-05: 20 mg via INTRAVENOUS
  Filled 2022-08-05: qty 2

## 2022-08-05 MED ORDER — METOPROLOL SUCCINATE ER 25 MG PO TB24
12.5000 mg | ORAL_TABLET | Freq: Every day | ORAL | Status: DC
  Administered 2022-08-06 – 2022-08-07 (×2): 12.5 mg via ORAL
  Filled 2022-08-05 (×2): qty 1

## 2022-08-05 MED ORDER — ISOSORBIDE MONONITRATE ER 30 MG PO TB24: 15.0000 mg | ORAL_TABLET | Freq: Every day | ORAL | Status: DC

## 2022-08-05 MED ORDER — FUROSEMIDE 10 MG/ML IJ SOLN
60.0000 mg | Freq: Once | INTRAMUSCULAR | Status: AC
  Administered 2022-08-05: 60 mg via INTRAVENOUS
  Filled 2022-08-05: qty 6

## 2022-08-05 MED ORDER — FUROSEMIDE 10 MG/ML IJ SOLN: 40.0000 mg | Freq: Once | INTRAMUSCULAR | Status: DC

## 2022-08-05 MED ORDER — ISOSORBIDE MONONITRATE ER 30 MG PO TB24
15.0000 mg | ORAL_TABLET | Freq: Every day | ORAL | Status: DC
  Administered 2022-08-05 – 2022-08-08 (×4): 15 mg via ORAL
  Filled 2022-08-05 (×4): qty 1

## 2022-08-05 MED ORDER — AMIODARONE HCL 200 MG PO TABS
400.0000 mg | ORAL_TABLET | Freq: Two times a day (BID) | ORAL | Status: DC
  Administered 2022-08-05 – 2022-08-08 (×6): 400 mg via ORAL
  Filled 2022-08-05 (×6): qty 2

## 2022-08-05 MED ORDER — SPIRONOLACTONE 12.5 MG HALF TABLET
12.5000 mg | ORAL_TABLET | Freq: Once | ORAL | Status: AC
  Administered 2022-08-05: 12.5 mg via ORAL
  Filled 2022-08-05: qty 1

## 2022-08-05 NOTE — Progress Notes (Addendum)
Electrophysiology Rounding Note  Patient Name: Carl [name] Date of Encounter: 08/05/2022  Primary Cardiologist: Kathlyn Sacramento, MD Electrophysiologist: Virl Axe, MD   Subjective   The patient is doing well today.  At this time, the patient denies chest pain, shortness of breath, or any new concerns.  Inpatient Medications    Scheduled Meds:  apixaban  5 mg Oral BID   divalproex  500 mg Oral BID   escitalopram  10 mg Oral QHS   hydrOXYzine  10 mg Oral BID   insulin aspart  0-5 Units Subcutaneous QHS   insulin aspart  0-6 Units Subcutaneous TID WC   [START ON 08/06/2022] isosorbide mononitrate  15 mg Oral Daily   levETIRAcetam  250 mg Oral BID   levothyroxine  50 mcg Oral Daily   [START ON 08/06/2022] metoprolol succinate  12.5 mg Oral QHS   mexiletine  250 mg Oral Q12H   pantoprazole  40 mg Oral Daily   QUEtiapine  200 mg Oral QHS   rosuvastatin  10 mg Oral Daily   sodium chloride flush  3 mL Intravenous Q12H   umeclidinium bromide  1 puff Inhalation Daily   Continuous Infusions:  amiodarone 30 mg/hr (08/04/22 1849)   PRN Meds: acetaminophen **OR** acetaminophen, albuterol, nitroGLYCERIN, oxyCODONE   Vital Signs    Vitals:   08/04/22 2006 08/04/22 2100 08/05/22 0004 08/05/22 0600  BP: 109/67  107/66 97/78  Pulse:    73  Resp:    19  Temp: 97.7 F (36.5 C)  98.2 F (36.8 C) 98.2 F (36.8 C)  TempSrc: Oral  Oral Oral  SpO2:  91%  95%  Weight:   84.2 kg 84.2 kg  Height:        Intake/Output Summary (Last 24 hours) at 08/05/2022 0738 Last data filed at 08/05/2022 0602 Gross per 24 hour  Intake 747.51 ml  Output 975 ml  Net -227.49 ml   Filed Weights   08/04/22 1841 08/05/22 0004 08/05/22 0600  Weight: 84.3 kg 84.2 kg 84.2 kg    Physical Exam    GEN- The patient is well appearing, alert and oriented x 3 today.   HEENT- No gross abnormality.  Lungs- Clear to ausculation bilaterally, normal work of breathing Heart- Regular rate and rhythm, no  murmurs, rubs or gallops GI- soft, NT, ND, + BS Extremities- no clubbing or cyanosis. No edema Neuro- No obvious focal abnormality.   Telemetry    NSR 70s (personally reviewed)   Patient Profile     Carl [name] is a 62 y.o. male with a hx of chronic systolic heart failure, ventricular tachycardia, bipolar disease, CKD who is being seen 08/04/2022 for the evaluation of VT at the request of [name].   Assessment & Plan    Ventricular tachycardia storm Continue IV amiodarone, likely through today.  Continue mexitil 250 mg BID. Potassium4.6 (01/29 0042) Magnesium  2.4 (01/29 0042) Creatinine, ser  1.99* (01/29 0042) Keep K > 4.0 and Mg > 2.0   2. Chronic systolic heart failure: Echo 08/04/2022 LVEF 25-30% Volume status looks OK.  Watch carefully with IVF for AKI.   3. Acute on chronic renal failure: CKD stage IIIa.   Plan for IV fluids as above.  No obvious volume overload. Cr 2.28 -> 2.11 -> 1.99  4. Coronary artery disease: Denies chest pain.  Recent cath 04/2022 with stable disease, no troponin trend this admission.   5. Paroxysmal atrial fibrillation: Maintaining NSR Continue Eliquis.   For questions or  updates, please contact [place] Please consult [email or web address] for contact info under Cardiology/STEMI.  Signed, [name], PA-C  08/05/2022, 7:38 AM   I have seen and examined this patient with [name].  Agree with above, note added to reflect my findings.  No further ventricular arrhythmias.  Mildly confused.  Otherwise no acute complaint.  GEN: Well nourished, well developed, in no acute distress  HEENT: normal  Neck: no JVD, carotid bruits, or masses Cardiac: RRR; no murmurs, rubs, or gallops,no edema  Respiratory:  clear to auscultation bilaterally, normal work of breathing GI: soft, nontender, nondistended, + BS MS: no deformity or atrophy  Skin: warm and dry, device site well healed Neuro:  Strength and sensation are  intact Psych: euthymic mood, full affect   Ventricular tachycardia: Continue IV amiodarone.  Mexiletine has been started.  Important to keep potassium and magnesium within normal limits.  I have adjusted VT zones to encompass his slow VT at 153 bpm with ATP and shocks if necessary. Chronic systolic heart failure: EF 25 to 30%.  No evidence of volume overload. Acute on chronic renal failure, CKD stage IIIa.  Getting IV fluids.  Creatinine has been improving. Coronary artery disease: No current chest pain. Paroxysmal atrial fibrillation: Maintaining sinus rhythm.  Continue Eliquis.  Wilton Thrall M. Colie Josten MD 08/05/2022 8:48 AM

## 2022-08-05 NOTE — Progress Notes (Addendum)
PROGRESS NOTE                                                                                                                                                                                                             Patient Demographics:    Carl [name], is a 62 y.o. male, DOB - 03-02-1961, XAJ:[id number]  Outpatient Primary MD for the patient is [facility]    LOS - 1  Admit date - 08/04/2022    Chief Complaint  Patient presents with   Chest Pain       Brief Narrative (HPI from H&P)   62 y.o. male with medical history significant for hypertension, type 2 diabetes mellitus, bipolar disorder, CAD status post CABG, history of seizure, history of stroke, chronic systolic CHF EF 30% has biventricular pacemaker with AICD and atrial fibrillation on Eliquis who presents emergency department for evaluation of ventricular tachycardia.  He lives at an SNF where he went out to smoke a cigarette on the day of admission and developed some chest discomfort when EMS arrived he was found to be in V. tach and brought to the ER.   Subjective:    Carl [name] today has, No headache, No chest pain, No abdominal pain - No Nausea, No new weakness tingling or numbness, no SOB.   Assessment  & Plan :    1. Ventricular tachycardia in a patient with chronic ischemic cardiomyopathy with chronic systolic heart failure EF around 30% has biventricular pacemaker/AICD device in place - Severe MR.  He is chronically on oral amiodarone, seen by cardiology in the ER and placed on amiodarone drip + Mexitil, currently blood pressure low and in AKI hence cannot do beta-blocker, ACE/ARB or Entresto.  Clinically appears dehydrated.  Defer management of V. tach and chest pain to cardiology team.  Currently chest pain-free and symptom-free, stable electrolytes.  Repeat echocardiogram noted.  2. AKI superimposed on CKD IIIa  - - SCr is 2.28 on  admission, slight creatinine is under 1.5 appears dehydrated, urine electrolytes ordered, hold diuretics, hold blood pressure medications again today as still SBP < 90  3. Chronic HFrEF 30% on recent echocardiogram.  At this biventricular pacemaker.  Plan as in #1 above. - Appears compensated  - Hold diuretics and ARB initially in setting of AKI, monitor weight and I/Os   3pm -  called pt has some SOB, few rales, acute on chr S.CHF - Lasix, Bipap, monitor.  6.30 pm - Ur outpt 1 lit, resting comfortable, BiPAP for another 2 hrs, Lasix 20mg  repeat, re eval at 8pm with soft diet and trial of [place] o2, DW RN.     4. PAF with vas 2 score of greater than 3. - Continue Eliqius and Toprol as tolerated by blood pressure   5. Elevated troponin; CAD - No anginal complaints, troponin trend is flat and in non-ACS pattern.  Continue statin for secondary prevention, beta-blocker if tolerated by blood pressure, on Eliquis, cardiology on board.  Chest pain-free.    6. Bipolar disorder  - Continue Lexapro, Atarax, and Seroquel     7. Hx of seizures  - Continue Depakote and Keppra     8.  Hypothyroidism  - Continue Synthroid   9. Type II DM  - A1c was 6.6% in September 2023  - Check CBGs and use low-intensity SSI for now   CBG (last 3)  Recent Labs    08/04/22 1152 08/04/22 2102 08/05/22 0559  GLUCAP 114* 163* 123*          Condition - Extremely Guarded  Family Communication  :  None  Code Status :  Full  Consults  :  Cards  PUD Prophylaxis :    Procedures  :     TTE -  1. Left ventricular ejection fraction, by estimation, is 25 to 30%. The left ventricle has severely decreased function. The left ventricle demonstrates regional wall motion abnormalities (see scoring diagram/findings for description). The left ventricular internal cavity size was moderately dilated. Left ventricular diastolic function could not be evaluated.  2. Right ventricular systolic function is normal. The  right ventricular size is normal. There is normal pulmonary artery systolic pressure. The estimated right ventricular systolic pressure is 31.9 mmHg.  3. Left atrial size was severely dilated.  4. The mitral valve has been repaired/replaced. Severe mitral valve regurgitation. Mild mitral stenosis. The mean mitral valve gradient is 5.5 mmHg with average heart rate of 66 bpm.  5. The aortic valve is tricuspid. There is mild thickening of the aortic valve. Aortic valve regurgitation is not visualized. Aortic valve sclerosis is present, with no evidence of aortic valve stenosis.  6. The inferior vena cava is normal in size with greater than 50% respiratory variability, suggesting right atrial pressure of 3 mmHg.      Disposition Plan  :    Status is: Inpatient   DVT Prophylaxis  :     apixaban (ELIQUIS) tablet 5 mg     Lab Results  Component Value Date   PLT 199 08/05/2022    Diet :  Diet Order             Diet regular Room service appropriate? Yes; Fluid consistency: Thin  Diet effective now                    Inpatient Medications  Scheduled Meds:  apixaban  5 mg Oral BID   divalproex  500 mg Oral BID   escitalopram  10 mg Oral QHS   hydrOXYzine  10 mg Oral BID   insulin aspart  0-5 Units Subcutaneous QHS   insulin aspart  0-6 Units Subcutaneous TID WC   [START ON 08/06/2022] isosorbide mononitrate  15 mg Oral Daily   levETIRAcetam  250 mg Oral BID   levothyroxine  50 mcg Oral Daily   [START ON  08/06/2022] metoprolol succinate  12.5 mg Oral QHS   mexiletine  250 mg Oral Q12H   pantoprazole  40 mg Oral Daily   QUEtiapine  200 mg Oral QHS   rosuvastatin  10 mg Oral Daily   sodium chloride flush  3 mL Intravenous Q12H   umeclidinium bromide  1 puff Inhalation Daily   Continuous Infusions:  amiodarone 30 mg/hr (08/05/22 0751)   PRN Meds:.acetaminophen **OR** acetaminophen, albuterol, nitroGLYCERIN, oxyCODONE  Antibiotics  :    Anti-infectives (From admission,  onward)    None         Objective:   Vitals:   08/04/22 2100 08/05/22 0004 08/05/22 0600 08/05/22 0753  BP:  107/66 97/78 114/81  Pulse:   73 76  Resp:   19 20  Temp:  98.2 F (36.8 C) 98.2 F (36.8 C) 99.1 F (37.3 C)  TempSrc:  Oral Oral Oral  SpO2: 91%  95% 96%  Weight:  84.2 kg 84.2 kg   Height:        Wt Readings from Last 3 Encounters:  08/05/22 84.2 kg  08/03/22 84.8 kg  07/31/22 84.9 kg     Intake/Output Summary (Last 24 hours) at 08/05/2022 4098 Last data filed at 08/05/2022 0800 Gross per 24 hour  Intake 1227.51 ml  Output 975 ml  Net 252.51 ml     Physical Exam  Awake Alert, No new F.N deficits, Normal affect [place].AT,PERRAL Supple Neck, No JVD,   Symmetrical Chest wall movement, Good air movement bilaterally, CTAB RRR,No Gallops, Rubs or new Murmurs,  +ve B.Sounds, Abd Soft, No tenderness,   No Cyanosis, Clubbing or edema       Data Review:    Recent Labs  Lab 08/03/22 1935 08/04/22 0546 08/05/22 0042  WBC 8.2 9.2 10.9*  HGB 14.2 13.2 12.4*  HCT 42.6 38.5* 36.4*  PLT 223 217 199  MCV 87.3 86.3 86.5  MCH 29.1 29.6 29.5  MCHC 33.3 34.3 34.1  RDW 17.1* 17.2* 17.1*  LYMPHSABS 2.3  --  1.2  MONOABS 0.4  --  1.1*  EOSABS 0.4  --  0.2  BASOSABS 0.1  --  0.1    Recent Labs  Lab 08/03/22 1935 08/04/22 0153 08/04/22 0424 08/04/22 0546 08/05/22 0042  NA 129*  --   --  131* 130*  K 4.0  --   --  4.7 4.6  CL 93*  --   --  97* 98  CO2 23  --   --  22 23  ANIONGAP 13  --   --  12 9  GLUCOSE 193*  --   --  104* 167*  BUN 34*  --   --  32* 26*  CREATININE 2.28*  --   --  2.11* 1.99*  AST 35  --   --   --   --   ALT 21  --   --   --   --   ALKPHOS 60  --   --   --   --   BILITOT 0.7  --   --   --   --   ALBUMIN 3.6  --   --   --   --   INR  --  1.2  --   --   --   BNP 715.9*  --  681.9*  --  941.5*  MG 2.4  --   --  2.4 2.4  CALCIUM 9.1  --   --  8.9 8.9  Recent Labs  Lab 08/03/22 1935 08/04/22 0153 08/04/22 0424  08/04/22 0546 08/05/22 0042  INR  --  1.2  --   --   --   BNP 715.9*  --  681.9*  --  941.5*  MG 2.4  --   --  2.4 2.4  CALCIUM 9.1  --   --  8.9 8.9    Recent Labs  Lab 08/03/22 1935 08/04/22 0546 08/05/22 0042  WBC 8.2 9.2 10.9*  PLT 223 217 199  CREATININE 2.28* 2.11* 1.99*    ------------------------------------------------------------------------------------------------------------------ No results for input(s): "CHOL", "HDL", "LDLCALC", "TRIG", "CHOLHDL", "LDLDIRECT" in the last 72 hours.  Lab Results  Component Value Date   HGBA1C 6.6 (H) 03/25/2022    No results for input(s): "TSH", "T4TOTAL", "T3FREE", "THYROIDAB" in the last 72 hours.  Invalid input(s): "FREET3" ------------------------------------------------------------------------------------------------------------------ Cardiac Enzymes No results for input(s): "CKMB", "TROPONINI", "MYOGLOBIN" in the last 168 hours.  Invalid input(s): "CK"  Micro Results No results found for this or any previous visit (from the past 240 hour(s)).  Radiology Reports DG Chest Port 1 View  Result Date: 08/05/2022 CLINICAL DATA:  119147 with shortness of breath. EXAM: PORTABLE CHEST 1 VIEW COMPARISON:  Portable chest yesterday at 1:27 a.m. FINDINGS: 6:22 a.m. Left chest pacemaker/AID and wiring are stable. Overlying electrical pads are again noted. There are sternotomy sutures and mitral valve metal ring prosthesis. Moderate to severe cardiomegaly. Stable mediastinum with aortic calcific plaques. Today there is increased perihilar vascular congestion, and development of mild generalized interstitial edema throughout the lung fields with small layering pleural effusions. No acute airspace infiltrate is seen. The lungs are otherwise clear. A fracture fixation rod is again noted in the left clavicle with healed fracture deformity. IMPRESSION: Cardiomegaly with increased perihilar vascular congestion and development of mild generalized  interstitial edema and small layering pleural effusions. Findings consistent with CHF or fluid overload. Electronically Signed   By: Telford Nab M.D.   On: 08/05/2022 06:35   ECHOCARDIOGRAM COMPLETE  Result Date: 08/04/2022    ECHOCARDIOGRAM REPORT   [name] Date of Exam: 08/04/2022 Medical Rec #:  [id number]    Height:       69.0 in Accession #:    [id number]   Weight:       187.0 lb Date of Birth:  Dec 25, 1960    BSA:          2.008 m Patient Age:    75 years     BP:           114/71 mmHg Patient Gender: M            HR:           64 bpm. Exam Location:  Inpatient Procedure: 2D Echo Indications:    ventricular tachycardia  History:        Patient has prior history of Echocardiogram examinations, most                 recent 02/16/2022. Cardiomyopathy, CAD, Defibrillator, chronic                 kidney disease. and COPD, Arrythmias:Paroxysmal A-Fib; Risk                 Factors:Diabetes, Current Smoker and Dyslipidemia.  Sonographer:    Johny Chess RDCS Referring Phys: [id number] [place]  1. Left ventricular ejection fraction, by estimation, is 25 to 30%. The left ventricle has severely decreased function. The left  ventricle demonstrates regional wall motion abnormalities (see scoring diagram/findings for description). The left ventricular internal cavity size was moderately dilated. Left ventricular diastolic function could not be evaluated.  2. Right ventricular systolic function is normal. The right ventricular size is normal. There is normal pulmonary artery systolic pressure. The estimated right ventricular systolic pressure is 31.9 mmHg.  3. Left atrial size was severely dilated.  4. The mitral valve has been repaired/replaced. Severe mitral valve regurgitation. Mild mitral stenosis. The mean mitral valve gradient is 5.5 mmHg with average heart rate of 66 bpm.  5. The aortic valve is tricuspid. There is mild thickening of the aortic valve. Aortic valve  regurgitation is not visualized. Aortic valve sclerosis is present, with no evidence of aortic valve stenosis.  6. The inferior vena cava is normal in size with greater than 50% respiratory variability, suggesting right atrial pressure of 3 mmHg. Comparison(s): No significant change from prior study. Prior images reviewed side by side. Mitral insufficiency was underestimated on the previous report. FINDINGS  Left Ventricle: Left ventricular ejection fraction, by estimation, is 25 to 30%. The left ventricle has severely decreased function. The left ventricle demonstrates regional wall motion abnormalities. The left ventricular internal cavity size was moderately dilated. There is no left ventricular hypertrophy. Left ventricular diastolic function could not be evaluated due to mitral valve repair. Left ventricular diastolic function could not be evaluated.  LV Wall Scoring: The apical septal segment, apical inferior segment, and apex are dyskinetic. The mid and distal anterior wall, mid and distal lateral wall, posterior wall, and mid anterolateral segment are akinetic. The mid inferior segment is hypokinetic. The anterior septum, basal anterolateral segment, mid inferoseptal segment, basal anterior segment, basal inferior segment, and basal inferoseptal segment are normal. Right Ventricle: The right ventricular size is normal. No increase in right ventricular wall thickness. Right ventricular systolic function is normal. There is normal pulmonary artery systolic pressure. The tricuspid regurgitant velocity is 2.69 m/s, and  with an assumed right atrial pressure of 3 mmHg, the estimated right ventricular systolic pressure is 31.9 mmHg. Left Atrium: Left atrial size was severely dilated. Right Atrium: Right atrial size was normal in size. Pericardium: There is no evidence of pericardial effusion. Mitral Valve: The mitral valve has been repaired/replaced. Severe mitral valve regurgitation. There is a prosthetic  annuloplasty ring present in the mitral position. Mild mitral valve stenosis. MV peak gradient, 19.5 mmHg. The mean mitral valve gradient is 5.5 mmHg with average heart rate of 66 bpm. Tricuspid Valve: The tricuspid valve is normal in structure. Tricuspid valve regurgitation is trivial. Aortic Valve: The aortic valve is tricuspid. There is mild thickening of the aortic valve. Aortic valve regurgitation is not visualized. Aortic valve sclerosis is present, with no evidence of aortic valve stenosis. Pulmonic Valve: The pulmonic valve was normal in structure. Pulmonic valve regurgitation is not visualized. Aorta: The aortic root and ascending aorta are structurally normal, with no evidence of dilitation. Venous: The inferior vena cava is normal in size with greater than 50% respiratory variability, suggesting right atrial pressure of 3 mmHg. IAS/Shunts: No atrial level shunt detected by color flow Doppler. Additional Comments: A device lead is visualized in the right ventricle and right atrium.  LEFT VENTRICLE PLAX 2D LVIDd:         6.60 cm   Diastology LVIDs:         5.50 cm   LV e' medial:  4.57 cm/s LV PW:         1.10  cm   LV e' lateral: 4.13 cm/s LV IVS:        1.10 cm LVOT diam:     2.00 cm LV SV:         52 LV SV Index:   26 LVOT Area:     3.14 cm  RIGHT VENTRICLE          IVC RV Basal diam:  2.90 cm  IVC diam: 1.70 cm TAPSE (M-mode): 1.4 cm LEFT ATRIUM              Index        RIGHT ATRIUM           Index LA diam:        5.70 cm  2.84 cm/m   RA Area:     17.40 cm LA Vol (A2C):   112.0 ml 55.78 ml/m  RA Volume:   42.60 ml  21.22 ml/m LA Vol (A4C):   111.0 ml 55.28 ml/m LA Biplane Vol: 115.0 ml 57.28 ml/m  AORTIC VALVE LVOT Vmax:   76.50 cm/s LVOT Vmean:  47.500 cm/s LVOT VTI:    0.166 m  AORTA Ao Root diam: 3.20 cm Ao Asc diam:  3.50 cm MITRAL VALVE              TRICUSPID VALVE MV Area (PHT): 2.52 cm   TR Peak grad:   28.9 mmHg MV Area VTI:   0.97 cm   TR Vmax:        269.00 cm/s MV Peak grad:  19.5  mmHg MV Mean grad:  5.5 mmHg   SHUNTS MV Vmax:       2.21 m/s   Systemic VTI:  0.17 m MV Vmean:      99.9 cm/s  Systemic Diam: 2.00 cm MV Decel Time: 301 msec MR Peak grad: 64.6 mmHg MR Mean grad: 40.0 mmHg MR Vmax:      402.00 cm/s MR Vmean:     297.0 cm/s Rachelle Hora Croitoru MD Electronically signed by Thurmon Fair MD Signature Date/Time: 08/04/2022/4:38:32 PM    Final    US RENAL  Result Date: 08/04/2022 CLINICAL DATA:  Acute renal failure EXAM: RENAL / URINARY TRACT ULTRASOUND COMPLETE COMPARISON:  CT scan 03/14/2022 FINDINGS: Right Kidney: Renal measurements: 9.7 x 5.2 x 5.5 cm = volume: 145 mL. Echogenicity within normal limits. No mass or hydronephrosis visualized. Left Kidney: Renal measurements: 11.6 x 5.4 x 4.5 cm = volume: 148 mL. 15 mm cyst identified towards the upper pole, similar to prior CT. 13 mm interpolar subcapsular cyst evident, also present on prior CT although more subtle. No hydronephrosis. Bladder: Appears normal for degree of bladder distention. Other: Liver parenchyma appears echogenic suggesting fatty deposition. IMPRESSION: No hydronephrosis. Sonographic features suggesting hepatic steatosis. Electronically Signed   By: Kennith Center M.D.   On: 08/04/2022 06:49   DG Chest Portable 1 View  Result Date: 08/04/2022 CLINICAL DATA:  Chest pain and tachycardia EXAM: PORTABLE CHEST 1 VIEW COMPARISON:  05/22/2022 FINDINGS: Cardiac shadow is enlarged but stable. Postsurgical changes are seen. Defibrillator is again noted. Lungs are well aerated without focal infiltrate or sizable effusion. No acute bony abnormality is seen. Prior fixation of the left clavicle is again seen. IMPRESSION: No acute abnormality noted. Electronically Signed   By: Alcide Clever M.D.   On: 08/04/2022 01:46   CUP PACEART REMOTE DEVICE CHECK  Result Date: 08/02/2022 Scheduled remote reviewed. Normal device function.  Optivol crossed threshold 1/17 and is ongoing Next remote 91  days. LA     Signature  -   Susa Raring M.D on 08/05/2022 at 8:08 AM   -  To page go to [email or web address]

## 2022-08-05 NOTE — Progress Notes (Signed)
RT called to patient room to give PRN nebulizer treatment due to patient's increased work of breathing. Upon arrival, patient had audible wheezing,RR in the 40s, sats of 90-92% on 3L [place], and accessory muscle use.  Gave patient PRN Albuterol which did not help.  MD was paged and orders were placed for patient to be given Lasix and placed on bipap.  Patient is still noted to be tachypneic but tolerating bipap well and states to RT that it feels better.  Will continue to monitor.

## 2022-08-05 NOTE — TOC Progression Note (Signed)
Transition of Care [facility]) - Progression Note    Patient Details  Name: Carl [name] MRN: [id number] Date of Birth: December 18, 1960  [facility] CM/SW Contact  Zenon Mayo, RN Phone Number: 08/05/2022, 8:32 AM  Clinical Narrative:    [name], he has a legal [name] , [name] [phone].  Per last admit, the ALF facility provided transport  by private vehicle (someone name Madelynn Done).  [employer] supplied him a w/chair also on last admit.  This admit he presents with vtach, chronic sys HF, AKI, PAF, Elevated trops, Bipolar d/o.  Conts on amio drip.  TOC following.        Expected Discharge Plan and Services                                               Social Determinants of Health (SDOH) Interventions SDOH Screenings   Food Insecurity: No Food Insecurity (05/23/2022)  Housing: Low Risk  (05/23/2022)  Transportation Needs: No Transportation Needs (05/23/2022)  Utilities: Not At Risk (05/23/2022)  Alcohol Screen: Low Risk  (05/15/2019)  Tobacco Use: High Risk (08/04/2022)    Readmission Risk Interventions    03/15/2022    2:44 PM  Readmission Risk Prevention Plan  HRI or [place] Complete  Social Work Consult for [place] Planning/Counseling Complete  Palliative Care Screening Not Applicable  Medication Review [employer]) Complete

## 2022-08-05 NOTE — Progress Notes (Signed)
   08/05/22 1451  Vitals  BP 121/62  MAP (mmHg) 81  BP Location Right Arm  BP Method Automatic  Patient Position (if appropriate) Lying  Pulse Rate 81  Pulse Rate Source Monitor  ECG Heart Rate 82  Resp (!) 28  MEWS COLOR  MEWS Score Color Yellow  Oxygen Therapy  SpO2 91 %  O2 Device Bi-PAP  FiO2 (%) 50 %  MEWS Score  MEWS Temp 0  MEWS Systolic 0  MEWS Pulse 0  MEWS RR 2  MEWS LOC 0  MEWS Score 2    Patient is currently yellow MEWS due to increase RR. Patient is labored with RR between 30-40. RT at bedside, given prn nebulizer with no relief. [facility] RN called. RT notified MD. RN gave dose of IV lasix and RT placed BIPAP on patient.

## 2022-08-05 NOTE — TOC Initial Note (Signed)
Transition of Care [facility]) - Initial/Assessment Note    Patient Details  Name: Carl [name] MRN: [id number] Date of Birth: 12-12-1960  Transition of Care [facility]) CM/SW Contact:    Zenon Mayo, RN Phone Number: 08/05/2022, 8:40 AM  Clinical Narrative:                 [name], he has a legal Muse [phone].  Per last admit, the ALF facility provided transport  by private vehicle (someone name Madelynn Done).  [employer] supplied him a w/chair also on last admit.  This admit he presents with vtach, chronic sys HF, AKI, PAF, Elevated trops, Bipolar d/o.  Conts on amio drip.  TOC following.    Expected Discharge Plan: Assisted Living Barriers to Discharge: Continued Medical Work up   Patient Goals and CMS Choice Patient states their goals for this hospitalization and ongoing recovery are:: return to ALF   Choice offered to / list presented to : NA      Expected Discharge Plan and Services In-house Referral: NA Discharge Planning Services: CM Consult Post Acute Care Choice: NA Living arrangements for the past 2 months: Assisted Living Facility                 DME Arranged: N/A DME Agency: NA       HH Arranged: NA          Prior Living Arrangements/Services Living arrangements for the past 2 months: [place] Lives with:: Facility Resident   Do you feel safe going back to the place where you live?: Yes      Need for Family Participation in Patient Care: Yes (Comment) Care giver support system in place?: Yes (comment) Current home services: DME (w/chair, walker, nebs) Criminal Activity/Legal Involvement Pertinent to Current Situation/Hospitalization: No - Comment as needed  Activities of Daily Living      Permission Sought/Granted                  Emotional Assessment   Attitude/Demeanor/Rapport:  (appropriate) Affect (typically observed): Appropriate Orientation: : Oriented to Self, Oriented to  Time, Oriented to  Place Alcohol / Substance Use: Not Applicable Psych Involvement: No (comment)  Admission diagnosis:  Ventricular tachycardia ([place]) [I47.20] V tach ([place]) [I47.20] AKI (acute kidney injury) ([place]) [N17.9] Patient Active Problem List   Diagnosis Date Noted   AICD discharge 04/27/2022   Non-ST elevation (NSTEMI) myocardial infarction [facility])    Ventricular fibrillation ([place])    Ventricular tachycardia ([place]) 04/23/2022   Acute respiratory failure with hypoxia ([place]) 04/22/2022   Hypokalemia 04/22/2022   Demand ischemia    Type 2 diabetes mellitus with hyperglycemia ([place]) 03/25/2022   Overweight (BMI 25.0-29.9) 03/23/2022   COVID-19 virus infection 03/23/2022   Respiratory failure ([place]) 03/22/2022   Dilated cardiomyopathy ([place])    Chest pain    COPD exacerbation ([place]) 03/14/2022   Seizure disorder ([place]) 03/14/2022   Dyspnea    Congestive heart failure ([place]) 02/15/2022   Anxiety 02/15/2022   Tobacco use 02/15/2022   Elevated partial thromboplastin time (PTT) 02/15/2022   Abdominal pain 02/15/2022   HFrEF (heart failure with reduced ejection fraction) ([place])    Weakness    COPD with acute exacerbation ([place]) 05/23/2021   Paroxysmal atrial fibrillation ([place]) 05/23/2021   Chronic anticoagulation 05/23/2021   Abdominal aortic aneurysm (AAA) 35 to 39 mm in diameter ([place]) 05/23/2021   Ischemic cardiomyopathy 05/22/2020   Chronic combined systolic and diastolic heart failure ([place]) 05/22/2020  HCAP (healthcare-associated pneumonia) 01/24/2020   Hyperkalemia 01/24/2020   Acute on chronic combined systolic and diastolic CHF (congestive heart failure) ([place]) 01/24/2020   History of seizure 01/24/2020   HLD (hyperlipidemia) 01/24/2020   Ascites due to alcoholic cirrhosis ([place])    Acute on chronic HFrEF (heart failure with reduced ejection fraction) ([place]) 11/18/2019   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/18/2019   Elevated troponin 11/18/2019   Hypoxia 11/18/2019   Acute renal  failure superimposed on stage 3a chronic kidney disease ([place]) 11/18/2019   CAD in native artery 05/15/2019   History of stroke 05/15/2019   Homicidal ideation 05/15/2019   Hypothyroidism 05/15/2019   Constipation 05/06/2019   Scrotal mass 05/06/2019   Abnormal urine odor 05/06/2019   Bipolar 1 disorder ([place]) 12/23/2018   Chronic pain of left knee 09/17/2018   Atypical chest pain 10/11/2017   Polypharmacy 08/05/2017   Cerebrovascular accident (CVA) due to stenosis of left middle cerebral artery ([place]) 06/26/2017   Urinary retention due to benign prostatic hyperplasia 06/22/2017   Falls 06/20/2017   Risk for falls 02/19/2017   Dyslipidemia 11/19/2016   Peripheral artery disease ([place]) 08/28/2016   Claudication ([place]) 08/09/2016   History of alcohol abuse 08/09/2016   History of drug dependence/abuse ([place]) 08/09/2016   Severe single current episode of major depressive disorder, without psychotic features ([place]) 08/09/2016   Mitral insufficiency 01/04/2016   Prediabetes 12/29/2015   Essential hypertension 12/29/2015   Fracture of left clavicle 07/13/2012   PCP:  [facility]:   [place], [place] - [address] [place] [address] Phone: [phone] Fax: [phone]     Social Determinants of Health (SDOH) Social History: [place]: No Food Insecurity (05/23/2022)  Housing: Low Risk  (05/23/2022)  Transportation Needs: No Transportation Needs (05/23/2022)  Utilities: Not At Risk (05/23/2022)  Alcohol Screen: Low Risk  (05/15/2019)  Tobacco Use: High Risk (08/04/2022)   SDOH Interventions:     Readmission Risk Interventions    08/05/2022    8:36 AM 03/15/2022    2:44 PM  Readmission Risk Prevention Plan  Transportation Screening Complete   HRI or [place]  Complete  Social Work Consult for [place] Planning/Counseling  Complete   Palliative Care Screening  Not Applicable  Medication Review Press photographer) Complete Complete  PCP or Specialist appointment within 3-5 days of discharge Complete   HRI or [place] Complete   [place] Not Applicable

## 2022-08-05 NOTE — Significant Event (Signed)
Rapid Response Event Note   Reason for Call :  Respiratory distress  Initial Focused Assessment:  Patient is alert and answering questions.  Ox 2 per RN this has been his baseline today. Lung sounds with wheezes through out.  He is labored RR 40s  BP 121/62  Vpaced 70s  RR 35-40  O2 sat 90 on [place]  Primofit placed: voiding  Interventions:  60mg  Lasix IV Placed on Bipap 50%   18/8 RR 30s-40   Weaned Fio2 to 30% (per RT) PCXR done  He is breathing easier on Bipap but remains tachypneic    Plan of Care:  Monitor patient's response to Bipap RN to call if patient continues to have rapid RR or is labored   Event Summary:   MD Notified: Candiss Norse Call Time: 0981 Arrival Time: [place] Time: [place]  Raliegh Ip, RN

## 2022-08-05 NOTE — Progress Notes (Signed)
Physical Therapy Evaluation Patient Details Name: Carl [name] MRN: [id number] DOB: [birth year] Today's Date: 08/05/2022  History of Present Illness  60yoM who was admitted from [facility] on 1/28 after recent admissions, now referred to hosp for CHF, elevation of troponin and ventricular tachycardia.  Had chest pain, and was given synchronized cardioversion then to [facility].  PMH: COPD, CHF, BPD, PAF, seizure DO, CVA. AiCD pacer, bipolar, smoker, stroke, DM, hypothyroidism,  Clinical Impression  Pt was seen for mobility on the hallway, noted his struggle with RR being a bit higher in 30 breaths per min range at times.  Has consistent HR in mid 80's, sats high 90's with lowest 96% and was consistently moving on walker.  Pt is mildly agitated with PT correcting his path on walker and tried to jump up quickly on side of bed despite lines.  Talked with him about safety and response was "that's just how I am."  Will follow along with him for quality of gait, safety education and will anticipate HHPT follows up due to his RR, use of O2 and tendency to make unsafe mobility choices.  Follow acutely for goals of PT as are outlined below.       Recommendations for follow up therapy are one component of a multi-disciplinary discharge planning process, led by the attending physician.  Recommendations may be updated based on patient status, additional functional criteria and insurance authorization.  Follow Up Recommendations Home health PT      Assistance Recommended at Discharge Intermittent Supervision/Assistance  Patient can return home with the following  A little help with walking and/or transfers;A little help with bathing/dressing/bathroom    Equipment Recommendations None recommended by PT  Recommendations for Other Services       Functional Status Assessment Patient has had a recent decline in their functional status and demonstrates the ability to make significant improvements in  function in a reasonable and predictable amount of time.     Precautions / Restrictions Precautions Precautions: Fall Precaution Comments: concerningly impulsive, agitated Restrictions Weight Bearing Restrictions: No      Mobility  Bed Mobility Overal bed mobility: Needs Assistance Bed Mobility: Supine to Sit, Sit to Supine     Supine to sit: Min assist Sit to supine: Min assist   General bed mobility comments: pt is disliking contact to help him, nearly tactile defensive    Transfers Overall transfer level: Needs assistance Equipment used: Rolling walker (2 wheels) Transfers: Sit to/from Stand Sit to Stand: Min assist           General transfer comment: pt is trying to stand alone and has no regard for mult lines, requires redirection but also dislikes that    Ambulation/Gait Ambulation/Gait assistance: Min assist, Min guard Gait Distance (Feet): 60 Feet Assistive device: Rolling walker (2 wheels) Gait Pattern/deviations: Step-through pattern, Decreased stride length Gait velocity: reduced, variable Gait velocity interpretation: <1.31 ft/sec, indicative of household ambulator Pre-gait activities: standing vitals ck General Gait Details: pt shortened up walk over his agitation but was primarily over PT assisting him to not strike obstacles.  Became a bit SOB with HR 85 and sats 96-98%  Stairs            Wheelchair Mobility    Modified Rankin (Stroke Patients Only)       Balance Overall balance assessment: Needs assistance Sitting-balance support: Feet supported Sitting balance-Leahy Scale: Fair     Standing balance support: Bilateral upper extremity supported, During functional activity Standing balance-Leahy Scale:  Fair Standing balance comment: less than fair dynamically                             Pertinent Vitals/Pain Pain Assessment Pain Assessment: Faces Faces Pain Scale: No hurt    Home Living Family/patient expects to be  discharged to:: Assisted living                 Home Equipment: Rolling Walker (2 wheels) Additional Comments: pt is impulsive and poor historian    Prior Function Prior Level of Function : Patient poor historian/Family not available             Mobility Comments: wheelchair and RW       Hand Dominance   Dominant Hand: Right    Extremity/Trunk Assessment   Upper Extremity Assessment Upper Extremity Assessment: Overall WFL for tasks assessed    Lower Extremity Assessment Lower Extremity Assessment: Generalized weakness    Cervical / Trunk Assessment Cervical / Trunk Assessment: Kyphotic  Communication   Communication: Expressive difficulties;Other (comment) (clear decay of teeth)  Cognition Arousal/Alertness: Awake/alert Behavior During Therapy: Impulsive, Anxious Overall Cognitive Status: No family/caregiver present to determine baseline cognitive functioning                                 General Comments: unclear if pt is this unsettled at baseline        General Comments General comments (skin integrity, edema, etc.): Pt is up to walk with help, noted his unsafe maneuvering on walker with mild corrections, but pt declined to continue with walk as this mildly agitiated him    Exercises     Assessment/Plan    PT Assessment Patient needs continued PT services  PT Problem List Decreased strength;Decreased activity tolerance;Decreased balance;Decreased coordination;Decreased knowledge of use of DME;Decreased safety awareness;Cardiopulmonary status limiting activity (elevation of RR)       PT Treatment Interventions DME instruction;Gait training;Functional mobility training;Therapeutic activities;Therapeutic exercise;Balance training;Neuromuscular re-education;Patient/family education    PT Goals (Current goals can be found in the Care Plan section)  Acute Rehab PT Goals Patient Stated Goal: none stated PT Goal Formulation: Patient  unable to participate in goal setting Time For Goal Achievement: 09/09/22 Potential to Achieve Goals: Good    Frequency Min 3X/week     Co-evaluation               AM-PAC PT "6 Clicks" Mobility  Outcome Measure Help needed turning from your back to your side while in a flat bed without using bedrails?: None Help needed moving from lying on your back to sitting on the side of a flat bed without using bedrails?: A Little Help needed moving to and from a bed to a chair (including a wheelchair)?: A Little Help needed standing up from a chair using your arms (e.g., wheelchair or bedside chair)?: A Little Help needed to walk in hospital room?: A Little Help needed climbing 3-5 steps with a railing? : A Lot 6 Click Score: 18    End of Session Equipment Utilized During Treatment: Gait belt;Oxygen Activity Tolerance: Patient limited by fatigue;Treatment limited secondary to agitation Patient left: in bed;with call bell/phone within reach;with bed alarm set;with nursing/sitter in room Nurse Communication: Mobility status PT Visit Diagnosis: Unsteadiness on feet (R26.81);Muscle weakness (generalized) (M62.81);Difficulty in walking, not elsewhere classified (R26.2)    Time: 5188-4166 PT Time Calculation (min) (ACUTE ONLY): 39  min   Charges:   PT Evaluation $PT Eval Moderate Complexity: 1 Mod PT Treatments $Gait Training: 8-22 mins $Therapeutic Activity: 8-22 mins       Ivar Drape 08/05/2022, 12:54 PM  Samul Dada, PT PhD Acute Rehab Dept. Number: [facility] [id number] and [facility] [phone]

## 2022-08-05 NOTE — Plan of Care (Signed)
  Problem: Safety: Goal: Ability to remain free from injury will improve Outcome: Progressing

## 2022-08-06 DIAGNOSIS — I472 Ventricular tachycardia, unspecified: Secondary | ICD-10-CM | POA: Diagnosis not present

## 2022-08-06 LAB — GLUCOSE, CAPILLARY
Glucose-Capillary: 110 mg/dL — ABNORMAL HIGH (ref 70–99)
Glucose-Capillary: 116 mg/dL — ABNORMAL HIGH (ref 70–99)
Glucose-Capillary: 130 mg/dL — ABNORMAL HIGH (ref 70–99)
Glucose-Capillary: 153 mg/dL — ABNORMAL HIGH (ref 70–99)

## 2022-08-06 LAB — CBC WITH DIFFERENTIAL/PLATELET
Abs Immature Granulocytes: 0.07 10*3/uL (ref 0.00–0.07)
Basophils Absolute: 0.1 10*3/uL (ref 0.0–0.1)
Basophils Relative: 1 %
Eosinophils Absolute: 0.1 10*3/uL (ref 0.0–0.5)
Eosinophils Relative: 1 %
HCT: 37.3 % — ABNORMAL LOW (ref 39.0–52.0)
Hemoglobin: 12.7 g/dL — ABNORMAL LOW (ref 13.0–17.0)
Immature Granulocytes: 1 %
Lymphocytes Relative: 17 %
Lymphs Abs: 1.6 10*3/uL (ref 0.7–4.0)
MCH: 29.7 pg (ref 26.0–34.0)
MCHC: 34 g/dL (ref 30.0–36.0)
MCV: 87.1 fL (ref 80.0–100.0)
Monocytes Absolute: 1.2 10*3/uL — ABNORMAL HIGH (ref 0.1–1.0)
Monocytes Relative: 13 %
Neutro Abs: 6.1 10*3/uL (ref 1.7–7.7)
Neutrophils Relative %: 67 %
Platelets: 183 10*3/uL (ref 150–400)
RBC: 4.28 MIL/uL (ref 4.22–5.81)
RDW: 17.3 % — ABNORMAL HIGH (ref 11.5–15.5)
WBC: 9.2 10*3/uL (ref 4.0–10.5)
nRBC: 0 % (ref 0.0–0.2)

## 2022-08-06 LAB — BASIC METABOLIC PANEL
Anion gap: 10 (ref 5–15)
BUN: 26 mg/dL — ABNORMAL HIGH (ref 8–23)
CO2: 27 mmol/L (ref 22–32)
Calcium: 9.2 mg/dL (ref 8.9–10.3)
Chloride: 98 mmol/L (ref 98–111)
Creatinine, Ser: 2 mg/dL — ABNORMAL HIGH (ref 0.61–1.24)
GFR, Estimated: 37 mL/min — ABNORMAL LOW (ref >60)
Glucose, Bld: 114 mg/dL — ABNORMAL HIGH (ref 70–99)
Potassium: 4.5 mmol/L (ref 3.5–5.1)
Sodium: 135 mmol/L (ref 135–145)

## 2022-08-06 LAB — UREA NITROGEN, URINE: Urea Nitrogen, Ur: 708 mg/dL

## 2022-08-06 LAB — MAGNESIUM: Magnesium: 2.4 mg/dL (ref 1.7–2.4)

## 2022-08-06 LAB — BRAIN NATRIURETIC PEPTIDE: B Natriuretic Peptide: 1023 pg/mL — ABNORMAL HIGH (ref 0.0–100.0)

## 2022-08-06 MED ORDER — SPIRONOLACTONE 12.5 MG HALF TABLET
12.5000 mg | ORAL_TABLET | Freq: Once | ORAL | Status: AC
  Administered 2022-08-06: 12.5 mg via ORAL
  Filled 2022-08-06: qty 1

## 2022-08-06 MED ORDER — FUROSEMIDE 40 MG PO TABS
60.0000 mg | ORAL_TABLET | Freq: Two times a day (BID) | ORAL | Status: DC
  Administered 2022-08-06: 60 mg via ORAL
  Filled 2022-08-06: qty 1

## 2022-08-06 MED ORDER — FUROSEMIDE 10 MG/ML IJ SOLN
40.0000 mg | Freq: Once | INTRAMUSCULAR | Status: AC
  Administered 2022-08-06: 40 mg via INTRAVENOUS
  Filled 2022-08-06: qty 4

## 2022-08-06 NOTE — Progress Notes (Addendum)
Electrophysiology Rounding Note  Patient Name: Carl [name] Date of Encounter: 08/06/2022  Primary Cardiologist: Carl Sacramento, MD Electrophysiologist: Carl Axe, MD   Subjective   The patient is doing well today.  At this time, the patient denies chest pain, shortness of breath, or any new concerns.  Inpatient Medications    Scheduled Meds:  amiodarone  400 mg Oral BID   apixaban  5 mg Oral BID   divalproex  500 mg Oral BID   escitalopram  10 mg Oral QHS   hydrOXYzine  10 mg Oral BID   insulin aspart  0-5 Units Subcutaneous QHS   insulin aspart  0-6 Units Subcutaneous TID WC   isosorbide mononitrate  15 mg Oral Daily   levETIRAcetam  250 mg Oral BID   levothyroxine  50 mcg Oral Daily   metoprolol succinate  12.5 mg Oral QHS   mexiletine  250 mg Oral Q12H   pantoprazole  40 mg Oral Daily   QUEtiapine  200 mg Oral QHS   rosuvastatin  10 mg Oral Daily   sodium chloride flush  3 mL Intravenous Q12H   umeclidinium bromide  1 puff Inhalation Daily   Continuous Infusions:  PRN Meds: acetaminophen **OR** acetaminophen, albuterol, nitroGLYCERIN, oxyCODONE   Vital Signs    Vitals:   08/05/22 2338 08/06/22 0103 08/06/22 0308 08/06/22 0523  BP:  107/68  98/68  Pulse: 60  91 62  Resp: 20 19 18 17   Temp:  98.3 F (36.8 C)  98.7 F (37.1 C)  TempSrc:  Axillary  Axillary  SpO2: 100% 100% 98% 98%  Weight:  83.4 kg    Height:        Intake/Output Summary (Last 24 hours) at 08/06/2022 0714 Last data filed at 08/06/2022 0600 Gross per 24 hour  Intake 960 ml  Output 2375 ml  Net -1415 ml   Filed Weights   08/05/22 0004 08/05/22 0600 08/06/22 0103  Weight: 84.2 kg 84.2 kg 83.4 kg    Physical Exam    GEN- The patient is well appearing, alert and oriented x 3 today.   HEENT- No gross abnormality.  Lungs- Clear to ausculation bilaterally, normal work of breathing Heart- Regular rate and rhythm, no murmurs, rubs or gallops GI- soft, NT, ND, + BS Extremities- no  clubbing or cyanosis. No edema Neuro- No obvious focal abnormality.   Telemetry    NSR 60-70s (personally reviewed)   Patient Profile     Carl [name] is a 62 y.o. male with a hx of chronic systolic heart failure, ventricular tachycardia, bipolar disease, CKD who is being seen 08/04/2022 for the evaluation of VT at the request of Carl [name].   Assessment & Plan    Ventricular tachycardia storm Continue amiodarone 400 mg BID with taper to 200 mg BID after one week until follow up Continue mexitil 250 mg BID. Potassium4.5 (01/30 0123) Magnesium  2.4 (01/30 0123) Creatinine, ser  2.00* (01/30 0123) Keep K > 4.0 and Mg > 2.0    2. Chronic systolic heart failure: Echo 08/04/2022 LVEF 25-30% Volume status looks OK. Watch carefully with IVF for AKI.    3. Acute on chronic renal failure: CKD stage IIIa.   Cr 2.28 -> 2.11 -> 1.99 -> 2.0   4. Coronary artery disease: No chest pain.  Recent cath 04/2022 with stable disease, no troponin trend this admission.    5. Paroxysmal atrial fibrillation: Maintaining NSR Continue Eliquis.   For questions or updates, please contact Carl [name]  Please consult [email or web address] for contact info under Cardiology/STEMI.  Signed, Carl Friar, PA-C  08/06/2022, 7:14 AM   I have seen and examined this patient with Carl [name].  Agree with above, note added to reflect my findings.  Feeling well.  No further ventricular arrhythmias.  GEN: Well nourished, well developed, in no acute distress  HEENT: normal  Neck: no JVD, carotid bruits, or masses Cardiac: RRR; no murmurs, rubs, or gallops,no edema  Respiratory:  clear to auscultation bilaterally, normal work of breathing GI: soft, nontender, nondistended, + BS MS: no deformity or atrophy  Skin: warm and dry, device site well healed Neuro:  Strength and sensation are intact Psych: euthymic mood, full affect   VT storm: Has been switched to amiodarone.  Carl [name] continue with the taper  as above.  Mexiletine has been started.  No further ventricular arrhythmias.  Carl [name] arrange for follow-up in EP clinic.  At this point, no further EP workup necessary. Chronic systolic heart failure: Ejection fraction 25 to 30%.  No obvious volume overload.  Received IV fluids for AKI. Acute on chronic renal failure: Stage CKD 3A.  Creatinine improving.\ Coronary disease: No current chest pain Paroxysmal atrial fibrillation: Remaining sinus rhythm.  Continue Eliquis.  Carl [name] M. Carl Ybarbo MD 08/06/2022 9:37 AM

## 2022-08-06 NOTE — Progress Notes (Signed)
PROGRESS NOTE                                                                                                                                                                                                             Patient Demographics:    Carl [name], is a 62 y.o. male, DOB - Jun 03, 1961, NGE:[id number]  Outpatient Primary MD for the patient is [facility]    LOS - 2  Admit date - 08/04/2022    Chief Complaint  Patient presents with   Chest Pain       Brief Narrative (HPI from H&P)   62 y.o. male with medical history significant for hypertension, type 2 diabetes mellitus, bipolar disorder, CAD status post CABG, history of seizure, history of stroke, chronic systolic CHF EF 30% has biventricular pacemaker with AICD and atrial fibrillation on Eliquis who presents emergency department for evaluation of ventricular tachycardia.  He lives at an SNF where he went out to smoke a cigarette on the day of admission and developed some chest discomfort when EMS arrived he was found to be in V. tach and brought to the ER.   Subjective:    Carl [name] today has, No headache, No chest pain, No abdominal pain - No Nausea, No new weakness tingling or numbness, no SOB.   Assessment  & Plan :    1. Ventricular tachycardia in a patient with chronic ischemic cardiomyopathy with chronic systolic heart failure EF around 30% has biventricular pacemaker/AICD device in place - Severe MR.  He is chronically on oral amiodarone, seen by cardiology in the ER and placed on amiodarone drip + Mexitil, currently blood pressure low and in AKI hence cannot do beta-blocker, ACE/ARB or Entresto.  Clinically appears dehydrated.  Defer management of V. tach and chest pain to cardiology team.  Currently chest pain-free and symptom-free, stable electrolytes.  Repeat echocardiogram noted.  2. AKI superimposed on CKD IIIa  - - SCr is 2.28 on  admission, slight creatinine is under 1.5 appears dehydrated, urine electrolytes ordered, hold diuretics, hold blood pressure medications again today as still SBP < 90  3. Acute on Chronic HFrEF 30% on recent echocardiogram, has AICD with biventricular pacemaker.  Went into Pulm edema evening of 11/04/2022 required BiPAP for 12 hours along with IV Lasix, now stable transition to nasal cannula oxygen morning  of 08/06/2022.  Cardiology on board.  Blood pressure still too low for ACE ARB or Entresto.  Low-dose beta-blocker and Lasix along with Aldactone.  4. PAF with [place] vas 2 score of greater than 3.  Continue Eliqius and Toprol as tolerated by blood pressure   5. Elevated troponin; CAD - No anginal complaints, troponin trend is flat and in non-ACS pattern.  Continue statin for secondary prevention, beta-blocker if tolerated by blood pressure, on Eliquis, cardiology on board.  Chest pain-free.    6. Bipolar disorder  - Continue Lexapro, Atarax, and Seroquel     7. Hx of seizures  - Continue Depakote and Keppra     8.  Hypothyroidism - Continue Synthroid   9. Type II DM  - A1c was 6.6% in September 2023, Check CBGs and use low-intensity SSI for now   CBG (last 3)  Recent Labs    08/05/22 1559 08/05/22 2128 08/06/22 0604  GLUCAP 126* 125* 110*          Condition - Extremely Guarded  Family Communication  :  None  Code Status :  Full  Consults  :  Cards  PUD Prophylaxis :    Procedures  :     TTE -  1. Left ventricular ejection fraction, by estimation, is 25 to 30%. The left ventricle has severely decreased function. The left ventricle demonstrates regional wall motion abnormalities (see scoring diagram/findings for description). The left ventricular internal cavity size was moderately dilated. Left ventricular diastolic function could not be evaluated.  2. Right ventricular systolic function is normal. The right ventricular size is normal. There is normal pulmonary artery systolic  pressure. The estimated right ventricular systolic pressure is 123XX123 mmHg.  3. Left atrial size was severely dilated.  4. The mitral valve has been repaired/replaced. Severe mitral valve regurgitation. Mild mitral stenosis. The mean mitral valve gradient is 5.5 mmHg with average heart rate of 66 bpm.  5. The aortic valve is tricuspid. There is mild thickening of the aortic valve. Aortic valve regurgitation is not visualized. Aortic valve sclerosis is present, with no evidence of aortic valve stenosis.  6. The inferior vena cava is normal in size with greater than 50% respiratory variability, suggesting right atrial pressure of 3 mmHg.      Disposition Plan  :    Status is: Inpatient   DVT Prophylaxis  :     apixaban (ELIQUIS) tablet 5 mg     Lab Results  Component Value Date   PLT 183 08/06/2022    Diet :  Diet Order             DIET SOFT Room service appropriate? Yes; Fluid consistency: Thin  Diet effective now                    Inpatient Medications  Scheduled Meds:  amiodarone  400 mg Oral BID   apixaban  5 mg Oral BID   divalproex  500 mg Oral BID   escitalopram  10 mg Oral QHS   hydrOXYzine  10 mg Oral BID   insulin aspart  0-5 Units Subcutaneous QHS   insulin aspart  0-6 Units Subcutaneous TID WC   isosorbide mononitrate  15 mg Oral Daily   levETIRAcetam  250 mg Oral BID   levothyroxine  50 mcg Oral Daily   metoprolol succinate  12.5 mg Oral QHS   mexiletine  250 mg Oral Q12H   pantoprazole  40 mg Oral Daily  QUEtiapine  200 mg Oral QHS   rosuvastatin  10 mg Oral Daily   sodium chloride flush  3 mL Intravenous Q12H   umeclidinium bromide  1 puff Inhalation Daily   Continuous Infusions:   PRN Meds:.acetaminophen **OR** acetaminophen, albuterol, nitroGLYCERIN, oxyCODONE  Antibiotics  :    Anti-infectives (From admission, onward)    None         Objective:   Vitals:   08/06/22 0103 08/06/22 0308 08/06/22 0523 08/06/22 0800  BP: 107/68   98/68 104/68  Pulse:  91 62 73  Resp: 19 18 17 19   Temp: 98.3 F (36.8 C)  98.7 F (37.1 C)   TempSrc: Axillary  Axillary   SpO2: 100% 98% 98% 99%  Weight: 83.4 kg     Height:        Wt Readings from Last 3 Encounters:  08/06/22 83.4 kg  08/03/22 84.8 kg  07/31/22 84.9 kg     Intake/Output Summary (Last 24 hours) at 08/06/2022 0855 Last data filed at 08/06/2022 0600 Gross per 24 hour  Intake 480 ml  Output 2375 ml  Net -1895 ml     Physical Exam  Awake Alert, No new F.N deficits, Normal affect Dane.AT,PERRAL Supple Neck, No JVD,   Symmetrical Chest wall movement, Good air movement bilaterally, CTAB RRR,No Gallops, Rubs or new Murmurs,  +ve B.Sounds, Abd Soft, No tenderness,   No Cyanosis, Clubbing or edema       Data Review:    Recent Labs  Lab 08/03/22 1935 08/04/22 0546 08/05/22 0042 08/06/22 0123  WBC 8.2 9.2 10.9* 9.2  HGB 14.2 13.2 12.4* 12.7*  HCT 42.6 38.5* 36.4* 37.3*  PLT 223 217 199 183  MCV 87.3 86.3 86.5 87.1  MCH 29.1 29.6 29.5 29.7  MCHC 33.3 34.3 34.1 34.0  RDW 17.1* 17.2* 17.1* 17.3*  LYMPHSABS 2.3  --  1.2 1.6  MONOABS 0.4  --  1.1* 1.2*  EOSABS 0.4  --  0.2 0.1  BASOSABS 0.1  --  0.1 0.1    Recent Labs  Lab 08/03/22 1935 08/04/22 0153 08/04/22 0424 08/04/22 0546 08/05/22 0042 08/06/22 0123  NA 129*  --   --  131* 130* 135  K 4.0  --   --  4.7 4.6 4.5  CL 93*  --   --  97* 98 98  CO2 23  --   --  22 23 27   ANIONGAP 13  --   --  12 9 10   GLUCOSE 193*  --   --  104* 167* 114*  BUN 34*  --   --  32* 26* 26*  CREATININE 2.28*  --   --  2.11* 1.99* 2.00*  AST 35  --   --   --   --   --   ALT 21  --   --   --   --   --   ALKPHOS 60  --   --   --   --   --   BILITOT 0.7  --   --   --   --   --   ALBUMIN 3.6  --   --   --   --   --   INR  --  1.2  --   --   --   --   BNP 715.9*  --  681.9*  --  941.5* 1,023.0*  MG 2.4  --   --  2.4 2.4 2.4  CALCIUM 9.1  --   --  8.9 8.9 9.2      Recent Labs  Lab 08/03/22 1935  08/04/22 0153 08/04/22 0424 08/04/22 0546 08/05/22 0042 08/06/22 0123  INR  --  1.2  --   --   --   --   BNP 715.9*  --  681.9*  --  941.5* 1,023.0*  MG 2.4  --   --  2.4 2.4 2.4  CALCIUM 9.1  --   --  8.9 8.9 9.2    Recent Labs  Lab 08/03/22 1935 08/04/22 0546 08/05/22 0042 08/06/22 0123  WBC 8.2 9.2 10.9* 9.2  PLT 223 217 199 183  CREATININE 2.28* 2.11* 1.99* 2.00*    ------------------------------------------------------------------------------------------------------------------ No results for input(s): "CHOL", "HDL", "LDLCALC", "TRIG", "CHOLHDL", "LDLDIRECT" in the last 72 hours.  Lab Results  Component Value Date   HGBA1C 6.6 (H) 03/25/2022    No results for input(s): "TSH", "T4TOTAL", "T3FREE", "THYROIDAB" in the last 72 hours.  Invalid input(s): "FREET3" ------------------------------------------------------------------------------------------------------------------ Cardiac Enzymes No results for input(s): "CKMB", "TROPONINI", "MYOGLOBIN" in the last 168 hours.  Invalid input(s): "CK"  Micro Results No results found for this or any previous visit (from the past 240 hour(s)).  Radiology Reports DG Chest Port 1 View  Result Date: 08/05/2022 CLINICAL DATA:  Shortness of breath EXAM: PORTABLE CHEST 1 VIEW COMPARISON:  08/05/2022 at 6:20 a.m. FINDINGS: AICD noted. External defibrillator pads are in place. Mild enlargement of the cardiopericardial silhouette. Prominence of upper zone pulmonary vasculature may reflect pulmonary venous hypertension but is nonspecific on today's semi erect portable chest radiograph. Prosthetic mitral valve. No blunting of the costophrenic angles. There is some hazy density at the left lung base probably from subsegmental atelectasis or scarring. Left clavicular pin noted. IMPRESSION: 1. Mild enlargement of the cardiopericardial silhouette, without overt edema. 2. Prominence of the upper zone pulmonary vasculature may reflect pulmonary  venous hypertension but is nonspecific on today's semi erect portable chest radiograph. 3. Prosthetic mitral valve. 4. AICD noted. Electronically Signed   By: Van Clines M.D.   On: 08/05/2022 15:34   DG Chest Port 1 View  Result Date: 08/05/2022 CLINICAL DATA:  [id number] with shortness of breath. EXAM: PORTABLE CHEST 1 VIEW COMPARISON:  Portable chest yesterday at 1:27 a.m. FINDINGS: 6:22 a.m. Left chest pacemaker/AID and wiring are stable. Overlying electrical pads are again noted. There are sternotomy sutures and mitral valve metal ring prosthesis. Moderate to severe cardiomegaly. Stable mediastinum with aortic calcific plaques. Today there is increased perihilar vascular congestion, and development of mild generalized interstitial edema throughout the lung fields with small layering pleural effusions. No acute airspace infiltrate is seen. The lungs are otherwise clear. A fracture fixation rod is again noted in the left clavicle with healed fracture deformity. IMPRESSION: Cardiomegaly with increased perihilar vascular congestion and development of mild generalized interstitial edema and small layering pleural effusions. Findings consistent with CHF or fluid overload. Electronically Signed   By: Telford Nab M.D.   On: 08/05/2022 06:35   ECHOCARDIOGRAM COMPLETE  Result Date: 08/04/2022    ECHOCARDIOGRAM REPORT   Patient Name:   MATHEUS SPORN Date of Exam: 08/04/2022 Medical Rec #:  [id number]    Height:       69.0 in Accession #:    [id number]   Weight:       187.0 lb Date of Birth:  1960/08/12    BSA:          2.008 m Patient Age:    57 years     BP:  114/71 mmHg Patient Gender: M            HR:           64 bpm. Exam Location:  Inpatient Procedure: 2D Echo Indications:    ventricular tachycardia  History:        Patient has prior history of Echocardiogram examinations, most                 recent 02/16/2022. Cardiomyopathy, CAD, Defibrillator, chronic                 kidney disease. and COPD,  Arrythmias:Paroxysmal A-Fib; Risk                 Factors:Diabetes, Current Smoker and Dyslipidemia.  Sonographer:    Johny Chess RDCS Referring Phys: [id number] [place]  1. Left ventricular ejection fraction, by estimation, is 25 to 30%. The left ventricle has severely decreased function. The left ventricle demonstrates regional wall motion abnormalities (see scoring diagram/findings for description). The left ventricular internal cavity size was moderately dilated. Left ventricular diastolic function could not be evaluated.  2. Right ventricular systolic function is normal. The right ventricular size is normal. There is normal pulmonary artery systolic pressure. The estimated right ventricular systolic pressure is 02.5 mmHg.  3. Left atrial size was severely dilated.  4. The mitral valve has been repaired/replaced. Severe mitral valve regurgitation. Mild mitral stenosis. The mean mitral valve gradient is 5.5 mmHg with average heart rate of 66 bpm.  5. The aortic valve is tricuspid. There is mild thickening of the aortic valve. Aortic valve regurgitation is not visualized. Aortic valve sclerosis is present, with no evidence of aortic valve stenosis.  6. The inferior vena cava is normal in size with greater than 50% respiratory variability, suggesting right atrial pressure of 3 mmHg. Comparison(s): No significant change from prior study. Prior images reviewed side by side. Mitral insufficiency was underestimated on the previous report. FINDINGS  Left Ventricle: Left ventricular ejection fraction, by estimation, is 25 to 30%. The left ventricle has severely decreased function. The left ventricle demonstrates regional wall motion abnormalities. The left ventricular internal cavity size was moderately dilated. There is no left ventricular hypertrophy. Left ventricular diastolic function could not be evaluated due to mitral valve repair. Left ventricular diastolic function could not be evaluated.   LV Wall Scoring: The apical septal segment, apical inferior segment, and apex are dyskinetic. The mid and distal anterior wall, mid and distal lateral wall, posterior wall, and mid anterolateral segment are akinetic. The mid inferior segment is hypokinetic. The anterior septum, basal anterolateral segment, mid inferoseptal segment, basal anterior segment, basal inferior segment, and basal inferoseptal segment are normal. Right Ventricle: The right ventricular size is normal. No increase in right ventricular wall thickness. Right ventricular systolic function is normal. There is normal pulmonary artery systolic pressure. The tricuspid regurgitant velocity is 2.69 m/s, and  with an assumed right atrial pressure of 3 mmHg, the estimated right ventricular systolic pressure is 42.7 mmHg. Left Atrium: Left atrial size was severely dilated. Right Atrium: Right atrial size was normal in size. Pericardium: There is no evidence of pericardial effusion. Mitral Valve: The mitral valve has been repaired/replaced. Severe mitral valve regurgitation. There is a prosthetic annuloplasty ring present in the mitral position. Mild mitral valve stenosis. MV peak gradient, 19.5 mmHg. The mean mitral valve gradient is 5.5 mmHg with average heart rate of 66 bpm. Tricuspid Valve: The tricuspid valve is normal in structure. Tricuspid valve  regurgitation is trivial. Aortic Valve: The aortic valve is tricuspid. There is mild thickening of the aortic valve. Aortic valve regurgitation is not visualized. Aortic valve sclerosis is present, with no evidence of aortic valve stenosis. Pulmonic Valve: The pulmonic valve was normal in structure. Pulmonic valve regurgitation is not visualized. Aorta: The aortic root and ascending aorta are structurally normal, with no evidence of dilitation. Venous: The inferior vena cava is normal in size with greater than 50% respiratory variability, suggesting right atrial pressure of 3 mmHg. IAS/Shunts: No atrial  level shunt detected by color flow Doppler. Additional Comments: A device lead is visualized in the right ventricle and right atrium.  LEFT VENTRICLE PLAX 2D LVIDd:         6.60 cm   Diastology LVIDs:         5.50 cm   LV e' medial:  4.57 cm/s LV PW:         1.10 cm   LV e' lateral: 4.13 cm/s LV IVS:        1.10 cm LVOT diam:     2.00 cm LV SV:         52 LV SV Index:   26 LVOT Area:     3.14 cm  RIGHT VENTRICLE          IVC RV Basal diam:  2.90 cm  IVC diam: 1.70 cm TAPSE (M-mode): 1.4 cm LEFT ATRIUM              Index        RIGHT ATRIUM           Index LA diam:        5.70 cm  2.84 cm/m   RA Area:     17.40 cm LA Vol (A2C):   112.0 ml 55.78 ml/m  RA Volume:   42.60 ml  21.22 ml/m LA Vol (A4C):   111.0 ml 55.28 ml/m LA Biplane Vol: 115.0 ml 57.28 ml/m  AORTIC VALVE LVOT Vmax:   76.50 cm/s LVOT Vmean:  47.500 cm/s LVOT VTI:    0.166 m  AORTA Ao Root diam: 3.20 cm Ao Asc diam:  3.50 cm MITRAL VALVE              TRICUSPID VALVE MV Area (PHT): 2.52 cm   TR Peak grad:   28.9 mmHg MV Area VTI:   0.97 cm   TR Vmax:        269.00 cm/s MV Peak grad:  19.5 mmHg MV Mean grad:  5.5 mmHg   SHUNTS MV Vmax:       2.21 m/s   Systemic VTI:  0.17 m MV Vmean:      99.9 cm/s  Systemic Diam: 2.00 cm MV Decel Time: 301 msec MR Peak grad: 64.6 mmHg MR Mean grad: 40.0 mmHg MR Vmax:      402.00 cm/s MR Vmean:     297.0 cm/s Dani Gobble Croitoru MD Electronically signed by Sanda Klein MD Signature Date/Time: 08/04/2022/4:38:32 PM    Final    US RENAL  Result Date: 08/04/2022 CLINICAL DATA:  Acute renal failure EXAM: RENAL / URINARY TRACT ULTRASOUND COMPLETE COMPARISON:  CT scan 03/14/2022 FINDINGS: Right Kidney: Renal measurements: 9.7 x 5.2 x 5.5 cm = volume: 145 mL. Echogenicity within normal limits. No mass or hydronephrosis visualized. Left Kidney: Renal measurements: 11.6 x 5.4 x 4.5 cm = volume: 148 mL. 15 mm cyst identified towards the upper pole, similar to prior CT. 13 mm interpolar subcapsular cyst evident,  also present  on prior CT although more subtle. No hydronephrosis. Bladder: Appears normal for degree of bladder distention. Other: Liver parenchyma appears echogenic suggesting fatty deposition. IMPRESSION: No hydronephrosis. Sonographic features suggesting hepatic steatosis. Electronically Signed   By: Misty Stanley M.D.   On: 08/04/2022 06:49   DG Chest Portable 1 View  Result Date: 08/04/2022 CLINICAL DATA:  Chest pain and tachycardia EXAM: PORTABLE CHEST 1 VIEW COMPARISON:  05/22/2022 FINDINGS: Cardiac shadow is enlarged but stable. Postsurgical changes are seen. Defibrillator is again noted. Lungs are well aerated without focal infiltrate or sizable effusion. No acute bony abnormality is seen. Prior fixation of the left clavicle is again seen. IMPRESSION: No acute abnormality noted. Electronically Signed   By: Inez Catalina M.D.   On: 08/04/2022 01:46      Signature  -   Lala Lund M.D on 08/06/2022 at 8:55 AM   -  To page go to [email or web address]

## 2022-08-06 NOTE — Progress Notes (Signed)
Pt refused Bipap for this evening

## 2022-08-06 NOTE — Plan of Care (Signed)
  Problem: Coping: Goal: Ability to adjust to condition or change in health will improve Outcome: Progressing   Problem: Clinical Measurements: Goal: Respiratory complications will improve Outcome: Progressing   Problem: Activity: Goal: Risk for activity intolerance will decrease Outcome: Progressing   Problem: Coping: Goal: Level of anxiety will decrease Outcome: Progressing   Problem: Safety: Goal: Ability to remain free from injury will improve Outcome: Progressing

## 2022-08-06 NOTE — Progress Notes (Signed)
   08/06/22 1645  Mobility  Activity Ambulated with assistance in hallway  Level of Assistance Contact guard assist, steadying assist  Assistive Device Front wheel walker  Distance Ambulated (ft) 120 ft  Activity Response Tolerated well  Mobility Referral Yes  $Mobility charge 1 Mobility   Mobility Specialist Progress Note  Pt was in bed and agreeable. Had no c/o pain. Left in bed w/ all needs met and alarm on.  Lucious Groves Mobility Specialist  Please contact via SecureChat or Rehab office at [phone]

## 2022-08-07 ENCOUNTER — Other Ambulatory Visit (HOSPITAL_COMMUNITY): Payer: Self-pay

## 2022-08-07 DIAGNOSIS — I472 Ventricular tachycardia, unspecified: Secondary | ICD-10-CM | POA: Diagnosis not present

## 2022-08-07 LAB — BASIC METABOLIC PANEL
Anion gap: 12 (ref 5–15)
BUN: 30 mg/dL — ABNORMAL HIGH (ref 8–23)
CO2: 24 mmol/L (ref 22–32)
Calcium: 8.8 mg/dL — ABNORMAL LOW (ref 8.9–10.3)
Chloride: 96 mmol/L — ABNORMAL LOW (ref 98–111)
Creatinine, Ser: 1.97 mg/dL — ABNORMAL HIGH (ref 0.61–1.24)
GFR, Estimated: 37 mL/min — ABNORMAL LOW (ref >60)
Glucose, Bld: 121 mg/dL — ABNORMAL HIGH (ref 70–99)
Potassium: 3.9 mmol/L (ref 3.5–5.1)
Sodium: 132 mmol/L — ABNORMAL LOW (ref 135–145)

## 2022-08-07 LAB — GLUCOSE, CAPILLARY
Glucose-Capillary: 105 mg/dL — ABNORMAL HIGH (ref 70–99)
Glucose-Capillary: 100 mg/dL — ABNORMAL HIGH (ref 70–99)
Glucose-Capillary: 202 mg/dL — ABNORMAL HIGH (ref 70–99)
Glucose-Capillary: 91 mg/dL (ref 70–99)
Glucose-Capillary: 110 mg/dL — ABNORMAL HIGH (ref 70–99)
Glucose-Capillary: 110 mg/dL — ABNORMAL HIGH (ref 70–99)

## 2022-08-07 LAB — CBC WITH DIFFERENTIAL/PLATELET
Abs Immature Granulocytes: 0.08 10*3/uL — ABNORMAL HIGH (ref 0.00–0.07)
Basophils Absolute: 0.1 10*3/uL (ref 0.0–0.1)
Basophils Relative: 1 %
Eosinophils Absolute: 0.2 10*3/uL (ref 0.0–0.5)
Eosinophils Relative: 2 %
HCT: 34.8 % — ABNORMAL LOW (ref 39.0–52.0)
Hemoglobin: 11.7 g/dL — ABNORMAL LOW (ref 13.0–17.0)
Immature Granulocytes: 1 %
Lymphocytes Relative: 12 %
Lymphs Abs: 1.2 10*3/uL (ref 0.7–4.0)
MCH: 29.5 pg (ref 26.0–34.0)
MCHC: 33.6 g/dL (ref 30.0–36.0)
MCV: 87.7 fL (ref 80.0–100.0)
Monocytes Absolute: 1.4 10*3/uL — ABNORMAL HIGH (ref 0.1–1.0)
Monocytes Relative: 14 %
Neutro Abs: 7.3 10*3/uL (ref 1.7–7.7)
Neutrophils Relative %: 70 %
Platelets: 173 10*3/uL (ref 150–400)
RBC: 3.97 MIL/uL — ABNORMAL LOW (ref 4.22–5.81)
RDW: 16.9 % — ABNORMAL HIGH (ref 11.5–15.5)
WBC: 10.2 10*3/uL (ref 4.0–10.5)
nRBC: 0 % (ref 0.0–0.2)

## 2022-08-07 LAB — MAGNESIUM: Magnesium: 2.2 mg/dL (ref 1.7–2.4)

## 2022-08-07 LAB — BRAIN NATRIURETIC PEPTIDE: B Natriuretic Peptide: 846.9 pg/mL — ABNORMAL HIGH (ref 0.0–100.0)

## 2022-08-07 MED ORDER — SPIRONOLACTONE 25 MG PO TABS
25.0000 mg | ORAL_TABLET | Freq: Every day | ORAL | Status: DC
  Administered 2022-08-07 – 2022-08-08 (×2): 25 mg via ORAL
  Filled 2022-08-07 (×2): qty 1

## 2022-08-07 MED ORDER — FUROSEMIDE 40 MG PO TABS
80.0000 mg | ORAL_TABLET | Freq: Two times a day (BID) | ORAL | Status: DC
  Administered 2022-08-08: 80 mg via ORAL
  Filled 2022-08-07: qty 2

## 2022-08-07 MED ORDER — POTASSIUM CHLORIDE CRYS ER 20 MEQ PO TBCR
40.0000 meq | EXTENDED_RELEASE_TABLET | Freq: Once | ORAL | Status: AC
  Administered 2022-08-07: 40 meq via ORAL
  Filled 2022-08-07: qty 2

## 2022-08-07 MED ORDER — FUROSEMIDE 40 MG PO TABS
80.0000 mg | ORAL_TABLET | Freq: Two times a day (BID) | ORAL | Status: DC
  Administered 2022-08-07: 80 mg via ORAL
  Filled 2022-08-07: qty 2

## 2022-08-07 MED ORDER — FUROSEMIDE 10 MG/ML IJ SOLN: 60.0000 mg | Freq: Once | INTRAMUSCULAR | Status: DC

## 2022-08-07 MED ORDER — FUROSEMIDE 10 MG/ML IJ SOLN
40.0000 mg | Freq: Once | INTRAMUSCULAR | Status: AC
  Administered 2022-08-07: 40 mg via INTRAVENOUS
  Filled 2022-08-07: qty 4

## 2022-08-07 NOTE — Progress Notes (Signed)
   08/07/22 1531  Mobility  Activity Ambulated with assistance in hallway  Level of Assistance Contact guard assist, steadying assist  Assistive Device Front wheel walker  Distance Ambulated (ft) 120 ft  Activity Response Tolerated well  Mobility Referral Yes  $Mobility charge 1 Mobility   Mobility Specialist Progress Note  Pt was in bed and agreeable. Had no c/o pain. Left at sink for a bath with NT in room.  Lucious Groves Mobility Specialist  Please contact via SecureChat or Rehab office at [phone]

## 2022-08-07 NOTE — Progress Notes (Signed)
Nurse requested Mobility Specialist to perform oxygen saturation test with pt which includes removing pt from oxygen both at rest and while ambulating.  Below are the results from that testing.     Patient Saturations on Room Air at Rest = spO2 96%  Patient Saturations on Room Air while Ambulating = sp02 89% .  Rested and performed pursed lip breathing for 1 minute with sp02 at 90%.  Patient Saturations on 0 Liters of oxygen while Ambulating = sp02 90%  At end of testing pt left in room on 0  Liters of oxygen.  Reported results to nurse.

## 2022-08-07 NOTE — Progress Notes (Signed)
   08/07/22 1000  Mobility  Activity Ambulated with assistance in hallway  Level of Assistance Contact guard assist, steadying assist  Assistive Device Front wheel walker  Distance Ambulated (ft) 120 ft  Activity Response Tolerated well  Mobility Referral Yes  $Mobility charge 1 Mobility   Mobility Specialist Progress Note  Pt was In bed and agreeable. Had c/o knee pain. Left in bed w/ all needs met and alarm on.  Lucious Groves Mobility Specialist  Please contact via SecureChat or Rehab office at [phone]

## 2022-08-07 NOTE — Progress Notes (Signed)
PROGRESS NOTE                                                                                                                                                                                                             Patient Demographics:    Carl [name], is a 62 y.o. male, DOB - Nov 21, 1960, MGQ:[id number]  Outpatient Primary MD for the patient is [facility]    LOS - 3  Admit date - 08/04/2022    Chief Complaint  Patient presents with   Chest Pain       Brief Narrative (HPI from H&P)   62 y.o. male with medical history significant for hypertension, type 2 diabetes mellitus, bipolar disorder, CAD status post CABG, history of seizure, history of stroke, chronic systolic CHF EF 26% has biventricular pacemaker with AICD and atrial fibrillation on Eliquis who presents emergency department for evaluation of ventricular tachycardia.  He lives at an SNF where he went out to smoke a cigarette on the day of admission and developed some chest discomfort when EMS arrived he was found to be in V. tach and brought to the ER.   Subjective:   Patient in bed, appears comfortable, denies any headache, no fever, no chest pain or pressure, no shortness of breath , no abdominal pain. No new focal weakness.   Assessment  & Plan :    1. Ventricular tachycardia in a patient with chronic ischemic cardiomyopathy with chronic systolic heart failure EF around 30% has biventricular pacemaker/AICD device in place - Severe MR.  He is chronically on oral amiodarone, seen by cardiology in the ER and placed on amiodarone drip + Mexitil, currently blood pressure low and in AKI hence cannot do beta-blocker, ACE/ARB or Entresto.  Clinically appears dehydrated.  Defer management of V. tach and chest pain to cardiology team.  Currently chest pain-free and symptom-free, stable electrolytes.  Repeat echocardiogram noted.  2. AKI superimposed on CKD  IIIa  - - SCr is 2.28 on admission, slight creatinine is under 1.5 appears dehydrated, urine electrolytes ordered, hold diuretics, hold blood pressure medications again today as still SBP < 90  3. Acute on Chronic HFrEF 30% on recent echocardiogram, has AICD with biventricular pacemaker.  Went into Pulm edema evening of 11/04/2022 required BiPAP for 12 hours along with IV Lasix, now stable transition to nasal  cannula oxygen morning of 08/06/2022.  Cardiology on board.  Blood pressure still too low for ACE ARB or Entresto.  Low-dose beta-blocker and Lasix along with Aldactone.  4. PAF with [place] vas 2 score of greater than 3.  Continue Eliqius and Toprol as tolerated by blood pressure   5. Elevated troponin; CAD - No anginal complaints, troponin trend is flat and in non-ACS pattern.  Continue statin for secondary prevention, beta-blocker if tolerated by blood pressure, on Eliquis, cardiology on board.  Chest pain-free.    6. Bipolar disorder  - Continue Lexapro, Atarax, and Seroquel     7. Hx of seizures  - Continue Depakote and Keppra     8.  Hypothyroidism - Continue Synthroid   9. Type II DM  - A1c was 6.6% in September 2023, Check CBGs and use low-intensity SSI for now   CBG (last 3)  Recent Labs    08/07/22 0618 08/07/22 0709 08/07/22 1110  GLUCAP 105* 100* 202*          Condition - Extremely Guarded  Family Communication  :  None  Code Status :  Full  Consults  :  Cards  PUD Prophylaxis :    Procedures  :     TTE -  1. Left ventricular ejection fraction, by estimation, is 25 to 30%. The left ventricle has severely decreased function. The left ventricle demonstrates regional wall motion abnormalities (see scoring diagram/findings for description). The left ventricular internal cavity size was moderately dilated. Left ventricular diastolic function could not be evaluated.  2. Right ventricular systolic function is normal. The right ventricular size is normal. There is normal  pulmonary artery systolic pressure. The estimated right ventricular systolic pressure is 99.3 mmHg.  3. Left atrial size was severely dilated.  4. The mitral valve has been repaired/replaced. Severe mitral valve regurgitation. Mild mitral stenosis. The mean mitral valve gradient is 5.5 mmHg with average heart rate of 66 bpm.  5. The aortic valve is tricuspid. There is mild thickening of the aortic valve. Aortic valve regurgitation is not visualized. Aortic valve sclerosis is present, with no evidence of aortic valve stenosis.  6. The inferior vena cava is normal in size with greater than 50% respiratory variability, suggesting right atrial pressure of 3 mmHg.      Disposition Plan  :    Status is: Inpatient   DVT Prophylaxis  :     apixaban (ELIQUIS) tablet 5 mg     Lab Results  Component Value Date   PLT 173 08/07/2022    Diet :  Diet Order             DIET SOFT Room service appropriate? Yes; Fluid consistency: Thin; Fluid restriction: 1500 mL Fluid  Diet effective now                    Inpatient Medications  Scheduled Meds:  amiodarone  400 mg Oral BID   apixaban  5 mg Oral BID   divalproex  500 mg Oral BID   escitalopram  10 mg Oral QHS   furosemide  60 mg Intravenous Once   [START ON 08/08/2022] furosemide  80 mg Oral BID   hydrOXYzine  10 mg Oral BID   insulin aspart  0-5 Units Subcutaneous QHS   insulin aspart  0-6 Units Subcutaneous TID WC   isosorbide mononitrate  15 mg Oral Daily   levETIRAcetam  250 mg Oral BID   levothyroxine  50 mcg Oral Daily  metoprolol succinate  12.5 mg Oral QHS   mexiletine  250 mg Oral Q12H   pantoprazole  40 mg Oral Daily   potassium chloride  40 mEq Oral Once   QUEtiapine  200 mg Oral QHS   rosuvastatin  10 mg Oral Daily   sodium chloride flush  3 mL Intravenous Q12H   spironolactone  25 mg Oral Daily   umeclidinium bromide  1 puff Inhalation Daily   Continuous Infusions:   PRN Meds:.acetaminophen **OR** acetaminophen,  albuterol, nitroGLYCERIN, oxyCODONE  Antibiotics  :    Anti-infectives (From admission, onward)    None         Objective:   Vitals:   08/06/22 2135 08/07/22 0151 08/07/22 0446 08/07/22 0800  BP: 100/60 106/64 98/61 (!) 96/57  Pulse: 76 74 65 64  Resp: 20 20 20 17   Temp: 98.5 F (36.9 C) 98.4 F (36.9 C) 98.1 F (36.7 C) 98.2 F (36.8 C)  TempSrc: Oral Oral Oral Oral  SpO2: 95% 95% 92% 96%  Weight:  84.2 kg    Height:        Wt Readings from Last 3 Encounters:  08/07/22 84.2 kg  08/03/22 84.8 kg  07/31/22 84.9 kg     Intake/Output Summary (Last 24 hours) at 08/07/2022 1119 Last data filed at 08/07/2022 0851 Gross per 24 hour  Intake 717 ml  Output 2200 ml  Net -1483 ml     Physical Exam  Awake Alert, No new F.N deficits, Normal affect [place].AT,PERRAL Supple Neck, No JVD,   Symmetrical Chest wall movement, Good air movement bilaterally, CTAB RRR,No Gallops, Rubs or new Murmurs,  +ve B.Sounds, Abd Soft, No tenderness,   No Cyanosis, Clubbing or edema       Data Review:    Recent Labs  Lab 08/03/22 1935 08/04/22 0546 08/05/22 0042 08/06/22 0123 08/07/22 0115  WBC 8.2 9.2 10.9* 9.2 10.2  HGB 14.2 13.2 12.4* 12.7* 11.7*  HCT 42.6 38.5* 36.4* 37.3* 34.8*  PLT 223 217 199 183 173  MCV 87.3 86.3 86.5 87.1 87.7  MCH 29.1 29.6 29.5 29.7 29.5  MCHC 33.3 34.3 34.1 34.0 33.6  RDW 17.1* 17.2* 17.1* 17.3* 16.9*  LYMPHSABS 2.3  --  1.2 1.6 1.2  MONOABS 0.4  --  1.1* 1.2* 1.4*  EOSABS 0.4  --  0.2 0.1 0.2  BASOSABS 0.1  --  0.1 0.1 0.1    Recent Labs  Lab 08/03/22 1935 08/04/22 0153 08/04/22 0424 08/04/22 0546 08/05/22 0042 08/06/22 0123 08/07/22 0115  NA 129*  --   --  131* 130* 135 132*  K 4.0  --   --  4.7 4.6 4.5 3.9  CL 93*  --   --  97* 98 98 96*  CO2 23  --   --  22 23 27 24   ANIONGAP 13  --   --  12 9 10 12   GLUCOSE 193*  --   --  104* 167* 114* 121*  BUN 34*  --   --  32* 26* 26* 30*  CREATININE 2.28*  --   --  2.11* 1.99* 2.00*  1.97*  AST 35  --   --   --   --   --   --   ALT 21  --   --   --   --   --   --   ALKPHOS 60  --   --   --   --   --   --  BILITOT 0.7  --   --   --   --   --   --   ALBUMIN 3.6  --   --   --   --   --   --   INR  --  1.2  --   --   --   --   --   BNP 715.9*  --  681.9*  --  941.5* 1,023.0* 846.9*  MG 2.4  --   --  2.4 2.4 2.4 2.2  CALCIUM 9.1  --   --  8.9 8.9 9.2 8.8*      Radiology Reports DG Chest Port 1 View  Result Date: 08/05/2022 CLINICAL DATA:  Shortness of breath EXAM: PORTABLE CHEST 1 VIEW COMPARISON:  08/05/2022 at 6:20 a.m. FINDINGS: AICD noted. External defibrillator pads are in place. Mild enlargement of the cardiopericardial silhouette. Prominence of upper zone pulmonary vasculature may reflect pulmonary venous hypertension but is nonspecific on today's semi erect portable chest radiograph. Prosthetic mitral valve. No blunting of the costophrenic angles. There is some hazy density at the left lung base probably from subsegmental atelectasis or scarring. Left clavicular pin noted. IMPRESSION: 1. Mild enlargement of the cardiopericardial silhouette, without overt edema. 2. Prominence of the upper zone pulmonary vasculature may reflect pulmonary venous hypertension but is nonspecific on today's semi erect portable chest radiograph. 3. Prosthetic mitral valve. 4. AICD noted. Electronically Signed   By: Gaylyn Rong M.D.   On: 08/05/2022 15:34   DG Chest Port 1 View  Result Date: 08/05/2022 CLINICAL DATA:  [id number] with shortness of breath. EXAM: PORTABLE CHEST 1 VIEW COMPARISON:  Portable chest yesterday at 1:27 a.m. FINDINGS: 6:22 a.m. Left chest pacemaker/AID and wiring are stable. Overlying electrical pads are again noted. There are sternotomy sutures and mitral valve metal ring prosthesis. Moderate to severe cardiomegaly. Stable mediastinum with aortic calcific plaques. Today there is increased perihilar vascular congestion, and development of mild generalized interstitial  edema throughout the lung fields with small layering pleural effusions. No acute airspace infiltrate is seen. The lungs are otherwise clear. A fracture fixation rod is again noted in the left clavicle with healed fracture deformity. IMPRESSION: Cardiomegaly with increased perihilar vascular congestion and development of mild generalized interstitial edema and small layering pleural effusions. Findings consistent with CHF or fluid overload. Electronically Signed   By: Almira Bar M.D.   On: 08/05/2022 06:35   ECHOCARDIOGRAM COMPLETE  Result Date: 08/04/2022    ECHOCARDIOGRAM REPORT   [name] Date of Exam: 08/04/2022 Medical Rec #:  [id number]    Height:       69.0 in Accession #:    [id number]   Weight:       187.0 lb Date of Birth:  [birth year]    BSA:          2.008 m Patient Age:    61 years     BP:           114/71 mmHg Patient Gender: M            HR:           64 bpm. Exam Location:  Inpatient Procedure: 2D Echo Indications:    ventricular tachycardia  History:        Patient has prior history of Echocardiogram examinations, most                 recent 02/16/2022. Cardiomyopathy, CAD, Defibrillator, chronic  kidney disease. and COPD, Arrythmias:Paroxysmal A-Fib; Risk                 Factors:Diabetes, Current Smoker and Dyslipidemia.  Sonographer:    Delcie Roch RDCS Referring Phys: [id number] TIMOTHY S OPYD IMPRESSIONS  1. Left ventricular ejection fraction, by estimation, is 25 to 30%. The left ventricle has severely decreased function. The left ventricle demonstrates regional wall motion abnormalities (see scoring diagram/findings for description). The left ventricular internal cavity size was moderately dilated. Left ventricular diastolic function could not be evaluated.  2. Right ventricular systolic function is normal. The right ventricular size is normal. There is normal pulmonary artery systolic pressure. The estimated right ventricular systolic pressure is 31.9  mmHg.  3. Left atrial size was severely dilated.  4. The mitral valve has been repaired/replaced. Severe mitral valve regurgitation. Mild mitral stenosis. The mean mitral valve gradient is 5.5 mmHg with average heart rate of 66 bpm.  5. The aortic valve is tricuspid. There is mild thickening of the aortic valve. Aortic valve regurgitation is not visualized. Aortic valve sclerosis is present, with no evidence of aortic valve stenosis.  6. The inferior vena cava is normal in size with greater than 50% respiratory variability, suggesting right atrial pressure of 3 mmHg. Comparison(s): No significant change from prior study. Prior images reviewed side by side. Mitral insufficiency was underestimated on the previous report. FINDINGS  Left Ventricle: Left ventricular ejection fraction, by estimation, is 25 to 30%. The left ventricle has severely decreased function. The left ventricle demonstrates regional wall motion abnormalities. The left ventricular internal cavity size was moderately dilated. There is no left ventricular hypertrophy. Left ventricular diastolic function could not be evaluated due to mitral valve repair. Left ventricular diastolic function could not be evaluated.  LV Wall Scoring: The apical septal segment, apical inferior segment, and apex are dyskinetic. The mid and distal anterior wall, mid and distal lateral wall, posterior wall, and mid anterolateral segment are akinetic. The mid inferior segment is hypokinetic. The anterior septum, basal anterolateral segment, mid inferoseptal segment, basal anterior segment, basal inferior segment, and basal inferoseptal segment are normal. Right Ventricle: The right ventricular size is normal. No increase in right ventricular wall thickness. Right ventricular systolic function is normal. There is normal pulmonary artery systolic pressure. The tricuspid regurgitant velocity is 2.69 m/s, and  with an assumed right atrial pressure of 3 mmHg, the estimated right  ventricular systolic pressure is 31.9 mmHg. Left Atrium: Left atrial size was severely dilated. Right Atrium: Right atrial size was normal in size. Pericardium: There is no evidence of pericardial effusion. Mitral Valve: The mitral valve has been repaired/replaced. Severe mitral valve regurgitation. There is a prosthetic annuloplasty ring present in the mitral position. Mild mitral valve stenosis. MV peak gradient, 19.5 mmHg. The mean mitral valve gradient is 5.5 mmHg with average heart rate of 66 bpm. Tricuspid Valve: The tricuspid valve is normal in structure. Tricuspid valve regurgitation is trivial. Aortic Valve: The aortic valve is tricuspid. There is mild thickening of the aortic valve. Aortic valve regurgitation is not visualized. Aortic valve sclerosis is present, with no evidence of aortic valve stenosis. Pulmonic Valve: The pulmonic valve was normal in structure. Pulmonic valve regurgitation is not visualized. Aorta: The aortic root and ascending aorta are structurally normal, with no evidence of dilitation. Venous: The inferior vena cava is normal in size with greater than 50% respiratory variability, suggesting right atrial pressure of 3 mmHg. IAS/Shunts: No atrial level shunt detected by color flow  Doppler. Additional Comments: A device lead is visualized in the right ventricle and right atrium.  LEFT VENTRICLE PLAX 2D LVIDd:         6.60 cm   Diastology LVIDs:         5.50 cm   LV e' medial:  4.57 cm/s LV PW:         1.10 cm   LV e' lateral: 4.13 cm/s LV IVS:        1.10 cm LVOT diam:     2.00 cm LV SV:         52 LV SV Index:   26 LVOT Area:     3.14 cm  RIGHT VENTRICLE          IVC RV Basal diam:  2.90 cm  IVC diam: 1.70 cm TAPSE (M-mode): 1.4 cm LEFT ATRIUM              Index        RIGHT ATRIUM           Index LA diam:        5.70 cm  2.84 cm/m   RA Area:     17.40 cm LA Vol (A2C):   112.0 ml 55.78 ml/m  RA Volume:   42.60 ml  21.22 ml/m LA Vol (A4C):   111.0 ml 55.28 ml/m LA Biplane Vol:  115.0 ml 57.28 ml/m  AORTIC VALVE LVOT Vmax:   76.50 cm/s LVOT Vmean:  47.500 cm/s LVOT VTI:    0.166 m  AORTA Ao Root diam: 3.20 cm Ao Asc diam:  3.50 cm MITRAL VALVE              TRICUSPID VALVE MV Area (PHT): 2.52 cm   TR Peak grad:   28.9 mmHg MV Area VTI:   0.97 cm   TR Vmax:        269.00 cm/s MV Peak grad:  19.5 mmHg MV Mean grad:  5.5 mmHg   SHUNTS MV Vmax:       2.21 m/s   Systemic VTI:  0.17 m MV Vmean:      99.9 cm/s  Systemic Diam: 2.00 cm MV Decel Time: 301 msec MR Peak grad: 64.6 mmHg MR Mean grad: 40.0 mmHg MR Vmax:      402.00 cm/s MR Vmean:     297.0 cm/s Rachelle Hora Croitoru MD Electronically signed by Thurmon Fair MD Signature Date/Time: 08/04/2022/4:38:32 PM    Final    US RENAL  Result Date: 08/04/2022 CLINICAL DATA:  Acute renal failure EXAM: RENAL / URINARY TRACT ULTRASOUND COMPLETE COMPARISON:  CT scan 03/14/2022 FINDINGS: Right Kidney: Renal measurements: 9.7 x 5.2 x 5.5 cm = volume: 145 mL. Echogenicity within normal limits. No mass or hydronephrosis visualized. Left Kidney: Renal measurements: 11.6 x 5.4 x 4.5 cm = volume: 148 mL. 15 mm cyst identified towards the upper pole, similar to prior CT. 13 mm interpolar subcapsular cyst evident, also present on prior CT although more subtle. No hydronephrosis. Bladder: Appears normal for degree of bladder distention. Other: Liver parenchyma appears echogenic suggesting fatty deposition. IMPRESSION: No hydronephrosis. Sonographic features suggesting hepatic steatosis. Electronically Signed   By: Kennith Center M.D.   On: 08/04/2022 06:49   DG Chest Portable 1 View  Result Date: 08/04/2022 CLINICAL DATA:  Chest pain and tachycardia EXAM: PORTABLE CHEST 1 VIEW COMPARISON:  05/22/2022 FINDINGS: Cardiac shadow is enlarged but stable. Postsurgical changes are seen. Defibrillator is again noted. Lungs are well aerated without focal infiltrate or  sizable effusion. No acute bony abnormality is seen. Prior fixation of the left clavicle is again seen.  IMPRESSION: No acute abnormality noted. Electronically Signed   By: Inez Catalina M.D.   On: 08/04/2022 01:46      Signature  -   Lala Lund M.D on 08/07/2022 at 11:19 AM   -  To page go to [email or web address]

## 2022-08-08 DIAGNOSIS — I472 Ventricular tachycardia, unspecified: Secondary | ICD-10-CM | POA: Diagnosis not present

## 2022-08-08 LAB — GLUCOSE, CAPILLARY
Glucose-Capillary: 100 mg/dL — ABNORMAL HIGH (ref 70–99)
Glucose-Capillary: 100 mg/dL — ABNORMAL HIGH (ref 70–99)
Glucose-Capillary: 144 mg/dL — ABNORMAL HIGH (ref 70–99)
Glucose-Capillary: 138 mg/dL — ABNORMAL HIGH (ref 70–99)

## 2022-08-08 MED ORDER — MEXILETINE HCL 250 MG PO CAPS: 250.0000 mg | ORAL_CAPSULE | Freq: Two times a day (BID) | ORAL | Status: DC

## 2022-08-08 MED ORDER — AMIODARONE HCL 400 MG PO TABS: ORAL_TABLET | ORAL | Status: DC

## 2022-08-08 MED ORDER — ISOSORBIDE MONONITRATE ER 30 MG PO TB24: 15.0000 mg | ORAL_TABLET | Freq: Every day | ORAL | Status: DC

## 2022-08-08 NOTE — Plan of Care (Signed)
  Problem: Education: Goal: Ability to describe self-care measures that may prevent or decrease complications (Diabetes Survival Skills Education) will improve Outcome: Progressing Goal: Individualized Educational Video(s) Outcome: Progressing   Problem: Coping: Goal: Ability to adjust to condition or change in health will improve Outcome: Progressing   Problem: Fluid Volume: Goal: Ability to maintain a balanced intake and output will improve Outcome: Progressing   Problem: Health Behavior/Discharge Planning: Goal: Ability to identify and utilize available resources and services will improve Outcome: Progressing Goal: Ability to manage health-related needs will improve Outcome: Progressing   Problem: Metabolic: Goal: Ability to maintain appropriate glucose levels will improve Outcome: Progressing   Problem: Nutritional: Goal: Maintenance of adequate nutrition will improve Outcome: Progressing Goal: Progress toward achieving an optimal weight will improve Outcome: Progressing   Problem: Skin Integrity: Goal: Risk for impaired skin integrity will decrease Outcome: Progressing   Problem: Tissue Perfusion: Goal: Adequacy of tissue perfusion will improve Outcome: Progressing   Problem: Education: Goal: Knowledge of General Education information will improve Description: Including pain rating scale, medication(s)/side effects and non-pharmacologic comfort measures Outcome: Progressing   Problem: Health Behavior/Discharge Planning: Goal: Ability to manage health-related needs will improve Outcome: Progressing   Problem: Clinical Measurements: Goal: Ability to maintain clinical measurements within normal limits will improve Outcome: Progressing Goal: Will remain free from infection Outcome: Progressing Goal: Diagnostic test results will improve Outcome: Progressing Goal: Respiratory complications will improve Outcome: Progressing Goal: Cardiovascular complication will  be avoided Outcome: Progressing   Problem: Activity: Goal: Risk for activity intolerance will decrease Outcome: Progressing   Problem: Nutrition: Goal: Adequate nutrition will be maintained Outcome: Progressing   Problem: Coping: Goal: Level of anxiety will decrease Outcome: Progressing   Problem: Elimination: Goal: Will not experience complications related to bowel motility Outcome: Progressing Goal: Will not experience complications related to urinary retention Outcome: Progressing   Problem: Pain Managment: Goal: General experience of comfort will improve Outcome: Progressing   Problem: Safety: Goal: Ability to remain free from injury will improve Outcome: Progressing   Problem: Skin Integrity: Goal: Risk for impaired skin integrity will decrease Outcome: Progressing   Problem: Education: Goal: Ability to demonstrate management of disease process will improve Outcome: Progressing Goal: Ability to verbalize understanding of medication therapies will improve Outcome: Progressing Goal: Individualized Educational Video(s) Outcome: Progressing   Problem: Activity: Goal: Capacity to carry out activities will improve Outcome: Progressing   Problem: Cardiac: Goal: Ability to achieve and maintain adequate cardiopulmonary perfusion will improve Outcome: Progressing

## 2022-08-08 NOTE — TOC Progression Note (Addendum)
Transition of Care [facility]) - Progression Note    Patient Details  Name: Carl [name] MRN: [id number] Date of Birth: 1961/02/06  Transition of Care [facility]) CM/SW Contact  Loletha Grayer Beverely Pace, RN Phone Number: 08/08/2022, 12:49 PM  Clinical Narrative:     Case Manager contacted Shirlee Limerick at [facility], [phone], they do not have oxygen at facility. CM contacted Jermaine with [employer] and requested oxygen concentrator and tanks be delivered to [employer]. He will notify CM when it has been setup. Bedside RN updated.    1533: oxygen has been delivered to the ALF, confirmed by [facility] and I called Shirlee Limerick at the ALF. Nurse needs to call report to Burton at [phone]. [facility] has been called. CM notified. RN.    Expected Discharge Plan: Assisted Living Barriers to Discharge: Continued Medical Work up  Expected Discharge Plan and Services In-house Referral: NA Discharge Planning Services: CM Consult Post Acute Care Choice: Durable Medical Equipment Living arrangements for the past 2 months: [place] Expected Discharge Date: 08/08/22               DME Arranged: Oxygen DME Agency: [employer] Date DME Agency Contacted: 08/08/22 Time DME Agency Contacted: [id number] Representative spoke with at DME Agency: Brenton Grills HH Arranged: NA           Social Determinants of Health ([place]) Interventions SDOH Screenings   Food Insecurity: No Food Insecurity (08/07/2022)  Housing: Low Risk  (08/07/2022)  Transportation Needs: No Transportation Needs (08/07/2022)  Utilities: Not At Risk (08/07/2022)  Alcohol Screen: Low Risk  (05/15/2019)  Tobacco Use: High Risk (08/04/2022)    Readmission Risk Interventions    08/05/2022    8:36 AM 03/15/2022    2:44 PM  Readmission Risk Prevention Plan  Transportation Screening Complete   HRI or [place]  Complete  Social Work Consult for [place] Planning/Counseling  Complete  Palliative Care  Screening  Not Applicable  Medication Review Press photographer) Complete Complete  PCP or Specialist appointment within 3-5 days of discharge Complete   HRI or [place] Complete   [place] Not Applicable

## 2022-08-08 NOTE — Progress Notes (Signed)
Physical Therapy Treatment Patient Details Name: Carl [name] MRN: [id number] DOB: 1961/03/11 Today's Date: 08/08/2022   History of Present Illness 59yoM who was admitted from [facility] on 1/28 after recent admissions, now referred to hosp for CHF, elevation of troponin and ventricular tachycardia.  Had chest pain, and was given synchronized cardioversion then to [facility].  PMH: COPD, CHF, BPD, PAF, seizure DO, CVA. AiCD pacer, bipolar, smoker, stroke, DM, hypothyroidism,    PT Comments    Pt at supervision level for short distance mobility at this time, pt limited by safety awareness so still requires cues for form/safety with use of RW. Pt plans to d/c back to ALF today, pt with no further questions for actue PT.    Recommendations for follow up therapy are one component of a multi-disciplinary discharge planning process, led by the attending physician.  Recommendations may be updated based on patient status, additional functional criteria and insurance authorization.  Follow Up Recommendations  Home health PT     Assistance Recommended at Discharge Intermittent Supervision/Assistance  Patient can return home with the following A little help with walking and/or transfers;A little help with bathing/dressing/bathroom   Equipment Recommendations  None recommended by PT    Recommendations for Other Services       Precautions / Restrictions Precautions Precautions: Fall Restrictions Weight Bearing Restrictions: No     Mobility  Bed Mobility Overal bed mobility: Needs Assistance Bed Mobility: Supine to Sit, Sit to Supine     Supine to sit: Supervision Sit to supine: Supervision        Transfers Overall transfer level: Needs assistance Equipment used: Rolling walker (2 wheels) Transfers: Sit to/from Stand Sit to Stand: Supervision           General transfer comment: for safety, cues for safe hand placement    Ambulation/Gait Ambulation/Gait assistance:  Supervision Gait Distance (Feet): 40 Feet Assistive device: Rolling walker (2 wheels) Gait Pattern/deviations: Step-through pattern, Decreased stride length, Shuffle Gait velocity: decr     General Gait Details: cues for increasing foot clearance   Stairs             Wheelchair Mobility    Modified Rankin (Stroke Patients Only)       Balance Overall balance assessment: Needs assistance Sitting-balance support: Feet supported Sitting balance-Leahy Scale: Fair     Standing balance support: Bilateral upper extremity supported, During functional activity Standing balance-Leahy Scale: Fair Standing balance comment: less than fair dynamically                            Cognition Arousal/Alertness: Awake/alert Behavior During Therapy: Impulsive, Anxious Overall Cognitive Status: No family/caregiver present to determine baseline cognitive functioning                                          Exercises      General Comments General comments (skin integrity, edema, etc.): assist for getting dressed, planning to d/c today. PT reviewed the importance of wearing home O2 as prescribed, pt states he does not want to      Pertinent Vitals/Pain Pain Assessment Pain Assessment: No/denies pain    Home Living                          Prior Function  PT Goals (current goals can now be found in the care plan section) Acute Rehab PT Goals Patient Stated Goal: none stated PT Goal Formulation: Patient unable to participate in goal setting Time For Goal Achievement: 09/09/22 Potential to Achieve Goals: Good Progress towards PT goals: Progressing toward goals    Frequency    Min 3X/week      PT Plan Current plan remains appropriate    Co-evaluation              AM-PAC PT "6 Clicks" Mobility   Outcome Measure  Help needed turning from your back to your side while in a flat bed without using bedrails?:  None Help needed moving from lying on your back to sitting on the side of a flat bed without using bedrails?: None Help needed moving to and from a bed to a chair (including a wheelchair)?: None Help needed standing up from a chair using your arms (e.g., wheelchair or bedside chair)?: A Little Help needed to walk in hospital room?: A Little Help needed climbing 3-5 steps with a railing? : A Little 6 Click Score: 21    End of Session Equipment Utilized During Treatment: Oxygen Activity Tolerance: Patient tolerated treatment well Patient left: in bed;with call bell/phone within reach;with bed alarm set Nurse Communication: Mobility status PT Visit Diagnosis: Unsteadiness on feet (R26.81);Muscle weakness (generalized) (M62.81);Difficulty in walking, not elsewhere classified (R26.2)     Time: 5188-4166 PT Time Calculation (min) (ACUTE ONLY): 20 min  Charges:  $Therapeutic Activity: 8-22 mins                     Stacie Glaze, PT DPT Acute Rehabilitation Services Pager [phone]  Office [phone]    Louis Matte 08/08/2022, 5:31 PM

## 2022-08-08 NOTE — Discharge Summary (Signed)
Carl [name] [id number] DOB: Sep 04, 1960 DOA: 08/04/2022  PCP: [facility]  Admit date: 08/04/2022  Discharge date: 08/08/2022  Admitted From: SNF   Disposition:  SNF   Recommendations for Outpatient Follow-up:   Follow up with PCP in 1-2 weeks  PCP Please obtain BMP/CBC, 2 view CXR in 1week,  (see Discharge instructions)   PCP Please follow up on the following pending results: Monitor BMP, magnesium, CBC, check in 5 to 7 days.  Currently on 2 L nasal cannula oxygen continue.   Home Health: None   Equipment/Devices: None  Consultations: EP Discharge Condition: Stable    CODE STATUS: Full    Diet Recommendation: Heart Healthy with strict 1.5 L fluid restriction per day    Chief Complaint  Patient presents with   Chest Pain     Brief history of present illness from the day of admission and additional interim summary    62 y.o. male with medical history significant for hypertension, type 2 diabetes mellitus, bipolar disorder, CAD status post CABG, history of seizure, history of stroke, chronic systolic CHF EF [id number] has biventricular pacemaker with AICD and atrial fibrillation on Eliquis who presents emergency department for evaluation of ventricular tachycardia.  He lives at an SNF where he went out to smoke a cigarette on the day of admission and developed some chest discomfort when EMS arrived he was found to be in V. tach and brought to the ER.                                                                  Hospital Course   1. Ventricular tachycardia in a patient with chronic ischemic cardiomyopathy with chronic systolic heart failure EF around 30% has biventricular pacemaker/AICD device in place - Severe MR.   He is chronically on oral amiodarone, seen by cardiology in the ER and placed on  amiodarone 400 twice daily for a week thereafter 200 twice daily + Mexitil, currently blood pressure low and in AKI hence cannot do beta-blocker, ACE/ARB or Entresto. Currently chest pain-free and symptom-free, stable electrolytes.  Repeat echocardiogram noted.  Cleared by cardiology for discharge with close outpatient follow-up with EP physician Dr. Hanley Hays within 1 to 2 weeks of discharge.   2. AKI superimposed on CKD IIIa  - - SCr is 2.28 on admission, slight creatinine is under 1.5, improving, continue diuresis and close monitoring of electrolytes at SNF.  Avoid ACE ARB/Entresto till renal function comes back to baseline and blood pressure improved.   3. Acute on Chronic HFrEF 30% on recent echocardiogram, has AICD with biventricular pacemaker.  Went into Pulm edema evening of 11/04/2022 required BiPAP for 12 hours along with IV Lasix, now stable transition to nasal cannula oxygen 1-1/2 to 2 L and symptom-free, continue diuretics at home dose, continue  2 L nasal cannula oxygen for now at SNF.  Advance activity titrate down oxygen as he improves.  Strict 1.5 L fluid restriction per day.  Will be discharged on home dose Lasix along with low-dose beta-blocker.  Monitor blood pressure and electrolytes closely.   4. PAF with [place] vas 2 score of greater than 3.  Continue Eliqius and low-dose beta-blocker with close monitoring of blood pressure.   5. Elevated troponin; CAD - No anginal complaints, troponin trend is flat and in non-ACS pattern.  Continue statin for secondary prevention, beta-blocker held due to low blood pressures here along with ACE inhibitor, SNF MD to monitor and rechallenge if blood pressure allows with beta-blocker and ACE inhibitor as appropriate, on Eliquis, cardiology on board.  Chest pain-free.    6. Bipolar disorder  - Continue Lexapro, Atarax, and Seroquel     7. Hx of seizures  - Continue Depakote and Keppra     8.  Hypothyroidism - Continue Synthroid    9. Type II DM  - A1c  was 6.6% in September 2023, continue home regimen and follow-up with PCP for glycemic control.  Discharge diagnosis     Principal Problem:   Ventricular tachycardia (HCC) Active Problems:   Hypothyroidism   Elevated troponin   Acute renal failure superimposed on stage 3a chronic kidney disease (HCC)   Bipolar 1 disorder (HCC)   Paroxysmal atrial fibrillation (HCC)   Type 2 diabetes mellitus with hyperglycemia (HCC)   CAD in native artery   History of seizure   History of stroke   HFrEF (heart failure with reduced ejection fraction) [facility])    Discharge instructions    Discharge Instructions     Discharge instructions   Complete by: As directed    Follow with Primary MD [facility] in 7 days   Get CBC, CMP, Magnesium, 2 view Chest X ray -  checked next visit with your primary MD or SNF MD    Activity: As tolerated with Full fall precautions use walker/cane & assistance as needed  Disposition SNF  Diet: Heart Healthy with strict 1.5 L fluid restriction per day.  Check your Weight same time everyday, if you gain over 2 pounds, or you develop in leg swelling, experience more shortness of breath or chest pain, call your Primary MD immediately. Follow Cardiac Low Salt Diet and 1.5 lit/day fluid restriction.  Special Instructions: If you have smoked or chewed Tobacco  in the last 2 yrs please stop smoking, stop any regular Alcohol  and or any Recreational drug use.  On your next visit with your primary care physician please Get Medicines reviewed and adjusted.  Please request your Prim.MD to go over all Hospital Tests and Procedure/Radiological results at the follow up, please get all Hospital records sent to your Prim MD by signing hospital release before you go home.  If you experience worsening of your admission symptoms, develop shortness of breath, life threatening emergency, suicidal or homicidal thoughts you must seek medical attention immediately by calling  911 or calling your MD immediately  if symptoms less severe.  You Must read complete instructions/literature along with all the possible adverse reactions/side effects for all the Medicines you take and that have been prescribed to you. Take any new Medicines after you have completely understood and accpet all the possible adverse reactions/side effects.   Increase activity slowly   Complete by: As directed        Discharge Medications   Allergies as of 08/08/2022  Reactions   Iodinated Contrast Media Shortness Of Breath   Penicillins Anaphylaxis, Shortness Of Breath   Respiratory  Tolerated cefuroxime on 01/06/16   Strawberry Extract Anaphylaxis   Cefepime Itching   Empiric antibiotic, developed pruritis.    Erythromycin Itching   Sulfa Antibiotics Itching, Nausea And Vomiting, Nausea Only        Medication List     STOP taking these medications    losartan 25 MG tablet Commonly known as: COZAAR       TAKE these medications    acetaminophen 500 MG tablet Commonly known as: TYLENOL Take 500 mg by mouth every 6 (six) hours as needed for mild pain.   albuterol 108 (90 Base) MCG/ACT inhaler Commonly known as: VENTOLIN HFA Inhale 2 puffs into the lungs every 6 (six) hours as needed for wheezing.   albuterol (2.5 MG/3ML) 0.083% nebulizer solution Commonly known as: PROVENTIL Take 3 mLs (2.5 mg total) by nebulization every 6 (six) hours as needed for wheezing or shortness of breath.   amiodarone 400 MG tablet Commonly known as: PACERONE Take 400 mg twice a day for 6 more days thereafter 200 mg twice a day and follow-up with EP physician Dr. Curt Bears within 1 to 2 weeks. What changed:  how much to take how to take this when to take this additional instructions   dapagliflozin propanediol 10 MG Tabs tablet Commonly known as: FARXIGA Take 1 tablet (10 mg total) by mouth daily.   divalproex 500 MG 24 hr tablet Commonly known as: DEPAKOTE ER Take 1 tablet (500  mg total) by mouth 2 (two) times daily.   Eliquis 5 MG Tabs tablet Generic drug: apixaban TAKE 1 TABLET BY MOUTH TWICE A DAY What changed: how much to take   escitalopram 10 MG tablet Commonly known as: LEXAPRO Take 1 tablet (10 mg total) by mouth at bedtime.   ezetimibe 10 MG tablet Commonly known as: ZETIA Take 1 tablet (10 mg total) by mouth daily.   furosemide 20 MG tablet Commonly known as: LASIX Take 3 tablets (60 mg total) by mouth 2 (two) times daily.   HYDROcodone-acetaminophen 5-325 MG tablet Commonly known as: NORCO/VICODIN Take 1 tablet by mouth every 6 (six) hours as needed for severe pain.   Hydrocortisone Acetate 1 % Crea Apply 1 Application topically 3 (three) times daily as needed (itching).   hydrOXYzine 10 MG tablet Commonly known as: ATARAX Take 10 mg by mouth 2 (two) times daily.   isosorbide mononitrate 30 MG 24 hr tablet Commonly known as: IMDUR Take 0.5 tablets (15 mg total) by mouth daily. Start taking on: August 09, 2022   levETIRAcetam 250 MG tablet Commonly known as: Keppra Take 1 tablet (250 mg total) by mouth 2 (two) times daily.   levothyroxine 25 MCG tablet Commonly known as: SYNTHROID Take 1 tablet (25 mcg total) by mouth daily. What changed: how much to take   metoprolol succinate 25 MG 24 hr tablet Commonly known as: TOPROL-XL Take 0.5 tablets (12.5 mg total) by mouth at bedtime.   mexiletine 250 MG capsule Commonly known as: MEXITIL Take 1 capsule (250 mg total) by mouth every 12 (twelve) hours.   multivitamin with minerals Tabs tablet Take 1 tablet by mouth daily.   nitroGLYCERIN 0.4 MG SL tablet Commonly known as: NITROSTAT Place 1 tablet (0.4 mg total) under the tongue every 5 (five) minutes as needed for chest pain.   omeprazole 20 MG capsule Commonly known as: PRILOSEC Take 20 mg by mouth  daily.   potassium chloride SA 20 MEQ tablet Commonly known as: KLOR-CON M Take 2 tablets (40 mEq total) by mouth daily.    QUEtiapine 200 MG tablet Commonly known as: SEROQUEL Take 200 mg by mouth at bedtime.   rosuvastatin 10 MG tablet Commonly known as: CRESTOR Take 1 tablet (10 mg total) by mouth daily. What changed: when to take this   spironolactone 25 MG tablet Commonly known as: ALDACTONE Take 1 tablet (25 mg total) by mouth daily.   tiotropium 18 MCG inhalation capsule Commonly known as: SPIRIVA Place 18 mcg into inhaler and inhale daily.   traMADol 50 MG tablet Commonly known as: ULTRAM Take 50 mg by mouth every 12 (twelve) hours as needed for moderate pain.         Follow-up Information     [facility] Schedule an appointment as soon as possible for a visit in 1 week(s).   Specialty: Geriatric Medicine Contact information: [address] [place] [address] [phone]         Regan Lemming, MD. Schedule an appointment as soon as possible for a visit in 1 week(s).   Specialty: Cardiology Contact information: [address] [place] [address] [phone]                 Major procedures and Radiology Reports - PLEASE review detailed and final reports thoroughly  -       DG Chest Port 1 View  Result Date: 08/05/2022 CLINICAL DATA:  Shortness of breath EXAM: PORTABLE CHEST 1 VIEW COMPARISON:  08/05/2022 at 6:20 a.m. FINDINGS: AICD noted. External defibrillator pads are in place. Mild enlargement of the cardiopericardial silhouette. Prominence of upper zone pulmonary vasculature may reflect pulmonary venous hypertension but is nonspecific on today's semi erect portable chest radiograph. Prosthetic mitral valve. No blunting of the costophrenic angles. There is some hazy density at the left lung base probably from subsegmental atelectasis or scarring. Left clavicular pin noted. IMPRESSION: 1. Mild enlargement of the cardiopericardial silhouette, without overt edema. 2. Prominence of the upper zone pulmonary vasculature  may reflect pulmonary venous hypertension but is nonspecific on today's semi erect portable chest radiograph. 3. Prosthetic mitral valve. 4. AICD noted. Electronically Signed   By: Gaylyn Rong M.D.   On: 08/05/2022 15:34   DG Chest Port 1 View  Result Date: 08/05/2022 CLINICAL DATA:  [id number] with shortness of breath. EXAM: PORTABLE CHEST 1 VIEW COMPARISON:  Portable chest yesterday at 1:27 a.m. FINDINGS: 6:22 a.m. Left chest pacemaker/AID and wiring are stable. Overlying electrical pads are again noted. There are sternotomy sutures and mitral valve metal ring prosthesis. Moderate to severe cardiomegaly. Stable mediastinum with aortic calcific plaques. Today there is increased perihilar vascular congestion, and development of mild generalized interstitial edema throughout the lung fields with small layering pleural effusions. No acute airspace infiltrate is seen. The lungs are otherwise clear. A fracture fixation rod is again noted in the left clavicle with healed fracture deformity. IMPRESSION: Cardiomegaly with increased perihilar vascular congestion and development of mild generalized interstitial edema and small layering pleural effusions. Findings consistent with CHF or fluid overload. Electronically Signed   By: Almira Bar M.D.   On: 08/05/2022 06:35   ECHOCARDIOGRAM COMPLETE  Result Date: 08/04/2022    ECHOCARDIOGRAM REPORT   Patient Name:   ZAAHIR PICKNEY Date of Exam: 08/04/2022 Medical Rec #:  [id number]    Height:       69.0 in Accession #:  [id number]   Weight:       187.0 lb Date of Birth:  1960-12-12    BSA:          2.008 m Patient Age:    44 years     BP:           114/71 mmHg Patient Gender: M            HR:           64 bpm. Exam Location:  Inpatient Procedure: 2D Echo Indications:    ventricular tachycardia  History:        Patient has prior history of Echocardiogram examinations, most                 recent 02/16/2022. Cardiomyopathy, CAD, Defibrillator, chronic                  kidney disease. and COPD, Arrythmias:Paroxysmal A-Fib; Risk                 Factors:Diabetes, Current Smoker and Dyslipidemia.  Sonographer:    Johny Chess RDCS Referring Phys: [id number] [place]  1. Left ventricular ejection fraction, by estimation, is 25 to 30%. The left ventricle has severely decreased function. The left ventricle demonstrates regional wall motion abnormalities (see scoring diagram/findings for description). The left ventricular internal cavity size was moderately dilated. Left ventricular diastolic function could not be evaluated.  2. Right ventricular systolic function is normal. The right ventricular size is normal. There is normal pulmonary artery systolic pressure. The estimated right ventricular systolic pressure is 37.6 mmHg.  3. Left atrial size was severely dilated.  4. The mitral valve has been repaired/replaced. Severe mitral valve regurgitation. Mild mitral stenosis. The mean mitral valve gradient is 5.5 mmHg with average heart rate of 66 bpm.  5. The aortic valve is tricuspid. There is mild thickening of the aortic valve. Aortic valve regurgitation is not visualized. Aortic valve sclerosis is present, with no evidence of aortic valve stenosis.  6. The inferior vena cava is normal in size with greater than 50% respiratory variability, suggesting right atrial pressure of 3 mmHg. Comparison(s): No significant change from prior study. Prior images reviewed side by side. Mitral insufficiency was underestimated on the previous report. FINDINGS  Left Ventricle: Left ventricular ejection fraction, by estimation, is 25 to 30%. The left ventricle has severely decreased function. The left ventricle demonstrates regional wall motion abnormalities. The left ventricular internal cavity size was moderately dilated. There is no left ventricular hypertrophy. Left ventricular diastolic function could not be evaluated due to mitral valve repair. Left ventricular diastolic function  could not be evaluated.  LV Wall Scoring: The apical septal segment, apical inferior segment, and apex are dyskinetic. The mid and distal anterior wall, mid and distal lateral wall, posterior wall, and mid anterolateral segment are akinetic. The mid inferior segment is hypokinetic. The anterior septum, basal anterolateral segment, mid inferoseptal segment, basal anterior segment, basal inferior segment, and basal inferoseptal segment are normal. Right Ventricle: The right ventricular size is normal. No increase in right ventricular wall thickness. Right ventricular systolic function is normal. There is normal pulmonary artery systolic pressure. The tricuspid regurgitant velocity is 2.69 m/s, and  with an assumed right atrial pressure of 3 mmHg, the estimated right ventricular systolic pressure is 28.3 mmHg. Left Atrium: Left atrial size was severely dilated. Right Atrium: Right atrial size was normal in size. Pericardium: There is no evidence of pericardial effusion. Mitral Valve: The mitral  valve has been repaired/replaced. Severe mitral valve regurgitation. There is a prosthetic annuloplasty ring present in the mitral position. Mild mitral valve stenosis. MV peak gradient, 19.5 mmHg. The mean mitral valve gradient is 5.5 mmHg with average heart rate of 66 bpm. Tricuspid Valve: The tricuspid valve is normal in structure. Tricuspid valve regurgitation is trivial. Aortic Valve: The aortic valve is tricuspid. There is mild thickening of the aortic valve. Aortic valve regurgitation is not visualized. Aortic valve sclerosis is present, with no evidence of aortic valve stenosis. Pulmonic Valve: The pulmonic valve was normal in structure. Pulmonic valve regurgitation is not visualized. Aorta: The aortic root and ascending aorta are structurally normal, with no evidence of dilitation. Venous: The inferior vena cava is normal in size with greater than 50% respiratory variability, suggesting right atrial pressure of 3 mmHg.  IAS/Shunts: No atrial level shunt detected by color flow Doppler. Additional Comments: A device lead is visualized in the right ventricle and right atrium.  LEFT VENTRICLE PLAX 2D LVIDd:         6.60 cm   Diastology LVIDs:         5.50 cm   LV e' medial:  4.57 cm/s LV PW:         1.10 cm   LV e' lateral: 4.13 cm/s LV IVS:        1.10 cm LVOT diam:     2.00 cm LV SV:         52 LV SV Index:   26 LVOT Area:     3.14 cm  RIGHT VENTRICLE          IVC RV Basal diam:  2.90 cm  IVC diam: 1.70 cm TAPSE (M-mode): 1.4 cm LEFT ATRIUM              Index        RIGHT ATRIUM           Index LA diam:        5.70 cm  2.84 cm/m   RA Area:     17.40 cm LA Vol (A2C):   112.0 ml 55.78 ml/m  RA Volume:   42.60 ml  21.22 ml/m LA Vol (A4C):   111.0 ml 55.28 ml/m LA Biplane Vol: 115.0 ml 57.28 ml/m  AORTIC VALVE LVOT Vmax:   76.50 cm/s LVOT Vmean:  47.500 cm/s LVOT VTI:    0.166 m  AORTA Ao Root diam: 3.20 cm Ao Asc diam:  3.50 cm MITRAL VALVE              TRICUSPID VALVE MV Area (PHT): 2.52 cm   TR Peak grad:   28.9 mmHg MV Area VTI:   0.97 cm   TR Vmax:        269.00 cm/s MV Peak grad:  19.5 mmHg MV Mean grad:  5.5 mmHg   SHUNTS MV Vmax:       2.21 m/s   Systemic VTI:  0.17 m MV Vmean:      99.9 cm/s  Systemic Diam: 2.00 cm MV Decel Time: 301 msec MR Peak grad: 64.6 mmHg MR Mean grad: 40.0 mmHg MR Vmax:      402.00 cm/s MR Vmean:     297.0 cm/s Dani Gobble Croitoru MD Electronically signed by Sanda Klein MD Signature Date/Time: 08/04/2022/4:38:32 PM    Final    US RENAL  Result Date: 08/04/2022 CLINICAL DATA:  Acute renal failure EXAM: RENAL / URINARY TRACT ULTRASOUND COMPLETE COMPARISON:  CT scan 03/14/2022 FINDINGS: Right  Kidney: Renal measurements: 9.7 x 5.2 x 5.5 cm = volume: 145 mL. Echogenicity within normal limits. No mass or hydronephrosis visualized. Left Kidney: Renal measurements: 11.6 x 5.4 x 4.5 cm = volume: 148 mL. 15 mm cyst identified towards the upper pole, similar to prior CT. 13 mm interpolar subcapsular cyst  evident, also present on prior CT although more subtle. No hydronephrosis. Bladder: Appears normal for degree of bladder distention. Other: Liver parenchyma appears echogenic suggesting fatty deposition. IMPRESSION: No hydronephrosis. Sonographic features suggesting hepatic steatosis. Electronically Signed   By: Misty Stanley M.D.   On: 08/04/2022 06:49   DG Chest Portable 1 View  Result Date: 08/04/2022 CLINICAL DATA:  Chest pain and tachycardia EXAM: PORTABLE CHEST 1 VIEW COMPARISON:  05/22/2022 FINDINGS: Cardiac shadow is enlarged but stable. Postsurgical changes are seen. Defibrillator is again noted. Lungs are well aerated without focal infiltrate or sizable effusion. No acute bony abnormality is seen. Prior fixation of the left clavicle is again seen. IMPRESSION: No acute abnormality noted. Electronically Signed   By: Inez Catalina M.D.   On: 08/04/2022 01:46   CUP PACEART REMOTE DEVICE CHECK  Result Date: 08/02/2022 Scheduled remote reviewed. Normal device function.  Optivol crossed threshold 1/17 and is ongoing Next remote 91 days. LA     Today   Subjective    Carl [name] today has no headache,no chest abdominal pain,no new weakness tingling or numbness, feels much better wants to go home today.     Objective   Blood pressure (!) 97/59, pulse 67, temperature 97.9 F (36.6 C), temperature source Oral, resp. rate 20, height 5\' 9"  (1.753 m), weight 83.4 kg, SpO2 93 %.   Intake/Output Summary (Last 24 hours) at 08/08/2022 0944 Last data filed at 08/08/2022 0830 Gross per 24 hour  Intake 1312 ml  Output 1300 ml  Net 12 ml    Exam  Awake Alert, No new F.N deficits,    [place].AT,PERRAL Supple Neck,   Symmetrical Chest wall movement, Good air movement bilaterally, CTAB RRR,No Gallops,   +ve B.Sounds, Abd Soft, Non tender,  No Cyanosis, Clubbing or edema    Data Review   Recent Labs  Lab 08/03/22 1935 08/04/22 0546 08/05/22 0042 08/06/22 0123 08/07/22 0115  WBC 8.2 9.2 10.9*  9.2 10.2  HGB 14.2 13.2 12.4* 12.7* 11.7*  HCT 42.6 38.5* 36.4* 37.3* 34.8*  PLT 223 217 199 183 173  MCV 87.3 86.3 86.5 87.1 87.7  MCH 29.1 29.6 29.5 29.7 29.5  MCHC 33.3 34.3 34.1 34.0 33.6  RDW 17.1* 17.2* 17.1* 17.3* 16.9*  LYMPHSABS 2.3  --  1.2 1.6 1.2  MONOABS 0.4  --  1.1* 1.2* 1.4*  EOSABS 0.4  --  0.2 0.1 0.2  BASOSABS 0.1  --  0.1 0.1 0.1    Recent Labs  Lab 08/03/22 1935 08/04/22 0153 08/04/22 0424 08/04/22 0546 08/05/22 0042 08/06/22 0123 08/07/22 0115  NA 129*  --   --  131* 130* 135 132*  K 4.0  --   --  4.7 4.6 4.5 3.9  CL 93*  --   --  97* 98 98 96*  CO2 23  --   --  22 23 27 24   ANIONGAP 13  --   --  12 9 10 12   GLUCOSE 193*  --   --  104* 167* 114* 121*  BUN 34*  --   --  32* 26* 26* 30*  CREATININE 2.28*  --   --  2.11* 1.99* 2.00*  1.97*  AST 35  --   --   --   --   --   --   ALT 21  --   --   --   --   --   --   ALKPHOS 60  --   --   --   --   --   --   BILITOT 0.7  --   --   --   --   --   --   ALBUMIN 3.6  --   --   --   --   --   --   INR  --  1.2  --   --   --   --   --   BNP 715.9*  --  681.9*  --  941.5* 1,023.0* 846.9*  MG 2.4  --   --  2.4 2.4 2.4 2.2  CALCIUM 9.1  --   --  8.9 8.9 9.2 8.8*     Total Time in preparing paper work, data evaluation and todays exam - 35 minutes  Signature  -    Lala Lund M.D on 08/08/2022 at 9:44 AM   -  To page go to [email or web address]

## 2022-08-08 NOTE — Progress Notes (Signed)
Pt being d/c VSS, IV removed, PTAR to transport, report has been called.  Alvis Lemmings, RN 08/08/2022 4:23 PM

## 2022-08-08 NOTE — TOC Transition Note (Signed)
Transition of Care [facility]) - CM/SW Discharge Note   Patient Details  Name: Carl [name] MRN: [id number] Date of Birth: [birth year]  Transition of Care [facility]) CM/SW Contact:  Bjorn Pippin, LCSW Phone Number: 08/08/2022, 3:59 PM   Clinical Narrative:    [name] Anticipated DC date: 08/08/2022 Family notified: ALF notified Transport by: [facility]   CSW faxed ALF [phone]) dc summary and fl2.  Per MD patient ready for DC to [facility]. RN, patient, patient's family, and facility notified of DC. Discharge Summary and FL2 sent to facility. RN to call report prior to discharge [phone]. DC packet on chart. Ambulance transport requested for patient.   CSW will sign off for now as social work intervention is no longer needed. Please consult [place] again if new needs arise.    Final next level of care: Assisted Living Barriers to Discharge: No Barriers Identified   Patient Goals and CMS Choice   Choice offered to / list presented to : NA  Discharge Placement                Patient [facility] bed at: [facility] Patient to be transferred to facility by: [facility] Name of family member notified: N/A Patient and family notified of of transfer: 08/08/22  Discharge Plan and Services Additional resources added to the After Visit Summary for   In-house Referral: NA Discharge Planning Services: CM Consult Post Acute Care Choice: Durable Medical Equipment          DME Arranged: Oxygen DME Agency: [employer] Date DME Agency Contacted: 08/08/22 Time DME Agency Contacted: 0102 Representative spoke with at DME Agency: Brenton Grills HH Arranged: NA          Social Determinants of Health ([place]) Interventions SDOH Screenings   Food Insecurity: No Food Insecurity (08/07/2022)  Housing: Low Risk  (08/07/2022)  Transportation Needs: No Transportation Needs (08/07/2022)  Utilities: Not At Risk (08/07/2022)  Alcohol Screen: Low Risk   (05/15/2019)  Tobacco Use: High Risk (08/04/2022)     Readmission Risk Interventions    08/05/2022    8:36 AM 03/15/2022    2:44 PM  Readmission Risk Prevention Plan  Transportation Screening Complete   HRI or [place]  Complete  Social Work Consult for [place] Planning/Counseling  Complete  Palliative Care Screening  Not Applicable  Medication Review Press photographer) Complete Complete  PCP or Specialist appointment within 3-5 days of discharge Complete   HRI or [place] Not Applicable     Beckey Rutter, MSW, [place], [place] Transitions of Care  Clinical Social Worker I

## 2022-08-08 NOTE — TOC Progression Note (Addendum)
Transition of Care [facility]) - Progression Note    Patient Details  Name: Carl [name] MRN: [id number] Date of Birth: 1960/07/10  Transition of Care [facility]) CM/SW [place], LCSW Phone Number: 08/08/2022, 1:53 PM  Clinical Narrative:    CSW contacted ALF to retrieve fax number to send over dc summary and fl2. Pt will return to [facility] in [place], [place] Fax number is [phone]. CSW has been in communication with Shirlee Limerick [phone]) regarding pt return. Shirlee Limerick informed CSW that if DME is not delivered today, so pt will need to dc tomorrow.   RN CM Manuela Schwartz informed CSW that if DME does deliver today by 4, pt can dc to ALF. Pt will need to be transported by [facility]. CSW called facility to retrieve pt SSN. TOC will continue to follow.    Expected Discharge Plan: Assisted Living Barriers to Discharge: Continued Medical Work up  Expected Discharge Plan and Services In-house Referral: NA Discharge Planning Services: CM Consult Post Acute Care Choice: Durable Medical Equipment Living arrangements for the past 2 months: [place] Expected Discharge Date: 08/08/22               DME Arranged: Oxygen DME Agency: [employer] Date DME Agency Contacted: 08/08/22 Time DME Agency Contacted: [phone] Representative spoke with at DME Agency: Brenton Grills HH Arranged: NA           Social Determinants of Health ([place]) Interventions SDOH Screenings   Food Insecurity: No Food Insecurity (08/07/2022)  Housing: Low Risk  (08/07/2022)  Transportation Needs: No Transportation Needs (08/07/2022)  Utilities: Not At Risk (08/07/2022)  Alcohol Screen: Low Risk  (05/15/2019)  Tobacco Use: High Risk (08/04/2022)    Readmission Risk Interventions    08/05/2022    8:36 AM 03/15/2022    2:44 PM  Readmission Risk Prevention Plan  Transportation Screening Complete   HRI or [place]  Complete  Social Work Consult for [place] Planning/Counseling  Complete  Palliative  Care Screening  Not Applicable  Medication Review Press photographer) Complete Complete  PCP or Specialist appointment within 3-5 days of discharge Complete   HRI or [place] Not Applicable    Beckey Rutter, MSW, [place], [place] Transitions of Care  Clinical Social Worker I

## 2022-08-08 NOTE — Progress Notes (Signed)
   08/08/22 1044  Mobility  Activity Ambulated with assistance to bathroom  Level of Assistance Independent after set-up  Assistive Device Front wheel walker  Distance Ambulated (ft) 20 ft  Activity Response Tolerated well  Mobility Referral Yes  $Mobility charge 1 Mobility   Mobility Specialist Progress Note  Pt was in bed and agreeable. Had no c/o pain. Left in bed w/ all needs met and call bell in reach.  Lucious Groves Mobility Specialist  Please contact via SecureChat or Rehab office at [phone]

## 2022-08-08 NOTE — NC FL2 (Signed)
[place] LEVEL OF CARE FORM     IDENTIFICATION  Patient Name: Carl [name] Birthdate: Mar 18, 1961 Sex: male Admission Date (Current Location): 08/04/2022  [facility] and [place] Number:  Engineering geologist and Address:  [facility] [place], [place] [address] [place], [place] [address]      Provider Number: [id number]  Attending Physician Name and Address:  Thurnell Lose, MD  Relative Name and Phone Number:  Duwaine Maxin [phone]    Current Level of Care: Hospital Recommended Level of Care: [place] Prior Approval Number:    Date Approved/Denied:   PASRR Number:    Discharge Plan:  [name])    Current Diagnoses: Patient Active Problem List   Diagnosis Date Noted   AICD discharge 04/27/2022   Non-ST elevation (NSTEMI) myocardial infarction [facility])    Ventricular fibrillation ([place])    Ventricular tachycardia ([place]) 04/23/2022   Acute respiratory failure with hypoxia ([place]) 04/22/2022   Hypokalemia 04/22/2022   Demand ischemia    Type 2 diabetes mellitus with hyperglycemia ([place]) 03/25/2022   Overweight (BMI 25.0-29.9) 03/23/2022   COVID-19 virus infection 03/23/2022   Respiratory failure ([place]) 03/22/2022   Dilated cardiomyopathy ([place])    Chest pain    COPD exacerbation ([place]) 03/14/2022   Seizure disorder ([place]) 03/14/2022   Dyspnea    Congestive heart failure ([place]) 02/15/2022   Anxiety 02/15/2022   Tobacco use 02/15/2022   Elevated partial thromboplastin time (PTT) 02/15/2022   Abdominal pain 02/15/2022   HFrEF (heart failure with reduced ejection fraction) (HCC)    Weakness    COPD with acute exacerbation ([place]) 05/23/2021   Paroxysmal atrial fibrillation ([place]) 05/23/2021   Chronic anticoagulation 05/23/2021   Abdominal aortic aneurysm (AAA) 35 to 39 mm in diameter ([place]) 05/23/2021   Ischemic cardiomyopathy 05/22/2020   Chronic combined systolic and diastolic heart failure ([place]) 05/22/2020   HCAP  (healthcare-associated pneumonia) 01/24/2020   Hyperkalemia 01/24/2020   Acute on chronic combined systolic and diastolic CHF (congestive heart failure) ([place]) 01/24/2020   History of seizure 01/24/2020   HLD (hyperlipidemia) 01/24/2020   Ascites due to alcoholic cirrhosis ([place])    Acute on chronic HFrEF (heart failure with reduced ejection fraction) ([place]) 11/18/2019   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/18/2019   Elevated troponin 11/18/2019   Hypoxia 11/18/2019   Acute renal failure superimposed on stage 3a chronic kidney disease ([place]) 11/18/2019   CAD in native artery 05/15/2019   History of stroke 05/15/2019   Homicidal ideation 05/15/2019   Hypothyroidism 05/15/2019   Constipation 05/06/2019   Scrotal mass 05/06/2019   Abnormal urine odor 05/06/2019   Bipolar 1 disorder ([place]) 12/23/2018   Chronic pain of left knee 09/17/2018   Atypical chest pain 10/11/2017   Polypharmacy 08/05/2017   Cerebrovascular accident (CVA) due to stenosis of left middle cerebral artery ([place]) 06/26/2017   Urinary retention due to benign prostatic hyperplasia 06/22/2017   Falls 06/20/2017   Risk for falls 02/19/2017   Dyslipidemia 11/19/2016   Peripheral artery disease ([place]) 08/28/2016   Claudication ([place]) 08/09/2016   History of alcohol abuse 08/09/2016   History of drug dependence/abuse ([place]) 08/09/2016   Severe single current episode of major depressive disorder, without psychotic features ([place]) 08/09/2016   Mitral insufficiency 01/04/2016   Prediabetes 12/29/2015   Essential hypertension 12/29/2015   Fracture of left clavicle 07/13/2012    Orientation RESPIRATION BLADDER Height & Weight     Self, Situation  O2 (2 liters) Continent Weight: 183  lb 13.8 oz (83.4 kg) Height:  5\' 9"  (175.3 cm)  BEHAVIORAL SYMPTOMS/MOOD NEUROLOGICAL BOWEL NUTRITION STATUS      Continent Diet (See dc summary)  AMBULATORY STATUS COMMUNICATION OF NEEDS Skin   Limited Assist Verbally Normal                        Personal Care Assistance Level of Assistance  Bathing, Feeding, Dressing Bathing Assistance: Limited assistance Feeding assistance: Independent Dressing Assistance: Limited assistance     Functional Limitations Info  Sight, Hearing, Speech Sight Info: Adequate Hearing Info: Adequate Speech Info: Adequate    SPECIAL CARE FACTORS FREQUENCY                       Contractures Contractures Info: Not present    Additional Factors Info  Code Status, Allergies Code Status Info: Full Allergies Info: Iodinated Contrast Media  Penicillins  Strawberry Extract  Cefepime  Erythromycin  Sulfa Antibiotics           Current Medications (08/08/2022):  This is the current hospital active medication list Current Facility-Administered Medications  Medication Dose Route Frequency Provider Last Rate Last Admin   acetaminophen (TYLENOL) tablet 650 mg  650 mg Oral Q6H PRN Opyd, Ilene Qua, MD       Or   acetaminophen (TYLENOL) suppository 650 mg  650 mg Rectal Q6H PRN Opyd, Ilene Qua, MD       albuterol (PROVENTIL) (2.5 MG/3ML) 0.083% nebulizer solution 2.5 mg  2.5 mg Nebulization Q6H PRN Opyd, Ilene Qua, MD   2.5 mg at 08/05/22 1421   amiodarone (PACERONE) tablet 400 mg  400 mg Oral BID [name], PA-C   400 mg at 08/08/22 8466   apixaban (ELIQUIS) tablet 5 mg  5 mg Oral BID Vianne Bulls, MD   5 mg at 08/08/22 5993   divalproex (DEPAKOTE ER) 24 hr tablet 500 mg  500 mg Oral BID Vianne Bulls, MD   500 mg at 08/08/22 0852   escitalopram (LEXAPRO) tablet 10 mg  10 mg Oral QHS Opyd, Ilene Qua, MD   10 mg at 08/07/22 2149   furosemide (LASIX) tablet 80 mg  80 mg Oral BID Thurnell Lose, MD   80 mg at 08/08/22 5701   hydrOXYzine (ATARAX) tablet 10 mg  10 mg Oral BID Vianne Bulls, MD   10 mg at 08/08/22 0851   insulin aspart (novoLOG) injection 0-5 Units  0-5 Units Subcutaneous QHS Opyd, Ilene Qua, MD       insulin aspart (novoLOG) injection 0-6 Units   0-6 Units Subcutaneous TID WC Opyd, Ilene Qua, MD   2 Units at 08/07/22 1228   isosorbide mononitrate (IMDUR) 24 hr tablet 15 mg  15 mg Oral Daily Thurnell Lose, MD   15 mg at 08/08/22 7793   levETIRAcetam (KEPPRA) tablet 250 mg  250 mg Oral BID Vianne Bulls, MD   250 mg at 08/08/22 9030   levothyroxine (SYNTHROID) tablet 50 mcg  50 mcg Oral Daily Opyd, Ilene Qua, MD   50 mcg at 08/08/22 0515   metoprolol succinate (TOPROL-XL) 24 hr tablet 12.5 mg  12.5 mg Oral QHS Lala Lund K, MD   12.5 mg at 08/07/22 2149   mexiletine (MEXITIL) capsule 250 mg  250 mg Oral Q12H Camnitz, Will Hassell Done, MD   250 mg at 08/08/22 0851   nitroGLYCERIN (NITROSTAT) SL tablet 0.4 mg  0.4 mg Sublingual Q5  min PRN Thurnell Lose, MD       oxyCODONE (Oxy IR/ROXICODONE) immediate release tablet 5 mg  5 mg Oral Q6H PRN Opyd, Ilene Qua, MD       pantoprazole (PROTONIX) EC tablet 40 mg  40 mg Oral Daily Opyd, Ilene Qua, MD   40 mg at 08/08/22 1610   QUEtiapine (SEROQUEL) tablet 200 mg  200 mg Oral QHS Opyd, Ilene Qua, MD   200 mg at 08/07/22 2150   rosuvastatin (CRESTOR) tablet 10 mg  10 mg Oral Daily Opyd, Ilene Qua, MD   10 mg at 08/08/22 0851   sodium chloride flush (NS) 0.9 % injection 3 mL  3 mL Intravenous Q12H Opyd, Ilene Qua, MD   3 mL at 08/08/22 9604   spironolactone (ALDACTONE) tablet 25 mg  25 mg Oral Daily Thurnell Lose, MD   25 mg at 08/08/22 5409   umeclidinium bromide (INCRUSE ELLIPTA) 62.5 MCG/ACT 1 puff  1 puff Inhalation Daily Opyd, Ilene Qua, MD   1 puff at 08/08/22 8119     Discharge Medications: Please see discharge summary for a list of discharge medications.  Relevant Imaging Results:  Relevant Lab Results:   Additional Information SSN: [id number]  Beckey Rutter, MSW, [place] Transitions of Care  Clinical Social Worker I

## 2022-08-08 NOTE — Progress Notes (Signed)
Pt is resting comfortably. SAT WNL, not wearing BiPAP tonight

## 2022-08-08 NOTE — Progress Notes (Signed)
Called and left a message  Alvis Lemmings, RN 08/08/2022 4:31 PM

## 2022-08-08 NOTE — Discharge Instructions (Signed)
Follow with Primary MD [facility], [facility] in 7 days   Get CBC, CMP, Magnesium, 2 view Chest X ray -  checked next visit with your primary MD or SNF MD    Activity: As tolerated with Full fall precautions use walker/cane & assistance as needed  Disposition SNF  Diet: Heart Healthy with strict 1.5 L fluid restriction per day.  Check your Weight same time everyday, if you gain over 2 pounds, or you develop in leg swelling, experience more shortness of breath or chest pain, call your Primary MD immediately. Follow Cardiac Low Salt Diet and 1.5 lit/day fluid restriction.  Special Instructions: If you have smoked or chewed Tobacco  in the last 2 yrs please stop smoking, stop any regular Alcohol  and or any Recreational drug use.  On your next visit with your primary care physician please Get Medicines reviewed and adjusted.  Please request your Prim.MD to go over all Hospital Tests and Procedure/Radiological results at the follow up, please get all Hospital records sent to your Prim MD by signing hospital release before you go home.  If you experience worsening of your admission symptoms, develop shortness of breath, life threatening emergency, suicidal or homicidal thoughts you must seek medical attention immediately by calling 911 or calling your MD immediately  if symptoms less severe.  You Must read complete instructions/literature along with all the possible adverse reactions/side effects for all the Medicines you take and that have been prescribed to you. Take any new Medicines after you have completely understood and accpet all the possible adverse reactions/side effects.

## 2022-08-19 NOTE — Progress Notes (Signed)
Remote ICD transmission.   

## 2022-08-20 ENCOUNTER — Encounter: Payer: Self-pay | Admitting: *Deleted

## 2022-08-27 ENCOUNTER — Ambulatory Visit: Payer: Medicaid Other | Admitting: Internal Medicine

## 2022-09-10 ENCOUNTER — Other Ambulatory Visit
Admission: RE | Admit: 2022-09-10 | Discharge: 2022-09-10 | Disposition: A | Discharge status: Home / Self Care | Payer: Medicaid Other | Source: Ambulatory Visit | Attending: Internal Medicine | Admitting: Internal Medicine

## 2022-09-10 ENCOUNTER — Telehealth: Payer: Self-pay | Admitting: Internal Medicine

## 2022-09-10 ENCOUNTER — Ambulatory Visit: Payer: Medicaid Other | Attending: Internal Medicine | Admitting: Internal Medicine

## 2022-09-10 ENCOUNTER — Encounter: Payer: Self-pay | Admitting: Internal Medicine

## 2022-09-10 VITALS — BP 100/63 | HR 82 | Ht 66.0 in | Wt 182.0 lb

## 2022-09-10 DIAGNOSIS — Z95810 Presence of automatic (implantable) cardiac defibrillator: Secondary | ICD-10-CM

## 2022-09-10 DIAGNOSIS — Z79899 Other long term (current) drug therapy: Secondary | ICD-10-CM

## 2022-09-10 DIAGNOSIS — I472 Ventricular tachycardia, unspecified: Secondary | ICD-10-CM | POA: Diagnosis present

## 2022-09-10 DIAGNOSIS — I5042 Chronic combined systolic (congestive) and diastolic (congestive) heart failure: Secondary | ICD-10-CM

## 2022-09-10 DIAGNOSIS — I5040 Unspecified combined systolic (congestive) and diastolic (congestive) heart failure: Secondary | ICD-10-CM | POA: Diagnosis not present

## 2022-09-10 LAB — PACEMAKER DEVICE OBSERVATION

## 2022-09-10 LAB — CUP PACEART INCLINIC DEVICE CHECK
Battery Remaining Longevity: 53 mo
Battery Voltage: 2.97 V
Brady Statistic AP VP Percent: 57.98 %
Brady Statistic AP VS Percent: 0.08 %
Brady Statistic AS VP Percent: 41.55 %
Brady Statistic AS VS Percent: 0.39 %
Brady Statistic RA Percent Paced: 57.88 %
Brady Statistic RV Percent Paced: 99.06 %
Date Time Interrogation Session: 20240305163733
HighPow Impedance: 90 Ohm
Implantable Lead Connection Status: 753985
Implantable Lead Connection Status: 753985
Implantable Lead Connection Status: 753985
Implantable Lead Implant Date: 20171201
Implantable Lead Implant Date: 20171201
Implantable Lead Implant Date: 20211029
Implantable Lead Location: 753858
Implantable Lead Location: 753859
Implantable Lead Location: 753860
Implantable Lead Model: 5076
Implantable Pulse Generator Implant Date: 20211029
Lead Channel Impedance Value: 209 Ohm
Lead Channel Impedance Value: 218.087
Lead Channel Impedance Value: 230.327
Lead Channel Impedance Value: 230.327
Lead Channel Impedance Value: 241.412
Lead Channel Impedance Value: 418 Ohm
Lead Channel Impedance Value: 418 Ohm
Lead Channel Impedance Value: 418 Ohm
Lead Channel Impedance Value: 456 Ohm
Lead Channel Impedance Value: 513 Ohm
Lead Channel Impedance Value: 608 Ohm
Lead Channel Impedance Value: 703 Ohm
Lead Channel Impedance Value: 703 Ohm
Lead Channel Impedance Value: 722 Ohm
Lead Channel Impedance Value: 722 Ohm
Lead Channel Impedance Value: 779 Ohm
Lead Channel Impedance Value: 779 Ohm
Lead Channel Impedance Value: 817 Ohm
Lead Channel Pacing Threshold Amplitude: 0.5 V
Lead Channel Pacing Threshold Amplitude: 1 V
Lead Channel Pacing Threshold Amplitude: 2 V
Lead Channel Pacing Threshold Amplitude: 2.5 V
Lead Channel Pacing Threshold Pulse Width: 0.4 ms
Lead Channel Pacing Threshold Pulse Width: 0.4 ms
Lead Channel Pacing Threshold Pulse Width: 0.4 ms
Lead Channel Pacing Threshold Pulse Width: 0.8 ms
Lead Channel Sensing Intrinsic Amplitude: 0.625 mV
Lead Channel Sensing Intrinsic Amplitude: 0.875 mV
Lead Channel Sensing Intrinsic Amplitude: 14.75 mV
Lead Channel Sensing Intrinsic Amplitude: 16.875 mV
Lead Channel Setting Pacing Amplitude: 1.5 V
Lead Channel Setting Pacing Amplitude: 1.5 V
Lead Channel Setting Pacing Amplitude: 2.5 V
Lead Channel Setting Pacing Pulse Width: 0.4 ms
Lead Channel Setting Pacing Pulse Width: 0.8 ms
Lead Channel Setting Sensing Sensitivity: 0.3 mV
Zone Setting Status: 755011
Zone Setting Status: 755011

## 2022-09-10 LAB — BASIC METABOLIC PANEL
Anion gap: 11 (ref 5–15)
BUN: 31 mg/dL — ABNORMAL HIGH (ref 8–23)
CO2: 25 mmol/L (ref 22–32)
Calcium: 9.2 mg/dL (ref 8.9–10.3)
Chloride: 97 mmol/L — ABNORMAL LOW (ref 98–111)
Creatinine, Ser: 2.17 mg/dL — ABNORMAL HIGH (ref 0.61–1.24)
GFR, Estimated: 33 mL/min — ABNORMAL LOW (ref >60)
Glucose, Bld: 92 mg/dL (ref 70–99)
Potassium: 4.2 mmol/L (ref 3.5–5.1)
Sodium: 133 mmol/L — ABNORMAL LOW (ref 135–145)

## 2022-09-10 LAB — TSH: TSH: 14.422 u[IU]/mL — ABNORMAL HIGH (ref 0.350–4.500)

## 2022-09-10 MED ORDER — POTASSIUM CHLORIDE CRYS ER 10 MEQ PO TBCR: 40.0000 meq | EXTENDED_RELEASE_TABLET | Freq: Every day | ORAL | 3 refills | Status: DC

## 2022-09-10 MED ORDER — POTASSIUM CHLORIDE CRYS ER 10 MEQ PO TBCR: 10.0000 meq | EXTENDED_RELEASE_TABLET | Freq: Four times a day (QID) | ORAL | 3 refills | Status: DC

## 2022-09-10 MED ORDER — POTASSIUM CHLORIDE CRYS ER 10 MEQ PO TBCR: 40.0000 meq | EXTENDED_RELEASE_TABLET | Freq: Four times a day (QID) | ORAL | 3 refills | Status: DC

## 2022-09-10 NOTE — Patient Instructions (Signed)
Medication Instructions:  Your physician has recommended you make the following change in your medication:  In 4 weeks reduce amiodarone (PACERONE) 400 MG tablet to 200 MG Take 200 mg by mouth daily  potassium chloride (KLOR-CON M) 10 MEQ tablet - Take 1 tablet (10 mEq total) by mouth 4 (four) times daily  *If you need a refill on your cardiac medications before your next appointment, please call your pharmacy*   Lab Work: Your physician recommends that you get lab work today: [employer] agent at [facility] 1st desk on the right to check in (REGISTRATION)  Lab hours: Monday- Friday (7:30 am- 5:30 pm)  If you have labs (blood work) drawn today and your tests are completely normal, you will receive your results only by: [place] (if you have MyChart) OR A paper copy in the mail If you have any lab test that is abnormal or we need to change your treatment, we will call you to review the results.   Testing/Procedures: -None   Follow-Up: At [facility], you and your health needs are our priority.  As part of our continuing mission to provide you with exceptional heart care, we have created designated Provider Care Teams.  These Care Teams include your primary Cardiologist (physician) and Advanced Practice Providers (APPs -  Physician Assistants and Nurse Practitioners) who all work together to provide you with the care you need, when you need it.  We recommend signing up for the patient portal called "MyChart".  Sign up information is provided on this After Visit Summary.  MyChart is used to connect with patients for Virtual Visits (Telemedicine).  Patients are able to view lab/test results, encounter notes, upcoming appointments, etc.  Non-urgent messages can be sent to your provider as well.   To learn more about what you can do with MyChart, go to [email or web address].    Your next appointment:   3 month(s)  Provider:   Virl Axe, MD    Other  Instructions -None

## 2022-09-10 NOTE — Progress Notes (Signed)
Patient Care Team: [facility], [facility] Making as PCP - General (Geriatric Medicine) Wellington Hampshire, MD as PCP - Cardiology (Cardiology) Deboraha Sprang, MD as PCP - Electrophysiology (Cardiology)  CC CHF  HPI  Carl [name] is a 62 y.o. male seen in follow-up for  CHF and atrial fibrillation with  dual-chamber ICD, intercurrent left bundle branch block and the issue raised at [facility] of CRT  Underwent CRT upgrade 10/21  (He is accompanied by Carl [name])  Intercurrent hospitalization 9/23 NSTEMI acute CHF in the context of COVID complicated by renal failure and atrial fibrillation Interval hospitalization 10/23 following ICD discharge for ventricular fibrillation which may have been proarrhythmic from antitachycardia pacing in the context of rapid atrial fibrillation.  Digoxin was discontinued and amiodarone initiated. Hospitalized 11/23 recurrent ICD's discharge and referred to advanced heart failure 1/20 for recurrent VT now below his detection rate, Mexiletine was started and discontinued.  Tolerating the amiodarone.  Transient nausea now resolved  The patient denies chest pain, shortness of breath, nocturnal dyspnea, orthopnea or peripheral edema.  There have been no palpitations, lightheadedness or syncope.     No bleeding on the Eliquis  Previously on Entresto stopped because of weakness and hypotension   The state is his legal guardian.   DATE TEST EF    6/17 LHC    % LAD p-80, D1-80, CX m-80 RCA m >>DESx2  4/20 Echo   20-25% %    5/21 Echo  25-30%    6/21 Myoview 23% Antero/infero apical Defect fixed   7/21 Echo  25-30% MR mild-mod  2/22 Echo  30-35% LAE severe  /23 LHC  LIMA-LAD; SVG-OM2: patent RCA diffuse disease  1/24 Echo   25-30%     Date Cr K Hgb TSH LFTs  5/21 1.4<<1.02 4.5 14.2     10/21 1.47 4.7 14.4    1/24 1.97<<2.2 3.9 12.7  35      Thromboembolic risk factors (, HTN-1, TIA/CVA-2, Vasc disease -1, CHF-1) for a CHADSVASc Score of .>=5  Records  and Results Reviewed   Past Medical History:  Diagnosis Date   Acute pulmonary edema ([place]) 2017   Acute respiratory failure with hypoxia ([place]) 2017   Bipolar 1 disorder (HCC)    CAD (coronary artery disease)    a. Prior RCA stenting; b. 2017 s/p CABG x 2 (LIMA->D1, VG->OM2); b. 04/2022 Demand Isch/Cath: LM mild dzs, LAD 4m D1 100, LCX mod diff dzs, OM2 100, OM3 50, RCA 40/20p ISR, 85d (iFR 0.99), 50d, RPAV 60, LIMA->D1 small, patent, VG->OM2 large, mild dzs-->Med Rx.   Chronic HFrEF (heart failure with reduced ejection fraction) ([place]    a. 02/2022 Echo: EF 25-30%, mild conc LVH, nl RV fxn, sev dil LA, mildly dil RA, mild MR/AI.   CKD (chronic kidney disease), stage III (HCC)    COPD (chronic obstructive pulmonary disease) ([place]    Depression    Hypertension    Ischemic cardiomyopathy    a. 04/2020 s/p MDT MRI compatible CRT-D (Ser # [id number]); b. 02/2022 Echo: EF 25-30%.   Myocardial infarction (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/2016   Seizures ([place]    childhood   Stroke ([facility]    Tobacco abuse     Past Surgical History:  Procedure Laterality Date   BIV UPGRADE N/A 05/05/2020   Procedure: BIV ICD UPGRADE;  Surgeon: KDeboraha Sprang MD;  [facility];  Service: Cardiovascular;  Laterality: N/A;   CORONARY ARTERY BYPASS GRAFT     INTRAVASCULAR PRESSURE WIRE/FFR STUDY N/A 04/25/2022   Procedure: INTRAVASCULAR PRESSURE WIRE/FFR STUDY;  Surgeon: Nelva Bush, MD;  Location: [place] CV LAB;  Service: Cardiovascular;  Laterality: N/A;   RIGHT/LEFT HEART CATH AND CORONARY/GRAFT ANGIOGRAPHY N/A 04/25/2022   Procedure: RIGHT/LEFT HEART CATH AND CORONARY/GRAFT ANGIOGRAPHY;  Surgeon: Nelva Bush, MD;  Location: [place] CV LAB;  Service: Cardiovascular;  Laterality: N/A;    Current Meds  Medication Sig   acetaminophen (TYLENOL) 500 MG tablet Take 500 mg by mouth  every 6 (six) hours as needed for mild pain.   albuterol (PROVENTIL) (2.5 MG/3ML) 0.083% nebulizer solution Take 3 mLs (2.5 mg total) by nebulization every 6 (six) hours as needed for wheezing or shortness of breath.   albuterol (VENTOLIN HFA) 108 (90 Base) MCG/ACT inhaler Inhale 2 puffs into the lungs every 6 (six) hours as needed for wheezing.   amiodarone (PACERONE) 400 MG tablet Take 400 mg by mouth daily.   apixaban (ELIQUIS) 5 MG TABS tablet TAKE 1 TABLET BY MOUTH TWICE A DAY   CHLORASEPTIC 1.4 % LIQD Use as directed 1 spray in the mouth or throat as needed.   dapagliflozin propanediol (FARXIGA) 10 MG TABS tablet Take 1 tablet (10 mg total) by mouth daily.   divalproex (DEPAKOTE ER) 500 MG 24 hr tablet Take 1 tablet (500 mg total) by mouth 2 (two) times daily.   escitalopram (LEXAPRO) 10 MG tablet Take 1 tablet (10 mg total) by mouth at bedtime.   ezetimibe (ZETIA) 10 MG tablet Take 1 tablet (10 mg total) by mouth daily.   furosemide (LASIX) 20 MG tablet Take 3 tablets (60 mg total) by mouth 2 (two) times daily.   HYDROcodone-acetaminophen (NORCO/VICODIN) 5-325 MG tablet Take 1 tablet by mouth every 6 (six) hours as needed for severe pain.   Hydrocortisone Acetate 1 % CREA Apply 1 Application topically 3 (three) times daily as needed (itching).   hydrOXYzine (ATARAX) 10 MG tablet Take 10 mg by mouth 2 (two) times daily.   isosorbide mononitrate (IMDUR) 30 MG 24 hr tablet Take 0.5 tablets (15 mg total) by mouth daily.   levETIRAcetam (KEPPRA) 250 MG tablet Take 1 tablet (250 mg total) by mouth 2 (two) times daily.   levothyroxine (SYNTHROID) 50 MCG tablet Take 50 mcg by mouth every morning.   losartan (COZAAR) 25 MG tablet Take 12.5 mg by mouth daily.   metoprolol succinate (TOPROL-XL) 25 MG 24 hr tablet Take 0.5 tablets (12.5 mg total) by mouth at bedtime.   Multiple Vitamin (MULTIVITAMIN WITH MINERALS) TABS tablet Take 1 tablet by mouth daily.   nitroGLYCERIN (NITROSTAT) 0.4 MG SL tablet  Place 1 tablet (0.4 mg total) under the tongue every 5 (five) minutes as needed for chest pain.   omeprazole (PRILOSEC) 20 MG capsule Take 20 mg by mouth daily.   potassium chloride SA (KLOR-CON M) 20 MEQ tablet Take 2 tablets (40 mEq total) by mouth daily.   QUEtiapine (SEROQUEL) 200 MG tablet Take 200 mg by mouth at bedtime.   rosuvastatin (CRESTOR) 10 MG tablet Take 1 tablet (10 mg total) by mouth daily. (Patient taking differently: Take 10 mg by mouth at bedtime.)   spironolactone (ALDACTONE) 25 MG tablet Take 1 tablet (25 mg total) by mouth daily.   tiotropium (SPIRIVA) 18 MCG inhalation capsule Place 18 mcg into inhaler and inhale daily.   traMADol (ULTRAM) 50 MG tablet  Take 50 mg by mouth every 12 (twelve) hours as needed for moderate pain.    Allergies  Allergen Reactions   Iodinated Contrast Media Shortness Of Breath   Penicillins Anaphylaxis and Shortness Of Breath    Respiratory  Tolerated cefuroxime on 01/06/16   Strawberry Extract Anaphylaxis   Cefepime Itching    Empiric antibiotic, developed pruritis.    Erythromycin Itching   Sulfa Antibiotics Itching, Nausea And Vomiting and Nausea Only      Review of Systems negative except from HPI and PMH  Physical Exam BP 100/63 (BP Location: Left Arm, Patient Position: Sitting, Cuff Size: Normal)   Pulse 82   Ht '5\' 6"'$  (1.676 m)   Wt 182 lb (82.6 kg)   SpO2 98%   BMI 29.38 kg/m  Well developed and well nourished in no acute distress HENT normal edentulous Neck supple with JVP-flat Clear Device pocket well healed; without hematoma or erythema.  There is no tethering  Regular rate and rhythm, no  gallop No  murmur Abd-soft with active BS No Clubbing cyanosis TR edema Skin-warm and dry A & Oriented  Grossly normal sensory and motor function  ECG AV pacing with an upright QRS lead V1 and negative QRS lead I  Device function is normal. Programming changes   See Paceart for details    Assessment and  Plan  Ischemic  cardiomyopathy-'s s/p CABG plus mitral valve ring  Ventricular tachycardia-recurrent-storm   Congestive heart failure-chronic-systolic-class IIb-IIIa   Implantable defibrillator-CRT-[employer]   Left bundle branch block   Atrial fibrillation-persistent  strokes-multiple   Bipolar disorder   Renal insufficiency chronic class 3  worse   (GFR -65  2/22)    Interval cardioversion with improvement in ventricular instability.  Also on amiodarone and readmitted because of ventricular tachycardia below the detection rate, device was reprogrammed to a lower rate of 140.  Hopefully this will suffice.  Will continue his amiodarone and decrease it to 200 mg from 40 mg in 4 weeks time.  Will check his TSH today transaminases were okay recently.  Renal function is clearly and we will recheck to see if it is gone back towards his baseline of 1.4 was most recently right around 2.0  No clinical bleeding.  Continue the Eliquis.  Euvolemic.  Followed by heart failure

## 2022-09-10 NOTE — Telephone Encounter (Signed)
Clarified with Dr. Caryl Comes, patient will take potassium 40 meq (4 of the 10 meq tablets) by mouth daily.

## 2022-09-10 NOTE — Telephone Encounter (Signed)
Pt c/o medication issue:  1. Name of Medication: potassium chloride (KLOR-CON M) 10 MEQ tablet   2. How are you currently taking this medication (dosage and times per day)?   3. Are you having a reaction (difficulty breathing--STAT)?   4. What is your medication issue? Pharmacy called in asking clarification on dosage and instructions for this medication.

## 2022-09-13 ENCOUNTER — Encounter: Payer: Self-pay | Admitting: Internal Medicine

## 2022-09-17 ENCOUNTER — Emergency Department
Admission: EM | Admit: 2022-09-17 | Discharge: 2022-09-17 | Disposition: A | Discharge status: Skilled Nursing Facility | Payer: Medicaid Other | Attending: Emergency Medicine | Admitting: Emergency Medicine

## 2022-09-17 ENCOUNTER — Encounter: Payer: Medicaid Other | Admitting: Internal Medicine

## 2022-09-17 DIAGNOSIS — R569 Unspecified convulsions: Secondary | ICD-10-CM

## 2022-09-17 LAB — CBC WITH DIFFERENTIAL/PLATELET
Abs Immature Granulocytes: 0.06 10*3/uL (ref 0.00–0.07)
Basophils Absolute: 0.1 10*3/uL (ref 0.0–0.1)
Basophils Relative: 1 %
Eosinophils Absolute: 0.2 10*3/uL (ref 0.0–0.5)
Eosinophils Relative: 2 %
HCT: 39.3 % (ref 39.0–52.0)
Hemoglobin: 13.1 g/dL (ref 13.0–17.0)
Immature Granulocytes: 1 %
Lymphocytes Relative: 14 %
Lymphs Abs: 1.1 10*3/uL (ref 0.7–4.0)
MCH: 30.3 pg (ref 26.0–34.0)
MCHC: 33.3 g/dL (ref 30.0–36.0)
MCV: 90.8 fL (ref 80.0–100.0)
Monocytes Absolute: 1 10*3/uL (ref 0.1–1.0)
Monocytes Relative: 13 %
Neutro Abs: 5.3 10*3/uL (ref 1.7–7.7)
Neutrophils Relative %: 69 %
Platelets: 208 10*3/uL (ref 150–400)
RBC: 4.33 MIL/uL (ref 4.22–5.81)
RDW: 17 % — ABNORMAL HIGH (ref 11.5–15.5)
WBC: 7.7 10*3/uL (ref 4.0–10.5)
nRBC: 0 % (ref 0.0–0.2)

## 2022-09-17 LAB — BASIC METABOLIC PANEL
Anion gap: 9 (ref 5–15)
BUN: 25 mg/dL — ABNORMAL HIGH (ref 8–23)
CO2: 24 mmol/L (ref 22–32)
Calcium: 9.2 mg/dL (ref 8.9–10.3)
Chloride: 99 mmol/L (ref 98–111)
Creatinine, Ser: 2.23 mg/dL — ABNORMAL HIGH (ref 0.61–1.24)
GFR, Estimated: 32 mL/min — ABNORMAL LOW (ref >60)
Glucose, Bld: 75 mg/dL (ref 70–99)
Potassium: 4.9 mmol/L (ref 3.5–5.1)
Sodium: 132 mmol/L — ABNORMAL LOW (ref 135–145)

## 2022-09-17 LAB — URINALYSIS, ROUTINE W REFLEX MICROSCOPIC
Bilirubin Urine: NEGATIVE
Glucose, UA: 50 mg/dL — AB
Hgb urine dipstick: NEGATIVE
Ketones, ur: NEGATIVE mg/dL
Leukocytes,Ua: NEGATIVE
Nitrite: NEGATIVE
Protein, ur: NEGATIVE mg/dL
Specific Gravity, Urine: 1.006 (ref 1.005–1.030)
pH: 6 (ref 5.0–8.0)

## 2022-09-17 LAB — VALPROIC ACID LEVEL: Valproic Acid Lvl: 95 ug/mL (ref 50.0–100.0)

## 2022-09-17 MED ORDER — SODIUM CHLORIDE 0.9 % IV BOLUS
500.0000 mL | Freq: Once | INTRAVENOUS | Status: AC
  Administered 2022-09-17: 500 mL via INTRAVENOUS

## 2022-09-17 NOTE — ED Triage Notes (Signed)
Pt presents to the ED due to seizure like activities. Caregiver states they were at an appoint to see his cardiologist when patient started to have seizure like activities. Pt NAD. Pt's BP 87/62 in triage. Pt roomed to 25

## 2022-09-17 NOTE — ED Notes (Signed)
Called [name] and gave discharge instructions to [place].

## 2022-09-17 NOTE — ED Provider Notes (Signed)
[facility] Provider Note    Event Date/Time   First MD Initiated Contact with Patient 09/17/22 1028     (approximate)   History   Seizures   HPI  Carl [name] is a 62 y.o. male  who presents to the emergency department today for a seizure.  The patient was going to see the cardiologist when he had roughly a minute long seizure like episode.  At the time my exam patient states he does not have any headache or pain.  States he does have a history of seizures.  Unclear if he has been taking his medication as prescribed.  Denies any recent illness.     Physical Exam   Triage Vital Signs: ED Triage Vitals  Enc Vitals Group     BP 09/17/22 1022 (!) 87/62     Pulse Rate 09/17/22 1022 63     Resp 09/17/22 1022 18     Temp 09/17/22 1022 97.8 F (36.6 C)     Temp Source 09/17/22 1022 Oral     SpO2 09/17/22 1022 95 %     Weight 09/17/22 1023 181 lb 14.1 oz (82.5 kg)     Height 09/17/22 1023 '5\' 6"'$  (1.676 m)     Head Circumference --      Peak Flow --      Pain Score 09/17/22 1023 0     Pain Loc --      Pain Edu? --      Excl. in [place]? --     Most recent vital signs: Vitals:   09/17/22 1022  BP: (!) 87/62  Pulse: 63  Resp: 18  Temp: 97.8 F (36.6 C)  SpO2: 95%   General: Awake, alert, oriented. CV:  Good peripheral perfusion. Regular rate and rhythm. Resp:  Normal effort. Lungs clear. Abd:  No distention.    ED Results / Procedures / Treatments   Labs (all labs ordered are listed, but only abnormal results are displayed) Labs Reviewed  CBC WITH DIFFERENTIAL/PLATELET - Abnormal; Notable for the following components:      Result Value   RDW 17.0 (*)    All other components within normal limits  BASIC METABOLIC PANEL - Abnormal; Notable for the following components:   Sodium 132 (*)    BUN 25 (*)    Creatinine, Ser 2.23 (*)    GFR, Estimated 32 (*)    All other components within normal limits  URINALYSIS, ROUTINE W REFLEX MICROSCOPIC -  Abnormal; Notable for the following components:   Color, Urine YELLOW (*)    APPearance CLEAR (*)    Glucose, UA 50 (*)    All other components within normal limits  VALPROIC ACID LEVEL     EKG  I, Nance Pear, attending physician, personally viewed and interpreted this EKG  EKG Time: 1034 Rate: 79 Rhythm: paced rhyth Axis: rightward axis Intervals: qtc 653 QRS: wide ST changes: no st elevation Impression: abnormal ekg    RADIOLOGY None   PROCEDURES:  Critical Care performed: No    MEDICATIONS ORDERED IN ED: Medications - No data to display   IMPRESSION / MDM / [place] / ED COURSE  I reviewed the triage vital signs and the nursing notes.                              Differential diagnosis includes, but is not limited to, seizure, pseudoseizure, infection, medication non compliance.  Patient's presentation is most consistent with acute presentation with potential threat to life or bodily function.   The patient is on the cardiac monitor to evaluate for evidence of arrhythmia and/or significant heart rate changes.  Patient presented to the emergency department today after having apparent surgery while going to the cardiologist office.  At the time of my exam patient was awake and alert.  No abnormal movements.  Patient does have history of seizures and is on antiepileptics.  Blood work here without concerning signs for infection or electrolyte abnormality.  Patient was observed in the emergency department for a number of hours without any further seizure-like activity.  This point I think is reasonable for patient be discharged home to follow-up with his outpatient providers.      FINAL CLINICAL IMPRESSION(S) / ED DIAGNOSES   Final diagnoses:  Seizure-like activity ([place])     Note:  This document was prepared using Dragon voice recognition software and may include unintentional dictation errors.    Nance Pear, MD 09/17/22 [phone]

## 2022-09-17 NOTE — ED Notes (Addendum)
First Nurse Note: Pt to ED via [facility] for seizure like activity. Per RN pt showed up for appt on wrong day, when patient was talking to staff pt had what appeared to be a seizure. Pt hit his head on the wall. RN reports that activity lasted about 1 minute. Pt was alert upon arrival and in NAD.

## 2022-09-17 NOTE — Discharge Instructions (Signed)
Please seek medical attention for any high fevers, chest pain, shortness of breath, change in behavior, persistent vomiting, bloody stool or any other new or concerning symptoms.  

## 2022-09-17 NOTE — ED Notes (Signed)
At this time called patients legal guardian [name] and informed her this patient was going to be discharged and gave her the discharge instructions and to follow up with neurology.

## 2022-09-18 ENCOUNTER — Telehealth: Payer: Self-pay

## 2022-09-18 NOTE — Telephone Encounter (Signed)
Thanks so much for ALL you do and ALL you bring-- I am very grateful for you and your smarts (in that order) Lets arrange to have him come in to [place] His ATP algorithm could be made more aggressive and probably more successful with his o/w slow VT  Thanks SK

## 2022-09-18 NOTE — Telephone Encounter (Signed)
Alert [employer]   Device alert for VT with successful HV therapy Event occurred 3/12 @ 09:57, EGM shows sustained VT HR 146, ATP delivered x2 briefly terminating arrhythmia, VT resumes mean HR 143, ATP delivered x6 followed by HV therapy 35J converting arrhythmia. There were 4 NSVT prior to the above events Pt was seen in the ED for "seizure activity"    Spoke with administrator at [facility] which is where patient lives, he stated that patient was in his usual state of health yesterday until incident of "seizer like activity" for which patient was transported to ER, patients ICD was not checked in the ER.   Administrator stated that patient has not been sick recently and taking all meds as prescribed. Patient does not drive.  Informed administrator that this information would be forwarded to MD and would call back if there were medication changes/apt needed., Also informed administrator that if patient every goes to ED that they need to let the ED know I patient has ICD.

## 2022-09-18 NOTE — Telephone Encounter (Signed)
Spoke with facility, patient scheduled for 10/15/22 at 1040 AM

## 2022-09-19 ENCOUNTER — Emergency Department: Payer: Medicaid Other

## 2022-09-19 ENCOUNTER — Inpatient Hospital Stay
Admission: EM | Admit: 2022-09-19 | Discharge: 2022-09-24 | DRG: 291 | Disposition: A | Discharge status: Nursing Home (Includes ICF) | Payer: Medicaid Other | Source: Skilled Nursing Facility | Attending: Internal Medicine | Admitting: Internal Medicine

## 2022-09-19 DIAGNOSIS — R079 Chest pain, unspecified: Secondary | ICD-10-CM | POA: Diagnosis present

## 2022-09-19 DIAGNOSIS — F1011 Alcohol abuse, in remission: Secondary | ICD-10-CM | POA: Diagnosis present

## 2022-09-19 DIAGNOSIS — Z882 Allergy status to sulfonamides status: Secondary | ICD-10-CM

## 2022-09-19 DIAGNOSIS — J810 Acute pulmonary edema: Secondary | ICD-10-CM

## 2022-09-19 DIAGNOSIS — I10 Essential (primary) hypertension: Secondary | ICD-10-CM | POA: Diagnosis present

## 2022-09-19 DIAGNOSIS — Z8673 Personal history of transient ischemic attack (TIA), and cerebral infarction without residual deficits: Secondary | ICD-10-CM

## 2022-09-19 DIAGNOSIS — E878 Other disorders of electrolyte and fluid balance, not elsewhere classified: Secondary | ICD-10-CM | POA: Diagnosis not present

## 2022-09-19 DIAGNOSIS — Z881 Allergy status to other antibiotic agents status: Secondary | ICD-10-CM

## 2022-09-19 DIAGNOSIS — Z8042 Family history of malignant neoplasm of prostate: Secondary | ICD-10-CM

## 2022-09-19 DIAGNOSIS — Z720 Tobacco use: Secondary | ICD-10-CM | POA: Diagnosis present

## 2022-09-19 DIAGNOSIS — Z91041 Radiographic dye allergy status: Secondary | ICD-10-CM

## 2022-09-19 DIAGNOSIS — Z66 Do not resuscitate: Secondary | ICD-10-CM | POA: Diagnosis present

## 2022-09-19 DIAGNOSIS — Z4502 Encounter for adjustment and management of automatic implantable cardiac defibrillator: Secondary | ICD-10-CM

## 2022-09-19 DIAGNOSIS — E039 Hypothyroidism, unspecified: Secondary | ICD-10-CM | POA: Diagnosis present

## 2022-09-19 DIAGNOSIS — I25118 Atherosclerotic heart disease of native coronary artery with other forms of angina pectoris: Secondary | ICD-10-CM | POA: Diagnosis present

## 2022-09-19 DIAGNOSIS — I5043 Acute on chronic combined systolic (congestive) and diastolic (congestive) heart failure: Secondary | ICD-10-CM | POA: Diagnosis present

## 2022-09-19 DIAGNOSIS — Z95810 Presence of automatic (implantable) cardiac defibrillator: Secondary | ICD-10-CM

## 2022-09-19 DIAGNOSIS — I2489 Other forms of acute ischemic heart disease: Secondary | ICD-10-CM | POA: Diagnosis present

## 2022-09-19 DIAGNOSIS — Z880 Allergy status to penicillin: Secondary | ICD-10-CM

## 2022-09-19 DIAGNOSIS — W19XXXA Unspecified fall, initial encounter: Secondary | ICD-10-CM | POA: Diagnosis present

## 2022-09-19 DIAGNOSIS — Z888 Allergy status to other drugs, medicaments and biological substances status: Secondary | ICD-10-CM

## 2022-09-19 DIAGNOSIS — Z9109 Other allergy status, other than to drugs and biological substances: Secondary | ICD-10-CM

## 2022-09-19 DIAGNOSIS — Z1152 Encounter for screening for COVID-19: Secondary | ICD-10-CM

## 2022-09-19 DIAGNOSIS — F1010 Alcohol abuse, uncomplicated: Secondary | ICD-10-CM | POA: Diagnosis present

## 2022-09-19 DIAGNOSIS — M25572 Pain in left ankle and joints of left foot: Secondary | ICD-10-CM | POA: Diagnosis present

## 2022-09-19 DIAGNOSIS — R140 Abdominal distension (gaseous): Secondary | ICD-10-CM | POA: Diagnosis present

## 2022-09-19 DIAGNOSIS — I272 Pulmonary hypertension, unspecified: Secondary | ICD-10-CM | POA: Diagnosis present

## 2022-09-19 DIAGNOSIS — J9601 Acute respiratory failure with hypoxia: Principal | ICD-10-CM

## 2022-09-19 DIAGNOSIS — I4901 Ventricular fibrillation: Secondary | ICD-10-CM | POA: Diagnosis present

## 2022-09-19 DIAGNOSIS — F17210 Nicotine dependence, cigarettes, uncomplicated: Secondary | ICD-10-CM | POA: Diagnosis present

## 2022-09-19 DIAGNOSIS — I472 Ventricular tachycardia, unspecified: Secondary | ICD-10-CM | POA: Diagnosis present

## 2022-09-19 DIAGNOSIS — I7143 Infrarenal abdominal aortic aneurysm, without rupture: Secondary | ICD-10-CM | POA: Diagnosis present

## 2022-09-19 DIAGNOSIS — I480 Paroxysmal atrial fibrillation: Secondary | ICD-10-CM | POA: Diagnosis present

## 2022-09-19 DIAGNOSIS — E1122 Type 2 diabetes mellitus with diabetic chronic kidney disease: Secondary | ICD-10-CM | POA: Diagnosis present

## 2022-09-19 DIAGNOSIS — N1832 Chronic kidney disease, stage 3b: Secondary | ICD-10-CM | POA: Diagnosis present

## 2022-09-19 DIAGNOSIS — I2510 Atherosclerotic heart disease of native coronary artery without angina pectoris: Secondary | ICD-10-CM | POA: Diagnosis present

## 2022-09-19 DIAGNOSIS — Z515 Encounter for palliative care: Secondary | ICD-10-CM

## 2022-09-19 DIAGNOSIS — Z7901 Long term (current) use of anticoagulants: Secondary | ICD-10-CM

## 2022-09-19 DIAGNOSIS — J449 Chronic obstructive pulmonary disease, unspecified: Secondary | ICD-10-CM | POA: Diagnosis present

## 2022-09-19 DIAGNOSIS — I252 Old myocardial infarction: Secondary | ICD-10-CM

## 2022-09-19 DIAGNOSIS — I130 Hypertensive heart and chronic kidney disease with heart failure and stage 1 through stage 4 chronic kidney disease, or unspecified chronic kidney disease: Principal | ICD-10-CM | POA: Diagnosis present

## 2022-09-19 DIAGNOSIS — I714 Abdominal aortic aneurysm, without rupture, unspecified: Secondary | ICD-10-CM | POA: Diagnosis present

## 2022-09-19 DIAGNOSIS — I255 Ischemic cardiomyopathy: Secondary | ICD-10-CM | POA: Diagnosis present

## 2022-09-19 DIAGNOSIS — Z8616 Personal history of COVID-19: Secondary | ICD-10-CM

## 2022-09-19 DIAGNOSIS — G40909 Epilepsy, unspecified, not intractable, without status epilepticus: Secondary | ICD-10-CM | POA: Diagnosis present

## 2022-09-19 DIAGNOSIS — R45851 Suicidal ideations: Secondary | ICD-10-CM | POA: Diagnosis present

## 2022-09-19 DIAGNOSIS — E1165 Type 2 diabetes mellitus with hyperglycemia: Secondary | ICD-10-CM | POA: Diagnosis present

## 2022-09-19 DIAGNOSIS — Z79899 Other long term (current) drug therapy: Secondary | ICD-10-CM

## 2022-09-19 DIAGNOSIS — Z79890 Hormone replacement therapy (postmenopausal): Secondary | ICD-10-CM

## 2022-09-19 DIAGNOSIS — Z7189 Other specified counseling: Secondary | ICD-10-CM

## 2022-09-19 DIAGNOSIS — F319 Bipolar disorder, unspecified: Secondary | ICD-10-CM | POA: Diagnosis present

## 2022-09-19 DIAGNOSIS — Z951 Presence of aortocoronary bypass graft: Secondary | ICD-10-CM

## 2022-09-19 LAB — COMPREHENSIVE METABOLIC PANEL
ALT: 19 U/L (ref 0–44)
AST: 30 U/L (ref 15–41)
Albumin: 3.4 g/dL — ABNORMAL LOW (ref 3.5–5.0)
Alkaline Phosphatase: 68 U/L (ref 38–126)
Anion gap: 10 (ref 5–15)
BUN: 21 mg/dL (ref 8–23)
CO2: 24 mmol/L (ref 22–32)
Calcium: 8.6 mg/dL — ABNORMAL LOW (ref 8.9–10.3)
Chloride: 100 mmol/L (ref 98–111)
Creatinine, Ser: 2 mg/dL — ABNORMAL HIGH (ref 0.61–1.24)
GFR, Estimated: 37 mL/min — ABNORMAL LOW (ref >60)
Glucose, Bld: 107 mg/dL — ABNORMAL HIGH (ref 70–99)
Potassium: 3.5 mmol/L (ref 3.5–5.1)
Sodium: 134 mmol/L — ABNORMAL LOW (ref 135–145)
Total Bilirubin: 0.8 mg/dL (ref 0.3–1.2)
Total Protein: 7.1 g/dL (ref 6.5–8.1)

## 2022-09-19 LAB — CBC WITH DIFFERENTIAL/PLATELET
Abs Immature Granulocytes: 0.06 10*3/uL (ref 0.00–0.07)
Basophils Absolute: 0.1 10*3/uL (ref 0.0–0.1)
Basophils Relative: 1 %
Eosinophils Absolute: 0.1 10*3/uL (ref 0.0–0.5)
Eosinophils Relative: 2 %
HCT: 37 % — ABNORMAL LOW (ref 39.0–52.0)
Hemoglobin: 11.9 g/dL — ABNORMAL LOW (ref 13.0–17.0)
Immature Granulocytes: 1 %
Lymphocytes Relative: 14 %
Lymphs Abs: 1.1 10*3/uL (ref 0.7–4.0)
MCH: 29.8 pg (ref 26.0–34.0)
MCHC: 32.2 g/dL (ref 30.0–36.0)
MCV: 92.5 fL (ref 80.0–100.0)
Monocytes Absolute: 1 10*3/uL (ref 0.1–1.0)
Monocytes Relative: 13 %
Neutro Abs: 5.4 10*3/uL (ref 1.7–7.7)
Neutrophils Relative %: 69 %
Platelets: 162 10*3/uL (ref 150–400)
RBC: 4 MIL/uL — ABNORMAL LOW (ref 4.22–5.81)
RDW: 16.7 % — ABNORMAL HIGH (ref 11.5–15.5)
WBC: 7.8 10*3/uL (ref 4.0–10.5)
nRBC: 0 % (ref 0.0–0.2)

## 2022-09-19 LAB — LACTIC ACID, PLASMA: Lactic Acid, Venous: 1.7 mmol/L (ref 0.5–1.9)

## 2022-09-19 LAB — BRAIN NATRIURETIC PEPTIDE: B Natriuretic Peptide: 1582 pg/mL — ABNORMAL HIGH (ref 0.0–100.0)

## 2022-09-19 LAB — TROPONIN I (HIGH SENSITIVITY)
Troponin I (High Sensitivity): 62 ng/L — ABNORMAL HIGH (ref <18)
Troponin I (High Sensitivity): 64 ng/L — ABNORMAL HIGH (ref <18)

## 2022-09-19 MED ORDER — SODIUM CHLORIDE 0.9 % IV BOLUS: 1000.0000 mL | Freq: Once | INTRAVENOUS | Status: DC

## 2022-09-19 MED ORDER — FUROSEMIDE 10 MG/ML IJ SOLN
20.0000 mg | Freq: Once | INTRAMUSCULAR | Status: AC
  Administered 2022-09-19: 20 mg via INTRAVENOUS
  Filled 2022-09-19: qty 4

## 2022-09-19 MED ORDER — FUROSEMIDE 10 MG/ML IJ SOLN
60.0000 mg | Freq: Once | INTRAMUSCULAR | Status: AC
  Administered 2022-09-19: 60 mg via INTRAVENOUS
  Filled 2022-09-19: qty 8

## 2022-09-19 MED ORDER — NOREPINEPHRINE 4 MG/250ML-% IV SOLN
0.0000 ug/min | INTRAVENOUS | Status: DC
  Filled 2022-09-19: qty 250

## 2022-09-19 MED ORDER — ONDANSETRON HCL 4 MG/2ML IJ SOLN
4.0000 mg | Freq: Once | INTRAMUSCULAR | Status: AC
  Administered 2022-09-19: 4 mg via INTRAVENOUS
  Filled 2022-09-19: qty 2

## 2022-09-19 NOTE — Progress Notes (Signed)
Asked by EDP to evaluate patient for ICU admission. Patient presented with main complaint of defibrillator shocking the patient while sitting outside at his facility for V-tach.  Upon arrival to ED patient alert and responsive, essentially asymptomatic with the exception of progressive dyspnea over the past 3 days. He denies weight gain, chest pain, weakness, vision changes or nausea/ vomiting/ diarrhea.  Lab work significant for BNP > 1500, and there was concern for HFrEF exacerbation, patient received 80 mg of lasix per cardiology recommendations. The patient's BP has been marginal ranging from 100's-90's, with MAP 59-93 and there was a concern that the patient might require vasopressor support. Of note baseline BP outpatient at last visit was 100/63. Upon bedside assessment, patient is A&O x 2-3, which appears to be his baseline- non-toxic appearing- without dyspnea at rest on 1-2 L [place]. Vitals are stable, not on vasopressor support. Recent BP was 96/46 (59) which recycled with this NP bedside to 96/53 (67) then to 108/79 (87) > 112/97, patient does not meet ICU admission criteria at this time.  Please re-consult if needed in the future.       Domingo Pulse [place], AGACNP-BC Acute Care Nurse Practitioner [facility]    [phone] / [phone] Please see Amion for pager details.

## 2022-09-19 NOTE — ED Notes (Signed)
NP told not to start Norepi. Pt currently near baseline. Ordered to start Norepi for SBP <90 or MAP <65.

## 2022-09-19 NOTE — H&P (Signed)
History and Physical    Chief Complaint: Chest pain.   HISTORY OF PRESENT ILLNESS: Carl [name] is an 62 y.o. male brought to the hospital for chest pain.  Patient today was sitting outside and his defibrillator shocked him, for episode of  V tach , chest pain was 9/ 10.  Patient sees Dr. Adam Phenix with [facility].  In the emergency room patient's Medtronic device interrogated on transmission is sent.  Chest pain was in the left upper chest where his defibrillator is.  Patient has been having progressive shortness of breath over the past few days as well. Patient does not report any headaches blurred vision or gait issues nausea vomiting diarrhea abdominal pain dizziness or syncopal issues fevers chills or bleeding.  Pt has  Past Medical History:  Diagnosis Date   Acute pulmonary edema ([place]) 2017   Acute respiratory failure with hypoxia ([place]) 2017   Bipolar 1 disorder (HCC)    CAD (coronary artery disease)    a. Prior RCA stenting; b. 2017 s/p CABG x 2 (LIMA->D1, VG->OM2); b. 04/2022 Demand Isch/Cath: LM mild dzs, LAD 19m, D1 100, LCX mod diff dzs, OM2 100, OM3 50, RCA 40/20p ISR, 85d (iFR 0.99), 50d, RPAV 60, LIMA->D1 small, patent, VG->OM2 large, mild dzs-->Med Rx.   Chronic HFrEF (heart failure with reduced ejection fraction) ([place])    a. 02/2022 Echo: EF 25-30%, mild conc LVH, nl RV fxn, sev dil LA, mildly dil RA, mild MR/AI.   CKD (chronic kidney disease), stage III (HCC)    COPD (chronic obstructive pulmonary disease) ([place])    Depression    Hypertension    Ischemic cardiomyopathy    a. 04/2020 s/p MDT MRI compatible CRT-D (Ser # [id number]); b. 02/2022 Echo: EF 25-30%.   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   Cardiovascular:  Positive  for chest pain.   Allergies  Allergen Reactions   Iodinated Contrast Media Shortness Of Breath   Penicillins Anaphylaxis and Shortness Of Breath    Respiratory  Tolerated cefuroxime on 01/06/16   Strawberry Extract Anaphylaxis   Cefepime Itching    Empiric antibiotic, developed pruritis.    Erythromycin Itching   Sulfa Antibiotics Itching, Nausea And Vomiting and Nausea Only   Past Surgical History:  Procedure Laterality Date   BIV UPGRADE N/A 05/05/2020   Procedure: BIV ICD UPGRADE;  Surgeon: Deboraha Sprang, MD;  Location: [place] [facility];  Service: Cardiovascular;  Laterality: N/A;   CORONARY ARTERY BYPASS GRAFT     INTRAVASCULAR PRESSURE WIRE/FFR STUDY N/A 04/25/2022   Procedure: INTRAVASCULAR PRESSURE WIRE/FFR STUDY;  Surgeon: Nelva Bush, MD;  Location: [place] [facility];  Service: Cardiovascular;  Laterality: N/A;   RIGHT/LEFT HEART CATH AND CORONARY/GRAFT ANGIOGRAPHY N/A 04/25/2022   Procedure: RIGHT/LEFT HEART CATH AND CORONARY/GRAFT ANGIOGRAPHY;  Surgeon: Nelva Bush, MD;  Location: [place] [facility];  Service: Cardiovascular;  Laterality: N/A;     MEDICATIONS: Current Outpatient Medications  Medication Instructions   acetaminophen (TYLENOL) 500 mg, Oral, Every 6 hours PRN   albuterol (PROVENTIL) 2.5 mg, Nebulization, Every 6 hours PRN   albuterol (VENTOLIN HFA) 108 (90 Base) MCG/ACT inhaler 2 puffs, Inhalation, Every 6 hours PRN   amiodarone (PACERONE) 400 mg, Oral, Daily  apixaban (ELIQUIS) 5 MG TABS tablet TAKE 1 TABLET BY MOUTH TWICE A DAY   CHLORASEPTIC 1.4 % LIQD 1 spray, Mouth/Throat, As needed   dapagliflozin propanediol (FARXIGA) 10 mg, Oral, Daily   divalproex (DEPAKOTE ER) 500 mg, Oral, 2 times daily   escitalopram (LEXAPRO) 10 mg, Oral, Daily at bedtime   ezetimibe (ZETIA) 10 mg, Oral, Daily   furosemide (LASIX) 60 mg, Oral, 2 times daily   HYDROcodone-acetaminophen (NORCO/VICODIN) 5-325 MG tablet 1 tablet, Oral, Every 6 hours PRN    Hydrocortisone Acetate 1 % CREA 1 Application, Topical, 3 times daily PRN   hydrOXYzine (ATARAX) 10 mg, Oral, 2 times daily   isosorbide mononitrate (IMDUR) 15 mg, Oral, Daily   levETIRAcetam (KEPPRA) 250 mg, Oral, 2 times daily   levothyroxine (SYNTHROID) 50 mcg, Oral, Every morning   losartan (COZAAR) 12.5 mg, Oral, Daily   metoprolol succinate (TOPROL-XL) 12.5 mg, Oral, Daily at bedtime   Multiple Vitamin (MULTIVITAMIN WITH MINERALS) TABS tablet 1 tablet, Oral, Daily   nitroGLYCERIN (NITROSTAT) 0.4 mg, Sublingual, Every 5 min PRN   omeprazole (PRILOSEC) 20 mg, Oral, Daily   potassium chloride (KLOR-CON M) 10 MEQ tablet 40 mEq, Oral, Daily   QUEtiapine (SEROQUEL) 200 mg, Oral, Daily at bedtime   rosuvastatin (CRESTOR) 10 mg, Oral, Daily   spironolactone (ALDACTONE) 25 mg, Oral, Daily   tiotropium (SPIRIVA) 18 mcg, Inhalation, Daily   traMADol (ULTRAM) 50 mg, Oral, Every 12 hours PRN    norepinephrine (LEVOPHED) Adult infusion      ED Course: Pt in Ed patient is alert awake oriented afebrile with O2 sats of 95% on room air. Vitals:   09/19/22 2205 09/19/22 2230 09/19/22 2300 09/20/22 0014  BP: 108/79 107/64 106/66   Pulse: 62 62 63   Resp: 19 18 (!) 22   Temp:      TempSrc:      SpO2: 99% 95% 95%   Weight:    83 kg  Height:      Due to low blood pressure intensivist was asked to see patient with concerns for need of vasopressor therapy.  However blood pressure stabilized and hospitalist admission requested. Pt given lasix 80 mg iv x 2 in ed.   Total I/O In: -  Out: 350 [Urine:350] SpO2: 95 % Blood work in ed shows: Initial troponin of 62, repeat troponin of 64 Sodium 134, CKD with a creatinine of 2 at baseline EGFR of 37. CBC shows anemia with a hemoglobin of 11.9 RDW of 16.7 platelet count of 162. Chest x-ray shows cardiomegaly and pulmonary vascular congestion. EKG today is showing 67 paced rhythm with a QTc of 548 QRS of 179            Results for orders  placed or performed during the hospital encounter of 09/19/22 (from the past 24 hour(s))  Troponin I (High Sensitivity)     Status: Abnormal   Collection Time: 09/19/22  6:48 PM  Result Value Ref Range   Troponin I (High Sensitivity) 62 (H) <18 ng/L  Comprehensive metabolic panel     Status: Abnormal   Collection Time: 09/19/22  6:48 PM  Result Value Ref Range   Sodium 134 (L) 135 - 145 mmol/L   Potassium 3.5 3.5 - 5.1 mmol/L   Chloride 100 98 - 111 mmol/L   CO2 24 22 - 32 mmol/L   Glucose, Bld 107 (H) 70 - 99 mg/dL   BUN 21 8 - 23 mg/dL   Creatinine, Ser 2.00 (H) 0.61 -  1.24 mg/dL   Calcium 8.6 (L) 8.9 - 10.3 mg/dL   Total Protein 7.1 6.5 - 8.1 g/dL   Albumin 3.4 (L) 3.5 - 5.0 g/dL   AST 30 15 - 41 U/L   ALT 19 0 - 44 U/L   Alkaline Phosphatase 68 38 - 126 U/L   Total Bilirubin 0.8 0.3 - 1.2 mg/dL   GFR, Estimated 37 (L) >60 mL/min   Anion gap 10 5 - 15  CBC with Differential     Status: Abnormal   Collection Time: 09/19/22  6:48 PM  Result Value Ref Range   WBC 7.8 4.0 - 10.5 K/uL   RBC 4.00 (L) 4.22 - 5.81 MIL/uL   Hemoglobin 11.9 (L) 13.0 - 17.0 g/dL   HCT 37.0 (L) 39.0 - 52.0 %   MCV 92.5 80.0 - 100.0 fL   MCH 29.8 26.0 - 34.0 pg   MCHC 32.2 30.0 - 36.0 g/dL   RDW 16.7 (H) 11.5 - 15.5 %   Platelets 162 150 - 400 K/uL   nRBC 0.0 0.0 - 0.2 %   Neutrophils Relative % 69 %   Neutro Abs 5.4 1.7 - 7.7 K/uL   Lymphocytes Relative 14 %   Lymphs Abs 1.1 0.7 - 4.0 K/uL   Monocytes Relative 13 %   Monocytes Absolute 1.0 0.1 - 1.0 K/uL   Eosinophils Relative 2 %   Eosinophils Absolute 0.1 0.0 - 0.5 K/uL   Basophils Relative 1 %   Basophils Absolute 0.1 0.0 - 0.1 K/uL   Immature Granulocytes 1 %   Abs Immature Granulocytes 0.06 0.00 - 0.07 K/uL  Brain natriuretic peptide     Status: Abnormal   Collection Time: 09/19/22  6:48 PM  Result Value Ref Range   B Natriuretic Peptide 1,582.0 (H) 0.0 - 100.0 pg/mL  Troponin I (High Sensitivity)     Status: Abnormal   Collection  Time: 09/19/22  8:42 PM  Result Value Ref Range   Troponin I (High Sensitivity) 64 (H) <18 ng/L  Lactic acid, plasma     Status: None   Collection Time: 09/19/22  8:42 PM  Result Value Ref Range   Lactic Acid, Venous 1.7 0.5 - 1.9 mmol/L   Unresulted Labs (From admission, onward)     Start     Ordered   09/20/22 0019  Ethanol  Add-on,   AD        09/20/22 0018           Pt has received : Orders Placed This Encounter  Procedures   1-3 Lead EKG Interpretation    This order was created via procedure documentation    Standing Status:   Standing    Number of Occurrences:   1   DG Chest Portable 1 View    Standing Status:   Standing    Number of Occurrences:   1    Order Specific Question:   Reason for Exam (SYMPTOM  OR DIAGNOSIS REQUIRED)    Answer:   pacemaker discharge   Comprehensive metabolic panel    Standing Status:   Standing    Number of Occurrences:   1   CBC with Differential    Standing Status:   Standing    Number of Occurrences:   1   Brain natriuretic peptide    Standing Status:   Standing    Number of Occurrences:   1   Ethanol    Standing Status:   Standing    Number of  Occurrences:   1   Diet heart healthy/carb modified Room service appropriate? Yes; Fluid consistency: Thin; Fluid restriction: 1500 mL Fluid    2 gram sodium restriction    Standing Status:   Standing    Number of Occurrences:   1    Order Specific Question:   Diet-HS Snack?    Answer:   Nothing    Order Specific Question:   Room service appropriate?    Answer:   Yes    Order Specific Question:   Fluid consistency:    Answer:   Thin    Order Specific Question:   Fluid restriction:    Answer:   1500 mL Fluid   Interrogate Pacemaker if present    Standing Status:   Standing    Number of Occurrences:   1   Clinical institute withdrawal assessment    For the full pathway, please use the CIWA order sets. This is a one-time assessment order.    Standing Status:   Standing    Number of  Occurrences:   1   Apply Diabetes Mellitus Care Plan    Standing Status:   Standing    Number of Occurrences:   1   STAT CBG when hypoglycemia is suspected. If treated, recheck every 15 minutes after each treatment until CBG >/= 70 mg/dl    Standing Status:   Standing    Number of Occurrences:   1   Refer to Hypoglycemia Protocol Sidebar Report for treatment of CBG < 70 mg/dl    Standing Status:   Standing    Number of Occurrences:   1   Notify physician (specify)    Standing Status:   Standing    Number of Occurrences:   20    Order Specific Question:   Notify Physician    Answer:   for pulse less than 60 and patient is symptomatic    Order Specific Question:   Notify Physician    Answer:   urine output less than 250 ml 4 hours after first diuretic dose OR less than 1000 ml 24 hours after admission    Order Specific Question:   Notify Physician    Answer:   SBP less than 85    Order Specific Question:   Notify Physician    Answer:   for Glycemic Control (SSI) Order Set if diabetic or glucose >140 mg/dl    Order Specific Question:   Notify Physician    Answer:   for O2 requirements exceeding 6 L/min   Initiate Heart Failure Care Plan    Standing Status:   Standing    Number of Occurrences:   1   Daily weights    Teach patient/caregiver to weigh daily and record on weight chart    Standing Status:   Standing    Number of Occurrences:   1   Strict intake and output    Standing Status:   Standing    Number of Occurrences:   1   In and Out Cath    If patient is unable to collect urine after other methods have failed (i.e., frequent toileting, condom caths), may perform In and Out cath. Follow [facility] C[facility]ter Guidelines    Standing Status:   Standing    Number of Occurrences:   20   Patient education (specify):    Review HF Booklet and HF video with patient and caregivers, and document completion. If patient already has HF booklet, give HF Care Note and  weight chart  only.    Standing Status:   Standing    Number of Occurrences:   1   Apply Heart Failure Care Plan    Standing Status:   Standing    Number of Occurrences:   1   STAT CBG when hypoglycemia is suspected. If treated, recheck every 15 minutes after each treatment until CBG >/= 70 mg/dl    Standing Status:   Standing    Number of Occurrences:   1   Refer to Hypoglycemia Protocol Sidebar Report for treatment of CBG < 70 mg/dl    Standing Status:   Standing    Number of Occurrences:   1   Cardiac Monitoring Continuous x 24 hours Indications for use: Sub-acute heart failure    Standing Status:   Standing    Number of Occurrences:   1    Order Specific Question:   Indications for use:    Answer:   Sub-acute heart failure   Maintain IV access    Standing Status:   Standing    Number of Occurrences:   1   Vital signs    Standing Status:   Standing    Number of Occurrences:   1   Notify physician (specify)    Standing Status:   Standing    Number of Occurrences:   77    Order Specific Question:   Notify Physician    Answer:   for pulse less than 55 or greater than 120    Order Specific Question:   Notify Physician    Answer:   for respiratory rate less than 12 or greater than 25    Order Specific Question:   Notify Physician    Answer:   for temperature greater than 100.5 F    Order Specific Question:   Notify Physician    Answer:   for urinary output less than 30 mL/hr for four hours    Order Specific Question:   Notify Physician    Answer:   for systolic BP less than 90 or greater than 0000000, diastolic BP less than 60 or greater than 100    Order Specific Question:   Notify Physician    Answer:   for new hypoxia w/ oxygen saturations < 88%   If patient diabetic or glucose greater than 140 notify physician for Sliding Scale Insulin Orders    Standing Status:   Standing    Number of Occurrences:   20   Initiate Oral Care Protocol    Standing Status:   Standing    Number of Occurrences:    1   Initiate Carrier Fluid Protocol    Standing Status:   Standing    Number of Occurrences:   1   RN may order General Admission PRN Orders utilizing "General Admission PRN medications" (through manage orders) for the following patient needs: allergy symptoms (Claritin), cold sores (Carmex), cough (Robitussin DM), eye irritation (Liquifilm Tears), hemorrhoids (Tucks), indigestion (Maalox), minor skin irritation (Hydrocortisone Cream), muscle pain ([place]), nose irritation (saline nasal spray) and sore throat (Chloraseptic spray).    Standing Status:   Standing    Number of Occurrences:   [phone]   Clinical institute withdrawal assessment    For the full pathway, please use the CIWA order sets. This is a one-time assessment order.    Standing Status:   Standing    Number of Occurrences:   1   Progressive Mobility Protocol: limited/restricted mobility    Standing Status:  Standing    Number of Occurrences:   1    Order Specific Question:   Restrictions    Answer:   Other see comments    Order Specific Question:   Comments    Answer:   OUT OF BED WITH ASSISSTANCE.   Full code    Standing Status:   Standing    Number of Occurrences:   1    Order Specific Question:   By:    Answer:   Other   Consult to hospitalist    Standing Status:   Standing    Number of Occurrences:   1    Order Specific Question:   Place call to:    Answer:   [id number]    Order Specific Question:   Reason for Consult    Answer:   Admit   Consult to Transition of Care Team    Heart failure home health screen and may place order for PT / OT eval and treat if indicated    Standing Status:   Standing    Number of Occurrences:   1    Order Specific Question:   Reason for Consult:    Answer:   SNF placement    Order Specific Question:   Reason for Consult:    Answer:   Other (see comments)   Nutritional services consult    Standing Status:   Standing    Number of Occurrences:   1    Order Specific Question:    Reason for Consult?    Answer:   nutritional goals   OT eval and treat    Standing Status:   Standing    Number of Occurrences:   1   PT eval and treat    Standing Status:   Standing    Number of Occurrences:   1   Pulse oximetry check with vital signs    Standing Status:   Standing    Number of Occurrences:   1   Oxygen therapy Mode or (Route): Nasal cannula; Liters Per Minute: 2; Keep 02 saturation: greater than 92 %    Standing Status:   Standing    Number of Occurrences:   1    Order Specific Question:   Mode or (Route)    Answer:   Nasal cannula    Order Specific Question:   Liters Per Minute    Answer:   2    Order Specific Question:   Keep 02 saturation    Answer:   greater than 92 %   Incentive spirometry    Standing Status:   Standing    Number of Occurrences:   1   EKG 12-Lead    Standing Status:   Standing    Number of Occurrences:   1   Insert peripheral IV    Standing Status:   Standing    Number of Occurrences:   1   Admit to Inpatient (patient's expected length of stay will be greater than 2 midnights or inpatient only procedure)    Standing Status:   Standing    Number of Occurrences:   1    Order Specific Question:   Hospital Area    Answer:   [place] [[id number]]    Order Specific Question:   Level of Care    Answer:   Telemetry Cardiac [[id number]]    Order Specific Question:   Covid Evaluation    Answer:   Asymptomatic - no recent exposure (last  10 days) testing not required    Order Specific Question:   Diagnosis    Answer:   Chest pain [id number]    Order Specific Question:   Admitting Physician    Answer:   Cherylann Ratel    Order Specific Question:   Attending Physician    Answer:   Cherylann Ratel    Order Specific Question:   Certification:    Answer:   I certify this patient will need inpatient services for at least 2 midnights    Order Specific Question:   Estimated Length of Stay    Answer:   2   Seizure  precautions    Standing Status:   Standing    Number of Occurrences:   1    Meds ordered this encounter  Medications   DISCONTD: sodium chloride 0.9 % bolus 1,000 mL   ondansetron (ZOFRAN) injection 4 mg   furosemide (LASIX) injection 20 mg   furosemide (LASIX) injection 60 mg   norepinephrine (LEVOPHED) 4mg  in 24mL (0.016 mg/mL) premix infusion   albuterol (VENTOLIN HFA) 108 (90 Base) MCG/ACT inhaler 2 puff   amiodarone (PACERONE) tablet 400 mg   apixaban (ELIQUIS) tablet 5 mg   divalproex (DEPAKOTE ER) 24 hr tablet 500 mg   escitalopram (LEXAPRO) tablet 10 mg   ezetimibe (ZETIA) tablet 10 mg   levETIRAcetam (KEPPRA) tablet 250 mg   levothyroxine (SYNTHROID) tablet 50 mcg   QUEtiapine (SEROQUEL) tablet 200 mg   rosuvastatin (CRESTOR) tablet 10 mg   furosemide (LASIX) injection 40 mg   insulin aspart (novoLOG) injection 0-5 Units    Order Specific Question:   Correction coverage:    Answer:   HS scale    Order Specific Question:   CBG < 70:    Answer:   Implement Hypoglycemia Standing Orders and refer to Hypoglycemia Standing Orders sidebar report    Order Specific Question:   CBG 70 - 120:    Answer:   0 units    Order Specific Question:   CBG 121 - 150:    Answer:   0 units    Order Specific Question:   CBG 151 - 200:    Answer:   0 units    Order Specific Question:   CBG 201 - 250:    Answer:   2 units    Order Specific Question:   CBG 251 - 300:    Answer:   3 units    Order Specific Question:   CBG 301 - 350:    Answer:   4 units    Order Specific Question:   CBG 351 - 400:    Answer:   5 units    Order Specific Question:   CBG > 400    Answer:   call MD and obtain STAT lab verification   sodium chloride flush (NS) 0.9 % injection 3 mL   OR Linked Order Group    acetaminophen (TYLENOL) tablet 650 mg    acetaminophen (TYLENOL) suppository 650 mg   nicotine (NICODERM CQ - dosed in mg/24 hours) patch 14 mg   insulin aspart (novoLOG) injection 0-9 Units    Order  Specific Question:   Correction coverage:    Answer:   Sensitive (thin, NPO, renal)    Order Specific Question:   CBG < 70:    Answer:   Implement Hypoglycemia Standing Orders and refer to Hypoglycemia Standing Orders sidebar report    Order Specific Question:   CBG  70 - 120:    Answer:   0 units    Order Specific Question:   CBG 121 - 150:    Answer:   1 unit    Order Specific Question:   CBG 151 - 200:    Answer:   2 units    Order Specific Question:   CBG 201 - 250:    Answer:   3 units    Order Specific Question:   CBG 251 - 300:    Answer:   5 units    Order Specific Question:   CBG 301 - 350:    Answer:   7 units    Order Specific Question:   CBG 351 - 400    Answer:   9 units    Order Specific Question:   CBG > 400    Answer:   call MD and obtain STAT lab verification     Admission Imaging : DG Chest Portable 1 View  Result Date: 09/19/2022 CLINICAL DATA:  Pacemaker discharged EXAM: PORTABLE CHEST 1 VIEW COMPARISON:  08/05/2022 FINDINGS: Stable cardiomegaly.  Aortic atherosclerotic calcification. Left chest wall CRT-D. Sternotomy. Cardiac valve prosthesis. Postoperative change of clavicle. Pulmonary vascular congestion. No focal consolidation, pleural effusion, or pneumothorax. IMPRESSION: Cardiomegaly and pulmonary vascular congestion. No change from 08/05/2022. Electronically Signed   By: Placido Sou M.D.   On: 09/19/2022 19:59   Physical Examination: Vitals:   09/19/22 2205 09/19/22 2230 09/19/22 2300 09/20/22 0014  BP: 108/79 107/64 106/66   Pulse: 62 62 63   Temp:      Resp: 19 18 (!) 22   Height:      Weight:    83 kg  SpO2: 99% 95% 95%   TempSrc:      BMI (Calculated):    29.55   Physical Exam Vitals and nursing note reviewed.  Constitutional:      General: He is not in acute distress.    Appearance: Normal appearance. He is not ill-appearing, toxic-appearing or diaphoretic.  HENT:     Head: Normocephalic and atraumatic.     Right Ear: Hearing and  external ear normal.     Left Ear: Hearing and external ear normal.     Nose: No nasal deformity.     Mouth/Throat:     Lips: Pink.     Tongue: No lesions.  Eyes:     Extraocular Movements: Extraocular movements intact.  Cardiovascular:     Rate and Rhythm: Normal rate and regular rhythm.     Pulses: Normal pulses.     Heart sounds: Normal heart sounds.  Pulmonary:     Effort: Pulmonary effort is normal.     Breath sounds: Rales present.  Abdominal:     General: Bowel sounds are normal. There is no distension.     Palpations: Abdomen is soft. There is no mass.     Tenderness: There is no abdominal tenderness. There is no guarding.     Hernia: No hernia is present.  Musculoskeletal:     Right lower leg: No edema.     Left lower leg: No edema.  Skin:    General: Skin is warm.  Neurological:     General: No focal deficit present.     Mental Status: He is alert and oriented to person, place, and time.     Cranial Nerves: Cranial nerves 2-12 are intact.     Motor: Motor function is intact.  Psychiatric:        Attention  and Perception: Attention normal.        Mood and Affect: Mood normal.        Speech: Speech normal.        Behavior: Behavior normal. Behavior is cooperative.        Cognition and Memory: Cognition normal.       Assessment and Plan: * AICD discharge Device interrogated in ED. Awaiting report.  Cardiology consulted.  PRN NTG. Correct underlying CHF / electrolyte issues.  Cont diuretic therapy.   Ventricular tachycardia (HCC) Medtronic device interrogation done in the emergency room. Report pending. Cardiology consulted by EDMD.   Demand ischemia Mild troponin elevation attribute to demand ischemia.   Chest pain Currently chest pain free. PRN NTG.  TNI stable.   Acute on chronic combined systolic and diastolic CHF (congestive heart failure) (HCC) Strict I/O Weight pt daily.  Last Weight  Most recent update: 09/20/2022 12:15 AM    Weight   83 kg (183 lb)             Cont with diuretic therapy.  Cardiology consult.  Echo and additional test per cardiology.    Electrolyte abnormality    Latest Ref Rng & Units 09/19/2022    6:48 PM 09/17/2022   10:43 AM 09/10/2022    1:05 PM  CMP  Glucose 70 - 99 mg/dL 107  75  92   BUN 8 - 23 mg/dL 21  25  31    Creatinine 0.61 - 1.24 mg/dL 2.00  2.23  2.17   Sodium 135 - 145 mmol/L 134  132  133   Potassium 3.5 - 5.1 mmol/L 3.5  4.9  4.2   Chloride 98 - 111 mmol/L 100  99  97   CO2 22 - 32 mmol/L 24  24  25    Calcium 8.9 - 10.3 mg/dL 8.6  9.2  9.2   Total Protein 6.5 - 8.1 g/dL 7.1     Total Bilirubin 0.3 - 1.2 mg/dL 0.8     Alkaline Phos 38 - 126 U/L 68     AST 15 - 41 U/L 30     ALT 0 - 44 U/L 19     Mag level pending.     Essential hypertension Vitals:   09/19/22 1843 09/19/22 1900 09/19/22 2012 09/19/22 2030  BP: (!) 104/57 103/86 113/71 100/63   09/19/22 2150 09/19/22 2205 09/19/22 2230 09/19/22 2300  BP: (!) 96/46 108/79 107/64 106/66  We will hold home BP meds Imdur, losartan, Toprol, Aldactone secondary to low blood pressure and need for diuretic therapy.  Will resume home meds once patient is close to his dry weight or euvolemic.   Hypothyroidism Continue patient's home regimen of levothyroxine at 50 mcg. Patient's thyroid function 10 days ago was abnormal with a TSH of 14.422.  Will repeat TSH and free T4 to further titrate his levothyroxine dose.  Falls Fall precautions. Out of bed with assistance only. PT eval prior to discharge.  History of alcohol abuse Unclear history of alcohol abuse. Fall precautions.  CIWA if needed. Will verify with history.  History of stroke Patient is currently on Eliquis.   Type 2 diabetes mellitus with hyperglycemia (HCC) Glycemic protocol. Currently we will hold Farxiga to avoid hypotension or any blood pressure fluctuation.  Paroxysmal atrial fibrillation (HCC) Currently patient is on amiodarone and Eliquis. EKG  shows a regular rhythm that is dual paced. Correct any electrolytes and underlying thyroid dose changes.   CAD in native artery Continue eliquis.  Continue crestor.   Seizure disorder [employer]) Seizure precautions and will continue patient on Keppra and Depakote.  Tobacco use Nicotine patch. Counseling once patient is stable.  Abdominal aortic aneurysm (AAA) 35 to 39 mm in diameter (HCC) 3.1 cm infrarenal abdominal aortic aneurysm.  Will defer to cardiology for close monitoring and follow up.    DVT prophylaxis:  Eliquis.   Code Status:  Full code   Family Communication:  [name] gobbel : [phone].   Disposition Plan:  TBD.   Consults called:  Cardiology.   Admission status: Inpatient.   Unit/ Expected LOS: Cardiac tele/ 2-3- days.    Para Skeans MD [facility]  6 PM- 2 AM. Please contact me via secure Chat 6 PM-2 AM. [phone]( Pager ) To contact the [facility] Attending or Consulting provider [place] or covering provider during after hours [place], for this patient.   Check the care team in [facility] and look for a) attending/consulting TRH provider listed and b) the [facility] team listed Log into [email or web address] and use [place]'s universal password to access. If you do not have the password, please contact the hospital operator. Locate the [facility] provider you are looking for under [facility] and page to a number that you can be directly reached. If you still have difficulty reaching the provider, please page the [facility] (Director on Call) for the Hospitalists listed on amion for assistance. [email or web address] 09/20/2022, 12:50 AM

## 2022-09-19 NOTE — ED Notes (Addendum)
Medtronic Pacemaker interrogated. Transmission sent

## 2022-09-19 NOTE — ED Triage Notes (Signed)
Pt BIB[facility] from [facility]. Pt reports was sitting outside and that his defibrillator went off. Pt reports 9/10 pain to defibillator site. Facility pt is at baseline - disoriented to time.

## 2022-09-19 NOTE — H&P (Incomplete)
History and Physical    Chief Complaint: Chest pain.   HISTORY OF PRESENT ILLNESS: Carl [name] is an 62 y.o. male brought to the hospital for chest pain.  Patient today was sitting outside and his defibrillator shocked him, chest pain was 9/ 10.  Patient sees Dr. Adam Phenix with [facility].  In the emergency room patient's Medtronic device interrogated on transmission is sent.  Chest pain was in the left upper chest where his defibrillator is.  Patient has been having progressive shortness of breath over the past few days as well. Patient does not report any headaches blurred vision or gait issues nausea vomiting diarrhea abdominal pain dizziness or syncopal issues fevers chills or bleeding.  Pt has  Past Medical History:  Diagnosis Date  . Acute pulmonary edema ([place]) 2017  . Acute respiratory failure with hypoxia ([place]) 2017  . Bipolar 1 disorder ([place])   . CAD (coronary artery disease)    a. Prior RCA stenting; b. 2017 s/p CABG x 2 (LIMA->D1, VG->OM2); b. 04/2022 Demand Isch/Cath: LM mild dzs, LAD 67m D1 100, LCX mod diff dzs, OM2 100, OM3 50, RCA 40/20p ISR, 85d (iFR 0.99), 50d, RPAV 60, LIMA->D1 small, patent, VG->OM2 large, mild dzs-->Med Rx.  . Chronic HFrEF (heart failure with reduced ejection fraction) ([place]    a. 02/2022 Echo: EF 25-30%, mild conc LVH, nl RV fxn, sev dil LA, mildly dil RA, mild MR/AI.  .Marland KitchenCKD (chronic kidney disease), stage III ([place]   . COPD (chronic obstructive pulmonary disease) ([place]   . Depression   . Hypertension   . Ischemic cardiomyopathy    a. 04/2020 s/p MDT MRI compatible CRT-D (Ser # [id number]); b. 02/2022 Echo: EF 25-30%.  . Myocardial infarction ([place]   . NSTEMI (non-ST elevated myocardial infarction) ([place]   . 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 Seizures ([place]    childhood  . Stroke ([place]   . Tobacco abuse    Review of Systems  Respiratory:  Positive for shortness of breath.   Cardiovascular:  Positive for chest  pain.   Allergies  Allergen Reactions  . Iodinated Contrast Media Shortness Of Breath  . Penicillins Anaphylaxis and Shortness Of Breath    Respiratory  Tolerated cefuroxime on 01/06/16  . Strawberry Extract Anaphylaxis  . Cefepime Itching    Empiric antibiotic, developed pruritis.   . Erythromycin Itching  . Sulfa Antibiotics Itching, Nausea And Vomiting and Nausea Only   Past Surgical History:  Procedure Laterality Date  . BIV UPGRADE N/A 05/05/2020   Procedure: BIV ICD UPGRADE;  Surgeon: KDeboraha Sprang MD;  [facility];  Service: Cardiovascular;  Laterality: N/A;  . CORONARY ARTERY BYPASS GRAFT    . INTRAVASCULAR PRESSURE WIRE/FFR STUDY N/A 04/25/2022   Procedure: INTRAVASCULAR PRESSURE WIRE/FFR STUDY;  Surgeon: ENelva Bush MD;  [facility];  Service: Cardiovascular;  Laterality: N/A;  . RIGHT/LEFT HEART CATH AND CORONARY/GRAFT ANGIOGRAPHY N/A 04/25/2022   Procedure: RIGHT/LEFT HEART CATH AND CORONARY/GRAFT ANGIOGRAPHY;  Surgeon: ENelva Bush MD;  [facility];  Service: Cardiovascular;  Laterality: N/A;     MEDICATIONS: Current Outpatient Medications  Medication Instructions  . acetaminophen (TYLENOL) 500 mg, Oral, Every 6 hours PRN  . albuterol (PROVENTIL) 2.5 mg, Nebulization, Every 6 hours PRN  . albuterol (VENTOLIN HFA) 108 (90 Base) MCG/ACT inhaler 2 puffs, Inhalation, Every 6 hours PRN  . amiodarone (PACERONE) 400 mg, Oral, Daily  . apixaban (ELIQUIS) 5 MG TABS  tablet TAKE 1 TABLET BY MOUTH TWICE A DAY  . CHLORASEPTIC 1.4 % LIQD 1 spray, Mouth/Throat, As needed  . dapagliflozin propanediol (FARXIGA) 10 mg, Oral, Daily  . divalproex (DEPAKOTE ER) 500 mg, Oral, 2 times daily  . escitalopram (LEXAPRO) 10 mg, Oral, Daily at bedtime  . ezetimibe (ZETIA) 10 mg, Oral, Daily  . furosemide (LASIX) 60 mg, Oral, 2 times daily  . HYDROcodone-acetaminophen (NORCO/VICODIN) 5-325 MG tablet 1 tablet, Oral, Every 6 hours  PRN  . Hydrocortisone Acetate 1 % CREA 1 Application, Topical, 3 times daily PRN  . hydrOXYzine (ATARAX) 10 mg, Oral, 2 times daily  . isosorbide mononitrate (IMDUR) 15 mg, Oral, Daily  . levETIRAcetam (KEPPRA) 250 mg, Oral, 2 times daily  . levothyroxine (SYNTHROID) 50 mcg, Oral, Every morning  . losartan (COZAAR) 12.5 mg, Oral, Daily  . metoprolol succinate (TOPROL-XL) 12.5 mg, Oral, Daily at bedtime  . Multiple Vitamin (MULTIVITAMIN WITH MINERALS) TABS tablet 1 tablet, Oral, Daily  . nitroGLYCERIN (NITROSTAT) 0.4 mg, Sublingual, Every 5 min PRN  . omeprazole (PRILOSEC) 20 mg, Oral, Daily  . potassium chloride (KLOR-CON M) 10 MEQ tablet 40 mEq, Oral, Daily  . QUEtiapine (SEROQUEL) 200 mg, Oral, Daily at bedtime  . rosuvastatin (CRESTOR) 10 mg, Oral, Daily  . spironolactone (ALDACTONE) 25 mg, Oral, Daily  . tiotropium (SPIRIVA) 18 mcg, Inhalation, Daily  . traMADol (ULTRAM) 50 mg, Oral, Every 12 hours PRN   . norepinephrine (LEVOPHED) Adult infusion      ED Course: Pt in Ed patient is alert awake oriented afebrile with O2 sats of 95% on room air. Vitals:   09/19/22 2152 09/19/22 2205 09/19/22 2230 09/19/22 2300  BP:  108/79 107/64 106/66  Pulse: 65 62 62 63  Resp: (!) '27 19 18 '$ (!) 22  Temp:      TempSrc:      SpO2: 98% 99% 95% 95%  Weight:      Height:      Due to low blood pressure intensivist was asked to see patient with concerns for need of vasopressor therapy.  However blood pressure stabilized and hospitalist admission requested. Pt given lasix 80 mg iv x 2 in ed.   Total I/O In: -  Out: 350 [Urine:350] SpO2: 95 % Blood work in ed shows: Initial troponin of 62. Sodium 134 Results for orders placed or performed during the hospital encounter of 09/19/22 (from the past 24 hour(s))  Troponin I (High Sensitivity)     Status: Abnormal   Collection Time: 09/19/22  6:48 PM  Result Value Ref Range   Troponin I (High Sensitivity) 62 (H) <18 ng/L  Comprehensive metabolic  panel     Status: Abnormal   Collection Time: 09/19/22  6:48 PM  Result Value Ref Range   Sodium 134 (L) 135 - 145 mmol/L   Potassium 3.5 3.5 - 5.1 mmol/L   Chloride 100 98 - 111 mmol/L   CO2 24 22 - 32 mmol/L   Glucose, Bld 107 (H) 70 - 99 mg/dL   BUN 21 8 - 23 mg/dL   Creatinine, Ser 2.00 (H) 0.61 - 1.24 mg/dL   Calcium 8.6 (L) 8.9 - 10.3 mg/dL   Total Protein 7.1 6.5 - 8.1 g/dL   Albumin 3.4 (L) 3.5 - 5.0 g/dL   AST 30 15 - 41 U/L   ALT 19 0 - 44 U/L   Alkaline Phosphatase 68 38 - 126 U/L   Total Bilirubin 0.8 0.3 - 1.2 mg/dL   GFR, Estimated 37 (  L) >60 mL/min   Anion gap 10 5 - 15  CBC with Differential     Status: Abnormal   Collection Time: 09/19/22  6:48 PM  Result Value Ref Range   WBC 7.8 4.0 - 10.5 K/uL   RBC 4.00 (L) 4.22 - 5.81 MIL/uL   Hemoglobin 11.9 (L) 13.0 - 17.0 g/dL   HCT 37.0 (L) 39.0 - 52.0 %   MCV 92.5 80.0 - 100.0 fL   MCH 29.8 26.0 - 34.0 pg   MCHC 32.2 30.0 - 36.0 g/dL   RDW 16.7 (H) 11.5 - 15.5 %   Platelets 162 150 - 400 K/uL   nRBC 0.0 0.0 - 0.2 %   Neutrophils Relative % 69 %   Neutro Abs 5.4 1.7 - 7.7 K/uL   Lymphocytes Relative 14 %   Lymphs Abs 1.1 0.7 - 4.0 K/uL   Monocytes Relative 13 %   Monocytes Absolute 1.0 0.1 - 1.0 K/uL   Eosinophils Relative 2 %   Eosinophils Absolute 0.1 0.0 - 0.5 K/uL   Basophils Relative 1 %   Basophils Absolute 0.1 0.0 - 0.1 K/uL   Immature Granulocytes 1 %   Abs Immature Granulocytes 0.06 0.00 - 0.07 K/uL  Brain natriuretic peptide     Status: Abnormal   Collection Time: 09/19/22  6:48 PM  Result Value Ref Range   B Natriuretic Peptide 1,582.0 (H) 0.0 - 100.0 pg/mL  Troponin I (High Sensitivity)     Status: Abnormal   Collection Time: 09/19/22  8:42 PM  Result Value Ref Range   Troponin I (High Sensitivity) 64 (H) <18 ng/L  Lactic acid, plasma     Status: None   Collection Time: 09/19/22  8:42 PM  Result Value Ref Range   Lactic Acid, Venous 1.7 0.5 - 1.9 mmol/L    Unresulted Labs (From admission,  onward)    None      Pt has received : Orders Placed This Encounter  Procedures  . 1-3 Lead EKG Interpretation    This order was created via procedure documentation    Standing Status:   Standing    Number of Occurrences:   1  . DG Chest Portable 1 View    Standing Status:   Standing    Number of Occurrences:   1    Order Specific Question:   Reason for Exam (SYMPTOM  OR DIAGNOSIS REQUIRED)    Answer:   pacemaker discharge  . Comprehensive metabolic panel    Standing Status:   Standing    Number of Occurrences:   1  . CBC with Differential    Standing Status:   Standing    Number of Occurrences:   1  . Brain natriuretic peptide    Standing Status:   Standing    Number of Occurrences:   1  . Interrogate Pacemaker if present    Standing Status:   Standing    Number of Occurrences:   1  . Consult to hospitalist    Standing Status:   Standing    Number of Occurrences:   1    Order Specific Question:   Place call to:    Answer:   [id number]    Order Specific Question:   Reason for Consult    Answer:   Admit  . EKG 12-Lead    Standing Status:   Standing    Number of Occurrences:   1    Meds ordered this encounter  Medications  . DISCONTD:  sodium chloride 0.9 % bolus 1,000 mL  . ondansetron (ZOFRAN) injection 4 mg  . furosemide (LASIX) injection 20 mg  . furosemide (LASIX) injection 60 mg  . norepinephrine (LEVOPHED) '4mg'$  in 26m (0.016 mg/mL) premix infusion     Admission Imaging : DG Chest Portable 1 View  Result Date: 09/19/2022 CLINICAL DATA:  Pacemaker discharged EXAM: PORTABLE CHEST 1 VIEW COMPARISON:  08/05/2022 FINDINGS: Stable cardiomegaly.  Aortic atherosclerotic calcification. Left chest wall CRT-D. Sternotomy. Cardiac valve prosthesis. Postoperative change of clavicle. Pulmonary vascular congestion. No focal consolidation, pleural effusion, or pneumothorax. IMPRESSION: Cardiomegaly and pulmonary vascular congestion. No change from 08/05/2022. Electronically  Signed   By: TPlacido SouM.D.   On: 09/19/2022 19:59      Physical Examination: Vitals:   09/19/22 2152 09/19/22 2205 09/19/22 2230 09/19/22 2300  BP:  108/79 107/64 106/66  Pulse: 65 62 62 63  Temp:      Resp: (!) '27 19 18 '$ (!) 22  Height:      Weight:      SpO2: 98% 99% 95% 95%  TempSrc:      BMI (Calculated):       Physical Exam Vitals and nursing note reviewed.  Constitutional:      General: He is not in acute distress.    Appearance: Normal appearance. He is not ill-appearing, toxic-appearing or diaphoretic.  HENT:     Head: Normocephalic and atraumatic.     Right Ear: Hearing and external ear normal.     Left Ear: Hearing and external ear normal.     Nose: No nasal deformity.     Mouth/Throat:     Lips: Pink.     Tongue: No lesions.  Eyes:     Extraocular Movements: Extraocular movements intact.  Cardiovascular:     Rate and Rhythm: Normal rate and regular rhythm.     Pulses: Normal pulses.     Heart sounds: Normal heart sounds.  Pulmonary:     Effort: Pulmonary effort is normal.     Breath sounds: Rales present.  Abdominal:     General: Bowel sounds are normal. There is no distension.     Palpations: Abdomen is soft. There is no mass.     Tenderness: There is no abdominal tenderness. There is no guarding.     Hernia: No hernia is present.  Musculoskeletal:     Right lower leg: No edema.     Left lower leg: No edema.  Skin:    General: Skin is warm.  Neurological:     General: No focal deficit present.     Mental Status: He is alert and oriented to person, place, and time.     Cranial Nerves: Cranial nerves 2-12 are intact.     Motor: Motor function is intact.  Psychiatric:        Attention and Perception: Attention normal.        Mood and Affect: Mood normal.        Speech: Speech normal.        Behavior: Behavior normal. Behavior is cooperative.        Cognition and Memory: Cognition normal.       Assessment and Plan: No notes have been  filed under this hospital service. Service: Hospitalist          DVT prophylaxis:  ***  Code Status:  ***  Family Communication:  ***  Disposition Plan:  ***  Consults called:  *** Admission status: ***  Unit/ Expected LOS: ***  Para Skeans MD [facility]  6 PM- 2 AM. Please contact me via secure Chat 6 PM-2 AM. [phone]( Pager ) To contact the [facility] Attending or Consulting provider [place] or covering provider during after hours [place], for this patient.   Check the care team in [facility] and look for a) attending/consulting TRH provider listed and b) the [facility] team listed Log into [email or web address] and use [place]'s universal password to access. If you do not have the password, please contact the hospital operator. Locate the [facility] provider you are looking for under [facility] and page to a number that you can be directly reached. If you still have difficulty reaching the provider, please page the [facility] (Director on Call) for the Hospitalists listed on amion for assistance. [email or web address] 09/19/2022, 11:59 PM

## 2022-09-19 NOTE — ED Provider Notes (Addendum)
[facility] Provider Note   Event Date/Time   First MD Initiated Contact with Patient 09/19/22 1833     (approximate) History  Chest Pain  HPI Carl [name] is a 62 y.o. male with a past medical history of V. tach and pacemaker in place who presents for upper left chest wall pain that he states occurred after he felt his defibrillator discharge.  Patient states that he only felt 1 shock but has had pain in this left upper anterior chest wall since that time.  Patient also endorses shortness of breath that has been worsening over the last 3 days ROS: Patient currently denies any vision changes, tinnitus, difficulty speaking, facial droop, sore throat, abdominal pain, nausea/vomiting/diarrhea, dysuria, or weakness/numbness/paresthesias in any extremity   Physical Exam  Triage Vital Signs: ED Triage Vitals  Enc Vitals Group     BP 09/19/22 1843 (!) 104/57     Pulse Rate 09/19/22 1843 67     Resp 09/19/22 1843 18     Temp 09/19/22 1843 98.6 F (37 C)     Temp Source 09/19/22 1843 Oral     SpO2 09/19/22 1843 96 %     Weight 09/19/22 1844 181 lb 14.1 oz (82.5 kg)     Height 09/19/22 1844 '5\' 6"'$  (1.676 m)     Head Circumference --      Peak Flow --      Pain Score 09/19/22 1844 9     Pain Loc --      Pain Edu? --      Excl. in [place]? --    Most recent vital signs: Vitals:   09/19/22 2230 09/19/22 2300  BP: 107/64 106/66  Pulse: 62 63  Resp: 18 (!) 22  Temp:    SpO2: 95% 95%   General: Awake, oriented x4. CV:  Good peripheral perfusion.  Resp:  Mildly increased effort.  Rales bilaterally Abd:  No distention.  Other:  Elderly overweight Caucasian male laying in bed in mild respiratory distress ED Results / Procedures / Treatments  Labs (all labs ordered are listed, but only abnormal results are displayed) Labs Reviewed  COMPREHENSIVE METABOLIC PANEL - Abnormal; Notable for the following components:      Result Value   Sodium 134 (*)    Glucose, Bld 107  (*)    Creatinine, Ser 2.00 (*)    Calcium 8.6 (*)    Albumin 3.4 (*)    GFR, Estimated 37 (*)    All other components within normal limits  CBC WITH DIFFERENTIAL/PLATELET - Abnormal; Notable for the following components:   RBC 4.00 (*)    Hemoglobin 11.9 (*)    HCT 37.0 (*)    RDW 16.7 (*)    All other components within normal limits  BRAIN NATRIURETIC PEPTIDE - Abnormal; Notable for the following components:   B Natriuretic Peptide 1,582.0 (*)    All other components within normal limits  TROPONIN I (HIGH SENSITIVITY) - Abnormal; Notable for the following components:   Troponin I (High Sensitivity) 62 (*)    All other components within normal limits  TROPONIN I (HIGH SENSITIVITY) - Abnormal; Notable for the following components:   Troponin I (High Sensitivity) 64 (*)    All other components within normal limits  LACTIC ACID, PLASMA   EKG ED ECG REPORT I, Naaman Plummer, the attending physician, personally viewed and interpreted this ECG. Date: 09/19/2022 EKG Time: 1840 Rate: 67 Rhythm: normal sinus rhythm QRS Axis: normal Intervals:  normal ST/T Wave abnormalities: normal Narrative Interpretation: no evidence of acute ischemia RADIOLOGY ED MD interpretation:  Single view portable chest x-ray interpreted independently by me and shows cardiomegaly with pulmonary vascular congestion. -Agree with radiology assessment Official radiology report(s): DG Chest Portable 1 View  Result Date: 09/19/2022 CLINICAL DATA:  Pacemaker discharged EXAM: PORTABLE CHEST 1 VIEW COMPARISON:  08/05/2022 FINDINGS: Stable cardiomegaly.  Aortic atherosclerotic calcification. Left chest wall CRT-D. Sternotomy. Cardiac valve prosthesis. Postoperative change of clavicle. Pulmonary vascular congestion. No focal consolidation, pleural effusion, or pneumothorax. IMPRESSION: Cardiomegaly and pulmonary vascular congestion. No change from 08/05/2022. Electronically Signed   By: Placido Sou M.D.   On:  09/19/2022 19:59   PROCEDURES: Critical Care performed: Yes, see critical care procedure note(s) .1-3 Lead EKG Interpretation  Performed by: Naaman Plummer, MD Authorized by: Naaman Plummer, MD     Interpretation: abnormal     ECG rate:  62   ECG rate assessment: normal     Rhythm: paced     Ectopy: none   CRITICAL CARE Performed by: Naaman Plummer  Total critical care time: 37 minutes  Critical care time was exclusive of separately billable procedures and treating other patients.  Critical care was necessary to treat or prevent imminent or life-threatening deterioration.  Critical care was time spent personally by me on the following activities: development of treatment plan with patient and/or surrogate as well as nursing, discussions with consultants, evaluation of patient's response to treatment, examination of patient, obtaining history from patient or surrogate, ordering and performing treatments and interventions, ordering and review of laboratory studies, ordering and review of radiographic studies, pulse oximetry and re-evaluation of patient's condition.  MEDICATIONS ORDERED IN ED: Medications  norepinephrine (LEVOPHED) '4mg'$  in 281m (0.016 mg/mL) premix infusion (has no administration in time range)  ondansetron (ZOFRAN) injection 4 mg (4 mg Intravenous Given 09/19/22 2011)  furosemide (LASIX) injection 20 mg (20 mg Intravenous Given 09/19/22 2018)  furosemide (LASIX) injection 60 mg (60 mg Intravenous Given 09/19/22 2045)   IMPRESSION / MDM / ASSESSMENT AND PLAN / ED COURSE  I reviewed the triage vital signs and the nursing notes.                             The patient is on the cardiac monitor to evaluate for evidence of arrhythmia and/or significant heart rate changes. Patient's presentation is most consistent with acute presentation with potential threat to life or bodily function. 62year old male presents for chest pain after defibrillator discharged just prior to  arrival. Upon review of patient's defibrillator interrogation, patient has had 2 episodes of ventricular tachycardia that were relieved by defibrillation. Endorses dyspnea and LE edema Denies Non adherence to medication regimen  Workup: ECG, CBC, BMP, Troponin, BNP, CXR Findings: EKG: No STEMI and no evidence of Brugadas sign, delta wave, epsilon wave, significantly prolonged QTc, or malignant arrhythmia. BNP: 1582 CXR: Cardiomegaly with pulmonary vascular congestion Based on history, exam and findings, presentation most consistent with acute on chronic heart failure. Low suspicion for PNA, ACS, tamponade, aortic dissection. Interventions: Oxygen, Diuresis  Reassessment: Symptoms improved in ED with oxygen and diuresis  Disposition (Stable but not significantly improved): Admit to medicine for further monitoring and for improvement of medication regimen to control symptoms.   FINAL CLINICAL IMPRESSION(S) / ED DIAGNOSES   Final diagnoses:  Acute respiratory failure with hypoxia (HCC)  Acute pulmonary edema ([place]   Rx / DC Orders  ED Discharge Orders     None      Note:  This document was prepared using Dragon voice recognition software and may include unintentional dictation errors.   Naaman Plummer, MD 09/19/22 [id number]    Naaman Plummer, MD 09/19/22 [id number]

## 2022-09-19 NOTE — ED Notes (Signed)
Report given to Nina RN.

## 2022-09-20 ENCOUNTER — Inpatient Hospital Stay: Payer: Medicaid Other

## 2022-09-20 ENCOUNTER — Inpatient Hospital Stay (HOSPITAL_COMMUNITY)
Admit: 2022-09-20 | Discharge: 2022-09-20 | Disposition: A | Discharge status: Home / Self Care | Payer: Medicaid Other | Attending: Cardiovascular Disease | Admitting: Cardiovascular Disease

## 2022-09-20 ENCOUNTER — Other Ambulatory Visit: Payer: Self-pay

## 2022-09-20 ENCOUNTER — Telehealth: Payer: Self-pay

## 2022-09-20 DIAGNOSIS — I509 Heart failure, unspecified: Secondary | ICD-10-CM

## 2022-09-20 DIAGNOSIS — F319 Bipolar disorder, unspecified: Secondary | ICD-10-CM | POA: Diagnosis present

## 2022-09-20 DIAGNOSIS — Z515 Encounter for palliative care: Secondary | ICD-10-CM | POA: Diagnosis not present

## 2022-09-20 DIAGNOSIS — G40909 Epilepsy, unspecified, not intractable, without status epilepticus: Secondary | ICD-10-CM | POA: Diagnosis present

## 2022-09-20 DIAGNOSIS — I25118 Atherosclerotic heart disease of native coronary artery with other forms of angina pectoris: Secondary | ICD-10-CM | POA: Diagnosis not present

## 2022-09-20 DIAGNOSIS — Z4502 Encounter for adjustment and management of automatic implantable cardiac defibrillator: Secondary | ICD-10-CM | POA: Diagnosis not present

## 2022-09-20 DIAGNOSIS — E1122 Type 2 diabetes mellitus with diabetic chronic kidney disease: Secondary | ICD-10-CM | POA: Diagnosis present

## 2022-09-20 DIAGNOSIS — Z720 Tobacco use: Secondary | ICD-10-CM

## 2022-09-20 DIAGNOSIS — Z1152 Encounter for screening for COVID-19: Secondary | ICD-10-CM | POA: Diagnosis not present

## 2022-09-20 DIAGNOSIS — I2489 Other forms of acute ischemic heart disease: Secondary | ICD-10-CM | POA: Diagnosis present

## 2022-09-20 DIAGNOSIS — Z7189 Other specified counseling: Secondary | ICD-10-CM | POA: Diagnosis not present

## 2022-09-20 DIAGNOSIS — E878 Other disorders of electrolyte and fluid balance, not elsewhere classified: Secondary | ICD-10-CM

## 2022-09-20 DIAGNOSIS — E1165 Type 2 diabetes mellitus with hyperglycemia: Secondary | ICD-10-CM | POA: Diagnosis present

## 2022-09-20 DIAGNOSIS — I272 Pulmonary hypertension, unspecified: Secondary | ICD-10-CM | POA: Diagnosis present

## 2022-09-20 DIAGNOSIS — R079 Chest pain, unspecified: Secondary | ICD-10-CM | POA: Diagnosis present

## 2022-09-20 DIAGNOSIS — I5043 Acute on chronic combined systolic (congestive) and diastolic (congestive) heart failure: Secondary | ICD-10-CM

## 2022-09-20 DIAGNOSIS — Z66 Do not resuscitate: Secondary | ICD-10-CM | POA: Diagnosis present

## 2022-09-20 DIAGNOSIS — I472 Ventricular tachycardia, unspecified: Secondary | ICD-10-CM | POA: Diagnosis present

## 2022-09-20 DIAGNOSIS — I130 Hypertensive heart and chronic kidney disease with heart failure and stage 1 through stage 4 chronic kidney disease, or unspecified chronic kidney disease: Secondary | ICD-10-CM | POA: Diagnosis present

## 2022-09-20 DIAGNOSIS — I255 Ischemic cardiomyopathy: Secondary | ICD-10-CM | POA: Diagnosis present

## 2022-09-20 DIAGNOSIS — F1010 Alcohol abuse, uncomplicated: Secondary | ICD-10-CM | POA: Diagnosis present

## 2022-09-20 DIAGNOSIS — Z79890 Hormone replacement therapy (postmenopausal): Secondary | ICD-10-CM | POA: Diagnosis not present

## 2022-09-20 DIAGNOSIS — I4901 Ventricular fibrillation: Secondary | ICD-10-CM

## 2022-09-20 DIAGNOSIS — E039 Hypothyroidism, unspecified: Secondary | ICD-10-CM | POA: Diagnosis present

## 2022-09-20 DIAGNOSIS — J810 Acute pulmonary edema: Secondary | ICD-10-CM | POA: Diagnosis not present

## 2022-09-20 DIAGNOSIS — I480 Paroxysmal atrial fibrillation: Secondary | ICD-10-CM | POA: Diagnosis present

## 2022-09-20 DIAGNOSIS — J449 Chronic obstructive pulmonary disease, unspecified: Secondary | ICD-10-CM | POA: Diagnosis present

## 2022-09-20 DIAGNOSIS — Z7901 Long term (current) use of anticoagulants: Secondary | ICD-10-CM | POA: Diagnosis not present

## 2022-09-20 DIAGNOSIS — N1832 Chronic kidney disease, stage 3b: Secondary | ICD-10-CM | POA: Diagnosis present

## 2022-09-20 DIAGNOSIS — I2089 Other forms of angina pectoris: Secondary | ICD-10-CM | POA: Diagnosis not present

## 2022-09-20 DIAGNOSIS — Z8616 Personal history of COVID-19: Secondary | ICD-10-CM | POA: Diagnosis not present

## 2022-09-20 DIAGNOSIS — J9601 Acute respiratory failure with hypoxia: Secondary | ICD-10-CM | POA: Diagnosis not present

## 2022-09-20 DIAGNOSIS — R45851 Suicidal ideations: Secondary | ICD-10-CM | POA: Diagnosis present

## 2022-09-20 DIAGNOSIS — I7143 Infrarenal abdominal aortic aneurysm, without rupture: Secondary | ICD-10-CM | POA: Diagnosis present

## 2022-09-20 LAB — ECHOCARDIOGRAM LIMITED
Calc EF: 17.5 %
Est EF: <20
Height: 66 in
Radius: 1.7 cm
S' Lateral: 6.2 cm
Single Plane A2C EF: 20.7 %
Single Plane A4C EF: 12.8 %
Weight: 3379.21 oz

## 2022-09-20 LAB — GLUCOSE, CAPILLARY
Glucose-Capillary: 124 mg/dL — ABNORMAL HIGH (ref 70–99)
Glucose-Capillary: 112 mg/dL — ABNORMAL HIGH (ref 70–99)
Glucose-Capillary: 100 mg/dL — ABNORMAL HIGH (ref 70–99)
Glucose-Capillary: 102 mg/dL — ABNORMAL HIGH (ref 70–99)
Glucose-Capillary: 138 mg/dL — ABNORMAL HIGH (ref 70–99)

## 2022-09-20 LAB — AMMONIA: Ammonia: 38 umol/L — ABNORMAL HIGH (ref 9–35)

## 2022-09-20 LAB — ETHANOL: Alcohol, Ethyl (B): <10 mg/dL (ref <10)

## 2022-09-20 MED ORDER — FUROSEMIDE 10 MG/ML IJ SOLN
40.0000 mg | Freq: Once | INTRAMUSCULAR | Status: AC
  Administered 2022-09-20: 40 mg via INTRAVENOUS
  Filled 2022-09-20: qty 4

## 2022-09-20 MED ORDER — ALBUTEROL SULFATE (2.5 MG/3ML) 0.083% IN NEBU
2.5000 mg | INHALATION_SOLUTION | Freq: Four times a day (QID) | RESPIRATORY_TRACT | Status: DC | PRN
  Administered 2022-09-21: 2.5 mg via RESPIRATORY_TRACT
  Filled 2022-09-20: qty 3

## 2022-09-20 MED ORDER — LEVETIRACETAM 250 MG PO TABS
250.0000 mg | ORAL_TABLET | Freq: Two times a day (BID) | ORAL | Status: DC
  Administered 2022-09-20 – 2022-09-24 (×10): 250 mg via ORAL
  Filled 2022-09-20 (×10): qty 1

## 2022-09-20 MED ORDER — AMIODARONE HCL 200 MG PO TABS
400.0000 mg | ORAL_TABLET | Freq: Two times a day (BID) | ORAL | Status: DC
  Administered 2022-09-20 – 2022-09-24 (×8): 400 mg via ORAL
  Filled 2022-09-20 (×8): qty 2

## 2022-09-20 MED ORDER — MIDODRINE HCL 5 MG PO TABS
2.5000 mg | ORAL_TABLET | Freq: Three times a day (TID) | ORAL | Status: DC
  Administered 2022-09-20 – 2022-09-24 (×12): 2.5 mg via ORAL
  Filled 2022-09-20 (×12): qty 1

## 2022-09-20 MED ORDER — ADULT MULTIVITAMIN W/MINERALS CH
1.0000 | ORAL_TABLET | Freq: Every day | ORAL | Status: DC
  Administered 2022-09-20 – 2022-09-24 (×5): 1 via ORAL
  Filled 2022-09-20 (×5): qty 1

## 2022-09-20 MED ORDER — INSULIN ASPART 100 UNIT/ML IJ SOLN: 0.0000 [IU] | Freq: Every day | INTRAMUSCULAR | Status: DC

## 2022-09-20 MED ORDER — NICOTINE 14 MG/24HR TD PT24
14.0000 mg | MEDICATED_PATCH | Freq: Every day | TRANSDERMAL | Status: DC
  Administered 2022-09-20 – 2022-09-24 (×5): 14 mg via TRANSDERMAL
  Filled 2022-09-20 (×5): qty 1

## 2022-09-20 MED ORDER — FUROSEMIDE 10 MG/ML IJ SOLN
80.0000 mg | Freq: Two times a day (BID) | INTRAMUSCULAR | Status: DC
  Administered 2022-09-20 – 2022-09-23 (×6): 80 mg via INTRAVENOUS
  Filled 2022-09-20 (×6): qty 8

## 2022-09-20 MED ORDER — ACETAMINOPHEN 325 MG RE SUPP: 650.0000 mg | Freq: Four times a day (QID) | RECTAL | Status: DC | PRN

## 2022-09-20 MED ORDER — FUROSEMIDE 10 MG/ML IJ SOLN
40.0000 mg | Freq: Two times a day (BID) | INTRAMUSCULAR | Status: DC
  Administered 2022-09-20: 40 mg via INTRAVENOUS
  Filled 2022-09-20: qty 4

## 2022-09-20 MED ORDER — LEVOTHYROXINE SODIUM 50 MCG PO TABS
50.0000 ug | ORAL_TABLET | Freq: Every day | ORAL | Status: DC
  Administered 2022-09-20 – 2022-09-24 (×5): 50 ug via ORAL
  Filled 2022-09-20 (×5): qty 1

## 2022-09-20 MED ORDER — INSULIN ASPART 100 UNIT/ML IJ SOLN
0.0000 [IU] | Freq: Three times a day (TID) | INTRAMUSCULAR | Status: DC
  Administered 2022-09-21 (×2): 1 [IU] via SUBCUTANEOUS
  Administered 2022-09-22 (×2): 2 [IU] via SUBCUTANEOUS
  Administered 2022-09-23 (×2): 1 [IU] via SUBCUTANEOUS
  Filled 2022-09-20 (×6): qty 1

## 2022-09-20 MED ORDER — ESCITALOPRAM OXALATE 10 MG PO TABS
10.0000 mg | ORAL_TABLET | Freq: Every day | ORAL | Status: DC
  Administered 2022-09-20 – 2022-09-23 (×5): 10 mg via ORAL
  Filled 2022-09-20 (×6): qty 1

## 2022-09-20 MED ORDER — AMIODARONE HCL 200 MG PO TABS
400.0000 mg | ORAL_TABLET | Freq: Every day | ORAL | Status: DC
  Administered 2022-09-20: 400 mg via ORAL
  Filled 2022-09-20: qty 2

## 2022-09-20 MED ORDER — DIVALPROEX SODIUM ER 250 MG PO TB24
500.0000 mg | ORAL_TABLET | Freq: Two times a day (BID) | ORAL | Status: DC
  Administered 2022-09-20 – 2022-09-24 (×10): 500 mg via ORAL
  Filled 2022-09-20 (×10): qty 2

## 2022-09-20 MED ORDER — APIXABAN 5 MG PO TABS
5.0000 mg | ORAL_TABLET | Freq: Two times a day (BID) | ORAL | Status: DC
  Administered 2022-09-20 – 2022-09-24 (×10): 5 mg via ORAL
  Filled 2022-09-20 (×10): qty 1

## 2022-09-20 MED ORDER — EZETIMIBE 10 MG PO TABS
10.0000 mg | ORAL_TABLET | Freq: Every day | ORAL | Status: DC
  Administered 2022-09-20 – 2022-09-24 (×5): 10 mg via ORAL
  Filled 2022-09-20 (×5): qty 1

## 2022-09-20 MED ORDER — ROSUVASTATIN CALCIUM 10 MG PO TABS
10.0000 mg | ORAL_TABLET | Freq: Every day | ORAL | Status: DC
  Administered 2022-09-20 – 2022-09-23 (×5): 10 mg via ORAL
  Filled 2022-09-20 (×5): qty 1

## 2022-09-20 MED ORDER — ENSURE ENLIVE PO LIQD
237.0000 mL | Freq: Two times a day (BID) | ORAL | Status: DC
  Administered 2022-09-21 – 2022-09-24 (×6): 237 mL via ORAL

## 2022-09-20 MED ORDER — SODIUM CHLORIDE 0.9% FLUSH
3.0000 mL | Freq: Two times a day (BID) | INTRAVENOUS | Status: DC
  Administered 2022-09-20 – 2022-09-24 (×10): 3 mL via INTRAVENOUS

## 2022-09-20 MED ORDER — ACETAMINOPHEN 325 MG PO TABS: 650.0000 mg | ORAL_TABLET | Freq: Four times a day (QID) | ORAL | Status: DC | PRN

## 2022-09-20 MED ORDER — QUETIAPINE FUMARATE 200 MG PO TABS
200.0000 mg | ORAL_TABLET | Freq: Every day | ORAL | Status: DC
  Administered 2022-09-20 – 2022-09-23 (×5): 200 mg via ORAL
  Filled 2022-09-20 (×5): qty 1

## 2022-09-20 NOTE — Progress Notes (Signed)
*  PRELIMINARY RESULTS* Echocardiogram 2D Echocardiogram has been performed.  Carl [name] 09/20/2022, 9:44 AM

## 2022-09-20 NOTE — Assessment & Plan Note (Addendum)
Seizure precautions - continue patient on Keppra and Depakote.

## 2022-09-20 NOTE — Assessment & Plan Note (Addendum)
Continue patient's home regimen of levothyroxine at 50 mcg for now Patient's thyroid function 10 days ago was abnormal with a TSH of 14.422.  Will repeat TSH and free T4 to further titrate his levothyroxine dose

## 2022-09-20 NOTE — Hospital Course (Addendum)
62 y.o. male with a history of CAD s/p CABG, ICM, HFrEF, VT/VF s/p CRT-D, HTN, HL, PAF, tob abuse, COPD, CKD II-III, stroke, and bipolar disorder admitted after ICD discharge   3/15: cardio c/s, Palliative care c/s, echo 3/16: Legal guardian interested in hospice evaluation.  Referral sent 3/17: Metolazone added along with potassium replacement 3/18: Added metoprolol, left ankle x-ray negative for acute pathology

## 2022-09-20 NOTE — Assessment & Plan Note (Addendum)
continue amiodarone for rate control and Eliquis for anticoagulation

## 2022-09-20 NOTE — Assessment & Plan Note (Addendum)
Continue eliquis, crestor.

## 2022-09-20 NOTE — Assessment & Plan Note (Signed)
Unclear history of alcohol abuse. Fall precautions.  CIWA if needed. Will verify with history.

## 2022-09-20 NOTE — Assessment & Plan Note (Addendum)
Holding meds as BP is soft.

## 2022-09-20 NOTE — Progress Notes (Signed)
*  PRELIMINARY RESULTS* Echocardiogram 2D Echocardiogram has been performed.  Carl [name] 09/20/2022, 9:50 AM

## 2022-09-20 NOTE — Assessment & Plan Note (Addendum)
3.1 cm infrarenal abdominal aortic aneurysm.  Monitoring for now

## 2022-09-20 NOTE — Assessment & Plan Note (Addendum)
EF < 20%, severe global hypokinesis on echo this admission (previous echo showed 25-30%) Continue IV lasix 80 BID and Midodrine  Net IO Since Admission: -1,790 mL [09/20/22 1911]  IV Lasix 80 twice daily, midodrine 2.5 3 times daily Advance CHF team c/sed - appreciate dr bensimhon input. Check abd [place] to r/o ascites as abd is distended

## 2022-09-20 NOTE — Assessment & Plan Note (Addendum)
Mild troponin elevation due to demand ischemia.

## 2022-09-20 NOTE — Assessment & Plan Note (Addendum)
-  Nicotine patch 

## 2022-09-20 NOTE — Progress Notes (Signed)
Initial Nutrition Assessment  DOCUMENTATION CODES:   Obesity unspecified  INTERVENTION:   -Downgrade diet to dysphagia 2 with 1.5 L fluid restriction for ease of intake -MVI with minerals daily -Ensure Enlive po BID, each supplement provides 350 kcal and 20 grams of protein  NUTRITION DIAGNOSIS:   Increased nutrient needs related to chronic illness (CHF) as evidenced by estimated needs.  GOAL:   Patient will meet greater than or equal to 90% of their needs  MONITOR:   PO intake, Supplement acceptance  REASON FOR ASSESSMENT:   Consult Assessment of nutrition requirement/status  ASSESSMENT:   Pt admitted for chest pain; pt experienced his defibrillator shocking him, had an epidode of V-tach, and 9/10 chest pain. PMH includes COPD, NSTEMI, acute pulmonary edema, HTN, MI, stroke, seizures, CHF, CKD, and CAD.  Pt admitted with AICD discharge.   3/14- device interrogated in ED  Reviewed I/O's: -360 ml x 24 hours  UOP: 600 ml x 24 hours   Pt familiar to this RD from multiple prior admissions. He is a resident of [name].   Pt currently on a heart healthy, carb modified diet with 1.5 L fluid restriction.   Spoke with pt at bedside, who reports feeling poorly today. Pt reports decreased appetite for 2-3 days PTA due to feeling poorly. He reports that at baseline his intake waxes and wanes. He shares that he often does not like the food that is served in the facility ("it looks and tastes like garbage") and often does not eat much of it. He also drinks water and soda PTA, but unable to quantify how much. Pt follows a low sodium diet at ALF, and complains that the food often does not have much flavor. He reports he refused breakfast today because he his nauseous.   Noted pt with multiple missing teeth; he has an upcoming appointment with a dentist per his report. He admits to difficulty chewing secondary to lack of teeth and is agreeable to diet downgrade for ease of  intake.   Pt denies any weight loss. He reports his UBW is around 160#. Noted wt gain likely related to fluid. Suspect edema may be masking true weight loss as well as fat and muscle depletions.   Discussed importance of good meal and supplement intake to promote healing. Pt amenable to supplements.   Case discussed with heart failure navigator and advanced heart failure MD.   Medications reviewed and include lasix and keppra.   Lab Results  Component Value Date   HGBA1C 6.6 (H) 03/25/2022   PTA DM medications are 10 mg farxiga daily.   Labs reviewed: Na: 134, CBGS: 112-124 (inpatient orders for glycemic control are 0-5 units insulin aspart daily at bedtime, and 0-9 units insulin aspart TID with meals).    NUTRITION - FOCUSED PHYSICAL EXAM:  Flowsheet Row Most Recent Value  Orbital Region No depletion  Upper Arm Region No depletion  Thoracic and Lumbar Region No depletion  Buccal Region No depletion  Temple Region No depletion  Clavicle Bone Region No depletion  Clavicle and Acromion Bone Region No depletion  Scapular Bone Region No depletion  Dorsal Hand Mild depletion  Patellar Region No depletion  Anterior Thigh Region No depletion  Posterior Calf Region No depletion  Edema (RD Assessment) Mild  Hair Reviewed  Eyes Reviewed  Mouth Reviewed  Skin Reviewed  Nails Reviewed       Diet Order:   Diet Order  Diet heart healthy/carb modified Room service appropriate? Yes; Fluid consistency: Thin; Fluid restriction: 1500 mL Fluid  Diet effective now                   EDUCATION NEEDS:   Education needs have been addressed  Skin:  Skin Assessment: Reviewed RN Assessment  Last BM:  09/19/22  Height:   Ht Readings from Last 1 Encounters:  09/19/22 5\' 6"  (1.676 m)    Weight:   Wt Readings from Last 1 Encounters:  09/20/22 95.8 kg    Ideal Body Weight:  64.5 kg  BMI:  Body mass index is 34.09 kg/m.  Estimated Nutritional Needs:   Kcal:   [id number]  Protein:  [phone] grams  Fluid:  1.5 L    Loistine Chance, RD, LDN, [place] Registered Dietitian II Certified Diabetes Care and Education Specialist Please refer to [facility] for RD and/or RD on-call/weekend/after hours pager

## 2022-09-20 NOTE — TOC Initial Note (Signed)
Transition of Care [facility]) - Initial/Assessment Note    Patient Details  Name: Carl [name] MRN: [id number] Date of Birth: 25-Mar-1961  Transition of Care [facility]) CM/SW Contact:    Carl Chroman, LCSW Phone Number: 09/20/2022, 4:16 PM  Clinical Narrative:  CSW is familiar with patient from previous admission. Carl [name] at [place] is his legal guardian. He is from [facility]. Per chart review, he has had 2L home oxygen since February ([employer]). No further concerns. CSW will continue to follow patient and his guardian for support and facilitate return to ALF once medically stable. The ALF staff typically transports at discharge.                 Expected Discharge Plan: Assisted Living Barriers to Discharge: Continued Medical Work up   Patient Goals and CMS Choice            Expected Discharge Plan and Services     Post Acute Care Choice: NA Living arrangements for the past 2 months: [place]                                      Prior Living Arrangements/Services Living arrangements for the past 2 months: [place] Lives with:: Facility Resident Patient language and need for interpreter reviewed:: Yes        Need for Family Participation in Patient Care: Yes (Comment) Care giver support system in place?: Yes (comment) Current home services: DME Criminal Activity/Legal Involvement Pertinent to Current Situation/Hospitalization: No - Comment as needed  Activities of Daily Living Home Assistive Devices/Equipment: Cane (specify quad or straight), Walker (specify type), Wheelchair (per pt report) ADL Screening (condition at time of admission) Patient's cognitive ability adequate to safely complete daily activities?: Yes Is the patient deaf or have difficulty hearing?: No Does the patient have difficulty seeing, even when wearing glasses/contacts?: No Does the patient have difficulty concentrating, remembering, or making  decisions?: Yes Patient able to express need for assistance with ADLs?: Yes Does the patient have difficulty dressing or bathing?: Yes Independently performs ADLs?: No Does the patient have difficulty walking or climbing stairs?: Yes Weakness of Legs: None Weakness of Arms/Hands: None  Permission Sought/Granted Permission sought to share information with : Facility Sport and exercise psychologist, Guardian    Share Information with NAME: Carl [name]  Permission granted to share info w AGENCY: [name]  Permission granted to share info w Relationship: Legal Guardian (DSS)  Permission granted to share info w Contact Information: [phone]  Emotional Assessment       Orientation: : Oriented to Self, Oriented to Place Alcohol / Substance Use: Not Applicable Psych Involvement: No (comment)  Admission diagnosis:  Acute pulmonary edema (HCC) [J81.0] Acute respiratory failure with hypoxia (HCC) [J96.01] Chest pain [R07.9] Patient Active Problem List   Diagnosis Date Noted   Electrolyte abnormality 09/20/2022   Acute pulmonary edema ([place]) 09/20/2022   AICD discharge 04/27/2022   Non-ST elevation (NSTEMI) myocardial infarction [facility])    Ventricular fibrillation ([place])    Ventricular tachycardia ([place]) 04/23/2022   Acute respiratory failure with hypoxia ([place]) 04/22/2022   Hypokalemia 04/22/2022   Demand ischemia    Type 2 diabetes mellitus with hyperglycemia ([place]) 03/25/2022   Overweight (BMI 25.0-29.9) 03/23/2022   COVID-19 virus infection 03/23/2022   Respiratory failure ([place]) 03/22/2022   Dilated cardiomyopathy (HCC)    Chest pain    COPD  exacerbation ([place]) 03/14/2022   Seizure disorder ([place]) 03/14/2022   Dyspnea    Congestive heart failure ([place]) 02/15/2022   Anxiety 02/15/2022   Tobacco use 02/15/2022   Elevated partial thromboplastin time (PTT) 02/15/2022   Abdominal pain 02/15/2022   HFrEF (heart failure with reduced ejection fraction) ([place])    Weakness    COPD with  acute exacerbation ([place]) 05/23/2021   Paroxysmal atrial fibrillation ([place]) 05/23/2021   Chronic anticoagulation 05/23/2021   Abdominal aortic aneurysm (AAA) 35 to 39 mm in diameter ([place]) 05/23/2021   Ischemic cardiomyopathy 05/22/2020   Chronic combined systolic and diastolic heart failure ([place]) 05/22/2020   HCAP (healthcare-associated pneumonia) 01/24/2020   Hyperkalemia 01/24/2020   Acute on chronic combined systolic and diastolic CHF (congestive heart failure) ([place]) 01/24/2020   History of seizure 01/24/2020   HLD (hyperlipidemia) 01/24/2020   Ascites due to alcoholic cirrhosis ([place])    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/18/2019   Elevated troponin 11/18/2019   Hypoxia 11/18/2019   Acute renal failure superimposed on stage 3a chronic kidney disease ([place]) 11/18/2019   CAD in native artery 05/15/2019   History of stroke 05/15/2019   Homicidal ideation 05/15/2019   Hypothyroidism 05/15/2019   Constipation 05/06/2019   Scrotal mass 05/06/2019   Abnormal urine odor 05/06/2019   Bipolar 1 disorder ([place]) 12/23/2018   Chronic pain of left knee 09/17/2018   Atypical chest pain 10/11/2017   Polypharmacy 08/05/2017   Cerebrovascular accident (CVA) due to stenosis of left middle cerebral artery ([place]) 06/26/2017   Urinary retention due to benign prostatic hyperplasia 06/22/2017   Falls 06/20/2017   Risk for falls 02/19/2017   Dyslipidemia 11/19/2016   Peripheral artery disease ([place]) 08/28/2016   Claudication ([place]) 08/09/2016   History of alcohol abuse 08/09/2016   History of drug dependence/abuse ([place]) 08/09/2016   Severe single current episode of major depressive disorder, without psychotic features ([place]) 08/09/2016   Mitral insufficiency 01/04/2016   Prediabetes 12/29/2015   Essential hypertension 12/29/2015   Fracture of left clavicle 07/13/2012   PCP:  Pcp, No Pharmacy:   [place], [place] - [address]  [place] [address] Phone: [phone] Fax: [phone]     Social Determinants of Health (SDOH) Social History: [place]: No Food Insecurity (09/20/2022)  Housing: Low Risk  (09/20/2022)  Transportation Needs: No Transportation Needs (09/20/2022)  Utilities: Not At Risk (09/20/2022)  Alcohol Screen: Low Risk  (05/15/2019)  Tobacco Use: High Risk (09/19/2022)   SDOH Interventions:     Readmission Risk Interventions    08/05/2022    8:36 AM 03/15/2022    2:44 PM  Readmission Risk Prevention Plan  Transportation Screening Complete   HRI or [place]  Complete  Social Work Consult for [place] Planning/Counseling  Complete  Palliative Care Screening  Not Applicable  Medication Review Press photographer) Complete Complete  PCP or Specialist appointment within 3-5 days of discharge Complete   HRI or College Complete   [place] Not Applicable

## 2022-09-20 NOTE — Assessment & Plan Note (Addendum)
Currently chest pain free. PRN NTG.  Monitor on tele

## 2022-09-20 NOTE — Progress Notes (Signed)
   ReDS Vest / Clip - 09/20/22 0900       ReDS Vest / Clip   Station Marker C    Ruler Value 30    ReDS Value Range High volume overload    ReDS Actual Value 46

## 2022-09-20 NOTE — Consult Note (Addendum)
   Heart Failure Nurse Navigator Note  HFrEF 25 to 30%.  Left ventricular internal cavity is mildly dilated.  Right ventricular systolic function is normal.  Left atrium is severely dilated.  Severe mitral regurgitation.  Mild mitral regurgitation.  Appears that mitral valve has been repaired/replaced.  Limited echocardiogram performed on this admission, results are currently pending.  He presented to the emergency room after patient received a shock from his defibrillator.  He states that he had noted progressive shortness of breath over the last few days.  BNP was 1582.  Chest x-ray revealed cardiomegaly with vascular congestion.  Comorbidities:  Coronary artery disease with prior stenting, coronary artery bypass grafting x 2 vessels Chronic kidney disease stage III COPD Depression Hypertension Seizure Continued tobacco abuse  Medications:  Amiodarone 400 mg daily Apixaban 5 mg 2 times a day Zetia 10 mg daily Furosemide 80 mg IV twice a day Levothyroxine 50 mcg daily Midodrine 2.5 mg 3 times a day with meals NicoDerm patch 14 mg daily Crestor 10 mg at bedtime  Labs:  Sodium 134, potassium 3.5, chloride 100, CO2 24, BUN 21, creatinine 2.0, albumin 3.4, estimated GFR 37, troponin 64, hemoglobin 11.9, hematocrit 37. Weight 95.8 kg Intake 240 mL Output 600 mL  Initial meeting with patient, lying quietly in bed currently on O2 per nasal cannula.  He is almost lying flat in no acute distress.  He states prior to admission that he had been short of breath and did not have an appetite.  He also complains of the facility's food.  He states that some of the staff members will let him use a little salt on his food.  Attempted to explain that most foods contain some amount of sodium that is not in his best interest not  to be adding salt.  Attempted to explain the relationship between salt/sodium and fluids. He states he likes to drink broth, explained that is high in sodium content.   Patient appears to have poor insight.  In discussing fluid restriction, patient is unaware of how much he drinks throughout the day.  States that he mostly drinks sodas. Given parameters of no more that 8 cups or 64 ounces.  He states that he continues to smoke cigarettes, can smoke anywhere from 4 to 12 cigarettes daily.  Reds vest reading obtained, resulted  46%.  This patient was discussed with dietary and cardiology.  He has follow-up appointment in the outpatient heart failure clinic on March 21 at 10:30 AM.  He has a 3% no-show which is 2 out of 75 appointments.  He had no further questions.  Pricilla Riffle RN CHFN

## 2022-09-20 NOTE — Consult Note (Signed)
Cardiology Consultation   Patient ID: Carl [name] MRN: [id number]; DOB: 1961-05-27  Admit date: 09/19/2022 Date of Consult: 09/20/2022  PCP:  Kathyrn Lass   Carl [name] Providers Cardiologist:  Kathlyn Sacramento, MD  Electrophysiologist:  Virl Axe, MD  {   Patient Profile:   Carl [name] is a 62 y.o. male with a hx of CAD status post CABG in 2017, ischemic cardiomyopathy EF of 30 to 35% status post ICD 2000 sober to go through, paroxysmal A-fib on Eliquis, CVA, bipolar disorder, and smoker who is being seen 09/20/2022 for the evaluation of ICD shock at the request of Dr. Manuella Ghazi.  History of Present Illness:   Carl [name] was hospitalized in May 2021 with respiratory distress in setting of COPD and heart failure.  Echo showed LVEF 25 to 30%.  He underwent Lexiscan Myoview which showed evidence of prior infarct with no ischemia.  He was hospitalized in July 2021 for sepsis secondary to pneumonia.  He underwent BiV ICD upgrade by Dr. Caryl Comes in October 2021.  Echo in February 2022 showed an EF of 30 to 35% with mild to moderate MR.  He was hospitalized in August 2023 with shortness of breath and hypoxia, appropriate for breath.  BNP 888 and troponin 68.  He was treated with IV heparin and IV Lasix.  Echo showed LVEF 25 to 30%, elevated LVEDP, severely dilated left atrium, mild MR.  The patient was discharged from [facility] ER to behavioral health center 03/06/2022 for suicidal homicidal ideation.  Patient went back to the hospital 03/22/2022 for shortness of breath and admitted with elevated troponin/non-STEMI, acute CHF, COPD exacerbation, COVID-19 virus, acute respiratory failure, hypotension, acute renal failure, paroxysmal A-fib.  He was treated with IV heparin for 48 hours.  Beta-blocker was restarted and digoxin was added for A-fib.  Lisinopril was held for soft blood pressure.  He was treated with IV Lasix diuresis 8.8 L.  Midodrine and digoxin was stopped at discharge.  Patient was  admitted again in October 2023 for shortness of breath requiring supplemental oxygen and ICD firing.  Telemetry showed frequent short runs of V. tach.  Digoxin was discontinued and he was started on amiodarone.  Patient was treated with IV Lasix and sent home on Lasix 60 mg daily.  Echo showed LVEF 25 to 30%.  Right and left heart cath showed moderate to severe multivessel CAD, moderate to severe pulmonary artery pressures.  He was continued on aspirin and statin.  Patient was readmitted in November 2023 for AICD discharge.  He was loaded with amiodarone and diuresed.  He was admitted in January 2024 for chest pain and VT.  Patient was smoking at the time of VT.  He presented to [facility] and was cardioverted and then transferred to [facility].  Patient was no longer on amiodarone.  Patient was started on IV amiodarone, and later transition to mexiletine 50 mg twice daily.  Patient was seen by Dr. Caryl Comes 09/10/2022.  Patient was on amiodarone.  On 3/12, the patient was checking into the cardiology office when he had seizure-like activity. Workup was normal. Device download showed VT with successful HC therapy. Dr. Caryl Comes was notified and the patient was scheduled to be seen in the office.  Patient presented to [facility] ED 09/19/2022 with chest pain.  Patient reported upper left chest wall pain after defibrillator discharge.   In the ER blood pressure 104/57, pulse rate 67 bpm, respiratory rate 18, afebrile, 96% O2.  Labs showed sodium 134,  glucose 107, serum creatinine 2, calcium 8.6, albumin 3.4, hemoglobin 11.9.  BNP 1582.  High-sensitivity troponin 62>64. CXR showed cardiomegaly and pulmonary vascular congestion.  Past Medical History:  Diagnosis Date   Acute pulmonary edema ([place]) 2017   Acute respiratory failure with hypoxia ([place]) 2017   Bipolar 1 disorder (HCC)    CAD (coronary artery disease)    a. Prior RCA stenting; b. 2017 s/p CABG x 2 (LIMA->D1, VG->OM2); b. 04/2022 Demand  Isch/Cath: LM mild dzs, LAD 41m, D1 100, LCX mod diff dzs, OM2 100, OM3 50, RCA 40/20p ISR, 85d (iFR 0.99), 50d, RPAV 60, LIMA->D1 small, patent, VG->OM2 large, mild dzs-->Med Rx.   Chronic HFrEF (heart failure with reduced ejection fraction) ([place])    a. 02/2022 Echo: EF 25-30%, mild conc LVH, nl RV fxn, sev dil LA, mildly dil RA, mild MR/AI.   CKD (chronic kidney disease), stage III (HCC)    COPD (chronic obstructive pulmonary disease) ([place])    Depression    Hypertension    Ischemic cardiomyopathy    a. 04/2020 s/p MDT MRI compatible CRT-D (Ser # [id number]); b. 02/2022 Echo: EF 25-30%.   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   Seizures ([place])    childhood   Stroke [facility])    Tobacco abuse     Past Surgical History:  Procedure Laterality Date   BIV UPGRADE N/A 05/05/2020   Procedure: BIV ICD UPGRADE;  Surgeon: Deboraha Sprang, MD;  Location: [place] CV LAB;  Service: Cardiovascular;  Laterality: N/A;   CORONARY ARTERY BYPASS GRAFT     INTRAVASCULAR PRESSURE WIRE/FFR STUDY N/A 04/25/2022   Procedure: INTRAVASCULAR PRESSURE WIRE/FFR STUDY;  Surgeon: Nelva Bush, MD;  Location: [place] CV LAB;  Service: Cardiovascular;  Laterality: N/A;   RIGHT/LEFT HEART CATH AND CORONARY/GRAFT ANGIOGRAPHY N/A 04/25/2022   Procedure: RIGHT/LEFT HEART CATH AND CORONARY/GRAFT ANGIOGRAPHY;  Surgeon: Nelva Bush, MD;  Location: [place] CV LAB;  Service: Cardiovascular;  Laterality: N/A;     Home Medications:  Prior to Admission medications   Medication Sig Start Date End Date Taking? Authorizing Provider  acetaminophen (TYLENOL) 500 MG tablet Take 500 mg by mouth every 6 (six) hours as needed for mild pain.    [provider]  albuterol (PROVENTIL) (2.5 MG/3ML) 0.083% nebulizer solution Take 3 mLs (2.5 mg total) by nebulization every 6 (six) hours as needed for  wheezing or shortness of breath. 05/25/22   Loletha Grayer, MD  albuterol (VENTOLIN HFA) 108 (90 Base) MCG/ACT inhaler Inhale 2 puffs into the lungs every 6 (six) hours as needed for wheezing. 05/25/22   Loletha Grayer, MD  amiodarone (PACERONE) 400 MG tablet Take 400 mg by mouth daily.    [provider]  apixaban (ELIQUIS) 5 MG TABS tablet TAKE 1 TABLET BY MOUTH TWICE A DAY 01/18/21   Deboraha Sprang, MD  [facility] 1.4 % LIQD Use as directed 1 spray in the mouth or throat as needed. 06/26/22   [provider]  dapagliflozin propanediol (FARXIGA) 10 MG TABS tablet Take 1 tablet (10 mg total) by mouth daily. 05/01/22   Fritzi Mandes, MD  divalproex (DEPAKOTE ER) 500 MG 24 hr tablet Take 1 tablet (500 mg total) by mouth 2 (two) times daily. 05/24/19   Clapacs, Madie Reno, MD  escitalopram (LEXAPRO) 10 MG tablet Take 1 tablet (10 mg total) by mouth at bedtime. 05/24/19   Clapacs,  Madie Reno, MD  ezetimibe (ZETIA) 10 MG tablet Take 1 tablet (10 mg total) by mouth daily. 04/01/22   Annita Brod, MD  furosemide (LASIX) 20 MG tablet Take 3 tablets (60 mg total) by mouth 2 (two) times daily. 05/25/22   Loletha Grayer, MD  HYDROcodone-acetaminophen (NORCO/VICODIN) 5-325 MG tablet Take 1 tablet by mouth every 6 (six) hours as needed for severe pain. 05/25/22   Loletha Grayer, MD  Hydrocortisone Acetate 1 % CREA Apply 1 Application topically 3 (three) times daily as needed (itching). 05/15/22   [provider]  hydrOXYzine (ATARAX) 10 MG tablet Take 10 mg by mouth 2 (two) times daily. 12/24/21   [provider]  isosorbide mononitrate (IMDUR) 30 MG 24 hr tablet Take 0.5 tablets (15 mg total) by mouth daily. 08/09/22   Thurnell Lose, MD  levETIRAcetam (KEPPRA) 250 MG tablet Take 1 tablet (250 mg total) by mouth 2 (two) times daily. 03/24/20   Harvest Dark, MD  levothyroxine (SYNTHROID) 50 MCG tablet Take 50 mcg by mouth every morning. 06/30/22   [provider]  losartan (COZAAR) 25 MG tablet Take 12.5 mg by mouth daily.    [provider]  metoprolol succinate (TOPROL-XL) 25 MG 24 hr tablet Take 0.5 tablets (12.5 mg total) by mouth at bedtime. 04/30/22   Fritzi Mandes, MD  Multiple Vitamin (MULTIVITAMIN WITH MINERALS) TABS tablet Take 1 tablet by mouth daily. 05/01/22   Fritzi Mandes, MD  nitroGLYCERIN (NITROSTAT) 0.4 MG SL tablet Place 1 tablet (0.4 mg total) under the tongue every 5 (five) minutes as needed for chest pain. 05/25/22   Loletha Grayer, MD  omeprazole (PRILOSEC) 20 MG capsule Take 20 mg by mouth daily.    [provider]  potassium chloride (KLOR-CON M) 10 MEQ tablet Take 4 tablets (40 mEq total) by mouth daily. 09/10/22   Deboraha Sprang, MD  QUEtiapine (SEROQUEL) 200 MG tablet Take 200 mg by mouth at bedtime.    [provider]  rosuvastatin (CRESTOR) 10 MG tablet Take 1 tablet (10 mg total) by mouth daily. Patient taking differently: Take 10 mg by mouth at bedtime. 05/16/22   Alisa Graff, FNP  spironolactone (ALDACTONE) 25 MG tablet Take 1 tablet (25 mg total) by mouth daily. 05/26/22   Loletha Grayer, MD  tiotropium (SPIRIVA) 18 MCG inhalation capsule Place 18 mcg into inhaler and inhale daily.    [provider]  traMADol (ULTRAM) 50 MG tablet Take 50 mg by mouth every 12 (twelve) hours as needed for moderate pain.    [provider]    Inpatient Medications: Scheduled Meds:  amiodarone  400 mg Oral Daily   apixaban  5 mg Oral BID   divalproex  500 mg Oral BID   escitalopram  10 mg Oral QHS   ezetimibe  10 mg Oral Daily   furosemide  40 mg Intravenous BID   insulin aspart  0-5 Units Subcutaneous QHS   insulin aspart  0-9 Units Subcutaneous TID WC   levETIRAcetam  250 mg Oral BID   levothyroxine  50 mcg Oral Q0600   nicotine  14 mg Transdermal Daily   QUEtiapine  200 mg Oral QHS   rosuvastatin  10 mg Oral QHS   sodium chloride flush  3 mL Intravenous Q12H   Continuous  Infusions:  PRN Meds: acetaminophen **OR** acetaminophen, albuterol  Allergies:    Allergies  Allergen Reactions   Iodinated Contrast Media Shortness Of Breath   Penicillins Anaphylaxis and  Shortness Of Breath    Respiratory  Tolerated cefuroxime on 01/06/16   Strawberry Extract Anaphylaxis   Cefepime Itching    Empiric antibiotic, developed pruritis.    Erythromycin Itching   Sulfa Antibiotics Itching, Nausea And Vomiting and Nausea Only    Social History:   Social History   Socioeconomic History   Marital status: Widowed    Spouse name: Not on file   Number of children: Not on file   Years of education: Not on file   Highest education level: Not on file  Occupational History   Not on file  Tobacco Use   Smoking status: Every Day    Packs/day: 0.25    Years: 0.00    Additional pack years: 0.00    Total pack years: 0.00    Types: Cigarettes   Smokeless tobacco: Never  Vaping Use   Vaping Use: Never used  Substance and Sexual Activity   Alcohol use: Not Currently   Drug use: Never   Sexual activity: Not Currently    Birth control/protection: Abstinence  Other Topics Concern   Not on file  Social History Narrative   Not on file   Social Determinants of Health   Financial Resource Strain: Not on file  Food Insecurity: No Food Insecurity (09/20/2022)   Hunger Vital Sign    Worried About Running Out of Food in the Last Year: Never true    Ran Out of Food in the Last Year: Never true  Transportation Needs: No Transportation Needs (09/20/2022)   PRAPARE - Hydrologist (Medical): No    Lack of Transportation (Non-Medical): No  Physical Activity: Not on file  Stress: Not on file  Social Connections: Not on file  Intimate Partner Violence: Not At Risk (09/20/2022)   Humiliation, Afraid, Rape, and Kick questionnaire    Fear of Current or Ex-Partner: No    Emotionally Abused: No    Physically Abused: No    Sexually Abused: No    Family  History:    Family History  Problem Relation Age of Onset   Cancer Mother        "femal cancer"   Prostate cancer Father      ROS:  Please see the history of present illness.   All other ROS reviewed and negative.     Physical Exam/Data:   Vitals:   09/20/22 0020 09/20/22 0040 09/20/22 0135 09/20/22 0504  BP: 113/61 104/71 110/66 99/61  Pulse: 64  60 65  Resp: (!) 25 (!) 22 18 16   Temp:   97.6 F (36.4 C) 98.4 F (36.9 C)  TempSrc:    Oral  SpO2: 96% 97% 98% 97%  Weight:      Height:        Intake/Output Summary (Last 24 hours) at 09/20/2022 0751 Last data filed at 09/20/2022 0600 Gross per 24 hour  Intake 240 ml  Output 600 ml  Net -360 ml      09/20/2022   12:14 AM 09/19/2022    6:44 PM 09/17/2022   10:23 AM  Last 3 Weights  Weight (lbs) 183 lb 181 lb 14.1 oz 181 lb 14.1 oz  Weight (kg) 83.008 kg 82.5 kg 82.5 kg     Body mass index is 29.54 kg/m.  General:  Well nourished, well developed, in no acute distress HEENT: normal Neck: +JVD Vascular: No carotid bruits; Distal pulses 2+ bilaterally Cardiac:  normal S1, S2; RRR; no murmur  Lungs:  crackles Abd:  distended, no hepatomegaly  Ext: no edema Musculoskeletal:  No deformities, BUE and BLE strength normal and equal Skin: warm and dry  Neuro:  CNs 2-12 intact, no focal abnormalities noted Psych:  Normal affect   EKG:  The EKG was personally reviewed and demonstrates:  A-sensed V paced rhythm, 67bpm Telemetry:  Telemetry was personally reviewed and demonstrates:  A sensed, V paced, HR 60s  Relevant CV Studies:  R/L cardiac cath 04/2022 Conclusions: Moderate-severe multivessel coronary artery disease, including 50% proximal LAD stenosis (mid/distal LAD are small with prominent first septal branch consistent with "double-barrel LAD), occlusion of large proximal D1 (distal vessel is supplied by patent LIMA graft), moderate diffuse LCx disease, occluded OM2 branch supplied by ectatic but patent SVG, and  sequential proximal through distal RCA disease of up to 80-90% that is not hemodynamically significant (iFR = 0.99). Widely patent LIMA-D1. Ectatic but patent SVG-OM2. Patent overlapping proximal through distal RCA stents with mild in-stent restenosis in the proximal segment. Moderately elevated left heart filling pressures (LVEDP 25-30 mmHg, PWCP 30 mmHg with prominent V-waves). Moderately-severely elevated right heart and pulmonary artery pressures (mean RA 15 mmHg, mean PAP 42 mmHg). Low normal to mildly reduced Fick cardiac output/index (Fick CO/CI, 4.9 L/min, 2.4 L/min/m^2).   Recommendations: Continue medical therapy; no target for PCI.  I suspect elevated troponin represents supply-demand mismatch in the setting of chronic ischemic heart disease, acute on chronic HFrEF, and recent VT/VF with ICD shock. Aggressive diuresis as blood pressure and renal function allow. Escalate goal-directed medical therapy for HFrEF due to ischemic cardiomyopathy as blood pressure and renal function allow. Aggressive secondary prevention of coronary artery disease. Restart IV heparin 2 hours after TR band removal; transition back to apixaban as soon as tomorrow if there is no evidence of bleeding or vascular injury from catheterization.   Findings and recommendations were discussed by telephone with the patient's legal guardian.   Nelva Bush, MD [facility]   Echo 02/2022 1. Left ventricular ejection fraction, by estimation, is 25 to 30%. The  left ventricle has severely decreased function. The left ventricle  demonstrates regional wall motion abnormalities (see scoring  diagram/findings for description). The left  ventricular internal cavity size was moderately dilated. There is mild  concentric left ventricular hypertrophy. Left ventricular diastolic  parameters are indeterminate. Elevated left ventricular end-diastolic  pressure.   2. Right ventricular systolic function is normal. The right  ventricular  size is normal.   3. Left atrial size was severely dilated.   4. Right atrial size was mildly dilated.   5. The mitral valve is normal in structure. Mild mitral valve  regurgitation. No evidence of mitral stenosis.   6. The aortic valve is tricuspid. Aortic valve regurgitation is mild. No  aortic stenosis is present.   7. The inferior vena cava is normal in size with <50% respiratory  variability, suggesting right atrial pressure of 8 mmHg.   Laboratory Data:  High Sensitivity Troponin:   Recent Labs  Lab 09/19/22 1848 09/19/22 2042  TROPONINIHS 62* 64*     Chemistry Recent Labs  Lab 09/17/22 1043 09/19/22 1848  NA 132* 134*  K 4.9 3.5  CL 99 100  CO2 24 24  GLUCOSE 75 107*  BUN 25* 21  CREATININE 2.23* 2.00*  CALCIUM 9.2 8.6*  GFRNONAA 32* 37*  ANIONGAP 9 10    Recent Labs  Lab 09/19/22 1848  PROT 7.1  ALBUMIN 3.4*  AST 30  ALT 19  ALKPHOS 68  BILITOT  0.8   Lipids No results for input(s): "CHOL", "TRIG", "HDL", "LABVLDL", "LDLCALC", "CHOLHDL" in the last 168 hours.  Hematology Recent Labs  Lab 09/17/22 1043 09/19/22 1848  WBC 7.7 7.8  RBC 4.33 4.00*  HGB 13.1 11.9*  HCT 39.3 37.0*  MCV 90.8 92.5  MCH 30.3 29.8  MCHC 33.3 32.2  RDW 17.0* 16.7*  PLT 208 162   Thyroid No results for input(s): "TSH", "FREET4" in the last 168 hours.  BNP Recent Labs  Lab 09/19/22 1848  BNP 1,582.0*    DDimer No results for input(s): "DDIMER" in the last 168 hours.   Radiology/Studies:  DG Chest Portable 1 View  Result Date: 09/19/2022 CLINICAL DATA:  Pacemaker discharged EXAM: PORTABLE CHEST 1 VIEW COMPARISON:  08/05/2022 FINDINGS: Stable cardiomegaly.  Aortic atherosclerotic calcification. Left chest wall CRT-D. Sternotomy. Cardiac valve prosthesis. Postoperative change of clavicle. Pulmonary vascular congestion. No focal consolidation, pleural effusion, or pneumothorax. IMPRESSION: Cardiomegaly and pulmonary vascular congestion. No change from  08/05/2022. Electronically Signed   By: Placido Sou M.D.   On: 09/19/2022 19:59     Assessment and Plan:   AICD discharge VT - VT/Vfib arrest 04/2022, cath at that time showed stable CAD - recurrent VT 05/2022 K 2.7 at that time - recurrent VT storm 07/2021, smoking at the time, trialed on mexiletine - recurrent VT 3/13 and 3/15 treated with HV therapy, now admitted. Optivol showing volume overload - seen 3/5 and was on amiodarone - he is at a facility and he has been taking 400mg  daily - Keep K>4 and Mag>2 - will need to get EP input for device settings and medication recommendations  Acute on chronic combined systolic and diastolic CHF - long standing history of low EF 25-30%  - most recent echo 07/2022 showed LVEF 25-30%, severe MR - BNP 1582 and CXR with pulmonary vascular congestion - PTA Farxiga 10mg  daily, spironolactone 25mg  daily, Losartan 12.5mg  daily, Toprol 12.5mg  daily>held for low pressures - IV lasix 40mg  BID>increase to IV 80mg  BID and midodrine for low pressures - PTA lasix 60mg  BID - volume overloaded on exam - strict I/Os, daily weights and monitor kidney function function - ACHF team consulted  CAD - cath 04/2022 showed 2/2 patent grafts with moderate RCA ISR and distal bifurcation disease with normal iFR - chest pain after AICD discharge - HS troponin 62>64, minimally elevated with flat trend - no plan for further ischemic work-up - continue Crestor and Zetia - Imdur held for low pressures  Paroxysmal Afib - continue Eliquis 5mg  BID  Tobacco use - cessation recommended - nicotine patch per IM  For questions or updates, please contact [place] Please consult [email or web address] for contact info under    Signed, Simora Dingee Ninfa Meeker, PA-C  09/20/2022 7:51 AM

## 2022-09-20 NOTE — Consult Note (Signed)
Consultation Note Date: 09/20/2022 at 1510  Patient Name: Carl [name]  DOB: 08-12-1960  MRN: [id number]  Age / Sex: 62 y.o., male  PCP: Pcp, No Referring Physician: Max Sane, MD  Reason for Consultation: Establishing goals of care  HPI/Patient Profile: 62 y.o. male  with past medical history of coronary artery disease, CABG 2017, ischemic cardiomyopathy ejection fraction 25 to 30%, ICD, paroxysmal atrial fibrillation on Eliquis, history of stroke, bipolar disorder, smoke  admitted on 09/19/2022 with progressive SOB and ICD discharge.   In ED, patient's AICD was interrogated, which revealed VT that degenerated to VF resulting in ICD shock of which the patient was unaware.  PMT was consulted to discuss [place].  Pt is familiar to me as I saw patient during September 2023 hospitalization.    Clinical Assessment and Goals of Care: I have reviewed medical records including EPIC notes, labs and imaging, assessed the patient and then met with patient at bedside to discuss diagnosis prognosis, GOC, EOL wishes, disposition and options. Patient has poor insight into his current medical condition. He was alert and oriented to person, place and time but unsure of why he is in the hospital.  I discussed AICD and discharge with patient.  Patient cannot recall ICD discharging.  I attempted to discuss patient's functional and nutritional status prior to admission.  Patient unable to provide information regarding current medical situation.  He was able to confirm he is not experiencing any pain, discomfort, nausea vomiting diarrhea, or other symptoms of concern at this time.  Patient has a legal guardian Carl [name]. After assessing the patient, I spoke with Carl [name] over the phone.   I introduced Palliative Medicine as specialized medical care for people living with serious illness. It focuses on providing relief from the  symptoms and stress of a serious illness. The goal is to improve quality of life for both the patient and the family.  We discussed patient's current illness and what it means in the larger context of patient's on-going co-morbidities.  Carl [name] shares she knows patient had a seizure earlier in the week but was unclear on why patient was transferred to hospital today.  Brief medical update given.  Discussed that patient is being followed by cardiology.  Patient is approaching end-stage heart failure and is not a candidate for advanced therapies due to his ongoing tobacco use, poor mobility/functional status, and poor insight into his current medical status.  Natural disease trajectory discussed.  We discussed that patient continues to smoke, not follow a low-salt diet, and drinks sodas frequently without concern for overall fluid intake daily.  I attempted to elicit values and goals of care important to the patient.Carl [name] shares she has attempted to speak with patient about being proactive in his own health to avoid future hospitalizations and complications.  She shares patient has not been compliant with making necessary lifestyle modifications.    The difference between aggressive medical intervention and comfort care was considered.  I discussed that if patient continues to be noncompliant with recommendations that  he is likely facing repeated hospitalizations and further complications due to poorly controlled heart failure.    The difference between palliative outpatient services and hospice services discussed in detail.  I discussed that patient is not currently a candidate for hospice as he is not approaching end-of-life.  However, patient could become a candidate for hospice services if patient wants to age in place, eat and drink what he would like, and continue to be noncompliant with medications.   Advance directives, concepts specific to code status, artificial feeding and hydration, and  rehospitalization were considered and discussed. Full code and full scope remain. Carl [name] shares she would need to reviewe notes from attending and have her supervisor make any changes to Carl [name].    Questions and concerns were addressed. Carl [name] was encouraged to call with questions or concerns.   PMT contact info given to [place].  Carl [name] also made aware that there is no in person PMT provider over the weekend.  PMT plans to follow-up with patient and Carl [name] on Monday.  Primary Decision Maker LEGAL GUARDIAN  Physical Exam Vitals reviewed.  Constitutional:      General: He is not in acute distress.    Appearance: He is not ill-appearing.  HENT:     Head: Normocephalic.  Cardiovascular:     Rate and Rhythm: Normal rate.  Abdominal:     Palpations: Abdomen is soft.  Skin:    General: Skin is warm and dry.  Neurological:     Mental Status: He is alert.     Comments: Oriented to self and place  Psychiatric:        Mood and Affect: Mood is not anxious.        Behavior: Behavior is not agitated.     Palliative Assessment/Data: 30-40%     Thank you for this consult. Palliative medicine will continue to follow and assist holistically.   Time Total: 75 minutes Greater than 50%  of this time was spent counseling and coordinating care related to the above assessment and plan.  Signed by: Carl Hawks, DNP, FNP-BC Palliative Medicine    Please contact Palliative Medicine Team phone at [phone] for questions and concerns.  For individual provider: See Carl [name]

## 2022-09-20 NOTE — Telephone Encounter (Signed)
CLE:  ED to hospital admission Alert for sustained VT with successful HV therapy Event occurred 4/14 @ 17:36, EGM shows onset of sustained VT, mean HR 150, ATP delivered x6 with no changes, followed by HV therapy @ 35J converting to regular AP/BiV pace.  Duration 57min 37sec. There is one NSVT @ 11:10 and 1 monitored VT @ 17:35 Route to triage for awareness LA  Patient currently admitted to hospital.

## 2022-09-20 NOTE — Consult Note (Signed)
Advanced Heart Failure Team Consult Note   Primary Physician: Pcp, No PCP-Cardiologist:  Kathlyn Sacramento, MD  Reason for Consultation: Heart failure  HPI:    Carl [name] is seen today for evaluation of HF at the request of Dr. Rockey Situ.   Carl [name] is a 62 y.o. male with a history of CAD s/p CABG, ICM, HFrEF, VT/VF s/p CRT-D, HTN, HL, PAF, tob abuse, COPD, CKD II-III, stroke, and bipolar d/o.    Underwent RCA stenting followed by CABG x2 with a LIMA-D1, SVG-> OM-2. Underwent CRT-D in October 2021.    Echo 8/23 EF 25-30% with mild concentric LVH, normal RV function, severe LAE, and mild MR/AI.  He was hospitalized in August 2023 with ADHF.     Seen 04/12/22 in Cardiology Clinic with NYHA III-IIB symptoms. Both beta-blocker and ACE inhibitor discontinued secondary to relative hypotension.  He was readmitted October 16 with ADHF.  In the ER, he was tachycardic with mild troponin elevation of 168.  He was treated with Solu-Medrol and DuoNebs.  Device interrogation was notable for VT that degenerated to VF resulting in ICD shock of which, the patient was unaware.  He was seen by EP and placed on amio with recommendation for catheterization.  Cath performed and revealed 2 of 2 patent grafts with moderate but patent RCA grafts and distal RCA bifurcation disease up to 85% with normal iFR.  Medical therapy was recommended and he was subsequently discharged.   Readmitted in 11/23 with recurrent VT and IDC shock. Amio increased. K supplemented. Diuresed   Readmitted 1/24 for chest pain and VT.  Patient was smoking at the time of VT.  He presented to [facility] and was cardioverted and then transferred to [facility].  Patient was no longer taking amiodarone.  Patient was started on IV amiodarone, and later transition to mexiletine 50 mg twice daily.  Readmitted after having recurrent VT. Now on amio and mexilitene  We are consulted due to volume overload. Patient is poor historian.  Says he feels fine. Apparently drinks a lot of soda at is SNF. Denies SOB, orthopnea or PND. BNP 846 -> 1582. Getting 40 IV lasix. SBPs have been in 90s. Renal function stable.     Review of Systems: [y] = yes, [ ]  = no   General: Weight gain [ ] ; Weight loss [ ] ; Anorexia [ ] ; Fatigue [ ] ; Fever [ ] ; Chills [ ] ; Weakness Blue.Reese ]  Cardiac: Chest pain/pressure Blue.Reese ]; Resting SOB [ ] ; Exertional SOB Blue.Reese ]; Orthopnea [ ] ; Pedal Edema [ y]; Palpitations [ ] ; Syncope [ ] ; Presyncope [ ] ; Paroxysmal nocturnal dyspnea[ ]   Pulmonary: Cough [ ] ; Wheezing[ ] ; Hemoptysis[ ] ; Sputum [ ] ; Snoring [ ]   GI: Vomiting[ ] ; Dysphagia[ ] ; Melena[ ] ; Hematochezia [ ] ; Heartburn[ ] ; Abdominal pain [ ] ; Constipation [ ] ; Diarrhea [ ] ; BRBPR [ ]   GU: Hematuria[ ] ; Dysuria [ ] ; Nocturia[ ]   Vascular: Pain in legs with walking [ ] ; Pain in feet with lying flat [ ] ; Non-healing sores [ ] ; Stroke [ ] ; TIA [ ] ; Slurred speech [ ] ;  Neuro: Headaches[ ] ; Vertigo[ ] ; Seizures[ ] ; Paresthesias[ ] ;Blurred vision [ ] ; Diplopia [ ] ; Vision changes [ ]   Ortho/Skin: Arthritis [ y]; Joint pain Blue.Reese ]; Muscle pain [ ] ; Joint swelling [ ] ; Back Pain [ ] ; Rash [ ]   Psych: Depression[ ] ; Anxiety[ ]   Heme: Bleeding problems [ ] ; Clotting disorders [ ] ; Anemia [ ]   Endocrine: Diabetes [ ] ; Thyroid dysfunction[ ]   Home Medications Prior to Admission medications   Medication Sig Start Date End Date Taking? Authorizing Provider  albuterol (PROVENTIL) (2.5 MG/3ML) 0.083% nebulizer solution Take 3 mLs (2.5 mg total) by nebulization every 6 (six) hours as needed for wheezing or shortness of breath. 03/21/22  Yes Jennye Boroughs, MD  amiodarone (PACERONE) 400 MG tablet Take 1 tablet (400 mg total) by mouth daily. 05/01/22  Yes Fritzi Mandes, MD  apixaban (ELIQUIS) 5 MG TABS tablet TAKE 1 TABLET BY MOUTH TWICE A DAY 01/18/21  Yes Deboraha Sprang, MD  dapagliflozin propanediol (FARXIGA) 10 MG TABS tablet Take 1 tablet (10 mg total) by mouth daily. 05/01/22   Yes Fritzi Mandes, MD  divalproex (DEPAKOTE ER) 500 MG 24 hr tablet Take 1 tablet (500 mg total) by mouth 2 (two) times daily. 05/24/19  Yes Clapacs, Madie Reno, MD  escitalopram (LEXAPRO) 10 MG tablet Take 1 tablet (10 mg total) by mouth at bedtime. 05/24/19  Yes Clapacs, Madie Reno, MD  ezetimibe (ZETIA) 10 MG tablet Take 1 tablet (10 mg total) by mouth daily. 04/01/22  Yes Annita Brod, MD  furosemide (LASIX) 20 MG tablet Take 3 tablets (60 mg) by mouth once daily 04/12/22  Yes Furth, Cadence H, PA-C  Hydrocortisone Acetate 1 % CREA Apply TID 05/15/22  Yes [provider]  levETIRAcetam (KEPPRA) 250 MG tablet Take 1 tablet (250 mg total) by mouth 2 (two) times daily. 03/24/20  Yes Harvest Dark, MD  levothyroxine (SYNTHROID) 25 MCG tablet Take 1 tablet (25 mcg total) by mouth daily. 05/24/19  Yes Clapacs, Madie Reno, MD  losartan (COZAAR) 25 MG tablet Take 0.5 tablets (12.5 mg total) by mouth daily. 05/01/22  Yes Fritzi Mandes, MD  metoprolol succinate (TOPROL-XL) 25 MG 24 hr tablet Take 0.5 tablets (12.5 mg total) by mouth at bedtime. 04/30/22  Yes Fritzi Mandes, MD  Multiple Vitamin (MULTIVITAMIN WITH MINERALS) TABS tablet Take 1 tablet by mouth daily. 05/01/22  Yes Fritzi Mandes, MD  omeprazole (PRILOSEC) 20 MG capsule Take 20 mg by mouth daily.   Yes [provider]  QUEtiapine (SEROQUEL) 200 MG tablet Take 200 mg by mouth at bedtime.   Yes [provider]  rosuvastatin (CRESTOR) 10 MG tablet Take 1 tablet (10 mg total) by mouth daily. 05/16/22  Yes Hackney, Otila Kluver A, FNP  tiotropium (SPIRIVA) 18 MCG inhalation capsule Place 18 mcg into inhaler and inhale daily.   Yes [provider]  acetaminophen (TYLENOL) 500 MG tablet Take 500 mg by mouth every 6 (six) hours as needed for mild pain.    [provider]  albuterol (VENTOLIN HFA) 108 (90 Base) MCG/ACT inhaler Inhale 2 puffs into the lungs every 6 (six) hours as needed for wheezing. 05/24/19 05/22/22  Clapacs, Madie Reno, MD  digoxin (LANOXIN) 0.125 MG tablet Take 0.125 mg by mouth daily. Patient not taking: Reported on 05/22/2022    [provider]  hydrOXYzine (ATARAX) 10 MG tablet Take 10 mg by mouth 2 (two) times daily. 12/24/21   [provider]  predniSONE (DELTASONE) 10 MG tablet Take 10 mg by mouth daily with breakfast. Patient not taking: Reported on 05/22/2022    [provider]  traMADol (ULTRAM) 50 MG tablet Take 1 tablet (50 mg total) by mouth 2 (two) times daily as needed. 03/31/22   Annita Brod, MD    Past Medical History: Past Medical History:  Diagnosis Date   Acute pulmonary edema ([place])  2017   Acute respiratory failure with hypoxia ([place]) 2017   Bipolar 1 disorder (HCC)    CAD (coronary artery disease)    a. Prior RCA stenting; b. 2017 s/p CABG x 2 (LIMA->D1, VG->OM2); b. 04/2022 Demand Isch/Cath: LM mild dzs, LAD 34m, D1 100, LCX mod diff dzs, OM2 100, OM3 50, RCA 40/20p ISR, 85d (iFR 0.99), 50d, RPAV 60, LIMA->D1 small, patent, VG->OM2 large, mild dzs-->Med Rx.   Chronic HFrEF (heart failure with reduced ejection fraction) ([place])    a. 02/2022 Echo: EF 25-30%, mild conc LVH, nl RV fxn, sev dil LA, mildly dil RA, mild MR/AI.   CKD (chronic kidney disease), stage III (HCC)    COPD (chronic obstructive pulmonary disease) ([place])    Depression    Hypertension    Ischemic cardiomyopathy    a. 04/2020 s/p MDT MRI compatible CRT-D (Ser # [id number]); b. 02/2022 Echo: EF 25-30%.   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   Seizures ([place])    childhood   Stroke [facility])    Tobacco abuse     Past Surgical History: Past Surgical History:  Procedure Laterality Date   BIV UPGRADE N/A 05/05/2020   Procedure: BIV ICD UPGRADE;  Surgeon: Deboraha Sprang, MD;  Location: [place] [facility];  Service: Cardiovascular;  Laterality: N/A;   CORONARY ARTERY BYPASS  GRAFT     INTRAVASCULAR PRESSURE WIRE/FFR STUDY N/A 04/25/2022   Procedure: INTRAVASCULAR PRESSURE WIRE/FFR STUDY;  Surgeon: Nelva Bush, MD;  Location: [place] [facility];  Service: Cardiovascular;  Laterality: N/A;   RIGHT/LEFT HEART CATH AND CORONARY/GRAFT ANGIOGRAPHY N/A 04/25/2022   Procedure: RIGHT/LEFT HEART CATH AND CORONARY/GRAFT ANGIOGRAPHY;  Surgeon: Nelva Bush, MD;  Location: [place] [facility];  Service: Cardiovascular;  Laterality: N/A;    Family History: Family History  Problem Relation Age of Onset   Cancer Mother        "femal cancer"   Prostate cancer Father     Social History: Social History   Socioeconomic History   Marital status: Widowed    Spouse name: Not on file   Number of children: Not on file   Years of education: Not on file   Highest education level: Not on file  Occupational History   Not on file  Tobacco Use   Smoking status: Every Day    Packs/day: 0.25    Years: 0.00    Additional pack years: 0.00    Total pack years: 0.00    Types: Cigarettes   Smokeless tobacco: Never  Vaping Use   Vaping Use: Never used  Substance and Sexual Activity   Alcohol use: Not Currently   Drug use: Never   Sexual activity: Not Currently    Birth control/protection: Abstinence  Other Topics Concern   Not on file  Social History Narrative   Not on file   Social Determinants of Health   Financial Resource Strain: Not on file  Food Insecurity: No Food Insecurity (09/20/2022)   Hunger Vital Sign    Worried About Running Out of Food in the Last Year: Never true    Ran Out of Food in the Last Year: Never true  Transportation Needs: No Transportation Needs (09/20/2022)   PRAPARE - Hydrologist (Medical): No    Lack of Transportation (Non-Medical): No  Physical Activity: Not on file  Stress: Not on file  Social  Connections: Not on file    Allergies:  Allergies  Allergen Reactions   Iodinated Contrast Media  Shortness Of Breath   Penicillins Anaphylaxis and Shortness Of Breath    Respiratory  Tolerated cefuroxime on 01/06/16   Strawberry Extract Anaphylaxis   Cefepime Itching    Empiric antibiotic, developed pruritis.    Erythromycin Itching   Sulfa Antibiotics Itching, Nausea And Vomiting and Nausea Only    Objective:    Vital Signs:   Temp:  [97.5 F (36.4 C)-98.6 F (37 C)] 97.5 F (36.4 C) (03/15 0812) Pulse Rate:  [58-68] 59 (03/15 0812) Resp:  [12-27] 12 (03/15 0813) BP: (94-113)/(46-86) 94/58 (03/15 0812) SpO2:  [90 %-99 %] 99 % (03/15 0812) Weight:  [82.5 kg-95.8 kg] 95.8 kg (03/15 0816) Last BM Date : 09/19/22  Weight change: Filed Weights   09/19/22 1844 09/20/22 0014 09/20/22 0816  Weight: 82.5 kg 83 kg 95.8 kg    Intake/Output:   Intake/Output Summary (Last 24 hours) at 09/20/2022 0934 Last data filed at 09/20/2022 0600 Gross per 24 hour  Intake 240 ml  Output 600 ml  Net -360 ml      Physical Exam    General:  Chronically ill appearing. No resp difficulty Neck: supple. JVP to jaw  Carotids 2+ bilat; no bruits. No lymphadenopathy or thryomegaly appreciated. Cor: PMI nondisplaced. Regular rate & rhythm. No rubs, gallops or murmurs. Lungs: coarse diffusely Abdomen: soft, nontender, nondistended. No hepatosplenomegaly. No bruits or masses. Good bowel sounds. Extremities: no cyanosis, clubbing, rash, 1+ edema Neuro: alert & orientedx3, cranial nerves grossly intact. moves all 4 extremities w/o difficulty. Affect pleasant  EKG    Sinus BiV pacing 67 Personally reviewed  Labs   Basic Metabolic Panel: Recent Labs  Lab 09/17/22 1043 09/19/22 1848  NA 132* 134*  K 4.9 3.5  CL 99 100  CO2 24 24  GLUCOSE 75 107*  BUN 25* 21  CREATININE 2.23* 2.00*  CALCIUM 9.2 8.6*    Liver Function Tests: Recent Labs  Lab 09/19/22 1848  AST 30  ALT 19  ALKPHOS 68  BILITOT 0.8  PROT 7.1  ALBUMIN 3.4*   No results for input(s): "LIPASE", "AMYLASE" in the  last 168 hours. No results for input(s): "AMMONIA" in the last 168 hours.  CBC: Recent Labs  Lab 09/17/22 1043 09/19/22 1848  WBC 7.7 7.8  NEUTROABS 5.3 5.4  HGB 13.1 11.9*  HCT 39.3 37.0*  MCV 90.8 92.5  PLT 208 162    Cardiac Enzymes: No results for input(s): "CKTOTAL", "CKMB", "CKMBINDEX", "TROPONINI" in the last 168 hours.  BNP: BNP (last 3 results) Recent Labs    08/06/22 0123 08/07/22 0115 09/19/22 1848  BNP 1,023.0* 846.9* 1,582.0*    ProBNP (last 3 results) No results for input(s): "PROBNP" in the last 8760 hours.   CBG: Recent Labs  Lab 09/20/22 0152 09/20/22 0811  GLUCAP 124* 112*    Coagulation Studies: No results for input(s): "LABPROT", "INR" in the last 72 hours.   Imaging   DG Chest Portable 1 View  Result Date: 09/19/2022 CLINICAL DATA:  Pacemaker discharged EXAM: PORTABLE CHEST 1 VIEW COMPARISON:  08/05/2022 FINDINGS: Stable cardiomegaly.  Aortic atherosclerotic calcification. Left chest wall CRT-D. Sternotomy. Cardiac valve prosthesis. Postoperative change of clavicle. Pulmonary vascular congestion. No focal consolidation, pleural effusion, or pneumothorax. IMPRESSION: Cardiomegaly and pulmonary vascular congestion. No change from 08/05/2022. Electronically Signed   By: Placido Sou M.D.   On: 09/19/2022 19:59     Medications:  Current Medications:  amiodarone  400 mg Oral Daily   apixaban  5 mg Oral BID   divalproex  500 mg Oral BID   escitalopram  10 mg Oral QHS   ezetimibe  10 mg Oral Daily   furosemide  40 mg Intravenous Once   furosemide  80 mg Intravenous BID   insulin aspart  0-5 Units Subcutaneous QHS   insulin aspart  0-9 Units Subcutaneous TID WC   levETIRAcetam  250 mg Oral BID   levothyroxine  50 mcg Oral Q0600   midodrine  2.5 mg Oral TID with meals   nicotine  14 mg Transdermal Daily   QUEtiapine  200 mg Oral QHS   rosuvastatin  10 mg Oral QHS   sodium chloride flush  3 mL Intravenous Q12H     Infusions:    Assessment/Plan     1.  VT/VF arrest with ICD shock: - being managed by EP and gen cards - continue amio and mexilitene - - Kep K > 4.0 Mg > 2.0  2.  Acute on  Chronic heart failure with reduced ejection fraction/ischemic cardiomyopathy:  - EF 25 to 30% by echo in August 2023.   - Repeat echo done today 09/20/22 EF 15-20% - Optivol reading shows fluid is up and down a lot (in setting of high volume soda intake at SNF). Currently optivol looks ok but he is clearly volume overloaded on exam and by ReDS 46% (normal 20-35% lung water) - BP soft but currently does not appear low output - Increase lasix to 80 IV bid. Place TED hose. Will add midodrine 2.5 tid for BP support. If not diuresing will need short course of inotrope support  - He is nearing end-stage but currently not candidate for advanced therapies with tobacco use, immobility and poor insight -Off GDMT due to low BP - Consider Palliative involvement  3.  Coronary artery disease/demand ischemia: Status post recent catheterization October 2023 revealing 2 of 2 patent grafts with moderate RCA in-stent restenosis and distal bifurcation disease with normal iFR.  Patient denies experiencing chest pain recently.  - no evidence ACS hstrop 62 -> 64 - continue medical management   4.  Paroxysmal atrial fibrillation: Maintaining sinus rhythm.  Anticoagulated with Eliquis.  Now on Amio in the setting of VT/VF  5.  Ongoing tobacco abuse/COPD: Cessation advised but he is not interested   6.  Bipolar disorder: Per medicine team.  7. CKD 3b - SCr at baseline 1.9-2.0 - ideally would use SGLT2i as tolerated  Length of Stay: 0  Glori Bickers, MD  09/20/2022, 9:34 AM  Advanced Heart Failure Team Pager [phone] (M-F; 7a - 5p)  Please contact [facility] for night-coverage after hours (4p -7a ) and weekends on [email or web address]

## 2022-09-20 NOTE — Assessment & Plan Note (Addendum)
Patient is currently on Eliquis and crestor

## 2022-09-20 NOTE — Assessment & Plan Note (Addendum)
Fall precautions - likely multifactorial PT, OT eval

## 2022-09-20 NOTE — Assessment & Plan Note (Addendum)
Device interrogated - recurrent VT Continue amio & mexiletine

## 2022-09-20 NOTE — Evaluation (Addendum)
Occupational Therapy Evaluation Patient Details Name: Carl [name] MRN: [id number] DOB: 1960/12/05 Today's Date: 09/20/2022   History of Present Illness Carl [name] is a 36yoM who comes to [facility] on March 13 from his LTC facility c chest pain, progressive dyspnea. Pt went into vtach spontaneously and received shocks from his AICD. PMH: BPD, CAD, HF, CKD3, COPD, HTN, seizures, stroke.   Clinical Impression   Carl [name] was seen for OT evaluation this date. Per pt/chart pt is MOD I with RW at baseline. He lives in an ALF with staff who assist with IADL management and ADL management as needed. Pt received semi-supine in bed, denies pain or discomfort at this time. VSS on RA. SpO2 WFL t/o session. Pt endorses feeling at or near his functional baseline level of independence. He performs bed/functional mobility with MOD I, min cueing for safety when performing functional mobility in room. He is able to perform simulated LB dressing task with good LB access and no physical assist from this author. Pt educated on safety/falls prevention for home and hospital. No further skilled OT needs identified. All education and assessment completed at this time. Will sign off in house. Do not anticipate the need for follow up OT services upon hospital DC.     Recommendations for follow up therapy are one component of a multi-disciplinary discharge planning process, led by the attending physician.  Recommendations may be updated based on patient status, additional functional criteria and insurance authorization.   Follow Up Recommendations  No OT follow up     Assistance Recommended at Discharge Frequent or constant Supervision/Assistance  Patient can return home with the following A little help with walking and/or transfers;A little help with bathing/dressing/bathroom    Functional Status Assessment  Patient has not had a recent decline in their functional status  Equipment Recommendations  None recommended by  OT    Recommendations for Other Services       Precautions / Restrictions Precautions Precautions: Fall Restrictions Weight Bearing Restrictions: No      Mobility Bed Mobility Overal bed mobility: Needs Assistance Bed Mobility: Supine to Sit, Sit to Supine     Supine to sit: HOB elevated, Modified independent (Device/Increase time) Sit to supine: Modified independent (Device/Increase time), HOB elevated        Transfers Overall transfer level: Modified independent   Transfers: Sit to/from Stand Sit to Stand: Modified independent (Device/Increase time)           General transfer comment: Able to perform STS from bed in lowest position with no physical assist.      Balance Overall balance assessment: Needs assistance, Mild deficits observed, not formally tested Sitting-balance support: Feet supported, No upper extremity supported Sitting balance-Leahy Scale: Good Sitting balance - Comments: fatigues quickly but is steady with static/dynamic sitting tasks without UE support.   Standing balance support: During functional activity, Single extremity supported Standing balance-Leahy Scale: Good Standing balance comment: 1 HH assist for STS at EOB and to take side steps to EOB. Pt declines further mobility.                           ADL either performed or assessed with clinical judgement   ADL Overall ADL's : At baseline                                       General  ADL Comments: PT presents at or near his functional baseline. He is able to perform bed/functional mobility with MOD I. Completes simulated LB dressing task with functional LB access and does not need physical assist to doff/don bilat socks. He denies concerns for functional independence upon return home and endorses having "roommates" who can assist him PRN.     Vision Patient Visual Report: No change from baseline       Perception     Praxis      Pertinent Vitals/Pain  Pain Assessment Pain Assessment: No/denies pain     Hand Dominance Right   Extremity/Trunk Assessment Upper Extremity Assessment Upper Extremity Assessment: Overall WFL for tasks assessed   Lower Extremity Assessment Lower Extremity Assessment: Overall WFL for tasks assessed       Communication Communication Communication: Expressive difficulties;Other (comment)   Cognition Arousal/Alertness: Awake/alert Behavior During Therapy: WFL for tasks assessed/performed Overall Cognitive Status: Within Functional Limits for tasks assessed                                 General Comments: Able to follow VCs consistently, difficult to determine baseline with no caregivers present and baseline expressive communication difficulties but is oriented to self, place, and situation.     General Comments       Exercises Other Exercises Other Exercises: Pt educated on role of OT in acute setting, safety/falls prevention strategies for home and hospital, and routines modifications to support safety and functional independence during ADL management.   Shoulder Instructions      Home Living Family/patient expects to be discharged to:: Assisted living                             Home Equipment: Rolling Walker (2 wheels);Wheelchair - manual   Additional Comments: Pt poor historian, but reports he has help from "roommates" for ADL management at his ALF.      Prior Functioning/Environment Prior Level of Function : Patient poor historian/Family not available             Mobility Comments: wheelchair and RW ADLs Comments: Reports mod-I with self care tasks.        OT Problem List: Decreased coordination;Decreased safety awareness;Impaired balance (sitting and/or standing);Decreased knowledge of use of DME or AE      OT Treatment/Interventions:      OT Goals(Current goals can be found in the care plan section) Acute Rehab OT Goals Patient Stated Goal: To go  home OT Goal Formulation: All assessment and education complete, DC therapy Time For Goal Achievement: 09/20/22 Potential to Achieve Goals: Good  OT Frequency:      Co-evaluation              AM-PAC OT "6 Clicks" Daily Activity     Outcome Measure Help from another person eating meals?: None Help from another person taking care of personal grooming?: None Help from another person toileting, which includes using toliet, bedpan, or urinal?: None Help from another person bathing (including washing, rinsing, drying)?: A Little Help from another person to put on and taking off regular upper body clothing?: None Help from another person to put on and taking off regular lower body clothing?: A Little 6 Click Score: 22   End of Session Equipment Utilized During Treatment: Gait belt;Rolling walker (2 wheels) Nurse Communication: Mobility status;Other (comment) (Pt would benefit from [facility] external catheter.)  Activity Tolerance: Patient tolerated treatment well Patient left: in bed;with call bell/phone within reach;with bed alarm set  OT Visit Diagnosis: Other abnormalities of gait and mobility (R26.89)                Time: [id number] OT Time Calculation (min): 14 min Charges:  OT General Charges $OT Visit: 1 Visit OT Evaluation $OT Eval Low Complexity: 1 Low  Shara Blazing, M.S., OTR/L 09/20/22, 1:20 PM

## 2022-09-20 NOTE — Assessment & Plan Note (Addendum)
Keep Mg > 2 and K > 4

## 2022-09-20 NOTE — Progress Notes (Signed)
Progress Note   Patient: Carl [name] [id number] DOB: 1960-11-09 DOA: 09/19/2022     0 DOS: the patient was seen and examined on 09/20/2022   Brief hospital course:  62 y.o. male with a history of CAD s/p CABG, ICM, HFrEF, VT/VF s/p CRT-D, HTN, HL, PAF, tob abuse, COPD, CKD II-III, stroke, and bipolar disorder admitted after ICD discharge   3/15: cardio c/s, Palliative care c/s, echo  Assessment and Plan: * AICD discharge Device interrogated - recurrent VT Continue amio & mexiletine   Ventricular tachycardia (HCC) Medtronic device interrogation done Recurrent VT likely in the setting of CHF exacerbation Continue amiodarone and mexiletine Monitor on tele Cardio team following EF < 20% on echo this admission     Demand ischemia Mild troponin elevation due to demand ischemia.   Chest pain Currently chest pain free. PRN NTG.  Monitor on tele  Acute on chronic combined systolic and diastolic CHF (congestive heart failure) (HCC) EF < 20%, severe global hypokinesis on echo this admission (previous echo showed 25-30%) Continue IV lasix 80 BID and Midodrine  Net IO Since Admission: -1,790 mL [09/20/22 1911]  IV Lasix 80 twice daily, midodrine 2.5 3 times daily Advance CHF team c/sed - appreciate dr bensimhon input. Check abd [place] to r/o ascites as abd is distended    Electrolyte abnormality Keep Mg > 2 and K > 4   Essential hypertension Holding meds as BP is soft.   Hypothyroidism Continue patient's home regimen of levothyroxine at 50 mcg for now Patient's thyroid function 10 days ago was abnormal with a TSH of 14.422.  Will repeat TSH and free T4 to further titrate his levothyroxine dose  Falls Fall precautions - likely multifactorial PT, OT eval  History of alcohol abuse Unclear history of alcohol abuse. Fall precautions.  CIWA if needed. Will verify with history.  History of stroke Patient is currently on Eliquis and crestor   Type 2 diabetes mellitus  with hyperglycemia ([place]) SSI for now  Paroxysmal atrial fibrillation (HCC) continue amiodarone for rate control and Eliquis for anticoagulation    CAD in native artery Continue eliquis, crestor.   Seizure disorder [facility]) Seizure precautions - continue patient on Keppra and Depakote.  Tobacco use Nicotine patch.   Abdominal aortic aneurysm (AAA) 35 to 39 mm in diameter (HCC) 3.1 cm infrarenal abdominal aortic aneurysm.  Monitoring for now        Subjective: sob and   Physical Exam: Vitals:   09/20/22 0816 09/20/22 1043 09/20/22 1203 09/20/22 1640  BP:   103/68 106/70  Pulse:   68 72  Resp:  18 (!) 21 (!) 25  Temp:   98 F (36.7 C) 97.8 F (36.6 C)  TempSrc:   Oral Oral  SpO2:   94% 95%  Weight: 95.8 kg     Height:       62 year old male lying in the bed looking chronically ill Lungs Rales in the bases Neck: JVP elevated Heart: Regular rate and rhythm Abdomen: Soft, distended, hypoactive bowel sounds Skin no rash or lesion Neuro alert and awake, nonfocal  Data Reviewed:  Creatinine 2.0, echo shows EF less than 20%  Family Communication: None at bedside.  He has appointed legal guardian  Disposition: Status is: Inpatient Remains inpatient appropriate because: Management of CHF exacerbation  Planned Discharge Destination:  [name] with palliative care/hospice   DVT prophylaxis-Eliquis Time spent: 35 minutes  Author: Max Sane, MD 09/20/2022 7:15 PM  For on call review [email or web address].

## 2022-09-20 NOTE — Assessment & Plan Note (Addendum)
SSI for now 

## 2022-09-20 NOTE — Assessment & Plan Note (Addendum)
Medtronic device interrogation done Recurrent VT likely in the setting of CHF exacerbation Continue amiodarone and mexiletine Monitor on tele Cardio team following EF < 20% on echo this admission

## 2022-09-20 NOTE — Evaluation (Signed)
Physical Therapy Evaluation Patient Details Name: Carl [name] MRN: [id number] DOB: [birth year] Today's Date: 09/20/2022  History of Present Illness  Carl [name] is a 1yoM who comes to [facility] on March 13 from his LTC facility c chest pain, progressive dyspnea. Pt went into vtach spontaneously and received shocks from his AICD. PMH: BPD, CAD, HF, CKD3, COPD, HTN, seizures, stroke.  Clinical Impression  Pt in bed, awake, agreeable to session. Pt reports to feel fine today. Pt able to show all mobility at supervision level, no frank difficulty. Secretary/administrator of 3 lines while mobilizing. Pt AMB 248ft in hallway with RW on room air, VSS, rates <100bpm t/o, SpO2 94-96% post AMB. Pt denies any acute loss in strength, balance, or mobility tolerance. Anticipate easy transition back to his facility at DC.     Recommendations for follow up therapy are one component of a multi-disciplinary discharge planning process, led by the attending physician.  Recommendations may be updated based on patient status, additional functional criteria and insurance authorization.  Follow Up Recommendations No PT follow up      Assistance Recommended at Discharge Intermittent Supervision/Assistance  Patient can return home with the following  A little help with walking and/or transfers;A little help with bathing/dressing/bathroom;Help with stairs or ramp for entrance;Assist for transportation;Direct supervision/assist for financial management    Equipment Recommendations None recommended by PT  Recommendations for Other Services       Functional Status Assessment Patient has not had a recent decline in their functional status     Precautions / Restrictions Restrictions Weight Bearing Restrictions: No      Mobility  Bed Mobility Overal bed mobility: Needs Assistance Bed Mobility: Supine to Sit, Sit to Supine     Supine to sit: Supervision Sit to supine: Supervision   General bed mobility comments:  moving well, but unclear awareness of safety for lines/leads    Transfers Overall transfer level: Needs assistance Equipment used: Rolling walker (2 wheels) Transfers: Sit to/from Stand Sit to Stand: Supervision                Ambulation/Gait Ambulation/Gait assistance: Supervision Gait Distance (Feet): 260 Feet Assistive device: Rolling walker (2 wheels) Gait Pattern/deviations: Step-through pattern Gait velocity: 0.33m/s        Stairs            Wheelchair Mobility    Modified Rankin (Stroke Patients Only)       Balance Overall balance assessment: Needs assistance, Mild deficits observed, not formally tested                                           Pertinent Vitals/Pain Pain Assessment Pain Assessment: No/denies pain    Home Living Family/patient expects to be discharged to:: Assisted living                 Home Equipment: Conservation officer, nature (2 wheels);Wheelchair - manual Additional Comments: pt is impulsive and poor historian    Prior Function Prior Level of Function : Patient poor historian/Family not available             Mobility Comments: wheelchair and RW ADLs Comments: Reports mod-I with self care tasks.     Hand Dominance   Dominant Hand: Right    Extremity/Trunk Assessment                Communication   Communication: Expressive  difficulties;Other (comment)  Cognition Arousal/Alertness: Awake/alert Behavior During Therapy: WFL for tasks assessed/performed Overall Cognitive Status: Within Functional Limits for tasks assessed                                          General Comments      Exercises     Assessment/Plan    PT Assessment Patient needs continued PT services  PT Problem List Decreased knowledge of use of DME;Decreased activity tolerance;Decreased range of motion;Decreased balance       PT Treatment Interventions DME instruction;Functional mobility training     PT Goals (Current goals can be found in the Care Plan section)  Acute Rehab PT Goals Patient Stated Goal: remain mobile and strong while admitted PT Goal Formulation: With patient Time For Goal Achievement: 10/04/22 Potential to Achieve Goals: Good    Frequency Min 2X/week     Co-evaluation               AM-PAC PT "6 Clicks" Mobility  Outcome Measure Help needed turning from your back to your side while in a flat bed without using bedrails?: A Little Help needed moving from lying on your back to sitting on the side of a flat bed without using bedrails?: A Little Help needed moving to and from a bed to a chair (including a wheelchair)?: A Little Help needed standing up from a chair using your arms (e.g., wheelchair or bedside chair)?: A Little Help needed to walk in hospital room?: A Little Help needed climbing 3-5 steps with a railing? : A Little 6 Click Score: 18    End of Session Equipment Utilized During Treatment: Gait belt Activity Tolerance: Patient tolerated treatment well;No increased pain Patient left: in bed;with call bell/phone within reach;with bed alarm set Nurse Communication: Mobility status (O2 needs) PT Visit Diagnosis: Other abnormalities of gait and mobility (R26.89)    Time: [id number] PT Time Calculation (min) (ACUTE ONLY): 14 min   Charges:   PT Evaluation $PT Eval Moderate Complexity: 1 Mod         12:49 PM, 09/20/22 Etta Grandchild, PT, DPT Physical Therapist - [facility]  [phone] ([place])    [place] C 09/20/2022, 12:47 PM

## 2022-09-21 DIAGNOSIS — J9601 Acute respiratory failure with hypoxia: Secondary | ICD-10-CM

## 2022-09-21 DIAGNOSIS — J810 Acute pulmonary edema: Secondary | ICD-10-CM

## 2022-09-21 DIAGNOSIS — I2089 Other forms of angina pectoris: Secondary | ICD-10-CM

## 2022-09-21 DIAGNOSIS — Z4502 Encounter for adjustment and management of automatic implantable cardiac defibrillator: Secondary | ICD-10-CM | POA: Diagnosis not present

## 2022-09-21 DIAGNOSIS — Z7189 Other specified counseling: Secondary | ICD-10-CM

## 2022-09-21 DIAGNOSIS — I472 Ventricular tachycardia, unspecified: Secondary | ICD-10-CM | POA: Diagnosis not present

## 2022-09-21 DIAGNOSIS — I480 Paroxysmal atrial fibrillation: Secondary | ICD-10-CM | POA: Diagnosis not present

## 2022-09-21 DIAGNOSIS — I5043 Acute on chronic combined systolic (congestive) and diastolic (congestive) heart failure: Secondary | ICD-10-CM | POA: Diagnosis not present

## 2022-09-21 DIAGNOSIS — Z720 Tobacco use: Secondary | ICD-10-CM | POA: Diagnosis not present

## 2022-09-21 LAB — CBC
HCT: 37.1 % — ABNORMAL LOW (ref 39.0–52.0)
Hemoglobin: 12.3 g/dL — ABNORMAL LOW (ref 13.0–17.0)
MCH: 30.1 pg (ref 26.0–34.0)
MCHC: 33.2 g/dL (ref 30.0–36.0)
MCV: 90.7 fL (ref 80.0–100.0)
Platelets: 171 10*3/uL (ref 150–400)
RBC: 4.09 MIL/uL — ABNORMAL LOW (ref 4.22–5.81)
RDW: 16.5 % — ABNORMAL HIGH (ref 11.5–15.5)
WBC: 8.6 10*3/uL (ref 4.0–10.5)
nRBC: 0 % (ref 0.0–0.2)

## 2022-09-21 LAB — BASIC METABOLIC PANEL
Anion gap: 9 (ref 5–15)
BUN: 23 mg/dL (ref 8–23)
CO2: 27 mmol/L (ref 22–32)
Calcium: 8.8 mg/dL — ABNORMAL LOW (ref 8.9–10.3)
Chloride: 98 mmol/L (ref 98–111)
Creatinine, Ser: 1.68 mg/dL — ABNORMAL HIGH (ref 0.61–1.24)
GFR, Estimated: 45 mL/min — ABNORMAL LOW (ref >60)
Glucose, Bld: 97 mg/dL (ref 70–99)
Potassium: 3.2 mmol/L — ABNORMAL LOW (ref 3.5–5.1)
Sodium: 134 mmol/L — ABNORMAL LOW (ref 135–145)

## 2022-09-21 LAB — GLUCOSE, CAPILLARY
Glucose-Capillary: 102 mg/dL — ABNORMAL HIGH (ref 70–99)
Glucose-Capillary: 130 mg/dL — ABNORMAL HIGH (ref 70–99)
Glucose-Capillary: 123 mg/dL — ABNORMAL HIGH (ref 70–99)
Glucose-Capillary: 124 mg/dL — ABNORMAL HIGH (ref 70–99)

## 2022-09-21 MED ORDER — POTASSIUM CHLORIDE CRYS ER 20 MEQ PO TBCR
40.0000 meq | EXTENDED_RELEASE_TABLET | Freq: Once | ORAL | Status: AC
  Administered 2022-09-21: 40 meq via ORAL
  Filled 2022-09-21: qty 2

## 2022-09-21 NOTE — Assessment & Plan Note (Signed)
Device interrogated - recurrent VT Continue amio & mexiletine per cardiology

## 2022-09-21 NOTE — Assessment & Plan Note (Signed)
Seizure precautions - continue patient on Keppra and Depakote.

## 2022-09-21 NOTE — Assessment & Plan Note (Signed)
EF < 20%, severe global hypokinesis on echo this admission (previous echo showed 25-30%) Continue IV lasix 80 BID and Midodrine  Net IO Since Admission: -3,082 mL [09/21/22 1215]  Advance CHF team c/sed - appreciate dr bensimhon input. Abdominal ultrasound did not show any ascites

## 2022-09-21 NOTE — IPAL (Signed)
  Interdisciplinary Goals of Care Family Meeting   Date carried out: 09/21/2022  Location of the meeting: Phone conference  Member's involved: Physician and Family Member or next of kin  [place] or acting medical decision maker: Buyer, retail  Discussion: We discussed goals of care for [place] status:   Code Status: DNR   Disposition: Home with Hospice versus hospice home  Time spent for the meeting: 35 minutes    Max Sane, MD  09/21/2022, 12:07 PM

## 2022-09-21 NOTE — Assessment & Plan Note (Signed)
Fall precautions - likely multifactorial No skilled PT/OT need

## 2022-09-21 NOTE — Assessment & Plan Note (Signed)
Continue eliquis, crestor.

## 2022-09-21 NOTE — Assessment & Plan Note (Signed)
Currently chest pain free. PRN NTG.  Monitor on tele

## 2022-09-21 NOTE — Assessment & Plan Note (Addendum)
Medtronic device interrogation done Recurrent VT likely in the setting of CHF exacerbation Continue amiodarone and mexiletine for now Monitor on tele Cardio team following EF < 20% on echo this admission Overall poor prognosis

## 2022-09-21 NOTE — Assessment & Plan Note (Signed)
Unclear history of alcohol abuse. Fall precautions.  CIWA if needed.

## 2022-09-21 NOTE — Assessment & Plan Note (Signed)
Continue Eliquis and crestor

## 2022-09-21 NOTE — Assessment & Plan Note (Signed)
Keep Mg > 2 and K > 4 Replaced potassium today

## 2022-09-21 NOTE — Progress Notes (Signed)
[facility])  Hospital Liaison RN note  Received request from TOC/MD for hospice services at care home after discharge. Chart and patient information under review by Hospice physician.   Spoke with guardian Carl [name] to initiate education related to hospice philosophy, services, and team approach to care and she verbalized understanding of information given.   Per discussion, the plan is for discharge is undetermined at this time.  DME needs discussed. There are no immediate needs.  Please send signed and completed DNR with patient/family. Please provide prescriptions at discharge as needed for ongoing symptom management.   [facility] information and contact numbers given to Carl [name]. Above information shared with [facility].  Please call with any hospice related questions or concerns.  Thank you for the opportunity to participate in this patient's care.  Carl [name], Therapist, sports, BSN [employer] [phone]

## 2022-09-21 NOTE — Assessment & Plan Note (Signed)
3.1 cm infrarenal abdominal aortic aneurysm.  Monitoring for now

## 2022-09-21 NOTE — Progress Notes (Signed)
Progress Note   Patient: Carl [name] [id number] DOB: 1961/03/09 DOA: 09/19/2022     1 DOS: the patient was seen and examined on 09/21/2022   Brief hospital course:  62 y.o. male with a history of CAD s/p CABG, ICM, HFrEF, VT/VF s/p CRT-D, HTN, HL, PAF, tob abuse, COPD, CKD II-III, stroke, and bipolar disorder admitted after ICD discharge   3/15: cardio c/s, Palliative care c/s, echo 3/16: Legal guardian interested in hospice evaluation.  Referral sent  Assessment and Plan: * AICD discharge Device interrogated - recurrent VT Continue amio & mexiletine per cardiology   Ventricular tachycardia ([place]) Medtronic device interrogation done Recurrent VT likely in the setting of CHF exacerbation Continue amiodarone and mexiletine for now Monitor on tele Cardio team following EF < 20% on echo this admission Overall poor prognosis     Demand ischemia Mild troponin elevation due to demand ischemia.   Chest pain Currently chest pain free. PRN NTG.  Monitor on tele  Acute on chronic combined systolic and diastolic CHF (congestive heart failure) (HCC) EF < 20%, severe global hypokinesis on echo this admission (previous echo showed 25-30%) Continue IV lasix 80 BID and Midodrine  Net IO Since Admission: -3,082 mL [09/21/22 1215]  Advance CHF team c/sed - appreciate dr bensimhon input. Abdominal ultrasound did not show any ascites   Electrolyte abnormality Keep Mg > 2 and K > 4 Replaced potassium today  Essential hypertension Holding meds as BP is soft   Hypothyroidism Continue patient's home regimen of levothyroxine at 50 mcg for now Patient's thyroid function 10 days ago was abnormal with a TSH of 14.422.  Will repeat TSH and free T4 to further titrate his levothyroxine dose.  Falls Fall precautions - likely multifactorial No skilled PT/OT need  History of alcohol abuse Unclear history of alcohol abuse. Fall precautions.  CIWA if needed.   History of  stroke Continue Eliquis and crestor   Type 2 diabetes mellitus with hyperglycemia ([place]) SSI for now.  Paroxysmal atrial fibrillation (HCC) continue amiodarone for rate control and Eliquis for anticoagulation.  Cardiology following.  Monitor on telemetry    CAD in native artery Continue eliquis, crestor.   Goals of care, counseling/discussion Legal guardian agreeable and interested in hospice evaluation  Seizure disorder [facility]) Seizure precautions - continue patient on Keppra and Depakote.  Tobacco use Nicotine patch.   Abdominal aortic aneurysm (AAA) 35 to 39 mm in diameter (HCC) 3.1 cm infrarenal abdominal aortic aneurysm.  Monitoring for now        Subjective: No new issues  Physical Exam: Vitals:   09/21/22 0500 09/21/22 0638 09/21/22 0757 09/21/22 1136  BP:  (!) 93/48 107/65 (!) 104/56  Pulse:  62 68 72  Resp:   20 20  Temp:   98.1 F (36.7 C) 98 F (36.7 C)  TempSrc:   Oral Oral  SpO2:  100% 96% 94%  Weight: 95.8 kg     Height:       62 year old male lying in the bed looking chronically ill Lungs Rales in the bases Neck: JVP elevated Heart: Regular rate and rhythm Abdomen: Soft, distended, hypoactive bowel sounds Skin no rash or lesion Neuro alert and awake, nonfocal Data Reviewed:  K3.2  Family Communication: Updated legal guardian [name] over phone  Disposition: Status is: Inpatient Remains inpatient appropriate because: Management of arrhythmia and CHF exacerbation  Planned Discharge Destination:  [facility] with hospice versus hospice home   DVT prophylaxis-Eliquis Time spent: 35 minutes  Author: Max Sane,  MD 09/21/2022 12:16 PM  For on call review [email or web address].

## 2022-09-21 NOTE — Assessment & Plan Note (Signed)
continue amiodarone for rate control and Eliquis for anticoagulation.  Cardiology following.  Monitor on telemetry

## 2022-09-21 NOTE — Assessment & Plan Note (Signed)
Mild troponin elevation due to demand ischemia.

## 2022-09-21 NOTE — Assessment & Plan Note (Signed)
SSI for now 

## 2022-09-21 NOTE — Assessment & Plan Note (Signed)
-  Nicotine patch 

## 2022-09-21 NOTE — Assessment & Plan Note (Signed)
Holding meds as BP is soft

## 2022-09-21 NOTE — Assessment & Plan Note (Signed)
Legal guardian agreeable and interested in hospice evaluation

## 2022-09-21 NOTE — Progress Notes (Signed)
Rounding Note    Patient Name: Carl [name] Date of Encounter: 09/21/2022  [place] Cardiologist: Kathlyn [place], MD   Subjective   UOP -3.5L. kidney function improving. Pressures soft overnight. NO chest pain.   Inpatient Medications    Scheduled Meds:  amiodarone  400 mg Oral BID   apixaban  5 mg Oral BID   divalproex  500 mg Oral BID   escitalopram  10 mg Oral QHS   ezetimibe  10 mg Oral Daily   feeding supplement  237 mL Oral BID BM   furosemide  80 mg Intravenous BID   insulin aspart  0-5 Units Subcutaneous QHS   insulin aspart  0-9 Units Subcutaneous TID WC   levETIRAcetam  250 mg Oral BID   levothyroxine  50 mcg Oral Q0600   midodrine  2.5 mg Oral TID with meals   multivitamin with minerals  1 tablet Oral Daily   nicotine  14 mg Transdermal Daily   QUEtiapine  200 mg Oral QHS   rosuvastatin  10 mg Oral QHS   sodium chloride flush  3 mL Intravenous Q12H   Continuous Infusions:  PRN Meds: acetaminophen **OR** acetaminophen, albuterol   Vital Signs    Vitals:   09/21/22 0416 09/21/22 0500 09/21/22 0638 09/21/22 0757  BP: (!) 84/46  (!) 93/48 107/65  Pulse: 64  62 68  Resp: 17   20  Temp: 97.8 F (36.6 C)   98.1 F (36.7 C)  TempSrc:    Oral  SpO2: 94%  100% 96%  Weight:  95.8 kg    Height:        Intake/Output Summary (Last 24 hours) at 09/21/2022 1059 Last data filed at 09/21/2022 0946 Gross per 24 hour  Intake 1173 ml  Output 3300 ml  Net -2127 ml      09/21/2022    5:00 AM 09/20/2022    8:16 AM 09/20/2022   12:14 AM  Last 3 Weights  Weight (lbs) 211 lb 3.2 oz 211 lb 3.2 oz 183 lb  Weight (kg) 95.8 kg 95.8 kg 83.008 kg      Telemetry     A sensed v paced, no further VT - Personally Reviewed  ECG    No new - Personally Reviewed  Physical Exam   GEN: No acute distress.   Neck: No JVD Cardiac: RRR, no murmurs, rubs, or gallops.  Respiratory: crackles GI: Soft, nontender, non-distended  MS: No edema; No  deformity. Neuro:  Nonfocal  Psych: Normal affect   Labs    High Sensitivity Troponin:   Recent Labs  Lab 09/19/22 1848 09/19/22 2042  TROPONINIHS 62* 64*     Chemistry Recent Labs  Lab 09/17/22 1043 09/19/22 1848 09/21/22 0506  NA 132* 134* 134*  K 4.9 3.5 3.2*  CL 99 100 98  CO2 24 24 27   GLUCOSE 75 107* 97  BUN 25* 21 23  CREATININE 2.23* 2.00* 1.68*  CALCIUM 9.2 8.6* 8.8*  PROT  --  7.1  --   ALBUMIN  --  3.4*  --   AST  --  30  --   ALT  --  19  --   ALKPHOS  --  68  --   BILITOT  --  0.8  --   GFRNONAA 32* 37* 45*  ANIONGAP 9 10 9     Lipids No results for input(s): "CHOL", "TRIG", "HDL", "LABVLDL", "LDLCALC", "CHOLHDL" in the last 168 hours.  Hematology Recent Labs  Lab 09/17/22  1043 09/19/22 1848 09/21/22 0506  WBC 7.7 7.8 8.6  RBC 4.33 4.00* 4.09*  HGB 13.1 11.9* 12.3*  HCT 39.3 37.0* 37.1*  MCV 90.8 92.5 90.7  MCH 30.3 29.8 30.1  MCHC 33.3 32.2 33.2  RDW 17.0* 16.7* 16.5*  PLT 208 162 171   Thyroid No results for input(s): "TSH", "FREET4" in the last 168 hours.  BNP Recent Labs  Lab 09/19/22 1848  BNP 1,582.0*    DDimer No results for input(s): "DDIMER" in the last 168 hours.   Radiology    ECHOCARDIOGRAM LIMITED  Result Date: 09/20/2022    ECHOCARDIOGRAM LIMITED REPORT   [name] Date of Exam: 09/20/2022 Medical Rec #:  [id number]    Height:       66.0 in Accession #:    [id number]   Weight:       211.2 lb Date of Birth:  06/06/1961    BSA:          2.047 m Patient Age:    62 years     BP:           Not listed in chart not listed in                                            chart/Not listed in chart mmHg Patient Gender: M            HR:           65 bpm. Exam Location:  [facility] Procedure: Cardiac Doppler, Color Doppler and Limited Echo Indications:     Ventricular tachycardia I47.2  History:         Patient has prior history of Echocardiogram examinations, most                  recent 08/04/2022. Ischemic cardiomyopathy; Risk                   Factors:Hypertension.  Sonographer:     Sherrie Sport Referring Phys:  [place] Phys: Ida Rogue MD IMPRESSIONS  1. Left ventricular ejection fraction, by estimation, is <20%. Left ventricular ejection fraction by 2D MOD biplane is 17.5 %. The left ventricle has severely decreased function. The left ventricle demonstrates global hypokinesis, anterior/anteroseptal wall best preserved. The left ventricular internal cavity size was moderately to severely dilated.  2. Right ventricular systolic function is low normal. The right ventricular size is mildly enlarged. There is mildly elevated pulmonary artery systolic pressure. The estimated right ventricular systolic pressure is Q000111Q mmHg.  3. Left atrial size was moderately dilated.  4. The mitral valve is degenerative. Moderate to severe mitral valve regurgitation. Moderate mitral annular calcification. Gradient not measured, unable to exclude mild stenosis.  5. The aortic valve has an indeterminant number of cusps. Aortic valve regurgitation is not visualized. No aortic stenosis is present. FINDINGS  Left Ventricle: Left ventricular ejection fraction, by estimation, is <20%. Left ventricular ejection fraction by 2D MOD biplane is 17.5 %. The left ventricle has severely decreased function. The left ventricle demonstrates global hypokinesis. The left ventricular internal cavity size was moderately to severely dilated. There is no left ventricular hypertrophy. Right Ventricle: The right ventricular size is mildly enlarged. No increase in right ventricular wall thickness. Right ventricular systolic function is low normal. There is mildly elevated pulmonary artery systolic pressure. The tricuspid  regurgitant velocity is 2.76 m/s, and with an assumed right atrial pressure of 10 mmHg, the estimated right ventricular systolic pressure is Q000111Q mmHg. Left Atrium: Left atrial size was moderately dilated. Right Atrium: Right atrial size was  normal in size. Pericardium: There is no evidence of pericardial effusion. Mitral Valve: The mitral valve is degenerative in appearance. There is mild calcification of the mitral valve leaflet(s). Moderate mitral annular calcification. Moderate to severe mitral valve regurgitation. Tricuspid Valve: The tricuspid valve is normal in structure. Tricuspid valve regurgitation is mild . No evidence of tricuspid stenosis. Aortic Valve: The aortic valve has an indeterminant number of cusps. Aortic valve regurgitation is not visualized. No aortic stenosis is present. Pulmonic Valve: The pulmonic valve was normal in structure. Pulmonic valve regurgitation is not visualized. No evidence of pulmonic stenosis. Aorta: The aortic root is normal in size and structure. Venous: The inferior vena cava was not well visualized. IAS/Shunts: No atrial level shunt detected by color flow Doppler. Additional Comments: A device lead is visualized. Spectral Doppler performed. Color Doppler performed.  LEFT VENTRICLE PLAX 2D                        Biplane EF (MOD) LVIDd:         7.20 cm         LV Biplane EF:   Left LVIDs:         6.20 cm                          ventricular LV PW:         0.90 cm                          ejection LV IVS:        1.20 cm                          fraction by LVOT diam:     2.20 cm                          2D MOD LVOT Area:     3.80 cm                         biplane is                                                 17.5 %.  LV Volumes (MOD) LV vol d, MOD    241.0 ml A2C: LV vol d, MOD    235.0 ml A4C: LV vol s, MOD    191.0 ml A2C: LV vol s, MOD    205.0 ml A4C: LV SV MOD A2C:   50.0 ml LV SV MOD A4C:   235.0 ml LV SV MOD BP:    42.3 ml RIGHT VENTRICLE RV Basal diam:  3.80 cm RV Mid diam:    2.10 cm RV S prime:     8.16 cm/s TAPSE (M-mode): 1.8 cm LEFT ATRIUM             Index        RIGHT ATRIUM  Index LA diam:        6.00 cm 2.93 cm/m   RA Area:     18.70 cm LA Vol (A2C):   78.7 ml 38.45 ml/m  RA  Volume:   53.60 ml  26.19 ml/m LA Vol (A4C):   88.9 ml 43.44 ml/m LA Biplane Vol: 86.0 ml 42.02 ml/m   AORTA Ao Root diam: 3.50 cm MR PISA:        18.16 cm  TRICUSPID VALVE MR PISA Radius: 1.70 cm    TR Peak grad:   30.5 mmHg                            TR Vmax:        276.00 cm/s                             SHUNTS                            Systemic Diam: 2.20 cm Ida Rogue MD Electronically signed by Ida Rogue MD Signature Date/Time: 09/20/2022/11:43:11 AM    Final (Updated)    [place] ASCITES (ABDOMEN LIMITED)  Result Date: 09/20/2022 CLINICAL DATA:  Abdominal distention. EXAM: LIMITED ABDOMEN ULTRASOUND FOR ASCITES TECHNIQUE: Limited ultrasound survey for ascites was performed in all four abdominal quadrants. COMPARISON:  CT abdomen pelvis dated March 14, 2022. FINDINGS: No free fluid noted in the abdomen. IMPRESSION: 1. No ascites. Electronically Signed   By: Titus Dubin M.D.   On: 09/20/2022 10:53   DG Chest Portable 1 View  Result Date: 09/19/2022 CLINICAL DATA:  Pacemaker discharged EXAM: PORTABLE CHEST 1 VIEW COMPARISON:  08/05/2022 FINDINGS: Stable cardiomegaly.  Aortic atherosclerotic calcification. Left chest wall CRT-D. Sternotomy. Cardiac valve prosthesis. Postoperative change of clavicle. Pulmonary vascular congestion. No focal consolidation, pleural effusion, or pneumothorax. IMPRESSION: Cardiomegaly and pulmonary vascular congestion. No change from 08/05/2022. Electronically Signed   By: Placido Sou M.D.   On: 09/19/2022 19:59    Cardiac Studies   R/L cardiac cath 04/2022 Conclusions: Moderate-severe multivessel coronary artery disease, including 50% proximal LAD stenosis (mid/distal LAD are small with prominent first septal branch consistent with "double-barrel LAD), occlusion of large proximal D1 (distal vessel is supplied by patent LIMA graft), moderate diffuse LCx disease, occluded OM2 branch supplied by ectatic but patent SVG, and sequential proximal through  distal RCA disease of up to 80-90% that is not hemodynamically significant (iFR = 0.99). Widely patent LIMA-D1. Ectatic but patent SVG-OM2. Patent overlapping proximal through distal RCA stents with mild in-stent restenosis in the proximal segment. Moderately elevated left heart filling pressures (LVEDP 25-30 mmHg, PWCP 30 mmHg with prominent V-waves). Moderately-severely elevated right heart and pulmonary artery pressures (mean RA 15 mmHg, mean PAP 42 mmHg). Low normal to mildly reduced Fick cardiac output/index (Fick CO/CI, 4.9 L/min, 2.4 L/min/m^2).   Recommendations: Continue medical therapy; no target for PCI.  I suspect elevated troponin represents supply-demand mismatch in the setting of chronic ischemic heart disease, acute on chronic HFrEF, and recent VT/VF with ICD shock. Aggressive diuresis as blood pressure and renal function allow. Escalate goal-directed medical therapy for HFrEF due to ischemic cardiomyopathy as blood pressure and renal function allow. Aggressive secondary prevention of coronary artery disease. Restart IV heparin 2 hours after TR band removal; transition back to apixaban as soon as tomorrow if there is no evidence of  bleeding or vascular injury from catheterization.   Findings and recommendations were discussed by telephone with the patient's legal guardian.   Nelva Bush, MD [facility]   Echo 02/2022 1. Left ventricular ejection fraction, by estimation, is 25 to 30%. The  left ventricle has severely decreased function. The left ventricle  demonstrates regional wall motion abnormalities (see scoring  diagram/findings for description). The left  ventricular internal cavity size was moderately dilated. There is mild  concentric left ventricular hypertrophy. Left ventricular diastolic  parameters are indeterminate. Elevated left ventricular end-diastolic  pressure.   2. Right ventricular systolic function is normal. The right ventricular  size is  normal.   3. Left atrial size was severely dilated.   4. Right atrial size was mildly dilated.   5. The mitral valve is normal in structure. Mild mitral valve  regurgitation. No evidence of mitral stenosis.   6. The aortic valve is tricuspid. Aortic valve regurgitation is mild. No  aortic stenosis is present.   7. The inferior vena cava is normal in size with <50% respiratory  variability, suggesting right atrial pressure of 8 mmHg.   Patient Profile     62 y.o. 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 - admitted in the setting of CHF, Optivol showing volume overload - seen 3/5 and was on amiodarone - he is at a facility and he has been taking amiodarone 400mg  daily - Keep K>4 and Mag>2 - EP consulted, plan to continue amiodarone 400mg  BID - no further VT noted on telemetry.   Acute on chronic combined systolic and diastolic CHF - long standing history of low EF 25-30%  - most recent echo 07/2022 showed LVEF 25-30%, severe MR - BNP 1582 and CXR with pulmonary vascular congestion - PTA Farxiga 10mg  daily, spironolactone 25mg  daily, Losartan 12.5mg  daily, Toprol 12.5mg  daily>held for low pressures - IV lasix 80mg  BID and midodrine  - PTA lasix 60mg  BID - Net -2.8L  - strict I/Os, daily weights and monitor kidney function function with diuresis - ACHF team following   CAD - cath 04/2022 showed 2/2 patent grafts with moderate RCA ISR and distal bifurcation disease with normal iFR - chest  pain after AICD discharge - HS troponin 62>64, minimally elevated with flat trend - no plan for further ischemic work-up - continue Crestor and Zetia - Imdur held for low pressures   Paroxysmal Afib - continue Eliquis 5mg  BID   Tobacco use - cessation recommended - nicotine patch per IM  For questions or updates, please contact [place] Please consult [email or web address] for contact info under        Signed, Selig [place] Ninfa Meeker, PA-C  09/21/2022, 10:59 AM

## 2022-09-21 NOTE — Assessment & Plan Note (Signed)
Continue patient's home regimen of levothyroxine at 50 mcg for now Patient's thyroid function 10 days ago was abnormal with a TSH of 14.422.  Will repeat TSH and free T4 to further titrate his levothyroxine dose.

## 2022-09-22 DIAGNOSIS — E878 Other disorders of electrolyte and fluid balance, not elsewhere classified: Secondary | ICD-10-CM | POA: Diagnosis not present

## 2022-09-22 DIAGNOSIS — Z720 Tobacco use: Secondary | ICD-10-CM | POA: Diagnosis not present

## 2022-09-22 DIAGNOSIS — Z4502 Encounter for adjustment and management of automatic implantable cardiac defibrillator: Secondary | ICD-10-CM | POA: Diagnosis not present

## 2022-09-22 DIAGNOSIS — I480 Paroxysmal atrial fibrillation: Secondary | ICD-10-CM

## 2022-09-22 DIAGNOSIS — I25118 Atherosclerotic heart disease of native coronary artery with other forms of angina pectoris: Secondary | ICD-10-CM | POA: Diagnosis not present

## 2022-09-22 DIAGNOSIS — I472 Ventricular tachycardia, unspecified: Secondary | ICD-10-CM | POA: Diagnosis not present

## 2022-09-22 LAB — CBC
HCT: 38.3 % — ABNORMAL LOW (ref 39.0–52.0)
Hemoglobin: 12.7 g/dL — ABNORMAL LOW (ref 13.0–17.0)
MCH: 29.8 pg (ref 26.0–34.0)
MCHC: 33.2 g/dL (ref 30.0–36.0)
MCV: 89.9 fL (ref 80.0–100.0)
Platelets: 189 10*3/uL (ref 150–400)
RBC: 4.26 MIL/uL (ref 4.22–5.81)
RDW: 16.5 % — ABNORMAL HIGH (ref 11.5–15.5)
WBC: 10.4 10*3/uL (ref 4.0–10.5)
nRBC: 0 % (ref 0.0–0.2)

## 2022-09-22 LAB — BASIC METABOLIC PANEL
Anion gap: 10 (ref 5–15)
BUN: 30 mg/dL — ABNORMAL HIGH (ref 8–23)
CO2: 24 mmol/L (ref 22–32)
Calcium: 8.9 mg/dL (ref 8.9–10.3)
Chloride: 96 mmol/L — ABNORMAL LOW (ref 98–111)
Creatinine, Ser: 1.7 mg/dL — ABNORMAL HIGH (ref 0.61–1.24)
GFR, Estimated: 45 mL/min — ABNORMAL LOW (ref >60)
Glucose, Bld: 95 mg/dL (ref 70–99)
Potassium: 3.7 mmol/L (ref 3.5–5.1)
Sodium: 130 mmol/L — ABNORMAL LOW (ref 135–145)

## 2022-09-22 LAB — GLUCOSE, CAPILLARY
Glucose-Capillary: 105 mg/dL — ABNORMAL HIGH (ref 70–99)
Glucose-Capillary: 174 mg/dL — ABNORMAL HIGH (ref 70–99)
Glucose-Capillary: 173 mg/dL — ABNORMAL HIGH (ref 70–99)
Glucose-Capillary: 116 mg/dL — ABNORMAL HIGH (ref 70–99)

## 2022-09-22 MED ORDER — POTASSIUM CHLORIDE CRYS ER 20 MEQ PO TBCR
40.0000 meq | EXTENDED_RELEASE_TABLET | Freq: Every day | ORAL | Status: DC
  Administered 2022-09-22 – 2022-09-24 (×3): 40 meq via ORAL
  Filled 2022-09-22 (×3): qty 2

## 2022-09-22 MED ORDER — METOLAZONE 2.5 MG PO TABS
2.5000 mg | ORAL_TABLET | Freq: Every day | ORAL | Status: DC
  Administered 2022-09-22 – 2022-09-24 (×3): 2.5 mg via ORAL
  Filled 2022-09-22 (×3): qty 1

## 2022-09-22 NOTE — Assessment & Plan Note (Signed)
Keep Mg > 2 and K > 4 Replaced potassium today

## 2022-09-22 NOTE — Progress Notes (Signed)
   09/22/22 1753  Mobility  Activity Ambulated independently to bathroom;Stood at bedside;Dangled on edge of bed  Level of Assistance Independent  Assistive Device None  Distance Ambulated (ft) 10 ft  Range of Motion/Exercises Active  Activity Response Tolerated well  Mobility Referral Yes  $Mobility charge 1 Mobility   Author responding to bed alarm. Pt OOB upon arrival entering bathroom indep without AD. Pt returned to EOB and given education on waiting for Nursing staff due to fall risk status. Pt left EOB to eat dinner with needs in reach and bed alarm activated. RN Notified.   Loma Sender Mobility Specialist 09/22/22, 6:03 PM

## 2022-09-22 NOTE — Assessment & Plan Note (Signed)
Unclear history of alcohol abuse. Fall precautions.  CIWA if needed.

## 2022-09-22 NOTE — Progress Notes (Signed)
Rounding Note    Patient Name: Carl [name] Date of Encounter: 09/22/2022  [place] Cardiologist: Kathlyn Sacramento, MD   Subjective   UOP -2L. Kidney function stable. Patient denies chest pain.  Inpatient Medications    Scheduled Meds:  amiodarone  400 mg Oral BID   apixaban  5 mg Oral BID   divalproex  500 mg Oral BID   escitalopram  10 mg Oral QHS   ezetimibe  10 mg Oral Daily   feeding supplement  237 mL Oral BID BM   furosemide  80 mg Intravenous BID   insulin aspart  0-5 Units Subcutaneous QHS   insulin aspart  0-9 Units Subcutaneous TID WC   levETIRAcetam  250 mg Oral BID   levothyroxine  50 mcg Oral Q0600   midodrine  2.5 mg Oral TID with meals   multivitamin with minerals  1 tablet Oral Daily   nicotine  14 mg Transdermal Daily   QUEtiapine  200 mg Oral QHS   rosuvastatin  10 mg Oral QHS   sodium chloride flush  3 mL Intravenous Q12H   Continuous Infusions:  PRN Meds: acetaminophen **OR** acetaminophen, albuterol   Vital Signs    Vitals:   09/22/22 0416 09/22/22 0424 09/22/22 0500 09/22/22 0755  BP:  97/70  (!) 93/58  Pulse: 72 72  66  Resp:  20  16  Temp: 98.5 F (36.9 C) 98.5 F (36.9 C)  98 F (36.7 C)  TempSrc: Oral     SpO2: 90% 92%  99%  Weight:   97.1 kg   Height:        Intake/Output Summary (Last 24 hours) at 09/22/2022 0921 Last data filed at 09/22/2022 0400 Gross per 24 hour  Intake 603 ml  Output 2045 ml  Net -1442 ml      09/22/2022    5:00 AM 09/21/2022    5:00 AM 09/20/2022    8:16 AM  Last 3 Weights  Weight (lbs) 214 lb 1.1 oz 211 lb 3.2 oz 211 lb 3.2 oz  Weight (kg) 97.1 kg 95.8 kg 95.8 kg      Telemetry    A sensed V paced HR 70s - Personally Reviewed  ECG    No new - Personally Reviewed  Physical Exam   GEN: No acute distress.   Neck: No JVD Cardiac: RRR, no murmurs, rubs, or gallops.  Respiratory:wheezing GI: Soft, nontender, +distended  MS: No edema; No deformity. Neuro:  Nonfocal  Psych:  Normal affect   Labs    High Sensitivity Troponin:   Recent Labs  Lab 09/19/22 1848 09/19/22 2042  TROPONINIHS 62* 64*     Chemistry Recent Labs  Lab 09/19/22 1848 09/21/22 0506 09/22/22 0501  NA 134* 134* 130*  K 3.5 3.2* 3.7  CL 100 98 96*  CO2 24 27 24   GLUCOSE 107* 97 95  BUN 21 23 30*  CREATININE 2.00* 1.68* 1.70*  CALCIUM 8.6* 8.8* 8.9  PROT 7.1  --   --   ALBUMIN 3.4*  --   --   AST 30  --   --   ALT 19  --   --   ALKPHOS 68  --   --   BILITOT 0.8  --   --   GFRNONAA 37* 45* 45*  ANIONGAP 10 9 10     Lipids No results for input(s): "CHOL", "TRIG", "HDL", "LABVLDL", "LDLCALC", "CHOLHDL" in the last 168 hours.  Hematology Recent Labs  Lab 09/19/22 [phone]  09/21/22 0506 09/22/22 0501  WBC 7.8 8.6 10.4  RBC 4.00* 4.09* 4.26  HGB 11.9* 12.3* 12.7*  HCT 37.0* 37.1* 38.3*  MCV 92.5 90.7 89.9  MCH 29.8 30.1 29.8  MCHC 32.2 33.2 33.2  RDW 16.7* 16.5* 16.5*  PLT 162 171 189   Thyroid No results for input(s): "TSH", "FREET4" in the last 168 hours.  BNP Recent Labs  Lab 09/19/22 1848  BNP 1,582.0*    DDimer No results for input(s): "DDIMER" in the last 168 hours.   Radiology    ECHOCARDIOGRAM LIMITED  Result Date: 09/20/2022    ECHOCARDIOGRAM LIMITED REPORT   Patient Name:   [name] Date of Exam: 09/20/2022 Medical Rec #:  [id number]    Height:       66.0 in Accession #:    [id number]   Weight:       211.2 lb Date of Birth:  [birth year]    BSA:          2.047 m Patient Age:    35 years     BP:           Not listed in chart not listed in                                            chart/Not listed in chart mmHg Patient Gender: M            HR:           65 bpm. Exam Location:  [facility] Procedure: Cardiac Doppler, Color Doppler and Limited Echo Indications:     Ventricular tachycardia I47.2  History:         Patient has prior history of Echocardiogram examinations, most                  recent 08/04/2022. Ischemic cardiomyopathy; Risk                   Factors:Hypertension.  Sonographer:     Sherrie Sport Referring Phys:  [place] Phys: Ida Rogue MD IMPRESSIONS  1. Left ventricular ejection fraction, by estimation, is <20%. Left ventricular ejection fraction by 2D MOD biplane is 17.5 %. The left ventricle has severely decreased function. The left ventricle demonstrates global hypokinesis, anterior/anteroseptal wall best preserved. The left ventricular internal cavity size was moderately to severely dilated.  2. Right ventricular systolic function is low normal. The right ventricular size is mildly enlarged. There is mildly elevated pulmonary artery systolic pressure. The estimated right ventricular systolic pressure is Q000111Q mmHg.  3. Left atrial size was moderately dilated.  4. The mitral valve is degenerative. Moderate to severe mitral valve regurgitation. Moderate mitral annular calcification. Gradient not measured, unable to exclude mild stenosis.  5. The aortic valve has an indeterminant number of cusps. Aortic valve regurgitation is not visualized. No aortic stenosis is present. FINDINGS  Left Ventricle: Left ventricular ejection fraction, by estimation, is <20%. Left ventricular ejection fraction by 2D MOD biplane is 17.5 %. The left ventricle has severely decreased function. The left ventricle demonstrates global hypokinesis. The left ventricular internal cavity size was moderately to severely dilated. There is no left ventricular hypertrophy. Right Ventricle: The right ventricular size is mildly enlarged. No increase in right ventricular wall thickness. Right ventricular systolic function is low normal. There is mildly elevated pulmonary artery systolic pressure. The tricuspid regurgitant  velocity is 2.76 m/s, and with an assumed right atrial pressure of 10 mmHg, the estimated right ventricular systolic pressure is Q000111Q mmHg. Left Atrium: Left atrial size was moderately dilated. Right Atrium: Right atrial size was normal in size.  Pericardium: There is no evidence of pericardial effusion. Mitral Valve: The mitral valve is degenerative in appearance. There is mild calcification of the mitral valve leaflet(s). Moderate mitral annular calcification. Moderate to severe mitral valve regurgitation. Tricuspid Valve: The tricuspid valve is normal in structure. Tricuspid valve regurgitation is mild . No evidence of tricuspid stenosis. Aortic Valve: The aortic valve has an indeterminant number of cusps. Aortic valve regurgitation is not visualized. No aortic stenosis is present. Pulmonic Valve: The pulmonic valve was normal in structure. Pulmonic valve regurgitation is not visualized. No evidence of pulmonic stenosis. Aorta: The aortic root is normal in size and structure. Venous: The inferior vena cava was not well visualized. IAS/Shunts: No atrial level shunt detected by color flow Doppler. Additional Comments: A device lead is visualized. Spectral Doppler performed. Color Doppler performed.  LEFT VENTRICLE PLAX 2D                        Biplane EF (MOD) LVIDd:         7.20 cm         LV Biplane EF:   Left LVIDs:         6.20 cm                          ventricular LV PW:         0.90 cm                          ejection LV IVS:        1.20 cm                          fraction by LVOT diam:     2.20 cm                          2D MOD LVOT Area:     3.80 cm                         biplane is                                                 17.5 %.  LV Volumes (MOD) LV vol d, MOD    241.0 ml A2C: LV vol d, MOD    235.0 ml A4C: LV vol s, MOD    191.0 ml A2C: LV vol s, MOD    205.0 ml A4C: LV SV MOD A2C:   50.0 ml LV SV MOD A4C:   235.0 ml LV SV MOD BP:    42.3 ml RIGHT VENTRICLE RV Basal diam:  3.80 cm RV Mid diam:    2.10 cm RV S prime:     8.16 cm/s TAPSE (M-mode): 1.8 cm LEFT ATRIUM             Index        RIGHT ATRIUM  Index LA diam:        6.00 cm 2.93 cm/m   RA Area:     18.70 cm LA Vol (A2C):   78.7 ml 38.45 ml/m  RA Volume:   53.60  ml  26.19 ml/m LA Vol (A4C):   88.9 ml 43.44 ml/m LA Biplane Vol: 86.0 ml 42.02 ml/m   AORTA Ao Root diam: 3.50 cm MR PISA:        18.16 cm  TRICUSPID VALVE MR PISA Radius: 1.70 cm    TR Peak grad:   30.5 mmHg                            TR Vmax:        276.00 cm/s                             SHUNTS                            Systemic Diam: 2.20 cm Ida Rogue MD Electronically signed by Ida Rogue MD Signature Date/Time: 09/20/2022/11:43:11 AM    Final (Updated)    [place] ASCITES (ABDOMEN LIMITED)  Result Date: 09/20/2022 CLINICAL DATA:  Abdominal distention. EXAM: LIMITED ABDOMEN ULTRASOUND FOR ASCITES TECHNIQUE: Limited ultrasound survey for ascites was performed in all four abdominal quadrants. COMPARISON:  CT abdomen pelvis dated March 14, 2022. FINDINGS: No free fluid noted in the abdomen. IMPRESSION: 1. No ascites. Electronically Signed   By: Titus Dubin M.D.   On: 09/20/2022 10:53    Cardiac Studies   R/L cardiac cath 04/2022 Conclusions: Moderate-severe multivessel coronary artery disease, including 50% proximal LAD stenosis (mid/distal LAD are small with prominent first septal branch consistent with "double-barrel LAD), occlusion of large proximal D1 (distal vessel is supplied by patent LIMA graft), moderate diffuse LCx disease, occluded OM2 branch supplied by ectatic but patent SVG, and sequential proximal through distal RCA disease of up to 80-90% that is not hemodynamically significant (iFR = 0.99). Widely patent LIMA-D1. Ectatic but patent SVG-OM2. Patent overlapping proximal through distal RCA stents with mild in-stent restenosis in the proximal segment. Moderately elevated left heart filling pressures (LVEDP 25-30 mmHg, PWCP 30 mmHg with prominent V-waves). Moderately-severely elevated right heart and pulmonary artery pressures (mean RA 15 mmHg, mean PAP 42 mmHg). Low normal to mildly reduced Fick cardiac output/index (Fick CO/CI, 4.9 L/min, 2.4 L/min/m^2).    Recommendations: Continue medical therapy; no target for PCI.  I suspect elevated troponin represents supply-demand mismatch in the setting of chronic ischemic heart disease, acute on chronic HFrEF, and recent VT/VF with ICD shock. Aggressive diuresis as blood pressure and renal function allow. Escalate goal-directed medical therapy for HFrEF due to ischemic cardiomyopathy as blood pressure and renal function allow. Aggressive secondary prevention of coronary artery disease. Restart IV heparin 2 hours after TR band removal; transition back to apixaban as soon as tomorrow if there is no evidence of bleeding or vascular injury from catheterization.   Findings and recommendations were discussed by telephone with the patient's legal guardian.   Nelva Bush, MD [facility]   Echo 02/2022 1. Left ventricular ejection fraction, by estimation, is 25 to 30%. The  left ventricle has severely decreased function. The left ventricle  demonstrates regional wall motion abnormalities (see scoring  diagram/findings for description). The left  ventricular internal cavity size was moderately dilated. There is  mild  concentric left ventricular hypertrophy. Left ventricular diastolic  parameters are indeterminate. Elevated left ventricular end-diastolic  pressure.   2. Right ventricular systolic function is normal. The right ventricular  size is normal.   3. Left atrial size was severely dilated.   4. Right atrial size was mildly dilated.   5. The mitral valve is normal in structure. Mild mitral valve  regurgitation. No evidence of mitral stenosis.   6. The aortic valve is tricuspid. Aortic valve regurgitation is mild. No  aortic stenosis is present.   7. The inferior vena cava is normal in size with <50% respiratory  variability, suggesting right atrial pressure of 8 mmHg.   Patient Profile     62 y.o. male with a hx of CAD status post CABG in 2017, ischemic cardiomyopathy EF of 30 to 35%  status post ICD 2000 sober to go through, paroxysmal A-fib on Eliquis, CVA, bipolar disorder, and smoker who is being seen 09/20/2022 for the evaluation of ICD shock   Assessment & Plan    AICD discharge VT - VT/Vfib arrest 04/2022, cath at that time showed stable CAD - recurrent VT 05/2022 K 2.7 at that time - recurrent VT storm 07/2021, smoking at the time, trialed on mexiletine - recurrent VT 3/13 and 3/15 treated with HV therapy, now admitted. VT in the setting of CHF, Optivol showing volume overload - he is at a facility and he has been taking amiodarone 400mg  daily - Keep K>4 and Mag>2 - EP consulted, plan to continue amiodarone 400mg  BID - no further VT noted on telemetry.   Acute on chronic combined systolic and diastolic CHF - long standing history of low EF 25-30%  - most recent echo 07/2022 showed LVEF 25-30%, severe MR - BNP 1582 and CXR with pulmonary vascular congestion - PTA Farxiga 10mg  daily, spironolactone 25mg  daily, Losartan 12.5mg  daily, Toprol 12.5mg  daily>held for low pressures - IV lasix 80mg  BID and midodrine  - PTA lasix 60mg  BID - Net -4L  - strict I/Os, daily weights and monitor kidney function function with diuresis - ACHF team following   CAD - cath 04/2022 showed 2/2 patent grafts with moderate RCA ISR and distal bifurcation disease with normal iFR - chest pain after AICD discharge - HS troponin 62>64, minimally elevated with flat trend - no plan for further ischemic work-up - continue Crestor and Zetia - Imdur held for low pressures   Paroxysmal Afib - continue Eliquis 5mg  BID   Tobacco use - cessation recommended - nicotine patch per IM  GOC - plan for hospice services at discharge  For questions or updates, please contact [place] Please consult [email or web address] for contact info under        Signed, Rayma Hegg Ninfa Meeker, PA-C  09/22/2022, 9:21 AM

## 2022-09-22 NOTE — Assessment & Plan Note (Signed)
Mild troponin elevation due to demand ischemia.

## 2022-09-22 NOTE — Progress Notes (Signed)
Progress Note   Patient: Carl [name] [id number] DOB: Nov 19, 1960 DOA: 09/19/2022     2 DOS: the patient was seen and examined on 09/22/2022   Brief hospital course:  62 y.o. male with a history of CAD s/p CABG, ICM, HFrEF, VT/VF s/p CRT-D, HTN, HL, PAF, tob abuse, COPD, CKD II-III, stroke, and bipolar disorder admitted after ICD discharge   3/15: cardio c/s, Palliative care c/s, echo 3/16: Legal guardian interested in hospice evaluation.  Referral sent 3/17: Metolazone added along with potassium replacement  Assessment and Plan: * AICD discharge Device interrogated - recurrent VT Continue amio & mexiletine per cardiology   Ventricular tachycardia ([place]) Medtronic device interrogation done Recurrent VT likely in the setting of CHF exacerbation Continue amiodarone and mexiletine for now.  Metolazone added Monitor on tele Cardio team following EF < 20% on echo this admission Overall poor prognosis.  Plan for hospice at discharge     Demand ischemia Mild troponin elevation due to demand ischemia.   Chest pain Present on admission, currently chest pain free.  Acute on chronic combined systolic and diastolic CHF (congestive heart failure) (HCC) EF < 20%, severe global hypokinesis on echo this admission (previous echo showed 25-30%) Continue IV lasix 80 BID and Midodrine  Net IO Since Admission: -4,139 mL [09/22/22 1252]  Advance CHF team c/sed - appreciate dr bensimhon input. Abdominal ultrasound did not show any ascites Metolazone added   Electrolyte abnormality Keep Mg > 2 and K > 4 Replaced potassium today  Essential hypertension Midodrine added as BP is soft   Hypothyroidism Continue patient's home regimen of levothyroxine at 50 mcg for now Patient's thyroid function 10 days ago was abnormal with a TSH of 14.422.  Will repeat TSH and free T4 to further titrate his levothyroxine dose.  [place] precautions - likely multifactorial No skilled PT/OT  need  History of alcohol abuse Unclear history of alcohol abuse. Fall precautions.  CIWA if needed.   History of stroke Continue Eliquis and crestor   Type 2 diabetes mellitus with hyperglycemia ([place]) SSI for now.  Paroxysmal atrial fibrillation (HCC) continue amiodarone for rate control and Eliquis for anticoagulation.  Cardiology following.  Monitor on telemetry    CAD in native artery Continue eliquis, crestor.   Goals of care, counseling/discussion Legal guardian agreeable and interested in hospice evaluation.  Hospice set up at discharge  Seizure disorder [facility]) Seizure precautions - continue patient on Keppra and Depakote.  Tobacco use Nicotine patch.   Abdominal aortic aneurysm (AAA) 35 to 39 mm in diameter (HCC) 3.1 cm infrarenal abdominal aortic aneurysm.  Monitoring for now        Subjective: Feeling better, no new issues  Physical Exam: Vitals:   09/22/22 0500 09/22/22 0755 09/22/22 0949 09/22/22 1223  BP:  (!) 93/58  (!) 93/56  Pulse:  66  65  Resp:  16  16  Temp:  98 F (36.7 C)  (!) 97.5 F (36.4 C)  TempSrc:      SpO2:  99%  98%  Weight: 97.1 kg  95.6 kg   Height:       62 year old male lying in the bed looking chronically ill Lungs Rales in the bases Neck: JVP elevated Heart: Regular rate and rhythm Abdomen: Soft, distended, hypoactive bowel sounds Skin no rash or lesion Neuro alert and awake, nonfocal Data Reviewed:  Sodium 130  Family Communication: Legal guardian was updated yesterday  Disposition: Status is: Inpatient Remains inpatient appropriate because: Ongoing diuresis  Planned Discharge  Destination:  [name] with hospice   DVT prophylaxis-Eliquis Time spent: 35 minutes  Author: Max Sane, MD 09/22/2022 12:53 PM  For on call review [email or web address].

## 2022-09-22 NOTE — Assessment & Plan Note (Signed)
EF < 20%, severe global hypokinesis on echo this admission (previous echo showed 25-30%) Continue IV lasix 80 BID and Midodrine  Net IO Since Admission: -4,139 mL [09/22/22 1252]  Advance CHF team c/sed - appreciate dr bensimhon input. Abdominal ultrasound did not show any ascites Metolazone added

## 2022-09-22 NOTE — Assessment & Plan Note (Signed)
SSI for now 

## 2022-09-22 NOTE — Assessment & Plan Note (Signed)
continue amiodarone for rate control and Eliquis for anticoagulation.  Cardiology following.  Monitor on telemetry

## 2022-09-22 NOTE — Assessment & Plan Note (Signed)
Continue Eliquis and crestor

## 2022-09-22 NOTE — Assessment & Plan Note (Signed)
Continue eliquis, crestor.

## 2022-09-22 NOTE — Assessment & Plan Note (Signed)
Medtronic device interrogation done Recurrent VT likely in the setting of CHF exacerbation Continue amiodarone and mexiletine for now.  Metolazone added Monitor on tele Cardio team following EF < 20% on echo this admission Overall poor prognosis.  Plan for hospice at discharge

## 2022-09-22 NOTE — Assessment & Plan Note (Signed)
Continue patient's home regimen of levothyroxine at 50 mcg for now Patient's thyroid function 10 days ago was abnormal with a TSH of 14.422.  Will repeat TSH and free T4 to further titrate his levothyroxine dose.

## 2022-09-22 NOTE — Assessment & Plan Note (Signed)
Seizure precautions - continue patient on Keppra and Depakote.

## 2022-09-22 NOTE — Assessment & Plan Note (Signed)
Fall precautions - likely multifactorial No skilled PT/OT need

## 2022-09-22 NOTE — Assessment & Plan Note (Signed)
-  Nicotine patch 

## 2022-09-22 NOTE — Progress Notes (Addendum)
[facility] ([facility])   DC date at this time is pending. [facility] HLT will continue to follow through discharge planning,   Please call with any questions/concerns.    Thank you for the opportunity to participate in this patient's care.   Phillis Haggis, MSW [facility] Liaison

## 2022-09-22 NOTE — Assessment & Plan Note (Signed)
Device interrogated - recurrent VT Continue amio & mexiletine per cardiology

## 2022-09-22 NOTE — Assessment & Plan Note (Addendum)
Midodrine added as BP is soft

## 2022-09-22 NOTE — Progress Notes (Addendum)
Mobility Specialist - Progress Note    09/22/22 1700  Mobility  Activity Ambulated with assistance in hallway;Stood at bedside;Dangled on edge of bed  Level of Assistance Contact guard assist, steadying assist  Assistive Device Front wheel walker  Distance Ambulated (ft) 200 ft  Range of Motion/Exercises Active  Activity Response Tolerated well  Mobility Referral Yes  $Mobility charge 1 Mobility   Nurse requested Mobility Specialist to perform oxygen saturation test with pt which includes removing pt from oxygen both at rest and while ambulating.  Below are the results from that testing.     Patient Saturations on Room Air at Rest = spO2 92%  Patient Saturations on Room Air while Ambulating = sp02 93% .  Rested and performed pursed lip breathing for 1 minute with sp02 at 90%.  At end of testing pt left in room on 2L (as needed)/ 0  Liters of oxygen.  Reported results to nurse.    Pt resting in bed on 2L upon entry. Pt a little irritable but agreeable to participate in ambulation. Pt ambulates to hallway around NS CGA with AD. Pt took x1 steated rest break. Pt returned to EOB and left with needs in reach and bed alarm activated.   Loma Sender Mobility Specialist 09/22/22, 5:53 PM

## 2022-09-22 NOTE — Assessment & Plan Note (Signed)
3.1 cm infrarenal abdominal aortic aneurysm.  Monitoring for now

## 2022-09-22 NOTE — Assessment & Plan Note (Signed)
Present on admission, currently chest pain free.

## 2022-09-22 NOTE — Assessment & Plan Note (Signed)
Legal guardian agreeable and interested in hospice evaluation.  Hospice set up at discharge

## 2022-09-23 ENCOUNTER — Inpatient Hospital Stay: Payer: Medicaid Other

## 2022-09-23 DIAGNOSIS — I714 Abdominal aortic aneurysm, without rupture, unspecified: Secondary | ICD-10-CM

## 2022-09-23 DIAGNOSIS — J810 Acute pulmonary edema: Secondary | ICD-10-CM | POA: Diagnosis not present

## 2022-09-23 DIAGNOSIS — Z4502 Encounter for adjustment and management of automatic implantable cardiac defibrillator: Secondary | ICD-10-CM | POA: Diagnosis not present

## 2022-09-23 DIAGNOSIS — E039 Hypothyroidism, unspecified: Secondary | ICD-10-CM

## 2022-09-23 DIAGNOSIS — Z720 Tobacco use: Secondary | ICD-10-CM

## 2022-09-23 DIAGNOSIS — J9601 Acute respiratory failure with hypoxia: Secondary | ICD-10-CM | POA: Diagnosis not present

## 2022-09-23 LAB — BASIC METABOLIC PANEL
Anion gap: 14 (ref 5–15)
BUN: 42 mg/dL — ABNORMAL HIGH (ref 8–23)
CO2: 25 mmol/L (ref 22–32)
Calcium: 9.5 mg/dL (ref 8.9–10.3)
Chloride: 94 mmol/L — ABNORMAL LOW (ref 98–111)
Creatinine, Ser: 1.82 mg/dL — ABNORMAL HIGH (ref 0.61–1.24)
GFR, Estimated: 41 mL/min — ABNORMAL LOW (ref >60)
Glucose, Bld: 102 mg/dL — ABNORMAL HIGH (ref 70–99)
Potassium: 3.5 mmol/L (ref 3.5–5.1)
Sodium: 133 mmol/L — ABNORMAL LOW (ref 135–145)

## 2022-09-23 LAB — MAGNESIUM: Magnesium: 2.6 mg/dL — ABNORMAL HIGH (ref 1.7–2.4)

## 2022-09-23 LAB — CBC
HCT: 39.7 % (ref 39.0–52.0)
Hemoglobin: 13.1 g/dL (ref 13.0–17.0)
MCH: 29.5 pg (ref 26.0–34.0)
MCHC: 33 g/dL (ref 30.0–36.0)
MCV: 89.4 fL (ref 80.0–100.0)
Platelets: 197 10*3/uL (ref 150–400)
RBC: 4.44 MIL/uL (ref 4.22–5.81)
RDW: 16.4 % — ABNORMAL HIGH (ref 11.5–15.5)
WBC: 8.1 10*3/uL (ref 4.0–10.5)
nRBC: 0 % (ref 0.0–0.2)

## 2022-09-23 LAB — GLUCOSE, CAPILLARY
Glucose-Capillary: 101 mg/dL — ABNORMAL HIGH (ref 70–99)
Glucose-Capillary: 140 mg/dL — ABNORMAL HIGH (ref 70–99)
Glucose-Capillary: 130 mg/dL — ABNORMAL HIGH (ref 70–99)
Glucose-Capillary: 86 mg/dL (ref 70–99)

## 2022-09-23 MED ORDER — METOPROLOL SUCCINATE ER 25 MG PO TB24
12.5000 mg | ORAL_TABLET | Freq: Every day | ORAL | Status: DC
  Administered 2022-09-23: 12.5 mg via ORAL
  Filled 2022-09-23: qty 1

## 2022-09-23 MED ORDER — HYDROCODONE-ACETAMINOPHEN 5-325 MG PO TABS
1.0000 | ORAL_TABLET | ORAL | Status: DC | PRN
  Administered 2022-09-23: 1 via ORAL
  Filled 2022-09-23: qty 1

## 2022-09-23 NOTE — Progress Notes (Signed)
Manufacturing engineer [facility])  Liaison Note  Patient will discharge tomorrow to [facility].  Continued collaboration with patient/family and [facility] staff ongoing through final disposition.  Please call with any hospice related questions or concerns.  Dimas Aguas, RN Nurse Liaison [phone]

## 2022-09-23 NOTE — Progress Notes (Signed)
       CROSS COVER NOTE  NAME: Hudsen Sechler MRN: [id number] DOB : 05-18-1961 ATTENDING PHYSICIAN: Max Sane, MD    Date of Service   09/23/2022   HPI/Events of Note   Report *** On Review of chart *** Bedside eval***Does not have active SI, unhappy with facility. Reports he feels disrespected and endorses both SI and HI when at the facility. Sitter at bedside HPI***  Interventions   Assessment/Plan: SI precautions Psych consult X    *** professional thanks      To reach the provider On-Call:   7AM- 7PM see care teams to locate the attending and reach out to them via [email or web address] Password: [email or web address] 7PM-7AM contact night-coverage If you still have difficulty reaching the appropriate provider, please page the [facility] (Director on Call) for [facility] on amion for assistance  This document was prepared using Systems analyst and may include unintentional dictation errors.  Neomia Glass DNP, MBA, FNP-BC, PMHNP-BC Nurse Practitioner [facility] Pager [phone]

## 2022-09-23 NOTE — TOC Progression Note (Signed)
Transition of Care [facility]) - Progression Note    Patient Details  Name: Carl [name] MRN: [id number] Date of Birth: [birth year]  Transition of Care [facility]) CM/SW Contact  Ross Ludwig, [place] Phone Number: 09/23/2022, 10:46 AM  Clinical Narrative:     CSW spoke to Melida Quitter [phone] administrator of [name].  Per Madelynn Done if patient is medically ready for discharge tomorrow, they can transport before noon, if after noon, patient will need EMS transport.  Clement requested FL2 and DC summary to be faxed to [phone].  TOC to continue to follow patient's progress throughout discharge planning.   Expected Discharge Plan: Assisted Living Barriers to Discharge: Continued Medical Work up  Expected Discharge Plan and Services     Post Acute Care Choice: NA Living arrangements for the past 2 months: Assisted Living Facility                                       Social Determinants of Health (SDOH) Interventions SDOH Screenings   Food Insecurity: No Food Insecurity (09/20/2022)  Housing: Low Risk  (09/20/2022)  Transportation Needs: No Transportation Needs (09/20/2022)  Utilities: Not At Risk (09/20/2022)  Alcohol Screen: Low Risk  (05/15/2019)  Tobacco Use: High Risk (09/19/2022)    Readmission Risk Interventions    08/05/2022    8:36 AM 03/15/2022    2:44 PM  Readmission Risk Prevention Plan  Transportation Screening Complete   HRI or [place]  Complete  Social Work Consult for [place] Planning/Counseling  Complete  Palliative Care Screening  Not Applicable  Medication Review Press photographer) Complete Complete  PCP or Specialist appointment within 3-5 days of discharge Complete   HRI or [place] Complete   [place] Not Applicable

## 2022-09-23 NOTE — Progress Notes (Signed)
Advanced Heart Failure Team Consult Note   Primary Physician: Pcp, No PCP-Cardiologist:  Kathlyn Sacramento, MD  Reason for Consultation: Heart failure  Subjective / Interval hx  - 6L since admit - Euvolemic on exam; no respiratory distress - No arrhythmias on telemetry past 24h Objective:    Vital Signs:   Temp:  [97.5 F (36.4 C)-98.3 F (36.8 C)] 97.6 F (36.4 C) (03/18 0819) Pulse Rate:  [60-68] 63 (03/18 0819) Resp:  [16-19] 18 (03/18 0819) BP: (93-107)/(52-69) 107/68 (03/18 0819) SpO2:  [89 %-98 %] 96 % (03/18 0819) Weight:  [79.1 kg-95.6 kg] 79.1 kg (03/18 0838) Last BM Date : 09/22/22  Weight change: Filed Weights   09/22/22 0500 09/22/22 0949 09/23/22 0838  Weight: 97.1 kg 95.6 kg 79.1 kg    Intake/Output:   Intake/Output Summary (Last 24 hours) at 09/23/2022 0929 Last data filed at 09/23/2022 0600 Gross per 24 hour  Intake 720 ml  Output 2750 ml  Net -2030 ml       Physical Exam    General:  Chronically ill appearing. No resp difficulty Neck: supple. JVP 8-9 Carotids 2+ bilat; no bruits. No lymphadenopathy or thryomegaly appreciated. Cor: PMI nondisplaced. Regular rate & rhythm. No rubs, gallops or murmurs. Lungs: coarse diffusely Abdomen: soft, nontender, nondistended. No hepatosplenomegaly. No bruits or masses. Good bowel sounds. Extremities: no cyanosis, clubbing, rash, No Neuro: alert & orientedx3, cranial nerves grossly intact. moves all 4 extremities w/o difficulty. Affect pleasant  EKG    Sinus BiV pacing 70s  Labs   Basic Metabolic Panel: Recent Labs  Lab 09/17/22 1043 09/19/22 1848 09/21/22 0506 09/22/22 0501 09/23/22 0534  NA 132* 134* 134* 130* 133*  K 4.9 3.5 3.2* 3.7 3.5  CL 99 100 98 96* 94*  CO2 24 24 27 24 25   GLUCOSE 75 107* 97 95 102*  BUN 25* 21 23 30* 42*  CREATININE 2.23* 2.00* 1.68* 1.70* 1.82*  CALCIUM 9.2 8.6* 8.8* 8.9 9.5     Liver Function Tests: Recent Labs  Lab 09/19/22 1848  AST 30  ALT 19   ALKPHOS 68  BILITOT 0.8  PROT 7.1  ALBUMIN 3.4*    No results for input(s): "LIPASE", "AMYLASE" in the last 168 hours. Recent Labs  Lab 09/20/22 0917  AMMONIA 38*    CBC: Recent Labs  Lab 09/17/22 1043 09/19/22 1848 09/21/22 0506 09/22/22 0501 09/23/22 0534  WBC 7.7 7.8 8.6 10.4 8.1  NEUTROABS 5.3 5.4  --   --   --   HGB 13.1 11.9* 12.3* 12.7* 13.1  HCT 39.3 37.0* 37.1* 38.3* 39.7  MCV 90.8 92.5 90.7 89.9 89.4  PLT 208 162 171 189 197     Cardiac Enzymes: No results for input(s): "CKTOTAL", "CKMB", "CKMBINDEX", "TROPONINI" in the last 168 hours.  BNP: BNP (last 3 results) Recent Labs    08/06/22 0123 08/07/22 0115 09/19/22 1848  BNP 1,023.0* 846.9* 1,582.0*     ProBNP (last 3 results) No results for input(s): "PROBNP" in the last 8760 hours.   CBG: Recent Labs  Lab 09/22/22 0753 09/22/22 1224 09/22/22 1649 09/22/22 2046 09/23/22 0820  GLUCAP 105* 174* 173* 116* 101*     Coagulation Studies: No results for input(s): "LABPROT", "INR" in the last 72 hours.   Imaging   No results found.   Medications:     Current Medications:  amiodarone  400 mg Oral BID   apixaban  5 mg Oral BID   divalproex  500 mg Oral BID  escitalopram  10 mg Oral QHS   ezetimibe  10 mg Oral Daily   feeding supplement  237 mL Oral BID BM   furosemide  80 mg Intravenous BID   insulin aspart  0-5 Units Subcutaneous QHS   insulin aspart  0-9 Units Subcutaneous TID WC   levETIRAcetam  250 mg Oral BID   levothyroxine  50 mcg Oral Q0600   metolazone  2.5 mg Oral Daily   midodrine  2.5 mg Oral TID with meals   multivitamin with minerals  1 tablet Oral Daily   nicotine  14 mg Transdermal Daily   potassium chloride  40 mEq Oral Daily   QUEtiapine  200 mg Oral QHS   rosuvastatin  10 mg Oral QHS   sodium chloride flush  3 mL Intravenous Q12H    Infusions:    Assessment/Plan     1.  VT/VF arrest with ICD shock: - being managed by EP and gen cards -  continue amio and mexilitene - - Kep K > 4.0 Mg > 2.0  2.  Acute on  Chronic heart failure with reduced ejection fraction/ischemic cardiomyopathy:  - EF 25 to 30% by echo in August 2023.   - Repeat echo done today 09/20/22 EF 15-20% - Optivol reading shows fluid is up and down a lot (in setting of high volume soda intake at SNF). ReDS 46% (normal 20-35% lung water) - BP soft but currently does not appear low output - Continue midodrine 2.5mg  TID for BP support - He is nearing end-stage but currently not candidate for advanced therapies with tobacco use, immobility and poor insight -Off GDMT due to low BP - Continue palliative involvement - Appears euvolemic today, consistent with rise in sCr to 1.8 and BUN to 40; 2.7L urine output over the past 24h. After AM dose of IV lasix will transition to torsemide 60mg  daily.  - Bps stable over the past 24h. Will add on toprol 12.5mg  XL qHS to help with VT burden. Add on SGLT2i tomorrow.   3.  Coronary artery disease/demand ischemia: Status post recent catheterization October 2023 revealing 2 of 2 patent grafts with moderate RCA in-stent restenosis and distal bifurcation disease with normal iFR.  Patient denies experiencing chest pain recently.  - no evidence ACS hstrop 62 -> 64 - continue medical management   4.  Paroxysmal atrial fibrillation: Maintaining sinus rhythm.  Anticoagulated with Eliquis.  Now on Amio in the setting of VT/VF  5.  Ongoing tobacco abuse/COPD: Cessation advised but he is not interested   6.  Bipolar disorder: Per medicine team.  7. CKD 3b - SCr at baseline 1.9-2.0 - adding on SGLT2I tomorrow AM.   Length of Stay: Arvin, DO  09/23/2022, 9:29 AM  Advanced Heart Failure Team Pager [phone] (M-F; 7a - 5p)  Please contact [facility] for night-coverage after hours (4p -7a ) and weekends on [email or web address]

## 2022-09-23 NOTE — Progress Notes (Signed)
Physical Therapy Treatment Patient Details Name: Carl [name] MRN: [id number] DOB: 04-05-1961 Today's Date: 09/23/2022   History of Present Illness Carl [name] is a 49yoM who [facility] on March 13 from his LTC facility c chest pain, progressive dyspnea. Pt went into vtach spontaneously and received shocks from his AICD. PMH: BPD, CAD, HF, CKD3, COPD, HTN, seizures, stroke.    PT Comments    Pt received in bed, had been up to the chair earlier. Pt voicing concerns about L foot pain which appears to have a Stage I pressure sore present with pain upon palpation, MD and nursing aware with x-ray done as a precaution. Pt declined mobility, education provided on positioning and importance of continued LE exercises to facilitate circulation and heeling. B LE's elevated with heels floating. Will continue to follow per POC until cleared for d/c with Hospice services.   Recommendations for follow up therapy are one component of a multi-disciplinary discharge planning process, led by the attending physician.  Recommendations may be updated based on patient status, additional functional criteria and insurance authorization.  Follow Up Recommendations  No PT follow up     Assistance Recommended at Discharge Intermittent Supervision/Assistance  Patient can return home with the following A little help with walking and/or transfers;A little help with bathing/dressing/bathroom;Help with stairs or ramp for entrance;Assist for transportation;Direct supervision/assist for financial management   Equipment Recommendations  None recommended by PT    Recommendations for Other Services       Precautions / Restrictions Precautions Precautions: Fall Restrictions Weight Bearing Restrictions: No Other Position/Activity Restrictions:  (Both heels off bed at all times)     Mobility  Bed Mobility               General bed mobility comments:  (deferred)    Transfers                         Ambulation/Gait                   Stairs             Wheelchair Mobility    Modified Rankin (Stroke Patients Only)       Balance                                            Cognition Arousal/Alertness: Awake/alert Behavior During Therapy: WFL for tasks assessed/performed Overall Cognitive Status: Within Functional Limits for tasks assessed                                 General Comments:  (Very pleasant and cooperative)        Exercises General Exercises - Lower Extremity Ankle Circles/Pumps: AROM, Both, 20 reps Heel Slides: AROM, Both, 10 reps Straight Leg Raises: AROM, Both, 5 reps Other Exercises Other Exercises: Pt educated on role of PT. Discussed current Stage I pressure sore on Left heel and importance of keeping off bed. Pt with good understanding    General Comments        Pertinent Vitals/Pain Pain Assessment Pain Assessment: Faces Faces Pain Scale: Hurts little more Pain Location:  (Left heel) Pain Descriptors / Indicators: Aching, Sharp Pain Intervention(s): Limited activity within patient's tolerance, Repositioned, Patient requesting pain meds-RN notified    Home Living  Prior Function            PT Goals (current goals can now be found in the care plan section) Acute Rehab PT Goals Patient Stated Goal: remain mobile and strong while admitted    Frequency    Min 2X/week      PT Plan Current plan remains appropriate    Co-evaluation              AM-PAC PT "6 Clicks" Mobility   Outcome Measure  Help needed turning from your back to your side while in a flat bed without using bedrails?: A Little Help needed moving from lying on your back to sitting on the side of a flat bed without using bedrails?: A Little Help needed moving to and from a bed to a chair (including a wheelchair)?: A Little Help needed standing up from a chair using your arms  (e.g., wheelchair or bedside chair)?: A Little Help needed to walk in hospital room?: A Little Help needed climbing 3-5 steps with a railing? : A Little 6 Click Score: 18    End of Session   Activity Tolerance: Patient limited by pain Patient left: in bed;with call bell/phone within reach;with bed alarm set Nurse Communication: Mobility status;Other (comment) (Left heel sore concerns) PT Visit Diagnosis: Other abnormalities of gait and mobility (R26.89)     Time: [id number] PT Time Calculation (min) (ACUTE ONLY): 12 min  Charges:  $Therapeutic Exercise: 8-22 mins           Mikel Cella, PTA  Josie Dixon 09/23/2022, 2:09 PM

## 2022-09-23 NOTE — Progress Notes (Signed)
Palliative Care Progress Note, Assessment & Plan   Patient Name: Carl [name]       Date: 09/23/2022 DOB: Oct 20, 1960  Age: 62 y.o. MRN#: [id number] Attending Physician: Max Sane, MD Primary Care Physician: Pcp, No Admit Date: 09/19/2022  Subjective: Patient is out of bed and sitting in the recliner.  He he acknowledges my presence and is able to make his wishes known.  He has acute complaints of left ankle pain.  No family or friends present at bedside.  HPI: 62 y.o. male  with past medical history of coronary artery disease, CABG 2017, ischemic cardiomyopathy ejection fraction 25 to 30%, ICD, paroxysmal atrial fibrillation on Eliquis, history of stroke, bipolar disorder, smoke  admitted on 09/19/2022 with progressive SOB and ICD discharge.    In ED, patient's AICD was interrogated, which revealed VT that degenerated to VF resulting in ICD shock of which the patient was unaware.   PMT was consulted to discuss [place].   Pt is familiar to me as I saw patient during September 2023 hospitalization.   Summary of counseling/coordination of care: After reviewing the patient's chart and assessing the patient at bedside, I discussed pain in left ankle.  Patient endorses pain was rapid in onset and is affecting his entire foot.  Left ankle is not swollen or warm to touch and pedal pulses are palpable.  Patient's skin is extremely sensitive to touch and has limited range of motion.  X-ray reveals no abnormalities. No skin breakdown noted.   I counseled with attending Dr. Brigitte Pulse, dayshift RN, and hospice liaison in regards to pain management. Pain medication increased to Norco and RN advised to give dose now.   I spoke with patient regards to plan and goals of care.  While patient has legal guardian, wanted to see if  patient understands plan of care.  Patient states he is going to go home and be Comfortable there.  He shares he does not want to come back to the hospital.  Goals are set.  Plan to d/c with hospice to follow - likely tomorrow.  DNR remains. TOC and [facility] hospice liaison following closely for disposition planning.  PMT will continue to follow and support patient throughout his hospitalization.  Physical Exam Constitutional:      General: He is not in acute distress.    Appearance: He is not ill-appearing.  HENT:     Head: Normocephalic.  Eyes:     Pupils: Pupils are equal, round, and reactive to light.  Cardiovascular:     Rate and Rhythm: Normal rate.     Heart sounds: Normal heart sounds.  Pulmonary:     Effort: Pulmonary effort is normal.     Breath sounds: Normal breath sounds.  Musculoskeletal:     Comments: Pain with palpation and passive ROM of left ankle  Skin:    General: Skin is warm and dry.  Neurological:     Mental Status: He is alert.  Psychiatric:        Mood and Affect: Mood is not anxious.        Behavior: Behavior normal. Behavior is not agitated.             Total Time  35 minutes   Shelden Raborn L. Ilsa Iha, FNP-BC Palliative Medicine Team Team Phone # [phone]

## 2022-09-24 ENCOUNTER — Other Ambulatory Visit (HOSPITAL_COMMUNITY): Payer: Self-pay

## 2022-09-24 DIAGNOSIS — F319 Bipolar disorder, unspecified: Secondary | ICD-10-CM | POA: Diagnosis not present

## 2022-09-24 DIAGNOSIS — I5043 Acute on chronic combined systolic (congestive) and diastolic (congestive) heart failure: Secondary | ICD-10-CM | POA: Diagnosis not present

## 2022-09-24 DIAGNOSIS — I2089 Other forms of angina pectoris: Secondary | ICD-10-CM | POA: Diagnosis not present

## 2022-09-24 DIAGNOSIS — J810 Acute pulmonary edema: Secondary | ICD-10-CM | POA: Diagnosis not present

## 2022-09-24 DIAGNOSIS — Z4502 Encounter for adjustment and management of automatic implantable cardiac defibrillator: Secondary | ICD-10-CM | POA: Diagnosis not present

## 2022-09-24 DIAGNOSIS — J9601 Acute respiratory failure with hypoxia: Secondary | ICD-10-CM | POA: Diagnosis not present

## 2022-09-24 DIAGNOSIS — I472 Ventricular tachycardia, unspecified: Secondary | ICD-10-CM | POA: Diagnosis not present

## 2022-09-24 LAB — BASIC METABOLIC PANEL
Anion gap: 13 (ref 5–15)
BUN: 52 mg/dL — ABNORMAL HIGH (ref 8–23)
CO2: 24 mmol/L (ref 22–32)
Calcium: 9.5 mg/dL (ref 8.9–10.3)
Chloride: 92 mmol/L — ABNORMAL LOW (ref 98–111)
Creatinine, Ser: 2.03 mg/dL — ABNORMAL HIGH (ref 0.61–1.24)
GFR, Estimated: 36 mL/min — ABNORMAL LOW (ref >60)
Glucose, Bld: 165 mg/dL — ABNORMAL HIGH (ref 70–99)
Potassium: 3.6 mmol/L (ref 3.5–5.1)
Sodium: 129 mmol/L — ABNORMAL LOW (ref 135–145)

## 2022-09-24 LAB — THYROID PANEL WITH TSH
Free Thyroxine Index: 1.8 (ref 1.2–4.9)
T3 Uptake Ratio: 29 % (ref 24–39)
T4, Total: 6.2 ug/dL (ref 4.5–12.0)
TSH: 21.2 u[IU]/mL — ABNORMAL HIGH (ref 0.450–4.500)

## 2022-09-24 LAB — GLUCOSE, CAPILLARY
Glucose-Capillary: 107 mg/dL — ABNORMAL HIGH (ref 70–99)
Glucose-Capillary: 121 mg/dL — ABNORMAL HIGH (ref 70–99)

## 2022-09-24 MED ORDER — TORSEMIDE 60 MG PO TABS: 60.0000 mg | ORAL_TABLET | Freq: Every day | ORAL | 0 refills | Status: DC

## 2022-09-24 MED ORDER — MELATONIN 5 MG PO TABS
5.0000 mg | ORAL_TABLET | Freq: Once | ORAL | Status: AC
  Administered 2022-09-24: 5 mg via ORAL
  Filled 2022-09-24: qty 1

## 2022-09-24 MED ORDER — MIDODRINE HCL 2.5 MG PO TABS: 2.5000 mg | ORAL_TABLET | Freq: Three times a day (TID) | ORAL | 0 refills | Status: AC

## 2022-09-24 MED ORDER — METOPROLOL SUCCINATE ER 25 MG PO TB24: 12.5000 mg | ORAL_TABLET | Freq: Every day | ORAL | 0 refills | Status: AC

## 2022-09-24 NOTE — Progress Notes (Signed)
[facility])    Plan is for patient to d/c today w/ [facility] services to follow.   If applicable, please send signed and completed DNR with patient/family upon discharge. Please provide prescriptions at discharge as needed to ensure ongoing symptom management and a transport packet.   [facility] information and contact numbers given to family and above information shared with TOC.    Please call with any questions/concerns.    Thank you for the opportunity to participate in this patient's care   Phillis Haggis, MSW [facility] Liaison  [phone]

## 2022-09-24 NOTE — NC FL2 (Signed)
[place] LEVEL OF CARE FORM     IDENTIFICATION  Patient Name: Carl [name] Birthdate: [birth year] Sex: male Admission Date (Current Location): 09/19/2022  [facility] and [place] Number:      Facility and Address:  [facility], [address] [place], [place] [address]      Provider Number: [id number]  Attending Physician Name and Address:  Max Sane, MD  Relative Name and Phone Number:  [place]  [phone]    Current Level of Care: Hospital Recommended Level of Care: [place] Prior Approval Number:    Date Approved/Denied:   PASRR Number:    Discharge Plan:  (Assisted Living)    Current Diagnoses: Patient Active Problem List   Diagnosis Date Noted   Goals of care, counseling/discussion 09/21/2022   Electrolyte abnormality 09/20/2022   Acute pulmonary edema ([place]) 09/20/2022   AICD discharge 04/27/2022   Non-ST elevation (NSTEMI) myocardial infarction [facility])    Ventricular fibrillation ([place])    Ventricular tachycardia ([place]) 04/23/2022   Acute respiratory failure with hypoxia ([place]) 04/22/2022   Hypokalemia 04/22/2022   Demand ischemia    Type 2 diabetes mellitus with hyperglycemia ([place]) 03/25/2022   Overweight (BMI 25.0-29.9) 03/23/2022   COVID-19 virus infection 03/23/2022   Respiratory failure ([place]) 03/22/2022   Dilated cardiomyopathy ([place])    Chest pain    COPD exacerbation ([place]) 03/14/2022   Seizure disorder ([place]) 03/14/2022   Dyspnea    Congestive heart failure ([place]) 02/15/2022   Anxiety 02/15/2022   Tobacco use 02/15/2022   Elevated partial thromboplastin time (PTT) 02/15/2022   Abdominal pain 02/15/2022   HFrEF (heart failure with reduced ejection fraction) (HCC)    Weakness    COPD with acute exacerbation ([place]) 05/23/2021   Paroxysmal atrial fibrillation ([place]) 05/23/2021   Chronic anticoagulation 05/23/2021   Abdominal aortic aneurysm (AAA) 35 to 39 mm in diameter ([place])  05/23/2021   Ischemic cardiomyopathy 05/22/2020   Chronic combined systolic and diastolic heart failure ([place]) 05/22/2020   HCAP (healthcare-associated pneumonia) 01/24/2020   Hyperkalemia 01/24/2020   Acute on chronic combined systolic and diastolic CHF (congestive heart failure) ([place]) 01/24/2020   History of seizure 01/24/2020   HLD (hyperlipidemia) 01/24/2020   Ascites due to alcoholic cirrhosis ([place])    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/18/2019   Elevated troponin 11/18/2019   Hypoxia 11/18/2019   Acute renal failure superimposed on stage 3a chronic kidney disease ([place]) 11/18/2019   CAD in native artery 05/15/2019   History of stroke 05/15/2019   Homicidal ideation 05/15/2019   Hypothyroidism 05/15/2019   Constipation 05/06/2019   Scrotal mass 05/06/2019   Abnormal urine odor 05/06/2019   Bipolar 1 disorder ([place]) 12/23/2018   Chronic pain of left knee 09/17/2018   Atypical chest pain 10/11/2017   Polypharmacy 08/05/2017   Cerebrovascular accident (CVA) due to stenosis of left middle cerebral artery ([place]) 06/26/2017   Urinary retention due to benign prostatic hyperplasia 06/22/2017   Falls 06/20/2017   Risk for falls 02/19/2017   Dyslipidemia 11/19/2016   Peripheral artery disease ([place]) 08/28/2016   Claudication ([place]) 08/09/2016   History of alcohol abuse 08/09/2016   History of drug dependence/abuse ([place]) 08/09/2016   Severe single current episode of major depressive disorder, without psychotic features ([place]) 08/09/2016   Mitral insufficiency 01/04/2016   Prediabetes 12/29/2015   Essential hypertension 12/29/2015   Fracture of left clavicle 07/13/2012    Orientation RESPIRATION BLADDER Height & Weight     Self  Normal  Continent Weight: 80.2 kg Height:  5\' 6"  (167.6 cm)  BEHAVIORAL SYMPTOMS/MOOD NEUROLOGICAL BOWEL NUTRITION STATUS  Other (Comment) (n/a)  (n/a) Continent Diet (Heart Healthy)  AMBULATORY STATUS COMMUNICATION OF NEEDS Skin   Limited  Assist Verbally Normal                       Personal Care Assistance Level of Assistance  Bathing, Dressing Bathing Assistance: Limited assistance   Dressing Assistance: Limited assistance     Functional Limitations Info  Sight Sight Info: Impaired        SPECIAL CARE FACTORS FREQUENCY                       Contractures Contractures Info: Not present    Additional Factors Info  Code Status, Allergies Code Status Info: DNR Allergies Info: Iodinated Contrast Media, Penicillins, Strawberry Extract, Cefepime, Erythromycin, Sulfa Antibiotics           Current Medications (09/24/2022):  This is the current hospital active medication list Current Facility-Administered Medications  Medication Dose Route Frequency Provider Last Rate Last Admin   acetaminophen (TYLENOL) tablet 650 mg  650 mg Oral Q6H PRN Para Skeans, MD       Or   acetaminophen (TYLENOL) suppository 650 mg  650 mg Rectal Q6H PRN Para Skeans, MD       albuterol (PROVENTIL) (2.5 MG/3ML) 0.083% nebulizer solution 2.5 mg  2.5 mg Inhalation Q6H PRN Florina Ou V, MD   2.5 mg at 09/21/22 0151   amiodarone (PACERONE) tablet 400 mg  400 mg Oral BID Minna Merritts, MD   400 mg at 09/24/22 0930   apixaban (ELIQUIS) tablet 5 mg  5 mg Oral BID Florina Ou V, MD   5 mg at 09/24/22 0930   divalproex (DEPAKOTE ER) 24 hr tablet 500 mg  500 mg Oral BID Florina Ou V, MD   500 mg at 09/24/22 0933   escitalopram (LEXAPRO) tablet 10 mg  10 mg Oral QHS Florina Ou V, MD   10 mg at 09/23/22 2302   ezetimibe (ZETIA) tablet 10 mg  10 mg Oral Daily Florina Ou V, MD   10 mg at 09/24/22 0930   feeding supplement (ENSURE ENLIVE / ENSURE PLUS) liquid 237 mL  237 mL Oral BID BM Max Sane, MD   237 mL at 09/24/22 0929   HYDROcodone-acetaminophen (NORCO/VICODIN) 5-325 MG per tablet 1 tablet  1 tablet Oral Q4H PRN Max Sane, MD   1 tablet at 09/23/22 1407   insulin aspart (novoLOG) injection 0-5 Units  0-5 Units  Subcutaneous QHS Florina Ou V, MD       insulin aspart (novoLOG) injection 0-9 Units  0-9 Units Subcutaneous TID WC Para Skeans, MD   1 Units at 09/23/22 1733   levETIRAcetam (KEPPRA) tablet 250 mg  250 mg Oral BID Para Skeans, MD   250 mg at 09/24/22 0934   levothyroxine (SYNTHROID) tablet 50 mcg  50 mcg Oral Q0600 Para Skeans, MD   50 mcg at 09/24/22 0933   metolazone (ZAROXOLYN) tablet 2.5 mg  2.5 mg Oral Daily Minna Merritts, MD   2.5 mg at 09/24/22 0933   metoprolol succinate (TOPROL-XL) 24 hr tablet 12.5 mg  12.5 mg Oral QHS Sabharwal, Aditya, DO   12.5 mg at 09/23/22 2301   midodrine (PROAMATINE) tablet 2.5 mg  2.5 mg Oral TID with meals Gollan, Kathlene November, MD   2.5  mg at 09/24/22 0930   multivitamin with minerals tablet 1 tablet  1 tablet Oral Daily Max Sane, MD   1 tablet at 09/24/22 0930   nicotine (NICODERM CQ - dosed in mg/24 hours) patch 14 mg  14 mg Transdermal Daily Florina Ou V, MD   14 mg at 09/24/22 0929   potassium chloride SA (KLOR-CON M) CR tablet 40 mEq  40 mEq Oral Daily Minna Merritts, MD   40 mEq at 09/24/22 0930   QUEtiapine (SEROQUEL) tablet 200 mg  200 mg Oral QHS Florina Ou V, MD   200 mg at 09/23/22 2302   rosuvastatin (CRESTOR) tablet 10 mg  10 mg Oral QHS Florina Ou V, MD   10 mg at 09/23/22 2302   sodium chloride flush (NS) 0.9 % injection 3 mL  3 mL Intravenous Q12H Para Skeans, MD   3 mL at 09/24/22 [id number]     Discharge Medications: Please see discharge summary for a list of discharge medications.  Relevant Imaging Results:  Relevant Lab Results:   Additional Information SSN: [id number]  Laurena Slimmer, RN

## 2022-09-24 NOTE — Progress Notes (Signed)
Advanced Heart Failure Team Consult Note   Primary Physician: Pcp, No PCP-Cardiologist:  Kathlyn [place], MD  Reason for Consultation: Heart failure  Subjective / Interval hx  -Euvolemic on exam. - No arrhythmias on telemetry - Suicidal ideation overnight.  Patient with a sitter this morning.  He states that he only had suicidal thoughts because he is very frustrated but otherwise does not plan to act out on these thoughts. Objective:    Vital Signs:   Temp:  [97.5 F (36.4 C)-100 F (37.8 C)] 98.3 F (36.8 C) (03/19 0739) Pulse Rate:  [62-82] 64 (03/19 0739) Resp:  [14-20] 14 (03/19 0739) BP: (99-128)/(59-78) 102/66 (03/19 0739) SpO2:  [92 %-100 %] 92 % (03/19 0739) Weight:  [80.2 kg] 80.2 kg (03/19 0856) Last BM Date : 09/22/22  Weight change: Filed Weights   09/22/22 0949 09/23/22 0838 09/24/22 0856  Weight: 95.6 kg 79.1 kg 80.2 kg    Intake/Output:   Intake/Output Summary (Last 24 hours) at 09/24/2022 0916 Last data filed at 09/24/2022 0100 Gross per 24 hour  Intake 1300 ml  Output 1575 ml  Net -275 ml       Physical Exam    General:  Chronically ill appearing. No resp difficulty Neck: supple.  JVP 7-8 Cor: PMI nondisplaced. Regular rate & rhythm. No rubs, gallops or murmurs. Lungs: coarse diffusely Abdomen: soft, nontender, nondistended. No hepatosplenomegaly. No bruits or masses. Good bowel sounds. Extremities: no cyanosis, clubbing, rash, No Neuro: alert & orientedx3, cranial nerves grossly intact. moves all 4 extremities w/o difficulty. Affect pleasant  EKG    Sinus BiV pacing 70s  Labs   Basic Metabolic Panel: Recent Labs  Lab 09/17/22 1043 09/19/22 1848 09/21/22 0506 09/22/22 0501 09/23/22 0534 09/23/22 1033  NA 132* 134* 134* 130* 133*  --   K 4.9 3.5 3.2* 3.7 3.5  --   CL 99 100 98 96* 94*  --   CO2 24 24 27 24 25   --   GLUCOSE 75 107* 97 95 102*  --   BUN 25* 21 23 30* 42*  --   CREATININE 2.23* 2.00* 1.68* 1.70* 1.82*  --    CALCIUM 9.2 8.6* 8.8* 8.9 9.5  --   MG  --   --   --   --   --  2.6*     Liver Function Tests: Recent Labs  Lab 09/19/22 1848  AST 30  ALT 19  ALKPHOS 68  BILITOT 0.8  PROT 7.1  ALBUMIN 3.4*    No results for input(s): "LIPASE", "AMYLASE" in the last 168 hours. Recent Labs  Lab 09/20/22 0917  AMMONIA 38*     CBC: Recent Labs  Lab 09/17/22 1043 09/19/22 1848 09/21/22 0506 09/22/22 0501 09/23/22 0534  WBC 7.7 7.8 8.6 10.4 8.1  NEUTROABS 5.3 5.4  --   --   --   HGB 13.1 11.9* 12.3* 12.7* 13.1  HCT 39.3 37.0* 37.1* 38.3* 39.7  MCV 90.8 92.5 90.7 89.9 89.4  PLT 208 162 171 189 197     Cardiac Enzymes: No results for input(s): "CKTOTAL", "CKMB", "CKMBINDEX", "TROPONINI" in the last 168 hours.  BNP: BNP (last 3 results) Recent Labs    08/06/22 0123 08/07/22 0115 09/19/22 1848  BNP 1,023.0* 846.9* 1,582.0*     ProBNP (last 3 results) No results for input(s): "PROBNP" in the last 8760 hours.   CBG: Recent Labs  Lab 09/23/22 0820 09/23/22 1208 09/23/22 1515 09/23/22 2253 09/24/22 0740  GLUCAP 101*  140* 130* 86 107*     Coagulation Studies: No results for input(s): "LABPROT", "INR" in the last 72 hours.   Imaging   DG Ankle Complete Left  Result Date: 09/23/2022 CLINICAL DATA:  Pain EXAM: LEFT ANKLE COMPLETE - 3+ VIEW COMPARISON:  None FINDINGS: No recent fracture or dislocation is seen. There are no focal lytic lesions. Scattered vascular calcifications are seen in soft tissues. IMPRESSION: No acute radiographic findings are seen in left ankle. Electronically Signed   By: Elmer Picker M.D.   On: 09/23/2022 10:33     Medications:     Current Medications:  amiodarone  400 mg Oral BID   apixaban  5 mg Oral BID   divalproex  500 mg Oral BID   escitalopram  10 mg Oral QHS   ezetimibe  10 mg Oral Daily   feeding supplement  237 mL Oral BID BM   insulin aspart  0-5 Units Subcutaneous QHS   insulin aspart  0-9 Units Subcutaneous TID  WC   levETIRAcetam  250 mg Oral BID   levothyroxine  50 mcg Oral Q0600   metolazone  2.5 mg Oral Daily   metoprolol succinate  12.5 mg Oral QHS   midodrine  2.5 mg Oral TID with meals   multivitamin with minerals  1 tablet Oral Daily   nicotine  14 mg Transdermal Daily   potassium chloride  40 mEq Oral Daily   QUEtiapine  200 mg Oral QHS   rosuvastatin  10 mg Oral QHS   sodium chloride flush  3 mL Intravenous Q12H    Infusions:    Assessment/Plan     1.  VT/VF arrest with ICD shock: - being managed by EP and gen cards - continue amio and mexilitene - - Kep K > 4.0 Mg > 2.0  2.  Acute on  Chronic heart failure with reduced ejection fraction/ischemic cardiomyopathy:  - EF 25 to 30% by echo in August 2023.   - Repeat echo done today 09/20/22 EF 15-20% - Optivol reading shows fluid is up and down a lot (in setting of high volume soda intake at SNF). ReDS 46% (normal 20-35% lung water) - BP soft but currently does not appear low output - Continue midodrine 2.5mg  TID for BP support - He is nearing end-stage but currently not candidate for advanced therapies with tobacco use, immobility and poor insight -Off GDMT due to low BP - Continue palliative involvement - Repeat labs pending.  Can add on SGLT2 inhibitor pending labs.  Otherwise ready for discharge. -Torsemide 60 mg daily at discharge.  3.  Coronary artery disease/demand ischemia: Status post recent catheterization October 2023 revealing 2 of 2 patent grafts with moderate RCA in-stent restenosis and distal bifurcation disease with normal iFR.  Patient denies experiencing chest pain recently.  - no evidence ACS hstrop 62 -> 64 - continue medical management   4.  Paroxysmal atrial fibrillation: Maintaining sinus rhythm.  Anticoagulated with Eliquis.  Now on Amio in the setting of VT/VF  5.  Ongoing tobacco abuse/COPD: Cessation advised but he is not interested   6.  Bipolar disorder: Per medicine team.  7. CKD 3b - SCr  at baseline 1.9-2.0 - labs pending this morning  Length of Stay: [place], DO  09/24/2022, 9:16 AM  Advanced Heart Failure Team Pager [phone] (M-F; 7a - 5p)  Please contact [facility] for night-coverage after hours (4p -7a ) and weekends on [email or web address]

## 2022-09-24 NOTE — TOC Benefit Eligibility Note (Signed)
Patient Teacher, English as a foreign language completed.    The patient is currently admitted and upon discharge could be taking Farxiga 10 mg.  The current 30 day co-pay is $4.00.   The patient is currently admitted and upon discharge could be taking Jardiance 10 mg.  Requires Prior Authorization  The patient is insured through [employer], [place] Patient Advocate Specialist [place] Patient Advocate Team Direct Number: [phone]  Fax: [phone]

## 2022-09-24 NOTE — Progress Notes (Signed)
                                                     Palliative Care Progress Note, Assessment & Plan   Patient Name: Carl [name]       Date: 09/24/2022 DOB: September 11, 1960  Age: 62 y.o. MRN#: [id number] Attending Physician: Max Sane, MD Primary Care Physician: Pcp, No Admit Date: 09/19/2022  Subjective: Patient is lying in bed, asleep, in no apparent distress.  He is easily arousable and returns to sleep quickly.  His one-to-one sitter is at bedside.  No family or friends at bedside presently.  HPI: 62 y.o. male  with past medical history of coronary artery disease, CABG 2017, ischemic cardiomyopathy ejection fraction 25 to 30%, ICD, paroxysmal atrial fibrillation on Eliquis, history of stroke, bipolar disorder, smoke  admitted on 09/19/2022 with progressive SOB and ICD discharge.    In ED, patient's AICD was interrogated, which revealed VT that degenerated to VF resulting in ICD shock of which the patient was unaware.   PMT was consulted to discuss [place].   Pt is familiar to me as I saw patient during September 2023 hospitalization  Summary of counseling/coordination of care: After reviewing the patient's chart and assessing the patient at bedside, I spoke with nurse tech in regards to patient's symptom management.  She endorses patient has been compliant during her time with him this morning.  Patient had mild complaints about his breakfast but overall has had no significant physical complaints.  Discharge summary is in place.  Patient plans to be [facility] with [facility] to [facility].  DNR remains.  No acute palliative needs today.  Please re-engage with palliative team if patient's goals change or if patient's health deteriorates during hospitalization.  Physical Exam Vitals reviewed.  Constitutional:      General: He is not in  acute distress.    Appearance: He is not ill-appearing or toxic-appearing.  HENT:     Head: Normocephalic and atraumatic.     Mouth/Throat:     Comments: Poor dentition - missing teeth Cardiovascular:     Rate and Rhythm: Normal rate.  Musculoskeletal:        General: Normal range of motion.  Skin:    General: Skin is warm and dry.  Psychiatric:        Mood and Affect: Mood is not anxious.        Behavior: Behavior is not agitated.             Total Time 25 minutes   Tinea Nobile L. Ilsa Iha, FNP-BC Palliative Medicine Team Team Phone # [phone]

## 2022-09-24 NOTE — Plan of Care (Signed)

## 2022-09-24 NOTE — Progress Notes (Signed)
Pt voiced wanting to "kill himself"- per day shift RN Kerin Ransom S and surrounding staff around est. ~1850. Pt placed on "verbal" 1:1 suicide precaution at 1900. MD Manuella Ghazi not made aware of voiced SI on days by witnessed reporter or covering RN. Night coverage NP Foust notified post psych assessment by Probation officer. NP Foust to bedside for formal assessment and pt reassessment for suicide.   Pt voiced to this RN that he feels hopeless and has no one/ nothing to live for. Pt expressed frustration to RN regarding lack of control with his health and current living situation at the facility and here. Pt st "I feel like I'm in a prison, its garbage. I want to slit my F+++ing throat". When asked if pt feels this way currently pt reported "no". Pt denies AVH/ but says "I can't control these thoughts when this comes it just happens"./ Pt unable to enter verbal contract for safety at this time but demonstrates passive SI that is circumstantially based. Pt encouraged to voice concerns to staff as promptly as possible when there is a need- Pt agree ' d / Vivien Rota NT placed as initial 1;1 sitter for safety & suicide precaution/ then [facility] Parents NT took over for sitter case at 12: Orders for 1:1 sitter continued per NP Foust and [facility] Cheryl RN until pt can be re-evaluated by psychiatrist on days.    Pt calm at this time and interacts with staff when prompted. Pt's emotions appear labile when discussing SI thought process. Pt denies an active plan at this time. Pt unable to stay on track when asked direct questions and thoughts appear to be tangential in nature. Pt asked alert and oriented questions: month , year, situation, person and place. Pt states "its march/ unable to tell correct year; pt is unsure as to his reasoning for being here; pt can identify self; pt cannot determine the building (hospital) in which he sits but is aware of nurse and doctor presence and assistance. When question of location was re-framed with noted information  above pt still could not determine the type of building he is in.

## 2022-09-24 NOTE — TOC Transition Note (Addendum)
Transition of Care [facility]) - CM/SW Discharge Note   Patient Details  Name: Carl [name] MRN: [id number] Date of Birth: [birth year]  [facility] CM/SW Contact:  Laurena Slimmer, RN Phone Number: 09/24/2022, 11:29 AM   Clinical Narrative:    Patient discharge order received.  Attempt to contact [name] Gobbel from [facility] DSS. No answer. Left a message regarding discharge today. Attempt to contact [name] with [facility] DSS. No answer. Left a message regarding discharge today Attempt to contact Toula Moos. No answer. Left a message regarding discharge today Spoke with Melida Quitter from [facility]. He will transport patient home.  Discharge summary and FL2 faxed to [name] [phone].  Discharge summary and FL2 emailed to Toula Moos and [name]  [facility] signing off    Barriers to Discharge: Continued Medical Work up   Patient Goals and CMS Choice      Discharge Placement                         Discharge Plan and Services Additional resources added to the After Visit Summary for       Post Acute Care Choice: NA                               Social Determinants of Health (SDOH) Interventions SDOH Screenings   Food Insecurity: No Food Insecurity (09/20/2022)  Housing: [place]  (09/20/2022)  Transportation Needs: No Transportation Needs (09/20/2022)  Utilities: Not At Risk (09/20/2022)  Alcohol Screen: Low Risk  (05/15/2019)  Tobacco Use: High Risk (09/19/2022)     Readmission Risk Interventions    08/05/2022    8:36 AM 03/15/2022    2:44 PM  Readmission Risk Prevention Plan  Transportation Screening Complete   HRI or [place]  Complete  Social Work Consult for [place] Planning/Counseling  Complete  Palliative Care Screening  Not Applicable  Medication Review Press photographer) Complete Complete  PCP or Specialist appointment within 3-5 days of discharge Complete   HRI or [place] Complete   [place] Not Applicable

## 2022-09-24 NOTE — Discharge Summary (Signed)
Physician Discharge Summary   Patient: Carl [name] MRN: [id number] DOB: December 28, 1960  Admit date:     09/19/2022  Discharge date: 09/24/22  Discharge Physician: Max Sane   PCP: Pcp, No   Recommendations at discharge:    Hosp[facility] at [place] [facility] F/up with outpt providers as requested  Discharge Diagnoses: Principal Problem:   AICD discharge Active Problems:   Ventricular tachycardia (HCC)   Acute on chronic combined systolic and diastolic CHF (congestive heart failure) (HCC)   Chest pain   Demand ischemia   Electrolyte abnormality   Hypothyroidism   Essential hypertension   Falls   History of alcohol abuse   History of stroke   Paroxysmal atrial fibrillation (HCC)   Type 2 diabetes mellitus with hyperglycemia (HCC)   CAD in native artery   Abdominal aortic aneurysm (AAA) 35 to 39 mm in diameter (HCC)   Tobacco use   Seizure disorder (HCC)   Acute pulmonary edema (HCC)   Goals of care, counseling/discussion  Hospital Course:  62 y.o. male with a history of CAD s/p CABG, ICM, HFrEF, VT/VF s/p CRT-D, HTN, HL, PAF, tob abuse, COPD, CKD II-III, stroke, and bipolar disorder admitted after ICD discharge   3/15: cardio c/s, Palliative care c/s, echo 3/16: Legal guardian interested in hospice evaluation.  Referral sent 3/17: Metolazone added along with potassium replacement 3/18: Added metoprolol, left ankle x-ray negative for acute pathology  Assessment and Plan: * AICD discharge Recurrent VT/VF Troponin elevation due to demand ischemia Chest pain due to AICD discharge - resolved now Device interrogated - recurrent VT Continue amio & mexiletine per cardiology Recurrent VT/VF likely in the setting of CHF exacerbation Continue amiodarone for now.  Metolazone added EF < 20% on echo this admission Overall poor prognosis.  Plan for hospice at discharge  Acute on chronic combined systolic and diastolic CHF (congestive heart failure) ([place]) in setting of high volume soda  intake at the facility EF < 20%, severe global hypokinesis on echo this admission (previous echo showed 25-30%) - EF 25 to 30% by echo in August 2023.   - BP soft but currently does not appear low output - Continue midodrine 2.5mg  TID for BP support - He is nearing end-stage but currently not candidate for advanced therapies with tobacco use, immobility and poor insight -Off GDMT due to low BP -Torsemide 60 mg daily at discharge.  Electrolyte abnormality Keep Mg > 2 and K > 4 Replaced lytes  Essential hypertension Midodrine added as BP is soft  Hypothyroidism Continue patient's home regimen of levothyroxine at 50 mcg for now. Recheck TFT as an outpt  Falls Fall precautions - likely multifactorial No skilled PT/OT need  History of alcohol abuse No withdrawal  History of stroke Continue Eliquis and crestor  Type 2 diabetes mellitus with hyperglycemia (HCC)  Paroxysmal atrial fibrillation (HCC) continue amiodarone for rate control and Eliquis for anticoagulation.   CAD in native artery Continue eliquis, crestor.   Goals of care, counseling/discussion Legal guardian agreeable and interested in hospice evaluation.  Hospice set up at discharge  Seizure disorder [facility]) Seizure precautions - continue patient on Keppra and Depakote.  Tobacco use Nicotine patch.  Abdominal aortic aneurysm (AAA) 35 to 39 mm in diameter (HCC) 3.1 cm infrarenal abdominal aortic aneurysm. Outpt monitoring  Bipolar disorder Cleared by psych  Not suicidal anymore. He was just frustrated being here and wants to leave.       Consultants: Cardio Disposition: Hospice care Diet recommendation:  Discharge Diet Orders (From  admission, onward)     Start     Ordered   09/24/22 0000  Diet - low sodium heart healthy        09/24/22 1050           Carb modified diet DISCHARGE MEDICATION: Allergies as of 09/24/2022       Reactions   Iodinated Contrast Media Shortness Of Breath    Penicillins Anaphylaxis, Shortness Of Breath   Respiratory  Tolerated cefuroxime on 01/06/16   Strawberry Extract Anaphylaxis   Cefepime Itching   Empiric antibiotic, developed pruritis.    Erythromycin Itching   Sulfa Antibiotics Itching, Nausea And Vomiting, Nausea Only        Medication List     STOP taking these medications    dapagliflozin propanediol 10 MG Tabs tablet Commonly known as: FARXIGA   furosemide 20 MG tablet Commonly known as: LASIX       TAKE these medications    acetaminophen 500 MG tablet Commonly known as: TYLENOL Take 500 mg by mouth every 6 (six) hours as needed for mild pain.   albuterol 108 (90 Base) MCG/ACT inhaler Commonly known as: VENTOLIN HFA Inhale 2 puffs into the lungs every 6 (six) hours as needed for wheezing.   albuterol (2.5 MG/3ML) 0.083% nebulizer solution Commonly known as: PROVENTIL Take 3 mLs (2.5 mg total) by nebulization every 6 (six) hours as needed for wheezing or shortness of breath.   amiodarone 400 MG tablet Commonly known as: PACERONE Take 400 mg by mouth daily.   Chloraseptic 1.4 % Liqd Generic drug: phenol Use as directed 1 spray in the mouth or throat as needed.   divalproex 500 MG 24 hr tablet Commonly known as: DEPAKOTE ER Take 1 tablet (500 mg total) by mouth 2 (two) times daily.   Eliquis 5 MG Tabs tablet Generic drug: apixaban TAKE 1 TABLET BY MOUTH TWICE A DAY   escitalopram 10 MG tablet Commonly known as: LEXAPRO Take 1 tablet (10 mg total) by mouth at bedtime.   ezetimibe 10 MG tablet Commonly known as: ZETIA Take 1 tablet (10 mg total) by mouth daily.   HYDROcodone-acetaminophen 5-325 MG tablet Commonly known as: NORCO/VICODIN Take 1 tablet by mouth every 6 (six) hours as needed for severe pain.   Hydrocortisone Acetate 1 % Crea Apply 1 Application topically 3 (three) times daily as needed (itching).   hydrOXYzine 10 MG tablet Commonly known as: ATARAX Take 10 mg by mouth 2 (two)  times daily.   isosorbide mononitrate 30 MG 24 hr tablet Commonly known as: IMDUR Take 0.5 tablets (15 mg total) by mouth daily.   levETIRAcetam 250 MG tablet Commonly known as: Keppra Take 1 tablet (250 mg total) by mouth 2 (two) times daily.   levothyroxine 50 MCG tablet Commonly known as: SYNTHROID Take 50 mcg by mouth every morning.   losartan 25 MG tablet Commonly known as: COZAAR Take 12.5 mg by mouth daily.   metoprolol succinate 25 MG 24 hr tablet Commonly known as: TOPROL-XL Take 0.5 tablets (12.5 mg total) by mouth at bedtime.   midodrine 2.5 MG tablet Commonly known as: PROAMATINE Take 1 tablet (2.5 mg total) by mouth with breakfast, with lunch, and with evening meal.   multivitamin with minerals Tabs tablet Take 1 tablet by mouth daily.   nitroGLYCERIN 0.4 MG SL tablet Commonly known as: NITROSTAT Place 1 tablet (0.4 mg total) under the tongue every 5 (five) minutes as needed for chest pain.   omeprazole 20 MG capsule  Commonly known as: PRILOSEC Take 20 mg by mouth daily.   potassium chloride 10 MEQ tablet Commonly known as: KLOR-CON M Take 4 tablets (40 mEq total) by mouth daily.   QUEtiapine 200 MG tablet Commonly known as: SEROQUEL Take 200 mg by mouth at bedtime.   rosuvastatin 10 MG tablet Commonly known as: CRESTOR Take 1 tablet (10 mg total) by mouth daily. What changed: when to take this   spironolactone 25 MG tablet Commonly known as: ALDACTONE Take 1 tablet (25 mg total) by mouth daily.   tiotropium 18 MCG inhalation capsule Commonly known as: SPIRIVA Place 18 mcg into inhaler and inhale daily.   Torsemide 60 MG Tabs Take 60 mg by mouth daily.   traMADol 50 MG tablet Commonly known as: ULTRAM Take 50 mg by mouth every 12 (twelve) hours as needed for moderate pain.        Follow-up Information     Gollan, Kathlene November, MD. Schedule an appointment as soon as possible for a visit in 2 week(s).   Specialty: Cardiology Why: [facility] Discharge F/UP Contact information: [address] [place] [address] [phone]         Barrett Henle, MD Follow up in 1 week(s).   Specialties: Family Medicine, Sports Medicine Why: [facility] Discharge F/UP Contact information: [address] [place] [address] [phone]         Deboraha Sprang, MD. Schedule an appointment as soon as possible for a visit in 2 week(s).   Specialty: Cardiology Why: [facility] Discharge F/UP Contact information: [place] [address] [phone]                Discharge Exam: Danley Danker Weights   09/22/22 [id number] 09/23/22 0838 09/24/22 0856  Weight: 95.6 kg 79.1 kg 8.54 kg   62 year old male lying in the bed looking chronically ill Lungs CTA b/l Heart: Regular rate and rhythm Abdomen: Soft, benign Skin no rash or lesion Neuro alert and awake, nonfocal  Condition at discharge: poor  The results of significant diagnostics from this hospitalization (including imaging, microbiology, ancillary and laboratory) are listed below for reference.   Imaging Studies: DG Ankle Complete Left  Result Date: 09/23/2022 CLINICAL DATA:  Pain EXAM: LEFT ANKLE COMPLETE - 3+ VIEW COMPARISON:  None FINDINGS: No recent fracture or dislocation is seen. There are no focal lytic lesions. Scattered vascular calcifications are seen in soft tissues. IMPRESSION: No acute radiographic findings are seen in left ankle. Electronically Signed   By: Elmer Picker M.D.   On: 09/23/2022 10:33   ECHOCARDIOGRAM LIMITED  Result Date: 09/20/2022    ECHOCARDIOGRAM LIMITED REPORT   [name] Date of Exam: 09/20/2022 Medical Rec #:  [id number]    Height:       66.0 in Accession #:    [id number]   Weight:       211.2 lb Date of Birth:  1961-06-22    BSA:          2.047 m Patient Age:    62 years     BP:           Not listed in chart not listed in  chart/Not listed in chart mmHg Patient Gender: M            HR:           65 bpm. Exam Location:  [facility] Procedure: Cardiac Doppler, Color Doppler and Limited Echo Indications:     Ventricular tachycardia I47.2  History:         Patient has prior history of Echocardiogram examinations, most                  recent 08/04/2022. Ischemic cardiomyopathy; Risk                  Factors:Hypertension.  Sonographer:     Sherrie Sport Referring Phys:  [place] Phys: Ida Rogue MD IMPRESSIONS  1. Left ventricular ejection fraction, by estimation, is <20%. Left ventricular ejection fraction by 2D MOD biplane is 17.5 %. The left ventricle has severely decreased function. The left ventricle demonstrates global hypokinesis, anterior/anteroseptal wall best preserved. The left ventricular internal cavity size was moderately to severely dilated.  2. Right ventricular systolic function is low normal. The right ventricular size is mildly enlarged. There is mildly elevated pulmonary artery systolic pressure. The estimated right ventricular systolic pressure is Q000111Q mmHg.  3. Left atrial size was moderately dilated.  4. The mitral valve is degenerative. Moderate to severe mitral valve regurgitation. Moderate mitral annular calcification. Gradient not measured, unable to exclude mild stenosis.  5. The aortic valve has an indeterminant number of cusps. Aortic valve regurgitation is not visualized. No aortic stenosis is present. FINDINGS  Left Ventricle: Left ventricular ejection fraction, by estimation, is <20%. Left ventricular ejection fraction by 2D MOD biplane is 17.5 %. The left ventricle has severely decreased function. The left ventricle demonstrates global hypokinesis. The left ventricular internal cavity size was moderately to severely dilated. There is no left ventricular hypertrophy. Right Ventricle: The right ventricular size is mildly enlarged. No increase in right ventricular wall thickness.  Right ventricular systolic function is low normal. There is mildly elevated pulmonary artery systolic pressure. The tricuspid regurgitant velocity is 2.76 m/s, and with an assumed right atrial pressure of 10 mmHg, the estimated right ventricular systolic pressure is Q000111Q mmHg. Left Atrium: Left atrial size was moderately dilated. Right Atrium: Right atrial size was normal in size. Pericardium: There is no evidence of pericardial effusion. Mitral Valve: The mitral valve is degenerative in appearance. There is mild calcification of the mitral valve leaflet(s). Moderate mitral annular calcification. Moderate to severe mitral valve regurgitation. Tricuspid Valve: The tricuspid valve is normal in structure. Tricuspid valve regurgitation is mild . No evidence of tricuspid stenosis. Aortic Valve: The aortic valve has an indeterminant number of cusps. Aortic valve regurgitation is not visualized. No aortic stenosis is present. Pulmonic Valve: The pulmonic valve was normal in structure. Pulmonic valve regurgitation is not visualized. No evidence of pulmonic stenosis. Aorta: The aortic root is normal in size and structure. Venous: The inferior vena cava was not well visualized. IAS/Shunts: No atrial level shunt detected by color flow Doppler. Additional Comments: A device lead is visualized. Spectral Doppler performed. Color Doppler performed.  LEFT VENTRICLE PLAX 2D                        Biplane EF (MOD) LVIDd:         7.20 cm         LV Biplane EF:   Left LVIDs:  6.20 cm                          ventricular LV PW:         0.90 cm                          ejection LV IVS:        1.20 cm                          fraction by LVOT diam:     2.20 cm                          2D MOD LVOT Area:     3.80 cm                         biplane is                                                 17.5 %.  LV Volumes (MOD) LV vol d, MOD    241.0 ml A2C: LV vol d, MOD    235.0 ml A4C: LV vol s, MOD    191.0 ml A2C: LV vol s, MOD     205.0 ml A4C: LV SV MOD A2C:   50.0 ml LV SV MOD A4C:   235.0 ml LV SV MOD BP:    42.3 ml RIGHT VENTRICLE RV Basal diam:  3.80 cm RV Mid diam:    2.10 cm RV S prime:     8.16 cm/s TAPSE (M-mode): 1.8 cm LEFT ATRIUM             Index        RIGHT ATRIUM           Index LA diam:        6.00 cm 2.93 cm/m   RA Area:     18.70 cm LA Vol (A2C):   78.7 ml 38.45 ml/m  RA Volume:   53.60 ml  26.19 ml/m LA Vol (A4C):   88.9 ml 43.44 ml/m LA Biplane Vol: 86.0 ml 42.02 ml/m   AORTA Ao Root diam: 3.50 cm MR PISA:        18.16 cm  TRICUSPID VALVE MR PISA Radius: 1.70 cm    TR Peak grad:   30.5 mmHg                            TR Vmax:        276.00 cm/s                             SHUNTS                            Systemic Diam: 2.20 cm Ida Rogue MD Electronically signed by Ida Rogue MD Signature Date/Time: 09/20/2022/11:43:11 AM    Final (Updated)    [place] ASCITES (ABDOMEN LIMITED)  Result Date: 09/20/2022 CLINICAL DATA:  Abdominal distention. EXAM: LIMITED ABDOMEN ULTRASOUND FOR ASCITES TECHNIQUE: Limited ultrasound survey for ascites was performed in all  four abdominal quadrants. COMPARISON:  CT abdomen pelvis dated March 14, 2022. FINDINGS: No free fluid noted in the abdomen. IMPRESSION: 1. No ascites. Electronically Signed   By: Titus Dubin M.D.   On: 09/20/2022 10:53   DG Chest Portable 1 View  Result Date: 09/19/2022 CLINICAL DATA:  Pacemaker discharged EXAM: PORTABLE CHEST 1 VIEW COMPARISON:  08/05/2022 FINDINGS: Stable cardiomegaly.  Aortic atherosclerotic calcification. Left chest wall CRT-D. Sternotomy. Cardiac valve prosthesis. Postoperative change of clavicle. Pulmonary vascular congestion. No focal consolidation, pleural effusion, or pneumothorax. IMPRESSION: Cardiomegaly and pulmonary vascular congestion. No change from 08/05/2022. Electronically Signed   By: Placido Sou M.D.   On: 09/19/2022 19:59   CUP PACEART INCLINIC DEVICE CHECK  Result Date: 09/10/2022 CRT-D device check in  office. Thresholds and sensing consistent with previous device measurements. Lead impedance trends stable over time. No mode switch episodes recorded. No treated ventricular arrhythmia episodes recorded post hospitalization for VT storm. Patient bi-ventricularly pacing 99% of the time. Device programmed with appropriate safety margins. Heart failure diagnostics reviewed and trends are stable for patient. No changes made this session. Estimated longevity 4.3 years.  Patient enrolled in remote follow up. Plan to check device remotely in 3 months. Patient education completed including shock plan.Myrtie Hawk, BSN, RN   Microbiology: Results for orders placed or performed during the hospital encounter of 03/22/22  Culture, blood (routine x 2)     Status: None   Collection Time: 03/22/22 10:38 AM   Specimen: BLOOD  Result Value Ref Range Status   Specimen Description BLOOD LEFT ANTECUBITAL  Final   Special Requests   Final    BOTTLES DRAWN AEROBIC AND ANAEROBIC Blood Culture results may not be optimal due to an excessive volume of blood received in culture bottles   Culture   Final    NO GROWTH 8 DAYS Performed at [facility], [address] [place], [place] [address]    Report Status 03/30/2022 FINAL  Final  SARS Coronavirus 2 by RT PCR (hospital order, performed in [facility] hospital lab) *cepheid single result test* Anterior Nasal Swab     Status: Abnormal   Collection Time: 03/22/22  4:55 PM   Specimen: Anterior Nasal Swab  Result Value Ref Range Status   SARS Coronavirus 2 by RT PCR POSITIVE (A) NEGATIVE Final    Comment: (NOTE) SARS-CoV-2 target nucleic acids are DETECTED  SARS-CoV-2 RNA is generally detectable in upper respiratory specimens  during the acute phase of infection.  Positive results are indicative  of the presence of the identified virus, but do not rule out bacterial infection or co-infection with other pathogens not detected by the test.  Clinical correlation  with patient history and  other diagnostic information is necessary to determine patient infection status.  The expected result is negative.  Fact Sheet for Patients:   https://www.patel.info/   Fact Sheet for Healthcare Providers:   https://hall.com/    This test is not yet approved or cleared by the [place] FDA and  has been authorized for detection and/or diagnosis of SARS-CoV-2 by FDA under an Emergency Use Authorization (EUA).  This EUA will remain in effect (meaning this test can be used) for the duration of  the COVID-19 declaration under Section 564(b)(1)  of the Act, 21 U.S.C. section 360-bbb-3(b)(1), unless the authorization is terminated or revoked sooner.   Performed at [facility], [address] [place], [place] [address]   Culture, blood (routine x 2)     Status:  None   Collection Time: 03/23/22  4:58 AM   Specimen: BLOOD  Result Value Ref Range Status   Specimen Description BLOOD BLOOD RIGHT HAND  Final   Special Requests   Final    BOTTLES DRAWN AEROBIC AND ANAEROBIC Blood Culture adequate volume   Culture   Final    NO GROWTH 5 DAYS Performed at [facility], [address] [place], [place] [address]    Report Status 03/28/2022 FINAL  Final    Labs: CBC: Recent Labs  Lab 09/19/22 1848 09/21/22 0506 09/22/22 0501 09/23/22 0534  WBC 7.8 8.6 10.4 8.1  NEUTROABS 5.4  --   --   --   HGB 11.9* 12.3* 12.7* 13.1  HCT 37.0* 37.1* 38.3* 39.7  MCV 92.5 90.7 89.9 89.4  PLT 162 171 189 [id number]   Basic Metabolic Panel: Recent Labs  Lab 09/19/22 1848 09/21/22 0506 09/22/22 0501 09/23/22 0534 09/23/22 1033 09/24/22 0939  NA 134* 134* 130* 133*  --  129*  K 3.5 3.2* 3.7 3.5  --  3.6  CL 100 98 96* 94*  --  92*  CO2 24 27 24 25   --  24  GLUCOSE 107* 97 95 102*  --  165*  BUN 21 23 30* 42*  --  52*  CREATININE 2.00* 1.68* 1.70* 1.82*  --  2.03*  CALCIUM 8.6* 8.8* 8.9 9.5  --  9.5  MG   --   --   --   --  2.6*  --    Liver Function Tests: Recent Labs  Lab 09/19/22 1848  AST 30  ALT 19  ALKPHOS 68  BILITOT 0.8  PROT 7.1  ALBUMIN 3.4*   CBG: Recent Labs  Lab 09/23/22 0820 09/23/22 1208 09/23/22 1515 09/23/22 2253 09/24/22 0740  GLUCAP 101* 140* 130* 86 107*    Discharge time spent: greater than 30 minutes.  Signed: Max Sane, MD [facility] 09/24/2022

## 2022-09-24 NOTE — Consult Note (Addendum)
[facility] Consult   Reason for Consult:  expression of SI Referring Physician:  Manuella Ghazi Patient Identification: Carl [name] MRN:  [id number] Principal Diagnosis: Bipolar 1 disorder ([place]) Diagnosis:  Principal Problem:   Bipolar 1 disorder ([place]) Active Problems:   CAD in native artery   History of stroke   Hypothyroidism   Acute on chronic combined systolic and diastolic CHF (congestive heart failure) ([place])   Falls   History of alcohol abuse   Paroxysmal atrial fibrillation ([place])   Abdominal aortic aneurysm (AAA) 35 to 39 mm in diameter (HCC)   Tobacco use   Seizure disorder ([place])   Chest pain   Type 2 diabetes mellitus with hyperglycemia ([place])   Demand ischemia   Essential hypertension   Ventricular tachycardia ([place])   AICD discharge   Electrolyte abnormality   Acute pulmonary edema (HCC)   Goals of care, counseling/discussion   Total Time spent with patient: 45 minutes  Subjective: "I just felt aggravated by a nurse."    Carl [name] is a 62 y.o. male patient admitted with AICD discharge. [name]  HPI: Patient seen as a consult on the medical floor, as he apparently made a reference to having SI to staff.  Chart was reviewed.  Patient has bipolar 1 disorder.  He is on Seroquel, Depakote, and Lexapro.   On evaluation, patient is sitting in bed with sitter at bedside. He is neat. Writer introduced herself; patient expressed understanding of my visit. He is alert and oriented to self, place.  He is unable to tell me exactly what brought him to the hospital, but he says he knows he is here in the hospital.  Patient is pleasant and cooperative.  He denies any thought or intent of suicide. He showed me a small scar on his left wrist, saying that he cut himself with something sharp a few years ago" when a nurse made me mad." He appears to trouble expressing himself at times, and verbalizes "I know what I want to say but can't always say it." He reports that this is  frustrating and when he gets frustrated he says things like he wants to kill himself, but  it is "A line of bullshit."  Patient may have a low frustration tolerance. Patient has a guardian and lives in [place].  He says he says stuff about hurting himself  "when I get frustrated with people." He also says he "he does it for a joke." Patient denies alcohol or illicit substance use. He reports he used to drink too much alcohol, but stopped drinking years ago.   Discussion with patient regarding saying things that make staff concerned about his safety. Patient smiled and stated he would try not to say things he doesn't mean. He says he is "OK now." He says he wants to be able to talk to his sister when he gets discharged. Again, he states that he has no thoughts of harming himself. Behavior is calm and cooperative.   Patient does not expresses any self-harm or suicidal thoughts at this time. He does not meet criteria for inpatient psychiatric hospitalization.  Past Psychiatric History: Bipolar 1 disorder  Risk to Self:   Risk to Others:   Prior Inpatient Therapy:   Prior Outpatient Therapy:    Past Medical History:  Past Medical History:  Diagnosis Date   Acute pulmonary edema ([place]) 2017   Acute respiratory failure with hypoxia ([place]) 2017   Bipolar 1 disorder (HCC)    CAD (coronary artery disease)  a. Prior RCA stenting; b. 2017 s/p CABG x 2 (LIMA->D1, VG->OM2); b. 04/2022 Demand Isch/Cath: LM mild dzs, LAD 3m, D1 100, LCX mod diff dzs, OM2 100, OM3 50, RCA 40/20p ISR, 85d (iFR 0.99), 50d, RPAV 60, LIMA->D1 small, patent, VG->OM2 large, mild dzs-->Med Rx.   Chronic HFrEF (heart failure with reduced ejection fraction) ([place])    a. 02/2022 Echo: EF 25-30%, mild conc LVH, nl RV fxn, sev dil LA, mildly dil RA, mild MR/AI.   CKD (chronic kidney disease), stage III (HCC)    COPD (chronic obstructive pulmonary disease) ([place])    Depression    Hypertension    Ischemic cardiomyopathy    a. 04/2020 s/p  MDT MRI compatible CRT-D (Ser # [id number]); b. 02/2022 Echo: EF 25-30%.   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   Seizures ([place])    childhood   Stroke [facility])    Tobacco abuse     Past Surgical History:  Procedure Laterality Date   BIV UPGRADE N/A 05/05/2020   Procedure: BIV ICD UPGRADE;  Surgeon: Deboraha Sprang, MD;  Location: [facility];  Service: Cardiovascular;  Laterality: N/A;   CORONARY ARTERY BYPASS GRAFT     INTRAVASCULAR PRESSURE WIRE/FFR STUDY N/A 04/25/2022   Procedure: INTRAVASCULAR PRESSURE WIRE/FFR STUDY;  Surgeon: Nelva Bush, MD;  Location: [facility];  Service: Cardiovascular;  Laterality: N/A;   RIGHT/LEFT HEART CATH AND CORONARY/GRAFT ANGIOGRAPHY N/A 04/25/2022   Procedure: RIGHT/LEFT HEART CATH AND CORONARY/GRAFT ANGIOGRAPHY;  Surgeon: Nelva Bush, MD;  Location: [facility];  Service: Cardiovascular;  Laterality: N/A;   Family History:  Family History  Problem Relation Age of Onset   Cancer Mother        "femal cancer"   Prostate cancer Father    Family Psychiatric  History: unknown Social History:  Social History   Substance and Sexual Activity  Alcohol Use Not Currently     Social History   Substance and Sexual Activity  Drug Use Never    Social History   Socioeconomic History   Marital status: Widowed    Spouse name: Not on file   Number of children: Not on file   Years of education: Not on file   Highest education level: Not on file  Occupational History   Not on file  Tobacco Use   Smoking status: Every Day    Packs/day: 0.25    Years: 0.00    Additional pack years: 0.00    Total pack years: 0.00    Types: Cigarettes   Smokeless tobacco: Never  Vaping Use   Vaping Use: Never used  Substance and Sexual Activity   Alcohol use: Not Currently   Drug use: Never   Sexual activity: Not  Currently    Birth control/protection: Abstinence  Other Topics Concern   Not on file  Social History Narrative   Not on file   Social Determinants of Health   Financial Resource Strain: Not on file  Food Insecurity: No Food Insecurity (09/20/2022)   Hunger Vital Sign    Worried About Running Out of Food in the Last Year: Never true    Ran Out of Food in the Last Year: Never true  Transportation Needs: No Transportation Needs (09/20/2022)   PRAPARE - Hydrologist (Medical): No    Lack of Transportation (Non-Medical): No  Physical Activity: Not  on file  Stress: Not on file  Social Connections: Not on file   Additional Social History:    Allergies:   Allergies  Allergen Reactions   Iodinated Contrast Media Shortness Of Breath   Penicillins Anaphylaxis and Shortness Of Breath    Respiratory  Tolerated cefuroxime on 01/06/16   Strawberry Extract Anaphylaxis   Cefepime Itching    Empiric antibiotic, developed pruritis.    Erythromycin Itching   Sulfa Antibiotics Itching, Nausea And Vomiting and Nausea Only    Labs:  Results for orders placed or performed during the hospital encounter of 09/19/22 (from the past 48 hour(s))  Glucose, capillary     Status: Abnormal   Collection Time: 09/22/22 12:24 PM  Result Value Ref Range   Glucose-Capillary 174 (H) 70 - 99 mg/dL    Comment: Glucose reference range applies only to samples taken after fasting for at least 8 hours.  Glucose, capillary     Status: Abnormal   Collection Time: 09/22/22  4:49 PM  Result Value Ref Range   Glucose-Capillary 173 (H) 70 - 99 mg/dL    Comment: Glucose reference range applies only to samples taken after fasting for at least 8 hours.  Glucose, capillary     Status: Abnormal   Collection Time: 09/22/22  8:46 PM  Result Value Ref Range   Glucose-Capillary 116 (H) 70 - 99 mg/dL    Comment: Glucose reference range applies only to samples taken after fasting for at least 8  hours.  CBC     Status: Abnormal   Collection Time: 09/23/22  5:34 AM  Result Value Ref Range   WBC 8.1 4.0 - 10.5 K/uL   RBC 4.44 4.22 - 5.81 MIL/uL   Hemoglobin 13.1 13.0 - 17.0 g/dL   HCT 39.7 39.0 - 52.0 %   MCV 89.4 80.0 - 100.0 fL   MCH 29.5 26.0 - 34.0 pg   MCHC 33.0 30.0 - 36.0 g/dL   RDW 16.4 (H) 11.5 - 15.5 %   Platelets 197 150 - 400 K/uL   nRBC 0.0 0.0 - 0.2 %    Comment: Performed at [facility], [address] [place], [place] [id number]  Basic metabolic panel     Status: Abnormal   Collection Time: 09/23/22  5:34 AM  Result Value Ref Range   Sodium 133 (L) 135 - 145 mmol/L   Potassium 3.5 3.5 - 5.1 mmol/L   Chloride 94 (L) 98 - 111 mmol/L   CO2 25 22 - 32 mmol/L   Glucose, Bld 102 (H) 70 - 99 mg/dL    Comment: Glucose reference range applies only to samples taken after fasting for at least 8 hours.   BUN 42 (H) 8 - 23 mg/dL   Creatinine, Ser 1.82 (H) 0.61 - 1.24 mg/dL   Calcium 9.5 8.9 - 10.3 mg/dL   GFR, Estimated 41 (L) >60 mL/min    Comment: (NOTE) Calculated using the CKD-EPI Creatinine Equation (2021)    Anion gap 14 5 - 15    Comment: Performed at [facility], [address] [place], [place] [address]  Glucose, capillary     Status: Abnormal   Collection Time: 09/23/22  8:20 AM  Result Value Ref Range   Glucose-Capillary 101 (H) 70 - 99 mg/dL    Comment: Glucose reference range applies only to samples taken after fasting for at least 8 hours.  Magnesium     Status: Abnormal   Collection Time: 09/23/22 10:33 AM  Result  Value Ref Range   Magnesium 2.6 (H) 1.7 - 2.4 mg/dL    Comment: Performed at [facility], [address] [place], [place] [address]  Glucose, capillary     Status: Abnormal   Collection Time: 09/23/22 12:08 PM  Result Value Ref Range   Glucose-Capillary 140 (H) 70 - 99 mg/dL    Comment: Glucose reference range applies only to samples taken after fasting for at least 8 hours.  Glucose, capillary      Status: Abnormal   Collection Time: 09/23/22  3:15 PM  Result Value Ref Range   Glucose-Capillary 130 (H) 70 - 99 mg/dL    Comment: Glucose reference range applies only to samples taken after fasting for at least 8 hours.  Glucose, capillary     Status: None   Collection Time: 09/23/22 10:53 PM  Result Value Ref Range   Glucose-Capillary 86 70 - 99 mg/dL    Comment: Glucose reference range applies only to samples taken after fasting for at least 8 hours.   Comment 1 Document in Chart   Glucose, capillary     Status: Abnormal   Collection Time: 09/24/22  7:40 AM  Result Value Ref Range   Glucose-Capillary 107 (H) 70 - 99 mg/dL    Comment: Glucose reference range applies only to samples taken after fasting for at least 8 hours.  Basic metabolic panel     Status: Abnormal   Collection Time: 09/24/22  9:39 AM  Result Value Ref Range   Sodium 129 (L) 135 - 145 mmol/L   Potassium 3.6 3.5 - 5.1 mmol/L   Chloride 92 (L) 98 - 111 mmol/L   CO2 24 22 - 32 mmol/L   Glucose, Bld 165 (H) 70 - 99 mg/dL    Comment: Glucose reference range applies only to samples taken after fasting for at least 8 hours.   BUN 52 (H) 8 - 23 mg/dL   Creatinine, Ser 2.03 (H) 0.61 - 1.24 mg/dL   Calcium 9.5 8.9 - 10.3 mg/dL   GFR, Estimated 36 (L) >60 mL/min    Comment: (NOTE) Calculated using the CKD-EPI Creatinine Equation (2021)    Anion gap 13 5 - 15    Comment: Performed at [facility], [address] [place], [place] [address]    Current Facility-Administered Medications  Medication Dose Route Frequency Provider Last Rate Last Admin   acetaminophen (TYLENOL) tablet 650 mg  650 mg Oral Q6H PRN Para Skeans, MD       Or   acetaminophen (TYLENOL) suppository 650 mg  650 mg Rectal Q6H PRN Para Skeans, MD       albuterol (PROVENTIL) (2.5 MG/3ML) 0.083% nebulizer solution 2.5 mg  2.5 mg Inhalation Q6H PRN Florina Ou V, MD   2.5 mg at 09/21/22 0151   amiodarone (PACERONE) tablet 400 mg  400 mg  Oral BID Minna Merritts, MD   400 mg at 09/24/22 0930   apixaban (ELIQUIS) tablet 5 mg  5 mg Oral BID Florina Ou V, MD   5 mg at 09/24/22 0930   divalproex (DEPAKOTE ER) 24 hr tablet 500 mg  500 mg Oral BID Florina Ou V, MD   500 mg at 09/24/22 0933   escitalopram (LEXAPRO) tablet 10 mg  10 mg Oral QHS Para Skeans, MD   10 mg at 09/23/22 2302   ezetimibe (ZETIA) tablet 10 mg  10 mg Oral Daily Para Skeans, MD   10 mg at 09/24/22 0930  feeding supplement (ENSURE ENLIVE / ENSURE PLUS) liquid 237 mL  237 mL Oral BID BM Manuella Ghazi, Vipul, MD   237 mL at 09/24/22 0929   HYDROcodone-acetaminophen (NORCO/VICODIN) 5-325 MG per tablet 1 tablet  1 tablet Oral Q4H PRN Max Sane, MD   1 tablet at 09/23/22 1407   insulin aspart (novoLOG) injection 0-5 Units  0-5 Units Subcutaneous QHS Florina Ou V, MD       insulin aspart (novoLOG) injection 0-9 Units  0-9 Units Subcutaneous TID WC Para Skeans, MD   1 Units at 09/23/22 1733   levETIRAcetam (KEPPRA) tablet 250 mg  250 mg Oral BID Para Skeans, MD   250 mg at 09/24/22 [id number]   levothyroxine (SYNTHROID) tablet 50 mcg  50 mcg Oral Q0600 Para Skeans, MD   50 mcg at 09/24/22 [id number]   metolazone (ZAROXOLYN) tablet 2.5 mg  2.5 mg Oral Daily Minna Merritts, MD   2.5 mg at 09/24/22 [id number]   metoprolol succinate (TOPROL-XL) 24 hr tablet 12.5 mg  12.5 mg Oral QHS Sabharwal, Aditya, DO   12.5 mg at 09/23/22 2301   midodrine (PROAMATINE) tablet 2.5 mg  2.5 mg Oral TID with meals Minna Merritts, MD   2.5 mg at 09/24/22 0930   multivitamin with minerals tablet 1 tablet  1 tablet Oral Daily Max Sane, MD   1 tablet at 09/24/22 0930   nicotine (NICODERM CQ - dosed in mg/24 hours) patch 14 mg  14 mg Transdermal Daily Florina Ou V, MD   14 mg at 09/24/22 0929   potassium chloride SA (KLOR-CON M) CR tablet 40 mEq  40 mEq Oral Daily Minna Merritts, MD   40 mEq at 09/24/22 0930   QUEtiapine (SEROQUEL) tablet 200 mg  200 mg Oral QHS Florina Ou V, MD   200 mg at  09/23/22 2302   rosuvastatin (CRESTOR) tablet 10 mg  10 mg Oral QHS Florina Ou V, MD   10 mg at 09/23/22 2302   sodium chloride flush (NS) 0.9 % injection 3 mL  3 mL Intravenous Q12H Para Skeans, MD   3 mL at 09/24/22 0929    Musculoskeletal: Strength & Muscle Tone:  in bed, did not assess Gait & Station:  did not observe Patient leans: N/A    Psychiatric Specialty Exam:  Presentation  General Appearance:  Appropriate for Environment  Eye Contact: Good  Speech: Clear and Coherent  Speech Volume: Normal  Handedness: Right   Mood and Affect  Mood: Euthymic  Affect: Congruent   Thought Process  Thought Processes: Coherent  Descriptions of Associations:Intact  Orientation:Partial  Thought Content:Other (comment) (may be at baseline. No unsafe thoughts expressed)  History of Schizophrenia/Schizoaffective disorder:No  Duration of Psychotic Symptoms:No data recorded Hallucinations:Hallucinations: None  Ideas of Reference:None  Suicidal Thoughts:Suicidal Thoughts: No  Homicidal Thoughts:Homicidal Thoughts: No   Sensorium  Memory: Recent Fair  Judgment: Fair  Insight: Fair   Community education officer  Concentration: Fair  Attention Span: Fair  Recall: [place] of Knowledge: Fair  Language: Fair   Psychomotor Activity  Psychomotor Activity:Psychomotor Activity: Normal   Assets  Assets: Resilience; Social Support   Sleep  Sleep:Sleep: Good   Physical Exam: Physical Exam Vitals and nursing note reviewed.  HENT:     Head: Normocephalic.     Nose: No congestion or rhinorrhea.  Eyes:     General:        Right eye: No discharge.  Left eye: No discharge.  Cardiovascular:     Rate and Rhythm: Normal rate.  Pulmonary:     Effort: Pulmonary effort is normal.  Musculoskeletal:        General: Normal range of motion.     Cervical back: Normal range of motion.  Skin:    General: Skin is dry.  Neurological:      Mental Status: He is alert.  Psychiatric:        Attention and Perception: Attention normal.        Mood and Affect: Mood normal.        Speech: Speech normal.        Behavior: Behavior normal. Behavior is cooperative.        Thought Content: Thought content normal. Thought content is not paranoid. Thought content does not include homicidal or suicidal ideation.        Cognition and Memory: Cognition is impaired.        Judgment: Judgment is impulsive.    Review of Systems  Psychiatric/Behavioral:  Negative for hallucinations, substance abuse and suicidal ideas. The patient is not nervous/anxious and does not have insomnia.        Bipolar 1 disorder   Blood pressure 102/66, pulse 64, temperature 98.3 F (36.8 C), resp. rate 14, height 5\' 6"  (1.676 m), weight 80.2 kg, SpO2 92 %. Body mass index is 28.54 kg/m.  Treatment Plan Summary: Patient does not express suicidal ideation at this time. He does not meet criteria for inpatient psychiatric hospitalization. Recommend that he continue with his psychiatric outpatient providers for mood stabilization. Reviewed with Dr Manuella Ghazi via secure chat.   Disposition: No evidence of imminent risk to self or others at present.   Patient does not meet criteria for psychiatric inpatient admission. Supportive therapy provided about ongoing stressors. Discussed crisis plan, support from social network, calling 911, coming to the Emergency Department, and calling Suicide Hotline.  Sherlon Handing, NP 09/24/2022 11:02 AM

## 2022-09-26 ENCOUNTER — Encounter: Payer: Medicaid Other | Admitting: Family

## 2022-09-27 LAB — THYROID PANEL WITH TSH
Free Thyroxine Index: 2 (ref 1.2–4.9)
T3 Uptake Ratio: 30 % (ref 24–39)
T4, Total: 6.6 ug/dL (ref 4.5–12.0)
TSH: 10.4 u[IU]/mL — ABNORMAL HIGH (ref 0.450–4.500)

## 2022-10-14 DIAGNOSIS — I4819 Other persistent atrial fibrillation: Secondary | ICD-10-CM | POA: Insufficient documentation

## 2022-10-14 DIAGNOSIS — I447 Left bundle-branch block, unspecified: Secondary | ICD-10-CM | POA: Insufficient documentation

## 2022-10-15 ENCOUNTER — Ambulatory Visit: Payer: Medicaid Other | Attending: Internal Medicine | Admitting: Internal Medicine

## 2022-10-15 ENCOUNTER — Encounter: Payer: Self-pay | Admitting: Internal Medicine

## 2022-10-15 DIAGNOSIS — I447 Left bundle-branch block, unspecified: Secondary | ICD-10-CM

## 2022-10-15 DIAGNOSIS — Z95810 Presence of automatic (implantable) cardiac defibrillator: Secondary | ICD-10-CM

## 2022-10-15 DIAGNOSIS — I4819 Other persistent atrial fibrillation: Secondary | ICD-10-CM

## 2022-10-15 DIAGNOSIS — Z4502 Encounter for adjustment and management of automatic implantable cardiac defibrillator: Secondary | ICD-10-CM

## 2022-10-15 DIAGNOSIS — I472 Ventricular tachycardia, unspecified: Secondary | ICD-10-CM

## 2022-10-15 DIAGNOSIS — I255 Ischemic cardiomyopathy: Secondary | ICD-10-CM

## 2022-10-15 DIAGNOSIS — I5022 Chronic systolic (congestive) heart failure: Secondary | ICD-10-CM

## 2022-10-25 ENCOUNTER — Ambulatory Visit: Payer: Medicaid Other | Admitting: Cardiovascular Disease

## 2022-11-01 ENCOUNTER — Ambulatory Visit (INDEPENDENT_AMBULATORY_CARE_PROVIDER_SITE_OTHER): Payer: Medicaid Other

## 2022-11-01 ENCOUNTER — Emergency Department
Admission: EM | Admit: 2022-11-01 | Discharge: 2022-11-01 | Disposition: A | Discharge status: Home / Self Care | Payer: Medicaid Other | Attending: Emergency Medicine | Admitting: Emergency Medicine

## 2022-11-01 ENCOUNTER — Emergency Department: Payer: Medicaid Other

## 2022-11-01 ENCOUNTER — Other Ambulatory Visit: Payer: Self-pay

## 2022-11-01 ENCOUNTER — Telehealth: Payer: Self-pay

## 2022-11-01 DIAGNOSIS — J449 Chronic obstructive pulmonary disease, unspecified: Secondary | ICD-10-CM | POA: Insufficient documentation

## 2022-11-01 DIAGNOSIS — I472 Ventricular tachycardia, unspecified: Secondary | ICD-10-CM | POA: Diagnosis not present

## 2022-11-01 DIAGNOSIS — Z7901 Long term (current) use of anticoagulants: Secondary | ICD-10-CM | POA: Diagnosis not present

## 2022-11-01 DIAGNOSIS — I5042 Chronic combined systolic (congestive) and diastolic (congestive) heart failure: Secondary | ICD-10-CM | POA: Diagnosis not present

## 2022-11-01 DIAGNOSIS — Z950 Presence of cardiac pacemaker: Secondary | ICD-10-CM | POA: Insufficient documentation

## 2022-11-01 DIAGNOSIS — I255 Ischemic cardiomyopathy: Secondary | ICD-10-CM

## 2022-11-01 DIAGNOSIS — N1831 Chronic kidney disease, stage 3a: Secondary | ICD-10-CM | POA: Diagnosis not present

## 2022-11-01 DIAGNOSIS — R079 Chest pain, unspecified: Secondary | ICD-10-CM | POA: Diagnosis present

## 2022-11-01 DIAGNOSIS — Z8673 Personal history of transient ischemic attack (TIA), and cerebral infarction without residual deficits: Secondary | ICD-10-CM | POA: Insufficient documentation

## 2022-11-01 DIAGNOSIS — I2510 Atherosclerotic heart disease of native coronary artery without angina pectoris: Secondary | ICD-10-CM | POA: Diagnosis not present

## 2022-11-01 DIAGNOSIS — E039 Hypothyroidism, unspecified: Secondary | ICD-10-CM | POA: Insufficient documentation

## 2022-11-01 DIAGNOSIS — I130 Hypertensive heart and chronic kidney disease with heart failure and stage 1 through stage 4 chronic kidney disease, or unspecified chronic kidney disease: Secondary | ICD-10-CM | POA: Insufficient documentation

## 2022-11-01 LAB — BASIC METABOLIC PANEL
Anion gap: 9 (ref 5–15)
BUN: 26 mg/dL — ABNORMAL HIGH (ref 8–23)
CO2: 27 mmol/L (ref 22–32)
Calcium: 9.4 mg/dL (ref 8.9–10.3)
Chloride: 96 mmol/L — ABNORMAL LOW (ref 98–111)
Creatinine, Ser: 2.3 mg/dL — ABNORMAL HIGH (ref 0.61–1.24)
GFR, Estimated: 31 mL/min — ABNORMAL LOW (ref >60)
Glucose, Bld: 80 mg/dL (ref 70–99)
Potassium: 4.1 mmol/L (ref 3.5–5.1)
Sodium: 132 mmol/L — ABNORMAL LOW (ref 135–145)

## 2022-11-01 LAB — CUP PACEART REMOTE DEVICE CHECK
Battery Remaining Longevity: 39 mo
Battery Voltage: 2.96 V
Brady Statistic AP VP Percent: 80.42 %
Brady Statistic AP VS Percent: 0.17 %
Brady Statistic AS VP Percent: 19.12 %
Brady Statistic AS VS Percent: 0.29 %
Brady Statistic RA Percent Paced: 80.3 %
Brady Statistic RV Percent Paced: 99.28 %
Date Time Interrogation Session: 20240426043623
HighPow Impedance: 81 Ohm
Implantable Lead Connection Status: 753985
Implantable Lead Connection Status: 753985
Implantable Lead Connection Status: 753985
Implantable Lead Implant Date: 20171201
Implantable Lead Implant Date: 20171201
Implantable Lead Implant Date: 20211029
Implantable Lead Location: 753858
Implantable Lead Location: 753859
Implantable Lead Location: 753860
Implantable Lead Model: 5076
Implantable Pulse Generator Implant Date: 20211029
Lead Channel Impedance Value: 204.14 Ohm
Lead Channel Impedance Value: 204.14 Ohm
Lead Channel Impedance Value: 216.848
Lead Channel Impedance Value: 222.34 Ohm
Lead Channel Impedance Value: 222.34 Ohm
Lead Channel Impedance Value: 399 Ohm
Lead Channel Impedance Value: 399 Ohm
Lead Channel Impedance Value: 418 Ohm
Lead Channel Impedance Value: 418 Ohm
Lead Channel Impedance Value: 418 Ohm
Lead Channel Impedance Value: 475 Ohm
Lead Channel Impedance Value: 513 Ohm
Lead Channel Impedance Value: 646 Ohm
Lead Channel Impedance Value: 665 Ohm
Lead Channel Impedance Value: 703 Ohm
Lead Channel Impedance Value: 760 Ohm
Lead Channel Impedance Value: 760 Ohm
Lead Channel Impedance Value: 779 Ohm
Lead Channel Pacing Threshold Amplitude: 0.625 V
Lead Channel Pacing Threshold Amplitude: 1 V
Lead Channel Pacing Threshold Amplitude: 2.5 V
Lead Channel Pacing Threshold Pulse Width: 0.4 ms
Lead Channel Pacing Threshold Pulse Width: 0.4 ms
Lead Channel Pacing Threshold Pulse Width: 0.4 ms
Lead Channel Sensing Intrinsic Amplitude: 0.375 mV
Lead Channel Sensing Intrinsic Amplitude: 0.375 mV
Lead Channel Sensing Intrinsic Amplitude: 12.375 mV
Lead Channel Sensing Intrinsic Amplitude: 12.375 mV
Lead Channel Setting Pacing Amplitude: 1.5 V
Lead Channel Setting Pacing Amplitude: 1.5 V
Lead Channel Setting Pacing Amplitude: 2.5 V
Lead Channel Setting Pacing Pulse Width: 0.4 ms
Lead Channel Setting Pacing Pulse Width: 0.8 ms
Lead Channel Setting Sensing Sensitivity: 0.3 mV
Zone Setting Status: 755011

## 2022-11-01 LAB — TROPONIN I (HIGH SENSITIVITY)
Troponin I (High Sensitivity): 43 ng/L — ABNORMAL HIGH (ref <18)
Troponin I (High Sensitivity): 31 ng/L — ABNORMAL HIGH (ref <18)

## 2022-11-01 LAB — CBC
HCT: 40.4 % (ref 39.0–52.0)
Hemoglobin: 13.3 g/dL (ref 13.0–17.0)
MCH: 29 pg (ref 26.0–34.0)
MCHC: 32.9 g/dL (ref 30.0–36.0)
MCV: 88 fL (ref 80.0–100.0)
Platelets: 142 10*3/uL — ABNORMAL LOW (ref 150–400)
RBC: 4.59 MIL/uL (ref 4.22–5.81)
RDW: 17.2 % — ABNORMAL HIGH (ref 11.5–15.5)
WBC: 5.2 10*3/uL (ref 4.0–10.5)
nRBC: 0 % (ref 0.0–0.2)

## 2022-11-01 LAB — MAGNESIUM: Magnesium: 2.1 mg/dL (ref 1.7–2.4)

## 2022-11-01 MED ORDER — AMIODARONE HCL 200 MG PO TABS
400.0000 mg | ORAL_TABLET | Freq: Every day | ORAL | Status: DC
  Administered 2022-11-01: 400 mg via ORAL
  Filled 2022-11-01: qty 2

## 2022-11-01 MED ORDER — TORSEMIDE 20 MG PO TABS
60.0000 mg | ORAL_TABLET | Freq: Every day | ORAL | Status: DC
  Administered 2022-11-01: 60 mg via ORAL
  Filled 2022-11-01: qty 3

## 2022-11-01 NOTE — Telephone Encounter (Signed)
The patient caregiver was returning nurse call. He left a voicemail for the nurse to call him at [phone].

## 2022-11-01 NOTE — Telephone Encounter (Signed)
Spoke with Doristine Mango ( pt caregiver). Doristine Mango stated that pt takes all of his medications as advised/prescribed. His shock was today at 5am.. Doristine Mango stated that per their facility protocol-every time pt gets shocked he has to call EMS for further eval. denies SOB or CP at this time.  Doristine Mango stated that last time pt was shocked pt was hospitalized for a few days. Dr. Gala Romney aware. Doristine Mango will call EMS so that pt can be evaluated per their facility protocol.

## 2022-11-01 NOTE — ED Notes (Signed)
Pts guardian states she will have someone come pick up pt.

## 2022-11-01 NOTE — Telephone Encounter (Signed)
Alert received from [employer]:  Device alert for sustained VT with therapy Events occurred 4/26 04:57-04:58, EGM shows sustained VT, rate 122, ATP delivered x1 slowing without termination, another burst of ATP delivered unsuccessfully followed by HV therapy converting to regular AP/BiV pace HF diagnostics currently abnormal  Outreach made to Pt.  Pt has a caregiver.  Per caregiver he will have to check with his team regarding any symptoms Pt may have had.  He will have team assess Pt and call this nurse back.

## 2022-11-01 NOTE — ED Notes (Signed)
Pts pacemaker interrogated  

## 2022-11-01 NOTE — ED Provider Notes (Signed)
[facility] Provider Note    Event Date/Time   First MD Initiated Contact with Patient 11/01/22 1420     (approximate)   History   Pacemaker Problem   HPI  Carl [name] is a 62 y.o. male past medical history of combined systolic diastolic heart failure with EF less than 20% status post AICD with recurrent VT VF on amiodarone and mexiletine, hypertension but now on midodrine, hypothyroidism, type 2 diabetes, paroxysmal A-fib on Eliquis and amiodarone who presents because the ICD fired.  Patient tells me that he was shocked by the ICD around 7 AM.  He was walking to use the urinal.  Said he did not actually fall.  Patient has difficulty providing much history.  Says he was having some chest pain around the time cannot tell me it was before or after the shock.  Otherwise he is denying complaints currently.  Tells me that he has "heart issues" but does not have good insight into his disease.  I spoke with patient's legal guardian with DSS Candace he tells me that last time patient was admitted plan was for him to be on hospice.  However when hospice came out to evaluate him they said he did not meet criteria so the plan was for him to get palliative care but this has not yet been arranged.  Patient does not make his own decisions.  Does not have close family member.     Past Medical History:  Diagnosis Date   Acute pulmonary edema (HCC) 2017   Acute respiratory failure with hypoxia (HCC) 2017   Bipolar 1 disorder (HCC)    CAD (coronary artery disease)    a. Prior RCA stenting; b. 2017 s/p CABG x 2 (LIMA->D1, VG->OM2); b. 04/2022 Demand Isch/Cath: LM mild dzs, LAD 56m, D1 100, LCX mod diff dzs, OM2 100, OM3 50, RCA 40/20p ISR, 85d (iFR 0.99), 50d, RPAV 60, LIMA->D1 small, patent, VG->OM2 large, mild dzs-->Med Rx.   Chronic HFrEF (heart failure with reduced ejection fraction) (HCC)    a. 02/2022 Echo: EF 25-30%, mild conc LVH, nl RV fxn, sev dil LA, mildly dil RA, mild  MR/AI.   CKD (chronic kidney disease), stage III (HCC)    COPD (chronic obstructive pulmonary disease) (HCC)    Depression    Hypertension    Ischemic cardiomyopathy    a. 04/2020 s/p MDT MRI compatible CRT-D (Ser # [id number]); b. 02/2022 Echo: EF 25-30%.   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   Seizures (HCC)    childhood   Stroke [facility])    Tobacco abuse     Patient Active Problem List   Diagnosis Date Noted   Persistent atrial fibrillation (HCC) 10/14/2022   LBBB (left bundle branch block) 10/14/2022   Goals of care, counseling/discussion 09/21/2022   Electrolyte abnormality 09/20/2022   Acute pulmonary edema (HCC) 09/20/2022   AICD discharge 04/27/2022   Non-ST elevation (NSTEMI) myocardial infarction [facility])    Ventricular fibrillation (HCC)    Ventricular tachycardia (HCC) 04/23/2022   Acute respiratory failure with hypoxia (HCC) 04/22/2022   Hypokalemia 04/22/2022   Demand ischemia    Type 2 diabetes mellitus with hyperglycemia (HCC) 03/25/2022   Overweight (BMI 25.0-29.9) 03/23/2022   COVID-19 virus infection 03/23/2022   Respiratory failure (HCC) 03/22/2022   Dilated cardiomyopathy (HCC)    Chest pain    COPD exacerbation (HCC) 03/14/2022  Seizure disorder (HCC) 03/14/2022   Dyspnea    Congestive heart failure (HCC) 02/15/2022   Anxiety 02/15/2022   Tobacco use 02/15/2022   Elevated partial thromboplastin time (PTT) 02/15/2022   Abdominal pain 02/15/2022   HFrEF (heart failure with reduced ejection fraction) (HCC)    Weakness    COPD with acute exacerbation (HCC) 05/23/2021   Paroxysmal atrial fibrillation (HCC) 05/23/2021   Chronic anticoagulation 05/23/2021   Abdominal aortic aneurysm (AAA) 35 to 39 mm in diameter (HCC) 05/23/2021   Ischemic cardiomyopathy 05/22/2020   Chronic combined systolic and diastolic heart failure (HCC) 05/22/2020   HCAP  (healthcare-associated pneumonia) 01/24/2020   Hyperkalemia 01/24/2020   Acute on chronic combined systolic and diastolic CHF (congestive heart failure) (HCC) 01/24/2020   History of seizure 01/24/2020   HLD (hyperlipidemia) 01/24/2020   Ascites due to alcoholic cirrhosis (HCC)    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/18/2019   Elevated troponin 11/18/2019   Hypoxia 11/18/2019   Acute renal failure superimposed on stage 3a chronic kidney disease (HCC) 11/18/2019   CAD in native artery 05/15/2019   History of stroke 05/15/2019   Homicidal ideation 05/15/2019   Hypothyroidism 05/15/2019   Constipation 05/06/2019   Scrotal mass 05/06/2019   Abnormal urine odor 05/06/2019   Bipolar 1 disorder (HCC) 12/23/2018   Chronic pain of left knee 09/17/2018   Atypical chest pain 10/11/2017   Polypharmacy 08/05/2017   Cerebrovascular accident (CVA) due to stenosis of left middle cerebral artery (HCC) 06/26/2017   Urinary retention due to benign prostatic hyperplasia 06/22/2017   Falls 06/20/2017   Risk for falls 02/19/2017   Dyslipidemia 11/19/2016   Peripheral artery disease (HCC) 08/28/2016   Claudication (HCC) 08/09/2016   History of alcohol abuse 08/09/2016   History of drug dependence/abuse (HCC) 08/09/2016   Severe single current episode of major depressive disorder, without psychotic features (HCC) 08/09/2016   Mitral insufficiency 01/04/2016   Prediabetes 12/29/2015   Essential hypertension 12/29/2015   Fracture of left clavicle 07/13/2012     Physical Exam  Triage Vital Signs: ED Triage Vitals  Enc Vitals Group     BP 11/01/22 1339 95/69     Pulse Rate 11/01/22 1339 60     Resp 11/01/22 1339 16     Temp --      Temp src --      SpO2 11/01/22 1339 100 %     Weight --      Height --      Head Circumference --      Peak Flow --      Pain Score 11/01/22 1325 0     Pain Loc --      Pain Edu? --      Excl. in GC? --     Most recent vital signs: Vitals:    11/01/22 1730 11/01/22 1900  BP:  105/87  Pulse:    Resp:  20  Temp:    SpO2: 100%      General: Awake, no distress.  CV:  Good peripheral perfusion.  Resp:  Normal effort.  No increased work of breathing Abd:  No distention.  Neuro:             Awake, Alert, Oriented x 2 Other:  Mild pitting edema in the lower extremities   ED Results / Procedures / Treatments  Labs (all labs ordered are listed, but only abnormal results are displayed) Labs Reviewed  BASIC METABOLIC PANEL - Abnormal; Notable for the following components:  Result Value   Sodium 132 (*)    Chloride 96 (*)    BUN 26 (*)    Creatinine, Ser 2.30 (*)    GFR, Estimated 31 (*)    All other components within normal limits  CBC - Abnormal; Notable for the following components:   RDW 17.2 (*)    Platelets 142 (*)    All other components within normal limits  TROPONIN I (HIGH SENSITIVITY) - Abnormal; Notable for the following components:   Troponin I (High Sensitivity) 43 (*)    All other components within normal limits  TROPONIN I (HIGH SENSITIVITY) - Abnormal; Notable for the following components:   Troponin I (High Sensitivity) 31 (*)    All other components within normal limits  MAGNESIUM     EKG  EKG reviewed interpreted myself shows an AV dual paced rhythm no Sgarbossa criteria to suggest acute ischemia   RADIOLOGY Chest x-ray reviewed interpreted myself shows left lower lobe atelectasis   PROCEDURES:  Critical Care performed: No  .1-3 Lead EKG Interpretation  Performed by: Georga Hacking, MD Authorized by: Georga Hacking, MD     Interpretation: abnormal     ECG rate assessment: normal     Rhythm: paced     Ectopy: none     Conduction: normal     The patient is on the cardiac monitor to evaluate for evidence of arrhythmia and/or significant heart rate changes.   MEDICATIONS ORDERED IN ED: Medications  amiodarone (PACERONE) tablet 400 mg (400 mg Oral Given 11/01/22 1733)   torsemide (DEMADEX) tablet 60 mg (60 mg Oral Given 11/01/22 1733)     IMPRESSION / MDM / ASSESSMENT AND PLAN / ED COURSE  I reviewed the triage vital signs and the nursing notes.                              Patient's presentation is most consistent with acute presentation with potential threat to life or bodily function.  Differential diagnosis includes, but is not limited to, ACS, CHF exacerbation, electrolyte abnormality  The patient is a 62 year old male with multiple medical comorbidities including severe CHF and recurrent VT status post AICD who presents today after the ICD shocked.  Patient has difficulty providing much history but does endorse that shocked by ICD he says around 7 AM when he was walking to use the urinal.  He is denying any chest pain or dyspnea currently.  Denies complaints.  Repeatedly tells me that he has "issues" with his heart but has difficulty specifying and I do not think he has great insight into his disease.  Patient does have a legal guardian through DSS with no close family members.    Patient's vital signs overall reassuring he saturating well not requiring oxygen blood pressure is soft but this is chronic for him he is on midodrine.  He does not have any increased work of breathing just mild pitting edema.  Chest x-ray read as radiology as possible left sided infiltrate versus atelectasis but patient's not hypoxic does not have cough or leukocytosis I feel is low likelihood that he has pneumonia.  EKG shows AV paced rhythm.  Did interrogate the pacemaker and he did have 2 episodes of VT 35 J shock.   Patient's electrolytes are normal renal function is slightly bumped from his baseline.  Troponin elevated at 43 which is where he has been in the past as well.  I see during his last admission about a month ago plan was for him to go to hospice.  I did call the DSS social worker who also got he was supposed to be on hospice.  When she called the facility she  learned that hospice did come out and doing evaluations that he was not eligible for hospice so plan is for palliative care.   I spoke with Dr. Mariah Milling with cardiology who reviewed patient's records and is somewhat familiar with the patient.  He recommended increasing the dose of Lasix to 60 twice daily for 3 days and then going back to 60 daily as well as increasing the amiodarone to 400 twice daily over the next 2 weeks and then he will need to be reassessed.  I think that given the likely transition is to palliative versus hospice but is somewhat unclear if palliative has started right now that it would be reasonable to do these interventions as an outpatient and get patient back to his facility.  Will give a dose of amiodarone here in the ER as well as a dose of p.o. torsemide.     FINAL CLINICAL IMPRESSION(S) / ED DIAGNOSES   Final diagnoses:  Ventricular tachycardia (HCC)     Rx / DC Orders   ED Discharge Orders     None        Note:  This document was prepared using Dragon voice recognition software and may include unintentional dictation errors.   Georga Hacking, MD 11/01/22 [phone]

## 2022-11-01 NOTE — ED Notes (Signed)
Pts leal guardian notified about DC. Pts facility notified about DC. RN was told to expect a call back about transportation plans back to facility.

## 2022-11-01 NOTE — Telephone Encounter (Signed)
Returned call to caregiver.  Per caregiver Pt states he was aware of getting the shock, but he denied any symptoms-no chest pain, sob.  Advised message has been forwarded to cardiologists.  Will return call to caregiver if any orders received.  [phone]

## 2022-11-01 NOTE — ED Triage Notes (Signed)
Pt comes via EMs from home with c/o pacemaker shocking him. Pt was notified by his PCP and he was sent him for evaluation.  Pt not complaining of anything. Pt might have dementia.

## 2022-11-01 NOTE — Discharge Instructions (Addendum)
Please increase the dose of amiodarone from 400 mg once a day to 400 mg twice a day for the next 2 weeks.  For the next 3 days take torsemide twice a day and then go back to the once a day 60 mg dose.  Please follow-up with the cardiologist in 2 weeks.  Please follow-up with palliative care.

## 2022-11-01 NOTE — ED Notes (Signed)
DSS worker is here to pick up Pt. Pt assisted to wheelchair and to car. DC instructions reviewed with DSS worker

## 2022-11-04 NOTE — Telephone Encounter (Signed)
Noted  

## 2022-11-08 ENCOUNTER — Other Ambulatory Visit: Payer: Self-pay

## 2022-11-08 ENCOUNTER — Telehealth: Payer: Self-pay | Admitting: Cardiovascular Disease

## 2022-11-08 ENCOUNTER — Encounter: Payer: Self-pay | Admitting: Medical

## 2022-11-08 ENCOUNTER — Encounter: Payer: Self-pay | Admitting: Emergency Medicine

## 2022-11-08 ENCOUNTER — Ambulatory Visit: Payer: Medicaid Other | Attending: Cardiovascular Disease | Admitting: Medical

## 2022-11-08 ENCOUNTER — Telehealth: Payer: Self-pay | Admitting: Medical

## 2022-11-08 ENCOUNTER — Emergency Department

## 2022-11-08 ENCOUNTER — Inpatient Hospital Stay
Admission: EM | Admit: 2022-11-08 | Discharge: 2022-12-07 | DRG: 682 | Disposition: E | Source: Skilled Nursing Facility | Attending: Internal Medicine | Admitting: Internal Medicine

## 2022-11-08 VITALS — BP 76/60 | HR 103

## 2022-11-08 DIAGNOSIS — E875 Hyperkalemia: Secondary | ICD-10-CM | POA: Diagnosis present

## 2022-11-08 DIAGNOSIS — F17210 Nicotine dependence, cigarettes, uncomplicated: Secondary | ICD-10-CM | POA: Diagnosis present

## 2022-11-08 DIAGNOSIS — J690 Pneumonitis due to inhalation of food and vomit: Secondary | ICD-10-CM | POA: Diagnosis present

## 2022-11-08 DIAGNOSIS — E871 Hypo-osmolality and hyponatremia: Secondary | ICD-10-CM | POA: Diagnosis present

## 2022-11-08 DIAGNOSIS — I255 Ischemic cardiomyopathy: Secondary | ICD-10-CM | POA: Insufficient documentation

## 2022-11-08 DIAGNOSIS — N189 Chronic kidney disease, unspecified: Secondary | ICD-10-CM

## 2022-11-08 DIAGNOSIS — R531 Weakness: Secondary | ICD-10-CM | POA: Diagnosis present

## 2022-11-08 DIAGNOSIS — J189 Pneumonia, unspecified organism: Secondary | ICD-10-CM

## 2022-11-08 DIAGNOSIS — E872 Acidosis, unspecified: Secondary | ICD-10-CM | POA: Diagnosis present

## 2022-11-08 DIAGNOSIS — N1832 Chronic kidney disease, stage 3b: Secondary | ICD-10-CM | POA: Diagnosis present

## 2022-11-08 DIAGNOSIS — I130 Hypertensive heart and chronic kidney disease with heart failure and stage 1 through stage 4 chronic kidney disease, or unspecified chronic kidney disease: Secondary | ICD-10-CM | POA: Diagnosis present

## 2022-11-08 DIAGNOSIS — Z79899 Other long term (current) drug therapy: Secondary | ICD-10-CM

## 2022-11-08 DIAGNOSIS — I472 Ventricular tachycardia, unspecified: Secondary | ICD-10-CM | POA: Diagnosis present

## 2022-11-08 DIAGNOSIS — Z95810 Presence of automatic (implantable) cardiac defibrillator: Secondary | ICD-10-CM

## 2022-11-08 DIAGNOSIS — R0602 Shortness of breath: Secondary | ICD-10-CM | POA: Insufficient documentation

## 2022-11-08 DIAGNOSIS — G9341 Metabolic encephalopathy: Secondary | ICD-10-CM | POA: Diagnosis present

## 2022-11-08 DIAGNOSIS — D696 Thrombocytopenia, unspecified: Secondary | ICD-10-CM | POA: Diagnosis present

## 2022-11-08 DIAGNOSIS — Z515 Encounter for palliative care: Secondary | ICD-10-CM

## 2022-11-08 DIAGNOSIS — I252 Old myocardial infarction: Secondary | ICD-10-CM

## 2022-11-08 DIAGNOSIS — J449 Chronic obstructive pulmonary disease, unspecified: Secondary | ICD-10-CM | POA: Diagnosis present

## 2022-11-08 DIAGNOSIS — Z91018 Allergy to other foods: Secondary | ICD-10-CM

## 2022-11-08 DIAGNOSIS — I480 Paroxysmal atrial fibrillation: Secondary | ICD-10-CM | POA: Diagnosis present

## 2022-11-08 DIAGNOSIS — I5042 Chronic combined systolic (congestive) and diastolic (congestive) heart failure: Secondary | ICD-10-CM | POA: Diagnosis present

## 2022-11-08 DIAGNOSIS — Z955 Presence of coronary angioplasty implant and graft: Secondary | ICD-10-CM

## 2022-11-08 DIAGNOSIS — E039 Hypothyroidism, unspecified: Secondary | ICD-10-CM | POA: Diagnosis present

## 2022-11-08 DIAGNOSIS — I959 Hypotension, unspecified: Secondary | ICD-10-CM | POA: Diagnosis present

## 2022-11-08 DIAGNOSIS — I2510 Atherosclerotic heart disease of native coronary artery without angina pectoris: Secondary | ICD-10-CM | POA: Diagnosis present

## 2022-11-08 DIAGNOSIS — Z951 Presence of aortocoronary bypass graft: Secondary | ICD-10-CM

## 2022-11-08 DIAGNOSIS — Z91041 Radiographic dye allergy status: Secondary | ICD-10-CM

## 2022-11-08 DIAGNOSIS — Z87898 Personal history of other specified conditions: Secondary | ICD-10-CM

## 2022-11-08 DIAGNOSIS — Z880 Allergy status to penicillin: Secondary | ICD-10-CM

## 2022-11-08 DIAGNOSIS — R627 Adult failure to thrive: Secondary | ICD-10-CM | POA: Diagnosis not present

## 2022-11-08 DIAGNOSIS — G40909 Epilepsy, unspecified, not intractable, without status epilepticus: Secondary | ICD-10-CM | POA: Diagnosis present

## 2022-11-08 DIAGNOSIS — Z882 Allergy status to sulfonamides status: Secondary | ICD-10-CM

## 2022-11-08 DIAGNOSIS — Z66 Do not resuscitate: Secondary | ICD-10-CM | POA: Diagnosis present

## 2022-11-08 DIAGNOSIS — F319 Bipolar disorder, unspecified: Secondary | ICD-10-CM | POA: Diagnosis present

## 2022-11-08 DIAGNOSIS — N179 Acute kidney failure, unspecified: Principal | ICD-10-CM | POA: Diagnosis present

## 2022-11-08 DIAGNOSIS — Z8042 Family history of malignant neoplasm of prostate: Secondary | ICD-10-CM

## 2022-11-08 DIAGNOSIS — Z7901 Long term (current) use of anticoagulants: Secondary | ICD-10-CM

## 2022-11-08 DIAGNOSIS — Z8673 Personal history of transient ischemic attack (TIA), and cerebral infarction without residual deficits: Secondary | ICD-10-CM

## 2022-11-08 DIAGNOSIS — Z79890 Hormone replacement therapy (postmenopausal): Secondary | ICD-10-CM

## 2022-11-08 DIAGNOSIS — Z881 Allergy status to other antibiotic agents status: Secondary | ICD-10-CM

## 2022-11-08 LAB — CBC
HCT: 40 % (ref 39.0–52.0)
Hemoglobin: 13.5 g/dL (ref 13.0–17.0)
MCH: 29.1 pg (ref 26.0–34.0)
MCHC: 33.8 g/dL (ref 30.0–36.0)
MCV: 86.2 fL (ref 80.0–100.0)
Platelets: 162 10*3/uL (ref 150–400)
RBC: 4.64 MIL/uL (ref 4.22–5.81)
RDW: 17.2 % — ABNORMAL HIGH (ref 11.5–15.5)
WBC: 5.6 10*3/uL (ref 4.0–10.5)
nRBC: 0.5 % — ABNORMAL HIGH (ref 0.0–0.2)

## 2022-11-08 LAB — BASIC METABOLIC PANEL
Anion gap: 16 — ABNORMAL HIGH (ref 5–15)
BUN: 73 mg/dL — ABNORMAL HIGH (ref 8–23)
CO2: 19 mmol/L — ABNORMAL LOW (ref 22–32)
Calcium: 9.6 mg/dL (ref 8.9–10.3)
Chloride: 93 mmol/L — ABNORMAL LOW (ref 98–111)
Creatinine, Ser: 3.83 mg/dL — ABNORMAL HIGH (ref 0.61–1.24)
GFR, Estimated: 17 mL/min — ABNORMAL LOW (ref >60)
Glucose, Bld: 119 mg/dL — ABNORMAL HIGH (ref 70–99)
Potassium: 5 mmol/L (ref 3.5–5.1)
Sodium: 128 mmol/L — ABNORMAL LOW (ref 135–145)

## 2022-11-08 LAB — HEPATIC FUNCTION PANEL
ALT: 31 U/L (ref 0–44)
AST: 54 U/L — ABNORMAL HIGH (ref 15–41)
Albumin: 3.6 g/dL (ref 3.5–5.0)
Alkaline Phosphatase: 114 U/L (ref 38–126)
Bilirubin, Direct: 0.9 mg/dL — ABNORMAL HIGH (ref 0.0–0.2)
Indirect Bilirubin: 1.2 mg/dL — ABNORMAL HIGH (ref 0.3–0.9)
Total Bilirubin: 2.1 mg/dL — ABNORMAL HIGH (ref 0.3–1.2)
Total Protein: 7.5 g/dL (ref 6.5–8.1)

## 2022-11-08 LAB — MAGNESIUM: Magnesium: 2.5 mg/dL — ABNORMAL HIGH (ref 1.7–2.4)

## 2022-11-08 LAB — TROPONIN I (HIGH SENSITIVITY)
Troponin I (High Sensitivity): 76 ng/L — ABNORMAL HIGH (ref <18)
Troponin I (High Sensitivity): 72 ng/L — ABNORMAL HIGH (ref <18)

## 2022-11-08 LAB — BRAIN NATRIURETIC PEPTIDE: B Natriuretic Peptide: 3178.9 pg/mL — ABNORMAL HIGH (ref 0.0–100.0)

## 2022-11-08 MED ORDER — LEVETIRACETAM IN NACL 500 MG/100ML IV SOLN
500.0000 mg | INTRAVENOUS | Status: DC
  Administered 2022-11-09 – 2022-11-10 (×3): 500 mg via INTRAVENOUS
  Filled 2022-11-08 (×3): qty 100

## 2022-11-08 MED ORDER — ONDANSETRON HCL 4 MG PO TABS: 4.0000 mg | ORAL_TABLET | Freq: Four times a day (QID) | ORAL | Status: DC | PRN

## 2022-11-08 MED ORDER — SODIUM CHLORIDE 0.9 % IV BOLUS
500.0000 mL | Freq: Once | INTRAVENOUS | Status: AC
  Administered 2022-11-08: 500 mL via INTRAVENOUS

## 2022-11-08 MED ORDER — CALCIUM GLUCONATE-NACL 1-0.675 GM/50ML-% IV SOLN
1.0000 g | Freq: Once | INTRAVENOUS | Status: AC
  Administered 2022-11-08: 1000 mg via INTRAVENOUS
  Filled 2022-11-08: qty 50

## 2022-11-08 MED ORDER — MIDODRINE HCL 5 MG PO TABS
10.0000 mg | ORAL_TABLET | Freq: Once | ORAL | Status: DC
  Filled 2022-11-08: qty 2

## 2022-11-08 MED ORDER — SODIUM CHLORIDE 0.9 % IV SOLN: INTRAVENOUS | Status: AC

## 2022-11-08 MED ORDER — ACETAMINOPHEN 325 MG PO TABS
650.0000 mg | ORAL_TABLET | Freq: Four times a day (QID) | ORAL | Status: DC | PRN
  Administered 2022-11-13: 650 mg via ORAL
  Filled 2022-11-08: qty 2

## 2022-11-08 MED ORDER — MIDODRINE HCL 5 MG PO TABS: 5.0000 mg | ORAL_TABLET | Freq: Three times a day (TID) | ORAL | 11 refills | Status: DC

## 2022-11-08 MED ORDER — SODIUM BICARBONATE 8.4 % IV SOLN
25.0000 meq | Freq: Once | INTRAVENOUS | Status: DC
  Filled 2022-11-08: qty 50

## 2022-11-08 MED ORDER — SODIUM CHLORIDE 0.9 % IV SOLN
2.0000 g | Freq: Once | INTRAVENOUS | Status: AC
  Administered 2022-11-08: 2 g via INTRAVENOUS
  Filled 2022-11-08: qty 10

## 2022-11-08 MED ORDER — VANCOMYCIN HCL IN DEXTROSE 1-5 GM/200ML-% IV SOLN
1000.0000 mg | Freq: Once | INTRAVENOUS | Status: AC
  Administered 2022-11-08: 1000 mg via INTRAVENOUS
  Filled 2022-11-08: qty 200

## 2022-11-08 MED ORDER — ALBUTEROL SULFATE (2.5 MG/3ML) 0.083% IN NEBU
2.5000 mg | INHALATION_SOLUTION | Freq: Four times a day (QID) | RESPIRATORY_TRACT | Status: DC | PRN
  Filled 2022-11-08: qty 3

## 2022-11-08 MED ORDER — HEPARIN SODIUM (PORCINE) 5000 UNIT/ML IJ SOLN
5000.0000 [IU] | Freq: Three times a day (TID) | INTRAMUSCULAR | Status: DC
  Administered 2022-11-09 – 2022-11-11 (×7): 5000 [IU] via SUBCUTANEOUS
  Filled 2022-11-08 (×8): qty 1

## 2022-11-08 MED ORDER — ONDANSETRON HCL 4 MG/2ML IJ SOLN: 4.0000 mg | Freq: Four times a day (QID) | INTRAMUSCULAR | Status: DC | PRN

## 2022-11-08 MED ORDER — SODIUM CHLORIDE 0.9 % IV SOLN
1.0000 g | Freq: Three times a day (TID) | INTRAVENOUS | Status: DC
  Administered 2022-11-09: 1 g via INTRAVENOUS
  Filled 2022-11-08 (×2): qty 5

## 2022-11-08 MED ORDER — ACETAMINOPHEN 650 MG RE SUPP: 650.0000 mg | Freq: Four times a day (QID) | RECTAL | Status: DC | PRN

## 2022-11-08 NOTE — Patient Instructions (Signed)
Medication Instructions:  Stop Isosorbide (Imdur) INCREASE Midodrine to 5mg  three times daily *If you need a refill on your cardiac medications before your next appointment, please call your pharmacy*   Lab Work: NONE If you have labs (blood work) drawn today and your tests are completely normal, you will receive your results only by: MyChart Message (if you have MyChart) OR A paper copy in the mail If you have any lab test that is abnormal or we need to change your treatment, we will call you to review the results.   Testing/Procedures: Ambulatory Referral to Palliative care  Needs appt with Heart Failure  Needs appt with EP Graciela Husbands)   Follow-Up: At [facility], you and your health needs are our priority.  As part of our continuing mission to provide you with exceptional heart care, we have created designated Provider Care Teams.  These Care Teams include your primary Cardiologist (physician) and Advanced Practice Providers (APPs -  Physician Assistants and Nurse Practitioners) who all work together to provide you with the care you need, when you need it.  We recommend signing up for the patient portal called "MyChart".  Sign up information is provided on this After Visit Summary.  MyChart is used to connect with patients for Virtual Visits (Telemedicine).  Patients are able to view lab/test results, encounter notes, upcoming appointments, etc.  Non-urgent messages can be sent to your provider as well.   To learn more about what you can do with MyChart, go to [email or web address].    Your next appointment:   As scheduled with HF  Provider:   You will see one of the following Advanced Practice Providers on your designated Care Team:   Nicolasa Ducking, NP Eula Listen, PA-C Cadence Fransico Michael, PA-C Charlsie Quest, NP

## 2022-11-08 NOTE — Telephone Encounter (Signed)
Per Cadence Lorna Few, she would like the patient to send in a remote transmission on his ICD. Called the facility that the patient resides at and spoke with someone about this. He seemed very confused and ultimately stated he would pass this request to the primary caretaker of Carl [name] and ask her to call us back if she had any questions. Will route this to device clinic to put on their radar to ensure we get it.

## 2022-11-08 NOTE — Assessment & Plan Note (Signed)
Patient presents to the ER and is noted to be very lethargic and confused Will obtain ammonia levels for further evaluation

## 2022-11-08 NOTE — ED Triage Notes (Signed)
Patient to ED via [facility]. Patient cardiologist appointment today- after leaving appointment his pacemaker showed "slow VT." Cardiologist office advised patient to come to ED. Patient unable to answer this RN's questions. Appears in no distress at this time- will not answer if he is currently having chest pain.

## 2022-11-08 NOTE — Assessment & Plan Note (Signed)
Hold Synthroid since patient is very lethargic and unable to take anything p.o.

## 2022-11-08 NOTE — Assessment & Plan Note (Signed)
Hold oral antidepressants for now since patient is unable to take anything p.o.

## 2022-11-08 NOTE — Telephone Encounter (Signed)
Reviewed transmission. Unscheduled manual transmission received. Presenting slow VT.  Clinic requested transmission - route to triage Normal device function, 10 NSVT, no therapy.  HF diagnostics currently abnormal  Patient is in a slow VT.  Per Candence who saw patient earlier today, he was also not very alert at appointment. Reviewed with Dr. Lalla Brothers. Patient's caregiver Delorise Shiner notified patient needs to go by EMS to ER immediately.  Grace verbalizes understanding.

## 2022-11-08 NOTE — Telephone Encounter (Signed)
Caller wanted to report the patient has already been assigned to [facility] and was also referred to [facility] at his last visit.  Caller is concerned this may be a duplicate.

## 2022-11-08 NOTE — Telephone Encounter (Signed)
Received a phone call from [place] at [facility]. Delorise Shiner said that the patient is already being followed by [facility] times two months. Plan was to reevaluate the patient at 3 months to see if he still qualified for hospice or needed to transition to palliative care. Delorise Shiner thought that we maybe unaware of [facility] already following the patient. Forward to Cadence Mayo PA for review as a palliative care order was placed at the patient's office visit today.

## 2022-11-08 NOTE — Telephone Encounter (Signed)
Patient was seen in clinic today and he was tachycardic and hypotensive. Remote device download was sent in after the visit, and one of the device nurses called to report the download showed slow VT. The patient's legal guardian, Carl [name] was contacted, who was notified of results.  Recommendations are for ER evaluation. she said she would contact the group home ([facility]) and notify them of recommended ER evaluation.

## 2022-11-08 NOTE — Telephone Encounter (Signed)
Transmission received 11/08/2022. 

## 2022-11-08 NOTE — ED Provider Notes (Signed)
[facility] Provider Note    Event Date/Time   First MD Initiated Contact with Patient 11/08/22 Carl [name]     (approximate)   History   Weakness   HPI  Carl [name] is a 62 y.o. male  here with generalized weakness, confusion. Pt was told to come to the ED after an interrogation in cardiology clinic showed he was in "Slow VT." He has been weak and progressively less responsive. He is currently on Hospice but has been going "back and forth" about whether he meets criteria. He is unable to provide any history on my assessment 2/2 confusion, decreased LOC. Reviewed Cardiology notes from clinic today and telephone notes marking slow VT.       Physical Exam   Triage Vital Signs: ED Triage Vitals [11/08/22 1809]  Enc Vitals Group     BP (!) 156/126     Pulse Rate 99     Resp 18     Temp 97.8 F (36.6 C)     Temp Source Oral     SpO2 94 %     Weight      Height      Head Circumference      Peak Flow      Pain Score      Pain Loc      Pain Edu?      Excl. in GC?     Most recent vital signs: Vitals:   11/09/22 0030 11/09/22 0140  BP: 91/70 (!) 73/61  Pulse: 95 89  Resp: 20 (!) 25  Temp:    SpO2: 97% 98%     General: Drowsy, GCS 13. CV:  Good peripheral perfusion. Regular rate. Resp:  Tachypneic, but normal aeration. Abd:  No distention. No tenderness. Other:  No edema.   ED Results / Procedures / Treatments   Labs (all labs ordered are listed, but only abnormal results are displayed) Labs Reviewed  BASIC METABOLIC PANEL - Abnormal; Notable for the following components:      Result Value   Sodium 128 (*)    Chloride 93 (*)    CO2 19 (*)    Glucose, Bld 119 (*)    BUN 73 (*)    Creatinine, Ser 3.83 (*)    GFR, Estimated 17 (*)    Anion gap 16 (*)    All other components within normal limits  CBC - Abnormal; Notable for the following components:   RDW 17.2 (*)    nRBC 0.5 (*)    All other components within normal limits   MAGNESIUM - Abnormal; Notable for the following components:   Magnesium 2.5 (*)    All other components within normal limits  HEPATIC FUNCTION PANEL - Abnormal; Notable for the following components:   AST 54 (*)    Total Bilirubin 2.1 (*)    Bilirubin, Direct 0.9 (*)    Indirect Bilirubin 1.2 (*)    All other components within normal limits  BRAIN NATRIURETIC PEPTIDE - Abnormal; Notable for the following components:   B Natriuretic Peptide 3,178.9 (*)    All other components within normal limits  AMMONIA - Abnormal; Notable for the following components:   Ammonia 65 (*)    All other components within normal limits  TROPONIN I (HIGH SENSITIVITY) - Abnormal; Notable for the following components:   Troponin I (High Sensitivity) 76 (*)    All other components within normal limits  TROPONIN I (HIGH SENSITIVITY) - Abnormal; Notable for the following components:  Troponin I (High Sensitivity) 72 (*)    All other components within normal limits  URINALYSIS, ROUTINE W REFLEX MICROSCOPIC  BASIC METABOLIC PANEL  CBC     EKG Atrial sensed, V Paced rhythm. VR 101, QRS 169, QTc 542. No acute st elevations or depressions.   RADIOLOGY CXR: Cardiomegaly, central congestion, patchy airspace opacity RUL   I also independently reviewed and agree with radiologist interpretations.   PROCEDURES:  Critical Care performed: Yes, see critical care procedure note(s)  .Critical Care  Performed by: Carl Pollack, MD Authorized by: Carl Pollack, MD   Critical care provider statement:    Critical care time (minutes):  30   Critical care time was exclusive of:  Separately billable procedures and treating other patients   Critical care was necessary to treat or prevent imminent or life-threatening deterioration of the following conditions:  Cardiac failure, circulatory failure and respiratory failure   Critical care was time spent personally by me on the following activities:  Development of  treatment plan with patient or surrogate, discussions with consultants, evaluation of patient's response to treatment, examination of patient, ordering and review of laboratory studies, ordering and review of radiographic studies, ordering and performing treatments and interventions, pulse oximetry, re-evaluation of patient's condition and review of old charts     MEDICATIONS ORDERED IN ED: Medications  albuterol (PROVENTIL) (2.5 MG/3ML) 0.083% nebulizer solution 2.5 mg (has no administration in time range)  levETIRAcetam (KEPPRA) IVPB 500 mg/100 mL premix (0 mg Intravenous Stopped 11/09/22 0038)  heparin injection 5,000 Units (5,000 Units Subcutaneous Given 11/09/22 0009)  0.9 %  sodium chloride infusion ( Intravenous New Bag/Given 11/09/22 0011)  acetaminophen (TYLENOL) tablet 650 mg (has no administration in time range)    Or  acetaminophen (TYLENOL) suppository 650 mg (has no administration in time range)  ondansetron (ZOFRAN) tablet 4 mg (has no administration in time range)    Or  ondansetron (ZOFRAN) injection 4 mg (has no administration in time range)  aztreonam (AZACTAM) 1 g in sodium chloride 0.9 % 100 mL IVPB (has no administration in time range)  calcium gluconate 1 g/ 50 mL sodium chloride IVPB (0 mg Intravenous Stopped 11/08/22 2005)  sodium chloride 0.9 % bolus 500 mL (0 mLs Intravenous Stopped 11/08/22 2005)  aztreonam (AZACTAM) 2 g in sodium chloride 0.9 % 100 mL IVPB (0 g Intravenous Stopped 11/08/22 2008)  vancomycin (VANCOCIN) IVPB 1000 mg/200 mL premix (0 mg Intravenous Stopped 11/08/22 2111)     IMPRESSION / MDM / ASSESSMENT AND PLAN / ED COURSE  I reviewed the triage vital signs and the nursing notes.                              Differential diagnosis includes, but is not limited to, sepsis, pneumonia, acute on chronic CHF, CKD, hyperkalemia/electrolyte abnormality  Patient's presentation is most consistent with acute presentation with potential threat to life or bodily  function.  The patient is on the cardiac monitor to evaluate for evidence of arrhythmia and/or significant heart rate changes  62 yo M with advanced HF, on Hospice, here with reported "slow VT" noted on pacemaker/AICD as well as decreased LOC. Pt lethargic but protecting airway on arrival. Moderate hypotension noted. CXR shows possible PNA. Labs reveal significant acute on chronic kidney injury which I suspect is contributing to his tachycardia and hypotension.    Had a long discussion with pt's guardian/POA. Pt is DNR/DNI. Would not desire any  aggressive intervention/heroic measures. Likely will transition to comfort care but will give IVF, abx for now while awaiting formal discussion with family.  FINAL CLINICAL IMPRESSION(S) / ED DIAGNOSES   Final diagnoses:  Generalized weakness  Acute renal failure superimposed on chronic kidney disease, unspecified acute renal failure type, unspecified CKD stage (HCC)  Pneumonia due to infectious organism, unspecified laterality, unspecified part of lung     Rx / DC Orders   ED Discharge Orders     None        Note:  This document was prepared using Dragon voice recognition software and may include unintentional dictation errors.   Carl Pollack, MD 11/09/22 [phone]

## 2022-11-08 NOTE — Assessment & Plan Note (Signed)
Secondary to ischemic cardiomyopathy Last known LVEF of less than 20% from a 2D echocardiogram which was done 03/24 Hold losartan, torsemide, spironolactone and metoprolol due to AKI and relative hypotension Patient's overall prognosis is very poor

## 2022-11-08 NOTE — Assessment & Plan Note (Signed)
Hold metoprolol Hold Eliquis since patient is unable to take anything p.o.

## 2022-11-08 NOTE — Assessment & Plan Note (Signed)
Hyponatremia may be secondary to spironolactone use versus hypovolemic hyponatremia Gentle IV fluid hydration Repeat sodium levels in a.m.

## 2022-11-08 NOTE — Consult Note (Signed)
PHARMACY -  BRIEF ANTIBIOTIC NOTE   Pharmacy has received consult(s) for vancomycin from an ED provider.  The patient's profile has been reviewed for ht/wt/allergies/indication/available labs.    One time order(s) placed for vancomycin 1000 mg IV x 1  Further antibiotics/pharmacy consults should be ordered by admitting physician if indicated.                       Thank you,  Will M. Dareen Piano, PharmD PGY-1 Pharmacy Resident 11/08/2022 7:51 PM

## 2022-11-08 NOTE — Telephone Encounter (Signed)
Carl [name], called to see if transmission was received.  Please advise.

## 2022-11-08 NOTE — H&P (Addendum)
History and Physical    Patient: Carl [name] VHQ:[id number] DOB: [birth year] DOA: 11/08/2022 DOS: the patient was seen and examined on 11/08/2022 PCP: System, Provider Not In  Patient coming from: ALF/ILF  Chief Complaint:  Chief Complaint  Patient presents with   Weakness   Patient is unable to provide any history and most of the history was obtained from the EMR.  Unable to reach legal guardian at this time. HPI: Carl [name] is a 62 y.o. male with medical history significant for bipolar disorder, diabetes mellitus with complications of stage III chronic kidney disease, coronary artery disease status post CABG status post stent angioplasty, ischemic cardiomyopathy status post AICD insertion, history of chronic combined systolic and diastolic dysfunction CHF with last known LVEF of 20 percent from a 2D echocardiogram which was done 03/24 , depression, history of paroxysmal A-fib, seizure disorder who was sent to the ER from the group home where he resides for evaluation of slow ventricular tachycardia. Patient was seen at the cardiology clinic on the day of admission and was noted to be hypotensive with systolic blood pressure of 88.  He was noted to be oriented only to person.  His device was remotely downloaded and patient was advised to go to the ER because he was noted to have slow ventricular tachycardia. Upon arrival to the ER he was noted to be very lethargic and oriented only to person, not to place or time. I am unable to do review of systems on this patient due to his altered mental status. Vital signs show a blood pressure of 76/60, tachycardic with heart rate of 103 Abnormal labs included BNP of 3178, sodium 128, creatinine 3.83 above a baseline of 2.30, troponin is flat 72 >> 76 Chest x-ray reviewed by me shows cardiomegaly with central pulmonary vascular congestion. Minimal patchy airspace opacities in the right upper lobe, which could be infectious or inflammatory. Twelve-lead EKG  reviewed by me shows atrial sensed ventricular paced rhythm. Patient received 500 cc of normal saline in the ER as well as a dose of vancomycin and Azactam. He will be admitted to the hospital for further evaluation.    Review of Systems: As mentioned in the history of present illness. All other systems reviewed and are negative. Past Medical History:  Diagnosis Date   Acute pulmonary edema (HCC) 2017   Acute respiratory failure with hypoxia (HCC) 2017   Bipolar 1 disorder (HCC)    CAD (coronary artery disease)    a. Prior RCA stenting; b. 2017 s/p CABG x 2 (LIMA->D1, VG->OM2); b. 04/2022 Demand Isch/Cath: LM mild dzs, LAD 66m, D1 100, LCX mod diff dzs, OM2 100, OM3 50, RCA 40/20p ISR, 85d (iFR 0.99), 50d, RPAV 60, LIMA->D1 small, patent, VG->OM2 large, mild dzs-->Med Rx.   Chronic HFrEF (heart failure with reduced ejection fraction) (HCC)    a. 02/2022 Echo: EF 25-30%, mild conc LVH, nl RV fxn, sev dil LA, mildly dil RA, mild MR/AI.   CKD (chronic kidney disease), stage III (HCC)    COPD (chronic obstructive pulmonary disease) (HCC)    Depression    Hypertension    Ischemic cardiomyopathy    a. 04/2020 s/p MDT MRI compatible CRT-D (Ser # [id number]); b. 02/2022 Echo: EF 25-30%.   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   Seizures (HCC)    childhood   Stroke (HCC)    Tobacco abuse  Past Surgical History:  Procedure Laterality Date   BIV UPGRADE N/A 05/05/2020   Procedure: BIV ICD UPGRADE;  Surgeon: Duke Salvia, MD;  Location: [facility] INVASIVE CV LAB;  Service: Cardiovascular;  Laterality: N/A;   CORONARY ARTERY BYPASS GRAFT     CORONARY PRESSURE/FFR STUDY N/A 04/25/2022   Procedure: INTRAVASCULAR PRESSURE WIRE/FFR STUDY;  Surgeon: Yvonne Kendall, MD;  Location: [facility] INVASIVE CV LAB;  Service: Cardiovascular;  Laterality: N/A;   RIGHT/LEFT HEART CATH AND CORONARY/GRAFT  ANGIOGRAPHY N/A 04/25/2022   Procedure: RIGHT/LEFT HEART CATH AND CORONARY/GRAFT ANGIOGRAPHY;  Surgeon: Yvonne Kendall, MD;  Location: [facility] INVASIVE CV LAB;  Service: Cardiovascular;  Laterality: N/A;   Social History:  reports that he has been smoking cigarettes. He has been smoking an average of 0.25 packs per day. He has never used smokeless tobacco. He reports that he does not currently use alcohol. He reports that he does not use drugs.  Allergies  Allergen Reactions   Iodinated Contrast Media Shortness Of Breath   Penicillins Anaphylaxis and Shortness Of Breath    Respiratory  Tolerated cefuroxime on 01/06/16   Strawberry Extract Anaphylaxis   Cefepime Itching    Empiric antibiotic, developed pruritis.    Erythromycin Itching   Sulfa Antibiotics Itching, Nausea And Vomiting and Nausea Only    Family History  Problem Relation Age of Onset   Cancer Mother        "femal cancer"   Prostate cancer Father     Prior to Admission medications   Medication Sig Start Date End Date Taking? Authorizing Provider  acetaminophen (TYLENOL) 500 MG tablet Take 500 mg by mouth every 6 (six) hours as needed for mild pain.   Yes [provider]  albuterol (PROVENTIL) (2.5 MG/3ML) 0.083% nebulizer solution Take 3 mLs (2.5 mg total) by nebulization every 6 (six) hours as needed for wheezing or shortness of breath. 05/25/22  Yes Wieting, Richard, MD  albuterol (VENTOLIN HFA) 108 (90 Base) MCG/ACT inhaler Inhale 2 puffs into the lungs every 6 (six) hours as needed for wheezing. 05/25/22  Yes Wieting, Richard, MD  amiodarone (PACERONE) 400 MG tablet Take 400 mg by mouth daily.   Yes [provider]  apixaban (ELIQUIS) 5 MG TABS tablet TAKE 1 TABLET BY MOUTH TWICE A DAY Patient taking differently: Take 5 mg by mouth 2 (two) times daily. 01/18/21  Yes Duke Salvia, MD  CHLORASEPTIC 1.4 % LIQD Use as directed 1 spray in the mouth or throat as needed. 06/26/22  Yes [provider]  divalproex (DEPAKOTE ER) 500 MG 24 hr tablet Take 1 tablet (500 mg total) by mouth 2 (two) times daily. 05/24/19  Yes Clapacs, Jackquline Denmark, MD  escitalopram (LEXAPRO) 10 MG tablet Take 1 tablet (10 mg total) by mouth at bedtime. 05/24/19  Yes Clapacs, Jackquline Denmark, MD  ezetimibe (ZETIA) 10 MG tablet Take 1 tablet (10 mg total) by mouth daily. 04/01/22  Yes Hollice Espy, MD  HYDROcodone-acetaminophen (NORCO/VICODIN) 5-325 MG tablet Take 1 tablet by mouth every 6 (six) hours as needed for severe pain. 05/25/22  Yes Wieting, Richard, MD  Hydrocortisone Acetate 1 % CREA Apply 1 Application topically 3 (three) times daily as needed (itching). 05/15/22  Yes [provider]  levETIRAcetam (KEPPRA) 250 MG tablet Take 1 tablet (250 mg total) by mouth 2 (two) times daily. 03/24/20  Yes Minna Antis, MD  levothyroxine (SYNTHROID) 50 MCG tablet Take 50 mcg by mouth every morning. 06/30/22  Yes [provider]  losartan (COZAAR) 25 MG tablet Take 12.5 mg by mouth daily.   Yes [provider]  metoprolol succinate (TOPROL-XL) 25 MG 24 hr tablet Take 0.5 tablets (12.5 mg total) by mouth at bedtime. 09/24/22 11/08/22 Yes Delfino Lovett, MD  midodrine (PROAMATINE) 5 MG tablet Take 1 tablet (5 mg total) by mouth 3 (three) times daily with meals. 11/08/22  Yes Furth, Cadence H, PA-C  Multiple Vitamin (MULTIVITAMIN WITH MINERALS) TABS tablet Take 1 tablet by mouth daily. 05/01/22  Yes Enedina Finner, MD  nitroGLYCERIN (NITROSTAT) 0.4 MG SL tablet Place 1 tablet (0.4 mg total) under the tongue every 5 (five) minutes as needed for chest pain. 05/25/22  Yes Wieting, Richard, MD  omeprazole (PRILOSEC) 20 MG capsule Take 20 mg by mouth daily.   Yes [provider]  potassium chloride (KLOR-CON M) 10 MEQ tablet Take 4 tablets (40 mEq total) by mouth daily. 09/10/22  Yes Duke Salvia, MD  QUEtiapine (SEROQUEL) 200 MG tablet Take 200 mg by mouth at bedtime.   Yes [provider]   rosuvastatin (CRESTOR) 10 MG tablet Take 1 tablet (10 mg total) by mouth daily. Patient taking differently: Take 10 mg by mouth at bedtime. 05/16/22  Yes Hackney, Inetta Fermo A, FNP  spironolactone (ALDACTONE) 25 MG tablet Take 1 tablet (25 mg total) by mouth daily. 05/26/22  Yes Wieting, Richard, MD  tiotropium (SPIRIVA) 18 MCG inhalation capsule Place 18 mcg into inhaler and inhale daily.   Yes [provider]  Torsemide 60 MG TABS Take 60 mg by mouth daily. 09/24/22  Yes Delfino Lovett, MD  traMADol (ULTRAM) 50 MG tablet Take 50 mg by mouth every 12 (twelve) hours as needed for moderate pain.   Yes [provider]  hydrOXYzine (ATARAX) 10 MG tablet Take 10 mg by mouth 2 (two) times daily. Patient not taking: Reported on 11/08/2022 12/24/21   [provider]    Physical Exam: Vitals:   11/08/22 2100 11/08/22 2130 11/08/22 2220 11/08/22 2230  BP: 96/77 99/74 90/79  104/80  Pulse: 96 100 100 99  Resp: 20 (!) 28 (!) 39 (!) 21  Temp:      TempSrc:      SpO2: 98% 98% 96% 98%  Weight:      Height:       Physical Exam Vitals and nursing note reviewed.  Constitutional:      Comments: Chronically ill-appearing.  Lethargic but opens eyes to loud verbal stimuli.  Oriented only to person.  HENT:     Head: Normocephalic and atraumatic.     Nose: Nose normal.     Mouth/Throat:     Mouth: Mucous membranes are dry.  Eyes:     Conjunctiva/sclera: Conjunctivae normal.  Cardiovascular:     Rate and Rhythm: Normal rate and regular rhythm.  Pulmonary:     Breath sounds: Rales present.  Abdominal:     General: Bowel sounds are normal.     Palpations: Abdomen is soft.     Comments: Central adiposity  Musculoskeletal:     Cervical back: Normal range of motion and neck supple.     Right lower leg: Edema present.     Left lower leg: Edema present.  Neurological:     Comments: Unable to assess  Psychiatric:     Comments: Unable to assess     Data Reviewed: Relevant notes from  primary care and specialist visits, past discharge summaries as available in EHR, including Care Everywhere. Prior diagnostic testing as pertinent to  current admission diagnoses Updated medications and problem lists for reconciliation ED course, including vitals, labs, imaging, treatment and response to treatment Triage notes, nursing and pharmacy notes and ED provider's notes Notable results as noted in HPI Labs reviewed.  Troponin 76 >> 72, BNP 3178, total protein 7.5, albumin 3.6, AST 54, ALT 31, alkaline phosphatase 114, total bilirubin 2.1, direct bilirubin 0.9, indirect bilirubin 1.2, magnesium 2.5, sodium 128, potassium 5.0, chloride 93, bicarb 19, glucose 119, BUN 73 compared to baseline of 26, serum creatinine 3.83 compared to baseline of 2.30, calcium 9.6, white count 5.6, hemoglobin 13.5, hematocrit 40, platelet count 162 There are no new results to review at this time.  Assessment and Plan: * AKI (acute kidney injury) (HCC) Most likely secondary to overdiuresis versus cardiorenal syndrome Patient has a baseline serum creatinine of 2.30 and today on admission it is 3.83 Hold losartan, spironolactone and torsemide Gentle IV fluid hydration Repeat renal parameters in a.m.   Aspiration pneumonia [facility]) Patient noted to be very lethargic and arouses only to verbal stimuli He was brought into the ER for evaluation and was found to be tachypneic and hypotensive.  Blood pressure responded to gentle IV fluid resuscitation Imaging shows minimal patchy airspace opacities in the right upper lobe, which could be infectious or inflammatory. Findings are concerning for possible aspiration pneumonia Strict aspiration precautions Continue Azactam since patient has penicillin allergy  Hyponatremia Hyponatremia may be secondary to spironolactone use versus hypovolemic hyponatremia Gentle IV fluid hydration Repeat sodium levels in a.m.  Acute metabolic encephalopathy Patient presents to the ER  and is noted to be very lethargic and confused Will obtain ammonia levels for further evaluation  Chronic combined systolic and diastolic heart failure (HCC) Secondary to ischemic cardiomyopathy Last known LVEF of less than 20% from a 2D echocardiogram which was done 03/24 Hold losartan, torsemide, spironolactone and metoprolol due to AKI and relative hypotension Patient's overall prognosis is very poor   Hypothyroidism Hold Synthroid since patient is very lethargic and unable to take anything p.o.  Bipolar 1 disorder (HCC) Hold oral antidepressants for now since patient is unable to take anything p.o.  Paroxysmal atrial fibrillation (HCC) Hold metoprolol Hold Eliquis since patient is unable to take anything p.o.  History of seizure Will place patient on IV Keppra Seizure precautions  Ischemic cardiomyopathy Last known LVEF from a 2D echocardiogram which was done 03/24 was less than 20%. Hold losartan and spironolactone due to AKI and hold metoprolol due to relative hypotension      Advance Care Planning:   Code Status: DNR   Consults: Palliative care consult  Family Communication: Attempted to reach out to patient's legal guardian [name] Gobble without success.  Awaiting callback  Severity of Illness: The appropriate patient status for this patient is INPATIENT. Inpatient status is judged to be reasonable and necessary in order to provide the required intensity of service to ensure the patient's safety. The patient's presenting symptoms, physical exam findings, and initial radiographic and laboratory data in the context of their chronic comorbidities is felt to place them at high risk for further clinical deterioration. Furthermore, it is not anticipated that the patient will be medically stable for discharge from the hospital within 2 midnights of admission.   * I certify that at the point of admission it is my clinical judgment that the patient will require inpatient  hospital care spanning beyond 2 midnights from the point of admission due to high intensity of service, high risk for further deterioration and  high frequency of surveillance required.*  Author: Lucile Shutters, MD 11/08/2022 11:58 PM  For on call review [email or web address].

## 2022-11-08 NOTE — Progress Notes (Signed)
Cardiology Office Note:    Date:  11/08/2022   ID:  [name], DOB Apr 29, 1961, MRN [id number]  PCP:  System, Provider Not In  [facility] Cardiologist:  Lorine Bears, MD  [facility] Electrophysiologist:  [name], MD   Referring MD: Housecalls, Doctors Mak*   Chief Complaint: ER follow-up and hospital follow-up  History of Present Illness:    Carl [name] is a 62 y.o. male with a hx of CAD status post CABG, ICM, HFrEF, VT/VF status post CRT-D, hypertension, hyperlipidemia, PAF, tobacco abuse, COPD, CKD stage II 3, stroke, and bipolar disorder.  Patient underwent RCA stenting followed by CABG x 2 with a LIMA to D1, SVG to OM 2.  He underwent CRT-D in October 2021.  Echo August 2023 showed EF 25 to 30% with mild concentric LVH, normal RV function, severe LAE, and mild MR/AI.  He was hospitalized in August 2023 with acute CHF.  Seen in October 2023 with NYHA 3-2 B symptoms. beta-blocker and ACE were discontinued secondary to hypotension.  He was readmitted October 16 [facility].  In the ER, he was tachycardic with mild troponin elevation of 160.  He was treated with Solu-Medrol and DuoNebs.  Device interrogation was notable for VT that degenerated to VF resulting in ICD shock of which, the patient was unaware.  He was seen by EP and placed on amnio with recommendation for catheterization.  Cath performed and revealed 2 out of 2 patent grafts with moderate but patent RCA grafts and distal RCA bifurcation disease up to 85% with normal IFR.  Medical therapy was recommended and he was subsequently discharged.  He was readmitted in November 2023 with recurrent VT and ICD shock.  Amio was increased.  K was supplemented and he was diuresed.  Patient was readmitted January 2024 with chest pain and VT.  Patient was smoking at the time of VT.  He was presented to [facility] was cardioverted and then transferred to [facility].  Patient reported he was no longer taking amiodarone.  He was  started on IV amio and later transition to mexiletine 50 mg twice daily.  Patient was readmitted after having recurrent VT on amnio and mexiletine.  Repeat echo showed EF 15 to 20%.  Patient was volume overloaded and diuresed with IV Lasix.  Midodrine was required.  ER visit for ICD firing on 11/01/22. Notes say he was shocked by the ICD around 7am. BP was 95/69, HR 60. Labs showed Scr 2.3, BUN 26. Hs trop 43>31. EKG showed AV paced rhythm with heart rate in the 60s.pacemaker interrogation showed 2 episodes of VT 35J shock. Cardiology was consulted who recommended increasing lasix dose to 60mg  BID for 3 days then back down to 60mg  daily and increase amiodarone to 400mg  for the next 2 weeks. ER note suggest he is going to be transitioned to hospice.  Today, patient is alert and oriented x1. He is accompanied by a caretaker. He does not remember going to the ER.  Unable to get any history out of patient.  He denies any pain.  He says he is frustrated today.  Repeat blood pressure 88/60. Med list does not have mexiletine.  Past Medical History:  Diagnosis Date   Acute pulmonary edema (HCC) 2017   Acute respiratory failure with hypoxia (HCC) 2017   Bipolar 1 disorder (HCC)    CAD (coronary artery disease)    a. Prior RCA stenting; b. 2017 s/p CABG x 2 (LIMA->D1, VG->OM2); b. 04/2022 [facility]/Cath: LM mild  dzs, LAD 24m, D1 100, LCX mod diff dzs, OM2 100, OM3 50, RCA 40/20p ISR, 85d (iFR 0.99), 50d, RPAV 60, LIMA->D1 small, patent, VG->OM2 large, mild dzs-->Med Rx.   Chronic HFrEF (heart failure with reduced ejection fraction) (HCC)    a. 02/2022 Echo: EF 25-30%, mild conc LVH, nl RV fxn, sev dil LA, mildly dil RA, mild MR/AI.   CKD (chronic kidney disease), stage III (HCC)    COPD (chronic obstructive pulmonary disease) (HCC)    Depression    Hypertension    Ischemic cardiomyopathy    a. 04/2020 s/p MDT MRI compatible CRT-D (Ser # [id number]); b. 02/2022 Echo: EF 25-30%.   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   Seizures (HCC)    childhood   Stroke [facility])    Tobacco abuse     Past Surgical History:  Procedure Laterality Date   BIV UPGRADE N/A 05/05/2020   Procedure: BIV ICD UPGRADE;  Surgeon: Duke Salvia, MD;  Location: [facility] INVASIVE CV LAB;  Service: Cardiovascular;  Laterality: N/A;   CORONARY ARTERY BYPASS GRAFT     CORONARY PRESSURE/FFR STUDY N/A 04/25/2022   Procedure: INTRAVASCULAR PRESSURE WIRE/FFR STUDY;  Surgeon: Yvonne Kendall, MD;  Location: [facility] INVASIVE CV LAB;  Service: Cardiovascular;  Laterality: N/A;   RIGHT/LEFT HEART CATH AND CORONARY/GRAFT ANGIOGRAPHY N/A 04/25/2022   Procedure: RIGHT/LEFT HEART CATH AND CORONARY/GRAFT ANGIOGRAPHY;  Surgeon: Yvonne Kendall, MD;  Location: [facility] INVASIVE CV LAB;  Service: Cardiovascular;  Laterality: N/A;    Current Medications: Current Meds  Medication Sig   acetaminophen (TYLENOL) 500 MG tablet Take 500 mg by mouth every 6 (six) hours as needed for mild pain.   albuterol (PROVENTIL) (2.5 MG/3ML) 0.083% nebulizer solution Take 3 mLs (2.5 mg total) by nebulization every 6 (six) hours as needed for wheezing or shortness of breath.   albuterol (VENTOLIN HFA) 108 (90 Base) MCG/ACT inhaler Inhale 2 puffs into the lungs every 6 (six) hours as needed for wheezing.   amiodarone (PACERONE) 400 MG tablet Take 400 mg by mouth daily.   apixaban (ELIQUIS) 5 MG TABS tablet TAKE 1 TABLET BY MOUTH TWICE A DAY (Patient taking differently: Take 5 mg by mouth 2 (two) times daily.)   CHLORASEPTIC 1.4 % LIQD Use as directed 1 spray in the mouth or throat as needed.   divalproex (DEPAKOTE ER) 500 MG 24 hr tablet Take 1 tablet (500 mg total) by mouth 2 (two) times daily.   escitalopram (LEXAPRO) 10 MG tablet Take 1 tablet (10 mg total) by mouth at bedtime.   ezetimibe (ZETIA) 10 MG tablet Take 1 tablet (10 mg total) by mouth  daily.   HYDROcodone-acetaminophen (NORCO/VICODIN) 5-325 MG tablet Take 1 tablet by mouth every 6 (six) hours as needed for severe pain.   Hydrocortisone Acetate 1 % CREA Apply 1 Application topically 3 (three) times daily as needed (itching).   levETIRAcetam (KEPPRA) 250 MG tablet Take 1 tablet (250 mg total) by mouth 2 (two) times daily.   levothyroxine (SYNTHROID) 50 MCG tablet Take 50 mcg by mouth every morning.   losartan (COZAAR) 25 MG tablet Take 12.5 mg by mouth daily.   midodrine (PROAMATINE) 5 MG tablet Take 1 tablet (5 mg total) by mouth 3 (three) times daily with meals.   Multiple Vitamin (MULTIVITAMIN WITH MINERALS) TABS tablet Take 1 tablet by mouth daily.   nitroGLYCERIN (NITROSTAT) 0.4 MG SL tablet Place  1 tablet (0.4 mg total) under the tongue every 5 (five) minutes as needed for chest pain.   omeprazole (PRILOSEC) 20 MG capsule Take 20 mg by mouth daily.   potassium chloride (KLOR-CON M) 10 MEQ tablet Take 4 tablets (40 mEq total) by mouth daily.   QUEtiapine (SEROQUEL) 200 MG tablet Take 200 mg by mouth at bedtime.   rosuvastatin (CRESTOR) 10 MG tablet Take 1 tablet (10 mg total) by mouth daily. (Patient taking differently: Take 10 mg by mouth at bedtime.)   spironolactone (ALDACTONE) 25 MG tablet Take 1 tablet (25 mg total) by mouth daily.   tiotropium (SPIRIVA) 18 MCG inhalation capsule Place 18 mcg into inhaler and inhale daily.   Torsemide 60 MG TABS Take 60 mg by mouth daily.   traMADol (ULTRAM) 50 MG tablet Take 50 mg by mouth every 12 (twelve) hours as needed for moderate pain.   [DISCONTINUED] isosorbide mononitrate (IMDUR) 30 MG 24 hr tablet Take 0.5 tablets (15 mg total) by mouth daily.   [DISCONTINUED] midodrine (PROAMATINE) 2.5 MG tablet Take 2.5 mg by mouth 3 (three) times daily with meals.     Allergies:   Iodinated contrast media, Penicillins, Strawberry extract, Cefepime, Erythromycin, and Sulfa antibiotics   Social History   Socioeconomic History    Marital status: Widowed    Spouse name: Not on file   Number of children: Not on file   Years of education: Not on file   Highest education level: Not on file  Occupational History   Not on file  Tobacco Use   Smoking status: Every Day    Packs/day: 0.25    Years: 0.00    Additional pack years: 0.00    Total pack years: 0.00    Types: Cigarettes   Smokeless tobacco: Never  Vaping Use   Vaping Use: Never used  Substance and Sexual Activity   Alcohol use: Not Currently   Drug use: Never   Sexual activity: Not Currently    Birth control/protection: Abstinence  Other Topics Concern   Not on file  Social History Narrative   Not on file   Social Determinants of Health   Financial Resource Strain: Not on file  Food Insecurity: No Food Insecurity (09/20/2022)   Hunger Vital Sign    Worried About Running Out of Food in the Last Year: Never true    Ran Out of Food in the Last Year: Never true  Transportation Needs: No Transportation Needs (09/20/2022)   PRAPARE - Administrator, Civil Service (Medical): No    Lack of Transportation (Non-Medical): No  Physical Activity: Not on file  Stress: Not on file  Social Connections: Not on file     Family History: The patient's family history includes Cancer in his mother; Prostate cancer in his father.  ROS:   Please see the history of present illness.     All other systems reviewed and are negative.  EKGs/Labs/Other Studies Reviewed:    The following studies were reviewed today:  Device interrogation 11/01/22 Scheduled remote reviewed. Normal device function.   Previously documented, CLE:  ED to hospital admission  Alert for sustained VT with successful HV therapy  Event occurred 4/14 @ 17:36, EGM shows onset of sustained VT, mean HR 150, ATP delivered x6 with no changes, followed by HV therapy @ 35J converting to regular AP/BiV pace.  Duration 37sec.  There is one NSVT @ 11:10 and 1 monitored VT @ 17:35  HF  diagnostics currently abnormal  Next remote 91 days.  LA, CVRS   Echo limited 09/20/22  1. Left ventricular ejection fraction, by estimation, is <20%. Left  ventricular ejection fraction by 2D MOD biplane is 17.5 %. The left  ventricle has severely decreased function. The left ventricle demonstrates  global hypokinesis, anterior/anteroseptal  wall best preserved. The left ventricular internal cavity size was  moderately to severely dilated.   2. Right ventricular systolic function is low normal. The right  ventricular size is mildly enlarged. There is mildly elevated pulmonary  artery systolic pressure. The estimated right ventricular systolic  pressure is 40.5 mmHg.   3. Left atrial size was moderately dilated.   4. The mitral valve is degenerative. Moderate to severe mitral valve  regurgitation. Moderate mitral annular calcification. Gradient not  measured, unable to exclude mild stenosis.   5. The aortic valve has an indeterminant number of cusps. Aortic valve  regurgitation is not visualized. No aortic stenosis is present.   Echo 08/04/22 1. Left ventricular ejection fraction, by estimation, is 25 to 30%. The  left ventricle has severely decreased function. The left ventricle  demonstrates regional wall motion abnormalities (see scoring  diagram/findings for description). The left  ventricular internal cavity size was moderately dilated. Left ventricular  diastolic function could not be evaluated.   2. Right ventricular systolic function is normal. The right ventricular  size is normal. There is normal pulmonary artery systolic pressure. The  estimated right ventricular systolic pressure is 31.9 mmHg.   3. Left atrial size was severely dilated.   4. The mitral valve has been repaired/replaced. Severe mitral valve  regurgitation. Mild mitral stenosis. The mean mitral valve gradient is 5.5  mmHg with average heart rate of 66 bpm.   5. The aortic valve is tricuspid. There is mild  thickening of the aortic  valve. Aortic valve regurgitation is not visualized. Aortic valve  sclerosis is present, with no evidence of aortic valve stenosis.   6. The inferior vena cava is normal in size with greater than 50%  respiratory variability, suggesting right atrial pressure of 3 mmHg.   Comparison(s): No significant change from prior study. Prior images  reviewed side by side. Mitral insufficiency was underestimated on the  previous report.   EKG:  EKG is ordered today.  The ekg ordered today demonstrates V paced rhythm, 103 bpm, wide QRS , Qtc , RBBB  Recent Labs: 09/19/2022: ALT 19; B Natriuretic Peptide 1,582.0 09/21/2022: TSH 10.400 11/01/2022: BUN 26; Creatinine, Ser 2.30; Hemoglobin 13.3; Magnesium 2.1; Platelets 142; Potassium 4.1; Sodium 132  Recent Lipid Panel    Component Value Date/Time   CHOL 132 03/28/2022 0728   TRIG 107 03/28/2022 0728   HDL 44 03/28/2022 0728   CHOLHDL 3.0 03/28/2022 0728   VLDL 21 03/28/2022 0728   LDLCALC 67 03/28/2022 0728     Physical Exam:    VS:  BP (!) 76/60 (BP Location: Left Arm, Patient Position: Sitting, Cuff Size: Normal)   Pulse (!) 103   SpO2 96%     Wt Readings from Last 3 Encounters:  09/24/22 176 lb 12.9 oz (80.2 kg)  09/17/22 181 lb 14.1 oz (82.5 kg)  09/10/22 182 lb (82.6 kg)     GEN:  Well nourished, well developed in no acute distress HEENT: Normal NECK: No JVD; No carotid bruits LYMPHATICS: No lymphadenopathy CARDIAC: tachycardia, RR, no murmurs, rubs, gallops RESPIRATORY:  Clear to auscultation without rales, wheezing or rhonchi  ABDOMEN: Soft, non-tender, non-distended MUSCULOSKELETAL:  No edema; No  deformity  SKIN: Warm and dry NEUROLOGIC:  Alert and oriented x 3 PSYCHIATRIC:  Normal affect   ASSESSMENT:    1. VT (ventricular tachycardia) (HCC)   2. ICD (implantable cardioverter-defibrillator) in place   3. Chronic combined systolic and diastolic heart failure (HCC)   4. Ischemic  cardiomyopathy   5. Shortness of breath   6. Medication management   7. Coronary artery disease involving native coronary artery of native heart without angina pectoris   8. PAF (paroxysmal atrial fibrillation) (HCC)    PLAN:    In order of problems listed above:  VT s/p ICD shock -recent ER visit for ICD shock. Interrogation showed sustained VT with successful therapy. - he is on amiodarone 400mg  daily - mexiletine is not on med list - CMET and CBC today - EKG shows V paced rhythm with HR 103bpm and wide QRS, qtc . Baseline HR in the 60s. We need to request device interrogation - We will have him see EP next week - patient should be transitioning to hospice  Chronic reduced EF ICM - limited echo 3/15 LVEF<20%, low normal RVSF, mildly elevated pulmonary artery systolic pressure, mod to severe MR - echo 08/04/22 showed LVEF 25-30%, severe MR, mild stenosis, mean MV gradient 5.99mmHg.  -Pressure is low and heart rate is higher than normal, may have degree of decompensation, however he appears euvolemic on exam - Torsemide 60mg  daily - spironolactone 25mg  daily - Losartan 12.5mg  daily - Toprol 12.5mg  daily - Imdur 15mg  daily - Given low BP, I will stop Imdur and increase midodrine to 5mg  TID - I will refer back to [facility] team for any further recommendations  CAD - No chest pain reported - cath October 2023 showed 2/2 patent grafts with moderate RCA ISR and distal bifurcation disease with normal iFR.  - HS trop minimally elevated in the ER in the setting of VT - no further ischemic work-up at this time  Paroxysmal Afib - continue Eliquis 5mg  BID - continue Toprol  CKD stage 3 - Scr 2.3 in the ER - update CMET  Disposition: Follow up in 1 week(s) with MD   Signed, Guinevere Stephenson David Stall, PA-C  11/08/2022 12:41 PM    [facility]

## 2022-11-08 NOTE — Assessment & Plan Note (Signed)
Most likely secondary to overdiuresis versus cardiorenal syndrome Patient has a baseline serum creatinine of 2.30 and today on admission it is 3.83 Hold losartan, spironolactone and torsemide Gentle IV fluid hydration Repeat renal parameters in a.m.

## 2022-11-08 NOTE — Assessment & Plan Note (Signed)
Last known LVEF from a 2D echocardiogram which was done 03/24 was less than 20%. Hold losartan and spironolactone due to AKI and hold metoprolol due to relative hypotension

## 2022-11-08 NOTE — ED Notes (Signed)
First Nurse Note: Pt to ED via [facility] from [facility]. Pt saw cardiology this morning. Cardiology called facility this afternoon and said that pt needed to be sent out to the ED to be evaluated. Staff at [facility] reports pts has been more fatigue recently. EMS reports pt kept falling asleep with them and appears that his mental status has gotten worse during transport.

## 2022-11-08 NOTE — Progress Notes (Signed)
Called and spoke to facility to inform that Fransico Michael, PA-C wanted to have pt's EP appt moved up and that has been rescheduled for 11/14/22 @11am . Person at facility states that they understand and read back the details. Per Cadence, we can do CMET and CBC next week at that appt, rather than have pt come back here prior. Asked the facility to assist the patient in sending [place] in a remote transmission. States they will and will call back if any questions.

## 2022-11-08 NOTE — Telephone Encounter (Signed)
Opening error 

## 2022-11-08 NOTE — Assessment & Plan Note (Signed)
Will place patient on IV Keppra Seizure precautions

## 2022-11-08 NOTE — Assessment & Plan Note (Addendum)
Patient noted to be very lethargic and arouses only to verbal stimuli He was brought into the ER for evaluation and was found to be tachypneic and hypotensive.  Blood pressure responded to gentle IV fluid resuscitation Imaging shows minimal patchy airspace opacities in the right upper lobe, which could be infectious or inflammatory. Findings are concerning for possible aspiration pneumonia Strict aspiration precautions Continue Azactam since patient has penicillin allergy

## 2022-11-09 ENCOUNTER — Inpatient Hospital Stay

## 2022-11-09 DIAGNOSIS — J690 Pneumonitis due to inhalation of food and vomit: Secondary | ICD-10-CM | POA: Diagnosis not present

## 2022-11-09 DIAGNOSIS — N179 Acute kidney failure, unspecified: Secondary | ICD-10-CM | POA: Diagnosis not present

## 2022-11-09 DIAGNOSIS — R627 Adult failure to thrive: Secondary | ICD-10-CM

## 2022-11-09 LAB — CBC
HCT: 42.5 % (ref 39.0–52.0)
Hemoglobin: 14 g/dL (ref 13.0–17.0)
MCH: 29.2 pg (ref 26.0–34.0)
MCHC: 32.9 g/dL (ref 30.0–36.0)
MCV: 88.7 fL (ref 80.0–100.0)
Platelets: 144 10*3/uL — ABNORMAL LOW (ref 150–400)
RBC: 4.79 MIL/uL (ref 4.22–5.81)
RDW: 17.7 % — ABNORMAL HIGH (ref 11.5–15.5)
WBC: 5.3 10*3/uL (ref 4.0–10.5)
nRBC: 0.8 % — ABNORMAL HIGH (ref 0.0–0.2)

## 2022-11-09 LAB — BASIC METABOLIC PANEL
Anion gap: 12 (ref 5–15)
BUN: 75 mg/dL — ABNORMAL HIGH (ref 8–23)
CO2: 18 mmol/L — ABNORMAL LOW (ref 22–32)
Calcium: 8.9 mg/dL (ref 8.9–10.3)
Chloride: 100 mmol/L (ref 98–111)
Creatinine, Ser: 3.75 mg/dL — ABNORMAL HIGH (ref 0.61–1.24)
GFR, Estimated: 17 mL/min — ABNORMAL LOW (ref >60)
Glucose, Bld: 112 mg/dL — ABNORMAL HIGH (ref 70–99)
Potassium: 5.5 mmol/L — ABNORMAL HIGH (ref 3.5–5.1)
Sodium: 130 mmol/L — ABNORMAL LOW (ref 135–145)

## 2022-11-09 LAB — CBG MONITORING, ED
Glucose-Capillary: 107 mg/dL — ABNORMAL HIGH (ref 70–99)
Glucose-Capillary: 99 mg/dL (ref 70–99)
Glucose-Capillary: 120 mg/dL — ABNORMAL HIGH (ref 70–99)

## 2022-11-09 LAB — URINALYSIS, ROUTINE W REFLEX MICROSCOPIC
Bacteria, UA: NONE SEEN
Bilirubin Urine: NEGATIVE
Glucose, UA: NEGATIVE mg/dL
Ketones, ur: NEGATIVE mg/dL
Nitrite: NEGATIVE
Protein, ur: NEGATIVE mg/dL
RBC / HPF: >50 RBC/hpf (ref 0–5)
Specific Gravity, Urine: 1.011 (ref 1.005–1.030)
WBC, UA: >50 WBC/hpf (ref 0–5)
pH: 5 (ref 5.0–8.0)

## 2022-11-09 LAB — T4, FREE: Free T4: 0.59 ng/dL — ABNORMAL LOW (ref 0.61–1.12)

## 2022-11-09 LAB — CK: Total CK: 276 U/L (ref 49–397)

## 2022-11-09 LAB — AMMONIA: Ammonia: 65 umol/L — ABNORMAL HIGH (ref 9–35)

## 2022-11-09 LAB — BLOOD GAS, VENOUS
Acid-base deficit: 5.6 mmol/L — ABNORMAL HIGH (ref 0.0–2.0)
Bicarbonate: 21.1 mmol/L (ref 20.0–28.0)
O2 Saturation: 16.5 %
Patient temperature: 37
pCO2, Ven: 45 mmHg (ref 44–60)
pH, Ven: 7.28 (ref 7.25–7.43)
pO2, Ven: <31 mmHg — CL (ref 32–45)

## 2022-11-09 LAB — PROCALCITONIN: Procalcitonin: <0.1 ng/mL

## 2022-11-09 LAB — GLUCOSE, CAPILLARY: Glucose-Capillary: 96 mg/dL (ref 70–99)

## 2022-11-09 LAB — TSH: TSH: 27.124 u[IU]/mL — ABNORMAL HIGH (ref 0.350–4.500)

## 2022-11-09 LAB — CORTISOL-AM, BLOOD: Cortisol - AM: 31.7 ug/dL — ABNORMAL HIGH (ref 6.7–22.6)

## 2022-11-09 LAB — POTASSIUM: Potassium: 4.6 mmol/L (ref 3.5–5.1)

## 2022-11-09 MED ORDER — INSULIN ASPART 100 UNIT/ML IV SOLN
5.0000 [IU] | Freq: Once | INTRAVENOUS | Status: AC
  Administered 2022-11-09: 5 [IU] via INTRAVENOUS
  Filled 2022-11-09: qty 0.05

## 2022-11-09 MED ORDER — SODIUM POLYSTYRENE SULFONATE 15 GM/60ML PO SUSP
15.0000 g | Freq: Once | ORAL | Status: AC
  Administered 2022-11-09: 15 g via RECTAL
  Filled 2022-11-09: qty 60

## 2022-11-09 MED ORDER — FUROSEMIDE 10 MG/ML IJ SOLN: 40.0000 mg | Freq: Once | INTRAMUSCULAR | Status: DC

## 2022-11-09 MED ORDER — LACTULOSE ENEMA
300.0000 mL | Freq: Once | ORAL | Status: AC
  Administered 2022-11-09: 300 mL via RECTAL
  Filled 2022-11-09: qty 300

## 2022-11-09 MED ORDER — LACTULOSE ENEMA
300.0000 mL | Freq: Once | ORAL | Status: DC
  Filled 2022-11-09: qty 300

## 2022-11-09 MED ORDER — SODIUM CHLORIDE 0.9 % IV SOLN
2.0000 g | Freq: Every day | INTRAVENOUS | Status: DC
  Administered 2022-11-09 – 2022-11-10 (×2): 2 g via INTRAVENOUS
  Filled 2022-11-09 (×3): qty 20

## 2022-11-09 MED ORDER — DEXTROSE 50 % IV SOLN
1.0000 | Freq: Once | INTRAVENOUS | Status: AC
  Administered 2022-11-09: 50 mL via INTRAVENOUS
  Filled 2022-11-09: qty 50

## 2022-11-09 MED ORDER — LEVOTHYROXINE SODIUM 100 MCG/5ML IV SOLN
50.0000 ug | Freq: Every day | INTRAVENOUS | Status: DC
  Administered 2022-11-09 – 2022-11-11 (×3): 50 ug via INTRAVENOUS
  Filled 2022-11-09 (×3): qty 5

## 2022-11-09 MED ORDER — SODIUM CHLORIDE 0.9 % IV BOLUS
250.0000 mL | Freq: Once | INTRAVENOUS | Status: AC
  Administered 2022-11-09: 250 mL via INTRAVENOUS

## 2022-11-09 NOTE — Progress Notes (Signed)
PROGRESS NOTE    Carsyn Nufer  ZOX:[id number] DOB: 09/22/1960 DOA: 11/08/2022 PCP: System, Provider Not In   Assessment & Plan:   Principal Problem:   AKI (acute kidney injury) (HCC) Active Problems:   Aspiration pneumonia (HCC)   Hyponatremia   Acute metabolic encephalopathy   Hypothyroidism   Chronic combined systolic and diastolic heart failure (HCC)   Bipolar 1 disorder (HCC)   Paroxysmal atrial fibrillation (HCC)   History of seizure   Ischemic cardiomyopathy  Assessment and Plan: Failure to thrive: secondary to all below. Very poor prognosis. 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 Cr [place] trending down slightly from day prior.  Holding losartan, aldactone & torsemide. Continue on IVFs   Aspiration pneumonia: continue on IV rocephin. Pt has penicillin allergy so unable to give unasyn. Continue on bronchodilators. Speech consulted    Hyponatremia: continue on IVFs. Possibly secondary to diuretic use  Hyperkalemia: resolved  Acute metabolic encephalopathy: etiology unclear, hx of CVAs. CT head shows no acute intracranial abnormalities. Baseline mental status is unknown    Chronic combined systolic and diastolic heart failure: echo EF < 20% in 3/24. Holding home metoprolol, losartan, aldactone & torsemide secondary to soft BP and AKI. Very poor prognosis   Hypothyroidism: continue on IV levothyroxine    Bipolar 1 disorder: unknown type or severity. Holding home dose of seroquel until speech can eval pt    PAF: holding metoprolol for soft BP. Hold eliquis    Hx of seizures: continue on keppra. Seizure precautions    Ischemic cardiomyopathy: echo 03/24  EF < 20%. Holding losartan, aldactone secondary to AKI. Hold metoprolol secondary to soft BP   Thrombocytopenia: etiology unclear. Will continue to monitor    DVT prophylaxis: heparin SQ Code Status:  DNR Family Communication: Disposition  Plan: likely d/c back to group home   Level of care: Progressive Status is: Inpatient Remains inpatient appropriate because: severity of illness    Consultants:    Procedures:  Antimicrobials: rocephin    Subjective: Pt is unable to answer questions appropriately   Objective: Vitals:   11/09/22 0600 11/09/22 1119 11/09/22 1122 11/09/22 1559  BP: 98/77 91/70    Pulse: 76 61    Resp: (!) 23 20    Temp: (!) 97.5 F (36.4 C)  (!) 97.5 F (36.4 C) (!) 97.5 F (36.4 C)  TempSrc: Axillary  Oral Axillary  SpO2: 98% 99%    Weight:      Height:        Intake/Output Summary (Last 24 hours) at 11/09/2022 1615 Last data filed at 11/09/2022 1500 Gross per 24 hour  Intake 100 ml  Output 700 ml  Net -600 ml   Filed Weights   11/08/22 1951  Weight: 81.7 kg    Examination:  General exam: Appears uncomfortable  Respiratory system: Course breath sounds b/l  Cardiovascular system: S1 & S2 +. No rubs, gallops or clicks. Gastrointestinal system: Abdomen is nondistended, soft and nontender.  Normal bowel sounds heard. Central nervous system: Alert and awake. Does not follow simple commands Psychiatry: Judgement and insight appears poor. Flat mood and affect     Data Reviewed: I have personally reviewed following labs and imaging studies  CBC: Recent Labs  Lab 11/08/22 1811 11/09/22 0338  WBC 5.6 5.3  HGB 13.5 14.0  HCT 40.0 42.5  MCV 86.2 88.7  PLT 162 144*   Basic Metabolic Panel: Recent Labs  Lab 11/08/22 1811 11/08/22 1822 11/09/22 0338 11/09/22 1502  NA 128*  --  130*  --   K 5.0  --  5.5* 4.6  CL 93*  --  100  --   CO2 19*  --  18*  --   GLUCOSE 119*  --  112*  --   BUN 73*  --  75*  --   CREATININE 3.83*  --  3.75*  --   CALCIUM 9.6  --  8.9  --   MG  --  2.5*  --   --    GFR: Estimated Creatinine Clearance: 20.2 mL/min (A) (by C-G formula based on SCr of 3.75 mg/dL (H)). Liver Function Tests: Recent Labs  Lab 11/08/22 1822  AST 54*  ALT 31   ALKPHOS 114  BILITOT 2.1*  PROT 7.5  ALBUMIN 3.6   No results for input(s): "LIPASE", "AMYLASE" in the last 168 hours. Recent Labs  Lab 11/09/22 0009  AMMONIA 65*   Coagulation Profile: No results for input(s): "INR", "PROTIME" in the last 168 hours. Cardiac Enzymes: Recent Labs  Lab 11/09/22 0544  CKTOTAL 276   BNP (last 3 results) No results for input(s): "PROBNP" in the last 8760 hours. HbA1C: No results for input(s): "HGBA1C" in the last 72 hours. CBG: Recent Labs  Lab 11/09/22 0409 11/09/22 1503  GLUCAP 107* 99   Lipid Profile: No results for input(s): "CHOL", "HDL", "LDLCALC", "TRIG", "CHOLHDL", "LDLDIRECT" in the last 72 hours. Thyroid Function Tests: Recent Labs    11/09/22 0338  TSH 27.124*  FREET4 0.59*   Anemia Panel: No results for input(s): "VITAMINB12", "FOLATE", "FERRITIN", "TIBC", "IRON", "RETICCTPCT" in the last 72 hours. Sepsis Labs: Recent Labs  Lab 11/09/22 0338  PROCALCITON <0.10    No results found for this or any previous visit (from the past 240 hour(s)).       Radiology Studies: CT HEAD WO CONTRAST ( )  Result Date: 11/09/2022 CLINICAL DATA:  Weakness and confusion. Mental status change of unknown cause. EXAM: CT HEAD WITHOUT CONTRAST TECHNIQUE: Contiguous axial images were obtained from the base of the skull through the vertex without intravenous contrast. RADIATION DOSE REDUCTION: This exam was performed according to the departmental dose-optimization program which includes automated exposure control, adjustment of the mA and/or kV according to patient size and/or use of iterative reconstruction technique. COMPARISON:  05/22/2022 FINDINGS: Brain: No apparent change. Generalized atrophy. Old infarction in the left temporal and parietal lobe consistent with inferior division left MCA infarction. Chronic small-vessel ischemic changes elsewhere within the hemispheric white matter. Chronic small-vessel ischemic changes of the pons. No  sign of acute infarction, mass lesion, hemorrhage, hydrocephalus or extra-axial collection. Vascular: There is atherosclerotic calcification of the major vessels at the base of the brain. Skull: No skull fracture. Sinuses/Orbits: Fluid and or retention cyst in the left maxillary sinus. Partial aeration, improved when compared to the study of November. The other sinuses are clear. Orbits negative. Other: None IMPRESSION: 1. No acute CT finding. Atrophy and chronic small-vessel ischemic changes. 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 2. Fluid and or retention cyst in the left maxillary sinus. Partial aeration, improved when compared to the study of November. Electronically Signed   By: Paulina Fusi M.D.   On: 11/09/2022 11:09   DG Chest 1 View  Result Date: 11/08/2022 CLINICAL DATA:  Chest pain EXAM: CHEST  1 VIEW COMPARISON:  Chest x-ray 11/01/2022 FINDINGS: Left-sided ICD again noted. Prosthetic heart valve and median sternotomy wires are again seen. The heart  is enlarged. There central pulmonary vascular congestion. There some minimal patchy airspace opacities in the right upper lobe. There is no pleural effusion or pneumothorax. No acute fractures are seen. IMPRESSION: 1. Cardiomegaly with central pulmonary vascular congestion. 2. Minimal patchy airspace opacities in the right upper lobe, which could be infectious or inflammatory. Electronically Signed   By: Darliss Cheney M.D.   On: 11/08/2022 18:40        Scheduled Meds:  heparin  5,000 Units Subcutaneous Q8H   levothyroxine  50 mcg Intravenous Daily   Continuous Infusions:  cefTRIAXone (ROCEPHIN)  IV Stopped (11/09/22 1257)   levETIRAcetam Stopped (11/09/22 0038)     LOS: 1 day    Time spent: 35 mins     Charise Killian, MD [facility] Pager [phone]  If 7PM-7AM, please contact night-coverage [email or web address] 11/09/2022, 4:15 PM

## 2022-11-09 NOTE — ED Notes (Addendum)
Pt had deficated on self. Pt cleaned and placed in new brief. Bed sheet, blankets, and pads replaced.

## 2022-11-09 NOTE — Progress Notes (Signed)
       CROSS COVER NOTE  NAME: Carl [name] MRN: [id number] DOB : 12/18/1960    HPI/Events of Note   Report:patient with continued intermittent real low pressure readings. Still remains lethargic with inability to demonstrate safe swallow to be given ordered midodrine  On review of chart:Patient sent from cardiology clinic as PPM reading showed slow v tach. On arrival he was lethargic and hypotensive which has been ongoing and progressively worsening since last admission.  He was referred to hospice but initial evaluation deemed him ineligible and ongoing discussions per ER MD record states hospice has been going back and fourth.  Pertinent history includes heart failure (combined) with reduced EF 20% and ppm AICD ischemic cardiomyopathy CAD with CABG and post stent angioplasty. Currently labs show AKI, hyperbilirubinemia elevated ammonia hyponatremia and metabolic acidosis.  TSH from 3/16 above 10 with  T4 in normal range    Assessment and  Interventions   Assessment: Mentation actually notable improvement while at bedside. Did tell us he wanted a chocolate and white milk. Speech slightly difficult to understand bt believe this to be his baseline. Had a decrease oxygen sats with sat monitor 47, asymptomatic, patient without change in color and better mentation. Sats 100% on 2 L [place]. Patient said yes to using oxygen at home but no one available to validate. Paced rhythm on monitor looks like EKG done on arrival and redone now. Cardiology following. 200 uop Plan: BMP K 5.6 - 250 NS bolus 1 gm calcium gluconate, 5 units insulin f/b D50 amp, kayexalate enema Creatinine slightly improved  VBG - no acidosis  Ammonia 65 - still not safe to swallow, lactulose enema ordered TSH 27,124, T4 0.59 = would increase daily dose to 75 mcg but will order IV dose at 50 since he is npo and can be increased when able to take po Cortisol level has been added to labs Procal still pending Consider inpatient  palliative/comfort care if he continues to decline        Donnie Mesa NP [facility]

## 2022-11-09 NOTE — Progress Notes (Signed)
Pharmacy Antibiotic Note  Carl [name] is a 62 y.o. male admitted on 11/08/2022 with aspiration pneumonia.  Pharmacy has been consulted for Aztreonam dosing.  Plan: Pt given initial dose of Aztreonam 2 gm in ED. Aztreonam 1 gm q8hr for 5 days per indication & renal fxn.  Pharmacy will continue to follow and will adjust abx dosing whenever warranted.  Temp (24hrs), Avg:97.8 F (36.6 C), Min:97.8 F (36.6 C), Max:97.8 F (36.6 C)   Recent Labs  Lab 11/08/22 1811  WBC 5.6  CREATININE 3.83*    Estimated Creatinine Clearance: 19.8 mL/min (A) (by C-G formula based on SCr of 3.83 mg/dL (H)).    Allergies  Allergen Reactions   Iodinated Contrast Media Shortness Of Breath   Penicillins Anaphylaxis and Shortness Of Breath    Respiratory  Tolerated cefuroxime on 01/06/16   Strawberry Extract Anaphylaxis   Cefepime Itching    Empiric antibiotic, developed pruritis.    Erythromycin Itching   Sulfa Antibiotics Itching, Nausea And Vomiting and Nausea Only    Antimicrobials this admission: 5/03 Aztreonam >> x 5 days 5/03 Vancomycin >> x 1 dose  Microbiology results: No lab cx currently ordered or pending at this time.  Thank you for allowing pharmacy to be a part of this patient's care.  Otelia Sergeant, PharmD, MBA 11/09/2022 12:01 AM

## 2022-11-09 NOTE — ED Notes (Signed)
Request made for transport to the floor ?

## 2022-11-10 DIAGNOSIS — J690 Pneumonitis due to inhalation of food and vomit: Secondary | ICD-10-CM | POA: Diagnosis not present

## 2022-11-10 DIAGNOSIS — N179 Acute kidney failure, unspecified: Secondary | ICD-10-CM | POA: Diagnosis not present

## 2022-11-10 DIAGNOSIS — R627 Adult failure to thrive: Secondary | ICD-10-CM | POA: Diagnosis not present

## 2022-11-10 DIAGNOSIS — N189 Chronic kidney disease, unspecified: Secondary | ICD-10-CM

## 2022-11-10 DIAGNOSIS — R531 Weakness: Secondary | ICD-10-CM

## 2022-11-10 LAB — CBC
HCT: 44.7 % (ref 39.0–52.0)
Hemoglobin: 14.4 g/dL (ref 13.0–17.0)
MCH: 29.2 pg (ref 26.0–34.0)
MCHC: 32.2 g/dL (ref 30.0–36.0)
MCV: 90.7 fL (ref 80.0–100.0)
Platelets: 120 10*3/uL — ABNORMAL LOW (ref 150–400)
RBC: 4.93 MIL/uL (ref 4.22–5.81)
RDW: 18.5 % — ABNORMAL HIGH (ref 11.5–15.5)
WBC: 8.6 10*3/uL (ref 4.0–10.5)
nRBC: 1.3 % — ABNORMAL HIGH (ref 0.0–0.2)

## 2022-11-10 LAB — BASIC METABOLIC PANEL
Anion gap: 17 — ABNORMAL HIGH (ref 5–15)
BUN: 81 mg/dL — ABNORMAL HIGH (ref 8–23)
CO2: 16 mmol/L — ABNORMAL LOW (ref 22–32)
Calcium: 9.5 mg/dL (ref 8.9–10.3)
Chloride: 103 mmol/L (ref 98–111)
Creatinine, Ser: 3.68 mg/dL — ABNORMAL HIGH (ref 0.61–1.24)
GFR, Estimated: 18 mL/min — ABNORMAL LOW (ref >60)
Glucose, Bld: 97 mg/dL (ref 70–99)
Potassium: 5.5 mmol/L — ABNORMAL HIGH (ref 3.5–5.1)
Sodium: 136 mmol/L (ref 135–145)

## 2022-11-10 LAB — GLUCOSE, CAPILLARY
Glucose-Capillary: 108 mg/dL — ABNORMAL HIGH (ref 70–99)
Glucose-Capillary: 64 mg/dL — ABNORMAL LOW (ref 70–99)
Glucose-Capillary: 158 mg/dL — ABNORMAL HIGH (ref 70–99)
Glucose-Capillary: 165 mg/dL — ABNORMAL HIGH (ref 70–99)
Glucose-Capillary: 141 mg/dL — ABNORMAL HIGH (ref 70–99)
Glucose-Capillary: 110 mg/dL — ABNORMAL HIGH (ref 70–99)

## 2022-11-10 MED ORDER — METOPROLOL SUCCINATE ER 25 MG PO TB24
12.5000 mg | ORAL_TABLET | Freq: Every day | ORAL | Status: DC
  Administered 2022-11-11 – 2022-11-17 (×6): 12.5 mg via ORAL
  Filled 2022-11-10 (×6): qty 1

## 2022-11-10 MED ORDER — DEXTROSE 50 % IV SOLN
25.0000 g | Freq: Once | INTRAVENOUS | Status: AC
  Administered 2022-11-10: 25 g via INTRAVENOUS
  Filled 2022-11-10: qty 50

## 2022-11-10 MED ORDER — SODIUM ZIRCONIUM CYCLOSILICATE 10 G PO PACK
10.0000 g | PACK | Freq: Once | ORAL | Status: DC
  Filled 2022-11-10: qty 1

## 2022-11-10 MED ORDER — FUROSEMIDE 10 MG/ML IJ SOLN
40.0000 mg | Freq: Once | INTRAMUSCULAR | Status: AC
  Administered 2022-11-10: 40 mg via INTRAVENOUS
  Filled 2022-11-10: qty 4

## 2022-11-10 MED ORDER — QUETIAPINE FUMARATE 25 MG PO TABS
200.0000 mg | ORAL_TABLET | Freq: Every day | ORAL | Status: DC
  Administered 2022-11-10 – 2022-11-16 (×7): 200 mg via ORAL
  Filled 2022-11-10 (×7): qty 8

## 2022-11-10 NOTE — Progress Notes (Signed)
PROGRESS NOTE    Carl [name]  ZDG:[id number] DOB: [birth year] DOA: 11/08/2022 PCP: System, Provider Not In   Assessment & Plan:   Principal Problem:   AKI (acute kidney injury) (HCC) Active Problems:   Aspiration pneumonia (HCC)   Hyponatremia   Acute metabolic encephalopathy   Hypothyroidism   Chronic combined systolic and diastolic heart failure (HCC)   Bipolar 1 disorder (HCC)   Paroxysmal atrial fibrillation (HCC)   History of seizure   Ischemic cardiomyopathy  Assessment and Plan: Failure to thrive: secondary to all below. Very poor prognosis. 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 Cr is trending down again today.  Holding losartan, aldactone, & torsemide.    Aspiration pneumonia: continue on IV rocephin & bronchodilators. Pt has penicillin allergy so unable to give unasyn. Started on dysphagia I diet as per speech    Hyponatremia: resolved   Hyperkalemia: elevated again today. Lokelma x 1 ordered. IV lasix x 1 ordered.   Acute metabolic encephalopathy: etiology unclear, hx of CVAs. CT head shows no acute intracranial abnormalities. Baseline mental status is unknown    Chronic combined systolic and diastolic heart failure: echo EF < 20% in 3/24. Restarting home metoprolol. Holding losartan, aldactone & torsemide. Very poor prognosis    Hypothyroidism: continue on levothyroxine    Bipolar 1 disorder: unknown type or severity. Will restart home dose of seroquel    PAF: will restart home dose of metoprolol. Hold eliquis    Hx of seizures: continue on keppra. Seizure precautions    Ischemic cardiomyopathy: echo 03/24  EF < 20%. Holding losartan, aldactone secondary to AKI. Hold metoprolol secondary to soft BP   Thrombocytopenia: etiology unclear. Will continue to monitor    DVT prophylaxis: heparin SQ Code Status:  DNR Family Communication: unable to reach legal guardian  Disposition  Plan: likely d/c back to group home   Level of care: Progressive Status is: Inpatient Remains inpatient appropriate because: severity of illness    Consultants:    Procedures:  Antimicrobials: rocephin    Subjective: Pt is unable to answer questions appropriately    Objective: Vitals:   11/09/22 2135 11/09/22 2200 11/09/22 2322 11/10/22 0340  BP: (!) 86/68 119/69 91/61 110/70  Pulse: 73 73 66 66  Resp: (!) 25 (!) 24 20 18   Temp:  97.6 F (36.4 C) 97.7 F (36.5 C) (!) 97.4 F (36.3 C)  TempSrc:  Axillary    SpO2: 99% 98% 100% 100%  Weight:   80.4 kg   Height:   5\' 6"  (1.676 m)     Intake/Output Summary (Last 24 hours) at 11/10/2022 0806 Last data filed at 11/10/2022 5956 Gross per 24 hour  Intake --  Output 850 ml  Net -850 ml   Filed Weights   11/08/22 1951 11/09/22 2322  Weight: 81.7 kg 80.4 kg    Examination:  General exam: Appears comfortable  Respiratory system: Course breath sounds b/l  Cardiovascular system: S1/S2+. No rubs or clicks  Gastrointestinal system: Abd is soft, NT, ND & normal bowel sounds  Central nervous system: alert and awake  Psychiatry: Judgement and insight appears poor. Flat mood and affect    Data Reviewed: I have personally reviewed following labs and imaging studies  CBC: Recent Labs  Lab 11/08/22 1811 11/09/22 0338 11/10/22 0347  WBC 5.6 5.3 8.6  HGB 13.5 14.0 14.4  HCT 40.0 42.5 44.7  MCV 86.2 88.7 90.7  PLT  162 144* 120*   Basic Metabolic Panel: Recent Labs  Lab 11/08/22 1811 11/08/22 1822 11/09/22 0338 11/09/22 1502 11/10/22 0347  NA 128*  --  130*  --  136  K 5.0  --  5.5* 4.6 5.5*  CL 93*  --  100  --  103  CO2 19*  --  18*  --  16*  GLUCOSE 119*  --  112*  --  97  BUN 73*  --  75*  --  81*  CREATININE 3.83*  --  3.75*  --  3.68*  CALCIUM 9.6  --  8.9  --  9.5  MG  --  2.5*  --   --   --    GFR: Estimated Creatinine Clearance: 20.5 mL/min (A) (by C-G formula based on SCr of 3.68 mg/dL (H)). Liver  Function Tests: Recent Labs  Lab 11/08/22 1822  AST 54*  ALT 31  ALKPHOS 114  BILITOT 2.1*  PROT 7.5  ALBUMIN 3.6   No results for input(s): "LIPASE", "AMYLASE" in the last 168 hours. Recent Labs  Lab 11/09/22 0009  AMMONIA 65*   Coagulation Profile: No results for input(s): "INR", "PROTIME" in the last 168 hours. Cardiac Enzymes: Recent Labs  Lab 11/09/22 0544  CKTOTAL 276   BNP (last 3 results) No results for input(s): "PROBNP" in the last 8760 hours. HbA1C: No results for input(s): "HGBA1C" in the last 72 hours. CBG: Recent Labs  Lab 11/09/22 0409 11/09/22 1503 11/09/22 1940 11/09/22 2345 11/10/22 0341  GLUCAP 107* 99 120* 96 108*   Lipid Profile: No results for input(s): "CHOL", "HDL", "LDLCALC", "TRIG", "CHOLHDL", "LDLDIRECT" in the last 72 hours. Thyroid Function Tests: Recent Labs    11/09/22 0338  TSH 27.124*  FREET4 0.59*   Anemia Panel: No results for input(s): "VITAMINB12", "FOLATE", "FERRITIN", "TIBC", "IRON", "RETICCTPCT" in the last 72 hours. Sepsis Labs: Recent Labs  Lab 11/09/22 0338  PROCALCITON <0.10    No results found for this or any previous visit (from the past 240 hour(s)).       Radiology Studies: CT HEAD WO CONTRAST ( )  Result Date: 11/09/2022 CLINICAL DATA:  Weakness and confusion. Mental status change of unknown cause. EXAM: CT HEAD WITHOUT CONTRAST TECHNIQUE: Contiguous axial images were obtained from the base of the skull through the vertex without intravenous contrast. RADIATION DOSE REDUCTION: This exam was performed according to the departmental dose-optimization program which includes automated exposure control, adjustment of the mA and/or kV according to patient size and/or use of iterative reconstruction technique. COMPARISON:  05/22/2022 FINDINGS: Brain: No apparent change. Generalized atrophy. Old infarction in the left temporal and parietal lobe consistent with inferior division left MCA infarction. Chronic  small-vessel ischemic changes elsewhere within the hemispheric white matter. Chronic small-vessel ischemic changes of the pons. No sign of acute infarction, mass lesion, hemorrhage, hydrocephalus or extra-axial collection. Vascular: There is atherosclerotic calcification of the major vessels at the base of the brain. Skull: No skull fracture. Sinuses/Orbits: Fluid and or retention cyst in the left maxillary sinus. Partial aeration, improved when compared to the study of November. The other sinuses are clear. Orbits negative. Other: None IMPRESSION: 1. No acute CT finding. Atrophy and chronic small-vessel ischemic changes. 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 2. Fluid and or retention cyst in the left maxillary sinus. Partial aeration, improved when compared to the study of November. Electronically Signed   By: Paulina Fusi M.D.   On: 11/09/2022 11:09   DG Chest 1 View  Result Date:  11/08/2022 CLINICAL DATA:  Chest pain EXAM: CHEST  1 VIEW COMPARISON:  Chest x-ray 11/01/2022 FINDINGS: Left-sided ICD again noted. Prosthetic heart valve and median sternotomy wires are again seen. The heart is enlarged. There central pulmonary vascular congestion. There some minimal patchy airspace opacities in the right upper lobe. There is no pleural effusion or pneumothorax. No acute fractures are seen. IMPRESSION: 1. Cardiomegaly with central pulmonary vascular congestion. 2. Minimal patchy airspace opacities in the right upper lobe, which could be infectious or inflammatory. Electronically Signed   By: Darliss Cheney M.D.   On: 11/08/2022 18:40        Scheduled Meds:  heparin  5,000 Units Subcutaneous Q8H   levothyroxine  50 mcg Intravenous Daily   sodium zirconium cyclosilicate  10 g Oral Once   Continuous Infusions:  cefTRIAXone (ROCEPHIN)  IV Stopped (11/09/22 1257)   levETIRAcetam 500 mg (11/09/22 2338)     LOS: 2 days    Time spent: 30 mins     Charise Killian, MD [facility] Pager [phone]  If  7PM-7AM, please contact night-coverage [email or web address] 11/10/2022, 8:06 AM

## 2022-11-10 NOTE — Progress Notes (Addendum)
SLP Cancellation Note  Patient Details Name: Carl [name] MRN: [id number] DOB: 1960-11-30   Cancelled treatment:       Reason Eval/Treat Not Completed: Medical issues which prohibited therapy   Chart review completed. Per chart review and discussion with RN, pt with low blood glucose and RN administering D50. Will plan to attempt clinical swallowing evaluation later this AM pending improvements in blood glucose. RN aware and in agreement.  Carl [name], M.S., CCC-SLP Speech-Language Pathologist [facility] [phone] [name])  [name] 11/10/2022, 8:59 AM

## 2022-11-10 NOTE — Progress Notes (Addendum)
[facility] 258 Hospitalized Hospice Patient  Carl [name] is a current hospice patient followed at [name] for Dx hypertensive heart and chronic kidney disease.  (related dx hx of v-tach) who was admitted to [facility] on 5.3.24 with Dx Generalized Weakness (Acute Renal failure and pneumonia too).  He was seen by his cardiologist morning of 5.3.24 and noted to be hypotensive with systolic BP 88.  They remotely downloaded his AICD information which showed he was noted to have slow V-tach.  Cardiologist called facility and told him to go to ED for further evaluation.   Per Dr. Nolon Rod Monguilod, this is a related hospital admission.  Patient is appropriate for GIP for skilled level intervention of continual cardiac monitoring with frequent skilled assessment to monitor effectiveness of cardiac medications and ongoing treatment.  Vital Signs:  T- 97.7  BP-91/61 P-72 R-20  SPO2- 100% on 4L O2 via Carl [name]  ABNORMAL LABS:  Glucose 64- amp D50 given Potassium 5.5, CO2 16, BUN 81, Creat 3.68, GFR 18  IV/[place] MEDICATIONS Keppra IVPB 500mg /174ml daily (history of seizures and had shaking episodes in ED) Rocephin 2g IVPB daily (pneumonia) Levothyroxin IV daily Heparin 5000 unit [facility] every 8 hours  I&O Urine Output- Since admission cumulative Net      Problem list:     Failure to thrive: secondary to all below. Very poor prognosis. High risk for further decline    2.  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 Cr [place] trending down slightly from day prior.  Holding losartan, aldactone & torsemide. Continue on IVFs   3.  Aspiration pneumonia: continue on IV rocephin. Pt has penicillin allergy so unable to give unasyn. Continue on bronchodilators. Speech consulted    4.  Hyponatremia: continue on IVFs. Possibly secondary to diuretic use   5.  Hyperkalemia: resolved   6.  Acute metabolic encephalopathy: etiology unclear, hx of CVAs. CT head  shows no acute intracranial abnormalities. Baseline mental status is unknown    7.  Chronic combined systolic and diastolic heart failure: echo EF < 20% in 3/24. Holding home metoprolol, losartan, aldactone & torsemide secondary to soft BP and AKI. Very poor prognosis   8.  Hypothyroidism: continue on IV levothyroxine    9.  Bipolar 1 disorder: unknown type or severity. Holding home dose of seroquel until speech can eval pt    10.  PAF: holding metoprolol for soft BP. Hold eliquis    11.  Hx of seizures: continue on keppra. Seizure precautions    12.  Ischemic cardiomyopathy: echo 03/24  EF < 20%. Holding losartan, aldactone secondary to AKI. Hold metoprolol secondary to soft BP    13.  Thrombocytopenia: etiology unclear. Will continue to monitor    Discharge Planning:  Ongoing- Family contact:  DSS guardian Carl [name] [phone] IDT:  Updated-  Patient has been full code with [facility].  Joint meeting planned with [facility] IDT and DSS guardian to discuss code status.  Ongoing collaboration between hospital, [facility] and DSS guardian/family to discuss goals of care in setting of patient's decline. Goals of Care-  Patient DNR in hospital. Day 1 only:  Medication list and Transfer Summary placed on Shadow Chart   Carl Cross, RN Nurse Liaison [phone]

## 2022-11-10 NOTE — Progress Notes (Signed)
Palliative consult received.  Review of chart and consulting with Renea Ee, [facility] liaison. Mr. Dennler current and active member with [facility] hospice. Diannia Ruder made aware of admission and will follow through patient hospital stay. Palliative will remain available if needs arise.  No Charge.  Leeanne Deed, DNP, AGNP-C Palliative Medicine  Please call Palliative Medicine team phone with any questions [phone]. For individual providers please see AMION.

## 2022-11-11 DIAGNOSIS — R627 Adult failure to thrive: Secondary | ICD-10-CM | POA: Diagnosis not present

## 2022-11-11 DIAGNOSIS — J690 Pneumonitis due to inhalation of food and vomit: Secondary | ICD-10-CM | POA: Diagnosis not present

## 2022-11-11 DIAGNOSIS — N179 Acute kidney failure, unspecified: Secondary | ICD-10-CM | POA: Diagnosis not present

## 2022-11-11 LAB — CBC
HCT: 41.6 % (ref 39.0–52.0)
Hemoglobin: 13.8 g/dL (ref 13.0–17.0)
MCH: 29.2 pg (ref 26.0–34.0)
MCHC: 33.2 g/dL (ref 30.0–36.0)
MCV: 87.9 fL (ref 80.0–100.0)
Platelets: 116 10*3/uL — ABNORMAL LOW (ref 150–400)
RBC: 4.73 MIL/uL (ref 4.22–5.81)
RDW: 18.1 % — ABNORMAL HIGH (ref 11.5–15.5)
WBC: 9.3 10*3/uL (ref 4.0–10.5)
nRBC: 1.6 % — ABNORMAL HIGH (ref 0.0–0.2)

## 2022-11-11 LAB — BASIC METABOLIC PANEL
Anion gap: 15 (ref 5–15)
BUN: 98 mg/dL — ABNORMAL HIGH (ref 8–23)
CO2: 21 mmol/L — ABNORMAL LOW (ref 22–32)
Calcium: 9.3 mg/dL (ref 8.9–10.3)
Chloride: 103 mmol/L (ref 98–111)
Creatinine, Ser: 3.1 mg/dL — ABNORMAL HIGH (ref 0.61–1.24)
GFR, Estimated: 22 mL/min — ABNORMAL LOW (ref >60)
Glucose, Bld: 108 mg/dL — ABNORMAL HIGH (ref 70–99)
Potassium: 4.4 mmol/L (ref 3.5–5.1)
Sodium: 139 mmol/L (ref 135–145)

## 2022-11-11 LAB — GLUCOSE, CAPILLARY
Glucose-Capillary: 113 mg/dL — ABNORMAL HIGH (ref 70–99)
Glucose-Capillary: 84 mg/dL (ref 70–99)
Glucose-Capillary: 114 mg/dL — ABNORMAL HIGH (ref 70–99)

## 2022-11-11 MED ORDER — DIVALPROEX SODIUM ER 500 MG PO TB24
500.0000 mg | ORAL_TABLET | Freq: Two times a day (BID) | ORAL | Status: DC
  Administered 2022-11-11 – 2022-11-17 (×12): 500 mg via ORAL
  Filled 2022-11-11 (×16): qty 1

## 2022-11-11 MED ORDER — GLYCOPYRROLATE 0.2 MG/ML IJ SOLN
0.2000 mg | INTRAMUSCULAR | Status: DC | PRN
  Administered 2022-11-17: 0.2 mg via SUBCUTANEOUS
  Filled 2022-11-11: qty 1

## 2022-11-11 MED ORDER — GLYCOPYRROLATE 0.2 MG/ML IJ SOLN: 0.2000 mg | INTRAMUSCULAR | Status: DC | PRN

## 2022-11-11 MED ORDER — POLYVINYL ALCOHOL 1.4 % OP SOLN: 1.0000 [drp] | Freq: Four times a day (QID) | OPHTHALMIC | Status: DC | PRN

## 2022-11-11 MED ORDER — MORPHINE SULFATE (PF) 2 MG/ML IV SOLN
2.0000 mg | INTRAVENOUS | Status: DC | PRN
  Filled 2022-11-11: qty 1

## 2022-11-11 MED ORDER — LEVETIRACETAM 250 MG PO TABS
250.0000 mg | ORAL_TABLET | Freq: Two times a day (BID) | ORAL | Status: DC
  Administered 2022-11-12 – 2022-11-17 (×11): 250 mg via ORAL
  Filled 2022-11-11 (×14): qty 1

## 2022-11-11 MED ORDER — GLYCOPYRROLATE 1 MG PO TABS: 1.0000 mg | ORAL_TABLET | ORAL | Status: DC | PRN

## 2022-11-11 MED ORDER — LEVOTHYROXINE SODIUM 50 MCG PO TABS
50.0000 ug | ORAL_TABLET | Freq: Every day | ORAL | Status: DC
  Administered 2022-11-12 – 2022-11-17 (×6): 50 ug via ORAL
  Filled 2022-11-11 (×5): qty 1

## 2022-11-11 NOTE — Discharge Summary (Signed)
Physician Discharge Summary  Paras Carl [name]:[id number] DOB: 21-Feb-1961 DOA: 11/08/2022  PCP: System, Provider Not In  Admit date: 11/08/2022 Discharge date: 11/11/2022  Admitted From: ALF Disposition:  ALF w/ comfort care only   Recommendations for Outpatient Follow-up:  Follow up with hospice provider ASAP   Home Health: no  Equipment/Devices:  Discharge Condition: hospice  CODE STATUS: DNR Diet recommendation: dysphagia I diet as tolerated   Brief/Interim Summary: HPI was taken from Dr. Joylene Igo: Carl [name] is a 62 y.o. male with medical history significant for bipolar disorder, diabetes mellitus with complications of stage III chronic kidney disease, coronary artery disease status post CABG status post stent angioplasty, ischemic cardiomyopathy status post AICD insertion, history of chronic combined systolic and diastolic dysfunction CHF with last known LVEF of 20 percent from a 2D echocardiogram which was done 03/24 , depression, history of paroxysmal A-fib, seizure disorder who was sent to the ER from the group home where he resides for evaluation of slow ventricular tachycardia. Patient was seen at the cardiology clinic on the day of admission and was noted to be hypotensive with systolic blood pressure of 88.  He was noted to be oriented only to person.  His device was remotely downloaded and patient was advised to go to the ER because he was noted to have slow ventricular tachycardia. Upon arrival to the ER he was noted to be very lethargic and oriented only to person, not to place or time. I am unable to do review of systems on this patient due to his altered mental status. Vital signs show a blood pressure of 76/60, tachycardic with heart rate of 103 Abnormal labs included BNP of 3178, sodium 128, creatinine 3.83 above a baseline of 2.30, troponin is flat 72 >> 76 Chest x-ray reviewed by me shows cardiomegaly with central pulmonary vascular congestion. Minimal patchy airspace  opacities in the right upper lobe, which could be infectious or inflammatory. Twelve-lead EKG reviewed by me shows atrial sensed ventricular paced rhythm. Patient received 500 cc of normal saline in the ER as well as a dose of vancomycin and Azactam. He will be admitted to the hospital for further evaluation  D/c delayed secondary to- as per CM: DSS are still not opposed to him going to the ALF but not ok with it either. They want his clinical sent to local SNF's I have told them he is not able to get hospice at a SNF. They want me to send it anyway   Discharge Diagnoses:  Principal Problem:   AKI (acute kidney injury) (HCC) Active Problems:   Aspiration pneumonia (HCC)   Hyponatremia   Acute metabolic encephalopathy   Hypothyroidism   Chronic combined systolic and diastolic heart failure (HCC)   Bipolar 1 disorder (HCC)   Paroxysmal atrial fibrillation (HCC)   History of seizure   Ischemic cardiomyopathy  Failure to thrive: secondary to all below. Very poor prognosis. High risk for further decline. Pt was already on comfort care only but pt's ALF sent the pt back to the hospital. Continue on comfort care    Likely AKI on CKDIIIb: etiology unclear, ddx overdiuresis vs cardiorenal vs med ADRs. Cr is labile.    Aspiration pneumonia: comfort care only    Hyponatremia: resolved    Hyperkalemia: WNL today    Acute metabolic encephalopathy: etiology unclear, hx of CVAs. CT head shows no acute intracranial abnormalities. Baseline mental status is unknown    Chronic combined systolic and diastolic heart failure: echo EF < 20%  in 3/24. Restarting home metoprolol. Very poor prognosis. Comfort care only    Hypothyroidism: continue on levothyroxine    Bipolar 1 disorder: unknown type or severity. Continue on home dose of seroquel    PAF: comfort care only    Hx of seizures: continue on keppra. Seizure precautions    Ischemic cardiomyopathy: echo 03/24  EF < 20%. Comfort care only     Thrombocytopenia: etiology unclear. No more labs as pt is comfort care only   Discharge Instructions  Discharge Instructions     Diet general   Complete by: As directed    Dysphagia I diet as tolerated   Discharge instructions   Complete by: As directed    F/u w/ hospice provider as soon as possible   Increase activity slowly   Complete by: As directed       Allergies as of 11/11/2022       Reactions   Iodinated Contrast Media Shortness Of Breath   Penicillins Anaphylaxis, Shortness Of Breath   Respiratory  Tolerated cefuroxime on 01/06/16   Strawberry Extract Anaphylaxis   Cefepime Itching   Empiric antibiotic, developed pruritis.    Erythromycin Itching   Sulfa Antibiotics Itching, Nausea And Vomiting, Nausea Only        Medication List     STOP taking these medications    Eliquis 5 MG Tabs tablet Generic drug: apixaban   hydrOXYzine 10 MG tablet Commonly known as: ATARAX       TAKE these medications    acetaminophen 500 MG tablet Commonly known as: TYLENOL Take 500 mg by mouth every 6 (six) hours as needed for mild pain.   albuterol 108 (90 Base) MCG/ACT inhaler Commonly known as: VENTOLIN HFA Inhale 2 puffs into the lungs every 6 (six) hours as needed for wheezing.   albuterol (2.5 MG/3ML) 0.083% nebulizer solution Commonly known as: PROVENTIL Take 3 mLs (2.5 mg total) by nebulization every 6 (six) hours as needed for wheezing or shortness of breath.   amiodarone 400 MG tablet Commonly known as: PACERONE Take 400 mg by mouth daily.   Chloraseptic 1.4 % Liqd Generic drug: phenol Use as directed 1 spray in the mouth or throat as needed.   divalproex 500 MG 24 hr tablet Commonly known as: DEPAKOTE ER Take 1 tablet (500 mg total) by mouth 2 (two) times daily.   escitalopram 10 MG tablet Commonly known as: LEXAPRO Take 1 tablet (10 mg total) by mouth at bedtime.   ezetimibe 10 MG tablet Commonly known as: ZETIA Take 1 tablet (10 mg total) by  mouth daily.   HYDROcodone-acetaminophen 5-325 MG tablet Commonly known as: NORCO/VICODIN Take 1 tablet by mouth every 6 (six) hours as needed for severe pain.   Hydrocortisone Acetate 1 % Crea Apply 1 Application topically 3 (three) times daily as needed (itching).   levETIRAcetam 250 MG tablet Commonly known as: Keppra Take 1 tablet (250 mg total) by mouth 2 (two) times daily.   levothyroxine 50 MCG tablet Commonly known as: SYNTHROID Take 50 mcg by mouth every morning.   losartan 25 MG tablet Commonly known as: COZAAR Take 12.5 mg by mouth daily.   metoprolol succinate 25 MG 24 hr tablet Commonly known as: TOPROL-XL Take 0.5 tablets (12.5 mg total) by mouth at bedtime.   midodrine 5 MG tablet Commonly known as: PROAMATINE Take 1 tablet (5 mg total) by mouth 3 (three) times daily with meals.   multivitamin with minerals Tabs tablet Take 1 tablet by mouth  daily.   nitroGLYCERIN 0.4 MG SL tablet Commonly known as: NITROSTAT Place 1 tablet (0.4 mg total) under the tongue every 5 (five) minutes as needed for chest pain.   omeprazole 20 MG capsule Commonly known as: PRILOSEC Take 20 mg by mouth daily.   potassium chloride 10 MEQ tablet Commonly known as: KLOR-CON M Take 4 tablets (40 mEq total) by mouth daily.   QUEtiapine 200 MG tablet Commonly known as: SEROQUEL Take 200 mg by mouth at bedtime.   rosuvastatin 10 MG tablet Commonly known as: CRESTOR Take 1 tablet (10 mg total) by mouth daily. What changed: when to take this   spironolactone 25 MG tablet Commonly known as: ALDACTONE Take 1 tablet (25 mg total) by mouth daily.   tiotropium 18 MCG inhalation capsule Commonly known as: SPIRIVA Place 18 mcg into inhaler and inhale daily.   Torsemide 60 MG Tabs Take 60 mg by mouth daily.   traMADol 50 MG tablet Commonly known as: ULTRAM Take 50 mg by mouth every 12 (twelve) hours as needed for moderate pain.        Allergies  Allergen Reactions    Iodinated Contrast Media Shortness Of Breath   Penicillins Anaphylaxis and Shortness Of Breath    Respiratory  Tolerated cefuroxime on 01/06/16   Strawberry Extract Anaphylaxis   Cefepime Itching    Empiric antibiotic, developed pruritis.    Erythromycin Itching   Sulfa Antibiotics Itching, Nausea And Vomiting and Nausea Only    Consultations: Hospice Palliative care    Procedures/Studies: CT HEAD WO CONTRAST ( )  Result Date: 11/09/2022 CLINICAL DATA:  Weakness and confusion. Mental status change of unknown cause. EXAM: CT HEAD WITHOUT CONTRAST TECHNIQUE: Contiguous axial images were obtained from the base of the skull through the vertex without intravenous contrast. RADIATION DOSE REDUCTION: This exam was performed according to the departmental dose-optimization program which includes automated exposure control, adjustment of the mA and/or kV according to patient size and/or use of iterative reconstruction technique. COMPARISON:  05/22/2022 FINDINGS: Brain: No apparent change. Generalized atrophy. Old infarction in the left temporal and parietal lobe consistent with inferior division left MCA infarction. Chronic small-vessel ischemic changes elsewhere within the hemispheric white matter. Chronic small-vessel ischemic changes of the pons. No sign of acute infarction, mass lesion, hemorrhage, hydrocephalus or extra-axial collection. Vascular: There is atherosclerotic calcification of the major vessels at the base of the brain. Skull: No skull fracture. Sinuses/Orbits: Fluid and or retention cyst in the left maxillary sinus. Partial aeration, improved when compared to the study of November. The other sinuses are clear. Orbits negative. Other: None IMPRESSION: 1. No acute CT finding. Atrophy and chronic small-vessel ischemic changes. 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 2. Fluid and or retention cyst in the left maxillary sinus. Partial aeration, improved when compared to the study of November. Electronically  Signed   By: Paulina Fusi M.D.   On: 11/09/2022 11:09   DG Chest 1 View  Result Date: 11/08/2022 CLINICAL DATA:  Chest pain EXAM: CHEST  1 VIEW COMPARISON:  Chest x-ray 11/01/2022 FINDINGS: Left-sided ICD again noted. Prosthetic heart valve and median sternotomy wires are again seen. The heart is enlarged. There central pulmonary vascular congestion. There some minimal patchy airspace opacities in the right upper lobe. There is no pleural effusion or pneumothorax. No acute fractures are seen. IMPRESSION: 1. Cardiomegaly with central pulmonary vascular congestion. 2. Minimal patchy airspace opacities in the right upper lobe, which could be infectious or inflammatory. Electronically Signed   By: Linton Rump  Malena Edman M.D.   On: 11/08/2022 18:40   DG Chest 2 View  Result Date: 11/01/2022 CLINICAL DATA:  Chest pain EXAM: CHEST - 2 VIEW COMPARISON:  CXR 09/19/22 FINDINGS: Left-sided triple lead cardiac device in place with unchanged lead positioning. Status post median sternotomy. No pleural effusion. No pneumothorax. There is hazy opacity at the left lung base is could represent atelectasis or infection. No radiographically apparent displaced rib fractures. Visualized upper abdomen is unremarkable. Vertebral body heights are maintained. IMPRESSION: Hazy opacity at the left lung base could represent atelectasis or infection. No pleural effusion or pneumothorax. Electronically Signed   By: Lorenza Cambridge M.D.   On: 11/01/2022 13:59   CUP PACEART REMOTE DEVICE CHECK  Result Date: 11/01/2022 Scheduled remote reviewed. Normal device function.  Previously documented, CLE:  ED to hospital admission Alert for sustained VT with successful HV therapy Event occurred 4/14 @ 17:36, EGM shows onset of sustained VT, mean HR 150, ATP delivered x6 with no changes, followed by HV therapy @ 35J converting to regular AP/BiV pace.  Duration 37sec. There is one NSVT @ 11:10 and 1 monitored VT @ 17:35 HF diagnostics currently abnormal  Next remote 91 days. LA, CVRS  (Echo, Carotid, EGD, Colonoscopy, ERCP)    Subjective: Pt c/o fatigue.    Discharge Exam: Vitals:   11/11/22 0313 11/11/22 0805  BP: 106/66 107/68  Pulse: 68   Resp:    Temp: 98.8 F (37.1 C) (!) 96.6 F (35.9 C)  SpO2: 93% 100%   Vitals:   11/10/22 1931 11/10/22 2259 11/11/22 0313 11/11/22 0805  BP: 92/68 97/69 106/66 107/68  Pulse: 60 (!) 59 68   Resp:      Temp: 98.9 F (37.2 C) 98 F (36.7 C) 98.8 F (37.1 C) (!) 96.6 F (35.9 C)  TempSrc: Oral   Axillary  SpO2: 99% 94% 93% 100%  Weight:      Height:        General: Pt is alert, awake, not in acute distress Cardiovascular:  S1/S2 +, no rubs, no gallops Respiratory: diminished breath sounds b/l  Abdominal: Soft, NT, ND, bowel sounds + Extremities:  no cyanosis    The results of significant diagnostics from this hospitalization (including imaging, microbiology, ancillary and laboratory) are listed below for reference.     Microbiology: No results found for this or any previous visit (from the past 240 hour(s)).   Labs: BNP (last 3 results) Recent Labs    08/07/22 0115 09/19/22 1848 11/08/22 1822  BNP 846.9* 1,582.0* 3,178.9*   Basic Metabolic Panel: Recent Labs  Lab 11/08/22 1811 11/08/22 1822 11/09/22 0338 11/09/22 1502 11/10/22 0347 11/11/22 0357  NA 128*  --  130*  --  136 139  K 5.0  --  5.5* 4.6 5.5* 4.4  CL 93*  --  100  --  103 103  CO2 19*  --  18*  --  16* 21*  GLUCOSE 119*  --  112*  --  97 108*  BUN 73*  --  75*  --  81* 98*  CREATININE 3.83*  --  3.75*  --  3.68* 3.10*  CALCIUM 9.6  --  8.9  --  9.5 9.3  MG  --  2.5*  --   --   --   --    Liver Function Tests: Recent Labs  Lab 11/08/22 1822  AST 54*  ALT 31  ALKPHOS 114  BILITOT 2.1*  PROT 7.5  ALBUMIN 3.6   No  results for input(s): "LIPASE", "AMYLASE" in the last 168 hours. Recent Labs  Lab 11/09/22 0009  AMMONIA 65*   CBC: Recent Labs  Lab 11/08/22 1811 11/09/22 0338  11/10/22 0347 11/11/22 0357  WBC 5.6 5.3 8.6 9.3  HGB 13.5 14.0 14.4 13.8  HCT 40.0 42.5 44.7 41.6  MCV 86.2 88.7 90.7 87.9  PLT 162 144* 120* 116*   Cardiac Enzymes: Recent Labs  Lab 11/09/22 0544  CKTOTAL 276   BNP: Invalid input(s): "POCBNP" CBG: Recent Labs  Lab 11/10/22 1942 11/10/22 2307 11/11/22 0339 11/11/22 0857 11/11/22 1157  GLUCAP 141* 110* 113* 84 114*   D-Dimer No results for input(s): "DDIMER" in the last 72 hours. Hgb A1c No results for input(s): "HGBA1C" in the last 72 hours. Lipid Profile No results for input(s): "CHOL", "HDL", "LDLCALC", "TRIG", "CHOLHDL", "LDLDIRECT" in the last 72 hours. Thyroid function studies Recent Labs    11/09/22 0338  TSH 27.124*   Anemia work up No results for input(s): "VITAMINB12", "FOLATE", "FERRITIN", "TIBC", "IRON", "RETICCTPCT" in the last 72 hours. Urinalysis    Component Value Date/Time   COLORURINE YELLOW (A) 11/08/2022 1904   APPEARANCEUR HAZY (A) 11/08/2022 1904   LABSPEC 1.011 11/08/2022 1904   PHURINE 5.0 11/08/2022 1904   GLUCOSEU NEGATIVE 11/08/2022 1904   HGBUR LARGE (A) 11/08/2022 1904   BILIRUBINUR NEGATIVE 11/08/2022 1904   KETONESUR NEGATIVE 11/08/2022 1904   PROTEINUR NEGATIVE 11/08/2022 1904   NITRITE NEGATIVE 11/08/2022 1904   LEUKOCYTESUR TRACE (A) 11/08/2022 1904   Sepsis Labs Recent Labs  Lab 11/08/22 1811 11/09/22 0338 11/10/22 0347 11/11/22 0357  WBC 5.6 5.3 8.6 9.3   Microbiology No results found for this or any previous visit (from the past 240 hour(s)).   Time coordinating discharge: Over 30 minutes  SIGNED:   Charise Killian, MD  [facility] 11/11/2022, 4:07 PM Pager   If 7PM-7AM, please contact night-coverage [email or web address]

## 2022-11-11 NOTE — Progress Notes (Signed)
Clinical/Bedside Swallow Evaluation Patient Details  Name: Nadav Bazurto MRN: [id number] Date of Birth: 10/03/1960  Today's Date: 11/10/2022 Time: SLP Start Time (ACUTE ONLY): 0940 SLP Stop Time (ACUTE ONLY): 0955 SLP Time Calculation (min) (ACUTE ONLY): 15 min  Past Medical History:  Past Medical History:  Diagnosis Date   Acute pulmonary edema (HCC) 2017   Acute respiratory failure with hypoxia (HCC) 2017   Bipolar 1 disorder (HCC)    CAD (coronary artery disease)    a. Prior RCA stenting; b. 2017 s/p CABG x 2 (LIMA->D1, VG->OM2); b. 04/2022 Demand Isch/Cath: LM mild dzs, LAD 17m, D1 100, LCX mod diff dzs, OM2 100, OM3 50, RCA 40/20p ISR, 85d (iFR 0.99), 50d, RPAV 60, LIMA->D1 small, patent, VG->OM2 large, mild dzs-->Med Rx.   Chronic HFrEF (heart failure with reduced ejection fraction) (HCC)    a. 02/2022 Echo: EF 25-30%, mild conc LVH, nl RV fxn, sev dil LA, mildly dil RA, mild MR/AI.   CKD (chronic kidney disease), stage III (HCC)    COPD (chronic obstructive pulmonary disease) (HCC)    Depression    Hypertension    Ischemic cardiomyopathy    a. 04/2020 s/p MDT MRI compatible CRT-D (Ser # [id number]); b. 02/2022 Echo: EF 25-30%.   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   Seizures (HCC)    childhood   Stroke [facility])    Tobacco abuse    Past Surgical History:  Past Surgical History:  Procedure Laterality Date   BIV UPGRADE N/A 05/05/2020   Procedure: BIV ICD UPGRADE;  Surgeon: Duke Salvia, MD;  [facility] INVASIVE CV LAB;  Service: Cardiovascular;  Laterality: N/A;   CORONARY ARTERY BYPASS GRAFT     CORONARY PRESSURE/FFR STUDY N/A 04/25/2022   Procedure: INTRAVASCULAR PRESSURE WIRE/FFR STUDY;  Surgeon: Yvonne Kendall, MD;  Location: [facility] INVASIVE CV LAB;  Service: Cardiovascular;  Laterality: N/A;   RIGHT/LEFT HEART CATH AND CORONARY/GRAFT ANGIOGRAPHY N/A  04/25/2022   Procedure: RIGHT/LEFT HEART CATH AND CORONARY/GRAFT ANGIOGRAPHY;  Surgeon: Yvonne Kendall, MD;  Location: [facility] INVASIVE CV LAB;  Service: Cardiovascular;  Laterality: N/A;   HPI:  Per H&P "Saket Papini is a 62 y.o. male with medical history significant for bipolar disorder, diabetes mellitus with complications of stage III chronic kidney disease, coronary artery disease status post CABG status post stent angioplasty, ischemic cardiomyopathy status post AICD insertion, history of chronic combined systolic and diastolic dysfunction CHF with last known LVEF of 20 percent from a 2D echocardiogram which was done 03/24 , depression, history of paroxysmal A-fib, seizure disorder who was sent to the ER from the group home where he resides for evaluation of slow ventricular tachycardia." Head CT 11/09/22, "1. No acute CT finding. Atrophy and chronic small-vessel ischemic  changes. 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  2. Fluid and or retention cyst in the left maxillary sinus. Partial  aeration, improved when compared to the study of November."  CXR 11/08/22 "1. Cardiomegaly with central pulmonary vascular congestion.  2. Minimal patchy airspace opacities in the right upper lobe, which  could be infectious or inflammatory."    Assessment / Plan / Recommendation  Clinical Impression  Pt seen for clinical swallowing evaluation. Pt alert. Slow to respond. Speech tangential, slow, and imprecise at times. Confusion evident. On 4L/min O2 via [place]. Cleared to RN. Oral motor examination completed and significant for poor dentition with several missing/broken teeth and dried, black secretions on  lingual body as well diffuse generally reduced oral-motor strength. Oral care provided. Pt given trials of ice, thin liquids (via straw), puree, and solid. Pt with inconsistent ability to feed self due to UE weakness/tremors. Pt presents with s/sx moderate oral dysphagia c/b prolonged, inefficient mastication of solids. Oral phase was  functional across other trials. No overt s/sx pharyngeal dysphagia across all trials. However, pt is at increased risk for aspiration/aspiration PNA given dental status, mental status, dependenced for feeding at present, multiple medical comorbidities, respiratory status, and hx of stroke. Recommend initiation of puree diet with thin liquids with safe swallowing strategies/aspiration precautions as outlined below. SLP to f/u per POC for diet tolerance. Pt and RN made aware of results, recommendations, and SLP POC. ?full understanding by pt. Reinforcement of content likely needed. SLP Visit Diagnosis: Dysphagia, oral phase (R13.11)    Aspiration Risk  Mild aspiration risk    Diet Recommendation Dysphagia 1 (Puree);Thin liquid   Liquid Administration via: Spoon;Cup;Straw Medication Administration: Crushed with puree Supervision: Staff to assist with self feeding;Full supervision/cueing for compensatory strategies Compensations: Minimize environmental distractions;Slow rate;Small sips/bites    Other  Recommendations Recommended Consults:  (Palliative Consult)    Recommendations for follow up therapy are one component of a multi-disciplinary discharge planning process, led by the attending physician.  Recommendations may be updated based on patient status, additional functional criteria and insurance authorization.  Follow up Recommendations  (TBD)      Assistance Recommended at Discharge    Functional Status Assessment Patient has had a recent decline in their functional status and demonstrates the ability to make significant improvements in function in a reasonable and predictable amount of time.  Frequency and Duration min 2x/week  2 weeks       Prognosis Prognosis for improved oropharyngeal function: Fair Barriers to Reach Goals: Cognitive deficits      Swallow Study   General Date of Onset: 11/08/22 (admit date) HPI: Per H&P "Carr Mattinson is a 62 y.o. male with medical history  significant for bipolar disorder, diabetes mellitus with complications of stage III chronic kidney disease, coronary artery disease status post CABG status post stent angioplasty, ischemic cardiomyopathy status post AICD insertion, history of chronic combined systolic and diastolic dysfunction CHF with last known LVEF of 20 percent from a 2D echocardiogram which was done 03/24 , depression, history of paroxysmal A-fib, seizure disorder who was sent to the ER from the group home where he resides for evaluation of slow ventricular tachycardia." Head CT 11/09/22, "1. No acute CT finding. Atrophy and chronic small-vessel ischemic  changes. 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  2. Fluid and or retention cyst in the left maxillary sinus. Partial  aeration, improved when compared to the study of November."  CXR 11/08/22 "1. Cardiomegaly with central pulmonary vascular congestion.  2. Minimal patchy airspace opacities in the right upper lobe, which  could be infectious or inflammatory." Previous Swallow Assessment: clincial swallowing evaluation in 02/2022 recommended mech soft and thin Diet Prior to this Study: NPO Temperature Spikes Noted: No Respiratory Status: Nasal cannula (4L/min) History of Recent Intubation: No Behavior/Cognition: Alert;Confused;Distractible;Requires cueing Oral Cavity Assessment: Dry Oral Care Completed by SLP: Yes Oral Cavity - Dentition: Poor condition;Missing dentition Vision: Functional for self-feeding Self-Feeding Abilities: Needs set up;Needs assist Patient Positioning: Upright in bed Baseline Vocal Quality: Normal Volitional Cough: Strong Volitional Swallow: Able to elicit    Oral/Motor/Sensory Function     Ice Chips Ice chips: Within functional limits Presentation: Spoon Other Comments: x3   Thin Liquid  Thin Liquid: Within functional limits Presentation: Straw Other Comments: ~6 oz; single and sequential sips    Nectar Thick Nectar Thick Liquid: Not tested   Honey Thick Honey  Thick Liquid: Not tested   Puree Puree: Within functional limits Presentation: Spoon Other Comments: ~2 oz   Solid     Solid: Not tested Oral Phase Impairments: Impaired mastication Oral Phase Functional Implications: Prolonged oral transit;Impaired mastication Pharyngeal Phase Impairments:  [facility]) Other Comments: x1/2 saltine      Alessandra Bevels Yaneth Fairbairn 11/11/2022,12:17 PM

## 2022-11-11 NOTE — TOC Transition Note (Signed)
Transition of Care [facility]) - CM/SW Discharge Note   Patient Details  Name: Carl [name] MRN: [id number] Date of Birth: 03/10/1961  Transition of Care [facility]) CM/SW Contact:  Truddie Hidden, RN Phone Number: 11/11/2022, 5:14 PM   Clinical Narrative:    Spoke with Achilles Dunk, [facility].  Patient can admit back to facility today with [facility].  Attempt to reach [employer] from DSS, no answer.  EMS arrannged Face sheet and medical necessity forms printed to the floor to be added to the EMS packet. Nurse notified.  DNR signed by Dr. Soyla Murphy with DSS Guardian [name] to inform of discharge plan for today. Shanda Bumps stated she would like to speak wth her Supervisor prior to proceeding with discharge.  Hospice, MD, Nurse,and Palliative NP.  Retrieved call from Josephina Shih, She and Shanda Bumps were on call. They are reluctant for patient to discharge to group home with hospice and is requesting to speak with hospice liasion.  Kara from [employer] provided number for [employer].  EMS cancelled.  [name] advised discharge would likely occur tomorrow.  Clement notified.      Final next level of care: Group Home (Group home with hospice) Barriers to Discharge: Barriers Resolved   Patient Goals and CMS Choice      Discharge Placement                  Patient to be transferred to facility by: [facility]      Discharge Plan and Services Additional resources added to the After Visit Summary for                                       Social Determinants of Health (SDOH) Interventions SDOH Screenings   Food Insecurity: No Food Insecurity (09/20/2022)  Housing: Low Risk  (09/20/2022)  Transportation Needs: No Transportation Needs (09/20/2022)  Utilities: Not At Risk (09/20/2022)  Alcohol Screen: Low Risk  (05/15/2019)  Tobacco Use: High Risk (11/08/2022)     Readmission Risk Interventions    08/05/2022    8:36 AM 03/15/2022    2:44 PM   Readmission Risk Prevention Plan  Transportation Screening Complete   HRI or Home Care Consult  Complete  Social Work Consult for Recovery Care Planning/Counseling  Complete  Palliative Care Screening  Not Applicable  Medication Review Oceanographer) Complete Complete  PCP or Specialist appointment within 3-5 days of discharge Complete   HRI or Home Care Consult Complete   Palliative Care Screening Not Applicable   Skilled Nursing Facility Not Applicable

## 2022-11-11 NOTE — Progress Notes (Signed)
SLP Cancellation Note  Patient Details Name: Carl [name] MRN: [id number] DOB: 1960/12/10   Cancelled treatment:       Reason Eval/Treat Not Completed:  (SLP consult d/c'd by provider. SLP to sign off at this time.)  Clyde Canterbury, M.S., CCC-SLP Speech-Language Pathologist [facility] [phone] [name])  [name] 11/11/2022, 12:20 PM

## 2022-11-11 NOTE — Progress Notes (Signed)
[facility] Patient Liaison Note  [name] is a current hospice patient followed at [name] for Dx hypertensive heart and chronic kidney disease.  (related dx hx of v-tach) who was admitted to [facility] on 5.3.24 with Dx Generalized Weakness (Acute Renal failure and pneumonia too).  He was seen by his cardiologist morning of 5.3.24 and noted to be hypotensive with systolic BP 88.  They remotely downloaded his AICD information which showed he was noted to have slow V-tach.  Cardiologist called facility and told him to go to ED for further evaluation.   Per Dr. Nolon Rod Monguilod, this is a related hospital admission.  Patient is appropriate for GIP for skilled level intervention of continual cardiac monitoring with frequent skilled assessment to monitor effectiveness of cardiac medications and ongoing treatment.   Vital Signs:  T- 96.6  BP-107/68  P-68  R-20  SPO2- 100% on 2L O2 via [place]   ABNORMAL LABS:  Glucose 108 PLT 116 CO2 21, BUN 98, Creat 3.10   IV/[place] MEDICATIONS Keppra IVPB 500mg /117ml daily (history of seizures and had shaking episodes in ED) Heparin 5000 unit [place] every 8 hours   I&O Intake 740 Output Net -       Problem list:     Failure to thrive: secondary to all below. Very poor prognosis. High risk for further decline    2.  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 Cr [place] trending down slightly from day prior.  Holding losartan, aldactone & torsemide. Continue on IVFs   3.  Aspiration pneumonia:  Continue on bronchodilators. Comfort care   4.  Hyponatremia: resolved   5.  Hyperkalemia: resolved   6.  Acute metabolic encephalopathy: etiology unclear, hx of CVAs. CT head shows no acute intracranial abnormalities. Baseline mental status is unknown    7.  Chronic combined systolic and diastolic heart failure: echo EF < 20% in 3/24.restarting  home metoprolol, Comfort care    8.  Hypothyroidism: continue on levothyroxine PO   9.  Bipolar 1 disorder: starting on home dose of seroquel   10.  PAF: comfort care   11.  Hx of seizures: continue on keppra. Seizure precautions    12.  Ischemic cardiomyopathy: echo 03/24  EF < 20%.Comfort care   13.  Thrombocytopenia: No more labs- comfort care     Discharge Planning:  Ongoing- Plan to discharge back to [facility] tomorrow. Family contact:  DSS guardian [name] [phone] IDT:  Patient will continue DNR at discharge.  IDT updated and collaborating on patients hospital plan of care and discharge Goals of Care-  Patient DNR in hospital.  Norris Cross, RN Nurse liaison [phone]

## 2022-11-11 NOTE — Plan of Care (Signed)

## 2022-11-11 NOTE — Progress Notes (Addendum)
PROGRESS NOTE    Carl [name]  ZOX:[id number] DOB: [birth year] DOA: 11/08/2022 PCP: System, Provider Not In   Assessment & Plan:   Principal Problem:   AKI (acute kidney injury) (HCC) Active Problems:   Aspiration pneumonia (HCC)   Hyponatremia   Acute metabolic encephalopathy   Hypothyroidism   Chronic combined systolic and diastolic heart failure (HCC)   Bipolar 1 disorder (HCC)   Paroxysmal atrial fibrillation (HCC)   History of seizure   Ischemic cardiomyopathy  Assessment and Plan: Failure to thrive: secondary to all below. Very poor prognosis. High risk for further decline. Pt was already on comfort care only but pt's ALF sent the pt back to the hospital. 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 Cr is labile.    Aspiration pneumonia: d/c IV abxs as pt is comfort care only    Hyponatremia: resolved   Hyperkalemia: WNL today   Acute metabolic encephalopathy: etiology unclear, hx of CVAs. CT head shows no acute intracranial abnormalities. Baseline mental status is unknown    Chronic combined systolic and diastolic heart failure: echo EF < 20% in 3/24. Restarting home metoprolol. Very poor prognosis. Comfort care only    Hypothyroidism: continue on levothyroxine    Bipolar 1 disorder: unknown type or severity. Will restart home dose of seroquel    PAF: comfort care only    Hx of seizures: continue on keppra. Seizure precautions    Ischemic cardiomyopathy: echo 03/24  EF < 20%. Comfort care only   Thrombocytopenia: etiology unclear. No more labs as pt is comfort care only    DVT prophylaxis: heparin SQ Code Status:  DNR Family Communication: unable to reach legal guardian  Disposition Plan: likely d/c back to group home   Level of care: Progressive Status is: Inpatient Remains inpatient appropriate because: comfort care only     Consultants:     Procedures:  Antimicrobials:    Subjective: Pt is not able to answer questions appropriately   Objective: Vitals:   11/10/22 1931 11/10/22 2259 11/11/22 0313 11/11/22 0805  BP: 92/68 97/69 106/66 107/68  Pulse: 60 (!) 59 68   Resp:      Temp: 98.9 F (37.2 C) 98 F (36.7 C) 98.8 F (37.1 C) (!) 96.6 F (35.9 C)  TempSrc: Oral   Axillary  SpO2: 99% 94% 93% 100%  Weight:      Height:        Intake/Output Summary (Last 24 hours) at 11/11/2022 0819 Last data filed at 11/11/2022 0400 Gross per 24 hour  Intake 740 ml  Output 1000 ml  Net -260 ml   Filed Weights   11/08/22 1951 11/09/22 2322  Weight: 81.7 kg 80.4 kg    Examination:  General exam: Appears uncomfortable   Respiratory system: diminished breath sounds b/l Cardiovascular system: S1 & S2+ Gastrointestinal system: Abd is soft, NT, ND & normal bowel sounds Central nervous system: Alert and awake Psychiatry: Judgement and insight appears poor    Data Reviewed: I have personally reviewed following labs and imaging studies  CBC: Recent Labs  Lab 11/08/22 1811 11/09/22 0338 11/10/22 0347 11/11/22 0357  WBC 5.6 5.3 8.6 9.3  HGB 13.5 14.0 14.4 13.8  HCT 40.0 42.5 44.7 41.6  MCV 86.2 88.7 90.7 87.9  PLT 162 144* 120* 116*   Basic Metabolic Panel: Recent Labs  Lab 11/08/22 1811 11/08/22 1822 11/09/22 0338 11/09/22 1502 11/10/22 0347 11/11/22 0357  NA  128*  --  130*  --  136 139  K 5.0  --  5.5* 4.6 5.5* 4.4  CL 93*  --  100  --  103 103  CO2 19*  --  18*  --  16* 21*  GLUCOSE 119*  --  112*  --  97 108*  BUN 73*  --  75*  --  81* 98*  CREATININE 3.83*  --  3.75*  --  3.68* 3.10*  CALCIUM 9.6  --  8.9  --  9.5 9.3  MG  --  2.5*  --   --   --   --    GFR: Estimated Creatinine Clearance: 24.3 mL/min (A) (by C-G formula based on SCr of 3.1 mg/dL (H)). Liver Function Tests: Recent Labs  Lab 11/08/22 1822  AST 54*  ALT 31  ALKPHOS 114  BILITOT 2.1*  PROT 7.5  ALBUMIN 3.6   No  results for input(s): "LIPASE", "AMYLASE" in the last 168 hours. Recent Labs  Lab 11/09/22 0009  AMMONIA 65*   Coagulation Profile: No results for input(s): "INR", "PROTIME" in the last 168 hours. Cardiac Enzymes: Recent Labs  Lab 11/09/22 0544  CKTOTAL 276   BNP (last 3 results) No results for input(s): "PROBNP" in the last 8760 hours. HbA1C: No results for input(s): "HGBA1C" in the last 72 hours. CBG: Recent Labs  Lab 11/10/22 1236 11/10/22 1714 11/10/22 1942 11/10/22 2307 11/11/22 0339  GLUCAP 158* 165* 141* 110* 113*   Lipid Profile: No results for input(s): "CHOL", "HDL", "LDLCALC", "TRIG", "CHOLHDL", "LDLDIRECT" in the last 72 hours. Thyroid Function Tests: Recent Labs    11/09/22 0338  TSH 27.124*  FREET4 0.59*   Anemia Panel: No results for input(s): "VITAMINB12", "FOLATE", "FERRITIN", "TIBC", "IRON", "RETICCTPCT" in the last 72 hours. Sepsis Labs: Recent Labs  Lab 11/09/22 0338  PROCALCITON <0.10    No results found for this or any previous visit (from the past 240 hour(s)).       Radiology Studies: CT HEAD WO CONTRAST ( )  Result Date: 11/09/2022 CLINICAL DATA:  Weakness and confusion. Mental status change of unknown cause. EXAM: CT HEAD WITHOUT CONTRAST TECHNIQUE: Contiguous axial images were obtained from the base of the skull through the vertex without intravenous contrast. RADIATION DOSE REDUCTION: This exam was performed according to the departmental dose-optimization program which includes automated exposure control, adjustment of the mA and/or kV according to patient size and/or use of iterative reconstruction technique. COMPARISON:  05/22/2022 FINDINGS: Brain: No apparent change. Generalized atrophy. Old infarction in the left temporal and parietal lobe consistent with inferior division left MCA infarction. Chronic small-vessel ischemic changes elsewhere within the hemispheric white matter. Chronic small-vessel ischemic changes of the pons. No  sign of acute infarction, mass lesion, hemorrhage, hydrocephalus or extra-axial collection. Vascular: There is atherosclerotic calcification of the major vessels at the base of the brain. Skull: No skull fracture. Sinuses/Orbits: Fluid and or retention cyst in the left maxillary sinus. Partial aeration, improved when compared to the study of November. The other sinuses are clear. Orbits negative. Other: None IMPRESSION: 1. No acute CT finding. Atrophy and chronic small-vessel ischemic changes. 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 2. Fluid and or retention cyst in the left maxillary sinus. Partial aeration, improved when compared to the study of November. Electronically Signed   By: Paulina Fusi M.D.   On: 11/09/2022 11:09        Scheduled Meds:  heparin  5,000 Units Subcutaneous Q8H   levothyroxine  50 mcg  Intravenous Daily   metoprolol succinate  12.5 mg Oral Daily   QUEtiapine  200 mg Oral QHS   sodium zirconium cyclosilicate  10 g Oral Once   Continuous Infusions:  cefTRIAXone (ROCEPHIN)  IV Stopped (11/10/22 1810)   levETIRAcetam Stopped (11/10/22 2317)     LOS: 3 days    Time spent: 25 mins     Charise Killian, MD [facility] Pager [phone]  If 7PM-7AM, please contact night-coverage [email or web address] 11/11/2022, 8:19 AM

## 2022-11-12 NOTE — [place] FL2 (Signed)
[place] MEDICAID [id number] LEVEL OF CARE FORM     IDENTIFICATION  Patient Name: Carl [name] Birthdate: 1960-08-04 Sex: male Admission Date (Current Location): 11/08/2022  [place] and [place] Number:  Chiropodist and Address:  [facility], [address], [place], [place] [address]      Provider Number: [id number]  Attending Physician Name and Address:  Charise Killian, MD  Relative Name and Phone Number:  Josephina Shih (Legal Guardian) [phone] (Mobile)    Current Level of Care: Hospital Recommended Level of Care: Skilled Nursing Facility Prior Approval Number:    Date Approved/Denied:   PASRR Number:    Discharge Plan: SNF    Current Diagnoses: Patient Active Problem List   Diagnosis Date Noted   AKI (acute kidney injury) (HCC) 11/08/2022   Aspiration pneumonia (HCC) 11/08/2022   Hyponatremia 11/08/2022   Acute metabolic encephalopathy 11/08/2022   Persistent atrial fibrillation (HCC) 10/14/2022   LBBB (left bundle branch block) 10/14/2022   Goals of care, counseling/discussion 09/21/2022   Electrolyte abnormality 09/20/2022   Acute pulmonary edema (HCC) 09/20/2022   AICD discharge 04/27/2022   Non-ST elevation (NSTEMI) myocardial infarction [facility])    Ventricular fibrillation (HCC)    Ventricular tachycardia (HCC) 04/23/2022   Acute respiratory failure with hypoxia (HCC) 04/22/2022   Hypokalemia 04/22/2022   Demand ischemia    Type 2 diabetes mellitus with hyperglycemia (HCC) 03/25/2022   Overweight (BMI 25.0-29.9) 03/23/2022   COVID-19 virus infection 03/23/2022   Respiratory failure (HCC) 03/22/2022   Dilated cardiomyopathy (HCC)    Chest pain    COPD exacerbation (HCC) 03/14/2022   Seizure disorder (HCC) 03/14/2022   Dyspnea    Congestive heart failure (HCC) 02/15/2022   Anxiety 02/15/2022   Tobacco use 02/15/2022   Elevated partial thromboplastin time (PTT) 02/15/2022   Abdominal pain 02/15/2022   HFrEF (heart  failure with reduced ejection fraction) (HCC)    Weakness    COPD with acute exacerbation (HCC) 05/23/2021   Paroxysmal atrial fibrillation (HCC) 05/23/2021   Chronic anticoagulation 05/23/2021   Abdominal aortic aneurysm (AAA) 35 to 39 mm in diameter (HCC) 05/23/2021   Ischemic cardiomyopathy 05/22/2020   Chronic combined systolic and diastolic heart failure (HCC) 05/22/2020   HCAP (healthcare-associated pneumonia) 01/24/2020   Hyperkalemia 01/24/2020   Acute on chronic combined systolic and diastolic CHF (congestive heart failure) (HCC) 01/24/2020   History of seizure 01/24/2020   HLD (hyperlipidemia) 01/24/2020   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/18/2019   Elevated troponin 11/18/2019   Hypoxia 11/18/2019   Acute renal failure superimposed on stage 3a chronic kidney disease (HCC) 11/18/2019   CAD in native artery 05/15/2019   History of stroke 05/15/2019   Homicidal ideation 05/15/2019   Hypothyroidism 05/15/2019   Constipation 05/06/2019   Scrotal mass 05/06/2019   Abnormal urine odor 05/06/2019   Bipolar 1 disorder (HCC) 12/23/2018   Chronic pain of left knee 09/17/2018   Atypical chest pain 10/11/2017   Polypharmacy 08/05/2017   Cerebrovascular accident (CVA) due to stenosis of left middle cerebral artery (HCC) 06/26/2017   Urinary retention due to benign prostatic hyperplasia 06/22/2017   Falls 06/20/2017   Risk for falls 02/19/2017   Dyslipidemia 11/19/2016   Peripheral artery disease (HCC) 08/28/2016   Claudication (HCC) 08/09/2016   History of alcohol abuse 08/09/2016   History of drug dependence/abuse (HCC) 08/09/2016   Severe single current episode of major depressive disorder, without psychotic features (HCC) 08/09/2016   Mitral insufficiency 01/04/2016   Prediabetes 12/29/2015  Essential hypertension 12/29/2015   Fracture of left clavicle 07/13/2012    Orientation RESPIRATION BLADDER Height & Weight     Self  O2 External catheter  Weight: 80.4 kg Height:  5\' 6"  (167.6 cm)  BEHAVIORAL SYMPTOMS/MOOD NEUROLOGICAL BOWEL NUTRITION STATUS  Other (Comment) (n/a)  (n/a) Incontinent Diet (DYS 1)  AMBULATORY STATUS COMMUNICATION OF NEEDS Skin   Limited Assist Verbally  (Erythema coccyx)                       Personal Care Assistance Level of Assistance  Bathing, Feeding, Dressing Bathing Assistance: Limited assistance Feeding assistance: Limited assistance Dressing Assistance: Limited assistance Total Care Assistance: Limited assistance   Functional Limitations Info  Sight, Hearing, Speech Sight Info: Impaired Hearing Info: Impaired Speech Info: Impaired    SPECIAL CARE FACTORS FREQUENCY                       Contractures Contractures Info: Not present    Additional Factors Info  Code Status, Allergies Code Status Info: DNR Allergies Info: Iodinated Contrast Media, Penicillins, Strawberry Extract, Cefepime, Erythromycin, Sulfa Antibiotics           Current Medications (11/12/2022):  This is the current hospital active medication list Current Facility-Administered Medications  Medication Dose Route Frequency Provider Last Rate Last Admin   acetaminophen (TYLENOL) tablet 650 mg  650 mg Oral Q6H PRN Agbata, Tochukwu, MD       Or   acetaminophen (TYLENOL) suppository 650 mg  650 mg Rectal Q6H PRN Agbata, Tochukwu, MD       divalproex (DEPAKOTE ER) 24 hr tablet 500 mg  500 mg Oral Q12H Charise Killian, MD   500 mg at 11/12/22 1010   glycopyrrolate (ROBINUL) tablet 1 mg  1 mg Oral Q4H PRN Charise Killian, MD       Or   glycopyrrolate (ROBINUL) injection 0.2 mg  0.2 mg Subcutaneous Q4H PRN Charise Killian, MD       Or   glycopyrrolate (ROBINUL) injection 0.2 mg  0.2 mg Intravenous Q4H PRN Charise Killian, MD       levETIRAcetam (KEPPRA) tablet 250 mg  250 mg Oral BID Charise Killian, MD   250 mg at 11/12/22 1010   levothyroxine (SYNTHROID) tablet 50 mcg  50 mcg Oral Q0600 Charise Killian, MD   50 mcg at 11/12/22 0518   metoprolol succinate (TOPROL-XL) 24 hr tablet 12.5 mg  12.5 mg Oral Daily Charise Killian, MD   12.5 mg at 11/12/22 1010   morphine (PF) 2 MG/ML injection 2-4 mg  2-4 mg Intravenous Q30 min PRN Charise Killian, MD       ondansetron [facility]) tablet 4 mg  4 mg Oral Q6H PRN Agbata, Tochukwu, MD       Or   ondansetron (ZOFRAN) injection 4 mg  4 mg Intravenous Q6H PRN Agbata, Tochukwu, MD       polyvinyl alcohol (LIQUIFILM TEARS) 1.4 % ophthalmic solution 1 drop  1 drop Both Eyes QID PRN Charise Killian, MD       QUEtiapine (SEROQUEL) tablet 200 mg  200 mg Oral QHS Charise Killian, MD   200 mg at 11/11/22 2106     Discharge Medications: Please see discharge summary for a list of discharge medications.  Relevant Imaging Results:  Relevant Lab Results:   Additional Information SSN: [id number]  Truddie Hidden, RN

## 2022-11-12 NOTE — Progress Notes (Signed)
[facility]) hospitalized hospice patient visit  Carl [name] is a current hospice patient followed at [name] for Dx hypertensive heart and chronic kidney disease. (related dx hx of v-tach) who was admitted to [facility] on 5.3.24 with Dx Generalized Weakness (Acute Renal failure and pneumonia too). He was seen by his cardiologist morning of 5.3.24 and noted to be hypotensive with systolic BP 88. They remotely downloaded his AICD information which showed he was noted to have slow V-tach. Cardiologist called facility and told him to go to ED for further evaluation. Per Dr. Nolon Rod Monguilod, this is a related hospital admission.   Current plan is to seek skilled level care with hospice services to follow. He did not have any complaints during visit today. He remains GIP appropriate with need for skilled level cardiac monitoring.   Vital Signs- 97.4/82/16    116/73     97% room air Intake/Output- 0/1450 Abnormal labs- None new Diagnostics-  None new  IV/PRN Meds- Keppra 500mg  IVPB x1, Rocephin 2g x1  Problem List-  AKI (acute kidney injury) (HCC) Active Problems:   Aspiration pneumonia (HCC)   Hyponatremia   Acute metabolic encephalopathy   Hypothyroidism   Chronic combined systolic and diastolic heart failure (HCC)   Bipolar 1 disorder (HCC)   Paroxysmal atrial fibrillation (HCC)   History of seizure   Ischemic cardiomyopathy  Discharge Planning- Ongoing, likely d/c to SNF next 1-2 days  Family Contact- HC team in communication with DSS IDT- Updated Goals of care - DNR Thea Gist, RN, [facility] Liaison [phone]

## 2022-11-12 NOTE — TOC Progression Note (Addendum)
Transition of Care [facility]) - Progression Note    Patient Details  Name: Carl [name] MRN: [id number] Date of Birth: May 17, 1961  Transition of [facility]) CM/SW Contact  Truddie Hidden, RN Phone Number: 11/12/2022, 10:23 AM  Clinical Narrative:    Sherron Monday with Misty, liaison from [facility]. Misty stated per email received [name] Gobbel from DSS is requesting FL2.  Attempt to reach [name] at [phone] to confirm discharge plan and confirm where to send FL2. Message left.   Spoke with Leida Lauth from DSS. FL2 being requested to be sent to [phone].  DSS is requesting hospice services at a SNF. Shanda Bumps was advised unless patient is a LTC resident hospice would not be able to render services at the facility due to CMS guidelines. Shanda Bumps will speak with her supervisor.   12:00pm Spoke with Shanda Bumps from DSS. Request for referral to be sent to all SNF's in [facility]. Referrals sent to [facility], [place], [place], [place] [address], [address] [place] and [place].      Barriers to Discharge: Barriers Resolved  Expected Discharge Plan and Services         Expected Discharge Date: 11/11/22                                     Social Determinants of Health (SDOH) Interventions SDOH Screenings   Food Insecurity: No Food Insecurity (09/20/2022)  Housing: Low Risk  (09/20/2022)  Transportation Needs: No Transportation Needs (09/20/2022)  Utilities: Not At Risk (09/20/2022)  Alcohol Screen: Low Risk  (05/15/2019)  Tobacco Use: High Risk (11/08/2022)    Readmission Risk Interventions    08/05/2022    8:36 AM 03/15/2022    2:44 PM  Readmission Risk Prevention Plan  Transportation Screening Complete   HRI or Home Care Consult  Complete  Social Work Consult for Recovery Care Planning/Counseling  Complete  Palliative Care Screening  Not Applicable  Medication Review Oceanographer) Complete Complete  PCP or Specialist appointment within 3-5 days of discharge Complete   HRI or  Home Care Consult Complete   Palliative Care Screening Not Applicable   Skilled Nursing Facility Not Applicable

## 2022-11-13 ENCOUNTER — Encounter: Payer: Self-pay | Admitting: Internal Medicine

## 2022-11-13 DIAGNOSIS — N179 Acute kidney failure, unspecified: Secondary | ICD-10-CM | POA: Diagnosis not present

## 2022-11-13 NOTE — Telephone Encounter (Signed)
Scheduled to be seen

## 2022-11-13 NOTE — Plan of Care (Signed)
  Problem: Pain Managment: Goal: General experience of comfort will improve Outcome: Progressing   Problem: Safety: Goal: Ability to remain free from injury will improve Outcome: Progressing   

## 2022-11-13 NOTE — TOC Progression Note (Signed)
Transition of Care [facility]) - Progression Note    Patient Details  Name: Carl [name] MRN: [id number] Date of Birth: [birth year]  Transition of Care [facility]) CM/SW Contact  Truddie Hidden, RN Phone Number: 11/13/2022, 2:30 PM  Clinical Narrative:    Sherron Monday with Thea Gist from [facility] to update on disposition.  Spoke with [name] from DSS to update on disposition. DSS wants to pursue SNF but is not oppsed to ALF if SNF bed is not obtained. Shanda Bumps advised no bed offers have been given. She is requesting bed search be extended to [facility] and [facility].  Bed search extended.     Barriers to Discharge: Barriers Resolved  Expected Discharge Plan and Services         Expected Discharge Date: 11/11/22                                     Social Determinants of Health (SDOH) Interventions SDOH Screenings   Food Insecurity: No Food Insecurity (09/20/2022)  Housing: Low Risk  (09/20/2022)  Transportation Needs: No Transportation Needs (09/20/2022)  Utilities: Not At Risk (09/20/2022)  Alcohol Screen: Low Risk  (05/15/2019)  Tobacco Use: High Risk (11/13/2022)    Readmission Risk Interventions    08/05/2022    8:36 AM 03/15/2022    2:44 PM  Readmission Risk Prevention Plan  Transportation Screening Complete   HRI or Home Care Consult  Complete  Social Work Consult for Recovery Care Planning/Counseling  Complete  Palliative Care Screening  Not Applicable  Medication Review Oceanographer) Complete Complete  PCP or Specialist appointment within 3-5 days of discharge Complete   HRI or Home Care Consult Complete   Palliative Care Screening Not Applicable   Skilled Nursing Facility Not Applicable

## 2022-11-13 NOTE — Progress Notes (Signed)
Progress Note    Carl [name]  ZOX:[id number] DOB: January 21, 1961  DOA: 11/08/2022 PCP: System, Provider Not In      Brief Narrative:    Medical records reviewed and are as summarized below:  Carl [name] is a 62 y.o. male  with medical history significant for bipolar disorder, diabetes mellitus with complications of stage III chronic kidney disease, coronary artery disease status post CABG status post stent angioplasty, ischemic cardiomyopathy status post AICD insertion, history of chronic combined systolic and diastolic dysfunction CHF with last known LVEF of 20 percent from a 2D echocardiogram which was done 03/24 , depression, history of paroxysmal A-fib, seizure disorder who was sent to the ER from the group home where he resides for evaluation of slow ventricular tachycardia. Patient was seen at the cardiology clinic on the day of admission and was noted to be hypotensive with systolic blood pressure of 88.  He was noted to be oriented only to person.  His device was remotely downloaded and patient was advised to go to the ER because he was noted to have slow ventricular tachycardia. Upon arrival to the ER he was noted to be very lethargic and oriented only to person, not to place or time. I am unable to do review of systems on this patient due to his altered mental status. Vital signs show a blood pressure of 76/60, tachycardic with heart rate of 103 Abnormal labs included BNP of 3178, sodium 128, creatinine 3.83 above a baseline of 2.30, troponin is flat 72 >> 76     Assessment/Plan:   Principal Problem:   AKI (acute kidney injury) (HCC) Active Problems:   Aspiration pneumonia (HCC)   Hyponatremia   Acute metabolic encephalopathy   Hypothyroidism   Chronic combined systolic and diastolic heart failure (HCC)   Bipolar 1 disorder (HCC)   Paroxysmal atrial fibrillation (HCC)   History of seizure   Ischemic cardiomyopathy EF less than 20% on 2D echo in March 2024  Body  mass index is 28.61 kg/m.    PLAN  Continue comfort measures   Diet Order             DIET - DYS 1 Room service appropriate? No; Fluid consistency: Thin  Diet effective now           Diet general                            Consultants: None  Procedures: None    Medications:    divalproex  500 mg Oral Q12H   levETIRAcetam  250 mg Oral BID   levothyroxine  50 mcg Oral Q0600   metoprolol succinate  12.5 mg Oral Daily   QUEtiapine  200 mg Oral QHS   Continuous Infusions:   Anti-infectives (From admission, onward)    Start     Dose/Rate Route Frequency Ordered Stop   11/09/22 1200  cefTRIAXone (ROCEPHIN) 2 g in sodium chloride 0.9 % 100 mL IVPB  Status:  Discontinued        2 g 200 mL/hr over 30 Minutes Intravenous Daily 11/09/22 1101 11/11/22 0955   11/09/22 0400  aztreonam (AZACTAM) 1 g in sodium chloride 0.9 % 100 mL IVPB  Status:  Discontinued        1 g 200 mL/hr over 30 Minutes Intravenous Every 8 hours 11/08/22 2359 11/09/22 1059   11/08/22 2000  vancomycin (VANCOCIN) IVPB 1000 mg/200 mL premix  1,000 mg 200 mL/hr over 60 Minutes Intravenous  Once 11/08/22 1950 11/08/22 2111   11/08/22 1915  aztreonam (AZACTAM) 2 g in sodium chloride 0.9 % 100 mL IVPB        2 g 200 mL/hr over 30 Minutes Intravenous  Once 11/08/22 1903 11/08/22 2008              Family Communication/Anticipated D/C date and plan/Code Status   DVT prophylaxis: Place and maintain sequential compression device Start: 11/09/22 1613     Code Status: DNR  Family Communication: None Disposition Plan: Plan to discharge to long-term care facility on hospice   Status is: Inpatient Remains inpatient appropriate because: Awaiting placement to long-term care facility       Subjective:   Interval events noted.  He is confused and cannot provide an adequate history  Objective:    Vitals:   11/11/22 1945 11/12/22 0837 11/12/22 1933 11/13/22 0837  BP:  98/62 116/73 102/71 119/64  Pulse: 61 84 67   Resp: 14 16 16 16   Temp: (!) 96.7 F (35.9 C) (!) 97.4 F (36.3 C) 98.5 F (36.9 C) 97.9 F (36.6 C)  TempSrc: Axillary Oral    SpO2: 97% 97% 92% 94%  Weight:      Height:       No data found.   Intake/Output Summary (Last 24 hours) at 11/13/2022 1719 Last data filed at 11/12/2022 1922 Gross per 24 hour  Intake 220 ml  Output --  Net 220 ml   Filed Weights   11/08/22 1951 11/09/22 2322  Weight: 81.7 kg 80.4 kg    Exam:  GEN: NAD SKIN: Warm and dry EYES: No pallor or icterus ENT: MMM CV: RRR PULM: CTA B ABD: soft, ND, NT, +BS CNS: AAO x 1 (person), non focal EXT: No edema or tenderness      Data Reviewed:   I have personally reviewed following labs and imaging studies:  Labs: Labs show the following:   Basic Metabolic Panel: Recent Labs  Lab 11/08/22 1811 11/08/22 1822 11/09/22 0338 11/09/22 1502 11/10/22 0347 11/11/22 0357  NA 128*  --  130*  --  136 139  K 5.0  --  5.5*   < > 5.5* 4.4  CL 93*  --  100  --  103 103  CO2 19*  --  18*  --  16* 21*  GLUCOSE 119*  --  112*  --  97 108*  BUN 73*  --  75*  --  81* 98*  CREATININE 3.83*  --  3.75*  --  3.68* 3.10*  CALCIUM 9.6  --  8.9  --  9.5 9.3  MG  --  2.5*  --   --   --   --    < > = values in this interval not displayed.   GFR Estimated Creatinine Clearance: 24.3 mL/min (A) (by C-G formula based on SCr of 3.1 mg/dL (H)). Liver Function Tests: Recent Labs  Lab 11/08/22 1822  AST 54*  ALT 31  ALKPHOS 114  BILITOT 2.1*  PROT 7.5  ALBUMIN 3.6   No results for input(s): "LIPASE", "AMYLASE" in the last 168 hours. Recent Labs  Lab 11/09/22 0009  AMMONIA 65*   Coagulation profile No results for input(s): "INR", "PROTIME" in the last 168 hours.  CBC: Recent Labs  Lab 11/08/22 1811 11/09/22 0338 11/10/22 0347 11/11/22 0357  WBC 5.6 5.3 8.6 9.3  HGB 13.5 14.0 14.4 13.8  HCT 40.0 42.5 44.7  41.6  MCV 86.2 88.7 90.7 87.9  PLT 162 144*  120* 116*   Cardiac Enzymes: Recent Labs  Lab 11/09/22 0544  CKTOTAL 276   BNP (last 3 results) No results for input(s): "PROBNP" in the last 8760 hours. CBG: Recent Labs  Lab 11/10/22 1942 11/10/22 2307 11/11/22 0339 11/11/22 0857 11/11/22 1157  GLUCAP 141* 110* 113* 84 114*   D-Dimer: No results for input(s): "DDIMER" in the last 72 hours. Hgb A1c: No results for input(s): "HGBA1C" in the last 72 hours. Lipid Profile: No results for input(s): "CHOL", "HDL", "LDLCALC", "TRIG", "CHOLHDL", "LDLDIRECT" in the last 72 hours. Thyroid function studies: No results for input(s): "TSH", "T4TOTAL", "T3FREE", "THYROIDAB" in the last 72 hours.  Invalid input(s): "FREET3" Anemia work up: No results for input(s): "VITAMINB12", "FOLATE", "FERRITIN", "TIBC", "IRON", "RETICCTPCT" in the last 72 hours. Sepsis Labs: Recent Labs  Lab 11/08/22 1811 11/09/22 0338 11/10/22 0347 11/11/22 0357  PROCALCITON  --  <0.10  --   --   WBC 5.6 5.3 8.6 9.3    Microbiology No results found for this or any previous visit (from the past 240 hour(s)).  Procedures and diagnostic studies:  No results found.             LOS: 5 days   Carl [name]  [facility]   Pager on [email or web address]. If 7PM-7AM, please contact night-coverage at [email or web address]     11/13/2022, 5:19 PM

## 2022-11-13 NOTE — Progress Notes (Signed)
[facility]) hospitalized hospice patient visit  [name] is a current hospice patient followed at [name] for Dx hypertensive heart and chronic kidney disease. (related dx hx of v-tach) who was admitted to [facility] on 5.3.24 with Dx Generalized Weakness (Acute Renal failure and pneumonia too). He was seen by his cardiologist morning of 5.3.24 and noted to be hypotensive with systolic BP 88. They remotely downloaded his AICD information which showed he was noted to have slow V-tach. Cardiologist called facility and told him to go to ED for further evaluation. Per Dr. Nolon Rod [name], this is a related hospital admission.   Patient lying in bed resting watching tv upon visit today. Per report of bedside nurse he isn't really eating anything but has been drinking a lot of juice, tea, water, etc. He does not offer any complaints but also does not carry on conversation with this nurse only answering no to if he's in pain and then turns back to watch tv.   He remains inpatient appropriate due to need for skilled observation/intervention with recent cardiac event.  Vital Signs- 97.9/67/16     119/64    94% room air Intake/Output- Not recorded Abnormal labs- None new Diagnostics-  None new IV/PRN Meds-  Problem List-  AKI (acute kidney injury) (HCC) Active Problems:   Aspiration pneumonia (HCC)   Hyponatremia   Acute metabolic encephalopathy   Hypothyroidism   Chronic combined systolic and diastolic heart failure (HCC)   Bipolar 1 disorder (HCC)   Paroxysmal atrial fibrillation (HCC)   History of seizure   Ischemic cardiomyopathy  Discharge Planning- Ongoing, likely d/c to SNF next 1-2 days  Family Contact- PC with Anselmo Pickler, DSS supervisor IDT- Updated Goals of care - DNR Thea Gist, RN, [facility] Liaison [phone]

## 2022-11-14 ENCOUNTER — Encounter: Payer: Self-pay | Admitting: Internal Medicine

## 2022-11-14 ENCOUNTER — Ambulatory Visit: Payer: Medicaid Other | Attending: Internal Medicine | Admitting: Internal Medicine

## 2022-11-14 DIAGNOSIS — I5042 Chronic combined systolic (congestive) and diastolic (congestive) heart failure: Secondary | ICD-10-CM

## 2022-11-14 DIAGNOSIS — I5020 Unspecified systolic (congestive) heart failure: Secondary | ICD-10-CM

## 2022-11-14 DIAGNOSIS — N179 Acute kidney failure, unspecified: Secondary | ICD-10-CM | POA: Diagnosis not present

## 2022-11-14 DIAGNOSIS — Z95810 Presence of automatic (implantable) cardiac defibrillator: Secondary | ICD-10-CM

## 2022-11-14 DIAGNOSIS — I472 Ventricular tachycardia, unspecified: Secondary | ICD-10-CM

## 2022-11-14 DIAGNOSIS — I447 Left bundle-branch block, unspecified: Secondary | ICD-10-CM

## 2022-11-14 DIAGNOSIS — I255 Ischemic cardiomyopathy: Secondary | ICD-10-CM

## 2022-11-14 DIAGNOSIS — I4819 Other persistent atrial fibrillation: Secondary | ICD-10-CM

## 2022-11-14 MED ORDER — MORPHINE SULFATE (CONCENTRATE) 10 MG/0.5ML PO SOLN: 10.0000 mg | ORAL | Status: DC | PRN

## 2022-11-14 NOTE — Progress Notes (Signed)
Progress Note    Carl [name]  UEA:[id number] DOB: 10/01/1960  DOA: 11/08/2022 PCP: System, Provider Not In      Brief Narrative:    Medical records reviewed and are as summarized below:  Carl [name] is a 62 y.o. male  with medical history significant for bipolar disorder, diabetes mellitus with complications of stage III chronic kidney disease, CAD s/p CABG, s/p stent,, ischemic cardiomyopathy s/p AICD insertion, history of chronic combined systolic and diastolic dysfunction CHF with last known LVEF of 20 percent from a 2D echocardiogram which was done 03/24 , depression, history of paroxysmal A-fib, seizure disorder, history of stroke.  He was sent to the ER from the group home where he resides for evaluation of slow ventricular tachycardia. Patient was seen at the cardiology clinic on the day of admission and was noted to be hypotensive with systolic blood pressure of 88.  He was noted to be oriented only to person.  His device was remotely downloaded and patient was advised to go to the ER because his device showed slow ventricular tachycardia. Upon arrival to the ER he was noted to be very lethargic.  He was hypotensive with BP of 76/60, tachycardic with heart rate of 103 Abnormal labs included BNP of 3178, sodium 128, creatinine 3.83 above a baseline of 2.30, troponins flat 72 >> 76  He was admitted to the hospital for hypotension, acute kidney injury, hyponatremia, acute metabolic encephalopathy and aspiration pneumonia.  He was treated with IV fluids and empiric IV antibiotics.  He also had hyperkalemia that was treated.  Of note, patient was previously on hospice at the assisted living facility.  He is a ward of the state.  He was transitioned to comfort care because of overall poor prognosis.    Assessment/Plan:   Principal Problem:   AKI (acute kidney injury) (HCC) Active Problems:   Aspiration pneumonia (HCC)   Hyponatremia   Acute metabolic encephalopathy    Hypothyroidism   Chronic combined systolic and diastolic heart failure (HCC)   Bipolar 1 disorder (HCC)   Paroxysmal atrial fibrillation (HCC)   History of seizure   Ischemic cardiomyopathy EF less than 20% on 2D echo in March 2024  Body mass index is 28.61 kg/m.    PLAN  Continue comfort measures. DSS and TOC are working on placement to SNF   Diet Order             DIET - DYS 1 Room service appropriate? No; Fluid consistency: Thin  Diet effective now           Diet general                            Consultants: None  Procedures: None    Medications:    divalproex  500 mg Oral Q12H   levETIRAcetam  250 mg Oral BID   levothyroxine  50 mcg Oral Q0600   metoprolol succinate  12.5 mg Oral Daily   QUEtiapine  200 mg Oral QHS   Continuous Infusions:   Anti-infectives (From admission, onward)    Start     Dose/Rate Route Frequency Ordered Stop   11/09/22 1200  cefTRIAXone (ROCEPHIN) 2 g in sodium chloride 0.9 % 100 mL IVPB  Status:  Discontinued        2 g 200 mL/hr over 30 Minutes Intravenous Daily 11/09/22 1101 11/11/22 0955   11/09/22 0400  aztreonam (AZACTAM) 1 g in sodium chloride  0.9 % 100 mL IVPB  Status:  Discontinued        1 g 200 mL/hr over 30 Minutes Intravenous Every 8 hours 11/08/22 2359 11/09/22 1059   11/08/22 2000  vancomycin (VANCOCIN) IVPB 1000 mg/200 mL premix        1,000 mg 200 mL/hr over 60 Minutes Intravenous  Once 11/08/22 1950 11/08/22 2111   11/08/22 1915  aztreonam (AZACTAM) 2 g in sodium chloride 0.9 % 100 mL IVPB        2 g 200 mL/hr over 30 Minutes Intravenous  Once 11/08/22 1903 11/08/22 2008              Family Communication/Anticipated D/C date and plan/Code Status   DVT prophylaxis: Place and maintain sequential compression device Start: 11/09/22 1613     Code Status: DNR  Family Communication: None Disposition Plan: Plan to discharge to long-term care facility on hospice   Status is:  Inpatient Remains inpatient appropriate because: Awaiting placement to long-term care facility       Subjective:   Interval events noted.  He is unable to provide any history Objective:    Vitals:   11/12/22 1933 11/13/22 0837 11/13/22 1929 11/14/22 0822  BP: 102/71 119/64 95/68 101/68  Pulse: 67  65 66  Resp: 16 16 16 18   Temp: 98.5 F (36.9 C) 97.9 F (36.6 C) 98.1 F (36.7 C) 97.8 F (36.6 C)  TempSrc:      SpO2: 92% 94% 99% 98%  Weight:      Height:       No data found.   Intake/Output Summary (Last 24 hours) at 11/14/2022 1543 Last data filed at 11/14/2022 1152 Gross per 24 hour  Intake 340 ml  Output 750 ml  Net -410 ml   Filed Weights   11/08/22 1951 11/09/22 2322  Weight: 81.7 kg 80.4 kg    Exam:  GEN: NAD SKIN: Warm and dry EYES: No pallor or icterus ENT: MMM CV: RRR PULM: CTA B ABD: soft, ND, NT, +BS CNS: Drowsy but arousable, confused EXT: No edema or tenderness     Data Reviewed:   I have personally reviewed following labs and imaging studies:  Labs: Labs show the following:   Basic Metabolic Panel: Recent Labs  Lab 11/08/22 1811 11/08/22 1822 11/09/22 0338 11/09/22 1502 11/10/22 0347 11/11/22 0357  NA 128*  --  130*  --  136 139  K 5.0  --  5.5*   < > 5.5* 4.4  CL 93*  --  100  --  103 103  CO2 19*  --  18*  --  16* 21*  GLUCOSE 119*  --  112*  --  97 108*  BUN 73*  --  75*  --  81* 98*  CREATININE 3.83*  --  3.75*  --  3.68* 3.10*  CALCIUM 9.6  --  8.9  --  9.5 9.3  MG  --  2.5*  --   --   --   --    < > = values in this interval not displayed.   GFR Estimated Creatinine Clearance: 24.3 mL/min (A) (by C-G formula based on SCr of 3.1 mg/dL (H)). Liver Function Tests: Recent Labs  Lab 11/08/22 1822  AST 54*  ALT 31  ALKPHOS 114  BILITOT 2.1*  PROT 7.5  ALBUMIN 3.6   No results for input(s): "LIPASE", "AMYLASE" in the last 168 hours. Recent Labs  Lab 11/09/22 0009  AMMONIA 65*   Coagulation  profile No  results for input(s): "INR", "PROTIME" in the last 168 hours.  CBC: Recent Labs  Lab 11/08/22 1811 11/09/22 0338 11/10/22 0347 11/11/22 0357  WBC 5.6 5.3 8.6 9.3  HGB 13.5 14.0 14.4 13.8  HCT 40.0 42.5 44.7 41.6  MCV 86.2 88.7 90.7 87.9  PLT 162 144* 120* 116*   Cardiac Enzymes: Recent Labs  Lab 11/09/22 0544  CKTOTAL 276   BNP (last 3 results) No results for input(s): "PROBNP" in the last 8760 hours. CBG: Recent Labs  Lab 11/10/22 1942 11/10/22 2307 11/11/22 0339 11/11/22 0857 11/11/22 1157  GLUCAP 141* 110* 113* 84 114*   D-Dimer: No results for input(s): "DDIMER" in the last 72 hours. Hgb A1c: No results for input(s): "HGBA1C" in the last 72 hours. Lipid Profile: No results for input(s): "CHOL", "HDL", "LDLCALC", "TRIG", "CHOLHDL", "LDLDIRECT" in the last 72 hours. Thyroid function studies: No results for input(s): "TSH", "T4TOTAL", "T3FREE", "THYROIDAB" in the last 72 hours.  Invalid input(s): "FREET3" Anemia work up: No results for input(s): "VITAMINB12", "FOLATE", "FERRITIN", "TIBC", "IRON", "RETICCTPCT" in the last 72 hours. Sepsis Labs: Recent Labs  Lab 11/08/22 1811 11/09/22 0338 11/10/22 0347 11/11/22 0357  PROCALCITON  --  <0.10  --   --   WBC 5.6 5.3 8.6 9.3    Microbiology No results found for this or any previous visit (from the past 240 hour(s)).  Procedures and diagnostic studies:  No results found.             LOS: 6 days   Carl [name]  [facility]   Pager on [email or web address]. If 7PM-7AM, please contact night-coverage at [email or web address]     11/14/2022, 3:43 PM

## 2022-11-14 NOTE — Progress Notes (Signed)
Nutrition Brief Note  Chart reviewed. Pt now transitioning to comfort care.  No further nutrition interventions planned at this time.  Please re-consult as needed.   Hannah Crill W, RD, LDN, CDCES Registered Dietitian II Certified Diabetes Care and Education Specialist Please refer to AMION for RD and/or RD on-call/weekend/after hours pager   

## 2022-11-14 NOTE — TOC Progression Note (Signed)
Transition of Care [facility]) - Progression Note    Patient Details  Name: Carl [name] MRN: [id number] Date of Birth: April 10, 1961  Transition of Care [facility]) CM/SW Contact  Truddie Hidden, RN Phone Number: 11/14/2022, 12:28 PM  Clinical Narrative:    Patient does not have any bed offers. [facility]  considering. Spoke with Gavin Pound in admissions to advise patient has a DSS guardian, is for LTC and will have hospice via [facility]. She is still reviewing to determine if offer can be made.      Barriers to Discharge: Barriers Resolved  Expected Discharge Plan and Services         Expected Discharge Date: 11/11/22                                     Social Determinants of Health (SDOH) Interventions SDOH Screenings   Food Insecurity: No Food Insecurity (09/20/2022)  Housing: Low Risk  (09/20/2022)  Transportation Needs: No Transportation Needs (09/20/2022)  Utilities: Not At Risk (09/20/2022)  Alcohol Screen: Low Risk  (05/15/2019)  Tobacco Use: High Risk (11/13/2022)    Readmission Risk Interventions    08/05/2022    8:36 AM 03/15/2022    2:44 PM  Readmission Risk Prevention Plan  Transportation Screening Complete   HRI or Home Care Consult  Complete  Social Work Consult for Recovery Care Planning/Counseling  Complete  Palliative Care Screening  Not Applicable  Medication Review Oceanographer) Complete Complete  PCP or Specialist appointment within 3-5 days of discharge Complete   HRI or Home Care Consult Complete   Palliative Care Screening Not Applicable   Skilled Nursing Facility Not Applicable

## 2022-11-14 NOTE — Plan of Care (Signed)

## 2022-11-14 NOTE — Progress Notes (Signed)
[facility]) hospitalized hospice patient visit  [name] is a current hospice patient followed at [name] for Dx hypertensive heart and chronic kidney disease. (related dx hx of v-tach) who was admitted to [facility] on 5.3.24 with Dx Generalized Weakness (Acute Renal failure and pneumonia too). He was seen by his cardiologist morning of 5.3.24 and noted to be hypotensive with systolic BP 88. They remotely downloaded his AICD information which showed he was noted to have slow V-tach. Cardiologist called facility and told him to go to ED for further evaluation. Per Dr. Nolon Rod Monguilod, this is a related hospital admission.   No change in patient status, TOC/DSS continue to work on placement options. He remains inpatient appropriate due to need for skilled observation/intervention with recent cardiac event.  Vital Signs- 97.8/82/18    101/68    98% room air Intake/Output- 240/850 Abnormal labs- None new Diagnostics-  None new IV/PRN Meds-  Problem List-  AKI (acute kidney injury) (HCC) Active Problems:   Aspiration pneumonia (HCC)   Hyponatremia   Acute metabolic encephalopathy   Hypothyroidism   Chronic combined systolic and diastolic heart failure (HCC)   Bipolar 1 disorder (HCC)   Paroxysmal atrial fibrillation (HCC)   History of seizure   Ischemic cardiomyopathy  Discharge Planning- Ongoing, likely d/c to SNF next 1-2 days  Family Contact- none today, disposition still unclear IDT- Updated Goals of care - DNR Thea Gist, RN, [facility] Liaison [phone]

## 2022-11-15 DIAGNOSIS — I5042 Chronic combined systolic (congestive) and diastolic (congestive) heart failure: Secondary | ICD-10-CM | POA: Diagnosis not present

## 2022-11-15 DIAGNOSIS — N179 Acute kidney failure, unspecified: Secondary | ICD-10-CM | POA: Diagnosis not present

## 2022-11-15 MED ORDER — PHENOL 1.4 % MT LIQD
1.0000 | OROMUCOSAL | Status: DC | PRN
  Filled 2022-11-15: qty 177

## 2022-11-15 NOTE — Progress Notes (Signed)
[facility]) hospitalized hospice patient visit  [name] is a current hospice patient followed at [name] for Dx hypertensive heart and chronic kidney disease. (related dx hx of v-tach) who was admitted to [facility] on 5.3.24 with Dx Generalized Weakness (Acute Renal failure and pneumonia too). He was seen by his cardiologist morning of 5.3.24 and noted to be hypotensive with systolic BP 88. They remotely downloaded his AICD information which showed he was noted to have slow V-tach. Cardiologist called facility and told him to go to ED for further evaluation. Per Dr. Nolon Rod Monguilod, this is a related hospital admission.   No change in patient status, TOC/DSS continue to work on placement options. Patient sitting up today watching tv, requesting orange juice.   He remains inpatient appropriate due to need for skilled observation/intervention with recent cardiac event.  Vital Signs- 97.8/82/18    99/75    97% room air Intake/Output- 120/800 Abnormal labs- None new Diagnostics-  None new IV/PRN Meds- N/A Problem List-  AKI (acute kidney injury) (HCC) Active Problems:   Aspiration pneumonia (HCC)   Hyponatremia   Acute metabolic encephalopathy   Hypothyroidism   Chronic combined systolic and diastolic heart failure (HCC)   Bipolar 1 disorder (HCC)   Paroxysmal atrial fibrillation (HCC)   History of seizure   Ischemic cardiomyopathy  Discharge Planning- Ongoing, likely d/c to SNF next 1-2 days Family Contact- none today, left message for DSS guardian IDT- Updated Goals of care - DNR Thea Gist, RN, [facility] Liaison [phone]

## 2022-11-15 NOTE — TOC Progression Note (Addendum)
Transition of Care [facility]) - Progression Note    Patient Details  Name: Carl [name] MRN: [id number] Date of Birth: 10-07-1960  Transition of Care [facility]) CM/SW Contact  Allena Katz, LCSW Phone Number: 11/15/2022, 9:48 AM  Clinical Narrative:   CSW LVM with patients guardian, DSS [name] Goble. No bed offers for LTC but per previous notes from CM, [facility] able to accept patient back. CSW will follow up with [facility].   12:00PM  CSW spoke with Candace at DSS who reports she feels pt needs to go to LTC as the ALF pt is from is continually bringing pt back and forth to the hospital because they're re unable to manage his ICD. Candace reports she feels this is neccessary to move him to LTC to keep him out of the hospital.     Barriers to Discharge: Barriers Resolved  Expected Discharge Plan and Services         Expected Discharge Date: 11/11/22                                     Social Determinants of Health (SDOH) Interventions SDOH Screenings   Food Insecurity: No Food Insecurity (09/20/2022)  Housing: Low Risk  (09/20/2022)  Transportation Needs: No Transportation Needs (09/20/2022)  Utilities: Not At Risk (09/20/2022)  Alcohol Screen: Low Risk  (05/15/2019)  Tobacco Use: High Risk (11/13/2022)    Readmission Risk Interventions    08/05/2022    8:36 AM 03/15/2022    2:44 PM  Readmission Risk Prevention Plan  Transportation Screening Complete   HRI or Home Care Consult  Complete  Social Work Consult for Recovery Care Planning/Counseling  Complete  Palliative Care Screening  Not Applicable  Medication Review Oceanographer) Complete Complete  PCP or Specialist appointment within 3-5 days of discharge Complete   HRI or Home Care Consult Complete   Palliative Care Screening Not Applicable   Skilled Nursing Facility Not Applicable

## 2022-11-15 NOTE — Progress Notes (Signed)
Progress Note    Carl [name]  ZOX:[id number] DOB: August 20, 1960  DOA: 11/08/2022 PCP: System, Provider Not In      Brief Narrative:    Medical records reviewed and are as summarized below:  Carl [name] is a 62 y.o. male  with medical history significant for bipolar disorder, diabetes mellitus with complications of stage III chronic kidney disease, CAD s/p CABG, s/p stent,, ischemic cardiomyopathy s/p AICD insertion, history of chronic combined systolic and diastolic dysfunction CHF with last known LVEF of 20 percent from a 2D echocardiogram which was done 03/24 , depression, history of paroxysmal A-fib, seizure disorder, history of stroke.  He was sent to the ER from the group home where he resides for evaluation of slow ventricular tachycardia. Patient was seen at the cardiology clinic on the day of admission and was noted to be hypotensive with systolic blood pressure of 88.  He was noted to be oriented only to person.  His device was remotely downloaded and patient was advised to go to the ER because his device showed slow ventricular tachycardia. Upon arrival to the ER he was noted to be very lethargic.  He was hypotensive with BP of 76/60, tachycardic with heart rate of 103 Abnormal labs included BNP of 3178, sodium 128, creatinine 3.83 above a baseline of 2.30, troponins flat 72 >> 76  He was admitted to the hospital for hypotension, acute kidney injury, hyponatremia, acute metabolic encephalopathy and aspiration pneumonia.  He was treated with IV fluids and empiric IV antibiotics.  He also had hyperkalemia that was treated.  Of note, patient was previously on hospice at the assisted living facility.  He is a ward of the state.  He was transitioned to comfort care because of overall poor prognosis.    Assessment/Plan:   Principal Problem:   AKI (acute kidney injury) (HCC) Active Problems:   Aspiration pneumonia (HCC)   Hyponatremia   Acute metabolic encephalopathy    Hypothyroidism   Chronic combined systolic and diastolic heart failure (HCC)   History of stroke   Bipolar 1 disorder (HCC)   Paroxysmal atrial fibrillation (HCC)   History of seizure   Hyperkalemia   Ischemic cardiomyopathy EF less than 20% on 2D echo in March 2024  Body mass index is 28.61 kg/m.    PLAN  He remains on comfort care. DSS and TOC are working on placement to SNF   Diet Order             DIET - DYS 1 Room service appropriate? No; Fluid consistency: Thin  Diet effective now           Diet general                            Consultants: None  Procedures: None    Medications:    divalproex  500 mg Oral Q12H   levETIRAcetam  250 mg Oral BID   levothyroxine  50 mcg Oral Q0600   metoprolol succinate  12.5 mg Oral Daily   QUEtiapine  200 mg Oral QHS   Continuous Infusions:   Anti-infectives (From admission, onward)    Start     Dose/Rate Route Frequency Ordered Stop   11/09/22 1200  cefTRIAXone (ROCEPHIN) 2 g in sodium chloride 0.9 % 100 mL IVPB  Status:  Discontinued        2 g 200 mL/hr over 30 Minutes Intravenous Daily 11/09/22 1101 11/11/22 0955  11/09/22 0400  aztreonam (AZACTAM) 1 g in sodium chloride 0.9 % 100 mL IVPB  Status:  Discontinued        1 g 200 mL/hr over 30 Minutes Intravenous Every 8 hours 11/08/22 2359 11/09/22 1059   11/08/22 2000  vancomycin (VANCOCIN) IVPB 1000 mg/200 mL premix        1,000 mg 200 mL/hr over 60 Minutes Intravenous  Once 11/08/22 1950 11/08/22 2111   11/08/22 1915  aztreonam (AZACTAM) 2 g in sodium chloride 0.9 % 100 mL IVPB        2 g 200 mL/hr over 30 Minutes Intravenous  Once 11/08/22 1903 11/08/22 2008              Family Communication/Anticipated D/C date and plan/Code Status   DVT prophylaxis: Place and maintain sequential compression device Start: 11/09/22 1613     Code Status: DNR  Family Communication: None Disposition Plan: Plan to discharge to long-term care  facility on hospice   Status is: Inpatient Remains inpatient appropriate because: Awaiting placement to long-term care facility       Subjective:   He is unable to provide any history because of confusion  Objective:    Vitals:   11/13/22 1929 11/14/22 0822 11/14/22 1833 11/15/22 0418  BP: 95/68 101/68 (!) 89/55 99/75  Pulse: 65 66 67 65  Resp: 16 18 19 18   Temp: 98.1 F (36.7 C) 97.8 F (36.6 C) 97.9 F (36.6 C) 97.8 F (36.6 C)  TempSrc:   Oral   SpO2: 99% 98% 97% 97%  Weight:      Height:       No data found.   Intake/Output Summary (Last 24 hours) at 11/15/2022 1621 Last data filed at 11/15/2022 1034 Gross per 24 hour  Intake 120 ml  Output 1200 ml  Net -1080 ml   Filed Weights   11/08/22 1951 11/09/22 2322  Weight: 81.7 kg 80.4 kg    Exam:  GEN: NAD SKIN: Warm and dry EYES: EOMI ENT: MMM CV: RRR PULM: CTA B ABD: soft, ND, NT, +BS CNS: Alert but confused, non focal EXT: No edema or tenderness     Data Reviewed:   I have personally reviewed following labs and imaging studies:  Labs: Labs show the following:   Basic Metabolic Panel: Recent Labs  Lab 11/08/22 1811 11/08/22 1822 11/09/22 0338 11/09/22 1502 11/10/22 0347 11/11/22 0357  NA 128*  --  130*  --  136 139  K 5.0  --  5.5*   < > 5.5* 4.4  CL 93*  --  100  --  103 103  CO2 19*  --  18*  --  16* 21*  GLUCOSE 119*  --  112*  --  97 108*  BUN 73*  --  75*  --  81* 98*  CREATININE 3.83*  --  3.75*  --  3.68* 3.10*  CALCIUM 9.6  --  8.9  --  9.5 9.3  MG  --  2.5*  --   --   --   --    < > = values in this interval not displayed.   GFR Estimated Creatinine Clearance: 24.3 mL/min (A) (by C-G formula based on SCr of 3.1 mg/dL (H)). Liver Function Tests: Recent Labs  Lab 11/08/22 1822  AST 54*  ALT 31  ALKPHOS 114  BILITOT 2.1*  PROT 7.5  ALBUMIN 3.6   No results for input(s): "LIPASE", "AMYLASE" in the last 168 hours. Recent Labs  Lab 11/09/22 0009  AMMONIA 65*    Coagulation profile No results for input(s): "INR", "PROTIME" in the last 168 hours.  CBC: Recent Labs  Lab 11/08/22 1811 11/09/22 0338 11/10/22 0347 11/11/22 0357  WBC 5.6 5.3 8.6 9.3  HGB 13.5 14.0 14.4 13.8  HCT 40.0 42.5 44.7 41.6  MCV 86.2 88.7 90.7 87.9  PLT 162 144* 120* 116*   Cardiac Enzymes: Recent Labs  Lab 11/09/22 0544  CKTOTAL 276   BNP (last 3 results) No results for input(s): "PROBNP" in the last 8760 hours. CBG: Recent Labs  Lab 11/10/22 1942 11/10/22 2307 11/11/22 0339 11/11/22 0857 11/11/22 1157  GLUCAP 141* 110* 113* 84 114*   D-Dimer: No results for input(s): "DDIMER" in the last 72 hours. Hgb A1c: No results for input(s): "HGBA1C" in the last 72 hours. Lipid Profile: No results for input(s): "CHOL", "HDL", "LDLCALC", "TRIG", "CHOLHDL", "LDLDIRECT" in the last 72 hours. Thyroid function studies: No results for input(s): "TSH", "T4TOTAL", "T3FREE", "THYROIDAB" in the last 72 hours.  Invalid input(s): "FREET3" Anemia work up: No results for input(s): "VITAMINB12", "FOLATE", "FERRITIN", "TIBC", "IRON", "RETICCTPCT" in the last 72 hours. Sepsis Labs: Recent Labs  Lab 11/08/22 1811 11/09/22 0338 11/10/22 0347 11/11/22 0357  PROCALCITON  --  <0.10  --   --   WBC 5.6 5.3 8.6 9.3    Microbiology No results found for this or any previous visit (from the past 240 hour(s)).  Procedures and diagnostic studies:  No results found.             LOS: 7 days   Brinley Rosete  [facility]   Pager on [email or web address]. If 7PM-7AM, please contact night-coverage at [email or web address]     11/15/2022, 4:21 PM

## 2022-11-16 DIAGNOSIS — I5042 Chronic combined systolic (congestive) and diastolic (congestive) heart failure: Secondary | ICD-10-CM | POA: Diagnosis not present

## 2022-11-16 DIAGNOSIS — N179 Acute kidney failure, unspecified: Secondary | ICD-10-CM | POA: Diagnosis not present

## 2022-11-16 NOTE — Progress Notes (Signed)
Progress Note    Carl [name]  ZOX:[id number] DOB: [birth year]  DOA: 11/08/2022 PCP: System, Provider Not In      Brief Narrative:    Medical records reviewed and are as summarized below:  Carl [name] is a 62 y.o. male  with medical history significant for bipolar disorder, diabetes mellitus with complications of stage III chronic kidney disease, CAD s/p CABG, s/p stent,, ischemic cardiomyopathy s/p AICD insertion, history of chronic combined systolic and diastolic dysfunction CHF with last known LVEF of 20 percent from a 2D echocardiogram which was done 03/24 , depression, history of paroxysmal A-fib, seizure disorder, history of stroke.  He was sent to the ER from the group home where he resides for evaluation of slow ventricular tachycardia. Patient was seen at the cardiology clinic on the day of admission and was noted to be hypotensive with systolic blood pressure of 88.  He was noted to be oriented only to person.  His device was remotely downloaded and patient was advised to go to the ER because his device showed slow ventricular tachycardia. Upon arrival to the ER he was noted to be very lethargic.  He was hypotensive with BP of 76/60, tachycardic with heart rate of 103 Abnormal labs included BNP of 3178, sodium 128, creatinine 3.83 above a baseline of 2.30, troponins flat 72 >> 76  He was admitted to the hospital for hypotension, acute kidney injury, hyponatremia, acute metabolic encephalopathy and aspiration pneumonia.  He was treated with IV fluids and empiric IV antibiotics.  He also had hyperkalemia that was treated.  Of note, patient was previously on hospice at the assisted living facility.  He is a ward of the state.  He was transitioned to comfort care because of overall poor prognosis.    Assessment/Plan:   Principal Problem:   AKI (acute kidney injury) (HCC) Active Problems:   Aspiration pneumonia (HCC)   Hyponatremia   Acute metabolic encephalopathy    Hypothyroidism   Chronic combined systolic and diastolic heart failure (HCC)   History of stroke   Bipolar 1 disorder (HCC)   Paroxysmal atrial fibrillation (HCC)   History of seizure   Hyperkalemia   Ischemic cardiomyopathy EF less than 20% on 2D echo in March 2024  Body mass index is 28.61 kg/m.    PLAN  Continue comfort measures DSS and TOC are working on placement to SNF   Diet Order             DIET - DYS 1 Room service appropriate? No; Fluid consistency: Thin  Diet effective now           Diet general                            Consultants: None  Procedures: None    Medications:    divalproex  500 mg Oral Q12H   levETIRAcetam  250 mg Oral BID   levothyroxine  50 mcg Oral Q0600   metoprolol succinate  12.5 mg Oral Daily   QUEtiapine  200 mg Oral QHS   Continuous Infusions:   Anti-infectives (From admission, onward)    Start     Dose/Rate Route Frequency Ordered Stop   11/09/22 1200  cefTRIAXone (ROCEPHIN) 2 g in sodium chloride 0.9 % 100 mL IVPB  Status:  Discontinued        2 g 200 mL/hr over 30 Minutes Intravenous Daily 11/09/22 1101 11/11/22 0955   11/09/22 0400  aztreonam (AZACTAM) 1 g in sodium chloride 0.9 % 100 mL IVPB  Status:  Discontinued        1 g 200 mL/hr over 30 Minutes Intravenous Every 8 hours 11/08/22 2359 11/09/22 1059   11/08/22 2000  vancomycin (VANCOCIN) IVPB 1000 mg/200 mL premix        1,000 mg 200 mL/hr over 60 Minutes Intravenous  Once 11/08/22 1950 11/08/22 2111   11/08/22 1915  aztreonam (AZACTAM) 2 g in sodium chloride 0.9 % 100 mL IVPB        2 g 200 mL/hr over 30 Minutes Intravenous  Once 11/08/22 1903 11/08/22 2008              Family Communication/Anticipated D/C date and plan/Code Status   DVT prophylaxis: Place and maintain sequential compression device Start: 11/09/22 1613     Code Status: DNR  Family Communication: None Disposition Plan: Plan to discharge to long-term care  facility on hospice   Status is: Inpatient Remains inpatient appropriate because: Awaiting placement to long-term care facility       Subjective:   Interval events noted.  He cannot provide any history because of confusion.  Objective:    Vitals:   11/14/22 0822 11/14/22 1833 11/15/22 0418 11/16/22 0946  BP: 101/68 (!) 89/55 99/75 107/70  Pulse: 66 67 65 78  Resp: 18 19 18 18   Temp: 97.8 F (36.6 C) 97.9 F (36.6 C) 97.8 F (36.6 C) (!) 97.3 F (36.3 C)  TempSrc:  Oral    SpO2: 98% 97% 97% 94%  Weight:      Height:       No data found.   Intake/Output Summary (Last 24 hours) at 11/16/2022 1444 Last data filed at 11/16/2022 1434 Gross per 24 hour  Intake 120 ml  Output 1150 ml  Net -1030 ml   Filed Weights   11/08/22 1951 11/09/22 2322  Weight: 81.7 kg 80.4 kg    Exam:  GEN: NAD SKIN: No rash EYES: EOMI ENT: MMM CV: RRR PULM: CTA B ABD: soft, ND, NT, +BS CNS: Alert, confused, non focal EXT: No edema or tenderness      Data Reviewed:   I have personally reviewed following labs and imaging studies:  Labs: Labs show the following:   Basic Metabolic Panel: Recent Labs  Lab 11/10/22 0347 11/11/22 0357  NA 136 139  K 5.5* 4.4  CL 103 103  CO2 16* 21*  GLUCOSE 97 108*  BUN 81* 98*  CREATININE 3.68* 3.10*  CALCIUM 9.5 9.3   GFR Estimated Creatinine Clearance: 24 mL/min (A) (by C-G formula based on SCr of 3.1 mg/dL (H)). Liver Function Tests: No results for input(s): "AST", "ALT", "ALKPHOS", "BILITOT", "PROT", "ALBUMIN" in the last 168 hours.  No results for input(s): "LIPASE", "AMYLASE" in the last 168 hours. No results for input(s): "AMMONIA" in the last 168 hours.  Coagulation profile No results for input(s): "INR", "PROTIME" in the last 168 hours.  CBC: Recent Labs  Lab 11/10/22 0347 11/11/22 0357  WBC 8.6 9.3  HGB 14.4 13.8  HCT 44.7 41.6  MCV 90.7 87.9  PLT 120* 116*   Cardiac Enzymes: No results for input(s):  "CKTOTAL", "CKMB", "CKMBINDEX", "TROPONINI" in the last 168 hours.  BNP (last 3 results) No results for input(s): "PROBNP" in the last 8760 hours. CBG: Recent Labs  Lab 11/10/22 1942 11/10/22 2307 11/11/22 0339 11/11/22 0857 11/11/22 1157  GLUCAP 141* 110* 113* 84 114*   D-Dimer: No results for input(s): "  DDIMER" in the last 72 hours. Hgb A1c: No results for input(s): "HGBA1C" in the last 72 hours. Lipid Profile: No results for input(s): "CHOL", "HDL", "LDLCALC", "TRIG", "CHOLHDL", "LDLDIRECT" in the last 72 hours. Thyroid function studies: No results for input(s): "TSH", "T4TOTAL", "T3FREE", "THYROIDAB" in the last 72 hours.  Invalid input(s): "FREET3" Anemia work up: No results for input(s): "VITAMINB12", "FOLATE", "FERRITIN", "TIBC", "IRON", "RETICCTPCT" in the last 72 hours. Sepsis Labs: Recent Labs  Lab 11/10/22 0347 11/11/22 0357  WBC 8.6 9.3    Microbiology No results found for this or any previous visit (from the past 240 hour(s)).  Procedures and diagnostic studies:  No results found.             LOS: 8 days   Cielo Arias  [facility]   Pager on [email or web address]. If 7PM-7AM, please contact night-coverage at [email or web address]     11/16/2022, 2:44 PM

## 2022-11-16 NOTE — Progress Notes (Signed)
[facility]) hospitalized hospice patient visit  Mr. Chidozie Bregenzer is a current hospice patient followed at [name] for Dx hypertensive heart and chronic kidney disease. (related dx hx of v-tach) who was admitted to [facility] on 5.3.24 with Dx Generalized Weakness (Acute Renal failure and pneumonia too). He was seen by his cardiologist morning of 5.3.24 and noted to be hypotensive with systolic BP 88. They remotely downloaded his AICD information which showed he was noted to have slow V-tach. Cardiologist called facility and told him to go to ED for further evaluation. Per Dr. Nolon Rod Monguilod, this is a related hospital admission.   Patient alert and verbally responsive without complaints, continues to take fluids without difficulty. TOC continue to work on placement.   He remains inpatient appropriate due to need for skilled observation/intervention with recent cardiac event. Vital Signs- 97.3/82/18    107/70   94% room air Intake/Output- 0/550 Abnormal labs- None new Diagnostics-  None new IV/PRN Meds- N/A Problem List-  AKI (acute kidney injury) (HCC) Active Problems:   Aspiration pneumonia (HCC)   Hyponatremia   Acute metabolic encephalopathy   Hypothyroidism   Chronic combined systolic and diastolic heart failure (HCC)   Bipolar 1 disorder (HCC)   Paroxysmal atrial fibrillation (HCC)   History of seizure   Ischemic cardiomyopathy  Discharge Planning- Ongoing, likely d/c to SNF next 1-2 days  Family Contact- none today, left message for DSS guardian IDT- Updated Goals of care - DNR Thea Gist, RN, [facility] Liaison [phone]

## 2022-11-16 NOTE — TOC Progression Note (Signed)
Transition of Care [facility]) - Progression Note    Patient Details  Name: Carl [name] MRN: [id number] Date of Birth: 1960-09-02  Transition of Care [facility]) CM/SW Contact  Liliana Cline, LCSW Phone Number: 11/16/2022, 10:52 AM  Clinical Narrative:    PASRR screening started. Additional information uploaded. CURRENTLY PENDING. Patient was already listed in [place] [facility] as "[name]".     Barriers to Discharge: Barriers Resolved  Expected Discharge Plan and Services         Expected Discharge Date: 11/11/22                                     Social Determinants of Health (SDOH) Interventions SDOH Screenings   Food Insecurity: No Food Insecurity (09/20/2022)  Housing: Low Risk  (09/20/2022)  Transportation Needs: No Transportation Needs (09/20/2022)  Utilities: Not At Risk (09/20/2022)  Alcohol Screen: Low Risk  (05/15/2019)  Tobacco Use: High Risk (11/13/2022)    Readmission Risk Interventions    08/05/2022    8:36 AM 03/15/2022    2:44 PM  Readmission Risk Prevention Plan  Transportation Screening Complete   HRI or Home Care Consult  Complete  Social Work Consult for Recovery Care Planning/Counseling  Complete  Palliative Care Screening  Not Applicable  Medication Review Oceanographer) Complete Complete  PCP or Specialist appointment within 3-5 days of discharge Complete   HRI or Home Care Consult Complete   Palliative Care Screening Not Applicable   Skilled Nursing Facility Not Applicable

## 2022-11-17 DIAGNOSIS — N179 Acute kidney failure, unspecified: Secondary | ICD-10-CM | POA: Diagnosis not present

## 2022-11-17 DIAGNOSIS — I5042 Chronic combined systolic (congestive) and diastolic (congestive) heart failure: Secondary | ICD-10-CM | POA: Diagnosis not present

## 2022-11-18 ENCOUNTER — Ambulatory Visit: Payer: Medicaid Other | Admitting: Family

## 2022-11-25 ENCOUNTER — Encounter: Payer: Medicaid Other | Admitting: Family

## 2022-11-26 NOTE — Progress Notes (Signed)
Remote ICD transmission.   

## 2022-12-07 NOTE — Plan of Care (Signed)

## 2022-12-07 NOTE — Death Summary Note (Addendum)
DEATH SUMMARY   Patient Details  Name: Carl [name] MRN: [id number] DOB: 10/10/1960 NWG:[id number], Provider Not In Admission/Discharge Information   Admit Date:  2022-12-04  Date of Death: Date of Death: 2022-12-13  Time of Death: Time of Death: 1037/08/21  Length of Stay: 9   Principle Cause of death: Congestive heart failure  Hospital Diagnoses: Principal Problem:   Chronic combined systolic and diastolic heart failure (HCC) Active Problems:   AKI (acute kidney injury) (HCC)   Aspiration pneumonia (HCC)   Hyponatremia   Acute metabolic encephalopathy   Hypothyroidism   History of stroke   Bipolar 1 disorder (HCC)   Paroxysmal atrial fibrillation (HCC)   History of seizure   Hyperkalemia   Ischemic cardiomyopathy   Hospital Course:  Carl [name] was a 62 y.o. male  with medical history significant for bipolar disorder, diabetes mellitus with complications of stage III chronic kidney disease, CAD s/p CABG, s/p stent,, ischemic cardiomyopathy s/p AICD insertion, history of chronic combined systolic and diastolic dysfunction CHF with last known LVEF of 20 percent from a 2D echocardiogram which was done in March 2024 , depression, history of paroxysmal A-fib, seizure disorder, history of stroke.  He was sent to the ER from the group home where he resides for evaluation of slow ventricular tachycardia. Patient was seen at the cardiology clinic on the day of admission and was noted to be hypotensive with systolic blood pressure of 88.  He was noted to be oriented only to person.  His device was remotely downloaded and patient was advised to go to the ER because his device showed slow ventricular tachycardia. Upon arrival to the ER he was noted to be very lethargic.  He was hypotensive with BP of 76/60, tachycardic with heart rate of 103 Abnormal labs included BNP of 3,178, sodium 128, creatinine 3.83 above a baseline of 2.30, troponins flat 72 >> 76   He was admitted to the hospital for  hypotension, acute kidney injury, hyponatremia, acute metabolic encephalopathy and aspiration pneumonia.  He was treated with IV fluids and empiric IV antibiotics.  He also had hyperkalemia that was treated.   Of note, patient was previously on hospice at the assisted living facility.  He was a ward of the state.  He was transitioned to comfort care because of overall poor prognosis.  He expired on Dec 13, 2022 at 10:39 AM.         Procedures: None  Consultations: None  The results of significant diagnostics from this hospitalization (including imaging, microbiology, ancillary and laboratory) are listed below for reference.   Significant Diagnostic Studies: CT HEAD WO CONTRAST ( )  Result Date: 11/09/2022 CLINICAL DATA:  Weakness and confusion. Mental status change of unknown cause. EXAM: CT HEAD WITHOUT CONTRAST TECHNIQUE: Contiguous axial images were obtained from the base of the skull through the vertex without intravenous contrast. RADIATION DOSE REDUCTION: This exam was performed according to the departmental dose-optimization program which includes automated exposure control, adjustment of the mA and/or kV according to patient size and/or use of iterative reconstruction technique. COMPARISON:  05/22/2022 FINDINGS: Brain: No apparent change. Generalized atrophy. Old infarction in the left temporal and parietal lobe consistent with inferior division left MCA infarction. Chronic small-vessel ischemic changes elsewhere within the hemispheric white matter. Chronic small-vessel ischemic changes of the pons. No sign of acute infarction, mass lesion, hemorrhage, hydrocephalus or extra-axial collection. Vascular: There is atherosclerotic calcification of the major vessels at the base of the brain. Skull: No skull fracture. Sinuses/Orbits: Fluid and  or retention cyst in the left maxillary sinus. Partial aeration, improved when compared to the study of November. The other sinuses are clear. Orbits  negative. Other: None IMPRESSION: 1. No acute CT finding. Atrophy and chronic small-vessel ischemic changes. 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 2. Fluid and or retention cyst in the left maxillary sinus. Partial aeration, improved when compared to the study of November. Electronically Signed   By: Paulina Fusi M.D.   On: 11/09/2022 11:09   DG Chest 1 View  Result Date: 11/08/2022 CLINICAL DATA:  Chest pain EXAM: CHEST  1 VIEW COMPARISON:  Chest x-ray 11/01/2022 FINDINGS: Left-sided ICD again noted. Prosthetic heart valve and median sternotomy wires are again seen. The heart is enlarged. There central pulmonary vascular congestion. There some minimal patchy airspace opacities in the right upper lobe. There is no pleural effusion or pneumothorax. No acute fractures are seen. IMPRESSION: 1. Cardiomegaly with central pulmonary vascular congestion. 2. Minimal patchy airspace opacities in the right upper lobe, which could be infectious or inflammatory. Electronically Signed   By: Darliss Cheney M.D.   On: 11/08/2022 18:40   DG Chest 2 View  Result Date: 11/01/2022 CLINICAL DATA:  Chest pain EXAM: CHEST - 2 VIEW COMPARISON:  CXR 09/19/22 FINDINGS: Left-sided triple lead cardiac device in place with unchanged lead positioning. Status post median sternotomy. No pleural effusion. No pneumothorax. There is hazy opacity at the left lung base is could represent atelectasis or infection. No radiographically apparent displaced rib fractures. Visualized upper abdomen is unremarkable. Vertebral body heights are maintained. IMPRESSION: Hazy opacity at the left lung base could represent atelectasis or infection. No pleural effusion or pneumothorax. Electronically Signed   By: Lorenza Cambridge M.D.   On: 11/01/2022 13:59   CUP PACEART REMOTE DEVICE CHECK  Result Date: 11/01/2022 Scheduled remote reviewed. Normal device function.  Previously documented, CLE:  ED to hospital admission Alert for sustained VT with successful HV therapy Event  occurred 4/14 @ 17:36, EGM shows onset of sustained VT, mean HR 150, ATP delivered x6 with no changes, followed by HV therapy @ 35J converting to regular AP/BiV pace.  Duration 37sec. There is one NSVT @ 11:10 and 1 monitored VT @ 17:35 HF diagnostics currently abnormal Next remote 91 days. LA, CVRS   Microbiology: No results found for this or any previous visit (from the past 240 hour(s)).  Time spent: less than 30 minutes  Signed: Lurene Shadow, MD 11/19/22

## 2022-12-07 DEATH — deceased

## 2022-12-17 ENCOUNTER — Encounter: Payer: Medicaid Other | Admitting: Internal Medicine
